# Patient Record
Sex: Female | Born: 1935
Health system: Southern US, Community
[De-identification: ages and names within clinical notes are randomized; demographics above are authoritative.]

## PROBLEM LIST (undated history)

## (undated) DIAGNOSIS — M1990 Unspecified osteoarthritis, unspecified site: Secondary | ICD-10-CM

## (undated) DIAGNOSIS — E875 Hyperkalemia: Secondary | ICD-10-CM

## (undated) DIAGNOSIS — R0602 Shortness of breath: Secondary | ICD-10-CM

## (undated) DIAGNOSIS — N318 Other neuromuscular dysfunction of bladder: Secondary | ICD-10-CM

## (undated) DIAGNOSIS — I10 Essential (primary) hypertension: Secondary | ICD-10-CM

## (undated) DIAGNOSIS — B029 Zoster without complications: Secondary | ICD-10-CM

## (undated) DIAGNOSIS — R42 Dizziness and giddiness: Secondary | ICD-10-CM

## (undated) DIAGNOSIS — R32 Unspecified urinary incontinence: Secondary | ICD-10-CM

## (undated) DIAGNOSIS — K219 Gastro-esophageal reflux disease without esophagitis: Secondary | ICD-10-CM

## (undated) DIAGNOSIS — IMO0002 Reserved for concepts with insufficient information to code with codable children: Secondary | ICD-10-CM

## (undated) DIAGNOSIS — G619 Inflammatory polyneuropathy, unspecified: Secondary | ICD-10-CM

## (undated) DIAGNOSIS — M545 Low back pain, unspecified: Secondary | ICD-10-CM

## (undated) DIAGNOSIS — E079 Disorder of thyroid, unspecified: Secondary | ICD-10-CM

## (undated) DIAGNOSIS — I428 Other cardiomyopathies: Secondary | ICD-10-CM

## (undated) DIAGNOSIS — Z95810 Presence of automatic (implantable) cardiac defibrillator: Secondary | ICD-10-CM

## (undated) DIAGNOSIS — E119 Type 2 diabetes mellitus without complications: Secondary | ICD-10-CM

## (undated) DIAGNOSIS — G9050 Complex regional pain syndrome I, unspecified: Secondary | ICD-10-CM

## (undated) DIAGNOSIS — G8929 Other chronic pain: Secondary | ICD-10-CM

## (undated) DIAGNOSIS — I639 Cerebral infarction, unspecified: Secondary | ICD-10-CM

## (undated) DIAGNOSIS — E785 Hyperlipidemia, unspecified: Secondary | ICD-10-CM

## (undated) DIAGNOSIS — N183 Chronic kidney disease, stage 3 unspecified: Secondary | ICD-10-CM

## (undated) DIAGNOSIS — I447 Left bundle-branch block, unspecified: Secondary | ICD-10-CM

## (undated) DIAGNOSIS — N3281 Overactive bladder: Secondary | ICD-10-CM

## (undated) DIAGNOSIS — G622 Polyneuropathy due to other toxic agents: Secondary | ICD-10-CM

## (undated) DIAGNOSIS — G43909 Migraine, unspecified, not intractable, without status migrainosus: Secondary | ICD-10-CM

## (undated) DIAGNOSIS — R9430 Abnormal result of cardiovascular function study, unspecified: Secondary | ICD-10-CM

## (undated) DIAGNOSIS — E049 Nontoxic goiter, unspecified: Secondary | ICD-10-CM

## (undated) DIAGNOSIS — K635 Polyp of colon: Secondary | ICD-10-CM

## (undated) DIAGNOSIS — C4491 Basal cell carcinoma of skin, unspecified: Secondary | ICD-10-CM

## (undated) DIAGNOSIS — R4701 Aphasia: Secondary | ICD-10-CM

## (undated) HISTORY — DX: Hyperlipidemia, unspecified: E78.5

## (undated) HISTORY — DX: Polyp of colon: K63.5

## (undated) HISTORY — DX: Hyperkalemia: E87.5

## (undated) HISTORY — DX: Inflammatory polyneuropathy, unspecified: G61.9

## (undated) HISTORY — DX: Presence of automatic (implantable) cardiac defibrillator: Z95.810

## (undated) HISTORY — DX: Other cardiomyopathies: I42.8

## (undated) HISTORY — DX: Cerebral infarction, unspecified: I63.9

## (undated) HISTORY — DX: Unspecified osteoarthritis, unspecified site: M19.90

## (undated) HISTORY — DX: Complex regional pain syndrome I, unspecified: G90.50

## (undated) HISTORY — DX: Zoster without complications: B02.9

## (undated) HISTORY — DX: Unspecified urinary incontinence: R32

## (undated) HISTORY — DX: Disorder of thyroid, unspecified: E07.9

## (undated) HISTORY — DX: Reserved for concepts with insufficient information to code with codable children: IMO0002

## (undated) HISTORY — PX: FINGER SURGERY: SHX640

## (undated) HISTORY — DX: Polyneuropathy due to other toxic agents: G62.2

## (undated) HISTORY — DX: Gastro-esophageal reflux disease without esophagitis: K21.9

## (undated) HISTORY — PX: CATARACT EXTRACTION W/ INTRAOCULAR LENS IMPLANT: SHX1309

## (undated) HISTORY — PX: UPPER GASTROINTESTINAL ENDOSCOPY: SHX188

## (undated) HISTORY — DX: Nontoxic goiter, unspecified: E04.9

## (undated) HISTORY — DX: Essential (primary) hypertension: I10

## (undated) HISTORY — PX: CARDIAC CATHETERIZATION: SHX172

## (undated) HISTORY — DX: Other neuromuscular dysfunction of bladder: N31.8

## (undated) HISTORY — PX: BACK SURGERY: SHX140

---

## 1957-06-03 HISTORY — PX: BREAST CYST EXCISION: SHX579

## 1974-06-03 HISTORY — PX: TUBAL LIGATION: SHX77

## 1987-06-04 HISTORY — PX: KIDNEY DONATION: SHX685

## 1997-06-03 HISTORY — PX: POSTERIOR LAMINECTOMY / DECOMPRESSION CERVICAL SPINE: SUR739

## 1997-06-03 HISTORY — PX: LUMBAR LAMINECTOMY/DECOMPRESSION MICRODISCECTOMY: SHX5026

## 1998-05-11 ENCOUNTER — Ambulatory Visit (HOSPITAL_COMMUNITY): Admission: RE | Admit: 1998-05-11 | Discharge: 1998-05-11 | Payer: Self-pay | Admitting: Plastic Surgery

## 1999-05-07 ENCOUNTER — Ambulatory Visit (HOSPITAL_COMMUNITY): Admission: RE | Admit: 1999-05-07 | Discharge: 1999-05-07 | Payer: Self-pay | Admitting: *Deleted

## 1999-05-07 ENCOUNTER — Encounter: Payer: Self-pay | Admitting: *Deleted

## 2000-07-14 ENCOUNTER — Ambulatory Visit (HOSPITAL_COMMUNITY): Admission: RE | Admit: 2000-07-14 | Discharge: 2000-07-14 | Payer: Self-pay | Admitting: *Deleted

## 2000-07-14 ENCOUNTER — Encounter: Payer: Self-pay | Admitting: *Deleted

## 2001-10-01 ENCOUNTER — Ambulatory Visit (HOSPITAL_COMMUNITY): Admission: RE | Admit: 2001-10-01 | Discharge: 2001-10-01 | Payer: Self-pay | Admitting: *Deleted

## 2001-10-01 ENCOUNTER — Encounter: Payer: Self-pay | Admitting: *Deleted

## 2001-10-01 LAB — HM MAMMOGRAPHY: HM Mammogram: NORMAL

## 2009-12-01 ENCOUNTER — Encounter: Payer: Self-pay | Admitting: Endocrinology

## 2009-12-01 LAB — HM PAP SMEAR: HM Pap smear: NORMAL

## 2009-12-01 LAB — CONVERTED CEMR LAB: Pap Smear: NORMAL

## 2009-12-18 ENCOUNTER — Encounter: Payer: Self-pay | Admitting: Endocrinology

## 2010-06-15 ENCOUNTER — Encounter: Payer: Self-pay | Admitting: Endocrinology

## 2010-06-26 ENCOUNTER — Ambulatory Visit: Admit: 2010-06-26 | Payer: Self-pay | Admitting: Internal Medicine

## 2010-07-09 ENCOUNTER — Encounter: Payer: Self-pay | Admitting: Endocrinology

## 2010-07-09 ENCOUNTER — Ambulatory Visit (INDEPENDENT_AMBULATORY_CARE_PROVIDER_SITE_OTHER): Payer: Medicare Other | Admitting: Endocrinology

## 2010-07-09 DIAGNOSIS — E119 Type 2 diabetes mellitus without complications: Secondary | ICD-10-CM

## 2010-07-09 DIAGNOSIS — E1169 Type 2 diabetes mellitus with other specified complication: Secondary | ICD-10-CM | POA: Insufficient documentation

## 2010-07-09 DIAGNOSIS — N318 Other neuromuscular dysfunction of bladder: Secondary | ICD-10-CM | POA: Insufficient documentation

## 2010-07-09 DIAGNOSIS — I10 Essential (primary) hypertension: Secondary | ICD-10-CM | POA: Insufficient documentation

## 2010-07-09 DIAGNOSIS — G9050 Complex regional pain syndrome I, unspecified: Secondary | ICD-10-CM | POA: Insufficient documentation

## 2010-07-09 DIAGNOSIS — I428 Other cardiomyopathies: Secondary | ICD-10-CM | POA: Insufficient documentation

## 2010-07-09 DIAGNOSIS — E1129 Type 2 diabetes mellitus with other diabetic kidney complication: Secondary | ICD-10-CM | POA: Insufficient documentation

## 2010-07-09 DIAGNOSIS — G629 Polyneuropathy, unspecified: Secondary | ICD-10-CM | POA: Insufficient documentation

## 2010-07-09 DIAGNOSIS — E7800 Pure hypercholesterolemia, unspecified: Secondary | ICD-10-CM | POA: Insufficient documentation

## 2010-07-09 DIAGNOSIS — E875 Hyperkalemia: Secondary | ICD-10-CM | POA: Insufficient documentation

## 2010-07-19 NOTE — Assessment & Plan Note (Signed)
Summary: New [place]/Medicare/AARP/DM2  #LB   Vital Signs:  Patient profile:   76 year old female Menstrual status:  postmenopausal Height:      63 inches (160.02 cm) Weight:      164 pounds (74.55 kg) BMI:     29.16 O2 Sat:      97 % on Room air Temp:     97.9 degrees F (36.61 degrees C) oral Pulse rate:   67 / minute Pulse rhythm:   regular BP sitting:   128 / 76  (left arm) Cuff size:   large  Vitals Entered By: Rebeca Alert CMA Deborra Medina) (July 09, 2010 2:45 PM)  O2 Flow:  Room air  CC: New Endo/DMII/pt is in process of estab. with new PCP (Dr. Evalee Mutton) Is Patient Diabetic? Yes  Vision Screening:      Vision Comments: Last DM eye exam was at [facility] in 06/2010  Vision Entered By: Rebeca Alert CMA Deborra Medina) (July 09, 2010 2:54 PM)     Menstrual Status postmenopausal Last PAP Result normal   Primary Provider:  [facility]  CC:  New Endo/DMII/pt is in process of estab. with new PCP (Dr. Evalee Mutton).  History of Present Illness: pt states 15 years h/o dm.   it is complicated by peripheral sensory neuropathy.  she was been on insulin, for a brief period, a few years ago, but not since then.  she takes 2 oral agents.  no cbg record, but states cbg's vary from 110-180. pt says his diet and exercise are "fair."   symptomatically, pt states few years of moderate numbness of the hands and feet, and assoc pain.     Current Medications (verified): 1)  Metformin Hcl 500 Mg Xr24h-Tab (Metformin Hcl) .Marland Kitchen.. 1 Tablet By Mouth Four Times A Day 2)  Glimepiride 2 Mg Tabs (Glimepiride) .Marland Kitchen.. 1 Tablet By Mouth Once Daily 3)  Carvedilol 12.5 Mg Tabs (Carvedilol) .Marland Kitchen.. 1 Tablet By Mouth Two Times A Day 4)  Oxybutynin Chloride 5 Mg Tabs (Oxybutynin Chloride) .Marland Kitchen.. 1 Tablet By Mouth Once Daily 5)  Gabapentin 300 Mg Caps (Gabapentin) .Marland Kitchen.. 1 Capsule By Mouth Three Times A Day 6)  Simvastatin 40 Mg Tabs (Simvastatin) .Marland Kitchen.. 1 Tablet By Mouth Once Daily 7)  Zantac 150 Mg Tabs (Ranitidine  Hcl) .Marland Kitchen.. 1 Tablet By Mouth Once Daily 8)  Aspirin 81 Mg Tabs (Aspirin) .Marland Kitchen.. 1 Tablet By Mouth Once Daily 9)  Vitamin D3 1000 Unit Caps (Cholecalciferol) .Marland Kitchen.. 1 Capsule By Mouth Once Daily 10)  Vitamin B-12 100 Mcg Tabs (Cyanocobalamin) .Marland Kitchen.. 1 By Mouth Once Daily  Allergies (verified): 1)  ! Ace Inhibitors 2)  ! Codeine  Past History:  Past Medical History: CARDIOMYOPATHY (ICD-425.4) HYPERKALEMIA (ICD-276.7) POLYNEUROPATHY (ICD-357.9) REFLEX SYMPATHETIC DYSTROPHY (ICD-337.20) OVERACTIVE BLADDER (ICD-596.51) HYPERCHOLESTEROLEMIA (ICD-272.0) HYPERTENSION (ICD-401.9) DM (ICD-250.00) FAMILY HISTORY OF ALCOHOLISM/ADDICTION (ICD-V61.41)  Past Surgical History: Breast Surgery: Cyst-1959 Finger Surgery: 1959, 1976 Kidney Donor: 1989 Back Surgery: 1999  Family History: Reviewed history and no changes required. Family History of Alcoholism/Addiction (parent) Family History High cholesterol Family History Hypertension (parent) Family History Kidney disease Family History Lung cancer Family History Ovarian cancer (mother) Family History of Breast Cancer Family History of Diabetes (3 sibs, father, dtr)  Social History: Reviewed history and no changes required. Retired married Never Smoked Alcohol use-yes Drug use-no Regular exercise-no Smoking Status:  never Drug Use:  no Does Patient Exercise:  no  Review of Systems       denies weight loss, headache, chest pain,  sob, n/v, excessive diaphoresis, memory loss, and depression.  she has blurry vision,  but nature of opthal condition is uncertain.  she has polyuria, and leg cramps.  she has hypoglycemia approx 2/month--usually in the afternoon.  she has easy bruising and rhinorrhea.   Physical Exam  General:  normal appearance.   Head:  head: no deformity eyes: no periorbital swelling, no proptosis external nose and ears are normal mouth: no lesion seen Neck:  Supple without thyroid enlargement or tenderness.  there is a  healed surgical scar at the posterior neck Lungs:  Clear to auscultation bilaterally. Normal respiratory effort.  Heart:  Regular rate and rhythm without murmurs or gallops noted. Normal S1,S2.   Abdomen:  abdomen is soft, nontender.  no hepatosplenomegaly.   not distended.  no hernia  Msk:  muscle bulk and strength are grossly normal.  no obvious joint swelling.  gait is normal and steady  Pulses:  dorsalis pedis intact bilat.  there is a right carotid bruit (pt states this is old)  Extremities:  no deformity.  no ulcer on the feet.  feet are of normal color and temp.  no edema  Neurologic:  cn 2-12 grossly intact.   readily moves all 4's.   sensation is intact to touch on the feet, but decreased from normal.  Skin:  normal texture and temp.  no rash.  not diaphoretic  Cervical Nodes:  No significant adenopathy.  Psych:  Alert and cooperative; normal mood and affect; normal attention span and concentration.   Additional Exam:  outside test results are reviewed:  a1c=8.1   Impression & Recommendations:  Problem # 1:  DM (ICD-250.00) due to hypoglycemia, she needs some adjustment in her therapy  Problem # 2:  POLYNEUROPATHY (ICD-357.9) prob due to #1  Problem # 3:  leg cramps poss. due to #1  Medications Added to Medication List This Visit: 1)  Metformin Hcl 500 Mg Xr24h-tab (Metformin hcl) .Marland Kitchen.. 1 tablet by mouth four times a day 2)  Glimepiride 2 Mg Tabs (Glimepiride) .Marland Kitchen.. 1 tablet by mouth once daily 3)  Carvedilol 12.5 Mg Tabs (Carvedilol) .Marland Kitchen.. 1 tablet by mouth two times a day 4)  Oxybutynin Chloride 5 Mg Tabs (Oxybutynin chloride) .Marland Kitchen.. 1 tablet by mouth once daily 5)  Gabapentin 300 Mg Caps (Gabapentin) .Marland Kitchen.. 1 capsule by mouth three times a day 6)  Simvastatin 40 Mg Tabs (Simvastatin) .Marland Kitchen.. 1 tablet by mouth once daily 7)  Zantac 150 Mg Tabs (Ranitidine hcl) .Marland Kitchen.. 1 tablet by mouth once daily 8)  Aspirin 81 Mg Tabs (Aspirin) .Marland Kitchen.. 1 tablet by mouth once daily 9)  Vitamin  D3 1000 Unit Caps (Cholecalciferol) .Marland Kitchen.. 1 capsule by mouth once daily 10)  Vitamin B-12 100 Mcg Tabs (Cyanocobalamin) .Marland Kitchen.. 1 by mouth once daily  Other Orders: Est. Patient Level V [id number])  Patient Instructions: 1)  good diet and exercise habits significanly improve the control of your diabetes.  please let me know if you wish to be referred to a dietician.  high blood sugar is very risky to your health.  you should see an eye doctor every year. 2)  controlling your blood pressure and cholesterol drastically reduces the damage diabetes does to your body.  this also applies to quitting smoking.  please discuss these with your doctor.  you should take an aspirin every day, unless you have been advised by a doctor not to. 3)  check your blood sugar 1 time a day.  vary the time of  day when you check, between before the 3 meals, and at bedtime.  also check if you have symptoms of your blood sugar being too high or too low.  please keep a record of the readings and bring it to your next appointment here.  please call us sooner if you are having low blood sugar episodes. 4)  you should consider reducing glimepiride, and adding januvia and actos.  please let me know.   Orders Added: 1)  Est. Patient Level V [id number]     Preventive Care Screening  Colonoscopy:    Date:  06/04/2007    Next Due:  06/2010    Results:  done   Mammogram:    Date:  02/01/2010    Results:  normal   Pap Smear:    Date:  12/01/2009    Results:  normal    Preventive Care Screening  Colonoscopy:    Date:  06/04/2007    Next Due:  06/2010    Results:  done   Mammogram:    Date:  02/01/2010    Results:  normal   Pap Smear:    Date:  12/01/2009    Results:  normal

## 2010-08-02 HISTORY — PX: CT SCAN: SHX5351

## 2010-08-28 ENCOUNTER — Encounter: Payer: Self-pay | Admitting: Family Medicine

## 2010-08-28 ENCOUNTER — Ambulatory Visit (INDEPENDENT_AMBULATORY_CARE_PROVIDER_SITE_OTHER): Payer: Medicare Other | Admitting: Family Medicine

## 2010-08-28 DIAGNOSIS — K760 Fatty (change of) liver, not elsewhere classified: Secondary | ICD-10-CM

## 2010-08-28 DIAGNOSIS — E119 Type 2 diabetes mellitus without complications: Secondary | ICD-10-CM

## 2010-08-28 DIAGNOSIS — I428 Other cardiomyopathies: Secondary | ICD-10-CM

## 2010-08-28 DIAGNOSIS — E7800 Pure hypercholesterolemia, unspecified: Secondary | ICD-10-CM

## 2010-08-28 DIAGNOSIS — E875 Hyperkalemia: Secondary | ICD-10-CM

## 2010-08-28 DIAGNOSIS — I10 Essential (primary) hypertension: Secondary | ICD-10-CM

## 2010-08-28 DIAGNOSIS — E785 Hyperlipidemia, unspecified: Secondary | ICD-10-CM

## 2010-08-28 DIAGNOSIS — K7689 Other specified diseases of liver: Secondary | ICD-10-CM

## 2010-08-28 MED ORDER — PANTOPRAZOLE SODIUM 40 MG PO TBEC: 40.0000 mg | DELAYED_RELEASE_TABLET | Freq: Every day | ORAL | Status: DC

## 2010-08-28 MED ORDER — AMLODIPINE BESYLATE 5 MG PO TABS: 5.0000 mg | ORAL_TABLET | Freq: Every day | ORAL | Status: DC

## 2010-08-28 NOTE — Assessment & Plan Note (Signed)
Pt told this years ago  No symptoms  No elevation  In liver enzymes Disc wt loss and low fat diet

## 2010-08-28 NOTE — Assessment & Plan Note (Signed)
Intermittent in past Disc watching K in diet  Has one kidney  Has DM Re check with next labs

## 2010-08-28 NOTE — Progress Notes (Signed)
Subjective:    Patient ID: Morgan Hubbard, female    DOB: 1934-07-11, 76 y.o.   MRN: [id number]  HPI Here to est as new primary care pt  Has complex med hx  Moved here from [place] - 2 hours away - getting est   Sees Dr Loanne Drilling for DM  [facility] is still over 8  Has not been to DM teaching - but knows how to follow DM diet Not entirely compliant  Needs to loose wt and exercise     HTN very well controlled on current meds  Amlodipine was added to her medicines recently , also on carvedilol  Had to come off ace inhibitor -- true allergy and has to stay off that class of med     Hyperlipidemia - been on simvastatin - and changed to pravastatin due to amlodipine new px (cannot mix them) Has not had labs yet for chol since then  Has only been on pravastatin for about 10 days  Was well controlled on simvastatin -- LDL 86 No side eff on new meds    Cardiomyopathy--low EF (pt does not know reason)  Heart cath planned for thurs in [place]  Was at beach and went to hosp several weeks ago -- with cp and jaw pain  Did stress test and echo -- some new area of ischemia   Also borderline high K in past - will need to re check this in future  Last cath was 6 years ago --was clean   Has one kidney- donated to her brother   Had colonosc 6 years ago - showed polyps - adenomatous , then repeated 3 years later ,- told her to come back in 3 more years  She wants to do that in [place] - Dr Debarah Crape   Her last yearly check up was in July   Has hx of fatty liver -- by bx in 1989 -- when she donated kidney   Has a place to check on her abd  Old CT showed pulmonary nodule in past   Past Medical History  Diagnosis Date  . Arthritis   . Diabetes mellitus   . Headache   . GERD (gastroesophageal reflux disease)   . Ulcer   . Hyperlipidemia   . Migraine   . Colon polyps   . Thyroid disease   . Shingles   . Urine incontinence    Past Surgical History  Procedure Date  . Breast surgery 19599   breast cyst  . Finger surgery [place]  . Tubal ligation 1976  . Kidney donation 55  . Spine surgery 1999   History   Social History  . Marital Status: Married    Spouse Name: N/A    Number of Children: 2  . Years of Education: N/A   Occupational History  . retired - Production manager     Social History Main Topics  . Smoking status: Never Smoker   . Smokeless tobacco: None  . Alcohol Use: No  . Drug Use: None  . Sexually Active: None   Other Topics Concern  . None   Social History Narrative   Married 2nd timeDaughter is Surveyor, minerals     Family History  Problem Relation Age of Onset  . Arthritis Mother   . Cancer Mother     uterine and mouth  . Hyperlipidemia Mother   . Stroke Mother   . Hypertension Mother   . Alcohol abuse Father   . Cancer Sister  breast  . Diabetes Sister   . Cancer Brother     lung cancer  . Kidney disease Brother   . Diabetes Brother   . Hyperlipidemia Sister   . Heart disease Sister   . Hypertension Sister   . Diabetes Sister   . Hyperlipidemia Brother   . Kidney failure Brother       Review of Systems  Constitutional: Negative for activity change, appetite change and unexpected weight change.  HENT: Negative for nosebleeds and congestion.   Eyes: Negative for pain and visual disturbance.  Respiratory: Negative for apnea, cough, shortness of breath and wheezing.   Cardiovascular: Negative for chest pain, palpitations and leg swelling.  Gastrointestinal: Negative for abdominal pain.  Genitourinary: Negative for frequency and hematuria.  Musculoskeletal: Negative for myalgias and joint swelling.  Skin: Negative for color change and pallor.  Neurological: Negative for weakness, light-headedness and headaches.  Hematological: Negative for adenopathy. Does not bruise/bleed easily.  Psychiatric/Behavioral: Negative for dysphoric mood.       Objective:   Physical Exam  Constitutional: She appears well-developed and  well-nourished.       overwt and well appearing   HENT:  Head: Normocephalic and atraumatic.  Right Ear: External ear normal.  Left Ear: External ear normal.  Mouth/Throat: Oropharynx is clear and moist.  Eyes: Conjunctivae and EOM are normal. Pupils are equal, round, and reactive to light.  Neck: Normal range of motion. Neck supple. No JVD present. Carotid bruit is present. No thyromegaly present.  Cardiovascular: Normal rate and regular rhythm.  Exam reveals no gallop and no friction rub.   Murmur heard. Pulmonary/Chest: Effort normal and breath sounds normal. She has no rales.  Abdominal: Soft. Bowel sounds are normal. She exhibits no abdominal bruit.       Some assymetry in L side of abd under her nephrectomy scar nt No hernia appreciated   Musculoskeletal: She exhibits no edema and no tenderness.       Chronic neck pain from deg disc and prior surgery    Lymphadenopathy:    She has no cervical adenopathy.  Neurological: She is alert. She has normal reflexes. Coordination normal.  Skin: Skin is warm and dry. No rash noted.  Psychiatric: She has a normal mood and affect.          Assessment & Plan:

## 2010-08-28 NOTE — Assessment & Plan Note (Signed)
Not well controlled She will be f/u with Dr Loanne Drilling- she is interested in long acting insulin Disc imp of DM diet and wt loss

## 2010-08-28 NOTE — Assessment & Plan Note (Signed)
Newly on pravastatin Labs in 6 weeks  Disc low sat fat diet in detail

## 2010-08-28 NOTE — Patient Instructions (Signed)
Here are new px  Please make appt with Dr Loanne Drilling for diabetes after you have your heart cath  Schedule fasting labs in 6 weeks for cholesterol and potassium  We will schedule 30 minute check up in august  Don't forget to schedule your 3 year colonoscopy in [place] a diabetic diet  Eat low saturated fat diet (Avoid red meat/ fried foods/ egg yolks/ fatty breakfast meats/ butter, cheese and high fat dairy/ and shellfish  ) Start regular exercise after your heart is cleared  Work on weight loss

## 2010-08-28 NOTE — Assessment & Plan Note (Signed)
Unknown cause Heart M heard on exam Upcoming cath in [place] after episode of cp recently (now resolved)

## 2010-08-28 NOTE — Assessment & Plan Note (Signed)
Well controlled with current meds 124/66 Pt is allergic to ace - cannot have that or ARB

## 2010-08-30 ENCOUNTER — Encounter: Payer: Self-pay | Admitting: Family Medicine

## 2010-09-05 ENCOUNTER — Encounter: Payer: Self-pay | Admitting: Family Medicine

## 2010-09-07 ENCOUNTER — Encounter: Payer: Self-pay | Admitting: Family Medicine

## 2010-10-01 ENCOUNTER — Encounter: Payer: Self-pay | Admitting: Family Medicine

## 2010-10-15 ENCOUNTER — Other Ambulatory Visit (INDEPENDENT_AMBULATORY_CARE_PROVIDER_SITE_OTHER): Payer: Medicare Other | Admitting: Family Medicine

## 2010-10-15 DIAGNOSIS — E875 Hyperkalemia: Secondary | ICD-10-CM

## 2010-10-15 DIAGNOSIS — E785 Hyperlipidemia, unspecified: Secondary | ICD-10-CM

## 2010-10-15 LAB — LIPID PANEL
Cholesterol: 179 mg/dL (ref 0–200)
HDL: 44.9 mg/dL (ref >39.00)
VLDL: 29.6 mg/dL (ref 0.0–40.0)

## 2010-10-18 ENCOUNTER — Telehealth: Payer: Self-pay

## 2010-10-18 NOTE — Telephone Encounter (Signed)
Message copied by Ozzie Hoyle on Thu Oct 18, 2010  5:44 PM ------      Message from: [name]      Created: Tue Oct 16, 2010  7:50 AM       Cholesterol is fairly controlled with pravastatin and diet       K is high-- is she taking any K or vitamins with K ?

## 2010-10-18 NOTE — Telephone Encounter (Signed)
Left message for pt to call back  °

## 2010-10-19 NOTE — Telephone Encounter (Signed)
Patient notified as instructed by telephone. Pt said she does not take any add'l supplements and doesn't think her BMD  Vitamin has K in it. Pt has been eating a lot of bananas and greens for awhile. Pt said she had been told before her K was high and when she would stop bananas it was OK. Pt had cardiac cath 4-6 weeks ago in [place] and was told her K was not high then. Pt has a business call and will be going out of town next week, so pt said to leave any message on home v/m. Just for Dr Marliss Coots information pt does have colonoscopy and endoscopy scheduled in [place] on 12/12/10.

## 2010-10-19 NOTE — Telephone Encounter (Signed)
Left message for pt to call back  °

## 2010-10-21 NOTE — Telephone Encounter (Signed)
Hold off on bananas and avoid vitamins Re check K for hyperkalemia when she is back in town please

## 2010-10-22 NOTE — Telephone Encounter (Signed)
Patient notified as instructed by telephone. Pt not sure how long will be out of town and will call for appt on her return. Pt requested copy of labs faxed to Dr Hall Busing cardiologist [phone] fax # - done.

## 2010-11-15 ENCOUNTER — Ambulatory Visit (INDEPENDENT_AMBULATORY_CARE_PROVIDER_SITE_OTHER): Payer: Medicare Other | Admitting: Family Medicine

## 2010-11-15 ENCOUNTER — Ambulatory Visit (INDEPENDENT_AMBULATORY_CARE_PROVIDER_SITE_OTHER)
Admission: RE | Admit: 2010-11-15 | Discharge: 2010-11-15 | Disposition: A | Discharge status: Home / Self Care | Payer: Medicare Other | Source: Ambulatory Visit | Attending: Cardiology | Admitting: Cardiology

## 2010-11-15 ENCOUNTER — Encounter: Payer: Self-pay | Admitting: Family Medicine

## 2010-11-15 VITALS — BP 130/72 | HR 68 | Temp 97.9°F | Ht 61.5 in | Wt 159.8 lb

## 2010-11-15 DIAGNOSIS — R109 Unspecified abdominal pain: Secondary | ICD-10-CM | POA: Insufficient documentation

## 2010-11-15 MED ORDER — METFORMIN HCL ER 500 MG PO TB24: ORAL_TABLET | ORAL | Status: DC

## 2010-11-15 MED ORDER — OXYBUTYNIN CHLORIDE 5 MG PO TABS: 5.0000 mg | ORAL_TABLET | Freq: Every day | ORAL | Status: DC

## 2010-11-15 MED ORDER — GABAPENTIN 300 MG PO CAPS: 300.0000 mg | ORAL_CAPSULE | Freq: Three times a day (TID) | ORAL | Status: DC

## 2010-11-15 NOTE — Patient Instructions (Signed)
Please stop by to see Rosaria Ferries on your way out. If your symptoms worsen, please call us or go to the ER immediately.

## 2010-11-15 NOTE — Progress Notes (Signed)
Subjective:    Patient ID: Morgan Hubbard, female    DOB: 19-Jul-1934, 76 y.o.   MRN: [id number]  HPI 76 yo pt with h/o HTN, DM, ?cardiomyopathy, of Dr. Marliss Coots here for left side pain.   S/p left nephrectomy in 1989- donated to her brother.    Past several months, has had lingering, aching on left lower quadrant of her abdomen that radiates to her back. Just went on a 5000 mile trip out [place], feels symptoms have acutely worsened over past few days. No nausea, vomiting, diarrhea, constipation, diarrhea. No fevers. No dysuria or hematuria. Has colonoscopy scheduled later this summer.      Past Medical History  Diagnosis Date  . Arthritis   . Diabetes mellitus   . Headache   . GERD (gastroesophageal reflux disease)   . Ulcer   . Hyperlipidemia   . Migraine   . Colon polyps   . Thyroid disease   . Shingles   . Urine incontinence    Past Surgical History  Procedure Date  . Breast surgery 19599    breast cyst  . Finger surgery [place]  . Tubal ligation 1976  . Kidney donation 50  . Spine surgery 1999  . Ct scan 3/12    outside hosp- lung nodule and gallstones   History   Social History  . Marital Status: Married    Spouse Name: N/A    Number of Children: 2  . Years of Education: N/A   Occupational History  . retired - Production manager     Social History Main Topics  . Smoking status: Never Smoker   . Smokeless tobacco: Not on file  . Alcohol Use: No  . Drug Use: Not on file  . Sexually Active: Not on file   Other Topics Concern  . Not on file   Social History Narrative   Married 2nd timeDaughter is Surveyor, minerals     Family History  Problem Relation Age of Onset  . Arthritis Mother   . Cancer Mother     uterine and mouth  . Hyperlipidemia Mother   . Stroke Mother   . Hypertension Mother   . Alcohol abuse Father   . Cancer Sister     breast  . Diabetes Sister   . Cancer Brother     lung cancer  . Kidney disease Brother   . Diabetes Brother     . Hyperlipidemia Sister   . Heart disease Sister   . Hypertension Sister   . Diabetes Sister   . Hyperlipidemia Brother   . Kidney failure Brother     The PMH, PSH, Social History, Family History, Medications, and allergies have been reviewed in [facility], and have been updated if relevant.   Review of Systems  See HPI      Objective:   Physical Exam  BP 130/72  Pulse 68  Temp(Src) 97.9 F (36.6 C) (Oral)  Ht 5' 1.5" (1.562 m)  Wt 159 lb 12 oz (72.462 kg)  BMI 29.70 kg/m2  LMP 06/03/1992  Constitutional: She appears well-developed and well-nourished.       overwt and well appearing   HENT:  Head: Normocephalic and atraumatic.  Right Ear: External ear normal.  Left Ear: External ear normal.  Mouth/Throat: Oropharynx is clear and moist.  Eyes: Conjunctivae and EOM are normal. Pupils are equal, round, and reactive to light.  Abdominal: Soft. Bowel sounds are normal. She exhibits no abdominal bruit. No rebound, no guarding.  Some assymetry in L side of abd under her nephrectomy scar nt No hernia appreciated    Lymphadenopathy:    She has no cervical adenopathy.  Neurological: She is alert. She has normal reflexes. Coordination normal.  Skin: Skin is warm and dry. No rash noted.  Psychiatric: She has a normal mood and affect.          Assessment & Plan:   1. Left sided abdominal pain  CT Abdomen Pelvis W Wo Contrast   Deteriorated. Etiology unknown. No red flag symptoms. Given her history of nephrectomy and worsening pain, will get abdominal CT without contrast. The patient indicates understanding of these issues and agrees with the plan.

## 2010-11-16 ENCOUNTER — Other Ambulatory Visit: Payer: Self-pay | Admitting: Family Medicine

## 2010-11-16 MED ORDER — TRAMADOL HCL 50 MG PO TABS: 50.0000 mg | ORAL_TABLET | Freq: Four times a day (QID) | ORAL | Status: AC | PRN

## 2010-11-30 ENCOUNTER — Telehealth: Payer: Self-pay | Admitting: *Deleted

## 2010-11-30 DIAGNOSIS — R220 Localized swelling, mass and lump, head: Secondary | ICD-10-CM

## 2010-11-30 NOTE — Telephone Encounter (Signed)
Patient called and stated that her tongue is swelling, started about an hour ago, but has gotten larger. She isn't having any sob or any other symptoms. I advised her to go to ER or Urgent care. She agreed said this has happened on 3 different occasions and would like to go to an allergist to see what is causing it. She is asking if you would be willing to do referral. I told her that I would send you a note about the referral and they we would call her back, but she needs to go to ER or UC to have this addressed immediately. She said that she would.

## 2010-12-01 DIAGNOSIS — R220 Localized swelling, mass and lump, head: Secondary | ICD-10-CM | POA: Insufficient documentation

## 2010-12-01 NOTE — Telephone Encounter (Signed)
I agree she needs to seek care now- thanks for telling her that  I will go ahead with allergist referral

## 2010-12-02 HISTORY — PX: COLONOSCOPY: SHX174

## 2010-12-03 LAB — HM DIABETES EYE EXAM: HM Diabetic Eye Exam: NORMAL

## 2010-12-03 NOTE — Telephone Encounter (Signed)
Patient notified as instructed by telephone. Pt said she did go to urgent care on Fri and was given a shot and is doing fine now. Pt said she would like to see allergist in [place]. Pt said to make appt after 12/13/10 for [place] beach now. Pt can be reached cell [phone]. Pt will wait to hear from pt care coordinator about appt.

## 2010-12-03 NOTE — Telephone Encounter (Signed)
Appt scheduled

## 2010-12-07 ENCOUNTER — Telehealth: Payer: Self-pay | Admitting: *Deleted

## 2010-12-07 NOTE — Telephone Encounter (Signed)
Pt states that she is having Colonoscopy/Endoscopy in [place] next Wednesday is will be needing copy of the report for Abdominal CT scan either sent to Dr. Kristen Cardinal @ [phone] or she would be glad to come by the office and pick-up.

## 2010-12-07 NOTE — Telephone Encounter (Signed)
I printed it out to fax or send  Is in nurse IN box

## 2010-12-08 NOTE — Telephone Encounter (Signed)
Tried to fax report to Dr Geronimo Boot but did not go thru (no answer). Left message for pt to call back on 12/10/10.

## 2010-12-10 NOTE — Telephone Encounter (Signed)
Report faxed to Dr Geronimo Boot [phone] as instructed. Patient notified as instructed by telephone.

## 2010-12-14 LAB — HM COLONOSCOPY: HM Colonoscopy: NORMAL

## 2010-12-28 ENCOUNTER — Other Ambulatory Visit: Payer: Self-pay | Admitting: *Deleted

## 2010-12-28 MED ORDER — GLIMEPIRIDE 2 MG PO TABS: 2.0000 mg | ORAL_TABLET | Freq: Every day | ORAL | Status: DC

## 2010-12-31 ENCOUNTER — Other Ambulatory Visit: Payer: Self-pay | Admitting: *Deleted

## 2010-12-31 NOTE — Telephone Encounter (Signed)
Patient left message to have this refilled, but this was removed 07-09-10. I sent it in electronically, but told [facility] to hold off on filling it. Is it okay to fill.

## 2010-12-31 NOTE — Telephone Encounter (Signed)
Medication phoned to [address] [facility] as instructed. Spoke with Adonis Brook and she said just needed clarification of how pt taking med. Pt said taking Glimepiride 2 mg  One tablet twice a day. Caryl Pina had already corrected instructions in pt's chart.) Adonis Brook said rx be ready in about 45 mins. Patient notified as instructed by telephone.

## 2010-12-31 NOTE — Telephone Encounter (Signed)
It looks like this was just refilled 7/27? Also I think she sees Dr Loanne Drilling for DM as well

## 2010-12-31 NOTE — Telephone Encounter (Signed)
I called pt and verified what she had told Caryl Pina earlier that pt takes Glimepiride 2 mg taking 1 tablet twice a day. Pt said Dr [name] had been prescribing med so Adonis Brook at [facility] updated instructions for refills that were sent on 12/28/10. Thank you. Patient notified as instructed by telephone.

## 2010-12-31 NOTE — Telephone Encounter (Signed)
That clarifies why it looked to be refilled today- I got confused  I think she sees dr Loanne Drilling ? Now for her Dm so please call her to clarify what she is taking Thanks  Let me know

## 2010-12-31 NOTE — Telephone Encounter (Signed)
Opened in error

## 2011-01-14 ENCOUNTER — Encounter: Payer: Self-pay | Admitting: Family Medicine

## 2011-01-14 ENCOUNTER — Ambulatory Visit (INDEPENDENT_AMBULATORY_CARE_PROVIDER_SITE_OTHER): Payer: Medicare Other | Admitting: Family Medicine

## 2011-01-14 DIAGNOSIS — I10 Essential (primary) hypertension: Secondary | ICD-10-CM

## 2011-01-14 DIAGNOSIS — E875 Hyperkalemia: Secondary | ICD-10-CM

## 2011-01-14 DIAGNOSIS — E049 Nontoxic goiter, unspecified: Secondary | ICD-10-CM

## 2011-01-14 DIAGNOSIS — Z23 Encounter for immunization: Secondary | ICD-10-CM

## 2011-01-14 DIAGNOSIS — K760 Fatty (change of) liver, not elsewhere classified: Secondary | ICD-10-CM

## 2011-01-14 DIAGNOSIS — R221 Localized swelling, mass and lump, neck: Secondary | ICD-10-CM

## 2011-01-14 DIAGNOSIS — E119 Type 2 diabetes mellitus without complications: Secondary | ICD-10-CM

## 2011-01-14 DIAGNOSIS — E7800 Pure hypercholesterolemia, unspecified: Secondary | ICD-10-CM

## 2011-01-14 DIAGNOSIS — K7689 Other specified diseases of liver: Secondary | ICD-10-CM

## 2011-01-14 DIAGNOSIS — R220 Localized swelling, mass and lump, head: Secondary | ICD-10-CM

## 2011-01-14 MED ORDER — METFORMIN HCL ER 500 MG PO TB24: ORAL_TABLET | ORAL | Status: DC

## 2011-01-14 NOTE — Patient Instructions (Signed)
You need a Tdap vaccine -- check with health dept since medicare does not pay for that  Pneumonia vaccine today here If you are interested in shingles vaccine in future - call your insurance company to see how coverage is and call us to schedule  Don't forget to schedule your yearly mammogram in [place]  Keep follow up with Dr Loanne Drilling for diabetes to discuss treatment options  Call Dr Janee Morn office this week to see if you can get in earlier in light of frequent allergic reactions

## 2011-01-14 NOTE — Assessment & Plan Note (Signed)
Labs today Stressed importance of low fat eating and wt loss

## 2011-01-14 NOTE — Progress Notes (Signed)
Subjective:    Patient ID: Morgan Hubbard, female    DOB: Oct 31, 1934, 76 y.o.   MRN: [id number]  HPI Here for annual check up of chronic medical problems and also to review health mt list   Has appt with Dr Annamaria Boots in oct for swollen tongue  Had to take prednisone  ? What she is allergic to - no pattern  Was taken off ace - still had 3 more episodes  Also having some hoarseness   DM - seeing Dr Loanne Drilling He wanted to put her on actos  Sugars are up and down right now -- avg 141 in ams  Up today from prednisone   Is having shots in her eye - from Dr Charlane Ferretti -- for edema (prior trauma to eye) Is taking macogen and kenolog  Has had vitreous removed and several shots in eye - she is used to it  Vision is affected  No retinopathy however - which is good    Lipids on pravachol improved Lab Results  Component Value Date   CHOL 179 10/15/2010   Lab Results  Component Value Date   HDL 44.90 10/15/2010   Lab Results  Component Value Date   LDLCALC 105* 10/15/2010   Lab Results  Component Value Date   TRIG 148.0 10/15/2010   Lab Results  Component Value Date   CHOLHDL 4 10/15/2010   No results found for this basename: LDLDIRECT   diet --could do better with saturated fats  Eating a lot of vegetables - overall doing well with that  Just went on a trip and ate too much     K last was 5.4  Has had high K in past and also has 1 kidney No ace  No muscle cramping   HTN in good control 126/68 No cp or ha or sob on exertion   Last Tdap- not sure - she will have to call last physician   Last colon screen -- last colonoscopy 2 weeks ago (EGD and colonosc )  No ulcers  No polyps -- did not decide on f/u (was prev 3 years due to polyps)  GI Doctor [facility]ie in [place]    Has had shingles in the past  Never had the vaccine   Pneumovax--? When last one was before the age of 68  Thinks she needs a booster   Pap 7/11 normal  Her mother had uterine cancer   Mam 9/11 normal -  sister had breast cancer  No breast lumps (hx of fibrocystic breasts)  She will schedule her own mammogram in [place]   Patient Active Problem List  Diagnoses  . DM  . HYPERCHOLESTEROLEMIA  . HYPERKALEMIA  . REFLEX SYMPATHETIC DYSTROPHY  . POLYNEUROPATHY  . HYPERTENSION  . CARDIOMYOPATHY  . OVERACTIVE BLADDER  . Fatty liver  . Left sided abdominal pain  . Tongue swelling  . Goiter   Past Medical History  Diagnosis Date  . Arthritis   . Diabetes mellitus   . Headache   . GERD (gastroesophageal reflux disease)   . Ulcer   . Hyperlipidemia   . Migraine   . Colon polyps   . Thyroid disease   . Shingles   . Urine incontinence   . Goiter past remote    treated with RI   Past Surgical History  Procedure Date  . Breast surgery 19599    breast cyst  . Finger surgery [place]  . Tubal ligation 1976  . Kidney donation 43  .  Spine surgery 1999  . Ct scan 3/12    outside hosp- lung nodule and gallstones  . Upper gastrointestinal endoscopy   . Colonoscopy 7/12    normal (hx of polyps in past)    History  Substance Use Topics  . Smoking status: Never Smoker   . Smokeless tobacco: Not on file  . Alcohol Use: No   Family History  Problem Relation Age of Onset  . Arthritis Mother   . Cancer Mother     uterine and mouth  . Hyperlipidemia Mother   . Stroke Mother   . Hypertension Mother   . Alcohol abuse Father   . Cancer Sister     breast  . Diabetes Sister   . Cancer Brother     lung cancer  . Kidney disease Brother   . Diabetes Brother   . Hyperlipidemia Sister   . Heart disease Sister   . Hypertension Sister   . Diabetes Sister   . Hyperlipidemia Brother   . Kidney failure Brother    Allergies  Allergen Reactions  . Ace Inhibitors     REACTION: tongue swelling  . Codeine     REACTION: nausea  . Lisinopril Swelling    Swelling of tongue   Current Outpatient Prescriptions on File Prior to Visit  Medication Sig Dispense Refill  . amLODipine  (NORVASC) 5 MG tablet Take 1 tablet (5 mg total) by mouth daily.  90 tablet  3  . aspirin 81 MG tablet Take 81 mg by mouth daily.        . carvedilol (COREG) 12.5 MG tablet Take 12.5 mg by mouth 2 (two) times daily with a meal.        . Cyanocobalamin (VITAMIN B 12 PO) 1068mcg  1 tablet by mouth daily/       . gabapentin (NEURONTIN) 300 MG capsule Take 1 capsule (300 mg total) by mouth 3 (three) times daily.  240 capsule  3  . oxybutynin (DITROPAN) 5 MG tablet Take 1 tablet (5 mg total) by mouth daily.  90 tablet  3  . pantoprazole (PROTONIX) 40 MG tablet Take 1 tablet (40 mg total) by mouth daily.  90 tablet  3  . pravastatin (PRAVACHOL) 80 MG tablet Take 80 mg by mouth daily.        . traMADol (ULTRAM) 50 MG tablet Take 1 tablet (50 mg total) by mouth every 6 (six) hours as needed for pain.  20 tablet  0  . nitroGLYCERIN (NITROSTAT) 0.4 MG SL tablet As needed           Review of Systems Review of Systems  Constitutional: Negative for fever, appetite change,  and unexpected weight change. pos for occas fatigue  Eyes: Negative for pain and pos for vision change in eye that has edema  Respiratory: Negative for cough and shortness of breath.   Cardiovascular: Negative. For cp or sob or palpitatoins  Gastrointestinal: Negative for nausea, diarrhea and constipation.  Genitourinary: Negative for urgency and frequency.  Skin: Negative for pallor. or rash  Neurological: Negative for weakness, light-headedness, numbness and headaches.  Hematological: Negative for adenopathy. Does not bruise/bleed easily.  Psychiatric/Behavioral: Negative for dysphoric mood. The patient is not nervous/anxious.          Objective:   Physical Exam  Constitutional: She appears well-developed and well-nourished. No distress.       overwt and well appearing   HENT:  Head: Normocephalic and atraumatic.  Right Ear: External ear normal.  Left Ear: External ear normal.  Nose: Nose normal.  Mouth/Throat:  Oropharynx is clear and moist.  Eyes: Conjunctivae and EOM are normal. Pupils are equal, round, and reactive to light.  Neck: Normal range of motion. Neck supple. No JVD present. Carotid bruit is not present. No thyromegaly present.  Cardiovascular: Normal rate, regular rhythm, normal heart sounds and intact distal pulses.   No murmur heard. Pulmonary/Chest: Effort normal and breath sounds normal. No respiratory distress. She has no wheezes. She has no rales.  Abdominal: Soft. Bowel sounds are normal. She exhibits no distension, no abdominal bruit and no mass. There is no tenderness.  Genitourinary: Vagina normal. No breast swelling, tenderness, discharge or bleeding. No vaginal discharge found.       Bimanual pelvic exam done - no M or tenderness  Musculoskeletal: Normal range of motion. She exhibits no edema and no tenderness.  Lymphadenopathy:    She has no cervical adenopathy.  Neurological: She is alert. She has normal reflexes. No cranial nerve deficit. Coordination normal.  Skin: Skin is warm and dry. No rash noted. No erythema. No pallor.  Psychiatric: She has a normal mood and affect.          Assessment & Plan:

## 2011-01-14 NOTE — Assessment & Plan Note (Signed)
Improved on pravastatin  Would like to see LDL under 100  Disc low sat fat diet  Rev labs with pt  Will continue to follow F/u 6 mo

## 2011-01-14 NOTE — Assessment & Plan Note (Signed)
Per pt past hx of goiter with iodine tx  Nl exam today tsh today

## 2011-01-14 NOTE — Assessment & Plan Note (Signed)
No change in med Continues to hold ace  bp 126/68 today

## 2011-01-14 NOTE — Assessment & Plan Note (Signed)
Re check this today No longer on ace -- due to poss all rxn  Pt has one kidney

## 2011-01-14 NOTE — Assessment & Plan Note (Signed)
a1c today  Sugars have been labile  Enc to f/u with Dr Loanne Drilling - pt wants to disc poss of long acting basal insulin She wants to avoid actos due to heart

## 2011-01-14 NOTE — Assessment & Plan Note (Signed)
Unsure trigger - is recurrent  For visit with allergist soon

## 2011-01-15 LAB — CBC WITH DIFFERENTIAL/PLATELET
Basophils Relative: 0.1 % (ref 0.0–3.0)
Eosinophils Relative: 1.7 % (ref 0.0–5.0)
HCT: 42 % (ref 36.0–46.0)
Hemoglobin: 13.9 g/dL (ref 12.0–15.0)
Lymphocytes Relative: 18.1 % (ref 12.0–46.0)
Lymphs Abs: 2.1 10*3/uL (ref 0.7–4.0)
MCHC: 33.2 g/dL (ref 30.0–36.0)
Monocytes Relative: 4.2 % (ref 3.0–12.0)
Neutro Abs: 8.7 10*3/uL — ABNORMAL HIGH (ref 1.4–7.7)
RBC: 4.58 Mil/uL (ref 3.87–5.11)
RDW: 13.5 % (ref 11.5–14.6)

## 2011-01-15 LAB — COMPREHENSIVE METABOLIC PANEL
ALT: 21 U/L (ref 0–35)
AST: 19 U/L (ref 0–37)
Alkaline Phosphatase: 91 U/L (ref 39–117)
BUN: 17 mg/dL (ref 6–23)
Calcium: 9.7 mg/dL (ref 8.4–10.5)
Chloride: 104 mEq/L (ref 96–112)
Creatinine, Ser: 0.9 mg/dL (ref 0.4–1.2)
GFR: 64.21 mL/min (ref >60.00)
Sodium: 142 mEq/L (ref 135–145)
Total Bilirubin: 0.4 mg/dL (ref 0.3–1.2)

## 2011-01-15 LAB — TSH: TSH: 0.46 u[IU]/mL (ref 0.35–5.50)

## 2011-01-28 ENCOUNTER — Ambulatory Visit (INDEPENDENT_AMBULATORY_CARE_PROVIDER_SITE_OTHER): Payer: Medicare Other | Admitting: Family Medicine

## 2011-01-28 ENCOUNTER — Encounter: Payer: Self-pay | Admitting: Family Medicine

## 2011-01-28 VITALS — BP 120/64 | HR 76 | Temp 98.4°F | Ht 61.5 in | Wt 162.5 lb

## 2011-01-28 DIAGNOSIS — M549 Dorsalgia, unspecified: Secondary | ICD-10-CM

## 2011-01-28 DIAGNOSIS — N390 Urinary tract infection, site not specified: Secondary | ICD-10-CM | POA: Insufficient documentation

## 2011-01-28 LAB — POCT URINALYSIS DIPSTICK
Bilirubin, UA: NEGATIVE
Nitrite, UA: POSITIVE
Spec Grav, UA: 1.02
pH, UA: 5

## 2011-01-28 MED ORDER — CIPROFLOXACIN HCL 250 MG PO TABS: 250.0000 mg | ORAL_TABLET | Freq: Two times a day (BID) | ORAL | Status: DC

## 2011-01-28 NOTE — Assessment & Plan Note (Signed)
uti- first ever Pt has unilateral kidney after donation No red flags Sent for cx tx with cipro 250 bid  Disc water intake  Given handout on utis  Adv to update if worse or no improvement

## 2011-01-28 NOTE — Progress Notes (Signed)
Subjective:    Patient ID: Morgan Hubbard, female    DOB: 03/01/1935, 76 y.o.   MRN: [id number]  HPI Has never had uti before but thinks she has one now  It started one week- was out of town  Burning when she urinates and frequent urination with urgency  Some incontinence No fever Low grade headache yesterday Drinks lots of water had low back pain last week   Has R kidney only - so very careful Tries to keep up good fluid levels   Patient Active Problem List  Diagnoses  . DM  . HYPERCHOLESTEROLEMIA  . HYPERKALEMIA  . REFLEX SYMPATHETIC DYSTROPHY  . POLYNEUROPATHY  . HYPERTENSION  . CARDIOMYOPATHY  . OVERACTIVE BLADDER  . Fatty liver  . Left sided abdominal pain  . Tongue swelling  . Goiter  . UTI (lower urinary tract infection)   Past Medical History  Diagnosis Date  . Arthritis   . Diabetes mellitus   . Headache   . GERD (gastroesophageal reflux disease)   . Ulcer   . Hyperlipidemia   . Migraine   . Colon polyps   . Thyroid disease   . Shingles   . Urine incontinence   . Goiter past remote    treated with RI  . Other primary cardiomyopathies   . Hyperpotassemia   . Inflammatory and toxic neuropathy, unspecified   . Reflex sympathetic dystrophy, unspecified   . Hypertonicity of bladder   . Hypertension    Past Surgical History  Procedure Date  . Breast surgery 19599    breast cyst  . Finger surgery [place]  . Tubal ligation 1976  . Kidney donation 61  . Spine surgery 1999  . Ct scan 3/12    outside hosp- lung nodule and gallstones  . Upper gastrointestinal endoscopy   . Colonoscopy 7/12    normal (hx of polyps in past)    History  Substance Use Topics  . Smoking status: Never Smoker   . Smokeless tobacco: Not on file  . Alcohol Use: Yes   Family History  Problem Relation Age of Onset  . Arthritis Mother   . Cancer Mother     uterine and mouth  . Hyperlipidemia Mother   . Stroke Mother   . Hypertension Mother   . Alcohol abuse  Father   . Diabetes Father   . Cancer Sister     breast  . Diabetes Sister   . Cancer Brother     lung cancer  . Kidney disease Brother   . Diabetes Brother   . Hyperlipidemia Sister   . Heart disease Sister   . Hypertension Sister   . Diabetes Sister   . Hyperlipidemia Brother   . Kidney failure Brother   . Diabetes Daughter    Allergies  Allergen Reactions  . Ace Inhibitors     REACTION: tongue swelling  . Codeine     REACTION: nausea  . Lisinopril Swelling    Swelling of tongue   Current Outpatient Prescriptions on File Prior to Visit  Medication Sig Dispense Refill  . amLODipine (NORVASC) 5 MG tablet Take 1 tablet (5 mg total) by mouth daily.  90 tablet  3  . aspirin 81 MG tablet Take 81 mg by mouth daily.        . carvedilol (COREG) 12.5 MG tablet Take 12.5 mg by mouth 2 (two) times daily with a meal.        . cholecalciferol (VITAMIN D) 1000 UNITS  tablet Take 1,000 Units by mouth daily.        . Cyanocobalamin (VITAMIN B 12 PO) 1031mcg  1 tablet by mouth daily/       . EPINEPHrine (EPIPEN 2-PAK IJ) Inject as directed as directed.        . gabapentin (NEURONTIN) 300 MG capsule Take 1 capsule (300 mg total) by mouth 3 (three) times daily.  240 capsule  3  . glimepiride (AMARYL) 2 MG tablet Take 2 mg by mouth 2 (two) times daily.        . metFORMIN (GLUCOPHAGE-XR) 500 MG 24 hr tablet 2 tablets by mouth twice a day.  360 tablet  3  . oxybutynin (DITROPAN) 5 MG tablet Take 1 tablet (5 mg total) by mouth daily.  90 tablet  3  . pantoprazole (PROTONIX) 40 MG tablet Take 1 tablet (40 mg total) by mouth daily.  90 tablet  3  . pravastatin (PRAVACHOL) 80 MG tablet Take 80 mg by mouth daily.        . traMADol (ULTRAM) 50 MG tablet Take 1 tablet (50 mg total) by mouth every 6 (six) hours as needed for pain.  20 tablet  0  . nitroGLYCERIN (NITROSTAT) 0.4 MG SL tablet As needed            Review of Systems Review of Systems  Constitutional: Negative for fever, appetite change,  fatigue and unexpected weight change.  Eyes: Negative for pain and visual disturbance.  Respiratory: Negative for cough and shortness of breath.   Cardiovascular: Negative.  for cp or palp Gastrointestinal: Negative for nausea, diarrhea and constipation.  Genitourinary: pos for frequency / urgency, neg for fever , hematuria  Skin: Negative for pallor.  Neurological: Negative for weakness, light-headedness, numbness and headaches.  Hematological: Negative for adenopathy. Does not bruise/bleed easily.  Psychiatric/Behavioral: Negative for dysphoric mood. The patient is not nervous/anxious.          Objective:   Physical Exam  Constitutional: She appears well-developed and well-nourished. No distress.       overwt and well appearing   HENT:  Head: Normocephalic and atraumatic.  Eyes: Conjunctivae and EOM are normal. Pupils are equal, round, and reactive to light.  Neck: Normal range of motion. Neck supple. No thyromegaly present.  Cardiovascular: Normal rate, regular rhythm and normal heart sounds.   Pulmonary/Chest: Effort normal and breath sounds normal. No respiratory distress. She has no wheezes.  Abdominal: Soft. Bowel sounds are normal. She exhibits no distension and no mass. There is tenderness. There is no rebound and no guarding.       Very slight suprapubic tenderness  Musculoskeletal: She exhibits no edema.       No cva tenderness  Lymphadenopathy:    She has no cervical adenopathy.  Neurological: She is alert. She has normal reflexes.  Skin: Skin is warm and dry. No rash noted. No erythema. No pallor.  Psychiatric: She has a normal mood and affect.          Assessment & Plan:

## 2011-01-28 NOTE — Patient Instructions (Addendum)
Take cipro 250 mg twice daily for 7 days- I sent it to the pharmacy  Drink lots of water and update me if symptoms worsen  If you get nausea or fever please call  We will update you when culture returns

## 2011-01-30 LAB — URINE CULTURE: Colony Count: >=100000

## 2011-02-05 ENCOUNTER — Telehealth: Payer: Self-pay | Admitting: *Deleted

## 2011-02-05 MED ORDER — CIPROFLOXACIN HCL 250 MG PO TABS: 250.0000 mg | ORAL_TABLET | Freq: Two times a day (BID) | ORAL | Status: AC

## 2011-02-05 NOTE — Telephone Encounter (Signed)
Pt took cipro for UTI, finished this yesterday.  She still has frequency, increased urination, burning.  She thinks she needs a few more days of antibiotic.  Uses [facility] [address].

## 2011-02-05 NOTE — Telephone Encounter (Signed)
Patient advised as instructed via telephone.  She will call back later to request Rx for Diflucan after she finishes the antibiotic if she feels that she needs it.

## 2011-02-05 NOTE — Telephone Encounter (Signed)
Her cx did show bacteria sensitive to cipro Lets continue 5 more days and then if still not totally better get another cx Keep drinking fluids Will refill electronically

## 2011-02-12 ENCOUNTER — Telehealth: Payer: Self-pay | Admitting: *Deleted

## 2011-02-12 MED ORDER — FLUCONAZOLE 150 MG PO TABS: 150.0000 mg | ORAL_TABLET | Freq: Once | ORAL | Status: AC

## 2011-02-12 NOTE — Telephone Encounter (Signed)
Pt states she has a yeast infection, after 2 rounds of antibiotic, and she is asking if she can have diflucan called to [facility] [address].

## 2011-02-12 NOTE — Telephone Encounter (Signed)
Let her know I sent it electronically F/u if not improved

## 2011-02-12 NOTE — Telephone Encounter (Signed)
Patient notified as instructed by telephone. 

## 2011-03-06 ENCOUNTER — Other Ambulatory Visit: Payer: Medicare Other

## 2011-03-06 ENCOUNTER — Ambulatory Visit (INDEPENDENT_AMBULATORY_CARE_PROVIDER_SITE_OTHER): Payer: Medicare Other | Admitting: Internal Medicine

## 2011-03-06 ENCOUNTER — Encounter: Payer: Self-pay | Admitting: Internal Medicine

## 2011-03-06 ENCOUNTER — Institutional Professional Consult (permissible substitution): Payer: Medicare Other | Admitting: Internal Medicine

## 2011-03-06 ENCOUNTER — Ambulatory Visit (INDEPENDENT_AMBULATORY_CARE_PROVIDER_SITE_OTHER)
Admission: RE | Admit: 2011-03-06 | Discharge: 2011-03-06 | Disposition: A | Discharge status: Home / Self Care | Payer: Medicare Other | Source: Ambulatory Visit | Attending: Internal Medicine | Admitting: Internal Medicine

## 2011-03-06 VITALS — BP 132/72 | HR 67 | Ht 62.0 in | Wt 161.6 lb

## 2011-03-06 DIAGNOSIS — T783XXA Angioneurotic edema, initial encounter: Secondary | ICD-10-CM

## 2011-03-06 DIAGNOSIS — R220 Localized swelling, mass and lump, head: Secondary | ICD-10-CM

## 2011-03-06 DIAGNOSIS — R221 Localized swelling, mass and lump, neck: Secondary | ICD-10-CM

## 2011-03-06 NOTE — Assessment & Plan Note (Addendum)
Sounds like angioedema of tongue. Will check for Food IgE, suggest daily loratadine as a blocker.  Another possibility would be amyloid of the tongue with no clear understanding of why that would be associated with episodic swelling. May need to consider ENT referral for biopsy the initial question is whether or not an antihistamine can prevent these episodes.

## 2011-03-06 NOTE — Patient Instructions (Addendum)
Order- Food Allergy Profile IgE   Dx angioedema 4630  Order CXR- dx angioedema  Consider taking a daily preventative antihistamine prophylactically, like loratadine/ Claritin - you can still take benadryl in addition if needed  Cc to patient- lab  Pepcid is also a type of antihistamine

## 2011-03-06 NOTE — Progress Notes (Signed)
03/06/11- 76 year old female never smoker comes today with her husband, for allergy evaluation because of swollen tongue, at kind request of Dr. [name]. Starting with first episode 03/14/2010, she has now had 6 episodes when her tongue was well. This usually begins on the left side of her tongue and moves across. Breathing has not been compromised and there has been no other complaint including urticaria sinus congestion joint pain or abdominal cramping that might be related to these episodes. On several occasions she went to the emergency room or an urgent care. Twice she was able to stop it on her own by taking Benadryl. Swelling goes down 30-60 minutes after taking Benadryl. She has recognized no pattern of timing, association with food or temperature or any mechanical trigger. Some minor hoarseness comes and goes but it doesn't seem related. She was taken off of lisinopril in January of 2012 with no effect on the swelling episodes. She has been on Ditropan for years. She has a prescription for tramadol but has only taken 2 tablets. History of RSD after neck surgery. 7 years ago she went to an ENT physician in [place] because of burning tongue but that sensation has not returned. Medical problems include diabetes and elevated cholesterol, chronic headaches and fatty liver. She had donated a kidney to her brother. There is no recognized intolerance to latex, contrast dye or aspirin.  ROS- Constitutional:   No-   weight loss, night sweats, fevers, chills, fatigue, lassitude. HEENT:   +  Headaches,  No- difficulty swallowing,+ tooth/dental problems, sore throat,       No-  sneezing, itching, ear ache, nasal congestion, post nasal drip, + episodic hoarseness CV:  No-   chest pain, orthopnea, PND, swelling in lower extremities, anasarca, dizziness, palpitations Resp: No-   shortness of breath with exertion or at rest.              No-   productive cough,  No non-productive cough,  No-  coughing up of blood.                No-   change in color of mucus.  No- wheezing.   Skin: No-   rash or lesions. GI:  No-   Heartburn,+ indigestion, +abdominal pain, nausea, vomiting, diarrhea,                 change in bowel habits, loss of appetite GU: No-   dysuria, change in color of urine, no urgency or frequency.  No- flank pain. MS:  +   joint pain or swelling.  No- decreased range of motion.  No- back pain. Neuro- grossly normal to observation, Or:  Psych:  No- change in mood or affect. No depression or anxiety.  No memory loss.  OBJ- General- Alert, Oriented, Affect-appropriate, Distress- none acute Skin- rash-none, lesions- none, excoriation- none Lymphadenopathy- none Head- atraumatic            Eyes- Gross vision intact, PERRLA, conjunctivae clear secretions            Ears- Hearing, canals-normal            Nose- Clear, no-Septal dev, mucus, polyps, erosion, perforation             Throat- Mallampati II , mucosa clear , drainage- none, tonsils- atrophic. His tongue is rather large but it looks normal otherwise. Neck- stiff apparently reflecting postoperative changes , trachea midline, no stridor , thyroid nl, faint right carotid bruit Chest - symmetrical excursion , unlabored  Heart/CV- RRR , no murmur , no gallop  , no rub, nl s1 s2                           - JVD- none , edema- none, stasis changes- none, varices- none           Lung- clear to P&A, wheeze- none, cough- none , dullness-none, rub- none           Chest wall-  Abd- tender-no, distended-no, bowel sounds-present, HSM- no Br/ Gen/ Rectal- Not done, not indicated Extrem- cyanosis- none, clubbing, none, atrophy- none, strength- nl Neuro- grossly intact to observation

## 2011-03-07 LAB — ALLERGEN FOOD PROFILE SPECIFIC IGE
Apple: <0.1 kU/L (ref <0.35)
Corn: <0.1 kU/L (ref <0.35)
Egg White IgE: <0.1 kU/L (ref <0.35)
Fish Cod: <0.1 kU/L (ref <0.35)
IgE (Immunoglobulin E), Serum: 40.6 IU/mL (ref 0.0–180.0)
Orange: <0.1 kU/L (ref <0.35)
Shrimp IgE: <0.1 kU/L (ref <0.35)
Soybean IgE: <0.1 kU/L (ref <0.35)
Wheat IgE: <0.1 kU/L (ref <0.35)

## 2011-03-08 ENCOUNTER — Encounter: Payer: Self-pay | Admitting: Family Medicine

## 2011-03-11 ENCOUNTER — Ambulatory Visit (INDEPENDENT_AMBULATORY_CARE_PROVIDER_SITE_OTHER): Payer: Medicare Other | Admitting: Family Medicine

## 2011-03-11 ENCOUNTER — Encounter: Payer: Self-pay | Admitting: Family Medicine

## 2011-03-11 VITALS — BP 134/70 | HR 72 | Temp 97.9°F | Ht 62.0 in | Wt 162.8 lb

## 2011-03-11 DIAGNOSIS — N390 Urinary tract infection, site not specified: Secondary | ICD-10-CM

## 2011-03-11 DIAGNOSIS — R350 Frequency of micturition: Secondary | ICD-10-CM

## 2011-03-11 LAB — POCT URINALYSIS DIPSTICK
Nitrite, UA: NEGATIVE
Urobilinogen, UA: 0.2
pH, UA: 5

## 2011-03-11 LAB — POCT UA - MICROSCOPIC ONLY

## 2011-03-11 MED ORDER — CIPROFLOXACIN HCL 250 MG PO TABS: 250.0000 mg | ORAL_TABLET | Freq: Two times a day (BID) | ORAL | Status: AC

## 2011-03-11 NOTE — Assessment & Plan Note (Signed)
Pt never had complete symptom relief after her e coli infx in end of aug-- with sens to cipro  Now a bit worse as well  Has hx of urge incontinence - on ditropan for that does not have a urologist) ua is borderline today  Will cx it  Cover with cipro in meantime  Ref to urol if neg  Pt has one kidney

## 2011-03-11 NOTE — Patient Instructions (Signed)
Drink lots of water  No other beverages but water  Also avoid spicy and artificial sweeteners  Update if worse  Take the cipro until we get your culture back -- and then we will make a plan

## 2011-03-11 NOTE — Progress Notes (Signed)
Subjective:    Patient ID: Morgan Hubbard, female    DOB: 1935-04-28, 76 y.o.   MRN: [id number]  HPI Here for urine symptoms- frequency Started getting worse middle of last week  Burning is on the skin more skin  No vaginal d/c or itching  No bladder pain   Also urgency   No fever or (new back pain) or n/v   She does have hx of incontinence - and takes ditropan   ua dip today shows tr leuk and tr blood  Last uti was end of aug- e coli- sens to cipro and tx  Never got completely better - with mild frequency and burning -- improved   Patient Active Problem List  Diagnoses  . DM  . HYPERCHOLESTEROLEMIA  . HYPERKALEMIA  . REFLEX SYMPATHETIC DYSTROPHY  . POLYNEUROPATHY  . HYPERTENSION  . CARDIOMYOPATHY  . OVERACTIVE BLADDER  . Fatty liver  . Left sided abdominal pain  . Tongue swelling  . Goiter  . UTI (lower urinary tract infection)   Past Medical History  Diagnosis Date  . Arthritis   . Diabetes mellitus   . Headache   . GERD (gastroesophageal reflux disease)   . Ulcer   . Hyperlipidemia   . Migraine   . Colon polyps   . Thyroid disease   . Shingles   . Urine incontinence   . Goiter past remote    treated with RI  . Other primary cardiomyopathies   . Hyperpotassemia   . Inflammatory and toxic neuropathy, unspecified   . Reflex sympathetic dystrophy, unspecified   . Hypertonicity of bladder   . Hypertension    Past Surgical History  Procedure Date  . Breast surgery 19599    breast cyst  . Finger surgery [place]  . Tubal ligation 1976  . Kidney donation 20  . Spine surgery 1999  . Ct scan 3/12    outside hosp- lung nodule and gallstones  . Upper gastrointestinal endoscopy   . Colonoscopy 7/12    normal (hx of polyps in past)    History  Substance Use Topics  . Smoking status: Never Smoker   . Smokeless tobacco: Not on file  . Alcohol Use: Yes   Family History  Problem Relation Age of Onset  . Arthritis Mother   . Cancer Mother    uterine and mouth  . Hyperlipidemia Mother   . Stroke Mother   . Hypertension Mother   . Alcohol abuse Father   . Diabetes Father   . Cancer Sister     breast  . Diabetes Sister   . Cancer Brother     lung cancer  . Kidney disease Brother   . Diabetes Brother   . Hyperlipidemia Sister   . Heart disease Sister   . Hypertension Sister   . Diabetes Sister   . Hyperlipidemia Brother   . Kidney failure Brother   . Diabetes Daughter    Allergies  Allergen Reactions  . Ace Inhibitors     REACTION: tongue swelling  . Codeine     REACTION: nausea  . Lisinopril Swelling    Swelling of tongue   Current Outpatient Prescriptions on File Prior to Visit  Medication Sig Dispense Refill  . amLODipine (NORVASC) 5 MG tablet Take 1 tablet (5 mg total) by mouth daily.  90 tablet  3  . aspirin 81 MG tablet Take 81 mg by mouth daily.        . carvedilol (COREG) 12.5 MG  tablet Take 12.5 mg by mouth 2 (two) times daily with a meal.        . cholecalciferol (VITAMIN D) 1000 UNITS tablet Take 1,000 Units by mouth daily.        . Cyanocobalamin (VITAMIN B 12 PO) 1064mcg  1 tablet by mouth daily/       . gabapentin (NEURONTIN) 300 MG capsule Take 1 capsule (300 mg total) by mouth 3 (three) times daily.  240 capsule  3  . glimepiride (AMARYL) 2 MG tablet Take 2 mg by mouth 2 (two) times daily.        . metFORMIN (GLUCOPHAGE-XR) 500 MG 24 hr tablet 2 tablets by mouth twice a day.  360 tablet  3  . oxybutynin (DITROPAN) 5 MG tablet Take 1 tablet (5 mg total) by mouth daily.  90 tablet  3  . pantoprazole (PROTONIX) 40 MG tablet Take 1 tablet (40 mg total) by mouth daily.  90 tablet  3  . pravastatin (PRAVACHOL) 80 MG tablet Take 80 mg by mouth daily.        . traMADol (ULTRAM) 50 MG tablet Take 1 tablet (50 mg total) by mouth every 6 (six) hours as needed for pain.  20 tablet  0  . EPINEPHrine (EPIPEN 2-PAK IJ) Inject as directed as directed.        . nitroGLYCERIN (NITROSTAT) 0.4 MG SL tablet As needed           Review of Systems Review of Systems  Constitutional: Negative for fever, appetite change, fatigue and unexpected weight change.  Eyes: Negative for pain and visual disturbance.  Respiratory: Negative for cough and shortness of breath.   Cardiovascular: Negative for cp or palpitations    Gastrointestinal: Negative for nausea, diarrhea and constipation.  Genitourinary: pos for urgency/ frequency , some dysuria, no hematuria/back ache or fever  Skin: Negative for pallor or rash   Neurological: Negative for weakness, light-headedness, numbness and headaches.  Hematological: Negative for adenopathy. Does not bruise/bleed easily.  Psychiatric/Behavioral: Negative for dysphoric mood. The patient is not nervous/anxious.          Objective:   Physical Exam  Constitutional: She appears well-developed and well-nourished. No distress.       overwt and well appearing   HENT:  Head: Normocephalic and atraumatic.  Mouth/Throat: Oropharynx is clear and moist.  Eyes: Conjunctivae and EOM are normal. Pupils are equal, round, and reactive to light.  Neck: Normal range of motion. Neck supple.  Cardiovascular: Normal rate, regular rhythm and normal heart sounds.   Pulmonary/Chest: Effort normal and breath sounds normal. No respiratory distress. She has no wheezes.  Abdominal: Soft. She exhibits no distension and no mass. There is tenderness. There is no rebound and no guarding.       Mild suprapubic tenderness  Musculoskeletal:       No cva tenderness  Lymphadenopathy:    She has no cervical adenopathy.  Neurological: She is alert. She has normal reflexes.  Skin: Skin is warm and dry. No rash noted. No erythema.  Psychiatric: She has a normal mood and affect.          Assessment & Plan:

## 2011-03-13 LAB — HM MAMMOGRAPHY: HM Mammogram: NORMAL

## 2011-03-14 LAB — URINE CULTURE

## 2011-04-01 ENCOUNTER — Telehealth: Payer: Self-pay | Admitting: Internal Medicine

## 2011-04-01 NOTE — Telephone Encounter (Signed)
I spoke with her- phone note. She will take 30 mg of her husband's prednisone and 10 mg of claritin, on top of the 3 benadryl she took and hour after the swelling started in the left side of her tongue. Breathing not affected, but she has Epipen if needed.

## 2011-04-03 ENCOUNTER — Ambulatory Visit: Payer: Medicare Other | Admitting: Family Medicine

## 2011-04-08 ENCOUNTER — Ambulatory Visit (INDEPENDENT_AMBULATORY_CARE_PROVIDER_SITE_OTHER): Payer: Medicare Other | Admitting: Family Medicine

## 2011-04-08 ENCOUNTER — Encounter: Payer: Self-pay | Admitting: Family Medicine

## 2011-04-08 VITALS — BP 126/74 | HR 76 | Temp 97.9°F | Ht 62.0 in | Wt 163.0 lb

## 2011-04-08 DIAGNOSIS — M25512 Pain in left shoulder: Secondary | ICD-10-CM | POA: Insufficient documentation

## 2011-04-08 DIAGNOSIS — R319 Hematuria, unspecified: Secondary | ICD-10-CM | POA: Insufficient documentation

## 2011-04-08 DIAGNOSIS — R350 Frequency of micturition: Secondary | ICD-10-CM

## 2011-04-08 DIAGNOSIS — N390 Urinary tract infection, site not specified: Secondary | ICD-10-CM

## 2011-04-08 DIAGNOSIS — M25519 Pain in unspecified shoulder: Secondary | ICD-10-CM

## 2011-04-08 LAB — POCT UA - MICROSCOPIC ONLY

## 2011-04-08 LAB — POCT URINALYSIS DIPSTICK
Bilirubin, UA: NEGATIVE
Ketones, UA: NEGATIVE
Nitrite, UA: NEGATIVE
Spec Grav, UA: 1.02
pH, UA: 7.5

## 2011-04-08 NOTE — Assessment & Plan Note (Signed)
Prominent acromion area that is sore Possibly from new exercise Adv ice and relative rest Update if not improved

## 2011-04-08 NOTE — Assessment & Plan Note (Signed)
This looks to be improved - clinically and on ua  However- in light of rbc on micro and single kidney- I will ref to urol as well for further eval

## 2011-04-08 NOTE — Progress Notes (Signed)
Subjective:    Patient ID: Morgan Hubbard, female    DOB: 1934-07-30, 76 y.o.   MRN: [id number]  HPI Here for f/u of recurrent e coli utis (with one kidney) and also for painful area on L shoulder  Last visit had ucx with 60,000 colonies of ecoli sens to cipro Took course of cipro   Is feeling better today- no longer has burning  On urination or bladder discomfort  Took a while to get it better  Frequency is normal for her - that is not new     ua today- still shows some rbc   Tender area popped up on L shoulder yesterday  Just sore to the touch No knot or skin change No trauma  Patient Active Problem List  Diagnoses  . DM  . HYPERCHOLESTEROLEMIA  . HYPERKALEMIA  . REFLEX SYMPATHETIC DYSTROPHY  . POLYNEUROPATHY  . HYPERTENSION  . CARDIOMYOPATHY  . OVERACTIVE BLADDER  . Fatty liver  . Left sided abdominal pain  . Tongue swelling  . Goiter  . UTI (lower urinary tract infection)  . Hematuria  . Left shoulder pain   Past Medical History  Diagnosis Date  . Arthritis   . Diabetes mellitus   . Headache   . GERD (gastroesophageal reflux disease)   . Ulcer   . Hyperlipidemia   . Migraine   . Colon polyps   . Thyroid disease   . Shingles   . Urine incontinence   . Goiter past remote    treated with RI  . Other primary cardiomyopathies   . Hyperpotassemia   . Inflammatory and toxic neuropathy, unspecified   . Reflex sympathetic dystrophy, unspecified   . Hypertonicity of bladder   . Hypertension    Past Surgical History  Procedure Date  . Breast surgery 19599    breast cyst  . Finger surgery [place]  . Tubal ligation 1976  . Kidney donation 77  . Spine surgery 1999  . Ct scan 3/12    outside hosp- lung nodule and gallstones  . Upper gastrointestinal endoscopy   . Colonoscopy 7/12    normal (hx of polyps in past)    History  Substance Use Topics  . Smoking status: Never Smoker   . Smokeless tobacco: Not on file  . Alcohol Use: Yes   Family  History  Problem Relation Age of Onset  . Arthritis Mother   . Cancer Mother     uterine and mouth  . Hyperlipidemia Mother   . Stroke Mother   . Hypertension Mother   . Alcohol abuse Father   . Diabetes Father   . Cancer Sister     breast  . Diabetes Sister   . Cancer Brother     lung cancer  . Kidney disease Brother   . Diabetes Brother   . Hyperlipidemia Sister   . Heart disease Sister   . Hypertension Sister   . Diabetes Sister   . Hyperlipidemia Brother   . Kidney failure Brother   . Diabetes Daughter    Allergies  Allergen Reactions  . Ace Inhibitors     REACTION: tongue swelling  . Codeine     REACTION: nausea  . Lisinopril Swelling    Swelling of tongue   Current Outpatient Prescriptions on File Prior to Visit  Medication Sig Dispense Refill  . amLODipine (NORVASC) 5 MG tablet Take 1 tablet (5 mg total) by mouth daily.  90 tablet  3  . aspirin 81 MG  tablet Take 81 mg by mouth daily.        . carvedilol (COREG) 12.5 MG tablet Take 12.5 mg by mouth 2 (two) times daily with a meal.        . cholecalciferol (VITAMIN D) 1000 UNITS tablet Take 1,000 Units by mouth daily.        . Cyanocobalamin (VITAMIN B 12 PO) 1010mcg  1 tablet by mouth daily/       . gabapentin (NEURONTIN) 300 MG capsule Take 1 capsule (300 mg total) by mouth 3 (three) times daily.  240 capsule  3  . glimepiride (AMARYL) 2 MG tablet Take 2 mg by mouth 2 (two) times daily.        . metFORMIN (GLUCOPHAGE-XR) 500 MG 24 hr tablet 2 tablets by mouth twice a day.  360 tablet  3  . oxybutynin (DITROPAN) 5 MG tablet Take 1 tablet (5 mg total) by mouth daily.  90 tablet  3  . pantoprazole (PROTONIX) 40 MG tablet Take 1 tablet (40 mg total) by mouth daily.  90 tablet  3  . pravastatin (PRAVACHOL) 80 MG tablet Take 80 mg by mouth daily.        Marland Kitchen EPINEPHrine (EPIPEN 2-PAK IJ) Inject as directed as directed.        . nitroGLYCERIN (NITROSTAT) 0.4 MG SL tablet As needed       . traMADol (ULTRAM) 50 MG tablet  Take 1 tablet (50 mg total) by mouth every 6 (six) hours as needed for pain.  20 tablet  0      Review of Systems Review of Systems  Constitutional: Negative for fever, appetite change, fatigue and unexpected weight change.  Eyes: Negative for pain and visual disturbance.  Respiratory: Negative for cough and shortness of breath.   Cardiovascular: Negative for cp or palpitations    Gastrointestinal: Negative for nausea, diarrhea and constipation.  Genitourinary: pos for urgency and frequency, no gross hematuria MSK pos for shoulder pain , no acute joint swelling  Skin: Negative for pallor or rash   Neurological: Negative for weakness, light-headedness, numbness and headaches.  Hematological: Negative for adenopathy. Does not bruise/bleed easily.  Psychiatric/Behavioral: Negative for dysphoric mood. The patient is not nervous/anxious.          Objective:   Physical Exam  Constitutional: She appears well-developed and well-nourished. No distress.  HENT:  Head: Normocephalic and atraumatic.  Mouth/Throat: Oropharynx is clear and moist.  Eyes: Conjunctivae and EOM are normal. Pupils are equal, round, and reactive to light.  Neck: Normal range of motion. Neck supple. No JVD present. Carotid bruit is not present. Erythema present. No thyromegaly present.  Cardiovascular: Normal rate, regular rhythm, normal heart sounds and intact distal pulses.  Exam reveals no gallop.   Pulmonary/Chest: Effort normal and breath sounds normal. No respiratory distress. She has no wheezes.  Abdominal: Soft. Bowel sounds are normal. She exhibits no distension and no mass. There is no tenderness. There is no guarding.       No suprapubic tenderness    Musculoskeletal: She exhibits edema and tenderness.       No cva tenderness L shoulder area - prominent acromion without redness / some tenderness Nl rom shoulder  Some pain on hawkings maneuver Nl grip/ perfusion  Lymphadenopathy:    She has no cervical  adenopathy.  Neurological: She is alert. She has normal reflexes. No cranial nerve deficit. She exhibits normal muscle tone. Coordination normal.  Skin: Skin is warm and dry. No rash  noted. No erythema. No pallor.  Psychiatric: She has a normal mood and affect.          Assessment & Plan:

## 2011-04-08 NOTE — Patient Instructions (Signed)
We will do a referral to urologist at check out for recurrent urinary infection and small amount of blood cells in urine  We will do another urine culture and update you  Use ice on shoulder - and update me if it does not stop bothering you

## 2011-04-08 NOTE — Assessment & Plan Note (Signed)
Recurrent uti and some microscopic hematuria today  ecoli- with 3 rounds of abx- clinically improved Only has 1 kidney cx urine and ref to urology for further eval

## 2011-04-16 ENCOUNTER — Telehealth: Payer: Self-pay | Admitting: Family Medicine

## 2011-04-16 NOTE — Telephone Encounter (Signed)
Cardiologist in [place], Dr. Marylu Lund, [facility], has requested lipid panel and hepatic function panel.  Patient asked if she has this on file that can be seen or if she needs to come in for those tests and she'd like to ask if it can be done at [facility] if possible and sent to doctor in [place].  Dr. Debbora Lacrosse fax is [phone] and phone number is [phone].  This is for her six month follow up with cardiology.  Please call Taijah back at [phone] or [phone].

## 2011-04-17 NOTE — Telephone Encounter (Signed)
Patient notified as instructed by telephone. Pt said to include on face sheet if the labs were not suitable to phone pt at [phone].Faxed labs: lipid from [id number] and metabolic profile dated 0000000 to [phone].

## 2011-06-21 ENCOUNTER — Other Ambulatory Visit (INDEPENDENT_AMBULATORY_CARE_PROVIDER_SITE_OTHER): Payer: Medicare Other

## 2011-06-21 ENCOUNTER — Ambulatory Visit (INDEPENDENT_AMBULATORY_CARE_PROVIDER_SITE_OTHER): Payer: Medicare Other | Admitting: Endocrinology

## 2011-06-21 ENCOUNTER — Encounter: Payer: Self-pay | Admitting: Endocrinology

## 2011-06-21 VITALS — BP 132/72 | HR 68 | Temp 97.6°F | Ht 63.0 in | Wt 161.6 lb

## 2011-06-21 DIAGNOSIS — E119 Type 2 diabetes mellitus without complications: Secondary | ICD-10-CM

## 2011-06-21 LAB — HEMOGLOBIN A1C: Hgb A1c MFr Bld: 9.2 % — ABNORMAL HIGH (ref 4.6–6.5)

## 2011-06-21 MED ORDER — SITAGLIPTIN PHOSPHATE 100 MG PO TABS: 100.0000 mg | ORAL_TABLET | Freq: Every day | ORAL | Status: DC

## 2011-06-21 MED ORDER — GLIMEPIRIDE 1 MG PO TABS: 1.0000 mg | ORAL_TABLET | Freq: Every day | ORAL | Status: DC

## 2011-06-21 NOTE — Patient Instructions (Addendum)
blood tests are being requested for you today.  please call [phone] to hear your test results.  You will be prompted to enter the 9-digit "MRN" number that appears at the top left of this page, followed by #.  Then you will hear the message. Add januvia 100 mg each morning.  i have sent a prescription to your pharmacy. Reduce the glimepiride to 1 mg each morning.  i have sent a prescription to your pharmacy. good diet and exercise habits significanly improve the control of your diabetes.  please let me know if you wish to be referred to a dietician.  high blood sugar is very risky to your health.  you should see an eye doctor every year. controlling your blood pressure and cholesterol drastically reduces the damage diabetes does to your body.  this also applies to quitting smoking.  please discuss these with your doctor.  you should take an aspirin every day, unless you have been advised by a doctor not to. check your blood sugar 1 time a day.  vary the time of day when you check, between before the 3 meals, and at bedtime.  also check if you have symptoms of your blood sugar being too high or too low.  please keep a record of the readings and bring it to your next appointment here.  please call us sooner if your blood sugar goes below 70, or if it stays over 200. Please come back for a follow-up appointment for 1 month.  Please make an appointment. (update: i left message on phone-tree:  a1c is high.  Add [place], but do not decrease the amaryl)

## 2011-06-21 NOTE — Progress Notes (Signed)
Subjective:    Patient ID: Morgan Hubbard, female    DOB: January 27, 1935, 77 y.o.   MRN: [id number]  HPI Pt return for f/u of type 2 DM (1997).  She says she was found to have low EF, at a cardiologist in [place] recently.  She has few mos of slight swelling of the feet (left > right), and assoc doe Past Medical History  Diagnosis Date  . Arthritis   . Diabetes mellitus   . Headache   . GERD (gastroesophageal reflux disease)   . Ulcer   . Hyperlipidemia   . Migraine   . Colon polyps   . Thyroid disease   . Shingles   . Urine incontinence   . Goiter past remote    treated with RI  . Other primary cardiomyopathies   . Hyperpotassemia   . Inflammatory and toxic neuropathy, unspecified   . Reflex sympathetic dystrophy, unspecified   . Hypertonicity of bladder   . Hypertension     Past Surgical History  Procedure Date  . Breast surgery 19599    breast cyst  . Finger surgery [place]  . Tubal ligation 1976  . Kidney donation 60  . Spine surgery 1999  . Ct scan 3/12    outside hosp- lung nodule and gallstones  . Upper gastrointestinal endoscopy   . Colonoscopy 7/12    normal (hx of polyps in past)     History   Social History  . Marital Status: Married    Spouse Name: N/A    Number of Children: 2  . Years of Education: N/A   Occupational History  . retired - Production manager     Social History Main Topics  . Smoking status: Never Smoker   . Smokeless tobacco: Not on file  . Alcohol Use: Yes  . Drug Use: No  . Sexually Active: Not on file   Other Topics Concern  . Not on file   Social History Narrative   Married 2nd timeDaughter is [name]     Current Outpatient Prescriptions on File Prior to Visit  Medication Sig Dispense Refill  . amLODipine (NORVASC) 5 MG tablet Take 1 tablet (5 mg total) by mouth daily.  90 tablet  3  . aspirin 81 MG tablet Take 81 mg by mouth daily.        . carvedilol (COREG) 12.5 MG tablet Take 12.5 mg by mouth 2 (two)  times daily with a meal.        . cholecalciferol (VITAMIN D) 1000 UNITS tablet Take 1,000 Units by mouth daily.        . Cyanocobalamin (VITAMIN B 12 PO) 1059mcg  1 tablet by mouth daily/       . EPINEPHrine (EPIPEN 2-PAK IJ) Inject as directed as directed.        . gabapentin (NEURONTIN) 300 MG capsule Take 1 capsule (300 mg total) by mouth 3 (three) times daily.  240 capsule  3  . ibuprofen (ADVIL,MOTRIN) 800 MG tablet Take 800 mg by mouth every 8 (eight) hours as needed.        . metFORMIN (GLUCOPHAGE-XR) 500 MG 24 hr tablet 2 tablets by mouth twice a day.  360 tablet  3  . nitroGLYCERIN (NITROSTAT) 0.4 MG SL tablet As needed       . pantoprazole (PROTONIX) 40 MG tablet Take 1 tablet (40 mg total) by mouth daily.  90 tablet  3  . pravastatin (PRAVACHOL) 80 MG tablet Take 80 mg  by mouth daily.        . traMADol (ULTRAM) 50 MG tablet Take 1 tablet (50 mg total) by mouth every 6 (six) hours as needed for pain.  20 tablet  0  . oxybutynin (DITROPAN) 5 MG tablet Take 1 tablet (5 mg total) by mouth daily.  90 tablet  3    Allergies  Allergen Reactions  . Ace Inhibitors     REACTION: tongue swelling  . Codeine     REACTION: nausea  . Lisinopril Swelling    Swelling of tongue    Family History  Problem Relation Age of Onset  . Arthritis Mother   . Cancer Mother     uterine and mouth  . Hyperlipidemia Mother   . Stroke Mother   . Hypertension Mother   . Alcohol abuse Father   . Diabetes Father   . Cancer Sister     breast  . Diabetes Sister   . Cancer Brother     lung cancer  . Kidney disease Brother   . Diabetes Brother   . Hyperlipidemia Sister   . Heart disease Sister   . Hypertension Sister   . Diabetes Sister   . Hyperlipidemia Brother   . Kidney failure Brother   . Diabetes Daughter     BP 132/72  Pulse 68  Temp(Src) 97.6 F (36.4 C) (Oral)  Ht 5\' 3"  (1.6 m)  Wt 161 lb 9.6 oz (73.301 kg)  BMI 28.63 kg/m2  SpO2 95%  LMP 06/03/1992  Review of Systems No  change in chronic pain and numbness of the feet.      Objective:   Physical Exam VITAL SIGNS:  See vs page GENERAL: no distress Pulses: dorsalis pedis intact bilat.   Feet: no deformity.  no ulcer on the feet.  feet are of normal color and temp.  1+ leg edema on the left, and trace on the right. Neuro: sensation is intact to touch on the feet.   Lab Results  Component Value Date   HGBA1C 9.2* 06/21/2011      Assessment & Plan:  DM, needs increased rx Cardiomyopathy. He will need to be tapered off amaryl and glucophage as possible Edema.  This is a relative contraindication to actos.

## 2011-06-28 ENCOUNTER — Telehealth: Payer: Self-pay | Admitting: Family Medicine

## 2011-06-28 NOTE — Telephone Encounter (Signed)
Patient states that her insurance changed and she needs to have her refills for Gadapentin 300 mg 3 times a day and Glimepiride 2mg  two times a day called into new pharmacy #s.  She said she needs as soon as possible because she is low on them.  Please check the medication and dose to confirm and call or fax to Pharmacy fax # is [phone] phone is [phone] and patient can be reached at [phone]

## 2011-06-30 NOTE — Telephone Encounter (Signed)
I need to know 3 mo or 1 mo supply and also name of the pharmacy please

## 2011-07-01 MED ORDER — GLIMEPIRIDE 2 MG PO TABS: 2.0000 mg | ORAL_TABLET | Freq: Two times a day (BID) | ORAL | Status: DC

## 2011-07-01 MED ORDER — GABAPENTIN 300 MG PO CAPS: 300.0000 mg | ORAL_CAPSULE | Freq: Three times a day (TID) | ORAL | Status: DC

## 2011-07-01 NOTE — Telephone Encounter (Signed)
Spoke with pt and she said Dr Loanne Drilling had recently changed Glimepiride 2 mg to twice a day due to Hgb A1c being high. Pt said she needed 3 month supply and [facility] is the new [facility]

## 2011-07-01 NOTE — Telephone Encounter (Signed)
Will refill electronically to [facility]

## 2011-07-08 ENCOUNTER — Other Ambulatory Visit: Payer: Medicare Other

## 2011-07-08 ENCOUNTER — Encounter: Payer: Self-pay | Admitting: Internal Medicine

## 2011-07-08 ENCOUNTER — Ambulatory Visit (INDEPENDENT_AMBULATORY_CARE_PROVIDER_SITE_OTHER): Payer: Medicare Other | Admitting: Internal Medicine

## 2011-07-08 VITALS — BP 122/70 | HR 70 | Ht 62.0 in | Wt 159.6 lb

## 2011-07-08 DIAGNOSIS — T465X5A Adverse effect of other antihypertensive drugs, initial encounter: Secondary | ICD-10-CM

## 2011-07-08 DIAGNOSIS — Z888 Allergy status to other drugs, medicaments and biological substances status: Secondary | ICD-10-CM

## 2011-07-08 DIAGNOSIS — R221 Localized swelling, mass and lump, neck: Secondary | ICD-10-CM

## 2011-07-08 DIAGNOSIS — R220 Localized swelling, mass and lump, head: Secondary | ICD-10-CM

## 2011-07-08 NOTE — Patient Instructions (Addendum)
Order- lab- ACE panel- [phone]  Your cardiologist may want to consider using and ARB drug instead of an ACE inhibitor.  Ok to use an antihistamine like benadryl or allegra if needed for tongue swelling

## 2011-07-08 NOTE — Progress Notes (Addendum)
03/06/11- 75 yoF never smoker followed here for asthma, allergic rhinitis, hx of insomnia, hx of urticaria., at kind request of Dr. [name]. Starting with first episode 03/14/2010, she has now had 6 episodes when her tongue was swollenl. This usually begins on the left side of her tongue and moves across. Breathing has not been compromised and there has been no other complaint including urticaria sinus congestion joint pain or abdominal cramping that might be related to these episodes. On several occasions she went to the emergency room or an urgent care. Twice she was able to stop it on her own by taking Benadryl. Swelling goes down 30-60 minutes after taking Benadryl. She has recognized no pattern of timing, association with food or temperature or any mechanical trigger. Some minor hoarseness comes and goes but it doesn't seem related. She was taken off of lisinopril in January of 2012 with no effect on the swelling episodes. She has been on Ditropan for years. She has a prescription for tramadol but has only taken 2 tablets. History of RSD after neck surgery. 7 years ago she went to an ENT physician in [place] because of burning tongue but that sensation has not returned. Medical problems include diabetes and elevated cholesterol, chronic headaches and fatty liver. She had donated a kidney to her brother. There is no recognized intolerance to latex, contrast dye or aspirin.  07/08/11- 75 yoF never smoker followed here for tongue swelling,  asthma, allergic rhinitis, hx of insomnia, hx of urticaria. Denies any edema in tongue since last visit; has however had heart issues and SOB and was seen by cardiology; Cardiology would like to have pt tested to see if truly allergic to ACE inhibitor. Apparently cardiologist at [facility] wants to try an ACE inhibitor. She has had a total of 7 episodes of tongue swelling of which 4 were unrelated to lisinopril or to food. She can block these episodes with Benadryl. The most recent  one was several months ago in late October. She had PFTs at [facility], being sent to Dr. [name].   ROS-see HPI Constitutional:   No-   weight loss, night sweats, fevers, chills, fatigue, lassitude. HEENT:   +  Headaches,  No- difficulty swallowing, tooth/dental problems, sore throat,       No-  sneezing, itching, ear ache, nasal congestion, post nasal drip, + episodic hoarseness CV:  No-   chest pain, orthopnea, PND, swelling in lower extremities, anasarca, dizziness, palpitations Resp: No-   shortness of breath with exertion or at rest.              No-   productive cough,  No non-productive cough,  No-  coughing up of blood.              No-   change in color of mucus.  No- wheezing.   Skin: No-   rash or lesions. GI:  No-   Heartburn,+ indigestion, +abdominal pain, nausea, vomiting, diarrhea,                 change in bowel habits, loss of appetite GU:  MS:  +   joint pain or swelling.  No- decreased range of motion.  No- back pain. Neuro- grossly normal to observation, Or:  Psych:  No- change in mood or affect. No depression or anxiety.  No memory loss.  OBJ- General- Alert, Oriented, Affect-appropriate, Distress- none acute Skin- rash-none, lesions- none, excoriation- none Lymphadenopathy- none Head- atraumatic  Eyes- Gross vision intact, PERRLA, conjunctivae clear secretions            Ears- Hearing, canals-normal            Nose- Clear, no-Septal dev, mucus, polyps, erosion, perforation             Throat- Mallampati II , mucosa clear , drainage- none, tonsils- atrophic. Her tongue is rather large but it looks normal otherwise. Neck- stiff apparently reflecting postoperative changes , trachea midline, no stridor , thyroid nl, faint right carotid bruit Chest - symmetrical excursion , unlabored           Heart/CV- RRR , no murmur , no gallop  , no rub, nl s1 s2                           - JVD- none , edema- none, stasis changes- none, varices- none           Lung- clear to  P&A, wheeze- none, cough- none , dullness-none, rub- none           Chest wall-  Abd-  Br/ Gen/ Rectal- Not done, not indicated Extrem- cyanosis- none, clubbing, none, atrophy- none, strength- nl Neuro- grossly intact to observation

## 2011-07-10 LAB — ANGIOTENSIN CONVERTING ENZYME: Angiotensin-Converting Enzyme: 50 U/L (ref 8–52)

## 2011-07-11 ENCOUNTER — Encounter: Payer: Self-pay | Admitting: Internal Medicine

## 2011-07-11 NOTE — Assessment & Plan Note (Addendum)
I explained that the ACE inhibitor type of angioedema/ urticaria is not an IgE type allergy for which I have tests. There is an angioedema panel we can send. She can take the risk of restarting lisinopril and watching for symptoms/recurrence of tongue swelling. I suggested instead that perhaps her cardiologist could use a ARB.

## 2011-07-18 ENCOUNTER — Encounter: Payer: Self-pay | Admitting: Family Medicine

## 2011-07-19 ENCOUNTER — Telehealth: Payer: Self-pay | Admitting: Internal Medicine

## 2011-07-19 ENCOUNTER — Other Ambulatory Visit: Payer: Self-pay

## 2011-07-19 MED ORDER — PANTOPRAZOLE SODIUM 40 MG PO TBEC: 40.0000 mg | DELAYED_RELEASE_TABLET | Freq: Every day | ORAL | Status: DC

## 2011-07-19 MED ORDER — AMLODIPINE BESYLATE 5 MG PO TABS: 5.0000 mg | ORAL_TABLET | Freq: Every day | ORAL | Status: DC

## 2011-07-19 MED ORDER — PRAVASTATIN SODIUM 80 MG PO TABS: 80.0000 mg | ORAL_TABLET | Freq: Every day | ORAL | Status: DC

## 2011-07-19 NOTE — Telephone Encounter (Signed)
Thanks for the correction- records updated to correct.

## 2011-07-19 NOTE — Telephone Encounter (Signed)
Pt returned call. Says she didn't need to speak to nurse just needs this changed in her chart since she has "never" smoked.

## 2011-07-19 NOTE — Telephone Encounter (Signed)
[email or web address]  

## 2011-07-19 NOTE — Telephone Encounter (Signed)
I spoke with pt and she stated in Dr. Janee Morn note 07/07/10 she has it states she is a former smoker. Pt states she has never smoked in her life. I looked in pt chart and it show's this is so. Pt states she wants this taken off his note bc she doesn't want ppl thinking she has smoked before. Please advise Dr. Annamaria Boots, thanks

## 2011-07-19 NOTE — Telephone Encounter (Signed)
Pt requested by note when husband came to office refills to [facility] for patoprazole 40 mg #90 x 3, Amlodipine 5 mg #90 x3 and Pravastatin 80 mg #90 x 3.

## 2011-07-23 ENCOUNTER — Ambulatory Visit (INDEPENDENT_AMBULATORY_CARE_PROVIDER_SITE_OTHER): Payer: Medicare Other | Admitting: Endocrinology

## 2011-07-23 ENCOUNTER — Encounter: Payer: Self-pay | Admitting: Endocrinology

## 2011-07-23 DIAGNOSIS — E119 Type 2 diabetes mellitus without complications: Secondary | ICD-10-CM

## 2011-07-23 MED ORDER — BROMOCRIPTINE MESYLATE 2.5 MG PO TABS: 2.5000 mg | ORAL_TABLET | Freq: Every day | ORAL | Status: DC

## 2011-07-23 NOTE — Progress Notes (Signed)
Subjective:    Patient ID: Morgan Hubbard, female    DOB: 13-Mar-1935, 77 y.o.   MRN: [id number]  HPI Pt return for f/u of type 2 DM (1997).  She is transitioning off of the amaryl, if possible.  Use of metformin and actos is limited by very low EF.  she brings a record of her cbg's which i have reviewed today.  It varies from 68-155.  It is in general lower as the day goes on.   Past Medical History  Diagnosis Date  . Arthritis   . Diabetes mellitus   . Headache   . GERD (gastroesophageal reflux disease)   . Ulcer   . Hyperlipidemia   . Migraine   . Colon polyps   . Thyroid disease   . Shingles   . Urine incontinence   . Goiter past remote    treated with RI  . Other primary cardiomyopathies   . Hyperpotassemia   . Inflammatory and toxic neuropathy, unspecified   . Reflex sympathetic dystrophy, unspecified   . Hypertonicity of bladder   . Hypertension     Past Surgical History  Procedure Date  . Breast surgery 19599    breast cyst  . Finger surgery [place]  . Tubal ligation 1976  . Kidney donation 59  . Spine surgery 1999  . Ct scan 3/12    outside hosp- lung nodule and gallstones  . Upper gastrointestinal endoscopy   . Colonoscopy 7/12    normal (hx of polyps in past)     History   Social History  . Marital Status: Married    Spouse Name: N/A    Number of Children: 2  . Years of Education: N/A   Occupational History  . retired - Production manager     Social History Main Topics  . Smoking status: Never Smoker   . Smokeless tobacco: Not on file  . Alcohol Use: Yes  . Drug Use: No  . Sexually Active: Not on file   Other Topics Concern  . Not on file   Social History Narrative   Married 2nd timeDaughter is [name]     Current Outpatient Prescriptions on File Prior to Visit  Medication Sig Dispense Refill  . amLODipine (NORVASC) 5 MG tablet Take 1 tablet (5 mg total) by mouth daily.  90 tablet  3  . aspirin 81 MG tablet Take 81 mg by mouth  daily.        . carvedilol (COREG) 12.5 MG tablet Take 12.5 mg by mouth 2 (two) times daily with a meal.        . cholecalciferol (VITAMIN D) 1000 UNITS tablet Take 1,000 Units by mouth daily.        . Cyanocobalamin (VITAMIN B 12 PO) 1015mcg  1 tablet by mouth daily/       . EPINEPHrine (EPIPEN 2-PAK IJ) Inject as directed as directed.        . furosemide (LASIX) 20 MG tablet Take 20 mg by mouth daily as needed.      . gabapentin (NEURONTIN) 300 MG capsule Take 1 capsule (300 mg total) by mouth 3 (three) times daily.  270 capsule  3  . ibuprofen (ADVIL,MOTRIN) 800 MG tablet Take 800 mg by mouth every 8 (eight) hours as needed.        . isosorbide mononitrate (IMDUR) 30 MG 24 hr tablet Take 30 mg by mouth daily.      . metFORMIN (GLUCOPHAGE-XR) 500 MG 24 hr  tablet 2 tablets by mouth twice a day.  360 tablet  3  . nitroGLYCERIN (NITROSTAT) 0.4 MG SL tablet As needed       . pantoprazole (PROTONIX) 40 MG tablet Take 1 tablet (40 mg total) by mouth daily.  90 tablet  3  . pravastatin (PRAVACHOL) 80 MG tablet Take 1 tablet (80 mg total) by mouth daily.  90 tablet  3  . sitaGLIPtin (JANUVIA) 100 MG tablet Take 1 tablet (100 mg total) by mouth daily.  30 tablet  11  . traMADol (ULTRAM) 50 MG tablet Take 1 tablet (50 mg total) by mouth every 6 (six) hours as needed for pain.  20 tablet  0    Allergies  Allergen Reactions  . Ace Inhibitors     REACTION: tongue swelling  . Codeine     REACTION: nausea  . Lisinopril Swelling    Swelling of tongue    Family History  Problem Relation Age of Onset  . Arthritis Mother   . Cancer Mother     uterine and mouth  . Hyperlipidemia Mother   . Stroke Mother   . Hypertension Mother   . Alcohol abuse Father   . Diabetes Father   . Cancer Sister     breast  . Diabetes Sister   . Cancer Brother     lung cancer  . Kidney disease Brother   . Diabetes Brother   . Hyperlipidemia Sister   . Heart disease Sister   . Hypertension Sister   . Diabetes  Sister   . Hyperlipidemia Brother   . Kidney failure Brother   . Diabetes Daughter     BP 122/78  Pulse 76  Temp(Src) 97.7 F (36.5 C) (Oral)  Ht 5\' 2"  (1.575 m)  Wt 164 lb 3.2 oz (74.481 kg)  BMI 30.03 kg/m2  SpO2 94%  LMP 06/03/1992   Review of Systems Denies loc    Objective:   Physical Exam Pulses: dorsalis pedis intact bilat.   Feet: no deformity.  no ulcer on the feet.  feet are of normal color and temp.  Tace bilat leg edema Neuro: sensation is intact to touch on the feet      Assessment & Plan:  DM.  She is ready to continue the transition off amaryl

## 2011-07-23 NOTE — Patient Instructions (Addendum)
Reduce the glimepiride to 1 mg, 2x a day.   check your blood sugar 1 time a day.  vary the time of day when you check, between before the 3 meals, and at bedtime.  also check if you have symptoms of your blood sugar being too high or too low.  please keep a record of the readings and bring it to your next appointment here.  please call us sooner if your blood sugar goes below 70, or if it stays over 200.  Please come back for a follow-up appointment for 1 month.  Please make an appointment. i have sent a prescription to your pharmacy, for an additional diabetes pill  To avoid side-effects of nausea and dizziness, start with just 1/2 pill at bedtime

## 2011-07-25 ENCOUNTER — Encounter: Payer: Self-pay | Admitting: Family Medicine

## 2011-08-07 ENCOUNTER — Ambulatory Visit (INDEPENDENT_AMBULATORY_CARE_PROVIDER_SITE_OTHER): Payer: Medicare Other | Admitting: Family Medicine

## 2011-08-07 ENCOUNTER — Encounter: Payer: Self-pay | Admitting: Family Medicine

## 2011-08-07 VITALS — BP 110/60 | HR 72 | Temp 98.1°F | Ht 62.0 in | Wt 164.1 lb

## 2011-08-07 DIAGNOSIS — R300 Dysuria: Secondary | ICD-10-CM

## 2011-08-07 DIAGNOSIS — N390 Urinary tract infection, site not specified: Secondary | ICD-10-CM

## 2011-08-07 LAB — POCT URINALYSIS DIPSTICK
Bilirubin, UA: NEGATIVE
Glucose, UA: NEGATIVE
Nitrite, UA: POSITIVE
Urobilinogen, UA: NEGATIVE

## 2011-08-07 MED ORDER — PRAVASTATIN SODIUM 80 MG PO TABS: 80.0000 mg | ORAL_TABLET | Freq: Every day | ORAL | Status: DC

## 2011-08-07 MED ORDER — CIPROFLOXACIN HCL 250 MG PO TABS: 250.0000 mg | ORAL_TABLET | Freq: Two times a day (BID) | ORAL | Status: AC

## 2011-08-07 NOTE — Progress Notes (Signed)
Discomfort, burning and hurting. Has been ongoing for a couple of days. Went to three rounds of medication.   Patient presents with burning, urgency. No vaginal discharge or external irritation.  No STD exposure. No abd pain, no flank pain.  ROS: GEN:  no fevers, chills. GI: No n/v/d, eating normally Otherwise, ROS is as per the HPI.  PHYSICAL EXAM  Blood pressure 110/60, pulse 72, temperature 98.1 F (36.7 C), temperature source Oral, height 5\' 2"  (1.575 m), weight 164 lb 1.9 oz (74.444 kg), last menstrual period 06/03/1992, SpO2 98.00%.  GEN: WDWN, A&Ox4,NAD. Non-toxic HEENT: Atraumatc, normocephalic. CV: RRR, No M/G/R PULM: CTA B, No wheezes, crackles, or rhonchi ABD: S, NT, ND, +BS, no rebound. No CVAT. No suprapubic tenderness. EXT: No c/c/e  A/P: UTI. Rx with ABX as below

## 2011-08-09 LAB — URINE CULTURE: Colony Count: NO GROWTH

## 2011-08-12 ENCOUNTER — Telehealth: Payer: Self-pay

## 2011-08-12 NOTE — Telephone Encounter (Signed)
Pt said she saw Dr Lorelei Pont 08/07/11 for UTI and Cipro has caused severe diarrhea and pt said she wonders if she should see Dr Hartley Barefoot or take the antibiotic he gave her last which she tolerated Triamethopriam. UTI symptoms with burning and urgency are no better. Pt wants Dr Alba Cory opinion. Pt uses [facility] If pharmacy needed. Pt can be reached on her cell.

## 2011-08-12 NOTE — Telephone Encounter (Signed)
Her urine culture showed no growth - which is very confusing since she still has symptoms.Marland KitchenMarland Kitchen I would recommend she follow up with Dr Gaynelle Arabian for this

## 2011-08-12 NOTE — Telephone Encounter (Signed)
Patient notified as instructed by telephone.  Pt said AZO is helping symptoms but pt will call for appt with Dr Hartley Barefoot.

## 2011-08-20 ENCOUNTER — Ambulatory Visit: Payer: Medicare Other | Admitting: Endocrinology

## 2011-10-30 ENCOUNTER — Other Ambulatory Visit: Payer: Self-pay | Admitting: Orthopedic Surgery

## 2011-10-30 DIAGNOSIS — M545 Low back pain, unspecified: Secondary | ICD-10-CM

## 2011-11-01 ENCOUNTER — Telehealth: Payer: Self-pay

## 2011-11-01 NOTE — Telephone Encounter (Signed)
Thanks- we cannot get a stat lab drawn here at this time

## 2011-11-01 NOTE — Telephone Encounter (Signed)
Pt saw Dr Hall Busing in [facility] 10/31/11. Alanza from Dr Hall Busing called pts K was 6 and hgb A1C 9.5. Dr Hall Busing wants K redrawn today. Dr [name] wrote order for [facility] to draw stat for hyperkalemia. Pt will come to office now to get order and will go to [facility] drawing station. Lab order left at front desk.

## 2011-11-03 ENCOUNTER — Telehealth: Payer: Self-pay | Admitting: Family Medicine

## 2011-11-03 NOTE — Telephone Encounter (Signed)
I have repeatedly called [facility] - several phone numbers to get pt's K result and am unable to get through or get a result Have spoken with the pt who feels fine and has cut out high K food and drinks  Given parameters to call back Will let her know as soon as we get a result

## 2011-11-04 ENCOUNTER — Ambulatory Visit (INDEPENDENT_AMBULATORY_CARE_PROVIDER_SITE_OTHER): Payer: Medicare Other | Admitting: Family Medicine

## 2011-11-04 ENCOUNTER — Encounter: Payer: Self-pay | Admitting: Family Medicine

## 2011-11-04 ENCOUNTER — Ambulatory Visit: Payer: Medicare Other | Admitting: Internal Medicine

## 2011-11-04 ENCOUNTER — Telehealth: Payer: Self-pay | Admitting: Family Medicine

## 2011-11-04 VITALS — BP 110/60 | HR 60 | Temp 97.6°F | Wt 161.2 lb

## 2011-11-04 DIAGNOSIS — E875 Hyperkalemia: Secondary | ICD-10-CM

## 2011-11-04 DIAGNOSIS — E119 Type 2 diabetes mellitus without complications: Secondary | ICD-10-CM

## 2011-11-04 DIAGNOSIS — N289 Disorder of kidney and ureter, unspecified: Secondary | ICD-10-CM | POA: Insufficient documentation

## 2011-11-04 LAB — POCT URINALYSIS DIPSTICK
Ketones, UA: NEGATIVE
Spec Grav, UA: 1.025
Urobilinogen, UA: 0.2
pH, UA: 6

## 2011-11-04 MED ORDER — SODIUM POLYSTYRENE SULFONATE 15 GM/60ML PO SUSP: ORAL | Status: DC

## 2011-11-04 NOTE — Assessment & Plan Note (Signed)
With one kidney s/p donation  Will stop nsaid and trimethoprim ua  Also high K  Re check wed/ thurs

## 2011-11-04 NOTE — Assessment & Plan Note (Addendum)
Could be due to poor gluc control and also renal insuff and high K diet  Given kayexelate for qd use - re check wed or early thurs Watching renal fxn closely No acute changes on EKG today

## 2011-11-04 NOTE — Assessment & Plan Note (Signed)
Not optimal control-  ? If could add to high K  Cr is elevated  If no imp after stopping nsaid/ trimethoprim will need to hold her metformin

## 2011-11-04 NOTE — Progress Notes (Signed)
Subjective:    Patient ID: Morgan Hubbard, female    DOB: [birth year], 77 y.o.   MRN: [id number]  HPI Is here for a high K of 6.0- that then went up to 6.3 Was feeling fine until this am  Ate cereal and a peach and got in the hot tub  Sugar was 143 Felt weak for a little white Fine now  Ate some cheese toast- now feels fine  No cp or palpitations   K of 6.3 Tends to eat high K foods   DM is not in good control  Has been on prednisone a1c up to 9.5 from 9.1   Is on prednisone for back pain  Having MRI soon for ongoing back pain   She has seen Dr Gaynelle Arabian for urology in the past  Has had renal ultrasounds in the past  Given vesicare- and that does not help her overactive bladder  Also has had a terrible uti for weeks -- is on trimethoprim to prevent infx Does not think she has infection right now   Is on ibuprofen over the counter   Patient Active Problem List  Diagnoses  . DM  . HYPERCHOLESTEROLEMIA  . HYPERKALEMIA  . REFLEX SYMPATHETIC DYSTROPHY  . POLYNEUROPATHY  . HYPERTENSION  . CARDIOMYOPATHY  . OVERACTIVE BLADDER  . Fatty liver  . Left sided abdominal pain  . Tongue swelling  . Goiter  . UTI (lower urinary tract infection)  . Hematuria  . Left shoulder pain  . Renal insufficiency   Past Medical History  Diagnosis Date  . Arthritis   . Diabetes mellitus   . Headache   . GERD (gastroesophageal reflux disease)   . Ulcer   . Hyperlipidemia   . Migraine   . Colon polyps   . Thyroid disease   . Shingles   . Urine incontinence   . Goiter past remote    treated with RI  . Other primary cardiomyopathies   . Hyperpotassemia   . Inflammatory and toxic neuropathy, unspecified   . Reflex sympathetic dystrophy, unspecified   . Hypertonicity of bladder   . Hypertension    Past Surgical History  Procedure Date  . Breast surgery 19599    breast cyst  . Finger surgery [place]  . Tubal ligation 1976  . Kidney donation 21  . Spine surgery 1999    . Ct scan 3/12    outside hosp- lung nodule and gallstones  . Upper gastrointestinal endoscopy   . Colonoscopy 7/12    normal (hx of polyps in past)    History  Substance Use Topics  . Smoking status: Never Smoker   . Smokeless tobacco: Never Used  . Alcohol Use: No   Family History  Problem Relation Age of Onset  . Arthritis Mother   . Cancer Mother     uterine and mouth  . Hyperlipidemia Mother   . Stroke Mother   . Hypertension Mother   . Alcohol abuse Father   . Diabetes Father   . Cancer Sister     breast  . Diabetes Sister   . Cancer Brother     lung cancer  . Kidney disease Brother   . Diabetes Brother   . Hyperlipidemia Sister   . Heart disease Sister   . Hypertension Sister   . Diabetes Sister   . Hyperlipidemia Brother   . Kidney failure Brother   . Diabetes Daughter    Allergies  Allergen Reactions  . Ace Inhibitors  REACTION: tongue swelling  . Codeine     REACTION: nausea  . Lisinopril Swelling    Swelling of tongue   Current Outpatient Prescriptions on File Prior to Visit  Medication Sig Dispense Refill  . amLODipine (NORVASC) 5 MG tablet Take 1 tablet (5 mg total) by mouth daily.  90 tablet  3  . aspirin 81 MG tablet Take 81 mg by mouth daily.        . bromocriptine (PARLODEL) 2.5 MG tablet Take 1 tablet (2.5 mg total) by mouth at bedtime.  90 tablet  3  . carvedilol (COREG) 12.5 MG tablet Take 12.5 mg by mouth 2 (two) times daily with a meal.        . cholecalciferol (VITAMIN D) 1000 UNITS tablet Take 1,000 Units by mouth daily.        . Cyanocobalamin (VITAMIN B 12 PO) 1021mcg  1 tablet by mouth daily/       . EPINEPHrine (EPIPEN 2-PAK IJ) Inject as directed as directed.        . furosemide (LASIX) 20 MG tablet Take 20 mg by mouth daily as needed.      . gabapentin (NEURONTIN) 300 MG capsule Take 1 capsule (300 mg total) by mouth 3 (three) times daily.  270 capsule  3  . ibuprofen (ADVIL,MOTRIN) 800 MG tablet Take 800 mg by mouth every 8  (eight) hours as needed.        . isosorbide mononitrate (IMDUR) 30 MG 24 hr tablet Take 30 mg by mouth daily.      . metFORMIN (GLUCOPHAGE-XR) 500 MG 24 hr tablet 2 tablets by mouth twice a day.  360 tablet  3  . nitroGLYCERIN (NITROSTAT) 0.4 MG SL tablet As needed       . pantoprazole (PROTONIX) 40 MG tablet Take 1 tablet (40 mg total) by mouth daily.  90 tablet  3  . pravastatin (PRAVACHOL) 80 MG tablet Take 1 tablet (80 mg total) by mouth daily.  90 tablet  3  . traMADol (ULTRAM) 50 MG tablet Take 1 tablet (50 mg total) by mouth every 6 (six) hours as needed for pain.  20 tablet  0        Review of Systems Review of Systems  Constitutional: Negative for fever, appetite change, fatigue and unexpected weight change.  Eyes: Negative for pain and visual disturbance.  Respiratory: Negative for cough and shortness of breath.   Cardiovascular: Negative for cp or palpitations    Gastrointestinal: Negative for nausea, diarrhea and constipation.  Genitourinary: Negative for urgency and frequency. no blood in urine/ bladder or back pain  Skin: Negative for pallor or rash   Neurological: Negative for weakness, light-headedness, numbness and headaches.  Hematological: Negative for adenopathy. Does not bruise/bleed easily.  Psychiatric/Behavioral: Negative for dysphoric mood. The patient is not nervous/anxious.         Objective:   Physical Exam  Constitutional: She appears well-developed and well-nourished. No distress.  HENT:  Head: Normocephalic and atraumatic.  Mouth/Throat: Oropharynx is clear and moist.  Eyes: Conjunctivae and EOM are normal. Pupils are equal, round, and reactive to light. No scleral icterus.  Neck: Normal range of motion. Neck supple. No JVD present. Carotid bruit is not present. No thyromegaly present.  Cardiovascular: Normal rate, regular rhythm, normal heart sounds and intact distal pulses.   Pulmonary/Chest: Effort normal and breath sounds normal. No respiratory  distress. She has no wheezes. She has no rales.  Abdominal: Soft. Bowel sounds are normal. She  exhibits no distension, no abdominal bruit and no mass. There is no tenderness.       No suprapubic tenderness    Musculoskeletal: She exhibits no edema and no tenderness.  Lymphadenopathy:    She has no cervical adenopathy.  Neurological: She is alert. She has normal reflexes. No cranial nerve deficit. She exhibits normal muscle tone. Coordination normal.  Skin: Skin is warm and dry. No rash noted. No erythema. No pallor.  Psychiatric: She has a normal mood and affect.       Cheerful and talkative           Assessment & Plan:

## 2011-11-04 NOTE — Patient Instructions (Signed)
For your kidney- stop ibuprofen or any other anti inflammatories  Also stop the trimethoprim  Give urine specimen on way out  Make sure to drink enough water  Watch diet closely for sugar and also oranges/ bananas or cantelope  Take the kayexelate as directed  Schedule labs for late Wednesday or early Thursday- renal panel

## 2011-11-04 NOTE — Telephone Encounter (Signed)
I am waiting on a K level from [facility] that was done on Friday- no success getting it by phone, could you please call for this, thanks  Copy needs to go to her cardiol ([facility] in [place] phone [phone]

## 2011-11-05 LAB — POCT UA - MICROSCOPIC ONLY
Casts, Ur, LPF, POC: 0
Mucus, UA: 0
Yeast, UA: 0

## 2011-11-06 ENCOUNTER — Encounter: Payer: Self-pay | Admitting: Family Medicine

## 2011-11-06 ENCOUNTER — Other Ambulatory Visit: Payer: Medicare Other

## 2011-11-07 ENCOUNTER — Other Ambulatory Visit (INDEPENDENT_AMBULATORY_CARE_PROVIDER_SITE_OTHER): Payer: Medicare Other

## 2011-11-07 DIAGNOSIS — E875 Hyperkalemia: Secondary | ICD-10-CM

## 2011-11-07 DIAGNOSIS — N289 Disorder of kidney and ureter, unspecified: Secondary | ICD-10-CM

## 2011-11-07 LAB — RENAL FUNCTION PANEL
BUN: 16 mg/dL (ref 6–23)
CO2: 32 mEq/L (ref 19–32)
Chloride: 106 mEq/L (ref 96–112)
Creatinine, Ser: 0.9 mg/dL (ref 0.4–1.2)
GFR: 65.73 mL/min (ref >60.00)
Phosphorus: 3 mg/dL (ref 2.3–4.6)
Potassium: 4.5 mEq/L (ref 3.5–5.1)
Sodium: 145 mEq/L (ref 135–145)

## 2011-11-12 ENCOUNTER — Telehealth: Payer: Self-pay

## 2011-11-12 MED ORDER — TOLTERODINE TARTRATE ER 4 MG PO CP24: 4.0000 mg | ORAL_CAPSULE | Freq: Every day | ORAL | Status: DC

## 2011-11-12 NOTE — Telephone Encounter (Signed)
Pt request renal results copy mailed to pt and faxed to Dr Varney Baas. Pt scheduled f/u with Dr [name] 12/10/11. Pt request med for urinary incontinence; when pt gets first urge to urinate cannot get to bathroom before urinates. Pt was on Detrol and worked well. [place]

## 2011-11-12 NOTE — Telephone Encounter (Signed)
LMOM to inform patient w/contact name & number/SLS

## 2011-11-12 NOTE — Telephone Encounter (Signed)
Can try detrol la  Will refill electronically  Update if side eff or probems (can cause constipation and dry mouth)

## 2011-11-25 ENCOUNTER — Ambulatory Visit
Admission: RE | Admit: 2011-11-25 | Discharge: 2011-11-25 | Disposition: A | Discharge status: Home / Self Care | Payer: Medicare Other | Source: Ambulatory Visit | Attending: Orthopedic Surgery | Admitting: Orthopedic Surgery

## 2011-11-25 DIAGNOSIS — M545 Low back pain, unspecified: Secondary | ICD-10-CM

## 2011-11-27 ENCOUNTER — Other Ambulatory Visit: Payer: Medicare Other

## 2011-12-10 ENCOUNTER — Encounter: Payer: Self-pay | Admitting: Family Medicine

## 2011-12-10 ENCOUNTER — Ambulatory Visit (INDEPENDENT_AMBULATORY_CARE_PROVIDER_SITE_OTHER): Payer: Medicare Other | Admitting: Family Medicine

## 2011-12-10 VITALS — BP 130/70 | HR 68 | Temp 97.6°F | Ht 62.0 in | Wt 160.8 lb

## 2011-12-10 DIAGNOSIS — N289 Disorder of kidney and ureter, unspecified: Secondary | ICD-10-CM

## 2011-12-10 DIAGNOSIS — E875 Hyperkalemia: Secondary | ICD-10-CM

## 2011-12-10 DIAGNOSIS — N3946 Mixed incontinence: Secondary | ICD-10-CM

## 2011-12-10 DIAGNOSIS — M48061 Spinal stenosis, lumbar region without neurogenic claudication: Secondary | ICD-10-CM | POA: Insufficient documentation

## 2011-12-10 DIAGNOSIS — I10 Essential (primary) hypertension: Secondary | ICD-10-CM

## 2011-12-10 LAB — COMPREHENSIVE METABOLIC PANEL
ALT: 22 U/L (ref 0–35)
AST: 18 U/L (ref 0–37)
CO2: 31 mEq/L (ref 19–32)
Creatinine, Ser: 1.1 mg/dL (ref 0.4–1.2)
Glucose, Bld: 415 mg/dL — ABNORMAL HIGH (ref 70–99)
Potassium: 5.4 mEq/L — ABNORMAL HIGH (ref 3.5–5.1)
Sodium: 139 mEq/L (ref 135–145)
Total Bilirubin: 0.6 mg/dL (ref 0.3–1.2)
Total Protein: 6.5 g/dL (ref 6.0–8.3)

## 2011-12-10 NOTE — Assessment & Plan Note (Signed)
Up just a bit with current prednisone- but still in control bp in fair control at this time  No changes needed  Disc lifstyle change with low sodium diet and exercise   Lab today

## 2011-12-10 NOTE — Patient Instructions (Addendum)
Call your insurance to see what incontinence/ bladder control medicines are covered and let me know  Also probably a good idea to see your urologist  Labs today for kidney and potassium Continue follow ups for your diabetes and watch diet  Good luck with your back and neuro surgery appt

## 2011-12-10 NOTE — Assessment & Plan Note (Signed)
S/p 2 surgeries in past -worse now , getting ref to neurosurg from Dr =Voytek I signed handicapped sticker form as pt is limited in ambulation

## 2011-12-10 NOTE — Progress Notes (Signed)
Subjective:    Patient ID: Morgan Hubbard, female    DOB: 1935/05/18, 77 y.o.   MRN: [id number]  HPI Here for f/u of chronic conditions   Having a lot of trouble with her back - spinal stenosis is worse -and she had surgery  Sees Dr Lynann Bologna  Will be seeing neurosurg- Dr Lynann Bologna will do a referral - her prev doctor in [place] retired  Pain is disabling, needs handicapped sticker signed   Last visit investigated inc cr and K - thought to be due to trimethoprim and also nsaid K returned to nl with kayexelate   Chemistry      Component Value Date/Time   NA 145 11/07/2011 0814   K 4.5 11/07/2011 0814   CL 106 11/07/2011 0814   CO2 32 11/07/2011 0814   BUN 16 11/07/2011 0814   CREATININE 0.9 11/07/2011 0814      Component Value Date/Time   CALCIUM 8.9 11/07/2011 0814   ALKPHOS 91 01/14/2011 1643   AST 19 01/14/2011 1643   ALT 21 01/14/2011 1643   BILITOT 0.4 01/14/2011 1643     does have issues with urinary incontinence Has not been back to the urologist-and has to use pad all the time  No symptoms of infection however   DM- sees Dr Loanne Drilling Lab Results  Component Value Date   HGBA1C 9.2* 06/21/2011   went up after prednisone - still on it right now for her back  Sugar was 180 this am - ate cup cake last night after dinner    Also cr was nl  Has just one kidney  bp is up a bit     Today-- due to prednisone  BP Readings from Last 3 Encounters:  12/10/11 139/65  11/04/11 110/60  08/07/11 110/60    No cp or palpitations or headaches or edema  No side effects to medicines    Patient Active Problem List  Diagnosis  . DM  . HYPERCHOLESTEROLEMIA  . HYPERKALEMIA  . REFLEX SYMPATHETIC DYSTROPHY  . POLYNEUROPATHY  . HYPERTENSION  . CARDIOMYOPATHY  . OVERACTIVE BLADDER  . Fatty liver  . Left sided abdominal pain  . Tongue swelling  . Goiter  . Hematuria  . Left shoulder pain  . Renal insufficiency  . Spinal stenosis of lumbar region  . Mixed incontinence urge and stress   Past  Medical History  Diagnosis Date  . Arthritis   . Diabetes mellitus   . Headache   . GERD (gastroesophageal reflux disease)   . Ulcer   . Hyperlipidemia   . Migraine   . Colon polyps   . Thyroid disease   . Shingles   . Urine incontinence   . Goiter past remote    treated with RI  . Other primary cardiomyopathies   . Hyperpotassemia   . Inflammatory and toxic neuropathy, unspecified   . Reflex sympathetic dystrophy, unspecified   . Hypertonicity of bladder   . Hypertension    Past Surgical History  Procedure Date  . Breast surgery 19599    breast cyst  . Finger surgery [place]  . Tubal ligation 1976  . Kidney donation 38  . Spine surgery 1999  . Ct scan 3/12    outside hosp- lung nodule and gallstones  . Upper gastrointestinal endoscopy   . Colonoscopy 7/12    normal (hx of polyps in past)    History  Substance Use Topics  . Smoking status: Never Smoker   . Smokeless tobacco:  Never Used  . Alcohol Use: No   Family History  Problem Relation Age of Onset  . Arthritis Mother   . Cancer Mother     uterine and mouth  . Hyperlipidemia Mother   . Stroke Mother   . Hypertension Mother   . Alcohol abuse Father   . Diabetes Father   . Cancer Sister     breast  . Diabetes Sister   . Cancer Brother     lung cancer  . Kidney disease Brother   . Diabetes Brother   . Hyperlipidemia Sister   . Heart disease Sister   . Hypertension Sister   . Diabetes Sister   . Hyperlipidemia Brother   . Kidney failure Brother   . Diabetes Daughter    Allergies  Allergen Reactions  . Ace Inhibitors     REACTION: tongue swelling  . Codeine     REACTION: nausea  . Lisinopril Swelling    Swelling of tongue   Current Outpatient Prescriptions on File Prior to Visit  Medication Sig Dispense Refill  . amLODipine (NORVASC) 5 MG tablet Take 1 tablet (5 mg total) by mouth daily.  90 tablet  3  . aspirin 81 MG tablet Take 81 mg by mouth daily.        . carvedilol (COREG) 12.5  MG tablet Take 12.5 mg by mouth 2 (two) times daily with a meal.        . cholecalciferol (VITAMIN D) 1000 UNITS tablet Take 1,000 Units by mouth daily.        . Cyanocobalamin (VITAMIN B 12 PO) 1044mcg  1 tablet by mouth daily/       . EPINEPHrine (EPIPEN 2-PAK IJ) Inject as directed as directed.        . furosemide (LASIX) 20 MG tablet Take 20 mg by mouth daily as needed.      . gabapentin (NEURONTIN) 300 MG capsule Take 1 capsule (300 mg total) by mouth 3 (three) times daily.  270 capsule  3  . glimepiride (AMARYL) 2 MG tablet Take 2 mg by mouth 2 (two) times daily.      . hydrocodone-acetaminophen (HYCET) 7.5-325 MG/15ML solution Take by mouth 2 (two) times daily as needed.      . isosorbide mononitrate (IMDUR) 30 MG 24 hr tablet Take 30 mg by mouth daily.      . metFORMIN (GLUCOPHAGE-XR) 500 MG 24 hr tablet 2 tablets by mouth twice a day.  360 tablet  3  . nitroGLYCERIN (NITROSTAT) 0.4 MG SL tablet As needed       . pantoprazole (PROTONIX) 40 MG tablet Take 1 tablet (40 mg total) by mouth daily.  90 tablet  3  . pravastatin (PRAVACHOL) 80 MG tablet Take 1 tablet (80 mg total) by mouth daily.  90 tablet  3  . predniSONE (DELTASONE) 10 MG tablet Take 10 mg by mouth daily.      . bromocriptine (PARLODEL) 2.5 MG tablet Take 1 tablet (2.5 mg total) by mouth at bedtime.  90 tablet  3  . ibuprofen (ADVIL,MOTRIN) 800 MG tablet Take 800 mg by mouth every 8 (eight) hours as needed.        . sodium polystyrene (KAYEXALATE) 15 GM/60ML suspension Take 15 grams by mouth once daily  500 mL  0  . tolterodine (DETROL LA) 4 MG 24 hr capsule Take 1 capsule (4 mg total) by mouth daily.  30 capsule  3      Review of Systems Review  of Systems  Constitutional: Negative for fever, appetite change, and unexpected weight change.  Eyes: Negative for pain and visual disturbance.  Respiratory: Negative for cough and shortness of breath.   Cardiovascular: Negative for cp or palpitations    Gastrointestinal:  Negative for nausea, diarrhea and constipation.  Genitourinary: Negative for urgency and frequency.  Skin: Negative for pallor or rash   MSK pos for chronic back pain  Neurological: Negative for weakness, light-headedness, numbness and headaches.  Hematological: Negative for adenopathy. Does not bruise/bleed easily.  Psychiatric/Behavioral: Negative for dysphoric mood. The patient is not nervous/anxious.         Objective:   Physical Exam  Constitutional: She appears well-developed and well-nourished. No distress.  HENT:  Head: Normocephalic and atraumatic.  Mouth/Throat: Oropharynx is clear and moist.  Eyes: Conjunctivae and EOM are normal. Pupils are equal, round, and reactive to light. No scleral icterus.  Neck: Normal range of motion. Neck supple. No JVD present. Carotid bruit is not present. No thyromegaly present.  Cardiovascular: Normal rate, regular rhythm, normal heart sounds and intact distal pulses.  Exam reveals no gallop.   Pulmonary/Chest: Effort normal and breath sounds normal. No respiratory distress. She has no wheezes.  Abdominal: Soft. Bowel sounds are normal. She exhibits no distension, no abdominal bruit and no mass. There is no tenderness.  Musculoskeletal: She exhibits tenderness. She exhibits no edema.       Poor rom of spine in its entirety  Gait affected    Lymphadenopathy:    She has no cervical adenopathy.  Neurological: She is alert. She has normal reflexes. Coordination normal.  Skin: Skin is warm and dry. No rash noted. No erythema. No pallor.  Psychiatric: She has a normal mood and affect.          Assessment & Plan:

## 2011-12-10 NOTE — Assessment & Plan Note (Signed)
Level today - with better cr and off nsaid and with imp sugar  Did have several doses of kayexelate in early june

## 2011-12-10 NOTE — Assessment & Plan Note (Signed)
This is worse - detrol LA is no longer helpful Pt will check into other covered opt from insurance Also will f/u with urologist ? If spinal stenosis is worsening this at all ?

## 2011-12-10 NOTE — Assessment & Plan Note (Signed)
Re check profile today - off nsaid but still on metformin with good bp  Enc water intake

## 2012-01-22 ENCOUNTER — Encounter (HOSPITAL_COMMUNITY): Payer: Self-pay | Admitting: Respiratory Therapy

## 2012-01-22 ENCOUNTER — Other Ambulatory Visit: Payer: Self-pay | Admitting: Neurological Surgery

## 2012-01-27 ENCOUNTER — Encounter (HOSPITAL_COMMUNITY)
Admission: RE | Admit: 2012-01-27 | Discharge: 2012-01-27 | Disposition: A | Discharge status: Home / Self Care | Payer: Medicare Other | Source: Ambulatory Visit | Attending: Neurological Surgery | Admitting: Neurological Surgery

## 2012-01-27 ENCOUNTER — Encounter (HOSPITAL_COMMUNITY): Payer: Self-pay

## 2012-01-27 HISTORY — DX: Dizziness and giddiness: R42

## 2012-01-27 HISTORY — DX: Overactive bladder: N32.81

## 2012-01-27 HISTORY — DX: Migraine, unspecified, not intractable, without status migrainosus: G43.909

## 2012-01-27 HISTORY — DX: Shortness of breath: R06.02

## 2012-01-27 LAB — CBC WITH DIFFERENTIAL/PLATELET
Basophils Relative: 0 % (ref 0–1)
Eosinophils Absolute: 0.2 10*3/uL (ref 0.0–0.7)
Eosinophils Relative: 2 % (ref 0–5)
HCT: 41.5 % (ref 36.0–46.0)
Hemoglobin: 14 g/dL (ref 12.0–15.0)
Lymphocytes Relative: 29 % (ref 12–46)
Lymphs Abs: 3.2 10*3/uL (ref 0.7–4.0)
MCH: 30.2 pg (ref 26.0–34.0)
MCV: 89.6 fL (ref 78.0–100.0)
Monocytes Relative: 5 % (ref 3–12)
Platelets: 248 10*3/uL (ref 150–400)
RBC: 4.63 MIL/uL (ref 3.87–5.11)
RDW: 12.7 % (ref 11.5–15.5)

## 2012-01-27 LAB — BASIC METABOLIC PANEL
CO2: 28 mEq/L (ref 19–32)
Chloride: 101 mEq/L (ref 96–112)
Creatinine, Ser: 1.23 mg/dL — ABNORMAL HIGH (ref 0.50–1.10)
GFR calc Af Amer: 48 mL/min — ABNORMAL LOW (ref >90)
Glucose, Bld: 242 mg/dL — ABNORMAL HIGH (ref 70–99)
Potassium: 5.2 mEq/L — ABNORMAL HIGH (ref 3.5–5.1)
Sodium: 140 mEq/L (ref 135–145)

## 2012-01-27 LAB — SURGICAL PCR SCREEN: MRSA, PCR: NEGATIVE

## 2012-01-27 NOTE — Pre-Procedure Instructions (Signed)
[place]  01/27/2012   Your procedure is scheduled on:  Friday January 31, 2012.  Report to [place] at 0900 AM.  Call this number if you have problems the morning of surgery: [phone]   Remember:   Do not eat food or drink:After Midnight.    Take these medicines the morning of surgery with A SIP OF WATER: Amlodipine (Norvasc), Carvedilol (Coreg), Gabapentin (Neurontin), Hydrocodone (Vicodin) if needed for pain, Isosorbide (Imdur), and Pantoprazole (Protonix)   Do not take any diabetic medications the morning of your surgery.   Do not wear jewelry, make-up or nail polish.  Do not wear lotions, powders, or perfumes.   Do not shave 48 hours prior to surgery.   Do not bring valuables to the hospital.  Contacts, dentures or bridgework may not be worn into surgery.  Leave suitcase in the car. After surgery it may be brought to your room.  For patients admitted to the hospital, checkout time is 11:00 AM the day of discharge.   Patients discharged the day of surgery will not be allowed to drive home.  Name and phone number of your driver:   Special Instructions: CHG Shower Use Special Wash: 1/2 bottle night before surgery and 1/2 bottle morning of surgery.   Please read over the following fact sheets that you were given: Pain Booklet, Coughing and Deep Breathing, MRSA Information and Surgical Site Infection Prevention

## 2012-01-27 NOTE — Progress Notes (Signed)
Patient informed Nurse that she had two Cardiologist; Dr. Hall Busing at [facility] in [place], [place] # [phone], fax # [phone] and Dr. Leonides Schanz at [facility] # [phone]; direct # [phone] Fax # [phone]. All cardiac notes requested from both Physicians. Patient informed Nurse that she had a left BBB for years. Patient denied having a sleep study but informed Nurse that she had a cardiac cath without any stent placement.

## 2012-01-28 NOTE — Consult Note (Addendum)
Anesthesia chart. He: Patient is a 77 year old female scheduled for T12-L1, L1-2 laminectomy on 01/31/2012 by Dr. Ronnald Ramp.  History includes non-smoker, non-ischemic CM, known left BBB, DM2, migraines, HTN, goiter, HLD, GERD, vertigo, angioedema with ACEI, single kidney s/p donation to her brother.  She is followed by Cardiologist at [facility] (Dr. Joyice Faster) and at [facility] (Dr. Hall Busing).   PCP is Dr. [name].    Records from [facility]) include an office note from 11/03/11, stress and echo from December 2012 and cardiac cath from March 2012.  Records from [facility] include an office note from 11/04/11 with a summary of 07/05/11 echo, PFTs, and CXR.  EKG on 11/04/11 showed SR, left BBB, LAE.  It appears stable when compared to the 05/03/11 from [facility].  According to the 11/04/11 [facility] office note, her echo on 07/05/11 showed LVEF 45%, mild LVH, mild MR, trivial TR.  Stress test on 05/13/11 [facility]) showed a mildly dilated LV cavity with evidence of a larger perfusion abnormality involving the inferoseptal, septal, and apical wall consistent with a combination of left BBB and dense breast attenuation. No evidence for ischemia was noted.  Preserved EF of 55% with septal and apical wall hypokinesis.  Cardiac cath from 08/30/10 [facility]) showed normal coronary anatomy, no significant CAD (20% ostial RCA, minor luminal irregularities of the LAD and CX), mild dilated cardiomyopathy with EF 50%, improved compared to previous studies.   CXR from 07/05/11 ([facility]) showed unchanged cardiomegaly with the mediastinum within normal limits, linear opacity in the lateral left lower lung likely representing atelectasis, continued nodular opacity overlying the right second anterior rib, no focal consolidative opacities identified, pleural spaces within normal limits, degenerative changes of the thoracic spine and bilateral acromioclavicular joints.  PFTs ([facility]) on 07/05/11 showed FEV1 1.90 (72%  predicted).  Interpretation read, "The flow volume loop and the isolated reduction in the expiratory flows at low lung volumes are consistent with small airway disease. The reduction in maximum voluntary ventilation reflects a reduced FEV1. Lung volumes are normal. The diffusing capacity for carbon monoxide is borderline reduced. This interpretation of diffusing capacity does not take into account the patient's hemoglobin (unavailable at the time of testing and)."  Labs noted.  K 5.2, Cr 1.23, glucose 242, WBC 11.3, H/H WNL.  If not significant change in her status, then anticipate she can proceed as planned.  Myra Gianotti, PA-C

## 2012-01-30 MED ORDER — CEFAZOLIN SODIUM-DEXTROSE 2-3 GM-% IV SOLR
2.0000 g | INTRAVENOUS | Status: AC
  Administered 2012-01-31: 2 g via INTRAVENOUS
  Filled 2012-01-30: qty 50

## 2012-01-31 ENCOUNTER — Ambulatory Visit (HOSPITAL_COMMUNITY): Payer: Medicare Other

## 2012-01-31 ENCOUNTER — Ambulatory Visit (HOSPITAL_COMMUNITY)
Admission: RE | Admit: 2012-01-31 | Discharge: 2012-02-01 | Disposition: A | Discharge status: Home / Self Care | Payer: Medicare Other | Source: Ambulatory Visit | Attending: Neurological Surgery | Admitting: Neurological Surgery

## 2012-01-31 ENCOUNTER — Encounter (HOSPITAL_COMMUNITY): Payer: Self-pay | Admitting: Vascular Surgery

## 2012-01-31 ENCOUNTER — Encounter (HOSPITAL_COMMUNITY): Payer: Self-pay | Admitting: *Deleted

## 2012-01-31 ENCOUNTER — Encounter (HOSPITAL_COMMUNITY)
Admission: RE | Disposition: A | Discharge status: Home / Self Care | Payer: Self-pay | Source: Ambulatory Visit | Attending: Neurological Surgery

## 2012-01-31 ENCOUNTER — Encounter (HOSPITAL_COMMUNITY): Payer: Self-pay | Admitting: Neurological Surgery

## 2012-01-31 ENCOUNTER — Ambulatory Visit (HOSPITAL_COMMUNITY): Payer: Medicare Other | Admitting: Vascular Surgery

## 2012-01-31 DIAGNOSIS — I10 Essential (primary) hypertension: Secondary | ICD-10-CM | POA: Insufficient documentation

## 2012-01-31 DIAGNOSIS — E119 Type 2 diabetes mellitus without complications: Secondary | ICD-10-CM | POA: Insufficient documentation

## 2012-01-31 DIAGNOSIS — M4804 Spinal stenosis, thoracic region: Secondary | ICD-10-CM | POA: Insufficient documentation

## 2012-01-31 DIAGNOSIS — Z905 Acquired absence of kidney: Secondary | ICD-10-CM | POA: Insufficient documentation

## 2012-01-31 DIAGNOSIS — K219 Gastro-esophageal reflux disease without esophagitis: Secondary | ICD-10-CM | POA: Insufficient documentation

## 2012-01-31 DIAGNOSIS — Z01812 Encounter for preprocedural laboratory examination: Secondary | ICD-10-CM | POA: Insufficient documentation

## 2012-01-31 HISTORY — PX: LUMBAR LAMINECTOMY/DECOMPRESSION MICRODISCECTOMY: SHX5026

## 2012-01-31 LAB — GLUCOSE, CAPILLARY: Glucose-Capillary: 195 mg/dL — ABNORMAL HIGH (ref 70–99)

## 2012-01-31 SURGERY — LUMBAR LAMINECTOMY/DECOMPRESSION MICRODISCECTOMY 2 LEVELS
Anesthesia: General | Site: Back | Wound class: Clean

## 2012-01-31 MED ORDER — METHOCARBAMOL 100 MG/ML IJ SOLN
500.0000 mg | Freq: Four times a day (QID) | INTRAVENOUS | Status: DC | PRN
  Filled 2012-01-31: qty 5

## 2012-01-31 MED ORDER — PROPOFOL 10 MG/ML IV EMUL
INTRAVENOUS | Status: DC | PRN
  Administered 2012-01-31: 150 mg via INTRAVENOUS

## 2012-01-31 MED ORDER — MENTHOL 3 MG MT LOZG: 1.0000 | LOZENGE | OROMUCOSAL | Status: DC | PRN

## 2012-01-31 MED ORDER — SODIUM CHLORIDE 0.9 % IJ SOLN
3.0000 mL | Freq: Two times a day (BID) | INTRAMUSCULAR | Status: DC
  Administered 2012-02-01: 3 mL via INTRAVENOUS

## 2012-01-31 MED ORDER — PHENOL 1.4 % MT LIQD: 1.0000 | OROMUCOSAL | Status: DC | PRN

## 2012-01-31 MED ORDER — LIDOCAINE HCL (CARDIAC) 20 MG/ML IV SOLN
INTRAVENOUS | Status: DC | PRN
  Administered 2012-01-31: 100 mg via INTRAVENOUS

## 2012-01-31 MED ORDER — METFORMIN HCL ER 500 MG PO TB24
500.0000 mg | ORAL_TABLET | Freq: Every day | ORAL | Status: DC
  Administered 2012-02-01: 500 mg via ORAL
  Filled 2012-01-31 (×2): qty 1

## 2012-01-31 MED ORDER — SENNA 8.6 MG PO TABS
1.0000 | ORAL_TABLET | Freq: Two times a day (BID) | ORAL | Status: DC
  Administered 2012-02-01: 8.6 mg via ORAL
  Filled 2012-01-31 (×2): qty 1

## 2012-01-31 MED ORDER — SODIUM CHLORIDE 0.9 % IJ SOLN: 3.0000 mL | INTRAMUSCULAR | Status: DC | PRN

## 2012-01-31 MED ORDER — ISOSORBIDE MONONITRATE ER 30 MG PO TB24
30.0000 mg | ORAL_TABLET | Freq: Every day | ORAL | Status: DC
  Administered 2012-02-01: 30 mg via ORAL
  Filled 2012-01-31: qty 1

## 2012-01-31 MED ORDER — CEFAZOLIN SODIUM 1-5 GM-% IV SOLN
1.0000 g | Freq: Three times a day (TID) | INTRAVENOUS | Status: AC
  Administered 2012-01-31 – 2012-02-01 (×2): 1 g via INTRAVENOUS
  Filled 2012-01-31 (×2): qty 50

## 2012-01-31 MED ORDER — HEMOSTATIC AGENTS (NO CHARGE) OPTIME
TOPICAL | Status: DC | PRN
  Administered 2012-01-31: 1 via TOPICAL

## 2012-01-31 MED ORDER — FESOTERODINE FUMARATE ER 8 MG PO TB24
8.0000 mg | ORAL_TABLET | Freq: Every day | ORAL | Status: DC
Start: 2012-01-31 — End: 2012-02-01
  Administered 2012-01-31: 8 mg via ORAL
  Filled 2012-01-31 (×2): qty 1

## 2012-01-31 MED ORDER — PHENYLEPHRINE HCL 10 MG/ML IJ SOLN
INTRAMUSCULAR | Status: DC | PRN
  Administered 2012-01-31: 80 ug via INTRAVENOUS
  Administered 2012-01-31 (×3): 40 ug via INTRAVENOUS
  Administered 2012-01-31 (×3): 80 ug via INTRAVENOUS

## 2012-01-31 MED ORDER — SODIUM CHLORIDE 0.9 % IR SOLN
Status: DC | PRN
  Administered 2012-01-31: 13:00:00

## 2012-01-31 MED ORDER — HYDROMORPHONE HCL PF 1 MG/ML IJ SOLN
INTRAMUSCULAR | Status: AC
  Filled 2012-01-31: qty 1

## 2012-01-31 MED ORDER — ASPIRIN 81 MG PO TABS: 81.0000 mg | ORAL_TABLET | Freq: Every day | ORAL | Status: DC

## 2012-01-31 MED ORDER — ONDANSETRON HCL 4 MG/2ML IJ SOLN
4.0000 mg | INTRAMUSCULAR | Status: DC | PRN
  Administered 2012-01-31 (×2): 4 mg via INTRAVENOUS
  Filled 2012-01-31 (×2): qty 2

## 2012-01-31 MED ORDER — DEXAMETHASONE SODIUM PHOSPHATE 10 MG/ML IJ SOLN
10.0000 mg | INTRAMUSCULAR | Status: AC
  Administered 2012-01-31: 10 mg via INTRAVENOUS

## 2012-01-31 MED ORDER — MUPIROCIN 2 % EX OINT
TOPICAL_OINTMENT | CUTANEOUS | Status: AC
  Filled 2012-01-31: qty 22

## 2012-01-31 MED ORDER — DEXAMETHASONE SODIUM PHOSPHATE 10 MG/ML IJ SOLN
INTRAMUSCULAR | Status: AC
  Filled 2012-01-31: qty 1

## 2012-01-31 MED ORDER — SODIUM CHLORIDE 0.9 % IV SOLN
INTRAVENOUS | Status: AC
  Filled 2012-01-31: qty 500

## 2012-01-31 MED ORDER — ACETAMINOPHEN 325 MG PO TABS: 650.0000 mg | ORAL_TABLET | ORAL | Status: DC | PRN

## 2012-01-31 MED ORDER — METHOCARBAMOL 500 MG PO TABS
500.0000 mg | ORAL_TABLET | Freq: Four times a day (QID) | ORAL | Status: DC | PRN
  Filled 2012-01-31: qty 1

## 2012-01-31 MED ORDER — MUPIROCIN 2 % EX OINT
TOPICAL_OINTMENT | Freq: Two times a day (BID) | CUTANEOUS | Status: DC
  Administered 2012-01-31 – 2012-02-01 (×2): via NASAL
  Filled 2012-01-31: qty 22

## 2012-01-31 MED ORDER — NITROGLYCERIN 0.4 MG SL SUBL: 0.4000 mg | SUBLINGUAL_TABLET | SUBLINGUAL | Status: DC | PRN

## 2012-01-31 MED ORDER — GLIMEPIRIDE 2 MG PO TABS
2.0000 mg | ORAL_TABLET | Freq: Two times a day (BID) | ORAL | Status: DC
  Administered 2012-02-01: 2 mg via ORAL
  Filled 2012-01-31 (×3): qty 1

## 2012-01-31 MED ORDER — CARVEDILOL 12.5 MG PO TABS
12.5000 mg | ORAL_TABLET | Freq: Two times a day (BID) | ORAL | Status: DC
  Administered 2012-01-31 – 2012-02-01 (×2): 12.5 mg via ORAL
  Filled 2012-01-31 (×4): qty 1

## 2012-01-31 MED ORDER — ACETAMINOPHEN 10 MG/ML IV SOLN
INTRAVENOUS | Status: AC
  Filled 2012-01-31: qty 100

## 2012-01-31 MED ORDER — ZOLPIDEM TARTRATE 5 MG PO TABS: 5.0000 mg | ORAL_TABLET | Freq: Every evening | ORAL | Status: DC | PRN

## 2012-01-31 MED ORDER — ONDANSETRON HCL 4 MG/2ML IJ SOLN: 4.0000 mg | Freq: Once | INTRAMUSCULAR | Status: DC | PRN

## 2012-01-31 MED ORDER — AMLODIPINE BESYLATE 5 MG PO TABS
5.0000 mg | ORAL_TABLET | Freq: Every day | ORAL | Status: DC
  Administered 2012-02-01: 5 mg via ORAL
  Filled 2012-01-31: qty 1

## 2012-01-31 MED ORDER — BUPIVACAINE HCL (PF) 0.25 % IJ SOLN
INTRAMUSCULAR | Status: DC | PRN
  Administered 2012-01-31: 3 mL

## 2012-01-31 MED ORDER — ACETAMINOPHEN 10 MG/ML IV SOLN
INTRAVENOUS | Status: DC | PRN
  Administered 2012-01-31: 1000 mg via INTRAVENOUS

## 2012-01-31 MED ORDER — ASPIRIN EC 81 MG PO TBEC
81.0000 mg | DELAYED_RELEASE_TABLET | Freq: Every day | ORAL | Status: DC
  Administered 2012-02-01: 81 mg via ORAL
  Filled 2012-01-31: qty 1

## 2012-01-31 MED ORDER — ACETAMINOPHEN 650 MG RE SUPP: 650.0000 mg | RECTAL | Status: DC | PRN

## 2012-01-31 MED ORDER — 0.9 % SODIUM CHLORIDE (POUR BTL) OPTIME
TOPICAL | Status: DC | PRN
  Administered 2012-01-31: 1000 mL

## 2012-01-31 MED ORDER — THROMBIN 5000 UNITS EX SOLR
CUTANEOUS | Status: DC | PRN
  Administered 2012-01-31 (×2): 5000 [IU] via TOPICAL

## 2012-01-31 MED ORDER — MORPHINE SULFATE 2 MG/ML IJ SOLN: 1.0000 mg | INTRAMUSCULAR | Status: DC | PRN

## 2012-01-31 MED ORDER — OXYCODONE-ACETAMINOPHEN 5-325 MG PO TABS
1.0000 | ORAL_TABLET | ORAL | Status: DC | PRN
  Administered 2012-02-01: 1 via ORAL
  Filled 2012-01-31: qty 1

## 2012-01-31 MED ORDER — GABAPENTIN 300 MG PO CAPS
300.0000 mg | ORAL_CAPSULE | Freq: Three times a day (TID) | ORAL | Status: DC
  Administered 2012-02-01: 300 mg via ORAL
  Filled 2012-01-31 (×4): qty 1

## 2012-01-31 MED ORDER — FENTANYL CITRATE 0.05 MG/ML IJ SOLN
INTRAMUSCULAR | Status: DC | PRN
  Administered 2012-01-31: 50 ug via INTRAVENOUS
  Administered 2012-01-31: 100 ug via INTRAVENOUS
  Administered 2012-01-31: 50 ug via INTRAVENOUS

## 2012-01-31 MED ORDER — LACTATED RINGERS IV SOLN
INTRAVENOUS | Status: DC | PRN
  Administered 2012-01-31 (×2): via INTRAVENOUS

## 2012-01-31 MED ORDER — ROCURONIUM BROMIDE 100 MG/10ML IV SOLN
INTRAVENOUS | Status: DC | PRN
  Administered 2012-01-31: 50 mg via INTRAVENOUS

## 2012-01-31 MED ORDER — HYDROMORPHONE HCL PF 1 MG/ML IJ SOLN
0.2500 mg | INTRAMUSCULAR | Status: DC | PRN
  Administered 2012-01-31 (×4): 0.5 mg via INTRAVENOUS

## 2012-01-31 MED ORDER — BACITRACIN 50000 UNITS IM SOLR
INTRAMUSCULAR | Status: AC
  Filled 2012-01-31: qty 1

## 2012-01-31 MED ORDER — POTASSIUM CHLORIDE IN NACL 20-0.9 MEQ/L-% IV SOLN
INTRAVENOUS | Status: DC
  Administered 2012-01-31: 18:00:00 via INTRAVENOUS
  Filled 2012-01-31 (×3): qty 1000

## 2012-01-31 MED ORDER — EPHEDRINE SULFATE 50 MG/ML IJ SOLN
INTRAMUSCULAR | Status: DC | PRN
  Administered 2012-01-31: 10 mg via INTRAVENOUS
  Administered 2012-01-31: 5 mg via INTRAVENOUS

## 2012-01-31 SURGICAL SUPPLY — 50 items
APL SKNCLS STERI-STRIP NONHPOA (GAUZE/BANDAGES/DRESSINGS) ×1
BAG DECANTER FOR FLEXI CONT (MISCELLANEOUS) ×2 IMPLANT
BENZOIN TINCTURE PRP APPL 2/3 (GAUZE/BANDAGES/DRESSINGS) ×2 IMPLANT
BUR MATCHSTICK NEURO 3.0 LAGG (BURR) ×2 IMPLANT
CANISTER SUCTION 2500CC (MISCELLANEOUS) ×2 IMPLANT
CLOTH BEACON ORANGE TIMEOUT ST (SAFETY) ×2 IMPLANT
CONT SPEC 4OZ CLIKSEAL STRL BL (MISCELLANEOUS) ×2 IMPLANT
DRAPE LAPAROTOMY 100X72X124 (DRAPES) ×2 IMPLANT
DRAPE MICROSCOPE LEICA (MISCELLANEOUS) ×1 IMPLANT
DRAPE MICROSCOPE ZEISS OPMI (DRAPES) ×1 IMPLANT
DRAPE POUCH INSTRU U-SHP 10X18 (DRAPES) ×2 IMPLANT
DRAPE SURG 17X23 STRL (DRAPES) ×2 IMPLANT
DRESSING TELFA 8X3 (GAUZE/BANDAGES/DRESSINGS) ×2 IMPLANT
DRSG OPSITE 4X5.5 SM (GAUZE/BANDAGES/DRESSINGS) ×2 IMPLANT
DURAPREP 26ML APPLICATOR (WOUND CARE) ×2 IMPLANT
ELECT REM PT RETURN 9FT ADLT (ELECTROSURGICAL) ×2
ELECTRODE REM PT RTRN 9FT ADLT (ELECTROSURGICAL) ×1 IMPLANT
EVACUATOR 1/8 PVC DRAIN (DRAIN) ×1 IMPLANT
GAUZE SPONGE 4X4 16PLY XRAY LF (GAUZE/BANDAGES/DRESSINGS) IMPLANT
GLOVE BIO SURGEON STRL SZ8 (GLOVE) ×2 IMPLANT
GLOVE BIOGEL PI IND STRL 8 (GLOVE) IMPLANT
GLOVE BIOGEL PI INDICATOR 8 (GLOVE) ×1
GLOVE ECLIPSE 7.5 STRL STRAW (GLOVE) ×1 IMPLANT
GLOVE SS BIOGEL STRL SZ 6.5 (GLOVE) IMPLANT
GLOVE SUPERSENSE BIOGEL SZ 6.5 (GLOVE) ×3
GLOVE SURG SS PI 7.0 STRL IVOR (GLOVE) ×2 IMPLANT
GOWN BRE IMP SLV AUR LG STRL (GOWN DISPOSABLE) ×1 IMPLANT
GOWN BRE IMP SLV AUR XL STRL (GOWN DISPOSABLE) ×3 IMPLANT
GOWN STRL REIN 2XL LVL4 (GOWN DISPOSABLE) IMPLANT
HEMOSTAT POWDER KIT SURGIFOAM (HEMOSTASIS) IMPLANT
KIT BASIN OR (CUSTOM PROCEDURE TRAY) ×2 IMPLANT
KIT ROOM TURNOVER OR (KITS) ×2 IMPLANT
NDL HYPO 25X1 1.5 SAFETY (NEEDLE) ×1 IMPLANT
NDL SPNL 20GX3.5 QUINCKE YW (NEEDLE) IMPLANT
NEEDLE HYPO 25X1 1.5 SAFETY (NEEDLE) ×2 IMPLANT
NEEDLE SPNL 20GX3.5 QUINCKE YW (NEEDLE) IMPLANT
NS IRRIG 1000ML POUR BTL (IV SOLUTION) ×2 IMPLANT
PACK LAMINECTOMY NEURO (CUSTOM PROCEDURE TRAY) ×2 IMPLANT
PAD ARMBOARD 7.5X6 YLW CONV (MISCELLANEOUS) ×6 IMPLANT
RUBBERBAND STERILE (MISCELLANEOUS) ×4 IMPLANT
SPONGE SURGIFOAM ABS GEL SZ50 (HEMOSTASIS) ×2 IMPLANT
STRIP CLOSURE SKIN 1/2X4 (GAUZE/BANDAGES/DRESSINGS) ×2 IMPLANT
SUT VIC AB 0 CT1 18XCR BRD8 (SUTURE) ×1 IMPLANT
SUT VIC AB 0 CT1 8-18 (SUTURE) ×2
SUT VIC AB 2-0 CP2 18 (SUTURE) ×2 IMPLANT
SUT VIC AB 3-0 SH 8-18 (SUTURE) ×2 IMPLANT
SYR 20ML ECCENTRIC (SYRINGE) ×2 IMPLANT
TOWEL OR 17X24 6PK STRL BLUE (TOWEL DISPOSABLE) ×2 IMPLANT
TOWEL OR 17X26 10 PK STRL BLUE (TOWEL DISPOSABLE) ×2 IMPLANT
WATER STERILE IRR 1000ML POUR (IV SOLUTION) ×2 IMPLANT

## 2012-01-31 NOTE — H&P (Signed)
Subjective: Patient is a 77 y.o. female admitted for decompressive lam T12-L1 and L1-2. Onset of symptoms was several months ago, gradually worsening since that time.  The pain is rated moderate, and is located at the across the lower back and radiates to legs with N/T. The pain is described as aching and occurs intermittently. The symptoms have been progressive. Symptoms are exacerbated by exercise. MRI or CT showed stenosis T12-L1, L1-2.   Past Medical History  Diagnosis Date  . Arthritis   . Diabetes mellitus   . Headache   . GERD (gastroesophageal reflux disease)   . Ulcer   . Hyperlipidemia   . Migraine   . Colon polyps   . Thyroid disease   . Shingles   . Urine incontinence   . Goiter past remote    treated with RI  . Other primary cardiomyopathies   . Hyperpotassemia   . Inflammatory and toxic neuropathy, unspecified   . Reflex sympathetic dystrophy, unspecified   . Hypertonicity of bladder   . Hypertension   . Shortness of breath     ambulation  . Migraine     hx of  . Vertigo     hx of  . Overactive bladder     Past Surgical History  Procedure Date  . Breast surgery 19599    breast cyst  . Finger surgery [place]  . Tubal ligation 1976  . Kidney donation 1989    left kidney  . Spine surgery 1999  . Ct scan 3/12    outside hosp- lung nodule and gallstones  . Upper gastrointestinal endoscopy   . Colonoscopy 7/12    normal (hx of polyps in past)   . Back surgery   . Eye surgery     cataract removal in left eye; several eye injections  . Cardiac catheterization     Prior to Admission medications   Medication Sig Start Date End Date Taking? Authorizing Provider  amLODipine (NORVASC) 5 MG tablet Take 1 tablet (5 mg total) by mouth daily. 07/19/11  Yes Abner Greenspan, MD  aspirin 81 MG tablet Take 81 mg by mouth daily.     Yes Historical Provider, MD  carvedilol (COREG) 12.5 MG tablet Take 12.5 mg by mouth 2 (two) times daily with a meal.     Yes Historical  Provider, MD  cholecalciferol (VITAMIN D) 1000 UNITS tablet Take 1,000 Units by mouth daily.     Yes Historical Provider, MD  Cyanocobalamin (VITAMIN B 12 PO) 1040mcg  1 tablet by mouth daily/    Yes Historical Provider, MD  fesoterodine (TOVIAZ) 8 MG TB24 Take 8 mg by mouth daily.   Yes Historical Provider, MD  gabapentin (NEURONTIN) 300 MG capsule Take 1 capsule (300 mg total) by mouth 3 (three) times daily. 07/01/11  Yes [place], MD  glimepiride (AMARYL) 2 MG tablet Take 2 mg by mouth 2 (two) times daily.   Yes Historical Provider, MD  HYDROcodone-acetaminophen (NORCO/VICODIN) 5-325 MG per tablet Take 1 tablet by mouth every 6 (six) hours as needed.   Yes Historical Provider, MD  isosorbide mononitrate (IMDUR) 30 MG 24 hr tablet Take 30 mg by mouth daily.   Yes Historical Provider, MD  metFORMIN (GLUCOPHAGE-XR) 500 MG 24 hr tablet 2 tablets by mouth twice a day. 01/14/11  Yes Abner Greenspan, MD  pantoprazole (PROTONIX) 40 MG tablet Take 1 tablet (40 mg total) by mouth daily. 07/19/11  Yes Abner Greenspan, MD  pravastatin (PRAVACHOL)  80 MG tablet Take 1 tablet (80 mg total) by mouth daily. 08/07/11  Yes Spencer Copland, MD  EPINEPHrine (EPIPEN 2-PAK IJ) Inject as directed as directed.     Historical Provider, MD  furosemide (LASIX) 20 MG tablet Take 20 mg by mouth daily as needed.    Historical Provider, MD  nitroGLYCERIN (NITROSTAT) 0.4 MG SL tablet Place 0.4 mg under the tongue every 5 (five) minutes as needed. For chest pain    Historical Provider, MD   Allergies  Allergen Reactions  . Ace Inhibitors     REACTION: tongue swelling  . Codeine     REACTION: nausea  . Lisinopril Swelling    Swelling of tongue    History  Substance Use Topics  . Smoking status: Never Smoker   . Smokeless tobacco: Never Used  . Alcohol Use: No    Family History  Problem Relation Age of Onset  . Arthritis Mother   . Cancer Mother     uterine and mouth  . Hyperlipidemia Mother   . Stroke Mother   .  Hypertension Mother   . Alcohol abuse Father   . Diabetes Father   . Cancer Sister     breast  . Diabetes Sister   . Cancer Brother     lung cancer  . Kidney disease Brother   . Diabetes Brother   . Hyperlipidemia Sister   . Heart disease Sister   . Hypertension Sister   . Diabetes Sister   . Hyperlipidemia Brother   . Kidney failure Brother   . Diabetes Daughter      Review of Systems  Positive ROS: neg  All other systems have been reviewed and were otherwise negative with the exception of those mentioned in the HPI and as above.  Objective: Vital signs in last 24 hours: Temp:  [97.4 F (36.3 C)] 97.4 F (36.3 C) (08/30 1105) Pulse Rate:  [72] 72  (08/30 1105) Resp:  [18] 18  (08/30 1105) BP: (116)/(70) 116/70 mmHg (08/30 1105) SpO2:  [94 %] 94 % (08/30 1105)  General Appearance: Alert, cooperative, no distress, appears stated age Head: Normocephalic, without obvious abnormality, atraumatic Eyes: PERRL, conjunctiva/corneas clear, EOM's intact, fundi benign, both eyes      Ears: Normal TM's and external ear canals, both ears Throat: Lips, mucosa, and tongue normal; teeth and gums normal Neck: Supple, symmetrical, trachea midline, no adenopathy; thyroid: No enlargement/tenderness/nodules; no carotid bruit or JVD Back: Symmetric, no curvature, ROM normal, no CVA tenderness Lungs: Clear to auscultation bilaterally, respirations unlabored Heart: Regular rate and rhythm, S1 and S2 normal, no murmur, rub or gallop Abdomen: Soft, non-tender, bowel sounds active all four quadrants, no masses, no organomegaly Extremities: Extremities normal, atraumatic, no cyanosis or edema Pulses: 2+ and symmetric all extremities Skin: Skin color, texture, turgor normal, no rashes or lesions  NEUROLOGIC:   Mental status: Alert and oriented x4,  no aphasia, good attention span, fund of knowledge, and memory Motor Exam - grossly normal Sensory Exam - grossly normal Reflexes:  1+ Coordination - grossly normal Gait - grossly normal Balance - grossly normal Cranial Nerves: I: smell Not tested  II: visual acuity  OS: nl    OD: nl  II: visual fields Full to confrontation  II: pupils Equal, round, reactive to light  III,VII: ptosis None  III,IV,VI: extraocular muscles  Full ROM  V: mastication Normal  V: facial light touch sensation  Normal  V,VII: corneal reflex  Present  VII: facial muscle function -  upper  Normal  VII: facial muscle function - lower Normal  VIII: hearing Not tested  IX: soft palate elevation  Normal  IX,X: gag reflex Present  XI: trapezius strength  5/5  XI: sternocleidomastoid strength 5/5  XI: neck flexion strength  5/5  XII: tongue strength  Normal    Data Review Lab Results  Component Value Date   WBC 11.3* 01/27/2012   HGB 14.0 01/27/2012   HCT 41.5 01/27/2012   MCV 89.6 01/27/2012   PLT 248 01/27/2012   Lab Results  Component Value Date   NA 140 01/27/2012   K 5.2* 01/27/2012   CL 101 01/27/2012   CO2 28 01/27/2012   BUN 17 01/27/2012   CREATININE 1.23* 01/27/2012   GLUCOSE 242* 01/27/2012   Lab Results  Component Value Date   INR 0.97 01/27/2012    Assessment/Plan: Patient admitted for decompressive laminectomy T12-L1 and L1-L2. Patient has failed conservative therapy.  I explained the condition and procedure to the patient and answered any questions.  Patient wishes to proceed with procedure as planned. Understands risks/ benefits and typical outcomes of procedure.   Amberia Bayless S 01/31/2012 12:12 PM

## 2012-01-31 NOTE — Plan of Care (Signed)
Problem: Consults Goal: Diagnosis - Spinal Surgery Outcome: Completed/Met Date Met:  01/31/12 Lumbar Laminectomy (Complex)

## 2012-01-31 NOTE — Anesthesia Postprocedure Evaluation (Signed)
  Anesthesia Post-op Note  Patient: Morgan Hubbard  Procedure(s) Performed: Procedure(s) (LRB): LUMBAR LAMINECTOMY/DECOMPRESSION MICRODISCECTOMY 2 LEVELS (N/A)  Patient Location: PACU  Anesthesia Type: General  Level of Consciousness: awake, alert  and oriented  Airway and Oxygen Therapy: Patient Spontanous Breathing and Patient connected to nasal cannula oxygen  Post-op Pain: mild  Post-op Assessment: Post-op Vital signs reviewed  Post-op Vital Signs: Reviewed  Complications: No apparent anesthesia complications

## 2012-01-31 NOTE — Transfer of Care (Signed)
Immediate Anesthesia Transfer of Care Note  Patient: Morgan Hubbard  Procedure(s) Performed: Procedure(s) (LRB): LUMBAR LAMINECTOMY/DECOMPRESSION MICRODISCECTOMY 2 LEVELS (N/A)  Patient Location: PACU  Anesthesia Type: General  Level of Consciousness: awake, alert  and oriented  Airway & Oxygen Therapy: Patient Spontanous Breathing and Patient connected to nasal cannula oxygen  Post-op Assessment: Report given to PACU RN and Post -op Vital signs reviewed and stable  Post vital signs: Reviewed  Complications: No apparent anesthesia complications

## 2012-01-31 NOTE — Anesthesia Postprocedure Evaluation (Signed)
  Anesthesia Post-op Note  Patient: Morgan Hubbard  Procedure(s) Performed: Procedure(s) (LRB): LUMBAR LAMINECTOMY/DECOMPRESSION MICRODISCECTOMY 2 LEVELS (N/A)  Patient Location: PACU  Anesthesia Type: General  Level of Consciousness: awake, alert  and oriented  Airway and Oxygen Therapy: Patient Spontanous Breathing and Patient connected to nasal cannula oxygen  Post-op Pain: none  Post-op Assessment: Post-op Vital signs reviewed  Post-op Vital Signs: Reviewed  Complications: No apparent anesthesia complications

## 2012-01-31 NOTE — Preoperative (Signed)
Beta Blockers   Reason not to administer Beta Blockers:Not Applicable 

## 2012-01-31 NOTE — Op Note (Signed)
01/31/2012  2:56 PM  PATIENT:  Morgan Hubbard  77 y.o. female  PRE-OPERATIVE DIAGNOSIS:  Thoracolumbar stenosis  POST-OPERATIVE DIAGNOSIS:  Same  PROCEDURE:  T12-L1 and L1-L2 decompressive laminectomy, medial facetectomy and foraminotomies  SURGEON:  Sherley Bounds, MD  ASSISTANTS: Dr. Sherwood Gambler  ANESTHESIA:   General  EBL: 50 ml  Total I/O In: 1600 [I.V.:1600] Out: -   BLOOD ADMINISTERED:none  DRAINS: Medium Hemovac   SPECIMEN:  No Specimen  INDICATION FOR PROCEDURE: This patient had undergone a previous laminectomy from L2-L5 in the remote past. She developed numbness and tingling in her legs and pain in her back and legs. MRI showed severe spinal stenosis at T12-L1 and L1-L2. There was some recurrent stenosis at the distal levels but certainly these 2 levels seemed to be the most severely stenosed. Recommended at T12-L1 and L1-2 laminectomy.  Patient understood the risks, benefits, and alternatives and potential outcomes and wished to proceed.  PROCEDURE DETAILS: The patient was taken to the operating room and after induction of adequate generalized endotracheal anesthesia, the patient was rolled into the prone position on the Wilson frame and all pressure points were padded. The lumbar region was cleaned and then prepped with DuraPrep and draped in the usual sterile fashion. 5 cc of local anesthesia was injected and then a dorsal midline incision was made and carried down to the thoracolumbar fascia. The fascia was opened and the paraspinous musculature was taken down in a subperiosteal fashion to expose T12-L1 and L1-L2 bilaterally. Intraoperative x-ray confirmed my levels, and then I used a combination of the high-speed drill and the Kerrison punches to perform a hemilaminectomy, medial facetectomy, and foraminotomy at T12-L1 and L1-L2 bilaterally. The underlying yellow ligament was opened and removed in a piecemeal fashion to expose the underlying dura. I undercut the lateral  recesses. There was very severe stenosis at T12-L1 disc space level was a significant deformity of the dura. I was very careful to peel away the yellow ligament and remove it piecemeal fashion and to not compress the dura with my instruments. When I was done with the decompression I palpated with I nerve hook along the lateral recesses. I felt no more compression of the nerve roots and the central canal appeared well decompressed. I irrigated with saline solution containing bacitracin. I placed a medium Hemovac drain through a separate stab incision. Achieved hemostasis with bipolar cautery, lined the dura with Gelfoam, and then closed the fascia with 0 Vicryl. I closed the subcutaneous tissues with 2-0 Vicryl and the subcuticular tissues with 3-0 Vicryl. The skin was then closed with benzoin and Steri-Strips. The drapes were removed, a sterile dressing was applied. The patient was awakened from general anesthesia and transferred to the recovery room in stable condition. At the end of the procedure all sponge, needle and instrument counts were correct.  PLAN OF CARE: Admit for overnight observation  PATIENT DISPOSITION:  PACU - hemodynamically stable.   Delay start of Pharmacological VTE agent (>24hrs) due to surgical blood loss or risk of bleeding:  yes

## 2012-01-31 NOTE — Transfer of Care (Signed)
Immediate Anesthesia Transfer of Care Note  Patient: Morgan Hubbard  Procedure(s) Performed: Procedure(s) (LRB): LUMBAR LAMINECTOMY/DECOMPRESSION MICRODISCECTOMY 2 LEVELS (N/A)  Patient Location: PACU  Anesthesia Type: General  Level of Consciousness: awake and alert   Airway & Oxygen Therapy: Patient Spontanous Breathing and Patient connected to nasal cannula oxygen  Post-op Assessment: Report given to PACU RN and Post -op Vital signs reviewed and stable  Post vital signs: Reviewed  Complications: No apparent anesthesia complications

## 2012-01-31 NOTE — Anesthesia Preprocedure Evaluation (Addendum)
Anesthesia Evaluation  Patient identified by MRN, date of birth, ID band Patient awake    Reviewed: Allergy & Precautions, H&P , NPO status , Patient's Chart, lab work & pertinent test results, reviewed documented beta blocker date and time   History of Anesthesia Complications Negative for: history of anesthetic complications  Airway Mallampati: I  Neck ROM: Full  Mouth opening: Limited Mouth Opening  Dental  (+) Teeth Intact and Dental Advisory Given   Pulmonary  breath sounds clear to auscultation        Cardiovascular hypertension, Pt. on medications and Pt. on home beta blockers Rhythm:Regular Rate:Normal     Neuro/Psych    GI/Hepatic GERD-  Medicated and Controlled,Fatty liver.   Endo/Other  Well Controlled, Type 2, Oral Hypoglycemic Agents  Renal/GU Renal diseasePt has only one kidney as she donated one.     Musculoskeletal   Abdominal   Peds  Hematology   Anesthesia Other Findings   Reproductive/Obstetrics                          Anesthesia Physical Anesthesia Plan  ASA: III  Anesthesia Plan: General   Post-op Pain Management:    Induction: Intravenous  Airway Management Planned:   Additional Equipment:   Intra-op Plan:   Post-operative Plan: Extubation in OR  Informed Consent: I have reviewed the patients History and Physical, chart, labs and discussed the procedure including the risks, benefits and alternatives for the proposed anesthesia with the patient or authorized representative who has indicated his/her understanding and acceptance.   Dental advisory given  Plan Discussed with: CRNA, Anesthesiologist and Surgeon  Anesthesia Plan Comments:         Anesthesia Quick Evaluation

## 2012-02-01 LAB — GLUCOSE, CAPILLARY: Glucose-Capillary: 296 mg/dL — ABNORMAL HIGH (ref 70–99)

## 2012-02-01 NOTE — Progress Notes (Signed)
Subjective: Patient reports doing well.  Leg pain much improved.  Objective: Vital signs in last 24 hours: Temp:  [97 F (36.1 C)-98.5 F (36.9 C)] 98.4 F (36.9 C) (08/31 0821) Pulse Rate:  [50-82] 72  (08/31 0821) Resp:  [13-27] 18  (08/31 0821) BP: (92-117)/(41-70) 105/54 mmHg (08/31 0821) SpO2:  [84 %-99 %] 96 % (08/31 0821)  Intake/Output from previous day: 08/30 0701 - 08/31 0700 In: 1800 [I.V.:1800] Out: 140 [Drains:140] Intake/Output this shift:    Physical Exam: Dressing CDI.  Good lower extremity strength.  Lab Results: No results found for this basename: WBC:2,HGB:2,HCT:2,PLT:2 in the last 72 hours BMET No results found for this basename: NA:2,K:2,CL:2,CO2:2,GLUCOSE:2,BUN:2,CREATININE:2,CALCIUM:2 in the last 72 hours  Studies/Results: Dg Lumbar Spine 2-3 Views  01/31/2012  *RADIOLOGY REPORT*  Clinical Data: Intraoperative localization.  T12-L1, L1-L2 laminectomy.  LUMBAR SPINE - 2-3 VIEW  Comparison: MRI 11/25/2011.  Findings: Lateral intraoperative localization views are submitted for interpretation.  The second image demonstrates soft tissue retractors dorsal to L1.  The tip of the superior probe projects over the T12 pedicles.  The inferior probe tip projects over the L2 pedicles.  The numbering is correlated with MRI 11/25/2011.  IMPRESSION: Intraoperative localization with probes over the T12 and L2 pedicles.   Original Report Authenticated By: Dereck Ligas, M.D.     Assessment/Plan: Doing well. D/C home.    LOS: 1 day    Peggyann Shoals, MD 02/01/2012, 9:05 AM

## 2012-02-01 NOTE — Discharge Summary (Signed)
Physician Discharge Summary  Patient ID: Morgan Hubbard MRN: [id number] DOB/AGE: 08/06/1934 77 y.o.  Admit date: 01/31/2012 Discharge date: 02/01/2012  Admission Diagnoses:  Discharge Diagnoses:  Active Problems:  * No active hospital problems. *    Discharged Condition: good  Hospital Course: Uncomplicated recovery following 2 level lumbar laminectomy for spinal stenosis  Consults: None  Significant Diagnostic Studies: None  Treatments: surgery: 2 level lumbar laminectomy for spinal stenosis  Discharge Exam: Blood pressure 105/54, pulse 72, temperature 98.4 F (36.9 C), temperature source Oral, resp. rate 18, last menstrual period 06/03/1992, SpO2 96.00%. Neurologic: Alert and oriented X 3, normal strength and tone. Normal symmetric reflexes. Normal coordination and gait Wound:CDI  Disposition: Doing well.  D/C home.   Medication List  As of 02/01/2012  9:10 AM   TAKE these medications         AMARYL 2 MG tablet   Generic drug: glimepiride   Take 2 mg by mouth 2 (two) times daily.      amLODipine 5 MG tablet   Commonly known as: NORVASC   Take 1 tablet (5 mg total) by mouth daily.      aspirin 81 MG tablet   Take 81 mg by mouth daily.      carvedilol 12.5 MG tablet   Commonly known as: COREG   Take 12.5 mg by mouth 2 (two) times daily with a meal.      cholecalciferol 1000 UNITS tablet   Commonly known as: VITAMIN D   Take 1,000 Units by mouth daily.      EPIPEN 2-PAK IJ   Inject as directed as directed.      furosemide 20 MG tablet   Commonly known as: LASIX   Take 20 mg by mouth daily as needed.      gabapentin 300 MG capsule   Commonly known as: NEURONTIN   Take 1 capsule (300 mg total) by mouth 3 (three) times daily.      HYDROcodone-acetaminophen 5-325 MG per tablet   Commonly known as: NORCO/VICODIN   Take 1 tablet by mouth every 6 (six) hours as needed.      isosorbide mononitrate 30 MG 24 hr tablet   Commonly known as: IMDUR   Take 30 mg by  mouth daily.      metFORMIN 500 MG 24 hr tablet   Commonly known as: GLUCOPHAGE-XR   2 tablets by mouth twice a day.      nitroGLYCERIN 0.4 MG SL tablet   Commonly known as: NITROSTAT   Place 0.4 mg under the tongue every 5 (five) minutes as needed. For chest pain      pantoprazole 40 MG tablet   Commonly known as: PROTONIX   Take 1 tablet (40 mg total) by mouth daily.      pravastatin 80 MG tablet   Commonly known as: PRAVACHOL   Take 1 tablet (80 mg total) by mouth daily.      TOVIAZ 8 MG Tb24   Generic drug: fesoterodine   Take 8 mg by mouth daily.      VITAMIN B 12 PO   1065mcg  1 tablet by mouth daily/             Signed: Peggyann Shoals, MD 02/01/2012, 9:10 AM

## 2012-02-04 ENCOUNTER — Encounter (HOSPITAL_COMMUNITY): Payer: Self-pay | Admitting: Neurological Surgery

## 2012-02-07 ENCOUNTER — Other Ambulatory Visit: Payer: Self-pay | Admitting: Family Medicine

## 2012-02-07 NOTE — Telephone Encounter (Signed)
Metformin 500 mg 24 hr #320 2R. Called in to [place], [place]

## 2012-02-18 ENCOUNTER — Telehealth: Payer: Self-pay | Admitting: Family Medicine

## 2012-02-18 NOTE — Telephone Encounter (Signed)
Caller: [name]/Patient; Patient Name: Morgan Hubbard; PCP: [name] [facility]); Best Callback Phone Number: [phone].  Onset 02/17/12 with "whelps" on stomach, under arms, under breast and on leg.  Pt reports it is itchy. Pt has tried Human resources officer but it isnt helping.  Triaged patient per Hives Protocol.  See Provider within 24 hours Disposition for 'Episode of hives not improving within 8 hours of home care and not previously evaluated'.  Home care advice given and appt scheduled for 02/19/12 at 900 with Dr [name]

## 2012-02-18 NOTE — Telephone Encounter (Signed)
I will see her then  

## 2012-02-19 ENCOUNTER — Encounter: Payer: Self-pay | Admitting: Family Medicine

## 2012-02-19 ENCOUNTER — Ambulatory Visit (INDEPENDENT_AMBULATORY_CARE_PROVIDER_SITE_OTHER): Payer: Medicare Other | Admitting: Family Medicine

## 2012-02-19 VITALS — BP 144/76 | HR 80 | Temp 97.7°F | Ht 62.5 in | Wt 155.0 lb

## 2012-02-19 DIAGNOSIS — L509 Urticaria, unspecified: Secondary | ICD-10-CM

## 2012-02-19 NOTE — Patient Instructions (Addendum)
You have hives  Likely an allergic reaction - unsure what to  Anesthesia or percocet are possible  Try zyrtec 10 mg daily over the counter  benadryl is also an option - is very sedating Update if not starting to improve in a week or if worsening  (go to ER if mouth or throat swelling) Hives Hives (urticaria) are itchy, red, swollen patches on the skin. They may change size, shape, and location quickly and repeatedly. Hives that occur deeper in the skin can cause swelling of the hands, feet, and face. Hives may be an allergic reaction to something you or your child ate, touched, or put on the skin. Hives can also be a reaction to cold, heat, viral infections, medication, insect bites, or emotional stress. Often the cause is hard to find. Hives can come and go for several days to several weeks. Hives are not contagious. HOME CARE INSTRUCTIONS    If the cause of the hives is known, avoid exposure to that source.   To relieve itching and rash:   Apply cold compresses to the skin or take cool water baths. Do not take or give your child hot baths or showers because the warmth will make the itching worse.   The best medicine for hives is an antihistamine. An antihistamine will not cure hives, but it will reduce their severity. You can use an antihistamine available over the counter. This medicine may make your child sleepy. Teenagers should not drive while using this medicine.   Take or give an antihistamine every 6 hours until the hives are completely gone for 24 hours or as directed.   Your child may have other medications prescribed for itching. Give these as directed by your child's caregiver.   You or your child should wear loose fitting clothing, including undergarments. Skin irritations may make hives worse.   Follow-up as directed by your caregiver.  SEEK MEDICAL CARE IF:    You or your child still have considerable itching after taking the medication (prescribed or purchased over the  counter).   Joint swelling or pain occurs.  SEEK IMMEDIATE MEDICAL CARE IF:    You have a fever.   Swollen lips or tongue are noticed.   There is difficulty with breathing, swallowing, or tightness in the throat or chest.   Abdominal pain develops.   Your child starts acting very sick.  These may be the first signs of a life-threatening allergic reaction. THIS IS AN EMERGENCY. Call 911 for medical help. MAKE SURE YOU:    Understand these instructions.   Will watch your condition.   Will get help right away if you are not doing well or get worse.  Document Released: 05/20/2005 Document Revised: 05/09/2011 Document Reviewed: 01/08/2008 [facility] Patient Information 2012 [place].

## 2012-02-19 NOTE — Progress Notes (Signed)
Subjective:    Patient ID: Morgan Hubbard, female    DOB: [birth year], 77 y.o.   MRN: [id number]  HPI Is here for hives  They started 3 days ago  Has been inside for 3 weeks- had back surgery   Took percocet the first week - lips swelled and hands itched so stopped it  Took tramadol before the hives (one dose)   Before back surgery - tramadol did not bother her   Hives are all over/ whelps / is itchy    No mouth or lip swelling /tongue swelling  No cough or wheeze No runny nose  Took an allegra yesterday Used some calamine yesterday  Patient Active Problem List  Diagnosis  . DM  . HYPERCHOLESTEROLEMIA  . HYPERKALEMIA  . REFLEX SYMPATHETIC DYSTROPHY  . POLYNEUROPATHY  . HYPERTENSION  . CARDIOMYOPATHY  . OVERACTIVE BLADDER  . Fatty liver  . Left sided abdominal pain  . Tongue swelling  . Goiter  . Hematuria  . Left shoulder pain  . Renal insufficiency  . Spinal stenosis of lumbar region  . Mixed incontinence urge and stress   Past Medical History  Diagnosis Date  . Arthritis   . Diabetes mellitus   . Headache   . GERD (gastroesophageal reflux disease)   . Ulcer   . Hyperlipidemia   . Migraine   . Colon polyps   . Thyroid disease   . Shingles   . Urine incontinence   . Goiter past remote    treated with RI  . Other primary cardiomyopathies   . Hyperpotassemia   . Inflammatory and toxic neuropathy, unspecified   . Reflex sympathetic dystrophy, unspecified   . Hypertonicity of bladder   . Hypertension   . Shortness of breath     ambulation  . Migraine     hx of  . Vertigo     hx of  . Overactive bladder    Past Surgical History  Procedure Date  . Breast surgery 19599    breast cyst  . Finger surgery [place]  . Tubal ligation 1976  . Kidney donation 1989    left kidney  . Spine surgery 1999  . Ct scan 3/12    outside hosp- lung nodule and gallstones  . Upper gastrointestinal endoscopy   . Colonoscopy 7/12    normal (hx of polyps in past)    . Back surgery   . Eye surgery     cataract removal in left eye; several eye injections  . Cardiac catheterization   . Lumbar laminectomy/decompression microdiscectomy 01/31/2012    Procedure: LUMBAR LAMINECTOMY/DECOMPRESSION MICRODISCECTOMY 2 LEVELS;  Surgeon: Eustace Moore, MD;  Location: [place] [facility];  Service: Neurosurgery;  Laterality: N/A;  Thoracic twelve-lumbar one, lumbar one-two laminectomy    History  Substance Use Topics  . Smoking status: Never Smoker   . Smokeless tobacco: Never Used  . Alcohol Use: Not on file   Family History  Problem Relation Age of Onset  . Arthritis Mother   . Cancer Mother     uterine and mouth  . Hyperlipidemia Mother   . Stroke Mother   . Hypertension Mother   . Alcohol abuse Father   . Diabetes Father   . Cancer Sister     breast  . Diabetes Sister   . Cancer Brother     lung cancer  . Kidney disease Brother   . Diabetes Brother   . Hyperlipidemia Sister   . Heart disease Sister   .  Hypertension Sister   . Diabetes Sister   . Hyperlipidemia Brother   . Kidney failure Brother   . Diabetes Daughter    Allergies  Allergen Reactions  . Ace Inhibitors     REACTION: tongue swelling  . Codeine     REACTION: nausea  . Lisinopril Swelling    Swelling of tongue  . Percocet (Oxycodone-Acetaminophen)     Lips swell   Current Outpatient Prescriptions on File Prior to Visit  Medication Sig Dispense Refill  . amLODipine (NORVASC) 5 MG tablet Take 1 tablet (5 mg total) by mouth daily.  90 tablet  3  . aspirin 81 MG tablet Take 81 mg by mouth daily.        . carvedilol (COREG) 12.5 MG tablet Take 12.5 mg by mouth 2 (two) times daily with a meal.        . cholecalciferol (VITAMIN D) 1000 UNITS tablet Take 1,000 Units by mouth daily.        . Cyanocobalamin (VITAMIN B 12 PO) 1071mcg  1 tablet by mouth daily/       . EPINEPHrine (EPIPEN 2-PAK IJ) Inject as directed as directed.       . fesoterodine (TOVIAZ) 8 MG TB24 Take 8 mg by mouth  daily.      . furosemide (LASIX) 20 MG tablet Take 20 mg by mouth daily as needed.      . gabapentin (NEURONTIN) 300 MG capsule Take 1 capsule (300 mg total) by mouth 3 (three) times daily.  270 capsule  3  . glimepiride (AMARYL) 2 MG tablet Take 2 mg by mouth 2 (two) times daily.      Marland Kitchen HYDROcodone-acetaminophen (NORCO/VICODIN) 5-325 MG per tablet Take 1 tablet by mouth every 6 (six) hours as needed.      . isosorbide mononitrate (IMDUR) 30 MG 24 hr tablet Take 30 mg by mouth daily.      . metFORMIN (GLUCOPHAGE-XR) 500 MG 24 hr tablet TAKE TWO TABLETS BY MOUTH TWICE DAILY  320 tablet  2  . nitroGLYCERIN (NITROSTAT) 0.4 MG SL tablet Place 0.4 mg under the tongue every 5 (five) minutes as needed. For chest pain      . pantoprazole (PROTONIX) 40 MG tablet Take 1 tablet (40 mg total) by mouth daily.  90 tablet  3  . pravastatin (PRAVACHOL) 80 MG tablet Take 1 tablet (80 mg total) by mouth daily.  90 tablet  3        Review of Systems Review of Systems  Constitutional: Negative for fever, appetite change, fatigue and unexpected weight change.  Eyes: Negative for pain and visual disturbance.  ENT neg for mouth or tongue swelling  Respiratory: Negative for cough and shortness of breath.   Cardiovascular: Negative for cp or palpitations    Gastrointestinal: Negative for nausea, diarrhea and constipation.  Genitourinary: Negative for urgency and frequency.  Skin: Negative for pallor and pos for rash/ hives with itching , neg for insect bites  Neurological: Negative for weakness, light-headedness, numbness and headaches.  Hematological: Negative for adenopathy. Does not bruise/bleed easily.  Psychiatric/Behavioral: Negative for dysphoric mood. The patient is not nervous/anxious.         Objective:   Physical Exam  Constitutional: She appears well-developed and well-nourished. No distress.  HENT:  Head: Normocephalic and atraumatic.  Mouth/Throat: Oropharynx is clear and moist. No  oropharyngeal exudate.  Eyes: Conjunctivae normal and EOM are normal. Pupils are equal, round, and reactive to light. Right eye exhibits no discharge.  Left eye exhibits no discharge.  Neck: Normal range of motion. Neck supple. No tracheal deviation present.  Cardiovascular: Normal rate and regular rhythm.   Pulmonary/Chest: Effort normal and breath sounds normal.  Abdominal: Soft. Bowel sounds are normal.  Musculoskeletal: She exhibits no edema.  Lymphadenopathy:    She has no cervical adenopathy.  Neurological: She is alert.  Skin: Skin is warm and dry. Rash noted. There is erythema. No pallor.       Diffuse hives , some confluent (red whelps) over trunk and proximal extremeties and neck No facial swelling   Psychiatric: She has a normal mood and affect.          Assessment & Plan:

## 2012-02-19 NOTE — Assessment & Plan Note (Signed)
Without angioedema Suspect poss delayed rxn to anesthesia or percocet -noted that  Will try antihistamines-see AVS If not imp - prednisone is an option- but since she is healing from surg- will try to avoid

## 2012-03-03 ENCOUNTER — Telehealth: Payer: Self-pay | Admitting: Family Medicine

## 2012-03-03 DIAGNOSIS — L509 Urticaria, unspecified: Secondary | ICD-10-CM

## 2012-03-03 MED ORDER — PREDNISONE 10 MG PO TABS: ORAL_TABLET | ORAL | Status: DC

## 2012-03-03 NOTE — Telephone Encounter (Signed)
I already did the allergy ref- Rosaria Ferries will be calling so she can make appt if she wants to before she leaves -since there may be a wait anyway

## 2012-03-03 NOTE — Telephone Encounter (Signed)
Pt stated that she is healed from surgery so advise her of trying the prednisone also advise it might make her hyper and hungry, pt stated that she still had some 10mg  prednisone at home so she didn't need me call in the Rx, pt said when she gets back from trip she does want a referral to an allergist and will call us once she is back

## 2012-03-03 NOTE — Telephone Encounter (Signed)
If she is totally healed from her back surgery- we can try a tapering course of prednisone - then get her to an allergist once she returns from her trip to help figure this out  Px written for call in   Warn her the med may make her hyper and hungry  Will also ref to allergist - for when she gets back  Continue the antihistamine also

## 2012-03-03 NOTE — Telephone Encounter (Signed)
Caller: [name]/Patient; Patient Name: Morgan Hubbard; PCP: [name] [facility]); Best Callback Phone Number: [phone]  onset around 12-18-11 of hives and was seen for same and put on antihistaminehe said she no longer has the big areas on her scalp but she gets them popping up different areas on her body.  They do itch.  All emergent sxs per Hives protocols r/o     She is leaving Friday to go to [place] and is wondering if there could be anything else done via phone instead of having to make another appt.  Please advise.

## 2012-03-05 ENCOUNTER — Telehealth: Payer: Self-pay | Admitting: Family Medicine

## 2012-03-05 NOTE — Telephone Encounter (Signed)
Called patient to set up Allergy consult and she said that the hives are gone from taking the prednisone and she has seen Dr Annamaria Boots before for tongue swelling. She doesn't want referral now and if she changes her mind then she will call us back or will call Dr Bertrum Sol office to set up her own appt.

## 2012-03-27 ENCOUNTER — Ambulatory Visit (INDEPENDENT_AMBULATORY_CARE_PROVIDER_SITE_OTHER): Payer: Medicare Other | Admitting: Family Medicine

## 2012-03-27 ENCOUNTER — Encounter: Payer: Self-pay | Admitting: Family Medicine

## 2012-03-27 VITALS — BP 146/68 | HR 82 | Temp 97.9°F | Ht 62.5 in | Wt 157.5 lb

## 2012-03-27 DIAGNOSIS — M25551 Pain in right hip: Secondary | ICD-10-CM

## 2012-03-27 DIAGNOSIS — M25559 Pain in unspecified hip: Secondary | ICD-10-CM

## 2012-03-27 MED ORDER — PREDNISONE 10 MG PO TABS: ORAL_TABLET | ORAL | Status: DC

## 2012-03-27 NOTE — Patient Instructions (Addendum)
Put ice on your painful area 10 min on and 10 min off through the weekend  Also take the prednisone taper as directed  If not starting to improve early next week - please call and we will set you up to see Dr Lorelei Pont (our sports medicine doctor)

## 2012-03-27 NOTE — Progress Notes (Signed)
Subjective:    Patient ID: Morgan Hubbard, female    DOB: 05/03/1935, 77 y.o.   MRN: [id number]  HPI  Had her back surgery the end of aug Did great  Traveled and did great too   Then developed bad right hip pain on Wednesday  Has never had right hip problems on that side before -has had L hip bursitis before   Hurts on the outside  Happened out of blue  Feels like it is deep  Feels like it could give way - due to pain  Sometimes pain shoots down her leg  No trauma/ falls or other problems  No new exercise program- but did a lot of walking in [place]  Is taking nucynta - (became allergic to percocet and tramadol did not do any good)   Her back is no longer bothering her for the most part   Lab Results  Component Value Date   CREATININE 1.23* 01/27/2012    She has prednisone and takes it off and on  Took one today 10 mg  Not taking any vicodin Needs to stay away from nsaids due to her renal insuf    Patient Active Problem List  Diagnosis  . DM  . HYPERCHOLESTEROLEMIA  . HYPERKALEMIA  . REFLEX SYMPATHETIC DYSTROPHY  . POLYNEUROPATHY  . HYPERTENSION  . CARDIOMYOPATHY  . OVERACTIVE BLADDER  . Fatty liver  . Left sided abdominal pain  . Tongue swelling  . Goiter  . Hematuria  . Left shoulder pain  . Renal insufficiency  . Spinal stenosis of lumbar region  . Mixed incontinence urge and stress  . Urticaria   Past Medical History  Diagnosis Date  . Arthritis   . Diabetes mellitus   . Headache   . GERD (gastroesophageal reflux disease)   . Ulcer   . Hyperlipidemia   . Migraine   . Colon polyps   . Thyroid disease   . Shingles   . Urine incontinence   . Goiter past remote    treated with RI  . Other primary cardiomyopathies   . Hyperpotassemia   . Inflammatory and toxic neuropathy, unspecified   . Reflex sympathetic dystrophy, unspecified   . Hypertonicity of bladder   . Hypertension   . Shortness of breath     ambulation  . Migraine     hx of  .  Vertigo     hx of  . Overactive bladder    Past Surgical History  Procedure Date  . Breast surgery 19599    breast cyst  . Finger surgery [place]  . Tubal ligation 1976  . Kidney donation 1989    left kidney  . Spine surgery 1999  . Ct scan 3/12    outside hosp- lung nodule and gallstones  . Upper gastrointestinal endoscopy   . Colonoscopy 7/12    normal (hx of polyps in past)   . Back surgery   . Eye surgery     cataract removal in left eye; several eye injections  . Cardiac catheterization   . Lumbar laminectomy/decompression microdiscectomy 01/31/2012    Procedure: LUMBAR LAMINECTOMY/DECOMPRESSION MICRODISCECTOMY 2 LEVELS;  Surgeon: Eustace Moore, MD;  Location: [facility];  Service: Neurosurgery;  Laterality: N/A;  Thoracic twelve-lumbar one, lumbar one-two laminectomy    History  Substance Use Topics  . Smoking status: Never Smoker   . Smokeless tobacco: Never Used  . Alcohol Use: No   Family History  Problem Relation Age of Onset  .  Arthritis Mother   . Cancer Mother     uterine and mouth  . Hyperlipidemia Mother   . Stroke Mother   . Hypertension Mother   . Alcohol abuse Father   . Diabetes Father   . Cancer Sister     breast  . Diabetes Sister   . Cancer Brother     lung cancer  . Kidney disease Brother   . Diabetes Brother   . Hyperlipidemia Sister   . Heart disease Sister   . Hypertension Sister   . Diabetes Sister   . Hyperlipidemia Brother   . Kidney failure Brother   . Diabetes Daughter    Allergies  Allergen Reactions  . Ace Inhibitors     REACTION: tongue swelling  . Codeine     REACTION: nausea  . Lisinopril Swelling    Swelling of tongue  . Percocet (Oxycodone-Acetaminophen)     Lips swell   Current Outpatient Prescriptions on File Prior to Visit  Medication Sig Dispense Refill  . amLODipine (NORVASC) 5 MG tablet Take 1 tablet (5 mg total) by mouth daily.  90 tablet  3  . aspirin 81 MG tablet Take 81 mg by mouth daily.         . carvedilol (COREG) 12.5 MG tablet Take 12.5 mg by mouth 2 (two) times daily with a meal.        . Cyanocobalamin (VITAMIN B 12 PO) 1063mcg  1 tablet by mouth daily/       . EPINEPHrine (EPIPEN 2-PAK IJ) Inject as directed as directed.       . fesoterodine (TOVIAZ) 8 MG TB24 Take 8 mg by mouth daily.      . furosemide (LASIX) 20 MG tablet Take 20 mg by mouth daily as needed.      . gabapentin (NEURONTIN) 300 MG capsule Take 1 capsule (300 mg total) by mouth 3 (three) times daily.  270 capsule  3  . glimepiride (AMARYL) 2 MG tablet Take 2 mg by mouth 2 (two) times daily.      Marland Kitchen HYDROcodone-acetaminophen (NORCO/VICODIN) 5-325 MG per tablet Take 1 tablet by mouth every 6 (six) hours as needed.      . isosorbide mononitrate (IMDUR) 30 MG 24 hr tablet Take 30 mg by mouth daily.      . metFORMIN (GLUCOPHAGE-XR) 500 MG 24 hr tablet TAKE TWO TABLETS BY MOUTH TWICE DAILY  320 tablet  2  . nitroGLYCERIN (NITROSTAT) 0.4 MG SL tablet Place 0.4 mg under the tongue every 5 (five) minutes as needed. For chest pain      . pantoprazole (PROTONIX) 40 MG tablet Take 1 tablet (40 mg total) by mouth daily.  90 tablet  3  . pravastatin (PRAVACHOL) 80 MG tablet Take 1 tablet (80 mg total) by mouth daily.  90 tablet  3     Review of Systems Review of Systems  Constitutional: Negative for fever, appetite change, fatigue and unexpected weight change.  Eyes: Negative for pain and visual disturbance.  Respiratory: Negative for cough and shortness of breath.   Cardiovascular: Negative for cp or palpitations    Gastrointestinal: Negative for nausea, diarrhea and constipation.  Genitourinary: Negative for urgency and frequency.  Skin: Negative for pallor or rash   MSK pos for R hip pain, neg for joint swelling or redness Neurological: Negative for weakness, light-headedness, numbness and headaches.  Hematological: Negative for adenopathy. Does not bruise/bleed easily.  Psychiatric/Behavioral: Negative for dysphoric  mood. The patient is not nervous/anxious.  Objective:   Physical Exam  Constitutional: She appears well-developed and well-nourished. No distress.  HENT:  Head: Normocephalic and atraumatic.  Mouth/Throat: Oropharynx is clear and moist.  Eyes: Conjunctivae normal and EOM are normal. Pupils are equal, round, and reactive to light. Right eye exhibits no discharge. Left eye exhibits no discharge. No scleral icterus.  Neck: Normal range of motion. Neck supple. No JVD present. No thyromegaly present.  Cardiovascular: Normal rate and regular rhythm.   Pulmonary/Chest: Effort normal and breath sounds normal.  Abdominal: Soft. Bowel sounds are normal. She exhibits no distension and no mass. There is no tenderness.       No suprapubic tenderness or fullness    Musculoskeletal: She exhibits tenderness. She exhibits no edema.       Right hip: She exhibits decreased range of motion. She exhibits no swelling and no crepitus.       Tender over R greater trochanter with pain on internal rotation of hip  Gait is labored due to pain  Nl slr  No LS tenderness   Lymphadenopathy:    She has no cervical adenopathy.  Neurological: She is alert.  Skin: Skin is warm and dry. No rash noted. No erythema.  Psychiatric: She has a normal mood and affect.          Assessment & Plan:

## 2012-03-29 NOTE — Assessment & Plan Note (Signed)
Tenderness over R greater trochanter consistent with trochanteric bursitis  Pt is unable to take nsaid due to renal insuff- will tx with pred taper and ice Will call for sport med appt mon if no imp - may need injection / imaging

## 2012-03-30 ENCOUNTER — Ambulatory Visit (INDEPENDENT_AMBULATORY_CARE_PROVIDER_SITE_OTHER): Payer: Medicare Other | Admitting: Family Medicine

## 2012-03-30 ENCOUNTER — Encounter: Payer: Self-pay | Admitting: Family Medicine

## 2012-03-30 VITALS — BP 128/68 | HR 84 | Temp 98.2°F | Wt 155.0 lb

## 2012-03-30 DIAGNOSIS — M7061 Trochanteric bursitis, right hip: Secondary | ICD-10-CM

## 2012-03-30 DIAGNOSIS — M76899 Other specified enthesopathies of unspecified lower limb, excluding foot: Secondary | ICD-10-CM

## 2012-03-30 NOTE — Progress Notes (Signed)
Therapist, music at [facility] [place] [address] Phone: [phone] Fax: [phone]  Date:  03/30/2012   Name:  Morgan Hubbard   DOB:  Nov 12, 1934   MRN:  [id number] Gender: female Age: 77 y.o.  PCP:  [name], MD  Evaluating MD: [name], MD   Chief Complaint: Hip Pain   History of Present Illness:  Morgan Hubbard is a 77 y.o. pleasant patient who presents with the following:  Very pleasant patient actually had a 2 level decompression laminectomy almost 2 months ago, and then subsequently went to [place] about 5 weeks after her surgery. She did fine in [place], and used her walker. After coming on, over the last week she is developed right-sided lateral hip pain. No pain in the buttocks. The back pain. No radiculopathy, numbness, or tingling.  Flew in [place] - 5 weeks after back surgery, 5 weeks after back surgery from Dr. Ronnald Ramp.   Patient Active Problem List  Diagnosis  . DM  . HYPERCHOLESTEROLEMIA  . HYPERKALEMIA  . REFLEX SYMPATHETIC DYSTROPHY  . POLYNEUROPATHY  . HYPERTENSION  . CARDIOMYOPATHY  . OVERACTIVE BLADDER  . Fatty liver  . Left sided abdominal pain  . Tongue swelling  . Goiter  . Hematuria  . Left shoulder pain  . Renal insufficiency  . Spinal stenosis of lumbar region  . Mixed incontinence urge and stress  . Urticaria  . Right hip pain    Past Medical History  Diagnosis Date  . Arthritis   . Diabetes mellitus   . Headache   . GERD (gastroesophageal reflux disease)   . Ulcer   . Hyperlipidemia   . Migraine   . Colon polyps   . Thyroid disease   . Shingles   . Urine incontinence   . Goiter past remote    treated with RI  . Other primary cardiomyopathies   . Hyperpotassemia   . Inflammatory and toxic neuropathy, unspecified   . Reflex sympathetic dystrophy, unspecified   . Hypertonicity of bladder   . Hypertension   . Shortness of breath     ambulation  . Migraine     hx of  . Vertigo     hx of    . Overactive bladder     Past Surgical History  Procedure Date  . Breast surgery 19599    breast cyst  . Finger surgery [place]  . Tubal ligation 1976  . Kidney donation 1989    left kidney  . Spine surgery 1999  . Ct scan 3/12    outside hosp- lung nodule and gallstones  . Upper gastrointestinal endoscopy   . Colonoscopy 7/12    normal (hx of polyps in past)   . Back surgery   . Eye surgery     cataract removal in left eye; several eye injections  . Cardiac catheterization   . Lumbar laminectomy/decompression microdiscectomy 01/31/2012    Procedure: LUMBAR LAMINECTOMY/DECOMPRESSION MICRODISCECTOMY 2 LEVELS;  Surgeon: Eustace Moore, MD;  Location: [facility];  Service: Neurosurgery;  Laterality: N/A;  Thoracic twelve-lumbar one, lumbar one-two laminectomy     History  Substance Use Topics  . Smoking status: Never Smoker   . Smokeless tobacco: Never Used  . Alcohol Use: No    Family History  Problem Relation Age of Onset  . Arthritis Mother   . Cancer Mother     uterine and mouth  . Hyperlipidemia Mother   . Stroke Mother   .  Hypertension Mother   . Alcohol abuse Father   . Diabetes Father   . Cancer Sister     breast  . Diabetes Sister   . Cancer Brother     lung cancer  . Kidney disease Brother   . Diabetes Brother   . Hyperlipidemia Sister   . Heart disease Sister   . Hypertension Sister   . Diabetes Sister   . Hyperlipidemia Brother   . Kidney failure Brother   . Diabetes Daughter     Allergies  Allergen Reactions  . Ace Inhibitors     REACTION: tongue swelling  . Codeine     REACTION: nausea  . Lisinopril Swelling    Swelling of tongue  . Percocet (Oxycodone-Acetaminophen)     Lips swell    Medication list has been reviewed and updated.  Outpatient Prescriptions Prior to Visit  Medication Sig Dispense Refill  . amLODipine (NORVASC) 5 MG tablet Take 1 tablet (5 mg total) by mouth daily.  90 tablet  3  . aspirin 81 MG tablet Take 81  mg by mouth daily.        . carvedilol (COREG) 12.5 MG tablet Take 12.5 mg by mouth 2 (two) times daily with a meal.        . Cholecalciferol (VITAMIN D-3 PO) Take 1 tablet by mouth daily.      . Cyanocobalamin (VITAMIN B 12 PO) 1057mcg  1 tablet by mouth daily/       . EPINEPHrine (EPIPEN 2-PAK IJ) Inject as directed as directed.       . fesoterodine (TOVIAZ) 8 MG TB24 Take 8 mg by mouth daily.      . furosemide (LASIX) 20 MG tablet Take 20 mg by mouth daily as needed.      . gabapentin (NEURONTIN) 300 MG capsule Take 1 capsule (300 mg total) by mouth 3 (three) times daily.  270 capsule  3  . glimepiride (AMARYL) 2 MG tablet Take 2 mg by mouth 2 (two) times daily.      Marland Kitchen HYDROcodone-acetaminophen (NORCO/VICODIN) 5-325 MG per tablet Take 1 tablet by mouth every 6 (six) hours as needed.      . isosorbide mononitrate (IMDUR) 30 MG 24 hr tablet Take 30 mg by mouth daily.      . metFORMIN (GLUCOPHAGE-XR) 500 MG 24 hr tablet TAKE TWO TABLETS BY MOUTH TWICE DAILY  320 tablet  2  . nitroGLYCERIN (NITROSTAT) 0.4 MG SL tablet Place 0.4 mg under the tongue every 5 (five) minutes as needed. For chest pain      . pantoprazole (PROTONIX) 40 MG tablet Take 1 tablet (40 mg total) by mouth daily.  90 tablet  3  . pravastatin (PRAVACHOL) 80 MG tablet Take 1 tablet (80 mg total) by mouth daily.  90 tablet  3  . predniSONE (DELTASONE) 10 MG tablet Take 3 pills once daily by mouth for 3 days, then 2 pills once daily for 3 days, then 1 pill once daily for 3 days and then stop  18 tablet  0  . tapentadol (NUCYNTA) 50 MG TABS Take by mouth as needed.        Review of Systems:   GEN: No fevers, chills. Nontoxic. Primarily MSK c/o today. MSK: Detailed in the HPI GI: tolerating PO intake without difficulty Neuro: No numbness, parasthesias, or tingling associated. Otherwise the pertinent positives of the ROS are noted above.    Physical Examination: Filed Vitals:   03/30/12 1606  BP: 128/68  Pulse: 84  Temp:  98.2 F (36.8 C)  TempSrc: Oral  Weight: 155 lb (70.308 kg)    There is no height on file to calculate BMI. Ideal Body Weight:     GEN: WDWN, NAD, Non-toxic, Alert & Oriented x 3 HEENT: Atraumatic, Normocephalic.  Ears and Nose: No external deformity. EXTR: No clubbing/cyanosis/edema NEURO: antalgic gait, slight forward flexion, slow PSYCH: Normally interactive. Conversant. Not depressed or anxious appearing.  Calm demeanor.   HIP EXAM: SIDE: R ROM: Abduction, Flexion, Internal and External range of motion: full Pain with terminal IROM and EROM: none GTB: notably tender on the R SLR: NEG Knees: No effusion FABER: NT REVERSE FABER: NT, neg Piriformis: NT at direct palpation Str: flexion: 5/5 abduction: 4+/5 adduction: 5/5 Strength testing non-tender     Assessment and Plan:  1. Trochanteric bursitis of right hip    Probably secondary from altered gait, status post spine surgery and with altered gait and using a walker. Also reviewed the basic rehabilitation with her.  Trochanteric Bursitis Injection, RIGHT Verbal consent obtained. Risks (including infection, potential atrophy), benefits, and alternatives reviewed. Greater trochanter sterilely prepped with Chloraprep. Ethyl Chloride used for anesthesia. 8 cc of Lidocaine 1% injected with 2 cc of 40 mg Depo-Medrol into trochanteric bursa at area of maximal tenderness at greater trochanter. Needle taken to bone to troch bursa, flows easily. Bursa massaged. No bleeding and no complications. Decreased pain after injection. Needle: 22 gauge spinal needle   [name], MD

## 2012-04-03 ENCOUNTER — Telehealth: Payer: Self-pay

## 2012-04-03 NOTE — Telephone Encounter (Signed)
I believe she has vicodin which she can use in a limited fashion. Will foraward to PCP for further recommendations.

## 2012-04-03 NOTE — Telephone Encounter (Signed)
Pt received shot in rt hip on 03/30/12. Pt said rt hip pain and pain down rt leg is no better. Pain level now is 8. Pt has one kidney and cannot take ibuprofen or tylenol. Pt is periodically applying ice to hip and pt walking with walker.Please advise. [facility] [address]

## 2012-04-03 NOTE — Telephone Encounter (Signed)
Pt notified to take the vicodin until her PCP can give her further recommendations.

## 2012-04-05 NOTE — Telephone Encounter (Signed)
Will forward to Dr Lorelei Pont who did her injection to see if he has any input

## 2012-04-06 ENCOUNTER — Telehealth: Payer: Self-pay | Admitting: Family Medicine

## 2012-04-06 DIAGNOSIS — Z9889 Other specified postprocedural states: Secondary | ICD-10-CM

## 2012-04-06 DIAGNOSIS — M7060 Trochanteric bursitis, unspecified hip: Secondary | ICD-10-CM

## 2012-04-06 DIAGNOSIS — R269 Unspecified abnormalities of gait and mobility: Secondary | ICD-10-CM

## 2012-04-06 NOTE — Telephone Encounter (Signed)
[place], MD 04/06/2012, 5:26 PM

## 2012-04-06 NOTE — Telephone Encounter (Signed)
  I will quote my note from earlier today:  I think all stemming from abnormal gait that caused the bursitis -- going to PT for strengthening and gait training would be a good idea. I am happy to make that referral and have our office set that up.

## 2012-04-06 NOTE — Telephone Encounter (Signed)
Patient calling, she had an injection on 10/30 for bursitis in her right hip.  Also had po medications to take over the weekend.  Continues to have pain with only slight improvement.  Wanting to know what to do?  She has taken the Vicodin that she had on hand. (uses [facility])  Triaged per Hip Non-Injury.  She doesn't have a f/u appt. with Dr. Lorelei Pont.

## 2012-04-06 NOTE — Telephone Encounter (Signed)
Call  I think all stemming from abnormal gait that caused the bursitis -- going to PT for strengthening and gait training would be a good idea. I am happy to make that referral and have our office set that up.

## 2012-04-06 NOTE — Telephone Encounter (Signed)
From previous phone note it looked like he wanted to refer her to PT - did she get that info?  I will send this to him as well

## 2012-04-06 NOTE — Telephone Encounter (Signed)
Advised patient.  She is agreeable to PT, prefers to go to [facility] in [place].

## 2012-04-27 ENCOUNTER — Telehealth: Payer: Self-pay

## 2012-04-27 NOTE — Telephone Encounter (Signed)
Orders faxed to [facility].

## 2012-04-27 NOTE — Telephone Encounter (Signed)
Order written on px in IN box

## 2012-04-27 NOTE — Telephone Encounter (Signed)
Di Kindle [facility] left v/m requesting order for new meter for one touch ultra glucose meter, with glucose test strips and frequency of testing and lancets.Please advise.

## 2012-05-11 ENCOUNTER — Emergency Department (HOSPITAL_COMMUNITY)
Admission: EM | Admit: 2012-05-11 | Discharge: 2012-05-11 | Disposition: A | Discharge status: Home / Self Care | Payer: Medicare Other | Attending: Emergency Medicine | Admitting: Emergency Medicine

## 2012-05-11 ENCOUNTER — Encounter (HOSPITAL_COMMUNITY): Payer: Self-pay

## 2012-05-11 ENCOUNTER — Emergency Department (HOSPITAL_COMMUNITY): Payer: Medicare Other

## 2012-05-11 DIAGNOSIS — R32 Unspecified urinary incontinence: Secondary | ICD-10-CM | POA: Insufficient documentation

## 2012-05-11 DIAGNOSIS — Z8739 Personal history of other diseases of the musculoskeletal system and connective tissue: Secondary | ICD-10-CM | POA: Insufficient documentation

## 2012-05-11 DIAGNOSIS — E040 Nontoxic diffuse goiter: Secondary | ICD-10-CM | POA: Insufficient documentation

## 2012-05-11 DIAGNOSIS — Z860100 Personal history of colon polyps, unspecified: Secondary | ICD-10-CM | POA: Insufficient documentation

## 2012-05-11 DIAGNOSIS — Z79899 Other long term (current) drug therapy: Secondary | ICD-10-CM | POA: Insufficient documentation

## 2012-05-11 DIAGNOSIS — Z7982 Long term (current) use of aspirin: Secondary | ICD-10-CM | POA: Insufficient documentation

## 2012-05-11 DIAGNOSIS — F430 Acute stress reaction: Secondary | ICD-10-CM | POA: Insufficient documentation

## 2012-05-11 DIAGNOSIS — R0789 Other chest pain: Secondary | ICD-10-CM

## 2012-05-11 DIAGNOSIS — E785 Hyperlipidemia, unspecified: Secondary | ICD-10-CM | POA: Insufficient documentation

## 2012-05-11 DIAGNOSIS — I10 Essential (primary) hypertension: Secondary | ICD-10-CM | POA: Insufficient documentation

## 2012-05-11 DIAGNOSIS — E119 Type 2 diabetes mellitus without complications: Secondary | ICD-10-CM | POA: Insufficient documentation

## 2012-05-11 DIAGNOSIS — Z8719 Personal history of other diseases of the digestive system: Secondary | ICD-10-CM | POA: Insufficient documentation

## 2012-05-11 DIAGNOSIS — K219 Gastro-esophageal reflux disease without esophagitis: Secondary | ICD-10-CM | POA: Insufficient documentation

## 2012-05-11 DIAGNOSIS — Z8669 Personal history of other diseases of the nervous system and sense organs: Secondary | ICD-10-CM | POA: Insufficient documentation

## 2012-05-11 LAB — COMPREHENSIVE METABOLIC PANEL
ALT: 72 U/L — ABNORMAL HIGH (ref 0–35)
AST: 240 U/L — ABNORMAL HIGH (ref 0–37)
Albumin: 3.1 g/dL — ABNORMAL LOW (ref 3.5–5.2)
Alkaline Phosphatase: 114 U/L (ref 39–117)
CO2: 28 mEq/L (ref 19–32)
Chloride: 102 mEq/L (ref 96–112)
Creatinine, Ser: 0.93 mg/dL (ref 0.50–1.10)
GFR calc non Af Amer: 59 mL/min — ABNORMAL LOW (ref >90)
Glucose, Bld: 401 mg/dL — ABNORMAL HIGH (ref 70–99)
Potassium: 4.3 mEq/L (ref 3.5–5.1)
Sodium: 141 mEq/L (ref 135–145)
Total Bilirubin: 0.6 mg/dL (ref 0.3–1.2)

## 2012-05-11 LAB — CBC
MCH: 29.8 pg (ref 26.0–34.0)
MCV: 88.9 fL (ref 78.0–100.0)
Platelets: 188 10*3/uL (ref 150–400)
RDW: 13.4 % (ref 11.5–15.5)
WBC: 9.5 10*3/uL (ref 4.0–10.5)

## 2012-05-11 MED ORDER — GI COCKTAIL ~~LOC~~
30.0000 mL | Freq: Once | ORAL | Status: AC
  Administered 2012-05-11: 30 mL via ORAL
  Filled 2012-05-11: qty 30

## 2012-05-11 MED ORDER — MORPHINE SULFATE 2 MG/ML IJ SOLN
2.0000 mg | Freq: Once | INTRAMUSCULAR | Status: AC
  Administered 2012-05-11: 2 mg via INTRAVENOUS
  Filled 2012-05-11: qty 1

## 2012-05-11 MED ORDER — FAMOTIDINE 20 MG PO TABS
20.0000 mg | ORAL_TABLET | Freq: Once | ORAL | Status: AC
  Administered 2012-05-11: 20 mg via ORAL
  Filled 2012-05-11: qty 1

## 2012-05-11 MED ORDER — SODIUM CHLORIDE 0.9 % IV SOLN
INTRAVENOUS | Status: DC
  Administered 2012-05-11: 10 mL/h via INTRAVENOUS

## 2012-05-11 MED ORDER — SODIUM CHLORIDE 0.9 % IV BOLUS (SEPSIS)
1000.0000 mL | Freq: Once | INTRAVENOUS | Status: AC
  Administered 2012-05-11: 1000 mL via INTRAVENOUS

## 2012-05-11 NOTE — ED Notes (Signed)
I advised N. Pisciotta about Morgan Hubbard Blood sugar being 401 at 1256 prior to coming to CDU.  It was rechecked in CDU and it was 325.  No orders given.

## 2012-05-11 NOTE — ED Notes (Signed)
Increasing epigastric pain that radiates across chest since 4am. 324 mg ASA, and 2 Nitro.

## 2012-05-11 NOTE — ED Notes (Signed)
Patient is alert and orientedx4.  Patient was explained discharge instructions and she understood with no questions.  Daughter, [name] is transporting the patient home.

## 2012-05-11 NOTE — ED Provider Notes (Signed)
Morgan Hubbard is a 77 y.o. female transferred to [facility]U from [place]. Signout from Dr. Ashok Cordia as follows: Patient with atypical chest pain at rest she is transferred to the CDU pending Dr. troponin. Patient has had extensive cardiac workup in the past at [facility] From several years ago is reassuring her primary care is Dr. Alba Cory at [facility].  Plan is to plan is to DC home a second troponin returns on elevated. She will also need a referral to a local cardiologist. Her LFTs are elevated today we will also recommends DC of protocol and recheck of liver function tests.  Elevated blood glucose of 400 at intake. After hydration and prior todischarge patient's CBG was 285.  Pt seen and examined at the bedside she is resting comfortably with her daughter at the bedside. She is chest pain-free at this time. Heart is a regular rate and rhythm with no murmurs rubs or gallops, lung sounds are clear auscultation bilaterally abdominal exam is benign with no tenderness to palpation or rebound. Results are discussed with patient and her daughter. Return precautions are given. And discussion of elevated liver function tests and rechecks also discussed.  Results for orders placed during the hospital encounter of 05/11/12  COMPREHENSIVE METABOLIC PANEL      Component Value Range   Sodium 141  135 - 145 mEq/L   Potassium 4.3  3.5 - 5.1 mEq/L   Chloride 102  96 - 112 mEq/L   CO2 28  19 - 32 mEq/L   Glucose, Bld 401 (*) 70 - 99 mg/dL   BUN 15  6 - 23 mg/dL   Creatinine, Ser 0.93  0.50 - 1.10 mg/dL   Calcium 9.0  8.4 - 10.5 mg/dL   Total Protein 6.0  6.0 - 8.3 g/dL   Albumin 3.1 (*) 3.5 - 5.2 g/dL   AST 240 (*) 0 - 37 U/L   ALT 72 (*) 0 - 35 U/L   Alkaline Phosphatase 114  39 - 117 U/L   Total Bilirubin 0.6  0.3 - 1.2 mg/dL   GFR calc non Af Amer 59 (*) >90 mL/min   GFR calc Af Amer 68 (*) >90 mL/min  CBC      Component Value Range   WBC 9.5  4.0 - 10.5 K/uL   RBC 4.59  3.87 - 5.11 MIL/uL   Hemoglobin 13.7  12.0 -  15.0 g/dL   HCT 40.8  36.0 - 46.0 %   MCV 88.9  78.0 - 100.0 fL   MCH 29.8  26.0 - 34.0 pg   MCHC 33.6  30.0 - 36.0 g/dL   RDW 13.4  11.5 - 15.5 %   Platelets 188  150 - 400 K/uL  TROPONIN I      Component Value Range   Troponin I <0.30  <0.30 ng/mL  LIPASE, BLOOD      Component Value Range   Lipase 16  11 - 59 U/L  GLUCOSE, CAPILLARY      Component Value Range   Glucose-Capillary 325 (*) 70 - 99 mg/dL  TROPONIN I      Component Value Range   Troponin I <0.30  <0.30 ng/mL   Dg Chest 2 View  05/11/2012  *RADIOLOGY REPORT*  Clinical Data: Left-sided chest pain.  CHEST - 2 VIEW  Comparison: 03/06/2011  Findings: Mild cardiomegaly is stable.  No evidence of congestive heart failure.  Both lungs are clear.  No evidence of pleural effusion.  No mass or lymphadenopathy identified.  IMPRESSION: Stable cardiomegaly.  No acute findings.   Original Report Authenticated By: Earle Gell, M.D.      Monico Blitz, PA-C 05/11/12 1756

## 2012-05-11 NOTE — ED Provider Notes (Signed)
History     CSN: [id number]  Arrival date & time 05/11/12  1118   First MD Initiated Contact with Patient 05/11/12 1134      Chief Complaint  Patient presents with  . Chest Pain    (Consider location/radiation/quality/duration/timing/severity/associated sxs/prior treatment) Patient is a 77 y.o. female presenting with chest pain. The history is provided by the patient.  Chest Pain Pertinent negatives for primary symptoms include no fever, no shortness of breath, no cough, no palpitations and no abdominal pain.   pt c/o onset mid cp at approximately 4 am today at rest. Midchest. Dull, moderate. Non radiating. Constant. No relief w ntg x 2. Tried gi meds w partial relief.  Denies associated nv, sob or diaphoresis. States hx several cardiac caths in past, denies prior stent, ?cad. +hx gerd. No other recent cp or discomfort, even w activity/exertion. No unusual sob or doe, no fatigue. No cough or uri c/o. No fever or chills. No pleuritic cp. No leg pain or swelling.      Past Medical History  Diagnosis Date  . Arthritis   . Diabetes mellitus   . Headache   . GERD (gastroesophageal reflux disease)   . Ulcer   . Hyperlipidemia   . Migraine   . Colon polyps   . Thyroid disease   . Shingles   . Urine incontinence   . Goiter past remote    treated with RI  . Other primary cardiomyopathies   . Hyperpotassemia   . Inflammatory and toxic neuropathy, unspecified   . Reflex sympathetic dystrophy, unspecified   . Hypertonicity of bladder   . Hypertension   . Shortness of breath     ambulation  . Migraine     hx of  . Vertigo     hx of  . Overactive bladder     Past Surgical History  Procedure Date  . Breast surgery 19599    breast cyst  . Finger surgery [place]  . Tubal ligation 1976  . Kidney donation 1989    left kidney  . Spine surgery 1999  . Ct scan 3/12    outside hosp- lung nodule and gallstones  . Upper gastrointestinal endoscopy   . Colonoscopy 7/12     normal (hx of polyps in past)   . Back surgery   . Eye surgery     cataract removal in left eye; several eye injections  . Cardiac catheterization   . Lumbar laminectomy/decompression microdiscectomy 01/31/2012    Procedure: LUMBAR LAMINECTOMY/DECOMPRESSION MICRODISCECTOMY 2 LEVELS;  Surgeon: Eustace Moore, MD;  Location: [facility];  Service: Neurosurgery;  Laterality: N/A;  Thoracic twelve-lumbar one, lumbar one-two laminectomy     Family History  Problem Relation Age of Onset  . Arthritis Mother   . Cancer Mother     uterine and mouth  . Hyperlipidemia Mother   . Stroke Mother   . Hypertension Mother   . Alcohol abuse Father   . Diabetes Father   . Cancer Sister     breast  . Diabetes Sister   . Cancer Brother     lung cancer  . Kidney disease Brother   . Diabetes Brother   . Hyperlipidemia Sister   . Heart disease Sister   . Hypertension Sister   . Diabetes Sister   . Hyperlipidemia Brother   . Kidney failure Brother   . Diabetes Daughter     History  Substance Use Topics  . Smoking status: Never Smoker   .  Smokeless tobacco: Never Used  . Alcohol Use: No    OB History    Grav Para Term Preterm Abortions TAB SAB Ect Mult Living                  Review of Systems  Constitutional: Negative for fever and chills.  HENT: Negative for neck pain.   Eyes: Negative for redness.  Respiratory: Negative for cough and shortness of breath.   Cardiovascular: Positive for chest pain. Negative for palpitations and leg swelling.  Gastrointestinal: Negative for abdominal pain.  Genitourinary: Negative for flank pain.  Musculoskeletal: Negative for back pain.  Skin: Negative for rash.  Neurological: Negative for headaches.  Hematological: Does not bruise/bleed easily.  Psychiatric/Behavioral: Negative for confusion.    Allergies  Ace inhibitors; Codeine; Lisinopril; and Percocet  Home Medications   Current Outpatient Rx  Name  Route  Sig  Dispense  Refill  .  AMLODIPINE BESYLATE 5 MG PO TABS   Oral   Take 1 tablet (5 mg total) by mouth daily.   90 tablet   3   . ASPIRIN 81 MG PO TABS   Oral   Take 81 mg by mouth daily.           Marland Kitchen CARVEDILOL 12.5 MG PO TABS   Oral   Take 12.5 mg by mouth 2 (two) times daily with a meal.           . VITAMIN D 1000 UNITS PO TABS   Oral   Take 1,000 Units by mouth daily.         Marland Kitchen EPIPEN 2-PAK IJ   Injection   Inject as directed as directed.          . FESOTERODINE FUMARATE ER 8 MG PO TB24   Oral   Take 8 mg by mouth daily.         Marland Kitchen GABAPENTIN 300 MG PO CAPS   Oral   Take 1 capsule (300 mg total) by mouth 3 (three) times daily.   270 capsule   3   . GLIMEPIRIDE 2 MG PO TABS   Oral   Take 2 mg by mouth 2 (two) times daily.         Marland Kitchen HYDROCODONE-ACETAMINOPHEN 5-325 MG PO TABS   Oral   Take 1 tablet by mouth every 6 (six) hours as needed. For pain         . ISOSORBIDE MONONITRATE ER 30 MG PO TB24   Oral   Take 30 mg by mouth daily.         Marland Kitchen METFORMIN HCL ER 500 MG PO TB24   Oral   Take 1,000 mg by mouth 2 (two) times daily.         Marland Kitchen MILK THISTLE PO   Oral   Take 1 capsule by mouth daily.         Marland Kitchen NITROGLYCERIN 0.4 MG SL SUBL   Sublingual   Place 0.4 mg under the tongue every 5 (five) minutes as needed. For chest pain         . PANTOPRAZOLE SODIUM 40 MG PO TBEC   Oral   Take 1 tablet (40 mg total) by mouth daily.   90 tablet   3   . PRAVASTATIN SODIUM 80 MG PO TABS   Oral   Take 80 mg by mouth at bedtime.         Marland Kitchen TAPENTADOL HCL 50 MG PO TABS   Oral   Take  50 mg by mouth 3 (three) times daily as needed. For severe pain         . VITAMIN B-12 1000 MCG PO TABS   Oral   Take 1,000 mcg by mouth daily.           BP 107/51  Pulse 77  Temp 98.7 F (37.1 C) (Oral)  Resp 9  SpO2 97%  LMP 06/03/1992  Physical Exam  Nursing note and vitals reviewed. Constitutional: She appears well-developed and well-nourished. No distress.  HENT:   Head: Atraumatic.  Nose: Nose normal.  Eyes: Conjunctivae normal are normal. No scleral icterus.  Neck: Neck supple. No tracheal deviation present.  Cardiovascular: Normal rate, regular rhythm, normal heart sounds and intact distal pulses.  Exam reveals no gallop and no friction rub.   No murmur heard. Pulmonary/Chest: Effort normal. No respiratory distress. She exhibits no tenderness.  Abdominal: Soft. Normal appearance. She exhibits no distension. There is no tenderness.  Musculoskeletal: She exhibits no edema and no tenderness.  Neurological: She is alert.  Skin: Skin is warm and dry. No rash noted.  Psychiatric: She has a normal mood and affect.    ED Course  Procedures (including critical care time)   Labs Reviewed  COMPREHENSIVE METABOLIC PANEL  CBC  TROPONIN I    Results for orders placed during the hospital encounter of 05/11/12  COMPREHENSIVE METABOLIC PANEL      Component Value Range   Sodium 141  135 - 145 mEq/L   Potassium 4.3  3.5 - 5.1 mEq/L   Chloride 102  96 - 112 mEq/L   CO2 28  19 - 32 mEq/L   Glucose, Bld 401 (*) 70 - 99 mg/dL   BUN 15  6 - 23 mg/dL   Creatinine, Ser 0.93  0.50 - 1.10 mg/dL   Calcium 9.0  8.4 - 10.5 mg/dL   Total Protein 6.0  6.0 - 8.3 g/dL   Albumin 3.1 (*) 3.5 - 5.2 g/dL   AST 240 (*) 0 - 37 U/L   ALT 72 (*) 0 - 35 U/L   Alkaline Phosphatase 114  39 - 117 U/L   Total Bilirubin 0.6  0.3 - 1.2 mg/dL   GFR calc non Af Amer 59 (*) >90 mL/min   GFR calc Af Amer 68 (*) >90 mL/min  CBC      Component Value Range   WBC 9.5  4.0 - 10.5 K/uL   RBC 4.59  3.87 - 5.11 MIL/uL   Hemoglobin 13.7  12.0 - 15.0 g/dL   HCT 40.8  36.0 - 46.0 %   MCV 88.9  78.0 - 100.0 fL   MCH 29.8  26.0 - 34.0 pg   MCHC 33.6  30.0 - 36.0 g/dL   RDW 13.4  11.5 - 15.5 %   Platelets 188  150 - 400 K/uL  TROPONIN I      Component Value Range   Troponin I <0.30  <0.30 ng/mL  LIPASE, BLOOD      Component Value Range   Lipase 16  11 - 59 U/L  GLUCOSE, CAPILLARY       Component Value Range   Glucose-Capillary 325 (*) 70 - 99 mg/dL   Dg Chest 2 View  05/11/2012  *RADIOLOGY REPORT*  Clinical Data: Left-sided chest pain.  CHEST - 2 VIEW  Comparison: 03/06/2011  Findings: Mild cardiomegaly is stable.  No evidence of congestive heart failure.  Both lungs are clear.  No evidence of pleural effusion.  No  mass or lymphadenopathy identified.  IMPRESSION: Stable cardiomegaly.  No acute findings.   Original Report Authenticated By: Earle Gell, M.D.       MDM  Iv ns. Labs. Cxr. Ecg.    Date: 05/11/2012  Rate: 78  Rhythm: normal sinus rhythm  QRS Axis: left  Intervals: normal  ST/T Wave abnormalities: nonspecific ST/T changes  Conduction Disutrbances:left bundle branch block  Narrative Interpretation:   Old EKG Reviewed: unchanged  Reviewed nursing notes and prior charts for additional history.   From review of notes in EPIC:  Cardiac cath from 08/30/10 [facility]) showed normal coronary anatomy, no significant CAD    Recheck no pain. Will plan delta trop at 3 hrs.    Discussed pt w and signed out to [place] in CDU.  Repeat troponin pending.  If normal and not increasing, anticipate d/c w close pcp/card f/u. As ast/alt elevated, will hold pravachol and have f/u pcp.  Will also need close pcp f/u re hyperglycemia.     Mirna Mires, MD 05/11/12 [phone]

## 2012-05-11 NOTE — ED Notes (Signed)
Family at bedside. 

## 2012-05-12 NOTE — ED Provider Notes (Signed)
Medical screening examination/treatment/procedure(s) were conducted as a shared visit with non-physician practitioner(s) and myself.  I personally evaluated the patient during the encounter See h and p  Mirna Mires, MD 05/12/12 1105

## 2012-05-18 ENCOUNTER — Ambulatory Visit (INDEPENDENT_AMBULATORY_CARE_PROVIDER_SITE_OTHER): Payer: Medicare Other | Admitting: Family Medicine

## 2012-05-18 ENCOUNTER — Telehealth: Payer: Self-pay

## 2012-05-18 ENCOUNTER — Encounter: Payer: Self-pay | Admitting: Family Medicine

## 2012-05-18 VITALS — BP 146/80 | HR 82 | Temp 97.6°F | Ht 62.5 in | Wt 154.2 lb

## 2012-05-18 DIAGNOSIS — E119 Type 2 diabetes mellitus without complications: Secondary | ICD-10-CM

## 2012-05-18 DIAGNOSIS — R7401 Elevation of levels of liver transaminase levels: Secondary | ICD-10-CM | POA: Insufficient documentation

## 2012-05-18 DIAGNOSIS — K7689 Other specified diseases of liver: Secondary | ICD-10-CM

## 2012-05-18 DIAGNOSIS — K760 Fatty (change of) liver, not elsewhere classified: Secondary | ICD-10-CM

## 2012-05-18 DIAGNOSIS — I10 Essential (primary) hypertension: Secondary | ICD-10-CM

## 2012-05-18 DIAGNOSIS — R0789 Other chest pain: Secondary | ICD-10-CM

## 2012-05-18 LAB — HEMOGLOBIN A1C: Hgb A1c MFr Bld: 11.6 % — ABNORMAL HIGH (ref 4.6–6.5)

## 2012-05-18 NOTE — Assessment & Plan Note (Signed)
bp in fair control at this time  No changes needed  Disc lifstyle change with low sodium diet and exercise   Is ace allergic

## 2012-05-18 NOTE — Assessment & Plan Note (Signed)
Rev ER notes and studies- reassuring  Needs to get est with local cardiologist  Ref done

## 2012-05-18 NOTE — Telephone Encounter (Signed)
Pt was seen today and pt has enough med to last until after end of year; pt will be in donut hole if called in today. Please do not refill any meds today.

## 2012-05-18 NOTE — Assessment & Plan Note (Signed)
abd [place] planned Hx of gallstones and fatty liver  Asymptomatic currently

## 2012-05-18 NOTE — Progress Notes (Signed)
Subjective:    Patient ID: Morgan Hubbard, female    DOB: [birth year], 77 y.o.   MRN: [id number]  HPI Here for f/u of ER visit 05/11/12  Was in for atypical cp Cxr/ekg and enzymes ok - but they did recommend that she get est with cardiol  Has had workup at [facility] and also at [facility] - needs someone here now  Wants to go to [facility]   Chest pain is better now  She thinks it was exhaustion and stress-had travel/ sister died and had a lot of family events She had just overdone it    Sugar was quite high at 400- then after fluids 285 She has not seen Dr Loanne Drilling in quite a while -she would rather see someone else She has been very careless- eating a lot of sugar and fruit-esp on vacation  In general checks her sugar  Am -- usually runs 145-170s  Not checking in afternoons  Is still taking her medicines  Needs px sent in  Is thirsty and frequently urinating  opthy is next Monday   Lab Results  Component Value Date   HGBA1C 9.2* 06/21/2011    LFT up also significantly Lab Results  Component Value Date   ALT 72* 05/11/2012   AST 240* 05/11/2012   ALKPHOS 114 05/11/2012   BILITOT 0.6 05/11/2012  med list incl vicodin - takes at least one a day  Also pravachol - will need to hold  Has hx of fatty liver  Has also had gallstones   Hx of gallstones in the past     Chemistry      Component Value Date/Time   NA 141 05/11/2012 1202   K 4.3 05/11/2012 1202   CL 102 05/11/2012 1202   CO2 28 05/11/2012 1202   BUN 15 05/11/2012 1202   CREATININE 0.93 05/11/2012 1202      Component Value Date/Time   CALCIUM 9.0 05/11/2012 1202   ALKPHOS 114 05/11/2012 1202   AST 240* 05/11/2012 1202   ALT 72* 05/11/2012 1202   BILITOT 0.6 05/11/2012 1202        Patient Active Problem List  Diagnosis  . DM  . HYPERCHOLESTEROLEMIA  . HYPERKALEMIA  . REFLEX SYMPATHETIC DYSTROPHY  . POLYNEUROPATHY  . HYPERTENSION  . CARDIOMYOPATHY  . OVERACTIVE BLADDER  . Fatty liver  . Left sided abdominal pain  .  Tongue swelling  . Goiter  . Hematuria  . Left shoulder pain  . Renal insufficiency  . Spinal stenosis of lumbar region  . Mixed incontinence urge and stress  . Urticaria  . Right hip pain   Past Medical History  Diagnosis Date  . Arthritis   . Diabetes mellitus   . Headache   . GERD (gastroesophageal reflux disease)   . Ulcer   . Hyperlipidemia   . Migraine   . Colon polyps   . Thyroid disease   . Shingles   . Urine incontinence   . Goiter past remote    treated with RI  . Other primary cardiomyopathies   . Hyperpotassemia   . Inflammatory and toxic neuropathy, unspecified   . Reflex sympathetic dystrophy, unspecified   . Hypertonicity of bladder   . Hypertension   . Shortness of breath     ambulation  . Migraine     hx of  . Vertigo     hx of  . Overactive bladder    Past Surgical History  Procedure Date  . Breast surgery [phone]  breast cyst  . Finger surgery [place]  . Tubal ligation 1976  . Kidney donation 1989    left kidney  . Spine surgery 1999  . Ct scan 3/12    outside hosp- lung nodule and gallstones  . Upper gastrointestinal endoscopy   . Colonoscopy 7/12    normal (hx of polyps in past)   . Back surgery   . Eye surgery     cataract removal in left eye; several eye injections  . Cardiac catheterization   . Lumbar laminectomy/decompression microdiscectomy 01/31/2012    Procedure: LUMBAR LAMINECTOMY/DECOMPRESSION MICRODISCECTOMY 2 LEVELS;  Surgeon: Eustace Moore, MD;  Location: [facility];  Service: Neurosurgery;  Laterality: N/A;  Thoracic twelve-lumbar one, lumbar one-two laminectomy    History  Substance Use Topics  . Smoking status: Never Smoker   . Smokeless tobacco: Never Used  . Alcohol Use: No   Family History  Problem Relation Age of Onset  . Arthritis Mother   . Cancer Mother     uterine and mouth  . Hyperlipidemia Mother   . Stroke Mother   . Hypertension Mother   . Alcohol abuse Father   . Diabetes Father   .  Cancer Sister     breast  . Diabetes Sister   . Cancer Brother     lung cancer  . Kidney disease Brother   . Diabetes Brother   . Hyperlipidemia Sister   . Heart disease Sister   . Hypertension Sister   . Diabetes Sister   . Hyperlipidemia Brother   . Kidney failure Brother   . Diabetes Daughter    Allergies  Allergen Reactions  . Ace Inhibitors     REACTION: tongue swelling  . Codeine     REACTION: nausea  . Lisinopril Swelling    Swelling of tongue  . Percocet (Oxycodone-Acetaminophen)     Lips swell   Current Outpatient Prescriptions on File Prior to Visit  Medication Sig Dispense Refill  . amLODipine (NORVASC) 5 MG tablet Take 1 tablet (5 mg total) by mouth daily.  90 tablet  3  . aspirin 81 MG tablet Take 81 mg by mouth daily.        . carvedilol (COREG) 12.5 MG tablet Take 12.5 mg by mouth 2 (two) times daily with a meal.        . cholecalciferol (VITAMIN D) 1000 UNITS tablet Take 1,000 Units by mouth daily.      Marland Kitchen EPINEPHrine (EPIPEN 2-PAK IJ) Inject as directed as directed.       . gabapentin (NEURONTIN) 300 MG capsule Take 1 capsule (300 mg total) by mouth 3 (three) times daily.  270 capsule  3  . glimepiride (AMARYL) 2 MG tablet Take 2 mg by mouth 2 (two) times daily.      Marland Kitchen HYDROcodone-acetaminophen (NORCO/VICODIN) 5-325 MG per tablet Take 1 tablet by mouth every 6 (six) hours as needed. For pain      . isosorbide mononitrate (IMDUR) 30 MG 24 hr tablet Take 30 mg by mouth daily.      . metFORMIN (GLUCOPHAGE-XR) 500 MG 24 hr tablet Take 1,000 mg by mouth 2 (two) times daily.      Marland Kitchen MILK THISTLE PO Take 1 capsule by mouth daily.      . nitroGLYCERIN (NITROSTAT) 0.4 MG SL tablet Place 0.4 mg under the tongue every 5 (five) minutes as needed. For chest pain      . pantoprazole (PROTONIX) 40 MG tablet Take 1  tablet (40 mg total) by mouth daily.  90 tablet  3  . pravastatin (PRAVACHOL) 80 MG tablet Take 80 mg by mouth at bedtime.      . tapentadol (NUCYNTA) 50 MG TABS  Take 50 mg by mouth 3 (three) times daily as needed. For severe pain      . vitamin B-12 (CYANOCOBALAMIN) 1000 MCG tablet Take 1,000 mcg by mouth daily.        Review of Systems Review of Systems  Constitutional: Negative for fever, appetite change,  and unexpected weight change. pos for fatigue Eyes: Negative for pain and visual disturbance.  Respiratory: Negative for cough and shortness of breath.   Cardiovascular: Negative for cp or palpitations   neg for PND or orthopnea or pedal edema  Gastrointestinal: Negative for nausea, diarrhea and constipation.  Genitourinary: Negative for urgency and frequency. pos for thirst  Skin: Negative for pallor or rash   Neurological: Negative for weakness, light-headedness, numbness and headaches.  Hematological: Negative for adenopathy. Does not bruise/bleed easily.  Psychiatric/Behavioral: Negative for dysphoric mood. The patient is not nervous/anxious.         Objective:   Physical Exam  Constitutional: She appears well-developed and well-nourished. No distress.       overwt and well appearing   HENT:  Head: Normocephalic and atraumatic.  Mouth/Throat: Oropharynx is clear and moist.  Eyes: Conjunctivae normal and EOM are normal. Pupils are equal, round, and reactive to light. No scleral icterus.  Neck: Normal range of motion. Neck supple. No JVD present. Carotid bruit is not present. No thyromegaly present.  Cardiovascular: Normal rate, regular rhythm, normal heart sounds and intact distal pulses.  Exam reveals no gallop.   Pulmonary/Chest: Effort normal and breath sounds normal. No respiratory distress. She has no wheezes. She has no rales.  Abdominal: Soft. Bowel sounds are normal. She exhibits no distension, no abdominal bruit and no mass. There is no tenderness.  Musculoskeletal: Normal range of motion. She exhibits no edema and no tenderness.  Lymphadenopathy:    She has no cervical adenopathy.  Neurological: She is alert. She has normal  reflexes. No cranial nerve deficit. She exhibits normal muscle tone. Coordination normal.  Skin: Skin is warm and dry. No rash noted. No erythema. No pallor.  Psychiatric: She has a normal mood and affect.          Assessment & Plan:

## 2012-05-18 NOTE — Assessment & Plan Note (Signed)
Worse and symptomatic  Pt has been lost to follow up and would like to est with a new endocrinologist  Rev DM diet Ref done A1c today Unfortunately is ace allergic

## 2012-05-18 NOTE — Telephone Encounter (Signed)
Ok, will hold off

## 2012-05-18 NOTE — Patient Instructions (Signed)
We will do cardiology and endocrine referrals at check out  Hold your pravachol for now in light of liver tests We will refer you for abdominal ultrasound at check out  Follow up with me 2-3 weeks after your ultrasound Get to get back on track with diabetic diet  Labs today

## 2012-05-18 NOTE — Assessment & Plan Note (Signed)
Hx of in past  LFTs are up further however abd [place] planned

## 2012-05-20 ENCOUNTER — Ambulatory Visit
Admission: RE | Admit: 2012-05-20 | Discharge: 2012-05-20 | Disposition: A | Discharge status: Home / Self Care | Payer: Medicare Other | Source: Ambulatory Visit | Attending: Family Medicine | Admitting: Family Medicine

## 2012-05-20 DIAGNOSIS — R7401 Elevation of levels of liver transaminase levels: Secondary | ICD-10-CM

## 2012-05-20 DIAGNOSIS — K760 Fatty (change of) liver, not elsewhere classified: Secondary | ICD-10-CM

## 2012-05-25 LAB — HM DIABETES EYE EXAM

## 2012-05-26 ENCOUNTER — Telehealth: Payer: Self-pay | Admitting: Family Medicine

## 2012-05-26 DIAGNOSIS — K8020 Calculus of gallbladder without cholecystitis without obstruction: Secondary | ICD-10-CM

## 2012-05-26 DIAGNOSIS — R7401 Elevation of levels of liver transaminase levels: Secondary | ICD-10-CM

## 2012-05-26 NOTE — Telephone Encounter (Signed)
Message copied by Abner Greenspan on Tue May 26, 2012 12:22 PM ------      Message from: Tammi Sou      Created: Mon May 25, 2012  4:24 PM       Pt notified of [place] results an agrees with referral to general surgeon

## 2012-05-26 NOTE — Telephone Encounter (Signed)
Ref to gen surg for gallstones

## 2012-05-28 ENCOUNTER — Ambulatory Visit: Payer: Medicare Other | Admitting: Cardiovascular Disease

## 2012-05-28 ENCOUNTER — Ambulatory Visit (INDEPENDENT_AMBULATORY_CARE_PROVIDER_SITE_OTHER): Payer: Medicare Other | Admitting: Cardiovascular Disease

## 2012-05-28 ENCOUNTER — Encounter: Payer: Self-pay | Admitting: Cardiovascular Disease

## 2012-05-28 VITALS — BP 110/62 | HR 76 | Ht 62.0 in | Wt 152.5 lb

## 2012-05-28 DIAGNOSIS — E7800 Pure hypercholesterolemia, unspecified: Secondary | ICD-10-CM

## 2012-05-28 DIAGNOSIS — I428 Other cardiomyopathies: Secondary | ICD-10-CM

## 2012-05-28 DIAGNOSIS — R0789 Other chest pain: Secondary | ICD-10-CM

## 2012-05-28 DIAGNOSIS — E119 Type 2 diabetes mellitus without complications: Secondary | ICD-10-CM

## 2012-05-28 DIAGNOSIS — I10 Essential (primary) hypertension: Secondary | ICD-10-CM

## 2012-05-28 NOTE — Assessment & Plan Note (Signed)
Pravastatin on hold given climbing AST and ALT. Gallbladder disease as well.

## 2012-05-28 NOTE — Assessment & Plan Note (Signed)
Atypical type chest pain, particularly in the setting of cardiac catheterization in 2012 showing no significant coronary artery disease. No further testing at this time.

## 2012-05-28 NOTE — Assessment & Plan Note (Signed)
Poorly controlled, she has a referral to endocrine.

## 2012-05-28 NOTE — Assessment & Plan Note (Signed)
History of nonischemic cardiomyopathy, last ejection fraction 45%. Continue current medications. She reports possible angioedema in the past on ACE inhibitor. Blood pressure low, will not add ARB.

## 2012-05-28 NOTE — Assessment & Plan Note (Signed)
Blood pressure is well controlled on today's visit. No changes made to the medications. 

## 2012-05-28 NOTE — Patient Instructions (Addendum)
You are doing well. No medication changes were made.  Please call us if you have new issues that need to be addressed before your next appt.  Your physician wants you to follow-up in: 6 months.  You will receive a reminder letter in the mail two months in advance. If you don't receive a letter, please call our office to schedule the follow-up appointment.   

## 2012-05-28 NOTE — Progress Notes (Signed)
Patient ID: Morgan Hubbard, female    DOB: [birth year], 77 y.o.   MRN: [id number]  HPI Comments: Morgan Hubbard is a very pleasant 77 year old woman with past medical history of nonischemic cardiopathy, prior ejection fraction 30% and has improved on recent outside echocardiogram to 45%, old left bundle branch block, nephrectomy/donated kidney in 1989, possible angioedema on lisinopril, back surgery x3, poorly controlled diabetes who presents after recent episode of chest pain.  She reports that several weeks ago, she woke at 4 in the morning with chest pain. No significant radiation of her pain apart from the fact that it felt like a band around her chest. She was given nitroglycerin by EMTs. Symptoms resolved in the emergency room. No further episodes of chest pain since that time. She is relatively active at baseline with some limitations secondary to chronic back pain. She does not workout, she eats what she likes and has had recent trips to [place] with her cruise as well as additional trips with poor eating habits. She does report no chest pain with exertion, she does have mild shortness of breath with certain activities which is chronic. No remote smoking history.  She reports cardiac catheterization 08/30/2010 in [place] that showed no significant coronary artery disease Last echocardiogram at outside facility showed ejection fraction 45% Hemoglobin A1c 11.6 Recent climb in her LFTs, pravastatin recently held  She does report her sister has history of pancreatic cancer Recent ultrasound showing gallstones and fatty liver  EKG shows normal sinus rhythm with rate 76 beats per minute with left bundle branch block     Outpatient Encounter Prescriptions as of 05/28/2012  Medication Sig Dispense Refill  . amLODipine (NORVASC) 5 MG tablet Take 1 tablet (5 mg total) by mouth daily.  90 tablet  3  . aspirin 81 MG tablet Take 81 mg by mouth daily.        . carvedilol (COREG) 12.5 MG tablet Take 12.5  mg by mouth 2 (two) times daily with a meal.        . cholecalciferol (VITAMIN D) 1000 UNITS tablet Take 1,000 Units by mouth daily.      Marland Kitchen EPINEPHrine (EPIPEN 2-PAK IJ) Inject as directed as directed.       . fesoterodine (TOVIAZ) 8 MG TB24 Take 8 mg by mouth daily.      Marland Kitchen gabapentin (NEURONTIN) 300 MG capsule Take 1 capsule (300 mg total) by mouth 3 (three) times daily.  270 capsule  3  . glimepiride (AMARYL) 2 MG tablet Take 2 mg by mouth 2 (two) times daily.      Marland Kitchen HYDROcodone-acetaminophen (NORCO/VICODIN) 5-325 MG per tablet Take 1 tablet by mouth every 6 (six) hours as needed. For pain      . isosorbide mononitrate (IMDUR) 30 MG 24 hr tablet Take 30 mg by mouth daily.      . metFORMIN (GLUCOPHAGE-XR) 500 MG 24 hr tablet Take 1,000 mg by mouth 2 (two) times daily.      Marland Kitchen MILK THISTLE PO Take 1 capsule by mouth daily.      . nitroGLYCERIN (NITROSTAT) 0.4 MG SL tablet Place 0.4 mg under the tongue every 5 (five) minutes as needed. For chest pain      . pantoprazole (PROTONIX) 40 MG tablet Take 1 tablet (40 mg total) by mouth daily.  90 tablet  3  . tapentadol (NUCYNTA) 50 MG TABS Take 50 mg by mouth 3 (three) times daily as needed. For severe pain      .  vitamin B-12 (CYANOCOBALAMIN) 1000 MCG tablet Take 1,000 mcg by mouth daily.      . [DISCONTINUED] pravastatin (PRAVACHOL) 80 MG tablet Take 80 mg by mouth at bedtime.         Review of Systems  Constitutional: Negative.   HENT: Negative.   Eyes: Negative.   Respiratory: Negative.   Cardiovascular: Positive for chest pain.  Gastrointestinal: Negative.   Musculoskeletal: Positive for back pain.  Skin: Negative.   Neurological: Negative.   Hematological: Negative.   Psychiatric/Behavioral: Negative.   All other systems reviewed and are negative.    BP 110/62  Pulse 76  Ht 5\' 2"  (1.575 m)  Wt 152 lb 8 oz (69.174 kg)  BMI 27.89 kg/m2  LMP 06/03/1992  Physical Exam  Nursing note and vitals reviewed. Constitutional: She is  oriented to person, place, and time. She appears well-developed and well-nourished.  HENT:  Head: Normocephalic.  Nose: Nose normal.  Mouth/Throat: Oropharynx is clear and moist.  Eyes: Conjunctivae normal are normal. Pupils are equal, round, and reactive to light.  Neck: Normal range of motion. Neck supple. No JVD present.  Cardiovascular: Normal rate, regular rhythm, S1 normal, S2 normal, normal heart sounds and intact distal pulses.  Exam reveals no gallop and no friction rub.   No murmur heard. Pulmonary/Chest: Effort normal and breath sounds normal. No respiratory distress. She has no wheezes. She has no rales. She exhibits no tenderness.  Abdominal: Soft. Bowel sounds are normal. She exhibits no distension. There is no tenderness.  Musculoskeletal: Normal range of motion. She exhibits no edema and no tenderness.  Lymphadenopathy:    She has no cervical adenopathy.  Neurological: She is alert and oriented to person, place, and time. Coordination normal.  Skin: Skin is warm and dry. No rash noted. No erythema.  Psychiatric: She has a normal mood and affect. Her behavior is normal. Judgment and thought content normal.         Assessment and Plan

## 2012-05-29 NOTE — Telephone Encounter (Signed)
Surgery Consult made and patient aware.

## 2012-06-08 ENCOUNTER — Encounter: Payer: Self-pay | Admitting: Family Medicine

## 2012-06-08 ENCOUNTER — Ambulatory Visit (INDEPENDENT_AMBULATORY_CARE_PROVIDER_SITE_OTHER): Payer: Medicare Other | Admitting: Surgery

## 2012-06-08 ENCOUNTER — Encounter (INDEPENDENT_AMBULATORY_CARE_PROVIDER_SITE_OTHER): Payer: Self-pay | Admitting: Surgery

## 2012-06-08 VITALS — BP 124/76 | HR 71 | Temp 98.6°F | Resp 18 | Ht 62.0 in | Wt 154.0 lb

## 2012-06-08 DIAGNOSIS — K8020 Calculus of gallbladder without cholecystitis without obstruction: Secondary | ICD-10-CM

## 2012-06-08 NOTE — Progress Notes (Signed)
Patient ID: [name], female   DOB: Jan 29, 1935, 78 y.o.   MRN: [id number]  Chief Complaint  Patient presents with  . New Evaluation    G.B    HPI Morgan Hubbard is a 78 y.o. female.  Patient said at request of Dr. Narda Amber for elevated liver function studies and gallstones by ultrasound. The patient has had gallstones for at least 30 years she thinks. She had one episode of chest pain about a month ago which resolved with nitroglycerin. She has had mild elevations in her liver function these for years and actually in her liver biopsy which showed fatty liver. Denies any right upper quadrant pain ,  Nausea or vomiting. HPI  Past Medical History  Diagnosis Date  . Arthritis   . Diabetes mellitus   . Headache   . GERD (gastroesophageal reflux disease)   . Ulcer   . Hyperlipidemia   . Migraine   . Colon polyps   . Thyroid disease   . Shingles   . Urine incontinence   . Goiter past remote    treated with RI  . Other primary cardiomyopathies   . Hyperpotassemia   . Inflammatory and toxic neuropathy, unspecified   . Reflex sympathetic dystrophy, unspecified   . Hypertonicity of bladder   . Hypertension   . Shortness of breath     ambulation  . Migraine     hx of  . Vertigo     hx of  . Overactive bladder     Past Surgical History  Procedure Date  . Breast surgery 19599    breast cyst  . Finger surgery [place]  . Tubal ligation 1976  . Kidney donation 1989    left kidney  . Spine surgery 1999  . Ct scan 3/12    outside hosp- lung nodule and gallstones  . Upper gastrointestinal endoscopy   . Colonoscopy 7/12    normal (hx of polyps in past)   . Back surgery   . Eye surgery     cataract removal in left eye; several eye injections  . Lumbar laminectomy/decompression microdiscectomy 01/31/2012    Procedure: LUMBAR LAMINECTOMY/DECOMPRESSION MICRODISCECTOMY 2 LEVELS;  Surgeon: Eustace Moore, MD;  Location: [facility];  Service: Neurosurgery;  Laterality: N/A;   Thoracic twelve-lumbar one, lumbar one-two laminectomy   . Cardiac catheterization     [place]    Family History  Problem Relation Age of Onset  . Arthritis Mother   . Cancer Mother     uterine and mouth  . Hyperlipidemia Mother   . Stroke Mother   . Hypertension Mother   . Alcohol abuse Father   . Diabetes Father   . Cancer Sister     breast  . Diabetes Sister   . Cancer Brother     lung cancer  . Kidney disease Brother   . Diabetes Brother   . Hyperlipidemia Sister   . Heart disease Sister   . Hypertension Sister   . Diabetes Sister   . Hyperlipidemia Brother   . Kidney failure Brother   . Diabetes Daughter     Social History History  Substance Use Topics  . Smoking status: Never Smoker   . Smokeless tobacco: Never Used  . Alcohol Use: No    Allergies  Allergen Reactions  . Ace Inhibitors Swelling    REACTION: tongue swelling  . Codeine Other (See Comments)    REACTION: nausea  . Lisinopril Swelling    Swelling of  tongue  . Percocet (Oxycodone-Acetaminophen)     Lips swell    Current Outpatient Prescriptions  Medication Sig Dispense Refill  . amLODipine (NORVASC) 5 MG tablet Take 1 tablet (5 mg total) by mouth daily.  90 tablet  3  . aspirin 81 MG chewable tablet 81 mg. Take 81 mg by mouth daily.      Marland Kitchen aspirin 81 MG tablet Take 81 mg by mouth daily.        . carvedilol (COREG) 12.5 MG tablet Take 12.5 mg by mouth 2 (two) times daily with a meal.        . cholecalciferol (VITAMIN D) 1000 UNITS tablet Take 1,000 Units by mouth daily.      Marland Kitchen EPINEPHrine (EPIPEN 2-PAK IJ) Inject as directed as directed.       . fesoterodine (TOVIAZ) 8 MG TB24 Take 8 mg by mouth daily.      Marland Kitchen gabapentin (NEURONTIN) 300 MG capsule Take 1 capsule (300 mg total) by mouth 3 (three) times daily.  270 capsule  3  . glimepiride (AMARYL) 2 MG tablet Take 2 mg by mouth 2 (two) times daily.      Marland Kitchen HYDROcodone-acetaminophen (NORCO/VICODIN) 5-325 MG per tablet Take 1 tablet by mouth  every 6 (six) hours as needed. For pain      . HYDROcodone-acetaminophen (NORCO/VICODIN) 5-325 MG per tablet Take by mouth. Pt unsure of dose      . isosorbide mononitrate (IMDUR) 30 MG 24 hr tablet Take 30 mg by mouth daily.      . isosorbide mononitrate (IMDUR) 30 MG 24 hr tablet 30 mg. Take 30 mg by mouth daily.      . metFORMIN (GLUCOPHAGE) 500 MG tablet 2 tablets. 2 tab by mouth 2 times a day      . metFORMIN (GLUCOPHAGE-XR) 500 MG 24 hr tablet Take 1,000 mg by mouth 2 (two) times daily.      Marland Kitchen MILK THISTLE PO Take 1 capsule by mouth daily.      . nitroGLYCERIN (NITROSTAT) 0.4 MG SL tablet Place 0.4 mg under the tongue every 5 (five) minutes as needed. For chest pain      . nitroGLYCERIN (NITROSTAT) 0.4 MG SL tablet 0.4 mg. Place 0.4 mg under the tongue every 5 (five) minutes as needed. May take up to 3 doses.      . pantoprazole (PROTONIX) 40 MG tablet Take 1 tablet (40 mg total) by mouth daily.  90 tablet  3  . tapentadol (NUCYNTA) 50 MG TABS Take 50 mg by mouth 3 (three) times daily as needed. For severe pain      . vitamin B-12 (CYANOCOBALAMIN) 1000 MCG tablet Take 1,000 mcg by mouth daily.      Marland Kitchen trimethoprim (TRIMPEX) 100 MG tablet 100 mg. Take 100 mg by mouth daily.        Review of Systems Review of Systems  Constitutional: Negative for fever, chills and unexpected weight change.  HENT: Negative for hearing loss, congestion, sore throat, trouble swallowing and voice change.   Eyes: Negative for visual disturbance.  Respiratory: Negative for cough and wheezing.   Cardiovascular: Negative for chest pain, palpitations and leg swelling.  Gastrointestinal: Negative for nausea, vomiting, abdominal pain, diarrhea, constipation, blood in stool, abdominal distention and anal bleeding.  Genitourinary: Negative for hematuria, vaginal bleeding and difficulty urinating.  Musculoskeletal: Negative for arthralgias.  Skin: Negative for rash and wound.  Neurological: Negative for seizures,  syncope and headaches.  Hematological: Negative for adenopathy. Does  not bruise/bleed easily.  Psychiatric/Behavioral: Negative for confusion.    Blood pressure 124/76, pulse 71, temperature 98.6 F (37 C), temperature source Oral, resp. rate 18, height 5\' 2"  (1.575 m), weight 154 lb (69.854 kg), last menstrual period 06/03/1992.  Physical Exam Physical Exam  Constitutional: She is oriented to person, place, and time. She appears well-developed and well-nourished.  HENT:  Head: Normocephalic and atraumatic.  Eyes: EOM are normal. Pupils are equal, round, and reactive to light.  Neck: Normal range of motion. Neck supple.  Cardiovascular: Normal rate and regular rhythm.   Pulmonary/Chest: Effort normal and breath sounds normal.  Abdominal: Soft. Bowel sounds are normal. She exhibits no distension. There is no tenderness.  Musculoskeletal: Normal range of motion.  Neurological: She is alert and oriented to person, place, and time.  Skin: Skin is warm and dry.  Psychiatric: She has a normal mood and affect. Her behavior is normal. Judgment and thought content normal.    Data Reviewed Gallstones by U\S no ductal dilation.   liver function tests slight elevation of ast/alt  Assessment    Gallstones  Mild elevation of LFT Documented history of fatty liver    Plan    Doubt stones are symptomatic but worth following for now.  LFT elevation secondary to fatty liver   Told to call if symptoms worsen.       Jazzie Trampe A. 06/08/2012, 4:14 PM

## 2012-06-08 NOTE — Patient Instructions (Signed)
Cholelithiasis Cholelithiasis (also called gallstones) is a form of gallbladder disease where gallstones form in your gallbladder. The gallbladder is a non-essential organ that stores bile made in the liver, which helps digest fats. Gallstones begin as small crystals and slowly grow into stones. Gallstone pain occurs when the gallbladder spasms, and a gallstone is blocking the duct. Pain can also occur when a stone passes out of the duct.  Women are more likely to develop gallstones than men. Other factors that increase the risk of gallbladder disease are:  Having multiple pregnancies. Physicians sometimes advise removing diseased gallbladders before future pregnancies.  Obesity.  Diets heavy in fried foods and fat.  Increasing age (older than 63).  Prolonged use of medications containing female hormones.  Diabetes mellitus.  Rapid weight loss.  Family history of gallstones (heredity). SYMPTOMS  Feeling sick to your stomach (nauseous).  Abdominal pain.  Yellowing of the skin (jaundice).  Sudden pain. It may persist from several minutes to several hours.  Worsening pain with deep breathing or when jarred.  Fever.  Tenderness to the touch. In some cases, when gallstones do not move into the bile duct, people have no pain or symptoms. These are called "silent" gallstones. TREATMENT In severe cases, emergency surgery may be required. HOME CARE INSTRUCTIONS   Only take over-the-counter or prescription medicines for pain, discomfort, or fever as directed by your caregiver.  Follow a low-fat diet until seen again. Fat causes the gallbladder to contract, which can result in pain.  Follow up as instructed. Attacks are almost always recurrent and surgery is usually required for permanent treatment. SEEK IMMEDIATE MEDICAL CARE IF:   Your pain increases and is not controlled by medications.  You have an oral temperature above 102 F (38.9 C), not controlled by medication.  You  develop nausea and vomiting. MAKE SURE YOU:   Understand these instructions.  Will watch your condition.  Will get help right away if you are not doing well or get worse. Document Released: 05/16/2005 Document Revised: 08/12/2011 Document Reviewed: 07/19/2010 [facility] Patient Information 2013 [place].

## 2012-06-15 ENCOUNTER — Encounter: Payer: Self-pay | Admitting: Family Medicine

## 2012-06-16 ENCOUNTER — Ambulatory Visit (INDEPENDENT_AMBULATORY_CARE_PROVIDER_SITE_OTHER): Payer: Medicare Other | Admitting: Family Medicine

## 2012-06-16 ENCOUNTER — Encounter: Payer: Self-pay | Admitting: Family Medicine

## 2012-06-16 VITALS — BP 130/58 | HR 89 | Temp 97.3°F | Ht 62.0 in | Wt 154.0 lb

## 2012-06-16 DIAGNOSIS — H6991 Unspecified Eustachian tube disorder, right ear: Secondary | ICD-10-CM | POA: Insufficient documentation

## 2012-06-16 DIAGNOSIS — H6120 Impacted cerumen, unspecified ear: Secondary | ICD-10-CM | POA: Insufficient documentation

## 2012-06-16 DIAGNOSIS — H6980 Other specified disorders of Eustachian tube, unspecified ear: Secondary | ICD-10-CM | POA: Insufficient documentation

## 2012-06-16 MED ORDER — FLUTICASONE PROPIONATE 50 MCG/ACT NA SUSP: 2.0000 | Freq: Every day | NASAL | Status: DC

## 2012-06-16 NOTE — Assessment & Plan Note (Signed)
bilat but worse on L  Adv to use debrox otc twice weekly Then f/u 2-3 wk for irrigation unless she has success with this at home

## 2012-06-16 NOTE — Assessment & Plan Note (Addendum)
Symptomatically worse on the R Trial of flonase 2-3 weeks Will update if worse or no imp

## 2012-06-16 NOTE — Progress Notes (Signed)
Subjective:    Patient ID: Morgan Hubbard, female    DOB: Jul 05, 1934, 78 y.o.   MRN: [id number]  HPI Here with an ear problem  This is recurrent  She hears a crackling and popping - in her R ear and feels full (like it is full of water)   No pain  Thinks the sound if muffled on that side -- her husband noticed that  No pain to open or close jaw  No drainage   No nasal cong or ST She did have a sore in her L nostril that is now much better- almost gone   She does have issues with ear wax   Patient Active Problem List  Diagnosis  . DM  . HYPERCHOLESTEROLEMIA  . HYPERKALEMIA  . REFLEX SYMPATHETIC DYSTROPHY  . POLYNEUROPATHY  . HYPERTENSION  . CARDIOMYOPATHY  . OVERACTIVE BLADDER  . Fatty liver  . Tongue swelling  . Goiter  . Hematuria  . Left shoulder pain  . Renal insufficiency  . Spinal stenosis of lumbar region  . Mixed incontinence urge and stress  . Urticaria  . Right hip pain  . Atypical chest pain  . Elevated transaminase level  . Gallstones   Past Medical History  Diagnosis Date  . Arthritis   . Diabetes mellitus   . Headache   . GERD (gastroesophageal reflux disease)   . Ulcer   . Hyperlipidemia   . Migraine   . Colon polyps   . Thyroid disease   . Shingles   . Urine incontinence   . Goiter past remote    treated with RI  . Other primary cardiomyopathies   . Hyperpotassemia   . Inflammatory and toxic neuropathy, unspecified   . Reflex sympathetic dystrophy, unspecified   . Hypertonicity of bladder   . Hypertension   . Shortness of breath     ambulation  . Migraine     hx of  . Vertigo     hx of  . Overactive bladder    Past Surgical History  Procedure Date  . Breast surgery 19599    breast cyst  . Finger surgery [place]  . Tubal ligation 1976  . Kidney donation 1989    left kidney  . Spine surgery 1999  . Ct scan 3/12    outside hosp- lung nodule and gallstones  . Upper gastrointestinal endoscopy   . Colonoscopy 7/12   normal (hx of polyps in past)   . Back surgery   . Eye surgery     cataract removal in left eye; several eye injections  . Lumbar laminectomy/decompression microdiscectomy 01/31/2012    Procedure: LUMBAR LAMINECTOMY/DECOMPRESSION MICRODISCECTOMY 2 LEVELS;  Surgeon: Eustace Moore, MD;  Location: [facility];  Service: Neurosurgery;  Laterality: N/A;  Thoracic twelve-lumbar one, lumbar one-two laminectomy   . Cardiac catheterization     [place]   History  Substance Use Topics  . Smoking status: Never Smoker   . Smokeless tobacco: Never Used  . Alcohol Use: No   Family History  Problem Relation Age of Onset  . Arthritis Mother   . Cancer Mother     uterine and mouth  . Hyperlipidemia Mother   . Stroke Mother   . Hypertension Mother   . Alcohol abuse Father   . Diabetes Father   . Cancer Sister     breast  . Diabetes Sister   . Cancer Brother     lung cancer  . Kidney disease Brother   .  Diabetes Brother   . Hyperlipidemia Sister   . Heart disease Sister   . Hypertension Sister   . Diabetes Sister   . Hyperlipidemia Brother   . Kidney failure Brother   . Diabetes Daughter    Allergies  Allergen Reactions  . Ace Inhibitors Swelling    REACTION: tongue swelling  . Codeine Other (See Comments)    REACTION: nausea  . Lisinopril Swelling    Swelling of tongue  . Percocet (Oxycodone-Acetaminophen)     Lips swell   Current Outpatient Prescriptions on File Prior to Visit  Medication Sig Dispense Refill  . amLODipine (NORVASC) 5 MG tablet Take 1 tablet (5 mg total) by mouth daily.  90 tablet  3  . aspirin 81 MG tablet Take 81 mg by mouth daily.        . carvedilol (COREG) 12.5 MG tablet Take 12.5 mg by mouth 2 (two) times daily with a meal.        . cholecalciferol (VITAMIN D) 1000 UNITS tablet Take 1,000 Units by mouth daily.      Marland Kitchen EPINEPHrine (EPIPEN 2-PAK IJ) Inject as directed as directed.       . fesoterodine (TOVIAZ) 8 MG TB24 Take 8 mg by mouth daily.      Marland Kitchen  gabapentin (NEURONTIN) 300 MG capsule Take 1 capsule (300 mg total) by mouth 3 (three) times daily.  270 capsule  3  . glimepiride (AMARYL) 2 MG tablet Take 2 mg by mouth 2 (two) times daily.      Marland Kitchen HYDROcodone-acetaminophen (NORCO/VICODIN) 5-325 MG per tablet Take 1 tablet by mouth every 6 (six) hours as needed. For pain      . isosorbide mononitrate (IMDUR) 30 MG 24 hr tablet Take 30 mg by mouth daily.      . metFORMIN (GLUCOPHAGE-XR) 500 MG 24 hr tablet Take 1,000 mg by mouth 2 (two) times daily.      Marland Kitchen MILK THISTLE PO Take 1 capsule by mouth daily.      . nitroGLYCERIN (NITROSTAT) 0.4 MG SL tablet Place 0.4 mg under the tongue every 5 (five) minutes as needed. For chest pain      . pantoprazole (PROTONIX) 40 MG tablet Take 1 tablet (40 mg total) by mouth daily.  90 tablet  3  . tapentadol (NUCYNTA) 50 MG TABS Take 50 mg by mouth 3 (three) times daily as needed. For severe pain      . trimethoprim (TRIMPEX) 100 MG tablet 100 mg. Take 100 mg by mouth daily.      . vitamin B-12 (CYANOCOBALAMIN) 1000 MCG tablet Take 1,000 mcg by mouth daily.          Review of Systems Review of Systems  Constitutional: Negative for fever, appetite change, fatigue and unexpected weight change.  Eyes: Negative for pain and visual disturbance.  ENT pos for muffled hearing and ear fullness, neg for ear dc or pain, neg for nasal cong Respiratory: Negative for cough and shortness of breath.   Cardiovascular: Negative for cp or palpitations    Gastrointestinal: Negative for nausea, diarrhea and constipation.  Genitourinary: Negative for urgency and frequency.  Skin: Negative for pallor or rash   Neurological: Negative for weakness, light-headedness, numbness and headaches.  Hematological: Negative for adenopathy. Does not bruise/bleed easily.  Psychiatric/Behavioral: Negative for dysphoric mood. The patient is not nervous/anxious.         Objective:   Physical Exam  Constitutional: She appears well-developed  and well-nourished. No distress.  HENT:  Head: Normocephalic and atraumatic.  Nose: Nose normal.  Mouth/Throat: Oropharynx is clear and moist. No oropharyngeal exudate.       R ear- partial cerumen impaction/ TM is visible and dull  L ear - total cerumen impaction No external ear tenderness or drainage noted Nares clear No sinus tenderness   Eyes: Conjunctivae normal and EOM are normal. Pupils are equal, round, and reactive to light. Right eye exhibits no discharge. Left eye exhibits no discharge.  Neck: Normal range of motion. Neck supple.  Cardiovascular: Normal rate and regular rhythm.   Pulmonary/Chest: Effort normal and breath sounds normal. No respiratory distress. She has no wheezes. She has no rales.  Lymphadenopathy:    She has no cervical adenopathy.  Neurological: She is alert. She has normal reflexes. No cranial nerve deficit. Coordination normal.  Skin: Skin is warm and dry. No rash noted. No erythema.  Psychiatric: She has a normal mood and affect.          Assessment & Plan:

## 2012-06-16 NOTE — Patient Instructions (Addendum)
For eustacian tube dysfunction- start flonase nasal spray as directed once daily  For ear wax buy debrox solution over the counter and use it as directed in both ears twice weekly for 2-3 weeks Follow up with [place] in 2-3 weeks to flush her ears (unless you have success doing it at home)  Update me if worse or no improvement

## 2012-06-25 ENCOUNTER — Other Ambulatory Visit: Payer: Self-pay | Admitting: *Deleted

## 2012-06-25 MED ORDER — AMLODIPINE BESYLATE 5 MG PO TABS: 5.0000 mg | ORAL_TABLET | Freq: Every day | ORAL | Status: DC

## 2012-06-25 MED ORDER — GABAPENTIN 300 MG PO CAPS: 300.0000 mg | ORAL_CAPSULE | Freq: Three times a day (TID) | ORAL | Status: DC

## 2012-06-25 MED ORDER — PANTOPRAZOLE SODIUM 40 MG PO TBEC: 40.0000 mg | DELAYED_RELEASE_TABLET | Freq: Every day | ORAL | Status: DC

## 2012-06-25 MED ORDER — GLIMEPIRIDE 2 MG PO TABS: 2.0000 mg | ORAL_TABLET | Freq: Two times a day (BID) | ORAL | Status: DC

## 2012-07-07 ENCOUNTER — Ambulatory Visit: Payer: Medicare Other | Admitting: Family Medicine

## 2012-07-21 ENCOUNTER — Ambulatory Visit: Payer: Self-pay

## 2012-07-31 ENCOUNTER — Ambulatory Visit: Payer: Medicare Other | Admitting: Cardiovascular Disease

## 2012-08-01 ENCOUNTER — Ambulatory Visit: Payer: Self-pay

## 2012-08-24 ENCOUNTER — Ambulatory Visit: Payer: Medicare Other | Admitting: Cardiovascular Disease

## 2012-09-01 ENCOUNTER — Ambulatory Visit: Payer: Self-pay

## 2012-09-03 ENCOUNTER — Ambulatory Visit (INDEPENDENT_AMBULATORY_CARE_PROVIDER_SITE_OTHER): Payer: Medicare Other | Admitting: Cardiovascular Disease

## 2012-09-03 ENCOUNTER — Encounter: Payer: Self-pay | Admitting: Cardiovascular Disease

## 2012-09-03 VITALS — BP 122/60 | HR 70 | Ht 62.0 in | Wt 153.0 lb

## 2012-09-03 DIAGNOSIS — I10 Essential (primary) hypertension: Secondary | ICD-10-CM

## 2012-09-03 DIAGNOSIS — I428 Other cardiomyopathies: Secondary | ICD-10-CM

## 2012-09-03 DIAGNOSIS — M48061 Spinal stenosis, lumbar region without neurogenic claudication: Secondary | ICD-10-CM

## 2012-09-03 DIAGNOSIS — E7800 Pure hypercholesterolemia, unspecified: Secondary | ICD-10-CM

## 2012-09-03 DIAGNOSIS — R0602 Shortness of breath: Secondary | ICD-10-CM | POA: Insufficient documentation

## 2012-09-03 DIAGNOSIS — I420 Dilated cardiomyopathy: Secondary | ICD-10-CM

## 2012-09-03 NOTE — Assessment & Plan Note (Signed)
No clinical signs of heart failure. She is concerned that her ejection fraction may have decreased. She is scheduled to see outside cardiologist at [facility] ([place] in several weeks. They might do an echocardiogram. I suspect her symptoms are secondary to deconditioning and inactivity, possible contribution from her severe back pain. No changes made to her medications. She does not want to take Lasix. We'll check a BNP.

## 2012-09-03 NOTE — Progress Notes (Signed)
Patient ID: Morgan Hubbard, female    DOB: 05/07/1935, 78 y.o.   MRN: [id number]  HPI Comments: Morgan Hubbard is a very pleasant 78 year old woman with past medical history of nonischemic cardiomyopathy, prior ejection fraction 30% and has improved on recent outside echocardiogram to 45%, old left bundle branch block, nephrectomy/donated kidney in 1989, possible angioedema on lisinopril, back surgery x3, poorly controlled diabetes who presents for worsening shortness of breath.  She states that over the past several months, she has had worsening fatigue, shortness of breath with exertion. Has rare edema in her lower extremities. None on today's visit. She has difficulty walking from one end of the house to the other. She does not think it his conditioning. She reports that she is not very active at baseline. She does have severe back pain, mild kyphosis and wonders if her symptoms could be secondary to her back. She has not been taking Lasix as she has overactive bladder.  She reports cardiac catheterization 08/30/2010 in [place] that showed no significant coronary artery disease Last echocardiogram at outside facility showed ejection fraction 45% Hemoglobin A1c 11.6 Recent climb in her LFTs, pravastatin recently held  She does report her sister has history of pancreatic cancer previous ultrasound showing gallstones and fatty liver  EKG shows normal sinus rhythm with rate 70 beats per minute with left bundle branch block     Outpatient Encounter Prescriptions as of 09/03/2012  Medication Sig Dispense Refill  . amLODipine (NORVASC) 5 MG tablet Take 1 tablet (5 mg total) by mouth daily.  90 tablet  1  . aspirin 81 MG tablet Take 81 mg by mouth daily.        . carvedilol (COREG) 12.5 MG tablet Take 12.5 mg by mouth 2 (two) times daily with a meal.        . cholecalciferol (VITAMIN D) 1000 UNITS tablet Take 1,000 Units by mouth daily.      Marland Kitchen EPINEPHrine (EPIPEN 2-PAK IJ) Inject as directed as  directed.       . fluticasone (FLONASE) 50 MCG/ACT nasal spray Place 2 sprays into the nose daily.  16 g  6  . furosemide (LASIX) 20 MG tablet Take 20 mg by mouth as needed.      . gabapentin (NEURONTIN) 300 MG capsule Take 1 capsule (300 mg total) by mouth 3 (three) times daily.  270 capsule  1  . glimepiride (AMARYL) 2 MG tablet Take 1 tablet (2 mg total) by mouth 2 (two) times daily.  180 tablet  1  . HYDROcodone-acetaminophen (NORCO/VICODIN) 5-325 MG per tablet Take 1 tablet by mouth every 6 (six) hours as needed. For pain      . isosorbide mononitrate (IMDUR) 30 MG 24 hr tablet Take 30 mg by mouth daily.      . metFORMIN (GLUCOPHAGE-XR) 500 MG 24 hr tablet Take 1,000 mg by mouth 2 (two) times daily.      Marland Kitchen MILK THISTLE PO Take 1 capsule by mouth daily.      . mirabegron ER ([name]) 50 MG TB24 Take by mouth daily.      . nitroGLYCERIN (NITROSTAT) 0.4 MG SL tablet Place 0.4 mg under the tongue every 5 (five) minutes as needed. For chest pain      . pantoprazole (PROTONIX) 40 MG tablet Take 1 tablet (40 mg total) by mouth daily.  90 tablet  1  . tapentadol (NUCYNTA) 50 MG TABS Take 50 mg by mouth 3 (three) times daily as needed. For  severe pain      . vitamin B-12 (CYANOCOBALAMIN) 1000 MCG tablet Take 1,000 mcg by mouth daily.       Review of Systems  Constitutional: Negative.   HENT: Negative.   Eyes: Negative.   Respiratory: Negative.   Cardiovascular: Positive for chest pain.  Gastrointestinal: Negative.   Musculoskeletal: Positive for back pain.  Skin: Negative.   Neurological: Negative.   Psychiatric/Behavioral: Negative.   All other systems reviewed and are negative.    BP 122/60  Pulse 70  Ht 5\' 2"  (1.575 m)  Wt 153 lb (69.4 kg)  BMI 27.98 kg/m2  LMP 06/03/1992  Physical Exam  Nursing note and vitals reviewed. Constitutional: She is oriented to person, place, and time. She appears well-developed and well-nourished.  HENT:  Head: Normocephalic.  Nose: Nose normal.   Mouth/Throat: Oropharynx is clear and moist.  Eyes: Conjunctivae are normal. Pupils are equal, round, and reactive to light.  Neck: Normal range of motion. Neck supple. No JVD present.  Cardiovascular: Normal rate, regular rhythm, S1 normal, S2 normal, normal heart sounds and intact distal pulses.  Exam reveals no gallop and no friction rub.   No murmur heard. Pulmonary/Chest: Effort normal and breath sounds normal. No respiratory distress. She has no wheezes. She has no rales. She exhibits no tenderness.  Abdominal: Soft. Bowel sounds are normal. She exhibits no distension. There is no tenderness.  Musculoskeletal: Normal range of motion. She exhibits no edema and no tenderness.  Lymphadenopathy:    She has no cervical adenopathy.  Neurological: She is alert and oriented to person, place, and time. Coordination normal.  Skin: Skin is warm and dry. No rash noted. No erythema.  Psychiatric: She has a normal mood and affect. Her behavior is normal. Judgment and thought content normal.    Assessment and Plan

## 2012-09-03 NOTE — Assessment & Plan Note (Signed)
Blood pressure is well controlled on today's visit. No changes made to the medications. 

## 2012-09-03 NOTE — Patient Instructions (Addendum)
You are doing well. No medication changes were made.  We will draw a BNP today  Please call us if you have new issues that need to be addressed before your next appt.  Your physician wants you to follow-up in: 6 months.  You will receive a reminder letter in the mail two months in advance. If you don't receive a letter, please call our office to schedule the follow-up appointment.

## 2012-09-03 NOTE — Assessment & Plan Note (Addendum)
She reports that her pain medication is not working well. She is curious about the Lidoderm patch. We have asked her to discuss this with Dr. [name] to see if this might be of benefit in symptom relief.

## 2012-09-03 NOTE — Assessment & Plan Note (Signed)
Cholesterol medication does not appear to be on her list today.

## 2012-09-03 NOTE — Assessment & Plan Note (Signed)
Prior echocardiogram showing improved ejection fraction. No clinical signs of worsening heart failure. She's not take Lasix

## 2012-10-14 ENCOUNTER — Other Ambulatory Visit: Payer: Self-pay | Admitting: Family Medicine

## 2012-12-16 ENCOUNTER — Other Ambulatory Visit: Payer: Self-pay | Admitting: *Deleted

## 2012-12-16 NOTE — Telephone Encounter (Signed)
Please schedule f/u with me in 6 mo and refill until then, thanks

## 2012-12-17 MED ORDER — GABAPENTIN 300 MG PO CAPS: 300.0000 mg | ORAL_CAPSULE | Freq: Three times a day (TID) | ORAL | Status: DC

## 2012-12-17 NOTE — Telephone Encounter (Signed)
appt scheduled and meds refilled until then 

## 2012-12-22 ENCOUNTER — Other Ambulatory Visit: Payer: Self-pay | Admitting: *Deleted

## 2012-12-22 MED ORDER — PANTOPRAZOLE SODIUM 40 MG PO TBEC: 40.0000 mg | DELAYED_RELEASE_TABLET | Freq: Every day | ORAL | Status: DC

## 2012-12-22 MED ORDER — AMLODIPINE BESYLATE 5 MG PO TABS: 5.0000 mg | ORAL_TABLET | Freq: Every day | ORAL | Status: DC

## 2012-12-22 MED ORDER — GLIMEPIRIDE 2 MG PO TABS: 2.0000 mg | ORAL_TABLET | Freq: Two times a day (BID) | ORAL | Status: DC

## 2012-12-31 ENCOUNTER — Other Ambulatory Visit: Payer: Self-pay | Admitting: *Deleted

## 2012-12-31 NOTE — Telephone Encounter (Signed)
Please forward to her endocrinologist, thanks

## 2012-12-31 NOTE — Telephone Encounter (Signed)
Received faxed request to refill metformin. Do you want to refill or should it be forwarded to her endocrinologist? Per last a1c pt was to f/u up with that Dr in Feb 14.

## 2013-01-01 NOTE — Telephone Encounter (Signed)
No documentation in chart to show who Endocrinologist is.  Left vm for pt to call back to find out who is following her.

## 2013-01-05 NOTE — Telephone Encounter (Signed)
Pt returned our call and she is going to Dr. Gabriel Carina at [facility], I advise pt I would call their office to see if they could start refilling her metformin. I called and left a message with Dr. Joycie Peek assistant, and requested her to start refilling her metformin, assistant said she would relay the message and have Dr. Gabriel Carina refill med and if that's a problem she will let pt know

## 2013-02-15 ENCOUNTER — Telehealth (INDEPENDENT_AMBULATORY_CARE_PROVIDER_SITE_OTHER): Payer: Medicare Other

## 2013-02-15 DIAGNOSIS — E875 Hyperkalemia: Secondary | ICD-10-CM

## 2013-02-15 NOTE — Telephone Encounter (Signed)
Pt left v/m; pt was seen by Dr Leonides Schanz at [facility] 1 week ago and K was slightly elevated; Dr Leonides Schanz was to send lab results to Dr [name] and Dr Leonides Schanz was going to request Dr [name] to repeat K lab test. Pt wants status of scheduling lab test. Pt will be in town until [facility] 02/17/13 and returning home next week. Pt said K was slightly elevated but since pt has one kidney and heart issues thought important to recheck labs. Pt request cb.

## 2013-02-15 NOTE — Telephone Encounter (Signed)
Please schedule lab draw when able for K for dx of hyperkalemia (non fasting) thanks

## 2013-02-16 ENCOUNTER — Other Ambulatory Visit (INDEPENDENT_AMBULATORY_CARE_PROVIDER_SITE_OTHER): Payer: Medicare Other

## 2013-02-16 DIAGNOSIS — E875 Hyperkalemia: Secondary | ICD-10-CM

## 2013-02-16 LAB — COMPREHENSIVE METABOLIC PANEL
ALT: 20 U/L (ref 0–35)
AST: 25 U/L (ref 0–37)
Albumin: 3.7 g/dL (ref 3.5–5.2)
Alkaline Phosphatase: 74 U/L (ref 39–117)
Chloride: 103 mEq/L (ref 96–112)
Glucose, Bld: 215 mg/dL — ABNORMAL HIGH (ref 70–99)
Potassium: 5.2 mEq/L — ABNORMAL HIGH (ref 3.5–5.1)
Sodium: 141 mEq/L (ref 135–145)
Total Bilirubin: 0.6 mg/dL (ref 0.3–1.2)
Total Protein: 6.5 g/dL (ref 6.0–8.3)

## 2013-02-16 NOTE — Telephone Encounter (Signed)
Pt scheduled for lab appt today

## 2013-03-02 ENCOUNTER — Encounter: Payer: Self-pay | Admitting: Family Medicine

## 2013-03-02 ENCOUNTER — Ambulatory Visit (INDEPENDENT_AMBULATORY_CARE_PROVIDER_SITE_OTHER): Payer: Medicare Other | Admitting: Family Medicine

## 2013-03-02 VITALS — BP 130/80 | HR 78 | Temp 97.8°F | Ht 62.0 in | Wt 156.0 lb

## 2013-03-02 DIAGNOSIS — E875 Hyperkalemia: Secondary | ICD-10-CM

## 2013-03-02 DIAGNOSIS — N289 Disorder of kidney and ureter, unspecified: Secondary | ICD-10-CM

## 2013-03-02 DIAGNOSIS — I10 Essential (primary) hypertension: Secondary | ICD-10-CM

## 2013-03-02 LAB — COMPREHENSIVE METABOLIC PANEL
Albumin: 3.3 g/dL — ABNORMAL LOW (ref 3.5–5.2)
Alkaline Phosphatase: 140 U/L — ABNORMAL HIGH (ref 39–117)
BUN: 15 mg/dL (ref 6–23)
CO2: 31 mEq/L (ref 19–32)
Calcium: 9.2 mg/dL (ref 8.4–10.5)
Creatinine, Ser: 0.9 mg/dL (ref 0.4–1.2)
GFR: 65.5 mL/min (ref >60.00)
Glucose, Bld: 156 mg/dL — ABNORMAL HIGH (ref 70–99)
Potassium: 4.1 mEq/L (ref 3.5–5.1)
Sodium: 138 mEq/L (ref 135–145)
Total Bilirubin: 0.9 mg/dL (ref 0.3–1.2)
Total Protein: 6.7 g/dL (ref 6.0–8.3)

## 2013-03-02 LAB — LIPID PANEL
Cholesterol: 239 mg/dL — ABNORMAL HIGH (ref 0–200)
Total CHOL/HDL Ratio: 7
Triglycerides: 297 mg/dL — ABNORMAL HIGH (ref 0.0–149.0)

## 2013-03-02 NOTE — Assessment & Plan Note (Signed)
bp in fair control at this time  No changes needed  Disc lifstyle change with low sodium diet and exercise  Lab today  Still has mildly high K- watching

## 2013-03-02 NOTE — Progress Notes (Signed)
Subjective:    Patient ID: Morgan Hubbard, female    DOB: 1935/04/08, 78 y.o.   MRN: [id number]  HPI Here for f/u of chronic health problems   Wt is up 3 lb with bmi of 28  Having hip pain appt with Dr Lorelei Pont for inj tomorrow- really giving her a fit - has been in bed with it  Took left over med from prev back surgery - nucynta  Has vicodin at home     Chemistry      Component Value Date/Time   NA 141 02/16/2013 1334   K 5.2* 02/16/2013 1334   CL 103 02/16/2013 1334   CO2 34* 02/16/2013 1334   BUN 14 02/16/2013 1334   CREATININE 1.0 02/16/2013 1334      Component Value Date/Time   CALCIUM 9.3 02/16/2013 1334   ALKPHOS 74 02/16/2013 1334   AST 25 02/16/2013 1334   ALT 20 02/16/2013 1334   BILITOT 0.6 02/16/2013 1334      kayexelate- has not taken lately  Hx of high K - tends to be around 5.3   Her LFTs are better - she has been taking milk thistle   Needs her chol checked Lab Results  Component Value Date   CHOL 179 10/15/2010   HDL 44.90 10/15/2010   LDLCALC 105* 10/15/2010   TRIG 148.0 10/15/2010   CHOLHDL 4 10/15/2010   she avoids high fat foods overall   Patient Active Problem List   Diagnosis Date Noted  . Shortness of breath 09/03/2012  . ETD (eustachian tube dysfunction) 06/16/2012  . Cerumen impaction 06/16/2012  . Gallstones 05/26/2012  . Elevated transaminase level 05/18/2012  . Spinal stenosis of lumbar region 12/10/2011  . Mixed incontinence urge and stress 12/10/2011  . Renal insufficiency 11/04/2011  . Goiter 01/14/2011  . Tongue swelling 12/01/2010  . Fatty liver 08/28/2010  . DM 07/09/2010  . HYPERCHOLESTEROLEMIA 07/09/2010  . HYPERKALEMIA 07/09/2010  . REFLEX SYMPATHETIC DYSTROPHY 07/09/2010  . POLYNEUROPATHY 07/09/2010  . HYPERTENSION 07/09/2010  . CARDIOMYOPATHY 07/09/2010  . OVERACTIVE BLADDER 07/09/2010   Past Medical History  Diagnosis Date  . Arthritis   . Diabetes mellitus   . Headache(784.0)   . GERD (gastroesophageal reflux disease)    . Ulcer   . Hyperlipidemia   . Migraine   . Colon polyps   . Thyroid disease   . Shingles   . Urine incontinence   . Goiter past remote    treated with RI  . Other primary cardiomyopathies   . Hyperpotassemia   . Inflammatory and toxic neuropathy, unspecified   . Reflex sympathetic dystrophy, unspecified   . Hypertonicity of bladder   . Hypertension   . Shortness of breath     ambulation  . Migraine     hx of  . Vertigo     hx of  . Overactive bladder    Past Surgical History  Procedure Laterality Date  . Breast surgery  19599    breast cyst  . Finger surgery  [place]  . Tubal ligation  1976  . Kidney donation  1989    left kidney  . Spine surgery  1999  . Ct scan  3/12    outside hosp- lung nodule and gallstones  . Upper gastrointestinal endoscopy    . Colonoscopy  7/12    normal (hx of polyps in past)   . Back surgery    . Eye surgery      cataract removal in  left eye; several eye injections  . Lumbar laminectomy/decompression microdiscectomy  01/31/2012    Procedure: LUMBAR LAMINECTOMY/DECOMPRESSION MICRODISCECTOMY 2 LEVELS;  Surgeon: Eustace Moore, MD;  Location: [place] [facility];  Service: Neurosurgery;  Laterality: N/A;  Thoracic twelve-lumbar one, lumbar one-two laminectomy   . Cardiac catheterization      [place]   History  Substance Use Topics  . Smoking status: Never Smoker   . Smokeless tobacco: Never Used  . Alcohol Use: No   Family History  Problem Relation Age of Onset  . Arthritis Mother   . Cancer Mother     uterine and mouth  . Hyperlipidemia Mother   . Stroke Mother   . Hypertension Mother   . Alcohol abuse Father   . Diabetes Father   . Cancer Sister     breast  . Diabetes Sister   . Cancer Brother     lung cancer  . Kidney disease Brother   . Diabetes Brother   . Hyperlipidemia Sister   . Heart disease Sister   . Hypertension Sister   . Diabetes Sister   . Hyperlipidemia Brother   . Kidney failure Brother   . Diabetes  Daughter    Allergies  Allergen Reactions  . Ace Inhibitors Swelling    REACTION: tongue swelling  . Codeine Other (See Comments)    REACTION: nausea  . Lisinopril Swelling    Swelling of tongue  . Nsaids   . Percocet [Oxycodone-Acetaminophen]     Lips swell  . Tylenol [Acetaminophen]    Current Outpatient Prescriptions on File Prior to Visit  Medication Sig Dispense Refill  . aspirin 81 MG tablet Take 81 mg by mouth daily.        . carvedilol (COREG) 12.5 MG tablet Take 12.5 mg by mouth 2 (two) times daily with a meal.        . cholecalciferol (VITAMIN D) 1000 UNITS tablet Take 1,000 Units by mouth daily.      Marland Kitchen EPINEPHrine (EPIPEN 2-PAK IJ) Inject as directed as directed.       . furosemide (LASIX) 20 MG tablet Take 20 mg by mouth as needed.      . gabapentin (NEURONTIN) 300 MG capsule Take 1 capsule (300 mg total) by mouth 3 (three) times daily.  270 capsule  1  . glimepiride (AMARYL) 2 MG tablet Take 1 tablet (2 mg total) by mouth 2 (two) times daily.  180 tablet  1  . HYDROcodone-acetaminophen (NORCO/VICODIN) 5-325 MG per tablet Take 1 tablet by mouth every 6 (six) hours as needed. For pain      . isosorbide mononitrate (IMDUR) 30 MG 24 hr tablet Take 30 mg by mouth daily.      . metFORMIN (GLUCOPHAGE-XR) 500 MG 24 hr tablet TAKE TWO TABLETS BY MOUTH TWICE DAILY  360 tablet  0  . MILK THISTLE PO Take 1 capsule by mouth daily.      . mirabegron ER ([name]) 50 MG TB24 Take by mouth daily.      . nitroGLYCERIN (NITROSTAT) 0.4 MG SL tablet Place 0.4 mg under the tongue every 5 (five) minutes as needed. For chest pain      . pantoprazole (PROTONIX) 40 MG tablet Take 1 tablet (40 mg total) by mouth daily.  90 tablet  1  . tapentadol (NUCYNTA) 50 MG TABS Take 50 mg by mouth 3 (three) times daily as needed. For severe pain      . vitamin B-12 (CYANOCOBALAMIN) 1000 MCG tablet Take  1,000 mcg by mouth daily.       No current facility-administered medications on file prior to visit.     Review of Systems Review of Systems  Constitutional: Negative for fever, appetite change, fatigue and unexpected weight change.  Eyes: Negative for pain and visual disturbance.  Respiratory: Negative for cough and shortness of breath.   Cardiovascular: Negative for cp or palpitations    Gastrointestinal: Negative for nausea, diarrhea and constipation.  Genitourinary: Negative for urgency and frequency.  Skin: Negative for pallor or rash   MSK pos for back pain and hip pain  Neurological: Negative for weakness, light-headedness, numbness and headaches.  Hematological: Negative for adenopathy. Does not bruise/bleed easily.  Psychiatric/Behavioral: Negative for dysphoric mood. The patient is not nervous/anxious.         Objective:   Physical Exam  Constitutional: She appears well-developed and well-nourished. No distress.  overwt and well appearing   HENT:  Head: Normocephalic and atraumatic.  Mouth/Throat: Oropharynx is clear and moist.  Eyes: Conjunctivae and EOM are normal. Pupils are equal, round, and reactive to light.  Neck: Normal range of motion. Neck supple. No JVD present. Carotid bruit is not present.  Cardiovascular: Normal rate, regular rhythm, normal heart sounds and intact distal pulses.  Exam reveals no gallop.   Pulmonary/Chest: Effort normal and breath sounds normal. No respiratory distress. She has no wheezes. She has no rales.  Abdominal: Soft. Bowel sounds are normal. She exhibits no distension and no mass. There is no tenderness.  Musculoskeletal: She exhibits no edema and no tenderness.  Lymphadenopathy:    She has no cervical adenopathy.  Neurological: She is alert. She has normal reflexes. No cranial nerve deficit. She exhibits normal muscle tone. Coordination normal.  Skin: Skin is warm and dry. No rash noted. No erythema. No pallor.  Psychiatric: She has a normal mood and affect.          Assessment & Plan:

## 2013-03-02 NOTE — Patient Instructions (Addendum)
Labs today  Do not take any vitamins with potassium  Make sure to drink enough water  Stay as active as you can  Try hard to stick with a diabetic diet   Make sure to get a flu shot this season

## 2013-03-02 NOTE — Assessment & Plan Note (Signed)
Continue to watch Tends to run mildly high K

## 2013-03-02 NOTE — Assessment & Plan Note (Signed)
Lab today  Last K 5.2  Has had to take kayexelate in the past Hx of mild renal insuff

## 2013-03-03 ENCOUNTER — Ambulatory Visit (INDEPENDENT_AMBULATORY_CARE_PROVIDER_SITE_OTHER): Payer: Medicare Other | Admitting: Family Medicine

## 2013-03-03 ENCOUNTER — Encounter: Payer: Self-pay | Admitting: Family Medicine

## 2013-03-03 VITALS — BP 130/78 | HR 74 | Temp 97.5°F | Wt 156.0 lb

## 2013-03-03 DIAGNOSIS — M76899 Other specified enthesopathies of unspecified lower limb, excluding foot: Secondary | ICD-10-CM

## 2013-03-03 DIAGNOSIS — M7061 Trochanteric bursitis, right hip: Secondary | ICD-10-CM

## 2013-03-03 NOTE — Progress Notes (Signed)
Therapist, music at [facility] [place] [address] Phone: [phone] Fax: [phone]  Date:  03/03/2013   Name:  Morgan Hubbard   DOB:  08-Aug-1934   MRN:  [id number] Gender: female Age: 78 y.o.  Primary Physician:  [name], MD  Evaluating MD: [name], MD   Chief Complaint: Hip Injection   History of Present Illness:  Morgan Hubbard is a 78 y.o. pleasant patient who presents with the following:  Outer lateral R hip is hurting a lot.  She is also having some right posterior buttocks pain a little bit of distal lower extremity pain. She has not had any trauma that she knows of. She denies any groin pain. Primarily, she is having some lateral hip pain, and has required some Vicodin as well as a Nucynta.  She has a trochanteric bursitis on the right one year ago, and it responded well to an injection.  CT of the abdomen reviewed, bone windows reviewed, and scalp windows, AP pelvis reviewed. There is a mild degree of osteoarthritic change on the left and a mild, slightly more osteoarthritic degree of changes on the right.  05/2012 OV: 2 level decompression laminectomy almost 2 months ago, and then subsequently went to [place] about 5 weeks after her surgery. She did fine in [place], and used her walker. After coming on, over the last week she is developed right-sided lateral hip pain. No pain in the buttocks. The back pain. No radiculopathy, numbness, or tingling.  Flew in [place] - 5 weeks after back surgery, 5 weeks after back surgery from Dr. Ronnald Ramp.   Patient Active Problem List   Diagnosis Date Noted  . Shortness of breath 09/03/2012  . ETD (eustachian tube dysfunction) 06/16/2012  . Cerumen impaction 06/16/2012  . Gallstones 05/26/2012  . Elevated transaminase level 05/18/2012  . Spinal stenosis of lumbar region 12/10/2011  . Mixed incontinence urge and stress 12/10/2011  . Renal insufficiency 11/04/2011  . Goiter 01/14/2011  . Tongue  swelling 12/01/2010  . Fatty liver 08/28/2010  . DM 07/09/2010  . HYPERCHOLESTEROLEMIA 07/09/2010  . HYPERKALEMIA 07/09/2010  . REFLEX SYMPATHETIC DYSTROPHY 07/09/2010  . POLYNEUROPATHY 07/09/2010  . HYPERTENSION 07/09/2010  . CARDIOMYOPATHY 07/09/2010  . OVERACTIVE BLADDER 07/09/2010    Past Medical History  Diagnosis Date  . Arthritis   . Diabetes mellitus   . Headache(784.0)   . GERD (gastroesophageal reflux disease)   . Ulcer   . Hyperlipidemia   . Migraine   . Colon polyps   . Thyroid disease   . Shingles   . Urine incontinence   . Goiter past remote    treated with RI  . Other primary cardiomyopathies   . Hyperpotassemia   . Inflammatory and toxic neuropathy, unspecified   . Reflex sympathetic dystrophy, unspecified   . Hypertonicity of bladder   . Hypertension   . Shortness of breath     ambulation  . Migraine     hx of  . Vertigo     hx of  . Overactive bladder     Past Surgical History  Procedure Laterality Date  . Breast surgery  19599    breast cyst  . Finger surgery  [place]  . Tubal ligation  1976  . Kidney donation  1989    left kidney  . Spine surgery  1999  . Ct scan  3/12    outside hosp- lung nodule and gallstones  . Upper gastrointestinal endoscopy    .  Colonoscopy  7/12    normal (hx of polyps in past)   . Back surgery    . Eye surgery      cataract removal in left eye; several eye injections  . Lumbar laminectomy/decompression microdiscectomy  01/31/2012    Procedure: LUMBAR LAMINECTOMY/DECOMPRESSION MICRODISCECTOMY 2 LEVELS;  Surgeon: Eustace Moore, MD;  Location: [facility];  Service: Neurosurgery;  Laterality: N/A;  Thoracic twelve-lumbar one, lumbar one-two laminectomy   . Cardiac catheterization      Baldo Ash    History   Social History  . Marital Status: Married    Spouse Name: N/A    Number of Children: 2  . Years of Education: N/A   Occupational History  . retired - Production manager     Social History Main  Topics  . Smoking status: Never Smoker   . Smokeless tobacco: Never Used  . Alcohol Use: No  . Drug Use: No  . Sexual Activity: Not on file   Other Topics Concern  . Not on file   Social History Narrative   Married 2nd time   Daughter is [name]     Family History  Problem Relation Age of Onset  . Arthritis Mother   . Cancer Mother     uterine and mouth  . Hyperlipidemia Mother   . Stroke Mother   . Hypertension Mother   . Alcohol abuse Father   . Diabetes Father   . Cancer Sister     breast  . Diabetes Sister   . Cancer Brother     lung cancer  . Kidney disease Brother   . Diabetes Brother   . Hyperlipidemia Sister   . Heart disease Sister   . Hypertension Sister   . Diabetes Sister   . Hyperlipidemia Brother   . Kidney failure Brother   . Diabetes Daughter     Allergies  Allergen Reactions  . Ace Inhibitors Swelling    REACTION: tongue swelling  . Codeine Other (See Comments)    REACTION: nausea  . Lisinopril Swelling    Swelling of tongue  . Nsaids   . Percocet [Oxycodone-Acetaminophen]     Lips swell  . Tylenol [Acetaminophen]     Medication list has been reviewed and updated.  Outpatient Prescriptions Prior to Visit  Medication Sig Dispense Refill  . amLODipine (NORVASC) 5 MG tablet Take 2.5 mg by mouth daily.      Marland Kitchen aspirin 81 MG tablet Take 81 mg by mouth daily.        . carvedilol (COREG) 12.5 MG tablet Take 12.5 mg by mouth 2 (two) times daily with a meal.        . cholecalciferol (VITAMIN D) 1000 UNITS tablet Take 1,000 Units by mouth daily.      Marland Kitchen EPINEPHrine (EPIPEN 2-PAK IJ) Inject as directed as directed.       . fluticasone (FLONASE) 50 MCG/ACT nasal spray Place 2 sprays into the nose daily as needed.      . furosemide (LASIX) 20 MG tablet Take 20 mg by mouth as needed.      . gabapentin (NEURONTIN) 300 MG capsule Take 1 capsule (300 mg total) by mouth 3 (three) times daily.  270 capsule  1  . glimepiride (AMARYL) 2 MG tablet  Take 1 tablet (2 mg total) by mouth 2 (two) times daily.  180 tablet  1  . HYDROcodone-acetaminophen (NORCO/VICODIN) 5-325 MG per tablet Take 1 tablet by mouth every 6 (six) hours as needed.  For pain      . isosorbide mononitrate (IMDUR) 30 MG 24 hr tablet Take 30 mg by mouth daily.      . metFORMIN (GLUCOPHAGE-XR) 500 MG 24 hr tablet TAKE TWO TABLETS BY MOUTH TWICE DAILY  360 tablet  0  . MILK THISTLE PO Take 1 capsule by mouth daily.      . mirabegron ER ([name]) 50 MG TB24 Take by mouth daily.      . nitroGLYCERIN (NITROSTAT) 0.4 MG SL tablet Place 0.4 mg under the tongue every 5 (five) minutes as needed. For chest pain      . pantoprazole (PROTONIX) 40 MG tablet Take 1 tablet (40 mg total) by mouth daily.  90 tablet  1  . tapentadol (NUCYNTA) 50 MG TABS Take 50 mg by mouth 3 (three) times daily as needed. For severe pain      . vitamin B-12 (CYANOCOBALAMIN) 1000 MCG tablet Take 1,000 mcg by mouth daily.       No facility-administered medications prior to visit.    Review of Systems:   GEN: No fevers, chills. Nontoxic. Primarily MSK c/o today. MSK: Detailed in the HPI GI: tolerating PO intake without difficulty Neuro: detailed above Otherwise the pertinent positives of the ROS are noted above.    Physical Examination: BP 130/78  Pulse 74  Temp(Src) 97.5 F (36.4 C) (Oral)  Wt 156 lb (70.761 kg)  BMI 28.53 kg/m2  SpO2 98%  LMP 06/03/1992  Ideal Body Weight:     GEN: WDWN, NAD, Non-toxic, Alert & Oriented x 3 HEENT: Atraumatic, Normocephalic.  Ears and Nose: No external deformity. EXTR: No clubbing/cyanosis/edema NEURO: Normal gait, mildly antalgic.  PSYCH: Normally interactive. Conversant. Not depressed or anxious appearing.  Calm demeanor.   HIP EXAM: SIDE: R ROM: Abduction, Flexion, Internal and External range of motion: mild rest abduction, IROM and EROM wnl Pain with terminal IROM and EROM: no GTB: TTP on R SLR: NEG Knees: No effusion FABER: NT REVERSE  FABER: NT, neg Piriformis: NT at direct palpation Str: flexion: 5/5 abduction: 4+/5 adduction: 5/5 Strength testing tender with abd    Assessment and Plan: Trochanteric bursitis of right hip   Classic gtb, some symptoms from back. Abd str encouraged.  Trochanteric Bursitis Injection, R Verbal consent obtained. Risks (including infection, potential atrophy), benefits, and alternatives reviewed. Greater trochanter sterilely prepped with Chloraprep. Ethyl Chloride used for anesthesia. 8 cc of Lidocaine 1% injected with 2 cc of 40 mg Depo-Medrol into trochanteric bursa at area of maximal tenderness at greater trochanter. Needle taken to bone to troch bursa, flows easily. Bursa massaged. No bleeding and no complications. Decreased pain after injection. Needle: 22 gauge spinal needle   Signed, Kaspar Albornoz T. Shellene Sweigert, MD 03/03/2013 9:51 AM

## 2013-03-11 ENCOUNTER — Other Ambulatory Visit: Payer: Self-pay | Admitting: Family Medicine

## 2013-03-11 DIAGNOSIS — R748 Abnormal levels of other serum enzymes: Secondary | ICD-10-CM

## 2013-03-18 ENCOUNTER — Other Ambulatory Visit: Payer: Self-pay | Admitting: Family Medicine

## 2013-03-18 ENCOUNTER — Encounter: Payer: Self-pay | Admitting: Cardiovascular Disease

## 2013-03-18 ENCOUNTER — Other Ambulatory Visit (INDEPENDENT_AMBULATORY_CARE_PROVIDER_SITE_OTHER): Payer: Medicare Other

## 2013-03-18 ENCOUNTER — Ambulatory Visit (INDEPENDENT_AMBULATORY_CARE_PROVIDER_SITE_OTHER): Payer: Medicare Other | Admitting: Cardiovascular Disease

## 2013-03-18 VITALS — BP 132/60 | HR 70 | Ht 62.0 in | Wt 155.5 lb

## 2013-03-18 DIAGNOSIS — I10 Essential (primary) hypertension: Secondary | ICD-10-CM

## 2013-03-18 DIAGNOSIS — E875 Hyperkalemia: Secondary | ICD-10-CM

## 2013-03-18 DIAGNOSIS — I428 Other cardiomyopathies: Secondary | ICD-10-CM

## 2013-03-18 DIAGNOSIS — E7800 Pure hypercholesterolemia, unspecified: Secondary | ICD-10-CM

## 2013-03-18 DIAGNOSIS — R0602 Shortness of breath: Secondary | ICD-10-CM

## 2013-03-18 DIAGNOSIS — R748 Abnormal levels of other serum enzymes: Secondary | ICD-10-CM

## 2013-03-18 DIAGNOSIS — E119 Type 2 diabetes mellitus without complications: Secondary | ICD-10-CM

## 2013-03-18 NOTE — Progress Notes (Signed)
Patient ID: Morgan Hubbard, female    DOB: 16-Jun-1934, 78 y.o.   MRN: [id number]  HPI Comments: Morgan Hubbard is a very pleasant 78 year old woman with past medical history of nonischemic cardiomyopathy, prior ejection fraction of 30% ,  improved on followup outside echocardiogram to 45%, old left bundle branch block, nephrectomy/donated kidney in 1989, possible angioedema on lisinopril, back surgery x3, poorly controlled diabetes, previously seen for shortness of breath. Morgan Hubbard presents for routine followup  On Morgan Hubbard last clinic visit, Morgan Hubbard  had worsening fatigue, shortness of breath with exertion. Conditioning was felt to be an issue. Also with severe back disease Morgan Hubbard has significant neuropathy in Morgan Hubbard legs. Morgan Hubbard has not been taking Lasix as Morgan Hubbard has overactive bladder. Morgan Hubbard is working with endocrine for Morgan Hubbard diabetes. Morgan Hubbard reports hemoglobin A1c is down from 11, down to 8.   statin was held for elevated LFTs (I believe Morgan Hubbard was on pravastatin 80 mg). Cholesterol has climbed up to 240 Morgan Hubbard is working with physical therapy. Currently using a walker for most of Morgan Hubbard walking. Recent lab work showed elevated AST and alkaline phosphatase, elevated potassium 5.5. Morgan Hubbard reports that Morgan Hubbard eats a significant amount of fruit, recently eating peaches.  Morgan Hubbard reports cardiac catheterization 08/30/2010 in [place] that showed no significant coronary artery disease Last echocardiogram at outside facility showed ejection fraction 45%  Morgan Hubbard does report Morgan Hubbard sister has history of pancreatic cancer previous ultrasound showing gallstones and fatty liver  EKG shows normal sinus rhythm with rate 70 beats per minute with left bundle branch block     Outpatient Encounter Prescriptions as of 03/18/2013  Medication Sig Dispense Refill  . amLODipine (NORVASC) 5 MG tablet Take 2.5 mg by mouth daily.      Marland Kitchen aspirin 81 MG tablet Take 81 mg by mouth daily.        . carvedilol (COREG) 12.5 MG tablet Take 12.5 mg by mouth 2 (two) times daily with  a meal.        . cholecalciferol (VITAMIN D) 1000 UNITS tablet Take 1,000 Units by mouth daily.      Marland Kitchen EPINEPHrine (EPIPEN 2-PAK IJ) Inject as directed as directed.       . gabapentin (NEURONTIN) 300 MG capsule Take 1 capsule (300 mg total) by mouth 3 (three) times daily.  270 capsule  1  . glimepiride (AMARYL) 2 MG tablet Take 1 tablet (2 mg total) by mouth 2 (two) times daily.  180 tablet  1  . HYDROcodone-acetaminophen (NORCO/VICODIN) 5-325 MG per tablet Take 1 tablet by mouth every 6 (six) hours as needed. For pain      . isosorbide mononitrate (IMDUR) 30 MG 24 hr tablet Take 30 mg by mouth daily.      . metFORMIN (GLUCOPHAGE-XR) 500 MG 24 hr tablet TAKE TWO TABLETS BY MOUTH TWICE DAILY  360 tablet  0  . MILK THISTLE PO Take 1 capsule by mouth daily.      . mirabegron ER ([name]) 50 MG TB24 Take by mouth daily.      . nitroGLYCERIN (NITROSTAT) 0.4 MG SL tablet Place 0.4 mg under the tongue every 5 (five) minutes as needed. For chest pain      . pantoprazole (PROTONIX) 40 MG tablet Take 1 tablet (40 mg total) by mouth daily.  90 tablet  1  . sitaGLIPtin (JANUVIA) 100 MG tablet Take 100 mg by mouth daily.       . traMADol (ULTRAM) 50 MG tablet Take 50 mg by mouth daily  as needed for pain.      . vitamin B-12 (CYANOCOBALAMIN) 1000 MCG tablet Take 1,000 mcg by mouth daily.       Review of Systems  Constitutional: Negative.   HENT: Negative.   Eyes: Negative.   Respiratory: Positive for shortness of breath.   Cardiovascular: Positive for leg swelling.  Gastrointestinal: Negative.   Musculoskeletal: Positive for back pain.  Skin: Negative.   Neurological: Negative.   Psychiatric/Behavioral: Negative.   All other systems reviewed and are negative.    BP 132/60  Pulse 70  Ht 5\' 2"  (1.575 m)  Wt 155 lb 8 oz (70.534 kg)  BMI 28.43 kg/m2  LMP 06/03/1992  Physical Exam  Nursing note and vitals reviewed. Constitutional: Morgan Hubbard is oriented to person, place, and time. Morgan Hubbard appears  well-developed and well-nourished.  HENT:  Head: Normocephalic.  Nose: Nose normal.  Mouth/Throat: Oropharynx is clear and moist.  Eyes: Conjunctivae are normal. Pupils are equal, round, and reactive to light.  Neck: Normal range of motion. Neck supple. No JVD present.  Cardiovascular: Normal rate, regular rhythm, S1 normal, S2 normal, normal heart sounds and intact distal pulses.  Exam reveals no gallop and no friction rub.   No murmur heard. Pulmonary/Chest: Effort normal and breath sounds normal. No respiratory distress. Morgan Hubbard has no wheezes. Morgan Hubbard has no rales. Morgan Hubbard exhibits no tenderness.  Abdominal: Soft. Bowel sounds are normal. Morgan Hubbard exhibits no distension. There is no tenderness.  Musculoskeletal: Normal range of motion. Morgan Hubbard exhibits no edema and no tenderness.  Lymphadenopathy:    Morgan Hubbard has no cervical adenopathy.  Neurological: Morgan Hubbard is alert and oriented to person, place, and time. Coordination normal.  Skin: Skin is warm and dry. No rash noted. No erythema.  Psychiatric: Morgan Hubbard has a normal mood and affect. Morgan Hubbard behavior is normal. Judgment and thought content normal.    Assessment and Plan

## 2013-03-18 NOTE — Assessment & Plan Note (Signed)
No longer on pravastatin secondary to elevated LFTs. He did improve without the statin, now mildly elevated alkaline phosphatase and AST. She does take Vicodin but at very low doses. Given her diabetes, would favor restarting a low dose statin. We'll wait until recent lab work is back. Numbers may improve with better diabetes control. Encouraged her to increase her fiber intake.

## 2013-03-18 NOTE — Assessment & Plan Note (Signed)
Encouraged her to moderate her fruit intake

## 2013-03-18 NOTE — Assessment & Plan Note (Signed)
Blood pressure is well controlled on today's visit. No changes made to the medications. 

## 2013-03-18 NOTE — Assessment & Plan Note (Signed)
She is concerned about shortness of breath. She is requesting repeat echocardiogram to evaluate her ejection fraction. This will be ordered at her convenience.

## 2013-03-18 NOTE — Patient Instructions (Signed)
You are doing well. No medication changes were made.  We will schedule you for an echocardiogram for shortness of breath  Please call us if you have new issues that need to be addressed before your next appt.  Your physician wants you to follow-up in: 6 months.  You will receive a reminder letter in the mail two months in advance. If you don't receive a letter, please call our office to schedule the follow-up appointment.

## 2013-03-18 NOTE — Assessment & Plan Note (Signed)
Encouraged careful diet, water therapy if she can tolerate this. She has followup with endocrine.

## 2013-03-23 ENCOUNTER — Other Ambulatory Visit (INDEPENDENT_AMBULATORY_CARE_PROVIDER_SITE_OTHER): Payer: Medicare Other

## 2013-03-23 ENCOUNTER — Other Ambulatory Visit: Payer: Self-pay

## 2013-03-23 DIAGNOSIS — R0602 Shortness of breath: Secondary | ICD-10-CM

## 2013-03-23 DIAGNOSIS — I059 Rheumatic mitral valve disease, unspecified: Secondary | ICD-10-CM

## 2013-03-23 DIAGNOSIS — I428 Other cardiomyopathies: Secondary | ICD-10-CM

## 2013-03-25 LAB — ALKALINE PHOSPHATASE ISOENZYMES
Bone Isoenzymes: 40 % (ref 28–66)
Intestinal Isoenzymes: 8 % (ref 1–24)
Liver Isoenzymes: 52 % (ref 25–69)
Macrohepatic isoenzymes: 0 %

## 2013-03-30 ENCOUNTER — Telehealth: Payer: Self-pay

## 2013-03-30 NOTE — Telephone Encounter (Signed)
Message copied by Stana Bunting on Tue Mar 30, 2013 11:36 AM ------      Message from: Minna Merritts      Created: Mon Mar 29, 2013  5:58 PM       Ejection fraction was previously 30%      Someone outside our office did repeat echocardiogram suggesting 40% EF            On recent echocardiogram ejection fraction estimated at 25-30 %      No dramatic change from before but this may explain why she does not feel well      No  sign of significant fluid overload      Would continue current medications for now ------

## 2013-03-31 NOTE — Telephone Encounter (Signed)
Message copied by Stana Bunting on Wed Mar 31, 2013  1:57 PM ------      Message from: Minna Merritts      Created: Mon Mar 29, 2013  5:58 PM       Ejection fraction was previously 30%      Someone outside our office did repeat echocardiogram suggesting 40% EF            On recent echocardiogram ejection fraction estimated at 25-30 %      No dramatic change from before but this may explain why she does not feel well      No  sign of significant fluid overload      Would continue current medications for now ------

## 2013-03-31 NOTE — Telephone Encounter (Signed)
lmom 

## 2013-04-02 ENCOUNTER — Telehealth: Payer: Self-pay

## 2013-04-02 NOTE — Telephone Encounter (Signed)
Spoke w/ pt.  She is requesting a copy of the disc with her echo that she can pick up on Monday at her visit with Dr. Rockey Situ, as she would like to bring it to [facility] for review.

## 2013-04-02 NOTE — Telephone Encounter (Signed)
Spoke w/ pt.  She is very concerned that ef is so low and questions if this is correct.  Pt sched to see Dr. Rockey Situ 113/14 @ 7:45 to go over this and past echos.

## 2013-04-02 NOTE — Telephone Encounter (Signed)
Message copied by Stana Bunting on Fri Apr 02, 2013 10:11 AM ------      Message from: Minna Merritts      Created: Mon Mar 29, 2013  5:58 PM       Ejection fraction was previously 30%      Someone outside our office did repeat echocardiogram suggesting 40% EF            On recent echocardiogram ejection fraction estimated at 25-30 %      No dramatic change from before but this may explain why she does not feel well      No  sign of significant fluid overload      Would continue current medications for now ------

## 2013-04-02 NOTE — Telephone Encounter (Signed)
Missy to make copy of recent echo and bring to office before pt's appt on Monday.

## 2013-04-05 ENCOUNTER — Ambulatory Visit (INDEPENDENT_AMBULATORY_CARE_PROVIDER_SITE_OTHER): Payer: Medicare Other | Admitting: Cardiovascular Disease

## 2013-04-05 ENCOUNTER — Encounter: Payer: Self-pay | Admitting: Cardiovascular Disease

## 2013-04-05 VITALS — BP 130/64 | HR 76 | Ht 62.0 in | Wt 155.2 lb

## 2013-04-05 DIAGNOSIS — R0602 Shortness of breath: Secondary | ICD-10-CM

## 2013-04-05 DIAGNOSIS — I10 Essential (primary) hypertension: Secondary | ICD-10-CM

## 2013-04-05 DIAGNOSIS — I428 Other cardiomyopathies: Secondary | ICD-10-CM

## 2013-04-05 MED ORDER — HYDRALAZINE HCL 25 MG PO TABS: 25.0000 mg | ORAL_TABLET | Freq: Three times a day (TID) | ORAL | Status: DC

## 2013-04-05 NOTE — Progress Notes (Signed)
Patient ID: Morgan Hubbard, female    DOB: 04-20-1935, 78 y.o.   MRN: [id number]  HPI Comments: Morgan Hubbard is a very pleasant 78 year old woman with past medical history of nonischemic cardiomyopathy, prior ejection fraction of [id number] in [id number] of uncertain etiology , initially improved with medication, been slight drop, after changes to her medications, had significant improvement back to normal ejection fraction greater than 45%, history of left bundle branch block, nephrectomy/donated kidney in 1989, possible angioedema on lisinopril, back surgery x3, poorly controlled diabetes, previously seen for shortness of breath. She presents for routine followup  On her last clinic visit, she had worsening fatigue, shortness of breath with exertion. Conditioning was felt to be an issue. Also with severe back disease she has significant neuropathy in her legs. She has not been taking Lasix as she has overactive bladder. She is working with endocrine for her diabetes. She reports hemoglobin A1c is down from 11, down to 8.   statin was held for elevated LFTs (I believe she was on pravastatin 80 mg). Cholesterol has climbed up to 240  Echocardiogram was done for her fatigue and shortness of breath. This has shown ejection fraction of 25-30% which is a significant drop from prior ejection fraction February 2013 45% done at [facility]. She denies any lower stone edema, no PND or orthopnea. Right ventricular systolic pressures on the echo showed normal RVSP. She denies any recent stressors, no recent viral infection.  Prior cardiac catheterization 08/30/2010 in [place]  showed no significant coronary artery disease  EKG shows normal sinus rhythm with rate 70 beats per minute with left bundle branch block    Outpatient Encounter Prescriptions as of 04/05/2013  Medication Sig  . amLODipine (NORVASC) 5 MG tablet Take 2.5 mg by mouth daily.  Marland Kitchen aspirin 81 MG tablet Take 81 mg by mouth daily.    . carvedilol (COREG)  12.5 MG tablet Take 12.5 mg by mouth 2 (two) times daily with a meal.    . cholecalciferol (VITAMIN D) 1000 UNITS tablet Take 1,000 Units by mouth daily.  Marland Kitchen EPINEPHrine (EPIPEN 2-PAK IJ) Inject as directed as directed.   . gabapentin (NEURONTIN) 300 MG capsule Take 1 capsule (300 mg total) by mouth 3 (three) times daily.  Marland Kitchen glimepiride (AMARYL) 2 MG tablet Take 1 tablet (2 mg total) by mouth 2 (two) times daily.  Marland Kitchen HYDROcodone-acetaminophen (NORCO/VICODIN) 5-325 MG per tablet Take 1 tablet by mouth every 6 (six) hours as needed. For pain  . isosorbide mononitrate (IMDUR) 30 MG 24 hr tablet Take 30 mg by mouth daily.  . metFORMIN (GLUCOPHAGE-XR) 500 MG 24 hr tablet TAKE TWO TABLETS BY MOUTH TWICE DAILY  . MILK THISTLE PO Take 1 capsule by mouth daily.  . mirabegron ER ([name]) 50 MG TB24 Take by mouth daily.  . nitroGLYCERIN (NITROSTAT) 0.4 MG SL tablet Place 0.4 mg under the tongue every 5 (five) minutes as needed. For chest pain  . pantoprazole (PROTONIX) 40 MG tablet Take 1 tablet (40 mg total) by mouth daily.  . sitaGLIPtin (JANUVIA) 100 MG tablet Take 100 mg by mouth daily.   . traMADol (ULTRAM) 50 MG tablet Take 50 mg by mouth daily as needed for pain.  . vitamin B-12 (CYANOCOBALAMIN) 1000 MCG tablet Take 1,000 mcg by mouth daily.    Review of Systems  Constitutional: Positive for fatigue.  HENT: Negative.   Eyes: Negative.   Respiratory: Positive for shortness of breath.   Gastrointestinal: Negative.   Musculoskeletal: Positive  for back pain.  Skin: Negative.   Neurological: Negative.   Psychiatric/Behavioral: Negative.   All other systems reviewed and are negative.    BP 130/64  Pulse 76  Ht 5\' 2"  (1.575 m)  Wt 155 lb 4 oz (70.421 kg)  BMI 28.39 kg/m2  LMP 06/03/1992  Physical Exam  Nursing note and vitals reviewed. Constitutional: She is oriented to person, place, and time. She appears well-developed and well-nourished.  HENT:  Head: Normocephalic.  Nose: Nose  normal.  Mouth/Throat: Oropharynx is clear and moist.  Eyes: Conjunctivae are normal. Pupils are equal, round, and reactive to light.  Neck: Normal range of motion. Neck supple. No JVD present.  Cardiovascular: Normal rate, regular rhythm, S1 normal, S2 normal, normal heart sounds and intact distal pulses.  Exam reveals no gallop and no friction rub.   No murmur heard. Pulmonary/Chest: Effort normal and breath sounds normal. No respiratory distress. She has no wheezes. She has no rales. She exhibits no tenderness.  Abdominal: Soft. Bowel sounds are normal. She exhibits no distension. There is no tenderness.  Musculoskeletal: Normal range of motion. She exhibits no edema and no tenderness.  Lymphadenopathy:    She has no cervical adenopathy.  Neurological: She is alert and oriented to person, place, and time. Coordination normal.  Skin: Skin is warm and dry. No rash noted. No erythema.  Psychiatric: She has a normal mood and affect. Her behavior is normal. Judgment and thought content normal.    Assessment and Plan

## 2013-04-05 NOTE — Assessment & Plan Note (Signed)
We will hold the amlodipine in an effort to add hydralazine and other cardiac medications for her depressed ejection fraction

## 2013-04-05 NOTE — Assessment & Plan Note (Signed)
Suspect symptoms of shortness of breath could be secondary to a drop in her ejection fraction. Unable to exclude deconditioning is a component of her symptoms. No signs of acute systolic CHF

## 2013-04-05 NOTE — Assessment & Plan Note (Signed)
Etiology of her cardiomyopathy is unclear. Prior drop in her ejection fraction 5 years ago of uncertain etiology at that time. It did improve with medical management. No signs of acute systolic CHF on today's visit. She is tolerating her beta blocker and isosorbide with no significant symptoms. We have discussed various other medication treatment options available to her. These include adding hydralazine, losartan given possible ACE inhibitor allergy, spironolactone, increasing her isosorbide and carvedilol. After discussion, she will add low-dose hydralazine 25 mg 3 times a day if tolerated. I'm encouraged that she is asymptomatic though concerned given the drop in her ejection fraction from last year. She reports having periodic followup at [facility] cardiology. She has been seen in [place] as well for her cardiology care. This will make it somewhat challenging to gather various bits of data and keep up to date on her medication changes.

## 2013-04-05 NOTE — Patient Instructions (Addendum)
Pleas hold the amlodipine Please start hydralazine 25 mg three times a day (four times a day if blood pressure tolerates) Please monitor your blood pressure at home  Please call us if you have new issues that need to be addressed before your next appt.  Your physician wants you to follow-up in: 1 month.

## 2013-04-07 ENCOUNTER — Encounter: Payer: Self-pay | Admitting: Family Medicine

## 2013-04-20 ENCOUNTER — Telehealth: Payer: Self-pay

## 2013-04-20 NOTE — Telephone Encounter (Signed)
Pt states that Dr Rockey Situ has put her on  Apresoline 25 mg to lower ejection fraction, she is requesting that Dr Rockey Situ order another echo before her appt in December. She is requesting you consult with Dr. Rockey Situ and call her back.

## 2013-04-20 NOTE — Telephone Encounter (Signed)
Pt called back and stated that she doesn't think ins will cover another echo, so she will speak w/ Dr. Rockey Situ at her next ov.

## 2013-04-28 ENCOUNTER — Ambulatory Visit (INDEPENDENT_AMBULATORY_CARE_PROVIDER_SITE_OTHER): Payer: Medicare Other | Admitting: Family Medicine

## 2013-04-28 ENCOUNTER — Encounter: Payer: Self-pay | Admitting: Family Medicine

## 2013-04-28 ENCOUNTER — Ambulatory Visit (INDEPENDENT_AMBULATORY_CARE_PROVIDER_SITE_OTHER)
Admission: RE | Admit: 2013-04-28 | Discharge: 2013-04-28 | Disposition: A | Discharge status: Home / Self Care | Payer: Medicare Other | Source: Ambulatory Visit | Attending: Family Medicine | Admitting: Family Medicine

## 2013-04-28 VITALS — BP 138/80 | HR 74 | Temp 97.7°F | Ht 62.0 in | Wt 157.0 lb

## 2013-04-28 DIAGNOSIS — M25519 Pain in unspecified shoulder: Secondary | ICD-10-CM

## 2013-04-28 DIAGNOSIS — S43101A Unspecified dislocation of right acromioclavicular joint, initial encounter: Secondary | ICD-10-CM

## 2013-04-28 DIAGNOSIS — M25511 Pain in right shoulder: Secondary | ICD-10-CM

## 2013-04-28 DIAGNOSIS — M19019 Primary osteoarthritis, unspecified shoulder: Secondary | ICD-10-CM

## 2013-04-28 DIAGNOSIS — S43109A Unspecified dislocation of unspecified acromioclavicular joint, initial encounter: Secondary | ICD-10-CM

## 2013-04-28 DIAGNOSIS — M259 Joint disorder, unspecified: Secondary | ICD-10-CM

## 2013-04-28 MED ORDER — TRAMADOL HCL 50 MG PO TABS
50.0000 mg | ORAL_TABLET | Freq: Every day | ORAL | Status: DC | PRN
Start: 2013-04-28 — End: 2014-06-10

## 2013-04-28 NOTE — Progress Notes (Signed)
Date:  04/28/2013   Name:  Morgan Hubbard   DOB:  05-09-1935   MRN:  [id number] Gender: female Age: 78 y.o.  Primary Physician:  [name], MD   Chief Complaint: Shoulder Pain   Subjective:   History of Present Illness:  Morgan Hubbard is a 78 y.o. pleasant patient who presents with the following:  R shoulder pain: very pleasant patient with multiple medical problems he fell on the point of her shoulder she was at church on Sunday. She also banged her knee and hit her face as well. She has no changes in mentation. Now she has a bump on the top of her shoulder. She has no significant history of shoulder problems on that side and she has no history of prior operations. No history of significant fractures. Now she is having some significant pain and pain with abduction and pain with flexion. There is no bruising.  [name] going into church on Sunday and right shoulder. Has a knot on the right shoulder. 4 days ago. No prior injuries or shoulder.   [facility] joint  Patient Active Problem List   Diagnosis Date Noted  . Nonischemic cardiomyopathy 04/05/2013  . Shortness of breath 09/03/2012  . ETD (eustachian tube dysfunction) 06/16/2012  . Cerumen impaction 06/16/2012  . Gallstones 05/26/2012  . Elevated transaminase level 05/18/2012  . Spinal stenosis of lumbar region 12/10/2011  . Mixed incontinence urge and stress 12/10/2011  . Renal insufficiency 11/04/2011  . Goiter 01/14/2011  . Tongue swelling 12/01/2010  . Fatty liver 08/28/2010  . DM 07/09/2010  . HYPERCHOLESTEROLEMIA 07/09/2010  . HYPERKALEMIA 07/09/2010  . REFLEX SYMPATHETIC DYSTROPHY 07/09/2010  . POLYNEUROPATHY 07/09/2010  . HYPERTENSION 07/09/2010  . CARDIOMYOPATHY 07/09/2010  . OVERACTIVE BLADDER 07/09/2010    Past Medical History  Diagnosis Date  . Arthritis   . Diabetes mellitus   . Headache(784.0)   . GERD (gastroesophageal reflux disease)   . Ulcer   . Hyperlipidemia   . Migraine   . Colon polyps   .  Thyroid disease   . Shingles   . Urine incontinence   . Goiter past remote    treated with RI  . Other primary cardiomyopathies   . Hyperpotassemia   . Inflammatory and toxic neuropathy, unspecified   . Reflex sympathetic dystrophy, unspecified   . Hypertonicity of bladder   . Hypertension   . Shortness of breath     ambulation  . Migraine     hx of  . Vertigo     hx of  . Overactive bladder     Past Surgical History  Procedure Laterality Date  . Breast surgery  19599    breast cyst  . Finger surgery  [place]  . Tubal ligation  1976  . Kidney donation  1989    left kidney  . Spine surgery  1999  . Ct scan  3/12    outside hosp- lung nodule and gallstones  . Upper gastrointestinal endoscopy    . Colonoscopy  7/12    normal (hx of polyps in past)   . Back surgery    . Eye surgery      cataract removal in left eye; several eye injections  . Lumbar laminectomy/decompression microdiscectomy  01/31/2012    Procedure: LUMBAR LAMINECTOMY/DECOMPRESSION MICRODISCECTOMY 2 LEVELS;  Surgeon: Eustace Moore, MD;  Location: [place] NEURO ORS;  Service: Neurosurgery;  Laterality: N/A;  Thoracic twelve-lumbar one, lumbar one-two laminectomy   . Cardiac catheterization  [place]    History   Social History  . Marital Status: Married    Spouse Name: N/A    Number of Children: 2  . Years of Education: N/A   Occupational History  . retired - Production manager     Social History Main Topics  . Smoking status: Never Smoker   . Smokeless tobacco: Never Used  . Alcohol Use: No  . Drug Use: No  . Sexual Activity: Not on file   Other Topics Concern  . Not on file   Social History Narrative   Married 2nd time   Daughter is Morgan Hubbard     Family History  Problem Relation Age of Onset  . Arthritis Mother   . Cancer Mother     uterine and mouth  . Hyperlipidemia Mother   . Stroke Mother   . Hypertension Mother   . Alcohol abuse Father   . Diabetes Father   . Cancer  Sister     breast  . Diabetes Sister   . Cancer Brother     lung cancer  . Kidney disease Brother   . Diabetes Brother   . Hyperlipidemia Sister   . Heart disease Sister   . Hypertension Sister   . Diabetes Sister   . Hyperlipidemia Brother   . Kidney failure Brother   . Diabetes Daughter     Allergies  Allergen Reactions  . Ace Inhibitors Swelling    REACTION: tongue swelling  . Codeine Other (See Comments)    REACTION: nausea  . Lisinopril Swelling    Swelling of tongue  . Nsaids   . Percocet [Oxycodone-Acetaminophen]     Lips swell  . Tylenol [Acetaminophen]     Medication list has been reviewed and updated.  Review of Systems:  GEN: No fevers, chills. Nontoxic. Primarily MSK c/o today. MSK: Detailed in the HPI GI: tolerating PO intake without difficulty Neuro: No numbness, parasthesias, or tingling associated. Otherwise the pertinent positives of the ROS are noted above.   Objective:   Physical Examination: BP 138/80  Pulse 74  Temp(Src) 97.7 F (36.5 C) (Oral)  Ht 5\' 2"  (1.575 m)  Wt 157 lb (71.215 kg)  BMI 28.71 kg/m2  SpO2 95%  LMP 06/03/1992  Ideal Body Weight: Weight in (lb) to have BMI = 25: 136.4   GEN: WDWN, NAD, Non-toxic, Alert & Oriented x 3 HEENT: Atraumatic, Normocephalic.  Ears and Nose: No external deformity. EXTR: No clubbing/cyanosis/edema NEURO: Normal gait.  PSYCH: Normally interactive. Conversant. Not depressed or anxious appearing.  Calm demeanor.    The clavicle is nontender. All of the RIGHT side. At the a.c. Joint there is a more prominent acromion and the a.c. Ligament is tender to palpation at the a.c. Joint. Nontender in the bicipital groove. Nontender with compression of the humeral head and the length of the humerus. Nontender at the scapula.  Abduction is full passively and full flexion. There is some pain with active movement. Abduction strength is 4+/5 Flexion is 4+/5. Internal and external range of motion is  5/5.  All other special testing of the shoulder is equivocal given acute trauma.  Dg Shoulder Right  04/28/2013   CLINICAL DATA:  Right shoulder pain after falling 4 days ago.  EXAM: RIGHT SHOULDER - 2+ VIEW  COMPARISON:  Chest radiographs 05/11/2012  FINDINGS: The mineralization and alignment are normal. There is no evidence of acute fracture or dislocation. The subacromial space is preserved. There are acromioclavicular and glenohumeral degenerative changes. There  are faint calcifications superolateral to the humeral head, likely hydroxyapatite deposition within the distal rotator cuff. No discrete loose bodies identified. Degenerative changes are noted throughout the thoracic spine.  IMPRESSION: No acute osseous findings. Degenerative changes as described. Suspected calcific rotator cuff tendinosis.   Electronically Signed   By: Camie Patience M.D.   On: 04/28/2013 10:49   The radiological images were independently reviewed by myself in the office and results were reviewed with the patient. My independent interpretation of images:  There is a mild degree of glenohumeral joint osteoarthritic change. Joint space is relatively well preserved. Acromioclavicular joint shows extensive degenerative changes. There is no occult fracture or dislocation. There does appear to be calcific changes in the supraspinatus.  [name], MD 04/28/2013, 8:24 PM   Assessment & Plan:    AC separation, type 2, right, initial encounter  Right shoulder pain - Plan: DG Shoulder Right  AC joint arthropathy  Review anatomy. Given his limitations having a solitary kidney, avoid NSAIDs. She also has fatty liver disease, scar recommended avoiding Tylenol. We have given her some tramadol for pain.  I reviewed basic range of motion again now to avoid frozen shoulder.  Likely all coming from acute a.c. Injury. Partial-thickness cuff tear cannot fully be excluded if she has persistent symptoms, reevaluation would be  indicated.  There are no Patient Instructions on file for this visit.  Orders Today:  Orders Placed This Encounter  Procedures  . DG Shoulder Right    New medications, updates to list, dose adjustments: Meds ordered this encounter  Medications  . traMADol (ULTRAM) 50 MG tablet    Sig: Take 1 tablet (50 mg total) by mouth daily as needed.    Dispense:  40 tablet    Refill:  2    Signed,  Randye Treichler T. Darika Ildefonso, MD, [place] at [facility] [place] [address] Phone: [phone] Fax: [phone]  Updated Complete Medication List:   Medication List       This list is accurate as of: 04/28/13  8:27 PM.  Always use your most recent med list.               aspirin 81 MG tablet  Take 81 mg by mouth daily.     carvedilol 12.5 MG tablet  Commonly known as:  COREG  Take 12.5 mg by mouth 2 (two) times daily with a meal.     cholecalciferol 1000 UNITS tablet  Commonly known as:  VITAMIN D  Take 1,000 Units by mouth daily.     EPIPEN 2-PAK IJ  Inject as directed as directed.     gabapentin 300 MG capsule  Commonly known as:  NEURONTIN  Take 1 capsule (300 mg total) by mouth 3 (three) times daily.     glimepiride 2 MG tablet  Commonly known as:  AMARYL  Take 1 tablet (2 mg total) by mouth 2 (two) times daily.     hydrALAZINE 25 MG tablet  Commonly known as:  APRESOLINE  Take 1 tablet (25 mg total) by mouth 3 (three) times daily.     HYDROcodone-acetaminophen 5-325 MG per tablet  Commonly known as:  NORCO/VICODIN  Take 1 tablet by mouth every 6 (six) hours as needed. For pain     isosorbide mononitrate 30 MG 24 hr tablet  Commonly known as:  IMDUR  Take 30 mg by mouth daily.     metFORMIN 500 MG 24 hr tablet  Commonly known as:  GLUCOPHAGE-XR  TAKE TWO TABLETS BY MOUTH TWICE DAILY     MILK THISTLE PO  Take 1 capsule by mouth daily.     [name] 50 MG Tb24 tablet  Generic drug:  mirabegron ER  Take by  mouth daily.     nitroGLYCERIN 0.4 MG SL tablet  Commonly known as:  NITROSTAT  Place 0.4 mg under the tongue every 5 (five) minutes as needed. For chest pain     pantoprazole 40 MG tablet  Commonly known as:  PROTONIX  Take 1 tablet (40 mg total) by mouth daily.     sitaGLIPtin 100 MG tablet  Commonly known as:  JANUVIA  Take 100 mg by mouth daily.     traMADol 50 MG tablet  Commonly known as:  ULTRAM  Take 1 tablet (50 mg total) by mouth daily as needed.     vitamin B-12 1000 MCG tablet  Commonly known as:  CYANOCOBALAMIN  Take 1,000 mcg by mouth daily.

## 2013-04-28 NOTE — Progress Notes (Signed)
Pre-visit discussion using our clinic review tool. No additional management support is needed unless otherwise documented below in the visit note.  

## 2013-05-04 ENCOUNTER — Ambulatory Visit (INDEPENDENT_AMBULATORY_CARE_PROVIDER_SITE_OTHER): Payer: Medicare Other | Admitting: Cardiovascular Disease

## 2013-05-04 ENCOUNTER — Encounter: Payer: Self-pay | Admitting: Cardiovascular Disease

## 2013-05-04 VITALS — BP 162/84 | HR 80 | Ht 62.0 in | Wt 157.2 lb

## 2013-05-04 DIAGNOSIS — I428 Other cardiomyopathies: Secondary | ICD-10-CM

## 2013-05-04 DIAGNOSIS — R0602 Shortness of breath: Secondary | ICD-10-CM

## 2013-05-04 DIAGNOSIS — E119 Type 2 diabetes mellitus without complications: Secondary | ICD-10-CM

## 2013-05-04 DIAGNOSIS — I10 Essential (primary) hypertension: Secondary | ICD-10-CM

## 2013-05-04 DIAGNOSIS — N318 Other neuromuscular dysfunction of bladder: Secondary | ICD-10-CM

## 2013-05-04 MED ORDER — HYDRALAZINE HCL 25 MG PO TABS: 50.0000 mg | ORAL_TABLET | Freq: Three times a day (TID) | ORAL | Status: DC

## 2013-05-04 NOTE — Assessment & Plan Note (Signed)
We have encouraged continued exercise, careful diet management in an effort to lose weight. 

## 2013-05-04 NOTE — Assessment & Plan Note (Signed)
Blood pressure at home by her report is in the high 130s. We'll continue to advance her medical management with goal systolic pressures less than 130.

## 2013-05-04 NOTE — Assessment & Plan Note (Signed)
Waxing waning cardiomyopathy, recent echocardiogram showing decrease in her ejection fraction. She reports she has MRI scheduled at [facility] January 2014. We'll continue with aggressive medical management. Suggested she increase hydralazine to 50 mg 3 times a day. Blood pressure is elevated today, likely from right shoulder pain. She does not want losartan or for [place] to add additional medications to her regimen at this time. We have suggested we will try to work with what she is on currently. She's asymptomatic apart from mild shortness of breath with exertion.

## 2013-05-04 NOTE — Progress Notes (Signed)
Patient ID: TALEEN DICKARD, female    DOB: [birth year], 78 y.o.   MRN: [id number]  HPI Comments: Ms. Moothart is a very pleasant 78 year old woman with past medical history of nonischemic cardiomyopathy, prior ejection fraction of [id number] in [id number] of uncertain etiology , initially improved with medication,  slight drop after changes to her medications, had significant improvement back to ejection fraction greater than 45%, history of left bundle branch block, nephrectomy/donated kidney in 1989, possible angioedema on lisinopril, back surgery x3, poorly controlled diabetes, previously seen for shortness of breath. She presents for routine followup  Starting summer 2014, she had worsening fatigue, shortness of breath with exertion. Conditioning was felt to be an issue. Also with severe back disease she has significant neuropathy in her legs. She has not been taking Lasix as she has overactive bladder. She is working with endocrine for her diabetes. hemoglobin A1c is down from 11, down to 8.   statin was held for elevated LFTs (I believe she was on pravastatin 80 mg). Cholesterol climbed up to 240  Echocardiogram was done for her fatigue and shortness of breath.  ejection fraction of 25-30% which was significant drop from prior ejection fraction February 2013 (45%) done at [facility].  normal RVSP.  In followup, she was started on hydralazine 25 mg 3 times a day. She has tolerated this well. Systolic pressure continues to be in the high 130s. Recent fall with injury to her right shoulder, now in a sling. She reports that she does not want Lasix as she has a bladder issue. She does not want that medication such as losartan as she feels she is taking enough medications. She would be okay with increasing the hydralazine.   Prior cardiac catheterization 08/30/2010 in [place]  showed no significant coronary artery disease  EKG shows normal sinus rhythm with rate 80 and beats per minute with left bundle branch  block    Outpatient Encounter Prescriptions as of 05/04/2013  Medication Sig  . aspirin 81 MG tablet Take 81 mg by mouth daily.    . carvedilol (COREG) 12.5 MG tablet Take 12.5 mg by mouth 2 (two) times daily with a meal.    . cholecalciferol (VITAMIN D) 1000 UNITS tablet Take 1,000 Units by mouth daily.  Marland Kitchen EPINEPHrine (EPIPEN 2-PAK IJ) Inject as directed as directed.   . gabapentin (NEURONTIN) 300 MG capsule Take 1 capsule (300 mg total) by mouth 3 (three) times daily.  Marland Kitchen glimepiride (AMARYL) 2 MG tablet Take 1 tablet (2 mg total) by mouth 2 (two) times daily.  . hydrALAZINE (APRESOLINE) 25 MG tablet Take 1 tablet (25 mg total) by mouth 3 (three) times daily.  Marland Kitchen HYDROcodone-acetaminophen (NORCO/VICODIN) 5-325 MG per tablet Take 1 tablet by mouth every 6 (six) hours as needed. For pain  . isosorbide mononitrate (IMDUR) 30 MG 24 hr tablet Take 30 mg by mouth daily.  . metFORMIN (GLUCOPHAGE-XR) 500 MG 24 hr tablet TAKE TWO TABLETS BY MOUTH TWICE DAILY  . MILK THISTLE PO Take 1 capsule by mouth daily.  . mirabegron ER ([name]) 50 MG TB24 Take by mouth daily.  . nitroGLYCERIN (NITROSTAT) 0.4 MG SL tablet Place 0.4 mg under the tongue every 5 (five) minutes as needed. For chest pain  . pantoprazole (PROTONIX) 40 MG tablet Take 1 tablet (40 mg total) by mouth daily.  . sitaGLIPtin (JANUVIA) 100 MG tablet Take 100 mg by mouth daily.   . traMADol (ULTRAM) 50 MG tablet Take 1 tablet (50 mg total)  by mouth daily as needed.  . vitamin B-12 (CYANOCOBALAMIN) 1000 MCG tablet Take 1,000 mcg by mouth daily.    Review of Systems  HENT: Negative.   Eyes: Negative.   Respiratory: Positive for shortness of breath.   Gastrointestinal: Negative.   Musculoskeletal: Positive for arthralgias.  Skin: Negative.   Neurological: Negative.   Psychiatric/Behavioral: Negative.   All other systems reviewed and are negative.    BP 162/84  Pulse 80  Ht 5\' 2"  (1.575 m)  Wt 157 lb 4 oz (71.328 kg)  BMI 28.75  kg/m2  LMP 06/03/1992  Physical Exam  Nursing note and vitals reviewed. Constitutional: She is oriented to person, place, and time. She appears well-developed and well-nourished.  HENT:  Head: Normocephalic.  Nose: Nose normal.  Mouth/Throat: Oropharynx is clear and moist.  Eyes: Conjunctivae are normal. Pupils are equal, round, and reactive to light.  Neck: Normal range of motion. Neck supple. No JVD present.  Cardiovascular: Normal rate, regular rhythm, S1 normal, S2 normal, normal heart sounds and intact distal pulses.  Exam reveals no gallop and no friction rub.   No murmur heard. Pulmonary/Chest: Effort normal and breath sounds normal. No respiratory distress. She has no wheezes. She has no rales. She exhibits no tenderness.  Abdominal: Soft. Bowel sounds are normal. She exhibits no distension. There is no tenderness.  Musculoskeletal: Normal range of motion. She exhibits no edema and no tenderness.  Lymphadenopathy:    She has no cervical adenopathy.  Neurological: She is alert and oriented to person, place, and time. Coordination normal.  Skin: Skin is warm and dry. No rash noted. No erythema.  Psychiatric: She has a normal mood and affect. Her behavior is normal. Judgment and thought content normal.    Assessment and Plan

## 2013-05-04 NOTE — Assessment & Plan Note (Signed)
She does not want any diuretics as this makes her symptoms of overactive bladder even worse. Currently with no clinical signs of her failure.

## 2013-05-04 NOTE — Patient Instructions (Signed)
Please increase the hydralazine to 50 mg three times a day  Please call us if you have new issues that need to be addressed before your next appt.  Your physician wants you to follow-up in: 3 months.  You will receive a reminder letter in the mail two months in advance. If you don't receive a letter, please call our office to schedule the follow-up appointment.

## 2013-05-10 ENCOUNTER — Encounter: Payer: Self-pay | Admitting: Family Medicine

## 2013-05-10 ENCOUNTER — Ambulatory Visit (INDEPENDENT_AMBULATORY_CARE_PROVIDER_SITE_OTHER)
Admission: RE | Admit: 2013-05-10 | Discharge: 2013-05-10 | Disposition: A | Discharge status: Home / Self Care | Payer: Medicare Other | Source: Ambulatory Visit | Attending: Family Medicine | Admitting: Family Medicine

## 2013-05-10 ENCOUNTER — Ambulatory Visit (INDEPENDENT_AMBULATORY_CARE_PROVIDER_SITE_OTHER): Payer: Medicare Other | Admitting: Family Medicine

## 2013-05-10 VITALS — BP 140/80 | HR 87 | Temp 97.8°F | Ht 62.0 in | Wt 156.2 lb

## 2013-05-10 DIAGNOSIS — M899 Disorder of bone, unspecified: Secondary | ICD-10-CM

## 2013-05-10 DIAGNOSIS — M25511 Pain in right shoulder: Secondary | ICD-10-CM

## 2013-05-10 DIAGNOSIS — R9389 Abnormal findings on diagnostic imaging of other specified body structures: Secondary | ICD-10-CM

## 2013-05-10 DIAGNOSIS — M25519 Pain in unspecified shoulder: Secondary | ICD-10-CM

## 2013-05-10 NOTE — Progress Notes (Signed)
Date:  05/10/2013   Name:  Morgan Hubbard   DOB:  1934/07/27   MRN:  [id number] Gender: female Age: 78 y.o.  Primary Physician:  [name], MD   Chief Complaint: Collar Bone Injury   Subjective:   History of Present Illness:  Morgan Hubbard is a 78 y.o. pleasant patient who presents with the following:  The patient is here today in the office in followup regarding her RIGHT sided shoulder injury that she sustained as described below. She rarely still is complaining of pain directly at the a.c. Joint, and last time about that she primarily had an a.c. Joint separation, with new onset prominence of her acromion compared to prior to her fall. Also suspect she had some significant amount of bone contusion, but could not fully exclude a rotator cuff tear.  She is here today and having more pain than expected, she is still in the sling gave her the last time she was in the office.  History is significant for 1 kidney. S/p donation of 1 kidney.  04/28/2013 OV: R shoulder pain: very pleasant patient with multiple medical problems he fell on the point of her shoulder she was at church on Sunday. She also banged her knee and hit her face as well. She has no changes in mentation. Now she has a bump on the top of her shoulder. She has no significant history of shoulder problems on that side and she has no history of prior operations. No history of significant fractures. Now she is having some significant pain and pain with abduction and pain with flexion. There is no bruising.  [name] going into church on Sunday and right shoulder. Has a knot on the right shoulder. 4 days ago. No prior injuries or shoulder.   [facility] joint  Patient Active Problem List   Diagnosis Date Noted  . Nonischemic cardiomyopathy 04/05/2013  . Shortness of breath 09/03/2012  . ETD (eustachian tube dysfunction) 06/16/2012  . Cerumen impaction 06/16/2012  . Gallstones 05/26/2012  . Elevated transaminase level 05/18/2012  .  Spinal stenosis of lumbar region 12/10/2011  . Mixed incontinence urge and stress 12/10/2011  . Renal insufficiency 11/04/2011  . Goiter 01/14/2011  . Tongue swelling 12/01/2010  . Fatty liver 08/28/2010  . DM 07/09/2010  . HYPERCHOLESTEROLEMIA 07/09/2010  . HYPERKALEMIA 07/09/2010  . REFLEX SYMPATHETIC DYSTROPHY 07/09/2010  . POLYNEUROPATHY 07/09/2010  . HYPERTENSION 07/09/2010  . CARDIOMYOPATHY 07/09/2010  . OVERACTIVE BLADDER 07/09/2010    Past Medical History  Diagnosis Date  . Arthritis   . Diabetes mellitus   . Headache(784.0)   . GERD (gastroesophageal reflux disease)   . Ulcer   . Hyperlipidemia   . Migraine   . Colon polyps   . Thyroid disease   . Shingles   . Urine incontinence   . Goiter past remote    treated with RI  . Other primary cardiomyopathies   . Hyperpotassemia   . Inflammatory and toxic neuropathy, unspecified   . Reflex sympathetic dystrophy, unspecified   . Hypertonicity of bladder   . Hypertension   . Shortness of breath     ambulation  . Migraine     hx of  . Vertigo     hx of  . Overactive bladder     Past Surgical History  Procedure Laterality Date  . Breast surgery  19599    breast cyst  . Finger surgery  [place]  . Tubal ligation  1976  .  Kidney donation  1989    left kidney  . Spine surgery  1999  . Ct scan  3/12    outside hosp- lung nodule and gallstones  . Upper gastrointestinal endoscopy    . Colonoscopy  7/12    normal (hx of polyps in past)   . Back surgery    . Eye surgery      cataract removal in left eye; several eye injections  . Lumbar laminectomy/decompression microdiscectomy  01/31/2012    Procedure: LUMBAR LAMINECTOMY/DECOMPRESSION MICRODISCECTOMY 2 LEVELS;  Surgeon: Eustace Moore, MD;  Location: [facility];  Service: Neurosurgery;  Laterality: N/A;  Thoracic twelve-lumbar one, lumbar one-two laminectomy   . Cardiac catheterization      Baldo Ash    History   Social History  . Marital Status:  Married    Spouse Name: N/A    Number of Children: 2  . Years of Education: N/A   Occupational History  . retired - Production manager     Social History Main Topics  . Smoking status: Never Smoker   . Smokeless tobacco: Never Used  . Alcohol Use: No  . Drug Use: No  . Sexual Activity: Not on file   Other Topics Concern  . Not on file   Social History Narrative   Married 2nd time   Daughter is [name]     Family History  Problem Relation Age of Onset  . Arthritis Mother   . Cancer Mother     uterine and mouth  . Hyperlipidemia Mother   . Stroke Mother   . Hypertension Mother   . Alcohol abuse Father   . Diabetes Father   . Cancer Sister     breast  . Diabetes Sister   . Cancer Brother     lung cancer  . Kidney disease Brother   . Diabetes Brother   . Hyperlipidemia Sister   . Heart disease Sister   . Hypertension Sister   . Diabetes Sister   . Hyperlipidemia Brother   . Kidney failure Brother   . Diabetes Daughter     Allergies  Allergen Reactions  . Ace Inhibitors Swelling    REACTION: tongue swelling  . Codeine Other (See Comments)    REACTION: nausea  . Lisinopril Swelling    Swelling of tongue  . Nsaids   . Percocet [Oxycodone-Acetaminophen]     Lips swell  . Tylenol [Acetaminophen]     Medication list has been reviewed and updated.  Review of Systems:  GEN: No fevers, chills. Nontoxic. Primarily MSK c/o today. MSK: Detailed in the HPI GI: tolerating PO intake without difficulty Neuro: No numbness, parasthesias, or tingling associated. Otherwise the pertinent positives of the ROS are noted above.   Objective:   Physical Examination: BP 140/80  Pulse 87  Temp(Src) 97.8 F (36.6 C) (Oral)  Ht 5\' 2"  (1.575 m)  Wt 156 lb 4 oz (70.875 kg)  BMI 28.57 kg/m2  LMP 06/03/1992  Ideal Body Weight: Weight in (lb) to have BMI = 25: 136.4   GEN: WDWN, NAD, Non-toxic, Alert & Oriented x 3 HEENT: Atraumatic, Normocephalic.  Ears and Nose:  No external deformity. EXTR: No clubbing/cyanosis/edema NEURO: Normal gait.  PSYCH: Normally interactive. Conversant. Not depressed or anxious appearing.  Calm demeanor.    The clavicle is nontender. All of the RIGHT side. At the a.c. Joint there is a more prominent acromion and the a.c. Ligament is tender to palpation at the a.c. Joint. She remains tender  directly in this area. Nontender in the bicipital groove. Nontender with compression of the humeral head and the length of the humerus. Nontender at the scapula.  Abduction is full passively and full flexion. There is some pain with active movement. Abduction strength is 4+/5 Flexion is 4+/5. Internal and external range of motion is 5/5.  All other special testing of the shoulder is equivocal given acute trauma, continued pain.  Dg Shoulder Right  04/28/2013   CLINICAL DATA:  Right shoulder pain after falling 4 days ago.  EXAM: RIGHT SHOULDER - 2+ VIEW  COMPARISON:  Chest radiographs 05/11/2012  FINDINGS: The mineralization and alignment are normal. There is no evidence of acute fracture or dislocation. The subacromial space is preserved. There are acromioclavicular and glenohumeral degenerative changes. There are faint calcifications superolateral to the humeral head, likely hydroxyapatite deposition within the distal rotator cuff. No discrete loose bodies identified. Degenerative changes are noted throughout the thoracic spine.  IMPRESSION: No acute osseous findings. Degenerative changes as described. Suspected calcific rotator cuff tendinosis.   Electronically Signed   By: Camie Patience M.D.   On: 04/28/2013 10:49   The radiological images were independently reviewed by myself in the office and results were reviewed with the patient. My independent interpretation of images:  There is a mild degree of glenohumeral joint osteoarthritic change. Joint space is relatively well preserved. Acromioclavicular joint shows extensive degenerative changes.  There is no occult fracture or dislocation. There does appear to be calcific changes in the supraspinatus.  Dg Clavicle Right  05/10/2013   CLINICAL DATA:  Right shoulder pain  EXAM: RIGHT CLAVICLE - 2+ VIEWS  COMPARISON:  04/28/2013  FINDINGS: No fracture or dislocation. As noted on the prior study, there are calcifications projecting along the lateral margin of the humeral head near the greater tuberosity suggesting calcific tendinitis of the rotator cuff. Mild AC joint osteoarthritis is noted.  There is irregularity of the visualized proximal humerus with areas of ill-defined lucency. This is similar to the prior exam. The possibility of a primitive bone lesion should be considered.  Bones are demineralized. The soft tissues are otherwise unremarkable. The underlying ribs are intact.  IMPRESSION: No fracture or acute finding. Findings again suggest rotator cuff calcific tendinitis. Mild AC joint osteoarthritis.  Possible permeative bone lesion of the proximal humerus. Recommend followup MRI with and without contrast or bone scan if this patient cannot tolerate MRI.   Electronically Signed   By: Lajean Manes M.D.   On: 05/10/2013 15:24    Assessment & Plan:    AC joint pain, right - Plan: DG Clavicle Right  Clavicle pain, right - Plan: DG Clavicle Right  Abnormal x-ray  Review anatomy. Given his limitations having a solitary kidney, avoid NSAIDs. She also has fatty liver disease, so recommended avoiding Tylenol. We have given her some tramadol for pain.  I reviewed basic range of motion again now to avoid frozen shoulder.  Likely all coming from acute a.c. Injury. Partial-thickness cuff tear cannot fully be excluded.  All images were reviewed, and the areas of ill-defined lucency were also seen by myself. I called one of the bone radiologist, and discussed potential renal impact from MRI with contrast versus bone scan. He suggested an MRI with and without contrast would be far preferable to  evaluate for potential neoplasm, and the risk of renal impairment is much lower compared to CT with contrast. Obtain and MRI with and without IV contrast to evaluate this area of question in  the upper humerus.  Patient Instructions  F/u 3 weeks with Dr. Lorelei Pont    Orders Today:  Orders Placed This Encounter  Procedures  . DG Clavicle Right    New medications, updates to list, dose adjustments: No orders of the defined types were placed in this encounter.    Signed,  Maud Deed. Arleny Kruger, MD, [place] at [facility] [place] [address] Phone: [phone] Fax: [phone]  Updated Complete Medication List:   Medication List       This list is accurate as of: 05/10/13 11:59 PM.  Always use your most recent med list.               aspirin 81 MG tablet  Take 81 mg by mouth daily.     carvedilol 12.5 MG tablet  Commonly known as:  COREG  Take 12.5 mg by mouth 2 (two) times daily with a meal.     cholecalciferol 1000 UNITS tablet  Commonly known as:  VITAMIN D  Take 1,000 Units by mouth daily.     EPIPEN 2-PAK IJ  Inject as directed as directed.     gabapentin 300 MG capsule  Commonly known as:  NEURONTIN  Take 1 capsule (300 mg total) by mouth 3 (three) times daily.     glimepiride 2 MG tablet  Commonly known as:  AMARYL  Take 1 tablet (2 mg total) by mouth 2 (two) times daily.     hydrALAZINE 25 MG tablet  Commonly known as:  APRESOLINE  Take 2 tablets (50 mg total) by mouth 3 (three) times daily.     HYDROcodone-acetaminophen 5-325 MG per tablet  Commonly known as:  NORCO/VICODIN  Take 1 tablet by mouth every 6 (six) hours as needed. For pain     isosorbide mononitrate 30 MG 24 hr tablet  Commonly known as:  IMDUR  Take 30 mg by mouth daily.     metFORMIN 500 MG 24 hr tablet  Commonly known as:  GLUCOPHAGE-XR  TAKE TWO TABLETS BY MOUTH TWICE DAILY     MILK THISTLE PO  Take 1 capsule by mouth daily.      [name] 50 MG Tb24 tablet  Generic drug:  mirabegron ER  Take by mouth daily.     nitroGLYCERIN 0.4 MG SL tablet  Commonly known as:  NITROSTAT  Place 0.4 mg under the tongue every 5 (five) minutes as needed. For chest pain     pantoprazole 40 MG tablet  Commonly known as:  PROTONIX  Take 1 tablet (40 mg total) by mouth daily.     sitaGLIPtin 100 MG tablet  Commonly known as:  JANUVIA  Take 100 mg by mouth daily.     traMADol 50 MG tablet  Commonly known as:  ULTRAM  Take 1 tablet (50 mg total) by mouth daily as needed.     vitamin B-12 1000 MCG tablet  Commonly known as:  CYANOCOBALAMIN  Take 1,000 mcg by mouth daily.

## 2013-05-10 NOTE — Patient Instructions (Signed)
F/u 3 weeks with Dr. Lorelei Pont

## 2013-05-10 NOTE — Progress Notes (Signed)
Pre-visit discussion using our clinic review tool. No additional management support is needed unless otherwise documented below in the visit note.  

## 2013-05-14 ENCOUNTER — Telehealth: Payer: Self-pay | Admitting: Family Medicine

## 2013-05-14 MED ORDER — LORAZEPAM 1 MG PO TABS: ORAL_TABLET | ORAL | Status: DC

## 2013-05-14 NOTE — Telephone Encounter (Signed)
Called to [facility] [address] 

## 2013-05-14 NOTE — Addendum Note (Signed)
Addended by: Carter Kitten on: 05/14/2013 10:40 AM   Modules accepted: Orders

## 2013-05-14 NOTE — Telephone Encounter (Signed)
Morgan Hubbard, please send in ativan 1 mg, 1 po 30 mins before MRI, may repeat if needed, #2, 0 refills  Dr C, I am having a difficult time getting Morgan Hubbard scheduled for the MRI. She can only go next Mon,Tues or Thurs and she is extremely claustrophobic and needs an open unit so she will need you to call her in some medication to keep her calm while doing the MRI. Are you OK with her going to [facility]? The first available one at [place] is 12/22 at 8:15pm in the open unit so I booked this one for her. I called [facility] and they can do the MRI next Thursday and also draw the lab, please advise if you want the [facility] and I will call them back to schedule her there. Thanks, Rosaria Ferries  --- Rosaria Ferries, [facility] is fine.   [name], MD 05/14/2013, 10:22 AM

## 2013-05-19 ENCOUNTER — Ambulatory Visit (INDEPENDENT_AMBULATORY_CARE_PROVIDER_SITE_OTHER): Payer: Medicare Other | Admitting: Family Medicine

## 2013-05-19 ENCOUNTER — Encounter: Payer: Self-pay | Admitting: Family Medicine

## 2013-05-19 VITALS — BP 122/76 | HR 79 | Temp 97.4°F | Ht 62.0 in | Wt 152.8 lb

## 2013-05-19 DIAGNOSIS — R7401 Elevation of levels of liver transaminase levels: Secondary | ICD-10-CM

## 2013-05-19 DIAGNOSIS — N289 Disorder of kidney and ureter, unspecified: Secondary | ICD-10-CM

## 2013-05-19 DIAGNOSIS — I10 Essential (primary) hypertension: Secondary | ICD-10-CM

## 2013-05-19 DIAGNOSIS — E875 Hyperkalemia: Secondary | ICD-10-CM

## 2013-05-19 LAB — HEPATIC FUNCTION PANEL
Albumin: 3.8 g/dL (ref 3.5–5.2)
Alkaline Phosphatase: 67 U/L (ref 39–117)
Total Bilirubin: 0.5 mg/dL (ref 0.3–1.2)

## 2013-05-19 LAB — RENAL FUNCTION PANEL
BUN: 11 mg/dL (ref 6–23)
CO2: 31 mEq/L (ref 19–32)
Calcium: 8.9 mg/dL (ref 8.4–10.5)
Chloride: 101 mEq/L (ref 96–112)
GFR: 67.2 mL/min (ref >60.00)
Sodium: 140 mEq/L (ref 135–145)

## 2013-05-19 NOTE — Progress Notes (Signed)
Pre-visit discussion using our clinic review tool. No additional management support is needed unless otherwise documented below in the visit note.  

## 2013-05-19 NOTE — Patient Instructions (Signed)
Take care of yourself  Blood pressure is improved today  Try to eat low fat and low sodium Avoid red meat/ fried foods/ egg yolks/ fatty breakfast meats/ butter, cheese and high fat dairy/ and shellfish     DASH Diet The DASH diet stands for "Dietary Approaches to Stop Hypertension." It is a healthy eating plan that has been shown to reduce high blood pressure (hypertension) in as little as 14 days, while also possibly providing other significant health benefits. These other health benefits include reducing the risk of breast cancer after menopause and reducing the risk of type 2 diabetes, heart disease, colon cancer, and stroke. Health benefits also include weight loss and slowing kidney failure in patients with chronic kidney disease.  DIET GUIDELINES  Limit salt (sodium). Your diet should contain less than 1500 mg of sodium daily.  Limit refined or processed carbohydrates. Your diet should include mostly whole grains. Desserts and added sugars should be used sparingly.  Include small amounts of heart-healthy fats. These types of fats include nuts, oils, and tub margarine. Limit saturated and trans fats. These fats have been shown to be harmful in the body. CHOOSING FOODS  The following food groups are based on a 2000 calorie diet. See your Registered Dietitian for individual calorie needs. Grains and Grain Products (6 to 8 servings daily)  Eat More Often: Whole-wheat bread, brown rice, whole-grain or wheat pasta, quinoa, popcorn without added fat or salt (air popped).  Eat Less Often: White bread, white pasta, white rice, cornbread. Vegetables (4 to 5 servings daily)  Eat More Often: Fresh, frozen, and canned vegetables. Vegetables may be raw, steamed, roasted, or grilled with a minimal amount of fat.  Eat Less Often/Avoid: Creamed or fried vegetables. Vegetables in a cheese sauce. Fruit (4 to 5 servings daily)  Eat More Often: All fresh, canned (in natural juice), or frozen fruits.  Dried fruits without added sugar. One hundred percent fruit juice ( cup [237 mL] daily).  Eat Less Often: Dried fruits with added sugar. Canned fruit in light or heavy syrup. [employer], Fish, and Poultry (2 servings or less daily. One serving is 3 to 4 oz [85-114 g]).  Eat More Often: Ninety percent or leaner ground beef, tenderloin, sirloin. Round cuts of beef, chicken breast, [place] breast. All fish. Grill, bake, or broil your meat. Nothing should be fried.  Eat Less Often/Avoid: Fatty cuts of meat, [place], or chicken leg, thigh, or wing. Fried cuts of meat or fish. Dairy (2 to 3 servings)  Eat More Often: Low-fat or fat-free milk, low-fat plain or light yogurt, reduced-fat or part-skim cheese.  Eat Less Often/Avoid: Milk (whole, 2%).Whole milk yogurt. Full-fat cheeses. Nuts, Seeds, and Legumes (4 to 5 servings per week)  Eat More Often: All without added salt.  Eat Less Often/Avoid: Salted nuts and seeds, canned beans with added salt. Fats and Sweets (limited)  Eat More Often: Vegetable oils, tub margarines without trans fats, sugar-free gelatin. Mayonnaise and salad dressings.  Eat Less Often/Avoid: Coconut oils, palm oils, butter, stick margarine, cream, half and half, cookies, candy, pie. FOR MORE INFORMATION The Dash Diet Eating Plan: www.dashdiet.org Document Released: 05/09/2011 Document Revised: 08/12/2011 Document Reviewed: 05/09/2011 [facility] Patient Information 2014 [place], [place].

## 2013-05-19 NOTE — Progress Notes (Signed)
Subjective:    Patient ID: Morgan Hubbard, female    DOB: [birth year], 78 y.o.   MRN: [id number]  HPI Here for f/u of chronic health problems  Has had a lot of pain issues  Is going through a lot of problems after a fall on R shoulder - very painful  Also found a "spot" on her shoulder  Had her MRI Monday - will be following up with Dr Lorelei Pont to discuss pain management as well   [name] due to baseline  weakness in L leg after back surgery She would consider using cane or walker now -long discussion about that   Seeing Dr Chalmers Cater for her DM-now on januvia (she cannot afford it for now)  Will see her in Jan and overall doing much better - sugars are down below 150 now   bp is stable today  No cp or palpitations or headaches or edema  No side effects to medicines  Changed from amoldipine to hydralazine  BP Readings from Last 3 Encounters:  05/19/13 122/76  05/10/13 140/80  05/04/13 162/84    Cardiology has been working hard to get this controlled in light of cardiomyopathy   Will have a heart MRI in Jan - due to low EF found by Dr Rockey Situ (she will see Dr Leonides Schanz her cardiologist there as well)  She is short winded with even very little exertion - so she cannot get much exercise   Hx of hyperkalemia and renal insuff   Chemistry      Component Value Date/Time   NA 138 03/02/2013 0912   K 4.1 03/02/2013 0912   CL 102 03/02/2013 0912   CO2 31 03/02/2013 0912   BUN 15 03/02/2013 0912   CREATININE 0.9 03/02/2013 0912      Component Value Date/Time   CALCIUM 9.2 03/02/2013 0912   ALKPHOS 140* 03/02/2013 0912   AST 32 03/02/2013 0912   ALT 94* 03/02/2013 0912   BILITOT 0.9 03/02/2013 0912      Also has hx of elevated liver enzymes - taking milk thistle lately - and this has seemed to help Lab Results  Component Value Date   ALT 94* 03/02/2013   AST 32 03/02/2013   ALKPHOS 140* 03/02/2013   BILITOT 0.9 03/02/2013    Hx of hyperlipiedmia  Lab Results  Component Value Date   CHOL 239*  03/02/2013   HDL 33.80* 03/02/2013   LDLCALC 105* 10/15/2010   LDLDIRECT 151.3 03/02/2013   TRIG 297.0* 03/02/2013   CHOLHDL 7 03/02/2013    Trying to eat a low sat fat diet-not always perfect   Patient Active Problem List   Diagnosis Date Noted  . Nonischemic cardiomyopathy 04/05/2013  . Shortness of breath 09/03/2012  . ETD (eustachian tube dysfunction) 06/16/2012  . Cerumen impaction 06/16/2012  . Gallstones 05/26/2012  . Elevated transaminase level 05/18/2012  . Spinal stenosis of lumbar region 12/10/2011  . Mixed incontinence urge and stress 12/10/2011  . Renal insufficiency 11/04/2011  . Goiter 01/14/2011  . Tongue swelling 12/01/2010  . Fatty liver 08/28/2010  . DM 07/09/2010  . HYPERCHOLESTEROLEMIA 07/09/2010  . HYPERKALEMIA 07/09/2010  . REFLEX SYMPATHETIC DYSTROPHY 07/09/2010  . POLYNEUROPATHY 07/09/2010  . HYPERTENSION 07/09/2010  . CARDIOMYOPATHY 07/09/2010  . OVERACTIVE BLADDER 07/09/2010   Past Medical History  Diagnosis Date  . Arthritis   . Diabetes mellitus   . Headache(784.0)   . GERD (gastroesophageal reflux disease)   . Ulcer   . Hyperlipidemia   . Migraine   .  Colon polyps   . Thyroid disease   . Shingles   . Urine incontinence   . Goiter past remote    treated with RI  . Other primary cardiomyopathies   . Hyperpotassemia   . Inflammatory and toxic neuropathy, unspecified   . Reflex sympathetic dystrophy, unspecified   . Hypertonicity of bladder   . Hypertension   . Shortness of breath     ambulation  . Migraine     hx of  . Vertigo     hx of  . Overactive bladder    Past Surgical History  Procedure Laterality Date  . Breast surgery  19599    breast cyst  . Finger surgery  [place]  . Tubal ligation  1976  . Kidney donation  1989    left kidney  . Spine surgery  1999  . Ct scan  3/12    outside hosp- lung nodule and gallstones  . Upper gastrointestinal endoscopy    . Colonoscopy  7/12    normal (hx of polyps in past)   .  Back surgery    . Eye surgery      cataract removal in left eye; several eye injections  . Lumbar laminectomy/decompression microdiscectomy  01/31/2012    Procedure: LUMBAR LAMINECTOMY/DECOMPRESSION MICRODISCECTOMY 2 LEVELS;  Surgeon: Eustace Moore, MD;  Location: [facility];  Service: Neurosurgery;  Laterality: N/A;  Thoracic twelve-lumbar one, lumbar one-two laminectomy   . Cardiac catheterization      [place]   History  Substance Use Topics  . Smoking status: Never Smoker   . Smokeless tobacco: Never Used  . Alcohol Use: No   Family History  Problem Relation Age of Onset  . Arthritis Mother   . Cancer Mother     uterine and mouth  . Hyperlipidemia Mother   . Stroke Mother   . Hypertension Mother   . Alcohol abuse Father   . Diabetes Father   . Cancer Sister     breast  . Diabetes Sister   . Cancer Brother     lung cancer  . Kidney disease Brother   . Diabetes Brother   . Hyperlipidemia Sister   . Heart disease Sister   . Hypertension Sister   . Diabetes Sister   . Hyperlipidemia Brother   . Kidney failure Brother   . Diabetes Daughter    Allergies  Allergen Reactions  . Ace Inhibitors Swelling    REACTION: tongue swelling  . Codeine Other (See Comments)    REACTION: nausea  . Lisinopril Swelling    Swelling of tongue  . Nsaids   . Tylenol [Acetaminophen]    Current Outpatient Prescriptions on File Prior to Visit  Medication Sig Dispense Refill  . aspirin 81 MG tablet Take 81 mg by mouth daily.        . carvedilol (COREG) 12.5 MG tablet Take 12.5 mg by mouth 2 (two) times daily with a meal.        . cholecalciferol (VITAMIN D) 1000 UNITS tablet Take 1,000 Units by mouth daily.      Marland Kitchen EPINEPHrine (EPIPEN 2-PAK IJ) Inject as directed as directed.       . gabapentin (NEURONTIN) 300 MG capsule Take 1 capsule (300 mg total) by mouth 3 (three) times daily.  270 capsule  1  . glimepiride (AMARYL) 2 MG tablet Take 1 tablet (2 mg total) by mouth 2 (two) times  daily.  180 tablet  1  . hydrALAZINE (APRESOLINE)  25 MG tablet Take 2 tablets (50 mg total) by mouth 3 (three) times daily.  180 tablet  6  . HYDROcodone-acetaminophen (NORCO/VICODIN) 5-325 MG per tablet Take 1 tablet by mouth every 6 (six) hours as needed. For pain      . isosorbide mononitrate (IMDUR) 30 MG 24 hr tablet Take 30 mg by mouth daily.      Marland Kitchen LORazepam (ATIVAN) 1 MG tablet Take one tablet by mouth 30 minutes prior to MRI.  May repeat if needed.  2 tablet  0  . metFORMIN (GLUCOPHAGE-XR) 500 MG 24 hr tablet TAKE TWO TABLETS BY MOUTH TWICE DAILY  360 tablet  0  . MILK THISTLE PO Take 1 capsule by mouth daily.      . mirabegron ER ([name]) 50 MG TB24 Take by mouth daily.      . nitroGLYCERIN (NITROSTAT) 0.4 MG SL tablet Place 0.4 mg under the tongue every 5 (five) minutes as needed. For chest pain      . pantoprazole (PROTONIX) 40 MG tablet Take 1 tablet (40 mg total) by mouth daily.  90 tablet  1  . sitaGLIPtin (JANUVIA) 100 MG tablet Take 100 mg by mouth daily.       . traMADol (ULTRAM) 50 MG tablet Take 1 tablet (50 mg total) by mouth daily as needed.  40 tablet  2  . vitamin B-12 (CYANOCOBALAMIN) 1000 MCG tablet Take 1,000 mcg by mouth daily.       No current facility-administered medications on file prior to visit.     Review of Systems Review of Systems  Constitutional: Negative for fever, appetite change, fatigue and unexpected weight change.  Eyes: Negative for pain and visual disturbance.  Respiratory: Negative for cough and pos for sob with exertion  Cardiovascular: Negative for cp or palpitations    Gastrointestinal: Negative for nausea, diarrhea and constipation.  Genitourinary: Negative for urgency and frequency.  Skin: Negative for pallor or rash   MSK pos for moderate to severe shoulder pain Neurological: Negative for weakness, light-headedness, numbness and headaches.  Hematological: Negative for adenopathy. Does not bruise/bleed easily.    Psychiatric/Behavioral: Negative for dysphoric mood. The patient is not nervous/anxious.         Objective:   Physical Exam  Constitutional: She appears well-developed and well-nourished. No distress.  overwt and well appearing   HENT:  Head: Normocephalic.  Mouth/Throat: Oropharynx is clear and moist.  Eyes: Conjunctivae and EOM are normal. Pupils are equal, round, and reactive to light. Right eye exhibits no discharge. Left eye exhibits no discharge. No scleral icterus.  Neck: Normal range of motion. Neck supple. No JVD present. No thyromegaly present.  Cardiovascular: Normal rate, regular rhythm and intact distal pulses.   Pulmonary/Chest: Effort normal and breath sounds normal. No respiratory distress. She has no wheezes. She has no rales.  No crackles   Abdominal: Soft. Bowel sounds are normal. She exhibits no distension, no abdominal bruit and no mass. There is no tenderness.  Musculoskeletal: She exhibits no edema.  Poor rom of R shoulder  Lymphadenopathy:    She has no cervical adenopathy.  Neurological: She is alert. She has normal reflexes. No cranial nerve deficit. She exhibits normal muscle tone. Coordination normal.  Skin: Skin is warm and dry. No rash noted. No erythema. No pallor.  Psychiatric: She has a normal mood and affect.          Assessment & Plan:

## 2013-05-20 NOTE — Assessment & Plan Note (Signed)
BP: 122/76 mmHg  Better with hydralazine  In setting of cardiomyopathy- pend further w/u at [facility] bp in fair control at this time  No changes needed Disc lifstyle change with low sodium diet and exercise

## 2013-05-20 NOTE — Assessment & Plan Note (Signed)
Lab today  In setting of renal insuff She has not needed kayexelate lately

## 2013-05-20 NOTE — Assessment & Plan Note (Signed)
Lab today  Pt avoids renal toxic meds

## 2013-05-20 NOTE — Assessment & Plan Note (Signed)
In past  Re check today Improved per pt since she started taking milk thistle  Will have to watch carefully if she starts back on vicodin (due to acetaminophen)- this will not be long term

## 2013-05-24 ENCOUNTER — Other Ambulatory Visit: Payer: Medicare Other

## 2013-05-28 ENCOUNTER — Other Ambulatory Visit: Payer: Self-pay | Admitting: *Deleted

## 2013-05-28 MED ORDER — PANTOPRAZOLE SODIUM 40 MG PO TBEC: 40.0000 mg | DELAYED_RELEASE_TABLET | Freq: Every day | ORAL | Status: DC

## 2013-05-28 MED ORDER — GLIMEPIRIDE 2 MG PO TABS: 2.0000 mg | ORAL_TABLET | Freq: Two times a day (BID) | ORAL | Status: DC

## 2013-06-07 ENCOUNTER — Encounter: Payer: Self-pay | Admitting: Family Medicine

## 2013-06-07 ENCOUNTER — Ambulatory Visit (INDEPENDENT_AMBULATORY_CARE_PROVIDER_SITE_OTHER): Payer: Medicare Other | Admitting: Family Medicine

## 2013-06-07 VITALS — BP 124/70 | HR 75 | Temp 97.8°F | Ht 62.0 in | Wt 155.8 lb

## 2013-06-07 DIAGNOSIS — M75100 Unspecified rotator cuff tear or rupture of unspecified shoulder, not specified as traumatic: Secondary | ICD-10-CM

## 2013-06-07 DIAGNOSIS — M719 Bursopathy, unspecified: Secondary | ICD-10-CM

## 2013-06-07 DIAGNOSIS — M7581 Other shoulder lesions, right shoulder: Secondary | ICD-10-CM

## 2013-06-07 DIAGNOSIS — M7551 Bursitis of right shoulder: Secondary | ICD-10-CM

## 2013-06-07 DIAGNOSIS — M67919 Unspecified disorder of synovium and tendon, unspecified shoulder: Secondary | ICD-10-CM

## 2013-06-07 DIAGNOSIS — IMO0002 Reserved for concepts with insufficient information to code with codable children: Secondary | ICD-10-CM

## 2013-06-07 NOTE — Progress Notes (Signed)
Pre-visit discussion using our clinic review tool. No additional management support is needed unless otherwise documented below in the visit note.  

## 2013-06-08 NOTE — Telephone Encounter (Signed)
I have updated Mammogram date, just sent to you as an FYI about her MRI date

## 2013-06-12 NOTE — Progress Notes (Signed)
Date:  06/07/2013   Name:  Morgan Hubbard   DOB:  15-Nov-1934   MRN:  [id number] Gender: female Age: 79 y.o.  Primary Physician:  Morgan Pardon, MD   Chief Complaint: Follow-up   Subjective:   History of Present Illness:  Morgan Hubbard is a 79 y.o. pleasant patient who presents with the following:  F/u post trauma and MRI, no RTC tear with extensive tendinopathy and subac bursitis. Decreased pain at [facility].  Prior OV: The patient is here today in the office in followup regarding her RIGHT sided shoulder injury that she sustained as described below. She rarely still is complaining of pain directly at the a.c. Joint, and last time about that she primarily had an a.c. Joint separation, with new onset prominence of her acromion compared to prior to her fall. Also suspect she had some significant amount of bone contusion, but could not fully exclude a rotator cuff tear.  She is here today and having more pain than expected, she is still in the sling gave her the last time she was in the office.  History is significant for 1 kidney. S/p donation of 1 kidney.  04/28/2013 OV: R shoulder pain: very pleasant patient with multiple medical problems he fell on the point of her shoulder she was at church on Sunday. She also banged her knee and hit her face as well. She has no changes in mentation. Now she has a bump on the top of her shoulder. She has no significant history of shoulder problems on that side and she has no history of prior operations. No history of significant fractures. Now she is having some significant pain and pain with abduction and pain with flexion. There is no bruising.  [name] going into church on Sunday and right shoulder. Has a knot on the right shoulder. 4 days ago. No prior injuries or shoulder.   [place] joint  Patient Active Problem List   Diagnosis Date Noted  . Nonischemic cardiomyopathy 04/05/2013  . Shortness of breath 09/03/2012  . ETD (eustachian tube dysfunction)  06/16/2012  . Cerumen impaction 06/16/2012  . Gallstones 05/26/2012  . Elevated transaminase level 05/18/2012  . Spinal stenosis of lumbar region 12/10/2011  . Mixed incontinence urge and stress 12/10/2011  . Renal insufficiency 11/04/2011  . Goiter 01/14/2011  . Tongue swelling 12/01/2010  . Fatty liver 08/28/2010  . DM 07/09/2010  . HYPERCHOLESTEROLEMIA 07/09/2010  . HYPERKALEMIA 07/09/2010  . REFLEX SYMPATHETIC DYSTROPHY 07/09/2010  . POLYNEUROPATHY 07/09/2010  . HYPERTENSION 07/09/2010  . CARDIOMYOPATHY 07/09/2010  . OVERACTIVE BLADDER 07/09/2010    Past Medical History  Diagnosis Date  . Arthritis   . Diabetes mellitus   . Headache(784.0)   . GERD (gastroesophageal reflux disease)   . Ulcer   . Hyperlipidemia   . Migraine   . Colon polyps   . Thyroid disease   . Shingles   . Urine incontinence   . Goiter past remote    treated with RI  . Other primary cardiomyopathies   . Hyperpotassemia   . Inflammatory and toxic neuropathy, unspecified   . Reflex sympathetic dystrophy, unspecified   . Hypertonicity of bladder   . Hypertension   . Shortness of breath     ambulation  . Migraine     hx of  . Vertigo     hx of  . Overactive bladder     Past Surgical History  Procedure Laterality Date  . Breast surgery  [phone]  breast cyst  . Finger surgery  [place]  . Tubal ligation  1976  . Kidney donation  1989    left kidney  . Spine surgery  1999  . Ct scan  3/12    outside hosp- lung nodule and gallstones  . Upper gastrointestinal endoscopy    . Colonoscopy  7/12    normal (hx of polyps in past)   . Back surgery    . Eye surgery      cataract removal in left eye; several eye injections  . Lumbar laminectomy/decompression microdiscectomy  01/31/2012    Procedure: LUMBAR LAMINECTOMY/DECOMPRESSION MICRODISCECTOMY 2 LEVELS;  Surgeon: Morgan Moore, MD;  Location: [facility];  Service: Neurosurgery;  Laterality: N/A;  Thoracic twelve-lumbar one, lumbar  one-two laminectomy   . Cardiac catheterization      Morgan Hubbard    History   Social History  . Marital Status: Married    Spouse Name: N/A    Number of Children: 2  . Years of Education: N/A   Occupational History  . retired - Production manager     Social History Main Topics  . Smoking status: Never Smoker   . Smokeless tobacco: Never Used  . Alcohol Use: No  . Drug Use: No  . Sexual Activity: Not on file   Other Topics Concern  . Not on file   Social History Narrative   Married 2nd time   Daughter is Morgan Hubbard     Family History  Problem Relation Age of Onset  . Arthritis Mother   . Cancer Mother     uterine and mouth  . Hyperlipidemia Mother   . Stroke Mother   . Hypertension Mother   . Alcohol abuse Father   . Diabetes Father   . Cancer Sister     breast  . Diabetes Sister   . Cancer Brother     lung cancer  . Kidney disease Brother   . Diabetes Brother   . Hyperlipidemia Sister   . Heart disease Sister   . Hypertension Sister   . Diabetes Sister   . Hyperlipidemia Brother   . Kidney failure Brother   . Diabetes Daughter     Allergies  Allergen Reactions  . Ace Inhibitors Swelling    REACTION: tongue swelling  . Codeine Other (See Comments)    REACTION: nausea  . Lisinopril Swelling    Swelling of tongue  . Nsaids   . Tylenol [Acetaminophen]     Medication list has been reviewed and updated.  Review of Systems:  GEN: No fevers, chills. Nontoxic. Primarily MSK c/o today. MSK: Detailed in the HPI GI: tolerating PO intake without difficulty Neuro: No numbness, parasthesias, or tingling associated. Otherwise the pertinent positives of the ROS are noted above.   Objective:   Physical Examination: BP 124/70  Pulse 75  Temp(Src) 97.8 F (36.6 C) (Oral)  Ht 5\' 2"  (1.575 m)  Wt 155 lb 12 oz (70.648 kg)  BMI 28.48 kg/m2  LMP 06/03/1992  Ideal Body Weight: Weight in (lb) to have BMI = 25: 136.4   GEN: WDWN, NAD, Non-toxic, Alert &  Oriented x 3 HEENT: Atraumatic, Normocephalic.  Ears and Nose: No external deformity. EXTR: No clubbing/cyanosis/edema NEURO: Normal gait.  PSYCH: Normally interactive. Conversant. Not depressed or anxious appearing.  Calm demeanor.    The clavicle is nontender. All of the RIGHT side. At the a.c. Joint there is a more prominent acromion and the a.c. Ligament is tender to  palpation at the a.c. Joint. She remains tender directly in this area. Nontender in the bicipital groove. Nontender with compression of the humeral head and the length of the humerus. Nontender at the scapula.  Abduction is full passively and full flexion. There is some pain with active movement. Abduction strength is 4+/5 Flexion is 4+/5. Internal and external range of motion is 5/5.  Hawkins and neer pos Speeds and yerg neg  Dg Shoulder Right  04/28/2013   CLINICAL DATA:  Right shoulder pain after falling 4 days ago.  EXAM: RIGHT SHOULDER - 2+ VIEW  COMPARISON:  Chest radiographs 05/11/2012  FINDINGS: The mineralization and alignment are normal. There is no evidence of acute fracture or dislocation. The subacromial space is preserved. There are acromioclavicular and glenohumeral degenerative changes. There are faint calcifications superolateral to the humeral head, likely hydroxyapatite deposition within the distal rotator cuff. No discrete loose bodies identified. Degenerative changes are noted throughout the thoracic spine.  IMPRESSION: No acute osseous findings. Degenerative changes as described. Suspected calcific rotator cuff tendinosis.   Electronically Signed   By: Camie Patience M.D.   On: 04/28/2013 10:49   The radiological images were independently reviewed by myself in the office and results were reviewed with the patient. My independent interpretation of images:  There is a mild degree of glenohumeral joint osteoarthritic change. Joint space is relatively well preserved. Acromioclavicular joint shows extensive  degenerative changes. There is no occult fracture or dislocation. There does appear to be calcific changes in the supraspinatus.  No results found.  Assessment & Plan:   Rotator cuff tendonitis, right  Subacromial bursitis, right   Proceed with beginning rehab, inject shoulder. She is improving, but she is a little frustrated. Initiate rehabilitation and follow up in 6-8 weeks.  SubAC Injection, R Verbal consent was obtained from the patient. Risks (including rare infection), benefits, and alternatives were explained. Patient prepped with Chloraprep and Ethyl Chloride used for anesthesia. The subacromial space was injected using the posterior approach with 1/2 inj and 1/2 volume instilled into the intraarticular space. The patient tolerated the procedure well and had decreased pain post injection. No complications. Injection: 8 cc of Lidocaine 1% and 2 cc of Depo-Medrol 40 mg. Needle: 22 gauge  Signed,  Aziah Kaiser T. Sorcha Rotunno, MD, [place] at [facility] [place] [address] Phone: [phone] Fax: [phone]  Updated Complete Medication List:   Medication List       This list is accurate as of: 06/07/13 11:59 PM.  Always use your most recent med list.               aspirin 81 MG tablet  Take 81 mg by mouth daily.     carvedilol 12.5 MG tablet  Commonly known as:  COREG  Take 12.5 mg by mouth 2 (two) times daily with a meal.     cholecalciferol 1000 UNITS tablet  Commonly known as:  VITAMIN D  Take 1,000 Units by mouth daily.     EPIPEN 2-PAK IJ  Inject as directed as directed.     gabapentin 300 MG capsule  Commonly known as:  NEURONTIN  Take 1 capsule (300 mg total) by mouth 3 (three) times daily.     glimepiride 2 MG tablet  Commonly known as:  AMARYL  Take 1 tablet (2 mg total) by mouth 2 (two) times daily.     hydrALAZINE 25 MG tablet  Commonly known as:  APRESOLINE  Take 2 tablets (50 mg total) by mouth 3  (three) times daily.     HYDROcodone-acetaminophen 5-325 MG per tablet  Commonly known as:  NORCO/VICODIN  Take 1 tablet by mouth every 6 (six) hours as needed. For pain     isosorbide mononitrate 30 MG 24 hr tablet  Commonly known as:  IMDUR  Take 30 mg by mouth daily.     LORazepam 1 MG tablet  Commonly known as:  ATIVAN  Take one tablet by mouth 30 minutes prior to MRI.  May repeat if needed.     metFORMIN 500 MG 24 hr tablet  Commonly known as:  GLUCOPHAGE-XR  TAKE TWO TABLETS BY MOUTH TWICE DAILY     MILK THISTLE PO  Take 1 capsule by mouth daily.     [name] 50 MG Tb24 tablet  Generic drug:  mirabegron ER  Take by mouth daily.     nitroGLYCERIN 0.4 MG SL tablet  Commonly known as:  NITROSTAT  Place 0.4 mg under the tongue every 5 (five) minutes as needed. For chest pain     pantoprazole 40 MG tablet  Commonly known as:  PROTONIX  Take 1 tablet (40 mg total) by mouth daily.     sitaGLIPtin 100 MG tablet  Commonly known as:  JANUVIA  Take 100 mg by mouth daily.     traMADol 50 MG tablet  Commonly known as:  ULTRAM  Take 1 tablet (50 mg total) by mouth daily as needed.     vitamin B-12 1000 MCG tablet  Commonly known as:  CYANOCOBALAMIN  Take 1,000 mcg by mouth daily.

## 2013-06-21 ENCOUNTER — Encounter: Payer: Self-pay | Admitting: Family Medicine

## 2013-08-03 ENCOUNTER — Encounter: Payer: Self-pay | Admitting: Cardiovascular Disease

## 2013-08-03 ENCOUNTER — Ambulatory Visit: Payer: Medicare Other | Admitting: Cardiovascular Disease

## 2013-08-03 ENCOUNTER — Ambulatory Visit (INDEPENDENT_AMBULATORY_CARE_PROVIDER_SITE_OTHER): Payer: Medicare Other | Admitting: Cardiovascular Disease

## 2013-08-03 VITALS — BP 140/64 | HR 69 | Ht 62.0 in | Wt 159.2 lb

## 2013-08-03 DIAGNOSIS — I10 Essential (primary) hypertension: Secondary | ICD-10-CM

## 2013-08-03 DIAGNOSIS — R0602 Shortness of breath: Secondary | ICD-10-CM

## 2013-08-03 DIAGNOSIS — E119 Type 2 diabetes mellitus without complications: Secondary | ICD-10-CM

## 2013-08-03 DIAGNOSIS — I428 Other cardiomyopathies: Secondary | ICD-10-CM

## 2013-08-03 MED ORDER — CARVEDILOL 12.5 MG PO TABS
12.5000 mg | ORAL_TABLET | Freq: Two times a day (BID) | ORAL | Status: DC
Start: 2013-08-03 — End: 2021-03-26

## 2013-08-03 MED ORDER — HYDRALAZINE HCL 25 MG PO TABS: 50.0000 mg | ORAL_TABLET | Freq: Three times a day (TID) | ORAL | Status: DC

## 2013-08-03 MED ORDER — ISOSORBIDE MONONITRATE ER 30 MG PO TB24: 30.0000 mg | ORAL_TABLET | Freq: Every day | ORAL | Status: DC

## 2013-08-03 NOTE — Assessment & Plan Note (Signed)
We have encouraged continued exercise, careful diet management in an effort to lose weight. 

## 2013-08-03 NOTE — Progress Notes (Signed)
Patient ID: Morgan Hubbard, female    DOB: 22-Jan-1935, 79 y.o.   MRN: [id number]  HPI Comments: Morgan Hubbard is a very pleasant 79 year old woman with past medical history of nonischemic cardiomyopathy, prior ejection fraction of [id number] in [id number] of uncertain etiology , initially improved with medication,  slight drop after changes to her medications, had significant improvement back to ejection fraction greater than 45%, history of left bundle branch block, nephrectomy/donated kidney in 1989, possible angioedema on lisinopril, back surgery x3, poorly controlled diabetes, previously seen for shortness of breath. She presents for routine followup  summer 2014, she had worsening fatigue, shortness of breath with exertion. Conditioning was felt to be an issue. Also with severe back disease she has significant neuropathy in her legs. She has not been taking Lasix as she has overactive bladder. She is working with endocrine for her diabetes.   statin was held for elevated LFTs (I believe she was on pravastatin 80 mg). Cholesterol climbed up to 240  Echocardiogram was done for her fatigue and shortness of breath.  ejection fraction of 25-30% which was significant drop from prior ejection fraction February 2013 (45%) done at [facility].  normal RVSP.  In followup, she was started on hydralazine 25 mg 3 times a day, and titrated up to 50 mg 3 times a day. She did not want an ACE or ARB as she did not want to add medications.  In followup today, she had MRI at [facility] with report detailing ejection fraction 48% She feels better but still has significant shortness of breath with exertion. She is interested in cardiac rehabilitation. She does not do any regular exercise. She does not feel the need for Lasix though occasionally has minimal leg edema  Prior cardiac catheterization 08/30/2010 in [place]  showed no significant coronary artery disease  EKG shows normal sinus rhythm with rate 69 and beats per minute with  left bundle branch block    Outpatient Encounter Prescriptions as of 08/03/2013  Medication Sig  . aspirin 81 MG tablet Take 81 mg by mouth daily.    . carvedilol (COREG) 12.5 MG tablet Take 12.5 mg by mouth 2 (two) times daily with a meal.    . cholecalciferol (VITAMIN D) 1000 UNITS tablet Take 1,000 Units by mouth daily.  Marland Kitchen gabapentin (NEURONTIN) 300 MG capsule Take 1 capsule (300 mg total) by mouth 3 (three) times daily.  Marland Kitchen glimepiride (AMARYL) 2 MG tablet Take 1 tablet (2 mg total) by mouth 2 (two) times daily.  . hydrALAZINE (APRESOLINE) 25 MG tablet Take 2 tablets (50 mg total) by mouth 3 (three) times daily.  Marland Kitchen HYDROcodone-acetaminophen (NORCO/VICODIN) 5-325 MG per tablet Take 1 tablet by mouth every 6 (six) hours as needed. For pain  . isosorbide mononitrate (IMDUR) 30 MG 24 hr tablet Take 30 mg by mouth daily.  . metFORMIN (GLUCOPHAGE-XR) 500 MG 24 hr tablet TAKE TWO TABLETS BY MOUTH TWICE DAILY  . MILK THISTLE PO Take 1 capsule by mouth daily.  . mirabegron ER ([name]) 50 MG TB24 Take by mouth daily.  . pantoprazole (PROTONIX) 40 MG tablet Take 1 tablet (40 mg total) by mouth daily.  . traMADol (ULTRAM) 50 MG tablet Take 1 tablet (50 mg total) by mouth daily as needed.  . vitamin B-12 (CYANOCOBALAMIN) 1000 MCG tablet Take 1,000 mcg by mouth daily.   Review of Systems  Constitutional: Negative.   HENT: Negative.   Eyes: Negative.   Respiratory: Positive for shortness of breath.  Cardiovascular: Negative.   Gastrointestinal: Negative.   Endocrine: Negative.   Musculoskeletal: Positive for arthralgias.  Skin: Negative.   Allergic/Immunologic: Negative.   Neurological: Negative.   Hematological: Negative.   Psychiatric/Behavioral: Negative.   All other systems reviewed and are negative.    BP 140/64  Pulse 69  Ht 5\' 2"  (1.575 m)  Wt 159 lb 4 oz (72.235 kg)  BMI 29.12 kg/m2  LMP 06/03/1992  Physical Exam  Nursing note and vitals reviewed. Constitutional: She is  oriented to person, place, and time. She appears well-developed and well-nourished.  HENT:  Head: Normocephalic.  Nose: Nose normal.  Mouth/Throat: Oropharynx is clear and moist.  Eyes: Conjunctivae are normal. Pupils are equal, round, and reactive to light.  Neck: Normal range of motion. Neck supple. No JVD present.  Cardiovascular: Normal rate, regular rhythm, S1 normal, S2 normal, normal heart sounds and intact distal pulses.  Exam reveals no gallop and no friction rub.   No murmur heard. Pulmonary/Chest: Effort normal and breath sounds normal. No respiratory distress. She has no wheezes. She has no rales. She exhibits no tenderness.  Abdominal: Soft. Bowel sounds are normal. She exhibits no distension. There is no tenderness.  Musculoskeletal: Normal range of motion. She exhibits no edema and no tenderness.  Lymphadenopathy:    She has no cervical adenopathy.  Neurological: She is alert and oriented to person, place, and time. Coordination normal.  Skin: Skin is warm and dry. No rash noted. No erythema.  Psychiatric: She has a normal mood and affect. Her behavior is normal. Judgment and thought content normal.    Assessment and Plan

## 2013-08-03 NOTE — Assessment & Plan Note (Signed)
Blood pressure is well controlled on today's visit. No changes made to the medications. 

## 2013-08-03 NOTE — Assessment & Plan Note (Signed)
This has been a chronic issue which I attributed to deconditioning, obesity, cardiomyopathy. We will send an order for cardiac rehabilitation

## 2013-08-03 NOTE — Patient Instructions (Signed)
You are doing well. No medication changes were made.  We will order cardiac rehab at [facility]  Please call us if you have new issues that need to be addressed before your next appt.  Your physician wants you to follow-up in: 6 months.  You will receive a reminder letter in the mail two months in advance. If you don't receive a letter, please call our office to schedule the follow-up appointment.

## 2013-08-03 NOTE — Assessment & Plan Note (Signed)
Nonischemic cardiomyopathy. Recent MRI at [facility] showing ejection fraction 48%, prior echocardiogram with ejection fraction 25-30% prior to restarting hydralazine

## 2013-09-08 ENCOUNTER — Telehealth: Payer: Self-pay | Admitting: *Deleted

## 2013-09-08 NOTE — Telephone Encounter (Signed)
Patient called and would like for her cardiac rehab form signed so she can exercise. Please do this ASAP

## 2013-09-08 NOTE — Telephone Encounter (Signed)
Paperwork faxed to [facility] for pt to participate in pulmonary rehab today at 12:20pm.

## 2013-09-14 ENCOUNTER — Encounter: Payer: Self-pay | Admitting: Cardiovascular Disease

## 2013-10-01 ENCOUNTER — Encounter: Payer: Self-pay | Admitting: Cardiovascular Disease

## 2013-10-13 ENCOUNTER — Other Ambulatory Visit: Payer: Self-pay | Admitting: Family Medicine

## 2013-10-13 MED ORDER — HYDROCODONE-ACETAMINOPHEN 5-325 MG PO TABS: 1.0000 | ORAL_TABLET | Freq: Four times a day (QID) | ORAL | Status: DC | PRN

## 2013-10-13 NOTE — Telephone Encounter (Signed)
I can refill it times one  If her pain is still bad enough to need it please schedule f/u with Dr Lorelei Pont also -thanks Px printed for pick up in IN box

## 2013-10-13 NOTE — Telephone Encounter (Signed)
Lm on pts vm informing her Rx is available for pickup;informed a gov't issued photo id required for pickup 

## 2013-10-13 NOTE — Telephone Encounter (Signed)
Pt left vm on triage requesting Hydrocodone refill.  She says Dr. Lorelei Pont prescribed this for her in the past for shoulder/back pain.  She cannot take tylenol or advil r/t liver and kidney issues.

## 2013-11-01 ENCOUNTER — Encounter: Payer: Self-pay | Admitting: Cardiovascular Disease

## 2013-11-08 ENCOUNTER — Other Ambulatory Visit: Payer: Self-pay | Admitting: *Deleted

## 2013-11-08 MED ORDER — GLIMEPIRIDE 2 MG PO TABS: 2.0000 mg | ORAL_TABLET | Freq: Two times a day (BID) | ORAL | Status: DC

## 2013-11-08 MED ORDER — PANTOPRAZOLE SODIUM 40 MG PO TBEC: 40.0000 mg | DELAYED_RELEASE_TABLET | Freq: Every day | ORAL | Status: DC

## 2013-12-01 ENCOUNTER — Encounter: Payer: Self-pay | Admitting: Cardiovascular Disease

## 2013-12-23 ENCOUNTER — Encounter: Payer: Self-pay | Admitting: Family Medicine

## 2013-12-23 ENCOUNTER — Ambulatory Visit (INDEPENDENT_AMBULATORY_CARE_PROVIDER_SITE_OTHER): Payer: Medicare Other | Admitting: Family Medicine

## 2013-12-23 VITALS — BP 130/60 | HR 76 | Temp 98.1°F | Ht 62.0 in | Wt 154.0 lb

## 2013-12-23 DIAGNOSIS — M719 Bursopathy, unspecified: Secondary | ICD-10-CM

## 2013-12-23 DIAGNOSIS — M6790 Unspecified disorder of synovium and tendon, unspecified site: Secondary | ICD-10-CM

## 2013-12-23 DIAGNOSIS — M67911 Unspecified disorder of synovium and tendon, right shoulder: Secondary | ICD-10-CM

## 2013-12-23 DIAGNOSIS — M19019 Primary osteoarthritis, unspecified shoulder: Secondary | ICD-10-CM

## 2013-12-23 DIAGNOSIS — M259 Joint disorder, unspecified: Secondary | ICD-10-CM

## 2013-12-23 DIAGNOSIS — M19011 Primary osteoarthritis, right shoulder: Secondary | ICD-10-CM

## 2013-12-23 NOTE — Progress Notes (Signed)
Date:  12/23/2013   Name:  Morgan Hubbard   DOB:  [birth year]   MRN:  [id number] Gender: female Age: 79 y.o.  Primary Physician:  [name], MD   Chief Complaint: Follow-up   Subjective:   History of Present Illness:  Morgan Hubbard is a 79 y.o. pleasant patient who presents with the following:  The patient has had some persistent pain and problems after a trauma to her RIGHT shoulder approximately 9 months ago. At that point, she traumatized the anterior aspect of her shoulder, but she has had persistent pain, initially she had some difficulty with range of motion, abduction, flexion, and she has had persistent pain. Right now she is having some pain it is waking her up at night.  I previously did an MRI of her RIGHT shoulder that showed that her cuff was intact, and there was some supraspinatus tendinopathy, subacromial bursitis, extensive arthritic changes in the acromioclavicular joint, and glenohumeral arthritis.  She is here today to talk about other potential options.  05/10/2013 Last OV with Morgan Loffler, MD  The patient is here today in the office in followup regarding her RIGHT sided shoulder injury that she sustained as described below. She rarely still is complaining of pain directly at the a.c. Joint, and last time about that she primarily had an a.c. Joint separation, with new onset prominence of her acromion compared to prior to her fall. Also suspect she had some significant amount of bone contusion, but could not fully exclude a rotator cuff tear.  She is here today and having more pain than expected, she is still in the sling gave her the last time she was in the office.  History is significant for 1 kidney. S/p donation of 1 kidney.  04/28/2013 OV: R shoulder pain: very pleasant patient with multiple medical problems he fell on the point of her shoulder she was at church on Sunday. She also banged her knee and hit her face as well. She has no changes in mentation.  Now she has a bump on the top of her shoulder. She has no significant history of shoulder problems on that side and she has no history of prior operations. No history of significant fractures. Now she is having some significant pain and pain with abduction and pain with flexion. There is no bruising.  [name] going into church on Sunday and right shoulder. Has a knot on the right shoulder. 4 days ago. No prior injuries or shoulder.   [facility] joint  Patient Active Problem List   Diagnosis Date Noted  . Nonischemic cardiomyopathy 04/05/2013  . Shortness of breath 09/03/2012  . ETD (eustachian tube dysfunction) 06/16/2012  . Cerumen impaction 06/16/2012  . Gallstones 05/26/2012  . Elevated transaminase level 05/18/2012  . Spinal stenosis of lumbar region 12/10/2011  . Mixed incontinence urge and stress 12/10/2011  . Renal insufficiency 11/04/2011  . Goiter 01/14/2011  . Tongue swelling 12/01/2010  . Fatty liver 08/28/2010  . DM 07/09/2010  . HYPERCHOLESTEROLEMIA 07/09/2010  . HYPERKALEMIA 07/09/2010  . REFLEX SYMPATHETIC DYSTROPHY 07/09/2010  . POLYNEUROPATHY 07/09/2010  . HYPERTENSION 07/09/2010  . CARDIOMYOPATHY 07/09/2010  . OVERACTIVE BLADDER 07/09/2010    Past Medical History  Diagnosis Date  . Arthritis   . Diabetes mellitus   . Headache(784.0)   . GERD (gastroesophageal reflux disease)   . Ulcer   . Hyperlipidemia   . Migraine   . Colon polyps   . Thyroid disease   . Shingles   .  Urine incontinence   . Goiter past remote    treated with RI  . Other primary cardiomyopathies   . Hyperpotassemia   . Inflammatory and toxic neuropathy, unspecified   . Reflex sympathetic dystrophy, unspecified   . Hypertonicity of bladder   . Hypertension   . Shortness of breath     ambulation  . Migraine     hx of  . Vertigo     hx of  . Overactive bladder     Past Surgical History  Procedure Laterality Date  . Breast surgery  19599    breast cyst  . Finger surgery  [place]    . Tubal ligation  1976  . Kidney donation  1989    left kidney  . Spine surgery  1999  . Ct scan  3/12    outside hosp- lung nodule and gallstones  . Upper gastrointestinal endoscopy    . Colonoscopy  7/12    normal (hx of polyps in past)   . Back surgery    . Eye surgery      cataract removal in left eye; several eye injections  . Lumbar laminectomy/decompression microdiscectomy  01/31/2012    Procedure: LUMBAR LAMINECTOMY/DECOMPRESSION MICRODISCECTOMY 2 LEVELS;  Surgeon: Eustace Moore, MD;  Location: [facility];  Service: Neurosurgery;  Laterality: N/A;  Thoracic twelve-lumbar one, lumbar one-two laminectomy   . Cardiac catheterization      Baldo Ash    History   Social History  . Marital Status: Married    Spouse Name: N/A    Number of Children: 2  . Years of Education: N/A   Occupational History  . retired - Production manager     Social History Main Topics  . Smoking status: Never Smoker   . Smokeless tobacco: Never Used  . Alcohol Use: No  . Drug Use: No  . Sexual Activity: Not on file   Other Topics Concern  . Not on file   Social History Narrative   Married 2nd time   Daughter is [name]     Family History  Problem Relation Age of Onset  . Arthritis Mother   . Cancer Mother     uterine and mouth  . Hyperlipidemia Mother   . Stroke Mother   . Hypertension Mother   . Alcohol abuse Father   . Diabetes Father   . Cancer Sister     breast  . Diabetes Sister   . Cancer Brother     lung cancer  . Kidney disease Brother   . Diabetes Brother   . Hyperlipidemia Sister   . Heart disease Sister   . Hypertension Sister   . Diabetes Sister   . Hyperlipidemia Brother   . Kidney failure Brother   . Diabetes Daughter     Allergies  Allergen Reactions  . Ace Inhibitors Swelling    REACTION: tongue swelling  . Codeine Other (See Comments)    REACTION: nausea  . Lisinopril Swelling    Swelling of tongue  . Nsaids   . Tylenol [Acetaminophen]      Medication list has been reviewed and updated.  Review of Systems:  GEN: No fevers, chills. Nontoxic. Primarily MSK c/o today. MSK: Detailed in the HPI GI: tolerating PO intake without difficulty Neuro: No numbness, parasthesias, or tingling associated. Otherwise the pertinent positives of the ROS are noted above.   Objective:   Physical Examination: BP 130/60  Pulse 76  Temp(Src) 98.1 F (36.7 C) (Oral)  Ht 5\' 2"  (1.575 m)  Wt 154 lb (69.854 kg)  BMI 28.16 kg/m2  LMP 06/03/1992  Ideal Body Weight: Weight in (lb) to have BMI = 25: 136.4   GEN: WDWN, NAD, Non-toxic, Alert & Oriented x 3 HEENT: Atraumatic, Normocephalic.  Ears and Nose: No external deformity. EXTR: No clubbing/cyanosis/edema NEURO: Normal gait.  PSYCH: Normally interactive. Conversant. Not depressed or anxious appearing.  Calm demeanor.    The clavicle is nontender. All of the RIGHT side. At the a.c. Joint there is a more prominent acromion and the a.c. Ligament is tender to palpation at the a.c. Joint. She remains tender directly in this area. Nontender in the bicipital groove. Nontender with compression of the humeral head and the length of the humerus. Nontender at the scapula.  Abduction is full passively and full flexion.  Abduction strength is 5/5 Flexion is 5/5. Internal and external range of motion is 5/5.  All other special testing of the shoulder is equivocal given acute trauma, continued pain.  Dg Shoulder Right  04/28/2013   CLINICAL DATA:  Right shoulder pain after falling 4 days ago.  EXAM: RIGHT SHOULDER - 2+ VIEW  COMPARISON:  Chest radiographs 05/11/2012  FINDINGS: The mineralization and alignment are normal. There is no evidence of acute fracture or dislocation. The subacromial space is preserved. There are acromioclavicular and glenohumeral degenerative changes. There are faint calcifications superolateral to the humeral head, likely hydroxyapatite deposition within the distal rotator  cuff. No discrete loose bodies identified. Degenerative changes are noted throughout the thoracic spine.  IMPRESSION: No acute osseous findings. Degenerative changes as described. Suspected calcific rotator cuff tendinosis.   Electronically Signed   By: Camie Patience M.D.   On: 04/28/2013 10:49   The radiological images were independently reviewed by myself in the office and results were reviewed with the patient. My independent interpretation of images:  There is a mild degree of glenohumeral joint osteoarthritic change. Joint space is relatively well preserved. Acromioclavicular joint shows extensive degenerative changes. There is no occult fracture or dislocation. There does appear to be calcific changes in the supraspinatus.  No results found.  Assessment & Plan:    AC joint arthropathy  Osteoarthritis of glenohumeral joint, right  Tendinopathy of rotator cuff, right   We discussed continued conservative management versus potential operative intervention. The patient has essentially no interest in pursuing surgery unless she was almost completely debilitated. I reviewed with her that arthroscopy may be an option for her.  For now, given her one kidney, and focal arthritic changes at the a.c. Joint as well as glenohumeral arthritis, I'm going to do a diagnostic and therapeutic injection at the a.c. Joint as well as in the glenohumeral joint to see if this relieves her symptoms. Hopefully her initial trauma causing very significant flareup of these joints, and if we could calm him down she will do better and have less pain.  AC Joint Injection, RIGHT Verbal consent was obtained from the patient. Risks, benefits, and alternatives have been reviewed including possible infection. The patient was prepped with Chloraprep. Ethyl Chloride used for anesthesia. Under sterile conditions, 1/2 cc of Lidocaine 1% and Depo-Medrol 20 mg directly injected into at the superior-lateral AC joint. The patient  tolerated the procedure well and had decreased symptoms after injection. No complications.    Intrarticular Shoulder Injection, RIGHT Verbal consent was obtained from the patient. Risks including infection explained and contrasted with benefits and alternatives. Patient prepped with Chloraprep and Ethyl Chloride used for anesthesia.  An intraarticular shoulder injection was performed using the posterior approach. The patient tolerated the procedure well and had decreased pain post injection. No complications. Injection: 8 cc of Lidocaine 1% and Depo-Medrol 80 mg. Needle: 22 gauge   Signed,  Blanca Thornton T. Jazel Nimmons, MD, [place] [facility] [place] [address] Phone: [phone] Fax: [phone]  Updated Complete Medication List:   Medication List       This list is accurate as of: 12/23/13 11:59 PM.  Always use your most recent med list.               aspirin 81 MG tablet  Take 81 mg by mouth daily.     carvedilol 12.5 MG tablet  Commonly known as:  COREG  Take 1 tablet (12.5 mg total) by mouth 2 (two) times daily with a meal.     cholecalciferol 1000 UNITS tablet  Commonly known as:  VITAMIN D  Take 1,000 Units by mouth daily.     gabapentin 300 MG capsule  Commonly known as:  NEURONTIN  Take 1 capsule (300 mg total) by mouth 3 (three) times daily.     glimepiride 2 MG tablet  Commonly known as:  AMARYL  Take 1 tablet (2 mg total) by mouth 2 (two) times daily.     hydrALAZINE 25 MG tablet  Commonly known as:  APRESOLINE  Take 2 tablets (50 mg total) by mouth 3 (three) times daily.     HYDROcodone-acetaminophen 5-325 MG per tablet  Commonly known as:  NORCO/VICODIN  Take 1 tablet by mouth every 6 (six) hours as needed. For pain     isosorbide mononitrate 30 MG 24 hr tablet  Commonly known as:  IMDUR  Take 1 tablet (30 mg total) by mouth daily.     metFORMIN 500 MG 24 hr tablet  Commonly known as:  GLUCOPHAGE-XR    TAKE TWO TABLETS BY MOUTH TWICE DAILY     MILK THISTLE PO  Take 1 capsule by mouth daily.     [name] 50 MG Tb24 tablet  Generic drug:  mirabegron ER  Take by mouth daily.     pantoprazole 40 MG tablet  Commonly known as:  PROTONIX  Take 1 tablet (40 mg total) by mouth daily.     traMADol 50 MG tablet  Commonly known as:  ULTRAM  Take 1 tablet (50 mg total) by mouth daily as needed.     vitamin B-12 1000 MCG tablet  Commonly known as:  CYANOCOBALAMIN  Take 1,000 mcg by mouth daily.

## 2013-12-23 NOTE — Progress Notes (Signed)
Pre visit review using our clinic review tool, if applicable. No additional management support is needed unless otherwise documented below in the visit note. 

## 2014-01-01 DIAGNOSIS — I639 Cerebral infarction, unspecified: Secondary | ICD-10-CM

## 2014-01-01 DIAGNOSIS — G459 Transient cerebral ischemic attack, unspecified: Secondary | ICD-10-CM

## 2014-01-01 DIAGNOSIS — C4491 Basal cell carcinoma of skin, unspecified: Secondary | ICD-10-CM

## 2014-01-01 HISTORY — DX: Transient cerebral ischemic attack, unspecified: G45.9

## 2014-01-01 HISTORY — DX: Cerebral infarction, unspecified: I63.9

## 2014-01-01 HISTORY — DX: Basal cell carcinoma of skin, unspecified: C44.91

## 2014-01-13 ENCOUNTER — Ambulatory Visit: Payer: Medicare Other | Admitting: Nurse Practitioner

## 2014-01-13 ENCOUNTER — Emergency Department (HOSPITAL_COMMUNITY): Payer: Medicare Other

## 2014-01-13 ENCOUNTER — Inpatient Hospital Stay (HOSPITAL_COMMUNITY)
Admission: EM | Admit: 2014-01-13 | Discharge: 2014-01-15 | DRG: 065 | Disposition: A | Discharge status: Home / Self Care | Payer: Medicare Other | Attending: Internal Medicine | Admitting: Internal Medicine

## 2014-01-13 ENCOUNTER — Telehealth: Payer: Self-pay

## 2014-01-13 ENCOUNTER — Encounter (HOSPITAL_COMMUNITY): Payer: Self-pay | Admitting: Emergency Medicine

## 2014-01-13 ENCOUNTER — Inpatient Hospital Stay (HOSPITAL_COMMUNITY): Payer: Medicare Other

## 2014-01-13 DIAGNOSIS — E119 Type 2 diabetes mellitus without complications: Secondary | ICD-10-CM

## 2014-01-13 DIAGNOSIS — E1129 Type 2 diabetes mellitus with other diabetic kidney complication: Secondary | ICD-10-CM | POA: Diagnosis present

## 2014-01-13 DIAGNOSIS — Z79899 Other long term (current) drug therapy: Secondary | ICD-10-CM | POA: Diagnosis not present

## 2014-01-13 DIAGNOSIS — R4789 Other speech disturbances: Secondary | ICD-10-CM | POA: Diagnosis present

## 2014-01-13 DIAGNOSIS — R7401 Elevation of levels of liver transaminase levels: Secondary | ICD-10-CM

## 2014-01-13 DIAGNOSIS — K8020 Calculus of gallbladder without cholecystitis without obstruction: Secondary | ICD-10-CM

## 2014-01-13 DIAGNOSIS — Z801 Family history of malignant neoplasm of trachea, bronchus and lung: Secondary | ICD-10-CM | POA: Diagnosis not present

## 2014-01-13 DIAGNOSIS — Z9849 Cataract extraction status, unspecified eye: Secondary | ICD-10-CM

## 2014-01-13 DIAGNOSIS — Z888 Allergy status to other drugs, medicaments and biological substances status: Secondary | ICD-10-CM

## 2014-01-13 DIAGNOSIS — I639 Cerebral infarction, unspecified: Secondary | ICD-10-CM

## 2014-01-13 DIAGNOSIS — R4701 Aphasia: Secondary | ICD-10-CM | POA: Diagnosis present

## 2014-01-13 DIAGNOSIS — Z7982 Long term (current) use of aspirin: Secondary | ICD-10-CM | POA: Diagnosis not present

## 2014-01-13 DIAGNOSIS — R740 Nonspecific elevation of levels of transaminase and lactic acid dehydrogenase [LDH]: Secondary | ICD-10-CM

## 2014-01-13 DIAGNOSIS — I10 Essential (primary) hypertension: Secondary | ICD-10-CM | POA: Diagnosis present

## 2014-01-13 DIAGNOSIS — N390 Urinary tract infection, site not specified: Secondary | ICD-10-CM

## 2014-01-13 DIAGNOSIS — I5022 Chronic systolic (congestive) heart failure: Secondary | ICD-10-CM | POA: Diagnosis present

## 2014-01-13 DIAGNOSIS — Z860100 Personal history of colon polyps, unspecified: Secondary | ICD-10-CM

## 2014-01-13 DIAGNOSIS — G619 Inflammatory polyneuropathy, unspecified: Secondary | ICD-10-CM

## 2014-01-13 DIAGNOSIS — E1169 Type 2 diabetes mellitus with other specified complication: Secondary | ICD-10-CM | POA: Diagnosis present

## 2014-01-13 DIAGNOSIS — E875 Hyperkalemia: Secondary | ICD-10-CM

## 2014-01-13 DIAGNOSIS — M48061 Spinal stenosis, lumbar region without neurogenic claudication: Secondary | ICD-10-CM

## 2014-01-13 DIAGNOSIS — I509 Heart failure, unspecified: Secondary | ICD-10-CM | POA: Diagnosis present

## 2014-01-13 DIAGNOSIS — E1149 Type 2 diabetes mellitus with other diabetic neurological complication: Secondary | ICD-10-CM | POA: Diagnosis present

## 2014-01-13 DIAGNOSIS — K219 Gastro-esophageal reflux disease without esophagitis: Secondary | ICD-10-CM | POA: Diagnosis present

## 2014-01-13 DIAGNOSIS — I6330 Cerebral infarction due to thrombosis of unspecified cerebral artery: Principal | ICD-10-CM | POA: Insufficient documentation

## 2014-01-13 DIAGNOSIS — Z8261 Family history of arthritis: Secondary | ICD-10-CM | POA: Diagnosis not present

## 2014-01-13 DIAGNOSIS — Z9851 Tubal ligation status: Secondary | ICD-10-CM

## 2014-01-13 DIAGNOSIS — E7800 Pure hypercholesterolemia, unspecified: Secondary | ICD-10-CM

## 2014-01-13 DIAGNOSIS — R0602 Shortness of breath: Secondary | ICD-10-CM

## 2014-01-13 DIAGNOSIS — Z823 Family history of stroke: Secondary | ICD-10-CM | POA: Diagnosis not present

## 2014-01-13 DIAGNOSIS — N318 Other neuromuscular dysfunction of bladder: Secondary | ICD-10-CM | POA: Diagnosis present

## 2014-01-13 DIAGNOSIS — G9050 Complex regional pain syndrome I, unspecified: Secondary | ICD-10-CM | POA: Diagnosis present

## 2014-01-13 DIAGNOSIS — E785 Hyperlipidemia, unspecified: Secondary | ICD-10-CM | POA: Diagnosis present

## 2014-01-13 DIAGNOSIS — E1142 Type 2 diabetes mellitus with diabetic polyneuropathy: Secondary | ICD-10-CM | POA: Diagnosis present

## 2014-01-13 DIAGNOSIS — Z841 Family history of disorders of kidney and ureter: Secondary | ICD-10-CM | POA: Diagnosis not present

## 2014-01-13 DIAGNOSIS — N3946 Mixed incontinence: Secondary | ICD-10-CM

## 2014-01-13 DIAGNOSIS — N289 Disorder of kidney and ureter, unspecified: Secondary | ICD-10-CM

## 2014-01-13 DIAGNOSIS — Z833 Family history of diabetes mellitus: Secondary | ICD-10-CM | POA: Diagnosis not present

## 2014-01-13 DIAGNOSIS — E049 Nontoxic goiter, unspecified: Secondary | ICD-10-CM

## 2014-01-13 DIAGNOSIS — R220 Localized swelling, mass and lump, head: Secondary | ICD-10-CM

## 2014-01-13 DIAGNOSIS — I428 Other cardiomyopathies: Secondary | ICD-10-CM | POA: Diagnosis present

## 2014-01-13 DIAGNOSIS — Z886 Allergy status to analgesic agent status: Secondary | ICD-10-CM | POA: Diagnosis not present

## 2014-01-13 DIAGNOSIS — G622 Polyneuropathy due to other toxic agents: Secondary | ICD-10-CM

## 2014-01-13 DIAGNOSIS — K760 Fatty (change of) liver, not elsewhere classified: Secondary | ICD-10-CM

## 2014-01-13 HISTORY — DX: Low back pain: M54.5

## 2014-01-13 HISTORY — DX: Abnormal result of cardiovascular function study, unspecified: R94.30

## 2014-01-13 HISTORY — DX: Low back pain, unspecified: M54.50

## 2014-01-13 HISTORY — DX: Type 2 diabetes mellitus without complications: E11.9

## 2014-01-13 HISTORY — DX: Basal cell carcinoma of skin, unspecified: C44.91

## 2014-01-13 HISTORY — DX: Reserved for concepts with insufficient information to code with codable children: IMO0002

## 2014-01-13 HISTORY — DX: Left bundle-branch block, unspecified: I44.7

## 2014-01-13 HISTORY — DX: Aphasia: R47.01

## 2014-01-13 HISTORY — DX: Other chronic pain: G89.29

## 2014-01-13 LAB — COMPREHENSIVE METABOLIC PANEL
ALK PHOS: 87 U/L (ref 39–117)
ALT: 13 U/L (ref 0–35)
ANION GAP: 11 (ref 5–15)
AST: 15 U/L (ref 0–37)
Albumin: 3.2 g/dL — ABNORMAL LOW (ref 3.5–5.2)
BUN: 18 mg/dL (ref 6–23)
CALCIUM: 9.1 mg/dL (ref 8.4–10.5)
CO2: 29 mEq/L (ref 19–32)
Chloride: 102 mEq/L (ref 96–112)
Creatinine, Ser: 0.86 mg/dL (ref 0.50–1.10)
GFR calc Af Amer: 74 mL/min — ABNORMAL LOW (ref >90)
GFR calc non Af Amer: 64 mL/min — ABNORMAL LOW (ref >90)
GLUCOSE: 146 mg/dL — AB (ref 70–99)
Potassium: 5 mEq/L (ref 3.7–5.3)
Sodium: 142 mEq/L (ref 137–147)
TOTAL PROTEIN: 6.7 g/dL (ref 6.0–8.3)
Total Bilirubin: 0.4 mg/dL (ref 0.3–1.2)

## 2014-01-13 LAB — RAPID URINE DRUG SCREEN, HOSP PERFORMED
Amphetamines: NOT DETECTED
Barbiturates: NOT DETECTED
Benzodiazepines: NOT DETECTED
Cocaine: NOT DETECTED
OPIATES: NOT DETECTED
TETRAHYDROCANNABINOL: NOT DETECTED

## 2014-01-13 LAB — URINALYSIS, ROUTINE W REFLEX MICROSCOPIC
Bilirubin Urine: NEGATIVE
Glucose, UA: NEGATIVE mg/dL
HGB URINE DIPSTICK: NEGATIVE
Ketones, ur: NEGATIVE mg/dL
NITRITE: POSITIVE — AB
Protein, ur: 100 mg/dL — AB
Specific Gravity, Urine: 1.009 (ref 1.005–1.030)
Urobilinogen, UA: 0.2 mg/dL (ref 0.0–1.0)
pH: 6.5 (ref 5.0–8.0)

## 2014-01-13 LAB — PROTIME-INR
INR: 0.98 (ref 0.00–1.49)
Prothrombin Time: 13 seconds (ref 11.6–15.2)

## 2014-01-13 LAB — CBC
HCT: 43.9 % (ref 36.0–46.0)
HEMOGLOBIN: 14.5 g/dL (ref 12.0–15.0)
MCH: 28.9 pg (ref 26.0–34.0)
MCHC: 33 g/dL (ref 30.0–36.0)
MCV: 87.5 fL (ref 78.0–100.0)
Platelets: 207 10*3/uL (ref 150–400)
RBC: 5.02 MIL/uL (ref 3.87–5.11)
RDW: 12.7 % (ref 11.5–15.5)
WBC: 6.9 10*3/uL (ref 4.0–10.5)

## 2014-01-13 LAB — DIFFERENTIAL
Basophils Absolute: 0 10*3/uL (ref 0.0–0.1)
Basophils Relative: 0 % (ref 0–1)
EOS ABS: 0.4 10*3/uL (ref 0.0–0.7)
EOS PCT: 6 % — AB (ref 0–5)
LYMPHS ABS: 2.1 10*3/uL (ref 0.7–4.0)
Lymphocytes Relative: 30 % (ref 12–46)
MONO ABS: 0.5 10*3/uL (ref 0.1–1.0)
MONOS PCT: 7 % (ref 3–12)
Neutro Abs: 4 10*3/uL (ref 1.7–7.7)
Neutrophils Relative %: 57 % (ref 43–77)

## 2014-01-13 LAB — I-STAT TROPONIN, ED: Troponin i, poc: 0 ng/mL (ref 0.00–0.08)

## 2014-01-13 LAB — URINE MICROSCOPIC-ADD ON

## 2014-01-13 LAB — APTT: aPTT: 29 seconds (ref 24–37)

## 2014-01-13 LAB — ETHANOL: Alcohol, Ethyl (B): <11 mg/dL (ref 0–11)

## 2014-01-13 MED ORDER — VITAMIN B-12 1000 MCG PO TABS
1000.0000 ug | ORAL_TABLET | Freq: Every day | ORAL | Status: DC
Start: 2014-01-13 — End: 2014-01-15
  Administered 2014-01-13 – 2014-01-15 (×3): 1000 ug via ORAL
  Filled 2014-01-13 (×3): qty 1

## 2014-01-13 MED ORDER — PANTOPRAZOLE SODIUM 40 MG PO TBEC
40.0000 mg | DELAYED_RELEASE_TABLET | Freq: Every day | ORAL | Status: DC
  Administered 2014-01-13 – 2014-01-15 (×3): 40 mg via ORAL
  Filled 2014-01-13 (×3): qty 1

## 2014-01-13 MED ORDER — ISOSORBIDE MONONITRATE ER 30 MG PO TB24
30.0000 mg | ORAL_TABLET | Freq: Every day | ORAL | Status: DC
  Administered 2014-01-13 – 2014-01-14 (×2): 30 mg via ORAL
  Filled 2014-01-13 (×3): qty 1

## 2014-01-13 MED ORDER — TRAMADOL HCL 50 MG PO TABS
50.0000 mg | ORAL_TABLET | Freq: Four times a day (QID) | ORAL | Status: DC | PRN
  Administered 2014-01-14: 50 mg via ORAL
  Filled 2014-01-13: qty 1

## 2014-01-13 MED ORDER — ONDANSETRON HCL 4 MG PO TABS: 4.0000 mg | ORAL_TABLET | Freq: Four times a day (QID) | ORAL | Status: DC | PRN

## 2014-01-13 MED ORDER — HYDRALAZINE HCL 50 MG PO TABS
50.0000 mg | ORAL_TABLET | Freq: Three times a day (TID) | ORAL | Status: DC
  Administered 2014-01-13 – 2014-01-15 (×4): 50 mg via ORAL
  Filled 2014-01-13 (×6): qty 1

## 2014-01-13 MED ORDER — HYDROCODONE-ACETAMINOPHEN 5-325 MG PO TABS
1.0000 | ORAL_TABLET | Freq: Four times a day (QID) | ORAL | Status: DC | PRN
  Administered 2014-01-14: 1 via ORAL
  Filled 2014-01-13: qty 1

## 2014-01-13 MED ORDER — ASPIRIN 325 MG PO TABS
325.0000 mg | ORAL_TABLET | Freq: Every day | ORAL | Status: DC
  Administered 2014-01-13 – 2014-01-14 (×2): 325 mg via ORAL
  Filled 2014-01-13 (×3): qty 1

## 2014-01-13 MED ORDER — CARVEDILOL 12.5 MG PO TABS
12.5000 mg | ORAL_TABLET | Freq: Two times a day (BID) | ORAL | Status: DC
  Administered 2014-01-14 – 2014-01-15 (×3): 12.5 mg via ORAL
  Filled 2014-01-13 (×3): qty 1

## 2014-01-13 MED ORDER — GABAPENTIN 300 MG PO CAPS
300.0000 mg | ORAL_CAPSULE | Freq: Three times a day (TID) | ORAL | Status: DC
  Administered 2014-01-13 – 2014-01-15 (×6): 300 mg via ORAL
  Filled 2014-01-13 (×3): qty 1
  Filled 2014-01-13: qty 3
  Filled 2014-01-13 (×2): qty 1

## 2014-01-13 MED ORDER — ONDANSETRON HCL 4 MG/2ML IJ SOLN: 4.0000 mg | Freq: Four times a day (QID) | INTRAMUSCULAR | Status: DC | PRN

## 2014-01-13 MED ORDER — ENOXAPARIN SODIUM 40 MG/0.4ML ~~LOC~~ SOLN
40.0000 mg | SUBCUTANEOUS | Status: DC
  Administered 2014-01-13 – 2014-01-14 (×2): 40 mg via SUBCUTANEOUS
  Filled 2014-01-13 (×4): qty 0.4

## 2014-01-13 MED ORDER — MIRABEGRON ER 50 MG PO TB24
50.0000 mg | ORAL_TABLET | Freq: Every day | ORAL | Status: DC
  Administered 2014-01-13 – 2014-01-15 (×3): 50 mg via ORAL
  Filled 2014-01-13 (×3): qty 1

## 2014-01-13 MED ORDER — LORAZEPAM 0.5 MG PO TABS
0.5000 mg | ORAL_TABLET | ORAL | Status: AC
  Administered 2014-01-14: 0.5 mg via ORAL
  Filled 2014-01-13: qty 1

## 2014-01-13 NOTE — Consult Note (Signed)
NEURO HOSPITALIST CONSULT NOTE    Reason for Consult: possible  seizure  HPI:                                                                                                                                          Morgan Hubbard is an 79 y.o. female who has noted over the past week to have three separate incidences which she will try to speak but finds herself unable to get her words out. She states this only lasts for a few seconds and then after she feels as though her head feels funny. Currently she is back to her baseline.  Her last episode was yesterday.  She has come to the ED today due to fear this may be TIA or something of concern. She states she takes a asprin daily.   Past Medical History  Diagnosis Date  . Arthritis   . Diabetes mellitus   . Headache(784.0)   . GERD (gastroesophageal reflux disease)   . Ulcer   . Hyperlipidemia   . Migraine   . Colon polyps   . Thyroid disease   . Shingles   . Urine incontinence   . Goiter past remote    treated with RI  . Other primary cardiomyopathies   . Hyperpotassemia   . Inflammatory and toxic neuropathy, unspecified   . Reflex sympathetic dystrophy, unspecified   . Hypertonicity of bladder   . Hypertension   . Shortness of breath     ambulation  . Migraine     hx of  . Vertigo     hx of  . Overactive bladder     Past Surgical History  Procedure Laterality Date  . Breast surgery  19599    breast cyst  . Finger surgery  [place]  . Tubal ligation  1976  . Kidney donation  1989    left kidney  . Spine surgery  1999  . Ct scan  3/12    outside hosp- lung nodule and gallstones  . Upper gastrointestinal endoscopy    . Colonoscopy  7/12    normal (hx of polyps in past)   . Back surgery    . Eye surgery      cataract removal in left eye; several eye injections  . Lumbar laminectomy/decompression microdiscectomy  01/31/2012    Procedure: LUMBAR LAMINECTOMY/DECOMPRESSION MICRODISCECTOMY 2  LEVELS;  Surgeon: Eustace Moore, MD;  Location: [facility];  Service: Neurosurgery;  Laterality: N/A;  Thoracic twelve-lumbar one, lumbar one-two laminectomy   . Cardiac catheterization      [place]    Family History  Problem Relation Age of Onset  . Arthritis Mother   . Cancer Mother     uterine and mouth  . Hyperlipidemia Mother   .  Stroke Mother   . Hypertension Mother   . Alcohol abuse Father   . Diabetes Father   . Cancer Sister     breast  . Diabetes Sister   . Cancer Brother     lung cancer  . Kidney disease Brother   . Diabetes Brother   . Hyperlipidemia Sister   . Heart disease Sister   . Hypertension Sister   . Diabetes Sister   . Hyperlipidemia Brother   . Kidney failure Brother   . Diabetes Daughter     Social History:  reports that she has never smoked. She has never used smokeless tobacco. She reports that she does not drink alcohol or use illicit drugs.  Allergies  Allergen Reactions  . Ace Inhibitors Swelling    REACTION: tongue swelling  . Codeine Other (See Comments)    REACTION: nausea  . Lisinopril Swelling    Swelling of tongue  . Nsaids   . Tylenol [Acetaminophen]     MEDICATIONS:                                                                                                                     No current facility-administered medications for this encounter.   Current Outpatient Prescriptions  Medication Sig Dispense Refill  . aspirin 81 MG tablet Take 81 mg by mouth daily.        . carvedilol (COREG) 12.5 MG tablet Take 1 tablet (12.5 mg total) by mouth 2 (two) times daily with a meal.  180 tablet  4  . cholecalciferol (VITAMIN D) 1000 UNITS tablet Take 1,000 Units by mouth daily.      Marland Kitchen gabapentin (NEURONTIN) 300 MG capsule Take 1 capsule (300 mg total) by mouth 3 (three) times daily.  270 capsule  1  . glimepiride (AMARYL) 2 MG tablet Take 1 tablet (2 mg total) by mouth 2 (two) times daily.  180 tablet  0  . hydrALAZINE (APRESOLINE)  25 MG tablet Take 2 tablets (50 mg total) by mouth 3 (three) times daily.  180 tablet  6  . HYDROcodone-acetaminophen (NORCO/VICODIN) 5-325 MG per tablet Take 1 tablet by mouth every 6 (six) hours as needed. For pain  30 tablet  0  . isosorbide mononitrate (IMDUR) 30 MG 24 hr tablet Take 1 tablet (30 mg total) by mouth daily.  90 tablet  4  . metFORMIN (GLUCOPHAGE-XR) 500 MG 24 hr tablet Take 1,000 mg by mouth 2 (two) times daily.      Marland Kitchen MILK THISTLE PO Take 1 capsule by mouth daily.      . mirabegron ER ([name]) 50 MG TB24 Take by mouth daily.      . pantoprazole (PROTONIX) 40 MG tablet Take 1 tablet (40 mg total) by mouth daily.  90 tablet  0  . traMADol (ULTRAM) 50 MG tablet Take 1 tablet (50 mg total) by mouth daily as needed.  40 tablet  2  . vitamin B-12 (CYANOCOBALAMIN) 1000 MCG tablet Take 1,000 mcg by mouth  daily.          ROS:                                                                                                                                       History obtained from the patient  General ROS: negative for - chills, fatigue, fever, night sweats, weight gain or weight loss Psychological ROS: negative for - behavioral disorder, hallucinations, memory difficulties, mood swings or suicidal ideation Ophthalmic ROS: negative for - blurry vision, double vision, eye pain or loss of vision ENT ROS: negative for - epistaxis, nasal discharge, oral lesions, sore throat, tinnitus or vertigo Allergy and Immunology ROS: negative for - hives or itchy/watery eyes Hematological and Lymphatic ROS: negative for - bleeding problems, bruising or swollen lymph nodes Endocrine ROS: negative for - galactorrhea, hair pattern changes, polydipsia/polyuria or temperature intolerance Respiratory ROS: negative for - cough, hemoptysis, shortness of breath or wheezing Cardiovascular ROS: negative for - chest pain, dyspnea on exertion, edema or irregular heartbeat Gastrointestinal ROS: negative for -  abdominal pain, diarrhea, hematemesis, nausea/vomiting or stool incontinence Genito-Urinary ROS: negative for - dysuria, hematuria, incontinence or urinary frequency/urgency Musculoskeletal ROS: negative for - joint swelling or muscular weakness Neurological ROS: as noted in HPI Dermatological ROS: negative for rash and skin lesion changes   Blood pressure 152/53, pulse 78, temperature 97.9 F (36.6 C), temperature source Oral, resp. rate 17, height 5\' 2"  (1.575 m), weight 69.854 kg (154 lb), last menstrual period 06/03/1992, SpO2 96.00%.   Neurologic Examination:                                                                                                      General: NAD Mental Status: Alert, oriented, thought content appropriate.  Speech fluent without evidence of aphasia.  Able to follow 3 step commands without difficulty. Cranial Nerves: II: Discs flat bilaterally; Visual fields grossly normal, pupils equal, round, reactive to light and accommodation III,IV, VI: ptosis not present, extra-ocular motions intact bilaterally V,VII: smile symmetric, facial light touch sensation normal bilaterally VIII: hearing normal bilaterally IX,X: gag reflex present XI: bilateral shoulder shrug XII: midline tongue extension without atrophy or fasciculations  Motor: Right : Upper extremity   5/5    Left:     Upper extremity   5/5  Lower extremity   5/5     Lower extremity   5/5 Tone and bulk:normal tone throughout; no atrophy noted Sensory: Pinprick and light touch intact throughout, bilaterally Deep Tendon  Reflexes:  Right: Upper Extremity   Left: Upper extremity   biceps (C-5 to C-6) 2/4   biceps (C-5 to C-6) 2/4 tricep (C7) 2/4    triceps (C7) 2/4 Brachioradialis (C6) 2/4  Brachioradialis (C6) 2/4  Lower Extremity Lower Extremity  quadriceps (L-2 to L-4) 2/4   quadriceps (L-2 to L-4) 2/4 Achilles (S1) 2/4   Achilles (S1) 2/4  Plantars: Right: downgoing   Left:  downgoing Cerebellar: normal finger-to-nose,  normal heel-to-shin test Gait: not tested CV: pulses palpable throughout    Lab Results: Basic Metabolic Panel:  Recent Labs Lab 01/13/14 1052  NA 142  K 5.0  CL 102  CO2 29  GLUCOSE 146*  BUN 18  CREATININE 0.86  CALCIUM 9.1    Liver Function Tests:  Recent Labs Lab 01/13/14 1052  AST 15  ALT 13  ALKPHOS 87  BILITOT 0.4  PROT 6.7  ALBUMIN 3.2*   No results found for this basename: LIPASE, AMYLASE,  in the last 168 hours No results found for this basename: AMMONIA,  in the last 168 hours  CBC:  Recent Labs Lab 01/13/14 1052  WBC 6.9  NEUTROABS 4.0  HGB 14.5  HCT 43.9  MCV 87.5  PLT 207    Cardiac Enzymes: No results found for this basename: CKTOTAL, CKMB, CKMBINDEX, TROPONINI,  in the last 168 hours  Lipid Panel: No results found for this basename: CHOL, TRIG, HDL, CHOLHDL, VLDL, LDLCALC,  in the last 168 hours  CBG: No results found for this basename: GLUCAP,  in the last 168 hours  Microbiology: Results for orders placed during the hospital encounter of 01/27/12  SURGICAL PCR SCREEN     Status: Abnormal   Collection Time    01/27/12  3:50 PM      Result Value Ref Range Status   MRSA, PCR NEGATIVE  NEGATIVE Final   Staphylococcus aureus POSITIVE (*) NEGATIVE Final   Comment:            The Xpert SA Assay (FDA     approved for NASAL specimens     in patients over 45 years of age),     is one component of     a comprehensive surveillance     program.  Test performance has     been validated by [employer] for patients greater     than or equal to 77 year old.     It is not intended     to diagnose infection nor to     guide or monitor treatment.    Coagulation Studies:  Recent Labs  01/13/14 1052  LABPROT 13.0  INR 0.98    Imaging: Ct Head Wo Contrast  01/13/2014   CLINICAL DATA:  Facial droop about the left eye.  EXAM: CT HEAD WITHOUT CONTRAST  TECHNIQUE: Contiguous axial  images were obtained from the base of the skull through the vertex without intravenous contrast.  COMPARISON:  None.  FINDINGS: There is no evidence of acute intracranial abnormality including infarct, hemorrhage, mass lesion, mass effect, midline shift or abnormal extra-axial fluid collection. No hydrocephalus or pneumocephalus. Imaged paranasal sinuses and mastoid air cells are clear. Carotid atherosclerosis noted.  IMPRESSION: No acute finding.   Electronically Signed   By: Inge Rise M.D.   On: 01/13/2014 11:20    Assessment and plan per attending neurologist  Etta Quill PA-C [facility] [phone]  01/13/2014, 2:42 PM   Assessment/Plan: 79 YO female with three episodes  of speech arrest followed by sensation of abnormal sensorium. Initial CT head negative for CVA.  Given each episode was stereotypical of each other, further evaluation for possible ictal dysphasia in the context of partial complex seizures would be helpful. TIA less likely due to very short lived and stereotypic episodes.   Recommend: 1) EEG 2) MRI brain without contrast   Patient seen and examined together with physician assistant and I concur with the assessment and plan.  Dorian Pod, MD

## 2014-01-13 NOTE — Telephone Encounter (Signed)
Spoke w/ pt.  She reports that she has had sx of what she describes as TIAs for the past several days.  Reports 3 episodes in the past week, the last episode this am.  Advised pt to go to ED for eval, but she states that she would prefer to go to urgent care.  Advised pt to seek attention immediately, and if someone can take her to urgent care, they will send her to ED if they cannot treat her sx or do proper testing.  She is agreeable and call w/ further questions or concerns.

## 2014-01-13 NOTE — ED Notes (Signed)
Neuro PA at bedside.  

## 2014-01-13 NOTE — Progress Notes (Signed)
EEG completed; results pending.    

## 2014-01-13 NOTE — ED Notes (Addendum)
Contacted by MRI staff & informed that the pt was anxious & needed meds, MRI informed that the pt was to go to EEG & then to floor, EEG staff aware prior to transport to EEG, report given to Manus Gunning, RN, Manus Gunning, RN notified & reports speaking to MRI staff, aware of need for meds for anxiety for MRI, Morgan Hubbard., Charge RN notified

## 2014-01-13 NOTE — ED Notes (Signed)
Pt reports since last Monday having spells of aphasia and AMS; last one was yesterday--lasting about 3 minutes; pt denies symptoms currently; pt has small droop to L eye but pt states that is d/t injury she had to eye and is normal; pts speech clear, grips equal, no drift, a&ox4

## 2014-01-13 NOTE — ED Notes (Signed)
Attempted report 

## 2014-01-13 NOTE — H&P (Signed)
[facility] History and Physical  Morgan Hubbard [id number] DOB: 08-18-1934 DOA: 01/13/2014  Referring physician: EDP PCP: [name], MD   Chief Complaint: speech problem  HPI: Morgan Hubbard is a 79 y.o. female with PMH of DM, HTN, Chronic systolic CHF EF [id number] presents to the ER with the above complaint. She reports 3 episodes in the last 10days, last one being yesterday, during which she had acute onset of difficulty with expressing words and word finding difficulty. These episodes lasted for about couple of minutes and this is followed by a period during which her speech was forced and different and then returned to normal. She talked to one of her RN friends about this today who was concerned abt TIA and sent her to the ER for evaluation. Asymptomatic at this time, seen by Neurology and recommended further workup with EEG/MRI    Review of Systems: 12 system review negative except per HPI Constitutional:  No weight loss, night sweats, Fevers, chills, fatigue.  HEENT:  No headaches, Difficulty swallowing,Tooth/dental problems,Sore throat,  No sneezing, itching, ear ache, nasal congestion, post nasal drip,  Cardio-vascular:  No chest pain, Orthopnea, PND, swelling in lower extremities, anasarca, dizziness, palpitations  GI:  No heartburn, indigestion, abdominal pain, nausea, vomiting, diarrhea, change in bowel habits, loss of appetite  Resp:  No shortness of breath with exertion or at rest. No excess mucus, no productive cough, No non-productive cough, No coughing up of blood.No change in color of mucus.No wheezing.No chest wall deformity  Skin:  no rash or lesions.  GU:  no dysuria, change in color of urine, no urgency or frequency. No flank pain.  Musculoskeletal:  No joint pain or swelling. No decreased range of motion. No back pain.  Psych:  No change in mood or affect. No depression or anxiety. No memory loss.   Past Medical History  Diagnosis Date  . Arthritis    . Diabetes mellitus   . Headache(784.0)   . GERD (gastroesophageal reflux disease)   . Ulcer   . Hyperlipidemia   . Migraine   . Colon polyps   . Thyroid disease   . Shingles   . Urine incontinence   . Goiter past remote    treated with RI  . Other primary cardiomyopathies   . Hyperpotassemia   . Inflammatory and toxic neuropathy, unspecified   . Reflex sympathetic dystrophy, unspecified   . Hypertonicity of bladder   . Hypertension   . Shortness of breath     ambulation  . Migraine     hx of  . Vertigo     hx of  . Overactive bladder    Past Surgical History  Procedure Laterality Date  . Breast surgery  19599    breast cyst  . Finger surgery  [place]  . Tubal ligation  1976  . Kidney donation  1989    left kidney  . Spine surgery  1999  . Ct scan  3/12    outside hosp- lung nodule and gallstones  . Upper gastrointestinal endoscopy    . Colonoscopy  7/12    normal (hx of polyps in past)   . Back surgery    . Eye surgery      cataract removal in left eye; several eye injections  . Lumbar laminectomy/decompression microdiscectomy  01/31/2012    Procedure: LUMBAR LAMINECTOMY/DECOMPRESSION MICRODISCECTOMY 2 LEVELS;  Surgeon: Eustace Moore, MD;  Location: [facility];  Service: Neurosurgery;  Laterality: N/A;  Thoracic  twelve-lumbar one, lumbar one-two laminectomy   . Cardiac catheterization      [place]   Social History:  reports that she has never smoked. She has never used smokeless tobacco. She reports that she does not drink alcohol or use illicit drugs.  Allergies  Allergen Reactions  . Ace Inhibitors Swelling    REACTION: tongue swelling  . Codeine Other (See Comments)    REACTION: nausea  . Lisinopril Swelling    Swelling of tongue  . Nsaids   . Tylenol [Acetaminophen]     Family History  Problem Relation Age of Onset  . Arthritis Mother   . Cancer Mother     uterine and mouth  . Hyperlipidemia Mother   . Stroke Mother   . Hypertension  Mother   . Alcohol abuse Father   . Diabetes Father   . Cancer Sister     breast  . Diabetes Sister   . Cancer Brother     lung cancer  . Kidney disease Brother   . Diabetes Brother   . Hyperlipidemia Sister   . Heart disease Sister   . Hypertension Sister   . Diabetes Sister   . Hyperlipidemia Brother   . Kidney failure Brother   . Diabetes Daughter      Prior to Admission medications   Medication Sig Start Date End Date Taking? Authorizing Provider  aspirin 81 MG tablet Take 81 mg by mouth daily.     Yes Historical Provider, MD  carvedilol (COREG) 12.5 MG tablet Take 1 tablet (12.5 mg total) by mouth 2 (two) times daily with a meal. 08/03/13  Yes Minna Merritts, MD  cholecalciferol (VITAMIN D) 1000 UNITS tablet Take 1,000 Units by mouth daily.   Yes Historical Provider, MD  gabapentin (NEURONTIN) 300 MG capsule Take 1 capsule (300 mg total) by mouth 3 (three) times daily. 12/16/12  Yes [place], MD  glimepiride (AMARYL) 2 MG tablet Take 1 tablet (2 mg total) by mouth 2 (two) times daily. 11/08/13  Yes Abner Greenspan, MD  hydrALAZINE (APRESOLINE) 25 MG tablet Take 2 tablets (50 mg total) by mouth 3 (three) times daily. 08/03/13  Yes Minna Merritts, MD  HYDROcodone-acetaminophen (NORCO/VICODIN) 5-325 MG per tablet Take 1 tablet by mouth every 6 (six) hours as needed. For pain 10/13/13  Yes Abner Greenspan, MD  isosorbide mononitrate (IMDUR) 30 MG 24 hr tablet Take 1 tablet (30 mg total) by mouth daily. 08/03/13  Yes Minna Merritts, MD  metFORMIN (GLUCOPHAGE-XR) 500 MG 24 hr tablet Take 1,000 mg by mouth 2 (two) times daily.   Yes Historical Provider, MD  MILK THISTLE PO Take 1 capsule by mouth daily.   Yes Historical Provider, MD  mirabegron ER ([name]) 50 MG TB24 Take by mouth daily.   Yes Historical Provider, MD  pantoprazole (PROTONIX) 40 MG tablet Take 1 tablet (40 mg total) by mouth daily. 11/08/13  Yes Abner Greenspan, MD  traMADol (ULTRAM) 50 MG tablet Take 1 tablet (50 mg total)  by mouth daily as needed. 04/28/13  Yes Spencer Copland, MD  vitamin B-12 (CYANOCOBALAMIN) 1000 MCG tablet Take 1,000 mcg by mouth daily.   Yes Historical Provider, MD   Physical Exam: Filed Vitals:   01/13/14 1230 01/13/14 1358 01/13/14 1400 01/13/14 1438  BP: 147/59 148/59 152/53 151/76  Pulse: 74 69 78 79  Temp:      TempSrc:      Resp:  20 17 24   Height:  Weight:      SpO2: 95% 93% 96% 97%    Wt Readings from Last 3 Encounters:  01/13/14 69.854 kg (154 lb)  12/23/13 69.854 kg (154 lb)  08/03/13 72.235 kg (159 lb 4 oz)    General:  Appears calm and comfortable, no distress Eyes: PERRL, normal lids, irises & conjunctiva ENT: grossly normal lips & tongue Neck: no LAD, masses or thyromegaly Cardiovascular: RRR, no m/r/g. No LE edema. Respiratory: CTA bilaterally, no w/r/r. Normal respiratory effort. Abdomen: soft, NT, BS present Skin: no rash or induration seen on limited exam Musculoskeletal: grossly normal tone BUE/BLE Psychiatric: grossly normal mood and affect, speech fluent and appropriate Neurologic: grossly non-focal.          Labs on Admission:  Basic Metabolic Panel:  Recent Labs Lab 01/13/14 1052  NA 142  K 5.0  CL 102  CO2 29  GLUCOSE 146*  BUN 18  CREATININE 0.86  CALCIUM 9.1   Liver Function Tests:  Recent Labs Lab 01/13/14 1052  AST 15  ALT 13  ALKPHOS 87  BILITOT 0.4  PROT 6.7  ALBUMIN 3.2*   No results found for this basename: LIPASE, AMYLASE,  in the last 168 hours No results found for this basename: AMMONIA,  in the last 168 hours CBC:  Recent Labs Lab 01/13/14 1052  WBC 6.9  NEUTROABS 4.0  HGB 14.5  HCT 43.9  MCV 87.5  PLT 207   Cardiac Enzymes: No results found for this basename: CKTOTAL, CKMB, CKMBINDEX, TROPONINI,  in the last 168 hours  BNP (last 3 results) No results found for this basename: PROBNP,  in the last 8760 hours CBG: No results found for this basename: GLUCAP,  in the last 168  hours  Radiological Exams on Admission: Ct Head Wo Contrast  01/13/2014   CLINICAL DATA:  Facial droop about the left eye.  EXAM: CT HEAD WITHOUT CONTRAST  TECHNIQUE: Contiguous axial images were obtained from the base of the skull through the vertex without intravenous contrast.  COMPARISON:  None.  FINDINGS: There is no evidence of acute intracranial abnormality including infarct, hemorrhage, mass lesion, mass effect, midline shift or abnormal extra-axial fluid collection. No hydrocephalus or pneumocephalus. Imaged paranasal sinuses and mastoid air cells are clear. Carotid atherosclerosis noted.  IMPRESSION: No acute finding.   Electronically Signed   By: Inge Rise M.D.   On: 01/13/2014 11:20    EKG: Independently reviewed. LBBB unchanged  Assessment/Plan Principal Problem:   Expressive aphasia -3 episodes in the last week -Neuro following -check MRI, EEG -ASA 325mg  daily -further workup depending on workup    DM -SSI    HYPERTENSION -continue hydralazine, imdur, coreg    H/o CHF -EF 25-30% on ECHO 10/14, compensated -continue coreg  DVT proph: lovenox  Code Status: Full Code DVT Prophylaxis: lovenox Family Communication: d/w spouse at bedside Disposition Plan: Home pending workup  Time spent: 67min  Sophiamarie Nease [facility] Pager [phone]  **Disclaimer: This note may have been dictated with voice recognition software. Similar sounding words can inadvertently be transcribed and this note may contain transcription errors which may not have been corrected upon publication of note.**

## 2014-01-13 NOTE — ED Notes (Signed)
Pt transported to EEG, pt to be transported to 4N upon completion of EEG

## 2014-01-13 NOTE — ED Provider Notes (Signed)
CSN: [id number]     Arrival date & time 01/13/14  0946 History   First MD Initiated Contact with Patient 01/13/14 1001     Chief Complaint  Patient presents with  . Transient Ischemic Attack     (Consider location/radiation/quality/duration/timing/severity/associated sxs/prior Treatment) HPI 79 year old female presents with intermittent episodes where she has trouble speaking and appears confused per her husband. The first episode happened approximately 10 days ago. His episodes last a total about 15 minutes. For one to 3 minutes the patient has an inability to get any words out. She states she knows were she was to get out but is like her mouth all speak for him. She then speaks very slow and appears off according to her husband. This then completely resolved. She had another episode one week ago and another episode yesterday. Denies headaches, dizziness, blurry vision, chest pain, or weakness. His episodes all last about the same amount of time. She's about to go on a trip tomorrow to want to get this checked out first. She's concerned she is having TIAs.  Past Medical History  Diagnosis Date  . Arthritis   . Diabetes mellitus   . Headache(784.0)   . GERD (gastroesophageal reflux disease)   . Ulcer   . Hyperlipidemia   . Migraine   . Colon polyps   . Thyroid disease   . Shingles   . Urine incontinence   . Goiter past remote    treated with RI  . Other primary cardiomyopathies   . Hyperpotassemia   . Inflammatory and toxic neuropathy, unspecified   . Reflex sympathetic dystrophy, unspecified   . Hypertonicity of bladder   . Hypertension   . Shortness of breath     ambulation  . Migraine     hx of  . Vertigo     hx of  . Overactive bladder    Past Surgical History  Procedure Laterality Date  . Breast surgery  19599    breast cyst  . Finger surgery  [place]  . Tubal ligation  1976  . Kidney donation  1989    left kidney  . Spine surgery  1999  . Ct scan  3/12     outside hosp- lung nodule and gallstones  . Upper gastrointestinal endoscopy    . Colonoscopy  7/12    normal (hx of polyps in past)   . Back surgery    . Eye surgery      cataract removal in left eye; several eye injections  . Lumbar laminectomy/decompression microdiscectomy  01/31/2012    Procedure: LUMBAR LAMINECTOMY/DECOMPRESSION MICRODISCECTOMY 2 LEVELS;  Surgeon: Eustace Moore, MD;  Location: [facility];  Service: Neurosurgery;  Laterality: N/A;  Thoracic twelve-lumbar one, lumbar one-two laminectomy   . Cardiac catheterization      [place]   Family History  Problem Relation Age of Onset  . Arthritis Mother   . Cancer Mother     uterine and mouth  . Hyperlipidemia Mother   . Stroke Mother   . Hypertension Mother   . Alcohol abuse Father   . Diabetes Father   . Cancer Sister     breast  . Diabetes Sister   . Cancer Brother     lung cancer  . Kidney disease Brother   . Diabetes Brother   . Hyperlipidemia Sister   . Heart disease Sister   . Hypertension Sister   . Diabetes Sister   . Hyperlipidemia Brother   . Kidney failure  Brother   . Diabetes Daughter    History  Substance Use Topics  . Smoking status: Never Smoker   . Smokeless tobacco: Never Used  . Alcohol Use: No   OB History   Grav Para Term Preterm Abortions TAB SAB Ect Mult Living                 Review of Systems  Constitutional: Negative for fever.  Eyes: Negative for visual disturbance.  Cardiovascular: Negative for chest pain.  Gastrointestinal: Negative for vomiting.  Neurological: Positive for speech difficulty. Negative for dizziness, weakness, numbness and headaches.  All other systems reviewed and are negative.     Allergies  Ace inhibitors; Codeine; Lisinopril; Nsaids; and Tylenol  Home Medications   Prior to Admission medications   Medication Sig Start Date End Date Taking? Authorizing Provider  aspirin 81 MG tablet Take 81 mg by mouth daily.      Historical Provider, MD   carvedilol (COREG) 12.5 MG tablet Take 1 tablet (12.5 mg total) by mouth 2 (two) times daily with a meal. 08/03/13   Minna Merritts, MD  cholecalciferol (VITAMIN D) 1000 UNITS tablet Take 1,000 Units by mouth daily.    Historical Provider, MD  gabapentin (NEURONTIN) 300 MG capsule Take 1 capsule (300 mg total) by mouth 3 (three) times daily. 12/16/12   Abner Greenspan, MD  glimepiride (AMARYL) 2 MG tablet Take 1 tablet (2 mg total) by mouth 2 (two) times daily. 11/08/13   Abner Greenspan, MD  hydrALAZINE (APRESOLINE) 25 MG tablet Take 2 tablets (50 mg total) by mouth 3 (three) times daily. 08/03/13   Minna Merritts, MD  HYDROcodone-acetaminophen (NORCO/VICODIN) 5-325 MG per tablet Take 1 tablet by mouth every 6 (six) hours as needed. For pain 10/13/13   Abner Greenspan, MD  isosorbide mononitrate (IMDUR) 30 MG 24 hr tablet Take 1 tablet (30 mg total) by mouth daily. 08/03/13   Minna Merritts, MD  metFORMIN (GLUCOPHAGE-XR) 500 MG 24 hr tablet TAKE TWO TABLETS BY MOUTH TWICE DAILY 10/14/12   Abner Greenspan, MD  MILK THISTLE PO Take 1 capsule by mouth daily.    Historical Provider, MD  mirabegron ER ([name]) 50 MG TB24 Take by mouth daily.    Historical Provider, MD  pantoprazole (PROTONIX) 40 MG tablet Take 1 tablet (40 mg total) by mouth daily. 11/08/13   Abner Greenspan, MD  traMADol (ULTRAM) 50 MG tablet Take 1 tablet (50 mg total) by mouth daily as needed. 04/28/13   [name], MD  vitamin B-12 (CYANOCOBALAMIN) 1000 MCG tablet Take 1,000 mcg by mouth daily.    Historical Provider, MD   BP 142/69  Pulse 73  Temp(Src) 97.6 F (36.4 C) (Oral)  Resp 15  Ht 5\' 2"  (1.575 m)  Wt 154 lb (69.854 kg)  BMI 28.16 kg/m2  SpO2 98%  LMP 06/03/1992 Physical Exam  Nursing note and vitals reviewed. Constitutional: She is oriented to person, place, and time. She appears well-developed and well-nourished.  HENT:  Head: Normocephalic and atraumatic.  Right Ear: External ear normal.  Left Ear: External ear  normal.  Nose: Nose normal.  Eyes: EOM are normal. Pupils are equal, round, and reactive to light. Right eye exhibits no discharge. Left eye exhibits no discharge.  Cardiovascular: Normal rate, regular rhythm and normal heart sounds.   Pulmonary/Chest: Effort normal and breath sounds normal.  Abdominal: Soft. She exhibits no distension. There is no tenderness.  Neurological: She is alert and  oriented to person, place, and time.  CN 2-12 grossly intact. 5/5 strength in all 4 extremities. Clear and normal speech  Skin: Skin is warm and dry.    ED Course  Procedures (including critical care time) Labs Review Labs Reviewed  DIFFERENTIAL - Abnormal; Notable for the following:    Eosinophils Relative 6 (*)    All other components within normal limits  COMPREHENSIVE METABOLIC PANEL - Abnormal; Notable for the following:    Glucose, Bld 146 (*)    Albumin 3.2 (*)    GFR calc non Af Amer 64 (*)    GFR calc Af Amer 74 (*)    All other components within normal limits  URINALYSIS, ROUTINE W REFLEX MICROSCOPIC - Abnormal; Notable for the following:    Protein, ur 100 (*)    Nitrite POSITIVE (*)    Leukocytes, UA MODERATE (*)    All other components within normal limits  URINE MICROSCOPIC-ADD ON - Abnormal; Notable for the following:    Bacteria, UA FEW (*)    All other components within normal limits  URINE CULTURE  ETHANOL  PROTIME-INR  APTT  CBC  URINE RAPID DRUG SCREEN (HOSP PERFORMED)  I-STAT TROPOININ, ED    Imaging Review Ct Head Wo Contrast  01/13/2014   CLINICAL DATA:  Facial droop about the left eye.  EXAM: CT HEAD WITHOUT CONTRAST  TECHNIQUE: Contiguous axial images were obtained from the base of the skull through the vertex without intravenous contrast.  COMPARISON:  None.  FINDINGS: There is no evidence of acute intracranial abnormality including infarct, hemorrhage, mass lesion, mass effect, midline shift or abnormal extra-axial fluid collection. No hydrocephalus or  pneumocephalus. Imaged paranasal sinuses and mastoid air cells are clear. Carotid atherosclerosis noted.  IMPRESSION: No acute finding.   Electronically Signed   By: Inge Rise M.D.   On: 01/13/2014 11:20     EKG Interpretation   Date/Time:  Thursday January 13 2014 09:55:32 EDT Ventricular Rate:  78 PR Interval:  159 QRS Duration: 149 QT Interval:  421 QTC Calculation: 480 R Axis:   -72 Text Interpretation:  Sinus rhythm Biatrial enlargement Nonspecific IVCD  with LAD LVH with secondary repolarization abnormality Anterior infarct,  old no significant change from 2013 Confirmed by Katherina Wimer  MD, [place]  ([id number]) on 01/13/2014 10:02:13 AM      MDM   Final diagnoses:  Expressive aphasia    Patient with atypical expressive aphasia multiple times over last couple weeks. Neurologic exam is normal now. Concern for TIA versus seizures. Neurology consult in, they are requesting MRI and EEG and admission to the hospitalist. Patient does have chronic urinary frequency this not worse now but no dysuria. We'll send urine for culture given her minimal white blood cells on microscopic exam.    Ephraim Hamburger, MD 01/13/14 [phone]

## 2014-01-13 NOTE — Telephone Encounter (Signed)
Pt called wants to see dr Rockey Situ, states she is having confusion when she tries speak, not slurred speech, would like to talk with a nurse.

## 2014-01-14 ENCOUNTER — Inpatient Hospital Stay (HOSPITAL_COMMUNITY): Payer: Medicare Other

## 2014-01-14 DIAGNOSIS — I6350 Cerebral infarction due to unspecified occlusion or stenosis of unspecified cerebral artery: Secondary | ICD-10-CM

## 2014-01-14 DIAGNOSIS — I6330 Cerebral infarction due to thrombosis of unspecified cerebral artery: Principal | ICD-10-CM | POA: Insufficient documentation

## 2014-01-14 DIAGNOSIS — I517 Cardiomegaly: Secondary | ICD-10-CM

## 2014-01-14 DIAGNOSIS — N3946 Mixed incontinence: Secondary | ICD-10-CM

## 2014-01-14 DIAGNOSIS — N390 Urinary tract infection, site not specified: Secondary | ICD-10-CM

## 2014-01-14 LAB — HEMOGLOBIN A1C
HEMOGLOBIN A1C: 8.9 % — AB (ref <5.7)
Mean Plasma Glucose: 209 mg/dL — ABNORMAL HIGH (ref <117)

## 2014-01-14 LAB — BASIC METABOLIC PANEL
Anion gap: 13 (ref 5–15)
BUN: 15 mg/dL (ref 6–23)
CHLORIDE: 101 meq/L (ref 96–112)
CO2: 29 meq/L (ref 19–32)
CREATININE: 0.91 mg/dL (ref 0.50–1.10)
Calcium: 8.9 mg/dL (ref 8.4–10.5)
GFR calc non Af Amer: 59 mL/min — ABNORMAL LOW (ref >90)
GFR, EST AFRICAN AMERICAN: 69 mL/min — AB (ref >90)
Glucose, Bld: 295 mg/dL — ABNORMAL HIGH (ref 70–99)
POTASSIUM: 4.8 meq/L (ref 3.7–5.3)
Sodium: 143 mEq/L (ref 137–147)

## 2014-01-14 LAB — CBC
HEMATOCRIT: 43.5 % (ref 36.0–46.0)
HEMOGLOBIN: 14.4 g/dL (ref 12.0–15.0)
MCH: 29.8 pg (ref 26.0–34.0)
MCHC: 33.1 g/dL (ref 30.0–36.0)
MCV: 90.1 fL (ref 78.0–100.0)
Platelets: 207 10*3/uL (ref 150–400)
RBC: 4.83 MIL/uL (ref 3.87–5.11)
RDW: 12.7 % (ref 11.5–15.5)
WBC: 9 10*3/uL (ref 4.0–10.5)

## 2014-01-14 LAB — LIPID PANEL
CHOL/HDL RATIO: 5.7 ratio
Cholesterol: 250 mg/dL — ABNORMAL HIGH (ref 0–200)
HDL: 44 mg/dL (ref >39)
LDL CALC: 150 mg/dL — AB (ref 0–99)
TRIGLYCERIDES: 278 mg/dL — AB (ref <150)
VLDL: 56 mg/dL — ABNORMAL HIGH (ref 0–40)

## 2014-01-14 LAB — GLUCOSE, CAPILLARY
GLUCOSE-CAPILLARY: 229 mg/dL — AB (ref 70–99)
GLUCOSE-CAPILLARY: 252 mg/dL — AB (ref 70–99)

## 2014-01-14 MED ORDER — ATORVASTATIN CALCIUM 10 MG PO TABS
20.0000 mg | ORAL_TABLET | Freq: Every day | ORAL | Status: DC
  Administered 2014-01-14: 20 mg via ORAL
  Filled 2014-01-14: qty 2

## 2014-01-14 MED ORDER — INSULIN ASPART 100 UNIT/ML ~~LOC~~ SOLN
3.0000 [IU] | Freq: Once | SUBCUTANEOUS | Status: AC
  Administered 2014-01-14: 3 [IU] via SUBCUTANEOUS

## 2014-01-14 MED ORDER — INSULIN ASPART 100 UNIT/ML ~~LOC~~ SOLN
0.0000 [IU] | Freq: Three times a day (TID) | SUBCUTANEOUS | Status: DC
  Administered 2014-01-14: 3 [IU] via SUBCUTANEOUS
  Administered 2014-01-15: 2 [IU] via SUBCUTANEOUS
  Administered 2014-01-15: 7 [IU] via SUBCUTANEOUS

## 2014-01-14 NOTE — Progress Notes (Signed)
*  PRELIMINARY RESULTS* Vascular Ultrasound Carotid Duplex (Doppler) has been completed.  Preliminary findings: Bilateral:  1-39% ICA stenosis.  Vertebral artery flow is antegrade.      Landry Mellow, RDMS, RVT  01/14/2014, 2:26 PM

## 2014-01-14 NOTE — Progress Notes (Signed)
[facility]                                                                              Patient Demographics  Morgan Hubbard, is a 79 y.o. female, DOB - 1934-12-02, [id number]  Admit date - 01/13/2014   Admitting Physician Domenic Polite, MD  Outpatient Primary MD for the patient is [name], MD  LOS - 1   Chief Complaint  Patient presents with  . Transient Ischemic Attack     HPI on 01/13/2014 Morgan Hubbard is a 79 y.o. female with PMH of DM, HTN, Chronic systolic CHF EF [id number] presented with speech problems.  She reported 3 episodes in the last 10days, last one being 01/12/14, during which she had acute onset of difficulty with expressing words and word finding difficulty. These episodes lasted for about couple of minutes and were followed by a period during which her speech was forced and different and then returned to normal. She spoke to one of her RN friends, who was concerned about a TIA and sent her to the ER for evaluation. At the time of admission, patient was asymptomatic. Patient was seen by neurology in the emergency department and ordered an EEG and MRI.  Assessment & Plan   Acute CVA/Expressive Aphasia -CT of the head showed no acute finding -MRI brain: Two acute deep white matter infarctions in the left parietal lobe consistent with small vessel infarctions. -EEG: normal awake and asleep EEG. The presence of intermittent left temporal theta slowing can indicate focal neuronal dysfunction, but in this particular age population can also be a normal phenomenon.  -Pending echocardiogram, carotid Doppler, lipid panel, hemoglobin A1c -Neurology consulted and appreciated -Pending PT, OT, speech evaluations -Continue aspirin 25 mg daily for secondary stroke prevention  Diabetes mellitus, type 2 with neuropathy -Medication, metformin and Amaryl currently held -Continue sliding insulin scale with CBG monitoring -Hemoglobin A1c pending -Continue Neurontin for  neuropathy  Essential Hypertension -Currently controlled -Continue Coreg, hydralazine, Imdur lower  Chronic systolic heart failure -Echocardiogram October 2014 shows an EF of [id number], grade 1 diastolic dysfunction -Currently pending echocardiogram -Continue daily weights, monitor intake and output -Currently compensated]  UTI with overactive bladder -UA: WBC 3-6, positive nitrites, moderate leukocytes -Urine culture: >100K Ecoli -Patient has overactive bladder and is enrolled in PTNS and sees a urologist -Will place on ceftriaxone  Code Status: Full  Family Communication: Husband at bedside  Disposition Plan: Admitted, pending further workup  Time Spent in minutes   30 minutes  Procedures  None  Consults   Neurology  DVT Prophylaxis  Lovenox  Lab Results  Component Value Date   PLT 207 01/14/2014    Medications  Scheduled Meds: . aspirin  325 mg Oral Daily  . carvedilol  12.5 mg Oral BID WC  . enoxaparin (LOVENOX) injection  40 mg Subcutaneous Q24H  . gabapentin  300 mg Oral TID  . hydrALAZINE  50 mg Oral TID  . isosorbide mononitrate  30 mg Oral Daily  . mirabegron ER  50 mg Oral Daily  . pantoprazole  40 mg Oral QAC breakfast  . vitamin B-12  1,000 mcg Oral Daily   Continuous Infusions:  PRN Meds:.HYDROcodone-acetaminophen,  ondansetron (ZOFRAN) IV, ondansetron, traMADol  Antibiotics    Anti-infectives   None      Subjective:   Morgan Hubbard seen and examined today.  Patient currently has no complaints. She would like to eat breakfast. Denies any further speech problems, visual disturbances, dizziness, headache, chest pain, shortness of breath. Does complain of some dysuria.   Objective:   Filed Vitals:   01/14/14 0300 01/14/14 0500 01/14/14 0900 01/14/14 0940  BP: 139/64 121/57 137/67 138/62  Pulse: 74 63 66 78  Temp:    98.2 F (36.8 C)  TempSrc:    Oral  Resp: 16 20  20   Height:      Weight:      SpO2: 99% 97%  97%    Wt Readings from  Last 3 Encounters:  01/13/14 69.854 kg (154 lb)  12/23/13 69.854 kg (154 lb)  08/03/13 72.235 kg (159 lb 4 oz)    No intake or output data in the 24 hours ending 01/14/14 1207  Exam  General: Well developed, well nourished, NAD, appears stated age  HEENT: NCAT, PERRLA, EOMI, Anicteic Sclera, mucous membranes moist.   Neck: Supple, no JVD, no masses  Cardiovascular: S1 S2 auscultated, no rubs, murmurs or gallops. Regular rate and rhythm.  Respiratory: Clear to auscultation bilaterally with equal chest rise  Abdomen: Soft, nontender, nondistended, + bowel sounds  Extremities: warm dry without cyanosis clubbing or edema  Neuro: AAOx3, cranial nerves grossly intact. Strength 5/5 in patient's upper and lower extremities bilaterally  Skin: Without rashes exudates or nodules  Psych: Normal affect and demeanor with intact judgement and insight    Data Review   Micro Results Recent Results (from the past 240 hour(s))  URINE CULTURE     Status: None   Collection Time    01/13/14 11:00 AM      Result Value Ref Range Status   Specimen Description URINE, CLEAN CATCH   Final   Special Requests ADDED AT 1335 ON [id number]   Final   Culture  Setup Time     Final   Value: 01/13/2014 17:38     Performed at [place]     Final   Value: >=100,000 COLONIES/ML     Performed at [facility]   Culture     Final   Value: ESCHERICHIA COLI     Performed at [facility]   Report Status PENDING   Incomplete    Radiology Reports Ct Head Wo Contrast  01/13/2014   CLINICAL DATA:  Facial droop about the left eye.  EXAM: CT HEAD WITHOUT CONTRAST  TECHNIQUE: Contiguous axial images were obtained from the base of the skull through the vertex without intravenous contrast.  COMPARISON:  None.  FINDINGS: There is no evidence of acute intracranial abnormality including infarct, hemorrhage, mass lesion, mass effect, midline shift or abnormal extra-axial fluid  collection. No hydrocephalus or pneumocephalus. Imaged paranasal sinuses and mastoid air cells are clear. Carotid atherosclerosis noted.  IMPRESSION: No acute finding.   Electronically Signed   By: Inge Rise M.D.   On: 01/13/2014 11:20   Mr Brain Wo Contrast  01/14/2014   CLINICAL DATA:  Acute onset of speech disturbance.  EXAM: MRI HEAD WITHOUT CONTRAST  TECHNIQUE: Multiplanar, multiecho pulse sequences of the brain and surrounding structures were obtained without intravenous contrast.  COMPARISON:  Head CT 01/13/2014  FINDINGS: Diffusion imaging shows 2 tiny foci of restricted diffusion in the left parietal deep white matter.  No evidence of cortical or large vessel infarction.  The brainstem and cerebellum are normal. The cerebral hemispheres otherwise show minimal small vessel change of the cerebral hemispheric white matter. No mass lesion, hemorrhage, hydrocephalus or extra-axial collection. No pituitary mass. No inflammatory sinus disease. No skull or skullbase lesion. Major vessels at the base of the brain show flow.  Incidental note is made of arthropathy of the C1-2 articulation with narrowing of the spinal canal.  IMPRESSION: Two acute deep white matter infarctions in the left parietal lobe consistent with small vessel infarctions.   Electronically Signed   By: Nelson Chimes M.D.   On: 01/14/2014 09:03    CBC  Recent Labs Lab 01/13/14 1052 01/14/14 0330  WBC 6.9 9.0  HGB 14.5 14.4  HCT 43.9 43.5  PLT 207 207  MCV 87.5 90.1  MCH 28.9 29.8  MCHC 33.0 33.1  RDW 12.7 12.7  LYMPHSABS 2.1  --   MONOABS 0.5  --   EOSABS 0.4  --   BASOSABS 0.0  --     Chemistries   Recent Labs Lab 01/13/14 1052 01/14/14 0330  NA 142 143  K 5.0 4.8  CL 102 101  CO2 29 29  GLUCOSE 146* 295*  BUN 18 15  CREATININE 0.86 0.91  CALCIUM 9.1 8.9  AST 15  --   ALT 13  --   ALKPHOS 87  --   BILITOT 0.4  --     ------------------------------------------------------------------------------------------------------------------ estimated creatinine clearance is 47.4 ml/min (by C-G formula based on Cr of 0.91). ------------------------------------------------------------------------------------------------------------------ No results found for this basename: HGBA1C,  in the last 72 hours ------------------------------------------------------------------------------------------------------------------ No results found for this basename: CHOL, HDL, LDLCALC, TRIG, CHOLHDL, LDLDIRECT,  in the last 72 hours ------------------------------------------------------------------------------------------------------------------ No results found for this basename: TSH, T4TOTAL, FREET3, T3FREE, THYROIDAB,  in the last 72 hours ------------------------------------------------------------------------------------------------------------------ No results found for this basename: VITAMINB12, FOLATE, FERRITIN, TIBC, IRON, RETICCTPCT,  in the last 72 hours  Coagulation profile  Recent Labs Lab 01/13/14 1052  INR 0.98    No results found for this basename: DDIMER,  in the last 72 hours  Cardiac Enzymes No results found for this basename: CK, CKMB, TROPONINI, MYOGLOBIN,  in the last 168 hours ------------------------------------------------------------------------------------------------------------------ No components found with this basename: POCBNP,     Caillou Minus D.O. on 01/14/2014 at 12:07 PM  Between 7am to 7pm - Pager - [phone]  After 7pm go to [email or web address] - password [email or web address]  And look for the night coverage person covering for me after hours  [facility] Office  [phone]

## 2014-01-14 NOTE — Procedures (Signed)
EEG report.  Brief clinical history: 79 YO female with three episodes of speech arrest followed by sensation of abnormal sensorium. No prior history of frank epileptic seizures.  Technique: this is a 17 channel routine scalp EEG performed at the bedside with bipolar and monopolar montages arranged in accordance to the international 10/20 system of electrode placement. One channel was dedicated to EKG recording.  The study was performed during wakefulness, drowsiness, and stage 2 sleep. No activating procedures performed.  Description:In the wakeful state, the best background consisted of a medium amplitude, posterior dominant, well sustained, symmetric and reactive 11 Hz rhythm. Drowsiness demonstrated dropout of the alpha rhythm. Stage 2 sleep showed symmetric and synchronous sleep spindles without intermixed epileptiform discharges. No focal or generalized epileptiform discharges noted.  Intermittent left temporal theta slowing was noted. EKG showed sinus rhythm.  Impression: this is a normal awake and asleep EEG. The presence of intermittent left temporal theta slowing can indicate focal neuronal dysfunction, but in this particular age population can also be a normal phenomenon.  Please, correlate clinically.  Dorian Pod, MD

## 2014-01-14 NOTE — Progress Notes (Signed)
Admitted with confusion with speech, possible TIA. Has history of DM, CHF.  Noted that lab blood sugar this am was 295 mg/dl.  Recommend checking CBGs before meals and HS, and starting Novolog SENSITIVE correction scale AC & HS while in the hospital.  Patient does take Amaryl and Metformin at home.  Could check HgbA1C while in hospital.  Will continue to follow.  Harvel Ricks RN BSN CDE

## 2014-01-14 NOTE — Evaluation (Signed)
Speech Language Pathology Evaluation Patient Details Name: Morgan Hubbard MRN: [id number] DOB: 1935-02-01 Today's Date: 01/14/2014 Time: [id number] SLP Time Calculation (min): 22 min  Problem List:  Patient Active Problem List   Diagnosis Date Noted  . Cerebral thrombosis with cerebral infarction 01/14/2014  . Expressive aphasia 01/13/2014  . Nonischemic cardiomyopathy 04/05/2013  . Shortness of breath 09/03/2012  . ETD (eustachian tube dysfunction) 06/16/2012  . Cerumen impaction 06/16/2012  . Gallstones 05/26/2012  . Elevated transaminase level 05/18/2012  . Spinal stenosis of lumbar region 12/10/2011  . Mixed incontinence urge and stress 12/10/2011  . Renal insufficiency 11/04/2011  . Goiter 01/14/2011  . Tongue swelling 12/01/2010  . Fatty liver 08/28/2010  . DM 07/09/2010  . HYPERCHOLESTEROLEMIA 07/09/2010  . HYPERKALEMIA 07/09/2010  . REFLEX SYMPATHETIC DYSTROPHY 07/09/2010  . POLYNEUROPATHY 07/09/2010  . HYPERTENSION 07/09/2010  . CARDIOMYOPATHY 07/09/2010  . OVERACTIVE BLADDER 07/09/2010   Past Medical History:  Past Medical History  Diagnosis Date  . GERD (gastroesophageal reflux disease)   . Ulcer   . Hyperlipidemia   . Colon polyps   . Thyroid disease   . Shingles   . Urine incontinence   . Goiter past remote    treated with RI  . Other primary cardiomyopathies   . Hyperpotassemia   . Inflammatory and toxic neuropathy, unspecified   . Reflex sympathetic dystrophy, unspecified   . Hypertonicity of bladder   . Hypertension   . Shortness of breath     ambulation  . Vertigo     hx of  . Overactive bladder   . LBBB (left bundle branch block)   . Cardiac LV ejection fraction >40%     "it was 43 last year" (01/13/2014)  . Type II diabetes mellitus   . Migraine     "stopped many years ago" (01/13/2014)  . Arthritis     "hands" (01/13/2014)  . Chronic lower back pain   . Basal cell carcinoma 01/2014    "bridge of nose"  . Expressive aphasia     "3 times  in the last week" (01/13/2014)   Past Surgical History:  Past Surgical History  Procedure Laterality Date  . Finger surgery Left [place]    "knot on index"  . Kidney donation Left 1989  . Lumbar laminectomy/decompression microdiscectomy  1999  . Ct scan  3/12    outside hosp- lung nodule and gallstones  . Upper gastrointestinal endoscopy    . Colonoscopy  7/12    normal (hx of polyps in past)   . Back surgery    . Cataract extraction w/ intraocular lens implant Left ~ 2005    several eye injections  . Lumbar laminectomy/decompression microdiscectomy  01/31/2012    Procedure: LUMBAR LAMINECTOMY/DECOMPRESSION MICRODISCECTOMY 2 LEVELS;  Surgeon: Eustace Moore, MD;  Location: [facility];  Service: Neurosurgery;  Laterality: N/A;  Thoracic twelve-lumbar one, lumbar one-two laminectomy   . Tubal ligation  1976  . Breast cyst excision Left 1959  . Posterior laminectomy / decompression cervical spine  1999  . Cardiac catheterization  "several"    Baldo Ash   HPI:  Morgan Hubbard is a 79 y.o. female with PMH of DM, HTN, and Chronic systolic CHF, who presented with 3 episodes (in the last 10 days) of difficulty expressing herself. MRI revealed 2 left parietal lobe infarcts.    Assessment / Plan / Recommendation Clinical Impression  Pt appears to be at her cognitive-linguistic baseline. Pt and her granddaughter present are in  agreement and believe that all symptoms of difficulty communicating have resolved. No further SLP f/u is recommended at this time.    SLP Assessment  Patient does not need any further Speech Lanaguage Pathology Services    Follow Up Recommendations  None    Frequency and Duration        Pertinent Vitals/Pain Pain Assessment: No/denies pain   SLP Goals     SLP Evaluation Prior Functioning  Cognitive/Linguistic Baseline: Within functional limits  Lives With: Spouse Available Help at Discharge: Family Vocation: Retired   Associate Professor  Overall Cognitive  Status: Within Functional Limits for tasks assessed Orientation Level: Oriented X4    Comprehension  Auditory Comprehension Overall Auditory Comprehension: Appears within functional limits for tasks assessed Visual Recognition/Discrimination Discrimination: Within Function Limits Reading Comprehension Reading Status: Not tested    Expression Expression Primary Mode of Expression: Verbal Verbal Expression Overall Verbal Expression: Appears within functional limits for tasks assessed Written Expression Written Expression: Not tested   Oral / Motor Oral Motor/Sensory Function Overall Oral Motor/Sensory Function: Appears within functional limits for tasks assessed Motor Speech Overall Motor Speech: Appears within functional limits for tasks assessed   GO      Germain Osgood, M.A. CCC-SLP [phone]  Germain Osgood 01/14/2014, 3:42 PM

## 2014-01-14 NOTE — Evaluation (Signed)
Physical Therapy Evaluation/Discharge Patient Details Name: Morgan Hubbard MRN: [id number] DOB: 1935-05-31 Today's Date: 01/14/2014   History of Present Illness  79 y.o. female admitted to [facility] on 01/13/14 with difficulty talking.  Stroke workup initiated and MRI revealed, "Two acute deep white matter infarctions in the left parietal lobe consistent with small vessel infarctions."  Pt with significant PMhx of HTN, vertigo, bladder incontinence, LBBB, DM, and multiple lumbar surgeries.   Clinical Impression  Pt is mobilizing at her baseline with no new focal deficits found by PT.  PT completed FAST education.  PT to sign off as there are no current acute or f/u PT needs at this time.      Follow Up Recommendations No PT follow up    Equipment Recommendations  None recommended by PT    Recommendations for Other Services   NA    Precautions / Restrictions Precautions Precautions: Fall Precaution Comments: only mildly unsteady on her feet.  This is her baseline.       Mobility     Transfers Overall transfer level: Modified independent               General transfer comment: uses her hands for transitions  Ambulation/Gait Ambulation/Gait assistance: Supervision Ambulation Distance (Feet): 200 Feet Assistive device: None Gait Pattern/deviations: Step-through pattern;Shuffle;Staggering left;Antalgic Gait velocity: decreased   General Gait Details: Pt with abnormal gait pattern, but pt reports this is her baseline due to her multiple back surgeries.  She also became DOE 2/4 with gait, which she reports is also her normal and why she uses a RW in the community.     Modified Rankin (Stroke Patients Only) Modified Rankin (Stroke Patients Only) Pre-Morbid Rankin Score: No symptoms Modified Rankin: No symptoms     Balance Overall balance assessment: Needs assistance Sitting-balance support: Feet supported;No upper extremity supported Sitting balance-Leahy Scale: Normal      Standing balance support: No upper extremity supported Standing balance-Leahy Scale: Good                               Pertinent Vitals/Pain Pain Assessment: No/denies pain (chronic back pain present after gait. )    Home Living Family/patient expects to be discharged to:: Private residence Living Arrangements: Spouse/significant other Available Help at Discharge: Family                  Prior Function Level of Independence: Independent;Independent with assistive device(s)         Comments: uses Rollator when out in the community more due to SOB at baseline than due to back, but she reports it is a combo of both.         Extremity/Trunk Assessment   Upper Extremity Assessment: Overall WFL for tasks assessed           Lower Extremity Assessment: Overall WFL for tasks assessed (strength, sensation, and coordination testing. )      Cervical / Trunk Assessment: Other exceptions  Communication   Communication: No difficulties  Cognition Arousal/Alertness: Awake/alert Behavior During Therapy: WFL for tasks assessed/performed Overall Cognitive Status: Within Functional Limits for tasks assessed                      General Comments General comments (skin integrity, edema, etc.): Educated pt on stroke educatio FAST          Assessment/Plan    PT Assessment Patent does not need any further  PT services           PT Goals (Current goals can be found in the Care Plan section) Acute Rehab PT Goals PT Goal Formulation: No goals set, d/c therapy     End of Session Equipment Utilized During Treatment: Gait belt Activity Tolerance: Other (comment) (limited by DOE) Patient left: in chair;with call bell/phone within reach;with family/visitor present           Time: [id number] PT Time Calculation (min): 15 min   Charges:   PT Evaluation $Initial PT Evaluation Tier I: 1 Procedure PT Treatments $Gait Training: 8-22 mins         Yulanda Diggs B. [place], [place], DPT [phone]   01/14/2014, 4:18 PM

## 2014-01-14 NOTE — Progress Notes (Signed)
Stroke Team Progress Note   HISTORY Morgan Hubbard is an 79 y.o. female who has noted over the past week to have three separate incidences which she will try to speak but finds herself unable to get her words out. She states this only lasts for a few seconds and then after she feels as though her head feels funny. Currently she is back to her baseline. Her last episode was yesterday. She has come to the ED today due to fear this may be TIA or something of concern. She states she takes a asprin daily. Patient was not administered TPA secondary to delay in arrival. She was admitted for further evaluation and treatment.   INTERVAL HISTORY (SUBJECTIVE)  Her husband is at the bedside.  Overall she feels her condition is stable. Patient confirms history that she is had 3  transient episodes of expressive language and speech difficulties over the last 10 days with the last one being 2 days ago. She denies any prior history of strokes or TIAs but does have vascular risk factors   OBJECTIVE Temp:  [97.9 F (36.6 C)-98.4 F (36.9 C)] 98.2 F (36.8 C) (08/14 0940) Pulse Rate:  [63-81] 78 (08/14 0940) Resp:  [16-24] 20 (08/14 0940) BP: (121-166)/(53-112) 138/62 mmHg (08/14 0940) SpO2:  [89 %-99 %] 97 % (08/14 0940)  Ct Head Wo Contrast  01/13/2014   CLINICAL DATA:  Facial droop about the left eye.  EXAM: CT HEAD WITHOUT CONTRAST  TECHNIQUE: Contiguous axial images were obtained from the base of the skull through the vertex without intravenous contrast.  COMPARISON:  None.  FINDINGS: There is no evidence of acute intracranial abnormality including infarct, hemorrhage, mass lesion, mass effect, midline shift or abnormal extra-axial fluid collection. No hydrocephalus or pneumocephalus. Imaged paranasal sinuses and mastoid air cells are clear. Carotid atherosclerosis noted.  IMPRESSION: No acute finding.   Electronically Signed   By: Inge Rise M.D.   On: 01/13/2014 11:20   Mr Brain Wo  Contrast  01/14/2014   CLINICAL DATA:  Acute onset of speech disturbance.  EXAM: MRI HEAD WITHOUT CONTRAST  TECHNIQUE: Multiplanar, multiecho pulse sequences of the brain and surrounding structures were obtained without intravenous contrast.  COMPARISON:  Head CT 01/13/2014  FINDINGS: Diffusion imaging shows 2 tiny foci of restricted diffusion in the left parietal deep white matter. No evidence of cortical or large vessel infarction.  The brainstem and cerebellum are normal. The cerebral hemispheres otherwise show minimal small vessel change of the cerebral hemispheric white matter. No mass lesion, hemorrhage, hydrocephalus or extra-axial collection. No pituitary mass. No inflammatory sinus disease. No skull or skullbase lesion. Major vessels at the base of the brain show flow.  Incidental note is made of arthropathy of the C1-2 articulation with narrowing of the spinal canal.  IMPRESSION: Two acute deep white matter infarctions in the left parietal lobe consistent with small vessel infarctions.   Electronically Signed   By: Nelson Chimes M.D.   On: 01/14/2014 09:03   EEG this is a normal awake and asleep EEG. The presence of intermittent left temporal theta slowing can indicate focal neuronal dysfunction, but in this particular age population can also be a normal phenomenon.    PHYSICAL EXAM Pleasant   elderly African American lady currently not in distress.Awake alert. Afebrile. Head is nontraumatic. Neck is supple without bruit. Hearing is normal. Cardiac exam no murmur or gallop. Lungs are clear to auscultation. Distal pulses are well felt. Neurological Exam :  Awake  Alert oriented x 3. Normal speech and language.eye movements full without nystagmus.fundi were not visualized. Vision acuity and fields appear normal. Hearing is normal. Palatal movements are normal. Face symmetric. Tongue midline. Normal strength, tone, reflexes and coordination. Normal sensation. Gait deferred. ASSESSMENT/PLAN  Ms.  Morgan Hubbard is a 79 y.o.  female presenting with  Recurrent transient episodes of expressive aphasia,( 3 in 10 days) who did not receive IV t-PA due to delay in arrival. Seizure suspected. MRI revealed 2 deep L parietal white matter infarcts, not related to acute symptoms yesterday, but consistent with recurrent TIAs she has experienced over the past 2-3 weeks. Strokes appear to be approx 38 week old.   Stroke:  2 deep left white matter thrombotic infarcts secondary to small vessel disease   On aspirin 81 mg orally every day prior to admission  Change to clopidogrel 75 mg orally every day  CT    No acute finding.   MRI  Two deep white matter infarctions left parietal lobe   MRA not ordered  Echo  ordered   Carotid bruit on the right. Check carotid doppler  Lovenox 40 mg sq daily for VTE prophylaxis  Carb Control thin liquids  Up with assistance  no PT therapy needs  Risk factor management/education  Hypertension   Home medications resumed:  Coreg, apresoline, imdur  stable  Hyperlipidemia  LDL 150  Not on statin PTA  Add lipitor 20  LDL goal < 70 for diabetics  Diabetes, uncontrolled  HgbA1c 11.6  Goal < 7.0  educate patient about lifestyle changes for diabetes prevention  Other Stroke Risk Factors   Family hx stroke (mother)   UDS neg   ETOH neg  Other History  Hx Migraine  Hx vertigo  Hospital day # 1  SHARON BIBY, MSN, RN, ANVP-BC, ANP-BC, [facility] Pager: [phone] 01/14/2014 5:20 PM  I have personally examined this patient, reviewed notes, independently viewed imaging studies, participated in medical decision making and plan of care. I have made any additions or clarifications directly to the above note. Agree with note above. I think she has had a subacute tiny left periventricular white matter infarct  about one week ago with recurrent transient symptoms of speech disturbance. It is likely small vessel disease and  will date stroke risk stratification evaluation and aggressive risk factor control.  Antony Contras, MD Medical Director [place] Pager: [phone] 01/14/2014 5:24 PM   To contact Stroke Continuity provider, please refer to [email or web address] After hours, contact General Neurology

## 2014-01-14 NOTE — Progress Notes (Signed)
0100 pt sleeping lying on right side without obvious distress, sp02 89%. Administered 2lpm o2 Morgan Hubbard, Spo2 increased to 96%.

## 2014-01-14 NOTE — Progress Notes (Signed)
Utilization review completed. Keili Hasten, RN, BSN. 

## 2014-01-15 DIAGNOSIS — E7800 Pure hypercholesterolemia, unspecified: Secondary | ICD-10-CM

## 2014-01-15 DIAGNOSIS — I428 Other cardiomyopathies: Secondary | ICD-10-CM

## 2014-01-15 LAB — GLUCOSE, CAPILLARY
GLUCOSE-CAPILLARY: 178 mg/dL — AB (ref 70–99)
Glucose-Capillary: 350 mg/dL — ABNORMAL HIGH (ref 70–99)

## 2014-01-15 MED ORDER — CLOPIDOGREL BISULFATE 75 MG PO TABS: 75.0000 mg | ORAL_TABLET | Freq: Every day | ORAL | Status: DC

## 2014-01-15 MED ORDER — ATORVASTATIN CALCIUM 20 MG PO TABS: 20.0000 mg | ORAL_TABLET | Freq: Every day | ORAL | Status: DC

## 2014-01-15 MED ORDER — CIPROFLOXACIN HCL 500 MG PO TABS: 500.0000 mg | ORAL_TABLET | Freq: Two times a day (BID) | ORAL | Status: DC

## 2014-01-15 MED ORDER — DEXTROSE 5 % IV SOLN
1.0000 g | INTRAVENOUS | Status: DC
  Administered 2014-01-15: 1 g via INTRAVENOUS
  Filled 2014-01-15 (×2): qty 10

## 2014-01-15 NOTE — Consult Note (Signed)
Admit date: 01/13/2014 Referring Physician  Dr. Ree Kida Primary Physician [name], MD Primary Cardiologist  Dr. [name] Reason for Consultation:  decreased ejection fraction  HPI: 79 year old with cardiomyopathy, ejection fraction estimated 25-30% followed by Dr. Rockey Situ with echocardiogram today demonstrating an ejection fraction estimated at 15-20%.  She came in with a reported speech problem, 3 separate episodes over 10 days. Difficulty finding her words. She had an MRI of the brain showing small vessel infarctions.    PMH:   Past Medical History  Diagnosis Date  . GERD (gastroesophageal reflux disease)   . Ulcer   . Hyperlipidemia   . Colon polyps   . Thyroid disease   . Shingles   . Urine incontinence   . Goiter past remote    treated with RI  . Other primary cardiomyopathies   . Hyperpotassemia   . Inflammatory and toxic neuropathy, unspecified   . Reflex sympathetic dystrophy, unspecified   . Hypertonicity of bladder   . Hypertension   . Shortness of breath     ambulation  . Vertigo     hx of  . Overactive bladder   . LBBB (left bundle branch block)   . Cardiac LV ejection fraction >40%     "it was 43 last year" (01/13/2014)  . Type II diabetes mellitus   . Migraine     "stopped many years ago" (01/13/2014)  . Arthritis     "hands" (01/13/2014)  . Chronic lower back pain   . Basal cell carcinoma 01/2014    "bridge of nose"  . Expressive aphasia     "3 times in the last week" (01/13/2014)    PSH:   Past Surgical History  Procedure Laterality Date  . Finger surgery Left [place]    "knot on index"  . Kidney donation Left 1989  . Lumbar laminectomy/decompression microdiscectomy  1999  . Ct scan  3/12    outside hosp- lung nodule and gallstones  . Upper gastrointestinal endoscopy    . Colonoscopy  7/12    normal (hx of polyps in past)   . Back surgery    . Cataract extraction w/ intraocular lens implant Left ~ 2005    several eye  injections  . Lumbar laminectomy/decompression microdiscectomy  01/31/2012    Procedure: LUMBAR LAMINECTOMY/DECOMPRESSION MICRODISCECTOMY 2 LEVELS;  Surgeon: Eustace Moore, MD;  Location: [facility];  Service: Neurosurgery;  Laterality: N/A;  Thoracic twelve-lumbar one, lumbar one-two laminectomy   . Tubal ligation  1976  . Breast cyst excision Left 1959  . Posterior laminectomy / decompression cervical spine  1999  . Cardiac catheterization  "several"    [place]   Allergies:  Ace inhibitors; Codeine; Lisinopril; Nsaids; and Tylenol Prior to Admit Meds:   Prior to Admission medications   Medication Sig Start Date End Date Taking? Authorizing Provider  aspirin 81 MG tablet Take 81 mg by mouth daily.     Yes Historical Provider, MD  carvedilol (COREG) 12.5 MG tablet Take 1 tablet (12.5 mg total) by mouth 2 (two) times daily with a meal. 08/03/13  Yes Minna Merritts, MD  cholecalciferol (VITAMIN D) 1000 UNITS tablet Take 1,000 Units by mouth daily.   Yes Historical Provider, MD  gabapentin (NEURONTIN) 300 MG capsule Take 1 capsule (300 mg total) by mouth 3 (three) times daily. 12/16/12  Yes [place], MD  glimepiride (AMARYL) 2 MG tablet Take 1 tablet (2 mg total) by mouth 2 (two) times daily.  11/08/13  Yes Abner Greenspan, MD  hydrALAZINE (APRESOLINE) 25 MG tablet Take 2 tablets (50 mg total) by mouth 3 (three) times daily. 08/03/13  Yes Minna Merritts, MD  HYDROcodone-acetaminophen (NORCO/VICODIN) 5-325 MG per tablet Take 1 tablet by mouth every 6 (six) hours as needed. For pain 10/13/13  Yes Abner Greenspan, MD  isosorbide mononitrate (IMDUR) 30 MG 24 hr tablet Take 1 tablet (30 mg total) by mouth daily. 08/03/13  Yes Minna Merritts, MD  metFORMIN (GLUCOPHAGE-XR) 500 MG 24 hr tablet Take 1,000 mg by mouth 2 (two) times daily.   Yes Historical Provider, MD  MILK THISTLE PO Take 1 capsule by mouth daily.   Yes Historical Provider, MD  mirabegron ER ([name]) 50 MG TB24 Take by mouth daily.    Yes Historical Provider, MD  pantoprazole (PROTONIX) 40 MG tablet Take 1 tablet (40 mg total) by mouth daily. 11/08/13  Yes Abner Greenspan, MD  traMADol (ULTRAM) 50 MG tablet Take 1 tablet (50 mg total) by mouth daily as needed. 04/28/13  Yes Spencer Copland, MD  vitamin B-12 (CYANOCOBALAMIN) 1000 MCG tablet Take 1,000 mcg by mouth daily.   Yes Historical Provider, MD  atorvastatin (LIPITOR) 20 MG tablet Take 1 tablet (20 mg total) by mouth daily at 6 PM. 01/15/14   Maryann Mikhail, DO  atorvastatin (LIPITOR) 20 MG tablet Take 1 tablet (20 mg total) by mouth daily. 02/14/14   Maryann Mikhail, DO  ciprofloxacin (CIPRO) 500 MG tablet Take 1 tablet (500 mg total) by mouth 2 (two) times daily. 01/15/14   Maryann Mikhail, DO  clopidogrel (PLAVIX) 75 MG tablet Take 1 tablet (75 mg total) by mouth daily. 01/15/14   Maryann Mikhail, DO  clopidogrel (PLAVIX) 75 MG tablet Take 1 tablet (75 mg total) by mouth daily. 02/14/14   Maryann Mikhail, DO   Fam HX:    Family History  Problem Relation Age of Onset  . Arthritis Mother   . Cancer Mother     uterine and mouth  . Hyperlipidemia Mother   . Stroke Mother   . Hypertension Mother   . Alcohol abuse Father   . Diabetes Father   . Cancer Sister     breast  . Diabetes Sister   . Cancer Brother     lung cancer  . Kidney disease Brother   . Diabetes Brother   . Hyperlipidemia Sister   . Heart disease Sister   . Hypertension Sister   . Diabetes Sister   . Hyperlipidemia Brother   . Kidney failure Brother   . Diabetes Daughter    Social HX:    History   Social History  . Marital Status: Married    Spouse Name: N/A    Number of Children: 2  . Years of Education: N/A   Occupational History  . retired - Production manager     Social History Main Topics  . Smoking status: Never Smoker   . Smokeless tobacco: Never Used  . Alcohol Use: No  . Drug Use: No  . Sexual Activity: Yes   Other Topics Concern  . Not on file   Social History Narrative    Married 2nd time   Daughter is Surveyor, minerals      ROS:  All 11 ROS were addressed and are negative except what is stated in the HPI   Physical Exam: Blood pressure 137/53, pulse 65, temperature 97.9 F (36.6 C), temperature source Oral, resp. rate 20, height 5\' 2"  (1.575  m), weight 154 lb (69.854 kg), last menstrual period 06/03/1992, SpO2 97.00%.   General: Well developed, well nourished, in no acute distress Head: Eyes PERRLA, No xanthomas.   Normal cephalic and atramatic  Lungs:   Clear bilaterally to auscultation and percussion. Normal respiratory effort. No wheezes, no rales. Heart:   HRRR S1 S2 Pulses are 2+ & equal. No murmur, rubs, gallops.  No carotid bruit. No JVD.  No abdominal bruits.  Abdomen: Bowel sounds are positive, abdomen soft and non-tender without masses. No hepatosplenomegaly. Msk:  Back normal. Normal strength and tone for age. Extremities:  No clubbing, cyanosis or edema.  DP +1 Neuro: Alert and oriented X 3, non-focal, MAE x 4 GU: Deferred Rectal: Deferred Psych:  Good affect, responds appropriately      Labs: Lab Results  Component Value Date   WBC 9.0 01/14/2014   HGB 14.4 01/14/2014   HCT 43.5 01/14/2014   MCV 90.1 01/14/2014   PLT 207 01/14/2014     Recent Labs Lab 01/13/14 1052 01/14/14 0330  NA 142 143  K 5.0 4.8  CL 102 101  CO2 29 29  BUN 18 15  CREATININE 0.86 0.91  CALCIUM 9.1 8.9  PROT 6.7  --   BILITOT 0.4  --   ALKPHOS 87  --   ALT 13  --   AST 15  --   GLUCOSE 146* 295*   No results found for this basename: CKTOTAL, CKMB, TROPONINI,  in the last 72 hours Lab Results  Component Value Date   CHOL 250* 01/14/2014   HDL 44 01/14/2014   LDLCALC 150* 01/14/2014   TRIG 278* 01/14/2014   No results found for this basename: DDIMER     Radiology:  Mr Brain Wo Contrast  01/14/2014   CLINICAL DATA:  Acute onset of speech disturbance.  EXAM: MRI HEAD WITHOUT CONTRAST  TECHNIQUE: Multiplanar, multiecho pulse sequences of the brain and  surrounding structures were obtained without intravenous contrast.  COMPARISON:  Head CT 01/13/2014  FINDINGS: Diffusion imaging shows 2 tiny foci of restricted diffusion in the left parietal deep white matter. No evidence of cortical or large vessel infarction.  The brainstem and cerebellum are normal. The cerebral hemispheres otherwise show minimal small vessel change of the cerebral hemispheric white matter. No mass lesion, hemorrhage, hydrocephalus or extra-axial collection. No pituitary mass. No inflammatory sinus disease. No skull or skullbase lesion. Major vessels at the base of the brain show flow.  Incidental note is made of arthropathy of the C1-2 articulation with narrowing of the spinal canal.  IMPRESSION: Two acute deep white matter infarctions in the left parietal lobe consistent with small vessel infarctions.   Electronically Signed   By: Nelson Chimes M.D.   On: 01/14/2014 09:03   Personally viewed.  EKG:   01/13/14-sinus rhythm, left bundle branch block, no other abnormalities, no change from prior Personally viewed.   Marland Kitchen aspirin  325 mg Oral Daily  . atorvastatin  20 mg Oral q1800  . carvedilol  12.5 mg Oral BID WC  . cefTRIAXone (ROCEPHIN)  IV  1 g Intravenous Q24H  . enoxaparin (LOVENOX) injection  40 mg Subcutaneous Q24H  . gabapentin  300 mg Oral TID  . hydrALAZINE  50 mg Oral TID  . insulin aspart  0-9 Units Subcutaneous TID WC  . isosorbide mononitrate  30 mg Oral Daily  . mirabegron ER  50 mg Oral Daily  . pantoprazole  40 mg Oral QAC breakfast  . vitamin B-12  1,000 mcg Oral Daily    ASSESSMENT/PLAN:    79 year old with dilated cardiomyopathy with severely reduced ejection fraction of 15-20% down from 25-30% here with expressive aphasia consistent with stroke.  1. Dilated cardiomyopathy/nonischemic cardiomyopathy-she currently appears well compensated. Continue with carvedilol 12.5 mg twice a day as well as hydralazine and isosorbide. Her ejection fraction has worsened  however clinically, relatively unchanged. Conditioning has always been an issue. She will not take her Lasix at times secondary to overactive bladder. Cardiac catheterization 08/30/10 in [place] showed no significant CAD. She previously had an MRI at [facility] with reported ejection fraction of 46%.   She continues to exercise on treadmill. Excellent. Keep this up.  I personally viewed echocardiogram and ejection fraction does appear reduced possibly in the 25% range. It does not look significantly changed from prior echocardiogram but small fluctuations in ejection fraction can certainly be subjective and I explained this to her. Regardless, continue with current medication strategy. Daily weights, watch fluids, reduce salt.  I am comfortable with her being discharged. Continue followup with Dr. Rockey Situ.   Candee Furbish, MD  01/15/2014  3:36 PM

## 2014-01-15 NOTE — Discharge Summary (Signed)
Physician Discharge Summary  Morgan Hubbard [id number] DOB: 20-Mar-1935 DOA: 01/13/2014  PCP: [name], MD  Admit date: 01/13/2014 Discharge date: 01/15/2014  Time spent: 45 minutes  Recommendations for Outpatient Follow-up:  Patient will be discharged home. She will need to follow up with her primary care physician within one week of discharge. Patient should also follow up with her cardiologist within one week of discharge. She should follow up with neurologist, Dr. Erlinda Hong, within 4 weeks of discharge.  Patient to continue her medications as prescribed. Patient to continue normal physical activity as tolerated. Patient should follow a heart healthy/carb modified diet with 1500 mL fluid restriction per day.  Discharge Diagnoses:  Acute CVA/expressive aphasia Diabetes mellitus type 2 with neuropathy Essential hypertension Chronic systolic heart failure Urinary tract infection with oral overactive bladder  Discharge Condition: Stable  Diet recommendation: Heart healthy/carb modified, 1500 mL fluid restriction per day  [facility] Weights   01/13/14 0952  Weight: 69.854 kg (154 lb)    History of present illness:  on 01/13/2014  Morgan Hubbard is a 79 y.o. female with PMH of DM, HTN, Chronic systolic CHF EF [id number] presented with speech problems. She reported 3 episodes in the last 10days, last one being 01/12/14, during which she had acute onset of difficulty with expressing words and word finding difficulty. These episodes lasted for about couple of minutes and were followed by a period during which her speech was forced and different and then returned to normal. She spoke to one of her RN friends, who was concerned about a TIA and sent her to the ER for evaluation. At the time of admission, patient was asymptomatic. Patient was seen by neurology in the emergency department and ordered an EEG and MRI.  Hospital Course:  Acute CVA/Expressive Aphasia  -CT of the head showed no acute finding  -MRI  brain: Two acute deep white matter infarctions in the left parietal lobe consistent with small vessel infarctions.  -EEG: normal awake and asleep EEG. The presence of intermittent left temporal theta slowing can indicate focal neuronal dysfunction, but in this particular age population can also be a normal phenomenon.  -Echocardiogram shows EF of 15-20% -Carotid Doppler: Bilateral: 1-39% ICA stenosis. Vertebral artery flow is antegrade.  -Hemoglobin A1c 8.9, LDL 150 -Neurology consulted and appreciated  -Pending PT, OT, speech evaluations  -Continue aspirin 25 mg daily for secondary stroke prevention  -Patient started on atorvastatin  Diabetes mellitus, type 2 with neuropathy  -Medication, metformin and Amaryl currently held  -Was placed on sliding insulin scale with CBG monitoring  -Hemoglobin A1c 8.9 -Continue Neurontin for neuropathy  -May resume home regimen once discharged, patient strongly urged to follow up with her primary care physician regarding better control of her diabetes.  Essential Hypertension  -Currently controlled  -Continue Coreg, hydralazine, Imdur lower   Chronic systolic heart failure  -Appears compensated, no signs of overload -Echocardiogram October 2014 shows an EF of [id number], grade 1 diastolic dysfunction  -Repeat echocardiogram: EF of 15-20% -Spoke with patient regarding the findings, she stated that in October 2014 she had a cardiac MRI which showed an EF of 43% at [facility]. -Consulted cardiology, recommended patient continue Coreg and lasix, however, patient does not take lasix at times due to her overactive bladder.  -Continue daily weights, monitor intake and output   UTI with overactive bladder  -UA: WBC 3-6, positive nitrites, moderate leukocytes  -Urine culture: >100K Ecoli  -Patient has overactive bladder and is enrolled in PTNS and sees  a urologist  -Placed on ceftriaxone, will discharge patient with ciprofloxacin.  Procedures: Echocardiogram Study  Conclusions - Left ventricle: The cavity size was mildly dilated. Systolic function was severely reduced. The estimated ejection fraction was in the range of 15% to 20%. Wall motion was normal; there were no regional wall motion abnormalities. Cannot exclude thrombus. - Ventricular septum: Septal motion showed abnormal function, dyssynergy, and paradox. These changes are consistent with a left bundle branch block. - Aortic valve: There was trivial regurgitation.  Carotid Doppler: Bilateral: 1-39% ICA stenosis. Vertebral artery flow is antegrade.   Consultations: Neurology Cardiology  Discharge Exam: Filed Vitals:   01/15/14 1411  BP: 137/53  Pulse: 65  Temp: 97.9 F (36.6 C)  Resp: 20   Exam  General: Well developed, well nourished, NAD, appears stated age  HEENT: NCAT, mucous membranes moist.  Cardiovascular: S1 S2 auscultated, Regular rate and rhythm.  Respiratory: Clear to auscultation bilaterally with equal chest rise  Abdomen: Soft, nontender, nondistended, + bowel sounds  Extremities: warm dry without cyanosis clubbing or edema  Neuro: AAOx3, no focal deficits  Skin: Without rashes exudates or nodules  Psych: Normal affect and demeanor with intact judgement and insight   Discharge Instructions      Discharge Instructions   Discharge instructions    Complete by:  As directed   Patient will be discharged home. She will need to follow up with her primary care physician within one week of discharge. Patient should also follow up with her cardiologist within one week of discharge. She should follow up with neurologist, Dr. Erlinda Hong, within 4 weeks of discharge.  Patient to continue her medications as prescribed. Patient to continue normal physical activity as tolerated. Patient should follow a heart healthy/carb modified diet with 1500 mL fluid restriction per day.            Medication List    STOP taking these medications       aspirin 81 MG tablet      TAKE these  medications       atorvastatin 20 MG tablet  Commonly known as:  LIPITOR  Take 1 tablet (20 mg total) by mouth daily at 6 PM.     atorvastatin 20 MG tablet  Commonly known as:  LIPITOR  Take 1 tablet (20 mg total) by mouth daily.  Start taking on:  02/14/2014     carvedilol 12.5 MG tablet  Commonly known as:  COREG  Take 1 tablet (12.5 mg total) by mouth 2 (two) times daily with a meal.     cholecalciferol 1000 UNITS tablet  Commonly known as:  VITAMIN D  Take 1,000 Units by mouth daily.     ciprofloxacin 500 MG tablet  Commonly known as:  CIPRO  Take 1 tablet (500 mg total) by mouth 2 (two) times daily.     clopidogrel 75 MG tablet  Commonly known as:  PLAVIX  Take 1 tablet (75 mg total) by mouth daily.     clopidogrel 75 MG tablet  Commonly known as:  PLAVIX  Take 1 tablet (75 mg total) by mouth daily.  Start taking on:  02/14/2014     gabapentin 300 MG capsule  Commonly known as:  NEURONTIN  Take 1 capsule (300 mg total) by mouth 3 (three) times daily.     glimepiride 2 MG tablet  Commonly known as:  AMARYL  Take 1 tablet (2 mg total) by mouth 2 (two) times daily.     hydrALAZINE 25 MG  tablet  Commonly known as:  APRESOLINE  Take 2 tablets (50 mg total) by mouth 3 (three) times daily.     HYDROcodone-acetaminophen 5-325 MG per tablet  Commonly known as:  NORCO/VICODIN  Take 1 tablet by mouth every 6 (six) hours as needed. For pain     isosorbide mononitrate 30 MG 24 hr tablet  Commonly known as:  IMDUR  Take 1 tablet (30 mg total) by mouth daily.     metFORMIN 500 MG 24 hr tablet  Commonly known as:  GLUCOPHAGE-XR  Take 1,000 mg by mouth 2 (two) times daily.     MILK THISTLE PO  Take 1 capsule by mouth daily.     [name] 50 MG Tb24 tablet  Generic drug:  mirabegron ER  Take by mouth daily.     pantoprazole 40 MG tablet  Commonly known as:  PROTONIX  Take 1 tablet (40 mg total) by mouth daily.     traMADol 50 MG tablet  Commonly known as:  ULTRAM    Take 1 tablet (50 mg total) by mouth daily as needed.     vitamin B-12 1000 MCG tablet  Commonly known as:  CYANOCOBALAMIN  Take 1,000 mcg by mouth daily.       Allergies  Allergen Reactions  . Ace Inhibitors Swelling    REACTION: tongue swelling  . Codeine Other (See Comments)    REACTION: nausea  . Lisinopril Swelling    Swelling of tongue  . Nsaids   . Tylenol [Acetaminophen]       The results of significant diagnostics from this hospitalization (including imaging, microbiology, ancillary and laboratory) are listed below for reference.    Significant Diagnostic Studies: Ct Head Wo Contrast  01/13/2014   CLINICAL DATA:  Facial droop about the left eye.  EXAM: CT HEAD WITHOUT CONTRAST  TECHNIQUE: Contiguous axial images were obtained from the base of the skull through the vertex without intravenous contrast.  COMPARISON:  None.  FINDINGS: There is no evidence of acute intracranial abnormality including infarct, hemorrhage, mass lesion, mass effect, midline shift or abnormal extra-axial fluid collection. No hydrocephalus or pneumocephalus. Imaged paranasal sinuses and mastoid air cells are clear. Carotid atherosclerosis noted.  IMPRESSION: No acute finding.   Electronically Signed   By: Inge Rise M.D.   On: 01/13/2014 11:20   Mr Brain Wo Contrast  01/14/2014   CLINICAL DATA:  Acute onset of speech disturbance.  EXAM: MRI HEAD WITHOUT CONTRAST  TECHNIQUE: Multiplanar, multiecho pulse sequences of the brain and surrounding structures were obtained without intravenous contrast.  COMPARISON:  Head CT 01/13/2014  FINDINGS: Diffusion imaging shows 2 tiny foci of restricted diffusion in the left parietal deep white matter. No evidence of cortical or large vessel infarction.  The brainstem and cerebellum are normal. The cerebral hemispheres otherwise show minimal small vessel change of the cerebral hemispheric white matter. No mass lesion, hemorrhage, hydrocephalus or extra-axial  collection. No pituitary mass. No inflammatory sinus disease. No skull or skullbase lesion. Major vessels at the base of the brain show flow.  Incidental note is made of arthropathy of the C1-2 articulation with narrowing of the spinal canal.  IMPRESSION: Two acute deep white matter infarctions in the left parietal lobe consistent with small vessel infarctions.   Electronically Signed   By: Nelson Chimes M.D.   On: 01/14/2014 09:03    Microbiology: Recent Results (from the past 240 hour(s))  URINE CULTURE     Status: None   Collection Time  01/13/14 11:00 AM      Result Value Ref Range Status   Specimen Description URINE, CLEAN CATCH   Final   Special Requests ADDED AT [id number] ON [id number]   Final   Culture  Setup Time     Final   Value: 01/13/2014 17:38     Performed at [place]     Final   Value: >=100,000 COLONIES/ML     Performed at [facility]   Culture     Final   Value: ESCHERICHIA COLI     Performed at [facility]   Report Status PENDING   Incomplete     Labs: Basic Metabolic Panel:  Recent Labs Lab 01/13/14 1052 01/14/14 0330  NA 142 143  K 5.0 4.8  CL 102 101  CO2 29 29  GLUCOSE 146* 295*  BUN 18 15  CREATININE 0.86 0.91  CALCIUM 9.1 8.9   Liver Function Tests:  Recent Labs Lab 01/13/14 1052  AST 15  ALT 13  ALKPHOS 87  BILITOT 0.4  PROT 6.7  ALBUMIN 3.2*   No results found for this basename: LIPASE, AMYLASE,  in the last 168 hours No results found for this basename: AMMONIA,  in the last 168 hours CBC:  Recent Labs Lab 01/13/14 1052 01/14/14 0330  WBC 6.9 9.0  NEUTROABS 4.0  --   HGB 14.5 14.4  HCT 43.9 43.5  MCV 87.5 90.1  PLT 207 207   Cardiac Enzymes: No results found for this basename: CKTOTAL, CKMB, CKMBINDEX, TROPONINI,  in the last 168 hours BNP: BNP (last 3 results) No results found for this basename: PROBNP,  in the last 8760 hours CBG:  Recent Labs Lab 01/14/14 1711 01/14/14 2233  01/15/14 0631 01/15/14 1132  GLUCAP 229* 252* 178* 350*       Signed:  Corian Handley  [facility] 01/15/2014, 4:20 PM

## 2014-01-15 NOTE — Progress Notes (Signed)
Pt d/c to home by car with family. Assessment stable. Pt verbalizes understanding of d/c instructions. 

## 2014-01-15 NOTE — Progress Notes (Signed)
Occupational Therapy Evaluation Patient Details Name: Morgan Hubbard MRN: [id number] DOB: [birth year] Today's Date: 01/15/2014    History of Present Illness 79 y.o. female admitted to [facility] on 01/13/14 with difficulty talking.  Stroke workup initiated and MRI revealed, "Two acute deep white matter infarctions in the left parietal lobe consistent with small vessel infarctions."  Pt with significant PMhx of HTN, vertigo, bladder incontinence, LBBB, DM, and multiple lumbar surgeries.    Clinical Impression   PTA pt lived at home and was independent with ADLs with use of rollator for long distance ambulation. Pt with history of back pain and mild unsteadiness, however is back to baseline. Pt overall mod independent for ADLs. Vision screened and found to be WNL. No further acute OT needs.     Follow Up Recommendations  No OT follow up    Equipment Recommendations  None recommended by OT    Recommendations for Other Services Other (comment) (follow up with eye doctor as planned for full assessment)     Precautions / Restrictions Precautions Precautions: Fall Precaution Comments: only mildly unsteady on her feet.  This is her baseline.       Mobility Bed Mobility Overal bed mobility: Modified Independent                Transfers Overall transfer level: Modified independent               General transfer comment: uses her hands for transitions         ADL Overall ADL's : Modified independent;At baseline                                       General ADL Comments: Educated pt on increased safety during ADLs through fall prevention and energy conservation. Recommended that pt sit for LB dressing and bathing.      Vision  Pt reports no change from baseline.               Additional Comments: Vision screen performed and all WNL. Pt with slight nystagmus in L eye and reports hx of trauma to Lt eye. Pt plans to follow up with new eye doctor this month  and will have full assessment.    Perception Perception Perception Tested?: No   Praxis Praxis Praxis tested?: Within functional limits    Pertinent Vitals/Pain Pain Assessment: No/denies pain        Extremity/Trunk Assessment Upper Extremity Assessment Upper Extremity Assessment: Overall WFL for tasks assessed   Lower Extremity Assessment Lower Extremity Assessment: Overall WFL for tasks assessed   Cervical / Trunk Assessment Cervical / Trunk Assessment: Other exceptions Cervical / Trunk Exceptions: h/o low back surgeries, per pt report her left is her "bad" leg related to her back pain.  Mildly antialgic gait pattern favoring her left leg.    Communication Communication Communication: No difficulties   Cognition Arousal/Alertness: Awake/alert Behavior During Therapy: WFL for tasks assessed/performed Overall Cognitive Status: Within Functional Limits for tasks assessed                                Home Living Family/patient expects to be discharged to:: Private residence Living Arrangements: Spouse/significant other Available Help at Discharge: Family  Lives With: Spouse    Prior Functioning/Environment Level of Independence: Independent;Independent with assistive device(s)        Comments: uses Rollator when out in the community more due to SOB at baseline than due to back, but she reports it is a combo of both.                               End of Session  Activity Tolerance: Patient tolerated treatment well Patient left: in bed;with call bell/phone within reach;with family/visitor present   Time: [id number] OT Time Calculation (min): 10 min Charges:  OT General Charges $OT Visit: 1 Procedure OT Evaluation $Initial OT Evaluation Tier I: 1 Procedure  Juluis Rainier [id number] 01/15/2014, 11:01 AM

## 2014-01-16 LAB — URINE CULTURE

## 2014-01-17 ENCOUNTER — Other Ambulatory Visit: Payer: Self-pay | Admitting: *Deleted

## 2014-01-17 MED ORDER — GLIMEPIRIDE 2 MG PO TABS
2.0000 mg | ORAL_TABLET | Freq: Two times a day (BID) | ORAL | Status: DC
Start: 2014-01-17 — End: 2014-10-27

## 2014-01-17 MED ORDER — GABAPENTIN 300 MG PO CAPS: 300.0000 mg | ORAL_CAPSULE | Freq: Three times a day (TID) | ORAL | Status: DC

## 2014-01-17 MED ORDER — PANTOPRAZOLE SODIUM 40 MG PO TBEC: 40.0000 mg | DELAYED_RELEASE_TABLET | Freq: Every day | ORAL | Status: DC

## 2014-01-18 ENCOUNTER — Ambulatory Visit (INDEPENDENT_AMBULATORY_CARE_PROVIDER_SITE_OTHER): Payer: Medicare Other | Admitting: Family Medicine

## 2014-01-18 ENCOUNTER — Encounter: Payer: Self-pay | Admitting: Family Medicine

## 2014-01-18 VITALS — BP 136/60 | HR 75 | Temp 98.0°F | Ht 62.0 in | Wt 151.2 lb

## 2014-01-18 DIAGNOSIS — I639 Cerebral infarction, unspecified: Secondary | ICD-10-CM

## 2014-01-18 DIAGNOSIS — I428 Other cardiomyopathies: Secondary | ICD-10-CM

## 2014-01-18 DIAGNOSIS — I6350 Cerebral infarction due to unspecified occlusion or stenosis of unspecified cerebral artery: Secondary | ICD-10-CM

## 2014-01-18 DIAGNOSIS — Z8673 Personal history of transient ischemic attack (TIA), and cerebral infarction without residual deficits: Secondary | ICD-10-CM | POA: Insufficient documentation

## 2014-01-18 DIAGNOSIS — N390 Urinary tract infection, site not specified: Secondary | ICD-10-CM

## 2014-01-18 DIAGNOSIS — I10 Essential (primary) hypertension: Secondary | ICD-10-CM

## 2014-01-18 DIAGNOSIS — Z8349 Family history of other endocrine, nutritional and metabolic diseases: Secondary | ICD-10-CM

## 2014-01-18 DIAGNOSIS — E7800 Pure hypercholesterolemia, unspecified: Secondary | ICD-10-CM

## 2014-01-18 DIAGNOSIS — E119 Type 2 diabetes mellitus without complications: Secondary | ICD-10-CM

## 2014-01-18 NOTE — Patient Instructions (Addendum)
See cardiology as planned  If any stroke symptoms call 911 Stay active  Continue plavix  Continue the atorvastatin and eat a healthy low cholesterol diet (Avoid red meat/ fried foods/ egg yolks/ fatty breakfast meats/ butter, cheese and high fat dairy/ and shellfish ) Schedule fasting labs in 6 weeks - for cholesterol and liver function and also for iron (given your family history) Follow up with your endocrinologist please since A1C was up to 8.9 in the hospital  Finish your cipro for the uti  Call us back about the neurology referral when you decide where you want to go  I'm glad you are feeling better

## 2014-01-18 NOTE — Progress Notes (Signed)
Subjective:    Patient ID: Morgan Hubbard, female    DOB: 1934-11-09, 79 y.o.   MRN: [id number]  HPI Here s/p hosp 8/13 to 8/15 Went in for trouble speaking and stroke symptoms -had had 3 events before -not symptomatic when she went to the hosp  cardiol f/u at [facility] Sept 4th   CT neg  MRI did show 2 acute small vessel ischemic strokes in L parietal lobe  EEG ok  Echo- EF was 15-20 % (she does not feel like it) Carotid doppler 1-39% stenosis bilaterally  A1C was 8.9 It was 7.9 the last time it was checked by endocrinologist-and then ate lots of peaches that she grew  Her next visit with endocrinology is in Dec  She is eating better now - and glucose was 130 this am   (was a lot higher in the hospital) She is back on regular medicine now   LDL chol was 150- she was put on atorvastatin  No side effects - doing well after a couple of days   Per pt was told to stay away from asa   BP Readings from Last 3 Encounters:  01/18/14 136/60  01/15/14 137/53  12/23/13 130/60   overall stable   Also had e coli uti -given ceftriaxone and then sent home with cipro    Chemistry      Component Value Date/Time   NA 143 01/14/2014 0330   K 4.8 01/14/2014 0330   CL 101 01/14/2014 0330   CO2 29 01/14/2014 0330   BUN 15 01/14/2014 0330   CREATININE 0.91 01/14/2014 0330      Component Value Date/Time   CALCIUM 8.9 01/14/2014 0330   ALKPHOS 87 01/13/2014 1052   AST 15 01/13/2014 1052   ALT 13 01/13/2014 1052   BILITOT 0.4 01/13/2014 1052       In PT and OT   On plavix   Is still having a little slowing of speech -nothing else Feeling back to normal  Did PT /OT / speech therapy  Is now close to baseline   Patient Active Problem List   Diagnosis Date Noted  . Stroke, small vessel 01/18/2014  . Family history of hemochromatosis 01/18/2014  . Cerebral thrombosis with cerebral infarction 01/14/2014  . Expressive aphasia 01/13/2014  . Nonischemic cardiomyopathy 04/05/2013  . Shortness of  breath 09/03/2012  . Gallstones 05/26/2012  . Elevated transaminase level 05/18/2012  . Spinal stenosis of lumbar region 12/10/2011  . Mixed incontinence urge and stress 12/10/2011  . Renal insufficiency 11/04/2011  . Goiter 01/14/2011  . Tongue swelling 12/01/2010  . Fatty liver 08/28/2010  . DM 07/09/2010  . HYPERCHOLESTEROLEMIA 07/09/2010  . HYPERKALEMIA 07/09/2010  . REFLEX SYMPATHETIC DYSTROPHY 07/09/2010  . POLYNEUROPATHY 07/09/2010  . HYPERTENSION 07/09/2010  . CARDIOMYOPATHY 07/09/2010  . OVERACTIVE BLADDER 07/09/2010   Past Medical History  Diagnosis Date  . GERD (gastroesophageal reflux disease)   . Ulcer   . Hyperlipidemia   . Colon polyps   . Thyroid disease   . Shingles   . Urine incontinence   . Goiter past remote    treated with RI  . Other primary cardiomyopathies   . Hyperpotassemia   . Inflammatory and toxic neuropathy, unspecified   . Reflex sympathetic dystrophy, unspecified   . Hypertonicity of bladder   . Hypertension   . Shortness of breath     ambulation  . Vertigo     hx of  . Overactive bladder   . LBBB (  left bundle branch block)   . Cardiac LV ejection fraction >40%     "it was 43 last year" (01/13/2014)  . Type II diabetes mellitus   . Migraine     "stopped many years ago" (01/13/2014)  . Arthritis     "hands" (01/13/2014)  . Chronic lower back pain   . Basal cell carcinoma 01/2014    "bridge of nose"  . Expressive aphasia     "3 times in the last week" (01/13/2014)   Past Surgical History  Procedure Laterality Date  . Finger surgery Left [place]    "knot on index"  . Kidney donation Left 1989  . Lumbar laminectomy/decompression microdiscectomy  1999  . Ct scan  3/12    outside hosp- lung nodule and gallstones  . Upper gastrointestinal endoscopy    . Colonoscopy  7/12    normal (hx of polyps in past)   . Back surgery    . Cataract extraction w/ intraocular lens implant Left ~ 2005    several eye injections  . Lumbar  laminectomy/decompression microdiscectomy  01/31/2012    Procedure: LUMBAR LAMINECTOMY/DECOMPRESSION MICRODISCECTOMY 2 LEVELS;  Surgeon: Eustace Moore, MD;  Location: [place] [facility];  Service: Neurosurgery;  Laterality: N/A;  Thoracic twelve-lumbar one, lumbar one-two laminectomy   . Tubal ligation  1976  . Breast cyst excision Left 1959  . Posterior laminectomy / decompression cervical spine  1999  . Cardiac catheterization  "several"    [place]   History  Substance Use Topics  . Smoking status: Never Smoker   . Smokeless tobacco: Never Used  . Alcohol Use: No   Family History  Problem Relation Age of Onset  . Arthritis Mother   . Cancer Mother     uterine and mouth  . Hyperlipidemia Mother   . Stroke Mother   . Hypertension Mother   . Alcohol abuse Father   . Diabetes Father   . Cancer Sister     breast  . Diabetes Sister   . Cancer Brother     lung cancer  . Kidney disease Brother   . Diabetes Brother   . Hyperlipidemia Sister   . Heart disease Sister   . Hypertension Sister   . Diabetes Sister   . Hyperlipidemia Brother   . Kidney failure Brother   . Diabetes Daughter    Allergies  Allergen Reactions  . Ace Inhibitors Swelling    REACTION: tongue swelling  . Codeine Other (See Comments)    REACTION: nausea  . Lisinopril Swelling    Swelling of tongue  . Nsaids   . Tylenol [Acetaminophen]    Current Outpatient Prescriptions on File Prior to Visit  Medication Sig Dispense Refill  . [START ON 02/14/2014] atorvastatin (LIPITOR) 20 MG tablet Take 1 tablet (20 mg total) by mouth daily.  90 tablet  0  . carvedilol (COREG) 12.5 MG tablet Take 1 tablet (12.5 mg total) by mouth 2 (two) times daily with a meal.  180 tablet  4  . cholecalciferol (VITAMIN D) 1000 UNITS tablet Take 1,000 Units by mouth daily.      . ciprofloxacin (CIPRO) 500 MG tablet Take 1 tablet (500 mg total) by mouth 2 (two) times daily.  8 tablet  0  . [START ON 02/14/2014] clopidogrel (PLAVIX) 75 MG  tablet Take 1 tablet (75 mg total) by mouth daily.  90 tablet  0  . gabapentin (NEURONTIN) 300 MG capsule Take 1 capsule (300 mg total) by mouth 3 (  three) times daily.  270 capsule  1  . glimepiride (AMARYL) 2 MG tablet Take 1 tablet (2 mg total) by mouth 2 (two) times daily.  180 tablet  1  . hydrALAZINE (APRESOLINE) 25 MG tablet Take 2 tablets (50 mg total) by mouth 3 (three) times daily.  180 tablet  6  . HYDROcodone-acetaminophen (NORCO/VICODIN) 5-325 MG per tablet Take 1 tablet by mouth every 6 (six) hours as needed. For pain  30 tablet  0  . isosorbide mononitrate (IMDUR) 30 MG 24 hr tablet Take 1 tablet (30 mg total) by mouth daily.  90 tablet  4  . metFORMIN (GLUCOPHAGE-XR) 500 MG 24 hr tablet Take 1,000 mg by mouth 2 (two) times daily.      Marland Kitchen MILK THISTLE PO Take 1 capsule by mouth daily.      . mirabegron ER ([name]) 50 MG TB24 Take by mouth daily.      . pantoprazole (PROTONIX) 40 MG tablet Take 1 tablet (40 mg total) by mouth daily.  90 tablet  1  . traMADol (ULTRAM) 50 MG tablet Take 1 tablet (50 mg total) by mouth daily as needed.  40 tablet  2  . vitamin B-12 (CYANOCOBALAMIN) 1000 MCG tablet Take 1,000 mcg by mouth daily.       No current facility-administered medications on file prior to visit.    Review of Systems Review of Systems  Constitutional: Negative for fever, appetite change, fatigue and unexpected weight change. pos for good energy level Eyes: Negative for pain and visual disturbance.  Respiratory: Negative for cough and shortness of breath.   Cardiovascular: Negative for cp or palpitations   neg for pedal edema or sob on exertion currently Gastrointestinal: Negative for nausea, diarrhea and constipation.  Genitourinary: Negative for urgency and frequency.  Skin: Negative for pallor or rash   Neurological: Negative for weakness, light-headedness, numbness and headaches.  Hematological: Negative for adenopathy. Does not bruise/bleed easily.    Psychiatric/Behavioral: Negative for dysphoric mood. The patient is not nervous/anxious.         Objective:   Physical Exam  Constitutional: She appears well-developed and well-nourished. No distress.  HENT:  Head: Normocephalic and atraumatic.  Right Ear: External ear normal.  Left Ear: External ear normal.  Nose: Nose normal.  Mouth/Throat: Oropharynx is clear and moist.  Eyes: Conjunctivae and EOM are normal. Pupils are equal, round, and reactive to light. Right eye exhibits no discharge. Left eye exhibits no discharge. No scleral icterus.  Neck: Normal range of motion. Neck supple. No JVD present. No thyromegaly present.  Cardiovascular: Normal rate, regular rhythm and intact distal pulses.  Exam reveals no gallop.   Murmur heard. Pulmonary/Chest: Effort normal and breath sounds normal. No respiratory distress. She has no wheezes. She has no rales.  Abdominal: Soft. Bowel sounds are normal. She exhibits no distension and no mass. There is no tenderness.  Musculoskeletal: She exhibits no edema and no tenderness.  Lymphadenopathy:    She has no cervical adenopathy.  Neurological: She is alert. She has normal strength and normal reflexes. She displays no atrophy and no tremor. No cranial nerve deficit or sensory deficit. She exhibits normal muscle tone. Coordination and gait normal.  No cerebellar signs   Skin: Skin is warm and dry. No rash noted. No erythema. No pallor.  Psychiatric: She has a normal mood and affect.          Assessment & Plan:   Problem List Items Addressed This Visit  Cardiovascular and Mediastinum   HYPERTENSION - Primary      bp in fair control at this time  BP Readings from Last 1 Encounters:  01/18/14 136/60   No changes needed Disc lifstyle change with low sodium diet and exercise   No change in medicines     CARDIOMYOPATHY     For f/u with cardiology soon  Low EF but pt feels good and doubts these numbers  No fluid retention     Stroke, small vessel     Pt states she is back to baseline after PT and OT  She is deciding which neurology practice to f/u with and will call with ref  On plavix Will call 911 if any stroke symptoms return  Disc controlling risk factors- chol and bp      Endocrine   DM      Sees endocrinology Lab Results  Component Value Date   HGBA1C 8.9* 01/14/2014   this is up - she will f/u       Genitourinary   RESOLVED: UTI (lower urinary tract infection)     Other   HYPERCHOLESTEROLEMIA     On lipitor to control this  Will watch liver fxn tests  Disc goals for lipids and reasons to control them Rev labs with pt Rev low sat fat diet in detail  As well  Lab in 6 wk Rev poss side eff-will update if any problems     Relevant Orders      Lipid panel      Comprehensive metabolic panel   Family history of hemochromatosis     Pt has had nl cbc  Multiple fam members with high iron  She desires ferritin to be checked with next labs     Relevant Orders      Ferritin

## 2014-01-18 NOTE — Progress Notes (Signed)
Pre visit review using our clinic review tool, if applicable. No additional management support is needed unless otherwise documented below in the visit note. 

## 2014-01-19 ENCOUNTER — Telehealth: Payer: Self-pay | Admitting: Family Medicine

## 2014-01-19 NOTE — Assessment & Plan Note (Signed)
bp in fair control at this time  BP Readings from Last 1 Encounters:  01/18/14 136/60   No changes needed Disc lifstyle change with low sodium diet and exercise   No change in medicines

## 2014-01-19 NOTE — Assessment & Plan Note (Signed)
Sees endocrinology Lab Results  Component Value Date   HGBA1C 8.9* 01/14/2014   this is up - she will f/u

## 2014-01-19 NOTE — Assessment & Plan Note (Signed)
Pt states she is back to baseline after PT and OT  She is deciding which neurology practice to f/u with and will call with ref  On plavix Will call 911 if any stroke symptoms return  Disc controlling risk factors- chol and bp

## 2014-01-19 NOTE — Assessment & Plan Note (Signed)
On lipitor to control this  Will watch liver fxn tests  Disc goals for lipids and reasons to control them Rev labs with pt Rev low sat fat diet in detail  As well  Lab in 6 wk Rev poss side eff-will update if any problems

## 2014-01-19 NOTE — Assessment & Plan Note (Signed)
For f/u with cardiology soon  Low EF but pt feels good and doubts these numbers  No fluid retention

## 2014-01-19 NOTE — Assessment & Plan Note (Signed)
Pt has had nl cbc  Multiple fam members with high iron  She desires ferritin to be checked with next labs

## 2014-01-19 NOTE — Telephone Encounter (Signed)
Relevant patient education assigned to patient using Emmi. ° °

## 2014-01-28 ENCOUNTER — Telehealth: Payer: Self-pay | Admitting: Family Medicine

## 2014-01-28 DIAGNOSIS — I6330 Cerebral infarction due to thrombosis of unspecified cerebral artery: Secondary | ICD-10-CM

## 2014-01-28 DIAGNOSIS — I639 Cerebral infarction, unspecified: Secondary | ICD-10-CM

## 2014-01-28 NOTE — Telephone Encounter (Signed)
I will refer

## 2014-01-28 NOTE — Telephone Encounter (Signed)
Pt called, needs referral to [facility].  Best number to call is [phone] or [phone] / lt

## 2014-01-29 IMAGING — CR DG SHOULDER 2+V*R*
4 series · 4 of 4 positions shown · non-contrast
Comparison: Chest radiographs 05/11/2012

CLINICAL DATA: Right shoulder pain after falling 4 days ago.

EXAM:
RIGHT SHOULDER - 2+ VIEW

[view not recorded (1 of 4)]
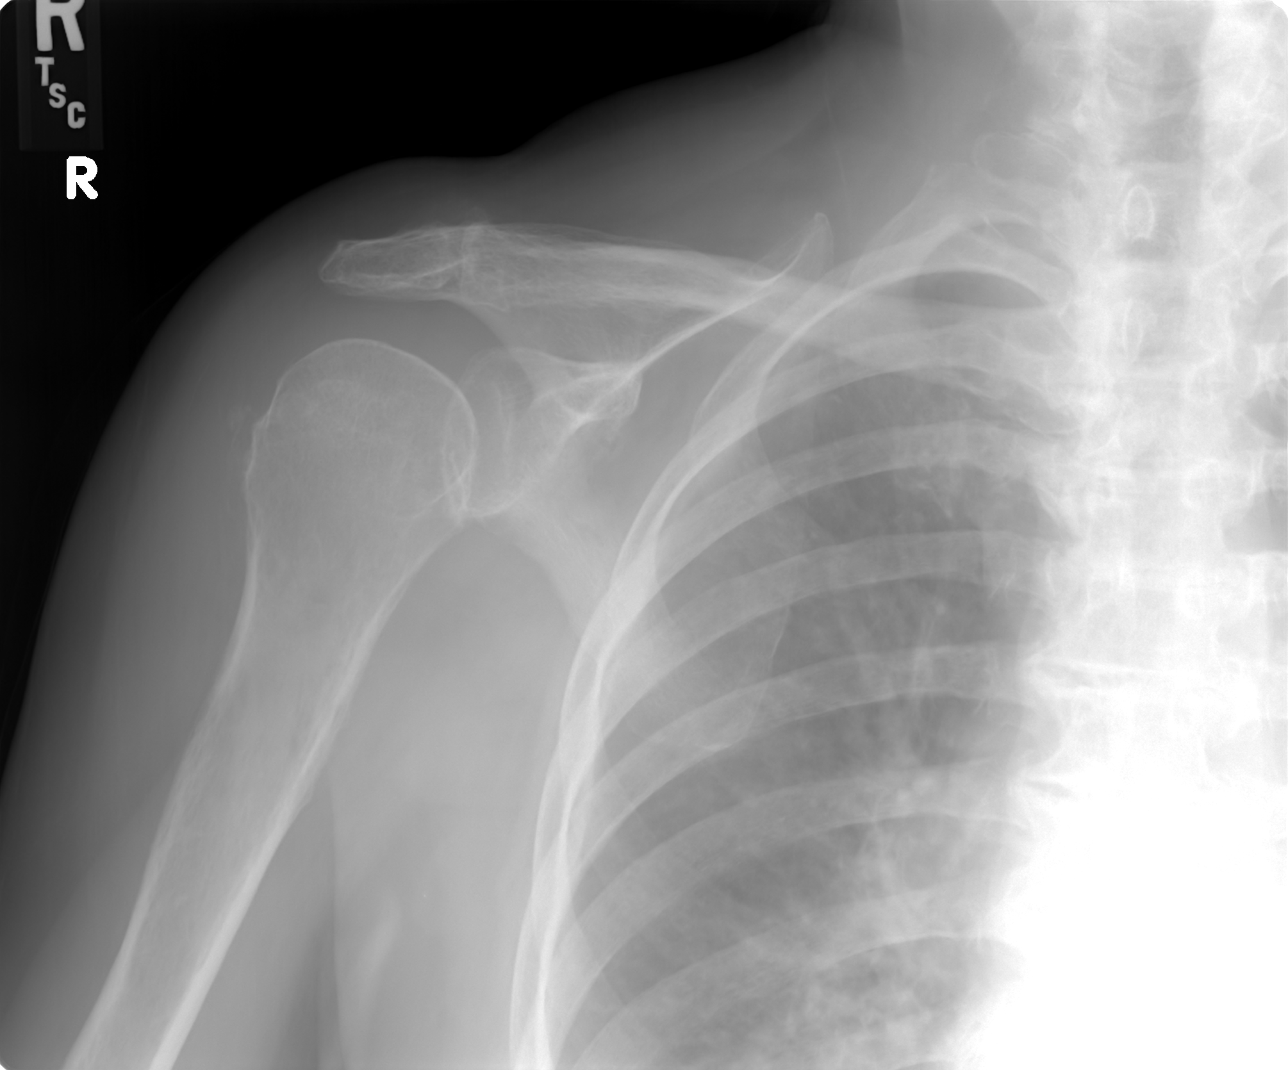

[view not recorded (2 of 4)]
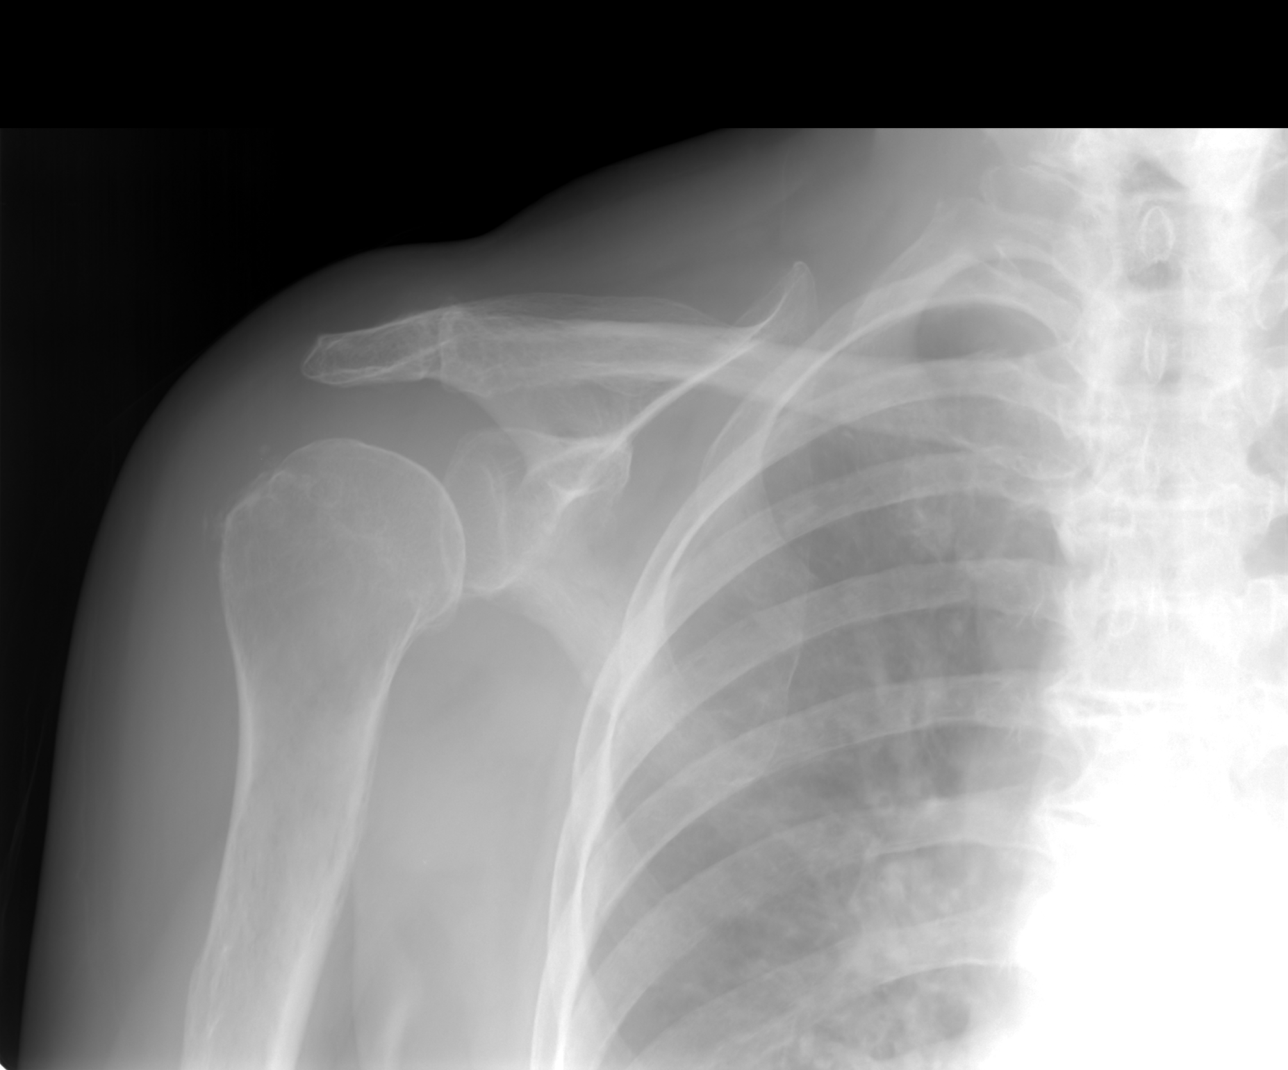

[view not recorded (3 of 4)]
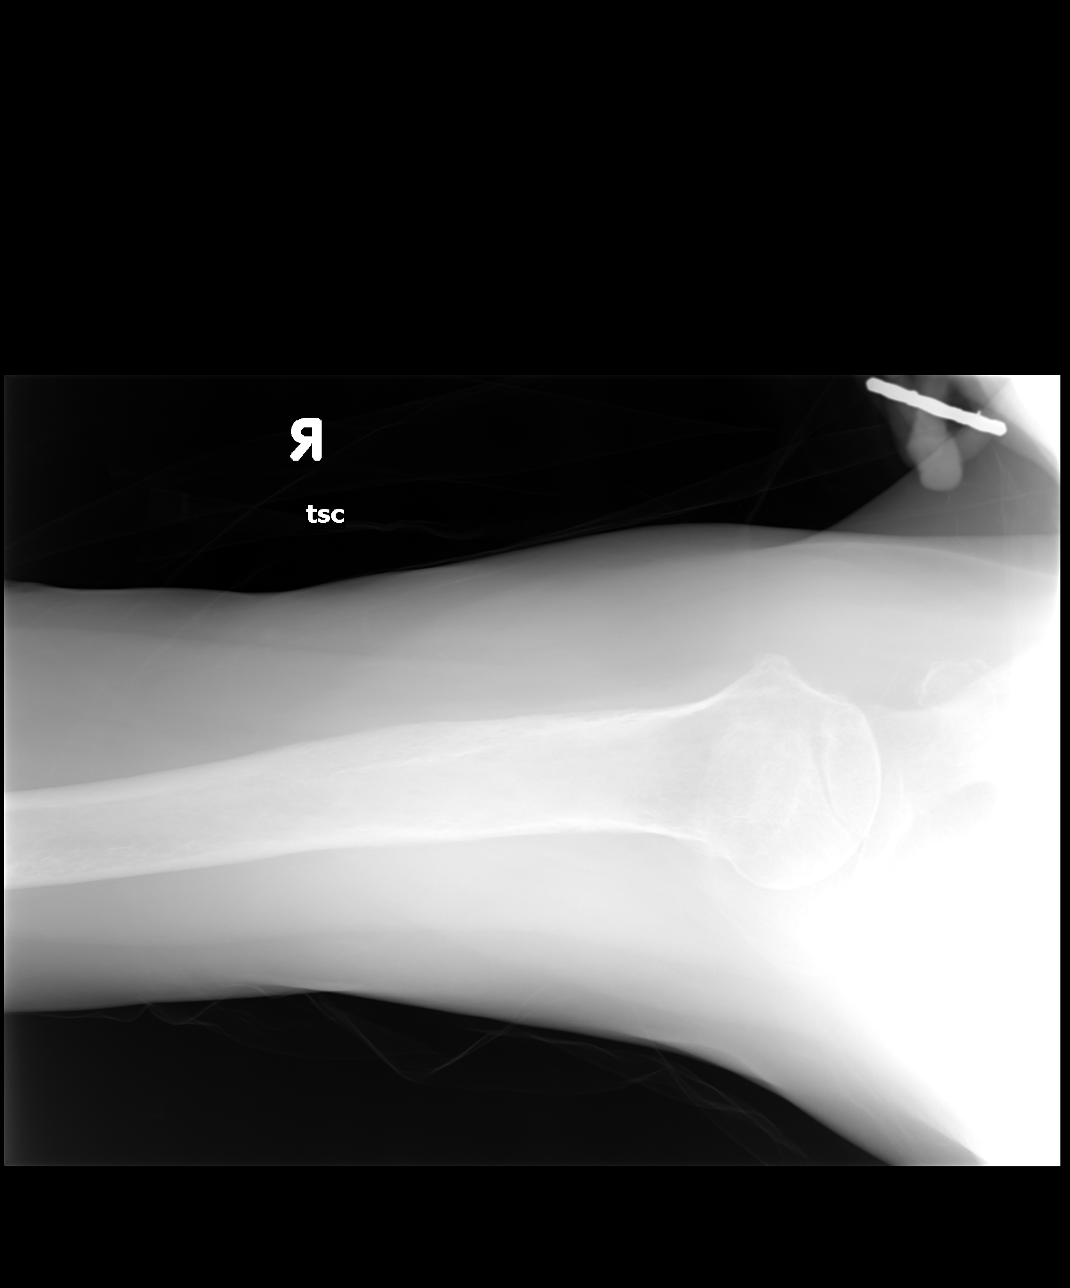

[view not recorded (4 of 4)]
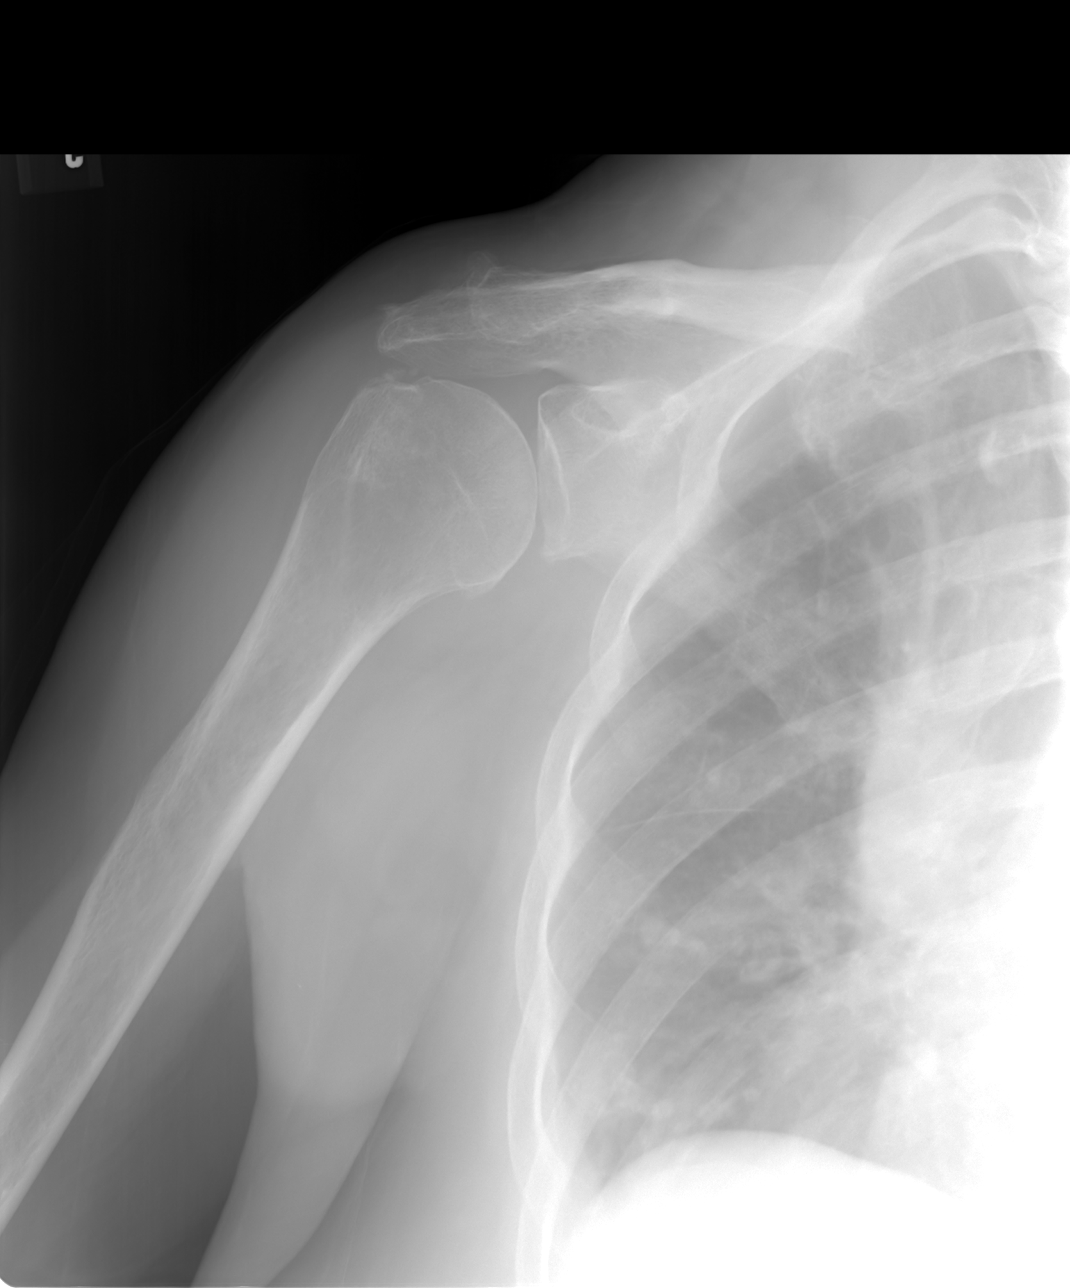

[4 of 4 positions shown; findings below may reference images not displayed]

FINDINGS: The mineralization and alignment are normal. There is no evidence of
acute fracture or dislocation. The subacromial space is preserved.
There are acromioclavicular and glenohumeral degenerative changes.
There are faint calcifications superolateral to the humeral head,
likely hydroxyapatite deposition within the distal rotator cuff. No
discrete loose bodies identified. Degenerative changes are noted
throughout the thoracic spine.
IMPRESSION: No acute osseous findings. Degenerative changes as described.
Suspected calcific rotator cuff tendinosis.

## 2014-02-23 ENCOUNTER — Other Ambulatory Visit: Payer: Self-pay | Admitting: Cardiovascular Disease

## 2014-02-25 ENCOUNTER — Telehealth: Payer: Self-pay | Admitting: *Deleted

## 2014-02-25 NOTE — Telephone Encounter (Signed)
Patient wants a 90 day supply.

## 2014-02-25 NOTE — Telephone Encounter (Signed)
No number to contact patient and there is no medication listed to refill. Please clarify what pt needs refilled.

## 2014-02-28 ENCOUNTER — Other Ambulatory Visit: Payer: Self-pay | Admitting: *Deleted

## 2014-02-28 MED ORDER — HYDRALAZINE HCL 50 MG PO TABS: 50.0000 mg | ORAL_TABLET | Freq: Three times a day (TID) | ORAL | Status: DC

## 2014-02-28 NOTE — Telephone Encounter (Signed)
Requested Prescriptions  ° °Signed Prescriptions Disp Refills  ° hydrALAZINE (APRESOLINE) 50 MG tablet 270 tablet 3  °  Sig: Take 1 tablet (50 mg total) by mouth 3 (three) times daily.  °  Authorizing Provider: GOLLAN, TIMOTHY J  °  Ordering User: LOPEZ, MARINA C  ° ° °

## 2014-02-28 NOTE — Telephone Encounter (Signed)
Refill Sent to [facility] for Hydralazine 50 mg TID #270 R#3.

## 2014-03-01 ENCOUNTER — Encounter: Payer: Self-pay | Admitting: Family Medicine

## 2014-03-01 ENCOUNTER — Ambulatory Visit (INDEPENDENT_AMBULATORY_CARE_PROVIDER_SITE_OTHER): Payer: Medicare Other | Admitting: Family Medicine

## 2014-03-01 ENCOUNTER — Other Ambulatory Visit (INDEPENDENT_AMBULATORY_CARE_PROVIDER_SITE_OTHER): Payer: Medicare Other

## 2014-03-01 VITALS — BP 128/82 | HR 90 | Temp 98.2°F | Ht 61.5 in | Wt 152.8 lb

## 2014-03-01 DIAGNOSIS — N3000 Acute cystitis without hematuria: Secondary | ICD-10-CM

## 2014-03-01 DIAGNOSIS — R300 Dysuria: Secondary | ICD-10-CM

## 2014-03-01 DIAGNOSIS — E7800 Pure hypercholesterolemia, unspecified: Secondary | ICD-10-CM

## 2014-03-01 DIAGNOSIS — N390 Urinary tract infection, site not specified: Secondary | ICD-10-CM | POA: Insufficient documentation

## 2014-03-01 DIAGNOSIS — Z8349 Family history of other endocrine, nutritional and metabolic diseases: Secondary | ICD-10-CM

## 2014-03-01 LAB — POCT UA - MICROSCOPIC ONLY

## 2014-03-01 LAB — POCT URINALYSIS DIPSTICK
Bilirubin, UA: NEGATIVE
Blood, UA: NEGATIVE
Glucose, UA: NEGATIVE
KETONES UA: NEGATIVE
Nitrite, UA: NEGATIVE
PH UA: 6
SPEC GRAV UA: 1.015
Urobilinogen, UA: 0.2

## 2014-03-01 MED ORDER — CIPROFLOXACIN HCL 250 MG PO TABS: 250.0000 mg | ORAL_TABLET | Freq: Two times a day (BID) | ORAL | Status: DC

## 2014-03-01 NOTE — Progress Notes (Signed)
Subjective:    Patient ID: Morgan Hubbard, female    DOB: 01/12/1935, 79 y.o.   MRN: [id number]  HPI Here with pain to urinate  Several days  No blood in urine  Her usual amount of frequency but some urgency No flank pain   ua - tr leuk and some protein   Has hx of renal insuff   Lab Results  Component Value Date   CREATININE 0.91 01/14/2014     Results for orders placed in visit on 03/01/14  POCT URINALYSIS DIPSTICK      Result Value Ref Range   Color, UA yellow     Clarity, UA hazy     Glucose, UA neg.     Bilirubin, UA neg.     Ketones, UA neg.     Spec Grav, UA 1.015     Blood, UA neg.     pH, UA 6.0     Protein, UA 100++     Urobilinogen, UA 0.2     Nitrite, UA neg.     Leukocytes, UA Trace      Patient Active Problem List   Diagnosis Date Noted  . UTI (urinary tract infection) 03/01/2014  . Stroke, small vessel 01/18/2014  . Family history of hemochromatosis 01/18/2014  . Cerebral thrombosis with cerebral infarction 01/14/2014  . Expressive aphasia 01/13/2014  . Nonischemic cardiomyopathy 04/05/2013  . Shortness of breath 09/03/2012  . Gallstones 05/26/2012  . Elevated transaminase level 05/18/2012  . Spinal stenosis of lumbar region 12/10/2011  . Mixed incontinence urge and stress 12/10/2011  . Renal insufficiency 11/04/2011  . Goiter 01/14/2011  . Tongue swelling 12/01/2010  . Fatty liver 08/28/2010  . DM 07/09/2010  . HYPERCHOLESTEROLEMIA 07/09/2010  . HYPERKALEMIA 07/09/2010  . REFLEX SYMPATHETIC DYSTROPHY 07/09/2010  . POLYNEUROPATHY 07/09/2010  . HYPERTENSION 07/09/2010  . CARDIOMYOPATHY 07/09/2010  . OVERACTIVE BLADDER 07/09/2010   Past Medical History  Diagnosis Date  . GERD (gastroesophageal reflux disease)   . Ulcer   . Hyperlipidemia   . Colon polyps   . Thyroid disease   . Shingles   . Urine incontinence   . Goiter past remote    treated with RI  . Other primary cardiomyopathies   . Hyperpotassemia   . Inflammatory and toxic  neuropathy, unspecified   . Reflex sympathetic dystrophy, unspecified   . Hypertonicity of bladder   . Hypertension   . Shortness of breath     ambulation  . Vertigo     hx of  . Overactive bladder   . LBBB (left bundle branch block)   . Cardiac LV ejection fraction >40%     "it was 43 last year" (01/13/2014)  . Type II diabetes mellitus   . Migraine     "stopped many years ago" (01/13/2014)  . Arthritis     "hands" (01/13/2014)  . Chronic lower back pain   . Basal cell carcinoma 01/2014    "bridge of nose"  . Expressive aphasia     "3 times in the last week" (01/13/2014)   Past Surgical History  Procedure Laterality Date  . Finger surgery Left [place]    "knot on index"  . Kidney donation Left 1989  . Lumbar laminectomy/decompression microdiscectomy  1999  . Ct scan  3/12    outside hosp- lung nodule and gallstones  . Upper gastrointestinal endoscopy    . Colonoscopy  7/12    normal (hx of polyps in past)   . Back  surgery    . Cataract extraction w/ intraocular lens implant Left ~ 2005    several eye injections  . Lumbar laminectomy/decompression microdiscectomy  01/31/2012    Procedure: LUMBAR LAMINECTOMY/DECOMPRESSION MICRODISCECTOMY 2 LEVELS;  Surgeon: Eustace Moore, MD;  Location: [place] [facility];  Service: Neurosurgery;  Laterality: N/A;  Thoracic twelve-lumbar one, lumbar one-two laminectomy   . Tubal ligation  1976  . Breast cyst excision Left 1959  . Posterior laminectomy / decompression cervical spine  1999  . Cardiac catheterization  "several"    [place]   History  Substance Use Topics  . Smoking status: Never Smoker   . Smokeless tobacco: Never Used  . Alcohol Use: No   Family History  Problem Relation Age of Onset  . Arthritis Mother   . Cancer Mother     uterine and mouth  . Hyperlipidemia Mother   . Stroke Mother   . Hypertension Mother   . Alcohol abuse Father   . Diabetes Father   . Cancer Sister     breast  . Diabetes Sister   . Cancer  Brother     lung cancer  . Kidney disease Brother   . Diabetes Brother   . Hyperlipidemia Sister   . Heart disease Sister   . Hypertension Sister   . Diabetes Sister   . Hyperlipidemia Brother   . Kidney failure Brother   . Diabetes Daughter    Allergies  Allergen Reactions  . Ace Inhibitors Swelling    REACTION: tongue swelling  . Codeine Other (See Comments)    REACTION: nausea  . Lisinopril Swelling    Swelling of tongue  . Nsaids   . Tylenol [Acetaminophen]    Current Outpatient Prescriptions on File Prior to Visit  Medication Sig Dispense Refill  . carvedilol (COREG) 12.5 MG tablet Take 1 tablet (12.5 mg total) by mouth 2 (two) times daily with a meal.  180 tablet  4  . cholecalciferol (VITAMIN D) 1000 UNITS tablet Take 1,000 Units by mouth daily.      . clopidogrel (PLAVIX) 75 MG tablet Take 1 tablet (75 mg total) by mouth daily.  90 tablet  0  . gabapentin (NEURONTIN) 300 MG capsule Take 1 capsule (300 mg total) by mouth 3 (three) times daily.  270 capsule  1  . glimepiride (AMARYL) 2 MG tablet Take 1 tablet (2 mg total) by mouth 2 (two) times daily.  180 tablet  1  . hydrALAZINE (APRESOLINE) 50 MG tablet Take 1 tablet (50 mg total) by mouth 3 (three) times daily.  270 tablet  3  . HYDROcodone-acetaminophen (NORCO/VICODIN) 5-325 MG per tablet Take 1 tablet by mouth every 6 (six) hours as needed. For pain  30 tablet  0  . isosorbide mononitrate (IMDUR) 30 MG 24 hr tablet Take 1 tablet (30 mg total) by mouth daily.  90 tablet  4  . metFORMIN (GLUCOPHAGE-XR) 500 MG 24 hr tablet Take 1,000 mg by mouth 2 (two) times daily.      Marland Kitchen MILK THISTLE PO Take 1 capsule by mouth daily.      . mirabegron ER ([name]) 50 MG TB24 Take by mouth daily.      . pantoprazole (PROTONIX) 40 MG tablet Take 1 tablet (40 mg total) by mouth daily.  90 tablet  1  . traMADol (ULTRAM) 50 MG tablet Take 1 tablet (50 mg total) by mouth daily as needed.  40 tablet  2  . vitamin B-12 (CYANOCOBALAMIN) 1000  MCG tablet  Take 1,000 mcg by mouth daily.       No current facility-administered medications on file prior to visit.    Review of Systems Review of Systems  Constitutional: Negative for fever, appetite change, fatigue and unexpected weight change.  Eyes: Negative for pain and visual disturbance.  Respiratory: Negative for cough and shortness of breath.   Cardiovascular: Negative for cp or palpitations    Gastrointestinal: Negative for nausea, diarrhea and constipation.  Genitourinary:pos  for urgency and frequency. neg for flank pain or n/v  Skin: Negative for pallor or rash   Neurological: Negative for weakness, light-headedness, numbness and headaches.  Hematological: Negative for adenopathy. Does not bruise/bleed easily.  Psychiatric/Behavioral: Negative for dysphoric mood. The patient is not nervous/anxious.         Objective:   Physical Exam  Constitutional: She appears well-developed and well-nourished.  overwt and well app  HENT:  Head: Normocephalic and atraumatic.  Eyes: Conjunctivae and EOM are normal. Pupils are equal, round, and reactive to light. No scleral icterus.  Neck: Normal range of motion. Neck supple.  Cardiovascular: Normal rate, regular rhythm and normal heart sounds.   Pulmonary/Chest: Effort normal and breath sounds normal.  Abdominal: Soft. Bowel sounds are normal. She exhibits no distension and no mass. There is no tenderness. There is no rebound and no guarding.  No suprapubic tenderness or fullness  No cva tenderness   Musculoskeletal: She exhibits no edema.  Lymphadenopathy:    She has no cervical adenopathy.  Neurological: She is alert.  Skin: Skin is warm and dry. No rash noted.  Psychiatric: She has a normal mood and affect.          Assessment & Plan:   Problem List Items Addressed This Visit     Genitourinary   UTI (urinary tract infection)     In pt with incontinence and overactive bladder tx with low dose cipro (has taken before  with success)  ua borderline-some clumped wbc on micro cx urine pending     Relevant Orders      POCT UA - Microscopic Only (Completed)    Other Visit Diagnoses   Dysuria    -  Primary    Relevant Orders       POCT urinalysis dipstick (Completed)       Urine culture

## 2014-03-01 NOTE — Patient Instructions (Signed)
Drink lots of water  Take cipro as directed We will culture your urine and call with a result

## 2014-03-01 NOTE — Assessment & Plan Note (Signed)
In pt with incontinence and overactive bladder tx with low dose cipro (has taken before with success)  ua borderline-some clumped wbc on micro cx urine pending

## 2014-03-01 NOTE — Progress Notes (Signed)
Pre visit review using our clinic review tool, if applicable. No additional management support is needed unless otherwise documented below in the visit note. 

## 2014-03-02 ENCOUNTER — Telehealth: Payer: Self-pay | Admitting: Family Medicine

## 2014-03-02 ENCOUNTER — Other Ambulatory Visit (INDEPENDENT_AMBULATORY_CARE_PROVIDER_SITE_OTHER): Payer: Medicare Other

## 2014-03-02 LAB — LIPID PANEL
CHOLESTEROL: 130 mg/dL (ref 0–200)
HDL: 32.9 mg/dL — ABNORMAL LOW (ref >39.00)
LDL CALC: 57 mg/dL (ref 0–99)
NonHDL: 97.1
TRIGLYCERIDES: 199 mg/dL — AB (ref 0.0–149.0)
Total CHOL/HDL Ratio: 4
VLDL: 39.8 mg/dL (ref 0.0–40.0)

## 2014-03-02 LAB — COMPREHENSIVE METABOLIC PANEL
ALK PHOS: 61 U/L (ref 39–117)
ALT: 21 U/L (ref 0–35)
AST: 26 U/L (ref 0–37)
Albumin: 3.6 g/dL (ref 3.5–5.2)
BUN: 11 mg/dL (ref 6–23)
CO2: 32 mEq/L (ref 19–32)
CREATININE: 0.9 mg/dL (ref 0.4–1.2)
Calcium: 9 mg/dL (ref 8.4–10.5)
Chloride: 104 mEq/L (ref 96–112)
GFR: 62.88 mL/min (ref >60.00)
GLUCOSE: 133 mg/dL — AB (ref 70–99)
Potassium: 5 mEq/L (ref 3.5–5.1)
Sodium: 143 mEq/L (ref 135–145)
Total Bilirubin: 0.8 mg/dL (ref 0.2–1.2)
Total Protein: 6.8 g/dL (ref 6.0–8.3)

## 2014-03-02 LAB — FERRITIN: FERRITIN: 38.9 ng/mL (ref 10.0–291.0)

## 2014-03-02 NOTE — Telephone Encounter (Signed)
Pt stopped by after Lab appointment and would like to have her lab results mailed to her with notes.  Thank you.  Best number to call [phone]

## 2014-03-03 LAB — URINE CULTURE
Colony Count: NO GROWTH
Organism ID, Bacteria: NO GROWTH

## 2014-03-15 ENCOUNTER — Ambulatory Visit (INDEPENDENT_AMBULATORY_CARE_PROVIDER_SITE_OTHER): Payer: Medicare Other | Admitting: Neurology

## 2014-03-15 ENCOUNTER — Encounter: Payer: Self-pay | Admitting: Neurology

## 2014-03-15 VITALS — BP 130/74 | HR 73 | Ht 62.0 in | Wt 154.5 lb

## 2014-03-15 DIAGNOSIS — I639 Cerebral infarction, unspecified: Secondary | ICD-10-CM

## 2014-03-15 DIAGNOSIS — R479 Unspecified speech disturbances: Secondary | ICD-10-CM

## 2014-03-15 NOTE — Patient Instructions (Signed)
1.  Continue aspirin and Plavix. 2.  Continue Lipitor 3.  Diabetes control 4.  Monitor for any more episodes of the speech problems.  Call us and we can start a seizure medication.  If you have any new symptoms or the speech disturbance persists past a few minutes, then go to the ED 5.  Follow up in 3 months.

## 2014-03-15 NOTE — Progress Notes (Addendum)
NEUROLOGY CONSULTATION NOTE  RAINELLE LEONELLI MRN: [id number] DOB: 1935-03-18  Referring provider: Dr. [name] Primary care provider: Dr. [name]  Reason for consult:  Stroke  HISTORY OF PRESENT ILLNESS: Morgan Hubbard is a 79 year old right-handed woman with history of diabetes mellitus type II, diabetic neuropathy, multiple back surgeries with residual left leg weakness, neck surgery, LBBB, low EF (above 40%),  hypertension, and complex regional pain syndrome who presents following hospitalization for CVA.  Records and images personally reviewed.  She had 3 episodes of speech disturbance in August.  She described it as difficulty getting words out.  She denied word-finding difficulty.  They lasted a few minutes and resolved.  She presented to the ED on 01/13/14 and was admitted for further evaluation.  MRI of the brain did reveal two acute small infarcts in the left parietal lobe.  2D echo showed EF of 15-20%.  Carotid doppler showed 1-39% bilateral ICA stenosis.  Hgb A1c was 8.9 and LDL was 150.  EEG revealed intermittent left temporal slowing, which is of uncertain clinical significance in a person of her age.  It was recommended to switch ASA 81mg  to Plavix and she was started on atorvastatin 20mg .  She followed up with her cardiologist, Dr. Rockey Situ, who added the ASA back and increased the atorvastatin to 40mg .  Two weeks after discharge from the hospital, she had two more brief episodes of speech disturbance (on 01/28/14 and 02/02/14).  She has not had any further spells.  Repeat fasting lipid panel performed 03/01/14 showed an LDL of 57.  She has since been started on Plavix.  It should be noted that multiple echocardiograms performed at [facility] have indicated a systolic EF in the [id number].  However, multiple tests performed with her cardiologist at [facility] have revealed EF in the 42s.  [facility] have reviewed the tests performed at [facility] and have found that the EF has been in the 40s.  A cardiac MRI  performed at [facility] showed a systolic EF of [id number].  It has been believed that the tests have been misread in [facility].  She takes gabapentin for diabetic neuropathy and CRPS.  PAST MEDICAL HISTORY: Past Medical History  Diagnosis Date  . GERD (gastroesophageal reflux disease)   . Ulcer   . Hyperlipidemia   . Colon polyps   . Thyroid disease   . Shingles   . Urine incontinence   . Goiter past remote    treated with RI  . Other primary cardiomyopathies   . Hyperpotassemia   . Inflammatory and toxic neuropathy, unspecified   . Reflex sympathetic dystrophy, unspecified   . Hypertonicity of bladder   . Hypertension   . Shortness of breath     ambulation  . Vertigo     hx of  . Overactive bladder   . LBBB (left bundle branch block)   . Cardiac LV ejection fraction >40%     "it was 43 last year" (01/13/2014)  . Type II diabetes mellitus   . Migraine     "stopped many years ago" (01/13/2014)  . Arthritis     "hands" (01/13/2014)  . Chronic lower back pain   . Basal cell carcinoma 01/2014    "bridge of nose"  . Expressive aphasia     "3 times in the last week" (01/13/2014)    PAST SURGICAL HISTORY: Past Surgical History  Procedure Laterality Date  . Finger surgery Left [place]    "knot on index"  . Kidney donation  Left 1989  . Lumbar laminectomy/decompression microdiscectomy  1999  . Ct scan  3/12    outside hosp- lung nodule and gallstones  . Upper gastrointestinal endoscopy    . Colonoscopy  7/12    normal (hx of polyps in past)   . Back surgery    . Cataract extraction w/ intraocular lens implant Left ~ 2005    several eye injections  . Lumbar laminectomy/decompression microdiscectomy  01/31/2012    Procedure: LUMBAR LAMINECTOMY/DECOMPRESSION MICRODISCECTOMY 2 LEVELS;  Surgeon: Eustace Moore, MD;  Location: [place] [facility];  Service: Neurosurgery;  Laterality: N/A;  Thoracic twelve-lumbar one, lumbar one-two laminectomy   . Tubal ligation  1976  . Breast cyst excision Left  1959  . Posterior laminectomy / decompression cervical spine  1999  . Cardiac catheterization  "several"    [place]    MEDICATIONS: Current Outpatient Prescriptions on File Prior to Visit  Medication Sig Dispense Refill  . aspirin 81 MG tablet Take 81 mg by mouth daily.      Marland Kitchen atorvastatin (LIPITOR) 20 MG tablet Take 80 mg by mouth daily.      . carvedilol (COREG) 12.5 MG tablet Take 1 tablet (12.5 mg total) by mouth 2 (two) times daily with a meal.  180 tablet  4  . cholecalciferol (VITAMIN D) 1000 UNITS tablet Take 1,000 Units by mouth daily.      . ciprofloxacin (CIPRO) 250 MG tablet Take 1 tablet (250 mg total) by mouth 2 (two) times daily.  10 tablet  0  . clopidogrel (PLAVIX) 75 MG tablet Take 1 tablet (75 mg total) by mouth daily.  90 tablet  0  . gabapentin (NEURONTIN) 300 MG capsule Take 1 capsule (300 mg total) by mouth 3 (three) times daily.  270 capsule  1  . glimepiride (AMARYL) 2 MG tablet Take 1 tablet (2 mg total) by mouth 2 (two) times daily.  180 tablet  1  . hydrALAZINE (APRESOLINE) 50 MG tablet Take 1 tablet (50 mg total) by mouth 3 (three) times daily.  270 tablet  3  . HYDROcodone-acetaminophen (NORCO/VICODIN) 5-325 MG per tablet Take 1 tablet by mouth every 6 (six) hours as needed. For pain  30 tablet  0  . isosorbide mononitrate (IMDUR) 30 MG 24 hr tablet Take 1 tablet (30 mg total) by mouth daily.  90 tablet  4  . metFORMIN (GLUCOPHAGE-XR) 500 MG 24 hr tablet Take 1,000 mg by mouth 2 (two) times daily.      Marland Kitchen MILK THISTLE PO Take 1 capsule by mouth daily.      . mirabegron ER ([name]) 50 MG TB24 Take by mouth daily.      . pantoprazole (PROTONIX) 40 MG tablet Take 1 tablet (40 mg total) by mouth daily.  90 tablet  1  . traMADol (ULTRAM) 50 MG tablet Take 1 tablet (50 mg total) by mouth daily as needed.  40 tablet  2  . vitamin B-12 (CYANOCOBALAMIN) 1000 MCG tablet Take 1,000 mcg by mouth daily.       No current facility-administered medications on file prior  to visit.    ALLERGIES: Allergies  Allergen Reactions  . Ace Inhibitors Swelling    REACTION: tongue swelling  . Codeine Other (See Comments)    REACTION: nausea  . Lisinopril Swelling    Swelling of tongue  . Nsaids   . Tylenol [Acetaminophen]     FAMILY HISTORY: Family History  Problem Relation Age of Onset  . Arthritis Mother   .  Cancer Mother     uterine and mouth  . Hyperlipidemia Mother   . Stroke Mother   . Hypertension Mother   . Alcohol abuse Father   . Diabetes Father   . Cancer Sister     breast  . Diabetes Sister   . Cancer Brother     lung cancer  . Kidney disease Brother   . Diabetes Brother   . Hyperlipidemia Sister   . Heart disease Sister   . Hypertension Sister   . Diabetes Sister   . Hyperlipidemia Brother   . Kidney failure Brother   . Diabetes Daughter     SOCIAL HISTORY: History   Social History  . Marital Status: Married    Spouse Name: N/A    Number of Children: 2  . Years of Education: N/A   Occupational History  . retired - Production manager     Social History Main Topics  . Smoking status: Never Smoker   . Smokeless tobacco: Never Used  . Alcohol Use: No  . Drug Use: No  . Sexual Activity: Yes   Other Topics Concern  . Not on file   Social History Narrative   Married 2nd time   Daughter is Surveyor, minerals     REVIEW OF SYSTEMS: Constitutional: No fevers, chills, or sweats, no generalized fatigue, change in appetite Eyes: No visual changes, double vision, eye pain Ear, nose and throat: No hearing loss, ear pain, nasal congestion, sore throat Cardiovascular: No chest pain, palpitations Respiratory:  No shortness of breath at rest or with exertion, wheezes GastrointestinaI: No nausea, vomiting, diarrhea, abdominal pain, fecal incontinence Genitourinary:  No dysuria, urinary retention or frequency Musculoskeletal:  No neck pain, back pain Integumentary: No rash, pruritus, skin lesions Neurological: as  above Psychiatric: No depression, insomnia, anxiety Endocrine: No palpitations, fatigue, diaphoresis, mood swings, change in appetite, change in weight, increased thirst Hematologic/Lymphatic:  No anemia, purpura, petechiae. Allergic/Immunologic: no itchy/runny eyes, nasal congestion, recent allergic reactions, rashes  PHYSICAL EXAM: Filed Vitals:   03/15/14 1013  BP: 130/74  Pulse: 73   General: No acute distress Head:  Normocephalic/atraumatic Neck: supple, no paraspinal tenderness, full range of motion Back: No paraspinal tenderness Heart: regular rate and rhythm Lungs: Clear to auscultation bilaterally. Vascular: Right carotid bruit Neurological Exam: Mental status: alert and oriented to person, place, and time, recent and remote memory intact, fund of knowledge intact, attention and concentration intact, speech fluent and not dysarthric, language intact. Cranial nerves: CN I: not tested CN II: pupils equal, round and reactive to light, visual fields intact, fundi unremarkable, without vessel changes, exudates, hemorrhages or papilledema. CN III, IV, VI:  full range of motion, no nystagmus, no ptosis CN V: facial sensation intact CN VII: upper and lower face symmetric CN VIII: hearing intact CN IX, X: gag intact, uvula midline CN XI: sternocleidomastoid and trapezius muscles intact CN XII: tongue midline Bulk & Tone: normal, no fasciculations. Motor: 5/5 throughout Sensation: reduced vibration in the feet.  Mildly reduced pinprick in the feet. Deep Tendon Reflexes: 1+ in upper extremities, absent in lower extremities.  Toes downgoing. Finger to nose testing: no dysmetria Heel to shin: no dysmetria Gait: Walks with a limp.  Unable to walk in tandem. Romberg with sway.  IMPRESSION: 1.  Cerebrovascular accident, likely thrombosis secondary to small vessel disease. 2.  Recurrent episodic speech disturbance.  Consider simple partial seizures  PLAN: 1.  Continue aspirin and  Plavix. 2.  Continue Lipitor (LDL at goal of less  than 70) 3.  Diabetes control 4.  Monitor for any more episodes of the speech problems.  If they recur, would try low-dose Keppra 5.  Follow up in 3 months.  Thank you for allowing me to take part in the care of this patient.  Metta Clines, DO  CC: [name], MD

## 2014-04-06 ENCOUNTER — Telehealth: Payer: Self-pay | Admitting: Family Medicine

## 2014-04-06 ENCOUNTER — Encounter: Payer: Self-pay | Admitting: Family Medicine

## 2014-04-06 MED ORDER — CLOPIDOGREL BISULFATE 75 MG PO TABS: 75.0000 mg | ORAL_TABLET | Freq: Every day | ORAL | Status: DC

## 2014-04-06 NOTE — Telephone Encounter (Signed)
Needs plavix sent to [facility]

## 2014-04-21 ENCOUNTER — Encounter: Payer: Self-pay | Admitting: Family Medicine

## 2014-04-22 ENCOUNTER — Encounter: Payer: Self-pay | Admitting: Family Medicine

## 2014-04-22 ENCOUNTER — Telehealth: Payer: Self-pay | Admitting: Family Medicine

## 2014-04-22 MED ORDER — HYDROCODONE-ACETAMINOPHEN 5-325 MG PO TABS: 1.0000 | ORAL_TABLET | Freq: Four times a day (QID) | ORAL | Status: DC | PRN

## 2014-04-22 NOTE — Telephone Encounter (Signed)
Pt notified Rx ready for pickup 

## 2014-04-22 NOTE — Telephone Encounter (Signed)
Refill of pain med for pick up  Please let pt know it is ready

## 2014-05-02 ENCOUNTER — Other Ambulatory Visit: Payer: Self-pay

## 2014-05-02 MED ORDER — LORAZEPAM 1 MG PO TABS: ORAL_TABLET | ORAL | Status: DC

## 2014-05-02 MED ORDER — LORAZEPAM 0.5 MG PO TABS: ORAL_TABLET | ORAL | Status: DC

## 2014-05-02 NOTE — Telephone Encounter (Signed)
Pt walked in requesting rx for lorazepam; pt has MRA scheduled at [facility] today at 1 pm and pt cannot get into MRI machine without med for anxiety. Pt would like to pick up rx while waiting; pt is in lobby.Please advise.

## 2014-05-02 NOTE — Telephone Encounter (Signed)
Pt given Rx

## 2014-05-12 ENCOUNTER — Encounter: Payer: Self-pay | Admitting: Family Medicine

## 2014-05-31 ENCOUNTER — Ambulatory Visit (INDEPENDENT_AMBULATORY_CARE_PROVIDER_SITE_OTHER): Payer: Medicare Other | Admitting: Family Medicine

## 2014-05-31 ENCOUNTER — Ambulatory Visit (INDEPENDENT_AMBULATORY_CARE_PROVIDER_SITE_OTHER)
Admission: RE | Admit: 2014-05-31 | Discharge: 2014-05-31 | Disposition: A | Discharge status: Home / Self Care | Payer: Medicare Other | Source: Ambulatory Visit | Attending: Family Medicine | Admitting: Family Medicine

## 2014-05-31 ENCOUNTER — Encounter: Payer: Self-pay | Admitting: Family Medicine

## 2014-05-31 VITALS — BP 146/74 | HR 90 | Temp 99.0°F | Ht 62.0 in | Wt 153.2 lb

## 2014-05-31 DIAGNOSIS — R05 Cough: Secondary | ICD-10-CM

## 2014-05-31 DIAGNOSIS — A084 Viral intestinal infection, unspecified: Secondary | ICD-10-CM | POA: Insufficient documentation

## 2014-05-31 DIAGNOSIS — R059 Cough, unspecified: Secondary | ICD-10-CM

## 2014-05-31 MED ORDER — PROMETHAZINE HCL 25 MG PO TABS: 25.0000 mg | ORAL_TABLET | Freq: Three times a day (TID) | ORAL | Status: DC | PRN

## 2014-05-31 NOTE — Progress Notes (Signed)
Pre visit review using our clinic review tool, if applicable. No additional management support is needed unless otherwise documented below in the visit note. 

## 2014-05-31 NOTE — Assessment & Plan Note (Signed)
Suspect viral uri but in light of recent vomiting - aspiration is in the differential Re assuring exam cxr today   Also has GI virus-will watch carefully -will not px abx unless abs necessary  Disc imp of fluid rehydration

## 2014-05-31 NOTE — Patient Instructions (Signed)
Drink fluids-in sips - all day - we need to get you re hydrated  Also try phenergan for nausea and cough -- it may sedate so be careful  Chest xray today - we will get back to you with a report    Update if not starting to improve in a week or if worsening

## 2014-05-31 NOTE — Assessment & Plan Note (Signed)
Improving - but still some nausea and diarrhea Re assuring exam  Disc sips of fluids and then adv to [employer] as tol  Phenergan px for nausea with warning of sedation  Rev s/s of dehydration to watch for  Update if not starting to improve in a week or if worsening

## 2014-05-31 NOTE — Progress Notes (Signed)
Subjective:    Patient ID: Morgan Hubbard, female    DOB: 1935-04-16, 79 y.o.   MRN: [id number]  HPI Here for uri and GI symptoms   Was exposed to a sick grandchild on 12/25 Then she developed diarrhea- by that night vomiting (just one time) - then dry heaves  Still has diarrhea now - urgency of stools / loose stool -no blood in stool  No abd pain , no cramping  Low grade temp   Coughing started Saturday- followed by hoarseness    (chest sore from coughing and deep cough)-improved now Dry cough -non prod   Was seen at [facility] yesterday - told her she had allergies , suggested allegra  Suggested BRAT diet   Not eating much  Drinking some fluids -not enough   No ear pain  Throat is scratchy  Not a lot of nasal symptoms    She is generally weak Taking allegra  Also robitussin for diabetics   Patient Active Problem List   Diagnosis Date Noted  . UTI (urinary tract infection) 03/01/2014  . Stroke, small vessel 01/18/2014  . Family history of hemochromatosis 01/18/2014  . Cerebral thrombosis with cerebral infarction 01/14/2014  . Expressive aphasia 01/13/2014  . Nonischemic cardiomyopathy 04/05/2013  . Shortness of breath 09/03/2012  . Gallstones 05/26/2012  . Elevated transaminase level 05/18/2012  . Spinal stenosis of lumbar region 12/10/2011  . Mixed incontinence urge and stress 12/10/2011  . Renal insufficiency 11/04/2011  . Goiter 01/14/2011  . Tongue swelling 12/01/2010  . Fatty liver 08/28/2010  . DM 07/09/2010  . HYPERCHOLESTEROLEMIA 07/09/2010  . HYPERKALEMIA 07/09/2010  . REFLEX SYMPATHETIC DYSTROPHY 07/09/2010  . POLYNEUROPATHY 07/09/2010  . HYPERTENSION 07/09/2010  . CARDIOMYOPATHY 07/09/2010  . OVERACTIVE BLADDER 07/09/2010   Past Medical History  Diagnosis Date  . GERD (gastroesophageal reflux disease)   . Ulcer   . Hyperlipidemia   . Colon polyps   . Thyroid disease   . Shingles   . Urine incontinence   . Goiter past remote    treated with RI  .  Other primary cardiomyopathies   . Hyperpotassemia   . Inflammatory and toxic neuropathy, unspecified   . Reflex sympathetic dystrophy, unspecified   . Hypertonicity of bladder   . Hypertension   . Shortness of breath     ambulation  . Vertigo     hx of  . Overactive bladder   . LBBB (left bundle branch block)   . Cardiac LV ejection fraction >40%     "it was 43 last year" (01/13/2014)  . Type II diabetes mellitus   . Migraine     "stopped many years ago" (01/13/2014)  . Arthritis     "hands" (01/13/2014)  . Chronic lower back pain   . Basal cell carcinoma 01/2014    "bridge of nose"  . Expressive aphasia     "3 times in the last week" (01/13/2014)   Past Surgical History  Procedure Laterality Date  . Finger surgery Left [place]    "knot on index"  . Kidney donation Left 1989  . Lumbar laminectomy/decompression microdiscectomy  1999  . Ct scan  3/12    outside hosp- lung nodule and gallstones  . Upper gastrointestinal endoscopy    . Colonoscopy  7/12    normal (hx of polyps in past)   . Back surgery    . Cataract extraction w/ intraocular lens implant Left ~ 2005    several eye injections  . Lumbar  laminectomy/decompression microdiscectomy  01/31/2012    Procedure: LUMBAR LAMINECTOMY/DECOMPRESSION MICRODISCECTOMY 2 LEVELS;  Surgeon: Eustace Moore, MD;  Location: [facility];  Service: Neurosurgery;  Laterality: N/A;  Thoracic twelve-lumbar one, lumbar one-two laminectomy   . Tubal ligation  1976  . Breast cyst excision Left 1959  . Posterior laminectomy / decompression cervical spine  1999  . Cardiac catheterization  "several"    [place]   History  Substance Use Topics  . Smoking status: Never Smoker   . Smokeless tobacco: Never Used  . Alcohol Use: No   Family History  Problem Relation Age of Onset  . Arthritis Mother   . Cancer Mother     uterine and mouth  . Hyperlipidemia Mother   . Stroke Mother   . Hypertension Mother   . Alcohol abuse Father   .  Diabetes Father   . Cancer Sister     breast  . Diabetes Sister   . Cancer Brother     lung cancer  . Kidney disease Brother   . Diabetes Brother   . Hyperlipidemia Sister   . Heart disease Sister   . Hypertension Sister   . Diabetes Sister   . Hyperlipidemia Brother   . Kidney failure Brother   . Diabetes Daughter    Allergies  Allergen Reactions  . Ace Inhibitors Swelling    REACTION: tongue swelling  . Codeine Other (See Comments)    REACTION: nausea  . Lisinopril Swelling    Swelling of tongue  . Nsaids   . Tylenol [Acetaminophen]    Current Outpatient Prescriptions on File Prior to Visit  Medication Sig Dispense Refill  . aspirin 81 MG tablet Take 81 mg by mouth daily.    Marland Kitchen atorvastatin (LIPITOR) 20 MG tablet Take 80 mg by mouth daily.    . carvedilol (COREG) 12.5 MG tablet Take 1 tablet (12.5 mg total) by mouth 2 (two) times daily with a meal. 180 tablet 4  . cholecalciferol (VITAMIN D) 1000 UNITS tablet Take 1,000 Units by mouth daily.    . clopidogrel (PLAVIX) 75 MG tablet Take 1 tablet (75 mg total) by mouth daily. 90 tablet 3  . gabapentin (NEURONTIN) 300 MG capsule Take 1 capsule (300 mg total) by mouth 3 (three) times daily. 270 capsule 1  . glimepiride (AMARYL) 2 MG tablet Take 1 tablet (2 mg total) by mouth 2 (two) times daily. 180 tablet 1  . hydrALAZINE (APRESOLINE) 50 MG tablet Take 1 tablet (50 mg total) by mouth 3 (three) times daily. 270 tablet 3  . HYDROcodone-acetaminophen (NORCO/VICODIN) 5-325 MG per tablet Take 1 tablet by mouth every 6 (six) hours as needed. For pain 30 tablet 0  . isosorbide mononitrate (IMDUR) 30 MG 24 hr tablet Take 1 tablet (30 mg total) by mouth daily. 90 tablet 4  . LORazepam (ATIVAN) 0.5 MG tablet Take 1 by mouth 30-60 minutes before proceedure 2 tablet 0  . LORazepam (ATIVAN) 1 MG tablet Take one tablet by mouth 30 minutes prior to MRI.  May repeat if needed. 2 tablet 0  . metFORMIN (GLUCOPHAGE-XR) 500 MG 24 hr tablet Take  1,000 mg by mouth 2 (two) times daily.    Marland Kitchen MILK THISTLE PO Take 1 capsule by mouth daily.    . mirabegron ER ([name]) 50 MG TB24 Take by mouth daily.    . pantoprazole (PROTONIX) 40 MG tablet Take 1 tablet (40 mg total) by mouth daily. 90 tablet 1  . traMADol (ULTRAM) 50 MG  tablet Take 1 tablet (50 mg total) by mouth daily as needed. 40 tablet 2  . vitamin B-12 (CYANOCOBALAMIN) 1000 MCG tablet Take 1,000 mcg by mouth daily.     No current facility-administered medications on file prior to visit.       Review of Systems Review of Systems  Constitutional: pos for malaise  Eyes: Negative for pain and visual disturbance.  ENT pos for some congestion and post nasal drip /neg for sinus pain  Respiratory: Negative for cough and shortness of breath.   Cardiovascular: Negative for cp or palpitations    Gastrointestinal: Negative for abd pain/ blood in stool or dark stool  Genitourinary: Negative for urgency and frequency.  Skin: Negative for pallor or rash   Neurological: Negative for weakness, light-headedness, numbness and headaches.  Hematological: Negative for adenopathy. Does not bruise/bleed easily.  Psychiatric/Behavioral: Negative for dysphoric mood. The patient is not nervous/anxious.         Objective:   Physical Exam  Constitutional: She appears well-developed and well-nourished. No distress.  HENT:  Head: Normocephalic and atraumatic.  Right Ear: External ear normal.  Left Ear: External ear normal.  Mouth/Throat: Oropharynx is clear and moist. No oropharyngeal exudate.  Nares are boggy No sinus tenderness    Eyes: Conjunctivae and EOM are normal. Pupils are equal, round, and reactive to light. Right eye exhibits no discharge. Left eye exhibits no discharge.  Neck: Normal range of motion. Neck supple.  Cardiovascular: Normal rate and regular rhythm.   Pulmonary/Chest: Effort normal and breath sounds normal. No respiratory distress. She has no wheezes. She has no  rales. She exhibits no tenderness.  No crackles No rales or rhonchi  Wet sounding cough No wheeze   Abdominal: Soft. Bowel sounds are normal. She exhibits no distension and no mass. There is no tenderness. There is no rebound and no guarding.  Musculoskeletal: She exhibits no edema.  Lymphadenopathy:    She has no cervical adenopathy.  Neurological: She is alert.  Skin: Skin is warm and dry. No rash noted. No erythema. No pallor.  Psychiatric: She has a normal mood and affect.          Assessment & Plan:   Problem List Items Addressed This Visit      Digestive   Viral gastroenteritis    Improving - but still some nausea and diarrhea Re assuring exam  Disc sips of fluids and then adv to BRAT diet as tol  Phenergan px for nausea with warning of sedation  Rev s/s of dehydration to watch for  Update if not starting to improve in a week or if worsening        Other   Cough - Primary    Suspect viral uri but in light of recent vomiting - aspiration is in the differential Re assuring exam cxr today   Also has GI virus-will watch carefully -will not px abx unless abs necessary  Disc imp of fluid rehydration     Relevant Orders      DG Chest 2 View (Completed)

## 2014-06-01 ENCOUNTER — Telehealth: Payer: Self-pay | Admitting: Family Medicine

## 2014-06-01 MED ORDER — HYDROCODONE-HOMATROPINE 5-1.5 MG/5ML PO SYRP: 5.0000 mL | ORAL_SOLUTION | Freq: Three times a day (TID) | ORAL | Status: DC | PRN

## 2014-06-01 MED ORDER — DOXYCYCLINE HYCLATE 100 MG PO TABS: 100.0000 mg | ORAL_TABLET | Freq: Two times a day (BID) | ORAL | Status: DC

## 2014-06-01 NOTE — Telephone Encounter (Signed)
Productive cough? What color is sputum? Any fever?  Also how is nausea? d

## 2014-06-01 NOTE — Telephone Encounter (Signed)
Spoke with patient. She said cough was non-productive and she denied fever. Nausea went away with phenergan. She did sound horrible on the phone.

## 2014-06-01 NOTE — Telephone Encounter (Signed)
Patient notified and Rx placed up front for pick up. 

## 2014-06-01 NOTE — Telephone Encounter (Signed)
Now that nausea/ GI symptoms are better I want to cover with an antibiotic We can try hycodan for cough - it may sedate so be careful  Update me if not improving  I sent doxycycline to [facility] and am printing out the hycodan

## 2014-06-01 NOTE — Telephone Encounter (Signed)
Pt left vm on triage phone stating that she saw Dr. [name] on 12/29 for nausea.  She is now requesting RX for cough medication to be sent in for her.  She states that Cheratussin is not effective and she is "coughing with every breath".  [place]

## 2014-06-06 ENCOUNTER — Other Ambulatory Visit (INDEPENDENT_AMBULATORY_CARE_PROVIDER_SITE_OTHER): Payer: Medicare HMO

## 2014-06-06 ENCOUNTER — Ambulatory Visit (INDEPENDENT_AMBULATORY_CARE_PROVIDER_SITE_OTHER): Payer: Medicare HMO | Admitting: Family Medicine

## 2014-06-06 ENCOUNTER — Encounter: Payer: Self-pay | Admitting: Family Medicine

## 2014-06-06 ENCOUNTER — Other Ambulatory Visit: Payer: Self-pay | Admitting: Family Medicine

## 2014-06-06 ENCOUNTER — Telehealth (INDEPENDENT_AMBULATORY_CARE_PROVIDER_SITE_OTHER): Payer: Medicare HMO | Admitting: Family Medicine

## 2014-06-06 VITALS — BP 140/70 | HR 74 | Temp 98.3°F | Ht 62.0 in | Wt 151.5 lb

## 2014-06-06 DIAGNOSIS — J209 Acute bronchitis, unspecified: Secondary | ICD-10-CM

## 2014-06-06 DIAGNOSIS — E7800 Pure hypercholesterolemia, unspecified: Secondary | ICD-10-CM

## 2014-06-06 DIAGNOSIS — I10 Essential (primary) hypertension: Secondary | ICD-10-CM

## 2014-06-06 DIAGNOSIS — J189 Pneumonia, unspecified organism: Secondary | ICD-10-CM | POA: Insufficient documentation

## 2014-06-06 LAB — CBC WITH DIFFERENTIAL/PLATELET
BASOS PCT: 0.3 % (ref 0.0–3.0)
Basophils Absolute: 0 10*3/uL (ref 0.0–0.1)
EOS PCT: 4.3 % (ref 0.0–5.0)
Eosinophils Absolute: 0.4 10*3/uL (ref 0.0–0.7)
HCT: 39.3 % (ref 36.0–46.0)
HEMOGLOBIN: 12.8 g/dL (ref 12.0–15.0)
LYMPHS ABS: 1.9 10*3/uL (ref 0.7–4.0)
Lymphocytes Relative: 19.1 % (ref 12.0–46.0)
MCHC: 32.5 g/dL (ref 30.0–36.0)
MCV: 92.2 fl (ref 78.0–100.0)
MONO ABS: 0.7 10*3/uL (ref 0.1–1.0)
Monocytes Relative: 7.5 % (ref 3.0–12.0)
NEUTROS ABS: 6.8 10*3/uL (ref 1.4–7.7)
Neutrophils Relative %: 68.8 % (ref 43.0–77.0)
Platelets: 304 10*3/uL (ref 150.0–400.0)
RBC: 4.26 Mil/uL (ref 3.87–5.11)
RDW: 12.6 % (ref 11.5–15.5)
WBC: 9.9 10*3/uL (ref 4.0–10.5)

## 2014-06-06 LAB — LIPID PANEL
CHOL/HDL RATIO: 4
Cholesterol: 86 mg/dL (ref 0–200)
HDL: 24 mg/dL — ABNORMAL LOW (ref >39.00)
LDL Cholesterol: 39 mg/dL (ref 0–99)
NonHDL: 62
TRIGLYCERIDES: 114 mg/dL (ref 0.0–149.0)
VLDL: 22.8 mg/dL (ref 0.0–40.0)

## 2014-06-06 LAB — COMPREHENSIVE METABOLIC PANEL
ALK PHOS: 89 U/L (ref 39–117)
ALT: 20 U/L (ref 0–35)
AST: 28 U/L (ref 0–37)
Albumin: 3.1 g/dL — ABNORMAL LOW (ref 3.5–5.2)
BUN: 11 mg/dL (ref 6–23)
CO2: 32 mEq/L (ref 19–32)
Calcium: 9.5 mg/dL (ref 8.4–10.5)
Chloride: 102 mEq/L (ref 96–112)
Creatinine, Ser: 0.8 mg/dL (ref 0.4–1.2)
GFR: 76.02 mL/min (ref >60.00)
GLUCOSE: 113 mg/dL — AB (ref 70–99)
Potassium: 5.5 mEq/L — ABNORMAL HIGH (ref 3.5–5.1)
SODIUM: 143 meq/L (ref 135–145)
Total Bilirubin: 0.6 mg/dL (ref 0.2–1.2)
Total Protein: 6.1 g/dL (ref 6.0–8.3)

## 2014-06-06 LAB — TSH: TSH: 0.79 u[IU]/mL (ref 0.35–4.50)

## 2014-06-06 MED ORDER — BENZONATATE 200 MG PO CAPS: 200.0000 mg | ORAL_CAPSULE | Freq: Three times a day (TID) | ORAL | Status: DC | PRN

## 2014-06-06 NOTE — Patient Instructions (Signed)
Stop hycodan cough syrup-I think it is causing your throat to swell  Take the doxycycline we sent to [facility] Try tessalon for cough - swallow the pills whole  Drink lots of fluids  Get lots of rest   Update if not starting to improve in a week or if worsening

## 2014-06-06 NOTE — Assessment & Plan Note (Signed)
Reassuring exam  With laryngitis  Rev nl cxr last visit  Cover with doxycycline in light of length of illness- is at pharmacy Sent px for tessalon for cough  inst to stop hycodan (it may be causing tongue swelling) Pt aware to go to ER if any trouble breathing   Disc symptomatic care - see instructions on AVS  Update if not starting to improve in a week or if worsening

## 2014-06-06 NOTE — Progress Notes (Signed)
Subjective:    Patient ID: Morgan Hubbard, female    DOB: [birth year], 80 y.o.   MRN: [id number]  HPI Here for continued cough/ uri since last visit   She started the cough med - hycodan - ? If caused tongue to swell or the nausea med  Last time she had viral gastroenteritis  She took phenergan for nausea -it caused her tongue to swell (although it helped her nausea a lot)  The GI condition is better   Has not started the doxycycline yet  Is at the pharmacy   Chest xray was neg at last visit  Non prod cough  Is hoarse  No nasal symptoms or ear or throat pain   Patient Active Problem List   Diagnosis Date Noted  . Cough 05/31/2014  . Viral gastroenteritis 05/31/2014  . UTI (urinary tract infection) 03/01/2014  . Stroke, small vessel 01/18/2014  . Family history of hemochromatosis 01/18/2014  . Cerebral thrombosis with cerebral infarction 01/14/2014  . Expressive aphasia 01/13/2014  . Nonischemic cardiomyopathy 04/05/2013  . Shortness of breath 09/03/2012  . Gallstones 05/26/2012  . Elevated transaminase level 05/18/2012  . Spinal stenosis of lumbar region 12/10/2011  . Mixed incontinence urge and stress 12/10/2011  . Renal insufficiency 11/04/2011  . Goiter 01/14/2011  . Tongue swelling 12/01/2010  . Fatty liver 08/28/2010  . DM 07/09/2010  . HYPERCHOLESTEROLEMIA 07/09/2010  . HYPERKALEMIA 07/09/2010  . REFLEX SYMPATHETIC DYSTROPHY 07/09/2010  . POLYNEUROPATHY 07/09/2010  . Essential hypertension 07/09/2010  . CARDIOMYOPATHY 07/09/2010  . OVERACTIVE BLADDER 07/09/2010   Past Medical History  Diagnosis Date  . GERD (gastroesophageal reflux disease)   . Ulcer   . Hyperlipidemia   . Colon polyps   . Thyroid disease   . Shingles   . Urine incontinence   . Goiter past remote    treated with RI  . Other primary cardiomyopathies   . Hyperpotassemia   . Inflammatory and toxic neuropathy, unspecified   . Reflex sympathetic dystrophy, unspecified   . Hypertonicity  of bladder   . Hypertension   . Shortness of breath     ambulation  . Vertigo     hx of  . Overactive bladder   . LBBB (left bundle branch block)   . Cardiac LV ejection fraction >40%     "it was 43 last year" (01/13/2014)  . Type II diabetes mellitus   . Migraine     "stopped many years ago" (01/13/2014)  . Arthritis     "hands" (01/13/2014)  . Chronic lower back pain   . Basal cell carcinoma 01/2014    "bridge of nose"  . Expressive aphasia     "3 times in the last week" (01/13/2014)   Past Surgical History  Procedure Laterality Date  . Finger surgery Left [place]    "knot on index"  . Kidney donation Left 1989  . Lumbar laminectomy/decompression microdiscectomy  1999  . Ct scan  3/12    outside hosp- lung nodule and gallstones  . Upper gastrointestinal endoscopy    . Colonoscopy  7/12    normal (hx of polyps in past)   . Back surgery    . Cataract extraction w/ intraocular lens implant Left ~ 2005    several eye injections  . Lumbar laminectomy/decompression microdiscectomy  01/31/2012    Procedure: LUMBAR LAMINECTOMY/DECOMPRESSION MICRODISCECTOMY 2 LEVELS;  Surgeon: Eustace Moore, MD;  Location: [facility];  Service: Neurosurgery;  Laterality: N/A;  Thoracic twelve-lumbar one,  lumbar one-two laminectomy   . Tubal ligation  1976  . Breast cyst excision Left 1959  . Posterior laminectomy / decompression cervical spine  1999  . Cardiac catheterization  "several"    [place]   History  Substance Use Topics  . Smoking status: Never Smoker   . Smokeless tobacco: Never Used  . Alcohol Use: No   Family History  Problem Relation Age of Onset  . Arthritis Mother   . Cancer Mother     uterine and mouth  . Hyperlipidemia Mother   . Stroke Mother   . Hypertension Mother   . Alcohol abuse Father   . Diabetes Father   . Cancer Sister     breast  . Diabetes Sister   . Cancer Brother     lung cancer  . Kidney disease Brother   . Diabetes Brother   .  Hyperlipidemia Sister   . Heart disease Sister   . Hypertension Sister   . Diabetes Sister   . Hyperlipidemia Brother   . Kidney failure Brother   . Diabetes Daughter    Allergies  Allergen Reactions  . Ace Inhibitors Swelling    REACTION: tongue swelling  . Codeine Other (See Comments)    REACTION: nausea  . Lisinopril Swelling    Swelling of tongue  . Nsaids   . Tylenol [Acetaminophen]    Current Outpatient Prescriptions on File Prior to Visit  Medication Sig Dispense Refill  . aspirin 81 MG tablet Take 81 mg by mouth daily.    Marland Kitchen atorvastatin (LIPITOR) 20 MG tablet Take 80 mg by mouth daily.    . carvedilol (COREG) 12.5 MG tablet Take 1 tablet (12.5 mg total) by mouth 2 (two) times daily with a meal. 180 tablet 4  . cholecalciferol (VITAMIN D) 1000 UNITS tablet Take 1,000 Units by mouth daily.    . clopidogrel (PLAVIX) 75 MG tablet Take 1 tablet (75 mg total) by mouth daily. 90 tablet 3  . gabapentin (NEURONTIN) 300 MG capsule Take 1 capsule (300 mg total) by mouth 3 (three) times daily. 270 capsule 1  . glimepiride (AMARYL) 2 MG tablet Take 1 tablet (2 mg total) by mouth 2 (two) times daily. 180 tablet 1  . hydrALAZINE (APRESOLINE) 50 MG tablet Take 1 tablet (50 mg total) by mouth 3 (three) times daily. 270 tablet 3  . HYDROcodone-acetaminophen (NORCO/VICODIN) 5-325 MG per tablet Take 1 tablet by mouth every 6 (six) hours as needed. For pain 30 tablet 0  . HYDROcodone-homatropine (HYCODAN) 5-1.5 MG/5ML syrup Take 5 mLs by mouth every 8 (eight) hours as needed for cough. 120 mL 0  . isosorbide mononitrate (IMDUR) 30 MG 24 hr tablet Take 1 tablet (30 mg total) by mouth daily. 90 tablet 4  . LORazepam (ATIVAN) 0.5 MG tablet Take 1 by mouth 30-60 minutes before proceedure 2 tablet 0  . LORazepam (ATIVAN) 1 MG tablet Take one tablet by mouth 30 minutes prior to MRI.  May repeat if needed. 2 tablet 0  . metFORMIN (GLUCOPHAGE-XR) 500 MG 24 hr tablet Take 1,000 mg by mouth 2 (two) times  daily.    Marland Kitchen MILK THISTLE PO Take 1 capsule by mouth daily.    . mirabegron ER ([name]) 50 MG TB24 Take by mouth daily.    . pantoprazole (PROTONIX) 40 MG tablet Take 1 tablet (40 mg total) by mouth daily. 90 tablet 1  . promethazine (PHENERGAN) 25 MG tablet Take 1 tablet (25 mg total) by mouth every 8 (  eight) hours as needed for nausea or vomiting. 20 tablet 0  . traMADol (ULTRAM) 50 MG tablet Take 1 tablet (50 mg total) by mouth daily as needed. 40 tablet 2  . vitamin B-12 (CYANOCOBALAMIN) 1000 MCG tablet Take 1,000 mcg by mouth daily.    Marland Kitchen doxycycline (VIBRA-TABS) 100 MG tablet Take 1 tablet (100 mg total) by mouth 2 (two) times daily. (Patient not taking: Reported on 06/06/2014) 20 tablet 0   No current facility-administered medications on file prior to visit.       Review of Systems Review of Systems  Constitutional: Negative for fever, appetite change,  and unexpected weight change. pos for fatigue ENT neg for sinus pain or ST Eyes: Negative for pain and visual disturbance.  Respiratory: Negative for wheeze  and shortness of breath.   Cardiovascular: Negative for cp or palpitations    Gastrointestinal: Negative for nausea, diarrhea and constipation.  Genitourinary: Negative for urgency and frequency.  Skin: Negative for pallor or rash   Neurological: Negative for weakness, light-headedness, numbness and headaches.  Hematological: Negative for adenopathy. Does not bruise/bleed easily.  Psychiatric/Behavioral: Negative for dysphoric mood. The patient is not nervous/anxious.         Objective:   Physical Exam  Constitutional: She appears well-developed and well-nourished. No distress.  HENT:  Head: Normocephalic and atraumatic.  Right Ear: External ear normal.  Left Ear: External ear normal.  Mouth/Throat: Oropharynx is clear and moist. No oropharyngeal exudate.  Nares are boggy but clear No sinus tenderness   Eyes: Conjunctivae and EOM are normal. Pupils are equal, round,  and reactive to light. Right eye exhibits no discharge. Left eye exhibits no discharge.  Neck: Normal range of motion. Neck supple.  Cardiovascular: Normal rate, regular rhythm and normal heart sounds.   Pulmonary/Chest: Effort normal and breath sounds normal. No respiratory distress. She has no wheezes. She has no rales. She exhibits no tenderness.  Harsh bs  No rales or rhonchi Good air exch   Musculoskeletal: She exhibits no edema.  Lymphadenopathy:    She has no cervical adenopathy.  Neurological: She is alert.  Skin: Skin is warm and dry. No rash noted. No erythema.  Psychiatric: She has a normal mood and affect.          Assessment & Plan:   Problem List Items Addressed This Visit      Respiratory   Acute bronchitis - Primary    Reassuring exam  With laryngitis  Rev nl cxr last visit  Cover with doxycycline in light of length of illness- is at pharmacy Sent px for tessalon for cough  inst to stop hycodan (it may be causing tongue swelling) Pt aware to go to ER if any trouble breathing   Disc symptomatic care - see instructions on AVS  Update if not starting to improve in a week or if worsening

## 2014-06-06 NOTE — Progress Notes (Signed)
Pre visit review using our clinic review tool, if applicable. No additional management support is needed unless otherwise documented below in the visit note. 

## 2014-06-06 NOTE — Telephone Encounter (Signed)
Orders for cpx

## 2014-06-07 ENCOUNTER — Telehealth: Payer: Self-pay | Admitting: Family Medicine

## 2014-06-07 MED ORDER — DOXYCYCLINE HYCLATE 100 MG PO TABS: 100.0000 mg | ORAL_TABLET | Freq: Two times a day (BID) | ORAL | Status: DC

## 2014-06-07 NOTE — Telephone Encounter (Signed)
Electronic refill request, please advise  

## 2014-06-07 NOTE — Telephone Encounter (Signed)
Please refill both for a year  

## 2014-06-07 NOTE — Telephone Encounter (Signed)
[facility] had confusion about doxycycline dose-re sent px

## 2014-06-08 ENCOUNTER — Encounter: Payer: Self-pay | Admitting: Family Medicine

## 2014-06-08 NOTE — Telephone Encounter (Signed)
done

## 2014-06-10 ENCOUNTER — Encounter: Payer: Self-pay | Admitting: Pulmonary Disease

## 2014-06-10 ENCOUNTER — Ambulatory Visit (INDEPENDENT_AMBULATORY_CARE_PROVIDER_SITE_OTHER): Payer: Medicare HMO | Admitting: Pulmonary Disease

## 2014-06-10 VITALS — BP 132/64 | HR 79 | Temp 98.5°F | Ht 62.0 in | Wt 148.8 lb

## 2014-06-10 DIAGNOSIS — R0602 Shortness of breath: Secondary | ICD-10-CM

## 2014-06-10 DIAGNOSIS — R0600 Dyspnea, unspecified: Secondary | ICD-10-CM

## 2014-06-10 NOTE — Assessment & Plan Note (Addendum)
She has severe shortness of breath and not she can only walk about 50-100 feet without stopping. I have explained to Major and her husband today that the differential diagnosis of shortness of breath is quite broad. It includes heart disease, lung disease, hematologic disease, and neurologic disease among others. Objectively, she has normal lungs, and normal oximetry on ambulation, though she was unable to ambulate more than about 100 feet. Her O2 saturation remained above 93% for the entire walk. I have reviewed the images from her chest x-ray and it did not show evidence of significant pulmonary parenchymal disease but clearly it showed cardiomegaly. At this time I am going to look for evidence of lung disease but I do not suspect that based on my findings thus far. I suspect that her shortness of breath is related to her severe congestive heart failure. She does not appear volume overloaded on exam today. She does not have anemia and her neurologic exam is normal.  Plan: -Obtain full pulmonary function testing -High-resolution CT chest to look for evidence of interstitial lung disease -Follow-up in one week to go over these results -consider stress test locally if lung studies normal (recommended by [facility] cardiology)

## 2014-06-10 NOTE — Progress Notes (Signed)
Subjective:    Patient ID: Morgan Hubbard, female    DOB: 1934-08-04, 80 y.o.   MRN: [id number]  HPI  Chief Complaint  Patient presents with  . Advice Only    Referred Self (husband is pt of Shayda Kalka); SOB; some cough   [name] is here to see me for shortness of breath.  She says that it can happen at anytime but she notes PND.  She follows at [facility] cardiology for and had a non-ischemic cardiomyopathy which she has known about for 10 years.  She has never had a heart attack.  She went through three months of pulmonary rehab at [facility] in 2015.  The shortness of breaht has been ongoing since 2012 at least.  She says it is associated with wheezing and chest tightness sometimes.  She also thinks that chronic back pain contributes to the dyspnea.  She never smoked cigarettes.  Her childhood was normal without respiratory problems.  She had a brother with lung cancer.  She says that since her mini stroke her dyspnea has worsened.  She says that she can't get in enough air.  She says that she can only walk about 50 feet or so before she starts to get short of breath. She has limited her activities somewhat severely in that her husband does all the activities around the house now. They do continue to remain active with traveling with their RV. She does not recall any changes in the environment recently. There are no clear environmental triggers for her shortness of breath.  No leg pain or swelling.   No environmental allergies or triggers for dyspnea.     Past Medical History  Diagnosis Date  . GERD (gastroesophageal reflux disease)   . Ulcer   . Hyperlipidemia   . Colon polyps   . Thyroid disease   . Shingles   . Urine incontinence   . Goiter past remote    treated with RI  . Other primary cardiomyopathies   . Hyperpotassemia   . Inflammatory and toxic neuropathy, unspecified   . Reflex sympathetic dystrophy, unspecified   . Hypertonicity of bladder   . Hypertension   . Shortness of breath       ambulation  . Vertigo     hx of  . Overactive bladder   . LBBB (left bundle branch block)   . Cardiac LV ejection fraction >40%     "it was 43 last year" (01/13/2014)  . Type II diabetes mellitus   . Migraine     "stopped many years ago" (01/13/2014)  . Arthritis     "hands" (01/13/2014)  . Chronic lower back pain   . Basal cell carcinoma 01/2014    "bridge of nose"  . Expressive aphasia     "3 times in the last week" (01/13/2014)     Family History  Problem Relation Age of Onset  . Arthritis Mother   . Cancer Mother     uterine and mouth  . Hyperlipidemia Mother   . Stroke Mother   . Hypertension Mother   . Alcohol abuse Father   . Diabetes Father   . Cancer Sister     breast  . Diabetes Sister   . Cancer Brother     lung cancer  . Kidney disease Brother   . Diabetes Brother   . Hyperlipidemia Sister   . Heart disease Sister   . Hypertension Sister   . Diabetes Sister   . Hyperlipidemia Brother   .  Kidney failure Brother   . Diabetes Daughter      History   Social History  . Marital Status: Married    Spouse Name: N/A    Number of Children: 2  . Years of Education: N/A   Occupational History  . retired - Production manager     Social History Main Topics  . Smoking status: Never Smoker   . Smokeless tobacco: Never Used  . Alcohol Use: No  . Drug Use: No  . Sexual Activity: Yes   Other Topics Concern  . Not on file   Social History Narrative   Married 2nd time   Daughter is Surveyor, minerals      Allergies  Allergen Reactions  . Ace Inhibitors Swelling    REACTION: tongue swelling  . Codeine Other (See Comments)    REACTION: nausea  . Lisinopril Swelling    Swelling of tongue  . Nsaids   . Tylenol [Acetaminophen]      Outpatient Prescriptions Prior to Visit  Medication Sig Dispense Refill  . aspirin 81 MG tablet Take 81 mg by mouth daily.    Marland Kitchen atorvastatin (LIPITOR) 20 MG tablet Take 80 mg by mouth daily.    . benzonatate (TESSALON) 200 MG  capsule Take 1 capsule (200 mg total) by mouth 3 (three) times daily as needed for cough (swallow whole). 30 capsule 1  . carvedilol (COREG) 12.5 MG tablet Take 1 tablet (12.5 mg total) by mouth 2 (two) times daily with a meal. 180 tablet 4  . cholecalciferol (VITAMIN D) 1000 UNITS tablet Take 1,000 Units by mouth daily.    . clopidogrel (PLAVIX) 75 MG tablet Take 1 tablet (75 mg total) by mouth daily. 90 tablet 3  . doxycycline (VIBRA-TABS) 100 MG tablet Take 1 tablet (100 mg total) by mouth 2 (two) times daily. 20 tablet 0  . gabapentin (NEURONTIN) 300 MG capsule TAKE 1 BY MOUTH 3 TIMES DAILY 270 capsule 3  . glimepiride (AMARYL) 2 MG tablet Take 1 tablet (2 mg total) by mouth 2 (two) times daily. 180 tablet 1  . hydrALAZINE (APRESOLINE) 50 MG tablet Take 1 tablet (50 mg total) by mouth 3 (three) times daily. (Patient taking differently: Take 50 mg by mouth 2 (two) times daily. ) 270 tablet 3  . HYDROcodone-acetaminophen (NORCO/VICODIN) 5-325 MG per tablet Take 1 tablet by mouth every 6 (six) hours as needed. For pain 30 tablet 0  . isosorbide mononitrate (IMDUR) 30 MG 24 hr tablet Take 1 tablet (30 mg total) by mouth daily. 90 tablet 4  . metFORMIN (GLUCOPHAGE-XR) 500 MG 24 hr tablet Take 1,000 mg by mouth 2 (two) times daily.    Marland Kitchen MILK THISTLE PO Take 1 capsule by mouth daily.    . mirabegron ER ([name]) 50 MG TB24 Take by mouth daily.    . pantoprazole (PROTONIX) 40 MG tablet TAKE 1 BY MOUTH DAILY 90 tablet 3  . promethazine (PHENERGAN) 25 MG tablet Take 1 tablet (25 mg total) by mouth every 8 (eight) hours as needed for nausea or vomiting. 20 tablet 0  . vitamin B-12 (CYANOCOBALAMIN) 1000 MCG tablet Take 1,000 mcg by mouth daily.    Marland Kitchen LORazepam (ATIVAN) 0.5 MG tablet Take 1 by mouth 30-60 minutes before proceedure (Patient not taking: Reported on 06/10/2014) 2 tablet 0  . LORazepam (ATIVAN) 1 MG tablet Take one tablet by mouth 30 minutes prior to MRI.  May repeat if needed. (Patient not  taking: Reported on 06/10/2014) 2 tablet 0  .  traMADol (ULTRAM) 50 MG tablet Take 1 tablet (50 mg total) by mouth daily as needed. (Patient not taking: Reported on 06/10/2014) 40 tablet 2   No facility-administered medications prior to visit.      Review of Systems  Constitutional: Negative for fever and unexpected weight change.  HENT: Positive for congestion. Negative for dental problem, ear pain, nosebleeds, postnasal drip, rhinorrhea, sinus pressure, sneezing, sore throat and trouble swallowing.   Eyes: Negative for redness and itching.  Respiratory: Positive for cough, shortness of breath and wheezing. Negative for chest tightness.   Cardiovascular: Negative for palpitations and leg swelling.  Gastrointestinal: Negative for nausea and vomiting.  Genitourinary: Negative for dysuria.  Musculoskeletal: Negative for joint swelling.  Skin: Negative for rash.  Neurological: Negative for headaches.  Hematological: Does not bruise/bleed easily.  Psychiatric/Behavioral: Negative for dysphoric mood. The patient is not nervous/anxious.        Objective:   Physical Exam Filed Vitals:   06/10/14 0944  BP: 132/64  Pulse: 79  Temp: 98.5 F (36.9 C)  TempSrc: Oral  Height: 5\' 2"  (1.575 m)  Weight: 148 lb 12.8 oz (67.495 kg)  SpO2: 93%    Ambulated 100 feet on room air and O2 saturation remained above 93%  Gen: well appearing, no acute distress HEENT: NCAT, PERRL, EOMi, OP clear, neck supple without masses PULM: CTA B CV: RRR, systolic murmur RUSB, no JVD AB: BS+, soft, nontender, no hsm Ext: warm, no edema, no clubbing, no cyanosis Derm: no rash or skin breakdown Neuro: A&Ox4, CN II-XII intact, strength 5/5 in all 4 extremities  2015 chest x-ray> cardiomegaly noted no pulmonary parenchymal abnormality January 2016 echocardiogram [facility]> LVEF 35%, global hypokinesis, severe LV dysfunction, normal RV size and function RA slightly enlarged Records from [facility] cardiology office  visit reviewed      Assessment & Plan:   Shortness of breath She has severe shortness of breath and not she can only walk about 50-100 feet without stopping. I have explained to [place] and her husband today that the differential diagnosis of shortness of breath is quite broad. It includes heart disease, lung disease, hematologic disease, and neurologic disease among others. Objectively, she has normal lungs, and normal oximetry on ambulation, though she was unable to ambulate more than about 100 feet. Her O2 saturation remained above 93% for the entire walk. I have reviewed the images from her chest x-ray and it did not show evidence of significant pulmonary parenchymal disease but clearly it showed cardiomegaly. At this time I am going to look for evidence of lung disease but I do not suspect that based on my findings thus far. I suspect that her shortness of breath is related to her severe congestive heart failure. She does not appear volume overloaded on exam today. She does not have anemia and her neurologic exam is normal.  Plan: -Obtain full pulmonary function testing -High-resolution CT chest to look for evidence of interstitial lung disease -Follow-up in one week to go over these results -consider stress test locally if lung studies normal (recommended by [facility] cardiology)    Updated Medication List Outpatient Encounter Prescriptions as of 06/10/2014  Medication Sig  . aspirin 81 MG tablet Take 81 mg by mouth daily.  Marland Kitchen atorvastatin (LIPITOR) 20 MG tablet Take 80 mg by mouth daily.  . benzonatate (TESSALON) 200 MG capsule Take 1 capsule (200 mg total) by mouth 3 (three) times daily as needed for cough (swallow whole).  . carvedilol (COREG) 12.5 MG tablet  Take 1 tablet (12.5 mg total) by mouth 2 (two) times daily with a meal.  . cholecalciferol (VITAMIN D) 1000 UNITS tablet Take 1,000 Units by mouth daily.  . clopidogrel (PLAVIX) 75 MG tablet Take 1 tablet (75 mg total) by mouth daily.  Marland Kitchen  doxycycline (VIBRA-TABS) 100 MG tablet Take 1 tablet (100 mg total) by mouth 2 (two) times daily.  Marland Kitchen gabapentin (NEURONTIN) 300 MG capsule TAKE 1 BY MOUTH 3 TIMES DAILY  . glimepiride (AMARYL) 2 MG tablet Take 1 tablet (2 mg total) by mouth 2 (two) times daily.  . hydrALAZINE (APRESOLINE) 50 MG tablet Take 1 tablet (50 mg total) by mouth 3 (three) times daily. (Patient taking differently: Take 50 mg by mouth 2 (two) times daily. )  . HYDROcodone-acetaminophen (NORCO/VICODIN) 5-325 MG per tablet Take 1 tablet by mouth every 6 (six) hours as needed. For pain  . isosorbide mononitrate (IMDUR) 30 MG 24 hr tablet Take 1 tablet (30 mg total) by mouth daily.  . metFORMIN (GLUCOPHAGE-XR) 500 MG 24 hr tablet Take 1,000 mg by mouth 2 (two) times daily.  Marland Kitchen MILK THISTLE PO Take 1 capsule by mouth daily.  . mirabegron ER ([name]) 50 MG TB24 Take by mouth daily.  . pantoprazole (PROTONIX) 40 MG tablet TAKE 1 BY MOUTH DAILY  . promethazine (PHENERGAN) 25 MG tablet Take 1 tablet (25 mg total) by mouth every 8 (eight) hours as needed for nausea or vomiting.  . vitamin B-12 (CYANOCOBALAMIN) 1000 MCG tablet Take 1,000 mcg by mouth daily.  . [DISCONTINUED] LORazepam (ATIVAN) 0.5 MG tablet Take 1 by mouth 30-60 minutes before proceedure (Patient not taking: Reported on 06/10/2014)  . [DISCONTINUED] LORazepam (ATIVAN) 1 MG tablet Take one tablet by mouth 30 minutes prior to MRI.  May repeat if needed. (Patient not taking: Reported on 06/10/2014)  . [DISCONTINUED] traMADol (ULTRAM) 50 MG tablet Take 1 tablet (50 mg total) by mouth daily as needed. (Patient not taking: Reported on 06/10/2014)

## 2014-06-10 NOTE — Patient Instructions (Addendum)
We will arrange a lung function test at [facility] as well as a CT scan of your chest to evaluate your lungs We will see you back here next week to discuss this further

## 2014-06-13 ENCOUNTER — Ambulatory Visit (INDEPENDENT_AMBULATORY_CARE_PROVIDER_SITE_OTHER): Payer: Medicare HMO | Admitting: Family Medicine

## 2014-06-13 ENCOUNTER — Encounter: Payer: Self-pay | Admitting: Family Medicine

## 2014-06-13 VITALS — BP 140/70 | HR 76 | Temp 98.1°F | Ht 60.75 in | Wt 146.5 lb

## 2014-06-13 DIAGNOSIS — Z23 Encounter for immunization: Secondary | ICD-10-CM

## 2014-06-13 DIAGNOSIS — E7800 Pure hypercholesterolemia, unspecified: Secondary | ICD-10-CM

## 2014-06-13 DIAGNOSIS — I10 Essential (primary) hypertension: Secondary | ICD-10-CM

## 2014-06-13 DIAGNOSIS — E875 Hyperkalemia: Secondary | ICD-10-CM

## 2014-06-13 DIAGNOSIS — N289 Disorder of kidney and ureter, unspecified: Secondary | ICD-10-CM

## 2014-06-13 DIAGNOSIS — Z0000 Encounter for general adult medical examination without abnormal findings: Secondary | ICD-10-CM

## 2014-06-13 NOTE — Assessment & Plan Note (Signed)
This is improved Lab Results  Component Value Date   CREATININE 0.8 06/06/2014   BUN 11 06/06/2014   NA 143 06/06/2014   K 5.5* 06/06/2014   CL 102 06/06/2014   CO2 32 06/06/2014   K is high again - unsure of cause  Enc water intake

## 2014-06-13 NOTE — Progress Notes (Signed)
Pre visit review using our clinic review tool, if applicable. No additional management support is needed unless otherwise documented below in the visit note. 

## 2014-06-13 NOTE — Patient Instructions (Signed)
I recommend getting a Tdap (tetanus) vaccine at the health dept. Since medicare does not cover it  prevnar vaccine today  If you are interested in a shingles/zoster vaccine - call your insurance to check on coverage,( you should not get it within 1 month of other vaccines) , then call us for a prescription  for it to take to a pharmacy that gives the shot , or make a nurse visit to get it here depending on your coverage   Call us when you are ready to schedule your screening bone density test   Stop bananas  Schedule non fasting lab in 2 weeks to re check potassium level

## 2014-06-13 NOTE — Progress Notes (Signed)
Subjective:    Patient ID: Morgan Hubbard, female    DOB: Mar 02, 1935, 80 y.o.   MRN: [id number]  HPI Here for annual medicare wellness visit and also acute/chronic medical problems   Wt is down 2 lb with bmi of 27  I have personally reviewed the Medicare Annual Wellness questionnaire and have noted 1. The patient's medical and social history 2. Their use of alcohol, tobacco or illicit drugs 3. Their current medications and supplements 4. The patient's functional ability including ADL's, fall risks, home safety risks and hearing or visual             impairment. 5. Diet and physical activities 6. Evidence for depression or mood disorders  The patients weight, height, BMI have been recorded in the chart and visual acuity is per eye clinic.  I have made referrals, counseling and provided education to the patient based review of the above and I have provided the pt with a written personalized care plan for preventive services.  She took doxycycline for her bronchitis and is doing a lot better  Her EF is lower 35% -going back for re check soon  Deciding whether she needs asa and plavix at the same time   See scanned forms.  Routine anticipatory guidance given to patient.  See health maintenance. Colon cancer screening 7/12 - up to date  Breast cancer screening mammogram 12/15 - was normal  Self breast exam-no lumps  Flu vaccine 11/15  Tetanus vaccine - needs to get at the health dept Pneumovax 8/12 , wants to get prevnar today Zoster vaccine - she had shingles last year - will check on coverage of vaccine  dexa - she had her last one before 2010  - is interested in one  Advance directive -has a living will and POA written up  Cognitive function addressed- see scanned forms- and if abnormal then additional documentation follows.  No concerns     PMH and SH reviewed  Meds, vitals, and allergies reviewed.   ROS: See HPI.  Otherwise negative.      DM-sees endocrinology Sees Dr  Gabriel Carina next month  Blood sugar has been a lot lower / much better  Cannot remember last A1C    Hyperkalemia Lab Results  Component Value Date   K 5.5* 06/06/2014   renal insuff hx  Cr is 0.8 GFR is normal Eats a lot of bananas   Cholesterol On lipitor 80  Lab Results  Component Value Date   CHOL 86 06/06/2014   CHOL 130 03/01/2014   CHOL 250* 01/14/2014   Lab Results  Component Value Date   HDL 24.00* 06/06/2014   HDL 32.90* 03/01/2014   HDL 44 01/14/2014   Lab Results  Component Value Date   LDLCALC 39 06/06/2014   LDLCALC 57 03/01/2014   LDLCALC 150* 01/14/2014   Lab Results  Component Value Date   TRIG 114.0 06/06/2014   TRIG 199.0* 03/01/2014   TRIG 278* 01/14/2014   Lab Results  Component Value Date   CHOLHDL 4 06/06/2014   CHOLHDL 4 03/01/2014   CHOLHDL 5.7 01/14/2014   Lab Results  Component Value Date   LDLDIRECT 151.3 03/02/2013  low HDL - has not been as active  Very low  Cardiology did talk to her about it   bp is stable today  No cp or palpitations or headaches or edema  No side effects to medicines  BP Readings from Last 3 Encounters:  06/13/14 140/70  06/10/14 132/64  06/06/14 140/70    Patient Active Problem List   Diagnosis Date Noted  . Acute bronchitis 06/06/2014  . Cough 05/31/2014  . Viral gastroenteritis 05/31/2014  . UTI (urinary tract infection) 03/01/2014  . Stroke, small vessel 01/18/2014  . Family history of hemochromatosis 01/18/2014  . Cerebral thrombosis with cerebral infarction 01/14/2014  . Expressive aphasia 01/13/2014  . Nonischemic cardiomyopathy 04/05/2013  . Shortness of breath 09/03/2012  . Gallstones 05/26/2012  . Elevated transaminase level 05/18/2012  . Spinal stenosis of lumbar region 12/10/2011  . Mixed incontinence urge and stress 12/10/2011  . Renal insufficiency 11/04/2011  . Goiter 01/14/2011  . Tongue swelling 12/01/2010  . Fatty liver 08/28/2010  . DM 07/09/2010  . HYPERCHOLESTEROLEMIA  07/09/2010  . HYPERKALEMIA 07/09/2010  . REFLEX SYMPATHETIC DYSTROPHY 07/09/2010  . POLYNEUROPATHY 07/09/2010  . Essential hypertension 07/09/2010  . CARDIOMYOPATHY 07/09/2010  . OVERACTIVE BLADDER 07/09/2010   Past Medical History  Diagnosis Date  . GERD (gastroesophageal reflux disease)   . Ulcer   . Hyperlipidemia   . Colon polyps   . Thyroid disease   . Shingles   . Urine incontinence   . Goiter past remote    treated with RI  . Other primary cardiomyopathies   . Hyperpotassemia   . Inflammatory and toxic neuropathy, unspecified   . Reflex sympathetic dystrophy, unspecified   . Hypertonicity of bladder   . Hypertension   . Shortness of breath     ambulation  . Vertigo     hx of  . Overactive bladder   . LBBB (left bundle branch block)   . Cardiac LV ejection fraction >40%     "it was 43 last year" (01/13/2014)  . Type II diabetes mellitus   . Migraine     "stopped many years ago" (01/13/2014)  . Arthritis     "hands" (01/13/2014)  . Chronic lower back pain   . Basal cell carcinoma 01/2014    "bridge of nose"  . Expressive aphasia     "3 times in the last week" (01/13/2014)   Past Surgical History  Procedure Laterality Date  . Finger surgery Left [place]    "knot on index"  . Kidney donation Left 1989  . Lumbar laminectomy/decompression microdiscectomy  1999  . Ct scan  3/12    outside hosp- lung nodule and gallstones  . Upper gastrointestinal endoscopy    . Colonoscopy  7/12    normal (hx of polyps in past)   . Back surgery    . Cataract extraction w/ intraocular lens implant Left ~ 2005    several eye injections  . Lumbar laminectomy/decompression microdiscectomy  01/31/2012    Procedure: LUMBAR LAMINECTOMY/DECOMPRESSION MICRODISCECTOMY 2 LEVELS;  Surgeon: Eustace Moore, MD;  Location: [facility] [facility];  Service: Neurosurgery;  Laterality: N/A;  Thoracic twelve-lumbar one, lumbar one-two laminectomy   . Tubal ligation  1976  . Breast cyst excision Left  1959  . Posterior laminectomy / decompression cervical spine  1999  . Cardiac catheterization  "several"    [place]   History  Substance Use Topics  . Smoking status: Never Smoker   . Smokeless tobacco: Never Used  . Alcohol Use: No   Family History  Problem Relation Age of Onset  . Arthritis Mother   . Cancer Mother     uterine and mouth  . Hyperlipidemia Mother   . Stroke Mother   . Hypertension Mother   . Alcohol abuse Father   . Diabetes Father   .  Cancer Sister     breast  . Diabetes Sister   . Cancer Brother     lung cancer  . Kidney disease Brother   . Diabetes Brother   . Hyperlipidemia Sister   . Heart disease Sister   . Hypertension Sister   . Diabetes Sister   . Hyperlipidemia Brother   . Kidney failure Brother   . Diabetes Daughter    Allergies  Allergen Reactions  . Ace Inhibitors Swelling    REACTION: tongue swelling  . Codeine Other (See Comments)    REACTION: nausea  . Lisinopril Swelling    Swelling of tongue  . Nsaids   . Tylenol [Acetaminophen]    Current Outpatient Prescriptions on File Prior to Visit  Medication Sig Dispense Refill  . aspirin 81 MG tablet Take 81 mg by mouth daily.    . benzonatate (TESSALON) 200 MG capsule Take 1 capsule (200 mg total) by mouth 3 (three) times daily as needed for cough (swallow whole). 30 capsule 1  . carvedilol (COREG) 12.5 MG tablet Take 1 tablet (12.5 mg total) by mouth 2 (two) times daily with a meal. 180 tablet 4  . cholecalciferol (VITAMIN D) 1000 UNITS tablet Take 1,000 Units by mouth daily.    . clopidogrel (PLAVIX) 75 MG tablet Take 1 tablet (75 mg total) by mouth daily. 90 tablet 3  . doxycycline (VIBRA-TABS) 100 MG tablet Take 1 tablet (100 mg total) by mouth 2 (two) times daily. 20 tablet 0  . gabapentin (NEURONTIN) 300 MG capsule TAKE 1 BY MOUTH 3 TIMES DAILY 270 capsule 3  . glimepiride (AMARYL) 2 MG tablet Take 1 tablet (2 mg total) by mouth 2 (two) times daily. 180 tablet 1  .  hydrALAZINE (APRESOLINE) 50 MG tablet Take 1 tablet (50 mg total) by mouth 3 (three) times daily. (Patient taking differently: Take 50 mg by mouth 2 (two) times daily. ) 270 tablet 3  . HYDROcodone-acetaminophen (NORCO/VICODIN) 5-325 MG per tablet Take 1 tablet by mouth every 6 (six) hours as needed. For pain 30 tablet 0  . isosorbide mononitrate (IMDUR) 30 MG 24 hr tablet Take 1 tablet (30 mg total) by mouth daily. 90 tablet 4  . metFORMIN (GLUCOPHAGE-XR) 500 MG 24 hr tablet Take 1,000 mg by mouth 2 (two) times daily.    Marland Kitchen MILK THISTLE PO Take 1 capsule by mouth daily.    . mirabegron ER ([name]) 50 MG TB24 Take by mouth daily.    . pantoprazole (PROTONIX) 40 MG tablet TAKE 1 BY MOUTH DAILY 90 tablet 3  . promethazine (PHENERGAN) 25 MG tablet Take 1 tablet (25 mg total) by mouth every 8 (eight) hours as needed for nausea or vomiting. 20 tablet 0  . vitamin B-12 (CYANOCOBALAMIN) 1000 MCG tablet Take 1,000 mcg by mouth daily.     No current facility-administered medications on file prior to visit.    Review of Systems Review of Systems  Constitutional: Negative for fever, appetite change,  and unexpected weight change.  Eyes: Negative for pain and visual disturbance.  Respiratory: Negative for cough and pos for baseline  shortness of breath.   Cardiovascular: Negative for cp or palpitations    Gastrointestinal: Negative for nausea, diarrhea and constipation.  Genitourinary: Negative for urgency and frequency.  Skin: Negative for pallor or rash   MSK neg for muscle cramping  Neurological: Negative for weakness, light-headedness, numbness and headaches.  Hematological: Negative for adenopathy. Does not bruise/bleed easily.  Psychiatric/Behavioral: Negative for dysphoric mood. The  patient is not nervous/anxious.         Objective:   Physical Exam  Constitutional: She appears well-developed and well-nourished. No distress.  overwt and well app  HENT:  Head: Normocephalic and  atraumatic.  Right Ear: External ear normal.  Left Ear: External ear normal.  Nose: Nose normal.  Mouth/Throat: Oropharynx is clear and moist.  Eyes: Conjunctivae and EOM are normal. Pupils are equal, round, and reactive to light. Right eye exhibits no discharge. Left eye exhibits no discharge. No scleral icterus.  Neck: Normal range of motion. Neck supple. No JVD present. No thyromegaly present.  Cardiovascular: Normal rate, regular rhythm and intact distal pulses.   Pulmonary/Chest: Effort normal and breath sounds normal. No respiratory distress. She has no wheezes. She has no rales.  No crackles   Abdominal: Soft. Bowel sounds are normal. She exhibits no distension and no mass. There is no tenderness.  Musculoskeletal: Normal range of motion. She exhibits no edema or tenderness.  Lymphadenopathy:    She has no cervical adenopathy.  Neurological: She is alert. She has normal reflexes. No cranial nerve deficit. She exhibits normal muscle tone. Coordination normal.  Skin: Skin is warm and dry. No rash noted. No erythema. No pallor.  Solar lentigos diffusely   Psychiatric: She has a normal mood and affect.          Assessment & Plan:   Problem List Items Addressed This Visit      Cardiovascular and Mediastinum   Essential hypertension    bp in fair control at this time  BP Readings from Last 1 Encounters:  06/13/14 140/70   No changes needed Disc lifstyle change with low sodium diet and exercise   Labs reviewed     Relevant Medications      atorvastatin (LIPITOR) 80 MG tablet     Genitourinary   Renal insufficiency    This is improved Lab Results  Component Value Date   CREATININE 0.8 06/06/2014   BUN 11 06/06/2014   NA 143 06/06/2014   K 5.5* 06/06/2014   CL 102 06/06/2014   CO2 32 06/06/2014   K is high again - unsure of cause  Enc water intake       Other   Encounter for Medicare annual wellness exam - Primary    Reviewed health habits including diet and  exercise and skin cancer prevention Reviewed appropriate screening tests for age  Also reviewed health mt list, fam hx and immunization status , as well as social and family history   See HPI Labs reviewed prevnar vaccine today  She will look into getting Tdap at the health dept  Also check on coverage of shingles vaccine and let us know She will call to schedule dexa when ready     HYPERCHOLESTEROLEMIA    On lipitor 80 mg  Low HDL - but unable to exercise at this time Disc goals for lipids and reasons to control them Rev labs with pt Rev low sat fat diet in detail     Relevant Medications      atorvastatin (LIPITOR) 80 MG tablet   HYPERKALEMIA    Recurrent Lab Results  Component Value Date   K 5.5* 06/06/2014   Will stop eating bananas and high K fruits  Re check 2 wk  In past - occ needs kayexelate     Relevant Orders      Potassium    Other Visit Diagnoses    Need for vaccination with 13-polyvalent pneumococcal  conjugate vaccine        Relevant Orders       Pneumococcal conjugate vaccine 13-valent (Completed)

## 2014-06-13 NOTE — Assessment & Plan Note (Signed)
Reviewed health habits including diet and exercise and skin cancer prevention Reviewed appropriate screening tests for age  Also reviewed health mt list, fam hx and immunization status , as well as social and family history   See HPI Labs reviewed prevnar vaccine today  She will look into getting Tdap at the health dept  Also check on coverage of shingles vaccine and let us know She will call to schedule dexa when ready

## 2014-06-13 NOTE — Assessment & Plan Note (Signed)
bp in fair control at this time  BP Readings from Last 1 Encounters:  06/13/14 140/70   No changes needed Disc lifstyle change with low sodium diet and exercise   Labs reviewed

## 2014-06-13 NOTE — Assessment & Plan Note (Signed)
Recurrent Lab Results  Component Value Date   K 5.5* 06/06/2014   Will stop eating bananas and high K fruits  Re check 2 wk  In past - occ needs kayexelate

## 2014-06-13 NOTE — Assessment & Plan Note (Signed)
On lipitor 80 mg  Low HDL - but unable to exercise at this time Disc goals for lipids and reasons to control them Rev labs with pt Rev low sat fat diet in detail

## 2014-06-14 ENCOUNTER — Ambulatory Visit (HOSPITAL_COMMUNITY)
Admission: RE | Admit: 2014-06-14 | Discharge: 2014-06-14 | Disposition: A | Discharge status: Home / Self Care | Payer: Medicare HMO | Source: Ambulatory Visit | Attending: Pulmonary Disease | Admitting: Pulmonary Disease

## 2014-06-14 ENCOUNTER — Ambulatory Visit (INDEPENDENT_AMBULATORY_CARE_PROVIDER_SITE_OTHER)
Admission: RE | Admit: 2014-06-14 | Discharge: 2014-06-14 | Disposition: A | Discharge status: Home / Self Care | Payer: Medicare HMO | Source: Ambulatory Visit | Attending: Pulmonary Disease | Admitting: Pulmonary Disease

## 2014-06-14 DIAGNOSIS — R0600 Dyspnea, unspecified: Secondary | ICD-10-CM

## 2014-06-14 LAB — PULMONARY FUNCTION TEST
DL/VA % PRED: 83 %
DL/VA: 3.67 ml/min/mmHg/L
DLCO COR: 10.13 ml/min/mmHg
DLCO UNC % PRED: 49 %
DLCO cor % pred: 50 %
DLCO unc: 9.94 ml/min/mmHg
FEF 25-75 POST: 1.61 L/s
FEF 25-75 PRE: 1.19 L/s
FEF2575-%CHANGE-POST: 35 %
FEF2575-%PRED-POST: 117 %
FEF2575-%Pred-Pre: 86 %
FEV1-%Change-Post: 7 %
FEV1-%PRED-POST: 72 %
FEV1-%PRED-PRE: 67 %
FEV1-Post: 1.27 L
FEV1-Pre: 1.18 L
FEV1FVC-%Change-Post: -5 %
FEV1FVC-%Pred-Pre: 108 %
FEV6-%Change-Post: 13 %
FEV6-%Pred-Post: 74 %
FEV6-%Pred-Pre: 65 %
FEV6-POST: 1.66 L
FEV6-Pre: 1.46 L
FEV6FVC-%CHANGE-POST: 0 %
FEV6FVC-%Pred-Post: 106 %
FEV6FVC-%Pred-Pre: 106 %
FVC-%CHANGE-POST: 13 %
FVC-%Pred-Post: 70 %
FVC-%Pred-Pre: 62 %
FVC-Post: 1.66 L
FVC-Pre: 1.46 L
POST FEV1/FVC RATIO: 76 %
PRE FEV1/FVC RATIO: 80 %
Post FEV6/FVC ratio: 100 %
Pre FEV6/FVC Ratio: 100 %
RV % pred: 73 %
RV: 1.6 L
TLC % pred: 72 %
TLC: 3.32 L

## 2014-06-14 MED ORDER — IOHEXOL 350 MG/ML SOLN
80.0000 mL | Freq: Once | INTRAVENOUS | Status: AC | PRN
  Administered 2014-06-14: 80 mL via INTRAVENOUS

## 2014-06-14 MED ORDER — ALBUTEROL SULFATE (2.5 MG/3ML) 0.083% IN NEBU
2.5000 mg | INHALATION_SOLUTION | Freq: Once | RESPIRATORY_TRACT | Status: AC
  Administered 2014-06-14: 2.5 mg via RESPIRATORY_TRACT

## 2014-06-15 ENCOUNTER — Telehealth: Payer: Self-pay

## 2014-06-15 NOTE — Telephone Encounter (Signed)
Spoke with pt, states she is not currently running a fever but is having a nonprod cough.  Pt is finishing up her last dose of doxy 100 bid X7 days tonight.    Dr. Lake Bells do you want her to have a second dose of doxy?  Thanks!

## 2014-06-15 NOTE — Telephone Encounter (Signed)
-----   Message from Juanito Doom, MD sent at 06/15/2014  6:12 AM EST ----- A, Please let her know that her CXR showed possible pneumonia.  Please ask her if she has had a fever, chills, or cough in the last few days.  If the answer to any of those is "yes", then prescribe doxycycline 100mg  po bid x7 days.  Thanks B

## 2014-06-16 NOTE — Telephone Encounter (Signed)
No, I didn't realize her PCP had already written for it for laryngitis.  One round is enough.  thanks

## 2014-06-16 NOTE — Telephone Encounter (Signed)
Called and spoke to pt. Informed pt of the recs per BQ. Pt seemed a little uneasy about not taking additional abx. Informed pt that the medication she was taking was exactly what BQ was going to treat her with and even though she has stopped taking the medication doesn't mean she will not improve. Informed pt to call if her s/s do not improve or worsen. Pt verbalized understanding and denied any further questions or concerns at this time.   Will forward to BQ as FYI.

## 2014-06-20 ENCOUNTER — Encounter: Payer: Self-pay | Admitting: Pulmonary Disease

## 2014-06-20 ENCOUNTER — Ambulatory Visit (INDEPENDENT_AMBULATORY_CARE_PROVIDER_SITE_OTHER): Payer: Medicare HMO | Admitting: Pulmonary Disease

## 2014-06-20 VITALS — BP 130/66 | HR 75 | Ht 62.0 in | Wt 149.0 lb

## 2014-06-20 DIAGNOSIS — J189 Pneumonia, unspecified organism: Secondary | ICD-10-CM

## 2014-06-20 DIAGNOSIS — R0602 Shortness of breath: Secondary | ICD-10-CM

## 2014-06-20 MED ORDER — ALPRAZOLAM 0.5 MG PO TABS: 0.5000 mg | ORAL_TABLET | Freq: Once | ORAL | Status: DC

## 2014-06-20 NOTE — Patient Instructions (Signed)
We will arrange another CT chest 3 months from the time that the last one was performed, use the Xanax I prescribed on the day of the test Take the cough medicine that was prescribed by your PCP We will see you back after the CT scan

## 2014-06-20 NOTE — Progress Notes (Signed)
Subjective:    Patient ID: Morgan Hubbard, female    DOB: Oct 16, 1934, 80 y.o.   MRN: [id number]  Synopsis: Morgan Hubbard was referred to the [facility] pulmonary clinic in 2016 for evaluation of shortness of breath. She has a history of nonischemic cardiomyopathy with a 2016 echocardiogram showing an LVEF of 35%. Gen. long smoking history. A CT chest performed in January 2016 showed patchy infiltrates consistent with community acquired pneumonia.  Other testing is detailed below:  2015 chest x-ray> cardiomegaly noted no pulmonary parenchymal abnormality January 2016 echocardiogram [facility]> LVEF 35%, global hypokinesis, severe LV dysfunction, normal RV size and function RA slightly enlarged 06/2014 CT chest> Cardiomegally, no PE, patchy, nodular airspace disease throughout R lung of uncertain etiology, pneumonia? 06/2014 PFT> Ratio 76%, FEV1 1.27L (72%, 7% change), TLC 3.32L (72%  Pred), DLCO 9.94 (49% pred)  HPI Chief Complaint  Patient presents with  . Follow-up    2 week rov.  pt c/o sob with exertion, nonprod cough, raspy voice.  Pt states she wishes to have another abx.   [name] comes to clinic today stating that she is feeling somewhat better since the last visit. However, she continues to have a cough as well as a raspy throat. She does not have worsening shortness of breath. She has not been coughing up mucus. She has not had fevers or chills. She denies chest pain. She is frustrated that we did not call in a prescription for more antibiotics after she received 7 days of doxycycline. She continues to take the medications as prescribed by the [facility] cardiology clinic for her heart failure.  Past Medical History  Diagnosis Date  . GERD (gastroesophageal reflux disease)   . Ulcer   . Hyperlipidemia   . Colon polyps   . Thyroid disease   . Shingles   . Urine incontinence   . Goiter past remote    treated with RI  . Other primary cardiomyopathies   . Hyperpotassemia   .  Inflammatory and toxic neuropathy, unspecified   . Reflex sympathetic dystrophy, unspecified   . Hypertonicity of bladder   . Hypertension   . Shortness of breath     ambulation  . Vertigo     hx of  . Overactive bladder   . LBBB (left bundle branch block)   . Cardiac LV ejection fraction >40%     "it was 43 last year" (01/13/2014)  . Type II diabetes mellitus   . Migraine     "stopped many years ago" (01/13/2014)  . Arthritis     "hands" (01/13/2014)  . Chronic lower back pain   . Basal cell carcinoma 01/2014    "bridge of nose"  . Expressive aphasia     "3 times in the last week" (01/13/2014)      Review of Systems  Constitutional: Positive for fatigue. Negative for fever and chills.  HENT: Negative for nosebleeds, postnasal drip, rhinorrhea and sinus pressure.   Respiratory: Positive for cough and shortness of breath. Negative for wheezing.   Cardiovascular: Negative for chest pain, palpitations and leg swelling.       Objective:   Physical Exam Filed Vitals:   06/20/14 1709  BP: 130/66  Pulse: 75  Height: 5\' 2"  (1.575 m)  Weight: 149 lb (67.586 kg)  SpO2: 95%    Gen: well appearing, no acute distress HEENT: NCAT,  EOMi, OP clear, PULM: CTA B CV: RRR, systolic murmur noted, no JVD AB: BS+, soft, nontender Ext: warm,  no edema, no clubbing, no cyanosis Derm: no rash or skin breakdown Neuro: A&Ox4, MAEW  Images from the January 2016 CT chest were reviewed in clinic today Recent notes from her primary care physician were reviewed in clinic today      Assessment & Plan:   Shortness of breath After I saw Morgan Hubbard on the last visit she developed a cough with some chest congestion. Around this time she had a CT chest which showed community-acquired pneumonia. There is also marked enlargement of her heart seen on that study.  I explained to her that in the short run her recent episode of community acquired pneumonia may explain her dyspnea to some degree, but in the  long run I do not see clear evidence of other underlying lung disease. See discussion below. Obviously with her significant congestive heart failure it's difficult to exclude that as a cause of her shortness of breath.  I am encouraged by the fact that her pulmonary function testing did not show airways disease. There was mild restriction which can be seen with advanced heart failure. Her DLCO was slightly decreased which is likely attributed to the pneumonia. So in summary there is no clear evidence of lung disease at this time aside from the recent episode of pneumonia.  Plan: -Continue management of heart failure per [facility] cardiology -Plan repeat CT chest in 3 months as detailed below -If her symptoms do not improve this year then consider repeat pulmonary function test to reevaluate the decreased DLCO   CAP (community acquired pneumonia) Elyanna was noted to have patchy airspace disease on her CT chest which looked most consistent with community-acquired pneumonia. This went along with her cough, chest congestion and dyspnea. She was completed with 1 week of doxycycline. Fortunately, her vital signs are normal today and her symptoms have improved. She continues to have a cough which I have explained to her is in keeping with the natural history of community acquired pneumonia.  She wants another round of antibiotics but in the setting of no fevers, chills, or purulent sputum there is not indication for this right now.  I explained to her today that we need to make sure that the patchy airspace disease clears up, so I have recommended a repeat CT chest in 3 months.  We discussed doing it sooner (6 weeks) by think 3 months is probably better as it often takes that long for community-acquired pneumonia to actually go away radiographically.  Plan: -Repeat CT chest in 3 months  -continue cough medicine per primary care physician  > 25 minutes spent in direct consultation with she and her  husband reviewing my assessment and plan  Updated Medication List Outpatient Encounter Prescriptions as of 06/20/2014  Medication Sig  . aspirin 81 MG tablet Take 81 mg by mouth daily.  Marland Kitchen atorvastatin (LIPITOR) 80 MG tablet Take 80 mg by mouth daily.  . benzonatate (TESSALON) 200 MG capsule Take 1 capsule (200 mg total) by mouth 3 (three) times daily as needed for cough (swallow whole).  . carvedilol (COREG) 12.5 MG tablet Take 1 tablet (12.5 mg total) by mouth 2 (two) times daily with a meal.  . cholecalciferol (VITAMIN D) 1000 UNITS tablet Take 1,000 Units by mouth daily.  . clopidogrel (PLAVIX) 75 MG tablet Take 1 tablet (75 mg total) by mouth daily.  Marland Kitchen gabapentin (NEURONTIN) 300 MG capsule TAKE 1 BY MOUTH 3 TIMES DAILY  . glimepiride (AMARYL) 2 MG tablet Take 1 tablet (2 mg total) by  mouth 2 (two) times daily.  . hydrALAZINE (APRESOLINE) 50 MG tablet Take 1 tablet (50 mg total) by mouth 3 (three) times daily. (Patient taking differently: Take 50 mg by mouth 2 (two) times daily. )  . HYDROcodone-acetaminophen (NORCO/VICODIN) 5-325 MG per tablet Take 1 tablet by mouth every 6 (six) hours as needed. For pain  . isosorbide mononitrate (IMDUR) 30 MG 24 hr tablet Take 1 tablet (30 mg total) by mouth daily.  . metFORMIN (GLUCOPHAGE-XR) 500 MG 24 hr tablet Take 1,000 mg by mouth 2 (two) times daily.  Marland Kitchen MILK THISTLE PO Take 1 capsule by mouth daily.  . mirabegron ER ([name]) 50 MG TB24 Take by mouth daily.  . pantoprazole (PROTONIX) 40 MG tablet TAKE 1 BY MOUTH DAILY  . promethazine (PHENERGAN) 25 MG tablet Take 1 tablet (25 mg total) by mouth every 8 (eight) hours as needed for nausea or vomiting.  . vitamin B-12 (CYANOCOBALAMIN) 1000 MCG tablet Take 1,000 mcg by mouth daily.  Marland Kitchen ALPRAZolam (XANAX) 0.5 MG tablet Take 1 tablet (0.5 mg total) by mouth once.  . [DISCONTINUED] doxycycline (VIBRA-TABS) 100 MG tablet Take 1 tablet (100 mg total) by mouth 2 (two) times daily. (Patient not taking:  Reported on 06/20/2014)

## 2014-06-20 NOTE — Assessment & Plan Note (Addendum)
After I saw Morgan Hubbard on the last visit she developed a cough with some chest congestion. Around this time she had a CT chest which showed community-acquired pneumonia. There is also marked enlargement of her heart seen on that study.  I explained to her that in the short run her recent episode of community acquired pneumonia may explain her dyspnea to some degree, but in the long run I do not see clear evidence of other underlying lung disease. See discussion below. Obviously with her significant congestive heart failure it's difficult to exclude that as a cause of her shortness of breath.  I am encouraged by the fact that her pulmonary function testing did not show airways disease. There was mild restriction which can be seen with advanced heart failure. Her DLCO was slightly decreased which is likely attributed to the pneumonia. So in summary there is no clear evidence of lung disease at this time aside from the recent episode of pneumonia.  Plan: -Continue management of heart failure per [facility] cardiology -Plan repeat CT chest in 3 months as detailed below -If her symptoms do not improve this year then consider repeat pulmonary function test to reevaluate the decreased DLCO

## 2014-06-20 NOTE — Assessment & Plan Note (Addendum)
Morgan Hubbard was noted to have patchy airspace disease on her CT chest which looked most consistent with community-acquired pneumonia. This went along with her cough, chest congestion and dyspnea. She was completed with 1 week of doxycycline. Fortunately, her vital signs are normal today and her symptoms have improved. She continues to have a cough which I have explained to her is in keeping with the natural history of community acquired pneumonia.  She wants another round of antibiotics but in the setting of no fevers, chills, or purulent sputum there is not indication for this right now.  I explained to her today that we need to make sure that the patchy airspace disease clears up, so I have recommended a repeat CT chest in 3 months.  We discussed doing it sooner (6 weeks) by think 3 months is probably better as it often takes that long for community-acquired pneumonia to actually go away radiographically.  Plan: -Repeat CT chest in 3 months  -continue cough medicine per primary care physician

## 2014-06-23 ENCOUNTER — Ambulatory Visit: Payer: Medicare HMO | Admitting: Pulmonary Disease

## 2014-06-27 ENCOUNTER — Other Ambulatory Visit: Payer: Medicare HMO

## 2014-07-05 ENCOUNTER — Other Ambulatory Visit (INDEPENDENT_AMBULATORY_CARE_PROVIDER_SITE_OTHER): Payer: Medicare HMO

## 2014-07-05 DIAGNOSIS — E875 Hyperkalemia: Secondary | ICD-10-CM

## 2014-07-05 LAB — POTASSIUM: Potassium: 4.7 mEq/L (ref 3.5–5.1)

## 2014-07-29 ENCOUNTER — Encounter: Payer: Self-pay | Admitting: Family Medicine

## 2014-08-01 ENCOUNTER — Telehealth: Payer: Self-pay | Admitting: Family Medicine

## 2014-08-01 MED ORDER — HYDROCODONE-ACETAMINOPHEN 5-325 MG PO TABS: 1.0000 | ORAL_TABLET | Freq: Four times a day (QID) | ORAL | Status: DC | PRN

## 2014-08-01 NOTE — Telephone Encounter (Signed)
Please call pt and let her know her vicodin is avail for pick up Thanks

## 2014-08-01 NOTE — Telephone Encounter (Signed)
Patient left a voicemail stating that she got a message from Dr. [name] wanting to know which pharmacy to call the Vicodin to and it is [facility]. This prescription has to be picked up.

## 2014-08-02 ENCOUNTER — Telehealth: Payer: Self-pay | Admitting: *Deleted

## 2014-08-02 NOTE — Telephone Encounter (Signed)
Called patient back. She had it done already at another office asscoiated with [facility].

## 2014-08-02 NOTE — Telephone Encounter (Signed)
Received a fax request to do lab for Dr Adelfa Koh Ward from [facility] for a BMP.  We cannot do lab for other (outside) physicians.  Called and left voicemail for patient to call back.

## 2014-08-02 NOTE — Telephone Encounter (Signed)
Pt returned call. Please call on her cell @ [phone]

## 2014-08-02 NOTE — Telephone Encounter (Signed)
Called patient and notified rx is ready for pick up. Left in front office.

## 2014-09-26 ENCOUNTER — Ambulatory Visit (INDEPENDENT_AMBULATORY_CARE_PROVIDER_SITE_OTHER)
Admission: RE | Admit: 2014-09-26 | Discharge: 2014-09-26 | Disposition: A | Discharge status: Home / Self Care | Payer: Medicare HMO | Source: Ambulatory Visit | Attending: Pulmonary Disease | Admitting: Pulmonary Disease

## 2014-09-26 DIAGNOSIS — R0602 Shortness of breath: Secondary | ICD-10-CM | POA: Diagnosis not present

## 2014-09-27 ENCOUNTER — Telehealth: Payer: Self-pay | Admitting: Pulmonary Disease

## 2014-09-27 NOTE — Progress Notes (Signed)
Quick Note:  Spoke with pt and notified of results per Dr. McQuaid. Pt verbalized understanding and denied any questions.  ______ 

## 2014-09-27 NOTE — Telephone Encounter (Signed)
Notes Recorded by Juanito Doom, MD on 09/26/2014 at 1:14 PM A, Please let her know that her CT scan showed the pneumonia has cleared up and the nodule is stable and she doesn't need any more CT scans. Thanks B  Spoke with pt and notified of results per Dr. Lake Bells. Pt verbalized understanding and denied any questions.

## 2014-10-02 HISTORY — PX: BI-VENTRICULAR PACEMAKER INSERTION (CRT-P): SHX5750

## 2014-10-03 ENCOUNTER — Ambulatory Visit (INDEPENDENT_AMBULATORY_CARE_PROVIDER_SITE_OTHER): Payer: Medicare HMO | Admitting: Family Medicine

## 2014-10-03 ENCOUNTER — Encounter: Payer: Self-pay | Admitting: Family Medicine

## 2014-10-03 VITALS — BP 130/70 | HR 68 | Temp 98.3°F | Ht 62.0 in | Wt 151.5 lb

## 2014-10-03 DIAGNOSIS — M19011 Primary osteoarthritis, right shoulder: Secondary | ICD-10-CM

## 2014-10-03 DIAGNOSIS — M259 Joint disorder, unspecified: Secondary | ICD-10-CM

## 2014-10-03 DIAGNOSIS — M19019 Primary osteoarthritis, unspecified shoulder: Secondary | ICD-10-CM

## 2014-10-03 MED ORDER — METHYLPREDNISOLONE ACETATE 40 MG/ML IJ SUSP
20.0000 mg | Freq: Once | INTRAMUSCULAR | Status: AC
  Administered 2014-10-03: 20 mg via INTRA_ARTICULAR

## 2014-10-03 MED ORDER — METHYLPREDNISOLONE ACETATE 40 MG/ML IJ SUSP
80.0000 mg | Freq: Once | INTRAMUSCULAR | Status: AC
  Administered 2014-10-03: 80 mg via INTRA_ARTICULAR

## 2014-10-03 NOTE — Progress Notes (Signed)
Dr. Frederico Hamman T. Dolphus Linch, MD, [facility] [place], [address] Phone: [phone] Fax: [phone]  10/03/2014  Patient: Morgan Hubbard, MRN: [id number], DOB: 1935/05/26, 80 y.o.  Primary Physician:  [name], MD  Chief Complaint: Shoulder Pain  Subjective:   Morgan Hubbard is a 80 y.o. very pleasant female patient who presents with the following:  R shoulder follow-up:  The patient is very well-known to me.  She is had some difficulties from a medical standpoint over the last year, and she has had a stroke, and she also has worsening congestive heart failure with an EF of 25%.  She is going to have a pacemaker within the next week or 2.  She presents today with ongoing shoulder pain.  I last saw her about a year ago, and at that point she was having some significant arthritis in her shoulder, and she has known fairly advanced acromioclavicular arthritic changes as well as at least some mild changes in the glenohumeral joint.  Last year I did an intra-articular injection in the glenohumeral joint and I didn't acromioclavicular joint injection at the same time.  She said that this helped quite a bit and she felt basically pain-free for about 9 months.  25% EF on MRI/MRA CVA in the last year.   R AC  And GH joint  Past Medical History, Surgical History, Social History, Family History, Problem List, Medications, and Allergies have been reviewed and updated if relevant.  GEN: No fevers, chills. Nontoxic. Primarily MSK c/o today. MSK: Detailed in the HPI GI: tolerating PO intake without difficulty Neuro: No numbness, parasthesias, or tingling associated. Otherwise the pertinent positives of the ROS are noted above.   Objective:   BP 130/70 mmHg  Pulse 68  Temp(Src) 98.3 F (36.8 C) (Oral)  Ht 5\' 2"  (1.575 m)  Wt 151 lb 8 oz (68.72 kg)  BMI 27.70 kg/m2  LMP 06/03/1992   GEN: WDWN, NAD, Non-toxic, Alert & Oriented x  3 HEENT: Atraumatic, Normocephalic.  Ears and Nose: No external deformity. EXTR: No clubbing/cyanosis/edema NEURO: Normal gait.  PSYCH: Normally interactive. Conversant. Not depressed or anxious appearing.  Calm demeanor.   Shoulder: R Inspection: No muscle wasting or winging Ecchymosis/edema: neg  AC joint, scapula, clavicle: TTP at the [facility] joint Cervical spine: NT, full ROM Spurling's: neg Abduction: full, 5/5 Flexion: full, 5/5 IR, full, lift-off: 5/5 ER at neutral: full, 5/5 AC crossover and compression: pos Neer: neg Hawkins: pos Drop Test: neg Empty Can: neg Supraspinatus insertion: NT Bicipital groove: NT Speed's: neg Yergason's: neg Sulcus sign: neg Scapular dyskinesis: none C5-T1 intact Sensation intact Grip 5/5   Radiology:   Assessment and Plan:   Glenohumeral arthritis, right - Plan: methylPREDNISolone acetate (DEPO-MEDROL) injection 80 mg  AC joint arthropathy - Plan: methylPREDNISolone acetate (DEPO-MEDROL) injection 20 mg  For pain management, reasonable to do intermittent GH and AC injection. Good response before.   Intrarticular Shoulder Injection, R Verbal consent was obtained from the patient. Risks including infection explained and contrasted with benefits and alternatives. Patient prepped with Chloraprep and Ethyl Chloride used for anesthesia. An intraarticular shoulder injection was performed using the posterior approach. The patient tolerated the procedure well and had decreased pain post injection. No complications. Injection: 8 cc of Lidocaine 1% and Depo-Medrol 80 mg. Needle: 22 gauge   AC Joint Injection, R Verbal consent was obtained from the patient. Risks, benefits, and alternatives have been reviewed including possible  infection. The patient was prepped with Chloraprep. Ethyl Chloride used for anesthesia. Under sterile conditions, 1/2 cc of Lidocaine 1% and Depo-Medrol 20 mg directly injected into at the superior-lateral AC joint. The  patient tolerated the procedure well and had decreased symptoms after injection. No complications.    Signed,  Morgan Hubbard. Morgan Lein, MD   Patient's Medications  New Prescriptions   No medications on file  Previous Medications   ASPIRIN 81 MG TABLET    Take 81 mg by mouth daily.   ATORVASTATIN (LIPITOR) 80 MG TABLET    Take 80 mg by mouth daily.   CARVEDILOL (COREG) 12.5 MG TABLET    Take 1 tablet (12.5 mg total) by mouth 2 (two) times daily with a meal.   CHOLECALCIFEROL (VITAMIN D) 1000 UNITS TABLET    Take 1,000 Units by mouth daily.   CLOPIDOGREL (PLAVIX) 75 MG TABLET    Take 1 tablet (75 mg total) by mouth daily.   GABAPENTIN (NEURONTIN) 300 MG CAPSULE    TAKE 1 BY MOUTH 3 TIMES DAILY   GLIMEPIRIDE (AMARYL) 2 MG TABLET    Take 1 tablet (2 mg total) by mouth 2 (two) times daily.   HYDRALAZINE (APRESOLINE) 50 MG TABLET    Take 1 tablet (50 mg total) by mouth 3 (three) times daily.   HYDROCODONE-ACETAMINOPHEN (NORCO/VICODIN) 5-325 MG PER TABLET    Take 1 tablet by mouth every 6 (six) hours as needed. For pain   ISOSORBIDE MONONITRATE (IMDUR) 30 MG 24 HR TABLET    Take 1 tablet (30 mg total) by mouth daily.   METFORMIN (GLUCOPHAGE-XR) 500 MG 24 HR TABLET    Take 1,000 mg by mouth 2 (two) times daily.   MILK THISTLE PO    Take 1 capsule by mouth daily.   MIRABEGRON ER ([name]) 50 MG TB24    Take by mouth daily.   PANTOPRAZOLE (PROTONIX) 40 MG TABLET    TAKE 1 BY MOUTH DAILY   VITAMIN B-12 (CYANOCOBALAMIN) 1000 MCG TABLET    Take 1,000 mcg by mouth daily.  Modified Medications   No medications on file  Discontinued Medications   ALPRAZOLAM (XANAX) 0.5 MG TABLET    Take 1 tablet (0.5 mg total) by mouth once.   BENZONATATE (TESSALON) 200 MG CAPSULE    Take 1 capsule (200 mg total) by mouth 3 (three) times daily as needed for cough (swallow whole).   PROMETHAZINE (PHENERGAN) 25 MG TABLET    Take 1 tablet (25 mg total) by mouth every 8 (eight) hours as needed for nausea or vomiting.

## 2014-10-03 NOTE — Progress Notes (Signed)
Pre visit review using our clinic review tool, if applicable. No additional management support is needed unless otherwise documented below in the visit note. 

## 2014-10-27 ENCOUNTER — Other Ambulatory Visit: Payer: Self-pay | Admitting: Family Medicine

## 2014-10-27 MED ORDER — PANTOPRAZOLE SODIUM 40 MG PO TBEC: DELAYED_RELEASE_TABLET | ORAL | Status: DC

## 2014-10-27 MED ORDER — GLIMEPIRIDE 2 MG PO TABS: 2.0000 mg | ORAL_TABLET | Freq: Two times a day (BID) | ORAL | Status: DC

## 2014-10-27 NOTE — Telephone Encounter (Signed)
Received fax from [facility] [facility] saying pt is getting Rxs from them now and needs new Rx sent. done

## 2014-11-04 ENCOUNTER — Ambulatory Visit (INDEPENDENT_AMBULATORY_CARE_PROVIDER_SITE_OTHER): Payer: Medicare HMO | Admitting: Family Medicine

## 2014-11-04 ENCOUNTER — Encounter: Payer: Self-pay | Admitting: Family Medicine

## 2014-11-04 VITALS — BP 138/70 | HR 70 | Temp 98.2°F | Ht 62.0 in | Wt 149.0 lb

## 2014-11-04 DIAGNOSIS — R790 Abnormal level of blood mineral: Secondary | ICD-10-CM

## 2014-11-04 DIAGNOSIS — I429 Cardiomyopathy, unspecified: Secondary | ICD-10-CM | POA: Diagnosis not present

## 2014-11-04 DIAGNOSIS — I10 Essential (primary) hypertension: Secondary | ICD-10-CM | POA: Diagnosis not present

## 2014-11-04 DIAGNOSIS — I428 Other cardiomyopathies: Secondary | ICD-10-CM

## 2014-11-04 NOTE — Progress Notes (Signed)
Subjective:    Patient ID: Morgan Hubbard, female    DOB: [birth year], 80 y.o.   MRN: [id number]  HPI Here for f/u of low magnesium   hosp [facility] - had defibrillator/pacer put in (ICD medtronic biventricular)   Had some discomfort  She then had some hypotension and flushing   Her magnesium 1.4 - then given 4 gm of magnesium  She had it adjusted once A bit sore but doing ok overall   Is feeling much better  "can walk and talk at the same time"  Thinks it is really helping - more stamina / can do more and walk further   Patient Active Problem List   Diagnosis Date Noted  . Encounter for Medicare annual wellness exam 06/13/2014  . CAP (community acquired pneumonia) 06/06/2014  . Cough 05/31/2014  . Viral gastroenteritis 05/31/2014  . UTI (urinary tract infection) 03/01/2014  . Stroke, small vessel 01/18/2014  . Family history of hemochromatosis 01/18/2014  . Cerebral thrombosis with cerebral infarction 01/14/2014  . Expressive aphasia 01/13/2014  . Nonischemic cardiomyopathy 04/05/2013  . Shortness of breath 09/03/2012  . Gallstones 05/26/2012  . Elevated transaminase level 05/18/2012  . Spinal stenosis of lumbar region 12/10/2011  . Mixed incontinence urge and stress 12/10/2011  . Renal insufficiency 11/04/2011  . Goiter 01/14/2011  . Tongue swelling 12/01/2010  . Fatty liver 08/28/2010  . DM 07/09/2010  . HYPERCHOLESTEROLEMIA 07/09/2010  . HYPERKALEMIA 07/09/2010  . REFLEX SYMPATHETIC DYSTROPHY 07/09/2010  . POLYNEUROPATHY 07/09/2010  . Essential hypertension 07/09/2010  . CARDIOMYOPATHY 07/09/2010  . OVERACTIVE BLADDER 07/09/2010   Past Medical History  Diagnosis Date  . GERD (gastroesophageal reflux disease)   . Ulcer   . Hyperlipidemia   . Colon polyps   . Thyroid disease   . Shingles   . Urine incontinence   . Goiter past remote    treated with RI  . Other primary cardiomyopathies   . Hyperpotassemia   . Inflammatory and toxic neuropathy,  unspecified   . Reflex sympathetic dystrophy, unspecified   . Hypertonicity of bladder   . Hypertension   . Shortness of breath     ambulation  . Vertigo     hx of  . Overactive bladder   . LBBB (left bundle branch block)   . Cardiac LV ejection fraction >40%     "it was 43 last year" (01/13/2014)  . Type II diabetes mellitus   . Migraine     "stopped many years ago" (01/13/2014)  . Arthritis     "hands" (01/13/2014)  . Chronic lower back pain   . Basal cell carcinoma 01/2014    "bridge of nose"  . Expressive aphasia     "3 times in the last week" (01/13/2014)   Past Surgical History  Procedure Laterality Date  . Finger surgery Left [place]    "knot on index"  . Kidney donation Left 1989  . Lumbar laminectomy/decompression microdiscectomy  1999  . Ct scan  3/12    outside hosp- lung nodule and gallstones  . Upper gastrointestinal endoscopy    . Colonoscopy  7/12    normal (hx of polyps in past)   . Back surgery    . Cataract extraction w/ intraocular lens implant Left ~ 2005    several eye injections  . Lumbar laminectomy/decompression microdiscectomy  01/31/2012    Procedure: LUMBAR LAMINECTOMY/DECOMPRESSION MICRODISCECTOMY 2 LEVELS;  Surgeon: Eustace Moore, MD;  Location: [place] [facility];  Service: Neurosurgery;  Laterality: N/A;  Thoracic twelve-lumbar one, lumbar one-two laminectomy   . Tubal ligation  1976  . Breast cyst excision Left 1959  . Posterior laminectomy / decompression cervical spine  1999  . Cardiac catheterization  "several"    [place]   History  Substance Use Topics  . Smoking status: Never Smoker   . Smokeless tobacco: Never Used  . Alcohol Use: No   Family History  Problem Relation Age of Onset  . Arthritis Mother   . Cancer Mother     uterine and mouth  . Hyperlipidemia Mother   . Stroke Mother   . Hypertension Mother   . Alcohol abuse Father   . Diabetes Father   . Cancer Sister     breast  . Diabetes Sister   . Cancer Brother      lung cancer  . Kidney disease Brother   . Diabetes Brother   . Hyperlipidemia Sister   . Heart disease Sister   . Hypertension Sister   . Diabetes Sister   . Hyperlipidemia Brother   . Kidney failure Brother   . Diabetes Daughter    Allergies  Allergen Reactions  . Ace Inhibitors Swelling    REACTION: tongue swelling  . Codeine Other (See Comments)    REACTION: nausea  . Lisinopril Swelling    Swelling of tongue  . Nsaids   . Tylenol [Acetaminophen]    Current Outpatient Prescriptions on File Prior to Visit  Medication Sig Dispense Refill  . atorvastatin (LIPITOR) 80 MG tablet Take 80 mg by mouth daily.    . carvedilol (COREG) 12.5 MG tablet Take 1 tablet (12.5 mg total) by mouth 2 (two) times daily with a meal. 180 tablet 4  . clopidogrel (PLAVIX) 75 MG tablet Take 1 tablet (75 mg total) by mouth daily. 90 tablet 3  . gabapentin (NEURONTIN) 300 MG capsule TAKE 1 BY MOUTH 3 TIMES DAILY 270 capsule 3  . glimepiride (AMARYL) 2 MG tablet Take 1 tablet (2 mg total) by mouth 2 (two) times daily. 180 tablet 0  . hydrALAZINE (APRESOLINE) 50 MG tablet Take 1 tablet (50 mg total) by mouth 3 (three) times daily. (Patient taking differently: Take 50 mg by mouth 6 (six) times daily. ) 270 tablet 3  . HYDROcodone-acetaminophen (NORCO/VICODIN) 5-325 MG per tablet Take 1 tablet by mouth every 6 (six) hours as needed. For pain 30 tablet 0  . isosorbide mononitrate (IMDUR) 30 MG 24 hr tablet Take 1 tablet (30 mg total) by mouth daily. 90 tablet 4  . metFORMIN (GLUCOPHAGE-XR) 500 MG 24 hr tablet Take 1,000 mg by mouth 2 (two) times daily.    Marland Kitchen MILK THISTLE PO Take 1 capsule by mouth daily.    . mirabegron ER ([name]) 50 MG TB24 Take by mouth daily.    . pantoprazole (PROTONIX) 40 MG tablet TAKE 1 BY MOUTH DAILY 90 tablet 0  . vitamin B-12 (CYANOCOBALAMIN) 1000 MCG tablet Take 2,500 mcg by mouth daily.      No current facility-administered medications on file prior to visit.      Review of  Systems Review of Systems  Constitutional: Negative for fever, appetite change, fatigue and unexpected weight change.  Eyes: Negative for pain and visual disturbance.  Respiratory: Negative for cough and shortness of breath.   Cardiovascular: Negative for cp or palpitations  Neg for fatigue/dizziness or pedal edema   Gastrointestinal: Negative for nausea, diarrhea and constipation.  Genitourinary: Negative for urgency and frequency.  Skin: Negative for  pallor or rash  pos for mild soreness of chest s/p ICD placement  MSK neg for cramping  Neurological: Negative for weakness, light-headedness, numbness and headaches.  Hematological: Negative for adenopathy. Does not bruise/bleed easily.  Psychiatric/Behavioral: Negative for dysphoric mood. The patient is not nervous/anxious.         Objective:   Physical Exam  Constitutional: She appears well-developed and well-nourished. No distress.  overwt and well app  Eyes: Conjunctivae and EOM are normal. Pupils are equal, round, and reactive to light.  Neck: Normal range of motion. Neck supple.  Cardiovascular: Normal rate and regular rhythm.   Pulmonary/Chest: Effort normal and breath sounds normal. No respiratory distress. She has no wheezes. She has no rales.  Abdominal: Soft. Bowel sounds are normal.  Musculoskeletal: She exhibits no edema or tenderness.  Lymphadenopathy:    She has no cervical adenopathy.  Neurological: She is alert. She has normal reflexes.  Skin: Skin is warm and dry. No rash noted. No erythema.  Healing site of ICD in L chest wall noted  Psychiatric: She has a normal mood and affect.          Assessment & Plan:   Problem List Items Addressed This Visit    Essential hypertension - Primary    Stable/well controlled BP with new ICD  Doing well overall  Rev hospital records       Relevant Medications   aspirin 325 MG tablet   nitroGLYCERIN (NITROSTAT) 0.4 MG SL tablet   Low blood magnesium    Found in hosp  at [facility] and labs reviewed Was replaced  No symptoms  On PPI-which may be the cause   Lab today      Relevant Orders   Magnesium (Completed)   Nonischemic cardiomyopathy    Rev hospital records from [facility]- recent ICD placement and pt feels much better For f/u at [facility] soon        Relevant Medications   aspirin 325 MG tablet   nitroGLYCERIN (NITROSTAT) 0.4 MG SL tablet

## 2014-11-04 NOTE — Patient Instructions (Signed)
Take care you are feeling better  Magnesium level today  Follow up at [facility] as planned  Follow up in Feb for annual exam with labs prior

## 2014-11-04 NOTE — Progress Notes (Signed)
Pre visit review using our clinic review tool, if applicable. No additional management support is needed unless otherwise documented below in the visit note. 

## 2014-11-05 ENCOUNTER — Telehealth: Payer: Self-pay | Admitting: Family Medicine

## 2014-11-05 LAB — MAGNESIUM: MAGNESIUM: 1.3 mg/dL — AB (ref 1.5–2.5)

## 2014-11-05 MED ORDER — MAGNESIUM CHLORIDE 64 MG PO TBEC: 2.0000 | DELAYED_RELEASE_TABLET | Freq: Every day | ORAL | Status: DC

## 2014-11-05 NOTE — Telephone Encounter (Signed)
Please let pt know that her magnesium level is low - please call in slow mag to take 2 pills daily- can take with food If GI upset or diarrhea let me know  Re check magnesium level in 1-2 months please

## 2014-11-06 NOTE — Assessment & Plan Note (Signed)
Found in [place] at [facility] and labs reviewed Was replaced  No symptoms  On PPI-which may be the cause   Lab today

## 2014-11-06 NOTE — Assessment & Plan Note (Signed)
Rev hospital records from [place]- recent ICD placement and pt feels much better For f/u at [facility] soon

## 2014-11-06 NOTE — Assessment & Plan Note (Signed)
Stable/well controlled BP with new ICD  Doing well overall  Rev hospital records

## 2014-11-07 MED ORDER — MAGNESIUM CHLORIDE 64 MG PO TBEC: 2.0000 | DELAYED_RELEASE_TABLET | Freq: Every day | ORAL | Status: DC

## 2014-11-07 NOTE — Telephone Encounter (Signed)
Rx sent to pharmacy and pt notified and advised of Dr. Marliss Coots instructions and verbalized understanding. Lab appt scheduled

## 2014-11-15 ENCOUNTER — Encounter: Payer: Self-pay | Admitting: Family Medicine

## 2014-11-15 ENCOUNTER — Ambulatory Visit: Payer: Medicare HMO | Admitting: Internal Medicine

## 2014-11-15 ENCOUNTER — Ambulatory Visit (INDEPENDENT_AMBULATORY_CARE_PROVIDER_SITE_OTHER): Payer: Medicare HMO | Admitting: Family Medicine

## 2014-11-15 VITALS — BP 110/60 | HR 76 | Temp 97.9°F | Ht 62.0 in | Wt 147.2 lb

## 2014-11-15 DIAGNOSIS — R1013 Epigastric pain: Secondary | ICD-10-CM

## 2014-11-15 NOTE — Progress Notes (Signed)
   Subjective:    Patient ID: Morgan Hubbard, female    DOB: [birth year], 80 y.o.   MRN: [id number]  HPI   80 year old female with history of  DM, CVA, nonischemic cardiomyopathy, HTN  pt of Dr. Marliss Coots presents with new onset heartburn last night and new dizziness this AM.  Had defib placed 3 weeks ago.  She reports that  30 min after eating a blooming onion last night she had severe pain in epigastrium. No acid in throat. Was nauseous.  no exertional symptoms.  Took alka seltzer tablets, drank antacid.  Improved this morning additional alka seltzer. Symptoms are almost gone.  She had mild dizziness, no headache, no fall.  No slurred speech, no neuro chagnes.   She has has history of PUD, remote.  Has had one other similar epsiode 4-5 weeks ago.  She is on protonix 40 mg daily. Previously controlling GERD. Not on NSAIDS. Does take aspirin 325 mg daily.    BP Readings from Last 3 Encounters:  11/15/14 110/60  11/04/14 138/70  10/03/14 130/70        Review of Systems  Constitutional: Negative for fever and fatigue.  HENT: Negative for ear pain.   Eyes: Negative for pain.  Respiratory: Negative for cough, chest tightness and shortness of breath.   Cardiovascular: Negative for chest pain, palpitations and leg swelling.  Gastrointestinal: Positive for nausea and abdominal pain. Negative for diarrhea, constipation and blood in stool.  Genitourinary: Negative for dysuria.  Neurological: Positive for dizziness. Negative for syncope, facial asymmetry, weakness and headaches.       Objective:   Physical Exam  Constitutional: Vital signs are normal. She appears well-developed and well-nourished. She is cooperative.  Non-toxic appearance. She does not appear ill. No distress.  HENT:  Head: Normocephalic.  Right Ear: Hearing, tympanic membrane, external ear and ear canal normal. Tympanic membrane is not erythematous, not retracted and not bulging.  Left Ear: Hearing, tympanic  membrane, external ear and ear canal normal. Tympanic membrane is not erythematous, not retracted and not bulging.  Nose: No mucosal edema or rhinorrhea. Right sinus exhibits no maxillary sinus tenderness and no frontal sinus tenderness. Left sinus exhibits no maxillary sinus tenderness and no frontal sinus tenderness.  Mouth/Throat: Uvula is midline, oropharynx is clear and moist and mucous membranes are normal.  Eyes: Conjunctivae, EOM and lids are normal. Pupils are equal, round, and reactive to light. Lids are everted and swept, no foreign bodies found.  Neck: Trachea normal and normal range of motion. Neck supple. Carotid bruit is not present. No thyroid mass and no thyromegaly present.  Cardiovascular: Normal rate, regular rhythm, S1 normal, S2 normal, normal heart sounds, intact distal pulses and normal pulses.  Exam reveals no gallop and no friction rub.   No murmur heard. Pulmonary/Chest: Effort normal and breath sounds normal. No tachypnea. No respiratory distress. She has no decreased breath sounds. She has no wheezes. She has no rhonchi. She has no rales.  Abdominal: Soft. Normal appearance and bowel sounds are normal. There is no hepatosplenomegaly. There is tenderness in the epigastric area. There is no CVA tenderness.  Neurological: She is alert.  Skin: Skin is warm, dry and intact. No rash noted.  Psychiatric: Her speech is normal and behavior is normal. Judgment and thought content normal. Her mood appears not anxious. Cognition and memory are normal. She does not exhibit a depressed mood.          Assessment & Plan:

## 2014-11-15 NOTE — Progress Notes (Signed)
Pre visit review using our clinic review tool, if applicable. No additional management support is needed unless otherwise documented below in the visit note. 

## 2014-11-15 NOTE — Assessment & Plan Note (Addendum)
Likely secondary to gastritis and GERD. Obvious trigger presents and no red flags for cardiac issue.  No pain in RUQ or back to suggest gallstone vs pancreatitis respectively. Pain is almost gone now with use of alka seltzer.  Continue PPI, can add H2 blocker to use prn.  If continuing to have issue...  Recommend labs for Hpylori, pancreas , liver etc. and to consider ENDO to re-eval for ulcer

## 2014-11-15 NOTE — Patient Instructions (Addendum)
Avoid greasy foods, spicy, tomato, alcohol, caffeine, chocolate, onions, peppermint. Use zantac or pepcid AC (Generic rantidine) to treat symptoms. Continue Protonix daily. If continued symptoms.. Call for referral to GI for endoscopy.

## 2014-11-28 ENCOUNTER — Other Ambulatory Visit: Payer: Self-pay

## 2014-12-08 ENCOUNTER — Other Ambulatory Visit: Payer: Self-pay | Admitting: Family Medicine

## 2014-12-08 DIAGNOSIS — R790 Abnormal level of blood mineral: Secondary | ICD-10-CM

## 2014-12-14 ENCOUNTER — Other Ambulatory Visit: Payer: Medicare HMO

## 2014-12-16 ENCOUNTER — Other Ambulatory Visit (INDEPENDENT_AMBULATORY_CARE_PROVIDER_SITE_OTHER): Payer: Medicare HMO

## 2014-12-16 DIAGNOSIS — R790 Abnormal level of blood mineral: Secondary | ICD-10-CM

## 2014-12-16 LAB — MAGNESIUM: MAGNESIUM: 1.3 mg/dL — AB (ref 1.5–2.5)

## 2014-12-21 ENCOUNTER — Encounter: Payer: Self-pay | Admitting: Family Medicine

## 2014-12-23 ENCOUNTER — Telehealth: Payer: Self-pay | Admitting: Family Medicine

## 2014-12-23 MED ORDER — HYDROCODONE-ACETAMINOPHEN 5-325 MG PO TABS: 1.0000 | ORAL_TABLET | Freq: Four times a day (QID) | ORAL | Status: DC | PRN

## 2014-12-23 NOTE — Telephone Encounter (Signed)
Pt notified Rx ready for pickup 

## 2014-12-23 NOTE — Telephone Encounter (Signed)
Needs pain med refilled  Printed and in IN box  Please call pt for pick up

## 2014-12-27 ENCOUNTER — Ambulatory Visit: Payer: Medicare HMO | Admitting: Internal Medicine

## 2015-01-24 ENCOUNTER — Other Ambulatory Visit: Payer: Self-pay | Admitting: Cardiovascular Disease

## 2015-02-06 ENCOUNTER — Encounter: Payer: Self-pay | Admitting: Family Medicine

## 2015-02-07 ENCOUNTER — Telehealth: Payer: Self-pay | Admitting: Family Medicine

## 2015-02-07 MED ORDER — GABAPENTIN 300 MG PO CAPS: ORAL_CAPSULE | ORAL | Status: DC

## 2015-02-07 NOTE — Telephone Encounter (Signed)
Refill gabapentin 

## 2015-02-21 ENCOUNTER — Other Ambulatory Visit: Payer: Self-pay | Admitting: Family Medicine

## 2015-03-01 ENCOUNTER — Telehealth: Payer: Self-pay | Admitting: Family Medicine

## 2015-03-01 ENCOUNTER — Encounter: Payer: Self-pay | Admitting: Family Medicine

## 2015-03-01 MED ORDER — HYDROCODONE-ACETAMINOPHEN 5-325 MG PO TABS: 1.0000 | ORAL_TABLET | Freq: Four times a day (QID) | ORAL | Status: DC | PRN

## 2015-03-01 NOTE — Telephone Encounter (Signed)
Pain med printed for pick up -please let pt know

## 2015-03-01 NOTE — Telephone Encounter (Signed)
Pt notified Rx ready for pickup 

## 2015-03-10 ENCOUNTER — Encounter: Payer: Self-pay | Admitting: Family Medicine

## 2015-03-10 ENCOUNTER — Ambulatory Visit (INDEPENDENT_AMBULATORY_CARE_PROVIDER_SITE_OTHER): Payer: Medicare HMO | Admitting: Family Medicine

## 2015-03-10 VITALS — BP 150/70 | HR 85 | Temp 97.8°F | Ht 62.0 in | Wt 151.5 lb

## 2015-03-10 DIAGNOSIS — H6123 Impacted cerumen, bilateral: Secondary | ICD-10-CM | POA: Diagnosis not present

## 2015-03-10 DIAGNOSIS — H6120 Impacted cerumen, unspecified ear: Secondary | ICD-10-CM | POA: Insufficient documentation

## 2015-03-10 NOTE — Progress Notes (Signed)
Subjective:    Patient ID: Morgan Hubbard, female    DOB: 1935/06/02, 80 y.o.   MRN: [id number]  HPI Here with ear symptoms   Stopped up feeling  Used otc kit for wax-nothing came out   R ear is a little painful today   Hard to hear   No cold or allergy symptoms   Patient Active Problem List   Diagnosis Date Noted  . Cerumen impaction 03/10/2015  . Abdominal pain, epigastric 11/15/2014  . Low blood magnesium 11/04/2014  . Encounter for Medicare annual wellness exam 06/13/2014  . CAP (community acquired pneumonia) 06/06/2014  . Cough 05/31/2014  . Viral gastroenteritis 05/31/2014  . UTI (urinary tract infection) 03/01/2014  . Stroke, small vessel ([place]) 01/18/2014  . Family history of hemochromatosis 01/18/2014  . Cerebral thrombosis with cerebral infarction ([place]) 01/14/2014  . Expressive aphasia 01/13/2014  . Nonischemic cardiomyopathy ([place]) 04/05/2013  . Shortness of breath 09/03/2012  . Gallstones 05/26/2012  . Elevated transaminase level 05/18/2012  . Spinal stenosis of lumbar region 12/10/2011  . Mixed incontinence urge and stress 12/10/2011  . Renal insufficiency 11/04/2011  . Goiter 01/14/2011  . Tongue swelling 12/01/2010  . Fatty liver 08/28/2010  . DM 07/09/2010  . HYPERCHOLESTEROLEMIA 07/09/2010  . HYPERKALEMIA 07/09/2010  . REFLEX SYMPATHETIC DYSTROPHY 07/09/2010  . POLYNEUROPATHY 07/09/2010  . Essential hypertension 07/09/2010  . CARDIOMYOPATHY 07/09/2010  . OVERACTIVE BLADDER 07/09/2010   Past Medical History  Diagnosis Date  . GERD (gastroesophageal reflux disease)   . Ulcer   . Hyperlipidemia   . Colon polyps   . Thyroid disease   . Shingles   . Urine incontinence   . Goiter past remote    treated with RI  . Other primary cardiomyopathies   . Hyperpotassemia   . Inflammatory and toxic neuropathy, unspecified   . Reflex sympathetic dystrophy, unspecified   . Hypertonicity of bladder   . Hypertension   . Shortness of breath     ambulation   . Vertigo     hx of  . Overactive bladder   . LBBB (left bundle branch block)   . Cardiac LV ejection fraction >40%     "it was 43 last year" (01/13/2014)  . Type II diabetes mellitus ([place])   . Migraine     "stopped many years ago" (01/13/2014)  . Arthritis     "hands" (01/13/2014)  . Chronic lower back pain   . Basal cell carcinoma 01/2014    "bridge of nose"  . Expressive aphasia     "3 times in the last week" (01/13/2014)   Past Surgical History  Procedure Laterality Date  . Finger surgery Left [place]    "knot on index"  . Kidney donation Left 1989  . Lumbar laminectomy/decompression microdiscectomy  1999  . Ct scan  3/12    outside hosp- lung nodule and gallstones  . Upper gastrointestinal endoscopy    . Colonoscopy  7/12    normal (hx of polyps in past)   . Back surgery    . Cataract extraction w/ intraocular lens implant Left ~ 2005    several eye injections  . Lumbar laminectomy/decompression microdiscectomy  01/31/2012    Procedure: LUMBAR LAMINECTOMY/DECOMPRESSION MICRODISCECTOMY 2 LEVELS;  Surgeon: Eustace Moore, MD;  Location: [facility];  Service: Neurosurgery;  Laterality: N/A;  Thoracic twelve-lumbar one, lumbar one-two laminectomy   . Tubal ligation  1976  . Breast cyst excision Left 1959  . Posterior laminectomy / decompression  cervical spine  1999  . Cardiac catheterization  "several"    [place] History  Substance Use Topics  . Smoking status: Never Smoker   . Smokeless tobacco: Never Used  . Alcohol Use: No   Family History  Problem Relation Age of Onset  . Arthritis Mother   . Cancer Mother     uterine and mouth  . Hyperlipidemia Mother   . Stroke Mother   . Hypertension Mother   . Alcohol abuse Father   . Diabetes Father   . Cancer Sister     breast  . Diabetes Sister   . Cancer Brother     lung cancer  . Kidney disease Brother   . Diabetes Brother   . Hyperlipidemia Sister   . Heart disease Sister   . Hypertension  Sister   . Diabetes Sister   . Hyperlipidemia Brother   . Kidney failure Brother   . Diabetes Daughter    Allergies  Allergen Reactions  . Ace Inhibitors Swelling    REACTION: tongue swelling  . Codeine Other (See Comments)    REACTION: nausea  . Lisinopril Swelling    Swelling of tongue  . Nsaids   . Tylenol [Acetaminophen]    Current Outpatient Prescriptions on File Prior to Visit  Medication Sig Dispense Refill  . aspirin 325 MG tablet Take 325 mg by mouth daily.    Marland Kitchen atorvastatin (LIPITOR) 80 MG tablet Take 80 mg by mouth daily.    . carvedilol (COREG) 12.5 MG tablet Take 1 tablet (12.5 mg total) by mouth 2 (two) times daily with a meal. 180 tablet 4  . Cholecalciferol 1000 UNITS tablet Take 1,000 Units by mouth daily.    Marland Kitchen gabapentin (NEURONTIN) 300 MG capsule TAKE 1 BY MOUTH 3 TIMES DAILY 270 capsule 3  . glimepiride (AMARYL) 2 MG tablet Take 1 tablet (2 mg total) by mouth 2 (two) times daily. 180 tablet 0  . hydrALAZINE (APRESOLINE) 50 MG tablet Take 1 tablet (50 mg total) by mouth 3 (three) times daily. (Patient taking differently: Take 50 mg by mouth 6 (six) times daily. ) 270 tablet 3  . HYDROcodone-acetaminophen (NORCO/VICODIN) 5-325 MG tablet Take 1 tablet by mouth every 6 (six) hours as needed. For pain 30 tablet 0  . isosorbide mononitrate (IMDUR) 30 MG 24 hr tablet Take 1 tablet (30 mg total) by mouth daily. 90 tablet 4  . losartan-hydrochlorothiazide (HYZAAR) 50-12.5 MG per tablet Take one-half tab once daily in the mornings.    . magnesium chloride (SLOW-MAG) 64 MG TBEC SR tablet Take 2 tablets (128 mg total) by mouth daily. 60 tablet 5  . metFORMIN (GLUCOPHAGE-XR) 500 MG 24 hr tablet Take 1,000 mg by mouth 2 (two) times daily.    Marland Kitchen MILK THISTLE PO Take 1 capsule by mouth daily.    . mirabegron ER ([name]) 50 MG TB24 Take by mouth daily.    . nitroGLYCERIN (NITROSTAT) 0.4 MG SL tablet Place 0.4 mg under the tongue every 5 (five) minutes as needed for chest pain.     . pantoprazole (PROTONIX) 40 MG tablet TAKE ONE TABLET BY MOUTH ONCE DAILY 90 tablet 3  . vitamin B-12 (CYANOCOBALAMIN) 1000 MCG tablet Take 2,500 mcg by mouth daily.      No current facility-administered medications on file prior to visit.    Review of Systems  Review of Systems  Constitutional: Negative for fever, appetite change, fatigue and unexpected weight change.  ENT pos for dec hearing/  neg for facial pain or congestion or vertigo  Eyes: Negative for pain and visual disturbance.  Respiratory: Negative for cough and shortness of breath.   Cardiovascular: Negative for cp or palpitations    Gastrointestinal: Negative for nausea, diarrhea and constipation.  Genitourinary: Negative for urgency and frequency.  Skin: Negative for pallor or rash   Neurological: Negative for weakness, light-headedness, numbness and headaches.  Hematological: Negative for adenopathy. Does not bruise/bleed easily.  Psychiatric/Behavioral: Negative for dysphoric mood. The patient is not nervous/anxious.         Objective:   Physical Exam  Constitutional: She appears well-developed and well-nourished. No distress.  HENT:  Head: Normocephalic and atraumatic.  Mouth/Throat: Oropharynx is clear and moist.  Bilateral cerumen impaction - worse on the L   S/p simple irritation-both sides clear with nl app TM and canals are slt irritated   Hearing much improved   Eyes: Conjunctivae and EOM are normal. Pupils are equal, round, and reactive to light. Right eye exhibits no discharge. Left eye exhibits no discharge. No scleral icterus.  Neck: Normal range of motion. Neck supple.  Cardiovascular: Normal rate and regular rhythm.   Lymphadenopathy:    She has no cervical adenopathy.  Neurological: She is alert. She has normal reflexes. No cranial nerve deficit.  Skin: Skin is warm and dry. No rash noted. No erythema. No pallor.  Psychiatric: She has a normal mood and affect.          Assessment & Plan:     Problem List Items Addressed This Visit      Nervous and Auditory   Cerumen impaction - Primary    Bilateral - Left worse than R with loss of hearing  Cleared with simple ear irrigation today  Pt improved and could hear better after that  Will watch for ear pain/dizziness/ etc and update  Enc her to try using wax softening drops perhaps monthly to prevent problems

## 2015-03-10 NOTE — Progress Notes (Signed)
Pre visit review using our clinic review tool, if applicable. No additional management support is needed unless otherwise documented below in the visit note. 

## 2015-03-10 NOTE — Patient Instructions (Signed)
Ears were flushed out today  Your hearing should continue to improve as the water dries up  If any pain or other symptoms please let us know

## 2015-03-10 NOTE — Assessment & Plan Note (Signed)
Bilateral - Left worse than R with loss of hearing  Cleared with simple ear irrigation today  Pt improved and could hear better after that  Will watch for ear pain/dizziness/ etc and update  Enc her to try using wax softening drops perhaps monthly to prevent problems

## 2015-03-31 DIAGNOSIS — Z85828 Personal history of other malignant neoplasm of skin: Secondary | ICD-10-CM | POA: Diagnosis not present

## 2015-03-31 DIAGNOSIS — L538 Other specified erythematous conditions: Secondary | ICD-10-CM | POA: Diagnosis not present

## 2015-03-31 DIAGNOSIS — L820 Inflamed seborrheic keratosis: Secondary | ICD-10-CM | POA: Diagnosis not present

## 2015-03-31 DIAGNOSIS — L570 Actinic keratosis: Secondary | ICD-10-CM | POA: Diagnosis not present

## 2015-03-31 DIAGNOSIS — X32XXXA Exposure to sunlight, initial encounter: Secondary | ICD-10-CM | POA: Diagnosis not present

## 2015-03-31 DIAGNOSIS — L728 Other follicular cysts of the skin and subcutaneous tissue: Secondary | ICD-10-CM | POA: Diagnosis not present

## 2015-03-31 DIAGNOSIS — L821 Other seborrheic keratosis: Secondary | ICD-10-CM | POA: Diagnosis not present

## 2015-04-07 DIAGNOSIS — R69 Illness, unspecified: Secondary | ICD-10-CM | POA: Diagnosis not present

## 2015-04-07 DIAGNOSIS — Z95810 Presence of automatic (implantable) cardiac defibrillator: Secondary | ICD-10-CM | POA: Diagnosis not present

## 2015-04-23 ENCOUNTER — Other Ambulatory Visit: Payer: Self-pay | Admitting: Family Medicine

## 2015-04-23 ENCOUNTER — Encounter: Payer: Self-pay | Admitting: Family Medicine

## 2015-04-24 ENCOUNTER — Telehealth: Payer: Self-pay | Admitting: Family Medicine

## 2015-04-24 ENCOUNTER — Encounter: Payer: Self-pay | Admitting: Family Medicine

## 2015-04-24 MED ORDER — HYDROCODONE-ACETAMINOPHEN 5-325 MG PO TABS: 1.0000 | ORAL_TABLET | Freq: Four times a day (QID) | ORAL | Status: DC | PRN

## 2015-04-24 NOTE — Telephone Encounter (Signed)
vicodin refill  Printed for pick up  Please let pt know it is done

## 2015-04-24 NOTE — Telephone Encounter (Signed)
Pt notified Rx ready for pickup 

## 2015-04-25 ENCOUNTER — Other Ambulatory Visit: Payer: Medicare HMO

## 2015-05-16 ENCOUNTER — Encounter: Payer: Self-pay | Admitting: Family Medicine

## 2015-05-23 DIAGNOSIS — X32XXXA Exposure to sunlight, initial encounter: Secondary | ICD-10-CM | POA: Diagnosis not present

## 2015-05-23 DIAGNOSIS — D485 Neoplasm of uncertain behavior of skin: Secondary | ICD-10-CM | POA: Diagnosis not present

## 2015-05-24 ENCOUNTER — Ambulatory Visit (INDEPENDENT_AMBULATORY_CARE_PROVIDER_SITE_OTHER): Payer: Medicare HMO

## 2015-05-24 DIAGNOSIS — Z23 Encounter for immunization: Secondary | ICD-10-CM | POA: Diagnosis not present

## 2015-06-21 ENCOUNTER — Encounter: Payer: Self-pay | Admitting: Cardiovascular Disease

## 2015-06-21 ENCOUNTER — Ambulatory Visit (INDEPENDENT_AMBULATORY_CARE_PROVIDER_SITE_OTHER): Payer: Medicare HMO | Admitting: Cardiovascular Disease

## 2015-06-21 VITALS — BP 158/70 | HR 67 | Ht 62.0 in | Wt 153.5 lb

## 2015-06-21 DIAGNOSIS — E7800 Pure hypercholesterolemia, unspecified: Secondary | ICD-10-CM | POA: Diagnosis not present

## 2015-06-21 DIAGNOSIS — R0602 Shortness of breath: Secondary | ICD-10-CM

## 2015-06-21 DIAGNOSIS — E1165 Type 2 diabetes mellitus with hyperglycemia: Secondary | ICD-10-CM | POA: Insufficient documentation

## 2015-06-21 DIAGNOSIS — I10 Essential (primary) hypertension: Secondary | ICD-10-CM | POA: Diagnosis not present

## 2015-06-21 DIAGNOSIS — I429 Cardiomyopathy, unspecified: Secondary | ICD-10-CM

## 2015-06-21 DIAGNOSIS — I428 Other cardiomyopathies: Secondary | ICD-10-CM

## 2015-06-21 DIAGNOSIS — I6350 Cerebral infarction due to unspecified occlusion or stenosis of unspecified cerebral artery: Secondary | ICD-10-CM

## 2015-06-21 NOTE — Assessment & Plan Note (Signed)
Ejection fraction 35% August 2016  Currently on a good medication regimen, we did discuss possibly changing losartan to entresto.  Recommended she discuss this with her other physicians such as nephrology , endocrinology,  Etc. At [facility]

## 2015-06-21 NOTE — Progress Notes (Signed)
Patient ID: Morgan Hubbard, female    DOB: [birth year], 81 y.o.   MRN: [id number]  HPI Comments: [name] is a very pleasant 81 year old woman with past medical history of nonischemic cardiomyopathy, prior ejection fraction of [id number] in 81 of uncertain etiology ,  history of left bundle branch block, nephrectomy/donated kidney in 1989, possible angioedema on lisinopril, back surgery x3, poorly controlled diabetes  Hemoglobin A1c 8, previously seen for shortness of breath. She presents for routine followup  Of her cardiomyopathy   last seen March 2015  Since that time, she reports having TIA/stroke August 2015.  haddifficulty speaking,  Walking  Initially placed on aspirin, Plavix, been changed by neurology to aspirin 325 mg daily , Plavix held  she has been seen at [facility]  By cardiology and EP  Biventricular ICD placed May 2016 for shortness of breath symptoms.   Dr. Beather Arbour  She reports weight has been stable,  She does not take Lasix  Secondary to overactive bladder   echocardiogram from August 2016 reviewed with her [facility] showing ejection fraction 35% , no significant valve disease   She does report having some stomach irritation,  Reports prior history of ulcer.  Review of lab work with her shows hemoglobin A1c 8.1, she is managed by endocrinology  Previously had low magnesium 1.3, was taking supplement, no longer taking supplement as this ran out.  Total cholesterol general 2016 was 86 , takes Lipitor 80 mg daily   No regular exercise program , travels, goes to the beach , owns a house there   other past medical history   History ofsevere back disease she has significant neuropathy in her legs.   Echocardiogram was done for her fatigue and shortness of breath.  ejection fraction of 25-30% which was significant drop from prior ejection fraction February 2013 (45%) done at [facility].  normal RVSP.  Prior cardiac catheterization 08/30/2010 in [place]  showed no  significant coronary artery disease   Allergies  Allergen Reactions  . Ace Inhibitors Swelling    REACTION: tongue swelling  . Codeine Other (See Comments)    REACTION: nausea  . Lisinopril Swelling    Swelling of tongue  . Nsaids   . Tylenol [Acetaminophen]     Current Outpatient Prescriptions on File Prior to Visit  Medication Sig Dispense Refill  . aspirin 325 MG tablet Take 325 mg by mouth daily.    Marland Kitchen atorvastatin (LIPITOR) 80 MG tablet Take 80 mg by mouth daily.    . carvedilol (COREG) 12.5 MG tablet Take 1 tablet (12.5 mg total) by mouth 2 (two) times daily with a meal. 180 tablet 4  . Cholecalciferol 1000 UNITS tablet Take 1,000 Units by mouth daily.    Marland Kitchen gabapentin (NEURONTIN) 300 MG capsule TAKE 1 BY MOUTH 3 TIMES DAILY 270 capsule 3  . hydrALAZINE (APRESOLINE) 50 MG tablet Take 1 tablet (50 mg total) by mouth 3 (three) times daily. (Patient taking differently: Take 50 mg by mouth 6 (six) times daily. ) 270 tablet 3  . HYDROcodone-acetaminophen (NORCO/VICODIN) 5-325 MG tablet Take 1 tablet by mouth every 6 (six) hours as needed for severe pain. For pain 30 tablet 0  . isosorbide mononitrate (IMDUR) 30 MG 24 hr tablet Take 1 tablet (30 mg total) by mouth daily. 90 tablet 4  . losartan-hydrochlorothiazide (HYZAAR) 50-12.5 MG per tablet Take one-half tab once daily in the mornings.    . magnesium chloride (SLOW-MAG) 64 MG TBEC SR tablet Take  2 tablets (128 mg total) by mouth daily. 60 tablet 5  . metFORMIN (GLUCOPHAGE-XR) 500 MG 24 hr tablet Take 1,000 mg by mouth 2 (two) times daily.    Marland Kitchen MILK THISTLE PO Take 1 capsule by mouth daily.    . mirabegron ER ([name]) 50 MG TB24 Take by mouth daily.    . nitroGLYCERIN (NITROSTAT) 0.4 MG SL tablet Place 0.4 mg under the tongue every 5 (five) minutes as needed for chest pain.    . pantoprazole (PROTONIX) 40 MG tablet TAKE ONE TABLET BY MOUTH ONCE DAILY 90 tablet 3  . vitamin B-12 (CYANOCOBALAMIN) 1000 MCG tablet Take 2,500 mcg by  mouth daily.      No current facility-administered medications on file prior to visit.    Past Medical History  Diagnosis Date  . GERD (gastroesophageal reflux disease)   . Ulcer   . Hyperlipidemia   . Colon polyps   . Thyroid disease   . Shingles   . Urine incontinence   . Goiter past remote    treated with RI  . Other primary cardiomyopathies   . Hyperpotassemia   . Inflammatory and toxic neuropathy, unspecified   . Reflex sympathetic dystrophy, unspecified   . Hypertonicity of bladder   . Hypertension   . Shortness of breath     ambulation  . Vertigo     hx of  . Overactive bladder   . LBBB (left bundle branch block)   . Cardiac LV ejection fraction >40%     "it was 43 last year" (01/13/2014)  . Type II diabetes mellitus ([place])   . Migraine     "stopped many years ago" (01/13/2014)  . Arthritis     "hands" (01/13/2014)  . Chronic lower back pain   . Basal cell carcinoma 01/2014    "bridge of nose"  . Expressive aphasia     "3 times in the last week" (01/13/2014)  . Mini stroke ([place]) 01/2014    Past Surgical History  Procedure Laterality Date  . Finger surgery Left [place]    "knot on index"  . Kidney donation Left 1989  . Lumbar laminectomy/decompression microdiscectomy  1999  . Ct scan  3/12    outside hosp- lung nodule and gallstones  . Upper gastrointestinal endoscopy    . Colonoscopy  7/12    normal (hx of polyps in past)   . Back surgery    . Cataract extraction w/ intraocular lens implant Left ~ 2005    several eye injections  . Lumbar laminectomy/decompression microdiscectomy  01/31/2012    Procedure: LUMBAR LAMINECTOMY/DECOMPRESSION MICRODISCECTOMY 2 LEVELS;  Surgeon: Eustace Moore, MD;  Location: [place] [facility];  Service: Neurosurgery;  Laterality: N/A;  Thoracic twelve-lumbar one, lumbar one-two laminectomy   . Tubal ligation  1976  . Breast cyst excision Left 1959  . Posterior laminectomy / decompression cervical spine  1999  . Cardiac  catheterization  "several"    [place]  . Bi-ventricular pacemaker insertion (crt-p)  10/2014    [facility]    Social History  reports that she has never smoked. She has never used smokeless tobacco. She reports that she does not drink alcohol or use illicit drugs.  Family History family history includes Alcohol abuse in her father; Arthritis in her mother; Cancer in her brother, mother, and sister; Diabetes in her brother, daughter, father, sister, and sister; Heart disease in her sister; Hyperlipidemia in her brother, mother, and sister; Hypertension in her mother and sister; Kidney  disease in her brother; Kidney failure in her brother; Stroke in her mother.      Review of Systems  Constitutional: Negative.   Cardiovascular: Negative.   Gastrointestinal: Negative.   Musculoskeletal: Positive for arthralgias.  Neurological: Negative.   Hematological: Negative.   Psychiatric/Behavioral: Negative.   All other systems reviewed and are negative.   BP 158/70 mmHg  Pulse 67  Ht 5\' 2"  (1.575 m)  Wt 153 lb 8 oz (69.627 kg)  BMI 28.07 kg/m2  LMP 06/03/1992  Physical Exam  Constitutional: She is oriented to person, place, and time. She appears well-developed and well-nourished.  HENT:  Head: Normocephalic.  Nose: Nose normal.  Mouth/Throat: Oropharynx is clear and moist.  Eyes: Conjunctivae are normal. Pupils are equal, round, and reactive to light.  Neck: Normal range of motion. Neck supple. No JVD present. Carotid bruit is present.  Cardiovascular: Normal rate, regular rhythm, S1 normal, S2 normal, normal heart sounds and intact distal pulses.  Exam reveals no gallop and no friction rub.   No murmur heard. Pulmonary/Chest: Effort normal and breath sounds normal. No respiratory distress. She has no wheezes. She has no rales. She exhibits no tenderness.  Abdominal: Soft. Bowel sounds are normal. She exhibits no distension. There is no tenderness.  Musculoskeletal: Normal range of  motion. She exhibits no edema or tenderness.  Lymphadenopathy:    She has no cervical adenopathy.  Neurological: She is alert and oriented to person, place, and time. Coordination normal.  Skin: Skin is warm and dry. No rash noted. No erythema.  Psychiatric: She has a normal mood and affect. Her behavior is normal. Judgment and thought content normal.    Assessment and Plan  Nursing note and vitals reviewed.

## 2015-06-21 NOTE — Assessment & Plan Note (Signed)
Periodically seen by neurology,  Told to take aspirin 325 mg daily.  She reports having some stomach irritation, previous history of stomach ulcer?  Suggested she mention this to neurology. Was previously on aspirin and Plavix.  If a nonresponder to Plavix, could potentially  Take aspirin 81 mg daily with brilinta  Twice a day

## 2015-06-21 NOTE — Patient Instructions (Signed)
You are doing well. No medication changes were made.  Ask Dr. Leonides Schanz and Beather Arbour about stopping losartan and starting ENTRESTO  Please call us if you have new issues that need to be addressed before your next appt.  Your physician wants you to follow-up in: 6 months.  You will receive a reminder letter in the mail two months in advance. If you don't receive a letter, please call our office to schedule the follow-up appointment.

## 2015-06-21 NOTE — Assessment & Plan Note (Signed)
Hemoglobin A1c of 8, managed by endocrinology We have encouraged continued exercise, careful diet management in an effort to lose weight.

## 2015-06-21 NOTE — Assessment & Plan Note (Signed)
Recommended repeat lipid panel  Prior total cholesterol in January 2016 was 86   may be able to decrease Lipitor down to 40 mg

## 2015-06-21 NOTE — Assessment & Plan Note (Signed)
Blood pressure mildly elevated. She reports it is better at home on a regular basis.  She is indicated that neurology prefers higher blood pressure  We have discussed with her the challenge with blood pressure management, typically  Cardiology will prefer lower blood pressures in the setting of cardiomyopathy

## 2015-06-21 NOTE — Assessment & Plan Note (Signed)
Currently appears relatively euvolemic, does not take diuretics  Symptoms improved with biventricular ICD

## 2015-06-26 ENCOUNTER — Other Ambulatory Visit: Payer: Self-pay

## 2015-06-26 ENCOUNTER — Telehealth: Payer: Self-pay | Admitting: *Deleted

## 2015-06-26 MED ORDER — HYDROCODONE-ACETAMINOPHEN 5-325 MG PO TABS: 1.0000 | ORAL_TABLET | Freq: Four times a day (QID) | ORAL | Status: DC | PRN

## 2015-06-26 NOTE — Telephone Encounter (Signed)
Px printed for pick up in IN box  

## 2015-06-26 NOTE — Telephone Encounter (Signed)
Pt left v/m requesting rx hydrocodone apap. Call when ready for pick up. rx last printed # 30 on 04/24/15. Last f/u appt 11/04/14. Pt would like to pick up 06/29/15; pt is out of town now.

## 2015-06-26 NOTE — Telephone Encounter (Signed)
Pt calling stating that Dr Rockey Situ is to tell Dr [name] office what kinds of labs to draw on patient. Please advise.

## 2015-06-27 NOTE — Telephone Encounter (Signed)
Left voicemail letting pt know Rx ready for pick up 

## 2015-06-29 ENCOUNTER — Telehealth: Payer: Self-pay

## 2015-06-29 NOTE — Telephone Encounter (Signed)
Pt called inquiring about Lab orders that were to be sent to her PCP, potassium, magnesium and cholesterol. Please call.

## 2015-06-29 NOTE — Telephone Encounter (Signed)
Spoke w/ pt.  She states that she has called her PCPs office and attempted to get labs drawn, but there seems to be some confusion.   Advised her that Dr. Rockey Situ sent a note in pt's office note, perhaps it was not read yet:  Routing History     From: Morgan Merritts, MD On: 06/21/2015 1:07 PM    To: Morgan Greenspan, MD    Priority: Routine    Routing Comments:     I had the opportunity to see Morgan Hubbard. I offered to do blood work in the office here in [place], she preferred to do blood work through [employer]. She has stopped taking her magnesium supplement, previously level was 1.3. She ran out. Also may be able to reduce Lipitor if cholesterol continues to run low as it did January 2016. She is hoping you might be able to check fasting lipid panel. She is also concerned about potassium level, previously was elevated. thx Esmond Plants          She will call PCP's office to see if this has been clarified and call back if we can be of further assistance.

## 2015-06-30 DIAGNOSIS — Z7984 Long term (current) use of oral hypoglycemic drugs: Secondary | ICD-10-CM | POA: Diagnosis not present

## 2015-06-30 DIAGNOSIS — Z95810 Presence of automatic (implantable) cardiac defibrillator: Secondary | ICD-10-CM | POA: Diagnosis not present

## 2015-06-30 DIAGNOSIS — I4581 Long QT syndrome: Secondary | ICD-10-CM | POA: Diagnosis not present

## 2015-06-30 DIAGNOSIS — Z7982 Long term (current) use of aspirin: Secondary | ICD-10-CM | POA: Diagnosis not present

## 2015-06-30 DIAGNOSIS — Z4502 Encounter for adjustment and management of automatic implantable cardiac defibrillator: Secondary | ICD-10-CM | POA: Diagnosis not present

## 2015-06-30 DIAGNOSIS — Z79899 Other long term (current) drug therapy: Secondary | ICD-10-CM | POA: Diagnosis not present

## 2015-06-30 DIAGNOSIS — I447 Left bundle-branch block, unspecified: Secondary | ICD-10-CM | POA: Diagnosis not present

## 2015-06-30 DIAGNOSIS — I498 Other specified cardiac arrhythmias: Secondary | ICD-10-CM | POA: Diagnosis not present

## 2015-07-19 DIAGNOSIS — I10 Essential (primary) hypertension: Secondary | ICD-10-CM | POA: Diagnosis not present

## 2015-07-19 DIAGNOSIS — Z95810 Presence of automatic (implantable) cardiac defibrillator: Secondary | ICD-10-CM | POA: Diagnosis not present

## 2015-07-19 DIAGNOSIS — I428 Other cardiomyopathies: Secondary | ICD-10-CM | POA: Diagnosis not present

## 2015-07-19 DIAGNOSIS — R0789 Other chest pain: Secondary | ICD-10-CM | POA: Diagnosis not present

## 2015-08-17 DIAGNOSIS — Z1231 Encounter for screening mammogram for malignant neoplasm of breast: Secondary | ICD-10-CM | POA: Diagnosis not present

## 2015-08-28 ENCOUNTER — Ambulatory Visit (INDEPENDENT_AMBULATORY_CARE_PROVIDER_SITE_OTHER): Payer: Medicare HMO | Admitting: Family Medicine

## 2015-08-28 ENCOUNTER — Encounter: Payer: Self-pay | Admitting: Family Medicine

## 2015-08-28 VITALS — BP 162/92 | HR 83 | Temp 98.1°F | Ht 62.0 in | Wt 154.5 lb

## 2015-08-28 DIAGNOSIS — J029 Acute pharyngitis, unspecified: Secondary | ICD-10-CM | POA: Diagnosis not present

## 2015-08-28 DIAGNOSIS — J020 Streptococcal pharyngitis: Secondary | ICD-10-CM | POA: Diagnosis not present

## 2015-08-28 LAB — POCT RAPID STREP A (OFFICE): Rapid Strep A Screen: NEGATIVE

## 2015-08-28 NOTE — Assessment & Plan Note (Addendum)
No acute problem. Low likelihood of strep pharyngitis. Rapid strep negative today. Sending culture. Advised supportive care. Advised tylenol for pain, salt water gargles, chloraseptic spray, etc.

## 2015-08-28 NOTE — Patient Instructions (Signed)

## 2015-08-28 NOTE — Progress Notes (Signed)
   Subjective:  Patient ID: Morgan Hubbard, female    DOB: 11/05/1934  Age: 81 y.o. MRN: [id number]  CC: Sore throat  HPI:  81 year old female presents to clinic today with the above complaint.  Sore throat  Started yesterday.  Moderate in severity.  No interventions tried.  Makes swallowing difficult.  No exacerbating or relieving factors.  Also reports some ear pain.  No fever, chills.  Social Hx   Social History   Social History  . Marital Status: Married    Spouse Name: N/A  . Number of Children: 2  . Years of Education: N/A   Occupational History  . retired - Production manager     Social History Main Topics  . Smoking status: Never Smoker   . Smokeless tobacco: Never Used  . Alcohol Use: No  . Drug Use: No  . Sexual Activity: Yes   Other Topics Concern  . None   Social History Narrative   Married 2nd time   Daughter is Surveyor, minerals    Review of Systems  Constitutional: Negative for fever and chills.  HENT: Positive for ear pain and sore throat.    Objective:  BP 162/92 mmHg  Pulse 83  Temp(Src) 98.1 F (36.7 C) (Oral)  Ht 5\' 2"  (1.575 m)  Wt 154 lb 8 oz (70.081 kg)  BMI 28.25 kg/m2  SpO2 93%  LMP 06/03/1992  BP/Weight 08/28/2015 06/21/2015 [id number]  Systolic BP 0000000 0000000 [id number]  Diastolic BP 92 70 70  Wt. (Lbs) 154.5 153.5 151.5  BMI 28.25 28.07 27.7   Physical Exam  Constitutional: She is oriented to person, place, and time. She appears well-developed. No distress.  HENT:  Oropharynx with mild erythema.   Eyes: Conjunctivae are normal.  Lymphadenopathy:    She has no cervical adenopathy.  Neurological: She is alert and oriented to person, place, and time.  Psychiatric: She has a normal mood and affect.  Vitals reviewed.  Lab Results  Component Value Date   WBC 9.9 06/06/2014   HGB 12.8 06/06/2014   HCT 39.3 06/06/2014   PLT 304.0 06/06/2014   GLUCOSE 113* 06/06/2014   CHOL 86 06/06/2014   TRIG 114.0 06/06/2014   HDL 24.00*  06/06/2014   LDLDIRECT 151.3 03/02/2013   LDLCALC 39 06/06/2014   ALT 20 06/06/2014   AST 28 06/06/2014   NA 143 06/06/2014   K 4.7 07/05/2014   CL 102 06/06/2014   CREATININE 0.8 06/06/2014   BUN 11 06/06/2014   CO2 32 06/06/2014   TSH 0.79 06/06/2014   INR 0.98 01/13/2014   HGBA1C 8.9* 01/14/2014    Assessment & Plan:   Problem List Items Addressed This Visit    Acute pharyngitis - Primary    No acute problem. Low likelihood of strep pharyngitis. Rapid strep negative today. Sending culture. Advised supportive care. Advised tylenol for pain, salt water gargles, chloraseptic spray, etc.      Relevant Orders   POCT rapid strep A (Completed)   Culture, Group A Strep     Follow-up: PRN  [place]

## 2015-08-28 NOTE — Progress Notes (Signed)
Pre visit review using our clinic review tool, if applicable. No additional management support is needed unless otherwise documented below in the visit note. 

## 2015-08-29 DIAGNOSIS — N63 Unspecified lump in breast: Secondary | ICD-10-CM | POA: Diagnosis not present

## 2015-08-29 DIAGNOSIS — C50911 Malignant neoplasm of unspecified site of right female breast: Secondary | ICD-10-CM | POA: Diagnosis not present

## 2015-08-29 DIAGNOSIS — N6002 Solitary cyst of left breast: Secondary | ICD-10-CM | POA: Diagnosis not present

## 2015-08-30 LAB — CULTURE, GROUP A STREP: ORGANISM ID, BACTERIA: NORMAL

## 2015-09-01 ENCOUNTER — Telehealth: Payer: Self-pay | Admitting: *Deleted

## 2015-09-01 NOTE — Telephone Encounter (Signed)
Morgan Hubbard with [facility] called and let me know that the pathology results for Morgan Hubbard's breast biopsy came back positive for invasive ductal carcinoma. Since Dr. [name] is out of the office Morgan Hubbard said the doctor there will call Morgan [facility] with the results and get her set up with a surgical consultation with a doctor down there. They just wanted Dr. [name] to be aware of what's going on

## 2015-09-01 NOTE — Telephone Encounter (Signed)
Aware, thanks!

## 2015-09-05 ENCOUNTER — Encounter: Payer: Self-pay | Admitting: Cardiovascular Disease

## 2015-09-05 ENCOUNTER — Encounter: Payer: Self-pay | Admitting: Family Medicine

## 2015-09-11 ENCOUNTER — Ambulatory Visit (INDEPENDENT_AMBULATORY_CARE_PROVIDER_SITE_OTHER): Payer: Medicare HMO | Admitting: Family Medicine

## 2015-09-11 ENCOUNTER — Encounter: Payer: Self-pay | Admitting: Family Medicine

## 2015-09-11 VITALS — BP 160/70 | HR 85 | Temp 98.1°F | Ht 62.0 in | Wt 155.5 lb

## 2015-09-11 DIAGNOSIS — M19011 Primary osteoarthritis, right shoulder: Secondary | ICD-10-CM | POA: Diagnosis not present

## 2015-09-11 DIAGNOSIS — M25511 Pain in right shoulder: Secondary | ICD-10-CM

## 2015-09-11 MED ORDER — METHYLPREDNISOLONE ACETATE 40 MG/ML IJ SUSP
80.0000 mg | Freq: Once | INTRAMUSCULAR | Status: AC
  Administered 2015-09-11: 80 mg via INTRA_ARTICULAR

## 2015-09-11 NOTE — Progress Notes (Signed)
Pre visit review using our clinic review tool, if applicable. No additional management support is needed unless otherwise documented below in the visit note. 

## 2015-09-11 NOTE — Progress Notes (Signed)
Dr. Frederico Hamman T. Ival Basquez, MD, [facility] [place], [address] Phone: [phone] Fax: [phone]  09/11/2015  Patient: Morgan Hubbard, MRN: [id number], DOB: 1935-04-21, 81 y.o.  Primary Physician:  [name], MD   Chief Complaint  Patient presents with  . Shoulder Pain    Right-Wants Injection   Subjective:   Morgan Hubbard is a 81 y.o. very pleasant female patient who presents with the following:  R GH OA: The patient is very well-known to me. She is had some difficulties from a medical standpoint over the last year, and she has had a stroke, and she also has worsening congestive heart failure with an EF of 25%. Pacemaker and just dx with breast cancer.   Known quite significant R GH joint and AC joint OA. Had relief of symptoms for 1 year the last time I did a comined GH and AC joint injection.   inj GH joint.  Past Medical History, Surgical History, Social History, Family History, Problem List, Medications, and Allergies have been reviewed and updated if relevant.  Patient Active Problem List   Diagnosis Date Noted  . Acute pharyngitis 08/28/2015  . Poorly controlled type 2 diabetes mellitus ([place]) 06/21/2015  . Cerumen impaction 03/10/2015  . Abdominal pain, epigastric 11/15/2014  . Low blood magnesium 11/04/2014  . Encounter for Medicare annual wellness exam 06/13/2014  . CAP (community acquired pneumonia) 06/06/2014  . Cough 05/31/2014  . Viral gastroenteritis 05/31/2014  . UTI (urinary tract infection) 03/01/2014  . Stroke, small vessel ([place]) 01/18/2014  . Family history of hemochromatosis 01/18/2014  . Cerebral thrombosis with cerebral infarction ([place]) 01/14/2014  . Expressive aphasia 01/13/2014  . Nonischemic cardiomyopathy ([place]) 04/05/2013  . Shortness of breath 09/03/2012  . Gallstones 05/26/2012  . Elevated transaminase level 05/18/2012  . Spinal stenosis of lumbar region 12/10/2011  . Mixed  incontinence urge and stress 12/10/2011  . Renal insufficiency 11/04/2011  . Goiter 01/14/2011  . Tongue swelling 12/01/2010  . Fatty liver 08/28/2010  . DM 07/09/2010  . HYPERCHOLESTEROLEMIA 07/09/2010  . HYPERKALEMIA 07/09/2010  . REFLEX SYMPATHETIC DYSTROPHY 07/09/2010  . POLYNEUROPATHY 07/09/2010  . Essential hypertension 07/09/2010  . CARDIOMYOPATHY 07/09/2010  . OVERACTIVE BLADDER 07/09/2010    Past Medical History  Diagnosis Date  . GERD (gastroesophageal reflux disease)   . Ulcer   . Hyperlipidemia   . Colon polyps   . Thyroid disease   . Shingles   . Urine incontinence   . Goiter past remote    treated with RI  . Other primary cardiomyopathies   . Hyperpotassemia   . Inflammatory and toxic neuropathy, unspecified   . Reflex sympathetic dystrophy, unspecified   . Hypertonicity of bladder   . Hypertension   . Shortness of breath     ambulation  . Vertigo     hx of  . Overactive bladder   . LBBB (left bundle branch block)   . Cardiac LV ejection fraction >40%     "it was 43 last year" (01/13/2014)  . Type II diabetes mellitus ([place])   . Migraine     "stopped many years ago" (01/13/2014)  . Arthritis     "hands" (01/13/2014)  . Chronic lower back pain   . Basal cell carcinoma 01/2014    "bridge of nose"  . Expressive aphasia     "3 times in the last week" (01/13/2014)  . Mini stroke ([place]) 01/2014    Past Surgical  History  Procedure Laterality Date  . Finger surgery Left [place]    "knot on index"  . Kidney donation Left 1989  . Lumbar laminectomy/decompression microdiscectomy  1999  . Ct scan  3/12    outside hosp- lung nodule and gallstones  . Upper gastrointestinal endoscopy    . Colonoscopy  7/12    normal (hx of polyps in past)   . Back surgery    . Cataract extraction w/ intraocular lens implant Left ~ 2005    several eye injections  . Lumbar laminectomy/decompression microdiscectomy  01/31/2012    Procedure: LUMBAR LAMINECTOMY/DECOMPRESSION  MICRODISCECTOMY 2 LEVELS;  Surgeon: Eustace Moore, MD;  Location: [facility];  Service: Neurosurgery;  Laterality: N/A;  Thoracic twelve-lumbar one, lumbar one-two laminectomy   . Tubal ligation  1976  . Breast cyst excision Left 1959  . Posterior laminectomy / decompression cervical spine  1999  . Cardiac catheterization  "several"    [place]  . Bi-ventricular pacemaker insertion (crt-p)  10/2014    [facility]    Social History   Social History  . Marital Status: Married    Spouse Name: N/A  . Number of Children: 2  . Years of Education: N/A   Occupational History  . retired - Production manager     Social History Main Topics  . Smoking status: Never Smoker   . Smokeless tobacco: Never Used  . Alcohol Use: No  . Drug Use: No  . Sexual Activity: Yes   Other Topics Concern  . Not on file   Social History Narrative   Married 2nd time   Daughter is [name]     Family History  Problem Relation Age of Onset  . Arthritis Mother   . Cancer Mother     uterine and mouth  . Hyperlipidemia Mother   . Stroke Mother   . Hypertension Mother   . Alcohol abuse Father   . Diabetes Father   . Cancer Sister     breast  . Diabetes Sister   . Cancer Brother     lung cancer  . Kidney disease Brother   . Diabetes Brother   . Hyperlipidemia Sister   . Heart disease Sister   . Hypertension Sister   . Diabetes Sister   . Hyperlipidemia Brother   . Kidney failure Brother   . Diabetes Daughter     Allergies  Allergen Reactions  . Ace Inhibitors Swelling    REACTION: tongue swelling  . Codeine Other (See Comments)    REACTION: nausea  . Lisinopril Swelling    Swelling of tongue  . Nsaids   . Tylenol [Acetaminophen]     Medication list reviewed and updated in full in [place].  GEN: No fevers, chills. Nontoxic. Primarily MSK c/o today. MSK: Detailed in the HPI GI: tolerating PO intake without difficulty Neuro: No numbness, parasthesias, or tingling  associated. Otherwise the pertinent positives of the ROS are noted above.   Objective:   BP 160/70 mmHg  Pulse 85  Temp(Src) 98.1 F (36.7 C) (Oral)  Ht 5\' 2"  (1.575 m)  Wt 155 lb 8 oz (70.534 kg)  BMI 28.43 kg/m2  LMP 06/03/1992   GEN: WDWN, NAD, Non-toxic, Alert & Oriented x 3 HEENT: Atraumatic, Normocephalic.  Ears and Nose: No external deformity. EXTR: No clubbing/cyanosis/edema NEURO: Normal gait.  PSYCH: Normally interactive. Conversant. Not depressed or anxious appearing.  Calm demeanor.   Shoulder: R Inspection: No muscle wasting or winging Ecchymosis/edema:  neg  AC joint, scapula, clavicle: NT Cervical spine: NT, full ROM Spurling's: neg Abduction: full, 5/5 - QUITE LOUD CREPITUS Flexion: full, 5/5 IR, full, lift-off: 5/5 ER at neutral: full, 5/5 AC crossover and compression: neg Neer: neg Hawkins: neg Drop Test: neg Empty Can: neg Supraspinatus insertion: NT Bicipital groove: NT Speed's: neg Yergason's: neg Sulcus sign: neg Scapular dyskinesis: none C5-T1 intact Sensation intact Grip 5/5   Radiology: MR R shoulder, 2014 reviewed.   Assessment and Plan:   Glenohumeral arthritis, right  Right shoulder pain - Plan: methylPREDNISolone acetate (DEPO-MEDROL) injection 80 mg  No significant glenohumeral arthritis.  At this point it doesn't appear the patient has significant impingement symptoms currently, and her a.c. Joint appears to be relatively calm right now.  Intrarticular Shoulder Injection, RIGHT Verbal consent was obtained from the patient. Risks including infection explained and contrasted with benefits and alternatives. Patient prepped with Chloraprep and Ethyl Chloride used for anesthesia. An intraarticular shoulder injection was performed using the posterior approach. The patient tolerated the procedure well and had decreased pain post injection. No complications. Injection: 8 cc of Lidocaine 1% and 2 mL Depo-Medrol 40 mg. Needle: 22 gauge    Follow-up: No Follow-up on file.  Signed,  Maud Deed. Libby Goehring, MD   Patient's Medications  New Prescriptions   No medications on file  Previous Medications   ASPIRIN 325 MG TABLET    Take 325 mg by mouth daily.   ATORVASTATIN (LIPITOR) 80 MG TABLET    Take 80 mg by mouth daily.   CARVEDILOL (COREG) 12.5 MG TABLET    Take 1 tablet (12.5 mg total) by mouth 2 (two) times daily with a meal.   CHOLECALCIFEROL 1000 UNITS TABLET    Take 2,500 Units by mouth daily.    GABAPENTIN (NEURONTIN) 300 MG CAPSULE    TAKE 1 BY MOUTH 3 TIMES DAILY   GLIMEPIRIDE (AMARYL PO)    Take 3 mg by mouth 2 (two) times daily.    HYDRALAZINE (APRESOLINE) 50 MG TABLET    Take 1 tablet (50 mg total) by mouth 3 (three) times daily.   HYDROCODONE-ACETAMINOPHEN (NORCO/VICODIN) 5-325 MG TABLET    Take 1 tablet by mouth every 6 (six) hours as needed for severe pain. For pain   ISOSORBIDE MONONITRATE (IMDUR) 30 MG 24 HR TABLET    Take 1 tablet (30 mg total) by mouth daily.   LOSARTAN-HYDROCHLOROTHIAZIDE (HYZAAR) 50-12.5 MG PER TABLET    Take one-half tab once daily in the mornings.   MAGNESIUM CHLORIDE (SLOW-MAG) 64 MG TBEC SR TABLET    Take 2 tablets (128 mg total) by mouth daily.   METFORMIN (GLUCOPHAGE-XR) 500 MG 24 HR TABLET    Take 1,000 mg by mouth 2 (two) times daily.   MILK THISTLE PO    Take 1 capsule by mouth daily.   MIRABEGRON ER ([name]) 50 MG TB24    Take by mouth daily.   NITROGLYCERIN (NITROSTAT) 0.4 MG SL TABLET    Place 0.4 mg under the tongue every 5 (five) minutes as needed for chest pain.   PANTOPRAZOLE (PROTONIX) 40 MG TABLET    TAKE ONE TABLET BY MOUTH ONCE DAILY   VITAMIN B-12 (CYANOCOBALAMIN) 1000 MCG TABLET    Take 2,500 mcg by mouth daily.   Modified Medications   No medications on file  Discontinued Medications   No medications on file

## 2015-09-14 DIAGNOSIS — C50911 Malignant neoplasm of unspecified site of right female breast: Secondary | ICD-10-CM | POA: Diagnosis not present

## 2015-09-20 DIAGNOSIS — C50911 Malignant neoplasm of unspecified site of right female breast: Secondary | ICD-10-CM | POA: Diagnosis not present

## 2015-09-22 DIAGNOSIS — C50311 Malignant neoplasm of lower-inner quadrant of right female breast: Secondary | ICD-10-CM | POA: Diagnosis not present

## 2015-09-22 DIAGNOSIS — C50811 Malignant neoplasm of overlapping sites of right female breast: Secondary | ICD-10-CM | POA: Diagnosis not present

## 2015-09-22 DIAGNOSIS — C50911 Malignant neoplasm of unspecified site of right female breast: Secondary | ICD-10-CM | POA: Diagnosis not present

## 2015-09-28 DIAGNOSIS — C50311 Malignant neoplasm of lower-inner quadrant of right female breast: Secondary | ICD-10-CM | POA: Diagnosis not present

## 2015-09-29 ENCOUNTER — Telehealth: Payer: Self-pay | Admitting: Family Medicine

## 2015-09-29 NOTE — Telephone Encounter (Signed)
Patient called and wanted to let Dr.Tower know that she has breast cancer.  She's going to have several tests and surgery done at [facility].  Patient cancelled her appointments in May for lab and physical and will call back to reschedule when she's done at [facility].

## 2015-09-29 NOTE — Telephone Encounter (Signed)
That is fine I wish her well and keep an eye out for records as they come

## 2015-10-07 ENCOUNTER — Telehealth: Payer: Self-pay | Admitting: Family Medicine

## 2015-10-07 DIAGNOSIS — I10 Essential (primary) hypertension: Secondary | ICD-10-CM

## 2015-10-07 DIAGNOSIS — E7800 Pure hypercholesterolemia, unspecified: Secondary | ICD-10-CM

## 2015-10-07 NOTE — Telephone Encounter (Signed)
-----   Message from Ellamae Sia sent at 10/05/2015  2:57 PM EDT ----- Regarding: Lab orders for Wednesday, 5.10.17 Patient is scheduled for CPX labs, please order future labs, Thanks , Karna Christmas

## 2015-10-09 DIAGNOSIS — I6930 Unspecified sequelae of cerebral infarction: Secondary | ICD-10-CM | POA: Diagnosis not present

## 2015-10-10 ENCOUNTER — Other Ambulatory Visit: Payer: Self-pay

## 2015-10-10 MED ORDER — HYDROCODONE-ACETAMINOPHEN 5-325 MG PO TABS: 1.0000 | ORAL_TABLET | Freq: Four times a day (QID) | ORAL | Status: DC | PRN

## 2015-10-10 NOTE — Telephone Encounter (Signed)
Pt left v/m requesting rx hydrocodone apap. Call when ready for pick up. rx last printed # 30 on 06/26/15; last seen Dr [name] for f/u 11/04/2014. FYI for Dr [name]; pt having lumpectomy on 10/12/15 at [facility]

## 2015-10-10 NOTE — Telephone Encounter (Signed)
Thanks for the update Px printed for pick up in IN box

## 2015-10-11 ENCOUNTER — Other Ambulatory Visit: Payer: Medicare HMO

## 2015-10-11 NOTE — Telephone Encounter (Signed)
Pt notified Rx ready for pickup 

## 2015-10-12 DIAGNOSIS — C50811 Malignant neoplasm of overlapping sites of right female breast: Secondary | ICD-10-CM | POA: Diagnosis not present

## 2015-10-12 DIAGNOSIS — Z9889 Other specified postprocedural states: Secondary | ICD-10-CM | POA: Diagnosis not present

## 2015-10-12 DIAGNOSIS — Z808 Family history of malignant neoplasm of other organs or systems: Secondary | ICD-10-CM | POA: Diagnosis not present

## 2015-10-12 DIAGNOSIS — Z8673 Personal history of transient ischemic attack (TIA), and cerebral infarction without residual deficits: Secondary | ICD-10-CM | POA: Diagnosis not present

## 2015-10-12 DIAGNOSIS — E785 Hyperlipidemia, unspecified: Secondary | ICD-10-CM | POA: Diagnosis not present

## 2015-10-12 DIAGNOSIS — I509 Heart failure, unspecified: Secondary | ICD-10-CM | POA: Diagnosis not present

## 2015-10-12 DIAGNOSIS — G629 Polyneuropathy, unspecified: Secondary | ICD-10-CM | POA: Diagnosis not present

## 2015-10-12 DIAGNOSIS — E119 Type 2 diabetes mellitus without complications: Secondary | ICD-10-CM | POA: Diagnosis not present

## 2015-10-12 DIAGNOSIS — C50911 Malignant neoplasm of unspecified site of right female breast: Secondary | ICD-10-CM | POA: Diagnosis not present

## 2015-10-12 DIAGNOSIS — Z170 Estrogen receptor positive status [ER+]: Secondary | ICD-10-CM | POA: Diagnosis not present

## 2015-10-12 DIAGNOSIS — I110 Hypertensive heart disease with heart failure: Secondary | ICD-10-CM | POA: Diagnosis not present

## 2015-10-12 DIAGNOSIS — Z950 Presence of cardiac pacemaker: Secondary | ICD-10-CM | POA: Diagnosis not present

## 2015-10-16 ENCOUNTER — Encounter: Payer: Medicare HMO | Admitting: Family Medicine

## 2015-10-18 DIAGNOSIS — C50911 Malignant neoplasm of unspecified site of right female breast: Secondary | ICD-10-CM | POA: Diagnosis not present

## 2015-10-20 DIAGNOSIS — Z4502 Encounter for adjustment and management of automatic implantable cardiac defibrillator: Secondary | ICD-10-CM | POA: Diagnosis not present

## 2015-10-20 DIAGNOSIS — Z7984 Long term (current) use of oral hypoglycemic drugs: Secondary | ICD-10-CM | POA: Diagnosis not present

## 2015-10-20 DIAGNOSIS — Z79899 Other long term (current) drug therapy: Secondary | ICD-10-CM | POA: Diagnosis not present

## 2015-10-20 DIAGNOSIS — Z7982 Long term (current) use of aspirin: Secondary | ICD-10-CM | POA: Diagnosis not present

## 2015-10-20 DIAGNOSIS — Z95810 Presence of automatic (implantable) cardiac defibrillator: Secondary | ICD-10-CM | POA: Diagnosis not present

## 2015-11-01 DIAGNOSIS — C50811 Malignant neoplasm of overlapping sites of right female breast: Secondary | ICD-10-CM | POA: Diagnosis not present

## 2015-11-01 DIAGNOSIS — C50311 Malignant neoplasm of lower-inner quadrant of right female breast: Secondary | ICD-10-CM | POA: Diagnosis not present

## 2015-11-01 DIAGNOSIS — C50911 Malignant neoplasm of unspecified site of right female breast: Secondary | ICD-10-CM | POA: Diagnosis not present

## 2015-11-03 ENCOUNTER — Ambulatory Visit: Payer: Medicare HMO | Admitting: Family Medicine

## 2015-11-03 ENCOUNTER — Ambulatory Visit (INDEPENDENT_AMBULATORY_CARE_PROVIDER_SITE_OTHER): Payer: Medicare HMO | Admitting: Family Medicine

## 2015-11-03 ENCOUNTER — Encounter: Payer: Self-pay | Admitting: Family Medicine

## 2015-11-03 VITALS — BP 122/58 | HR 78 | Temp 98.1°F | Ht 62.0 in | Wt 156.5 lb

## 2015-11-03 DIAGNOSIS — L509 Urticaria, unspecified: Secondary | ICD-10-CM

## 2015-11-03 DIAGNOSIS — L0292 Furuncle, unspecified: Secondary | ICD-10-CM | POA: Diagnosis not present

## 2015-11-03 DIAGNOSIS — H34832 Tributary (branch) retinal vein occlusion, left eye, with macular edema: Secondary | ICD-10-CM | POA: Diagnosis not present

## 2015-11-03 MED ORDER — MAGNESIUM CHLORIDE 64 MG PO TBEC: 2.0000 | DELAYED_RELEASE_TABLET | Freq: Every day | ORAL | Status: DC

## 2015-11-03 MED ORDER — CEPHALEXIN 500 MG PO CAPS: 500.0000 mg | ORAL_CAPSULE | Freq: Three times a day (TID) | ORAL | Status: DC

## 2015-11-03 NOTE — Progress Notes (Signed)
Subjective:    Patient ID: Morgan Hubbard, female    DOB: 1934/08/13, 81 y.o.   MRN: [id number]  HPI Here with hives   Whelps occur around/behind ears - she takes zyrtec for this as well as benadryl  Also occur on her sides  None on chest or ext  No tongue swelling or wheezing   ? What she could react to  Just started new medicine for breast cancer-but hives started first   No change in diet - has eaten strawberries (nothing new) No new fish or shellfish     Also a cyst on back of her neck (? Unsure if related)- on her incision area   Patient Active Problem List   Diagnosis Date Noted  . Boil 11/03/2015  . Urticaria 11/03/2015  . Acute pharyngitis 08/28/2015  . Poorly controlled type 2 diabetes mellitus ([place]) 06/21/2015  . Cerumen impaction 03/10/2015  . Abdominal pain, epigastric 11/15/2014  . Low blood magnesium 11/04/2014  . Encounter for Medicare annual wellness exam 06/13/2014  . CAP (community acquired pneumonia) 06/06/2014  . Cough 05/31/2014  . Viral gastroenteritis 05/31/2014  . UTI (urinary tract infection) 03/01/2014  . Stroke, small vessel ([place]) 01/18/2014  . Family history of hemochromatosis 01/18/2014  . Cerebral thrombosis with cerebral infarction ([place]) 01/14/2014  . Expressive aphasia 01/13/2014  . Nonischemic cardiomyopathy ([place]) 04/05/2013  . Shortness of breath 09/03/2012  . Gallstones 05/26/2012  . Elevated transaminase level 05/18/2012  . Spinal stenosis of lumbar region 12/10/2011  . Mixed incontinence urge and stress 12/10/2011  . Renal insufficiency 11/04/2011  . Goiter 01/14/2011  . Tongue swelling 12/01/2010  . Fatty liver 08/28/2010  . DM 07/09/2010  . HYPERCHOLESTEROLEMIA 07/09/2010  . HYPERKALEMIA 07/09/2010  . REFLEX SYMPATHETIC DYSTROPHY 07/09/2010  . POLYNEUROPATHY 07/09/2010  . Essential hypertension 07/09/2010  . CARDIOMYOPATHY 07/09/2010  . OVERACTIVE BLADDER 07/09/2010   Past Medical History  Diagnosis Date  . GERD  (gastroesophageal reflux disease)   . Ulcer   . Hyperlipidemia   . Colon polyps   . Thyroid disease   . Shingles   . Urine incontinence   . Goiter past remote    treated with RI  . Other primary cardiomyopathies   . Hyperpotassemia   . Inflammatory and toxic neuropathy, unspecified   . Reflex sympathetic dystrophy, unspecified   . Hypertonicity of bladder   . Hypertension   . Shortness of breath     ambulation  . Vertigo     hx of  . Overactive bladder   . LBBB (left bundle branch block)   . Cardiac LV ejection fraction >40%     "it was 43 last year" (01/13/2014)  . Type II diabetes mellitus ([place])   . Migraine     "stopped many years ago" (01/13/2014)  . Arthritis     "hands" (01/13/2014)  . Chronic lower back pain   . Basal cell carcinoma 01/2014    "bridge of nose"  . Expressive aphasia     "3 times in the last week" (01/13/2014)  . Mini stroke ([place]) 01/2014   Past Surgical History  Procedure Laterality Date  . Finger surgery Left [place]    "knot on index"  . Kidney donation Left 1989  . Lumbar laminectomy/decompression microdiscectomy  1999  . Ct scan  3/12    outside hosp- lung nodule and gallstones  . Upper gastrointestinal endoscopy    . Colonoscopy  7/12    normal (hx of polyps in past)   .  Back surgery    . Cataract extraction w/ intraocular lens implant Left ~ 2005    several eye injections  . Lumbar laminectomy/decompression microdiscectomy  01/31/2012    Procedure: LUMBAR LAMINECTOMY/DECOMPRESSION MICRODISCECTOMY 2 LEVELS;  Surgeon: Eustace Moore, MD;  Location: [facility];  Service: Neurosurgery;  Laterality: N/A;  Thoracic twelve-lumbar one, lumbar one-two laminectomy   . Tubal ligation  1976  . Breast cyst excision Left 1959  . Posterior laminectomy / decompression cervical spine  1999  . Cardiac catheterization  "several"    [place]  . Bi-ventricular pacemaker insertion (crt-p)  10/2014    [facility]   Social History  Substance Use Topics  .  Smoking status: Never Smoker   . Smokeless tobacco: Never Used  . Alcohol Use: No   Family History  Problem Relation Age of Onset  . Arthritis Mother   . Cancer Mother     uterine and mouth  . Hyperlipidemia Mother   . Stroke Mother   . Hypertension Mother   . Alcohol abuse Father   . Diabetes Father   . Cancer Sister     breast  . Diabetes Sister   . Cancer Brother     lung cancer  . Kidney disease Brother   . Diabetes Brother   . Hyperlipidemia Sister   . Heart disease Sister   . Hypertension Sister   . Diabetes Sister   . Hyperlipidemia Brother   . Kidney failure Brother   . Diabetes Daughter    Allergies  Allergen Reactions  . Ace Inhibitors Swelling    REACTION: tongue swelling  . Codeine Other (See Comments)    REACTION: nausea  . Lisinopril Swelling    Swelling of tongue  . Nsaids   . Tylenol [Acetaminophen]    Current Outpatient Prescriptions on File Prior to Visit  Medication Sig Dispense Refill  . aspirin 325 MG tablet Take 325 mg by mouth daily.    Marland Kitchen atorvastatin (LIPITOR) 80 MG tablet Take 80 mg by mouth daily.    . carvedilol (COREG) 12.5 MG tablet Take 1 tablet (12.5 mg total) by mouth 2 (two) times daily with a meal. 180 tablet 4  . Cholecalciferol 1000 UNITS tablet Take 2,500 Units by mouth daily.     Marland Kitchen gabapentin (NEURONTIN) 300 MG capsule TAKE 1 BY MOUTH 3 TIMES DAILY 270 capsule 3  . Glimepiride (AMARYL PO) Take 3 mg by mouth 2 (two) times daily.     . hydrALAZINE (APRESOLINE) 50 MG tablet Take 1 tablet (50 mg total) by mouth 3 (three) times daily. (Patient taking differently: Take 50 mg by mouth 6 (six) times daily. ) 270 tablet 3  . HYDROcodone-acetaminophen (NORCO/VICODIN) 5-325 MG tablet Take 1 tablet by mouth every 6 (six) hours as needed for severe pain. For pain 30 tablet 0  . isosorbide mononitrate (IMDUR) 30 MG 24 hr tablet Take 1 tablet (30 mg total) by mouth daily. 90 tablet 4  . losartan-hydrochlorothiazide (HYZAAR) 50-12.5 MG per  tablet Take one-half tab once daily in the mornings.    . metFORMIN (GLUCOPHAGE-XR) 500 MG 24 hr tablet Take 1,000 mg by mouth 2 (two) times daily.    Marland Kitchen MILK THISTLE PO Take 1 capsule by mouth daily.    . mirabegron ER ([name]) 50 MG TB24 Take by mouth daily.    . nitroGLYCERIN (NITROSTAT) 0.4 MG SL tablet Place 0.4 mg under the tongue every 5 (five) minutes as needed for chest pain.    . pantoprazole (  PROTONIX) 40 MG tablet TAKE ONE TABLET BY MOUTH ONCE DAILY 90 tablet 3  . vitamin B-12 (CYANOCOBALAMIN) 1000 MCG tablet Take 2,500 mcg by mouth daily.      No current facility-administered medications on file prior to visit.    Review of Systems    Review of Systems  Constitutional: Negative for fever, appetite change, fatigue and unexpected weight change.  Eyes: Negative for pain and visual disturbance.  Respiratory: Negative for cough and shortness of breath.   Cardiovascular: Negative for cp or palpitations    Gastrointestinal: Negative for nausea, diarrhea and constipation.  Genitourinary: Negative for urgency and frequency.  Skin: Negative for pallor , pos for itchy hives on neck and sides of torso  Neurological: Negative for weakness, light-headedness, numbness and headaches.  Hematological: Negative for adenopathy. Does not bruise/bleed easily.  Psychiatric/Behavioral: Negative for dysphoric mood. The patient is not nervous/anxious.      Objective:   Physical Exam  Constitutional: She appears well-developed and well-nourished. No distress.  overwt and well appearing   HENT:  Head: Normocephalic and atraumatic.  Mouth/Throat: Oropharynx is clear and moist.  No mouth or throat swelling   Eyes: Conjunctivae and EOM are normal. Pupils are equal, round, and reactive to light. Right eye exhibits no discharge. Left eye exhibits no discharge. No scleral icterus.  Neck: Normal range of motion. Neck supple.  Cardiovascular: Normal rate and regular rhythm.   Pulmonary/Chest: Effort  normal and breath sounds normal. No respiratory distress. She has no wheezes.  Musculoskeletal: She exhibits no edema.  Lymphadenopathy:    She has no cervical adenopathy.  Neurological: She is alert. No cranial nerve deficit.  Skin: Skin is warm and dry. No pallor.  No hives seen today (they come and go)  1 cm infected seb cyst with abscess on back of neck at end of surgical scar  Prepped and lanced near it's natural opening/pore  Pus expressed with relief  Cleaned and dressed with abx ointment     Psychiatric: She has a normal mood and affect.      Assessment & Plan:   Problem List Items Addressed This Visit      Musculoskeletal and Integument   Urticaria    Discussed poss env allergic triggers as well as others such as stress/heat or foods Enc zyrtec daily  Benadryl prn  Staying cool  Avoiding products with color and fragrances Keep a food journal and update  For hives continue the zyrtec 10 mg daily  Use benadryl as needed  Try to keep cool- heat makes hives worse - don't get hot  Avoid strawberries  Keep track of what you are exposed to  Update me if this does not resolved       Boil - Primary    Pt has what looks to be a chronic seb cyst that is infected Lanced and pus easily expressed with relief  Dressed with abx oint and bandage and wound ed given  Will cover with doxycycline Update if not starting to improve in a week or if worsening        Relevant Medications   cephALEXin (KEFLEX) 500 MG capsule

## 2015-11-03 NOTE — Patient Instructions (Signed)
We expressed pus out of the cyst  Take the antibiotic as directed Keep it clean with soap and water and cover with a band aid  Update me if symptoms worsen   For hives continue the zyrtec 10 mg daily  Use benadryl as needed  Try to keep cool- heat makes hives worse - don't get hot  Avoid strawberries  Keep track of what you are exposed to  Update me if this does not resolved

## 2015-11-03 NOTE — Progress Notes (Signed)
Pre visit review using our clinic review tool, if applicable. No additional management support is needed unless otherwise documented below in the visit note. 

## 2015-11-05 NOTE — Assessment & Plan Note (Signed)
Discussed poss env allergic triggers as well as others such as stress/heat or foods Enc zyrtec daily  Benadryl prn  Staying cool  Avoiding products with color and fragrances Keep a food journal and update  For hives continue the zyrtec 10 mg daily  Use benadryl as needed  Try to keep cool- heat makes hives worse - don't get hot  Avoid strawberries  Keep track of what you are exposed to  Update me if this does not resolved

## 2015-11-05 NOTE — Assessment & Plan Note (Signed)
Pt has what looks to be a chronic seb cyst that is infected Lanced and pus easily expressed with relief  Dressed with abx oint and bandage and wound ed given  Will cover with doxycycline Update if not starting to improve in a week or if worsening

## 2015-11-06 ENCOUNTER — Ambulatory Visit: Payer: Medicare HMO | Admitting: Family Medicine

## 2015-11-17 DIAGNOSIS — L508 Other urticaria: Secondary | ICD-10-CM | POA: Diagnosis not present

## 2015-11-17 DIAGNOSIS — L570 Actinic keratosis: Secondary | ICD-10-CM | POA: Diagnosis not present

## 2015-11-17 DIAGNOSIS — X32XXXA Exposure to sunlight, initial encounter: Secondary | ICD-10-CM | POA: Diagnosis not present

## 2015-11-21 DIAGNOSIS — E1129 Type 2 diabetes mellitus with other diabetic kidney complication: Secondary | ICD-10-CM | POA: Diagnosis not present

## 2015-11-21 DIAGNOSIS — E1165 Type 2 diabetes mellitus with hyperglycemia: Secondary | ICD-10-CM | POA: Diagnosis not present

## 2015-11-21 DIAGNOSIS — R809 Proteinuria, unspecified: Secondary | ICD-10-CM | POA: Diagnosis not present

## 2015-11-28 DIAGNOSIS — E1129 Type 2 diabetes mellitus with other diabetic kidney complication: Secondary | ICD-10-CM | POA: Diagnosis not present

## 2015-11-28 DIAGNOSIS — I10 Essential (primary) hypertension: Secondary | ICD-10-CM | POA: Diagnosis not present

## 2015-11-28 DIAGNOSIS — R809 Proteinuria, unspecified: Secondary | ICD-10-CM | POA: Diagnosis not present

## 2015-11-28 DIAGNOSIS — E1165 Type 2 diabetes mellitus with hyperglycemia: Secondary | ICD-10-CM | POA: Diagnosis not present

## 2015-12-05 DIAGNOSIS — R69 Illness, unspecified: Secondary | ICD-10-CM | POA: Diagnosis not present

## 2015-12-12 DIAGNOSIS — Z170 Estrogen receptor positive status [ER+]: Secondary | ICD-10-CM | POA: Diagnosis not present

## 2015-12-12 DIAGNOSIS — C50311 Malignant neoplasm of lower-inner quadrant of right female breast: Secondary | ICD-10-CM | POA: Diagnosis not present

## 2015-12-20 DIAGNOSIS — I517 Cardiomegaly: Secondary | ICD-10-CM | POA: Diagnosis not present

## 2015-12-20 DIAGNOSIS — Z7982 Long term (current) use of aspirin: Secondary | ICD-10-CM | POA: Diagnosis not present

## 2015-12-20 DIAGNOSIS — Z7984 Long term (current) use of oral hypoglycemic drugs: Secondary | ICD-10-CM | POA: Diagnosis not present

## 2015-12-20 DIAGNOSIS — I10 Essential (primary) hypertension: Secondary | ICD-10-CM | POA: Diagnosis not present

## 2015-12-20 DIAGNOSIS — Z79899 Other long term (current) drug therapy: Secondary | ICD-10-CM | POA: Diagnosis not present

## 2015-12-20 DIAGNOSIS — I313 Pericardial effusion (noninflammatory): Secondary | ICD-10-CM | POA: Diagnosis not present

## 2015-12-20 DIAGNOSIS — I428 Other cardiomyopathies: Secondary | ICD-10-CM | POA: Diagnosis not present

## 2015-12-20 DIAGNOSIS — R931 Abnormal findings on diagnostic imaging of heart and coronary circulation: Secondary | ICD-10-CM | POA: Diagnosis not present

## 2015-12-20 DIAGNOSIS — Z95810 Presence of automatic (implantable) cardiac defibrillator: Secondary | ICD-10-CM | POA: Diagnosis not present

## 2015-12-20 DIAGNOSIS — Z79811 Long term (current) use of aromatase inhibitors: Secondary | ICD-10-CM | POA: Diagnosis not present

## 2015-12-22 DIAGNOSIS — R809 Proteinuria, unspecified: Secondary | ICD-10-CM | POA: Diagnosis not present

## 2015-12-22 DIAGNOSIS — E1165 Type 2 diabetes mellitus with hyperglycemia: Secondary | ICD-10-CM | POA: Diagnosis not present

## 2015-12-22 DIAGNOSIS — E1129 Type 2 diabetes mellitus with other diabetic kidney complication: Secondary | ICD-10-CM | POA: Diagnosis not present

## 2015-12-22 DIAGNOSIS — I10 Essential (primary) hypertension: Secondary | ICD-10-CM | POA: Diagnosis not present

## 2015-12-25 ENCOUNTER — Telehealth: Payer: Self-pay | Admitting: Family Medicine

## 2015-12-25 MED ORDER — HYDROCODONE-ACETAMINOPHEN 5-325 MG PO TABS: 1.0000 | ORAL_TABLET | Freq: Four times a day (QID) | ORAL | 0 refills | Status: DC | PRN

## 2015-12-25 NOTE — Telephone Encounter (Signed)
Pt notified Rx ready for pickup 

## 2015-12-25 NOTE — Telephone Encounter (Signed)
Px printed for pick up in IN box  

## 2015-12-25 NOTE — Telephone Encounter (Signed)
Pt left v/m requesting rx hydrocodone apap. Call when ready for pick up. rx last printed # 30 on  10/10/15; last seen Dr [name] 11/03/15.  Please call when ready for pickup [phone]

## 2015-12-27 DIAGNOSIS — I6930 Unspecified sequelae of cerebral infarction: Secondary | ICD-10-CM | POA: Diagnosis not present

## 2016-01-02 ENCOUNTER — Encounter: Payer: Medicare HMO | Attending: Internal Medicine | Admitting: Dietician

## 2016-01-02 ENCOUNTER — Encounter: Payer: Self-pay | Admitting: Dietician

## 2016-01-02 VITALS — BP 146/72 | Ht 62.0 in | Wt 152.7 lb

## 2016-01-02 DIAGNOSIS — Z6827 Body mass index (BMI) 27.0-27.9, adult: Secondary | ICD-10-CM | POA: Insufficient documentation

## 2016-01-02 DIAGNOSIS — E119 Type 2 diabetes mellitus without complications: Secondary | ICD-10-CM

## 2016-01-02 DIAGNOSIS — Z713 Dietary counseling and surveillance: Secondary | ICD-10-CM | POA: Insufficient documentation

## 2016-01-02 NOTE — Patient Instructions (Signed)
  Check blood sugars 2 x day before breakfast and 2 hrs after supper every day Exercise:  Try exercises in Workout To GO handout if able and contact [facility] in [place] for water aerobic classes Avoid sugar sweetened drinks (soda, tea, coffee, sports drinks, juices) Eat 3 meals day,   1-3  snacks a day Avoid sweetened beverages and fruit juices Limit intake of fried foods, snack foods and sweets Space meals 4-5 hours apart Complete 3 Day Food Record and bring to next appt Make  Dentist appointment Bring blood sugar records to the next appointment/class Get a [place] container Check feet daily Carry fast acting glucose and a snack at all times Rotate injection sites Return for appointment/classes on: 01-16-16

## 2016-01-02 NOTE — Progress Notes (Signed)
Diabetes Self-Management Education  Visit Type: First/Initial  Appt. Start Time: 1330 Appt. End Time: 1430  01/02/2016  Ms. [employer], identified by name and date of birth, is a 81 y.o. female with a diagnosis of Diabetes: Type 2.   ASSESSMENT  Blood pressure (!) 146/72, height 5\' 2"  (1.575 m), weight 152 lb 11.2 oz (69.3 kg), last menstrual period 06/03/1992. Body mass index is 27.93 kg/m.      Diabetes Self-Management Education - 01/02/16 1540      Visit Information   Visit Type First/Initial     Initial Visit   Diabetes Type Type 2     Health Coping   How would you rate your overall health? Fair     Psychosocial Assessment   Patient Belief/Attitude about Diabetes Motivated to manage diabetes   Self-care barriers Unsteady gait/risk for falls   Patient Concerns Medication;Monitoring;Healthy Lifestyle;Problem Solving;Glycemic Control;Weight Control;Nutrition/Meal planning   Special Needs None   Preferred Learning Style Auditory;Visual;Hands on   Learning Readiness Ready   What is the last grade level you completed in school? 12     Pre-Education Assessment   Patient understands the diabetes disease and treatment process. Needs Review   Patient understands incorporating nutritional management into lifestyle. Needs Review   Patient undertands incorporating physical activity into lifestyle. Needs Review   Patient understands using medications safely. Needs Review   Patient understands monitoring blood glucose, interpreting and using results Needs Review   Patient understands prevention, detection, and treatment of acute complications. Needs Review   Patient understands prevention, detection, and treatment of chronic complications. Needs Review   Patient understands how to develop strategies to address psychosocial issues. Needs Review     Complications   Last HgB A1C per patient/outside source 9.4 %  11-21-15   How often do you check your blood sugar? --  1x/day   Fasting Blood glucose range (mg/dL) 70-129   Have you had a dilated eye exam in the past 12 months? Yes  11-2015   Have you had a dental exam in the past 12 months? No  2 years ago   Are you checking your feet? Yes   How many days per week are you checking your feet? 7     Dietary Intake   Breakfast --  eats 3 meals/day and 3 snacks   Snack (morning) --  eats snack foods daily   Lunch --  eats sweets 2-3x/day   Beverage(s) --  drinks sugar free drinks 1-3x/day and water 2-3/day     Exercise   Exercise Type ADL's  exercise limited due to back problems and pain/numbness in feet     Patient Education   Previous Diabetes Education Yes (please comment)  at [facility]   Disease state  Definition of diabetes, type 1 and 2, and the diagnosis of diabetes;Explored patient's options for treatment of their diabetes;Factors that contribute to the development of diabetes   Nutrition management  Food label reading, portion sizes and measuring food.;Carbohydrate counting;Role of diet in the treatment of diabetes and the relationship between the three main macronutrients and blood glucose level   Physical activity and exercise  Role of exercise on diabetes management, blood pressure control and cardiac health.;Helped patient identify appropriate exercises in relation to his/her diabetes, diabetes complications and other health issue.  gave pt Workout to Go handout-encouraged to try exercises in it if able and to call [facility] in [place] for water aerobic classes   Medications Taught/reviewed insulin injection, site  rotation, insulin storage and needle disposal.;Reviewed patients medication for diabetes, action, purpose, timing of dose and side effects.   Monitoring Purpose and frequency of SMBG.;Taught/discussed recording of test results and interpretation of SMBG.;Identified appropriate SMBG and/or A1C goals.;Daily foot exams;Yearly dilated eye exam   Acute complications Taught  treatment of hypoglycemia - the 15 rule.;Covered sick day management with medication and food.-gave medical alert ID card to carry at all times   Chronic complications Relationship between chronic complications and blood glucose control;Assessed and discussed foot care and prevention of foot problems;Identified and discussed with patient  current chronic complications;Dental care;Retinopathy and reason for yearly dilated eye exams  pt has only 1 kidney-donated one kidney 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 Expect positive outcomes      Individualized Plan for Diabetes Self-Management Training:   Learning Objective:  Patient will have a greater understanding of diabetes self-management. Patient education plan is to attend individual and/or group sessions per assessed needs and concerns.   Plan:   Patient Instructions   Check blood sugars 2 x day before breakfast and 2 hrs after supper every day Exercise:  Try exercises in Workout To GO handout if able and contact [facility] in [place] for water aerobic classes Avoid sugar sweetened drinks (soda, tea, coffee, sports drinks, juices) Eat 3 meals day,   1-3  snacks a day Avoid sweetened beverages and fruit juices Limit intake of fried foods, snack foods and sweets Space meals 4-5 hours apart Complete 3 Day Food Record and bring to next appt Make  Dentist appointment Bring blood sugar records to the next appointment/class Get a Sharps container Check feet daily Carry fast acting glucose and a snack at all times Rotate injection sites Return for appointment/classes on: 01-16-16   Expected Outcomes:  Demonstrated interest in learning. Expect positive  outcomes  Education material provided: General Meal Planning Guidelines, Low BG handout, Workout to Go handout, medical alert ID card  If problems or questions, patient to contact team via:  [phone]  Future DSME appointment:

## 2016-01-04 ENCOUNTER — Other Ambulatory Visit: Payer: Self-pay | Admitting: Family Medicine

## 2016-01-12 DIAGNOSIS — R69 Illness, unspecified: Secondary | ICD-10-CM | POA: Diagnosis not present

## 2016-01-16 ENCOUNTER — Ambulatory Visit: Payer: Medicare HMO | Admitting: Dietician

## 2016-01-16 DIAGNOSIS — Z4502 Encounter for adjustment and management of automatic implantable cardiac defibrillator: Secondary | ICD-10-CM | POA: Diagnosis not present

## 2016-01-16 DIAGNOSIS — Z95810 Presence of automatic (implantable) cardiac defibrillator: Secondary | ICD-10-CM | POA: Diagnosis not present

## 2016-01-16 DIAGNOSIS — I493 Ventricular premature depolarization: Secondary | ICD-10-CM | POA: Diagnosis not present

## 2016-01-25 ENCOUNTER — Encounter: Payer: Self-pay | Admitting: Dietician

## 2016-01-25 ENCOUNTER — Encounter: Payer: Medicare HMO | Admitting: Dietician

## 2016-01-25 VITALS — BP 110/54 | Ht 62.0 in | Wt 153.2 lb

## 2016-01-25 DIAGNOSIS — Z6827 Body mass index (BMI) 27.0-27.9, adult: Secondary | ICD-10-CM | POA: Diagnosis not present

## 2016-01-25 DIAGNOSIS — E119 Type 2 diabetes mellitus without complications: Secondary | ICD-10-CM | POA: Diagnosis not present

## 2016-01-25 DIAGNOSIS — Z713 Dietary counseling and surveillance: Secondary | ICD-10-CM | POA: Diagnosis not present

## 2016-01-25 NOTE — Progress Notes (Signed)
Diabetes Self-Management Education  Visit Type:  Follow-up  Appt. Start Time: 1330 Appt. End Time: 1430  01/25/2016  Ms. [employer], identified by name and date of birth, is a 81 y.o. female with a diagnosis of Diabetes:  .   ASSESSMENT  Blood pressure (!) 110/54, height 5\' 2"  (1.575 m), weight 153 lb 3.2 oz (69.5 kg), last menstrual period 06/03/1992. Body mass index is 28.02 kg/m.       Diabetes Self-Management Education - [id number]      Complications   How often do you check your blood sugar? 1-2 times/day   Fasting Blood glucose range (mg/dL) 70-129;130-179   Postprandial Blood glucose range (mg/dL) >200;130-179   Have you had a dilated eye exam in the past 12 months? Yes   Have you had a dental exam in the past 12 months? No   Are you checking your feet? Yes   How many days per week are you checking your feet? 7     Dietary Intake   Breakfast 2-3 meals and 1-2 snacks daily; somewhat erratic schedule per patient     Exercise   Exercise Type ADL's  unable due to back issues     Patient Education   Nutrition management  Role of diet in the treatment of diabetes and the relationship between the three main macronutrients and blood glucose level;Food label reading, portion sizes and measuring food.;Meal timing in regards to the patients' current diabetes medication.;Meal options for control of blood glucose level and chronic complications.;Other (comment)  basic meal planning for 1300kcal; balanced menu ideas   Physical activity and exercise  Role of exercise on diabetes management, blood pressure control and cardiac health.   Medications Other (comment)  length of action of long-acting vs. short-acting insulin   Acute complications Taught treatment of hypoglycemia - the 15 rule.     Post-Education Assessment   Patient understands the diabetes disease and treatment process. Demonstrates understanding / competency   Patient understands incorporating nutritional  management into lifestyle. Demonstrates understanding / competency   Patient undertands incorporating physical activity into lifestyle. Demonstrates understanding / competency   Patient understands using medications safely. Demonstrates understanding / competency   Patient understands monitoring blood glucose, interpreting and using results Demonstrates understanding / competency   Patient understands prevention, detection, and treatment of acute complications. Demonstrates understanding / competency   Patient understands prevention, detection, and treatment of chronic complications. Demonstrates understanding / competency   Patient understands how to develop strategies to address psychosocial issues. Demonstrates understanding / competency   Patient understands how to develop strategies to promote health/change behavior. Demonstrates understanding / competency     Outcomes   Program Status Completed      Learning Objective:  Patient will have a greater understanding of diabetes self-management. Patient education plan is to attend individual and/or group sessions per assessed needs and concerns.   Plan:   Patient Instructions   Make sure to include a protein food with each meal. If not meat, then egg or lowfat cheese or nuts or peanut butter or Greek yogurt. Beans like pintos, navy, black, etc. Count as carb and protein (1/2 - 1cup).  Use menus for more breakfast ideas.   Plan to eat a vegetable with both lunch and supper. Baby carrots, mini cucumbers, etc. Or salad are easy eaten raw. Or try frozen vegetable blends.   You can look up nutrition information on foods at [email or web address].     Expected Outcomes:  Demonstrated interest in learning.  Expect positive outcomes  Education material provided: Planning A Balanced Meal; Quick and Healthy Meal Ideas, Smart Snacking  If problems or questions, patient to contact team via:  Phone

## 2016-01-25 NOTE — Patient Instructions (Signed)
   Make sure to include a protein food with each meal. If not meat, then egg or lowfat cheese or nuts or peanut butter or Greek yogurt. Beans like pintos, navy, black, etc. Count as carb and protein (1/2 - 1cup).  Use menus for more breakfast ideas.   Plan to eat a vegetable with both lunch and supper. Baby carrots, mini cucumbers, etc. Or salad are easy eaten raw. Or try frozen vegetable blends.   You can look up nutrition information on foods at [email or web address].

## 2016-02-08 DIAGNOSIS — R69 Illness, unspecified: Secondary | ICD-10-CM | POA: Diagnosis not present

## 2016-02-16 ENCOUNTER — Encounter: Payer: Self-pay | Admitting: Family Medicine

## 2016-02-16 ENCOUNTER — Ambulatory Visit (INDEPENDENT_AMBULATORY_CARE_PROVIDER_SITE_OTHER): Payer: Medicare HMO | Admitting: Family Medicine

## 2016-02-16 ENCOUNTER — Ambulatory Visit (INDEPENDENT_AMBULATORY_CARE_PROVIDER_SITE_OTHER)
Admission: RE | Admit: 2016-02-16 | Discharge: 2016-02-16 | Disposition: A | Discharge status: Home / Self Care | Payer: Medicare HMO | Source: Ambulatory Visit | Attending: Family Medicine | Admitting: Family Medicine

## 2016-02-16 VITALS — BP 122/58 | HR 73 | Temp 98.3°F | Ht 62.0 in | Wt 148.2 lb

## 2016-02-16 DIAGNOSIS — E1165 Type 2 diabetes mellitus with hyperglycemia: Secondary | ICD-10-CM | POA: Diagnosis not present

## 2016-02-16 DIAGNOSIS — J069 Acute upper respiratory infection, unspecified: Secondary | ICD-10-CM

## 2016-02-16 DIAGNOSIS — R05 Cough: Secondary | ICD-10-CM | POA: Diagnosis not present

## 2016-02-16 DIAGNOSIS — R809 Proteinuria, unspecified: Secondary | ICD-10-CM | POA: Diagnosis not present

## 2016-02-16 DIAGNOSIS — E1129 Type 2 diabetes mellitus with other diabetic kidney complication: Secondary | ICD-10-CM | POA: Diagnosis not present

## 2016-02-16 DIAGNOSIS — H6121 Impacted cerumen, right ear: Secondary | ICD-10-CM | POA: Diagnosis not present

## 2016-02-16 MED ORDER — BENZONATATE 200 MG PO CAPS: 200.0000 mg | ORAL_CAPSULE | Freq: Three times a day (TID) | ORAL | 1 refills | Status: DC | PRN

## 2016-02-16 MED ORDER — AZITHROMYCIN 250 MG PO TABS: ORAL_TABLET | ORAL | 0 refills | Status: DC

## 2016-02-16 NOTE — Progress Notes (Signed)
Pre visit review using our clinic review tool, if applicable. No additional management support is needed unless otherwise documented below in the visit note. 

## 2016-02-16 NOTE — Patient Instructions (Addendum)
Chest xray today  An expectorant like robitussin may help - if you want to try the DM form- it may quiet the cough  Try the tessalon for cough also  Take zithromax  Drink lots of fluids  Ear was irrigated today- update [place] if any problems

## 2016-02-16 NOTE — Progress Notes (Signed)
Subjective:    Patient ID: Morgan Hubbard, female    DOB: 1934-06-07, 81 y.o.   MRN: [id number]  HPI Here with uri symptoms   Started getting sick on Sunday Dry cough- getting worse/ deep and can feel phlegm and cannot get is up  No fever  No nasal symptoms  Ears feel full-esp the R one (thinks it is wax and she has been using an otc product) Throat is ok   No wheezing or sob   Tried lemon juice/vinigar and honey  A little apple shine liquor  Tussin for diabetics   Feel generally weak and wiped out  Recently had all rxn to levemir -whelps Has one behind her L ear   Takes zyrtec   Patient Active Problem List   Diagnosis Date Noted  . Acute upper respiratory infection 02/16/2016  . Urticaria 11/03/2015  . Acute pharyngitis 08/28/2015  . Poorly controlled type 2 diabetes mellitus ([place]) 06/21/2015  . Cerumen impaction 03/10/2015  . Abdominal pain, epigastric 11/15/2014  . Low blood magnesium 11/04/2014  . Encounter for Medicare annual wellness exam 06/13/2014  . CAP (community acquired pneumonia) 06/06/2014  . Cough 05/31/2014  . Stroke, small vessel ([place]) 01/18/2014  . Family history of hemochromatosis 01/18/2014  . Cerebral thrombosis with cerebral infarction ([place]) 01/14/2014  . Expressive aphasia 01/13/2014  . Nonischemic cardiomyopathy ([place]) 04/05/2013  . Shortness of breath 09/03/2012  . Gallstones 05/26/2012  . Elevated transaminase level 05/18/2012  . Spinal stenosis of lumbar region 12/10/2011  . Mixed incontinence urge and stress 12/10/2011  . Renal insufficiency 11/04/2011  . Goiter 01/14/2011  . Tongue swelling 12/01/2010  . Fatty liver 08/28/2010  . DM 07/09/2010  . HYPERCHOLESTEROLEMIA 07/09/2010  . HYPERKALEMIA 07/09/2010  . REFLEX SYMPATHETIC DYSTROPHY 07/09/2010  . POLYNEUROPATHY 07/09/2010  . Essential hypertension 07/09/2010  . CARDIOMYOPATHY 07/09/2010  . OVERACTIVE BLADDER 07/09/2010   Past Medical History:  Diagnosis Date  . Arthritis      "hands" (01/13/2014)  . Basal cell carcinoma 01/2014   "bridge of nose"  . Cardiac LV ejection fraction >40%    "it was 43 last year" (01/13/2014)  . Chronic lower back pain   . Colon polyps   . Expressive aphasia    "3 times in the last week" (01/13/2014)  . GERD (gastroesophageal reflux disease)   . Goiter past remote   treated with RI  . Hyperlipidemia   . Hyperpotassemia   . Hypertension   . Hypertonicity of bladder   . Inflammatory and toxic neuropathy, unspecified   . LBBB (left bundle branch block)   . Migraine    "stopped many years ago" (01/13/2014)  . Mini stroke ([place]) 01/2014  . Other primary cardiomyopathies   . Overactive bladder   . Presence of combination internal cardiac defibrillator (ICD) and pacemaker   . Reflex sympathetic dystrophy, unspecified   . Shingles   . Shortness of breath    ambulation  . Stroke ([place])   . Thyroid disease   . Type II diabetes mellitus ([place])   . Ulcer   . Urine incontinence   . Vertigo    hx of   Past Surgical History:  Procedure Laterality Date  . BACK SURGERY    . BI-VENTRICULAR PACEMAKER INSERTION (CRT-P)  10/2014   [facility]  . BREAST CYST EXCISION Left 1959  . CARDIAC CATHETERIZATION  "several"   [facility]  . CATARACT EXTRACTION W/ INTRAOCULAR LENS IMPLANT Left ~ 2005   several eye injections  . COLONOSCOPY  7/12   normal (hx of polyps in past)   . CT SCAN  3/12   outside hosp- lung nodule and gallstones  . [place]   "knot on index"  . KIDNEY DONATION Left 1989  . LUMBAR LAMINECTOMY/DECOMPRESSION MICRODISCECTOMY  1999  . LUMBAR LAMINECTOMY/DECOMPRESSION MICRODISCECTOMY  01/31/2012   Procedure: LUMBAR LAMINECTOMY/DECOMPRESSION MICRODISCECTOMY 2 LEVELS;  Surgeon: Eustace Moore, MD;  Location: [facility];  Service: Neurosurgery;  Laterality: N/A;  Thoracic twelve-lumbar one, lumbar one-two laminectomy   . POSTERIOR LAMINECTOMY / DECOMPRESSION CERVICAL SPINE  1999  . TUBAL LIGATION  1976  . UPPER  GASTROINTESTINAL ENDOSCOPY     Social History  Substance Use Topics  . Smoking status: Never Smoker  . Smokeless tobacco: Never Used  . Alcohol use No   Family History  Problem Relation Age of Onset  . Arthritis Mother   . Cancer Mother     uterine and mouth  . Hyperlipidemia Mother   . Stroke Mother   . Hypertension Mother   . Alcohol abuse Father   . Diabetes Father   . Cancer Sister     breast  . Diabetes Sister   . Cancer Brother     lung cancer  . Kidney disease Brother   . Diabetes Brother   . Hyperlipidemia Sister   . Heart disease Sister   . Hypertension Sister   . Diabetes Sister   . Hyperlipidemia Brother   . Kidney failure Brother   . Diabetes Daughter    Allergies  Allergen Reactions  . Ace Inhibitors Swelling    REACTION: tongue swelling  . Codeine Other (See Comments)    REACTION: nausea  . Lisinopril Swelling    Swelling of tongue  . Nsaids   . Tylenol [Acetaminophen]    Current Outpatient Prescriptions on File Prior to Visit  Medication Sig Dispense Refill  . aspirin 325 MG tablet Take 325 mg by mouth daily.    Marland Kitchen atorvastatin (LIPITOR) 80 MG tablet Take 80 mg by mouth daily.    . carvedilol (COREG) 12.5 MG tablet Take 1 tablet (12.5 mg total) by mouth 2 (two) times daily with a meal. 180 tablet 4  . Cholecalciferol (VITAMIN D-3) 1000 units CAPS Take 1 capsule by mouth daily.    . clotrimazole-betamethasone (LOTRISONE) cream Apply 1 application topically as needed.    . gabapentin (NEURONTIN) 300 MG capsule TAKE 1 BY MOUTH 3 TIMES DAILY 270 capsule 3  . Glimepiride (AMARYL PO) Take 3 mg by mouth 2 (two) times daily.     . hydrALAZINE (APRESOLINE) 50 MG tablet Take 1 tablet by mouth 2 (two) times daily.    Marland Kitchen HYDROcodone-acetaminophen (NORCO/VICODIN) 5-325 MG tablet Take 1 tablet by mouth every 6 (six) hours as needed for severe pain. For pain 30 tablet 0  . isosorbide mononitrate (IMDUR) 30 MG 24 hr tablet Take 1 tablet (30 mg total) by mouth  daily. 90 tablet 4  . letrozole (FEMARA) 2.5 MG tablet Take 1 tablet by mouth daily.    Marland Kitchen losartan-hydrochlorothiazide (HYZAAR) 50-12.5 MG per tablet Take one-half tab once daily in the mornings.    . magnesium chloride (SLOW-MAG) 64 MG TBEC SR tablet Take 2 tablets (128 mg total) by mouth daily. 60 tablet 5  . metFORMIN (GLUCOPHAGE-XR) 500 MG 24 hr tablet Take 1,000 mg by mouth 2 (two) times daily.    Marland Kitchen MILK THISTLE PO Take 1 capsule by mouth 2 (two) times daily.     Marland Kitchen  mirabegron ER ([name]) 50 MG TB24 Take by mouth daily.    . nitroGLYCERIN (NITROSTAT) 0.4 MG SL tablet Place 0.4 mg under the tongue every 5 (five) minutes as needed for chest pain.    . pantoprazole (PROTONIX) 40 MG tablet TAKE ONE TABLET BY MOUTH ONCE DAILY 90 tablet 0  . vitamin B-12 (CYANOCOBALAMIN) 1000 MCG tablet Take 2,500 mcg by mouth daily.      No current facility-administered medications on file prior to visit.      Review of Systems  Constitutional: Positive for appetite change and fatigue. Negative for fever.  HENT: Positive for congestion, ear pain, hearing loss, postnasal drip, rhinorrhea, sinus pressure, sneezing and sore throat. Negative for ear discharge.   Eyes: Negative for pain and discharge.  Respiratory: Positive for cough. Negative for shortness of breath, wheezing and stridor.   Cardiovascular: Negative for chest pain.  Gastrointestinal: Negative for diarrhea, nausea and vomiting.  Genitourinary: Negative for frequency, hematuria and urgency.  Musculoskeletal: Negative for arthralgias and myalgias.  Skin: Negative for rash.  Neurological: Positive for headaches. Negative for dizziness, weakness and light-headedness.  Psychiatric/Behavioral: Negative for confusion and dysphoric mood.       Objective:   Physical Exam  Constitutional: She appears well-developed and well-nourished. No distress.  HENT:  Head: Normocephalic and atraumatic.  Left Ear: External ear normal.  Mouth/Throat:  Oropharynx is clear and moist.  Nares are injected and congested  No sinus tenderness Clear rhinorrhea and post nasal drip   Small amt of flaky cerumen in R ear   Eyes: Conjunctivae and EOM are normal. Pupils are equal, round, and reactive to light. Right eye exhibits no discharge. Left eye exhibits no discharge.  Neck: Normal range of motion. Neck supple.  Cardiovascular: Normal rate and normal heart sounds.   Pulmonary/Chest: Effort normal and breath sounds normal. No respiratory distress. She has no wheezes. She has no rales. She exhibits no tenderness.  Harsh bs  Some congestion worse on L that is cleared by cough  Upper airway noise diffusely No wheezing  Lymphadenopathy:    She has no cervical adenopathy.  Neurological: She is alert. No cranial nerve deficit. She exhibits normal muscle tone. Coordination normal.  Skin: Skin is warm and dry. No rash noted.  Small whelp behind L ear  No other skin change   Psychiatric: She has a normal mood and affect.          Assessment & Plan:   Problem List Items Addressed This Visit      Respiratory   Acute upper respiratory infection    In light of hx and malaise- cxr done today-pending rad rev Cover with zpak Tessalon prn Disc symptomatic care - see instructions on AVS  Update if not starting to improve in a week or if worsening        Relevant Medications   azithromycin (ZITHROMAX Z-PAK) 250 MG tablet   Other Relevant Orders   DG Chest 2 View (Completed)     Nervous and Auditory   Cerumen impaction - Primary    Small amt of flaky cerumen- removed with simple irrigation and curette Pt tolerated well with imp in hearing        Other Visit Diagnoses   None.

## 2016-02-18 NOTE — Assessment & Plan Note (Signed)
Small amt of flaky cerumen- removed with simple irrigation and curette Pt tolerated well with imp in hearing

## 2016-02-18 NOTE — Assessment & Plan Note (Signed)
In light of hx and malaise- cxr done today-pending rad rev Cover with zpak Tessalon prn Disc symptomatic care - see instructions on AVS  Update if not starting to improve in a week or if worsening

## 2016-02-23 ENCOUNTER — Telehealth: Payer: Self-pay

## 2016-02-23 DIAGNOSIS — E1165 Type 2 diabetes mellitus with hyperglycemia: Secondary | ICD-10-CM | POA: Diagnosis not present

## 2016-02-23 DIAGNOSIS — E1129 Type 2 diabetes mellitus with other diabetic kidney complication: Secondary | ICD-10-CM | POA: Diagnosis not present

## 2016-02-23 DIAGNOSIS — R809 Proteinuria, unspecified: Secondary | ICD-10-CM | POA: Diagnosis not present

## 2016-02-23 MED ORDER — AZITHROMYCIN 250 MG PO TABS: ORAL_TABLET | ORAL | 0 refills | Status: DC

## 2016-02-23 NOTE — Telephone Encounter (Signed)
Pt left v/m; pt was seen 02/16/16; pt request another rx called in to [facility] [address] Pt still has a lot of congestion and still coughing. I left v/m requesting pt to cb to get more info on cough and to ask what med pt needs. Left v/m requesting cb.

## 2016-02-23 NOTE — Telephone Encounter (Signed)
I sent it in- glad she is starting to improve but if she does not improve further please f/u

## 2016-02-23 NOTE — Telephone Encounter (Signed)
Pt notified Rx sent and to f/u if no improvement after this round of abx, pt verbalized understanding

## 2016-02-23 NOTE — Telephone Encounter (Signed)
Pt called back and pt thinks she needs more abx. To clear up the congestion and cough. Pt has benzonatate. Pt wants refill of zpak since it has helped but not clear. Pt still has non prod cough.pt request cb. [facility] [address]

## 2016-03-29 DIAGNOSIS — D2262 Melanocytic nevi of left upper limb, including shoulder: Secondary | ICD-10-CM | POA: Diagnosis not present

## 2016-03-29 DIAGNOSIS — D225 Melanocytic nevi of trunk: Secondary | ICD-10-CM | POA: Diagnosis not present

## 2016-03-29 DIAGNOSIS — L821 Other seborrheic keratosis: Secondary | ICD-10-CM | POA: Diagnosis not present

## 2016-03-29 DIAGNOSIS — Z85828 Personal history of other malignant neoplasm of skin: Secondary | ICD-10-CM | POA: Diagnosis not present

## 2016-04-03 ENCOUNTER — Ambulatory Visit (INDEPENDENT_AMBULATORY_CARE_PROVIDER_SITE_OTHER)
Admission: RE | Admit: 2016-04-03 | Discharge: 2016-04-03 | Disposition: A | Discharge status: Home / Self Care | Payer: Medicare HMO | Source: Ambulatory Visit | Attending: Family Medicine | Admitting: Family Medicine

## 2016-04-03 ENCOUNTER — Encounter: Payer: Self-pay | Admitting: Family Medicine

## 2016-04-03 ENCOUNTER — Ambulatory Visit (INDEPENDENT_AMBULATORY_CARE_PROVIDER_SITE_OTHER): Payer: Medicare HMO | Admitting: Family Medicine

## 2016-04-03 VITALS — BP 140/60 | HR 77 | Temp 98.0°F | Ht 62.0 in | Wt 153.8 lb

## 2016-04-03 DIAGNOSIS — M25552 Pain in left hip: Secondary | ICD-10-CM | POA: Diagnosis not present

## 2016-04-03 DIAGNOSIS — M67952 Unspecified disorder of synovium and tendon, left thigh: Secondary | ICD-10-CM | POA: Diagnosis not present

## 2016-04-03 DIAGNOSIS — Z23 Encounter for immunization: Secondary | ICD-10-CM | POA: Diagnosis not present

## 2016-04-03 DIAGNOSIS — M1612 Unilateral primary osteoarthritis, left hip: Secondary | ICD-10-CM | POA: Diagnosis not present

## 2016-04-03 MED ORDER — METHYLPREDNISOLONE ACETATE 40 MG/ML IJ SUSP
80.0000 mg | Freq: Once | INTRAMUSCULAR | Status: AC
  Administered 2016-04-03: 80 mg via INTRA_ARTICULAR

## 2016-04-03 NOTE — Progress Notes (Signed)
Dr. Frederico Hamman T. Soni Kegel, MD, [facility] [place], [address] Phone: [phone] Fax: [phone]  04/03/2016  Patient: [facility], MRN: [id number], DOB: Jan 02, 1935, 81 y.o.  Primary Physician:  [name], MD   Chief Complaint  Patient presents with  . Hip Pain    Left   Subjective:   [facility] is a 81 y.o. very pleasant female patient who presents with the following:  Left hip pain: thigh and ball and socket.   Patient has pain posteriorly in the posterior pelvis region, she also has some chronic back pain. She has had 2 lumbar spine surgeries previously. She also has some pain radiates in the posterior aspect of her thigh and into the back of her knee. She does have some long-standing numbness laterally on the left lower extremity. No recent trauma or injury.  There is some lateral pain, but more posterior on the pelvic rim.  Past Medical History, Surgical History, Social History, Family History, Problem List, Medications, and Allergies have been reviewed and updated if relevant.  Patient Active Problem List   Diagnosis Date Noted  . Acute upper respiratory infection 02/16/2016  . Urticaria 11/03/2015  . Acute pharyngitis 08/28/2015  . Poorly controlled type 2 diabetes mellitus ([place]) 06/21/2015  . Cerumen impaction 03/10/2015  . Abdominal pain, epigastric 11/15/2014  . Low blood magnesium 11/04/2014  . Encounter for Medicare annual wellness exam 06/13/2014  . CAP (community acquired pneumonia) 06/06/2014  . Cough 05/31/2014  . Stroke, small vessel ([place]) 01/18/2014  . Family history of hemochromatosis 01/18/2014  . Cerebral thrombosis with cerebral infarction ([place]) 01/14/2014  . Expressive aphasia 01/13/2014  . Nonischemic cardiomyopathy ([place]) 04/05/2013  . Shortness of breath 09/03/2012  . Gallstones 05/26/2012  . Elevated transaminase level 05/18/2012  . Spinal stenosis of lumbar region 12/10/2011    . Mixed incontinence urge and stress 12/10/2011  . Renal insufficiency 11/04/2011  . Goiter 01/14/2011  . Tongue swelling 12/01/2010  . Fatty liver 08/28/2010  . DM 07/09/2010  . HYPERCHOLESTEROLEMIA 07/09/2010  . HYPERKALEMIA 07/09/2010  . REFLEX SYMPATHETIC DYSTROPHY 07/09/2010  . POLYNEUROPATHY 07/09/2010  . Essential hypertension 07/09/2010  . CARDIOMYOPATHY 07/09/2010  . OVERACTIVE BLADDER 07/09/2010    Past Medical History:  Diagnosis Date  . Arthritis    "hands" (01/13/2014)  . Basal cell carcinoma 01/2014   "bridge of nose"  . Cardiac LV ejection fraction >40%    "it was 43 last year" (01/13/2014)  . Chronic lower back pain   . Colon polyps   . Expressive aphasia    "3 times in the last week" (01/13/2014)  . GERD (gastroesophageal reflux disease)   . Goiter past remote   treated with RI  . Hyperlipidemia   . Hyperpotassemia   . Hypertension   . Hypertonicity of bladder   . Inflammatory and toxic neuropathy, unspecified   . LBBB (left bundle branch block)   . Migraine    "stopped many years ago" (01/13/2014)  . Mini stroke ([place]) 01/2014  . Other primary cardiomyopathies   . Overactive bladder   . Presence of combination internal cardiac defibrillator (ICD) and pacemaker   . Reflex sympathetic dystrophy, unspecified   . Shingles   . Shortness of breath    ambulation  . Stroke ([place])   . Thyroid disease   . Type II diabetes mellitus ([place])   . Ulcer ([place])   . Urine incontinence   . Vertigo  hx of    Past Surgical History:  Procedure Laterality Date  . BACK SURGERY    . BI-VENTRICULAR PACEMAKER INSERTION (CRT-P)  10/2014   [facility]  . BREAST CYST EXCISION Left 1959  . CARDIAC CATHETERIZATION  "several"   [facility]  . CATARACT EXTRACTION W/ INTRAOCULAR LENS IMPLANT Left ~ 2005   several eye injections  . COLONOSCOPY  7/12   normal (hx of polyps in past)   . CT SCAN  3/12   outside hosp- lung nodule and gallstones  . [place]    "knot on index"  . KIDNEY DONATION Left 1989  . LUMBAR LAMINECTOMY/DECOMPRESSION MICRODISCECTOMY  1999  . LUMBAR LAMINECTOMY/DECOMPRESSION MICRODISCECTOMY  01/31/2012   Procedure: LUMBAR LAMINECTOMY/DECOMPRESSION MICRODISCECTOMY 2 LEVELS;  Surgeon: Eustace Moore, MD;  Location: [facility];  Service: Neurosurgery;  Laterality: N/A;  Thoracic twelve-lumbar one, lumbar one-two laminectomy   . POSTERIOR LAMINECTOMY / DECOMPRESSION CERVICAL SPINE  1999  . TUBAL LIGATION  1976  . UPPER GASTROINTESTINAL ENDOSCOPY      Social History   Social History  . Marital status: Married    Spouse name: N/A  . Number of children: 2  . Years of education: N/A   Occupational History  . retired - Production manager     Social History Main Topics  . Smoking status: Never Smoker  . Smokeless tobacco: Never Used  . Alcohol use No  . Drug use: No  . Sexual activity: Yes   Other Topics Concern  . Not on file   Social History Narrative   Married 2nd time   Daughter is [name]     Family History  Problem Relation Age of Onset  . Arthritis Mother   . Cancer Mother     uterine and mouth  . Hyperlipidemia Mother   . Stroke Mother   . Hypertension Mother   . Alcohol abuse Father   . Diabetes Father   . Cancer Sister     breast  . Diabetes Sister   . Cancer Brother     lung cancer  . Kidney disease Brother   . Diabetes Brother   . Hyperlipidemia Sister   . Heart disease Sister   . Hypertension Sister   . Diabetes Sister   . Hyperlipidemia Brother   . Kidney failure Brother   . Diabetes Daughter     Allergies  Allergen Reactions  . Ace Inhibitors Swelling    REACTION: tongue swelling  . Codeine Other (See Comments)    REACTION: nausea  . Insulin Detemir Itching  . Lisinopril Swelling    Swelling of tongue  . Nsaids   . Tylenol [Acetaminophen]     Medication list reviewed and updated in full in [place].  GEN: No fevers, chills. Nontoxic. Primarily MSK c/o  today. MSK: Detailed in the HPI GI: tolerating PO intake without difficulty Neuro: No numbness, parasthesias, or tingling associated. Otherwise the pertinent positives of the ROS are noted above.   Objective:   BP 140/60   Pulse 77   Temp 98 F (36.7 C) (Oral)   Ht 5\' 2"  (1.575 m)   Wt 153 lb 12 oz (69.7 kg)   LMP 06/03/1992   BMI 28.12 kg/m    GEN: WDWN, NAD, Non-toxic, Alert & Oriented x 3 HEENT: Atraumatic, Normocephalic.  Ears and Nose: No external deformity. EXTR: No clubbing/cyanosis/edema NEURO: Normal gait.  PSYCH: Normally interactive. Conversant. Not depressed or anxious appearing.  Calm demeanor.  HIP EXAM: SIDE: L ROM: Abduction, Flexion, Internal and External range of motion: Mild loss of motion, approximately 15 internal and external range of motion Pain with terminal IROM and EROM: Minimal GTB: NT Patient does have pain along the left-sided pelvic rim at the gluteus minimus insertion as well as the top of the femur SLR: NEG Knees: No effusion FABER: NT REVERSE FABER: NT, neg Piriformis: NT at direct palpation Str: flexion: 4+/5 abduction: 4+/5 adduction: 5/5 Strength testing non-tender   Radiology: Dg Hip Unilat With Pelvis 2-3 Views Left  Result Date: 04/03/2016 CLINICAL DATA:  Pain EXAM: DG HIP (WITH OR WITHOUT PELVIS) 2-3V LEFT COMPARISON:  None. FINDINGS: Weightbearing frontal pelvis as well as weightbearing frontal and lateral left hip images were obtained. There is no fracture or dislocation. There is moderate symmetric narrowing of both hip joints. No erosive change. There is lumbar scoliosis with extensive arthropathy in the visualized portions of the lumbar spine. IMPRESSION: Osteoarthritic change in both hip joints, moderate in severity and symmetric. Scoliosis and degenerative type change throughout the visualized lumbar spine. No acute fracture or dislocation. Electronically Signed   By: Lowella Grip III M.D.   On: 04/03/2016 11:00     The radiological images were independently reviewed by myself in the office and results were reviewed with the patient. My independent interpretation of images:  I would term mild osteoarthritic changes without advanced changes at all present. It does appear to be symmetric in character. No fracture or dislocation is visualized. Electronically Signed  By: [name], MD On: 04/03/2016 1:32 PM   Assessment and Plan:   Tendinopathy of left gluteus medius  Left hip pain - Plan: DG HIP UNILAT WITH PELVIS 2-3 VIEWS LEFT, methylPREDNISolone acetate (DEPO-MEDROL) injection 80 mg  Need for prophylactic vaccination and inoculation against influenza - Plan: Flu Vaccine QUAD 36+ mos IM  Chronic back pain, failed spine syndrome in a patient who has mild to moderate osteoarthritis of the left hip. I think that the primary driver is coming from her back, and she has some secondary tendinopathy in the gluteus minimus. An gluteus medius.  A rehabilitation program from the [employer] was reviewed with the patient face to face for their condition.   L posterior hip Injection Verbal consent obtained. Risks (including infection, potential atrophy), benefits, and alternatives reviewed. Area sterilely prepped with Chloraprep. Ethyl Chloride used for anesthesia. 8 cc of Lidocaine 1% injected with 2 mL of Depo-Medrol 40 mg into posterior hip and the region of the pelvic rim near gluteus minimus and medius insertion as well as at the top of the true hip joint near insertion in this region.  Needle taken to bone, flows easily. No bleeding and no complications. Decreased pain after injection. Needle: 22 gauge spinal needle   Follow-up: No Follow-up on file.  Orders Placed This Encounter  Procedures  . DG HIP UNILAT WITH PELVIS 2-3 VIEWS LEFT  . Flu Vaccine QUAD 36+ mos IM    Signed,  Kaleo Condrey T. Laren Orama, MD   Patient's Medications  New Prescriptions   No medications on file   Previous Medications   ASPIRIN 325 MG TABLET    Take 325 mg by mouth daily.   ATORVASTATIN (LIPITOR) 80 MG TABLET    Take 80 mg by mouth daily.   CARVEDILOL (COREG) 12.5 MG TABLET    Take 1 tablet (12.5 mg total) by mouth 2 (two) times daily with a meal.   CHOLECALCIFEROL (VITAMIN D-3) 1000 UNITS CAPS  Take 1 capsule by mouth daily.   CLOTRIMAZOLE-BETAMETHASONE (LOTRISONE) CREAM    Apply 1 application topically as needed.   GABAPENTIN (NEURONTIN) 300 MG CAPSULE    TAKE 1 BY MOUTH 3 TIMES DAILY   GLIMEPIRIDE (AMARYL PO)    Take 3 mg by mouth 2 (two) times daily.    HYDRALAZINE (APRESOLINE) 50 MG TABLET    Take 1 tablet by mouth 2 (two) times daily.   HYDROCODONE-ACETAMINOPHEN (NORCO/VICODIN) 5-325 MG TABLET    Take 1 tablet by mouth every 6 (six) hours as needed for severe pain. For pain   INSULIN DEGLUDEC (TRESIBA) 100 UNIT/ML SOPN FLEXTOUCH PEN    Inject 10 Units into the skin daily after breakfast.    ISOSORBIDE MONONITRATE (IMDUR) 30 MG 24 HR TABLET    Take 1 tablet (30 mg total) by mouth daily.   LETROZOLE (FEMARA) 2.5 MG TABLET    Take 1 tablet by mouth daily.   LOSARTAN-HYDROCHLOROTHIAZIDE (HYZAAR) 50-12.5 MG PER TABLET    Take one-half tab once daily in the mornings.   MAGNESIUM CHLORIDE (SLOW-MAG) 64 MG TBEC SR TABLET    Take 2 tablets (128 mg total) by mouth daily.   METFORMIN (GLUCOPHAGE-XR) 500 MG 24 HR TABLET    Take 1,000 mg by mouth 2 (two) times daily.   MILK THISTLE PO    Take 1 capsule by mouth 2 (two) times daily.    MIRABEGRON ER ([name]) 50 MG TB24    Take by mouth daily.   NITROGLYCERIN (NITROSTAT) 0.4 MG SL TABLET    Place 0.4 mg under the tongue every 5 (five) minutes as needed for chest pain.   PANTOPRAZOLE (PROTONIX) 40 MG TABLET    TAKE ONE TABLET BY MOUTH ONCE DAILY   VITAMIN B-12 (CYANOCOBALAMIN) 1000 MCG TABLET    Take 2,500 mcg by mouth daily.   Modified Medications   No medications on file  Discontinued Medications   AZITHROMYCIN (ZITHROMAX Z-PAK) 250 MG  TABLET    Take 2 pills by mouth today and then 1 pill daily for 4 days   BENZONATATE (TESSALON) 200 MG CAPSULE    Take 1 capsule (200 mg total) by mouth 3 (three) times daily as needed. Do not bite or chew pill

## 2016-04-03 NOTE — Progress Notes (Signed)
Pre visit review using our clinic review tool, if applicable. No additional management support is needed unless otherwise documented below in the visit note. 

## 2016-04-05 ENCOUNTER — Encounter: Payer: Self-pay | Admitting: Family Medicine

## 2016-04-08 ENCOUNTER — Telehealth: Payer: Self-pay | Admitting: Family Medicine

## 2016-04-08 ENCOUNTER — Encounter: Payer: Self-pay | Admitting: *Deleted

## 2016-04-08 ENCOUNTER — Encounter: Payer: Self-pay | Admitting: Family Medicine

## 2016-04-08 MED ORDER — HYDROCODONE-ACETAMINOPHEN 5-325 MG PO TABS: 1.0000 | ORAL_TABLET | Freq: Four times a day (QID) | ORAL | 0 refills | Status: DC | PRN

## 2016-04-08 NOTE — Telephone Encounter (Signed)
Left voicemail letting pt know Rx ready for pick up and it's time to do the yearly assured Tox screen

## 2016-04-08 NOTE — Telephone Encounter (Signed)
Please let pt know I refilled her vicodin and it is ready to be picked up/in IN box thanks

## 2016-04-15 ENCOUNTER — Other Ambulatory Visit: Payer: Self-pay | Admitting: Family Medicine

## 2016-04-15 ENCOUNTER — Encounter: Payer: Self-pay | Admitting: Cardiovascular Disease

## 2016-04-15 ENCOUNTER — Ambulatory Visit (INDEPENDENT_AMBULATORY_CARE_PROVIDER_SITE_OTHER): Payer: Medicare HMO | Admitting: Cardiovascular Disease

## 2016-04-15 VITALS — BP 140/58 | HR 73 | Ht 62.0 in | Wt 153.2 lb

## 2016-04-15 DIAGNOSIS — E1165 Type 2 diabetes mellitus with hyperglycemia: Secondary | ICD-10-CM

## 2016-04-15 DIAGNOSIS — R0602 Shortness of breath: Secondary | ICD-10-CM

## 2016-04-15 DIAGNOSIS — I428 Other cardiomyopathies: Secondary | ICD-10-CM | POA: Diagnosis not present

## 2016-04-15 DIAGNOSIS — I10 Essential (primary) hypertension: Secondary | ICD-10-CM

## 2016-04-15 DIAGNOSIS — E7800 Pure hypercholesterolemia, unspecified: Secondary | ICD-10-CM | POA: Diagnosis not present

## 2016-04-15 NOTE — Telephone Encounter (Signed)
Please schedule annual exam in spring with [facility] prior (if she does not want to do that, just a 30 min f/u) and refill until then Thanks

## 2016-04-15 NOTE — Telephone Encounter (Signed)
Pt has had a few recent acute appts with you, last filled on 02/07/15 #[id number] with 3 additional refills, please advise

## 2016-04-15 NOTE — Patient Instructions (Signed)

## 2016-04-15 NOTE — Progress Notes (Signed)
Cardiology Office Note  Date:  04/15/2016   ID:  Morgan Hubbard, DOB March 16, 1935, MRN [id number]  PCP:  Morgan Pardon, MD   Chief Complaint  Patient presents with  . OTHER    6 month f/u c/o sob with exertion. Meds reviewed verbally with pt.    HPI:  Morgan Hubbard is a very pleasant 81 year old woman with past medical history of nonischemic cardiomyopathy, prior ejection fraction of 63% in 1497 of uncertain etiology ,  history of left bundle branch block, nephrectomy/donated kidney in 1989, possible angioedema on lisinopril, back surgery x3, poorly controlled diabetes  Hemoglobin A1c 8, previously seen for shortness of breath. She presents for routine followup  Of her cardiomyopathy Ejection fraction of 15-20% in 2015  In follow-up today she reports that she is stable, but does have chronic SOB with exertion  No regular exercise program , travels, goes to the beach , owns a house there Deconditioned at baseline Denies any leg swelling, abdominal bloating, cough, PND or orthopnea Does not take Lasix, inconvenience of going to the bathroom Weight is up 5 pounds in the past several months  TIA/stroke August 2015.  had difficulty speaking,  Walking  Initially placed on aspirin, Plavix, been changed by neurology to aspirin 325 mg daily , Plavix held She continues to follow-up at [facility]   with cardiology and EP  Biventricular ICD placed May 2016 for shortness of breath symptoms placed by  Dr. Beather Hubbard   echocardiogram from August 2016 reviewed with her from [facility] showing ejection fraction 35% , no significant valve disease   recent echocardiogram performed at [facility] from 2017 showing ejection fraction 40%  Previous hemoglobin A1c 8.1,  managed by endocrinology  Previously had low magnesium 1.3, was taking supplement, no longer taking supplement as this ran out.  Total cholesterol general 2016 was 86 , takes Lipitor 80 mg daily   other past medical history   History ofsevere back  disease she has significant neuropathy in her legs.   Echocardiogram was done for her fatigue and shortness of breath.  ejection fraction of 25-30% which was significant drop from prior ejection fraction February 2013 (45%) done at [facility].  normal RVSP.  Prior cardiac catheterization 08/30/2010 in [facility]  showed no significant coronary artery disease   PMH:   has a past medical history of Arthritis; Basal cell carcinoma (01/2014); Cardiac LV ejection fraction >40%; Chronic lower back pain; Colon polyps; Expressive aphasia; GERD (gastroesophageal reflux disease); Goiter (past remote); Hyperlipidemia; Hyperpotassemia; Hypertension; Hypertonicity of bladder; Inflammatory and toxic neuropathy, unspecified; LBBB (left bundle branch block); Migraine; Mini stroke ([facility]) (01/2014); Other primary cardiomyopathies; Overactive bladder; Presence of combination internal cardiac defibrillator (ICD) and pacemaker; Reflex sympathetic dystrophy, unspecified; Shingles; Shortness of breath; Stroke (M[facility]); Thyroid disease; Type II diabetes mellitus (Morgan Hubbard); Ulcer (M[facility]); Urine incontinence; and Vertigo.  PSH:    Past Surgical History:  Procedure Laterality Date  . BACK SURGERY    . BI-VENTRICULAR PACEMAKER INSERTION (CRT-P)  10/2014   [facility]  . BREAST CYST EXCISION Left 1959  . CARDIAC CATHETERIZATION  "several"   [facility]  . CATARACT EXTRACTION W/ INTRAOCULAR LENS IMPLANT Left ~ 2005   several eye injections  . COLONOSCOPY  7/12   normal (hx of polyps in past)   . CT SCAN  3/12   outside hosp- lung nodule and gallstones  . [place]   "knot on index"  . KIDNEY DONATION Left 1989  . LUMBAR LAMINECTOMY/DECOMPRESSION MICRODISCECTOMY  1999  . LUMBAR LAMINECTOMY/DECOMPRESSION MICRODISCECTOMY  01/31/2012   Procedure: LUMBAR LAMINECTOMY/DECOMPRESSION MICRODISCECTOMY 2 LEVELS;  Surgeon: Morgan Moore, MD;  Location: [facility];  Service: Neurosurgery;  Laterality: N/A;  Thoracic  twelve-lumbar one, lumbar one-two laminectomy   . POSTERIOR LAMINECTOMY / DECOMPRESSION CERVICAL SPINE  1999  . TUBAL LIGATION  1976  . UPPER GASTROINTESTINAL ENDOSCOPY      Current Outpatient Prescriptions  Medication Sig Dispense Refill  . aspirin 325 MG tablet Take 325 mg by mouth daily.    Marland Kitchen atorvastatin (LIPITOR) 80 MG tablet Take 80 mg by mouth daily.    . carvedilol (COREG) 12.5 MG tablet Take 1 tablet (12.5 mg total) by mouth 2 (two) times daily with a meal. 180 tablet 4  . Cholecalciferol (VITAMIN D-3) 1000 units CAPS Take 1 capsule by mouth daily.    . clotrimazole-betamethasone (LOTRISONE) cream Apply 1 application topically as needed.    . gabapentin (NEURONTIN) 300 MG capsule TAKE 1 BY MOUTH 3 TIMES DAILY 270 capsule 3  . Glimepiride (AMARYL PO) Take 3 mg by mouth 2 (two) times daily.     . hydrALAZINE (APRESOLINE) 50 MG tablet Take 1 tablet by mouth 2 (two) times daily.    Marland Kitchen HYDROcodone-acetaminophen (NORCO/VICODIN) 5-325 MG tablet Take 1 tablet by mouth every 6 (six) hours as needed for severe pain. For pain 30 tablet 0  . insulin degludec (TRESIBA) 100 UNIT/ML SOPN FlexTouch Pen Inject 10 Units into the skin daily after breakfast.     . isosorbide mononitrate (IMDUR) 30 MG 24 hr tablet Take 1 tablet (30 mg total) by mouth daily. 90 tablet 4  . letrozole (FEMARA) 2.5 MG tablet Take 1 tablet by mouth daily.    Marland Kitchen losartan-hydrochlorothiazide (HYZAAR) 50-12.5 MG per tablet Take one-half tab once daily in the mornings.    . metFORMIN (GLUCOPHAGE-XR) 500 MG 24 hr tablet Take 1,000 mg by mouth 2 (two) times daily.    Marland Kitchen MILK THISTLE PO Take 1 capsule by mouth 2 (two) times daily.     . mirabegron ER ([name]) 50 MG TB24 Take by mouth daily.    . nitroGLYCERIN (NITROSTAT) 0.4 MG SL tablet Place 0.4 mg under the tongue every 5 (five) minutes as needed for chest pain.    . pantoprazole (PROTONIX) 40 MG tablet TAKE ONE TABLET BY MOUTH ONCE DAILY 90 tablet 0  . vitamin B-12  (CYANOCOBALAMIN) 1000 MCG tablet Take 2,500 mcg by mouth daily.      No current facility-administered medications for this visit.      Allergies:   Ace inhibitors; Codeine; Insulin detemir; Lisinopril; Nsaids; and Tylenol [acetaminophen]   Social History:  The patient  reports that she has never smoked. She has never used smokeless tobacco. She reports that she does not drink alcohol or use drugs.   Family History:   family history includes Alcohol abuse in her father; Arthritis in her mother; Cancer in her brother, mother, and sister; Diabetes in her brother, daughter, father, sister, and sister; Heart disease in her sister; Hyperlipidemia in her brother, mother, and sister; Hypertension in her mother and sister; Kidney disease in her brother; Kidney failure in her brother; Stroke in her mother.    Review of Systems: Review of Systems  Constitutional: Negative.   Respiratory: Positive for shortness of breath.   Cardiovascular: Negative.   Gastrointestinal: Negative.   Musculoskeletal: Negative.   Neurological: Negative.   Psychiatric/Behavioral: Negative.   All other systems reviewed and are negative.  PHYSICAL EXAM: VS:  BP (!) 140/58 (BP Location: Left Arm, Patient Position: Sitting, Cuff Size: Normal)   Pulse 73   Ht 5\' 2"  (1.575 m)   Wt 153 lb 4 oz (69.5 kg)   LMP 06/03/1992   BMI 28.03 kg/m  , BMI Body mass index is 28.03 kg/m. GEN: Well nourished, well developed, in no acute distress , obese HEENT: normal  Neck: no JVD, carotid bruits, or masses Cardiac: RRR; no murmurs, rubs, or gallops,no edema  Respiratory:  clear to auscultation bilaterally, normal work of breathing GI: soft, nontender, nondistended, + BS MS: no deformity or atrophy  Skin: warm and dry, no rash Neuro:  Strength and sensation are intact Psych: euthymic mood, full affect    Recent Labs: No results found for requested labs within last 8760 hours.    Lipid Panel Lab Results  Component  Value Date   CHOL 86 06/06/2014   HDL 24.00 (L) 06/06/2014   LDLCALC 39 06/06/2014   TRIG 114.0 06/06/2014      Wt Readings from Last 3 Encounters:  04/15/16 153 lb 4 oz (69.5 kg)  04/03/16 153 lb 12 oz (69.7 kg)  02/16/16 148 lb 4 oz (67.2 kg)       ASSESSMENT AND PLAN:  Nonischemic cardiomyopathy ([place]) - Plan: EKG 12-Lead Ejection fraction up to 40% No medication changes. We did discuss entresto with her. She prefers to continue current medications. Unclear if she would qualify given EF estimated slightly above 40  Essential hypertension - Plan: EKG 12-Lead Blood pressure is well controlled on today's visit. No changes made to the medications.  HYPERCHOLESTEROLEMIA Cholesterol is at goal on the current lipid regimen. No changes to the medications were made.  Poorly controlled type 2 diabetes mellitus ([place]) Long history of poor control diabetes  Shortness of breath Long discussion with her concerning her shortness of breath symptoms  appears euvolemic on today's visit despite weight gain 5 pounds Blood pressure well controlled Major issue is deconditioning, obesity Phone calls made to the hospital to inquire about the "forever fit" Pricing information provided to her Recommended she join with regular exercise regimen, perhaps going with her husband   Total encounter time more than 25 minutes  Greater than 50% was spent in counseling and coordination of care with the patient    Disposition:   F/U  12 months   Orders Placed This Encounter  Procedures  . EKG 12-Lead     Signed, Esmond Plants, M.D., Ph.D. 04/15/2016  [place], [place]

## 2016-04-16 DIAGNOSIS — Z4502 Encounter for adjustment and management of automatic implantable cardiac defibrillator: Secondary | ICD-10-CM | POA: Diagnosis not present

## 2016-04-16 DIAGNOSIS — Z95810 Presence of automatic (implantable) cardiac defibrillator: Secondary | ICD-10-CM | POA: Diagnosis not present

## 2016-04-16 NOTE — Telephone Encounter (Signed)
Med refilled and info given to Lattie Haw to schedule AWV with Dr. [name]

## 2016-04-22 ENCOUNTER — Ambulatory Visit (INDEPENDENT_AMBULATORY_CARE_PROVIDER_SITE_OTHER): Payer: Medicare HMO

## 2016-04-22 VITALS — BP 126/64 | HR 66 | Temp 98.8°F | Ht 60.25 in | Wt 152.0 lb

## 2016-04-22 DIAGNOSIS — Z0000 Encounter for general adult medical examination without abnormal findings: Secondary | ICD-10-CM

## 2016-04-22 DIAGNOSIS — Z905 Acquired absence of kidney: Secondary | ICD-10-CM | POA: Diagnosis not present

## 2016-04-22 DIAGNOSIS — R7989 Other specified abnormal findings of blood chemistry: Secondary | ICD-10-CM | POA: Diagnosis not present

## 2016-04-22 DIAGNOSIS — I10 Essential (primary) hypertension: Secondary | ICD-10-CM | POA: Diagnosis not present

## 2016-04-22 DIAGNOSIS — E1165 Type 2 diabetes mellitus with hyperglycemia: Secondary | ICD-10-CM

## 2016-04-22 DIAGNOSIS — E7800 Pure hypercholesterolemia, unspecified: Secondary | ICD-10-CM | POA: Diagnosis not present

## 2016-04-22 LAB — CBC WITH DIFFERENTIAL/PLATELET
BASOS ABS: 0 10*3/uL (ref 0.0–0.1)
Basophils Relative: 0.2 % (ref 0.0–3.0)
Eosinophils Absolute: 0.3 10*3/uL (ref 0.0–0.7)
Eosinophils Relative: 4.5 % (ref 0.0–5.0)
HEMATOCRIT: 39.6 % (ref 36.0–46.0)
Hemoglobin: 13.2 g/dL (ref 12.0–15.0)
LYMPHS PCT: 28.6 % (ref 12.0–46.0)
Lymphs Abs: 2 10*3/uL (ref 0.7–4.0)
MCHC: 33.3 g/dL (ref 30.0–36.0)
MCV: 89.3 fl (ref 78.0–100.0)
Monocytes Absolute: 0.5 10*3/uL (ref 0.1–1.0)
Monocytes Relative: 6.7 % (ref 3.0–12.0)
NEUTROS PCT: 60 % (ref 43.0–77.0)
Neutro Abs: 4.2 10*3/uL (ref 1.4–7.7)
Platelets: 181 10*3/uL (ref 150.0–400.0)
RBC: 4.43 Mil/uL (ref 3.87–5.11)
RDW: 13.5 % (ref 11.5–15.5)
WBC: 7.1 10*3/uL (ref 4.0–10.5)

## 2016-04-22 LAB — LIPID PANEL
Cholesterol: 146 mg/dL (ref 0–200)
HDL: 42.7 mg/dL (ref >39.00)
NonHDL: 103.36
Total CHOL/HDL Ratio: 3
Triglycerides: 219 mg/dL — ABNORMAL HIGH (ref 0.0–149.0)
VLDL: 43.8 mg/dL — ABNORMAL HIGH (ref 0.0–40.0)

## 2016-04-22 LAB — MICROALBUMIN / CREATININE URINE RATIO
CREATININE, U: 123.4 mg/dL
MICROALB UR: 78 mg/dL — AB (ref 0.0–1.9)
Microalb Creat Ratio: 63.2 mg/g — ABNORMAL HIGH (ref 0.0–30.0)

## 2016-04-22 LAB — COMPREHENSIVE METABOLIC PANEL
ALK PHOS: 75 U/L (ref 39–117)
ALT: 22 U/L (ref 0–35)
AST: 19 U/L (ref 0–37)
Albumin: 3.4 g/dL — ABNORMAL LOW (ref 3.5–5.2)
BILIRUBIN TOTAL: 0.5 mg/dL (ref 0.2–1.2)
BUN: 21 mg/dL (ref 6–23)
CALCIUM: 9.2 mg/dL (ref 8.4–10.5)
CO2: 31 mEq/L (ref 19–32)
CREATININE: 1.07 mg/dL (ref 0.40–1.20)
Chloride: 104 mEq/L (ref 96–112)
GFR: 52.53 mL/min — ABNORMAL LOW (ref >60.00)
GLUCOSE: 337 mg/dL — AB (ref 70–99)
Potassium: 5.3 mEq/L — ABNORMAL HIGH (ref 3.5–5.1)
Sodium: 141 mEq/L (ref 135–145)
TOTAL PROTEIN: 5.8 g/dL — AB (ref 6.0–8.3)

## 2016-04-22 LAB — MAGNESIUM: MAGNESIUM: 1.5 mg/dL (ref 1.5–2.5)

## 2016-04-22 LAB — HEMOGLOBIN A1C: Hgb A1c MFr Bld: 9 % — ABNORMAL HIGH (ref 4.6–6.5)

## 2016-04-22 LAB — TSH: TSH: 0.82 u[IU]/mL (ref 0.35–4.50)

## 2016-04-22 LAB — LDL CHOLESTEROL, DIRECT: Direct LDL: 77 mg/dL

## 2016-04-22 NOTE — Patient Instructions (Signed)
Morgan Hubbard , Thank you for taking time to come for your Medicare Wellness Visit. I appreciate your ongoing commitment to your health goals. Please review the following plan we discussed and let me know if I can assist you in the future.   These are the goals we discussed: Goals    . Increase water intake          Starting 04/22/2016, I will attempt to drink at least 8 oz of water with each meal daily.        This is a list of the screening recommended for you and due dates:  Health Maintenance  Topic Date Due  . Complete foot exam   06/12/2016*  . Eye exam for diabetics  04/22/2017*  . DEXA scan (bone density measurement)  04/22/2017*  . Tetanus Vaccine  04/22/2026*  . Shingles Vaccine  04/22/2029*  . Mammogram  08/16/2016  . Hemoglobin A1C  10/20/2016  . Flu Shot  Completed  . Pneumonia vaccines  Completed  *Topic was postponed. The date shown is not the original due date.   Preventive Care for Adults  A healthy lifestyle and preventive care can promote health and wellness. Preventive health guidelines for adults include the following key practices.  . A routine yearly physical is a good way to check with your health care provider about your health and preventive screening. It is a chance to share any concerns and updates on your health and to receive a thorough exam.  . Visit your dentist for a routine exam and preventive care every 6 months. Brush your teeth twice a day and floss once a day. Good oral hygiene prevents tooth decay and gum disease.  . The frequency of eye exams is based on your age, health, family medical history, use  of contact lenses, and other factors. Follow your health care provider's ecommendations for frequency of eye exams.  . Eat a healthy diet. Foods like vegetables, fruits, whole grains, low-fat dairy products, and lean protein foods contain the nutrients you need without too many calories. Decrease your intake of foods high in solid fats, added  sugars, and salt. Eat the right amount of calories for you. Get information about a proper diet from your health care provider, if necessary.  . Regular physical exercise is one of the most important things you can do for your health. Most adults should get at least 150 minutes of moderate-intensity exercise (any activity that increases your heart rate and causes you to sweat) each week. In addition, most adults need muscle-strengthening exercises on 2 or more days a week.  Silver Sneakers may be a benefit available to you. To determine eligibility, you may visit the website: www.silversneakers.com or contact program at 9251147023 Mon-Fri between 8AM-8PM.   . Maintain a healthy weight. The body mass index (BMI) is a screening tool to identify possible weight problems. It provides an estimate of body fat based on height and weight. Your health care provider can find your BMI and can help you achieve or maintain a healthy weight.   For adults 20 years and older: ? A BMI below 18.5 is considered underweight. ? A BMI of 18.5 to 24.9 is normal. ? A BMI of 25 to 29.9 is considered overweight. ? A BMI of 30 and above is considered obese.   . Maintain normal blood lipids and cholesterol levels by exercising and minimizing your intake of saturated fat. Eat a balanced diet with plenty of fruit and vegetables. Blood tests for  lipids and cholesterol should begin at age 58 and be repeated every 5 years. If your lipid or cholesterol levels are high, you are over 50, or you are at high risk for heart disease, you may need your cholesterol levels checked more frequently. Ongoing high lipid and cholesterol levels should be treated with medicines if diet and exercise are not working.  . If you smoke, find out from your health care provider how to quit. If you do not use tobacco, please do not start.  . If you choose to drink alcohol, please do not consume more than 2 drinks per day. One drink is considered to  be 12 ounces (355 mL) of beer, 5 ounces (148 mL) of wine, or 1.5 ounces (44 mL) of liquor.  . If you are 42-47 years old, ask your health care provider if you should take aspirin to prevent strokes.  . Use sunscreen. Apply sunscreen liberally and repeatedly throughout the day. You should seek shade when your shadow is shorter than you. Protect yourself by wearing long sleeves, pants, a wide-brimmed hat, and sunglasses year round, whenever you are outdoors.  . Once a month, do a whole body skin exam, using a mirror to look at the skin on your back. Tell your health care provider of new moles, moles that have irregular borders, moles that are larger than a pencil eraser, or moles that have changed in shape or color.

## 2016-04-22 NOTE — Progress Notes (Signed)
Pre visit review using our clinic review tool, if applicable. No additional management support is needed unless otherwise documented below in the visit note. 

## 2016-04-22 NOTE — Progress Notes (Signed)
PCP notes:   Health maintenance:  Foot exam - per pt, this is done by endocrinologist Eye exam - per pt, appt scheduled December 2017 Bone density - per pt, she will discuss with PCP Tetanus - per pt, she needs to check records for date of last vaccine Shingles - pt declined A1C - completed  Abnormal screenings:   Hearing - failed  Patient concerns:   None  Nurse concerns:  None  Next PCP appt:   04/24/16 @ 1215  I reviewed health advisor's note, was available for consultation, and agree with documentation and plan. [name] MD

## 2016-04-22 NOTE — Progress Notes (Signed)
Subjective:   Morgan Hubbard is a 81 y.o. female who presents for Medicare Annual (Subsequent) preventive examination.  Review of Systems:  N/A Cardiac Risk Factors include: advanced age (>68men, >79 women);diabetes mellitus;dyslipidemia;hypertension     Objective:     Vitals: BP 126/64 (BP Location: Left Arm, Patient Position: Sitting, Cuff Size: Normal)   Pulse 66   Temp 98.8 F (37.1 C) (Oral)   Ht 5' 0.25" (1.53 m) Comment: no shoes  Wt 152 lb (68.9 kg)   LMP 06/03/1992   SpO2 96%   BMI 29.44 kg/m   Body mass index is 29.44 kg/m.   Tobacco History  Smoking Status  . Never Smoker  Smokeless Tobacco  . Never Used     Counseling given: Not Answered   Past Medical History:  Diagnosis Date  . Arthritis    "hands" (01/13/2014)  . Basal cell carcinoma 01/2014   "bridge of nose"  . Cardiac LV ejection fraction >40%    "it was 43 last year" (01/13/2014)  . Chronic lower back pain   . Colon polyps   . Expressive aphasia    "3 times in the last week" (01/13/2014)  . GERD (gastroesophageal reflux disease)   . Goiter past remote   treated with RI  . Hyperlipidemia   . Hyperpotassemia   . Hypertension   . Hypertonicity of bladder   . Inflammatory and toxic neuropathy, unspecified   . LBBB (left bundle branch block)   . Migraine    "stopped many years ago" (01/13/2014)  . Mini stroke ([place]) 01/2014  . Other primary cardiomyopathies   . Overactive bladder   . Presence of combination internal cardiac defibrillator (ICD) and pacemaker   . Reflex sympathetic dystrophy, unspecified   . Shingles   . Shortness of breath    ambulation  . Stroke ([place])   . Thyroid disease   . Type II diabetes mellitus ([place])   . Ulcer ([place])   . Urine incontinence   . Vertigo    hx of   Past Surgical History:  Procedure Laterality Date  . BACK SURGERY    . BI-VENTRICULAR PACEMAKER INSERTION (CRT-P)  10/2014   [facility]  . BREAST CYST EXCISION Left 1959  . CARDIAC CATHETERIZATION   "several"   [facility]  . CATARACT EXTRACTION W/ INTRAOCULAR LENS IMPLANT Left ~ 2005   several eye injections  . COLONOSCOPY  7/12   normal (hx of polyps in past)   . CT SCAN  3/12   outside hosp- lung nodule and gallstones  . [place]   "knot on index"  . KIDNEY DONATION Left 1989  . LUMBAR LAMINECTOMY/DECOMPRESSION MICRODISCECTOMY  1999  . LUMBAR LAMINECTOMY/DECOMPRESSION MICRODISCECTOMY  01/31/2012   Procedure: LUMBAR LAMINECTOMY/DECOMPRESSION MICRODISCECTOMY 2 LEVELS;  Surgeon: Eustace Moore, MD;  Location: [facility];  Service: Neurosurgery;  Laterality: N/A;  Thoracic twelve-lumbar one, lumbar one-two laminectomy   . POSTERIOR LAMINECTOMY / DECOMPRESSION CERVICAL SPINE  1999  . TUBAL LIGATION  1976  . UPPER GASTROINTESTINAL ENDOSCOPY     Family History  Problem Relation Age of Onset  . Arthritis Mother   . Cancer Mother     uterine and mouth  . Hyperlipidemia Mother   . Stroke Mother   . Hypertension Mother   . Alcohol abuse Father   . Diabetes Father   . Cancer Sister     breast  . Diabetes Sister   . Cancer Brother     lung  cancer  . Kidney disease Brother   . Diabetes Brother   . Hyperlipidemia Sister   . Heart disease Sister   . Hypertension Sister   . Diabetes Sister   . Hyperlipidemia Brother   . Kidney failure Brother   . Diabetes Daughter    History  Sexual Activity  . Sexual activity: Yes    Outpatient Encounter Prescriptions as of 04/22/2016  Medication Sig  . aspirin 325 MG tablet Take 325 mg by mouth daily.  Marland Kitchen atorvastatin (LIPITOR) 80 MG tablet Take 80 mg by mouth daily.  . carvedilol (COREG) 12.5 MG tablet Take 1 tablet (12.5 mg total) by mouth 2 (two) times daily with a meal.  . Cholecalciferol (VITAMIN D-3) 1000 units CAPS Take 1 capsule by mouth daily.  . clotrimazole-betamethasone (LOTRISONE) cream Apply 1 application topically as needed.  . gabapentin (NEURONTIN) 300 MG capsule TAKE ONE CAPSULE BY MOUTH THREE  TIMES DAILY  . Glimepiride (AMARYL PO) Take 3 mg by mouth 2 (two) times daily.   . hydrALAZINE (APRESOLINE) 50 MG tablet Take 1 tablet by mouth 2 (two) times daily.  Marland Kitchen HYDROcodone-acetaminophen (NORCO/VICODIN) 5-325 MG tablet Take 1 tablet by mouth every 6 (six) hours as needed for severe pain. For pain  . insulin degludec (TRESIBA) 100 UNIT/ML SOPN FlexTouch Pen Inject 10 Units into the skin daily after breakfast.   . isosorbide mononitrate (IMDUR) 30 MG 24 hr tablet Take 1 tablet (30 mg total) by mouth daily.  Marland Kitchen letrozole (FEMARA) 2.5 MG tablet Take 1 tablet by mouth daily.  Marland Kitchen losartan-hydrochlorothiazide (HYZAAR) 50-12.5 MG per tablet Take one-half tab once daily in the mornings.  . metFORMIN (GLUCOPHAGE-XR) 500 MG 24 hr tablet Take 1,000 mg by mouth 2 (two) times daily.  Marland Kitchen MILK THISTLE PO Take 1 capsule by mouth 2 (two) times daily.   . mirabegron ER ([name]) 50 MG TB24 Take by mouth daily.  . nitroGLYCERIN (NITROSTAT) 0.4 MG SL tablet Place 0.4 mg under the tongue every 5 (five) minutes as needed for chest pain.  . pantoprazole (PROTONIX) 40 MG tablet TAKE ONE TABLET BY MOUTH ONCE DAILY  . vitamin B-12 (CYANOCOBALAMIN) 1000 MCG tablet Take 2,500 mcg by mouth daily.    No facility-administered encounter medications on file as of 04/22/2016.     Activities of Daily Living In your present state of health, do you have any difficulty performing the following activities: 04/22/2016  Hearing? Y  Vision? N  Difficulty concentrating or making decisions? N  Walking or climbing stairs? Y  Dressing or bathing? N  Doing errands, shopping? N  Preparing Food and eating ? N  Using the Toilet? N  In the past six months, have you accidently leaked urine? Y  Do you have problems with loss of bowel control? Y  Managing your Medications? N  Managing your Finances? N  Housekeeping or managing your Housekeeping? N  Some recent data might be hidden    Patient Care Team: Abner Greenspan, MD as PCP  - General (Family Medicine) Carolan Clines, MD as Attending Physician (Urology) Minna Merritts, MD as Consulting Physician (Cardiology) Ilean China Benna Dunks, MD as Referring Physician (Cardiology) Oneta Rack, MD as Consulting Physician (Dermatology) Joyice Faster, MD as Referring Physician (Internal Medicine) Wynona Canes, MD as Referring Physician (Neurology) Bonnye Fava, MD as Referring Physician (Oncology) Judi Cong, MD as Physician Assistant (Internal Medicine) [name], MD as Consulting Physician (Family Medicine) Delfino Lovett I. Norman Herrlich, DDS as Consulting Physician (Dentistry) Gwyndolyn Saxon  Porfilio, MD as Referring Physician (Ophthalmology)    Assessment:     Hearing Screening   125Hz  250Hz  500Hz  1000Hz  2000Hz  3000Hz  4000Hz  6000Hz  8000Hz   Right ear:   40 40 40  0    Left ear:   40 40 40  40    Vision Screening Comments: Pt has future appt scheduled for Dec 2017 @ [facility] with Dr. Merla Riches   Exercise Activities and Dietary recommendations Current Exercise Habits: The patient does not participate in regular exercise at present, Exercise limited by: None identified  Goals    . Increase water intake          Starting 04/22/2016, I will attempt to drink at least 8 oz of water with each meal daily.       Fall Risk Fall Risk  04/22/2016 01/25/2016 01/02/2016 06/13/2014 05/19/2013  Falls in the past year? No (No Data) No No Yes  Number falls in past yr: - - - - 1  Risk for fall due to : - - (No Data) - Impaired mobility  Risk for fall due to (comments): - - uses cane &/or walker at times - -   Depression Screen PHQ 2/9 Scores 04/22/2016 01/02/2016 06/13/2014  PHQ - 2 Score 0 0 0     Cognitive Function MMSE - Mini Mental State Exam 04/22/2016  Orientation to time 5  Orientation to Place 5  Registration 3  Attention/ Calculation 0  Recall 3  Language- name 2 objects 0  Language- repeat 1  Language- follow 3 step command 3  Language- read &  follow direction 0  Write a sentence 0  Copy design 0  Total score 20     PLEASE NOTE: A Mini-Cog screen was completed. Maximum score is 20. A value of 0 denotes this part of [place] MMSE was not completed or the patient failed this part of the Mini-Cog screening.   Mini-Cog Screening Orientation to Time - Max 5 pts Orientation to Place - Max 5 pts Registration - Max 3 pts Recall - Max 3 pts Language Repeat - Max 1 pts Language Follow 3 Step Command - Max 3 pts     Immunization History  Administered Date(s) Administered  . Influenza,inj,Quad PF,36+ Mos 05/24/2015, 04/03/2016  . Influenza-Unspecified 03/29/2013, 04/11/2014, 04/03/2016  . Pneumococcal Conjugate-13 06/13/2014  . Pneumococcal Polysaccharide-23 01/14/2011   Screening Tests Health Maintenance  Topic Date Due  . FOOT EXAM  06/12/2016 (Originally 06/14/2015)  . OPHTHALMOLOGY EXAM  04/22/2017 (Originally 12/02/2014)  . DEXA SCAN  04/22/2017 (Originally 12/08/2001)  . TETANUS/TDAP  04/22/2026 (Originally 12/09/1953)  . ZOSTAVAX  04/22/2029 (Originally 12/08/1996)  . MAMMOGRAM  08/16/2016  . HEMOGLOBIN A1C  10/20/2016  . INFLUENZA VACCINE  Completed  . PNA vac Low Risk Adult  Completed      Plan:     I have personally reviewed and addressed the Medicare Annual Wellness questionnaire and have noted the following in the patient's chart:  A. Medical and social history B. Use of alcohol, tobacco or illicit drugs  C. Current medications and supplements D. Functional ability and status E.  Nutritional status F.  Physical activity G. Advance directives H. List of other physicians I.  Hospitalizations, surgeries, and ER visits in previous 12 months J.  [place] to include hearing, vision, cognitive, depression L. Referrals and appointments - none  In addition, I have reviewed and discussed with patient certain preventive protocols, quality metrics, and best practice recommendations. A written personalized care  plan for preventive services as well as general preventive health recommendations were provided to patient.  See attached scanned questionnaire for additional information.   Signed,   Lindell Noe, MHA, BS, LPN Health Coach

## 2016-04-24 ENCOUNTER — Encounter: Payer: Self-pay | Admitting: Family Medicine

## 2016-04-24 ENCOUNTER — Ambulatory Visit (INDEPENDENT_AMBULATORY_CARE_PROVIDER_SITE_OTHER): Payer: Medicare HMO | Admitting: Family Medicine

## 2016-04-24 VITALS — BP 134/64 | HR 74 | Temp 97.7°F | Ht 60.25 in | Wt 153.2 lb

## 2016-04-24 DIAGNOSIS — N289 Disorder of kidney and ureter, unspecified: Secondary | ICD-10-CM

## 2016-04-24 DIAGNOSIS — I10 Essential (primary) hypertension: Secondary | ICD-10-CM

## 2016-04-24 DIAGNOSIS — Z0000 Encounter for general adult medical examination without abnormal findings: Secondary | ICD-10-CM | POA: Diagnosis not present

## 2016-04-24 DIAGNOSIS — E2839 Other primary ovarian failure: Secondary | ICD-10-CM | POA: Insufficient documentation

## 2016-04-24 DIAGNOSIS — I6350 Cerebral infarction due to unspecified occlusion or stenosis of unspecified cerebral artery: Secondary | ICD-10-CM

## 2016-04-24 DIAGNOSIS — N950 Postmenopausal bleeding: Secondary | ICD-10-CM | POA: Insufficient documentation

## 2016-04-24 DIAGNOSIS — E875 Hyperkalemia: Secondary | ICD-10-CM

## 2016-04-24 DIAGNOSIS — E7800 Pure hypercholesterolemia, unspecified: Secondary | ICD-10-CM

## 2016-04-24 MED ORDER — PANTOPRAZOLE SODIUM 40 MG PO TBEC: 40.0000 mg | DELAYED_RELEASE_TABLET | Freq: Every day | ORAL | 3 refills | Status: DC

## 2016-04-24 NOTE — Progress Notes (Signed)
Subjective:    Patient ID: Morgan Hubbard, female    DOB: 1934-08-24, 81 y.o.   MRN: [id number]  HPI Here for health maintenance exam and to review chronic medical problems    Has been feeling pretty well overall    Wt Readings from Last 3 Encounters:  04/24/16 153 lb 4 oz (69.5 kg)  04/22/16 152 lb (68.9 kg)  04/15/16 153 lb 4 oz (69.5 kg)  this is stable  bmi is 29.6  Had AMW with Katha Cabal 11/20 Eye exam is scheduled next month   Tetanus shot -unsure when it was   Declines zoster vaccine   dexa -interested No falls or fractures On vit D On femera  Does not exercise -plans to start   She has started a meditation program   Hearing test-missed 4000 Hz for R ear only Does not bother pt at all  She is prone to wax build up (she uses otc prep to help)   Mammogram 3/17 Personal hx of breast cancer est rec positive R  [facility] at [facility] On femara  Self breast exam  She has not had a hysterectomy  Has oncol appt upcoming next mo and she will have genetic testing    Pt states she has had some bleeding- unsure if vaginal  Also wondered if hemorrhoidal  No menses otherwise since 58    Colonoscopy 7/12 normal (hx of polyps prior)  She has hemorrhoids occ bleeding (she thinks) - from that    She sees Dr Gabriel Carina at [place] for her DM-f/u next mo  Lab Results  Component Value Date   HGBA1C 9.0 (H) 04/22/2016  has not been doing great with it lately  Has been eating too much  Will be starting a program for exercise  microalb is up as well    bp is stable today  No cp or palpitations or headaches or edema  No side effects to medicines  BP Readings from Last 3 Encounters:  04/24/16 134/64  04/22/16 126/64  04/15/16 (!) 140/58     Hx of cardiomyopathy Hx of cva in the past - small vessel stroke - had disc anticoag opt with enuro and cardiology in the past  She is currently on full dose asa  She will start some cardio/pulm rehab   Hx of fatty liver Lab Results   Component Value Date   ALT 22 04/22/2016   AST 19 04/22/2016   ALKPHOS 75 04/22/2016   BILITOT 0.5 04/22/2016    Hx of goiter  Lab Results  Component Value Date   TSH 0.82 04/22/2016    Sees Dr Gabriel Carina Iodine tx in there past   Hx of past renal insuff  Is on losartan hct  Hx of labile K   Chemistry      Component Value Date/Time   NA 141 04/22/2016 1455   K 5.3 (H) 04/22/2016 1455   CL 104 04/22/2016 1455   CO2 31 04/22/2016 1455   BUN 21 04/22/2016 1455   CREATININE 1.07 04/22/2016 1455      Component Value Date/Time   CALCIUM 9.2 04/22/2016 1455   ALKPHOS 75 04/22/2016 1455   AST 19 04/22/2016 1455   ALT 22 04/22/2016 1455   BILITOT 0.5 04/22/2016 1455     has eaten more bananas/fruit lately  Plans to cut them out   Hx of hyperlipidemia Lab Results  Component Value Date   CHOL 146 04/22/2016   CHOL 86 06/06/2014   CHOL 130 03/01/2014  Lab Results  Component Value Date   HDL 42.70 04/22/2016   HDL 24.00 (L) 06/06/2014   HDL 32.90 (L) 03/01/2014   Lab Results  Component Value Date   LDLCALC 39 06/06/2014   LDLCALC 57 03/01/2014   LDLCALC 150 (H) 01/14/2014   Lab Results  Component Value Date   TRIG 219.0 (H) 04/22/2016   TRIG 114.0 06/06/2014   TRIG 199.0 (H) 03/01/2014   Lab Results  Component Value Date   CHOLHDL 3 04/22/2016   CHOLHDL 4 06/06/2014   CHOLHDL 4 03/01/2014   Lab Results  Component Value Date   LDLDIRECT 77.0 04/22/2016   LDLDIRECT 151.3 03/02/2013   Atorvastatin 80 Trig up due to high sugar   Patient Active Problem List   Diagnosis Date Noted  . Routine general medical examination at a health care facility 04/24/2016  . Post-menopausal bleeding 04/24/2016  . Estrogen deficiency 04/24/2016  . Urticaria 11/03/2015  . Poorly controlled type 2 diabetes mellitus ([place]) 06/21/2015  . Abdominal pain, epigastric 11/15/2014  . Low blood magnesium 11/04/2014  . Encounter for Medicare annual wellness exam 06/13/2014  . Cough  05/31/2014  . Stroke, small vessel ([place]) 01/18/2014  . Family history of hemochromatosis 01/18/2014  . Cerebral thrombosis with cerebral infarction ([place]) 01/14/2014  . Expressive aphasia 01/13/2014  . Nonischemic cardiomyopathy ([address]) 04/05/2013  . Shortness of breath 09/03/2012  . Gallstones 05/26/2012  . Elevated transaminase level 05/18/2012  . Spinal stenosis of lumbar region 12/10/2011  . Mixed incontinence urge and stress 12/10/2011  . Renal insufficiency 11/04/2011  . Goiter 01/14/2011  . Fatty liver 08/28/2010  . DM 07/09/2010  . HYPERCHOLESTEROLEMIA 07/09/2010  . HYPERKALEMIA 07/09/2010  . REFLEX SYMPATHETIC DYSTROPHY 07/09/2010  . POLYNEUROPATHY 07/09/2010  . Essential hypertension 07/09/2010  . CARDIOMYOPATHY 07/09/2010  . OVERACTIVE BLADDER 07/09/2010   Past Medical History:  Diagnosis Date  . Arthritis    "hands" (01/13/2014)  . Basal cell carcinoma 01/2014   "bridge of nose"  . Cardiac LV ejection fraction >40%    "it was 43 last year" (01/13/2014)  . Chronic lower back pain   . Colon polyps   . Expressive aphasia    "3 times in the last week" (01/13/2014)  . GERD (gastroesophageal reflux disease)   . Goiter past remote   treated with RI  . Hyperlipidemia   . Hyperpotassemia   . Hypertension   . Hypertonicity of bladder   . Inflammatory and toxic neuropathy, unspecified   . LBBB (left bundle branch block)   . Migraine    "stopped many years ago" (01/13/2014)  . Mini stroke ([place]) 01/2014  . Other primary cardiomyopathies   . Overactive bladder   . Presence of combination internal cardiac defibrillator (ICD) and pacemaker   . Reflex sympathetic dystrophy, unspecified   . Shingles   . Shortness of breath    ambulation  . Stroke ([place])   . Thyroid disease   . Type II diabetes mellitus ([place])   . Ulcer ([place])   . Urine incontinence   . Vertigo    hx of   Past Surgical History:  Procedure Laterality Date  . BACK SURGERY    . BI-VENTRICULAR PACEMAKER  INSERTION (CRT-P)  10/2014   [facility]  . BREAST CYST EXCISION Left 1959  . CARDIAC CATHETERIZATION  "several"   [place]  . CATARACT EXTRACTION W/ INTRAOCULAR LENS IMPLANT Left ~ 2005   several eye injections  . COLONOSCOPY  7/12   normal (hx of polyps in past)   .  CT SCAN  3/12   outside hosp- lung nodule and gallstones  . [place]   "knot on index"  . KIDNEY DONATION Left 1989  . LUMBAR LAMINECTOMY/DECOMPRESSION MICRODISCECTOMY  1999  . LUMBAR LAMINECTOMY/DECOMPRESSION MICRODISCECTOMY  01/31/2012   Procedure: LUMBAR LAMINECTOMY/DECOMPRESSION MICRODISCECTOMY 2 LEVELS;  Surgeon: Eustace Moore, MD;  Location: [facility];  Service: Neurosurgery;  Laterality: N/A;  Thoracic twelve-lumbar one, lumbar one-two laminectomy   . POSTERIOR LAMINECTOMY / DECOMPRESSION CERVICAL SPINE  1999  . TUBAL LIGATION  1976  . UPPER GASTROINTESTINAL ENDOSCOPY     Social History  Substance Use Topics  . Smoking status: Never Smoker  . Smokeless tobacco: Never Used  . Alcohol use No   Family History  Problem Relation Age of Onset  . Arthritis Mother   . Cancer Mother     uterine and mouth  . Hyperlipidemia Mother   . Stroke Mother   . Hypertension Mother   . Alcohol abuse Father   . Diabetes Father   . Cancer Sister     breast  . Diabetes Sister   . Cancer Brother     lung cancer  . Kidney disease Brother   . Diabetes Brother   . Hyperlipidemia Sister   . Heart disease Sister   . Hypertension Sister   . Diabetes Sister   . Hyperlipidemia Brother   . Kidney failure Brother   . Diabetes Daughter    Allergies  Allergen Reactions  . Ace Inhibitors Swelling    REACTION: tongue swelling  . Codeine Other (See Comments)    REACTION: nausea  . Insulin Detemir Itching  . Lisinopril Swelling    Swelling of tongue  . Nsaids   . Tylenol [Acetaminophen]    Current Outpatient Prescriptions on File Prior to Visit  Medication Sig Dispense Refill  . aspirin 325 MG tablet  Take 325 mg by mouth daily.    Marland Kitchen atorvastatin (LIPITOR) 80 MG tablet Take 80 mg by mouth daily.    . carvedilol (COREG) 12.5 MG tablet Take 1 tablet (12.5 mg total) by mouth 2 (two) times daily with a meal. 180 tablet 4  . Cholecalciferol (VITAMIN D-3) 1000 units CAPS Take 1 capsule by mouth daily.    . clotrimazole-betamethasone (LOTRISONE) cream Apply 1 application topically as needed.    . gabapentin (NEURONTIN) 300 MG capsule TAKE ONE CAPSULE BY MOUTH THREE TIMES DAILY 270 capsule 1  . Glimepiride (AMARYL PO) Take 3 mg by mouth 2 (two) times daily.     . hydrALAZINE (APRESOLINE) 50 MG tablet Take 1 tablet by mouth 2 (two) times daily.    Marland Kitchen HYDROcodone-acetaminophen (NORCO/VICODIN) 5-325 MG tablet Take 1 tablet by mouth every 6 (six) hours as needed for severe pain. For pain 30 tablet 0  . insulin degludec (TRESIBA) 100 UNIT/ML SOPN FlexTouch Pen Inject 10 Units into the skin daily after breakfast.     . isosorbide mononitrate (IMDUR) 30 MG 24 hr tablet Take 1 tablet (30 mg total) by mouth daily. 90 tablet 4  . letrozole (FEMARA) 2.5 MG tablet Take 1 tablet by mouth daily.    Marland Kitchen losartan-hydrochlorothiazide (HYZAAR) 50-12.5 MG per tablet Take one-half tab once daily in the mornings.    . metFORMIN (GLUCOPHAGE-XR) 500 MG 24 hr tablet Take 1,000 mg by mouth 2 (two) times daily.    Marland Kitchen MILK THISTLE PO Take 1 capsule by mouth 2 (two) times daily.     . mirabegron ER ([name]) 50  MG TB24 Take by mouth daily.    . nitroGLYCERIN (NITROSTAT) 0.4 MG SL tablet Place 0.4 mg under the tongue every 5 (five) minutes as needed for chest pain.    . vitamin B-12 (CYANOCOBALAMIN) 1000 MCG tablet Take 2,500 mcg by mouth daily.      No current facility-administered medications on file prior to visit.     Review of Systems    Review of Systems  Constitutional: Negative for fever, appetite change, fatigue and unexpected weight change.  Eyes: Negative for pain and visual disturbance.  Respiratory: Negative for  cough and shortness of breath.   Cardiovascular: Negative for cp or palpitations    Gastrointestinal: Negative for nausea, diarrhea and constipation.  Genitourinary: Negative for urgency and frequency.  Skin: Negative for pallor or rash   Neurological: Negative for weakness, light-headedness, numbness and headaches.  Hematological: Negative for adenopathy. Does not bruise/bleed easily.  Psychiatric/Behavioral: Negative for dysphoric mood. The patient is not nervous/anxious.      Objective:   Physical Exam  Constitutional: She appears well-developed and well-nourished. No distress.  HENT:  Head: Normocephalic and atraumatic.  Right Ear: External ear normal.  Left Ear: External ear normal.  Mouth/Throat: Oropharynx is clear and moist.  Eyes: Conjunctivae and EOM are normal. Pupils are equal, round, and reactive to light. No scleral icterus.  Neck: Normal range of motion. Neck supple. No JVD present. Carotid bruit is not present. No thyromegaly present.  Cardiovascular: Normal rate, regular rhythm, normal heart sounds and intact distal pulses.  Exam reveals no gallop.   Varicose veins noted   Pulmonary/Chest: Effort normal and breath sounds normal. No respiratory distress. She has no wheezes. She exhibits no tenderness.  Abdominal: Soft. Bowel sounds are normal. She exhibits no distension, no abdominal bruit and no mass. There is no tenderness.  Genitourinary: No breast swelling, tenderness, discharge or bleeding.  Genitourinary Comments: Breast exam: No mass, nodules, thickening, tenderness, bulging, retraction, inflamation, nipple discharge or skin changes noted.  No axillary or clavicular LA.    (surgical changes noted on the R)  Musculoskeletal: Normal range of motion. She exhibits no edema or tenderness.  Lymphadenopathy:    She has no cervical adenopathy.  Neurological: She is alert. She has normal reflexes. No cranial nerve deficit. She exhibits normal muscle tone. Coordination  normal.  Skin: Skin is warm and dry. No rash noted. No erythema. No pallor.  Psychiatric: She has a normal mood and affect.          Assessment & Plan:   Problem List Items Addressed This Visit      Cardiovascular and Mediastinum   Stroke, small vessel ([place])    Pt remains on asa full dose with lipid and bp control  No further episodes Currently tolerating the asa       Essential hypertension    bp in fair control at this time  BP Readings from Last 1 Encounters:  04/24/16 134/64   No changes needed Disc lifstyle change with low sodium diet and exercise   Labs reviewed         Genitourinary   Renal insufficiency    Labs are fairly stable today  She does have elevated microalb-to disc w/endocriniologist  Likely some early DM nephrology K is up again-will cut out bananas and re check        Other   Routine general medical examination at a health care facility - Primary    Reviewed health habits including diet and exercise and  skin cancer prevention Reviewed appropriate screening tests for age  Also reviewed health mt list, fam hx and immunization status , as well as social and family history    Rev AMW See HPI Labs rev  Will f/u with endo for her DM Enc exercise as able  Get eye exam as planned  Ref for dexa Ref for gyn eval of post men bleeding  Signed up for cologuard for colon screening  Given handout regarding getting a tetanus shot       Post-menopausal bleeding    Pt suspects she has had some vaginal bleeding  Ref to gyn for eval and poss endo bx She is on femara for breast cancer currently      Relevant Orders   Ambulatory referral to Gynecology   HYPERKALEMIA    This is recurrent and usually correlates with high K foods She is back to eating bananas daily - inst her to stop them entirely  Rev K in diet Re check 2 wk       Relevant Orders   Basic metabolic panel   HYPERCHOLESTEROLEMIA    Disc goals for lipids and reasons to control  them Rev labs with pt Rev low sat fat diet in detail  Trig up due to worse glucose control Will continue high dose atorvastatin       Estrogen deficiency   Relevant Orders   DG Bone Density

## 2016-04-24 NOTE — Assessment & Plan Note (Signed)
Reviewed health habits including diet and exercise and skin cancer prevention Reviewed appropriate screening tests for age  Also reviewed health mt list, fam hx and immunization status , as well as social and family history    Rev AMW See HPI Labs rev  Will f/u with endo for her DM Enc exercise as able  Get eye exam as planned  Ref for dexa Ref for gyn eval of post men bleeding  Signed up for cologuard for colon screening  Given handout regarding getting a tetanus shot

## 2016-04-24 NOTE — Assessment & Plan Note (Signed)
Pt remains on asa full dose with lipid and bp control  No further episodes Currently tolerating the asa

## 2016-04-24 NOTE — Progress Notes (Signed)
Pre visit review using our clinic review tool, if applicable. No additional management support is needed unless otherwise documented below in the visit note. 

## 2016-04-24 NOTE — Patient Instructions (Addendum)
Look at the handout on tetanus vaccine  We will sign you up for the cologuard program for colon screening  Stop at check out for referral for bone density and gyn  Cut out bananas due to high potassium  Schedule non fasting lab in 2 weeks to re check potassium  Work on healthy diet and execise-diabetes is worse See Dr Gabriel Carina as planned   Take care of yourself

## 2016-04-24 NOTE — Assessment & Plan Note (Signed)
This is recurrent and usually correlates with high K foods She is back to eating bananas daily - inst her to stop them entirely  Rev K in diet Re check 2 wk

## 2016-04-24 NOTE — Assessment & Plan Note (Signed)
Labs are fairly stable today  She does have elevated microalb-to disc w/endocriniologist  Likely some early DM nephrology K is up again-will cut out bananas and re check

## 2016-04-24 NOTE — Assessment & Plan Note (Signed)
Disc goals for lipids and reasons to control them Rev labs with pt Rev low sat fat diet in detail  Trig up due to worse glucose control Will continue high dose atorvastatin

## 2016-04-24 NOTE — Assessment & Plan Note (Signed)
Pt suspects she has had some vaginal bleeding  Ref to gyn for eval and poss endo bx She is on femara for breast cancer currently

## 2016-04-24 NOTE — Assessment & Plan Note (Signed)
bp in fair control at this time  BP Readings from Last 1 Encounters:  04/24/16 134/64   No changes needed Disc lifstyle change with low sodium diet and exercise   Labs reviewed

## 2016-04-30 ENCOUNTER — Telehealth: Payer: Self-pay | Admitting: *Deleted

## 2016-04-30 NOTE — Telephone Encounter (Signed)
Patient left a voicemail requesting that a copy of her recent labs be sent to Dr. Gabriel Carina. Patient also wanted to let you know that she has an appointment scheduled with her GYN Dr. Leonides Schanz on 05/23/16.

## 2016-04-30 NOTE — Telephone Encounter (Signed)
Thanks -please send copy of labs to Dr Gabriel Carina (unsure if she can see them with care everywhere or not)

## 2016-05-01 NOTE — Telephone Encounter (Signed)
Copy of labs sent to Dr. Gabriel Carina

## 2016-05-08 ENCOUNTER — Other Ambulatory Visit (INDEPENDENT_AMBULATORY_CARE_PROVIDER_SITE_OTHER): Payer: Medicare HMO

## 2016-05-08 DIAGNOSIS — E875 Hyperkalemia: Secondary | ICD-10-CM | POA: Diagnosis not present

## 2016-05-08 LAB — BASIC METABOLIC PANEL
BUN: 16 mg/dL (ref 6–23)
CHLORIDE: 104 meq/L (ref 96–112)
CO2: 33 mEq/L — ABNORMAL HIGH (ref 19–32)
Calcium: 9.5 mg/dL (ref 8.4–10.5)
Creatinine, Ser: 0.98 mg/dL (ref 0.40–1.20)
GFR: 58.12 mL/min — ABNORMAL LOW (ref >60.00)
Glucose, Bld: 119 mg/dL — ABNORMAL HIGH (ref 70–99)
POTASSIUM: 5.2 meq/L — AB (ref 3.5–5.1)
Sodium: 145 mEq/L (ref 135–145)

## 2016-05-13 DIAGNOSIS — R809 Proteinuria, unspecified: Secondary | ICD-10-CM | POA: Diagnosis not present

## 2016-05-13 DIAGNOSIS — I10 Essential (primary) hypertension: Secondary | ICD-10-CM | POA: Diagnosis not present

## 2016-05-13 DIAGNOSIS — E1165 Type 2 diabetes mellitus with hyperglycemia: Secondary | ICD-10-CM | POA: Diagnosis not present

## 2016-05-13 DIAGNOSIS — Z794 Long term (current) use of insulin: Secondary | ICD-10-CM | POA: Diagnosis not present

## 2016-05-13 DIAGNOSIS — E1129 Type 2 diabetes mellitus with other diabetic kidney complication: Secondary | ICD-10-CM | POA: Diagnosis not present

## 2016-05-13 DIAGNOSIS — H34832 Tributary (branch) retinal vein occlusion, left eye, with macular edema: Secondary | ICD-10-CM | POA: Diagnosis not present

## 2016-05-13 LAB — HM DIABETES EYE EXAM

## 2016-05-15 DIAGNOSIS — Z853 Personal history of malignant neoplasm of breast: Secondary | ICD-10-CM | POA: Diagnosis not present

## 2016-05-15 DIAGNOSIS — N6312 Unspecified lump in the right breast, upper inner quadrant: Secondary | ICD-10-CM | POA: Diagnosis not present

## 2016-05-15 DIAGNOSIS — Z8042 Family history of malignant neoplasm of prostate: Secondary | ICD-10-CM | POA: Diagnosis not present

## 2016-05-15 DIAGNOSIS — N6321 Unspecified lump in the left breast, upper outer quadrant: Secondary | ICD-10-CM | POA: Diagnosis not present

## 2016-05-15 DIAGNOSIS — N63 Unspecified lump in unspecified breast: Secondary | ICD-10-CM | POA: Diagnosis not present

## 2016-05-15 DIAGNOSIS — Z800 Family history of malignant neoplasm of digestive organs: Secondary | ICD-10-CM | POA: Diagnosis not present

## 2016-05-15 DIAGNOSIS — Z170 Estrogen receptor positive status [ER+]: Secondary | ICD-10-CM | POA: Diagnosis not present

## 2016-05-15 DIAGNOSIS — C50311 Malignant neoplasm of lower-inner quadrant of right female breast: Secondary | ICD-10-CM | POA: Diagnosis not present

## 2016-05-17 DIAGNOSIS — I259 Chronic ischemic heart disease, unspecified: Secondary | ICD-10-CM | POA: Diagnosis not present

## 2016-05-17 DIAGNOSIS — Z6829 Body mass index (BMI) 29.0-29.9, adult: Secondary | ICD-10-CM | POA: Diagnosis not present

## 2016-05-17 DIAGNOSIS — C50419 Malignant neoplasm of upper-outer quadrant of unspecified female breast: Secondary | ICD-10-CM | POA: Diagnosis not present

## 2016-05-17 DIAGNOSIS — I10 Essential (primary) hypertension: Secondary | ICD-10-CM | POA: Diagnosis not present

## 2016-05-17 DIAGNOSIS — Z0000 Encounter for general adult medical examination without abnormal findings: Secondary | ICD-10-CM | POA: Diagnosis not present

## 2016-05-17 DIAGNOSIS — E785 Hyperlipidemia, unspecified: Secondary | ICD-10-CM | POA: Diagnosis not present

## 2016-05-17 DIAGNOSIS — I209 Angina pectoris, unspecified: Secondary | ICD-10-CM | POA: Diagnosis not present

## 2016-05-17 DIAGNOSIS — Z794 Long term (current) use of insulin: Secondary | ICD-10-CM | POA: Diagnosis not present

## 2016-05-17 DIAGNOSIS — E119 Type 2 diabetes mellitus without complications: Secondary | ICD-10-CM | POA: Diagnosis not present

## 2016-05-17 DIAGNOSIS — K219 Gastro-esophageal reflux disease without esophagitis: Secondary | ICD-10-CM | POA: Diagnosis not present

## 2016-05-20 ENCOUNTER — Encounter: Payer: Self-pay | Admitting: Family Medicine

## 2016-05-20 DIAGNOSIS — E119 Type 2 diabetes mellitus without complications: Secondary | ICD-10-CM

## 2016-05-21 MED ORDER — HYDROCODONE-ACETAMINOPHEN 5-325 MG PO TABS: 1.0000 | ORAL_TABLET | Freq: Four times a day (QID) | ORAL | 0 refills | Status: DC | PRN

## 2016-05-23 DIAGNOSIS — K644 Residual hemorrhoidal skin tags: Secondary | ICD-10-CM | POA: Diagnosis not present

## 2016-05-23 DIAGNOSIS — N950 Postmenopausal bleeding: Secondary | ICD-10-CM | POA: Diagnosis not present

## 2016-06-10 ENCOUNTER — Ambulatory Visit
Admission: RE | Admit: 2016-06-10 | Discharge: 2016-06-10 | Disposition: A | Discharge status: Home / Self Care | Payer: Medicare HMO | Source: Ambulatory Visit | Attending: Family Medicine | Admitting: Family Medicine

## 2016-06-10 DIAGNOSIS — Z780 Asymptomatic menopausal state: Secondary | ICD-10-CM | POA: Diagnosis not present

## 2016-06-10 DIAGNOSIS — M8589 Other specified disorders of bone density and structure, multiple sites: Secondary | ICD-10-CM | POA: Diagnosis not present

## 2016-06-10 DIAGNOSIS — E2839 Other primary ovarian failure: Secondary | ICD-10-CM | POA: Insufficient documentation

## 2016-06-10 DIAGNOSIS — M85832 Other specified disorders of bone density and structure, left forearm: Secondary | ICD-10-CM | POA: Diagnosis not present

## 2016-06-10 DIAGNOSIS — Z13820 Encounter for screening for osteoporosis: Secondary | ICD-10-CM | POA: Diagnosis not present

## 2016-06-10 DIAGNOSIS — Z853 Personal history of malignant neoplasm of breast: Secondary | ICD-10-CM | POA: Diagnosis not present

## 2016-06-10 LAB — HM DEXA SCAN: HM Dexa Scan: BORDERLINE

## 2016-06-16 ENCOUNTER — Encounter: Payer: Self-pay | Admitting: Family Medicine

## 2016-06-16 DIAGNOSIS — M8580 Other specified disorders of bone density and structure, unspecified site: Secondary | ICD-10-CM | POA: Insufficient documentation

## 2016-06-16 DIAGNOSIS — M810 Age-related osteoporosis without current pathological fracture: Secondary | ICD-10-CM | POA: Insufficient documentation

## 2016-06-21 DIAGNOSIS — A492 Hemophilus influenzae infection, unspecified site: Secondary | ICD-10-CM | POA: Diagnosis not present

## 2016-06-21 DIAGNOSIS — Z170 Estrogen receptor positive status [ER+]: Secondary | ICD-10-CM | POA: Diagnosis not present

## 2016-06-21 DIAGNOSIS — I517 Cardiomegaly: Secondary | ICD-10-CM | POA: Diagnosis not present

## 2016-06-21 DIAGNOSIS — J069 Acute upper respiratory infection, unspecified: Secondary | ICD-10-CM | POA: Diagnosis not present

## 2016-06-21 DIAGNOSIS — C50311 Malignant neoplasm of lower-inner quadrant of right female breast: Secondary | ICD-10-CM | POA: Diagnosis not present

## 2016-06-24 ENCOUNTER — Encounter: Payer: Self-pay | Admitting: *Deleted

## 2016-07-02 DIAGNOSIS — I471 Supraventricular tachycardia: Secondary | ICD-10-CM | POA: Diagnosis not present

## 2016-07-02 DIAGNOSIS — Z4502 Encounter for adjustment and management of automatic implantable cardiac defibrillator: Secondary | ICD-10-CM | POA: Diagnosis not present

## 2016-07-15 DIAGNOSIS — R69 Illness, unspecified: Secondary | ICD-10-CM | POA: Diagnosis not present

## 2016-08-12 DIAGNOSIS — R809 Proteinuria, unspecified: Secondary | ICD-10-CM | POA: Diagnosis not present

## 2016-08-12 DIAGNOSIS — E1165 Type 2 diabetes mellitus with hyperglycemia: Secondary | ICD-10-CM | POA: Diagnosis not present

## 2016-08-12 DIAGNOSIS — E1129 Type 2 diabetes mellitus with other diabetic kidney complication: Secondary | ICD-10-CM | POA: Diagnosis not present

## 2016-08-21 DIAGNOSIS — E1129 Type 2 diabetes mellitus with other diabetic kidney complication: Secondary | ICD-10-CM | POA: Diagnosis not present

## 2016-08-21 DIAGNOSIS — E1165 Type 2 diabetes mellitus with hyperglycemia: Secondary | ICD-10-CM | POA: Diagnosis not present

## 2016-08-21 DIAGNOSIS — R809 Proteinuria, unspecified: Secondary | ICD-10-CM | POA: Diagnosis not present

## 2016-08-21 DIAGNOSIS — Z794 Long term (current) use of insulin: Secondary | ICD-10-CM | POA: Diagnosis not present

## 2016-08-21 DIAGNOSIS — I10 Essential (primary) hypertension: Secondary | ICD-10-CM | POA: Diagnosis not present

## 2016-08-31 NOTE — Progress Notes (Signed)
Cardiology Office Note  Date:  09/03/2016   ID:  ETHA Hubbard, DOB 09-Dec-1934, MRN [id number]  PCP:  [name], MD   Chief Complaint  Patient presents with  . other    Early 6 month f/u c/o sob and pt not feeling like herself. Meds reviewed verbally with pt.    HPI:  [name] is a very pleasant 82 year old woman with past medical history of nonischemic cardiomyopathy, Biventricular ICD placed May 2016  Cath 2012 with no CAD ejection fraction of 67% in 6720 of uncertain etiology ,  left bundle branch block,  TIA/stroke August 2015. had difficulty speaking, Walking nephrectomy/donated kidney in 1989,  possible angioedema on lisinopril,  back surgery x3,  \minimal carotid b/l in 2015 poorly controlled diabetes Hemoglobin A1c 9.4 to 7.9,  previously seen for shortness of breath.  She presents for routine followup Of her cardiomyopathy Ejection fraction of 15-20% in 2015, Up to 35% in August 2016, Up to 40% in 2017  chronic SOB with exertion In follow-up today Not taking lasix She has been waking up in the middle of the night with shortness of breath, has to sleep in her recliner Wakes up, calms down, deep breaths Flu in 06/2016 No regular exercise program , travels, goes to the beach , owns a house there Deconditioned at baseline Severe gait instability Denies any leg swelling, abdominal bloating, cough, PND or orthopnea  Does not take Lasix, inconvenience of going to the bathroom Weight continues to trend higher, was previously up 5 pounds on last clinic visit, now up another 5 pounds on today's visit  EKG Personally reviewed by myself on today's visit shows paced rhythm rate 70 bpm  TIA/stroke August 2015. had difficulty speaking, Walking Initially placed on aspirin, Plavix, been changed by neurology to aspirin 325 mg daily , Plavix held She continues to follow-up at [facility]  with cardiology and EP Biventricular ICD placed May 2016 for shortness of  breath symptoms placed by Dr. Beather Arbour  echocardiogram from August 2016 reviewed with her from [facility] showing ejection fraction 35% , no significant valve disease   recent echocardiogram performed at [facility] from 2017 showing ejection fraction 40%  Previous hemoglobin A1c 8.1,  managed by endocrinology Previously had low magnesium 1.3, was taking supplement, no longer taking supplement as this ran out. Total cholesterol general 2016 was 86 , takes Lipitor 80 mg daily  other past medical history History ofsevere back disease she has significant neuropathy in her legs.   Echocardiogram was done for her fatigue and shortness of breath. ejection fraction of 25-30% which was significant drop from prior ejection fraction February 2013 (45%) done at [facility]. normal RVSP.  Prior cardiac catheterization 08/30/2010 in [place] showed no significant coronary artery disease  PMH:   has a past medical history of Arthritis; Basal cell carcinoma (01/2014); Cardiac LV ejection fraction >40%; Chronic lower back pain; Colon polyps; Expressive aphasia; GERD (gastroesophageal reflux disease); Goiter (past remote); Hyperlipidemia; Hyperpotassemia; Hypertension; Hypertonicity of bladder; Inflammatory and toxic neuropathy, unspecified; LBBB (left bundle branch block); Migraine; Mini stroke ([place]) (01/2014); Other primary cardiomyopathies; Overactive bladder; Presence of combination internal cardiac defibrillator (ICD) and pacemaker; Reflex sympathetic dystrophy, unspecified; Shingles; Shortness of breath; Stroke ([place]); Thyroid disease; Type II diabetes mellitus ([place]); Ulcer ([place]); Urine incontinence; and Vertigo.  PSH:    Past Surgical History:  Procedure Laterality Date  . BACK SURGERY    . BI-VENTRICULAR PACEMAKER INSERTION (CRT-P)  10/2014   [facility]  . BREAST CYST EXCISION  Left 1959  . CARDIAC CATHETERIZATION  "several"   [place]  . CATARACT EXTRACTION W/ INTRAOCULAR LENS IMPLANT Left ~  2005   several eye injections  . COLONOSCOPY  7/12   normal (hx of polyps in past)   . CT SCAN  3/12   outside hosp- lung nodule and gallstones  . [place]   "knot on index"  . KIDNEY DONATION Left 1989  . LUMBAR LAMINECTOMY/DECOMPRESSION MICRODISCECTOMY  1999  . LUMBAR LAMINECTOMY/DECOMPRESSION MICRODISCECTOMY  01/31/2012   Procedure: LUMBAR LAMINECTOMY/DECOMPRESSION MICRODISCECTOMY 2 LEVELS;  Surgeon: Eustace Moore, MD;  Location: [place] [facility];  Service: Neurosurgery;  Laterality: N/A;  Thoracic twelve-lumbar one, lumbar one-two laminectomy   . POSTERIOR LAMINECTOMY / DECOMPRESSION CERVICAL SPINE  1999  . TUBAL LIGATION  1976  . UPPER GASTROINTESTINAL ENDOSCOPY      Current Outpatient Prescriptions  Medication Sig Dispense Refill  . aspirin 325 MG tablet Take 325 mg by mouth daily.    Marland Kitchen atorvastatin (LIPITOR) 80 MG tablet Take 80 mg by mouth daily.    . carvedilol (COREG) 12.5 MG tablet Take 1 tablet (12.5 mg total) by mouth 2 (two) times daily with a meal. 180 tablet 4  . Cholecalciferol (VITAMIN D-3) 1000 units CAPS Take 1 capsule by mouth daily.    . clotrimazole-betamethasone (LOTRISONE) cream Apply 1 application topically as needed.    . gabapentin (NEURONTIN) 300 MG capsule TAKE ONE CAPSULE BY MOUTH THREE TIMES DAILY 270 capsule 1  . Glimepiride (AMARYL PO) Take 3 mg by mouth 2 (two) times daily.     . hydrALAZINE (APRESOLINE) 50 MG tablet Take 1 tablet by mouth 2 (two) times daily.    Marland Kitchen HYDROcodone-acetaminophen (NORCO/VICODIN) 5-325 MG tablet Take 1 tablet by mouth every 6 (six) hours as needed for severe pain. For pain 30 tablet 0  . insulin degludec (TRESIBA) 100 UNIT/ML SOPN FlexTouch Pen Inject 20 Units into the skin daily after breakfast.     . isosorbide mononitrate (IMDUR) 30 MG 24 hr tablet Take 1 tablet (30 mg total) by mouth daily. 90 tablet 4  . letrozole (FEMARA) 2.5 MG tablet Take 1 tablet by mouth daily.    Marland Kitchen losartan-hydrochlorothiazide  (HYZAAR) 50-12.5 MG per tablet Take one-half tab once daily in the mornings.    . metFORMIN (GLUCOPHAGE-XR) 500 MG 24 hr tablet Take 1,000 mg by mouth 2 (two) times daily.    Marland Kitchen MILK THISTLE PO Take 1 capsule by mouth 2 (two) times daily.     . mirabegron ER ([name]) 50 MG TB24 Take by mouth daily.    . nitroGLYCERIN (NITROSTAT) 0.4 MG SL tablet Place 0.4 mg under the tongue every 5 (five) minutes as needed for chest pain.    . pantoprazole (PROTONIX) 40 MG tablet Take 1 tablet (40 mg total) by mouth daily. 90 tablet 3  . vitamin B-12 (CYANOCOBALAMIN) 1000 MCG tablet Take 2,500 mcg by mouth daily.      No current facility-administered medications for this visit.      Allergies:   Ace inhibitors; Codeine; Insulin detemir; Lisinopril; Nsaids; and Tylenol [acetaminophen]   Social History:  The patient  reports that she has never smoked. She has never used smokeless tobacco. She reports that she does not drink alcohol or use drugs.   Family History:   family history includes Alcohol abuse in her father; Arthritis in her mother; Cancer in her brother, mother, and sister; Diabetes in her brother, daughter, father, sister, and sister;  Heart disease in her sister; Hyperlipidemia in her brother, mother, and sister; Hypertension in her mother and sister; Kidney disease in her brother; Kidney failure in her brother; Stroke in her mother.    Review of Systems: Review of Systems  Constitutional: Negative.   Respiratory: Positive for shortness of breath.   Cardiovascular: Negative.   Gastrointestinal: Negative.   Musculoskeletal: Negative.        Gait instability  Neurological: Negative.   Psychiatric/Behavioral: Negative.   All other systems reviewed and are negative.    PHYSICAL EXAM: VS:  Ht 5' (1.524 m)   Wt 158 lb 8 oz (71.9 kg)   LMP 06/03/1992   BMI 30.95 kg/m  , BMI Body mass index is 30.95 kg/m. GEN: Well nourished, well developed, in no acute distress , Obese HEENT: normal   Neck: no JVD, carotid bruits, or masses Cardiac: RRR; no murmurs, rubs, or gallops,no edema  Respiratory:  clear to auscultation bilaterally, normal work of breathing GI: soft, nontender, nondistended, + BS MS: no deformity or atrophy  Skin: warm and dry, no rash Neuro:  Strength and sensation are intact Psych: euthymic mood, full affect    Recent Labs: 04/22/2016: ALT 22; Hemoglobin 13.2; Magnesium 1.5; Platelets 181.0; TSH 0.82 05/08/2016: BUN 16; Creatinine, Ser 0.98; Potassium 5.2; Sodium 145    Lipid Panel Lab Results  Component Value Date   CHOL 146 04/22/2016   HDL 42.70 04/22/2016   LDLCALC 39 06/06/2014   TRIG 219.0 (H) 04/22/2016      Wt Readings from Last 3 Encounters:  09/03/16 158 lb 8 oz (71.9 kg)  04/24/16 153 lb 4 oz (69.5 kg)  04/22/16 152 lb (68.9 kg)       ASSESSMENT AND PLAN:  HYPERCHOLESTEROLEMIA Cholesterol is at goal on the current lipid regimen. No changes to the medications were made.  Essential hypertension As below, may need to increase the losartan HCTZ up to a full pill  Nonischemic cardiomyopathy ([place]) Prior ejection fraction 40% Blood pressure elevated on today's visit but did not take her morning medication Recommended she call us with some blood pressure numbers, may need to increase the losartan HCTZ up to a full pill on a regular basis  Cerebral thrombosis with cerebral infarction ([place])  Type 2 diabetes mellitus without complication, unspecified long term insulin use status (HCC) Slow improvement in her numbers Followed by endocrinology Still eats at restaurants on a frequent basis  Shortness of breath concerned about acute on chronic systolic CHF causing her shortness of breath symptoms Now with PND/orthopnea, additional 5 pound weight gain on top of previous 5 pound weight gain Recommended she take Lasix 3 days in a row then as needed for shortness of breath symptoms She does eat out a lot, warned him against high sodium  salt intake, fluid intake  Disposition:   F/U  6 months  No orders of the defined types were placed in this encounter.   Total encounter time more than 25 minutes  Greater than 50% was spent in counseling and coordination of care with the patient   Signed, Esmond Plants, M.D., Ph.D. 09/03/2016  [place], [place]

## 2016-09-03 ENCOUNTER — Ambulatory Visit (INDEPENDENT_AMBULATORY_CARE_PROVIDER_SITE_OTHER): Payer: Medicare HMO | Admitting: Cardiovascular Disease

## 2016-09-03 ENCOUNTER — Encounter: Payer: Self-pay | Admitting: Cardiovascular Disease

## 2016-09-03 VITALS — BP 158/68 | HR 70 | Ht 60.0 in | Wt 158.5 lb

## 2016-09-03 DIAGNOSIS — R0602 Shortness of breath: Secondary | ICD-10-CM | POA: Diagnosis not present

## 2016-09-03 DIAGNOSIS — E7800 Pure hypercholesterolemia, unspecified: Secondary | ICD-10-CM

## 2016-09-03 DIAGNOSIS — I10 Essential (primary) hypertension: Secondary | ICD-10-CM | POA: Diagnosis not present

## 2016-09-03 DIAGNOSIS — E119 Type 2 diabetes mellitus without complications: Secondary | ICD-10-CM

## 2016-09-03 DIAGNOSIS — I428 Other cardiomyopathies: Secondary | ICD-10-CM | POA: Diagnosis not present

## 2016-09-03 DIAGNOSIS — I6330 Cerebral infarction due to thrombosis of unspecified cerebral artery: Secondary | ICD-10-CM | POA: Diagnosis not present

## 2016-09-03 MED ORDER — FUROSEMIDE 20 MG PO TABS: 20.0000 mg | ORAL_TABLET | Freq: Every day | ORAL | 3 refills | Status: DC | PRN

## 2016-09-03 NOTE — Patient Instructions (Addendum)
Medication Instructions:   Try lasix three days in a row then as needed for shortness of breath  If needed for high blood pressure, Take the other 1/2 of the losartan HCTZ  Labwork:  No new labs needed  Testing/Procedures:  No further testing at this time   I recommend watching educational videos on topics of interest to you at:       [email or web address]  Enter code: [email or web address]    Follow-Up: It was a pleasure seeing you in the office today. Please call us if you have new issues that need to be addressed before your next appt.  [phone]  Your physician wants you to follow-up in: 6 months.  You will receive a reminder letter in the mail two months in advance. If you don't receive a letter, please call our office to schedule the follow-up appointment.  If you need a refill on your cardiac medications before your next appointment, please call your pharmacy.

## 2016-10-01 DIAGNOSIS — Z95810 Presence of automatic (implantable) cardiac defibrillator: Secondary | ICD-10-CM | POA: Diagnosis not present

## 2016-10-01 DIAGNOSIS — Z4502 Encounter for adjustment and management of automatic implantable cardiac defibrillator: Secondary | ICD-10-CM | POA: Diagnosis not present

## 2016-10-02 DIAGNOSIS — Z170 Estrogen receptor positive status [ER+]: Secondary | ICD-10-CM | POA: Diagnosis not present

## 2016-10-02 DIAGNOSIS — R69 Illness, unspecified: Secondary | ICD-10-CM | POA: Diagnosis not present

## 2016-10-02 DIAGNOSIS — C50311 Malignant neoplasm of lower-inner quadrant of right female breast: Secondary | ICD-10-CM | POA: Diagnosis not present

## 2016-10-02 DIAGNOSIS — N63 Unspecified lump in unspecified breast: Secondary | ICD-10-CM | POA: Diagnosis not present

## 2016-10-15 DIAGNOSIS — Z853 Personal history of malignant neoplasm of breast: Secondary | ICD-10-CM | POA: Diagnosis not present

## 2016-10-15 DIAGNOSIS — N63 Unspecified lump in unspecified breast: Secondary | ICD-10-CM | POA: Diagnosis not present

## 2016-11-21 DIAGNOSIS — E1165 Type 2 diabetes mellitus with hyperglycemia: Secondary | ICD-10-CM | POA: Diagnosis not present

## 2016-11-21 DIAGNOSIS — E1129 Type 2 diabetes mellitus with other diabetic kidney complication: Secondary | ICD-10-CM | POA: Diagnosis not present

## 2016-11-21 DIAGNOSIS — R809 Proteinuria, unspecified: Secondary | ICD-10-CM | POA: Diagnosis not present

## 2016-11-21 DIAGNOSIS — Z794 Long term (current) use of insulin: Secondary | ICD-10-CM | POA: Diagnosis not present

## 2016-11-26 DIAGNOSIS — Z794 Long term (current) use of insulin: Secondary | ICD-10-CM | POA: Diagnosis not present

## 2016-11-26 DIAGNOSIS — E1165 Type 2 diabetes mellitus with hyperglycemia: Secondary | ICD-10-CM | POA: Diagnosis not present

## 2016-11-26 DIAGNOSIS — M25542 Pain in joints of left hand: Secondary | ICD-10-CM | POA: Diagnosis not present

## 2016-11-26 DIAGNOSIS — E1129 Type 2 diabetes mellitus with other diabetic kidney complication: Secondary | ICD-10-CM | POA: Diagnosis not present

## 2016-11-26 DIAGNOSIS — R809 Proteinuria, unspecified: Secondary | ICD-10-CM | POA: Diagnosis not present

## 2016-11-26 DIAGNOSIS — I10 Essential (primary) hypertension: Secondary | ICD-10-CM | POA: Diagnosis not present

## 2016-12-17 DIAGNOSIS — M79642 Pain in left hand: Secondary | ICD-10-CM | POA: Diagnosis not present

## 2016-12-17 DIAGNOSIS — M150 Primary generalized (osteo)arthritis: Secondary | ICD-10-CM | POA: Diagnosis not present

## 2016-12-17 DIAGNOSIS — M79641 Pain in right hand: Secondary | ICD-10-CM | POA: Diagnosis not present

## 2016-12-17 DIAGNOSIS — M65332 Trigger finger, left middle finger: Secondary | ICD-10-CM | POA: Diagnosis not present

## 2016-12-20 DIAGNOSIS — Z95810 Presence of automatic (implantable) cardiac defibrillator: Secondary | ICD-10-CM | POA: Diagnosis not present

## 2016-12-20 DIAGNOSIS — I10 Essential (primary) hypertension: Secondary | ICD-10-CM | POA: Diagnosis not present

## 2016-12-20 DIAGNOSIS — I428 Other cardiomyopathies: Secondary | ICD-10-CM | POA: Diagnosis not present

## 2016-12-30 ENCOUNTER — Encounter: Payer: Self-pay | Admitting: Occupational Therapy

## 2016-12-30 ENCOUNTER — Ambulatory Visit: Payer: Medicare HMO | Attending: Rheumatology | Admitting: Occupational Therapy

## 2016-12-30 DIAGNOSIS — M79642 Pain in left hand: Secondary | ICD-10-CM | POA: Diagnosis not present

## 2016-12-30 DIAGNOSIS — M6281 Muscle weakness (generalized): Secondary | ICD-10-CM | POA: Diagnosis present

## 2016-12-30 DIAGNOSIS — R278 Other lack of coordination: Secondary | ICD-10-CM

## 2016-12-31 NOTE — Therapy (Signed)
[facility] [address], [place], [address] Phone: [phone]   Fax:  [phone]  Occupational Therapy Evaluation  Patient Details  Name: Morgan Hubbard MRN: [id number] Date of Birth: 07/24/1934 Referring Provider: [name]  Encounter Date: 12/30/2016      OT End of Session - 12/31/16 2204    Visit Number 1   Number of Visits 12   Date for OT Re-Evaluation 02/10/17   Authorization Type Medicare G code 1   OT Start Time 0954   OT Stop Time 1055   OT Time Calculation (min) 61 min   Activity Tolerance Patient tolerated treatment well   Behavior During Therapy [facility] for tasks assessed/performed      Past Medical History:  Diagnosis Date  . Arthritis    "hands" (01/13/2014)  . Basal cell carcinoma 01/2014   "bridge of nose"  . Cardiac LV ejection fraction >40%    "it was 43 last year" (01/13/2014)  . Chronic lower back pain   . Colon polyps   . Expressive aphasia    "3 times in the last week" (01/13/2014)  . GERD (gastroesophageal reflux disease)   . Goiter past remote   treated with RI  . Hyperlipidemia   . Hyperpotassemia   . Hypertension   . Hypertonicity of bladder   . Inflammatory and toxic neuropathy, unspecified   . LBBB (left bundle branch block)   . Migraine    "stopped many years ago" (01/13/2014)  . Mini stroke ([place]) 01/2014  . Other primary cardiomyopathies   . Overactive bladder   . Presence of combination internal cardiac defibrillator (ICD) and pacemaker   . Reflex sympathetic dystrophy, unspecified   . Shingles   . Shortness of breath    ambulation  . Stroke ([place])   . Thyroid disease   . Type II diabetes mellitus ([place])   . Ulcer   . Urine incontinence   . Vertigo    hx of    Past Surgical History:  Procedure Laterality Date  . BACK SURGERY    . BI-VENTRICULAR PACEMAKER INSERTION (CRT-P)  10/2014   [facility]  . BREAST CYST EXCISION Left 1959  . CARDIAC CATHETERIZATION   "several"   [place]   . CATARACT EXTRACTION W/ INTRAOCULAR LENS IMPLANT Left ~ 2005   several eye injections  . COLONOSCOPY  7/12   normal (hx of polyps in past)   . CT SCAN  3/12   outside hosp- lung nodule and gallstones  . [place]   "knot on index"  . KIDNEY DONATION Left 1989  . LUMBAR LAMINECTOMY/DECOMPRESSION MICRODISCECTOMY  1999  . LUMBAR LAMINECTOMY/DECOMPRESSION MICRODISCECTOMY  01/31/2012   Procedure: LUMBAR LAMINECTOMY/DECOMPRESSION MICRODISCECTOMY 2 LEVELS;  Surgeon: Eustace Moore, MD;  Location: [place] [facility];  Service: Neurosurgery;  Laterality: N/A;  Thoracic twelve-lumbar one, lumbar one-two laminectomy   . POSTERIOR LAMINECTOMY / DECOMPRESSION CERVICAL SPINE  1999  . TUBAL LIGATION  1976  . UPPER GASTROINTESTINAL ENDOSCOPY      There were no vitals filed for this visit.      Subjective Assessment - 12/31/16 2126    Subjective  Patient reports she came to therapy after having pain in her left third finger which bothers her more at night.  She feels like it gets "stuck" and at feels like she cannot completely extend it.  She also has issues with right hand thumb and index finger MCP.  She reports difficulty with  kitchen tasks such as cutting vegetables and peeling peaches.     Patient Stated Goals Patient reports she would like to be as independent as possible and get rid of pain in her hands.    Currently in Pain? Yes   Pain Score 3    Pain Location Finger (Comment which one)   Pain Orientation Left   Pain Descriptors / Indicators Aching   Pain Type Acute pain   Pain Radiating Towards pain at the base of the left 3 finger at the A 1 pulley   Pain Onset More than a month ago   Pain Frequency Intermittent   Effect of Pain on Daily Activities difficulty with kitchen tasks and using hand functionally   Multiple Pain Sites Yes   Pain Score 4   Pain Location Finger (Comment which one)   Pain Orientation Right   Pain Descriptors / Indicators  Aching   Pain Type Chronic pain   Pain Radiating Towards pain at thumb and at the MCP of the index finger.   Pain Onset More than a month ago           [facility] OT Assessment - 12/31/16 2132      Assessment   Diagnosis left trigger finger 3rd digit, pain right thumb and index at [facility]   Referring Provider [facility]   Onset Date 11/06/16     Precautions   Precautions Fall     Balance Screen   Has the patient fallen in the past 6 months Yes   How many times? 1   Has the patient had a decrease in activity level because of a fear of falling?  No   Is the patient reluctant to leave their home because of a fear of falling?  No     Home  Environment   Family/patient expects to be discharged to: Private residence   Living Arrangements Spouse/significant other   Available Help at Discharge Family   Type of [place] One level   [place] - Number of Steps 3   Bathroom Shower/Tub [place] - 2 wheels;Walker - 4 wheels;Cane - single point;Tub bench;Toilet riser   Lives With Spouse     Prior Function   Level of Independence Independent   Vocation Retired     ADL   ADL comments Patient reports she travels [place] with her husband, uses a walking stick at times and a rollator when touring and going longer distances.  Her husband tends to do most of the cooking at home.  She occasionally requires assistance with cutting her food, opening jars, containers and bottles.  She writes alot and has pain on the right hand.  She tends to have night time pain which affects her ability to sleep.  She has bilateral neuropathy in her feet.  She has difficulty with cutting vegetables and peeling tomatoes, peaches.       IADL   Prior Level of Function Shopping independent   Shopping Shops independently for small purchases   Prior Level of Function Light Housekeeping independent   Light Housekeeping Performs light daily tasks such  as dishwashing, bed making   Meal Prep Needs to have meals prepared and served   [employer] own vehicle   Medication Management Is responsible for taking medication in correct dosages at correct time     Mobility   Mobility Status History of falls;Needs assist  Written Expression   Dominant Hand Right     Sensation   Light Touch Appears Intact     ROM / Strength   AROM / PROM / Strength AROM;PROM;Strength     AROM   Overall AROM Comments patient reports left small finger was previously affected after neck surgery, decreased PIP flexion.     AROM Assessment Site Wrist   Right/Left Wrist Right   Right Wrist Extension 55 Degrees   Right Wrist Flexion 50 Degrees   Right Wrist Radial Deviation 20 Degrees   Right Wrist Ulnar Deviation 30 Degrees   Left Wrist Extension 50 Degrees   Left Wrist Flexion 60 Degrees   Left Wrist Radial Deviation 10 Degrees   Left Wrist Ulnar Deviation 25 Degrees     Right Hand AROM   R Thumb MCP 0-60 42 Degrees   R Thumb IP 0-80 65 Degrees   R Thumb Radial ABduction/ADduction 0-55 50   R Thumb Palmar ABduction/ADduction 0-45 45   R Index  MCP 0-90 80 Degrees   R Index PIP 0-100 90 Degrees   R Long  MCP 0-90 80 Degrees   R Long PIP 0-100 95 Degrees   R Ring  MCP 0-90 80 Degrees   R Ring PIP 0-100 95 Degrees   R Little  MCP 0-90 90 Degrees   R Little PIP 0-100 85 Degrees     Left Hand AROM   L Thumb MCP 0-60 35 Degrees   L Thumb IP 0-80 65 Degrees   L Thumb Radial ADduction/ABduction 0-55 48   L Thumb Palmar ADduction/ABduction 0-45 45   L Index  MCP 0-90 90 Degrees   L Index PIP 0-100 90 Degrees   L Long  MCP 0-90 90 Degrees   L Long PIP 0-100 85 Degrees   L Ring  MCP 0-90 90 Degrees   L Ring PIP 0-100 85 Degrees   L Little  MCP 0-90 90 Degrees   L Little PIP 0-100 95 Degrees     Hand Function   Right Hand Grip (lbs) 22   Right Hand Lateral Pinch 7 lbs   Right Hand 3 Point Pinch 4 lbs   Left Hand Grip (lbs) 19    Left Hand Lateral Pinch 5 lbs   Left 3 point pinch 4 lbs      Performed fluidotherapy to left hand for 10 minutes with cues for finger extension exercises while paraffin bath to right hand to decrease pain and increase motion.   Patient seen for ROM exercises after fluidotherapy for finger extension, tendon glides, no pulling or tight fisting.  Issued HEP for tendon gliding exercises.                    OT Education - 12/31/16 2203    Education provided Yes   Education Details built up surfaces for tool use, no squeezing, be aware of sleeping position   Person(s) Educated Patient   Methods Explanation;Demonstration;Verbal cues   Comprehension Verbal cues required;Returned demonstration;Verbalized understanding             OT Long Term Goals - 12/31/16 2217      OT LONG TERM GOAL #1   Title Patient will demonstrate home exercise program with modified independence.     Baseline no current program   Time 6   Period Weeks   Status New   Target Date 01/20/17     OT LONG TERM GOAL #2   Title Patient will  report pain in bilateral hands 2/10 or less after therapeutic intervention.     Time 4   Period Weeks   Status New   Target Date 01/20/17     OT LONG TERM GOAL #3   Title Patient will demonstrate ability to open jars/bottles and containers with occasional min assist and/or use of adaptive equipment as needed.    Baseline mod to max assist.   Time 6   Period Weeks   Status New   Target Date 02/10/17     OT LONG TERM GOAL #4   Title Patient will demonstrate peeling peaches/tomatoes with modified independence.   Baseline increased difficulty and pain at evaluation.   Time 6   Period Weeks   Status New   Target Date 02/10/17               Plan - 12/31/16 2203/09/14    Clinical Impression Statement Patient is a 82 yo female with extensive medical history, osteoarthritis with increased pain over the last few months in the following areas:  left hand 3rd  digit, right hand thumb and index pain at MCP.  Patient also presents with decreased active range of motion at the digits, wrist, decreased strength impacting her functional use of both hands for daily tasks.  She has some triggering in left hand with tenderness at the A1 pulley and also had Dupuytrens nodules present.  Patient would benefit from skilled OT to decrease pain, increase motion, provide joint protection principles to improve functional hand use for bilateral UEs.   Occupational Profile and client history currently impacting functional performance previous neck injury affecting small finger on left, arthritis and pain in bilateral hands, pain, Dupuytrens nodules in left palm with triggering, tender at the A1 pulley   Occupational performance deficits (Please refer to evaluation for details): ADL's;IADL's;Rest and Sleep;Leisure   Rehab Potential Fair   Current Impairments/barriers affecting progress: age, comorbidities,    OT Frequency 2x / week   OT Duration 6 weeks   OT Treatment/Interventions Self-care/ADL training;Moist Heat;Fluidtherapy;DME and/or AE instruction;Splinting;Patient/family education;Contrast Bath;Therapeutic exercises;Therapeutic exercise;Therapeutic activities;Cryotherapy;Neuromuscular education;Passive range of motion;Parrafin;Energy conservation;Manual Therapy   Clinical Decision Making Several treatment options, min-mod task modification necessary   Consulted and Agree with Plan of Care Patient      Patient will benefit from skilled therapeutic intervention in order to improve the following deficits and impairments:  Decreased coordination, Decreased range of motion, Impaired flexibility, Decreased activity tolerance, Decreased knowledge of use of DME, Impaired UE functional use, Pain, Decreased strength  Visit Diagnosis: Pain in left hand  Muscle weakness (generalized)  Other lack of coordination      G-Codes - 01-19-2017 Sep 14, 2219    Functional Assessment Tool  Used (Outpatient only) clinical judgment, ROM assessment, ADL assessment   Functional Limitation Self care   Self Care Current Status (G2952) At least 20 percent but less than 40 percent impaired, limited or restricted   Self Care Goal Status (W4132) At least 1 percent but less than 20 percent impaired, limited or restricted      Problem List Patient Active Problem List   Diagnosis Date Noted  . Osteopenia 06/16/2016  . Routine general medical examination at a health care facility 04/24/2016  . Post-menopausal bleeding 04/24/2016  . Estrogen deficiency 04/24/2016  . Urticaria 11/03/2015  . Type II diabetes mellitus ([place]) 06/21/2015  . Low blood magnesium 11/04/2014  . Encounter for Medicare annual wellness exam 06/13/2014  . Cough 05/31/2014  . Stroke, small vessel ([place]) 01/18/2014  .  Family history of hemochromatosis 01/18/2014  . Cerebral thrombosis with cerebral infarction ([place]) 01/14/2014  . Expressive aphasia 01/13/2014  . Nonischemic cardiomyopathy ([place]) 04/05/2013  . Shortness of breath 09/03/2012  . Gallstones 05/26/2012  . Elevated transaminase level 05/18/2012  . Spinal stenosis of lumbar region 12/10/2011  . Mixed incontinence urge and stress 12/10/2011  . Renal insufficiency 11/04/2011  . Goiter 01/14/2011  . Fatty liver 08/28/2010  . DM 07/09/2010  . HYPERCHOLESTEROLEMIA 07/09/2010  . HYPERKALEMIA 07/09/2010  . REFLEX SYMPATHETIC DYSTROPHY 07/09/2010  . POLYNEUROPATHY 07/09/2010  . Essential hypertension 07/09/2010  . CARDIOMYOPATHY 07/09/2010  . OVERACTIVE BLADDER 07/09/2010   Eara Burruel T Adoni Greenough, OTR/L, CLT  Ranetta Armacost 12/31/2016, 10:26 PM  [facility] [address], [place], [address] Phone: [phone]   Fax:  [phone]  Name: AADYA KINDLER MRN: [id number] Date of Birth: 11-22-1934

## 2016-12-31 NOTE — Addendum Note (Signed)
Addended by: Garlon Hatchet T on: 12/31/2016 10:31 PM   Modules accepted: Orders

## 2017-01-01 ENCOUNTER — Encounter: Payer: Self-pay | Admitting: Occupational Therapy

## 2017-01-01 ENCOUNTER — Ambulatory Visit: Payer: Medicare HMO | Attending: Rheumatology | Admitting: Occupational Therapy

## 2017-01-01 DIAGNOSIS — M6281 Muscle weakness (generalized): Secondary | ICD-10-CM | POA: Diagnosis not present

## 2017-01-01 DIAGNOSIS — M79642 Pain in left hand: Secondary | ICD-10-CM | POA: Diagnosis not present

## 2017-01-01 DIAGNOSIS — R278 Other lack of coordination: Secondary | ICD-10-CM

## 2017-01-02 ENCOUNTER — Other Ambulatory Visit: Payer: Self-pay | Admitting: Family Medicine

## 2017-01-02 NOTE — Telephone Encounter (Signed)
Please schedule f/u for early dec and refill until then  Please clarify the tessalon with her

## 2017-01-02 NOTE — Telephone Encounter (Signed)
Appt scheduled and med refilled, tessalon was requested in error, med denied

## 2017-01-02 NOTE — Telephone Encounter (Signed)
CPE was done on 04/24/16 but no appt since, and no future appts, gabapentin was last filled on 04/16/16 #270 caps with 1 additional refills, tessalon not on med list, please advise

## 2017-01-04 NOTE — Therapy (Signed)
[facility] [address] [place], [address] Phone: [phone]   Fax:  [phone]  Occupational Therapy Treatment  Patient Details  Name: Morgan Hubbard MRN: [id number] Date of Birth: [birth year] Referring Provider: [name]  Encounter Date: 01/01/2017    Past Medical History:  Diagnosis Date  . Arthritis    "hands" (01/13/2014)  . Basal cell carcinoma 01/2014   "bridge of nose"  . Cardiac LV ejection fraction >40%    "it was 43 last year" (01/13/2014)  . Chronic lower back pain   . Colon polyps   . Expressive aphasia    "3 times in the last week" (01/13/2014)  . GERD (gastroesophageal reflux disease)   . Goiter past remote   treated with RI  . Hyperlipidemia   . Hyperpotassemia   . Hypertension   . Hypertonicity of bladder   . Inflammatory and toxic neuropathy, unspecified   . LBBB (left bundle branch block)   . Migraine    "stopped many years ago" (01/13/2014)  . Mini stroke ([facility]) 01/2014  . Other primary cardiomyopathies   . Overactive bladder   . Presence of combination internal cardiac defibrillator (ICD) and pacemaker   . Reflex sympathetic dystrophy, unspecified   . Shingles   . Shortness of breath    ambulation  . Stroke ([place])   . Thyroid disease   . Type II diabetes mellitus ([place])   . Ulcer   . Urine incontinence   . Vertigo    hx of    Past Surgical History:  Procedure Laterality Date  . BACK SURGERY    . BI-VENTRICULAR PACEMAKER INSERTION (CRT-P)  10/2014   [facility]  . BREAST CYST EXCISION Left 1959  . CARDIAC CATHETERIZATION  "several"   [place]  . CATARACT EXTRACTION W/ INTRAOCULAR LENS IMPLANT Left ~ 2005   several eye injections  . COLONOSCOPY  7/12   normal (hx of polyps in past)   . CT SCAN  3/12   outside hosp- lung nodule and gallstones  . [place]   "knot on index"  . KIDNEY DONATION Left 1989  . LUMBAR LAMINECTOMY/DECOMPRESSION  MICRODISCECTOMY  1999  . LUMBAR LAMINECTOMY/DECOMPRESSION MICRODISCECTOMY  01/31/2012   Procedure: LUMBAR LAMINECTOMY/DECOMPRESSION MICRODISCECTOMY 2 LEVELS;  Surgeon: Eustace Moore, MD;  Location: [facility];  Service: Neurosurgery;  Laterality: N/A;  Thoracic twelve-lumbar one, lumbar one-two laminectomy   . POSTERIOR LAMINECTOMY / DECOMPRESSION CERVICAL SPINE  1999  . TUBAL LIGATION  1976  . UPPER GASTROINTESTINAL ENDOSCOPY      There were no vitals filed for this visit.    Performed fluidotherapy to left hand for 10 minutes with cues for finger extension exercises while paraffin bath to right hand  for 10 minutes to decrease pain and increase motion.   Patient seen for ROM exercises after fluidotherapy for finger extension, tendon glides, no pulling or tight fisting.   Reinstructed on HEP for tendon gliding exercises.   Patient seen for instruction and demonstration of general joint protection principles as well as task modification in the kitchen.  Issued handouts for both with patient demonstrating understanding.  Issued info on bag holder with large grip to decrease pressure on joints when holding plastic grocery bags.   US performed to left base of 3rd finger over A1 pulley,  20% , 3.3MHZ, .8 intensity to decrease edema and pain for 4 mins.  OT Education - 01/04/17 1558    Education provided Yes   Education Details home exercises, joint protection principles general and for the kitchen.     Person(s) Educated Patient   Methods Explanation;Demonstration;Verbal cues;Handout   Comprehension Verbalized understanding;Returned demonstration;Verbal cues required             OT Long Term Goals - 12/31/16 2217      OT LONG TERM GOAL #1   Title Patient will demonstrate home exercise program with modified independence.     Baseline no current program   Time 6   Period Weeks   Status New   Target Date 01/20/17     OT LONG TERM GOAL  #2   Title Patient will report pain in bilateral hands 2/10 or less after therapeutic intervention.     Time 4   Period Weeks   Status New   Target Date 01/20/17     OT LONG TERM GOAL #3   Title Patient will demonstrate ability to open jars/bottles and containers with occasional min assist and/or use of adaptive equipment as needed.    Baseline mod to max assist.   Time 6   Period Weeks   Status New   Target Date 02/10/17     OT LONG TERM GOAL #4   Title Patient will demonstrate peeling peaches/tomatoes with modified independence.   Baseline increased difficulty and pain at evaluation.   Time 6   Period Weeks   Status New   Target Date 02/10/17               Plan - 01/04/17 1559    Clinical Impression Statement Patient continues to have some tenderness at the base of the left MF at MCP joint, she reports the use of paraffin and fluidotherapy help with preparing the hand for ROM and movement.  Able to demonstrate understanding of joint protection principles, issued handouts for general joint protection principles, modifications for kitchen and focused on how to pick up items, hold grocery bags and use of adaptive equipment to lessen the pressure and torque on her joints.  She continues to benefit from OT to increase independence in daily tasks.     Rehab Potential Fair   Current Impairments/barriers affecting progress: age, comorbidities,    OT Frequency 2x / week   OT Duration 6 weeks   OT Treatment/Interventions Self-care/ADL training;Moist Heat;Fluidtherapy;DME and/or AE instruction;Splinting;Patient/family education;Contrast Bath;Therapeutic exercises;Therapeutic exercise;Therapeutic activities;Cryotherapy;Neuromuscular education;Passive range of motion;Parrafin;Energy conservation;Manual Therapy   Consulted and Agree with Plan of Care Patient      Patient will benefit from skilled therapeutic intervention in order to improve the following deficits and impairments:   Decreased coordination, Decreased range of motion, Impaired flexibility, Decreased activity tolerance, Decreased knowledge of use of DME, Impaired UE functional use, Pain, Decreased strength  Visit Diagnosis: Muscle weakness (generalized)  Other lack of coordination  Pain in left hand    Problem List Patient Active Problem List   Diagnosis Date Noted  . Osteopenia 06/16/2016  . Routine general medical examination at a health care facility 04/24/2016  . Post-menopausal bleeding 04/24/2016  . Estrogen deficiency 04/24/2016  . Urticaria 11/03/2015  . Type II diabetes mellitus ([place]) 06/21/2015  . Low blood magnesium 11/04/2014  . Encounter for Medicare annual wellness exam 06/13/2014  . Cough 05/31/2014  . Stroke, small vessel ([place]) 01/18/2014  . Family history of hemochromatosis 01/18/2014  . Cerebral thrombosis with cerebral infarction ([place]) 01/14/2014  . Expressive aphasia 01/13/2014  . Nonischemic cardiomyopathy ([place])  04/05/2013  . Shortness of breath 09/03/2012  . Gallstones 05/26/2012  . Elevated transaminase level 05/18/2012  . Spinal stenosis of lumbar region 12/10/2011  . Mixed incontinence urge and stress 12/10/2011  . Renal insufficiency 11/04/2011  . Goiter 01/14/2011  . Fatty liver 08/28/2010  . DM 07/09/2010  . HYPERCHOLESTEROLEMIA 07/09/2010  . HYPERKALEMIA 07/09/2010  . REFLEX SYMPATHETIC DYSTROPHY 07/09/2010  . POLYNEUROPATHY 07/09/2010  . Essential hypertension 07/09/2010  . CARDIOMYOPATHY 07/09/2010  . OVERACTIVE BLADDER 07/09/2010   Amy T Lovett, OTR/L, CLT  Lovett,Amy 01/04/2017, 4:07 PM  [facility] [address], [place], [address] Phone: [phone]   Fax:  [phone]  Name: BENTLIE WITHEM MRN: [id number] Date of Birth: [birth year]

## 2017-01-09 ENCOUNTER — Ambulatory Visit: Payer: Medicare HMO | Admitting: Occupational Therapy

## 2017-01-09 DIAGNOSIS — M79642 Pain in left hand: Secondary | ICD-10-CM

## 2017-01-09 DIAGNOSIS — R278 Other lack of coordination: Secondary | ICD-10-CM | POA: Diagnosis not present

## 2017-01-09 DIAGNOSIS — M6281 Muscle weakness (generalized): Secondary | ICD-10-CM | POA: Diagnosis not present

## 2017-01-09 NOTE — Patient Instructions (Addendum)
L hand 3rd digit DIP flexion PROM and blocked AROM  PROM for PIP extention  Tendon glides with focus on extention  Ice massage over A1pulley for 3rd  At to HEP   R hand joint protection and avoid lat grip and rep 3 point grip  Review with pt and reinforce  Contrast to be done at home  Tendon glides - fisting 2 fingers out of palm - then one gradually at home  do not force and no pain

## 2017-01-09 NOTE — Therapy (Signed)
[place] [facility] [address], [place], [address] Phone: [phone]   Fax:  [phone]  Occupational Therapy Treatment  Patient Details  Name: Morgan Hubbard MRN: [id number] Date of Birth: 1934-07-12 Referring Provider: [name]  Encounter Date: 01/09/2017      OT End of Session - 01/09/17 1847    Visit Number 3   Number of Visits 12   Date for OT Re-Evaluation 02/10/17   OT Start Time 1612   OT Stop Time 1703   OT Time Calculation (min) 51 min   Activity Tolerance Patient tolerated treatment well   Behavior During Therapy [facility] for tasks assessed/performed      Past Medical History:  Diagnosis Date  . Arthritis    "hands" (01/13/2014)  . Basal cell carcinoma 01/2014   "bridge of nose"  . Cardiac LV ejection fraction >40%    "it was 43 last year" (01/13/2014)  . Chronic lower back pain   . Colon polyps   . Expressive aphasia    "3 times in the last week" (01/13/2014)  . GERD (gastroesophageal reflux disease)   . Goiter past remote   treated with RI  . Hyperlipidemia   . Hyperpotassemia   . Hypertension   . Hypertonicity of bladder   . Inflammatory and toxic neuropathy, unspecified   . LBBB (left bundle branch block)   . Migraine    "stopped many years ago" (01/13/2014)  . Mini stroke ([place]) 01/2014  . Other primary cardiomyopathies   . Overactive bladder   . Presence of combination internal cardiac defibrillator (ICD) and pacemaker   . Reflex sympathetic dystrophy, unspecified   . Shingles   . Shortness of breath    ambulation  . Stroke ([place])   . Thyroid disease   . Type II diabetes mellitus ([place])   . Ulcer   . Urine incontinence   . Vertigo    hx of    Past Surgical History:  Procedure Laterality Date  . BACK SURGERY    . BI-VENTRICULAR PACEMAKER INSERTION (CRT-P)  10/2014   [facility]  . BREAST CYST EXCISION Left 1959  . CARDIAC CATHETERIZATION  "several"   [place]  . CATARACT EXTRACTION  W/ INTRAOCULAR LENS IMPLANT Left ~ 2005   several eye injections  . COLONOSCOPY  7/12   normal (hx of polyps in past)   . CT SCAN  3/12   outside hosp- lung nodule and gallstones  . [place]   "knot on index"  . KIDNEY DONATION Left 1989  . LUMBAR LAMINECTOMY/DECOMPRESSION MICRODISCECTOMY  1999  . LUMBAR LAMINECTOMY/DECOMPRESSION MICRODISCECTOMY  01/31/2012   Procedure: LUMBAR LAMINECTOMY/DECOMPRESSION MICRODISCECTOMY 2 LEVELS;  Surgeon: Eustace Moore, MD;  Location: [facility];  Service: Neurosurgery;  Laterality: N/A;  Thoracic twelve-lumbar one, lumbar one-two laminectomy   . POSTERIOR LAMINECTOMY / DECOMPRESSION CERVICAL SPINE  1999  . TUBAL LIGATION  1976  . UPPER GASTROINTESTINAL ENDOSCOPY      There were no vitals filed for this visit.      Subjective Assessment - 01/09/17 1836    Subjective  These hands hurt - but I think this middle finger on L is maybe little more straigth - the index and middle finger on R the worse    Patient Stated Goals Patient reports she would like to be as independent as possible and get rid of pain in her hands.    Currently in Pain? Yes   Pain  Score 3    Pain Location Hand   Pain Orientation Right   Pain Descriptors / Indicators Aching   Pain Type Chronic pain                      OT Treatments/Exercises (OP) - 01/09/17 0001      RUE Paraffin   Number Minutes Paraffin 10 Minutes   RUE Paraffin Location Hand   Comments decrease pain      LUE Fluidotherapy   Number Minutes Fluidotherapy 10 Minutes   LUE Fluidotherapy Location Hand   Comments increase ROM       After parafin and fluido Done L hand 3rd digit DIP flexion PROM and blocked AROM  PROM for PIP extention  Tendon glides with focus on extention  Ice massage over A1pulley for 3rd  At to HEP   R hand joint protection and avoid lat grip and rep 3 point grip  Review with pt and reinforce  Contrast to be done at home  Tendon glides -  fisting 2 fingers out of palm - then one gradually at home  do not force and no pain               OT Education - 01/09/17 1847    Education provided Yes   Education Details HEP changes   Person(s) Educated Patient   Methods Explanation;Demonstration;Tactile cues;Verbal cues;Handout   Comprehension Verbal cues required;Returned demonstration;Verbalized understanding             OT Long Term Goals - 12/31/16 2217      OT LONG TERM GOAL #1   Title Patient will demonstrate home exercise program with modified independence.     Baseline no current program   Time 6   Period Weeks   Status New   Target Date 01/20/17     OT LONG TERM GOAL #2   Title Patient will report pain in bilateral hands 2/10 or less after therapeutic intervention.     Time 4   Period Weeks   Status New   Target Date 01/20/17     OT LONG TERM GOAL #3   Title Patient will demonstrate ability to open jars/bottles and containers with occasional min assist and/or use of adaptive equipment as needed.    Baseline mod to max assist.   Time 6   Period Weeks   Status New   Target Date 02/10/17     OT LONG TERM GOAL #4   Title Patient will demonstrate peeling peaches/tomatoes with modified independence.   Baseline increased difficulty and pain at evaluation.   Time 6   Period Weeks   Status New   Target Date 02/10/17               Plan - 01/09/17 1848    Clinical Impression Statement Pt cont to have pain in R hand 2nd and 3rd digit - and decrease extention at L 4th PIP and tenderness over A1pulley - review and modify HEP - reinforce importance to decrease pain - joint protection and  contrast at R -    Occupational performance deficits (Please refer to evaluation for details): ADL's;IADL's;Rest and Sleep;Play;Leisure   Rehab Potential Fair   Current Impairments/barriers affecting progress: age, comorbidities,    OT Frequency 2x / week   OT Duration 4 weeks   OT Treatment/Interventions  Self-care/ADL training;Moist Heat;Fluidtherapy;DME and/or AE instruction;Splinting;Patient/family education;Contrast Bath;Therapeutic exercises;Therapeutic exercise;Therapeutic activities;Cryotherapy;Neuromuscular education;Passive range of motion;Parrafin;Energy conservation;Manual Therapy   Plan assess progress with  HEP    Clinical Decision Making Several treatment options, min-mod task modification necessary   OT Home Exercise Plan see pt instruction    Consulted and Agree with Plan of Care Patient      Patient will benefit from skilled therapeutic intervention in order to improve the following deficits and impairments:  Decreased coordination, Decreased range of motion, Impaired flexibility, Decreased activity tolerance, Decreased knowledge of use of DME, Impaired UE functional use, Pain, Decreased strength  Visit Diagnosis: Muscle weakness (generalized)  Other lack of coordination  Pain in left hand    Problem List Patient Active Problem List   Diagnosis Date Noted  . Osteopenia 06/16/2016  . Routine general medical examination at a health care facility 04/24/2016  . Post-menopausal bleeding 04/24/2016  . Estrogen deficiency 04/24/2016  . Urticaria 11/03/2015  . Type II diabetes mellitus ([place]) 06/21/2015  . Low blood magnesium 11/04/2014  . Encounter for Medicare annual wellness exam 06/13/2014  . Cough 05/31/2014  . Stroke, small vessel ([place]) 01/18/2014  . Family history of hemochromatosis 01/18/2014  . Cerebral thrombosis with cerebral infarction ([place]) 01/14/2014  . Expressive aphasia 01/13/2014  . Nonischemic cardiomyopathy ([place]) 04/05/2013  . Shortness of breath 09/03/2012  . Gallstones 05/26/2012  . Elevated transaminase level 05/18/2012  . Spinal stenosis of lumbar region 12/10/2011  . Mixed incontinence urge and stress 12/10/2011  . Renal insufficiency 11/04/2011  . Goiter 01/14/2011  . Fatty liver 08/28/2010  . DM 07/09/2010  . HYPERCHOLESTEROLEMIA 07/09/2010   . HYPERKALEMIA 07/09/2010  . REFLEX SYMPATHETIC DYSTROPHY 07/09/2010  . POLYNEUROPATHY 07/09/2010  . Essential hypertension 07/09/2010  . CARDIOMYOPATHY 07/09/2010  . OVERACTIVE BLADDER 07/09/2010    Rosalyn Gess OTR/L,CLT  01/09/2017, 6:50 PM  [place] [facility] [address], [place], [address] Phone: [phone]   Fax:  [phone]  Name: Morgan Hubbard MRN: [id number] Date of Birth: Apr 04, 1935

## 2017-01-16 ENCOUNTER — Ambulatory Visit: Payer: Medicare HMO | Admitting: Occupational Therapy

## 2017-01-17 DIAGNOSIS — Z95810 Presence of automatic (implantable) cardiac defibrillator: Secondary | ICD-10-CM | POA: Diagnosis not present

## 2017-01-17 DIAGNOSIS — Z4502 Encounter for adjustment and management of automatic implantable cardiac defibrillator: Secondary | ICD-10-CM | POA: Diagnosis not present

## 2017-01-17 DIAGNOSIS — I428 Other cardiomyopathies: Secondary | ICD-10-CM | POA: Diagnosis not present

## 2017-01-17 DIAGNOSIS — R0602 Shortness of breath: Secondary | ICD-10-CM | POA: Diagnosis not present

## 2017-01-17 DIAGNOSIS — I498 Other specified cardiac arrhythmias: Secondary | ICD-10-CM | POA: Diagnosis not present

## 2017-01-17 DIAGNOSIS — Z8249 Family history of ischemic heart disease and other diseases of the circulatory system: Secondary | ICD-10-CM | POA: Diagnosis not present

## 2017-02-05 DIAGNOSIS — R69 Illness, unspecified: Secondary | ICD-10-CM | POA: Diagnosis not present

## 2017-02-20 DIAGNOSIS — E1129 Type 2 diabetes mellitus with other diabetic kidney complication: Secondary | ICD-10-CM | POA: Diagnosis not present

## 2017-02-20 DIAGNOSIS — E1165 Type 2 diabetes mellitus with hyperglycemia: Secondary | ICD-10-CM | POA: Diagnosis not present

## 2017-02-20 DIAGNOSIS — R809 Proteinuria, unspecified: Secondary | ICD-10-CM | POA: Diagnosis not present

## 2017-02-20 DIAGNOSIS — Z794 Long term (current) use of insulin: Secondary | ICD-10-CM | POA: Diagnosis not present

## 2017-02-24 DIAGNOSIS — N3020 Other chronic cystitis without hematuria: Secondary | ICD-10-CM | POA: Diagnosis not present

## 2017-02-24 DIAGNOSIS — R351 Nocturia: Secondary | ICD-10-CM | POA: Diagnosis not present

## 2017-02-24 DIAGNOSIS — N3281 Overactive bladder: Secondary | ICD-10-CM | POA: Diagnosis not present

## 2017-02-25 DIAGNOSIS — I5022 Chronic systolic (congestive) heart failure: Secondary | ICD-10-CM | POA: Insufficient documentation

## 2017-02-25 NOTE — Progress Notes (Signed)
Cardiology Office Note  Date:  02/27/2017   ID:  Morgan Hubbard, DOB 01-30-1935, MRN [id number]  PCP:  Abner Greenspan, MD   Chief Complaint  Patient presents with  . other    6 month f/u c/o sob. Meds reviewed verbally with pt.    HPI:  Morgan Hubbard is a very pleasant 82 year old woman with past medical history of  nonischemic cardiomyopathy, Biventricular ICD placed May 2016  Cath 2012 with no CAD ejection fraction of 42% in 5956 of uncertain etiology ,  left bundle branch block,  TIA/stroke August 2015. had difficulty speaking, Walking nephrectomy/donated kidney in 1989,  possible angioedema on lisinopril,  back surgery x3,  \minimal carotid b/l in 2015, carotid bruit on the right poorly controlled diabetes Hemoglobin A1c 9.4 to 7.9, She presents for routine followup Of her cardiomyopathy Ejection fraction of 15-20% in 2015, Up to 35% in August 2016, Up to 40% in 2017  She is followed by EP and her cardiology at [facility] to follow-up with our office approximately once a year She has endocrinology, primary care  No regular exercise, very deconditioned, eating more, weight up Does not like taking Lasix secondary to inconvenience of polyuria Gait instability, getting worse On her last office visit was waking up in the night short of breath me would relax it would slowly go away  Occasionally has ankle swelling  denies abdominal bloating, PND orthopnea  Recent device changes through EP at [facility], records reviewed with her No recent medication changes, tolerating her current medications  Lab work reviewed with her HBA1C 8.6, trending upwards  EKG Personally reviewed by myself on today's visit shows paced rhythm rate 71 bpm  Other past medical history reviewed TIA/stroke August 2015. had difficulty speaking, Walking Initially placed on aspirin, Plavix, been changed by neurology to aspirin 325 mg daily , Plavix held She continues to follow-up at [facility]   with cardiology and EP Biventricular ICD placed May 2016 for shortness of breath symptoms placed by Dr. Beather Arbour  echocardiogram from August 2016 reviewed with her from [facility] showing ejection fraction 35% , no significant valve disease   recent echocardiogram performed at [facility] from 2017 showing ejection fraction 40%  Previous hemoglobin A1c 8.1,  managed by endocrinology Previously had low magnesium 1.3, was taking supplement, no longer taking supplement as this ran out. Total cholesterol general 2016 was 86 , takes Lipitor 80 mg daily  other past medical history History ofsevere back disease she has significant neuropathy in her legs.   Echocardiogram was done for her fatigue and shortness of breath. ejection fraction of 25-30% which was significant drop from prior ejection fraction February 2013 (45%) done at [facility]. normal RVSP.  Prior cardiac catheterization 08/30/2010 in [place] showed no significant coronary artery disease  PMH:   has a past medical history of Arthritis; Basal cell carcinoma (01/2014); Cardiac LV ejection fraction >40%; Chronic lower back pain; Colon polyps; Expressive aphasia; GERD (gastroesophageal reflux disease); Goiter (past remote); Hyperlipidemia; Hyperpotassemia; Hypertension; Hypertonicity of bladder; Inflammatory and toxic neuropathy, unspecified; LBBB (left bundle branch block); Migraine; Mini stroke ([place]) (01/2014); Other primary cardiomyopathies; Overactive bladder; Presence of combination internal cardiac defibrillator (ICD) and pacemaker; Reflex sympathetic dystrophy, unspecified; Shingles; Shortness of breath; Stroke ([place]); Thyroid disease; Type II diabetes mellitus ([place]); Ulcer; Urine incontinence; and Vertigo.  PSH:    Past Surgical History:  Procedure Laterality Date  . BACK SURGERY    . BI-VENTRICULAR PACEMAKER INSERTION (CRT-P)  10/2014   [facility]  .  BREAST CYST EXCISION Left 1959  . CARDIAC CATHETERIZATION  "several"    Morgan Hubbard  . CATARACT EXTRACTION W/ INTRAOCULAR LENS IMPLANT Left ~ 2005   several eye injections  . COLONOSCOPY  7/12   normal (hx of polyps in past)   . CT SCAN  3/12   outside hosp- lung nodule and gallstones  . [place]   "knot on index"  . KIDNEY DONATION Left 1989  . LUMBAR LAMINECTOMY/DECOMPRESSION MICRODISCECTOMY  1999  . LUMBAR LAMINECTOMY/DECOMPRESSION MICRODISCECTOMY  01/31/2012   Procedure: LUMBAR LAMINECTOMY/DECOMPRESSION MICRODISCECTOMY 2 LEVELS;  Surgeon: Eustace Moore, MD;  Location: [facility];  Service: Neurosurgery;  Laterality: N/A;  Thoracic twelve-lumbar one, lumbar one-two laminectomy   . POSTERIOR LAMINECTOMY / DECOMPRESSION CERVICAL SPINE  1999  . TUBAL LIGATION  1976  . UPPER GASTROINTESTINAL ENDOSCOPY      Current Outpatient Prescriptions  Medication Sig Dispense Refill  . aspirin 325 MG tablet Take 325 mg by mouth daily.    Marland Kitchen atorvastatin (LIPITOR) 80 MG tablet Take 80 mg by mouth daily.    . carvedilol (COREG) 12.5 MG tablet Take 1 tablet (12.5 mg total) by mouth 2 (two) times daily with a meal. 180 tablet 4  . Cholecalciferol (VITAMIN D-3) 1000 units CAPS Take 1 capsule by mouth daily.    . clotrimazole-betamethasone (LOTRISONE) cream Apply 1 application topically as needed.    . furosemide (LASIX) 20 MG tablet Take 1 tablet (20 mg total) by mouth daily as needed. 90 tablet 3  . gabapentin (NEURONTIN) 300 MG capsule TAKE ONE CAPSULE BY MOUTH THREE TIMES DAILY 270 capsule 1  . Glimepiride (AMARYL PO) Take 2 mg by mouth 2 (two) times daily.     . hydrALAZINE (APRESOLINE) 50 MG tablet Take 1 tablet by mouth 2 (two) times daily.    Marland Kitchen HYDROcodone-acetaminophen (NORCO/VICODIN) 5-325 MG tablet Take 1 tablet by mouth every 6 (six) hours as needed for severe pain. For pain 30 tablet 0  . insulin degludec (TRESIBA) 100 UNIT/ML SOPN FlexTouch Pen Inject 20 Units into the skin daily after breakfast.     . isosorbide mononitrate (IMDUR) 30 MG  24 hr tablet Take 1 tablet (30 mg total) by mouth daily. 90 tablet 4  . losartan-hydrochlorothiazide (HYZAAR) 50-12.5 MG per tablet Take one-half tab once daily in the mornings.    . metFORMIN (GLUCOPHAGE-XR) 500 MG 24 hr tablet Take 1,000 mg by mouth 2 (two) times daily.    Marland Kitchen MILK THISTLE PO Take 1 capsule by mouth 2 (two) times daily.     . mirabegron ER ([name]) 50 MG TB24 Take by mouth daily.    . nitroGLYCERIN (NITROSTAT) 0.4 MG SL tablet Place 0.4 mg under the tongue every 5 (five) minutes as needed for chest pain.    . pantoprazole (PROTONIX) 40 MG tablet Take 1 tablet (40 mg total) by mouth daily. 90 tablet 3  . vitamin B-12 (CYANOCOBALAMIN) 1000 MCG tablet Take 1,000 mcg by mouth daily.      No current facility-administered medications for this visit.      Allergies:   Ace inhibitors; Codeine; Insulin detemir; Lisinopril; Nsaids; and Tylenol [acetaminophen]   Social History:  The patient  reports that she has never smoked. She has never used smokeless tobacco. She reports that she does not drink alcohol or use drugs.   Family History:   family history includes Alcohol abuse in her father; Arthritis in her mother; Cancer in her brother, mother, and sister;  Diabetes in her brother, daughter, father, sister, and sister; Heart disease in her sister; Hyperlipidemia in her brother, mother, and sister; Hypertension in her mother and sister; Kidney disease in her brother; Kidney failure in her brother; Stroke in her mother.    Review of Systems: Review of Systems  Respiratory: Positive for shortness of breath.   Cardiovascular: Negative.   Gastrointestinal: Negative.   Musculoskeletal: Negative.        Gait instability  Neurological: Positive for weakness.  Psychiatric/Behavioral: Negative.   All other systems reviewed and are negative. no change in review of systems,getting weaker   PHYSICAL EXAM: VS:  BP 120/60 (BP Location: Left Arm, Patient Position: Sitting, Cuff Size:  Normal)   Pulse 71   Ht 5\' 1"  (1.549 m)   Wt 159 lb 8 oz (72.3 kg)   LMP 06/03/1992   BMI 30.14 kg/m  , BMI Body mass index is 30.14 kg/m.  GEN: Well nourished, well developed, in no acute distress , Obese HEENT: normal  Neck: no JVD, carotid bruits, or masses Cardiac: RRR; no murmurs, rubs, or gallops,no edema  Respiratory:  clear to auscultation bilaterally, normal work of breathing GI: soft, nontender, nondistended, + BS MS: no deformity or atrophy  Skin: warm and dry, no rash Neuro:  Strength and sensation are intact Psych: euthymic mood, full affect    Recent Labs: 04/22/2016: ALT 22; Hemoglobin 13.2; Magnesium 1.5; Platelets 181.0; TSH 0.82 05/08/2016: BUN 16; Creatinine, Ser 0.98; Potassium 5.2; Sodium 145    Lipid Panel Lab Results  Component Value Date   CHOL 146 04/22/2016   HDL 42.70 04/22/2016   LDLCALC 39 06/06/2014   TRIG 219.0 (H) 04/22/2016      Wt Readings from Last 3 Encounters:  02/27/17 159 lb 8 oz (72.3 kg)  09/03/16 158 lb 8 oz (71.9 kg)  04/24/16 153 lb 4 oz (69.5 kg)       ASSESSMENT AND PLAN:   HYPERCHOLESTEROLEMIA Cholesterol is at goal on the current lipid regimen. No changes to the medications were made.  Essential hypertension Blood pressure is well controlled on today's visit. No changes made to the medications.  Nonischemic cardiomyopathy ([place]) Prior ejection fraction 40% in July 2017 No changes made Notes from [place] reviewed Recommended she try to take Lasix for any ankle swelling, abdominal bloating She does not monitor her diet or her weight. Discussed with her  Cerebral thrombosis with cerebral infarction ([place]) No further episodes, on aspirin  Type 2 diabetes mellitus without complication, unspecified long term insulin use status ([place]) Still eats at restaurants on a frequent basis Numbers getting worse, weight trending upwards  Shortness of breath Secondary to deconditioning, weight gain Strongly recommended  regular exercise program, weight loss Discussed even re-doing pulmonary rehabilitation. Less inclined to do it given 50$ co-pay per visit  Disposition:   F/U  12 months  No orders of the defined types were placed in this encounter.   Total encounter time more than 25 minutes  Greater than 50% was spent in counseling and coordination of care with the patient   Signed, Esmond Plants, M.D., Ph.D. 02/27/2017  [place], [place]

## 2017-02-27 ENCOUNTER — Encounter: Payer: Self-pay | Admitting: Cardiovascular Disease

## 2017-02-27 ENCOUNTER — Ambulatory Visit (INDEPENDENT_AMBULATORY_CARE_PROVIDER_SITE_OTHER): Payer: Medicare HMO | Admitting: Cardiovascular Disease

## 2017-02-27 VITALS — BP 120/60 | HR 71 | Ht 61.0 in | Wt 159.5 lb

## 2017-02-27 DIAGNOSIS — I10 Essential (primary) hypertension: Secondary | ICD-10-CM | POA: Diagnosis not present

## 2017-02-27 DIAGNOSIS — E119 Type 2 diabetes mellitus without complications: Secondary | ICD-10-CM

## 2017-02-27 DIAGNOSIS — E7800 Pure hypercholesterolemia, unspecified: Secondary | ICD-10-CM | POA: Diagnosis not present

## 2017-02-27 DIAGNOSIS — Z794 Long term (current) use of insulin: Secondary | ICD-10-CM | POA: Diagnosis not present

## 2017-02-27 DIAGNOSIS — E1129 Type 2 diabetes mellitus with other diabetic kidney complication: Secondary | ICD-10-CM | POA: Diagnosis not present

## 2017-02-27 DIAGNOSIS — I5022 Chronic systolic (congestive) heart failure: Secondary | ICD-10-CM

## 2017-02-27 DIAGNOSIS — I428 Other cardiomyopathies: Secondary | ICD-10-CM

## 2017-02-27 DIAGNOSIS — R809 Proteinuria, unspecified: Secondary | ICD-10-CM | POA: Diagnosis not present

## 2017-02-27 DIAGNOSIS — E1165 Type 2 diabetes mellitus with hyperglycemia: Secondary | ICD-10-CM | POA: Diagnosis not present

## 2017-02-27 NOTE — Patient Instructions (Signed)

## 2017-03-07 DIAGNOSIS — R69 Illness, unspecified: Secondary | ICD-10-CM | POA: Diagnosis not present

## 2017-04-22 ENCOUNTER — Telehealth: Payer: Self-pay | Admitting: Family Medicine

## 2017-04-22 DIAGNOSIS — R790 Abnormal level of blood mineral: Secondary | ICD-10-CM

## 2017-04-22 DIAGNOSIS — E7800 Pure hypercholesterolemia, unspecified: Secondary | ICD-10-CM

## 2017-04-22 DIAGNOSIS — N289 Disorder of kidney and ureter, unspecified: Secondary | ICD-10-CM

## 2017-04-22 DIAGNOSIS — E049 Nontoxic goiter, unspecified: Secondary | ICD-10-CM

## 2017-04-22 DIAGNOSIS — I10 Essential (primary) hypertension: Secondary | ICD-10-CM

## 2017-04-22 NOTE — Telephone Encounter (Signed)
-----   Message from Inocencio Homes, [place] sent at 04/21/2017  1:45 PM EST ----- Regarding: CPE LABS  Patient is scheduled for labs on 05/02/17, can you please order labs. Thank you.

## 2017-05-01 DIAGNOSIS — Z95810 Presence of automatic (implantable) cardiac defibrillator: Secondary | ICD-10-CM | POA: Diagnosis not present

## 2017-05-01 DIAGNOSIS — I5022 Chronic systolic (congestive) heart failure: Secondary | ICD-10-CM | POA: Diagnosis not present

## 2017-05-01 DIAGNOSIS — Z4502 Encounter for adjustment and management of automatic implantable cardiac defibrillator: Secondary | ICD-10-CM | POA: Diagnosis not present

## 2017-05-02 ENCOUNTER — Other Ambulatory Visit (INDEPENDENT_AMBULATORY_CARE_PROVIDER_SITE_OTHER): Payer: Medicare HMO

## 2017-05-02 DIAGNOSIS — E7800 Pure hypercholesterolemia, unspecified: Secondary | ICD-10-CM | POA: Diagnosis not present

## 2017-05-02 DIAGNOSIS — I10 Essential (primary) hypertension: Secondary | ICD-10-CM | POA: Diagnosis not present

## 2017-05-02 DIAGNOSIS — E049 Nontoxic goiter, unspecified: Secondary | ICD-10-CM

## 2017-05-02 DIAGNOSIS — E119 Type 2 diabetes mellitus without complications: Secondary | ICD-10-CM | POA: Diagnosis not present

## 2017-05-02 DIAGNOSIS — R790 Abnormal level of blood mineral: Secondary | ICD-10-CM | POA: Diagnosis not present

## 2017-05-02 LAB — CBC WITH DIFFERENTIAL/PLATELET
Basophils Absolute: 0 10*3/uL (ref 0.0–0.1)
Basophils Relative: 0.4 % (ref 0.0–3.0)
EOS PCT: 5.6 % — AB (ref 0.0–5.0)
Eosinophils Absolute: 0.4 10*3/uL (ref 0.0–0.7)
HCT: 40.2 % (ref 36.0–46.0)
Hemoglobin: 13 g/dL (ref 12.0–15.0)
LYMPHS ABS: 1.7 10*3/uL (ref 0.7–4.0)
Lymphocytes Relative: 23.3 % (ref 12.0–46.0)
MCHC: 32.3 g/dL (ref 30.0–36.0)
MCV: 90.4 fl (ref 78.0–100.0)
Monocytes Absolute: 0.5 10*3/uL (ref 0.1–1.0)
Monocytes Relative: 7.1 % (ref 3.0–12.0)
NEUTROS ABS: 4.7 10*3/uL (ref 1.4–7.7)
Neutrophils Relative %: 63.6 % (ref 43.0–77.0)
PLATELETS: 238 10*3/uL (ref 150.0–400.0)
RBC: 4.44 Mil/uL (ref 3.87–5.11)
RDW: 12.9 % (ref 11.5–15.5)
WBC: 7.4 10*3/uL (ref 4.0–10.5)

## 2017-05-02 LAB — LIPID PANEL
CHOL/HDL RATIO: 4
Cholesterol: 122 mg/dL (ref 0–200)
HDL: 32.9 mg/dL — ABNORMAL LOW (ref >39.00)
LDL CALC: 57 mg/dL (ref 0–99)
NonHDL: 89.38
Triglycerides: 161 mg/dL — ABNORMAL HIGH (ref 0.0–149.0)
VLDL: 32.2 mg/dL (ref 0.0–40.0)

## 2017-05-02 LAB — COMPREHENSIVE METABOLIC PANEL
ALT: 21 U/L (ref 0–35)
AST: 23 U/L (ref 0–37)
Albumin: 3.6 g/dL (ref 3.5–5.2)
Alkaline Phosphatase: 73 U/L (ref 39–117)
BUN: 16 mg/dL (ref 6–23)
CHLORIDE: 108 meq/L (ref 96–112)
CO2: 31 meq/L (ref 19–32)
CREATININE: 0.97 mg/dL (ref 0.40–1.20)
Calcium: 9.5 mg/dL (ref 8.4–10.5)
GFR: 58.67 mL/min — AB (ref >60.00)
Glucose, Bld: 106 mg/dL — ABNORMAL HIGH (ref 70–99)
Potassium: 5 mEq/L (ref 3.5–5.1)
Sodium: 144 mEq/L (ref 135–145)
Total Bilirubin: 0.5 mg/dL (ref 0.2–1.2)
Total Protein: 6.3 g/dL (ref 6.0–8.3)

## 2017-05-02 LAB — TSH: TSH: 2.5 u[IU]/mL (ref 0.35–4.50)

## 2017-05-02 LAB — MAGNESIUM: Magnesium: 1.6 mg/dL (ref 1.5–2.5)

## 2017-05-02 LAB — HEMOGLOBIN A1C: Hgb A1c MFr Bld: 7.7 % — ABNORMAL HIGH (ref 4.6–6.5)

## 2017-05-06 ENCOUNTER — Encounter: Payer: Medicare HMO | Admitting: Family Medicine

## 2017-05-23 ENCOUNTER — Encounter: Payer: Self-pay | Admitting: Family Medicine

## 2017-05-23 ENCOUNTER — Ambulatory Visit (INDEPENDENT_AMBULATORY_CARE_PROVIDER_SITE_OTHER): Payer: Medicare HMO | Admitting: Family Medicine

## 2017-05-23 VITALS — BP 124/68 | HR 59 | Temp 98.0°F | Ht 60.75 in | Wt 159.8 lb

## 2017-05-23 DIAGNOSIS — N289 Disorder of kidney and ureter, unspecified: Secondary | ICD-10-CM | POA: Diagnosis not present

## 2017-05-23 DIAGNOSIS — I428 Other cardiomyopathies: Secondary | ICD-10-CM

## 2017-05-23 DIAGNOSIS — E119 Type 2 diabetes mellitus without complications: Secondary | ICD-10-CM | POA: Diagnosis not present

## 2017-05-23 DIAGNOSIS — E7800 Pure hypercholesterolemia, unspecified: Secondary | ICD-10-CM

## 2017-05-23 DIAGNOSIS — Z0000 Encounter for general adult medical examination without abnormal findings: Secondary | ICD-10-CM

## 2017-05-23 DIAGNOSIS — M549 Dorsalgia, unspecified: Secondary | ICD-10-CM

## 2017-05-23 DIAGNOSIS — I10 Essential (primary) hypertension: Secondary | ICD-10-CM | POA: Diagnosis not present

## 2017-05-23 DIAGNOSIS — G8929 Other chronic pain: Secondary | ICD-10-CM | POA: Diagnosis not present

## 2017-05-23 DIAGNOSIS — M8589 Other specified disorders of bone density and structure, multiple sites: Secondary | ICD-10-CM

## 2017-05-23 DIAGNOSIS — I639 Cerebral infarction, unspecified: Secondary | ICD-10-CM

## 2017-05-23 DIAGNOSIS — N950 Postmenopausal bleeding: Secondary | ICD-10-CM | POA: Diagnosis not present

## 2017-05-23 MED ORDER — GABAPENTIN 300 MG PO CAPS: 300.0000 mg | ORAL_CAPSULE | Freq: Three times a day (TID) | ORAL | 3 refills | Status: DC

## 2017-05-23 MED ORDER — TRAMADOL HCL 50 MG PO TABS: 50.0000 mg | ORAL_TABLET | Freq: Three times a day (TID) | ORAL | 0 refills | Status: DC | PRN

## 2017-05-23 MED ORDER — PANTOPRAZOLE SODIUM 40 MG PO TBEC: 40.0000 mg | DELAYED_RELEASE_TABLET | Freq: Every day | ORAL | 3 refills | Status: DC

## 2017-05-23 NOTE — Assessment & Plan Note (Signed)
Disc goals for lipids and reasons to control them Rev labs with pt Rev low sat fat diet in detail  LDL under 70 - goal  Continue atorvastatin 80 mg

## 2017-05-23 NOTE — Assessment & Plan Note (Signed)
dexa 1/18 On femara for breast cancer  No falls or fx On ca and D Will disc pros/cons tx with oncologist  Overall doing well  Re check at 2y

## 2017-05-23 NOTE — Progress Notes (Signed)
Subjective:    Patient ID: Morgan Hubbard, female    DOB: 04-Sep-1934, 82 y.o.   MRN: [id number]  HPI Here for health maintenance exam and to review chronic medical problems    Doing pretty well  C/o back pain - that is the worst issue/ she is  Needs refill of pain med  Takes about 30 vicodin per year  Willing to try tramadol instead   Wt Readings from Last 3 Encounters:  05/23/17 159 lb 12 oz (72.5 kg)  02/27/17 159 lb 8 oz (72.3 kg)  09/03/16 158 lb 8 oz (71.9 kg)  wt is stable  30.43 kg/m  Did not have amw visit   Tetanus shot overdue  Will check with pharmacy    Mammogram - goes to [facility] / last march had MRI instead of mammogram  Had mri in may -came out fine  Self breast exam- no lumps  Personal hx of breast cancer - still takes femara  Sister had breast cancer   Did have genetic w/u -did not come up with anything   Mother had uterine cancer Now has a grand daughter with melanoma    She saw gyn last year for vaginal bleeding - watching her   Flu shot 10/18  Eye exam 12/17- she had to cancel and will re schedule   dexa 1/18 osteopenia She is on femara  No falls (one trip - did not fall)  No injuries  No fractures    Colon screening - last colonoscopy 2012  Has the cologuard test at home   H/o cardiomyopathy- thinks her EF is low again - due for echo in January  Has a defib/pacer - that is doing well  Also small vessel cva in the past - no new symptoms   bp is stable today  No cp or palpitations or headaches or edema  No side effects to medicines  BP Readings from Last 3 Encounters:  05/23/17 124/68  02/27/17 120/60  09/03/16 (!) 158/68      Pulse Readings from Last 3 Encounters:  05/23/17 (!) 59  02/27/17 71  09/03/16 70    H/o renal insuff Lab Results  Component Value Date   CREATININE 0.97 05/02/2017   BUN 16 05/02/2017   NA 144 05/02/2017   K 5.0 05/02/2017   CL 108 05/02/2017   CO2 31 05/02/2017   Lab Results  Component Value  Date   ALT 21 05/02/2017   AST 23 05/02/2017   ALKPHOS 73 05/02/2017   BILITOT 0.5 05/02/2017   Lab Results  Component Value Date   WBC 7.4 05/02/2017   HGB 13.0 05/02/2017   HCT 40.2 05/02/2017   MCV 90.4 05/02/2017   PLT 238.0 05/02/2017   Lab Results  Component Value Date   TSH 2.50 05/02/2017    Sees Dr Gabriel Carina for DM2 Lab Results  Component Value Date   HGBA1C 7.7 (H) 05/02/2017  still doing fairly well (it dropped from the 8 range)  She is doing some better with diet (her husband does the cooking)   Cholesterol Lab Results  Component Value Date   CHOL 122 05/02/2017   CHOL 146 04/22/2016   CHOL 86 06/06/2014   Lab Results  Component Value Date   HDL 32.90 (L) 05/02/2017   HDL 42.70 04/22/2016   HDL 24.00 (L) 06/06/2014   Lab Results  Component Value Date   LDLCALC 57 05/02/2017   LDLCALC 39 06/06/2014   [place] 57 03/01/2014   Lab  Results  Component Value Date   TRIG 161.0 (H) 05/02/2017   TRIG 219.0 (H) 04/22/2016   TRIG 114.0 06/06/2014   Lab Results  Component Value Date   CHOLHDL 4 05/02/2017   CHOLHDL 3 04/22/2016   CHOLHDL 4 06/06/2014   Lab Results  Component Value Date   LDLDIRECT 77.0 04/22/2016   LDLDIRECT 151.3 03/02/2013   Atorvastatin 80 mg and diet   Patient Active Problem List   Diagnosis Date Noted  . Chronic back pain 05/23/2017  . Chronic systolic CHF (congestive heart failure) ([place]) 02/25/2017  . Osteopenia 06/16/2016  . Routine general medical examination at a health care facility 04/24/2016  . Estrogen deficiency 04/24/2016  . Urticaria 11/03/2015  . Type II diabetes mellitus ([place]) 06/21/2015  . Encounter for Medicare annual wellness exam 06/13/2014  . Cough 05/31/2014  . Stroke, small vessel ([place]) 01/18/2014  . Family history of hemochromatosis 01/18/2014  . Cerebral thrombosis with cerebral infarction ([place]) 01/14/2014  . Expressive aphasia 01/13/2014  . Nonischemic cardiomyopathy ([place]) 04/05/2013  . Shortness of  breath 09/03/2012  . Gallstones 05/26/2012  . Elevated transaminase level 05/18/2012  . Spinal stenosis of lumbar region 12/10/2011  . Mixed incontinence urge and stress 12/10/2011  . Renal insufficiency 11/04/2011  . Goiter 01/14/2011  . Fatty liver 08/28/2010  . DM (diabetes mellitus), type 2 ([place]) 07/09/2010  . HYPERCHOLESTEROLEMIA 07/09/2010  . HYPERKALEMIA 07/09/2010  . REFLEX SYMPATHETIC DYSTROPHY 07/09/2010  . POLYNEUROPATHY 07/09/2010  . Essential hypertension 07/09/2010  . CARDIOMYOPATHY 07/09/2010  . OVERACTIVE BLADDER 07/09/2010   Past Medical History:  Diagnosis Date  . Arthritis    "hands" (01/13/2014)  . Basal cell carcinoma 01/2014   "bridge of nose"  . Cardiac LV ejection fraction >40%    "it was 43 last year" (01/13/2014)  . Chronic lower back pain   . Colon polyps   . Expressive aphasia    "3 times in the last week" (01/13/2014)  . GERD (gastroesophageal reflux disease)   . Goiter past remote   treated with RI  . Hyperlipidemia   . Hyperpotassemia   . Hypertension   . Hypertonicity of bladder   . Inflammatory and toxic neuropathy, unspecified   . LBBB (left bundle branch block)   . Migraine    "stopped many years ago" (01/13/2014)  . Mini stroke ([place]) 01/2014  . Other primary cardiomyopathies   . Overactive bladder   . Presence of combination internal cardiac defibrillator (ICD) and pacemaker   . Reflex sympathetic dystrophy, unspecified   . Shingles   . Shortness of breath    ambulation  . Stroke ([place])   . Thyroid disease   . Type II diabetes mellitus ([place])   . Ulcer   . Urine incontinence   . Vertigo    hx of   Past Surgical History:  Procedure Laterality Date  . BACK SURGERY    . BI-VENTRICULAR PACEMAKER INSERTION (CRT-P)  10/2014   [facility]  . BREAST CYST EXCISION Left 1959  . CARDIAC CATHETERIZATION  "several"   [place]  . CATARACT EXTRACTION W/ INTRAOCULAR LENS IMPLANT Left ~ 2005   several eye injections  . COLONOSCOPY  7/12   normal  (hx of polyps in past)   . CT SCAN  3/12   outside hosp- lung nodule and gallstones  . [place]   "knot on index"  . KIDNEY DONATION Left 1989  . LUMBAR LAMINECTOMY/DECOMPRESSION MICRODISCECTOMY  1999  . LUMBAR LAMINECTOMY/DECOMPRESSION MICRODISCECTOMY  01/31/2012  Procedure: LUMBAR LAMINECTOMY/DECOMPRESSION MICRODISCECTOMY 2 LEVELS;  Surgeon: Eustace Moore, MD;  Location: [facility];  Service: Neurosurgery;  Laterality: N/A;  Thoracic twelve-lumbar one, lumbar one-two laminectomy   . POSTERIOR LAMINECTOMY / DECOMPRESSION CERVICAL SPINE  1999  . TUBAL LIGATION  1976  . UPPER GASTROINTESTINAL ENDOSCOPY     Social History   Tobacco Use  . Smoking status: Never Smoker  . Smokeless tobacco: Never Used  Substance Use Topics  . Alcohol use: No    Alcohol/week: 0.0 oz  . Drug use: No   Family History  Problem Relation Age of Onset  . Arthritis Mother   . Cancer Mother        uterine and mouth  . Hyperlipidemia Mother   . Stroke Mother   . Hypertension Mother   . Alcohol abuse Father   . Diabetes Father   . Cancer Sister        breast  . Diabetes Sister   . Cancer Brother        lung cancer  . Kidney disease Brother   . Diabetes Brother   . Hyperlipidemia Sister   . Heart disease Sister   . Hypertension Sister   . Diabetes Sister   . Hyperlipidemia Brother   . Kidney failure Brother   . Diabetes Daughter    Allergies  Allergen Reactions  . Ace Inhibitors Swelling    REACTION: tongue swelling  . Codeine Other (See Comments)    REACTION: nausea  . Insulin Detemir Itching  . Lisinopril Swelling    Swelling of tongue  . Nsaids   . Tylenol [Acetaminophen]    Current Outpatient Medications on File Prior to Visit  Medication Sig Dispense Refill  . aspirin 325 MG tablet Take 325 mg by mouth daily.    Marland Kitchen atorvastatin (LIPITOR) 80 MG tablet Take 80 mg by mouth daily.    . carvedilol (COREG) 12.5 MG tablet Take 1 tablet (12.5 mg total) by mouth 2  (two) times daily with a meal. 180 tablet 4  . Cholecalciferol (VITAMIN D-3) 1000 units CAPS Take 1 capsule by mouth daily.    . clotrimazole-betamethasone (LOTRISONE) cream Apply 1 application topically as needed.    . Glimepiride (AMARYL PO) Take 2 mg by mouth 2 (two) times daily.     . hydrALAZINE (APRESOLINE) 50 MG tablet Take 1 tablet by mouth 2 (two) times daily.    . insulin degludec (TRESIBA) 100 UNIT/ML SOPN FlexTouch Pen Inject 20 Units into the skin daily after breakfast.     . isosorbide mononitrate (IMDUR) 30 MG 24 hr tablet Take 1 tablet (30 mg total) by mouth daily. 90 tablet 4  . losartan-hydrochlorothiazide (HYZAAR) 50-12.5 MG per tablet Take one-half tab once daily in the mornings.    . metFORMIN (GLUCOPHAGE-XR) 500 MG 24 hr tablet Take 1,000 mg by mouth 2 (two) times daily.    Marland Kitchen MILK THISTLE PO Take 1 capsule by mouth 2 (two) times daily.     . mirabegron ER ([name]) 50 MG TB24 Take by mouth daily.    . nitroGLYCERIN (NITROSTAT) 0.4 MG SL tablet Place 0.4 mg under the tongue every 5 (five) minutes as needed for chest pain.    . vitamin B-12 (CYANOCOBALAMIN) 1000 MCG tablet Take 1,000 mcg by mouth daily.      No current facility-administered medications on file prior to visit.     Review of Systems  Constitutional: Positive for fatigue. Negative for activity change, appetite change, fever and unexpected  weight change.  HENT: Negative for congestion, ear pain, rhinorrhea, sinus pressure and sore throat.   Eyes: Negative for pain, redness and visual disturbance.  Respiratory: Positive for shortness of breath. Negative for cough and wheezing.   Cardiovascular: Negative for chest pain and palpitations.  Gastrointestinal: Negative for abdominal pain, blood in stool, constipation and diarrhea.  Endocrine: Negative for polydipsia and polyuria.  Genitourinary: Negative for dysuria, frequency and urgency.  Musculoskeletal: Positive for back pain. Negative for arthralgias and  myalgias.  Skin: Negative for pallor and rash.  Allergic/Immunologic: Negative for environmental allergies.  Neurological: Negative for dizziness, syncope and headaches.  Hematological: Negative for adenopathy. Does not bruise/bleed easily.  Psychiatric/Behavioral: Negative for decreased concentration and dysphoric mood. The patient is not nervous/anxious.        Objective:   Physical Exam  Constitutional: She appears well-developed and well-nourished. No distress.  obese and well appearing   HENT:  Head: Normocephalic and atraumatic.  Right Ear: External ear normal.  Left Ear: External ear normal.  Mouth/Throat: Oropharynx is clear and moist.  Eyes: Conjunctivae and EOM are normal. Pupils are equal, round, and reactive to light. No scleral icterus.  Neck: Normal range of motion. Neck supple. No JVD present. Carotid bruit is not present. No thyromegaly present.  Cardiovascular: Normal rate, regular rhythm and intact distal pulses. Exam reveals no gallop.  Murmur heard. Pulmonary/Chest: Effort normal and breath sounds normal. No respiratory distress. She has no wheezes. She exhibits no tenderness.  Few crackles at both bases  Good air exch   Abdominal: Soft. Bowel sounds are normal. She exhibits no distension, no abdominal bruit and no mass. There is no tenderness.  Genitourinary: No breast swelling, tenderness, discharge or bleeding.  Musculoskeletal: She exhibits no edema or tenderness.  Poor rom of neck and LS  Lymphadenopathy:    She has no cervical adenopathy.  Neurological: She is alert. She has normal reflexes. No cranial nerve deficit. She exhibits normal muscle tone. Coordination normal.  Skin: Skin is warm and dry. No rash noted. No erythema. No pallor.  Solar lentigines diffusely   Psychiatric: She has a normal mood and affect. Her mood appears not anxious. Cognition and memory are normal. She does not exhibit a depressed mood.  Pleasant and mentally sharp             Assessment & Plan:   Problem List Items Addressed This Visit      Cardiovascular and Mediastinum   Essential hypertension    bp in fair control at this time  BP Readings from Last 1 Encounters:  05/23/17 124/68   No changes needed Disc lifstyle change with low sodium diet and exercise  Labs rev  Wt loss enc      Nonischemic cardiomyopathy [facility])    Continue cardiology f/u  More sob lately      Stroke, small vessel ([place])    No further stroke symptoms        Endocrine   DM (diabetes mellitus), type 2 ([place])    Continues to see endocrinology Lab Results  Component Value Date   HGBA1C 7.7 (H) 05/02/2017           Musculoskeletal and Integument   Osteopenia    dexa 1/18 On femara for breast cancer  No falls or fx On ca and D Will disc pros/cons tx with oncologist  Overall doing well  Re check at 2y         Genitourinary   Renal insufficiency  Nl renal panel today  Enc good hydration         Other   Chronic back pain    From spinal stenosis  Uses occ vicodin  Will try tramadol instead  Update re: effectiveness Warned of sedation/falls and habit       Relevant Medications   traMADol (ULTRAM) 50 MG tablet   HYPERCHOLESTEROLEMIA    Disc goals for lipids and reasons to control them Rev labs with pt Rev low sat fat diet in detail  LDL under 70 - goal  Continue atorvastatin 80 mg       RESOLVED: Post-menopausal bleeding    Pt seeing gyn  obs  She is on femara-understands risks       Routine general medical examination at a health care facility - Primary    Reviewed health habits including diet and exercise and skin cancer prevention Reviewed appropriate screening tests for age  Also reviewed health mt list, fam hx and immunization status , as well as social and family history   See HPI Labs reviewed  Avs:   You are due for a tetanus shot - please check with a pharmacy about getting that   Don't forget to schedule your eye exam   Do your  cologuard test as planned

## 2017-05-23 NOTE — Assessment & Plan Note (Signed)
bp in fair control at this time  BP Readings from Last 1 Encounters:  05/23/17 124/68   No changes needed Disc lifstyle change with low sodium diet and exercise  Labs rev  Wt loss enc

## 2017-05-23 NOTE — Assessment & Plan Note (Signed)
Continues to see endocrinology Lab Results  Component Value Date   HGBA1C 7.7 (H) 05/02/2017

## 2017-05-23 NOTE — Assessment & Plan Note (Signed)
Nl renal panel today  Enc good hydration

## 2017-05-23 NOTE — Assessment & Plan Note (Signed)
From spinal stenosis  Uses occ vicodin  Will try tramadol instead  Update re: effectiveness Warned of sedation/falls and habit

## 2017-05-23 NOTE — Assessment & Plan Note (Signed)
No further stroke symptoms 

## 2017-05-23 NOTE — Assessment & Plan Note (Signed)
Reviewed health habits including diet and exercise and skin cancer prevention Reviewed appropriate screening tests for age  Also reviewed health mt list, fam hx and immunization status , as well as social and family history   See HPI Labs reviewed  Avs:   You are due for a tetanus shot - please check with a pharmacy about getting that   Don't forget to schedule your eye exam   Do your cologuard test as planned

## 2017-05-23 NOTE — Assessment & Plan Note (Signed)
Continue cardiology f/u  More sob lately

## 2017-05-23 NOTE — Patient Instructions (Addendum)
You are due for a tetanus shot - please check with a pharmacy about getting that   Don't forget to schedule your eye exam   Do your cologuard test as planned   Labs look fairly stable  Stay as active (safely) as you can be   Discuss your bone density test with your oncology in the future

## 2017-05-23 NOTE — Assessment & Plan Note (Signed)
Pt seeing gyn  obs  She is on femara-understands risks

## 2017-06-16 ENCOUNTER — Ambulatory Visit (INDEPENDENT_AMBULATORY_CARE_PROVIDER_SITE_OTHER)
Admission: RE | Admit: 2017-06-16 | Discharge: 2017-06-16 | Disposition: A | Discharge status: Home / Self Care | Payer: Medicare HMO | Source: Ambulatory Visit | Attending: Family Medicine | Admitting: Family Medicine

## 2017-06-16 ENCOUNTER — Other Ambulatory Visit: Payer: Self-pay

## 2017-06-16 ENCOUNTER — Ambulatory Visit (INDEPENDENT_AMBULATORY_CARE_PROVIDER_SITE_OTHER): Payer: Medicare HMO | Admitting: Family Medicine

## 2017-06-16 ENCOUNTER — Encounter: Payer: Self-pay | Admitting: Family Medicine

## 2017-06-16 VITALS — BP 140/72 | HR 78 | Temp 98.3°F | Ht 60.75 in | Wt 160.0 lb

## 2017-06-16 DIAGNOSIS — M25562 Pain in left knee: Secondary | ICD-10-CM

## 2017-06-16 DIAGNOSIS — M179 Osteoarthritis of knee, unspecified: Secondary | ICD-10-CM | POA: Diagnosis not present

## 2017-06-16 DIAGNOSIS — M2392 Unspecified internal derangement of left knee: Secondary | ICD-10-CM | POA: Diagnosis not present

## 2017-06-16 MED ORDER — METHYLPREDNISOLONE ACETATE 40 MG/ML IJ SUSP
80.0000 mg | Freq: Once | INTRAMUSCULAR | Status: AC
  Administered 2017-06-16: 80 mg via INTRA_ARTICULAR

## 2017-06-16 NOTE — Progress Notes (Signed)
Dr. Frederico Hamman T. Karey Stucki, MD, [facility] [place], [address] Phone: [phone] Fax: [phone]  06/16/2017  Patient: Morgan Hubbard, MRN: [id number], DOB: 1934/10/23, 83 y.o.  Primary Physician:  Tower, [name], MD   Chief Complaint  Patient presents with  . Knee Pain    Left   Subjective:   Morgan Hubbard is a 83 y.o. very pleasant female patient who presents with the following:  L knee - last week. Putting on linament all the time, walking stick. 18 years ago, knee was bothering him a lot and last June tripped in the parking lot.  She has not had any type of inciting injury recently.  But she does recognize that this is decidedly different over the last 1-2 weeks compared to prior to this.  She has not had any significant swelling or bruising.  She has not had any prior intervention or operative treatment of her knee lifetime.  No significant fracture.  L knee injection  Past Medical History, Surgical History, Social History, Family History, Problem List, Medications, and Allergies have been reviewed and updated if relevant.  Patient Active Problem List   Diagnosis Date Noted  . Chronic back pain 05/23/2017  . Chronic systolic CHF (congestive heart failure) ([place]) 02/25/2017  . Osteopenia 06/16/2016  . Routine general medical examination at a health care facility 04/24/2016  . Estrogen deficiency 04/24/2016  . Urticaria 11/03/2015  . Type II diabetes mellitus ([place]) 06/21/2015  . Encounter for Medicare annual wellness exam 06/13/2014  . Cough 05/31/2014  . Stroke, small vessel ([place]) 01/18/2014  . Family history of hemochromatosis 01/18/2014  . Cerebral thrombosis with cerebral infarction ([place]) 01/14/2014  . Expressive aphasia 01/13/2014  . Nonischemic cardiomyopathy ([place]) 04/05/2013  . Shortness of breath 09/03/2012  . Gallstones 05/26/2012  . Elevated transaminase level 05/18/2012  . Spinal stenosis of lumbar  region 12/10/2011  . Mixed incontinence urge and stress 12/10/2011  . Renal insufficiency 11/04/2011  . Goiter 01/14/2011  . Fatty liver 08/28/2010  . DM (diabetes mellitus), type 2 ([place]) 07/09/2010  . HYPERCHOLESTEROLEMIA 07/09/2010  . HYPERKALEMIA 07/09/2010  . REFLEX SYMPATHETIC DYSTROPHY 07/09/2010  . POLYNEUROPATHY 07/09/2010  . Essential hypertension 07/09/2010  . CARDIOMYOPATHY 07/09/2010  . OVERACTIVE BLADDER 07/09/2010    Past Medical History:  Diagnosis Date  . Arthritis    "hands" (01/13/2014)  . Basal cell carcinoma 01/2014   "bridge of nose"  . Cardiac LV ejection fraction >40%    "it was 43 last year" (01/13/2014)  . Chronic lower back pain   . Colon polyps   . Expressive aphasia    "3 times in the last week" (01/13/2014)  . GERD (gastroesophageal reflux disease)   . Goiter past remote   treated with RI  . Hyperlipidemia   . Hyperpotassemia   . Hypertension   . Hypertonicity of bladder   . Inflammatory and toxic neuropathy, unspecified   . LBBB (left bundle branch block)   . Migraine    "stopped many years ago" (01/13/2014)  . Mini stroke ([place]) 01/2014  . Other primary cardiomyopathies   . Overactive bladder   . Presence of combination internal cardiac defibrillator (ICD) and pacemaker   . Reflex sympathetic dystrophy, unspecified   . Shingles   . Shortness of breath    ambulation  . Stroke ([place])   . Thyroid disease   . Type II diabetes mellitus ([place])   . Ulcer   .  Urine incontinence   . Vertigo    hx of    Past Surgical History:  Procedure Laterality Date  . BACK SURGERY    . BI-VENTRICULAR PACEMAKER INSERTION (CRT-P)  10/2014   [facility]  . BREAST CYST EXCISION Left 1959  . CARDIAC CATHETERIZATION  "several"   [facility]  . CATARACT EXTRACTION W/ INTRAOCULAR LENS IMPLANT Left ~ 2005   several eye injections  . COLONOSCOPY  7/12   normal (hx of polyps in past)   . CT SCAN  3/12   outside hosp- lung nodule and gallstones  . [facility]   "knot on index"  . KIDNEY DONATION Left 1989  . LUMBAR LAMINECTOMY/DECOMPRESSION MICRODISCECTOMY  1999  . LUMBAR LAMINECTOMY/DECOMPRESSION MICRODISCECTOMY  01/31/2012   Procedure: LUMBAR LAMINECTOMY/DECOMPRESSION MICRODISCECTOMY 2 LEVELS;  Surgeon: Eustace Moore, MD;  Location: [facility];  Service: Neurosurgery;  Laterality: N/A;  Thoracic twelve-lumbar one, lumbar one-two laminectomy   . POSTERIOR LAMINECTOMY / DECOMPRESSION CERVICAL SPINE  1999  . TUBAL LIGATION  1976  . UPPER GASTROINTESTINAL ENDOSCOPY      Social History   Socioeconomic History  . Marital status: Married    Spouse name: Not on file  . Number of children: 2  . Years of education: Not on file  . Highest education level: Not on file  Social Needs  . Financial resource strain: Not on file  . Food insecurity - worry: Not on file  . Food insecurity - inability: Not on file  . Transportation needs - medical: Not on file  . Transportation needs - non-medical: Not on file  Occupational History  . Occupation: retired - Production manager   Tobacco Use  . Smoking status: Never Smoker  . Smokeless tobacco: Never Used  Substance and Sexual Activity  . Alcohol use: No    Alcohol/week: 0.0 oz  . Drug use: No  . Sexual activity: Yes  Other Topics Concern  . Not on file  Social History Narrative   Married 2nd time   Daughter is [name]     Family History  Problem Relation Age of Onset  . Arthritis Mother   . Cancer Mother        uterine and mouth  . Hyperlipidemia Mother   . Stroke Mother   . Hypertension Mother   . Alcohol abuse Father   . Diabetes Father   . Cancer Sister        breast  . Diabetes Sister   . Cancer Brother        lung cancer  . Kidney disease Brother   . Diabetes Brother   . Hyperlipidemia Sister   . Heart disease Sister   . Hypertension Sister   . Diabetes Sister   . Hyperlipidemia Brother   . Kidney failure Brother   . Diabetes Daughter     Allergies    Allergen Reactions  . Ace Inhibitors Swelling    REACTION: tongue swelling  . Codeine Other (See Comments)    REACTION: nausea  . Insulin Detemir Itching  . Lisinopril Swelling    Swelling of tongue  . Nsaids   . Tylenol [Acetaminophen]     Medication list reviewed and updated in full in [place].  GEN: No fevers, chills. Nontoxic. Primarily MSK c/o today. MSK: Detailed in the HPI GI: tolerating PO intake without difficulty Neuro: No numbness, parasthesias, or tingling associated. Otherwise the pertinent positives of the ROS are noted above.   Objective:  BP 140/72   Pulse 78   Temp 98.3 F (36.8 C) (Oral)   Ht 5' 0.75" (1.543 m)   Wt 160 lb (72.6 kg)   LMP 06/03/1992   BMI 30.48 kg/m    GEN: WDWN, NAD, Non-toxic, Alert & Oriented x 3 HEENT: Atraumatic, Normocephalic.  Ears and Nose: No external deformity. EXTR: No clubbing/cyanosis/edema NEURO: Normal gait.  PSYCH: Normally interactive. Conversant. Not depressed or anxious appearing.  Calm demeanor.   Knee:  L Gait: Normal heel toe pattern ROM: 0-115 Effusion: neg Echymosis or edema: none Patellar tendon NT Painful PLICA: neg Patellar grind: negative Medial and lateral patellar facet loading: negative medial and lateral joint lines: medial > lateral Mcmurray's pain Flexion-pinch pos Varus and valgus stress: stable Lachman: neg Ant and Post drawer: neg Hip abduction, IR, ER: WNL Hip flexion str: 5/5 Hip abd: 5/5 Quad: 5/5 VMO atrophy:No Hamstring concentric and eccentric: 5/5   Radiology: Dg Knee 4 Views W/patella Left  Result Date: 06/16/2017 CLINICAL DATA:  Left knee pain for 2 weeks EXAM: LEFT KNEE - COMPLETE 4+ VIEW COMPARISON:  None. FINDINGS: Early joint space narrowing and spurring. No acute bony abnormality. Specifically, no fracture, subluxation, or dislocation. No joint effusion IMPRESSION: Early osteoarthritic changes.  No acute bony abnormality. Electronically Signed   By: Rolm Baptise M.D.   On: 06/16/2017 11:26     Assessment and Plan:   Internal derangement of left knee  Left knee pain, unspecified chronicity - Plan: DG Knee 4 Views W/Patella Left, methylPREDNISolone acetate (DEPO-MEDROL) injection 80 mg  In the setting of mild arthritis, most likely degenerative meniscal tear, most likely medially.  I would continue with topical remedies, range of motion, knee bracing as well as using cane with walking as needed.  Additionally will do an intra-articular injection for symptom relief and pain control.  Knee Injection, L Patient verbally consented to procedure. Risks (including potential rare risk of infection), benefits, and alternatives explained. Sterilely prepped with Chloraprep. Ethyl cholride used for anesthesia. 8 cc Lidocaine 1% mixed with 2 mL Depo-Medrol 40 mg injected using the anteromedial approach without difficulty. No complications with procedure and tolerated well. Patient had decreased pain post-injection.   Follow-up: if sx persist  Meds ordered this encounter  Medications  . methylPREDNISolone acetate (DEPO-MEDROL) injection 80 mg   Medications Discontinued During This Encounter  Medication Reason  . HYDROcodone-acetaminophen (NORCO/VICODIN) 5-325 MG tablet Discontinued by provider   Orders Placed This Encounter  Procedures  . DG Knee 4 Views W/Patella Left    Signed,  Samyra Limb T. Daris Aristizabal, MD   Allergies as of 06/16/2017      Reactions   Ace Inhibitors Swelling   REACTION: tongue swelling   Codeine Other (See Comments)   REACTION: nausea   Insulin Detemir Itching   Lisinopril Swelling   Swelling of tongue   Nsaids    Tylenol [acetaminophen]       Medication List        Accurate as of 06/16/17  1:51 PM. Always use your most recent med list.          AMARYL PO Take 2 mg by mouth daily.   aspirin 325 MG tablet Take 325 mg by mouth daily.   atorvastatin 80 MG tablet Commonly known as:  LIPITOR Take 80 mg by mouth  daily.   carvedilol 12.5 MG tablet Commonly known as:  COREG Take 1 tablet (12.5 mg total) by mouth 2 (two) times daily with a meal.   clotrimazole-betamethasone  cream Commonly known as:  LOTRISONE Apply 1 application topically as needed.   FIFTY50 PEN NEEDLES 32G X 4 MM Misc Generic drug:  Insulin Pen Needle Use one daily with insulin pen.   gabapentin 300 MG capsule Commonly known as:  NEURONTIN Take 1 capsule (300 mg total) by mouth 3 (three) times daily.   hydrALAZINE 50 MG tablet Commonly known as:  APRESOLINE Take 1 tablet by mouth 2 (two) times daily.   insulin degludec 100 UNIT/ML Sopn FlexTouch Pen Commonly known as:  TRESIBA Inject 20 Units into the skin daily after breakfast.   isosorbide mononitrate 30 MG 24 hr tablet Commonly known as:  IMDUR Take 1 tablet (30 mg total) by mouth daily.   losartan-hydrochlorothiazide 50-12.5 MG tablet Commonly known as:  HYZAAR Take one-half tab once daily in the mornings.   metFORMIN 500 MG 24 hr tablet Commonly known as:  GLUCOPHAGE-XR Take 1,000 mg by mouth 2 (two) times daily.   MILK THISTLE PO Take 1 capsule by mouth 2 (two) times daily.   [name] 50 MG Tb24 tablet Generic drug:  mirabegron ER Take by mouth daily.   nitroGLYCERIN 0.4 MG SL tablet Commonly known as:  NITROSTAT Place 0.4 mg under the tongue every 5 (five) minutes as needed for chest pain.   ONE TOUCH ULTRA TEST test strip Generic drug:  glucose blood Use 3 (three) times daily.   pantoprazole 40 MG tablet Commonly known as:  PROTONIX Take 1 tablet (40 mg total) by mouth daily.   traMADol 50 MG tablet Commonly known as:  ULTRAM Take 1-2 tablets (50-100 mg total) by mouth every 8 (eight) hours as needed for moderate pain or severe pain.   vitamin B-12 1000 MCG tablet Commonly known as:  CYANOCOBALAMIN Take 1,000 mcg by mouth daily.   Vitamin D-3 1000 units Caps Take 1 capsule by mouth daily.

## 2017-06-25 DIAGNOSIS — E782 Mixed hyperlipidemia: Secondary | ICD-10-CM | POA: Diagnosis not present

## 2017-06-25 DIAGNOSIS — Z95810 Presence of automatic (implantable) cardiac defibrillator: Secondary | ICD-10-CM | POA: Diagnosis not present

## 2017-06-25 DIAGNOSIS — I428 Other cardiomyopathies: Secondary | ICD-10-CM | POA: Diagnosis not present

## 2017-06-25 DIAGNOSIS — I10 Essential (primary) hypertension: Secondary | ICD-10-CM | POA: Diagnosis not present

## 2017-06-27 DIAGNOSIS — R69 Illness, unspecified: Secondary | ICD-10-CM | POA: Diagnosis not present

## 2017-07-28 DIAGNOSIS — E1129 Type 2 diabetes mellitus with other diabetic kidney complication: Secondary | ICD-10-CM | POA: Diagnosis not present

## 2017-07-28 DIAGNOSIS — Z794 Long term (current) use of insulin: Secondary | ICD-10-CM | POA: Diagnosis not present

## 2017-07-28 DIAGNOSIS — E1165 Type 2 diabetes mellitus with hyperglycemia: Secondary | ICD-10-CM | POA: Diagnosis not present

## 2017-07-28 DIAGNOSIS — R809 Proteinuria, unspecified: Secondary | ICD-10-CM | POA: Diagnosis not present

## 2017-07-31 DIAGNOSIS — N3281 Overactive bladder: Secondary | ICD-10-CM | POA: Diagnosis not present

## 2017-07-31 DIAGNOSIS — N3020 Other chronic cystitis without hematuria: Secondary | ICD-10-CM | POA: Diagnosis not present

## 2017-08-04 DIAGNOSIS — E1129 Type 2 diabetes mellitus with other diabetic kidney complication: Secondary | ICD-10-CM | POA: Diagnosis not present

## 2017-08-04 DIAGNOSIS — I10 Essential (primary) hypertension: Secondary | ICD-10-CM | POA: Diagnosis not present

## 2017-08-04 DIAGNOSIS — E1165 Type 2 diabetes mellitus with hyperglycemia: Secondary | ICD-10-CM | POA: Diagnosis not present

## 2017-08-04 DIAGNOSIS — R809 Proteinuria, unspecified: Secondary | ICD-10-CM | POA: Diagnosis not present

## 2017-08-04 DIAGNOSIS — Z794 Long term (current) use of insulin: Secondary | ICD-10-CM | POA: Diagnosis not present

## 2017-08-06 DIAGNOSIS — R69 Illness, unspecified: Secondary | ICD-10-CM | POA: Diagnosis not present

## 2017-08-07 DIAGNOSIS — I5022 Chronic systolic (congestive) heart failure: Secondary | ICD-10-CM | POA: Diagnosis not present

## 2017-08-07 DIAGNOSIS — Z95810 Presence of automatic (implantable) cardiac defibrillator: Secondary | ICD-10-CM | POA: Diagnosis not present

## 2017-08-07 DIAGNOSIS — Z4502 Encounter for adjustment and management of automatic implantable cardiac defibrillator: Secondary | ICD-10-CM | POA: Diagnosis not present

## 2017-09-09 DIAGNOSIS — C50311 Malignant neoplasm of lower-inner quadrant of right female breast: Secondary | ICD-10-CM | POA: Diagnosis not present

## 2017-09-09 DIAGNOSIS — Z170 Estrogen receptor positive status [ER+]: Secondary | ICD-10-CM | POA: Diagnosis not present

## 2017-10-02 ENCOUNTER — Telehealth: Payer: Self-pay | Admitting: Family Medicine

## 2017-10-02 MED ORDER — FLUCONAZOLE 150 MG PO TABS: 150.0000 mg | ORAL_TABLET | Freq: Once | ORAL | 0 refills | Status: AC

## 2017-10-02 NOTE — Telephone Encounter (Signed)
Spoke to pt and advised OV is required. Offered 5/3 appt but pt states she "will be at [facility] all day." Pt scheduled for 5/6 but states she will mention it at her appt at [facility] and will cancel [facility] appt if she receives Tx there.

## 2017-10-02 NOTE — Telephone Encounter (Signed)
Copied from [place] [id number]. Topic: Quick Communication - See Telephone Encounter >> Oct 02, 2017 12:22 PM Rutherford Nail, NT wrote: CRM for notification. See Telephone encounter for: 10/02/17.  Patient calling to see if Dr [name] could call in an antibiotic for a yeast infection. States she knows that this is what is is and it is very uncomfortable. Please advise. CB#: [phone].

## 2017-10-02 NOTE — Telephone Encounter (Signed)
I sent diflucan to her [facility] F/u if not improved

## 2017-10-02 NOTE — Telephone Encounter (Signed)
Pt said she did try the monistat OTC and it didn't help. Pt said it burns to urinate but it's because she is very "raw" and sore in her vaginal area, also she has discharge that has the "cottage cheese look"

## 2017-10-02 NOTE — Telephone Encounter (Signed)
There are otc tx for yeast- like monistat - has she tried them ?   What symptoms is she having?

## 2017-10-03 DIAGNOSIS — I10 Essential (primary) hypertension: Secondary | ICD-10-CM | POA: Diagnosis not present

## 2017-10-03 DIAGNOSIS — Z853 Personal history of malignant neoplasm of breast: Secondary | ICD-10-CM | POA: Diagnosis not present

## 2017-10-03 DIAGNOSIS — I428 Other cardiomyopathies: Secondary | ICD-10-CM | POA: Diagnosis not present

## 2017-10-03 DIAGNOSIS — Z170 Estrogen receptor positive status [ER+]: Secondary | ICD-10-CM | POA: Diagnosis not present

## 2017-10-03 DIAGNOSIS — C50311 Malignant neoplasm of lower-inner quadrant of right female breast: Secondary | ICD-10-CM | POA: Diagnosis not present

## 2017-10-03 DIAGNOSIS — E782 Mixed hyperlipidemia: Secondary | ICD-10-CM | POA: Diagnosis not present

## 2017-10-03 NOTE — Telephone Encounter (Signed)
Pt's spouse notified Rx sent to pharmacy

## 2017-10-06 ENCOUNTER — Ambulatory Visit (INDEPENDENT_AMBULATORY_CARE_PROVIDER_SITE_OTHER): Payer: Medicare HMO | Admitting: Family Medicine

## 2017-10-06 ENCOUNTER — Encounter: Payer: Self-pay | Admitting: Family Medicine

## 2017-10-06 VITALS — BP 112/66 | HR 66 | Temp 98.1°F | Ht 60.75 in | Wt 153.8 lb

## 2017-10-06 DIAGNOSIS — I428 Other cardiomyopathies: Secondary | ICD-10-CM

## 2017-10-06 DIAGNOSIS — R202 Paresthesia of skin: Secondary | ICD-10-CM | POA: Insufficient documentation

## 2017-10-06 DIAGNOSIS — G629 Polyneuropathy, unspecified: Secondary | ICD-10-CM | POA: Diagnosis not present

## 2017-10-06 DIAGNOSIS — E1165 Type 2 diabetes mellitus with hyperglycemia: Secondary | ICD-10-CM | POA: Diagnosis not present

## 2017-10-06 DIAGNOSIS — L509 Urticaria, unspecified: Secondary | ICD-10-CM

## 2017-10-06 NOTE — Assessment & Plan Note (Signed)
Managed by endocrinology.

## 2017-10-06 NOTE — Patient Instructions (Signed)
Switch to zyrtec 10 mg once daily for your antihistamine (it may work better for hives) -instead of allegra   Use a cool compress on the itchy areas   Use scent free products  Take a break from perfume  Dove soap for sensitive skin  Use hair products with as little scent as possible  Lotion- lubriderm or cetaphil or anything without color or fragrance  For clothing- pick anything with the word "free"  No fabric softener at all (sheet or liquid)   See if this helps - then you can gradually re introduce one thing at a time  If no improvement- we will get you back to an allergies   Pollen may be acting as well   Keep an eye out for any stroke symptoms  Ask your neurologist when you go about the tingling  Scalp looks good

## 2017-10-06 NOTE — Progress Notes (Signed)
Subjective:    Patient ID: Morgan Hubbard, female    DOB: [birth year], 83 y.o.   MRN: [id number]  HPI Here for rash on abdomen  Very itchy (took a benadryl)  Whelp over R upper abdomen- comes up every so often   Has occ lip swelling-  ? What you are allergic to  Can happen any time No product or food changes  No medicine changes excep starting jardiance in the winter  Eating yogurt to prevent yeast infection   Saw allergist in [place] - did food allergy testing  Not environmental testing however -likely needs this    Also tingling in L side of scalp - top  She sent a note to her neurologist about it  No speech issues or facial droop    Wt Readings from Last 3 Encounters:  10/06/17 153 lb 12 oz (69.7 kg)  06/16/17 160 lb (72.6 kg)  05/23/17 159 lb 12 oz (72.5 kg)  29.29 kg/m   Had a yeast infection-better with diflucan /this resolved before appt arrived    BP Readings from Last 3 Encounters:  10/06/17 112/66  06/16/17 140/72  05/23/17 124/68   Hx of past CVA Also lumbar spinal stenosis  Takes asa 325 mgdaily  Also gabapentin  Also B12 oral    Patient Active Problem List   Diagnosis Date Noted  . Tingling 10/06/2017  . Chronic back pain 05/23/2017  . Chronic systolic CHF (congestive heart failure) ([place]) 02/25/2017  . Osteopenia 06/16/2016  . Routine general medical examination at a health care facility 04/24/2016  . Estrogen deficiency 04/24/2016  . Urticaria 11/03/2015  . Type II diabetes mellitus ([place]) 06/21/2015  . Encounter for Medicare annual wellness exam 06/13/2014  . Cough 05/31/2014  . Stroke, small vessel ([place]) 01/18/2014  . Family history of hemochromatosis 01/18/2014  . Cerebral thrombosis with cerebral infarction ([place]) 01/14/2014  . Expressive aphasia 01/13/2014  . Nonischemic cardiomyopathy ([place]) 04/05/2013  . Shortness of breath 09/03/2012  . Gallstones 05/26/2012  . Elevated transaminase level 05/18/2012  . Spinal stenosis of lumbar  region 12/10/2011  . Mixed incontinence urge and stress 12/10/2011  . Renal insufficiency 11/04/2011  . Goiter 01/14/2011  . Fatty liver 08/28/2010  . DM (diabetes mellitus), type 2 ([place]) 07/09/2010  . HYPERCHOLESTEROLEMIA 07/09/2010  . HYPERKALEMIA 07/09/2010  . REFLEX SYMPATHETIC DYSTROPHY 07/09/2010  . POLYNEUROPATHY 07/09/2010  . Essential hypertension 07/09/2010  . CARDIOMYOPATHY 07/09/2010  . OVERACTIVE BLADDER 07/09/2010   Past Medical History:  Diagnosis Date  . Arthritis    "hands" (01/13/2014)  . Basal cell carcinoma 01/2014   "bridge of nose"  . Cardiac LV ejection fraction >40%    "it was 43 last year" (01/13/2014)  . Chronic lower back pain   . Colon polyps   . Expressive aphasia    "3 times in the last week" (01/13/2014)  . GERD (gastroesophageal reflux disease)   . Goiter past remote   treated with RI  . Hyperlipidemia   . Hyperpotassemia   . Hypertension   . Hypertonicity of bladder   . Inflammatory and toxic neuropathy, unspecified   . LBBB (left bundle branch block)   . Migraine    "stopped many years ago" (01/13/2014)  . Mini stroke ([place]) 01/2014  . Other primary cardiomyopathies   . Overactive bladder   . Presence of combination internal cardiac defibrillator (ICD) and pacemaker   . Reflex sympathetic dystrophy, unspecified   . Shingles   . Shortness of breath  ambulation  . Stroke ([place])   . Thyroid disease   . Type II diabetes mellitus ([place])   . Ulcer   . Urine incontinence   . Vertigo    hx of   Past Surgical History:  Procedure Laterality Date  . BACK SURGERY    . BI-VENTRICULAR PACEMAKER INSERTION (CRT-P)  10/2014   [facility]  . BREAST CYST EXCISION Left 1959  . CARDIAC CATHETERIZATION  "several"   [place]  . CATARACT EXTRACTION W/ INTRAOCULAR LENS IMPLANT Left ~ 2005   several eye injections  . COLONOSCOPY  7/12   normal (hx of polyps in past)   . CT SCAN  3/12   outside hosp- lung nodule and gallstones  . [place]   "knot on index"  . KIDNEY DONATION Left 1989  . LUMBAR LAMINECTOMY/DECOMPRESSION MICRODISCECTOMY  1999  . LUMBAR LAMINECTOMY/DECOMPRESSION MICRODISCECTOMY  01/31/2012   Procedure: LUMBAR LAMINECTOMY/DECOMPRESSION MICRODISCECTOMY 2 LEVELS;  Surgeon: Eustace Moore, MD;  Location: [facility];  Service: Neurosurgery;  Laterality: N/A;  Thoracic twelve-lumbar one, lumbar one-two laminectomy   . POSTERIOR LAMINECTOMY / DECOMPRESSION CERVICAL SPINE  1999  . TUBAL LIGATION  1976  . UPPER GASTROINTESTINAL ENDOSCOPY     Social History   Tobacco Use  . Smoking status: Never Smoker  . Smokeless tobacco: Never Used  Substance Use Topics  . Alcohol use: No    Alcohol/week: 0.0 oz  . Drug use: No   Family History  Problem Relation Age of Onset  . Arthritis Mother   . Cancer Mother        uterine and mouth  . Hyperlipidemia Mother   . Stroke Mother   . Hypertension Mother   . Alcohol abuse Father   . Diabetes Father   . Cancer Sister        breast  . Diabetes Sister   . Cancer Brother        lung cancer  . Kidney disease Brother   . Diabetes Brother   . Hyperlipidemia Sister   . Heart disease Sister   . Hypertension Sister   . Diabetes Sister   . Hyperlipidemia Brother   . Kidney failure Brother   . Diabetes Daughter    Allergies  Allergen Reactions  . Ace Inhibitors Swelling    REACTION: tongue swelling  . Codeine Other (See Comments)    REACTION: nausea  . Hydrochlorothiazide   . Insulin Detemir Itching  . Lisinopril Swelling    Swelling of tongue  . Nsaids Other (See Comments)    Unable to take D/T kidney issues  . Tylenol [Acetaminophen]    Current Outpatient Medications on File Prior to Visit  Medication Sig Dispense Refill  . aspirin 325 MG tablet Take 325 mg by mouth daily.    Marland Kitchen atorvastatin (LIPITOR) 80 MG tablet Take 80 mg by mouth daily.    . carvedilol (COREG) 12.5 MG tablet Take 1 tablet (12.5 mg total) by mouth 2 (two) times daily with a meal. 180  tablet 4  . Cholecalciferol (VITAMIN D-3) 1000 units CAPS Take 1 capsule by mouth daily.    . clotrimazole-betamethasone (LOTRISONE) cream Apply 1 application topically as needed.    . empagliflozin (JARDIANCE) 25 MG TABS tablet Take 1 tablet by mouth daily.    Marland Kitchen gabapentin (NEURONTIN) 300 MG capsule Take 1 capsule (300 mg total) by mouth 3 (three) times daily. 270 capsule 3  . Glimepiride (AMARYL PO) Take 2 mg by mouth daily.     Marland Kitchen  glucose blood (ONE TOUCH ULTRA TEST) test strip Use 3 (three) times daily.    . hydrALAZINE (APRESOLINE) 50 MG tablet Take 1 tablet by mouth 2 (two) times daily.    . Insulin Pen Needle (FIFTY50 PEN NEEDLES) 32G X 4 MM MISC Use one daily with insulin pen.    . isosorbide mononitrate (IMDUR) 30 MG 24 hr tablet Take 1 tablet (30 mg total) by mouth daily. 90 tablet 4  . losartan-hydrochlorothiazide (HYZAAR) 50-12.5 MG per tablet Take one-half tab once daily in the mornings.    . metFORMIN (GLUCOPHAGE-XR) 500 MG 24 hr tablet Take 1,000 mg by mouth 2 (two) times daily.    Marland Kitchen MILK THISTLE PO Take 1 capsule by mouth 2 (two) times daily.     . mirabegron ER ([name]) 50 MG TB24 Take by mouth daily.    . nitroGLYCERIN (NITROSTAT) 0.4 MG SL tablet Place 0.4 mg under the tongue every 5 (five) minutes as needed for chest pain.    . pantoprazole (PROTONIX) 40 MG tablet Take 1 tablet (40 mg total) by mouth daily. 90 tablet 3  . traMADol (ULTRAM) 50 MG tablet Take 1-2 tablets (50-100 mg total) by mouth every 8 (eight) hours as needed for moderate pain or severe pain. 60 tablet 0  . vitamin B-12 (CYANOCOBALAMIN) 1000 MCG tablet Take 1,000 mcg by mouth daily.      No current facility-administered medications on file prior to visit.     Review of Systems  Constitutional: Negative for activity change, appetite change, fatigue, fever and unexpected weight change.  HENT: Negative for congestion, ear pain, rhinorrhea, sinus pressure and sore throat.   Eyes: Negative for pain, redness  and visual disturbance.  Respiratory: Negative for cough, shortness of breath and wheezing.   Cardiovascular: Negative for chest pain and palpitations.  Gastrointestinal: Negative for abdominal pain, blood in stool, constipation and diarrhea.  Endocrine: Negative for polydipsia and polyuria.  Genitourinary: Negative for dysuria, frequency and urgency.  Musculoskeletal: Negative for arthralgias, back pain and myalgias.  Skin: Positive for rash. Negative for pallor.  Allergic/Immunologic: Negative for environmental allergies.  Neurological: Negative for dizziness, syncope and headaches.  Hematological: Negative for adenopathy. Does not bruise/bleed easily.  Psychiatric/Behavioral: Negative for decreased concentration and dysphoric mood. The patient is not nervous/anxious.        Objective:   Physical Exam  Constitutional: She is oriented to person, place, and time. She appears well-developed and well-nourished. No distress.  obese and well appearing   HENT:  Head: Normocephalic and atraumatic.  Right Ear: External ear normal.  Left Ear: External ear normal.  Mouth/Throat: Oropharynx is clear and moist.  No skin changes on scalp  No tenderness No facial droop   Eyes: Pupils are equal, round, and reactive to light. Conjunctivae and EOM are normal. Right eye exhibits no discharge. Left eye exhibits no discharge. No scleral icterus.  Neck: Normal range of motion. Neck supple. No JVD present.  Cardiovascular: Normal rate, regular rhythm and normal heart sounds.  Pulmonary/Chest: Effort normal and breath sounds normal. No respiratory distress. She has no wheezes.  No crackles   Abdominal: Soft. Bowel sounds are normal. She exhibits no distension. There is no tenderness.  Musculoskeletal: She exhibits no edema.  Lymphadenopathy:    She has no cervical adenopathy.  Neurological: She is alert and oriented to person, place, and time. She displays normal reflexes. No cranial nerve deficit or  sensory deficit. She exhibits normal muscle tone. Coordination normal.  Skin: Skin is  warm and dry. Rash noted. She is not diaphoretic. There is erythema.  Areas of confluent urticaria/whelps around lower abd/waist and smaller spots on arms Pink in color  Few excoriations with dermographia   Psychiatric: She has a normal mood and affect.          Assessment & Plan:   Problem List Items Addressed This Visit      Cardiovascular and Mediastinum   Nonischemic cardiomyopathy ([place])    Clinically doing well         Endocrine   Poorly controlled type 2 diabetes mellitus ([place])    Managed by endocrinology      Relevant Medications   empagliflozin (JARDIANCE) 25 MG TABS tablet     Nervous and Auditory   Neuropathy    No clinical changes  Taking gabapentin  Unsure if related to her scalp tingling         Musculoskeletal and Integument   Urticaria - Primary    Intermittent over abd/waist and other warm body areas Also hx of intermittent lip swelling /angioedema  Disc avoidance of fragrances  Zyrtec 10 mg daily  Update if not starting to improve in a week or if worsening   Small threshold to return to allergist if needed   Unsure if related to scalp tingling-no skin change noted         Other   Tingling    No cause found ? If rel to urticaria or neuropathy  Watch

## 2017-10-06 NOTE — Assessment & Plan Note (Signed)
No cause found ? If rel to urticaria or neuropathy  Watch

## 2017-10-06 NOTE — Assessment & Plan Note (Signed)
Intermittent over abd/waist and other warm body areas Also hx of intermittent lip swelling /angioedema  Disc avoidance of fragrances  Zyrtec 10 mg daily  Update if not starting to improve in a week or if worsening   Small threshold to return to allergist if needed   Unsure if related to scalp tingling-no skin change noted

## 2017-10-06 NOTE — Assessment & Plan Note (Signed)
Clinically doing well.

## 2017-10-06 NOTE — Assessment & Plan Note (Signed)
No clinical changes  Taking gabapentin  Unsure if related to her scalp tingling

## 2017-10-07 ENCOUNTER — Ambulatory Visit: Payer: Medicare HMO | Admitting: Family Medicine

## 2017-10-10 DIAGNOSIS — L501 Idiopathic urticaria: Secondary | ICD-10-CM | POA: Diagnosis not present

## 2017-10-10 DIAGNOSIS — L821 Other seborrheic keratosis: Secondary | ICD-10-CM | POA: Diagnosis not present

## 2017-10-10 DIAGNOSIS — L989 Disorder of the skin and subcutaneous tissue, unspecified: Secondary | ICD-10-CM | POA: Diagnosis not present

## 2017-10-10 DIAGNOSIS — L304 Erythema intertrigo: Secondary | ICD-10-CM | POA: Diagnosis not present

## 2017-10-22 DIAGNOSIS — R69 Illness, unspecified: Secondary | ICD-10-CM | POA: Diagnosis not present

## 2017-10-23 DIAGNOSIS — L508 Other urticaria: Secondary | ICD-10-CM | POA: Diagnosis not present

## 2017-11-05 DIAGNOSIS — Z794 Long term (current) use of insulin: Secondary | ICD-10-CM | POA: Diagnosis not present

## 2017-11-05 DIAGNOSIS — E1129 Type 2 diabetes mellitus with other diabetic kidney complication: Secondary | ICD-10-CM | POA: Diagnosis not present

## 2017-11-05 DIAGNOSIS — E1165 Type 2 diabetes mellitus with hyperglycemia: Secondary | ICD-10-CM | POA: Diagnosis not present

## 2017-11-05 DIAGNOSIS — R809 Proteinuria, unspecified: Secondary | ICD-10-CM | POA: Diagnosis not present

## 2017-11-12 DIAGNOSIS — E1122 Type 2 diabetes mellitus with diabetic chronic kidney disease: Secondary | ICD-10-CM | POA: Diagnosis not present

## 2017-11-12 DIAGNOSIS — I10 Essential (primary) hypertension: Secondary | ICD-10-CM | POA: Diagnosis not present

## 2017-11-12 DIAGNOSIS — E1129 Type 2 diabetes mellitus with other diabetic kidney complication: Secondary | ICD-10-CM | POA: Diagnosis not present

## 2017-11-12 DIAGNOSIS — N183 Chronic kidney disease, stage 3 (moderate): Secondary | ICD-10-CM | POA: Diagnosis not present

## 2017-11-12 DIAGNOSIS — R809 Proteinuria, unspecified: Secondary | ICD-10-CM | POA: Diagnosis not present

## 2017-11-13 DIAGNOSIS — I5022 Chronic systolic (congestive) heart failure: Secondary | ICD-10-CM | POA: Diagnosis not present

## 2017-11-13 DIAGNOSIS — I472 Ventricular tachycardia: Secondary | ICD-10-CM | POA: Diagnosis not present

## 2017-11-13 DIAGNOSIS — Z95810 Presence of automatic (implantable) cardiac defibrillator: Secondary | ICD-10-CM | POA: Diagnosis not present

## 2017-11-13 DIAGNOSIS — Z4502 Encounter for adjustment and management of automatic implantable cardiac defibrillator: Secondary | ICD-10-CM | POA: Diagnosis not present

## 2017-12-16 DIAGNOSIS — R69 Illness, unspecified: Secondary | ICD-10-CM | POA: Diagnosis not present

## 2018-01-02 DIAGNOSIS — H348321 Tributary (branch) retinal vein occlusion, left eye, with retinal neovascularization: Secondary | ICD-10-CM | POA: Diagnosis not present

## 2018-01-02 DIAGNOSIS — E119 Type 2 diabetes mellitus without complications: Secondary | ICD-10-CM | POA: Diagnosis not present

## 2018-01-02 LAB — HM DIABETES EYE EXAM

## 2018-01-08 DIAGNOSIS — Z79899 Other long term (current) drug therapy: Secondary | ICD-10-CM | POA: Diagnosis not present

## 2018-01-08 DIAGNOSIS — L508 Other urticaria: Secondary | ICD-10-CM | POA: Diagnosis not present

## 2018-01-08 DIAGNOSIS — L509 Urticaria, unspecified: Secondary | ICD-10-CM | POA: Diagnosis not present

## 2018-01-15 ENCOUNTER — Encounter: Payer: Self-pay | Admitting: Family Medicine

## 2018-01-20 DIAGNOSIS — H34832 Tributary (branch) retinal vein occlusion, left eye, with macular edema: Secondary | ICD-10-CM | POA: Diagnosis not present

## 2018-01-23 DIAGNOSIS — R69 Illness, unspecified: Secondary | ICD-10-CM | POA: Diagnosis not present

## 2018-01-23 DIAGNOSIS — Z79899 Other long term (current) drug therapy: Secondary | ICD-10-CM | POA: Diagnosis not present

## 2018-01-23 DIAGNOSIS — I10 Essential (primary) hypertension: Secondary | ICD-10-CM | POA: Diagnosis not present

## 2018-01-23 DIAGNOSIS — I110 Hypertensive heart disease with heart failure: Secondary | ICD-10-CM | POA: Diagnosis not present

## 2018-01-23 DIAGNOSIS — R0602 Shortness of breath: Secondary | ICD-10-CM | POA: Diagnosis not present

## 2018-01-23 DIAGNOSIS — I472 Ventricular tachycardia: Secondary | ICD-10-CM | POA: Diagnosis not present

## 2018-01-23 DIAGNOSIS — I428 Other cardiomyopathies: Secondary | ICD-10-CM | POA: Diagnosis not present

## 2018-01-23 DIAGNOSIS — I5089 Other heart failure: Secondary | ICD-10-CM | POA: Diagnosis not present

## 2018-01-23 DIAGNOSIS — Z95810 Presence of automatic (implantable) cardiac defibrillator: Secondary | ICD-10-CM | POA: Diagnosis not present

## 2018-01-23 DIAGNOSIS — I498 Other specified cardiac arrhythmias: Secondary | ICD-10-CM | POA: Diagnosis not present

## 2018-01-23 DIAGNOSIS — Z4502 Encounter for adjustment and management of automatic implantable cardiac defibrillator: Secondary | ICD-10-CM | POA: Diagnosis not present

## 2018-02-05 DIAGNOSIS — E1129 Type 2 diabetes mellitus with other diabetic kidney complication: Secondary | ICD-10-CM | POA: Diagnosis not present

## 2018-02-05 DIAGNOSIS — R809 Proteinuria, unspecified: Secondary | ICD-10-CM | POA: Diagnosis not present

## 2018-02-05 DIAGNOSIS — N3020 Other chronic cystitis without hematuria: Secondary | ICD-10-CM | POA: Diagnosis not present

## 2018-02-05 DIAGNOSIS — N3281 Overactive bladder: Secondary | ICD-10-CM | POA: Diagnosis not present

## 2018-02-10 DIAGNOSIS — L508 Other urticaria: Secondary | ICD-10-CM | POA: Diagnosis not present

## 2018-02-10 DIAGNOSIS — Z79899 Other long term (current) drug therapy: Secondary | ICD-10-CM | POA: Diagnosis not present

## 2018-02-11 DIAGNOSIS — E875 Hyperkalemia: Secondary | ICD-10-CM | POA: Diagnosis not present

## 2018-02-11 DIAGNOSIS — R809 Proteinuria, unspecified: Secondary | ICD-10-CM | POA: Diagnosis not present

## 2018-02-11 DIAGNOSIS — E1122 Type 2 diabetes mellitus with diabetic chronic kidney disease: Secondary | ICD-10-CM | POA: Diagnosis not present

## 2018-02-11 DIAGNOSIS — N183 Chronic kidney disease, stage 3 (moderate): Secondary | ICD-10-CM | POA: Diagnosis not present

## 2018-02-11 DIAGNOSIS — E1129 Type 2 diabetes mellitus with other diabetic kidney complication: Secondary | ICD-10-CM | POA: Diagnosis not present

## 2018-02-15 NOTE — Progress Notes (Deleted)
Cardiology Office Note  Date:  02/15/2018   ID:  CLARABEL MARION, DOB 02-27-1935, MRN [id number]  PCP:  Abner Greenspan, MD   No chief complaint on file.   HPI:  Ms. Landgrebe is a very pleasant 83 year old woman with past medical history of  nonischemic cardiomyopathy, Biventricular ICD placed May 2016  Cath 2012 with no CAD ejection fraction of 82% in 9562 of uncertain etiology ,  left bundle branch block,  TIA/stroke August 2015. had difficulty speaking, Walking nephrectomy/donated kidney in 1989,  possible angioedema on lisinopril,  back surgery x3,  \minimal carotid b/l in 2015, carotid bruit on the right poorly controlled diabetes Hemoglobin A1c 9.4 to 7.9, She presents for routine followup Of her cardiomyopathy Ejection fraction of 15-20% in 2015, Up to 35% in August 2016, Up to 40% in 2017  She is followed by EP and her cardiology at [facility] to follow-up with our office approximately once a year She has endocrinology, primary care  No regular exercise, very deconditioned, eating more, weight up Does not like taking Lasix secondary to inconvenience of polyuria Gait instability, getting worse On her last office visit was waking up in the night short of breath me would relax it would slowly go away  Occasionally has ankle swelling  denies abdominal bloating, PND orthopnea  Recent device changes through EP at [facility], records reviewed with her No recent medication changes, tolerating her current medications  Lab work reviewed with her HBA1C 8.6, trending upwards  EKG Personally reviewed by myself on today's visit shows paced rhythm rate 71 bpm  Other past medical history reviewed TIA/stroke August 2015. had difficulty speaking, Walking Initially placed on aspirin, Plavix, been changed by neurology to aspirin 325 mg daily , Plavix held She continues to [facility] at [facility]  with cardiology and EP Biventricular ICD placed May 2016 for shortness of  breath symptoms placed by Dr. Beather Arbour  echocardiogram from August 2016 reviewed with her from [facility] showing ejection fraction 35% , no significant valve disease   recent echocardiogram performed at [facility] from 2017 showing ejection fraction 40%  Previous hemoglobin A1c 8.1,  managed by endocrinology Previously had low magnesium 1.3, was taking supplement, no longer taking supplement as this ran out. Total cholesterol general 2016 was 86 , takes Lipitor 80 mg daily  other past medical history History ofsevere back disease she has significant neuropathy in her legs.   Echocardiogram was done for her fatigue and shortness of breath. ejection fraction of 25-30% which was significant drop from prior ejection fraction February 2013 (45%) done at [facility] normal RVSP.  Prior cardiac catheterization 08/30/2010 in [place] showed no significant coronary artery disease  PMH:   has a past medical history of Arthritis, Basal cell carcinoma (01/2014), Cardiac LV ejection fraction >40%, Chronic lower back pain, Colon polyps, Expressive aphasia, GERD (gastroesophageal reflux disease), Goiter (past remote), Hyperlipidemia, Hyperpotassemia, Hypertension, Hypertonicity of bladder, Inflammatory and toxic neuropathy, unspecified, LBBB (left bundle branch block), Migraine, Mini stroke ([place]) (01/2014), Other primary cardiomyopathies, Overactive bladder, Presence of combination internal cardiac defibrillator (ICD) and pacemaker, Reflex sympathetic dystrophy, unspecified, Shingles, Shortness of breath, Stroke ([place]), Thyroid disease, Type II diabetes mellitus ([place]), Ulcer, Urine incontinence, and Vertigo.  PSH:    Past Surgical History:  Procedure Laterality Date  . BACK SURGERY    . BI-VENTRICULAR PACEMAKER INSERTION (CRT-P)  10/2014   [facility]  . BREAST CYST EXCISION Left 1959  . CARDIAC CATHETERIZATION  "several"   [place]  . CATARACT  EXTRACTION W/ INTRAOCULAR LENS IMPLANT Left ~ 2005    several eye injections  . COLONOSCOPY  7/12   normal (hx of polyps in past)   . CT SCAN  3/12   outside hosp- lung nodule and gallstones  . [place]   "knot on index"  . KIDNEY DONATION Left 1989  . LUMBAR LAMINECTOMY/DECOMPRESSION MICRODISCECTOMY  1999  . LUMBAR LAMINECTOMY/DECOMPRESSION MICRODISCECTOMY  01/31/2012   Procedure: LUMBAR LAMINECTOMY/DECOMPRESSION MICRODISCECTOMY 2 LEVELS;  Surgeon: Eustace Moore, MD;  Location: [place] [facility];  Service: Neurosurgery;  Laterality: N/A;  Thoracic twelve-lumbar one, lumbar one-two laminectomy   . POSTERIOR LAMINECTOMY / DECOMPRESSION CERVICAL SPINE  1999  . TUBAL LIGATION  1976  . UPPER GASTROINTESTINAL ENDOSCOPY      Current Outpatient Medications  Medication Sig Dispense Refill  . aspirin 325 MG tablet Take 325 mg by mouth daily.    Marland Kitchen atorvastatin (LIPITOR) 80 MG tablet Take 80 mg by mouth daily.    . carvedilol (COREG) 12.5 MG tablet Take 1 tablet (12.5 mg total) by mouth 2 (two) times daily with a meal. 180 tablet 4  . Cholecalciferol (VITAMIN D-3) 1000 units CAPS Take 1 capsule by mouth daily.    . clotrimazole-betamethasone (LOTRISONE) cream Apply 1 application topically as needed.    . empagliflozin (JARDIANCE) 25 MG TABS tablet Take 1 tablet by mouth daily.    Marland Kitchen gabapentin (NEURONTIN) 300 MG capsule Take 1 capsule (300 mg total) by mouth 3 (three) times daily. 270 capsule 3  . Glimepiride (AMARYL PO) Take 2 mg by mouth daily.     Marland Kitchen glucose blood (ONE TOUCH ULTRA TEST) test strip Use 3 (three) times daily.    . hydrALAZINE (APRESOLINE) 50 MG tablet Take 1 tablet by mouth 2 (two) times daily.    . Insulin Pen Needle (FIFTY50 PEN NEEDLES) 32G X 4 MM MISC Use one daily with insulin pen.    . isosorbide mononitrate (IMDUR) 30 MG 24 hr tablet Take 1 tablet (30 mg total) by mouth daily. 90 tablet 4  . losartan-hydrochlorothiazide (HYZAAR) 50-12.5 MG per tablet Take one-half tab once daily in the mornings.    .  metFORMIN (GLUCOPHAGE-XR) 500 MG 24 hr tablet Take 1,000 mg by mouth 2 (two) times daily.    Marland Kitchen MILK THISTLE PO Take 1 capsule by mouth 2 (two) times daily.     . mirabegron ER ([name]) 50 MG TB24 Take by mouth daily.    . nitroGLYCERIN (NITROSTAT) 0.4 MG SL tablet Place 0.4 mg under the tongue every 5 (five) minutes as needed for chest pain.    . pantoprazole (PROTONIX) 40 MG tablet Take 1 tablet (40 mg total) by mouth daily. 90 tablet 3  . traMADol (ULTRAM) 50 MG tablet Take 1-2 tablets (50-100 mg total) by mouth every 8 (eight) hours as needed for moderate pain or severe pain. 60 tablet 0  . vitamin B-12 (CYANOCOBALAMIN) 1000 MCG tablet Take 1,000 mcg by mouth daily.      No current facility-administered medications for this visit.      Allergies:   Ace inhibitors; Codeine; Hydrochlorothiazide; Insulin detemir; Lisinopril; Nsaids; and Tylenol [acetaminophen]   Social History:  The patient  reports that she has never smoked. She has never used smokeless tobacco. She reports that she does not drink alcohol or use drugs.   Family History:   family history includes Alcohol abuse in her father; Arthritis in her mother; Cancer in her brother, mother, and sister;  Diabetes in her brother, daughter, father, sister, and sister; Heart disease in her sister; Hyperlipidemia in her brother, mother, and sister; Hypertension in her mother and sister; Kidney disease in her brother; Kidney failure in her brother; Stroke in her mother.    Review of Systems: Review of Systems  Respiratory: Positive for shortness of breath.   Cardiovascular: Negative.   Gastrointestinal: Negative.   Musculoskeletal: Negative.        Gait instability  Neurological: Positive for weakness.  Psychiatric/Behavioral: Negative.   All other systems reviewed and are negative. no change in review of systems,getting weaker   PHYSICAL EXAM: VS:  LMP 06/03/1992  , BMI There is no height or weight on file to calculate  BMI.  GEN: Well nourished, well developed, in no acute distress , Obese HEENT: normal  Neck: no JVD, carotid bruits, or masses Cardiac: RRR; no murmurs, rubs, or gallops,no edema  Respiratory:  clear to auscultation bilaterally, normal work of breathing GI: soft, nontender, nondistended, + BS MS: no deformity or atrophy  Skin: warm and dry, no rash Neuro:  Strength and sensation are intact Psych: euthymic mood, full affect    Recent Labs: 05/02/2017: ALT 21; BUN 16; Creatinine, Ser 0.97; Hemoglobin 13.0; Magnesium 1.6; Platelets 238.0; Potassium 5.0; Sodium 144; TSH 2.50    Lipid Panel Lab Results  Component Value Date   CHOL 122 05/02/2017   HDL 32.90 (L) 05/02/2017   LDLCALC 57 05/02/2017   TRIG 161.0 (H) 05/02/2017      Wt Readings from Last 3 Encounters:  10/06/17 153 lb 12 oz (69.7 kg)  06/16/17 160 lb (72.6 kg)  05/23/17 159 lb 12 oz (72.5 kg)       ASSESSMENT AND PLAN:   HYPERCHOLESTEROLEMIA Cholesterol is at goal on the current lipid regimen. No changes to the medications were made.  Essential hypertension Blood pressure is well controlled on today's visit. No changes made to the medications.  Nonischemic cardiomyopathy ([place]) Prior ejection fraction 40% in July 2017 No changes made Notes from [place] reviewed Recommended she try to take Lasix for any ankle swelling, abdominal bloating She does not monitor her diet or her weight. Discussed with her  Cerebral thrombosis with cerebral infarction ([place]) No further episodes, on aspirin  Type 2 diabetes mellitus without complication, unspecified long term insulin use status ([place]) Still eats at restaurants on a frequent basis Numbers getting worse, weight trending upwards  Shortness of breath Secondary to deconditioning, weight gain Strongly recommended regular exercise program, weight loss Discussed even re-doing pulmonary rehabilitation. Less inclined to do it given 50$ co-pay per visit  Disposition:    F/U  12 months  No orders of the defined types were placed in this encounter.   Total encounter time more than 25 minutes  Greater than 50% was spent in counseling and coordination of care with the patient   Signed, Esmond Plants, M.D., Ph.D. 02/15/2018  [place], [place]

## 2018-02-16 ENCOUNTER — Ambulatory Visit: Payer: Medicare HMO | Admitting: Cardiovascular Disease

## 2018-02-16 NOTE — Progress Notes (Signed)
Cardiology Office Note  Date:  02/17/2018   ID:  Morgan Hubbard, DOB 04/29/1935, MRN [id number]  PCP:  Abner Greenspan, MD   Chief Complaint  Patient presents with  . other    12 mo follow up. SOB with exertion. Medications reviewed verbally.    HPI:  Morgan Hubbard is a very pleasant 83 year old woman with past medical history of  nonischemic cardiomyopathy,  Biventricular ICD placed May 2016  Cath 2012 with no CAD ejection fraction of 30% in 2009  left bundle branch block,  TIA/stroke August 2015. had difficulty speaking, Walking nephrectomy/donated kidney in 1989,  possible angioedema on lisinopril,  back surgery x3,  minimal carotid b/l in 2015, carotid bruit on the right poorly controlled diabetes Hemoglobin A1c 9.4 to 7.9, She presents for routine followup Of her cardiomyopathy  Ejection fraction of 15-20% in 2015,  Up to 35% in August 2016,  Up to 40% in 2017 Up to 55% in 06/2017  Echo 06/2017 reviewed with her in detail  She is eating better hemoglobin A1c down to 7.2 Slowly losing weight down 7 pounds Some gait instability Chronic shortness of breath which she attributed to deconditioning and chronic back pain Denies any leg swelling  Lab work reviewed with her stable renal function and hemoglobin A1c 7.2  EKG personally reviewed by myself on todays visit Shows paced rhythm 99 bpm  rare PVC  Other past medical history reviewed TIA/stroke August 2015. had difficulty speaking, Walking Initially placed on aspirin, Plavix, been changed by neurology to aspirin 325 mg daily , Plavix held She continues to follow-up at [facility]  with cardiology and EP Biventricular ICD placed May 2016 for shortness of breath symptoms placed by Dr. Beather Arbour  Total cholesterol 2016 was 86 , takes Lipitor 80 mg daily  other past medical history History ofsevere back disease she has significant neuropathy in her legs.   Echocardiogram was done for her fatigue and  shortness of breath. ejection fraction of 25-30% which was significant drop from prior ejection fraction February 2013 (45%) done at [facility]. normal RVSP.  Prior cardiac catheterization 08/30/2010 in [place] showed no significant coronary artery disease  PMH:   has a past medical history of Arthritis, Basal cell carcinoma (01/2014), Cardiac LV ejection fraction >40%, Chronic lower back pain, Colon polyps, Expressive aphasia, GERD (gastroesophageal reflux disease), Goiter (past remote), Hyperlipidemia, Hyperpotassemia, Hypertension, Hypertonicity of bladder, Inflammatory and toxic neuropathy, unspecified, LBBB (left bundle branch block), Migraine, Mini stroke ([place]) (01/2014), Other primary cardiomyopathies, Overactive bladder, Presence of combination internal cardiac defibrillator (ICD) and pacemaker, Reflex sympathetic dystrophy, unspecified, Shingles, Shortness of breath, Stroke ([place]), Thyroid disease, Type II diabetes mellitus ([place]), Ulcer, Urine incontinence, and Vertigo.  PSH:    Past Surgical History:  Procedure Laterality Date  . BACK SURGERY    . BI-VENTRICULAR PACEMAKER INSERTION (CRT-P)  10/2014   [facility]  . BREAST CYST EXCISION Left 1959  . CARDIAC CATHETERIZATION  "several"   [place]  . CATARACT EXTRACTION W/ INTRAOCULAR LENS IMPLANT Left ~ 2005   several eye injections  . COLONOSCOPY  7/12   normal (hx of polyps in past)   . CT SCAN  3/12   outside hosp- lung nodule and gallstones  . [place]   "knot on index"  . KIDNEY DONATION Left 1989  . LUMBAR LAMINECTOMY/DECOMPRESSION MICRODISCECTOMY  1999  . LUMBAR LAMINECTOMY/DECOMPRESSION MICRODISCECTOMY  01/31/2012   Procedure: LUMBAR LAMINECTOMY/DECOMPRESSION MICRODISCECTOMY 2 LEVELS;  Surgeon: Eustace Moore, MD;  Location: [place] [facility];  Service: Neurosurgery;  Laterality: N/A;  Thoracic twelve-lumbar one, lumbar one-two laminectomy   . POSTERIOR LAMINECTOMY / DECOMPRESSION CERVICAL SPINE  1999  .  TUBAL LIGATION  1976  . UPPER GASTROINTESTINAL ENDOSCOPY      Current Outpatient Medications  Medication Sig Dispense Refill  . aspirin 325 MG tablet Take 325 mg by mouth daily.    Marland Kitchen atorvastatin (LIPITOR) 80 MG tablet Take 80 mg by mouth daily.    . carvedilol (COREG) 12.5 MG tablet Take 1 tablet (12.5 mg total) by mouth 2 (two) times daily with a meal. 180 tablet 4  . Cholecalciferol (VITAMIN D-3) 1000 units CAPS Take 1 capsule by mouth daily.    . clotrimazole-betamethasone (LOTRISONE) cream Apply 1 application topically as needed.    . empagliflozin (JARDIANCE) 25 MG TABS tablet Take 1 tablet by mouth daily.    Marland Kitchen gabapentin (NEURONTIN) 300 MG capsule Take 1 capsule (300 mg total) by mouth 3 (three) times daily. 270 capsule 3  . Glimepiride (AMARYL PO) Take 2 mg by mouth daily.     Marland Kitchen glucose blood (ONE TOUCH ULTRA TEST) test strip Use 3 (three) times daily.    . hydrALAZINE (APRESOLINE) 50 MG tablet Take 1 tablet by mouth 2 (two) times daily.    . Insulin Pen Needle (FIFTY50 PEN NEEDLES) 32G X 4 MM MISC Use one daily with insulin pen.    . isosorbide mononitrate (IMDUR) 30 MG 24 hr tablet Take 1 tablet (30 mg total) by mouth daily. 90 tablet 4  . losartan-hydrochlorothiazide (HYZAAR) 50-12.5 MG per tablet Take one-half tab once daily in the mornings.    . metFORMIN (GLUCOPHAGE-XR) 500 MG 24 hr tablet Take 1,000 mg by mouth 2 (two) times daily.    Marland Kitchen MILK THISTLE PO Take 1 capsule by mouth 2 (two) times daily.     . nitroGLYCERIN (NITROSTAT) 0.4 MG SL tablet Place 0.4 mg under the tongue every 5 (five) minutes as needed for chest pain.    . pantoprazole (PROTONIX) 40 MG tablet Take 1 tablet (40 mg total) by mouth daily. 90 tablet 3  . traMADol (ULTRAM) 50 MG tablet Take 1-2 tablets (50-100 mg total) by mouth every 8 (eight) hours as needed for moderate pain or severe pain. 60 tablet 0  . vitamin B-12 (CYANOCOBALAMIN) 1000 MCG tablet Take 1,000 mcg by mouth daily.      No current  facility-administered medications for this visit.      Allergies:   Ace inhibitors; Codeine; Hydrochlorothiazide; Insulin detemir; Lisinopril; Nsaids; and Tylenol [acetaminophen]   Social History:  The patient  reports that she has never smoked. She has never used smokeless tobacco. She reports that she does not drink alcohol or use drugs.   Family History:   family history includes Alcohol abuse in her father; Arthritis in her mother; Cancer in her brother, mother, and sister; Diabetes in her brother, daughter, father, sister, and sister; Heart disease in her sister; Hyperlipidemia in her brother, mother, and sister; Hypertension in her mother and sister; Kidney disease in her brother; Kidney failure in her brother; Stroke in her mother.    Review of Systems: Review of Systems  Respiratory: Positive for shortness of breath.   Cardiovascular: Negative.   Gastrointestinal: Negative.   Musculoskeletal: Negative.        Gait instability  Neurological: Positive for weakness.  Psychiatric/Behavioral: Negative.   All other systems reviewed and are negative. no change in review of systems,getting weaker  PHYSICAL EXAM: VS:  BP 132/62 (BP Location: Left Arm, Patient Position: Sitting, Cuff Size: Normal)   Pulse 99   Ht 5\' 1"  (1.549 m)   Wt 152 lb 4 oz (69.1 kg)   LMP 06/03/1992   SpO2 93%   BMI 28.77 kg/m  , BMI Body mass index is 28.77 kg/m. Constitutional:  oriented to person, place, and time. No distress.  HENT:  Head: Normocephalic and atraumatic.  Eyes:  no discharge. No scleral icterus.  Neck: Normal range of motion. Neck supple. No JVD present.  Cardiovascular: Normal rate, regular rhythm, normal heart sounds and intact distal pulses. Exam reveals no gallop and no friction rub. No edema No murmur heard. Pulmonary/Chest: Effort normal and breath sounds normal. No stridor. No respiratory distress.  no wheezes.  no rales.  no tenderness.  Abdominal: Soft.  no distension.  no  tenderness.  Musculoskeletal: Normal range of motion.  no  tenderness or deformity.  Neurological:  normal muscle tone. Coordination normal. No atrophy Skin: Skin is warm and dry. No rash noted. not diaphoretic.  Psychiatric:  normal mood and affect. behavior is normal. Thought content normal.      Recent Labs: 05/02/2017: ALT 21; BUN 16; Creatinine, Ser 0.97; Hemoglobin 13.0; Magnesium 1.6; Platelets 238.0; Potassium 5.0; Sodium 144; TSH 2.50    Lipid Panel Lab Results  Component Value Date   CHOL 122 05/02/2017   HDL 32.90 (L) 05/02/2017   LDLCALC 57 05/02/2017   TRIG 161.0 (H) 05/02/2017      Wt Readings from Last 3 Encounters:  02/17/18 152 lb 4 oz (69.1 kg)  10/06/17 153 lb 12 oz (69.7 kg)  06/16/17 160 lb (72.6 kg)       ASSESSMENT AND PLAN:   HYPERCHOLESTEROLEMIA Cholesterol is at goal on the current lipid regimen. No changes to the medications were made.stable  Essential hypertension Blood pressure is well controlled on today's visit. No changes made to the medications. stable  Nonischemic cardiomyopathy ([place]) Recent echocardiogram from 2019 discussed with her showing normal ejection fraction She was unaware of the details EF estimated 55% or higher No medication changes made  Cerebral thrombosis with cerebral infarction ([place]) No further episodes, on aspirin  Type 2 diabetes mellitus without complication, unspecified long term insulin use status (HCC) Eating better, hemoglobin A1c trending downward 7.2 Weight loss  Shortness of breath Secondary to deconditioning,  Recommended regular walking program  Disposition:   F/U  12 months   Total encounter time more than 25 minutes  Greater than 50% was spent in counseling and coordination of care with the patient    Orders Placed This Encounter  Procedures  . EKG 12-Lead    Total encounter time more than 25 minutes  Greater than 50% was spent in counseling and coordination of care with the  patient   Signed, Esmond Plants, M.D., Ph.D. 02/17/2018  [place], [place]

## 2018-02-17 ENCOUNTER — Encounter: Payer: Self-pay | Admitting: Cardiovascular Disease

## 2018-02-17 ENCOUNTER — Ambulatory Visit: Payer: Medicare HMO | Admitting: Cardiovascular Disease

## 2018-02-17 VITALS — BP 132/62 | HR 99 | Ht 61.0 in | Wt 152.2 lb

## 2018-02-17 DIAGNOSIS — I428 Other cardiomyopathies: Secondary | ICD-10-CM | POA: Diagnosis not present

## 2018-02-17 DIAGNOSIS — R0602 Shortness of breath: Secondary | ICD-10-CM

## 2018-02-17 DIAGNOSIS — I6330 Cerebral infarction due to thrombosis of unspecified cerebral artery: Secondary | ICD-10-CM

## 2018-02-17 DIAGNOSIS — E7800 Pure hypercholesterolemia, unspecified: Secondary | ICD-10-CM | POA: Diagnosis not present

## 2018-02-17 DIAGNOSIS — E119 Type 2 diabetes mellitus without complications: Secondary | ICD-10-CM | POA: Diagnosis not present

## 2018-02-17 DIAGNOSIS — I5022 Chronic systolic (congestive) heart failure: Secondary | ICD-10-CM | POA: Diagnosis not present

## 2018-02-17 DIAGNOSIS — I10 Essential (primary) hypertension: Secondary | ICD-10-CM | POA: Diagnosis not present

## 2018-02-17 NOTE — Patient Instructions (Signed)

## 2018-03-09 ENCOUNTER — Ambulatory Visit (INDEPENDENT_AMBULATORY_CARE_PROVIDER_SITE_OTHER): Payer: Medicare HMO | Admitting: Family Medicine

## 2018-03-09 ENCOUNTER — Encounter: Payer: Self-pay | Admitting: Family Medicine

## 2018-03-09 VITALS — BP 136/62 | HR 82 | Temp 98.2°F | Ht 61.0 in | Wt 151.2 lb

## 2018-03-09 DIAGNOSIS — Z23 Encounter for immunization: Secondary | ICD-10-CM | POA: Diagnosis not present

## 2018-03-09 DIAGNOSIS — H6121 Impacted cerumen, right ear: Secondary | ICD-10-CM | POA: Diagnosis not present

## 2018-03-09 MED ORDER — TRAMADOL HCL 50 MG PO TABS: 50.0000 mg | ORAL_TABLET | Freq: Three times a day (TID) | ORAL | 0 refills | Status: DC | PRN

## 2018-03-09 NOTE — Progress Notes (Signed)
Subjective:    Patient ID: Morgan Hubbard, female    DOB: [birth year], 83 y.o.   MRN: [id number]  HPI  Here with c/o of ear fullness  ? If she needs irrigation  She is using some wax dissolving drops  No pain - just fullness and difficulty hearing  R ear worse than the L No drainage   Also some nasal symptoms -occ sneezing fits  Not a lot of congestion   Needs refill for tramadol  Chronic pain- uses occasionally  Saw dermatology Diagnosed with neutrophilic urticaria (hives)- husband also interestingly  Sees Dr Kellie Moor  Dapsone Zyrtec   Some stress Chronically ill niece with addison's dz had cardiac arrest and it hospitalized   Patient Active Problem List   Diagnosis Date Noted  . Tingling 10/06/2017  . Chronic back pain 05/23/2017  . Chronic systolic CHF (congestive heart failure) ([place]) 02/25/2017  . Osteopenia 06/16/2016  . Routine general medical examination at a health care facility 04/24/2016  . Estrogen deficiency 04/24/2016  . Urticaria 11/03/2015  . Poorly controlled type 2 diabetes mellitus ([place]) 06/21/2015  . Cerumen impaction 03/10/2015  . Encounter for Medicare annual wellness exam 06/13/2014  . Stroke, small vessel ([place]) 01/18/2014  . Family history of hemochromatosis 01/18/2014  . Cerebral thrombosis with cerebral infarction ([place]) 01/14/2014  . Expressive aphasia 01/13/2014  . Nonischemic cardiomyopathy ([place]) 04/05/2013  . Shortness of breath 09/03/2012  . Gallstones 05/26/2012  . Elevated transaminase level 05/18/2012  . Spinal stenosis of lumbar region 12/10/2011  . Mixed incontinence urge and stress 12/10/2011  . Renal insufficiency 11/04/2011  . Goiter 01/14/2011  . Fatty liver 08/28/2010  . DM (diabetes mellitus), type 2 ([place]) 07/09/2010  . HYPERCHOLESTEROLEMIA 07/09/2010  . HYPERKALEMIA 07/09/2010  . REFLEX SYMPATHETIC DYSTROPHY 07/09/2010  . Neuropathy 07/09/2010  . Essential hypertension 07/09/2010  . CARDIOMYOPATHY 07/09/2010  .  OVERACTIVE BLADDER 07/09/2010   Past Medical History:  Diagnosis Date  . Arthritis    "hands" (01/13/2014)  . Basal cell carcinoma 01/2014   "bridge of nose"  . Cardiac LV ejection fraction >40%    "it was 43 last year" (01/13/2014)  . Chronic lower back pain   . Colon polyps   . Expressive aphasia    "3 times in the last week" (01/13/2014)  . GERD (gastroesophageal reflux disease)   . Goiter past remote   treated with RI  . Hyperlipidemia   . Hyperpotassemia   . Hypertension   . Hypertonicity of bladder   . Inflammatory and toxic neuropathy, unspecified   . LBBB (left bundle branch block)   . Migraine    "stopped many years ago" (01/13/2014)  . Mini stroke ([place]) 01/2014  . Other primary cardiomyopathies   . Overactive bladder   . Presence of combination internal cardiac defibrillator (ICD) and pacemaker   . Reflex sympathetic dystrophy, unspecified   . Shingles   . Shortness of breath    ambulation  . Stroke ([place])   . Thyroid disease   . Type II diabetes mellitus ([place])   . Ulcer   . Urine incontinence   . Vertigo    hx of   Past Surgical History:  Procedure Laterality Date  . BACK SURGERY    . BI-VENTRICULAR PACEMAKER INSERTION (CRT-P)  10/2014   [facility]  . BREAST CYST EXCISION Left 1959  . CARDIAC CATHETERIZATION  "several"   [place]  . CATARACT EXTRACTION W/ INTRAOCULAR LENS IMPLANT Left ~ 2005   several eye injections  .  COLONOSCOPY  7/12   normal (hx of polyps in past)   . CT SCAN  3/12   outside hosp- lung nodule and gallstones  . [place]   "knot on index"  . KIDNEY DONATION Left 1989  . LUMBAR LAMINECTOMY/DECOMPRESSION MICRODISCECTOMY  1999  . LUMBAR LAMINECTOMY/DECOMPRESSION MICRODISCECTOMY  01/31/2012   Procedure: LUMBAR LAMINECTOMY/DECOMPRESSION MICRODISCECTOMY 2 LEVELS;  Surgeon: Eustace Moore, MD;  Location: [place] [facility];  Service: Neurosurgery;  Laterality: N/A;  Thoracic twelve-lumbar one, lumbar one-two laminectomy   .  POSTERIOR LAMINECTOMY / DECOMPRESSION CERVICAL SPINE  1999  . TUBAL LIGATION  1976  . UPPER GASTROINTESTINAL ENDOSCOPY     Social History   Tobacco Use  . Smoking status: Never Smoker  . Smokeless tobacco: Never Used  Substance Use Topics  . Alcohol use: No    Alcohol/week: 0.0 standard drinks  . Drug use: No   Family History  Problem Relation Age of Onset  . Arthritis Mother   . Cancer Mother        uterine and mouth  . Hyperlipidemia Mother   . Stroke Mother   . Hypertension Mother   . Alcohol abuse Father   . Diabetes Father   . Cancer Sister        breast  . Diabetes Sister   . Cancer Brother        lung cancer  . Kidney disease Brother   . Diabetes Brother   . Hyperlipidemia Sister   . Heart disease Sister   . Hypertension Sister   . Diabetes Sister   . Hyperlipidemia Brother   . Kidney failure Brother   . Diabetes Daughter   . Addison's disease Other    Allergies  Allergen Reactions  . Ace Inhibitors Swelling    REACTION: tongue swelling  . Codeine Other (See Comments)    REACTION: nausea  . Hydrochlorothiazide   . Insulin Detemir Itching  . Lisinopril Swelling    Swelling of tongue  . Nsaids Other (See Comments)    Unable to take D/T kidney issues  . Tylenol [Acetaminophen]    Current Outpatient Medications on File Prior to Visit  Medication Sig Dispense Refill  . aspirin 325 MG tablet Take 325 mg by mouth daily.    Marland Kitchen atorvastatin (LIPITOR) 80 MG tablet Take 80 mg by mouth daily.    . carvedilol (COREG) 12.5 MG tablet Take 1 tablet (12.5 mg total) by mouth 2 (two) times daily with a meal. 180 tablet 4  . cetirizine (ZYRTEC) 10 MG tablet Take 10 mg by mouth 2 (two) times daily.    . Cholecalciferol (VITAMIN D-3) 1000 units CAPS Take 1 capsule by mouth daily.    . clotrimazole-betamethasone (LOTRISONE) cream Apply 1 application topically as needed.    . dapsone 25 MG tablet Take 50 mg by mouth daily.    Marland Kitchen gabapentin (NEURONTIN) 300 MG capsule Take 1  capsule (300 mg total) by mouth 3 (three) times daily. 270 capsule 3  . Glimepiride (AMARYL PO) Take 2 mg by mouth daily.     Marland Kitchen glucose blood (ONE TOUCH ULTRA TEST) test strip Use 3 (three) times daily.    . hydrALAZINE (APRESOLINE) 50 MG tablet Take 1 tablet by mouth 2 (two) times daily.    . hydrochlorothiazide (MICROZIDE) 12.5 MG capsule Take 12.5 mg by mouth daily.    . isosorbide mononitrate (IMDUR) 30 MG 24 hr tablet Take 1 tablet (30 mg total) by mouth daily. [place]  tablet 4  . letrozole (FEMARA) 2.5 MG tablet Take 2.5 mg by mouth daily.    Marland Kitchen losartan (COZAAR) 50 MG tablet Take 50 mg by mouth daily.    . metFORMIN (GLUCOPHAGE-XR) 500 MG 24 hr tablet Take 1,000 mg by mouth 2 (two) times daily.    Marland Kitchen MILK THISTLE PO Take 1 capsule by mouth 2 (two) times daily.     . nitroGLYCERIN (NITROSTAT) 0.4 MG SL tablet Place 0.4 mg under the tongue every 5 (five) minutes as needed for chest pain.    . pantoprazole (PROTONIX) 40 MG tablet Take 1 tablet (40 mg total) by mouth daily. 90 tablet 3  . vitamin B-12 (CYANOCOBALAMIN) 1000 MCG tablet Take 1,000 mcg by mouth daily.      No current facility-administered medications on file prior to visit.     Review of Systems  Constitutional: Negative for fatigue and fever.  HENT: Positive for hearing loss, rhinorrhea and sneezing. Negative for congestion, ear discharge, ear pain and voice change.   Eyes: Negative for itching.  Respiratory: Negative for cough and shortness of breath.   Cardiovascular: Negative for chest pain.  Gastrointestinal: Negative for nausea.  Musculoskeletal: Positive for arthralgias.  Skin: Positive for rash.       Intermittent urticaria   Neurological: Negative for dizziness.  Psychiatric/Behavioral: Negative for dysphoric mood. The patient is not nervous/anxious.        Some worry about family        Objective:   Physical Exam  Constitutional: She appears well-developed and well-nourished. No distress.  obese and well  appearing   HENT:  Head: Normocephalic and atraumatic.  Left Ear: External ear normal.  Nose: Nose normal.  Mouth/Throat: Oropharynx is clear and moist.  R ear canal- total cerumen impaction  Cleared by simple irrigation- clear canal w/o redness or swelling and nl TM  L ear- clear canal and nl TM  Boggy nares   Eyes: Pupils are equal, round, and reactive to light. Conjunctivae and EOM are normal. Right eye exhibits no discharge. Left eye exhibits no discharge. No scleral icterus.  Neck: Normal range of motion. Neck supple. Carotid bruit is present.  Carotid bruit R radiating to chest   Cardiovascular: Normal rate, regular rhythm and normal heart sounds.  Lymphadenopathy:    She has no cervical adenopathy.  Neurological: She is alert. No cranial nerve deficit. Coordination normal.  Skin: Skin is warm and dry. No erythema.  No urticaria today  Psychiatric: She has a normal mood and affect.          Assessment & Plan:   Problem List Items Addressed This Visit      Nervous and Auditory   Cerumen impaction - Primary    Total cerumen impaction in R ear cleared with simple irrigation today  Pt tolerated it well  Hearing improved  inst to keep using wax dissolving drops otc on a schedule  Update if no further improvement

## 2018-03-09 NOTE — Patient Instructions (Signed)
Keep using wax drops as needed  It flushed well - and is clear now If no improvement in hearing - let us know   Take care of yourself

## 2018-03-09 NOTE — Assessment & Plan Note (Signed)
Total cerumen impaction in R ear cleared with simple irrigation today  Pt tolerated it well  Hearing improved  inst to keep using wax dissolving drops otc on a schedule  Update if no further improvement

## 2018-03-18 DIAGNOSIS — Z79899 Other long term (current) drug therapy: Secondary | ICD-10-CM | POA: Diagnosis not present

## 2018-03-18 DIAGNOSIS — L508 Other urticaria: Secondary | ICD-10-CM | POA: Diagnosis not present

## 2018-04-01 DIAGNOSIS — R69 Illness, unspecified: Secondary | ICD-10-CM | POA: Diagnosis not present

## 2018-04-24 DIAGNOSIS — Z95810 Presence of automatic (implantable) cardiac defibrillator: Secondary | ICD-10-CM | POA: Diagnosis not present

## 2018-04-24 DIAGNOSIS — Z4502 Encounter for adjustment and management of automatic implantable cardiac defibrillator: Secondary | ICD-10-CM | POA: Diagnosis not present

## 2018-04-29 DIAGNOSIS — L509 Urticaria, unspecified: Secondary | ICD-10-CM | POA: Diagnosis not present

## 2018-04-29 DIAGNOSIS — Z5181 Encounter for therapeutic drug level monitoring: Secondary | ICD-10-CM | POA: Diagnosis not present

## 2018-05-13 ENCOUNTER — Ambulatory Visit: Payer: Medicare HMO | Admitting: Family Medicine

## 2018-05-13 ENCOUNTER — Encounter: Payer: Self-pay | Admitting: Family Medicine

## 2018-05-13 ENCOUNTER — Ambulatory Visit (INDEPENDENT_AMBULATORY_CARE_PROVIDER_SITE_OTHER): Payer: Medicare HMO | Admitting: Family Medicine

## 2018-05-13 ENCOUNTER — Ambulatory Visit (INDEPENDENT_AMBULATORY_CARE_PROVIDER_SITE_OTHER)
Admission: RE | Admit: 2018-05-13 | Discharge: 2018-05-13 | Disposition: A | Discharge status: Home / Self Care | Payer: Medicare HMO | Source: Ambulatory Visit | Attending: Family Medicine | Admitting: Family Medicine

## 2018-05-13 VITALS — BP 130/70 | HR 79 | Temp 99.0°F | Ht 61.0 in | Wt 146.8 lb

## 2018-05-13 DIAGNOSIS — J101 Influenza due to other identified influenza virus with other respiratory manifestations: Secondary | ICD-10-CM | POA: Diagnosis not present

## 2018-05-13 DIAGNOSIS — R062 Wheezing: Secondary | ICD-10-CM | POA: Diagnosis not present

## 2018-05-13 DIAGNOSIS — R05 Cough: Secondary | ICD-10-CM | POA: Diagnosis not present

## 2018-05-13 DIAGNOSIS — R058 Other specified cough: Secondary | ICD-10-CM

## 2018-05-13 DIAGNOSIS — R059 Cough, unspecified: Secondary | ICD-10-CM

## 2018-05-13 LAB — POC INFLUENZA A&B (BINAX/QUICKVUE)
Influenza A, POC: POSITIVE — AB
Influenza B, POC: NEGATIVE

## 2018-05-13 MED ORDER — PREDNISONE 20 MG PO TABS: ORAL_TABLET | ORAL | 0 refills | Status: DC

## 2018-05-13 MED ORDER — PROMETHAZINE-DM 6.25-15 MG/5ML PO SYRP: 5.0000 mL | ORAL_SOLUTION | Freq: Four times a day (QID) | ORAL | 0 refills | Status: DC | PRN

## 2018-05-13 MED ORDER — ALBUTEROL SULFATE (2.5 MG/3ML) 0.083% IN NEBU
2.5000 mg | INHALATION_SOLUTION | Freq: Once | RESPIRATORY_TRACT | Status: AC
  Administered 2018-05-13: 2.5 mg via RESPIRATORY_TRACT

## 2018-05-13 MED ORDER — AZITHROMYCIN 250 MG PO TABS: ORAL_TABLET | ORAL | 0 refills | Status: DC

## 2018-05-13 MED ORDER — ALBUTEROL SULFATE HFA 108 (90 BASE) MCG/ACT IN AERS: 2.0000 | INHALATION_SPRAY | RESPIRATORY_TRACT | 0 refills | Status: DC | PRN

## 2018-05-13 MED ORDER — BENZONATATE 200 MG PO CAPS: 200.0000 mg | ORAL_CAPSULE | Freq: Three times a day (TID) | ORAL | 1 refills | Status: DC | PRN

## 2018-05-13 MED ORDER — METHYLPREDNISOLONE ACETATE 80 MG/ML IJ SUSP
80.0000 mg | Freq: Once | INTRAMUSCULAR | Status: AC
  Administered 2018-05-13: 80 mg via INTRAMUSCULAR

## 2018-05-13 NOTE — Patient Instructions (Addendum)
I think you have the flu  Drink fluids and rest  If you get more short of breath-go to the emergency room  Have Shanon Brow watch you very closely  Follow up with me tomorrow   Take tessalon and prometh-DM for cough (with caution of sedation)  zpak (antibiotic in case of bacterial infection)  Albuterol - for wheezing  Prednisone - for reactive airways-start tomorrow am as directed  Shot of depo medrol today (to get that started-also a steroid)   Go straight home and have your husband go pick up medicines instead of you

## 2018-05-13 NOTE — Assessment & Plan Note (Signed)
Post rapid flu test Cough/sob and wheezing (reactive airways)  Reassuring CXR Also good resp to albuterol NMT (with improvement of pulse ox to 90% and better air exch)  Depo medrol 80 mg-px 60 mg pred taper Too late for tamiflu (6 d) Tessalon and prometh dm for cough Albuterol mdi with inst in use  zpak to cover for atypical infx husb will watch closely  F/u tomorrow for re check inst to go to ED if symptoms worsen-she agrees  Husband to watch closely

## 2018-05-13 NOTE — Progress Notes (Signed)
Subjective:    Patient ID: Morgan Hubbard, female    DOB: [birth year], 83 y.o.   MRN: [id number]  HPI Here for wheezing and cough , fatigue and nausea    Wt Readings from Last 3 Encounters:  05/13/18 146 lb 12 oz (66.6 kg)  03/09/18 151 lb 4 oz (68.6 kg)  02/17/18 152 lb 4 oz (69.1 kg)   27.73 kg/m   Symptoms started last Thursday with a bad cough  Prod of thick sputum (tinted yellow)  Not a lot of production  Chest is really sore  Started wheezing Sunday   Nasal d/c is clear  No fever   Did not end up taking tessalon  Took tussin for diabetics    Pulse ox 80% on RA at arrival  Albuterol NMT given - pulse ox 90% at end of visit   Rapid flu screen - very faintly pos Had a flu shot in oct  Results for orders placed or performed in visit on 05/13/18  POC Influenza A&B(BINAX/QUICKVUE)  Result Value Ref Range   Influenza A, POC Positive (A) Negative   Influenza B, POC Negative Negative     CXR re assuring  Dg Chest 2 View  Result Date: 05/13/2018 CLINICAL DATA:  Productive cough, wheezing, hypoxia EXAM: CHEST - 2 VIEW COMPARISON:  02/16/2016 FINDINGS: Stable hyperinflation. Left AICD is unchanged. Cardiomegaly. No confluent airspace opacities or effusions. No acute bony abnormality. IMPRESSION: Cardiomegaly, hyperinflation.  No active disease. Electronically Signed   By: Rolm Baptise M.D.   On: 05/13/2018 10:37     Patient Active Problem List   Diagnosis Date Noted  . Productive cough 05/13/2018  . Tingling 10/06/2017  . Chronic back pain 05/23/2017  . Chronic systolic CHF (congestive heart failure) ([place]) 02/25/2017  . Osteopenia 06/16/2016  . Routine general medical examination at a health care facility 04/24/2016  . Estrogen deficiency 04/24/2016  . Urticaria 11/03/2015  . Poorly controlled type 2 diabetes mellitus ([place]) 06/21/2015  . Cerumen impaction 03/10/2015  . Encounter for Medicare annual wellness exam 06/13/2014  . Stroke, small vessel ([place])  01/18/2014  . Family history of hemochromatosis 01/18/2014  . Cerebral thrombosis with cerebral infarction ([place]) 01/14/2014  . Expressive aphasia 01/13/2014  . Nonischemic cardiomyopathy ([place]) 04/05/2013  . Shortness of breath 09/03/2012  . Gallstones 05/26/2012  . Elevated transaminase level 05/18/2012  . Spinal stenosis of lumbar region 12/10/2011  . Mixed incontinence urge and stress 12/10/2011  . Renal insufficiency 11/04/2011  . Goiter 01/14/2011  . Fatty liver 08/28/2010  . DM (diabetes mellitus), type 2 ([place]) 07/09/2010  . HYPERCHOLESTEROLEMIA 07/09/2010  . HYPERKALEMIA 07/09/2010  . REFLEX SYMPATHETIC DYSTROPHY 07/09/2010  . Neuropathy 07/09/2010  . Essential hypertension 07/09/2010  . CARDIOMYOPATHY 07/09/2010  . OVERACTIVE BLADDER 07/09/2010   Past Medical History:  Diagnosis Date  . Arthritis    "hands" (01/13/2014)  . Basal cell carcinoma 01/2014   "bridge of nose"  . Cardiac LV ejection fraction >40%    "it was 43 last year" (01/13/2014)  . Chronic lower back pain   . Colon polyps   . Expressive aphasia    "3 times in the last week" (01/13/2014)  . GERD (gastroesophageal reflux disease)   . Goiter past remote   treated with RI  . Hyperlipidemia   . Hyperpotassemia   . Hypertension   . Hypertonicity of bladder   . Inflammatory and toxic neuropathy, unspecified   . LBBB (left bundle branch block)   . Migraine    "  stopped many years ago" (01/13/2014)  . Mini stroke ([place]) 01/2014  . Other primary cardiomyopathies   . Overactive bladder   . Presence of combination internal cardiac defibrillator (ICD) and pacemaker   . Reflex sympathetic dystrophy, unspecified   . Shingles   . Shortness of breath    ambulation  . Stroke ([place])   . Thyroid disease   . Type II diabetes mellitus ([place])   . Ulcer   . Urine incontinence   . Vertigo    hx of   Past Surgical History:  Procedure Laterality Date  . BACK SURGERY    . BI-VENTRICULAR PACEMAKER INSERTION (CRT-P)   10/2014   [facility]  . BREAST CYST EXCISION Left 1959  . CARDIAC CATHETERIZATION  "several"   [place]  . CATARACT EXTRACTION W/ INTRAOCULAR LENS IMPLANT Left ~ 2005   several eye injections  . COLONOSCOPY  7/12   normal (hx of polyps in past)   . CT SCAN  3/12   outside hosp- lung nodule and gallstones  . [place]   "knot on index"  . KIDNEY DONATION Left 1989  . LUMBAR LAMINECTOMY/DECOMPRESSION MICRODISCECTOMY  1999  . LUMBAR LAMINECTOMY/DECOMPRESSION MICRODISCECTOMY  01/31/2012   Procedure: LUMBAR LAMINECTOMY/DECOMPRESSION MICRODISCECTOMY 2 LEVELS;  Surgeon: Eustace Moore, MD;  Location: [facility];  Service: Neurosurgery;  Laterality: N/A;  Thoracic twelve-lumbar one, lumbar one-two laminectomy   . POSTERIOR LAMINECTOMY / DECOMPRESSION CERVICAL SPINE  1999  . TUBAL LIGATION  1976  . UPPER GASTROINTESTINAL ENDOSCOPY     Social History   Tobacco Use  . Smoking status: Never Smoker  . Smokeless tobacco: Never Used  Substance Use Topics  . Alcohol use: No    Alcohol/week: 0.0 standard drinks  . Drug use: No   Family History  Problem Relation Age of Onset  . Arthritis Mother   . Cancer Mother        uterine and mouth  . Hyperlipidemia Mother   . Stroke Mother   . Hypertension Mother   . Alcohol abuse Father   . Diabetes Father   . Cancer Sister        breast  . Diabetes Sister   . Cancer Brother        lung cancer  . Kidney disease Brother   . Diabetes Brother   . Hyperlipidemia Sister   . Heart disease Sister   . Hypertension Sister   . Diabetes Sister   . Hyperlipidemia Brother   . Kidney failure Brother   . Diabetes Daughter   . Addison's disease Other    Allergies  Allergen Reactions  . Ace Inhibitors Swelling    REACTION: tongue swelling  . Codeine Other (See Comments)    REACTION: nausea  . Hydrochlorothiazide   . Insulin Detemir Itching  . Lisinopril Swelling    Swelling of tongue  . Nsaids Other (See Comments)    Unable to  take D/T kidney issues  . Tylenol [Acetaminophen]    Current Outpatient Medications on File Prior to Visit  Medication Sig Dispense Refill  . aspirin 325 MG tablet Take 325 mg by mouth daily.    Marland Kitchen atorvastatin (LIPITOR) 80 MG tablet Take 80 mg by mouth daily.    . carvedilol (COREG) 12.5 MG tablet Take 1 tablet (12.5 mg total) by mouth 2 (two) times daily with a meal. 180 tablet 4  . cetirizine (ZYRTEC) 10 MG tablet Take 10 mg by mouth 2 (two) times daily.    Marland Kitchen  Cholecalciferol (VITAMIN D-3) 1000 units CAPS Take 1 capsule by mouth daily.    . clotrimazole-betamethasone (LOTRISONE) cream Apply 1 application topically as needed.    . dapsone 25 MG tablet Take 50 mg by mouth daily.    Marland Kitchen gabapentin (NEURONTIN) 300 MG capsule Take 1 capsule (300 mg total) by mouth 3 (three) times daily. 270 capsule 3  . Glimepiride (AMARYL PO) Take 2 mg by mouth daily.     Marland Kitchen glucose blood (ONE TOUCH ULTRA TEST) test strip Use 3 (three) times daily.    . hydrALAZINE (APRESOLINE) 50 MG tablet Take 1 tablet by mouth 2 (two) times daily.    . hydrochlorothiazide (MICROZIDE) 12.5 MG capsule Take 12.5 mg by mouth daily.    . isosorbide mononitrate (IMDUR) 30 MG 24 hr tablet Take 1 tablet (30 mg total) by mouth daily. 90 tablet 4  . letrozole (FEMARA) 2.5 MG tablet Take 2.5 mg by mouth daily.    Marland Kitchen losartan (COZAAR) 50 MG tablet Take 50 mg by mouth daily.    . metFORMIN (GLUCOPHAGE-XR) 500 MG 24 hr tablet Take 1,000 mg by mouth 2 (two) times daily.    Marland Kitchen MILK THISTLE PO Take 1 capsule by mouth 2 (two) times daily.     . nitroGLYCERIN (NITROSTAT) 0.4 MG SL tablet Place 0.4 mg under the tongue every 5 (five) minutes as needed for chest pain.    . pantoprazole (PROTONIX) 40 MG tablet Take 1 tablet (40 mg total) by mouth daily. 90 tablet 3  . traMADol (ULTRAM) 50 MG tablet Take 1-2 tablets (50-100 mg total) by mouth every 8 (eight) hours as needed for moderate pain or severe pain. 60 tablet 0  . vitamin B-12 (CYANOCOBALAMIN)  1000 MCG tablet Take 1,000 mcg by mouth daily.      No current facility-administered medications on file prior to visit.     Review of Systems  Constitutional: Positive for appetite change and fatigue. Negative for chills, diaphoresis and fever.       Malaise  HENT: Positive for congestion, postnasal drip, rhinorrhea, sinus pressure, sneezing and sore throat. Negative for ear pain, mouth sores, nosebleeds and voice change.   Eyes: Negative for pain and discharge.  Respiratory: Positive for cough, chest tightness and wheezing. Negative for shortness of breath and stridor.        Pt states she is only sob during cough fit   Cardiovascular: Negative for chest pain.  Gastrointestinal: Negative for diarrhea, nausea and vomiting.  Genitourinary: Negative for frequency, hematuria and urgency.  Musculoskeletal: Negative for arthralgias, myalgias and neck stiffness.  Skin: Negative for rash.  Neurological: Positive for light-headedness and headaches. Negative for dizziness and weakness.  Psychiatric/Behavioral: Negative for confusion and dysphoric mood.       Objective:   Physical Exam  Constitutional: She appears well-developed and well-nourished. No distress.  Persistent cough  Seems fatigued (but pleasant)  HENT:  Head: Normocephalic and atraumatic.  Right Ear: External ear normal.  Left Ear: External ear normal.  Mouth/Throat: No oropharyngeal exudate.  Nares are injected and congested  No sinus tenderness Throat is clear with heavy clear pnd  Some green nasal d/c     Eyes: Pupils are equal, round, and reactive to light. Conjunctivae and EOM are normal. Right eye exhibits no discharge. Left eye exhibits no discharge. No scleral icterus.  Neck: Normal range of motion. Neck supple.  Cardiovascular: Normal rate, regular rhythm and normal heart sounds.  Pulmonary/Chest: Effort normal. No stridor. No respiratory distress.  She has wheezes. She has no rales. She exhibits tenderness.    bilat upper airway sounds  Wheeze improved after NMT with fair air exch (much clinical improvement after albuterol nmt)   Persistent dry sounding cough   Not sob at rest (suprisingly in light of low pulse ox at presentation)    Abdominal: Soft. Bowel sounds are normal. She exhibits no distension.  Lymphadenopathy:    She has no cervical adenopathy.  Neurological: She is alert. No cranial nerve deficit. Coordination normal.  Skin: Skin is warm and dry. No rash noted. No erythema. No pallor.  Psychiatric: She has a normal mood and affect.  Pleasant           Assessment & Plan:   Problem List Items Addressed This Visit      Respiratory   Influenza A    Post rapid flu test Cough/sob and wheezing (reactive airways)  Reassuring CXR Also good resp to albuterol NMT (with improvement of pulse ox to 90% and better air exch)  Depo medrol 80 mg-px 60 mg pred taper Too late for tamiflu (6 d) Tessalon and prometh dm for cough Albuterol mdi with inst in use  zpak to cover for atypical infx husb will watch closely  F/u tomorrow for re check inst to go to ED if symptoms worsen-she agrees  Husband to watch closely      Relevant Medications   azithromycin (ZITHROMAX Z-PAK) 250 MG tablet     Other   Productive cough - Primary   Relevant Medications   methylPREDNISolone acetate (DEPO-MEDROL) injection 80 mg (Completed)   Other Relevant Orders   DG Chest 2 View (Completed)    Other Visit Diagnoses    Cough       Relevant Medications   albuterol (PROVENTIL) (2.5 MG/3ML) 0.083% nebulizer solution 2.5 mg (Completed)   methylPREDNISolone acetate (DEPO-MEDROL) injection 80 mg (Completed)   Other Relevant Orders   POC Influenza A&B(BINAX/QUICKVUE) (Completed)   Wheezing       Relevant Medications   albuterol (PROVENTIL) (2.5 MG/3ML) 0.083% nebulizer solution 2.5 mg (Completed)   methylPREDNISolone acetate (DEPO-MEDROL) injection 80 mg (Completed)

## 2018-05-14 ENCOUNTER — Ambulatory Visit (INDEPENDENT_AMBULATORY_CARE_PROVIDER_SITE_OTHER): Payer: Medicare HMO | Admitting: Family Medicine

## 2018-05-14 ENCOUNTER — Encounter: Payer: Self-pay | Admitting: Family Medicine

## 2018-05-14 VITALS — BP 150/84 | HR 87 | Temp 97.8°F | Ht 61.0 in | Wt 146.8 lb

## 2018-05-14 DIAGNOSIS — J101 Influenza due to other identified influenza virus with other respiratory manifestations: Secondary | ICD-10-CM | POA: Diagnosis not present

## 2018-05-14 MED ORDER — ALBUTEROL SULFATE (2.5 MG/3ML) 0.083% IN NEBU
2.5000 mg | INHALATION_SOLUTION | Freq: Once | RESPIRATORY_TRACT | Status: AC
  Administered 2018-05-14: 2.5 mg via RESPIRATORY_TRACT

## 2018-05-14 NOTE — Assessment & Plan Note (Signed)
Clinically improving  Pulse ox 93% on RA after albuterol nmt Enc her to use her albuterol mdi Q4 h Take abx and prednisone as directed (aware blood glucose will go up)  Did go ahead and px prophylactic tamiflu to husband as well  Rest/fluids Cough medicine is helping F/u Monday for another re check

## 2018-05-14 NOTE — Patient Instructions (Addendum)
Your blood glucose levels will be quite high on the steroid  Try to eat well - avoid sweets and sugar   You can increase the generic amaryl dose to 4 mg daily   Continue current medicines   If cough or shortness of breath worsen please go to ER and let us know (or if your pulse ox goes low)  You should gradually improve but we want to watch you closely   Follow up on Monday for a re check

## 2018-05-14 NOTE — Progress Notes (Signed)
Subjective:    Patient ID: Morgan Hubbard, female    DOB: Aug 31, 1934, 83 y.o.   MRN: [id number]  HPI Here for f/u of influenza  Seen yesterday- for influenza A with bronchospasm  Neg cxr  NMT/albuterol / depo medrol 80 mg  Px prednisone 60 mg taper, zpack/albuterol and tessalon/prometh DM for cough   Steroid made her blood sugar go up   Cough medicine really helped   She feels a little better Slept very well last night  Pulse ox with ambulation 89%  S/p NMT 92% -improved  93% 10 minutes later  She has a pulse ox machine at home for her husband   (got at low as 82 at home)  Breathing is improved after the steroid shot  Could walk farther w/o sob  Cough is not as deep or hard /slowed down in general   Missed her pm DM meds  Glucose was over 300 this am   She has an adjustable bed - sleeps propped up   No fever    S/p NMT (albuterol)   Patient Active Problem List   Diagnosis Date Noted  . Productive cough 05/13/2018  . Influenza A 05/13/2018  . Tingling 10/06/2017  . Chronic back pain 05/23/2017  . Chronic systolic CHF (congestive heart failure) ([place]) 02/25/2017  . Osteopenia 06/16/2016  . Routine general medical examination at a health care facility 04/24/2016  . Estrogen deficiency 04/24/2016  . Urticaria 11/03/2015  . Poorly controlled type 2 diabetes mellitus ([place]) 06/21/2015  . Cerumen impaction 03/10/2015  . Encounter for Medicare annual wellness exam 06/13/2014  . Stroke, small vessel ([place]) 01/18/2014  . Family history of hemochromatosis 01/18/2014  . Cerebral thrombosis with cerebral infarction ([place]) 01/14/2014  . Expressive aphasia 01/13/2014  . Nonischemic cardiomyopathy ([place]) 04/05/2013  . Shortness of breath 09/03/2012  . Gallstones 05/26/2012  . Elevated transaminase level 05/18/2012  . Spinal stenosis of lumbar region 12/10/2011  . Mixed incontinence urge and stress 12/10/2011  . Renal insufficiency 11/04/2011  . Goiter 01/14/2011  . Fatty  liver 08/28/2010  . DM (diabetes mellitus), type 2 ([place]) 07/09/2010  . HYPERCHOLESTEROLEMIA 07/09/2010  . HYPERKALEMIA 07/09/2010  . REFLEX SYMPATHETIC DYSTROPHY 07/09/2010  . Neuropathy 07/09/2010  . Essential hypertension 07/09/2010  . CARDIOMYOPATHY 07/09/2010  . OVERACTIVE BLADDER 07/09/2010   Past Medical History:  Diagnosis Date  . Arthritis    "hands" (01/13/2014)  . Basal cell carcinoma 01/2014   "bridge of nose"  . Cardiac LV ejection fraction >40%    "it was 43 last year" (01/13/2014)  . Chronic lower back pain   . Colon polyps   . Expressive aphasia    "3 times in the last week" (01/13/2014)  . GERD (gastroesophageal reflux disease)   . Goiter past remote   treated with RI  . Hyperlipidemia   . Hyperpotassemia   . Hypertension   . Hypertonicity of bladder   . Inflammatory and toxic neuropathy, unspecified   . LBBB (left bundle branch block)   . Migraine    "stopped many years ago" (01/13/2014)  . Mini stroke ([place]) 01/2014  . Other primary cardiomyopathies   . Overactive bladder   . Presence of combination internal cardiac defibrillator (ICD) and pacemaker   . Reflex sympathetic dystrophy, unspecified   . Shingles   . Shortness of breath    ambulation  . Stroke ([place])   . Thyroid disease   . Type II diabetes mellitus ([place])   . Ulcer   .  Urine incontinence   . Vertigo    hx of   Past Surgical History:  Procedure Laterality Date  . BACK SURGERY    . BI-VENTRICULAR PACEMAKER INSERTION (CRT-P)  10/2014   [facility]  . BREAST CYST EXCISION Left 1959  . CARDIAC CATHETERIZATION  "several"   [place]  . CATARACT EXTRACTION W/ INTRAOCULAR LENS IMPLANT Left ~ 2005   several eye injections  . COLONOSCOPY  7/12   normal (hx of polyps in past)   . CT SCAN  3/12   outside hosp- lung nodule and gallstones  . [place]   "knot on index"  . KIDNEY DONATION Left 1989  . LUMBAR LAMINECTOMY/DECOMPRESSION MICRODISCECTOMY  1999  . LUMBAR  LAMINECTOMY/DECOMPRESSION MICRODISCECTOMY  01/31/2012   Procedure: LUMBAR LAMINECTOMY/DECOMPRESSION MICRODISCECTOMY 2 LEVELS;  Surgeon: Eustace Moore, MD;  Location: [facility];  Service: Neurosurgery;  Laterality: N/A;  Thoracic twelve-lumbar one, lumbar one-two laminectomy   . POSTERIOR LAMINECTOMY / DECOMPRESSION CERVICAL SPINE  1999  . TUBAL LIGATION  1976  . UPPER GASTROINTESTINAL ENDOSCOPY     Social History   Tobacco Use  . Smoking status: Never Smoker  . Smokeless tobacco: Never Used  Substance Use Topics  . Alcohol use: No    Alcohol/week: 0.0 standard drinks  . Drug use: No   Family History  Problem Relation Age of Onset  . Arthritis Mother   . Cancer Mother        uterine and mouth  . Hyperlipidemia Mother   . Stroke Mother   . Hypertension Mother   . Alcohol abuse Father   . Diabetes Father   . Cancer Sister        breast  . Diabetes Sister   . Cancer Brother        lung cancer  . Kidney disease Brother   . Diabetes Brother   . Hyperlipidemia Sister   . Heart disease Sister   . Hypertension Sister   . Diabetes Sister   . Hyperlipidemia Brother   . Kidney failure Brother   . Diabetes Daughter   . Addison's disease Other    Allergies  Allergen Reactions  . Ace Inhibitors Swelling    REACTION: tongue swelling  . Codeine Other (See Comments)    REACTION: nausea  . Hydrochlorothiazide   . Insulin Detemir Itching  . Lisinopril Swelling    Swelling of tongue  . Nsaids Other (See Comments)    Unable to take D/T kidney issues  . Tylenol [Acetaminophen]    Current Outpatient Medications on File Prior to Visit  Medication Sig Dispense Refill  . albuterol (PROVENTIL HFA;VENTOLIN HFA) 108 (90 Base) MCG/ACT inhaler Inhale 2 puffs into the lungs every 4 (four) hours as needed for wheezing. 1 Inhaler 0  . aspirin 325 MG tablet Take 325 mg by mouth daily.    Marland Kitchen atorvastatin (LIPITOR) 80 MG tablet Take 80 mg by mouth daily.    Marland Kitchen azithromycin (ZITHROMAX Z-PAK)  250 MG tablet Take 2 pills by mouth today and then 1 pill daily for 4 days 6 tablet 0  . benzonatate (TESSALON) 200 MG capsule Take 1 capsule (200 mg total) by mouth 3 (three) times daily as needed. Do not bite pill 30 capsule 1  . carvedilol (COREG) 12.5 MG tablet Take 1 tablet (12.5 mg total) by mouth 2 (two) times daily with a meal. 180 tablet 4  . cetirizine (ZYRTEC) 10 MG tablet Take 10 mg by mouth 2 (two) times  daily.    . Cholecalciferol (VITAMIN D-3) 1000 units CAPS Take 1 capsule by mouth daily.    . clotrimazole-betamethasone (LOTRISONE) cream Apply 1 application topically as needed.    . dapsone 25 MG tablet Take 50 mg by mouth daily.    Marland Kitchen gabapentin (NEURONTIN) 300 MG capsule Take 1 capsule (300 mg total) by mouth 3 (three) times daily. 270 capsule 3  . Glimepiride (AMARYL PO) Take 2 mg by mouth daily.     Marland Kitchen glucose blood (ONE TOUCH ULTRA TEST) test strip Use 3 (three) times daily.    . hydrALAZINE (APRESOLINE) 50 MG tablet Take 1 tablet by mouth 2 (two) times daily.    . hydrochlorothiazide (MICROZIDE) 12.5 MG capsule Take 12.5 mg by mouth daily.    . isosorbide mononitrate (IMDUR) 30 MG 24 hr tablet Take 1 tablet (30 mg total) by mouth daily. 90 tablet 4  . letrozole (FEMARA) 2.5 MG tablet Take 2.5 mg by mouth daily.    Marland Kitchen losartan (COZAAR) 50 MG tablet Take 50 mg by mouth daily.    . metFORMIN (GLUCOPHAGE-XR) 500 MG 24 hr tablet Take 1,000 mg by mouth 2 (two) times daily.    Marland Kitchen MILK THISTLE PO Take 1 capsule by mouth 2 (two) times daily.     . nitroGLYCERIN (NITROSTAT) 0.4 MG SL tablet Place 0.4 mg under the tongue every 5 (five) minutes as needed for chest pain.    . pantoprazole (PROTONIX) 40 MG tablet Take 1 tablet (40 mg total) by mouth daily. 90 tablet 3  . predniSONE (DELTASONE) 20 MG tablet Take 3 pills once daily by mouth for 3 days, then 2 pills once daily for 3 days, then 1 pill once daily for 3 days and then stop 18 tablet 0  . promethazine-dextromethorphan  (PROMETHAZINE-DM) 6.25-15 MG/5ML syrup Take 5 mLs by mouth 4 (four) times daily as needed for cough. Caution of sedation 118 mL 0  . traMADol (ULTRAM) 50 MG tablet Take 1-2 tablets (50-100 mg total) by mouth every 8 (eight) hours as needed for moderate pain or severe pain. 60 tablet 0  . vitamin B-12 (CYANOCOBALAMIN) 1000 MCG tablet Take 1,000 mcg by mouth daily.      No current facility-administered medications on file prior to visit.     Review of Systems  Constitutional: Positive for appetite change and fatigue. Negative for fever.  HENT: Positive for congestion, postnasal drip, rhinorrhea, sinus pressure, sneezing and sore throat. Negative for ear pain.   Eyes: Negative for pain and discharge.  Respiratory: Positive for cough, chest tightness and wheezing. Negative for shortness of breath and stridor.   Cardiovascular: Negative for chest pain.  Gastrointestinal: Negative for diarrhea, nausea and vomiting.  Genitourinary: Negative for frequency, hematuria and urgency.  Musculoskeletal: Negative for arthralgias and myalgias.  Skin: Negative for rash.  Neurological: Positive for headaches. Negative for dizziness, weakness and light-headedness.  Psychiatric/Behavioral: Negative for confusion and dysphoric mood.       Objective:   Physical Exam Constitutional:      General: She is not in acute distress.    Appearance: She is well-developed.     Comments: Improved appearance- more energy and breathing better   HENT:     Head: Normocephalic and atraumatic.     Comments: Nares are injected and congested  No sinus tenderness Throat is clear      Right Ear: External ear normal.     Left Ear: External ear normal.     Nose: Congestion and  rhinorrhea present.     Mouth/Throat:     Mouth: Mucous membranes are moist.     Pharynx: No oropharyngeal exudate or posterior oropharyngeal erythema.  Eyes:     General:        Right eye: No discharge.        Left eye: No discharge.      Conjunctiva/sclera: Conjunctivae normal.     Pupils: Pupils are equal, round, and reactive to light.  Neck:     Musculoskeletal: Normal range of motion and neck supple.  Cardiovascular:     Rate and Rhythm: Normal rate.     Heart sounds: Normal heart sounds.  Pulmonary:     Effort: Pulmonary effort is normal. No respiratory distress.     Breath sounds: Normal breath sounds. No stridor. No wheezing, rhonchi or rales.     Comments: Harsh bs  Upper airway sounds noted  Exp wheezing greatly improves after albuterol nmt  No rales/rhonchi  No stridor  Scant crackles at the bases  Chest:     Chest wall: No tenderness.  Lymphadenopathy:     Cervical: No cervical adenopathy.  Skin:    General: Skin is warm and dry.     Findings: No rash.  Neurological:     Mental Status: She is alert.     Cranial Nerves: No cranial nerve deficit.  Psychiatric:        Mood and Affect: Mood normal.           Assessment & Plan:   Problem List Items Addressed This Visit      Respiratory   Influenza A - Primary    Clinically improving  Pulse ox 93% on RA after albuterol nmt Enc her to use her albuterol mdi Q4 h Take abx and prednisone as directed (aware blood glucose will go up)  Did go ahead and px prophylactic tamiflu to husband as well  Rest/fluids Cough medicine is helping F/u Monday for another re check       Relevant Medications   albuterol (PROVENTIL) (2.5 MG/3ML) 0.083% nebulizer solution 2.5 mg (Completed)

## 2018-05-18 ENCOUNTER — Ambulatory Visit (INDEPENDENT_AMBULATORY_CARE_PROVIDER_SITE_OTHER): Payer: Medicare HMO | Admitting: Family Medicine

## 2018-05-18 ENCOUNTER — Encounter: Payer: Self-pay | Admitting: Family Medicine

## 2018-05-18 VITALS — BP 146/78 | HR 77 | Temp 98.0°F | Ht 61.0 in | Wt 142.5 lb

## 2018-05-18 DIAGNOSIS — J101 Influenza due to other identified influenza virus with other respiratory manifestations: Secondary | ICD-10-CM | POA: Diagnosis not present

## 2018-05-18 NOTE — Patient Instructions (Signed)
Your exam is much improved Continue the prednisone   Try to eat and drink a bit  Gradually increase your activity-short periods of time  Mainly rest  Let us know if you need more cough medicine Use inhaler as needed   Sleep propped up   Watch for fever/ productive cough/worse cough or return of wheezing   Keep me posted- update [place] (call) mid week and let me know how you are doing

## 2018-05-18 NOTE — Assessment & Plan Note (Signed)
Much improved exam with no wheezing  Pulse ox improved to 95-96% sitting   Still very fatigued -disc expectations for very slow return to activity as tolerated Stressed rest and fluids Cough control -tessalon and prometh DM (will call for refill if needed) Prop up for pm cough  Finish prednisone  Husband will keep following her pulse ox at home  inst pt to call mid week with an update re: how she is  Update if not starting to improve in a week or if worsening  -- incl fever/worse cough/prod cough/ wheezing or any worse symptoms

## 2018-05-18 NOTE — Progress Notes (Signed)
Subjective:    Patient ID: Morgan Hubbard, female    DOB: 1934/08/17, 83 y.o.   MRN: [id number]  HPI Here for f/u of influenza   Wt Readings from Last 3 Encounters:  05/18/18 142 lb 8 oz (64.6 kg)  05/14/18 146 lb 12 oz (66.6 kg)  05/13/18 146 lb 12 oz (66.6 kg)   Temp: 98 F (36.7 C)   Pulse ox 90% at presentation (still holding there) -at home (after walking down hall) After sitting today 95-96% on RA Prednisone  zpak for length/severity of illness  Albuterol mdi   Still feels lousy  Tired and weak  Less wheezing today  Cough-non prod  No symptoms are worse  Cough syrup really hurts    Patient Active Problem List   Diagnosis Date Noted  . Productive cough 05/13/2018  . Influenza A 05/13/2018  . Tingling 10/06/2017  . Chronic back pain 05/23/2017  . Chronic systolic CHF (congestive heart failure) ([place]) 02/25/2017  . Osteopenia 06/16/2016  . Routine general medical examination at a health care facility 04/24/2016  . Estrogen deficiency 04/24/2016  . Urticaria 11/03/2015  . Poorly controlled type 2 diabetes mellitus ([place]) 06/21/2015  . Cerumen impaction 03/10/2015  . Encounter for Medicare annual wellness exam 06/13/2014  . Stroke, small vessel ([place]) 01/18/2014  . Family history of hemochromatosis 01/18/2014  . Cerebral thrombosis with cerebral infarction ([place]) 01/14/2014  . Expressive aphasia 01/13/2014  . Nonischemic cardiomyopathy ([place]) 04/05/2013  . Shortness of breath 09/03/2012  . Gallstones 05/26/2012  . Elevated transaminase level 05/18/2012  . Spinal stenosis of lumbar region 12/10/2011  . Mixed incontinence urge and stress 12/10/2011  . Renal insufficiency 11/04/2011  . Goiter 01/14/2011  . Fatty liver 08/28/2010  . DM (diabetes mellitus), type 2 ([place]) 07/09/2010  . HYPERCHOLESTEROLEMIA 07/09/2010  . HYPERKALEMIA 07/09/2010  . REFLEX SYMPATHETIC DYSTROPHY 07/09/2010  . Neuropathy 07/09/2010  . Essential hypertension 07/09/2010  . CARDIOMYOPATHY  07/09/2010  . OVERACTIVE BLADDER 07/09/2010   Past Medical History:  Diagnosis Date  . Arthritis    "hands" (01/13/2014)  . Basal cell carcinoma 01/2014   "bridge of nose"  . Cardiac LV ejection fraction >40%    "it was 43 last year" (01/13/2014)  . Chronic lower back pain   . Colon polyps   . Expressive aphasia    "3 times in the last week" (01/13/2014)  . GERD (gastroesophageal reflux disease)   . Goiter past remote   treated with RI  . Hyperlipidemia   . Hyperpotassemia   . Hypertension   . Hypertonicity of bladder   . Inflammatory and toxic neuropathy, unspecified   . LBBB (left bundle branch block)   . Migraine    "stopped many years ago" (01/13/2014)  . Mini stroke ([place]) 01/2014  . Other primary cardiomyopathies   . Overactive bladder   . Presence of combination internal cardiac defibrillator (ICD) and pacemaker   . Reflex sympathetic dystrophy, unspecified   . Shingles   . Shortness of breath    ambulation  . Stroke ([place])   . Thyroid disease   . Type II diabetes mellitus ([place])   . Ulcer   . Urine incontinence   . Vertigo    hx of   Past Surgical History:  Procedure Laterality Date  . BACK SURGERY    . BI-VENTRICULAR PACEMAKER INSERTION (CRT-P)  10/2014   [facility]  . BREAST CYST EXCISION Left 1959  . CARDIAC CATHETERIZATION  "several"   [place]  . CATARACT EXTRACTION  W/ INTRAOCULAR LENS IMPLANT Left ~ 2005   several eye injections  . COLONOSCOPY  7/12   normal (hx of polyps in past)   . CT SCAN  3/12   outside hosp- lung nodule and gallstones  . [place]   "knot on index"  . KIDNEY DONATION Left 1989  . LUMBAR LAMINECTOMY/DECOMPRESSION MICRODISCECTOMY  1999  . LUMBAR LAMINECTOMY/DECOMPRESSION MICRODISCECTOMY  01/31/2012   Procedure: LUMBAR LAMINECTOMY/DECOMPRESSION MICRODISCECTOMY 2 LEVELS;  Surgeon: Eustace Moore, MD;  Location: [facility];  Service: Neurosurgery;  Laterality: N/A;  Thoracic twelve-lumbar one, lumbar one-two  laminectomy   . POSTERIOR LAMINECTOMY / DECOMPRESSION CERVICAL SPINE  1999  . TUBAL LIGATION  1976  . UPPER GASTROINTESTINAL ENDOSCOPY     Social History   Tobacco Use  . Smoking status: Never Smoker  . Smokeless tobacco: Never Used  Substance Use Topics  . Alcohol use: No    Alcohol/week: 0.0 standard drinks  . Drug use: No   Family History  Problem Relation Age of Onset  . Arthritis Mother   . Cancer Mother        uterine and mouth  . Hyperlipidemia Mother   . Stroke Mother   . Hypertension Mother   . Alcohol abuse Father   . Diabetes Father   . Cancer Sister        breast  . Diabetes Sister   . Cancer Brother        lung cancer  . Kidney disease Brother   . Diabetes Brother   . Hyperlipidemia Sister   . Heart disease Sister   . Hypertension Sister   . Diabetes Sister   . Hyperlipidemia Brother   . Kidney failure Brother   . Diabetes Daughter   . Addison's disease Other    Allergies  Allergen Reactions  . Ace Inhibitors Swelling    REACTION: tongue swelling  . Codeine Other (See Comments)    REACTION: nausea  . Hydrochlorothiazide   . Insulin Detemir Itching  . Lisinopril Swelling    Swelling of tongue  . Nsaids Other (See Comments)    Unable to take D/T kidney issues  . Tylenol [Acetaminophen]    Current Outpatient Medications on File Prior to Visit  Medication Sig Dispense Refill  . albuterol (PROVENTIL HFA;VENTOLIN HFA) 108 (90 Base) MCG/ACT inhaler Inhale 2 puffs into the lungs every 4 (four) hours as needed for wheezing. 1 Inhaler 0  . aspirin 325 MG tablet Take 325 mg by mouth daily.    Marland Kitchen atorvastatin (LIPITOR) 80 MG tablet Take 80 mg by mouth daily.    . benzonatate (TESSALON) 200 MG capsule Take 1 capsule (200 mg total) by mouth 3 (three) times daily as needed. Do not bite pill 30 capsule 1  . carvedilol (COREG) 12.5 MG tablet Take 1 tablet (12.5 mg total) by mouth 2 (two) times daily with a meal. 180 tablet 4  . cetirizine (ZYRTEC) 10 MG  tablet Take 10 mg by mouth 2 (two) times daily.    . Cholecalciferol (VITAMIN D-3) 1000 units CAPS Take 1 capsule by mouth daily.    . clotrimazole-betamethasone (LOTRISONE) cream Apply 1 application topically as needed.    . dapsone 25 MG tablet Take 50 mg by mouth daily.    Marland Kitchen gabapentin (NEURONTIN) 300 MG capsule Take 1 capsule (300 mg total) by mouth 3 (three) times daily. 270 capsule 3  . Glimepiride (AMARYL PO) Take 2 mg by mouth daily.     Marland Kitchen  glucose blood (ONE TOUCH ULTRA TEST) test strip Use 3 (three) times daily.    . hydrALAZINE (APRESOLINE) 50 MG tablet Take 1 tablet by mouth 2 (two) times daily.    . hydrochlorothiazide (MICROZIDE) 12.5 MG capsule Take 12.5 mg by mouth daily.    . isosorbide mononitrate (IMDUR) 30 MG 24 hr tablet Take 1 tablet (30 mg total) by mouth daily. 90 tablet 4  . letrozole (FEMARA) 2.5 MG tablet Take 2.5 mg by mouth daily.    Marland Kitchen losartan (COZAAR) 50 MG tablet Take 50 mg by mouth daily.    . metFORMIN (GLUCOPHAGE-XR) 500 MG 24 hr tablet Take 1,000 mg by mouth 2 (two) times daily.    Marland Kitchen MILK THISTLE PO Take 1 capsule by mouth 2 (two) times daily.     . nitroGLYCERIN (NITROSTAT) 0.4 MG SL tablet Place 0.4 mg under the tongue every 5 (five) minutes as needed for chest pain.    . pantoprazole (PROTONIX) 40 MG tablet Take 1 tablet (40 mg total) by mouth daily. 90 tablet 3  . predniSONE (DELTASONE) 20 MG tablet Take 3 pills once daily by mouth for 3 days, then 2 pills once daily for 3 days, then 1 pill once daily for 3 days and then stop 18 tablet 0  . promethazine-dextromethorphan (PROMETHAZINE-DM) 6.25-15 MG/5ML syrup Take 5 mLs by mouth 4 (four) times daily as needed for cough. Caution of sedation 118 mL 0  . traMADol (ULTRAM) 50 MG tablet Take 1-2 tablets (50-100 mg total) by mouth every 8 (eight) hours as needed for moderate pain or severe pain. 60 tablet 0  . vitamin B-12 (CYANOCOBALAMIN) 1000 MCG tablet Take 1,000 mcg by mouth daily.      No current  facility-administered medications on file prior to visit.      Review of Systems  Constitutional: Positive for activity change, appetite change and fatigue. Negative for chills, diaphoresis, fever and unexpected weight change.       Very tired   HENT: Positive for congestion and postnasal drip. Negative for ear discharge, ear pain, facial swelling, rhinorrhea, sinus pressure, sore throat and voice change.   Eyes: Negative for pain, redness and visual disturbance.  Respiratory: Positive for cough and wheezing. Negative for choking, chest tightness and shortness of breath.        Wheezing is improved today  Cardiovascular: Negative for chest pain and palpitations.  Gastrointestinal: Negative for abdominal pain, blood in stool, constipation and diarrhea.  Endocrine: Negative for polydipsia and polyuria.  Genitourinary: Negative for dysuria, frequency and urgency.  Musculoskeletal: Negative for arthralgias, back pain and myalgias.  Skin: Negative for pallor and rash.  Allergic/Immunologic: Negative for environmental allergies.  Neurological: Negative for dizziness, syncope and headaches.  Hematological: Negative for adenopathy. Does not bruise/bleed easily.  Psychiatric/Behavioral: Negative for decreased concentration and dysphoric mood. The patient is not nervous/anxious.        Objective:   Physical Exam Constitutional:      General: She is not in acute distress.    Appearance: Normal appearance. She is not ill-appearing or diaphoretic.     Comments: Fatigued   HENT:     Head: Normocephalic and atraumatic.     Right Ear: Tympanic membrane and ear canal normal.     Left Ear: Tympanic membrane and ear canal normal.     Nose: Congestion present. No rhinorrhea.     Mouth/Throat:     Mouth: Mucous membranes are moist.     Pharynx: No oropharyngeal exudate or  posterior oropharyngeal erythema.  Eyes:     General: No scleral icterus.       Right eye: No discharge.        Left eye: No  discharge.     Extraocular Movements: Extraocular movements intact.     Conjunctiva/sclera: Conjunctivae normal.     Pupils: Pupils are equal, round, and reactive to light.  Neck:     Musculoskeletal: Normal range of motion. No neck rigidity.  Cardiovascular:     Rate and Rhythm: Normal rate and regular rhythm.     Heart sounds: No murmur.  Pulmonary:     Effort: Pulmonary effort is normal. No respiratory distress.     Breath sounds: No stridor. Rhonchi present. No wheezing or rales.     Comments: No wheeze or prolonged exp phase   Few scattered rhonchi No rales Scant crackles at bases Upper airway sounds-improved   Chest:     Chest wall: No tenderness.  Lymphadenopathy:     Cervical: No cervical adenopathy.  Skin:    General: Skin is warm.     Findings: No rash.  Neurological:     General: No focal deficit present.     Mental Status: She is alert.     Cranial Nerves: No cranial nerve deficit.  Psychiatric:        Mood and Affect: Mood normal.           Assessment & Plan:   Problem List Items Addressed This Visit      Respiratory   Influenza A - Primary    Much improved exam with no wheezing  Pulse ox improved to 95-96% sitting   Still very fatigued -disc expectations for very slow return to activity as tolerated Stressed rest and fluids Cough control -tessalon and prometh DM (will call for refill if needed) Prop up for pm cough  Finish prednisone  Husband will keep following her pulse ox at home  inst pt to call mid week with an update re: how she is  Update if not starting to improve in a week or if worsening  -- incl fever/worse cough/prod cough/ wheezing or any worse symptoms

## 2018-05-22 ENCOUNTER — Other Ambulatory Visit: Payer: Self-pay | Admitting: Family Medicine

## 2018-05-22 NOTE — Telephone Encounter (Signed)
I sent the refill  Some pharmacies have not been able to get this medication but sounds like [facility] has it since they requested it   [facility] she is starting to feel better  Please call and check in with her on Monday Thanks

## 2018-05-25 NOTE — Telephone Encounter (Signed)
appt scheduled for tomorrow

## 2018-05-25 NOTE — Telephone Encounter (Signed)
Pt returning your call. Please call pt.

## 2018-05-25 NOTE — Telephone Encounter (Signed)
Pt said she is still weak and a lot of cough and congestion. Cough is really productive but she isn't coughing anything up she can just feel the fluid in her chest, she has been taking the the cough syrup Dr. [name] sent in, and using inhaler as needed but she ? If needs another round of meds since she is feeling so weak. Pt still can't gets pulse Ox over 90% but she said she hasn't had a fever in the last few days

## 2018-05-25 NOTE — Telephone Encounter (Signed)
Left VM requesting pt to call back with and update on how she's feeling

## 2018-05-25 NOTE — Telephone Encounter (Signed)
Thanks - I'm glad temp is down  She should probably come in for a re check tomorrow or Thursday so I can listen to her

## 2018-05-26 ENCOUNTER — Ambulatory Visit (INDEPENDENT_AMBULATORY_CARE_PROVIDER_SITE_OTHER): Payer: Medicare HMO | Admitting: Family Medicine

## 2018-05-26 ENCOUNTER — Encounter: Payer: Self-pay | Admitting: Family Medicine

## 2018-05-26 VITALS — BP 108/68 | HR 75 | Temp 98.0°F | Ht 61.0 in | Wt 142.0 lb

## 2018-05-26 DIAGNOSIS — R69 Illness, unspecified: Secondary | ICD-10-CM | POA: Diagnosis not present

## 2018-05-26 DIAGNOSIS — J101 Influenza due to other identified influenza virus with other respiratory manifestations: Secondary | ICD-10-CM

## 2018-05-26 MED ORDER — AZITHROMYCIN 250 MG PO TABS: ORAL_TABLET | ORAL | 0 refills | Status: DC

## 2018-05-26 NOTE — Progress Notes (Signed)
Subjective:    Patient ID: Morgan Hubbard, female    DOB: 1935/04/14, 83 y.o.   MRN: [id number]  HPI Here for re check of flu symptoms   Wt Readings from Last 3 Encounters:  05/26/18 142 lb (64.4 kg)  05/18/18 142 lb 8 oz (64.6 kg)  05/14/18 146 lb 12 oz (66.6 kg)   26.83 kg/m   Pulse ox 92% Temp 98  tx with prednisone and tamiflu  Dg Chest 2 View  Result Date: 05/13/2018 CLINICAL DATA:  Productive cough, wheezing, hypoxia EXAM: CHEST - 2 VIEW COMPARISON:  02/16/2016 FINDINGS: Stable hyperinflation. Left AICD is unchanged. Cardiomegaly. No confluent airspace opacities or effusions. No acute bony abnormality. IMPRESSION: Cardiomegaly, hyperinflation.  No active disease. Electronically Signed   By: Rolm Baptise M.D.   On: 05/13/2018 10:37   Still feels weak Exhausted  A lot of congestion - deep in lungs - cannot get it all out  Just got her DM cough med (was on back order)  No longer hears wheezing  Pulse ox at home- 90%-- re check 96% (sitting 5 min)  occ wakes up coughing   No fever    Not eating much-poor appetite  She is drinking fluids Inside of mouth is sore   Patient Active Problem List   Diagnosis Date Noted  . Productive cough 05/13/2018  . Influenza A 05/13/2018  . Tingling 10/06/2017  . Chronic back pain 05/23/2017  . Chronic systolic CHF (congestive heart failure) ([place]) 02/25/2017  . Osteopenia 06/16/2016  . Routine general medical examination at a health care facility 04/24/2016  . Estrogen deficiency 04/24/2016  . Urticaria 11/03/2015  . Poorly controlled type 2 diabetes mellitus ([place]) 06/21/2015  . Cerumen impaction 03/10/2015  . Encounter for Medicare annual wellness exam 06/13/2014  . Stroke, small vessel ([place]) 01/18/2014  . Family history of hemochromatosis 01/18/2014  . Cerebral thrombosis with cerebral infarction ([place]) 01/14/2014  . Expressive aphasia 01/13/2014  . Nonischemic cardiomyopathy ([place]) 04/05/2013  . Shortness of breath  09/03/2012  . Gallstones 05/26/2012  . Elevated transaminase level 05/18/2012  . Spinal stenosis of lumbar region 12/10/2011  . Mixed incontinence urge and stress 12/10/2011  . Renal insufficiency 11/04/2011  . Goiter 01/14/2011  . Fatty liver 08/28/2010  . DM (diabetes mellitus), type 2 ([place]) 07/09/2010  . HYPERCHOLESTEROLEMIA 07/09/2010  . HYPERKALEMIA 07/09/2010  . REFLEX SYMPATHETIC DYSTROPHY 07/09/2010  . Neuropathy 07/09/2010  . Essential hypertension 07/09/2010  . CARDIOMYOPATHY 07/09/2010  . OVERACTIVE BLADDER 07/09/2010   Past Medical History:  Diagnosis Date  . Arthritis    "hands" (01/13/2014)  . Basal cell carcinoma 01/2014   "bridge of nose"  . Cardiac LV ejection fraction >40%    "it was 43 last year" (01/13/2014)  . Chronic lower back pain   . Colon polyps   . Expressive aphasia    "3 times in the last week" (01/13/2014)  . GERD (gastroesophageal reflux disease)   . Goiter past remote   treated with RI  . Hyperlipidemia   . Hyperpotassemia   . Hypertension   . Hypertonicity of bladder   . Inflammatory and toxic neuropathy, unspecified   . LBBB (left bundle branch block)   . Migraine    "stopped many years ago" (01/13/2014)  . Mini stroke ([place]) 01/2014  . Other primary cardiomyopathies   . Overactive bladder   . Presence of combination internal cardiac defibrillator (ICD) and pacemaker   . Reflex sympathetic dystrophy, unspecified   . Shingles   .  Shortness of breath    ambulation  . Stroke ([place])   . Thyroid disease   . Type II diabetes mellitus ([place])   . Ulcer   . Urine incontinence   . Vertigo    hx of   Past Surgical History:  Procedure Laterality Date  . BACK SURGERY    . BI-VENTRICULAR PACEMAKER INSERTION (CRT-P)  10/2014   [facility]  . BREAST CYST EXCISION Left 1959  . CARDIAC CATHETERIZATION  "several"   [place]  . CATARACT EXTRACTION W/ INTRAOCULAR LENS IMPLANT Left ~ 2005   several eye injections  . COLONOSCOPY  7/12   normal (hx of  polyps in past)   . CT SCAN  3/12   outside hosp- lung nodule and gallstones  . [place]   "knot on index"  . KIDNEY DONATION Left 1989  . LUMBAR LAMINECTOMY/DECOMPRESSION MICRODISCECTOMY  1999  . LUMBAR LAMINECTOMY/DECOMPRESSION MICRODISCECTOMY  01/31/2012   Procedure: LUMBAR LAMINECTOMY/DECOMPRESSION MICRODISCECTOMY 2 LEVELS;  Surgeon: Eustace Moore, MD;  Location: [facility];  Service: Neurosurgery;  Laterality: N/A;  Thoracic twelve-lumbar one, lumbar one-two laminectomy   . POSTERIOR LAMINECTOMY / DECOMPRESSION CERVICAL SPINE  1999  . TUBAL LIGATION  1976  . UPPER GASTROINTESTINAL ENDOSCOPY     Social History   Tobacco Use  . Smoking status: Never Smoker  . Smokeless tobacco: Never Used  Substance Use Topics  . Alcohol use: No    Alcohol/week: 0.0 standard drinks  . Drug use: No   Family History  Problem Relation Age of Onset  . Arthritis Mother   . Cancer Mother        uterine and mouth  . Hyperlipidemia Mother   . Stroke Mother   . Hypertension Mother   . Alcohol abuse Father   . Diabetes Father   . Cancer Sister        breast  . Diabetes Sister   . Cancer Brother        lung cancer  . Kidney disease Brother   . Diabetes Brother   . Hyperlipidemia Sister   . Heart disease Sister   . Hypertension Sister   . Diabetes Sister   . Hyperlipidemia Brother   . Kidney failure Brother   . Diabetes Daughter   . Addison's disease Other    Allergies  Allergen Reactions  . Ace Inhibitors Swelling    REACTION: tongue swelling  . Codeine Other (See Comments)    REACTION: nausea  . Hydrochlorothiazide   . Insulin Detemir Itching  . Lisinopril Swelling    Swelling of tongue  . Nsaids Other (See Comments)    Unable to take D/T kidney issues  . Tylenol [Acetaminophen]    Current Outpatient Medications on File Prior to Visit  Medication Sig Dispense Refill  . albuterol (PROVENTIL HFA;VENTOLIN HFA) 108 (90 Base) MCG/ACT inhaler Inhale 2 puffs  into the lungs every 4 (four) hours as needed for wheezing. 1 Inhaler 0  . aspirin 325 MG tablet Take 325 mg by mouth daily.    Marland Kitchen atorvastatin (LIPITOR) 80 MG tablet Take 80 mg by mouth daily.    . benzonatate (TESSALON) 200 MG capsule Take 1 capsule (200 mg total) by mouth 3 (three) times daily as needed. Do not bite pill 30 capsule 1  . carvedilol (COREG) 12.5 MG tablet Take 1 tablet (12.5 mg total) by mouth 2 (two) times daily with a meal. 180 tablet 4  . cetirizine (ZYRTEC) 10 MG tablet Take 10  mg by mouth 2 (two) times daily.    . Cholecalciferol (VITAMIN D-3) 1000 units CAPS Take 1 capsule by mouth daily.    . clotrimazole-betamethasone (LOTRISONE) cream Apply 1 application topically as needed.    . dapsone 25 MG tablet Take 50 mg by mouth daily.    Marland Kitchen gabapentin (NEURONTIN) 300 MG capsule Take 1 capsule (300 mg total) by mouth 3 (three) times daily. 270 capsule 3  . Glimepiride (AMARYL PO) Take 2 mg by mouth daily.     Marland Kitchen glucose blood (ONE TOUCH ULTRA TEST) test strip Use 3 (three) times daily.    . hydrALAZINE (APRESOLINE) 50 MG tablet Take 1 tablet by mouth 2 (two) times daily.    . hydrochlorothiazide (MICROZIDE) 12.5 MG capsule Take 12.5 mg by mouth daily.    . isosorbide mononitrate (IMDUR) 30 MG 24 hr tablet Take 1 tablet (30 mg total) by mouth daily. 90 tablet 4  . letrozole (FEMARA) 2.5 MG tablet Take 2.5 mg by mouth daily.    Marland Kitchen losartan (COZAAR) 50 MG tablet Take 50 mg by mouth daily.    . metFORMIN (GLUCOPHAGE-XR) 500 MG 24 hr tablet Take 1,000 mg by mouth 2 (two) times daily.    Marland Kitchen MILK THISTLE PO Take 1 capsule by mouth 2 (two) times daily.     . nitroGLYCERIN (NITROSTAT) 0.4 MG SL tablet Place 0.4 mg under the tongue every 5 (five) minutes as needed for chest pain.    . pantoprazole (PROTONIX) 40 MG tablet Take 1 tablet (40 mg total) by mouth daily. 90 tablet 3  . promethazine-dextromethorphan (PROMETHAZINE-DM) 6.25-15 MG/5ML syrup TAKE 5 ML BY MOUTH 4 TIMES DAILY AS NEEDED FOR  COUGH (CAUTION  SEDATION) 118 mL 0  . traMADol (ULTRAM) 50 MG tablet Take 1-2 tablets (50-100 mg total) by mouth every 8 (eight) hours as needed for moderate pain or severe pain. 60 tablet 0  . vitamin B-12 (CYANOCOBALAMIN) 1000 MCG tablet Take 1,000 mcg by mouth daily.      No current facility-administered medications on file prior to visit.     Review of Systems  Constitutional: Positive for fatigue. Negative for activity change, appetite change, chills, diaphoresis, fever and unexpected weight change.       Very tired  HENT: Negative for congestion, ear pain, rhinorrhea, sinus pressure and sore throat.   Eyes: Negative for pain, redness and visual disturbance.  Respiratory: Positive for cough. Negative for chest tightness, shortness of breath, wheezing and stridor.        Has coughing fits/spells  Cardiovascular: Negative for chest pain and palpitations.  Gastrointestinal: Negative for abdominal pain, blood in stool, constipation and diarrhea.  Endocrine: Negative for polydipsia and polyuria.  Genitourinary: Negative for dysuria, frequency and urgency.  Musculoskeletal: Negative for arthralgias, back pain and myalgias.  Skin: Negative for pallor and rash.  Allergic/Immunologic: Negative for environmental allergies.  Neurological: Negative for dizziness, syncope and headaches.  Hematological: Negative for adenopathy. Does not bruise/bleed easily.  Psychiatric/Behavioral: Negative for decreased concentration and dysphoric mood. The patient is not nervous/anxious.        Objective:   Physical Exam Constitutional:      General: She is not in acute distress.    Appearance: Normal appearance. She is not ill-appearing, toxic-appearing or diaphoretic.     Comments: Fatigued appearing but not sick  HENT:     Head: Normocephalic and atraumatic.     Nose: Nose normal.     Comments: Boggy nares    Mouth/Throat:  Mouth: Mucous membranes are moist.     Pharynx: No posterior  oropharyngeal erythema.  Eyes:     General: No scleral icterus.       Right eye: No discharge.        Left eye: No discharge.     Extraocular Movements: Extraocular movements intact.     Conjunctiva/sclera: Conjunctivae normal.     Pupils: Pupils are equal, round, and reactive to light.  Neck:     Musculoskeletal: Normal range of motion. No neck rigidity.  Cardiovascular:     Rate and Rhythm: Normal rate and regular rhythm.     Pulses: Normal pulses.  Pulmonary:     Effort: Pulmonary effort is normal. No respiratory distress.     Breath sounds: Normal breath sounds. No stridor. No wheezing, rhonchi or rales.     Comments: Few dry crackles at each base Good air exch No wheeze even on forced exp Croupy cough  Lymphadenopathy:     Cervical: No cervical adenopathy.  Skin:    General: Skin is warm and dry.     Capillary Refill: Capillary refill takes less than 2 seconds.     Coloration: Skin is not pale.     Findings: No rash.  Neurological:     General: No focal deficit present.     Mental Status: She is alert and oriented to person, place, and time.     Coordination: Coordination normal.  Psychiatric:        Mood and Affect: Mood normal.           Assessment & Plan:   Problem List Items Addressed This Visit      Respiratory   Influenza A - Primary    Continued clinical improvement  Reassuring exam and improved pulse ox  Px azithromycin in light of length of illness (consider cxr if worse or no further imp)  prometh DM Plain expectorant otc prn  Update if not starting to improve in a week or if worsening   Disc time line for getting energy back       Relevant Medications   azithromycin (ZITHROMAX Z-PAK) 250 MG tablet

## 2018-05-26 NOTE — Patient Instructions (Signed)
Drink lots of fluids  Eat when you feel like it  Rest as much as you can   Take the zpak as directed (antibiotic) Get plain mucinex for congestion (loosens mucous)  Get the cough syrup we px (caution of sedation)   Update if not starting to improve in a week or if worsening

## 2018-05-27 NOTE — Assessment & Plan Note (Signed)
Continued clinical improvement  Reassuring exam and improved pulse ox  Px azithromycin in light of length of illness (consider cxr if worse or no further imp)  prometh DM Plain expectorant otc prn  Update if not starting to improve in a week or if worsening   Disc time line for getting energy back

## 2018-06-07 ENCOUNTER — Telehealth: Payer: Self-pay | Admitting: Family Medicine

## 2018-06-07 DIAGNOSIS — N289 Disorder of kidney and ureter, unspecified: Secondary | ICD-10-CM

## 2018-06-07 DIAGNOSIS — E7800 Pure hypercholesterolemia, unspecified: Secondary | ICD-10-CM

## 2018-06-07 DIAGNOSIS — I10 Essential (primary) hypertension: Secondary | ICD-10-CM

## 2018-06-07 NOTE — Telephone Encounter (Signed)
-----   Message from Eustace Pen, LPN sent at [phone]  2:01 PM EST ----- Regarding: Labs 1/7 Lab orders needed. Thank you.  Insurance:  [employer]

## 2018-06-09 ENCOUNTER — Ambulatory Visit (INDEPENDENT_AMBULATORY_CARE_PROVIDER_SITE_OTHER): Payer: Medicare HMO | Admitting: Family Medicine

## 2018-06-09 ENCOUNTER — Encounter: Payer: Self-pay | Admitting: Family Medicine

## 2018-06-09 ENCOUNTER — Ambulatory Visit: Payer: Medicare HMO

## 2018-06-09 VITALS — BP 134/60 | HR 68 | Temp 98.2°F | Ht 60.5 in | Wt 141.5 lb

## 2018-06-09 DIAGNOSIS — E1165 Type 2 diabetes mellitus with hyperglycemia: Secondary | ICD-10-CM | POA: Diagnosis not present

## 2018-06-09 DIAGNOSIS — E1129 Type 2 diabetes mellitus with other diabetic kidney complication: Secondary | ICD-10-CM | POA: Diagnosis not present

## 2018-06-09 DIAGNOSIS — M8589 Other specified disorders of bone density and structure, multiple sites: Secondary | ICD-10-CM

## 2018-06-09 DIAGNOSIS — I10 Essential (primary) hypertension: Secondary | ICD-10-CM | POA: Diagnosis not present

## 2018-06-09 DIAGNOSIS — I428 Other cardiomyopathies: Secondary | ICD-10-CM | POA: Diagnosis not present

## 2018-06-09 DIAGNOSIS — K8050 Calculus of bile duct without cholangitis or cholecystitis without obstruction: Secondary | ICD-10-CM | POA: Diagnosis not present

## 2018-06-09 DIAGNOSIS — E7800 Pure hypercholesterolemia, unspecified: Secondary | ICD-10-CM

## 2018-06-09 DIAGNOSIS — E119 Type 2 diabetes mellitus without complications: Secondary | ICD-10-CM | POA: Diagnosis not present

## 2018-06-09 DIAGNOSIS — R809 Proteinuria, unspecified: Secondary | ICD-10-CM | POA: Diagnosis not present

## 2018-06-09 DIAGNOSIS — R932 Abnormal findings on diagnostic imaging of liver and biliary tract: Secondary | ICD-10-CM | POA: Diagnosis not present

## 2018-06-09 DIAGNOSIS — E2839 Other primary ovarian failure: Secondary | ICD-10-CM

## 2018-06-09 DIAGNOSIS — N289 Disorder of kidney and ureter, unspecified: Secondary | ICD-10-CM | POA: Diagnosis not present

## 2018-06-09 DIAGNOSIS — Z0000 Encounter for general adult medical examination without abnormal findings: Secondary | ICD-10-CM | POA: Diagnosis not present

## 2018-06-09 LAB — TSH: TSH: 2.6 u[IU]/mL (ref 0.35–4.50)

## 2018-06-09 LAB — CBC WITH DIFFERENTIAL/PLATELET
Basophils Absolute: 0 10*3/uL (ref 0.0–0.1)
Basophils Relative: 0.8 % (ref 0.0–3.0)
Eosinophils Absolute: 0.5 10*3/uL (ref 0.0–0.7)
Eosinophils Relative: 9.9 % — ABNORMAL HIGH (ref 0.0–5.0)
HCT: 34.9 % — ABNORMAL LOW (ref 36.0–46.0)
Hemoglobin: 11.4 g/dL — ABNORMAL LOW (ref 12.0–15.0)
Lymphocytes Relative: 33 % (ref 12.0–46.0)
Lymphs Abs: 1.6 10*3/uL (ref 0.7–4.0)
MCHC: 32.6 g/dL (ref 30.0–36.0)
MCV: 96.9 fl (ref 78.0–100.0)
Monocytes Absolute: 0.4 10*3/uL (ref 0.1–1.0)
Monocytes Relative: 7.4 % (ref 3.0–12.0)
Neutro Abs: 2.4 10*3/uL (ref 1.4–7.7)
Neutrophils Relative %: 48.9 % (ref 43.0–77.0)
Platelets: 281 10*3/uL (ref 150.0–400.0)
RBC: 3.61 Mil/uL — ABNORMAL LOW (ref 3.87–5.11)
RDW: 13.9 % (ref 11.5–15.5)
WBC: 4.9 10*3/uL (ref 4.0–10.5)

## 2018-06-09 LAB — LIPID PANEL
Cholesterol: 121 mg/dL (ref 0–200)
HDL: 33.9 mg/dL — ABNORMAL LOW (ref >39.00)
LDL Cholesterol: 54 mg/dL (ref 0–99)
NONHDL: 86.88
Total CHOL/HDL Ratio: 4
Triglycerides: 164 mg/dL — ABNORMAL HIGH (ref 0.0–149.0)
VLDL: 32.8 mg/dL (ref 0.0–40.0)

## 2018-06-09 LAB — HEPATIC FUNCTION PANEL
ALT: 41 U/L — ABNORMAL HIGH (ref 0–35)
AST: 29 U/L (ref 0–37)
Albumin: 3.4 g/dL — ABNORMAL LOW (ref 3.5–5.2)
Alkaline Phosphatase: 255 U/L — ABNORMAL HIGH (ref 39–117)
Bilirubin, Direct: 0.5 mg/dL — ABNORMAL HIGH (ref 0.0–0.3)
Total Bilirubin: 1 mg/dL (ref 0.2–1.2)
Total Protein: 6 g/dL (ref 6.0–8.3)

## 2018-06-09 MED ORDER — GABAPENTIN 300 MG PO CAPS: 300.0000 mg | ORAL_CAPSULE | Freq: Three times a day (TID) | ORAL | 3 refills | Status: DC

## 2018-06-09 MED ORDER — PANTOPRAZOLE SODIUM 40 MG PO TBEC: 40.0000 mg | DELAYED_RELEASE_TABLET | Freq: Every day | ORAL | 3 refills | Status: DC

## 2018-06-09 NOTE — Assessment & Plan Note (Signed)
This am at [facility] Cr stable at 1  K 5.2- also stable  Enc good fluid intake

## 2018-06-09 NOTE — Assessment & Plan Note (Signed)
Reviewed health habits including diet and exercise and skin cancer prevention Reviewed appropriate screening tests for age  Also reviewed health mt list, fam hx and immunization status , as well as social and family history   See HPI Labs rev  Adv directive done Hearing is fairly good Eye exam utd  Enc low impact exercise  Ref done for f/u dexa  Pt will complete cologuard screening test

## 2018-06-09 NOTE — Progress Notes (Signed)
Subjective:    Patient ID: Morgan Hubbard, female    DOB: 03/12/1935, 84 y.o.   MRN: [id number]  HPI  Here for AMW and health mt exam  Feeling much better from the flu   Planning a [place] trip in April  Wt Readings from Last 3 Encounters:  06/09/18 141 lb 8 oz (64.2 kg)  05/26/18 142 lb (64.4 kg)  05/18/18 142 lb 8 oz (64.6 kg)   27.18 kg/m   I have personally reviewed the Medicare Annual Wellness questionnaire and have noted 1. The patient's medical and social history 2. Their use of alcohol, tobacco or illicit drugs 3. Their current medications and supplements 4. The patient's functional ability including ADL's, fall risks, home safety risks and hearing or visual             impairment. 5. Diet and physical activities 6. Evidence for depression or mood disorders  The patients weight, height, BMI have been recorded in the chart and visual acuity is per eye clinic.  I have made referrals, counseling and provided education to the patient based review of the above and I have provided the pt with a written personalized care plan for preventive services. Reviewed and updated provider list, see scanned forms.  See scanned forms.  Routine anticipatory guidance given to patient.  See health maintenance. Colon cancer screening last colonoscopy 2012 cologuard- still has the box and will do it  Breast cancer screening  Mammogram 3/17  Had MRI instead of mammogram this past year due to personal hx of breast cancer (on femara)  F/u is fine- sees oncology  Sister also had breast cancer  Has one planned 5/20 at [facility] exam 8/19  Self breast exam-no lumps  Flu vaccine 10/19    Has also had a bad case of the flu  Tetanus vaccine -medicare does not cover  Pneumovax-up to date  Zoster vaccine-not interested (she had shingles in the past) dexa 1/18 -osteopenia forearm and hip  Falls-none Fractures-none  Supplements - takes D but no calcium  Exercise - none / she has a bicycle to  use (not motivated)  She did cardio-pulm rehab in the past  Advance directive- she has a living will and POA (2 kids are POA) Cognitive function addressed- see scanned forms- and if abnormal then additional documentation follows.  No changes in memory that concern her  occ looses things   PMH and SH reviewed  Meds, vitals, and allergies reviewed.   ROS: See HPI.  Otherwise negative.     Hearing Screening   125Hz  250Hz  500Hz  1000Hz  2000Hz  3000Hz  4000Hz  6000Hz  8000Hz   Right ear:   40 40 40  0    Left ear:   40 40 40  40    Vision Screening Comments: Pt had eye exam in Spring of 2019 at [facility]    bp is stable today  No cp or palpitations or headaches or edema  No side effects to medicines  BP Readings from Last 3 Encounters:  06/09/18 134/60  05/26/18 108/68  05/18/18 (!) 146/78     H/o non ischemic cardiomyopathy   Had chem panel today at [facility] K 5.2- baseline  GFR 53 Cr 1.0   fatty liver Lab Results  Component Value Date   ALT 21 05/02/2017   AST 23 05/02/2017   ALKPHOS 73 05/02/2017   BILITOT 0.5 05/02/2017    DM2  Sees endocrinology  A1C was 6.7 today at [facility]  done 6/19 -ratio was elevated   H/o goiter in the past Sees endocrinology   Hyperlipidemia Lab Results  Component Value Date   CHOL 122 05/02/2017   HDL 32.90 (L) 05/02/2017   LDLCALC 57 05/02/2017   LDLDIRECT 77.0 04/22/2016   TRIG 161.0 (H) 05/02/2017   CHOLHDL 4 05/02/2017   Statin and diet  Diet is good /she really tries to eat low fat and low sugar   Patient Active Problem List   Diagnosis Date Noted  . Productive cough 05/13/2018  . Influenza A 05/13/2018  . Tingling 10/06/2017  . Chronic back pain 05/23/2017  . Chronic systolic CHF (congestive heart failure) ([place]) 02/25/2017  . Osteopenia 06/16/2016  . Routine general medical examination at a health care facility 04/24/2016  . Estrogen deficiency 04/24/2016  . Urticaria 11/03/2015  . Poorly controlled type 2  diabetes mellitus ([place]) 06/21/2015  . Cerumen impaction 03/10/2015  . Encounter for Medicare annual wellness exam 06/13/2014  . Stroke, small vessel ([place]) 01/18/2014  . Family history of hemochromatosis 01/18/2014  . Cerebral thrombosis with cerebral infarction ([place]) 01/14/2014  . Expressive aphasia 01/13/2014  . Nonischemic cardiomyopathy ([place]) 04/05/2013  . Shortness of breath 09/03/2012  . Gallstones 05/26/2012  . Elevated transaminase level 05/18/2012  . Spinal stenosis of lumbar region 12/10/2011  . Mixed incontinence urge and stress 12/10/2011  . Renal insufficiency 11/04/2011  . Goiter 01/14/2011  . Fatty liver 08/28/2010  . DM (diabetes mellitus), type 2 ([place]) 07/09/2010  . HYPERCHOLESTEROLEMIA 07/09/2010  . HYPERKALEMIA 07/09/2010  . REFLEX SYMPATHETIC DYSTROPHY 07/09/2010  . Neuropathy 07/09/2010  . Essential hypertension 07/09/2010  . CARDIOMYOPATHY 07/09/2010  . OVERACTIVE BLADDER 07/09/2010   Past Medical History:  Diagnosis Date  . Arthritis    "hands" (01/13/2014)  . Basal cell carcinoma 01/2014   "bridge of nose"  . Cardiac LV ejection fraction >40%    "it was 43 last year" (01/13/2014)  . Chronic lower back pain   . Colon polyps   . Expressive aphasia    "3 times in the last week" (01/13/2014)  . GERD (gastroesophageal reflux disease)   . Goiter past remote   treated with RI  . Hyperlipidemia   . Hyperpotassemia   . Hypertension   . Hypertonicity of bladder   . Inflammatory and toxic neuropathy, unspecified   . LBBB (left bundle branch block)   . Migraine    "stopped many years ago" (01/13/2014)  . Mini stroke ([place]) 01/2014  . Other primary cardiomyopathies   . Overactive bladder   . Presence of combination internal cardiac defibrillator (ICD) and pacemaker   . Reflex sympathetic dystrophy, unspecified   . Shingles   . Shortness of breath    ambulation  . Stroke ([place])   . Thyroid disease   . Type II diabetes mellitus ([place])   . Ulcer   . Urine  incontinence   . Vertigo    hx of   Past Surgical History:  Procedure Laterality Date  . BACK SURGERY    . BI-VENTRICULAR PACEMAKER INSERTION (CRT-P)  10/2014   [facility]  . BREAST CYST EXCISION Left 1959  . CARDIAC CATHETERIZATION  "several"   [facility]  . CATARACT EXTRACTION W/ INTRAOCULAR LENS IMPLANT Left ~ 2005   several eye injections  . COLONOSCOPY  7/12   normal (hx of polyps in past)   . CT SCAN  3/12   outside hosp- lung nodule and gallstones  . [place]   "knot on  index"  . KIDNEY DONATION Left 1989  . LUMBAR LAMINECTOMY/DECOMPRESSION MICRODISCECTOMY  1999  . LUMBAR LAMINECTOMY/DECOMPRESSION MICRODISCECTOMY  01/31/2012   Procedure: LUMBAR LAMINECTOMY/DECOMPRESSION MICRODISCECTOMY 2 LEVELS;  Surgeon: Eustace Moore, MD;  Location: [facility];  Service: Neurosurgery;  Laterality: N/A;  Thoracic twelve-lumbar one, lumbar one-two laminectomy   . POSTERIOR LAMINECTOMY / DECOMPRESSION CERVICAL SPINE  1999  . TUBAL LIGATION  1976  . UPPER GASTROINTESTINAL ENDOSCOPY     Social History   Tobacco Use  . Smoking status: Never Smoker  . Smokeless tobacco: Never Used  Substance Use Topics  . Alcohol use: No    Alcohol/week: 0.0 standard drinks  . Drug use: No   Family History  Problem Relation Age of Onset  . Arthritis Mother   . Cancer Mother        uterine and mouth  . Hyperlipidemia Mother   . Stroke Mother   . Hypertension Mother   . Alcohol abuse Father   . Diabetes Father   . Cancer Sister        breast  . Diabetes Sister   . Cancer Brother        lung cancer  . Kidney disease Brother   . Diabetes Brother   . Hyperlipidemia Sister   . Heart disease Sister   . Hypertension Sister   . Diabetes Sister   . Hyperlipidemia Brother   . Kidney failure Brother   . Diabetes Daughter   . Addison's disease Other    Allergies  Allergen Reactions  . Ace Inhibitors Swelling    REACTION: tongue swelling  . Codeine Other (See Comments)     REACTION: nausea  . Hydrochlorothiazide   . Insulin Detemir Itching  . Lisinopril Swelling    Swelling of tongue  . Nsaids Other (See Comments)    Unable to take D/T kidney issues  . Tylenol [Acetaminophen]    Current Outpatient Medications on File Prior to Visit  Medication Sig Dispense Refill  . albuterol (PROVENTIL HFA;VENTOLIN HFA) 108 (90 Base) MCG/ACT inhaler Inhale 2 puffs into the lungs every 4 (four) hours as needed for wheezing. 1 Inhaler 0  . aspirin 325 MG tablet Take 325 mg by mouth daily.    Marland Kitchen atorvastatin (LIPITOR) 80 MG tablet Take 80 mg by mouth daily.    . benzonatate (TESSALON) 200 MG capsule Take 1 capsule (200 mg total) by mouth 3 (three) times daily as needed. Do not bite pill 30 capsule 1  . carvedilol (COREG) 12.5 MG tablet Take 1 tablet (12.5 mg total) by mouth 2 (two) times daily with a meal. 180 tablet 4  . cetirizine (ZYRTEC) 10 MG tablet Take 10 mg by mouth 2 (two) times daily.    . Cholecalciferol (VITAMIN D-3) 1000 units CAPS Take 1 capsule by mouth daily.    . clotrimazole-betamethasone (LOTRISONE) cream Apply 1 application topically as needed.    . dapsone 25 MG tablet Take 50 mg by mouth daily.    . Glimepiride (AMARYL PO) Take 2 mg by mouth daily.     Marland Kitchen glucose blood (ONE TOUCH ULTRA TEST) test strip Use 3 (three) times daily.    . hydrALAZINE (APRESOLINE) 50 MG tablet Take 1 tablet by mouth 2 (two) times daily.    . hydrochlorothiazide (MICROZIDE) 12.5 MG capsule Take 12.5 mg by mouth daily.    . isosorbide mononitrate (IMDUR) 30 MG 24 hr tablet Take 1 tablet (30 mg total) by mouth daily. 90 tablet 4  . letrozole (  FEMARA) 2.5 MG tablet Take 2.5 mg by mouth daily.    Marland Kitchen losartan (COZAAR) 50 MG tablet Take 50 mg by mouth daily.    . metFORMIN (GLUCOPHAGE-XR) 500 MG 24 hr tablet Take 1,000 mg by mouth 2 (two) times daily.    Marland Kitchen MILK THISTLE PO Take 1 capsule by mouth 2 (two) times daily.     . nitroGLYCERIN (NITROSTAT) 0.4 MG SL tablet Place 0.4 mg under  the tongue every 5 (five) minutes as needed for chest pain.    . promethazine-dextromethorphan (PROMETHAZINE-DM) 6.25-15 MG/5ML syrup TAKE 5 ML BY MOUTH 4 TIMES DAILY AS NEEDED FOR COUGH (CAUTION  SEDATION) 118 mL 0  . traMADol (ULTRAM) 50 MG tablet Take 1-2 tablets (50-100 mg total) by mouth every 8 (eight) hours as needed for moderate pain or severe pain. 60 tablet 0  . vitamin B-12 (CYANOCOBALAMIN) 1000 MCG tablet Take 1,000 mcg by mouth daily.      No current facility-administered medications on file prior to visit.      Review of Systems  Constitutional: Negative for activity change, appetite change, fatigue, fever and unexpected weight change.  HENT: Negative for congestion, ear pain, rhinorrhea, sinus pressure and sore throat.   Eyes: Negative for pain, redness and visual disturbance.  Respiratory: Positive for cough. Negative for shortness of breath and wheezing.        Cough is much improved   Cardiovascular: Negative for chest pain and palpitations.  Gastrointestinal: Negative for abdominal pain, blood in stool, constipation and diarrhea.  Endocrine: Negative for polydipsia and polyuria.  Genitourinary: Negative for dysuria, frequency and urgency.  Musculoskeletal: Positive for back pain. Negative for arthralgias and myalgias.  Skin: Negative for pallor and rash.  Allergic/Immunologic: Negative for environmental allergies.  Neurological: Negative for dizziness, tremors, seizures, syncope, facial asymmetry, speech difficulty, weakness, light-headedness, numbness and headaches.  Hematological: Negative for adenopathy. Does not bruise/bleed easily.  Psychiatric/Behavioral: Negative for decreased concentration and dysphoric mood. The patient is not nervous/anxious.        Objective:   Physical Exam Constitutional:      General: She is not in acute distress.    Appearance: She is well-developed. She is not ill-appearing.     Comments: overwt   HENT:     Head: Normocephalic and  atraumatic.     Right Ear: Tympanic membrane, ear canal and external ear normal.     Left Ear: Tympanic membrane, ear canal and external ear normal.     Nose: Nose normal.     Mouth/Throat:     Mouth: Mucous membranes are moist.     Pharynx: Oropharynx is clear.  Eyes:     General: No scleral icterus.    Conjunctiva/sclera: Conjunctivae normal.     Pupils: Pupils are equal, round, and reactive to light.  Neck:     Musculoskeletal: Normal range of motion and neck supple.     Thyroid: No thyromegaly.     Vascular: No carotid bruit or JVD.  Cardiovascular:     Rate and Rhythm: Normal rate and regular rhythm.     Heart sounds: Normal heart sounds. No gallop.   Pulmonary:     Effort: Pulmonary effort is normal. No respiratory distress.     Breath sounds: Normal breath sounds. No stridor. No wheezing.     Comments: Good air exch Chest:     Chest wall: No tenderness.  Abdominal:     General: Bowel sounds are normal. There is no distension or abdominal bruit.  Palpations: Abdomen is soft. There is no mass.     Tenderness: There is no abdominal tenderness.  Genitourinary:    Comments: Breast exam: No mass, nodules, thickening, tenderness, bulging, retraction, inflamation, nipple discharge or skin changes noted.  No axillary or clavicular LA.     Surgical changes noted Musculoskeletal: Normal range of motion.        General: No swelling or tenderness.     Right lower leg: No edema.     Left lower leg: No edema.     Comments: Mild kyphosis   Lymphadenopathy:     Cervical: No cervical adenopathy.  Skin:    General: Skin is warm and dry.     Coloration: Skin is not pale.     Findings: No erythema or rash.  Neurological:     General: No focal deficit present.     Mental Status: She is alert. Mental status is at baseline.     Cranial Nerves: No cranial nerve deficit.     Motor: No abnormal muscle tone.     Coordination: Coordination normal.     Deep Tendon Reflexes: Reflexes are  normal and symmetric. Reflexes normal.  Psychiatric:        Mood and Affect: Mood normal.           Assessment & Plan:   Problem List Items Addressed This Visit      Cardiovascular and Mediastinum   Nonischemic cardiomyopathy ([place])    Doing well  No clinical changes      Essential hypertension    bp in fair control at this time  BP Readings from Last 1 Encounters:  06/09/18 134/60   No changes needed Most recent labs reviewed  Disc lifstyle change with low sodium diet and exercise        Relevant Orders   CBC with Differential/Platelet (Completed)   Lipid panel (Completed)   TSH (Completed)   Hepatic function panel (Completed)     Endocrine   RESOLVED: Poorly controlled type 2 diabetes mellitus ([place])   DM (diabetes mellitus), type 2 ([place])    F/u planned with Dr Gabriel Carina Managed by endocrinology A1C was 6.7 this am at [facility] and eye care discussed  microalb utd from june        Musculoskeletal and Integument   Osteopenia    On femara  Due for 2 y dexa -ref done  Disc need for calcium/ vitamin D/ wt bearing exercise and bone density test every 2 y to monitor Disc safety/ fracture risk in detail          Genitourinary   Renal insufficiency    This am at [facility] Cr stable at 1  K 5.2- also stable  Enc good fluid intake         Other   Routine general medical examination at a health care facility    Reviewed health habits including diet and exercise and skin cancer prevention Reviewed appropriate screening tests for age  Also reviewed health mt list, fam hx and immunization status , as well as social and family history   See HPI Labs reviewed from [facility] this am  Other labs drawn today  dexa ordered  Adv directive done  Hearing is fairly good and utd eye exam  Strongly enc low impact exercise as tolerated       HYPERCHOLESTEROLEMIA    Disc goals for lipids and reasons to control them Rev last labs with pt Rev low sat fat diet in  detail  Labs  today  Continue atorvastatin       Relevant Orders   Lipid panel (Completed)   Hepatic function panel (Completed)   Estrogen deficiency   Relevant Orders   DG Bone Density   Encounter for Medicare annual wellness exam - Primary    Reviewed health habits including diet and exercise and skin cancer prevention Reviewed appropriate screening tests for age  Also reviewed health mt list, fam hx and immunization status , as well as social and family history   See HPI Labs rev  Adv directive done Hearing is fairly good Eye exam utd  Enc low impact exercise  Ref done for f/u dexa  Pt will complete cologuard screening test

## 2018-06-09 NOTE — Patient Instructions (Addendum)
Once you feel totally better please get back on the bike for exercise  This is important for mental and physical health  You can also look for a water program or go to gym with silver sneakers   Labs today   Stop up front to schedule your bone density test  Take care of yourself

## 2018-06-09 NOTE — Assessment & Plan Note (Signed)
On femara  Due for 2 y dexa -ref done  Disc need for calcium/ vitamin D/ wt bearing exercise and bone density test every 2 y to monitor Disc safety/ fracture risk in detail

## 2018-06-09 NOTE — Assessment & Plan Note (Signed)
Doing well  No clinical changes  

## 2018-06-09 NOTE — Assessment & Plan Note (Signed)
F/u planned with Dr Gabriel Carina Managed by endocrinology A1C was 6.7 this am at [facility] and eye care discussed  microalb utd from june

## 2018-06-09 NOTE — Assessment & Plan Note (Signed)
Reviewed health habits including diet and exercise and skin cancer prevention Reviewed appropriate screening tests for age  Also reviewed health mt list, fam hx and immunization status , as well as social and family history   See HPI Labs reviewed from [facility] this am  Other labs drawn today  dexa ordered  Adv directive done  Hearing is fairly good and utd eye exam  Strongly enc low impact exercise as tolerated

## 2018-06-09 NOTE — Assessment & Plan Note (Signed)
Disc goals for lipids and reasons to control them Rev last labs with pt Rev low sat fat diet in detail  Labs today  Continue atorvastatin

## 2018-06-09 NOTE — Assessment & Plan Note (Signed)
bp in fair control at this time  BP Readings from Last 1 Encounters:  06/09/18 134/60   No changes needed Most recent labs reviewed  Disc lifstyle change with low sodium diet and exercise

## 2018-06-10 ENCOUNTER — Emergency Department (HOSPITAL_COMMUNITY): Payer: Medicare HMO

## 2018-06-10 ENCOUNTER — Inpatient Hospital Stay (HOSPITAL_COMMUNITY)
Admission: EM | Admit: 2018-06-10 | Discharge: 2018-06-15 | DRG: 853 | Disposition: A | Discharge status: Home / Self Care | Payer: Medicare HMO | Attending: Internal Medicine | Admitting: Internal Medicine

## 2018-06-10 ENCOUNTER — Ambulatory Visit: Payer: Medicare HMO | Admitting: Family Medicine

## 2018-06-10 ENCOUNTER — Encounter (HOSPITAL_COMMUNITY): Payer: Self-pay | Admitting: Emergency Medicine

## 2018-06-10 ENCOUNTER — Telehealth: Payer: Self-pay

## 2018-06-10 ENCOUNTER — Other Ambulatory Visit: Payer: Self-pay | Admitting: *Deleted

## 2018-06-10 ENCOUNTER — Other Ambulatory Visit: Payer: Self-pay

## 2018-06-10 DIAGNOSIS — Z808 Family history of malignant neoplasm of other organs or systems: Secondary | ICD-10-CM

## 2018-06-10 DIAGNOSIS — R945 Abnormal results of liver function studies: Secondary | ICD-10-CM

## 2018-06-10 DIAGNOSIS — R109 Unspecified abdominal pain: Secondary | ICD-10-CM | POA: Diagnosis present

## 2018-06-10 DIAGNOSIS — Z885 Allergy status to narcotic agent status: Secondary | ICD-10-CM | POA: Diagnosis not present

## 2018-06-10 DIAGNOSIS — I5022 Chronic systolic (congestive) heart failure: Secondary | ICD-10-CM | POA: Diagnosis not present

## 2018-06-10 DIAGNOSIS — Z905 Acquired absence of kidney: Secondary | ICD-10-CM

## 2018-06-10 DIAGNOSIS — Z961 Presence of intraocular lens: Secondary | ICD-10-CM | POA: Diagnosis present

## 2018-06-10 DIAGNOSIS — Z801 Family history of malignant neoplasm of trachea, bronchus and lung: Secondary | ICD-10-CM

## 2018-06-10 DIAGNOSIS — K8020 Calculus of gallbladder without cholecystitis without obstruction: Secondary | ICD-10-CM | POA: Diagnosis present

## 2018-06-10 DIAGNOSIS — G8929 Other chronic pain: Secondary | ICD-10-CM | POA: Diagnosis present

## 2018-06-10 DIAGNOSIS — I361 Nonrheumatic tricuspid (valve) insufficiency: Secondary | ICD-10-CM | POA: Diagnosis not present

## 2018-06-10 DIAGNOSIS — M19041 Primary osteoarthritis, right hand: Secondary | ICD-10-CM | POA: Diagnosis present

## 2018-06-10 DIAGNOSIS — Z823 Family history of stroke: Secondary | ICD-10-CM

## 2018-06-10 DIAGNOSIS — B952 Enterococcus as the cause of diseases classified elsewhere: Secondary | ICD-10-CM | POA: Diagnosis present

## 2018-06-10 DIAGNOSIS — Z8673 Personal history of transient ischemic attack (TIA), and cerebral infarction without residual deficits: Secondary | ICD-10-CM | POA: Diagnosis present

## 2018-06-10 DIAGNOSIS — Z95810 Presence of automatic (implantable) cardiac defibrillator: Secondary | ICD-10-CM | POA: Diagnosis present

## 2018-06-10 DIAGNOSIS — Z8261 Family history of arthritis: Secondary | ICD-10-CM

## 2018-06-10 DIAGNOSIS — Z9851 Tubal ligation status: Secondary | ICD-10-CM | POA: Diagnosis not present

## 2018-06-10 DIAGNOSIS — Z8701 Personal history of pneumonia (recurrent): Secondary | ICD-10-CM

## 2018-06-10 DIAGNOSIS — Z79899 Other long term (current) drug therapy: Secondary | ICD-10-CM | POA: Diagnosis not present

## 2018-06-10 DIAGNOSIS — R51 Headache: Secondary | ICD-10-CM | POA: Diagnosis not present

## 2018-06-10 DIAGNOSIS — E7800 Pure hypercholesterolemia, unspecified: Secondary | ICD-10-CM | POA: Diagnosis present

## 2018-06-10 DIAGNOSIS — M19042 Primary osteoarthritis, left hand: Secondary | ICD-10-CM | POA: Diagnosis present

## 2018-06-10 DIAGNOSIS — K819 Cholecystitis, unspecified: Secondary | ICD-10-CM

## 2018-06-10 DIAGNOSIS — Z853 Personal history of malignant neoplasm of breast: Secondary | ICD-10-CM

## 2018-06-10 DIAGNOSIS — K219 Gastro-esophageal reflux disease without esophagitis: Secondary | ICD-10-CM | POA: Diagnosis present

## 2018-06-10 DIAGNOSIS — M545 Low back pain: Secondary | ICD-10-CM | POA: Diagnosis not present

## 2018-06-10 DIAGNOSIS — I130 Hypertensive heart and chronic kidney disease with heart failure and stage 1 through stage 4 chronic kidney disease, or unspecified chronic kidney disease: Secondary | ICD-10-CM | POA: Diagnosis not present

## 2018-06-10 DIAGNOSIS — N183 Chronic kidney disease, stage 3 unspecified: Secondary | ICD-10-CM | POA: Diagnosis present

## 2018-06-10 DIAGNOSIS — K8067 Calculus of gallbladder and bile duct with acute and chronic cholecystitis with obstruction: Secondary | ICD-10-CM | POA: Diagnosis present

## 2018-06-10 DIAGNOSIS — E1129 Type 2 diabetes mellitus with other diabetic kidney complication: Secondary | ICD-10-CM | POA: Diagnosis present

## 2018-06-10 DIAGNOSIS — I428 Other cardiomyopathies: Secondary | ICD-10-CM | POA: Diagnosis present

## 2018-06-10 DIAGNOSIS — D631 Anemia in chronic kidney disease: Secondary | ICD-10-CM | POA: Diagnosis present

## 2018-06-10 DIAGNOSIS — Z800 Family history of malignant neoplasm of digestive organs: Secondary | ICD-10-CM

## 2018-06-10 DIAGNOSIS — R7989 Other specified abnormal findings of blood chemistry: Secondary | ICD-10-CM | POA: Diagnosis present

## 2018-06-10 DIAGNOSIS — I5042 Chronic combined systolic (congestive) and diastolic (congestive) heart failure: Secondary | ICD-10-CM | POA: Diagnosis not present

## 2018-06-10 DIAGNOSIS — Z85828 Personal history of other malignant neoplasm of skin: Secondary | ICD-10-CM

## 2018-06-10 DIAGNOSIS — K8051 Calculus of bile duct without cholangitis or cholecystitis with obstruction: Secondary | ICD-10-CM | POA: Diagnosis not present

## 2018-06-10 DIAGNOSIS — R748 Abnormal levels of other serum enzymes: Secondary | ICD-10-CM | POA: Diagnosis not present

## 2018-06-10 DIAGNOSIS — K8080 Other cholelithiasis without obstruction: Secondary | ICD-10-CM | POA: Diagnosis not present

## 2018-06-10 DIAGNOSIS — A4181 Sepsis due to Enterococcus: Secondary | ICD-10-CM | POA: Diagnosis not present

## 2018-06-10 DIAGNOSIS — K8050 Calculus of bile duct without cholangitis or cholecystitis without obstruction: Secondary | ICD-10-CM | POA: Diagnosis not present

## 2018-06-10 DIAGNOSIS — E785 Hyperlipidemia, unspecified: Secondary | ICD-10-CM | POA: Diagnosis present

## 2018-06-10 DIAGNOSIS — Z86010 Personal history of colonic polyps: Secondary | ICD-10-CM | POA: Diagnosis not present

## 2018-06-10 DIAGNOSIS — R1011 Right upper quadrant pain: Secondary | ICD-10-CM | POA: Diagnosis not present

## 2018-06-10 DIAGNOSIS — R1084 Generalized abdominal pain: Secondary | ICD-10-CM | POA: Diagnosis not present

## 2018-06-10 DIAGNOSIS — Z9842 Cataract extraction status, left eye: Secondary | ICD-10-CM

## 2018-06-10 DIAGNOSIS — R7881 Bacteremia: Secondary | ICD-10-CM | POA: Diagnosis present

## 2018-06-10 DIAGNOSIS — Z8349 Family history of other endocrine, nutritional and metabolic diseases: Secondary | ICD-10-CM

## 2018-06-10 DIAGNOSIS — N179 Acute kidney failure, unspecified: Secondary | ICD-10-CM | POA: Diagnosis not present

## 2018-06-10 DIAGNOSIS — I10 Essential (primary) hypertension: Secondary | ICD-10-CM | POA: Diagnosis not present

## 2018-06-10 DIAGNOSIS — Z7982 Long term (current) use of aspirin: Secondary | ICD-10-CM

## 2018-06-10 DIAGNOSIS — Z8249 Family history of ischemic heart disease and other diseases of the circulatory system: Secondary | ICD-10-CM

## 2018-06-10 DIAGNOSIS — Z7984 Long term (current) use of oral hypoglycemic drugs: Secondary | ICD-10-CM | POA: Diagnosis not present

## 2018-06-10 DIAGNOSIS — A419 Sepsis, unspecified organism: Secondary | ICD-10-CM

## 2018-06-10 DIAGNOSIS — R1010 Upper abdominal pain, unspecified: Secondary | ICD-10-CM | POA: Diagnosis not present

## 2018-06-10 DIAGNOSIS — Z841 Family history of disorders of kidney and ureter: Secondary | ICD-10-CM

## 2018-06-10 DIAGNOSIS — Z886 Allergy status to analgesic agent status: Secondary | ICD-10-CM | POA: Diagnosis not present

## 2018-06-10 DIAGNOSIS — R1013 Epigastric pain: Secondary | ICD-10-CM | POA: Diagnosis not present

## 2018-06-10 DIAGNOSIS — I639 Cerebral infarction, unspecified: Secondary | ICD-10-CM | POA: Diagnosis not present

## 2018-06-10 DIAGNOSIS — R112 Nausea with vomiting, unspecified: Secondary | ICD-10-CM | POA: Diagnosis not present

## 2018-06-10 DIAGNOSIS — Z0389 Encounter for observation for other suspected diseases and conditions ruled out: Secondary | ICD-10-CM | POA: Diagnosis not present

## 2018-06-10 DIAGNOSIS — Z888 Allergy status to other drugs, medicaments and biological substances status: Secondary | ICD-10-CM

## 2018-06-10 DIAGNOSIS — K8010 Calculus of gallbladder with chronic cholecystitis without obstruction: Secondary | ICD-10-CM | POA: Diagnosis not present

## 2018-06-10 DIAGNOSIS — R932 Abnormal findings on diagnostic imaging of liver and biliary tract: Secondary | ICD-10-CM | POA: Diagnosis not present

## 2018-06-10 DIAGNOSIS — R6883 Chills (without fever): Secondary | ICD-10-CM | POA: Diagnosis not present

## 2018-06-10 DIAGNOSIS — Z9049 Acquired absence of other specified parts of digestive tract: Secondary | ICD-10-CM

## 2018-06-10 DIAGNOSIS — E1169 Type 2 diabetes mellitus with other specified complication: Secondary | ICD-10-CM | POA: Diagnosis present

## 2018-06-10 DIAGNOSIS — R740 Nonspecific elevation of levels of transaminase and lactic acid dehydrogenase [LDH]: Secondary | ICD-10-CM | POA: Diagnosis not present

## 2018-06-10 HISTORY — DX: Chronic kidney disease, stage 3 unspecified: N18.30

## 2018-06-10 HISTORY — DX: Chronic kidney disease, stage 3 (moderate): N18.3

## 2018-06-10 LAB — CBC WITH DIFFERENTIAL/PLATELET
Abs Immature Granulocytes: 0.02 10*3/uL (ref 0.00–0.07)
Basophils Absolute: 0 10*3/uL (ref 0.0–0.1)
Basophils Relative: 0 %
Eosinophils Absolute: 0.2 10*3/uL (ref 0.0–0.5)
Eosinophils Relative: 4 %
HCT: 35.8 % — ABNORMAL LOW (ref 36.0–46.0)
Hemoglobin: 11 g/dL — ABNORMAL LOW (ref 12.0–15.0)
Immature Granulocytes: 0 %
Lymphocytes Relative: 14 %
Lymphs Abs: 0.9 10*3/uL (ref 0.7–4.0)
MCH: 30.3 pg (ref 26.0–34.0)
MCHC: 30.7 g/dL (ref 30.0–36.0)
MCV: 98.6 fL (ref 80.0–100.0)
Monocytes Absolute: 0.3 10*3/uL (ref 0.1–1.0)
Monocytes Relative: 5 %
Neutro Abs: 4.6 10*3/uL (ref 1.7–7.7)
Neutrophils Relative %: 77 %
Platelets: 259 10*3/uL (ref 150–400)
RBC: 3.63 MIL/uL — ABNORMAL LOW (ref 3.87–5.11)
RDW: 13.1 % (ref 11.5–15.5)
WBC: 6.1 10*3/uL (ref 4.0–10.5)
nRBC: 0 % (ref 0.0–0.2)

## 2018-06-10 LAB — URINALYSIS, ROUTINE W REFLEX MICROSCOPIC
Bacteria, UA: NONE SEEN
Bilirubin Urine: NEGATIVE
Glucose, UA: NEGATIVE mg/dL
Hgb urine dipstick: NEGATIVE
Ketones, ur: NEGATIVE mg/dL
Leukocytes, UA: NEGATIVE
Nitrite: NEGATIVE
Protein, ur: 100 mg/dL — AB
Specific Gravity, Urine: 1.017 (ref 1.005–1.030)
pH: 7 (ref 5.0–8.0)

## 2018-06-10 LAB — BRAIN NATRIURETIC PEPTIDE: B Natriuretic Peptide: 220.2 pg/mL — ABNORMAL HIGH (ref 0.0–100.0)

## 2018-06-10 LAB — COMPREHENSIVE METABOLIC PANEL
ALT: 105 U/L — ABNORMAL HIGH (ref 0–44)
AST: 270 U/L — ABNORMAL HIGH (ref 15–41)
Albumin: 3 g/dL — ABNORMAL LOW (ref 3.5–5.0)
Alkaline Phosphatase: 316 U/L — ABNORMAL HIGH (ref 38–126)
Anion gap: 10 (ref 5–15)
BUN: 11 mg/dL (ref 8–23)
CO2: 29 mmol/L (ref 22–32)
Calcium: 9.1 mg/dL (ref 8.9–10.3)
Chloride: 104 mmol/L (ref 98–111)
Creatinine, Ser: 1.24 mg/dL — ABNORMAL HIGH (ref 0.44–1.00)
GFR calc Af Amer: 47 mL/min — ABNORMAL LOW (ref >60)
GFR calc non Af Amer: 41 mL/min — ABNORMAL LOW (ref >60)
Glucose, Bld: 159 mg/dL — ABNORMAL HIGH (ref 70–99)
Potassium: 3.8 mmol/L (ref 3.5–5.1)
Sodium: 143 mmol/L (ref 135–145)
Total Bilirubin: 3.2 mg/dL — ABNORMAL HIGH (ref 0.3–1.2)
Total Protein: 6.2 g/dL — ABNORMAL LOW (ref 6.5–8.1)

## 2018-06-10 LAB — I-STAT CG4 LACTIC ACID, ED
Lactic Acid, Venous: 2.92 mmol/L (ref 0.5–1.9)
Lactic Acid, Venous: 1.55 mmol/L (ref 0.5–1.9)

## 2018-06-10 LAB — PROCALCITONIN: Procalcitonin: 7.41 ng/mL

## 2018-06-10 LAB — PROTIME-INR
INR: 0.96
Prothrombin Time: 12.6 seconds (ref 11.4–15.2)

## 2018-06-10 LAB — INFLUENZA PANEL BY PCR (TYPE A & B)
Influenza A By PCR: NEGATIVE
Influenza B By PCR: NEGATIVE

## 2018-06-10 LAB — CBG MONITORING, ED: Glucose-Capillary: 100 mg/dL — ABNORMAL HIGH (ref 70–99)

## 2018-06-10 LAB — APTT: aPTT: 27 seconds (ref 24–36)

## 2018-06-10 LAB — LACTIC ACID, PLASMA: Lactic Acid, Venous: 1.7 mmol/L (ref 0.5–1.9)

## 2018-06-10 LAB — TYPE AND SCREEN
ABO/RH(D): O POS
Antibody Screen: NEGATIVE

## 2018-06-10 MED ORDER — ONDANSETRON HCL 4 MG/2ML IJ SOLN: 4.0000 mg | Freq: Four times a day (QID) | INTRAMUSCULAR | Status: DC | PRN

## 2018-06-10 MED ORDER — PANTOPRAZOLE SODIUM 40 MG PO TBEC
40.0000 mg | DELAYED_RELEASE_TABLET | Freq: Every day | ORAL | Status: DC
  Administered 2018-06-11 – 2018-06-15 (×4): 40 mg via ORAL
  Filled 2018-06-10 (×4): qty 1

## 2018-06-10 MED ORDER — HYDRALAZINE HCL 20 MG/ML IJ SOLN
5.0000 mg | INTRAMUSCULAR | Status: DC | PRN
  Administered 2018-06-12 – 2018-06-15 (×3): 5 mg via INTRAVENOUS
  Filled 2018-06-10 (×3): qty 1

## 2018-06-10 MED ORDER — ONDANSETRON HCL 4 MG PO TABS: 4.0000 mg | ORAL_TABLET | Freq: Four times a day (QID) | ORAL | Status: DC | PRN

## 2018-06-10 MED ORDER — CARVEDILOL 12.5 MG PO TABS
12.5000 mg | ORAL_TABLET | Freq: Two times a day (BID) | ORAL | Status: DC
  Administered 2018-06-11 – 2018-06-15 (×9): 12.5 mg via ORAL
  Filled 2018-06-10 (×9): qty 1

## 2018-06-10 MED ORDER — NITROGLYCERIN 0.4 MG SL SUBL: 0.4000 mg | SUBLINGUAL_TABLET | SUBLINGUAL | Status: DC | PRN

## 2018-06-10 MED ORDER — ISOSORBIDE MONONITRATE ER 30 MG PO TB24
30.0000 mg | ORAL_TABLET | Freq: Every day | ORAL | Status: DC
  Administered 2018-06-11 – 2018-06-15 (×5): 30 mg via ORAL
  Filled 2018-06-10 (×6): qty 1

## 2018-06-10 MED ORDER — DAPSONE 25 MG PO TABS
25.0000 mg | ORAL_TABLET | Freq: Every day | ORAL | Status: DC
  Administered 2018-06-11 – 2018-06-15 (×5): 25 mg via ORAL
  Filled 2018-06-10 (×6): qty 1

## 2018-06-10 MED ORDER — ASPIRIN EC 81 MG PO TBEC
162.0000 mg | DELAYED_RELEASE_TABLET | Freq: Four times a day (QID) | ORAL | Status: DC | PRN
  Administered 2018-06-11: 162 mg via ORAL
  Filled 2018-06-10: qty 2

## 2018-06-10 MED ORDER — ONDANSETRON HCL 4 MG/2ML IJ SOLN
4.0000 mg | Freq: Once | INTRAMUSCULAR | Status: DC
  Filled 2018-06-10: qty 2

## 2018-06-10 MED ORDER — SODIUM CHLORIDE 0.9 % IV BOLUS
250.0000 mL | Freq: Once | INTRAVENOUS | Status: AC
  Administered 2018-06-10: 250 mL via INTRAVENOUS

## 2018-06-10 MED ORDER — LORATADINE 10 MG PO TABS
10.0000 mg | ORAL_TABLET | Freq: Every day | ORAL | Status: DC
  Administered 2018-06-11 – 2018-06-15 (×5): 10 mg via ORAL
  Filled 2018-06-10 (×6): qty 1

## 2018-06-10 MED ORDER — METRONIDAZOLE IN NACL 5-0.79 MG/ML-% IV SOLN
500.0000 mg | Freq: Once | INTRAVENOUS | Status: AC
  Administered 2018-06-10: 500 mg via INTRAVENOUS
  Filled 2018-06-10: qty 100

## 2018-06-10 MED ORDER — VITAMIN D 25 MCG (1000 UNIT) PO TABS
1000.0000 [IU] | ORAL_TABLET | Freq: Every day | ORAL | Status: DC
  Administered 2018-06-11 – 2018-06-15 (×4): 1000 [IU] via ORAL
  Filled 2018-06-10 (×4): qty 1

## 2018-06-10 MED ORDER — PIPERACILLIN-TAZOBACTAM 3.375 G IVPB
3.3750 g | Freq: Two times a day (BID) | INTRAVENOUS | Status: DC
  Administered 2018-06-11: 3.375 g via INTRAVENOUS
  Filled 2018-06-10: qty 50

## 2018-06-10 MED ORDER — SENNOSIDES-DOCUSATE SODIUM 8.6-50 MG PO TABS
1.0000 | ORAL_TABLET | Freq: Every evening | ORAL | Status: DC | PRN
  Filled 2018-06-10: qty 1

## 2018-06-10 MED ORDER — VITAMIN B-12 1000 MCG PO TABS
1000.0000 ug | ORAL_TABLET | Freq: Every day | ORAL | Status: DC
  Administered 2018-06-11 – 2018-06-15 (×4): 1000 ug via ORAL
  Filled 2018-06-10 (×4): qty 1

## 2018-06-10 MED ORDER — SODIUM CHLORIDE 0.9 % IV SOLN
2.0000 g | Freq: Once | INTRAVENOUS | Status: AC
  Administered 2018-06-10: 2 g via INTRAVENOUS
  Filled 2018-06-10: qty 2

## 2018-06-10 MED ORDER — DAPSONE 25 MG PO TABS: 25.0000 mg | ORAL_TABLET | Freq: Every day | ORAL | Status: DC

## 2018-06-10 MED ORDER — MORPHINE SULFATE (PF) 4 MG/ML IV SOLN
4.0000 mg | Freq: Once | INTRAVENOUS | Status: DC
  Filled 2018-06-10: qty 1

## 2018-06-10 MED ORDER — TRAMADOL HCL 50 MG PO TABS
50.0000 mg | ORAL_TABLET | Freq: Three times a day (TID) | ORAL | Status: DC | PRN
  Administered 2018-06-10: 50 mg via ORAL
  Administered 2018-06-11: 100 mg via ORAL
  Administered 2018-06-11: 50 mg via ORAL
  Administered 2018-06-12 – 2018-06-13 (×3): 100 mg via ORAL
  Filled 2018-06-10: qty 2
  Filled 2018-06-10 (×2): qty 1
  Filled 2018-06-10 (×3): qty 2

## 2018-06-10 MED ORDER — PROMETHAZINE HCL 6.25 MG/5ML PO SYRP
6.2500 mg | ORAL_SOLUTION | Freq: Four times a day (QID) | ORAL | Status: DC | PRN
  Filled 2018-06-10: qty 5

## 2018-06-10 MED ORDER — INSULIN ASPART 100 UNIT/ML ~~LOC~~ SOLN
0.0000 [IU] | Freq: Every day | SUBCUTANEOUS | Status: DC
  Administered 2018-06-11: 2 [IU] via SUBCUTANEOUS

## 2018-06-10 MED ORDER — ZOLPIDEM TARTRATE 5 MG PO TABS
5.0000 mg | ORAL_TABLET | Freq: Every evening | ORAL | Status: DC | PRN
  Filled 2018-06-10: qty 1

## 2018-06-10 MED ORDER — ALBUTEROL SULFATE (2.5 MG/3ML) 0.083% IN NEBU: 3.0000 mL | INHALATION_SOLUTION | RESPIRATORY_TRACT | Status: DC | PRN

## 2018-06-10 MED ORDER — GABAPENTIN 300 MG PO CAPS
300.0000 mg | ORAL_CAPSULE | Freq: Three times a day (TID) | ORAL | Status: DC
  Administered 2018-06-11 – 2018-06-15 (×12): 300 mg via ORAL
  Filled 2018-06-10 (×12): qty 1

## 2018-06-10 MED ORDER — LETROZOLE 2.5 MG PO TABS
2.5000 mg | ORAL_TABLET | Freq: Every day | ORAL | Status: DC
  Administered 2018-06-11 – 2018-06-15 (×5): 2.5 mg via ORAL
  Filled 2018-06-10 (×5): qty 1

## 2018-06-10 MED ORDER — SODIUM CHLORIDE 0.9 % IV SOLN
INTRAVENOUS | Status: DC
  Administered 2018-06-10: 20:00:00 via INTRAVENOUS

## 2018-06-10 MED ORDER — INSULIN ASPART 100 UNIT/ML ~~LOC~~ SOLN
0.0000 [IU] | Freq: Three times a day (TID) | SUBCUTANEOUS | Status: DC
  Administered 2018-06-11: 3 [IU] via SUBCUTANEOUS

## 2018-06-10 MED ORDER — ACETAMINOPHEN 500 MG PO TABS
1000.0000 mg | ORAL_TABLET | Freq: Once | ORAL | Status: AC
  Administered 2018-06-10: 1000 mg via ORAL
  Filled 2018-06-10: qty 2

## 2018-06-10 MED ORDER — SODIUM CHLORIDE 0.9 % IV SOLN
INTRAVENOUS | Status: DC
  Administered 2018-06-10 – 2018-06-11 (×2): via INTRAVENOUS

## 2018-06-10 NOTE — ED Notes (Signed)
Pt ambulated self efficiently to restroom with no difficulty. Pt returned safely to bedside. 

## 2018-06-10 NOTE — Telephone Encounter (Signed)
error 

## 2018-06-10 NOTE — ED Notes (Signed)
Admitting MD Blaine Hamper at bedside.

## 2018-06-10 NOTE — ED Triage Notes (Signed)
Pt. Took ASA  2, 325mg 

## 2018-06-10 NOTE — ED Notes (Signed)
Patient transported to X-ray 

## 2018-06-10 NOTE — ED Notes (Signed)
Patient transported to Ultrasound 

## 2018-06-10 NOTE — ED Notes (Signed)
Pt's SpO2 noted to drop into the high 80s while resting. Pt placed on 2L [place]. Will continue to monitor.

## 2018-06-10 NOTE — Telephone Encounter (Signed)
Pt walked in to be seen for shaking and BS 126. Pt drank some apple juice on the way to the office. Pt took ASA 325 mg two tabs about 30' ago; pt thought had fever but did not take temp. Now T 99.5 P 113  BP sitting lt arm reg cuff 190/90.Pulse ox 93 %. When opened mouth to take temp mouth extremely dry; pt has not been drinking a lot today. Pt said both TM joints were hurting and then pt began to vomit. No CP. Dr [name] came in room and advised pt should go to ED for possible dehydration and elevated temp.  pts husband said he felt he could drive pt to [facility] ED now. Pt was assisted into truck; and Mr Shingledecker was taking pt to [facility] ED now. I called [facility] and spoke with [facility] in triage with above info. Jenny Reichmann said would see pt when she arrives. FYI to Dr [name].

## 2018-06-10 NOTE — H&P (Addendum)
History and Physical    Morgan Hubbard ZHY:[id number] DOB: 05-20-1935 DOA: 06/10/2018  Referring MD/NP/PA:   PCP: Abner Greenspan, MD   Patient coming from:  The patient is coming from home.  At baseline, pt is independent for most of ADL.        Chief Complaint: Fever, chills, abdominal pain, nausea vomiting  HPI: Morgan Hubbard is a 84 y.o. female with medical history significant of hypertension, hyperlipidemia, diabetes mellitus, stroke, GERD, left bundle blockage, migraine headache, AICD placement, vertigo, solitary kidney, CHF with EF of 15%, breast cancer (lumpectomy), who presents with fever, chills, abdominal pain, nausea and vomiting.  Patient states that her symptoms started yesterday, including fever, chills, abdominal pain, nausea vomiting.  Her abdominal pain is located in the upper abdomen, constant, mild, 3 out of 10 severity, sharp, nonradiating.  It is associated with nausea and vomiting.  She has had nonbilious, nonbloody vomitus at least 5 times.  No diarrhea. She had chronic mild shortness of breath due to CHF, which has not changed.  No chest pain, cough.  She has body aches, but no runny nose or sore throat.  Denies symptoms of UTI or unilateral weakness.  Of note, patient states that she cannot take Tylenol or NSAIDs.  She takes aspirin for fever in the past.  ED Course: pt was found to have WBC 6.1, lactic acid 2.93, negative urinalysis, negative flu PCR, worsening renal function, abnormal liver function (ALP 316, AST 270, ALT 105, total bilirubin 3.2), temperature 103.2, heart rate 80s, tachypnea, oxygen saturation 89 to 96% on room air, negative chest x-ray. US showed gallstone, gallbladder wall thickening and common bile duct dilation to 10 mm.  Patient is admitted to telemetry bed as inpatient.  General surgeon, Dr. Patterson Hammersmith was consulted by EDP.  US-RUQ: 1. Slightly thickened appearance of the gallbladder without significant distention nor pericholecystic fluid. Intraluminal  biliary sludge and gallstones are identified. Findings may be secondary to a chronic cholecystitis. HIDA scan may help for further assessment. 2. Dilated common bowel duct to 10 mm without choledocholithiasis. There is a slightly above expected for age. Recently passed stone may contribute to this appearance.  Review of Systems:   General: no fevers, chills, no body weight gain, has poor appetite, has fatigue HEENT: no blurry vision, hearing changes or sore throat Respiratory: has dyspnea, coughing, wheezing CV: no chest pain, no palpitations GI: has nausea, vomiting, abdominal pain, no diarrhea, constipation GU: no dysuria, burning on urination, increased urinary frequency, hematuria  Ext: no leg edema Neuro: no unilateral weakness, numbness, or tingling, no vision change or hearing loss Skin: no rash, no skin tear. MSK: No muscle spasm, no deformity, no limitation of range of movement in spin Heme: No easy bruising.  Travel history: No recent long distant travel.  Allergy:  Allergies  Allergen Reactions  . Ace Inhibitors Swelling    REACTION: tongue swelling  . Codeine Other (See Comments)    REACTION: nausea  . Hydrochlorothiazide Other (See Comments)    unknown  . Insulin Detemir Itching  . Lisinopril Swelling    Swelling of tongue  . Nsaids Other (See Comments)    Unable to take D/T kidney issues  . Tylenol [Acetaminophen] Other (See Comments)    Pt has one kidney and says she can't take it    Past Medical History:  Diagnosis Date  . Arthritis    "hands" (01/13/2014)  . Basal cell carcinoma 01/2014   "bridge of nose"  . Cardiac  LV ejection fraction >40%    "it was 43 last year" (01/13/2014)  . Chronic lower back pain   . Colon polyps   . Expressive aphasia    "3 times in the last week" (01/13/2014)  . GERD (gastroesophageal reflux disease)   . Goiter past remote   treated with RI  . Hyperlipidemia   . Hyperpotassemia   . Hypertension   . Hypertonicity of bladder    . Inflammatory and toxic neuropathy, unspecified   . LBBB (left bundle branch block)   . Migraine    "stopped many years ago" (01/13/2014)  . Mini stroke ([place]) 01/2014  . Other primary cardiomyopathies   . Overactive bladder   . Presence of combination internal cardiac defibrillator (ICD) and pacemaker   . Reflex sympathetic dystrophy, unspecified   . Shingles   . Shortness of breath    ambulation  . Stroke ([place])   . Thyroid disease   . Type II diabetes mellitus ([place])   . Ulcer   . Urine incontinence   . Vertigo    hx of    Past Surgical History:  Procedure Laterality Date  . BACK SURGERY    . BI-VENTRICULAR PACEMAKER INSERTION (CRT-P)  10/2014   [facility]  . BREAST CYST EXCISION Left 1959  . CARDIAC CATHETERIZATION  "several"   [place]  . CATARACT EXTRACTION W/ INTRAOCULAR LENS IMPLANT Left ~ 2005   several eye injections  . COLONOSCOPY  7/12   normal (hx of polyps in past)   . CT SCAN  3/12   outside hosp- lung nodule and gallstones  . [place]   "knot on index"  . KIDNEY DONATION Left 1989  . LUMBAR LAMINECTOMY/DECOMPRESSION MICRODISCECTOMY  1999  . LUMBAR LAMINECTOMY/DECOMPRESSION MICRODISCECTOMY  01/31/2012   Procedure: LUMBAR LAMINECTOMY/DECOMPRESSION MICRODISCECTOMY 2 LEVELS;  Surgeon: Eustace Moore, MD;  Location: [facility];  Service: Neurosurgery;  Laterality: N/A;  Thoracic twelve-lumbar one, lumbar one-two laminectomy   . POSTERIOR LAMINECTOMY / DECOMPRESSION CERVICAL SPINE  1999  . TUBAL LIGATION  1976  . UPPER GASTROINTESTINAL ENDOSCOPY      Social History:  reports that she has never smoked. She has never used smokeless tobacco. She reports that she does not drink alcohol or use drugs.  Family History:  Family History  Problem Relation Age of Onset  . Arthritis Mother   . Cancer Mother        uterine and mouth  . Hyperlipidemia Mother   . Stroke Mother   . Hypertension Mother   . Alcohol abuse Father   . Diabetes Father     . Cancer Sister        breast  . Diabetes Sister   . Cancer Brother        lung cancer  . Kidney disease Brother   . Diabetes Brother   . Hyperlipidemia Sister   . Heart disease Sister   . Hypertension Sister   . Diabetes Sister   . Hyperlipidemia Brother   . Kidney failure Brother   . Diabetes Daughter   . Addison's disease Other      Prior to Admission medications   Medication Sig Start Date End Date Taking? Authorizing Provider  albuterol (PROVENTIL HFA;VENTOLIN HFA) 108 (90 Base) MCG/ACT inhaler Inhale 2 puffs into the lungs every 4 (four) hours as needed for wheezing. 05/13/18  Yes Tower, [name], MD  aspirin 325 MG tablet Take 325 mg by mouth daily.   Yes [provider]  atorvastatin (LIPITOR) 80 MG tablet Take 80 mg by mouth daily.   Yes [provider]  benzonatate (TESSALON) 200 MG capsule Take 1 capsule (200 mg total) by mouth 3 (three) times daily as needed. Do not bite pill 05/13/18  Yes Tower, [name], MD  carvedilol (COREG) 12.5 MG tablet Take 1 tablet (12.5 mg total) by mouth 2 (two) times daily with a meal. 08/03/13  Yes Gollan, Kathlene November, MD  cetirizine (ZYRTEC) 10 MG tablet Take 10 mg by mouth 2 (two) times daily.   Yes [provider]  Cholecalciferol (VITAMIN D-3) 1000 units CAPS Take 1 capsule by mouth daily. 05/10/09  Yes [provider]  clotrimazole-betamethasone (LOTRISONE) cream Apply 1 application topically as needed (groin).  06/19/15  Yes [provider]  dapsone 25 MG tablet Take 25 mg by mouth daily.    Yes [provider]  gabapentin (NEURONTIN) 300 MG capsule Take 1 capsule (300 mg total) by mouth 3 (three) times daily. 06/09/18  Yes Tower, Marne A, MD  glimepiride (AMARYL) 1 MG tablet Take 2 mg by mouth daily.    Yes [provider]  hydrALAZINE (APRESOLINE) 50 MG tablet Take 1 tablet by mouth 2 (two) times daily. 02/28/14  Yes [provider]  isosorbide mononitrate (IMDUR) 30 MG 24  hr tablet Take 1 tablet (30 mg total) by mouth daily. 08/03/13  Yes Minna Merritts, MD  letrozole [facility]) 2.5 MG tablet Take 2.5 mg by mouth daily.   Yes [provider]  losartan (COZAAR) 50 MG tablet Take 50 mg by mouth daily.   Yes [provider]  metFORMIN (GLUCOPHAGE-XR) 500 MG 24 hr tablet Take 1,000 mg by mouth 2 (two) times daily.   Yes [provider]  MILK THISTLE PO Take 1 capsule by mouth 2 (two) times daily.    Yes [provider]  nitroGLYCERIN (NITROSTAT) 0.4 MG SL tablet Place 0.4 mg under the tongue every 5 (five) minutes as needed for chest pain.   Yes [provider]  pantoprazole (PROTONIX) 40 MG tablet Take 1 tablet (40 mg total) by mouth daily. 06/09/18  Yes Tower, [name], MD  promethazine-dextromethorphan (PROMETHAZINE-DM) 6.25-15 MG/5ML syrup TAKE 5 ML BY MOUTH 4 TIMES DAILY AS NEEDED FOR COUGH (CAUTION  SEDATION) Patient taking differently: Take 5 mLs by mouth 4 (four) times daily as needed for cough.  05/22/18  Yes Tower, [name], MD  traMADol (ULTRAM) 50 MG tablet Take 1-2 tablets (50-100 mg total) by mouth every 8 (eight) hours as needed for moderate pain or severe pain. 03/09/18  Yes Tower, [name], MD  vitamin B-12 (CYANOCOBALAMIN) 1000 MCG tablet Take 1,000 mcg by mouth daily.    Yes [provider]  glucose blood (ONE TOUCH ULTRA TEST) test strip Use 3 (three) times daily. 02/08/16   [provider]    Physical Exam: Vitals:   06/10/18 1933 06/10/18 1945 06/10/18 2030 06/10/18 2115  BP:  (!) 129/51 (!) 128/47 (!) 113/40  Pulse:  86 83 79  Resp:  (!) 23 20 17   Temp: (S) 100 F (37.8 C)     TempSrc: Oral     SpO2:  (!) 89% 96% 97%   General: Not in acute distress HEENT:       Eyes: PERRL, EOMI, no scleral icterus.       ENT: No discharge from the ears and nose, no pharynx injection, no tonsillar enlargement.  Neck: No JVD, no bruit, no mass felt. Heme: No neck lymph node  enlargement. Cardiac: S1/S2, RRR, No murmurs, No gallops or rubs. Respiratory: No rales, wheezing, rhonchi or rubs. GI: Soft, nondistended, has tenderness in upper abdomen, no rebound pain, no organomegaly, BS present. GU: No hematuria Ext: No pitting leg edema bilaterally. 2+DP/PT pulse bilaterally. Musculoskeletal: No joint deformities, No joint redness or warmth, no limitation of ROM in spin. Skin: No rashes.  Neuro: Alert, oriented X3, cranial nerves II-XII grossly intact, moves all extremities normally. Psych: Patient is not psychotic, no suicidal or hemocidal ideation.  Labs on Admission: I have personally reviewed following labs and imaging studies  CBC: Recent Labs  Lab 06/09/18 1218 06/10/18 1713  WBC 4.9 6.1  NEUTROABS 2.4 4.6  HGB 11.4* 11.0*  HCT 34.9* 35.8*  MCV 96.9 98.6  PLT 281.0 831   Basic Metabolic Panel: Recent Labs  Lab 06/10/18 1713  NA 143  K 3.8  CL 104  CO2 29  GLUCOSE 159*  BUN 11  CREATININE 1.24*  CALCIUM 9.1   GFR: Estimated Creatinine Clearance: 30.2 mL/min (A) (by C-G formula based on SCr of 1.24 mg/dL (H)). Liver Function Tests: Recent Labs  Lab 06/09/18 1218 06/10/18 1713  AST 29 270*  ALT 41* 105*  ALKPHOS 255* 316*  BILITOT 1.0 3.2*  PROT 6.0 6.2*  ALBUMIN 3.4* 3.0*   No results for input(s): LIPASE, AMYLASE in the last 168 hours. No results for input(s): AMMONIA in the last 168 hours. Coagulation Profile: Recent Labs  Lab 06/10/18 1713  INR 0.96   Cardiac Enzymes: No results for input(s): CKTOTAL, CKMB, CKMBINDEX, TROPONINI in the last 168 hours. BNP (last 3 results) No results for input(s): PROBNP in the last 8760 hours. HbA1C: No results for input(s): HGBA1C in the last 72 hours. CBG: No results for input(s): GLUCAP in the last 168 hours. Lipid Profile: Recent Labs    06/09/18 1218  CHOL 121  HDL 33.90*  LDLCALC 54  TRIG 164.0*  CHOLHDL 4   Thyroid Function Tests: Recent Labs    06/09/18 1218  TSH  2.60   Anemia Panel: No results for input(s): VITAMINB12, FOLATE, FERRITIN, TIBC, IRON, RETICCTPCT in the last 72 hours. Urine analysis:    Component Value Date/Time   COLORURINE AMBER (A) 06/10/2018 1840   APPEARANCEUR CLEAR 06/10/2018 1840   LABSPEC 1.017 06/10/2018 1840   PHURINE 7.0 06/10/2018 1840   GLUCOSEU NEGATIVE 06/10/2018 1840   HGBUR NEGATIVE 06/10/2018 1840   BILIRUBINUR NEGATIVE 06/10/2018 1840   BILIRUBINUR neg. 03/01/2014 0829   KETONESUR NEGATIVE 06/10/2018 1840   PROTEINUR 100 (A) 06/10/2018 1840   UROBILINOGEN 0.2 03/01/2014 0829   UROBILINOGEN 0.2 01/13/2014 1102   NITRITE NEGATIVE 06/10/2018 1840   LEUKOCYTESUR NEGATIVE 06/10/2018 1840   Sepsis Labs: @LABRCNTIP (procalcitonin:4,lacticidven:4) )No results found for this or any previous visit (from the past 240 hour(s)).   Radiological Exams on Admission: Dg Chest 2 View  Result Date: 06/10/2018 CLINICAL DATA:  Chills, headache, and body aches all day. Diagnosed with the flu. EXAM: CHEST - 2 VIEW COMPARISON:  05/13/2018. FINDINGS: Stable hyperinflation. LEFT AICD unchanged. Cardiomegaly. Thoracic atherosclerosis. Clear lung fields. Straightening of the normal thoracic kyphosis. No worrisome osseous lesion. Stable appearance from priors. IMPRESSION: No active cardiopulmonary disease. Electronically Signed   By: Staci Righter M.D.   On: 06/10/2018 18:24   US Abdomen Limited  Result Date: 06/10/2018 CLINICAL DATA:  Fever, abnormal liver function tests and heartburn. Known gallstones. EXAM: ULTRASOUND ABDOMEN  LIMITED RIGHT UPPER QUADRANT COMPARISON:  None. FINDINGS: Gallbladder: Mild single wall thickness of the gallbladder is suggested to 5 mm, normal at 3 mm or less. The gallbladder is physiologically distended without pathologic distention however. There also appear to be echogenic shadowing gallstones up to 1.3 cm near the neck of the gallbladder admixed with sludge. Sonographer indicates no sonographic Murphy's sign.  Common bile duct: Diameter: 10 mm without choledocholithiasis. Liver: No focal lesion identified. Within normal limits in parenchymal echogenicity. Portal vein is patent on color Doppler imaging with normal direction of blood flow towards the liver. IMPRESSION: 1. Slightly thickened appearance of the gallbladder without significant distention nor pericholecystic fluid. Intraluminal biliary sludge and gallstones are identified. Findings may be secondary to a chronic cholecystitis. HIDA scan may help for further assessment. 2. Dilated common bowel duct to 10 mm without choledocholithiasis. There is a slightly above expected for age. Recently passed stone may contribute to this appearance. Electronically Signed   By: Ashley Royalty M.D.   On: 06/10/2018 19:40     EKG: Independently reviewed.  Sinus rhythm, QTC 444, low voltage, LAD, poor R wave progression, nonspecific T wave change.    Assessment/Plan Principal Problem:   Abdominal pain Active Problems:   Type II diabetes mellitus with renal manifestations (HCC)   HYPERCHOLESTEROLEMIA   Essential hypertension   Gallstones   Stroke, small vessel (HCC)   Chronic systolic CHF (congestive heart failure) (HCC)   Sepsis (HCC)   Abnormal LFTs   GERD (gastroesophageal reflux disease)   Acute renal failure superimposed on stage 3 chronic kidney disease (HCC)   Cholecystitis   Abdominal pain and sepsis due to possible cholecystitis and gallstone: Patient meets critical for sepsis with fever, tachypnea.  Her lactic acid is elevated 2.93.  Currently hemodynamically stable.  General surgeon, Dr. Brantley Stage is consulted by EDP. -Admit to telemetry bed as inpatient - Antibiotics: Zosyn (patient received 1 dose of cefepime and Flagyl in ED) -Blood culture -will get Procalcitonin and trend lactic acid levels per sepsis protocol. -IVF: 250 mL of NS bolus in ED, followed by 50 cc/h ((patient has a congestive heart failure with EF 15%, limiting aggressive IV  fluids treatment). Will give small bolus as needed depending on lactic acid levels -PRN morphine for pain and Zofran for nausea -INR/PTT/type screen -Follow-up surgeon's recommendation -Keep patient n.p.o. after midnight  Type II diabetes mellitus with renal manifestations ([place]): Last A1c 7.7 on 05/02/18, not well controled. Patient is taking metformin and Amaryl at home -SSI  HYPERCHOLESTEROLEMIA: -hold Lipitor due to abnormal liver function  Essential hypertension: Blood pressure 129/51 -hold Cozaar due to worsening renal function - Hold oral hydralazine since patient at the risk of developing hypotension due to sepsis -Continue Coreg -IV hydralazine as needed  Hx of Stroke, small vessel ([place]): -hold lipitor as above -on prn ASA (hold home ASA)  Chronic systolic CHF (congestive heart failure) ([place]):  Abnormal LFTs: likely due to gallstone -hold lipitor -avoid using tylenol -hepatitis panel  GERD (gastroesophageal reflux disease): -protonix  AoCKD-III: Baseline Cre is ~1.9, pt's Cre is 1.2 and BUN 11 on admission. Likely due todehydration and continuation of ARB - IVF as above - Follow up renal function by [facility]    Inpatient status:  # Patient requires inpatient status due to high intensity of service, high risk for further deterioration and high frequency of surveillance required.  I certify that at the point of admission it is my clinical judgment that the patient will require inpatient hospital  care spanning beyond 2 midnights from the point of admission.  . This patient has multiple chronic comorbidities including hypertension, hyperlipidemia, diabetes mellitus, stroke, GERD, left bundle blockage, migraine headache, AICD placement, vertigo, solitary kidney, CHF with EF of 15%, breast cancer (lumpectomy). . Now patient has presenting symptoms include abdominal pain due to gallstones and possible cholecystitis.  Also have fever, chills. . The worrisome physical exam  findings include upper abdomen tenderness . The initial radiographic and laboratory data are worrisome because of sepsis, worsening renal function, gallstone, possible cholecystitis but abdominal ultrasound. . Current medical needs: please see my assessment and plan . Predictability of an adverse outcome (risk): Patient has multiple comorbidities, including CHF with EF of 15%, now presents with sepsis secondary to possible cholecystitis.  Patient likely needs surgery.  Patient is at high risk of deteriorating.  Will need to stay in hospital for at least 2 days.     DVT ppx: SCD Code Status: Full code Family Communication:  Yes, patient's husband at bed side Disposition Plan:  Anticipate discharge back to previous home environment Consults called:  Dr. Patterson Hammersmith of general surgeon Admission status:  Inpatient/tele      Date of Service 06/10/2018    Ivor Costa [facility] Pager [phone]  If 7PM-7AM, please contact night-coverage [email or web address] Password [facility] 06/10/2018, 9:54 PM

## 2018-06-10 NOTE — ED Triage Notes (Signed)
Pt. Stated, Ive had chills all day and went to Dr. And sent me here. I also have a headache and body aches.

## 2018-06-10 NOTE — ED Notes (Signed)
Urine culture tube sent down with urine sample.   

## 2018-06-11 ENCOUNTER — Encounter (HOSPITAL_COMMUNITY): Payer: Self-pay | Admitting: General Practice

## 2018-06-11 ENCOUNTER — Other Ambulatory Visit: Payer: Self-pay

## 2018-06-11 DIAGNOSIS — Z95810 Presence of automatic (implantable) cardiac defibrillator: Secondary | ICD-10-CM

## 2018-06-11 DIAGNOSIS — Z885 Allergy status to narcotic agent status: Secondary | ICD-10-CM

## 2018-06-11 DIAGNOSIS — R112 Nausea with vomiting, unspecified: Secondary | ICD-10-CM

## 2018-06-11 DIAGNOSIS — R1010 Upper abdominal pain, unspecified: Secondary | ICD-10-CM

## 2018-06-11 DIAGNOSIS — Z888 Allergy status to other drugs, medicaments and biological substances status: Secondary | ICD-10-CM

## 2018-06-11 DIAGNOSIS — K8080 Other cholelithiasis without obstruction: Secondary | ICD-10-CM

## 2018-06-11 DIAGNOSIS — N183 Chronic kidney disease, stage 3 (moderate): Secondary | ICD-10-CM

## 2018-06-11 DIAGNOSIS — R748 Abnormal levels of other serum enzymes: Secondary | ICD-10-CM

## 2018-06-11 DIAGNOSIS — Z886 Allergy status to analgesic agent status: Secondary | ICD-10-CM

## 2018-06-11 DIAGNOSIS — R7881 Bacteremia: Secondary | ICD-10-CM

## 2018-06-11 DIAGNOSIS — R51 Headache: Secondary | ICD-10-CM

## 2018-06-11 DIAGNOSIS — I428 Other cardiomyopathies: Secondary | ICD-10-CM

## 2018-06-11 DIAGNOSIS — N179 Acute kidney failure, unspecified: Secondary | ICD-10-CM

## 2018-06-11 DIAGNOSIS — B952 Enterococcus as the cause of diseases classified elsewhere: Secondary | ICD-10-CM | POA: Diagnosis present

## 2018-06-11 LAB — GLUCOSE, CAPILLARY
Glucose-Capillary: 55 mg/dL — ABNORMAL LOW (ref 70–99)
Glucose-Capillary: 79 mg/dL (ref 70–99)
Glucose-Capillary: 225 mg/dL — ABNORMAL HIGH (ref 70–99)

## 2018-06-11 LAB — BLOOD CULTURE ID PANEL (REFLEXED)

## 2018-06-11 LAB — LIPASE, BLOOD: Lipase: 21 U/L (ref 11–51)

## 2018-06-11 LAB — BASIC METABOLIC PANEL
Anion gap: 10 (ref 5–15)
BUN: 15 mg/dL (ref 8–23)
CO2: 29 mmol/L (ref 22–32)
Calcium: 8.3 mg/dL — ABNORMAL LOW (ref 8.9–10.3)
Chloride: 102 mmol/L (ref 98–111)
Creatinine, Ser: 1.48 mg/dL — ABNORMAL HIGH (ref 0.44–1.00)
GFR calc non Af Amer: 33 mL/min — ABNORMAL LOW (ref >60)
GFR, EST AFRICAN AMERICAN: 38 mL/min — AB (ref >60)
Glucose, Bld: 198 mg/dL — ABNORMAL HIGH (ref 70–99)
Potassium: 4.2 mmol/L (ref 3.5–5.1)
Sodium: 141 mmol/L (ref 135–145)

## 2018-06-11 LAB — CBG MONITORING, ED
GLUCOSE-CAPILLARY: 208 mg/dL — AB (ref 70–99)
Glucose-Capillary: 110 mg/dL — ABNORMAL HIGH (ref 70–99)

## 2018-06-11 LAB — CBC
HCT: 31.7 % — ABNORMAL LOW (ref 36.0–46.0)
Hemoglobin: 9.7 g/dL — ABNORMAL LOW (ref 12.0–15.0)
MCH: 30.6 pg (ref 26.0–34.0)
MCHC: 30.6 g/dL (ref 30.0–36.0)
MCV: 100 fL (ref 80.0–100.0)
Platelets: 231 10*3/uL (ref 150–400)
RBC: 3.17 MIL/uL — ABNORMAL LOW (ref 3.87–5.11)
RDW: 13.5 % (ref 11.5–15.5)
WBC: 12.3 10*3/uL — ABNORMAL HIGH (ref 4.0–10.5)
nRBC: 0 % (ref 0.0–0.2)

## 2018-06-11 LAB — HEPATIC FUNCTION PANEL
ALT: 128 U/L — ABNORMAL HIGH (ref 0–44)
AST: 264 U/L — ABNORMAL HIGH (ref 15–41)
Albumin: 2.7 g/dL — ABNORMAL LOW (ref 3.5–5.0)
Alkaline Phosphatase: 256 U/L — ABNORMAL HIGH (ref 38–126)
Bilirubin, Direct: 2.7 mg/dL — ABNORMAL HIGH (ref 0.0–0.2)
Indirect Bilirubin: 1.6 mg/dL — ABNORMAL HIGH (ref 0.3–0.9)
Total Bilirubin: 4.3 mg/dL — ABNORMAL HIGH (ref 0.3–1.2)
Total Protein: 5.5 g/dL — ABNORMAL LOW (ref 6.5–8.1)

## 2018-06-11 LAB — ABO/RH: ABO/RH(D): O POS

## 2018-06-11 LAB — LACTIC ACID, PLASMA: Lactic Acid, Venous: 2.2 mmol/L (ref 0.5–1.9)

## 2018-06-11 MED ORDER — SODIUM CHLORIDE 0.9 % IV BOLUS
250.0000 mL | Freq: Once | INTRAVENOUS | Status: AC
  Administered 2018-06-11: 250 mL via INTRAVENOUS

## 2018-06-11 MED ORDER — PIPERACILLIN-TAZOBACTAM 3.375 G IVPB: 3.3750 g | Freq: Three times a day (TID) | INTRAVENOUS | Status: DC

## 2018-06-11 MED ORDER — VANCOMYCIN HCL 10 G IV SOLR
1500.0000 mg | INTRAVENOUS | Status: DC
  Filled 2018-06-11: qty 1500

## 2018-06-11 MED ORDER — HEPARIN SODIUM (PORCINE) 5000 UNIT/ML IJ SOLN
5000.0000 [IU] | Freq: Three times a day (TID) | INTRAMUSCULAR | Status: DC
  Administered 2018-06-12 – 2018-06-15 (×7): 5000 [IU] via SUBCUTANEOUS
  Filled 2018-06-11 (×7): qty 1

## 2018-06-11 MED ORDER — SODIUM CHLORIDE 0.9 % IV SOLN
INTRAVENOUS | Status: DC
  Administered 2018-06-11 – 2018-06-12 (×2): via INTRAVENOUS

## 2018-06-11 MED ORDER — PIPERACILLIN-TAZOBACTAM 3.375 G IVPB
3.3750 g | Freq: Three times a day (TID) | INTRAVENOUS | Status: DC
  Administered 2018-06-11 – 2018-06-14 (×8): 3.375 g via INTRAVENOUS
  Filled 2018-06-11 (×7): qty 50

## 2018-06-11 NOTE — Consult Note (Signed)
Reason for Consult: ? Choledocholithiasis Referring Physician: CCS  Deneise Lever HPI: This is an 84 year old female with a PMH of HTN, DM, CVA, CKD, nonischemic cardiomyopathy with an improved EF or 55%, and s/p AICD placement admitted for acute nausea, vomiting, and fever.  During the month of December she was struggling with a viral infection, but she was able to recover.  She was feeling well until 06/09/2018 where she started to have her symptoms of nausea, vomiting, and shaking chills.  A temperature was obtained and it was at 103.  In the ER her liver enzymes were noted to be elevated, first her AP was at 255 on 1/7 and then there was a concomitant rise in the AST and ALT, 270 and 105, respectively.  Her AP also peaked to 316.  Her liver enzymes have started to decline but her TB has increased to 4.3.  An abdominal ultrasound shows a mildly thickened gallbladder filled with stones and sludge.  The largest stone measures 1.3 cm.  Her CBD is dilated at 10 mm.  An enteroccocal bacteremia was detected with the blood culture and she was started on Zosyn at the outset.  Past Medical History:  Diagnosis Date  . Arthritis    "hands" (01/13/2014)  . Basal cell carcinoma 01/2014   "bridge of nose"  . Cardiac LV ejection fraction >40%    "it was 43 last year" (01/13/2014)  . Chronic lower back pain   . CKD (chronic kidney disease), stage III (HCC)    acute on chronic stage III/notes 06/10/2018  . Colon polyps   . Expressive aphasia    "3 times in the last week" (01/13/2014)  . GERD (gastroesophageal reflux disease)   . Goiter past remote   treated with RI  . Hyperlipidemia   . Hyperpotassemia   . Hypertension   . Hypertonicity of bladder   . Inflammatory and toxic neuropathy, unspecified   . LBBB (left bundle branch block)   . Migraine    "stopped many years ago" (01/13/2014)  . Mini stroke ([place]) 01/2014  . Other primary cardiomyopathies   . Overactive bladder   . Presence of combination internal  cardiac defibrillator (ICD) and pacemaker   . Reflex sympathetic dystrophy, unspecified   . Shingles   . Shortness of breath    ambulation  . Stroke ([place])   . Thyroid disease   . Type II diabetes mellitus ([place])   . Ulcer   . Urine incontinence   . Vertigo    hx of    Past Surgical History:  Procedure Laterality Date  . BACK SURGERY    . BI-VENTRICULAR PACEMAKER INSERTION (CRT-P)  10/2014   [facility]  . BREAST CYST EXCISION Left 1959  . CARDIAC CATHETERIZATION  "several"   [place]  . CATARACT EXTRACTION W/ INTRAOCULAR LENS IMPLANT Left ~ 2005   several eye injections  . COLONOSCOPY  7/12   normal (hx of polyps in past)   . CT SCAN  3/12   outside hosp- lung nodule and gallstones  . [place]   "knot on index"  . KIDNEY DONATION Left 1989  . LUMBAR LAMINECTOMY/DECOMPRESSION MICRODISCECTOMY  1999  . LUMBAR LAMINECTOMY/DECOMPRESSION MICRODISCECTOMY  01/31/2012   Procedure: LUMBAR LAMINECTOMY/DECOMPRESSION MICRODISCECTOMY 2 LEVELS;  Surgeon: Eustace Moore, MD;  Location: [facility];  Service: Neurosurgery;  Laterality: N/A;  Thoracic twelve-lumbar one, lumbar one-two laminectomy   . POSTERIOR LAMINECTOMY / DECOMPRESSION CERVICAL SPINE  1999  .  TUBAL LIGATION  1976  . UPPER GASTROINTESTINAL ENDOSCOPY      Family History  Problem Relation Age of Onset  . Arthritis Mother   . Cancer Mother        uterine and mouth  . Hyperlipidemia Mother   . Stroke Mother   . Hypertension Mother   . Alcohol abuse Father   . Diabetes Father   . Cancer Sister        breast  . Diabetes Sister   . Cancer Brother        lung cancer  . Kidney disease Brother   . Diabetes Brother   . Hyperlipidemia Sister   . Heart disease Sister   . Hypertension Sister   . Diabetes Sister   . Hyperlipidemia Brother   . Kidney failure Brother   . Diabetes Daughter   . Addison's disease Other     Social History:  reports that she has never smoked. She has never used smokeless  tobacco. She reports that she does not drink alcohol or use drugs.  Allergies:  Allergies  Allergen Reactions  . Ace Inhibitors Swelling    REACTION: tongue swelling  . Codeine Other (See Comments)    REACTION: nausea  . Hydrochlorothiazide Other (See Comments)    unknown  . Insulin Detemir Itching  . Lisinopril Swelling    Swelling of tongue  . Nsaids Other (See Comments)    Unable to take D/T kidney issues  . Tylenol [Acetaminophen] Other (See Comments)    Pt has one kidney and says she can't take it    Medications:  Scheduled: . carvedilol  12.5 mg Oral BID WC  . cholecalciferol  1,000 Units Oral Daily  . dapsone  25 mg Oral Daily  . gabapentin  300 mg Oral TID  . heparin injection (subcutaneous)  5,000 Units Subcutaneous Q8H  . insulin aspart  0-5 Units Subcutaneous QHS  . insulin aspart  0-9 Units Subcutaneous TID WC  . isosorbide mononitrate  30 mg Oral Daily  . letrozole  2.5 mg Oral Daily  . loratadine  10 mg Oral Daily  .  morphine injection  4 mg Intravenous Once  . ondansetron (ZOFRAN) IV  4 mg Intravenous Once  . pantoprazole  40 mg Oral Daily  . vitamin B-12  1,000 mcg Oral Daily   Continuous: . sodium chloride 125 mL/hr at 06/11/18 1741  . piperacillin-tazobactam (ZOSYN)  IV 3.375 g (06/11/18 1726)    Results for orders placed or performed during the hospital encounter of 06/10/18 (from the past 24 hour(s))  I-Stat CG4 Lactic Acid, ED     Status: None   Collection Time: 06/10/18  8:48 PM  Result Value Ref Range   Lactic Acid, Venous 1.55 0.5 - 1.9 mmol/L  CBG monitoring, ED     Status: Abnormal   Collection Time: 06/10/18  9:57 PM  Result Value Ref Range   Glucose-Capillary 100 (H) 70 - 99 mg/dL  Brain natriuretic peptide     Status: Abnormal   Collection Time: 06/10/18 10:15 PM  Result Value Ref Range   B Natriuretic Peptide 220.2 (H) 0.0 - 100.0 pg/mL  APTT     Status: None   Collection Time: 06/10/18 10:15 PM  Result Value Ref Range   aPTT 27  24 - 36 seconds  Type and screen [place]     Status: None   Collection Time: 06/10/18 10:15 PM  Result Value Ref Range   ABO/RH(D) O POS  Antibody Screen NEG    Sample Expiration      06/13/2018 Performed at [facility], [place] [address], [place], [place] [address]   Lactic acid, plasma     Status: None   Collection Time: 06/10/18 10:15 PM  Result Value Ref Range   Lactic Acid, Venous 1.7 0.5 - 1.9 mmol/L  Procalcitonin     Status: None   Collection Time: 06/10/18 10:15 PM  Result Value Ref Range   Procalcitonin 7.41 ng/mL  ABO/Rh     Status: None   Collection Time: 06/10/18 10:15 PM  Result Value Ref Range   ABO/RH(D)      O POS Performed at [facility] [address], [place], [place] [address]   Basic metabolic panel     Status: Abnormal   Collection Time: 06/11/18  2:21 AM  Result Value Ref Range   Sodium 141 135 - 145 mmol/L   Potassium 4.2 3.5 - 5.1 mmol/L   Chloride 102 98 - 111 mmol/L   CO2 29 22 - 32 mmol/L   Glucose, Bld 198 (H) 70 - 99 mg/dL   BUN 15 8 - 23 mg/dL   Creatinine, Ser 1.48 (H) 0.44 - 1.00 mg/dL   Calcium 8.3 (L) 8.9 - 10.3 mg/dL   GFR calc non Af Amer 33 (L) >60 mL/min   GFR calc Af Amer 38 (L) >60 mL/min   Anion gap 10 5 - 15  CBC     Status: Abnormal   Collection Time: 06/11/18  2:21 AM  Result Value Ref Range   WBC 12.3 (H) 4.0 - 10.5 K/uL   RBC 3.17 (L) 3.87 - 5.11 MIL/uL   Hemoglobin 9.7 (L) 12.0 - 15.0 g/dL   HCT 31.7 (L) 36.0 - 46.0 %   MCV 100.0 80.0 - 100.0 fL   MCH 30.6 26.0 - 34.0 pg   MCHC 30.6 30.0 - 36.0 g/dL   RDW 13.5 11.5 - 15.5 %   Platelets 231 150 - 400 K/uL   nRBC 0.0 0.0 - 0.2 %  Lactic acid, plasma     Status: Abnormal   Collection Time: 06/11/18  2:21 AM  Result Value Ref Range   Lactic Acid, Venous 2.2 (HH) 0.5 - 1.9 mmol/L  Hepatic function panel     Status: Abnormal   Collection Time: 06/11/18  2:21 AM  Result Value Ref Range   Total Protein 5.5 (L) 6.5 - 8.1 g/dL    Albumin 2.7 (L) 3.5 - 5.0 g/dL   AST 264 (H) 15 - 41 U/L   ALT 128 (H) 0 - 44 U/L   Alkaline Phosphatase 256 (H) 38 - 126 U/L   Total Bilirubin 4.3 (H) 0.3 - 1.2 mg/dL   Bilirubin, Direct 2.7 (H) 0.0 - 0.2 mg/dL   Indirect Bilirubin 1.6 (H) 0.3 - 0.9 mg/dL  Lipase, blood     Status: None   Collection Time: 06/11/18  2:21 AM  Result Value Ref Range   Lipase 21 11 - 51 U/L  CBG monitoring, ED     Status: Abnormal   Collection Time: 06/11/18  7:05 AM  Result Value Ref Range   Glucose-Capillary 208 (H) 70 - 99 mg/dL   Comment 1 Notify RN    Comment 2 Repeat Test    Comment 3 Document in Chart   CBG monitoring, ED     Status: Abnormal   Collection Time: 06/11/18 11:47 AM  Result Value Ref Range   Glucose-Capillary 110 (  H) 70 - 99 mg/dL   Comment 1 Notify RN    Comment 2 Document in Chart   Glucose, capillary     Status: Abnormal   Collection Time: 06/11/18  5:14 PM  Result Value Ref Range   Glucose-Capillary 55 (L) 70 - 99 mg/dL  Glucose, capillary     Status: None   Collection Time: 06/11/18  5:46 PM  Result Value Ref Range   Glucose-Capillary 79 70 - 99 mg/dL     Dg Chest 2 View  Result Date: 06/10/2018 CLINICAL DATA:  Chills, headache, and body aches all day. Diagnosed with the flu. EXAM: CHEST - 2 VIEW COMPARISON:  05/13/2018. FINDINGS: Stable hyperinflation. LEFT AICD unchanged. Cardiomegaly. Thoracic atherosclerosis. Clear lung fields. Straightening of the normal thoracic kyphosis. No worrisome osseous lesion. Stable appearance from priors. IMPRESSION: No active cardiopulmonary disease. Electronically Signed   By: Staci Righter M.D.   On: 06/10/2018 18:24   US Abdomen Limited  Result Date: 06/10/2018 CLINICAL DATA:  Fever, abnormal liver function tests and heartburn. Known gallstones. EXAM: ULTRASOUND ABDOMEN LIMITED RIGHT UPPER QUADRANT COMPARISON:  None. FINDINGS: Gallbladder: Mild single wall thickness of the gallbladder is suggested to 5 mm, normal at 3 mm or less. The  gallbladder is physiologically distended without pathologic distention however. There also appear to be echogenic shadowing gallstones up to 1.3 cm near the neck of the gallbladder admixed with sludge. Sonographer indicates no sonographic Murphy's sign. Common bile duct: Diameter: 10 mm without choledocholithiasis. Liver: No focal lesion identified. Within normal limits in parenchymal echogenicity. Portal vein is patent on color Doppler imaging with normal direction of blood flow towards the liver. IMPRESSION: 1. Slightly thickened appearance of the gallbladder without significant distention nor pericholecystic fluid. Intraluminal biliary sludge and gallstones are identified. Findings may be secondary to a chronic cholecystitis. HIDA scan may help for further assessment. 2. Dilated common bowel duct to 10 mm without choledocholithiasis. There is a slightly above expected for age. Recently passed stone may contribute to this appearance. Electronically Signed   By: Ashley Royalty M.D.   On: 06/10/2018 19:40    ROS:  As stated above in the HPI otherwise negative.  Blood pressure 102/61, pulse 69, temperature 98.4 F (36.9 C), temperature source Oral, resp. rate 20, last menstrual period 06/03/1992, SpO2 94 %.    PE: Gen: NAD, Alert and Oriented HEENT:  [place]/AT, EOMI Neck: Supple, no LAD Lungs: CTA Bilaterally CV: RRR without M/G/R ABM: Soft, NTND, +BS Ext: No C/C/E  Assessment/Plan: 1) ? Choledocholithiasis. 2) Dilated CBD. 3) Enterococcal bacteremia.   The patient only has one kidney and she has an AICD.  This precludes both a CT scan with contrast and a MRCP.  The liver enzymes are consistent with an obstructive etiology, but it is unclear if she has passed a stone.    Plan: 1) ERCP with stone extraction tomorrow. 2) Hold heparin.  Ronnae Kaser D 06/11/2018, 8:33 PM

## 2018-06-11 NOTE — Progress Notes (Addendum)
Pharmacy Antibiotic Note  Morgan Hubbard is a 84 y.o. female admitted on 06/10/2018 with abdominal pain and sepsis due topossible cholecystitisandgallstone.   Pharmacy has been consulted for Vancomycin dosing for sepsis.  Plan: Vancomycin load 1500mg  IV x1 then Vancomycin 1000 mg IV Q 48  hrs.  Goal AUC 400-550. Expected AUC: 478 SCr used: 1.48 Monitor clinical status, renal function and culture results daily.  Vancomycin peak and trough if needed per protocol.      Temp (24hrs), Avg:99.9 F (37.7 C), Min:98.4 F (36.9 C), Max:103.2 F (39.6 C)  Recent Labs  Lab 06/09/18 1218 06/10/18 1713 06/10/18 1723 06/10/18 2048 06/10/18 2215 06/11/18 0221  WBC 4.9 6.1  --   --   --  12.3*  CREATININE  --  1.24*  --   --   --  1.48*  LATICACIDVEN  --   --  2.92* 1.55 1.7 2.2*    Estimated Creatinine Clearance: 25.3 mL/min (A) (by C-G formula based on SCr of 1.48 mg/dL (H)).    Allergies  Allergen Reactions  . Ace Inhibitors Swelling    REACTION: tongue swelling  . Codeine Other (See Comments)    REACTION: nausea  . Hydrochlorothiazide Other (See Comments)    unknown  . Insulin Detemir Itching  . Lisinopril Swelling    Swelling of tongue  . Nsaids Other (See Comments)    Unable to take D/T kidney issues  . Tylenol [Acetaminophen] Other (See Comments)    Pt has one kidney and says she can't take it    Thank you for allowing pharmacy to be a part of this patient's care. Nicole Cella, RPh Clinical Pharmacist Please check AMION for all [place] phone numbers After 10:00 PM, call [facility] [phone] 06/11/2018 4:06 PM   Addendum: vancomycin canceled per MD.

## 2018-06-11 NOTE — Plan of Care (Signed)
  Problem: Education: Goal: Knowledge of General Education information will improve Description Including pain rating scale, medication(s)/side effects and non-pharmacologic comfort measures Outcome: Progressing   Problem: Education: Goal: Knowledge of General Education information will improve Description Including pain rating scale, medication(s)/side effects and non-pharmacologic comfort measures Outcome: Progressing   Problem: Health Behavior/Discharge Planning: Goal: Ability to manage health-related needs will improve Outcome: Progressing   Problem: Clinical Measurements: Goal: Ability to maintain clinical measurements within normal limits will improve Outcome: Progressing Goal: Will remain free from infection Outcome: Progressing Goal: Diagnostic test results will improve Outcome: Progressing   Problem: Activity: Goal: Risk for activity intolerance will decrease Outcome: Progressing   Problem: Nutrition: Goal: Adequate nutrition will be maintained Outcome: Progressing   Problem: Coping: Goal: Level of anxiety will decrease Outcome: Progressing   Problem: Elimination: Goal: Will not experience complications related to bowel motility Outcome: Progressing   Problem: Pain Managment: Goal: General experience of comfort will improve Outcome: Progressing   Problem: Safety: Goal: Ability to remain free from injury will improve Outcome: Progressing   Problem: Skin Integrity: Goal: Risk for impaired skin integrity will decrease Outcome: Progressing

## 2018-06-11 NOTE — Progress Notes (Signed)
PHARMACY - PHYSICIAN COMMUNICATION CRITICAL VALUE ALERT - BLOOD CULTURE IDENTIFICATION (BCID)  Morgan Hubbard is an 84 y.o. female who presented to [facility] on 06/10/2018 with a chief complaint of concern for cholecystitis  Assessment:  1/2 enterococcus in blood  Name of physician (or Provider) Contacted: Dr Allyson Sabal Dr [name]  Current antibiotics: Vanc Zosyn  Changes to prescribed antibiotics recommended:  DC Vanc Adjust zosyn to 3.375 gm iv q8 Auto ID consult  Results for orders placed or performed during the hospital encounter of 06/10/18  Blood Culture ID Panel (Reflexed) (Collected: 06/10/2018  5:38 PM)  Result Value Ref Range   Enterococcus species DETECTED (A) NOT DETECTED   Vancomycin resistance NOT DETECTED NOT DETECTED   Listeria monocytogenes NOT DETECTED NOT DETECTED   Staphylococcus species NOT DETECTED NOT DETECTED   Staphylococcus aureus (BCID) NOT DETECTED NOT DETECTED   Streptococcus species NOT DETECTED NOT DETECTED   Streptococcus agalactiae NOT DETECTED NOT DETECTED   Streptococcus pneumoniae NOT DETECTED NOT DETECTED   Streptococcus pyogenes NOT DETECTED NOT DETECTED   Acinetobacter baumannii NOT DETECTED NOT DETECTED   Enterobacteriaceae species NOT DETECTED NOT DETECTED   Enterobacter cloacae complex NOT DETECTED NOT DETECTED   Escherichia coli NOT DETECTED NOT DETECTED   Klebsiella oxytoca NOT DETECTED NOT DETECTED   Klebsiella pneumoniae NOT DETECTED NOT DETECTED   Proteus species NOT DETECTED NOT DETECTED   Serratia marcescens NOT DETECTED NOT DETECTED   Haemophilus influenzae NOT DETECTED NOT DETECTED   Neisseria meningitidis NOT DETECTED NOT DETECTED   Pseudomonas aeruginosa NOT DETECTED NOT DETECTED   Candida albicans NOT DETECTED NOT DETECTED   Candida glabrata NOT DETECTED NOT DETECTED   Candida krusei NOT DETECTED NOT DETECTED   Candida parapsilosis NOT DETECTED NOT DETECTED   Candida tropicalis NOT DETECTED NOT DETECTED   Levester Fresh,  PharmD, BCPS, BCCCP Clinical Pharmacist [phone]  Please check AMION for all [place] numbers  06/11/2018 4:08 PM

## 2018-06-11 NOTE — Consult Note (Signed)
[facility]    Date of Admission:  06/10/2018           Day 1 piperacillin tazobactam       Reason for Consult: Automatic consultation for Enterococcal bacteremia     Assessment: Morgan Hubbard has enterococcal bacteremia.  She does not have any abdominal pain still concerned about the possibility of acute cholecystitis/cholangitis.  She has been aware of her gallstones for years but she has new liver enzyme elevation.  There is no other obvious source for infection.  I agree with continuing piperacillin and tazobactam pending further observation and HIDA scan.  Given the presence of her AICD there is risk for developing endocarditis and/or infection on her leads.  I will repeat blood cultures tomorrow and order an echocardiogram.  Plan: 1. Continue piperacillin tazobactam 2. Repeat blood cultures in a.m. 3. Transthoracic echocardiogram  Principal Problem:   Enterococcal bacteremia Active Problems:   Gallstones   Sepsis (HCC)   Abnormal LFTs   Cholecystitis   AICD (automatic cardioverter/defibrillator) present   Type II diabetes mellitus with renal manifestations (HCC)   HYPERCHOLESTEROLEMIA   Essential hypertension   Nonischemic cardiomyopathy (HCC)   Stroke, small vessel (HCC)   Chronic systolic CHF (congestive heart failure) (HCC)   Abdominal pain   GERD (gastroesophageal reflux disease)   Acute renal failure superimposed on stage 3 chronic kidney disease (HCC)   Scheduled Meds: . carvedilol  12.5 mg Oral BID WC  . cholecalciferol  1,000 Units Oral Daily  . dapsone  25 mg Oral Daily  . gabapentin  300 mg Oral TID  . heparin injection (subcutaneous)  5,000 Units Subcutaneous Q8H  . insulin aspart  0-5 Units Subcutaneous QHS  . insulin aspart  0-9 Units Subcutaneous TID WC  . isosorbide mononitrate  30 mg Oral Daily  . letrozole  2.5 mg Oral Daily  . loratadine  10 mg Oral Daily  .  morphine injection  4 mg Intravenous Once  .  ondansetron (ZOFRAN) IV  4 mg Intravenous Once  . pantoprazole  40 mg Oral Daily  . vitamin B-12  1,000 mcg Oral Daily   Continuous Infusions: . sodium chloride 125 mL/hr at 06/11/18 1722  . piperacillin-tazobactam (ZOSYN)  IV 3.375 g (06/11/18 1726)   PRN Meds:.albuterol, aspirin EC, hydrALAZINE, nitroGLYCERIN, ondansetron **OR** ondansetron (ZOFRAN) IV, promethazine, senna-docusate, traMADol, zolpidem  HPI: Morgan Hubbard is a 84 y.o. female with multiple chronic medical problems who was in her usual state of health until about 3:30 yesterday afternoon when she had sudden onset of nausea, vomiting, fever and rigors.  Her PCP who referred her here for admission.  She had a temperature of 103.2 degrees and elevated liver enzymes.  Her admission note indicates that she had abdominal pain although she denies that to me.  She underwent an abdominal ultrasound which showed a large gallstone with some thickening of the gallbladder wall and dilation of the common bile duct.  She had some acute on chronic kidney injury.  She was started on broad empiric antibiotic therapy.  1 of 2 admission blood cultures is growing Enterococcus.  She has not had any dysuria and her urinalysis is unremarkable.  She has a history of nonischemic cardiomyopathy and has had an AICD for about 3 years.  She has not noted any swelling, redness or pain around the pocket.  She is feeling a little bit better today.   Review of Systems: Review  of Systems  Constitutional: Positive for chills, diaphoresis, fever and malaise/fatigue.  Respiratory: Negative for cough.   Cardiovascular: Negative for chest pain.  Gastrointestinal: Positive for nausea and vomiting. Negative for abdominal pain, constipation and diarrhea.  Genitourinary: Negative for dysuria.  Skin: Negative for rash.  Neurological: Positive for headaches.    Past Medical History:  Diagnosis Date  . Arthritis    "hands" (01/13/2014)  . Basal cell carcinoma 01/2014    "bridge of nose"  . Cardiac LV ejection fraction >40%    "it was 43 last year" (01/13/2014)  . Chronic lower back pain   . CKD (chronic kidney disease), stage III (HCC)    acute on chronic stage III/notes 06/10/2018  . Colon polyps   . Expressive aphasia    "3 times in the last week" (01/13/2014)  . GERD (gastroesophageal reflux disease)   . Goiter past remote   treated with RI  . Hyperlipidemia   . Hyperpotassemia   . Hypertension   . Hypertonicity of bladder   . Inflammatory and toxic neuropathy, unspecified   . LBBB (left bundle branch block)   . Migraine    "stopped many years ago" (01/13/2014)  . Mini stroke ([place]) 01/2014  . Other primary cardiomyopathies   . Overactive bladder   . Presence of combination internal cardiac defibrillator (ICD) and pacemaker   . Reflex sympathetic dystrophy, unspecified   . Shingles   . Shortness of breath    ambulation  . Stroke ([place])   . Thyroid disease   . Type II diabetes mellitus ([place])   . Ulcer   . Urine incontinence   . Vertigo    hx of    Social History   Tobacco Use  . Smoking status: Never Smoker  . Smokeless tobacco: Never Used  Substance Use Topics  . Alcohol use: No    Alcohol/week: 0.0 standard drinks  . Drug use: No    Family History  Problem Relation Age of Onset  . Arthritis Mother   . Cancer Mother        uterine and mouth  . Hyperlipidemia Mother   . Stroke Mother   . Hypertension Mother   . Alcohol abuse Father   . Diabetes Father   . Cancer Sister        breast  . Diabetes Sister   . Cancer Brother        lung cancer  . Kidney disease Brother   . Diabetes Brother   . Hyperlipidemia Sister   . Heart disease Sister   . Hypertension Sister   . Diabetes Sister   . Hyperlipidemia Brother   . Kidney failure Brother   . Diabetes Daughter   . Addison's disease Other    Allergies  Allergen Reactions  . Ace Inhibitors Swelling    REACTION: tongue swelling  . Codeine Other (See Comments)     REACTION: nausea  . Hydrochlorothiazide Other (See Comments)    unknown  . Insulin Detemir Itching  . Lisinopril Swelling    Swelling of tongue  . Nsaids Other (See Comments)    Unable to take D/T kidney issues  . Tylenol [Acetaminophen] Other (See Comments)    Pt has one kidney and says she can't take it    OBJECTIVE: Blood pressure 102/61, pulse 69, temperature 98.4 F (36.9 C), temperature source Oral, resp. rate 20, last menstrual period 06/03/1992, SpO2 94 %.  Physical Exam Constitutional:      Comments: She is resting quietly in  bed.  She is smiling and in good spirits.  Her husband is at the bedside.  Neck:     Musculoskeletal: Neck supple.  Cardiovascular:     Rate and Rhythm: Normal rate and regular rhythm.     Heart sounds: No murmur.  Pulmonary:     Effort: Pulmonary effort is normal.     Breath sounds: Normal breath sounds.  Chest:    Abdominal:     General: There is no distension.     Palpations: Abdomen is soft. There is no mass.     Tenderness: There is no abdominal tenderness.  Skin:    Findings: No rash.  Psychiatric:        Mood and Affect: Mood normal.     Lab Results Lab Results  Component Value Date   WBC 12.3 (H) 06/11/2018   HGB 9.7 (L) 06/11/2018   HCT 31.7 (L) 06/11/2018   MCV 100.0 06/11/2018   PLT 231 06/11/2018    Lab Results  Component Value Date   CREATININE 1.48 (H) 06/11/2018   BUN 15 06/11/2018   NA 141 06/11/2018   K 4.2 06/11/2018   CL 102 06/11/2018   CO2 29 06/11/2018    Lab Results  Component Value Date   ALT 128 (H) 06/11/2018   AST 264 (H) 06/11/2018   ALKPHOS 256 (H) 06/11/2018   BILITOT 4.3 (H) 06/11/2018     Microbiology: Recent Results (from the past 240 hour(s))  Culture, blood (Routine x 2)     Status: None (Preliminary result)   Collection Time: 06/10/18  5:12 PM  Result Value Ref Range Status   Specimen Description SITE NOT SPECIFIED  Final   Special Requests   Final    BOTTLES DRAWN AEROBIC AND  ANAEROBIC Blood Culture adequate volume   Culture   Final    NO GROWTH < 24 HOURS Performed at [facility], [address] [place], [place] [address]    Report Status PENDING  Incomplete  Culture, blood (Routine x 2)     Status: None (Preliminary result)   Collection Time: 06/10/18  5:38 PM  Result Value Ref Range Status   Specimen Description BLOOD LEFT ANTECUBITAL  Final   Special Requests   Final    BOTTLES DRAWN AEROBIC AND ANAEROBIC Blood Culture results may not be optimal due to an excessive volume of blood received in culture bottles   Culture  Setup Time   Final    GRAM POSITIVE COCCI AEROBIC BOTTLE ONLY Organism ID to follow CRITICAL RESULT CALLED TO, READ BACK BY AND VERIFIED WITH: Hughie Closs PharmD 15:40 06/11/18 ([email or web address])    Culture   Final    NO GROWTH < 24 HOURS Performed at [facility], [place] [address], [place], [place] [address]    Report Status PENDING  Incomplete  Blood Culture ID Panel (Reflexed)     Status: Abnormal   Collection Time: 06/10/18  5:38 PM  Result Value Ref Range Status   Enterococcus species DETECTED (A) NOT DETECTED Final    Comment: CRITICAL RESULT CALLED TO, READ BACK BY AND VERIFIED WITH: Hughie Closs PharmD 15:40 06/11/18 ([email or web address])    Vancomycin resistance NOT DETECTED NOT DETECTED Final   Listeria monocytogenes NOT DETECTED NOT DETECTED Final   Staphylococcus species NOT DETECTED NOT DETECTED Final   Staphylococcus aureus (BCID) NOT DETECTED NOT DETECTED Final   Streptococcus species NOT DETECTED NOT DETECTED Final   Streptococcus agalactiae NOT DETECTED NOT DETECTED Final  Streptococcus pneumoniae NOT DETECTED NOT DETECTED Final   Streptococcus pyogenes NOT DETECTED NOT DETECTED Final   Acinetobacter baumannii NOT DETECTED NOT DETECTED Final   Enterobacteriaceae species NOT DETECTED NOT DETECTED Final   Enterobacter cloacae complex NOT DETECTED NOT DETECTED Final   Escherichia coli NOT DETECTED NOT DETECTED Final   Klebsiella  oxytoca NOT DETECTED NOT DETECTED Final   Klebsiella pneumoniae NOT DETECTED NOT DETECTED Final   Proteus species NOT DETECTED NOT DETECTED Final   Serratia marcescens NOT DETECTED NOT DETECTED Final   Haemophilus influenzae NOT DETECTED NOT DETECTED Final   Neisseria meningitidis NOT DETECTED NOT DETECTED Final   Pseudomonas aeruginosa NOT DETECTED NOT DETECTED Final   Candida albicans NOT DETECTED NOT DETECTED Final   Candida glabrata NOT DETECTED NOT DETECTED Final   Candida krusei NOT DETECTED NOT DETECTED Final   Candida parapsilosis NOT DETECTED NOT DETECTED Final   Candida tropicalis NOT DETECTED NOT DETECTED Final    Comment: Performed at [facility], [place] [address], [place], [place] [address]    Michel Bickers, [facility] [phone] pager   [phone] cell 06/11/2018, 5:30 PM

## 2018-06-11 NOTE — Consult Note (Signed)
[facility] Surgery Consult Note  Morgan Hubbard [facility] [birth year]  [id number].    Requesting MD: Reyne Dumas Chief Complaint/Reason for Consult: acute cholecystitis  HPI:  Morgan Hubbard is an 84yo female PMH HTN, HLD, DM-II, CKD-III, h/o stroke, GERD, h/o nonischemic cardiomyopathy s/p AICD placement 2016 with improved EF 55% (06/25/17), solitary kidney, h/o breast cancer, who was admitted to [facility] yesterday with acute onset nausea and vomiting. States that she was feeling well until midday 1/7. She started having shaking chills, temp up to 103, and multiple episodes of n/v. Denies any abdominal pain, diarrhea, constipation, or recent sick contacts, but she did suffer with an upper respiratory flu-like virus during the month of December. States that her urine looks dark, but denies dysuria or urinary frequency. She reports chronic low back pain, but her pain is at baseline. Denies chest pain or worsening shortness of breath.  ED workup included u/s which shows slightly thickened appearance of the gallbladder without significant distention nor pericholecystic fluid, intraluminal biliary sludge and gallstones are identified, dilated common bile duct to 37m without choledocholithiasis. WBC 6.1, lactic acid 2.92, creatinine 1.24, BNP 220.2, AST 270, ALT 105, alk phos 316, bilirubin 3.2. Hepatitis panel pending. This morning her lactic acidosis had cleared, WBC up to 12.3, creatinine up 1.48, and no LFTs or lipase was checked. Blood cultures currently showing gram positive cocci. Of note the patient had some outpatient labs drawn which shows that her LFTs have risen since 1/7.   -Abdominal surgical history: nephrectomy, tubal ligation -Blood thinning medications: ASA 3228m-Nonsmoker -Lives at home with her husband, ambulates without assistive device majority of the time unless she knows she is going to be walking a long distance then she will use a walker  ROS: Review of Systems  Constitutional:  Positive for chills and fever.  HENT: Negative.   Eyes: Negative.   Respiratory: Positive for shortness of breath. Negative for cough and wheezing.   Cardiovascular: Negative.  Negative for chest pain and leg swelling.  Gastrointestinal: Positive for nausea and vomiting. Negative for abdominal pain, constipation and diarrhea.  Genitourinary: Negative for dysuria, frequency and urgency.       Dark urine  Musculoskeletal: Positive for back pain.  Skin: Negative.   Neurological: Negative.    All systems reviewed and otherwise negative except for as above  Family History  Problem Relation Age of Onset  . Arthritis Mother   . Cancer Mother        uterine and mouth  . Hyperlipidemia Mother   . Stroke Mother   . Hypertension Mother   . Alcohol abuse Father   . Diabetes Father   . Cancer Sister        breast  . Diabetes Sister   . Cancer Brother        lung cancer  . Kidney disease Brother   . Diabetes Brother   . Hyperlipidemia Sister   . Heart disease Sister   . Hypertension Sister   . Diabetes Sister   . Hyperlipidemia Brother   . Kidney failure Brother   . Diabetes Daughter   . Addison's disease Other     Past Medical History:  Diagnosis Date  . Arthritis    "hands" (01/13/2014)  . Basal cell carcinoma 01/2014   "bridge of nose"  . Cardiac LV ejection fraction >40%    "it was 43 last year" (01/13/2014)  . Chronic lower back pain   . CKD (chronic kidney disease), stage III ([place]  acute on chronic stage III/notes 06/10/2018  . Colon polyps   . Expressive aphasia    "3 times in the last week" (01/13/2014)  . GERD (gastroesophageal reflux disease)   . Goiter past remote   treated with RI  . Hyperlipidemia   . Hyperpotassemia   . Hypertension   . Hypertonicity of bladder   . Inflammatory and toxic neuropathy, unspecified   . LBBB (left bundle branch block)   . Migraine    "stopped many years ago" (01/13/2014)  . Mini stroke ([place]) 01/2014  . Other primary  cardiomyopathies   . Overactive bladder   . Presence of combination internal cardiac defibrillator (ICD) and pacemaker   . Reflex sympathetic dystrophy, unspecified   . Shingles   . Shortness of breath    ambulation  . Stroke ([place])   . Thyroid disease   . Type II diabetes mellitus ([place])   . Ulcer   . Urine incontinence   . Vertigo    hx of    Past Surgical History:  Procedure Laterality Date  . BACK SURGERY    . BI-VENTRICULAR PACEMAKER INSERTION (CRT-P)  10/2014   [facility]  . BREAST CYST EXCISION Left 1959  . CARDIAC CATHETERIZATION  "several"   [place]  . CATARACT EXTRACTION W/ INTRAOCULAR LENS IMPLANT Left ~ 2005   several eye injections  . COLONOSCOPY  7/12   normal (hx of polyps in past)   . CT SCAN  3/12   outside hosp- lung nodule and gallstones  . [place]   "knot on index"  . KIDNEY DONATION Left 1989  . LUMBAR LAMINECTOMY/DECOMPRESSION MICRODISCECTOMY  1999  . LUMBAR LAMINECTOMY/DECOMPRESSION MICRODISCECTOMY  01/31/2012   Procedure: LUMBAR LAMINECTOMY/DECOMPRESSION MICRODISCECTOMY 2 LEVELS;  Surgeon: Eustace Moore, MD;  Location: [facility];  Service: Neurosurgery;  Laterality: N/A;  Thoracic twelve-lumbar one, lumbar one-two laminectomy   . POSTERIOR LAMINECTOMY / DECOMPRESSION CERVICAL SPINE  1999  . TUBAL LIGATION  1976  . UPPER GASTROINTESTINAL ENDOSCOPY      Social History:  reports that she has never smoked. She has never used smokeless tobacco. She reports that she does not drink alcohol or use drugs.  Allergies:  Allergies  Allergen Reactions  . Ace Inhibitors Swelling    REACTION: tongue swelling  . Codeine Other (See Comments)    REACTION: nausea  . Hydrochlorothiazide Other (See Comments)    unknown  . Insulin Detemir Itching  . Lisinopril Swelling    Swelling of tongue  . Nsaids Other (See Comments)    Unable to take D/T kidney issues  . Tylenol [Acetaminophen] Other (See Comments)    Pt has one kidney and says she  can't take it    Medications Prior to Admission  Medication Sig Dispense Refill  . albuterol (PROVENTIL HFA;VENTOLIN HFA) 108 (90 Base) MCG/ACT inhaler Inhale 2 puffs into the lungs every 4 (four) hours as needed for wheezing. 1 Inhaler 0  . aspirin 325 MG tablet Take 325 mg by mouth daily.    Marland Kitchen atorvastatin (LIPITOR) 80 MG tablet Take 80 mg by mouth daily.    . benzonatate (TESSALON) 200 MG capsule Take 1 capsule (200 mg total) by mouth 3 (three) times daily as needed. Do not bite pill 30 capsule 1  . carvedilol (COREG) 12.5 MG tablet Take 1 tablet (12.5 mg total) by mouth 2 (two) times daily with a meal. 180 tablet 4  . cetirizine (ZYRTEC) 10 MG tablet Take 10 mg by  mouth 2 (two) times daily.    . Cholecalciferol (VITAMIN D-3) 1000 units CAPS Take 1 capsule by mouth daily.    . clotrimazole-betamethasone (LOTRISONE) cream Apply 1 application topically as needed (groin).     . dapsone 25 MG tablet Take 25 mg by mouth daily.     Marland Kitchen gabapentin (NEURONTIN) 300 MG capsule Take 1 capsule (300 mg total) by mouth 3 (three) times daily. 270 capsule 3  . glimepiride (AMARYL) 1 MG tablet Take 2 mg by mouth daily.     . hydrALAZINE (APRESOLINE) 50 MG tablet Take 1 tablet by mouth 2 (two) times daily.    . hydrochlorothiazide (HYDRODIURIL) 12.5 MG tablet Take 12.5 mg by mouth daily.    . isosorbide mononitrate (IMDUR) 30 MG 24 hr tablet Take 1 tablet (30 mg total) by mouth daily. 90 tablet 4  . letrozole (FEMARA) 2.5 MG tablet Take 2.5 mg by mouth daily.    Marland Kitchen losartan (COZAAR) 50 MG tablet Take 50 mg by mouth daily.    . metFORMIN (GLUCOPHAGE-XR) 500 MG 24 hr tablet Take 1,000 mg by mouth 2 (two) times daily.    Marland Kitchen MILK THISTLE PO Take 1 capsule by mouth 2 (two) times daily.     . mirabegron ER ([name]) 50 MG TB24 tablet Take 50 mg by mouth daily.    . nitroGLYCERIN (NITROSTAT) 0.4 MG SL tablet Place 0.4 mg under the tongue every 5 (five) minutes as needed for chest pain.    . pantoprazole (PROTONIX)  40 MG tablet Take 1 tablet (40 mg total) by mouth daily. 90 tablet 3  . promethazine-dextromethorphan (PROMETHAZINE-DM) 6.25-15 MG/5ML syrup TAKE 5 ML BY MOUTH 4 TIMES DAILY AS NEEDED FOR COUGH (CAUTION  SEDATION) (Patient taking differently: Take 5 mLs by mouth 4 (four) times daily as needed for cough. ) 118 mL 0  . traMADol (ULTRAM) 50 MG tablet Take 1-2 tablets (50-100 mg total) by mouth every 8 (eight) hours as needed for moderate pain or severe pain. 60 tablet 0  . vitamin B-12 (CYANOCOBALAMIN) 1000 MCG tablet Take 1,000 mcg by mouth daily.     Marland Kitchen glucose blood (ONE TOUCH ULTRA TEST) test strip Use 3 (three) times daily.      Prior to Admission medications   Medication Sig Start Date End Date Taking? Authorizing Provider  albuterol (PROVENTIL HFA;VENTOLIN HFA) 108 (90 Base) MCG/ACT inhaler Inhale 2 puffs into the lungs every 4 (four) hours as needed for wheezing. 05/13/18  Yes Tower, [name], MD  aspirin 325 MG tablet Take 325 mg by mouth daily.   Yes [provider]  atorvastatin (LIPITOR) 80 MG tablet Take 80 mg by mouth daily.   Yes [provider]  benzonatate (TESSALON) 200 MG capsule Take 1 capsule (200 mg total) by mouth 3 (three) times daily as needed. Do not bite pill 05/13/18  Yes Tower, [name], MD  carvedilol (COREG) 12.5 MG tablet Take 1 tablet (12.5 mg total) by mouth 2 (two) times daily with a meal. 08/03/13  Yes Gollan, Kathlene November, MD  cetirizine (ZYRTEC) 10 MG tablet Take 10 mg by mouth 2 (two) times daily.   Yes [provider]  Cholecalciferol (VITAMIN D-3) 1000 units CAPS Take 1 capsule by mouth daily. 05/10/09  Yes [provider]  clotrimazole-betamethasone (LOTRISONE) cream Apply 1 application topically as needed (groin).  06/19/15  Yes [provider]  dapsone 25 MG tablet Take 25 mg by mouth daily.    Yes [provider]  gabapentin (NEURONTIN) 300 MG capsule Take 1 capsule (300 mg total) by mouth 3 (three) times daily.  06/09/18  Yes Tower, Marne A, MD  glimepiride (AMARYL) 1 MG tablet Take 2 mg by mouth daily.    Yes [provider]  hydrALAZINE (APRESOLINE) 50 MG tablet Take 1 tablet by mouth 2 (two) times daily. 02/28/14  Yes [provider]  hydrochlorothiazide (HYDRODIURIL) 12.5 MG tablet Take 12.5 mg by mouth daily.   Yes [provider]  isosorbide mononitrate (IMDUR) 30 MG 24 hr tablet Take 1 tablet (30 mg total) by mouth daily. 08/03/13  Yes Minna Merritts, MD  letrozole [facility]) 2.5 MG tablet Take 2.5 mg by mouth daily.   Yes [provider]  losartan (COZAAR) 50 MG tablet Take 50 mg by mouth daily.   Yes [provider]  metFORMIN (GLUCOPHAGE-XR) 500 MG 24 hr tablet Take 1,000 mg by mouth 2 (two) times daily.   Yes [provider]  MILK THISTLE PO Take 1 capsule by mouth 2 (two) times daily.    Yes [provider]  mirabegron ER ([name]) 50 MG TB24 tablet Take 50 mg by mouth daily.   Yes [provider]  nitroGLYCERIN (NITROSTAT) 0.4 MG SL tablet Place 0.4 mg under the tongue every 5 (five) minutes as needed for chest pain.   Yes [provider]  pantoprazole (PROTONIX) 40 MG tablet Take 1 tablet (40 mg total) by mouth daily. 06/09/18  Yes Tower, [name], MD  promethazine-dextromethorphan (PROMETHAZINE-DM) 6.25-15 MG/5ML syrup TAKE 5 ML BY MOUTH 4 TIMES DAILY AS NEEDED FOR COUGH (CAUTION  SEDATION) Patient taking differently: Take 5 mLs by mouth 4 (four) times daily as needed for cough.  05/22/18  Yes Tower, [name], MD  traMADol (ULTRAM) 50 MG tablet Take 1-2 tablets (50-100 mg total) by mouth every 8 (eight) hours as needed for moderate pain or severe pain. 03/09/18  Yes Tower, [name], MD  vitamin B-12 (CYANOCOBALAMIN) 1000 MCG tablet Take 1,000 mcg by mouth daily.    Yes [provider]  glucose blood (ONE TOUCH ULTRA TEST) test strip Use 3 (three) times daily. 02/08/16   [provider]    Blood pressure  102/61, pulse 69, temperature 98.4 F (36.9 C), temperature source Oral, resp. rate 20, last menstrual period 06/03/1992, SpO2 94 %. Physical Exam: General: pleasant, WD/WN white female who is laying in bed in NAD HEENT: head is normocephalic, atraumatic.  Sclera are noninjected.  Pupils equal and round.  Ears and nose without any masses or lesions.  Mouth is pink and moist. Dentition fair Heart: regular, rate, and rhythm.  No obvious murmurs, gallops, or rubs noted.  Palpable pedal pulses bilaterally Lungs: CTAB, no wheezes, rhonchi, or rales noted.  Respiratory effort nonlabored Abd: well healed left lateral/flank incision, soft, ND, +BS, no masses, hernias, or organomegaly. nontender to light and deep palpation, no rebound or guarding MS: all 4 extremities are symmetrical with no cyanosis, clubbing, or edema. Skin: warm and dry with no masses, lesions, or rashes Psych: A&Ox3 with an appropriate affect. Neuro: cranial nerves grossly intact, extremity CSM intact bilaterally, normal speech  Results for orders placed or performed during the hospital encounter of 06/10/18 (from the past 48 hour(s))  Culture, blood (Routine x 2)     Status: None (Preliminary result)   Collection Time: 06/10/18  5:12 PM  Result Value Ref Range   Specimen Description SITE NOT SPECIFIED    Special Requests  BOTTLES DRAWN AEROBIC AND ANAEROBIC Blood Culture adequate volume   Culture      NO GROWTH < 24 HOURS Performed at [facility], [place] [address] [place], [place] [address]    Report Status PENDING   Comprehensive metabolic panel     Status: Abnormal   Collection Time: 06/10/18  5:13 PM  Result Value Ref Range   Sodium 143 135 - 145 mmol/L   Potassium 3.8 3.5 - 5.1 mmol/L   Chloride 104 98 - 111 mmol/L   CO2 29 22 - 32 mmol/L   Glucose, Bld 159 (H) 70 - 99 mg/dL   BUN 11 8 - 23 mg/dL   Creatinine, Ser 1.24 (H) 0.44 - 1.00 mg/dL   Calcium 9.1 8.9 - 10.3 mg/dL   Total Protein 6.2 (L) 6.5 - 8.1  g/dL   Albumin 3.0 (L) 3.5 - 5.0 g/dL   AST 270 (H) 15 - 41 U/L   ALT 105 (H) 0 - 44 U/L   Alkaline Phosphatase 316 (H) 38 - 126 U/L   Total Bilirubin 3.2 (H) 0.3 - 1.2 mg/dL   GFR calc non Af Amer 41 (L) >60 mL/min   GFR calc Af Amer 47 (L) >60 mL/min   Anion gap 10 5 - 15    Comment: Performed at [facility], [place] [address], [place], [place] [address]  CBC with Differential     Status: Abnormal   Collection Time: 06/10/18  5:13 PM  Result Value Ref Range   WBC 6.1 4.0 - 10.5 K/uL   RBC 3.63 (L) 3.87 - 5.11 MIL/uL   Hemoglobin 11.0 (L) 12.0 - 15.0 g/dL   HCT 35.8 (L) 36.0 - 46.0 %   MCV 98.6 80.0 - 100.0 fL   MCH 30.3 26.0 - 34.0 pg   MCHC 30.7 30.0 - 36.0 g/dL   RDW 13.1 11.5 - 15.5 %   Platelets 259 150 - 400 K/uL   nRBC 0.0 0.0 - 0.2 %   Neutrophils Relative % 77 %   Neutro Abs 4.6 1.7 - 7.7 K/uL   Lymphocytes Relative 14 %   Lymphs Abs 0.9 0.7 - 4.0 K/uL   Monocytes Relative 5 %   Monocytes Absolute 0.3 0.1 - 1.0 K/uL   Eosinophils Relative 4 %   Eosinophils Absolute 0.2 0.0 - 0.5 K/uL   Basophils Relative 0 %   Basophils Absolute 0.0 0.0 - 0.1 K/uL   Immature Granulocytes 0 %   Abs Immature Granulocytes 0.02 0.00 - 0.07 K/uL    Comment: Performed at [facility] [address] [place], [place] [address]  Protime-INR     Status: None   Collection Time: 06/10/18  5:13 PM  Result Value Ref Range   Prothrombin Time 12.6 11.4 - 15.2 seconds   INR 0.96     Comment: Performed at [facility] [address] [place], [place] [address]  I-Stat CG4 Lactic Acid, ED     Status: Abnormal   Collection Time: 06/10/18  5:23 PM  Result Value Ref Range   Lactic Acid, Venous 2.92 (HH) 0.5 - 1.9 mmol/L   Comment NOTIFIED PHYSICIAN   Culture, blood (Routine x 2)     Status: None (Preliminary result)   Collection Time: 06/10/18  5:38 PM  Result Value Ref Range   Specimen Description BLOOD LEFT ANTECUBITAL    Special Requests      BOTTLES DRAWN AEROBIC AND  ANAEROBIC Blood Culture results may  not be optimal due to an excessive volume of blood received in culture bottles   Culture  Setup Time      GRAM POSITIVE COCCI AEROBIC BOTTLE ONLY Organism ID to follow Performed at [facility], [place] [address], [place], [place] [address]    Culture PENDING    Report Status PENDING   Urinalysis, Routine w reflex microscopic     Status: Abnormal   Collection Time: 06/10/18  6:40 PM  Result Value Ref Range   Color, Urine AMBER (A) YELLOW    Comment: BIOCHEMICALS MAY BE AFFECTED BY COLOR   APPearance CLEAR CLEAR   Specific Gravity, Urine 1.017 1.005 - 1.030   pH 7.0 5.0 - 8.0   Glucose, UA NEGATIVE NEGATIVE mg/dL   Hgb urine dipstick NEGATIVE NEGATIVE   Bilirubin Urine NEGATIVE NEGATIVE   Ketones, ur NEGATIVE NEGATIVE mg/dL   Protein, ur 100 (A) NEGATIVE mg/dL   Nitrite NEGATIVE NEGATIVE   Leukocytes, UA NEGATIVE NEGATIVE   RBC / HPF 0-5 0 - 5 RBC/hpf   WBC, UA 0-5 0 - 5 WBC/hpf   Bacteria, UA NONE SEEN NONE SEEN   Squamous Epithelial / LPF 6-10 0 - 5   Mucus PRESENT     Comment: Performed at [facility], [place] [address], [place], [place] [address]  Influenza panel by PCR (type A & B)     Status: None   Collection Time: 06/10/18  6:40 PM  Result Value Ref Range   Influenza A By PCR NEGATIVE NEGATIVE   Influenza B By PCR NEGATIVE NEGATIVE    Comment: (NOTE) The Xpert Xpress Flu assay is intended as an aid in the diagnosis of  influenza and should not be used as a sole basis for treatment.  This  assay is FDA approved for nasopharyngeal swab specimens only. Nasal  washings and aspirates are unacceptable for Xpert Xpress Flu testing. Performed at [facility], [place] [address], [place], [place] [address]   I-Stat CG4 Lactic Acid, ED     Status: None   Collection Time: 06/10/18  8:48 PM  Result Value Ref Range   Lactic Acid, Venous 1.55 0.5 - 1.9 mmol/L  CBG monitoring, ED     Status: Abnormal   Collection Time: 06/10/18   9:57 PM  Result Value Ref Range   Glucose-Capillary 100 (H) 70 - 99 mg/dL  Brain natriuretic peptide     Status: Abnormal   Collection Time: 06/10/18 10:15 PM  Result Value Ref Range   B Natriuretic Peptide 220.2 (H) 0.0 - 100.0 pg/mL    Comment: Performed at [facility] [address] [place], [place] [address]  APTT     Status: None   Collection Time: 06/10/18 10:15 PM  Result Value Ref Range   aPTT 27 24 - 36 seconds    Comment: Performed at [facility] [address] [place], [place] [address]  Type and screen [place]     Status: None   Collection Time: 06/10/18 10:15 PM  Result Value Ref Range   ABO/RH(D) O POS    Antibody Screen NEG    Sample Expiration      06/13/2018 Performed at [facility], [place] [address] [place], [place] [address]   Lactic acid, plasma     Status: None   Collection Time: 06/10/18 10:15 PM  Result Value Ref Range   Lactic Acid, Venous 1.7 0.5 - 1.9 mmol/L  Comment: Performed at [facility], [place] [address], [place], [place] [address]  Procalcitonin     Status: None   Collection Time: 06/10/18 10:15 PM  Result Value Ref Range   Procalcitonin 7.41 ng/mL    Comment:        Interpretation: PCT > 2 ng/mL: Systemic infection (sepsis) is likely, unless other causes are known. (NOTE)       Sepsis PCT Algorithm           Lower Respiratory Tract                                      Infection PCT Algorithm    ----------------------------     ----------------------------         PCT < 0.25 ng/mL                PCT < 0.10 ng/mL         Strongly encourage             Strongly discourage   discontinuation of antibiotics    initiation of antibiotics    ----------------------------     -----------------------------       PCT 0.25 - 0.50 ng/mL            PCT 0.10 - 0.25 ng/mL               OR       >80% decrease in PCT            Discourage initiation of                                             antibiotics      Encourage discontinuation           of antibiotics    ----------------------------     -----------------------------         PCT >= 0.50 ng/mL              PCT 0.26 - 0.50 ng/mL               AND       <80% decrease in PCT              Encourage initiation of                                             antibiotics       Encourage continuation           of antibiotics    ----------------------------     -----------------------------        PCT >= 0.50 ng/mL                  PCT > 0.50 ng/mL               AND         increase in PCT                  Strongly encourage  initiation of antibiotics    Strongly encourage escalation           of antibiotics                                     -----------------------------                                           PCT <= 0.25 ng/mL                                                 OR                                        > 80% decrease in PCT                                     Discontinue / Do not initiate                                             antibiotics Performed at [facility], [address], [place], [place] [address]   ABO/Rh     Status: None   Collection Time: 06/10/18 10:15 PM  Result Value Ref Range   ABO/RH(D)      O POS Performed at [facility] [address], [place], [place] [address]   Basic metabolic panel     Status: Abnormal   Collection Time: 06/11/18  2:21 AM  Result Value Ref Range   Sodium 141 135 - 145 mmol/L   Potassium 4.2 3.5 - 5.1 mmol/L   Chloride 102 98 - 111 mmol/L   CO2 29 22 - 32 mmol/L   Glucose, Bld 198 (H) 70 - 99 mg/dL   BUN 15 8 - 23 mg/dL   Creatinine, Ser 1.48 (H) 0.44 - 1.00 mg/dL   Calcium 8.3 (L) 8.9 - 10.3 mg/dL   GFR calc non Af Amer 33 (L) >60 mL/min   GFR calc Af Amer 38 (L) >60 mL/min   Anion gap 10 5 - 15    Comment: Performed at [facility], [place] [address], [place], [place] [address]  CBC     Status:  Abnormal   Collection Time: 06/11/18  2:21 AM  Result Value Ref Range   WBC 12.3 (H) 4.0 - 10.5 K/uL   RBC 3.17 (L) 3.87 - 5.11 MIL/uL   Hemoglobin 9.7 (L) 12.0 - 15.0 g/dL   HCT 31.7 (L) 36.0 - 46.0 %   MCV 100.0 80.0 - 100.0 fL   MCH 30.6 26.0 - 34.0 pg   MCHC 30.6 30.0 - 36.0 g/dL   RDW 13.5 11.5 - 15.5 %   Platelets 231 150 - 400 K/uL   nRBC 0.0 0.0 - 0.2 %    Comment: Performed at [facility], [place],  [place] [address]  Lactic acid, plasma     Status: Abnormal   Collection Time: 06/11/18  2:21 AM  Result Value Ref Range   Lactic Acid, Venous 2.2 (HH) 0.5 - 1.9 mmol/L    Comment: CRITICAL RESULT CALLED TO, READ BACK BY AND VERIFIED WITH: [facility] RN 06/11/2018 0348 JORDANS Performed at [facility], [place] [address], [place],  [address]   CBG monitoring, ED     Status: Abnormal   Collection Time: 06/11/18  7:05 AM  Result Value Ref Range   Glucose-Capillary 208 (H) 70 - 99 mg/dL   Comment 1 Notify RN    Comment 2 Repeat Test    Comment 3 Document in Chart   CBG monitoring, ED     Status: Abnormal   Collection Time: 06/11/18 11:47 AM  Result Value Ref Range   Glucose-Capillary 110 (H) 70 - 99 mg/dL   Comment 1 Notify RN    Comment 2 Document in Chart    Dg Chest 2 View  Result Date: 06/10/2018 CLINICAL DATA:  Chills, headache, and body aches all day. Diagnosed with the flu. EXAM: CHEST - 2 VIEW COMPARISON:  05/13/2018. FINDINGS: Stable hyperinflation. LEFT AICD unchanged. Cardiomegaly. Thoracic atherosclerosis. Clear lung fields. Straightening of the normal thoracic kyphosis. No worrisome osseous lesion. Stable appearance from priors. IMPRESSION: No active cardiopulmonary disease. Electronically Signed   By: Staci Righter M.D.   On: 06/10/2018 18:24   US Abdomen Limited  Result Date: 06/10/2018 CLINICAL DATA:  Fever, abnormal liver function tests and heartburn. Known gallstones. EXAM: ULTRASOUND ABDOMEN LIMITED RIGHT UPPER QUADRANT  COMPARISON:  None. FINDINGS: Gallbladder: Mild single wall thickness of the gallbladder is suggested to 5 mm, normal at 3 mm or less. The gallbladder is physiologically distended without pathologic distention however. There also appear to be echogenic shadowing gallstones up to 1.3 cm near the neck of the gallbladder admixed with sludge. Sonographer indicates no sonographic Murphy's sign. Common bile duct: Diameter: 10 mm without choledocholithiasis. Liver: No focal lesion identified. Within normal limits in parenchymal echogenicity. Portal vein is patent on color Doppler imaging with normal direction of blood flow towards the liver. IMPRESSION: 1. Slightly thickened appearance of the gallbladder without significant distention nor pericholecystic fluid. Intraluminal biliary sludge and gallstones are identified. Findings may be secondary to a chronic cholecystitis. HIDA scan may help for further assessment. 2. Dilated common bowel duct to 10 mm without choledocholithiasis. There is a slightly above expected for age. Recently passed stone may contribute to this appearance. Electronically Signed   By: Ashley Royalty M.D.   On: 06/10/2018 19:40   Anti-infectives (From admission, onward)   Start     Dose/Rate Route Frequency Ordered Stop   06/11/18 0600  piperacillin-tazobactam (ZOSYN) IVPB 3.375 g     3.375 g 12.5 mL/hr over 240 Minutes Intravenous Every 12 hours 06/10/18 2124     06/10/18 2300  dapsone tablet 25 mg     25 mg Oral Daily 06/10/18 2257     06/10/18 2115  dapsone tablet 25 mg  Status:  Discontinued     25 mg Oral Daily 06/10/18 2112 06/10/18 2257   06/10/18 1930  metroNIDAZOLE (FLAGYL) IVPB 500 mg     500 mg 100 mL/hr over 60 Minutes Intravenous  Once 06/10/18 1924 06/10/18 2153   06/10/18 1800  ceFEPIme (MAXIPIME) 2 g in sodium chloride 0.9 % 100 mL IVPB     2 g 200 mL/hr over 30 Minutes Intravenous  Once 06/10/18 1746  06/10/18 2000        Assessment/Plan HTN HLD DM-II CKD-III H/o  stroke GERD H/o nonischemic cardiomyopathy s/p AICD placement 2016 with improved EF 55% (06/25/17) - followed by Dr. Nancy Fetter [facility] Solitary kidney - donated kidney to her brother in 1989 H/o breast cancer s/p lumpectomy at [facility] Chronic back pain - takes tramadol PRN (not daily)  Nausea and vomiting Fever Elevated LFTs Cholelithiasis, ?acute cholecystitis Gram positive bacteremia - Patient does have elevated LFTs with bilirubin up to 3.2, but she has no abdominal pain. Will order HIDA scan to evaluate for acute cholecystitis. Recheck LFTs and check a lipase. Continue IV zosyn.   ID - maxipime/flagyl 1/8, zosyn 1/9>> VTE - SCDs, sq heparin FEN - IVF, CLD, NPO after MN Foley - none   [place]ellington [place], [facility] 06/11/2018, 2:51 PM Pager: [phone] Mon 7:00 am -11:30 AM Tues-Fri 7:00 am-4:30 pm Sat-Sun 7:00 am-11:30 am

## 2018-06-11 NOTE — Progress Notes (Addendum)
[facility] PROGRESS NOTE  Morgan Hubbard ION:[id number] DOB: [birth year] DOA: 06/10/2018   PCP: Abner Greenspan, MD     Assessment/Plan: Principal Problem:   Abdominal pain Active Problems:   Type II diabetes mellitus with renal manifestations ([place])   HYPERCHOLESTEROLEMIA   Essential hypertension   Gallstones   Stroke, small vessel (HCC)   Chronic systolic CHF (congestive heart failure) (HCC)   Sepsis (HCC)   Abnormal LFTs   GERD (gastroesophageal reflux disease)   Acute renal failure superimposed on stage 3 chronic kidney disease (HCC)   Cholecystitis   84 y.o. female with medical history significant of hypertension, hyperlipidemia, diabetes mellitus, stroke, GERD, left bundle blockage, migraine headache, AICD placement, vertigo, solitary kidney, CHF with EF of >55% in 1/19, breast cancer (lumpectomy), who presents with fever, chills, abdominal pain, nausea and vomiting.  Admitted with suspicion for acute cholecystitis.  Assessment and plan  Abdominal pain and sepsis due to possible cholecystitis and gallstone: Patient meets critical for sepsis with fever, tachypnea.  Her lactic acid is elevated 2.93.  Currently hemodynamically stable.  General surgeon, Dr. Brantley Stage is consulted by EDP. - continue telemetry -continue: Zosyn (patient received 1 dose of cefepime and Flagyl in ED) -Blood culture positive for gram positive cocci , will add iv vanc , source unclear , confirm that it is a true positive before ID consult  - Procalcitonin  7.4   -IVF:  Continue aggressive IV fluids treatment). Will give small bolus as needed depending on lactic acid levels -PRN morphine for pain and Zofran for nausea -INR/PTT/type screen -HIDA scan in am , IR consult if deemed not a surgical candidate  Clear liquid as she is minimally sx , NPO after midnight   Gram positive cocci bacteremia  She had a pacemake , we will add vanc Wait for final report and consider ID consult /TEE if truly  positive  Could be a contaminant , so pending final report   She had a viral PNA and was on abx until 12/25  She has had no pulmonary sx, CXR negative No recent  Joint or back injections    Type II diabetes mellitus with renal manifestations ([place]): Last A1c 7.7 on 05/02/18, not well controled. Patient is taking metformin and Amaryl at home -SSI  HYPERCHOLESTEROLEMIA: -hold Lipitor due to abnormal liver function  Essential hypertension: Blood pressure 129/51 -hold Cozaar due to worsening renal function - Hold oral hydralazine since patient at the risk of developing hypotension due to sepsis -Continue Coreg -IV hydralazine as needed  Hx of Stroke, small vessel ([place]): -hold lipitor as above -on prn ASA (hold home ASA)  Chronic systolic CHF (congestive heart failure) ([place]):  Abnormal LFTs: likely due to gallstone -hold lipitor -avoid using tylenol -hepatitis panel  GERD (gastroesophageal reflux disease): -protonix  AoCKD-III: Baseline Cre is ~1.9, pt's Cre is 1.2 and BUN 11 on admission. Likely due todehydration and continuation of ARB - IVF as above - Follow up renal function by BMP    DVT prophylaxsis  SCDs  Code Status:  Full code        Family Communication: Discussed in detail with the patient, all imaging results, lab results explained to the patient   Disposition Plan:   Pending general surgery consultation      Consultants:  General surgery  Procedures:  none  Antibiotics: Anti-infectives (From admission, onward)   Start     Dose/Rate Route Frequency Ordered Stop   06/11/18 0600  piperacillin-tazobactam (ZOSYN) IVPB 3.375 g     3.375 g 12.5 mL/hr over 240 Minutes Intravenous Every 12 hours 06/10/18 2124     06/10/18 2300  dapsone tablet 25 mg     25 mg Oral Daily 06/10/18 2257     06/10/18 2115  dapsone tablet 25 mg  Status:  Discontinued     25 mg Oral Daily 06/10/18 2112 06/10/18 2257   06/10/18 1930  metroNIDAZOLE (FLAGYL) IVPB  500 mg     500 mg 100 mL/hr over 60 Minutes Intravenous  Once 06/10/18 1924 06/10/18 2153   06/10/18 1800  ceFEPIme (MAXIPIME) 2 g in sodium chloride 0.9 % 100 mL IVPB     2 g 200 mL/hr over 30 Minutes Intravenous  Once 06/10/18 1746 06/10/18 2000         HPI/Subjective: She has some left flank discomfort , but no epigastric pain, nausea and heart burn have improved   Objective: Vitals:   06/11/18 0915 06/11/18 1115 06/11/18 1215 06/11/18 1321  BP: 133/63 (!) 117/50 (!) 120/40 102/61  Pulse: 74 69 71 69  Resp: (!) 22 19 20    Temp:  99.4 F (37.4 C)  98.4 F (36.9 C)  TempSrc:  Oral  Oral  SpO2: 98% 97% 99% 94%    Intake/Output Summary (Last 24 hours) at 06/11/2018 1424 Last data filed at 06/11/2018 7322 Gross per 24 hour  Intake 557.03 ml  Output -  Net 557.03 ml    Exam:  Examination:  General exam: Appears calm and comfortable  Respiratory system: Clear to auscultation. Respiratory effort normal. Cardiovascular system: S1 & S2 heard, RRR. No JVD, murmurs, rubs, gallops or clicks. No pedal edema. Gastrointestinal system: Abdomen is nondistended, soft and nontender. No organomegaly or masses felt. Normal bowel sounds heard. Left flank scar  Central nervous system: Alert and oriented. No focal neurological deficits. Extremities: Symmetric 5 x 5 power. Skin: No rashes, lesions or ulcers Psychiatry: Judgement and insight appear normal. Mood & affect appropriate.     Data Reviewed: I have personally reviewed following labs and imaging studies  Micro Results Recent Results (from the past 240 hour(s))  Culture, blood (Routine x 2)     Status: None (Preliminary result)   Collection Time: 06/10/18  5:12 PM  Result Value Ref Range Status   Specimen Description SITE NOT SPECIFIED  Final   Special Requests   Final    BOTTLES DRAWN AEROBIC AND ANAEROBIC Blood Culture adequate volume   Culture   Final    NO GROWTH < 24 HOURS Performed at [facility], [address], [place], [place] [address]    Report Status PENDING  Incomplete  Culture, blood (Routine x 2)     Status: None (Preliminary result)   Collection Time: 06/10/18  5:38 PM  Result Value Ref Range Status   Specimen Description BLOOD LEFT ANTECUBITAL  Final   Special Requests   Final    BOTTLES DRAWN AEROBIC AND ANAEROBIC Blood Culture results may not be optimal due to an excessive volume of blood received in culture bottles   Culture   Final    NO GROWTH < 24 HOURS Performed at [facility], [place] [address], [place], [place] [address]    Report Status PENDING  Incomplete    Radiology Reports Dg Chest 2 View  Result Date: 06/10/2018 CLINICAL DATA:  Chills, headache, and body aches all day. Diagnosed with the flu. EXAM: CHEST - 2 VIEW COMPARISON:  05/13/2018.  FINDINGS: Stable hyperinflation. LEFT AICD unchanged. Cardiomegaly. Thoracic atherosclerosis. Clear lung fields. Straightening of the normal thoracic kyphosis. No worrisome osseous lesion. Stable appearance from priors. IMPRESSION: No active cardiopulmonary disease. Electronically Signed   By: Staci Righter M.D.   On: 06/10/2018 18:24   Dg Chest 2 View  Result Date: 05/13/2018 CLINICAL DATA:  Productive cough, wheezing, hypoxia EXAM: CHEST - 2 VIEW COMPARISON:  02/16/2016 FINDINGS: Stable hyperinflation. Left AICD is unchanged. Cardiomegaly. No confluent airspace opacities or effusions. No acute bony abnormality. IMPRESSION: Cardiomegaly, hyperinflation.  No active disease. Electronically Signed   By: Rolm Baptise M.D.   On: 05/13/2018 10:37   US Abdomen Limited  Result Date: 06/10/2018 CLINICAL DATA:  Fever, abnormal liver function tests and heartburn. Known gallstones. EXAM: ULTRASOUND ABDOMEN LIMITED RIGHT UPPER QUADRANT COMPARISON:  None. FINDINGS: Gallbladder: Mild single wall thickness of the gallbladder is suggested to 5 mm, normal at 3 mm or less. The gallbladder is physiologically distended without pathologic distention  however. There also appear to be echogenic shadowing gallstones up to 1.3 cm near the neck of the gallbladder admixed with sludge. Sonographer indicates no sonographic Murphy's sign. Common bile duct: Diameter: 10 mm without choledocholithiasis. Liver: No focal lesion identified. Within normal limits in parenchymal echogenicity. Portal vein is patent on color Doppler imaging with normal direction of blood flow towards the liver. IMPRESSION: 1. Slightly thickened appearance of the gallbladder without significant distention nor pericholecystic fluid. Intraluminal biliary sludge and gallstones are identified. Findings may be secondary to a chronic cholecystitis. HIDA scan may help for further assessment. 2. Dilated common bowel duct to 10 mm without choledocholithiasis. There is a slightly above expected for age. Recently passed stone may contribute to this appearance. Electronically Signed   By: Ashley Royalty M.D.   On: 06/10/2018 19:40     CBC Recent Labs  Lab 06/09/18 1218 06/10/18 1713 06/11/18 0221  WBC 4.9 6.1 12.3*  HGB 11.4* 11.0* 9.7*  HCT 34.9* 35.8* 31.7*  PLT 281.0 259 231  MCV 96.9 98.6 100.0  MCH  --  30.3 30.6  MCHC 32.6 30.7 30.6  RDW 13.9 13.1 13.5  LYMPHSABS 1.6 0.9  --   MONOABS 0.4 0.3  --   EOSABS 0.5 0.2  --   BASOSABS 0.0 0.0  --     Chemistries  Recent Labs  Lab 06/09/18 1218 06/10/18 1713 06/11/18 0221  NA  --  143 141  K  --  3.8 4.2  CL  --  104 102  CO2  --  29 29  GLUCOSE  --  159* 198*  BUN  --  11 15  CREATININE  --  1.24* 1.48*  CALCIUM  --  9.1 8.3*  AST 29 270*  --   ALT 41* 105*  --   ALKPHOS 255* 316*  --   BILITOT 1.0 3.2*  --    ------------------------------------------------------------------------------------------------------------------ estimated creatinine clearance is 25.3 mL/min (A) (by C-G formula based on SCr of 1.48 mg/dL  (H)). ------------------------------------------------------------------------------------------------------------------ No results for input(s): HGBA1C in the last 72 hours. ------------------------------------------------------------------------------------------------------------------ Recent Labs    06/09/18 1218  CHOL 121  HDL 33.90*  LDLCALC 54  TRIG 164.0*  CHOLHDL 4   ------------------------------------------------------------------------------------------------------------------ Recent Labs    06/09/18 1218  TSH 2.60   ------------------------------------------------------------------------------------------------------------------ No results for input(s): VITAMINB12, FOLATE, FERRITIN, TIBC, IRON, RETICCTPCT in the last 72 hours.  Coagulation profile Recent Labs  Lab 06/10/18 1713  INR 0.96    No results for input(s): DDIMER in  the last 72 hours.  Cardiac Enzymes No results for input(s): CKMB, TROPONINI, MYOGLOBIN in the last 168 hours.  Invalid input(s): CK ------------------------------------------------------------------------------------------------------------------ Invalid input(s): POCBNP   CBG: Recent Labs  Lab 06/10/18 2157 06/11/18 0705 06/11/18 1147  GLUCAP 100* 208* 110*       Studies: Dg Chest 2 View  Result Date: 06/10/2018 CLINICAL DATA:  Chills, headache, and body aches all day. Diagnosed with the flu. EXAM: CHEST - 2 VIEW COMPARISON:  05/13/2018. FINDINGS: Stable hyperinflation. LEFT AICD unchanged. Cardiomegaly. Thoracic atherosclerosis. Clear lung fields. Straightening of the normal thoracic kyphosis. No worrisome osseous lesion. Stable appearance from priors. IMPRESSION: No active cardiopulmonary disease. Electronically Signed   By: Staci Righter M.D.   On: 06/10/2018 18:24   US Abdomen Limited  Result Date: 06/10/2018 CLINICAL DATA:  Fever, abnormal liver function tests and heartburn. Known gallstones. EXAM: ULTRASOUND ABDOMEN  LIMITED RIGHT UPPER QUADRANT COMPARISON:  None. FINDINGS: Gallbladder: Mild single wall thickness of the gallbladder is suggested to 5 mm, normal at 3 mm or less. The gallbladder is physiologically distended without pathologic distention however. There also appear to be echogenic shadowing gallstones up to 1.3 cm near the neck of the gallbladder admixed with sludge. Sonographer indicates no sonographic Murphy's sign. Common bile duct: Diameter: 10 mm without choledocholithiasis. Liver: No focal lesion identified. Within normal limits in parenchymal echogenicity. Portal vein is patent on color Doppler imaging with normal direction of blood flow towards the liver. IMPRESSION: 1. Slightly thickened appearance of the gallbladder without significant distention nor pericholecystic fluid. Intraluminal biliary sludge and gallstones are identified. Findings may be secondary to a chronic cholecystitis. HIDA scan may help for further assessment. 2. Dilated common bowel duct to 10 mm without choledocholithiasis. There is a slightly above expected for age. Recently passed stone may contribute to this appearance. Electronically Signed   By: Ashley Royalty M.D.   On: 06/10/2018 19:40      Lab Results  Component Value Date   HGBA1C 7.7 (H) 05/02/2017   HGBA1C 9.0 (H) 04/22/2016   HGBA1C 8.9 (H) 01/14/2014   Lab Results  Component Value Date   MICROALBUR 78.0 (H) 04/22/2016   LDLCALC 54 06/09/2018   CREATININE 1.48 (H) 06/11/2018       Scheduled Meds: . carvedilol  12.5 mg Oral BID WC  . cholecalciferol  1,000 Units Oral Daily  . dapsone  25 mg Oral Daily  . gabapentin  300 mg Oral TID  . insulin aspart  0-5 Units Subcutaneous QHS  . insulin aspart  0-9 Units Subcutaneous TID WC  . isosorbide mononitrate  30 mg Oral Daily  . letrozole  2.5 mg Oral Daily  . loratadine  10 mg Oral Daily  .  morphine injection  4 mg Intravenous Once  . ondansetron (ZOFRAN) IV  4 mg Intravenous Once  . pantoprazole  40 mg  Oral Daily  . vitamin B-12  1,000 mcg Oral Daily   Continuous Infusions: . sodium chloride 50 mL/hr at 06/11/18 0943  . piperacillin-tazobactam (ZOSYN)  IV Stopped (06/11/18 0923)     LOS: 1 day    Time spent: >30 MINS    Reyne Dumas  [facility] Pager [phone]. If 7PM-7AM, please contact night-coverage at [email or web address], password [facility] 06/11/2018, 2:24 PM  LOS: 1 day

## 2018-06-11 NOTE — H&P (View-Only) (Signed)
Reason for Consult: ? Choledocholithiasis Referring Physician: CCS  Deneise Lever HPI: This is an 84 year old female with a PMH of HTN, DM, CVA, CKD, nonischemic cardiomyopathy with an improved EF or 55%, and s/p AICD placement admitted for acute nausea, vomiting, and fever.  During the month of December she was struggling with a viral infection, but she was able to recover.  She was feeling well until 06/09/2018 where she started to have her symptoms of nausea, vomiting, and shaking chills.  A temperature was obtained and it was at 103.  In the ER her liver enzymes were noted to be elevated, first her AP was at 255 on 1/7 and then there was a concomitant rise in the AST and ALT, 270 and 105, respectively.  Her AP also peaked to 316.  Her liver enzymes have started to decline but her TB has increased to 4.3.  An abdominal ultrasound shows a mildly thickened gallbladder filled with stones and sludge.  The largest stone measures 1.3 cm.  Her CBD is dilated at 10 mm.  An enteroccocal bacteremia was detected with the blood culture and she was started on Zosyn at the outset.  Past Medical History:  Diagnosis Date  . Arthritis    "hands" (01/13/2014)  . Basal cell carcinoma 01/2014   "bridge of nose"  . Cardiac LV ejection fraction >40%    "it was 43 last year" (01/13/2014)  . Chronic lower back pain   . CKD (chronic kidney disease), stage III (HCC)    acute on chronic stage III/notes 06/10/2018  . Colon polyps   . Expressive aphasia    "3 times in the last week" (01/13/2014)  . GERD (gastroesophageal reflux disease)   . Goiter past remote   treated with RI  . Hyperlipidemia   . Hyperpotassemia   . Hypertension   . Hypertonicity of bladder   . Inflammatory and toxic neuropathy, unspecified   . LBBB (left bundle branch block)   . Migraine    "stopped many years ago" (01/13/2014)  . Mini stroke ([place]) 01/2014  . Other primary cardiomyopathies   . Overactive bladder   . Presence of combination internal  cardiac defibrillator (ICD) and pacemaker   . Reflex sympathetic dystrophy, unspecified   . Shingles   . Shortness of breath    ambulation  . Stroke ([place])   . Thyroid disease   . Type II diabetes mellitus ([place])   . Ulcer   . Urine incontinence   . Vertigo    hx of    Past Surgical History:  Procedure Laterality Date  . BACK SURGERY    . BI-VENTRICULAR PACEMAKER INSERTION (CRT-P)  10/2014   [facility]  . BREAST CYST EXCISION Left 1959  . CARDIAC CATHETERIZATION  "several"   [place]  . CATARACT EXTRACTION W/ INTRAOCULAR LENS IMPLANT Left ~ 2005   several eye injections  . COLONOSCOPY  7/12   normal (hx of polyps in past)   . CT SCAN  3/12   outside hosp- lung nodule and gallstones  . [place]   "knot on index"  . KIDNEY DONATION Left 1989  . LUMBAR LAMINECTOMY/DECOMPRESSION MICRODISCECTOMY  1999  . LUMBAR LAMINECTOMY/DECOMPRESSION MICRODISCECTOMY  01/31/2012   Procedure: LUMBAR LAMINECTOMY/DECOMPRESSION MICRODISCECTOMY 2 LEVELS;  Surgeon: Eustace Moore, MD;  Location: [place] [facility];  Service: Neurosurgery;  Laterality: N/A;  Thoracic twelve-lumbar one, lumbar one-two laminectomy   . POSTERIOR LAMINECTOMY / DECOMPRESSION CERVICAL SPINE  1999  .  TUBAL LIGATION  1976  . UPPER GASTROINTESTINAL ENDOSCOPY      Family History  Problem Relation Age of Onset  . Arthritis Mother   . Cancer Mother        uterine and mouth  . Hyperlipidemia Mother   . Stroke Mother   . Hypertension Mother   . Alcohol abuse Father   . Diabetes Father   . Cancer Sister        breast  . Diabetes Sister   . Cancer Brother        lung cancer  . Kidney disease Brother   . Diabetes Brother   . Hyperlipidemia Sister   . Heart disease Sister   . Hypertension Sister   . Diabetes Sister   . Hyperlipidemia Brother   . Kidney failure Brother   . Diabetes Daughter   . Addison's disease Other     Social History:  reports that she has never smoked. She has never used smokeless  tobacco. She reports that she does not drink alcohol or use drugs.  Allergies:  Allergies  Allergen Reactions  . Ace Inhibitors Swelling    REACTION: tongue swelling  . Codeine Other (See Comments)    REACTION: nausea  . Hydrochlorothiazide Other (See Comments)    unknown  . Insulin Detemir Itching  . Lisinopril Swelling    Swelling of tongue  . Nsaids Other (See Comments)    Unable to take D/T kidney issues  . Tylenol [Acetaminophen] Other (See Comments)    Pt has one kidney and says she can't take it    Medications:  Scheduled: . carvedilol  12.5 mg Oral BID WC  . cholecalciferol  1,000 Units Oral Daily  . dapsone  25 mg Oral Daily  . gabapentin  300 mg Oral TID  . heparin injection (subcutaneous)  5,000 Units Subcutaneous Q8H  . insulin aspart  0-5 Units Subcutaneous QHS  . insulin aspart  0-9 Units Subcutaneous TID WC  . isosorbide mononitrate  30 mg Oral Daily  . letrozole  2.5 mg Oral Daily  . loratadine  10 mg Oral Daily  .  morphine injection  4 mg Intravenous Once  . ondansetron (ZOFRAN) IV  4 mg Intravenous Once  . pantoprazole  40 mg Oral Daily  . vitamin B-12  1,000 mcg Oral Daily   Continuous: . sodium chloride 125 mL/hr at 06/11/18 1741  . piperacillin-tazobactam (ZOSYN)  IV 3.375 g (06/11/18 1726)    Results for orders placed or performed during the hospital encounter of 06/10/18 (from the past 24 hour(s))  I-Stat CG4 Lactic Acid, ED     Status: None   Collection Time: 06/10/18  8:48 PM  Result Value Ref Range   Lactic Acid, Venous 1.55 0.5 - 1.9 mmol/L  CBG monitoring, ED     Status: Abnormal   Collection Time: 06/10/18  9:57 PM  Result Value Ref Range   Glucose-Capillary 100 (H) 70 - 99 mg/dL  Brain natriuretic peptide     Status: Abnormal   Collection Time: 06/10/18 10:15 PM  Result Value Ref Range   B Natriuretic Peptide 220.2 (H) 0.0 - 100.0 pg/mL  APTT     Status: None   Collection Time: 06/10/18 10:15 PM  Result Value Ref Range   aPTT 27  24 - 36 seconds  Type and screen [place]     Status: None   Collection Time: 06/10/18 10:15 PM  Result Value Ref Range   ABO/RH(D) O POS  Antibody Screen NEG    Sample Expiration      06/13/2018 Performed at [facility], [place] [address], [place], [place] [address]   Lactic acid, plasma     Status: None   Collection Time: 06/10/18 10:15 PM  Result Value Ref Range   Lactic Acid, Venous 1.7 0.5 - 1.9 mmol/L  Procalcitonin     Status: None   Collection Time: 06/10/18 10:15 PM  Result Value Ref Range   Procalcitonin 7.41 ng/mL  ABO/Rh     Status: None   Collection Time: 06/10/18 10:15 PM  Result Value Ref Range   ABO/RH(D)      O POS Performed at [facility] [address] [place], [place] [address]   Basic metabolic panel     Status: Abnormal   Collection Time: 06/11/18  2:21 AM  Result Value Ref Range   Sodium 141 135 - 145 mmol/L   Potassium 4.2 3.5 - 5.1 mmol/L   Chloride 102 98 - 111 mmol/L   CO2 29 22 - 32 mmol/L   Glucose, Bld 198 (H) 70 - 99 mg/dL   BUN 15 8 - 23 mg/dL   Creatinine, Ser 1.48 (H) 0.44 - 1.00 mg/dL   Calcium 8.3 (L) 8.9 - 10.3 mg/dL   GFR calc non Af Amer 33 (L) >60 mL/min   GFR calc Af Amer 38 (L) >60 mL/min   Anion gap 10 5 - 15  CBC     Status: Abnormal   Collection Time: 06/11/18  2:21 AM  Result Value Ref Range   WBC 12.3 (H) 4.0 - 10.5 K/uL   RBC 3.17 (L) 3.87 - 5.11 MIL/uL   Hemoglobin 9.7 (L) 12.0 - 15.0 g/dL   HCT 31.7 (L) 36.0 - 46.0 %   MCV 100.0 80.0 - 100.0 fL   MCH 30.6 26.0 - 34.0 pg   MCHC 30.6 30.0 - 36.0 g/dL   RDW 13.5 11.5 - 15.5 %   Platelets 231 150 - 400 K/uL   nRBC 0.0 0.0 - 0.2 %  Lactic acid, plasma     Status: Abnormal   Collection Time: 06/11/18  2:21 AM  Result Value Ref Range   Lactic Acid, Venous 2.2 (HH) 0.5 - 1.9 mmol/L  Hepatic function panel     Status: Abnormal   Collection Time: 06/11/18  2:21 AM  Result Value Ref Range   Total Protein 5.5 (L) 6.5 - 8.1 g/dL    Albumin 2.7 (L) 3.5 - 5.0 g/dL   AST 264 (H) 15 - 41 U/L   ALT 128 (H) 0 - 44 U/L   Alkaline Phosphatase 256 (H) 38 - 126 U/L   Total Bilirubin 4.3 (H) 0.3 - 1.2 mg/dL   Bilirubin, Direct 2.7 (H) 0.0 - 0.2 mg/dL   Indirect Bilirubin 1.6 (H) 0.3 - 0.9 mg/dL  Lipase, blood     Status: None   Collection Time: 06/11/18  2:21 AM  Result Value Ref Range   Lipase 21 11 - 51 U/L  CBG monitoring, ED     Status: Abnormal   Collection Time: 06/11/18  7:05 AM  Result Value Ref Range   Glucose-Capillary 208 (H) 70 - 99 mg/dL   Comment 1 Notify RN    Comment 2 Repeat Test    Comment 3 Document in Chart   CBG monitoring, ED     Status: Abnormal   Collection Time: 06/11/18 11:47 AM  Result Value Ref Range   Glucose-Capillary 110 (  H) 70 - 99 mg/dL   Comment 1 Notify RN    Comment 2 Document in Chart   Glucose, capillary     Status: Abnormal   Collection Time: 06/11/18  5:14 PM  Result Value Ref Range   Glucose-Capillary 55 (L) 70 - 99 mg/dL  Glucose, capillary     Status: None   Collection Time: 06/11/18  5:46 PM  Result Value Ref Range   Glucose-Capillary 79 70 - 99 mg/dL     Dg Chest 2 View  Result Date: 06/10/2018 CLINICAL DATA:  Chills, headache, and body aches all day. Diagnosed with the flu. EXAM: CHEST - 2 VIEW COMPARISON:  05/13/2018. FINDINGS: Stable hyperinflation. LEFT AICD unchanged. Cardiomegaly. Thoracic atherosclerosis. Clear lung fields. Straightening of the normal thoracic kyphosis. No worrisome osseous lesion. Stable appearance from priors. IMPRESSION: No active cardiopulmonary disease. Electronically Signed   By: Staci Righter M.D.   On: 06/10/2018 18:24   US Abdomen Limited  Result Date: 06/10/2018 CLINICAL DATA:  Fever, abnormal liver function tests and heartburn. Known gallstones. EXAM: ULTRASOUND ABDOMEN LIMITED RIGHT UPPER QUADRANT COMPARISON:  None. FINDINGS: Gallbladder: Mild single wall thickness of the gallbladder is suggested to 5 mm, normal at 3 mm or less. The  gallbladder is physiologically distended without pathologic distention however. There also appear to be echogenic shadowing gallstones up to 1.3 cm near the neck of the gallbladder admixed with sludge. Sonographer indicates no sonographic Murphy's sign. Common bile duct: Diameter: 10 mm without choledocholithiasis. Liver: No focal lesion identified. Within normal limits in parenchymal echogenicity. Portal vein is patent on color Doppler imaging with normal direction of blood flow towards the liver. IMPRESSION: 1. Slightly thickened appearance of the gallbladder without significant distention nor pericholecystic fluid. Intraluminal biliary sludge and gallstones are identified. Findings may be secondary to a chronic cholecystitis. HIDA scan may help for further assessment. 2. Dilated common bowel duct to 10 mm without choledocholithiasis. There is a slightly above expected for age. Recently passed stone may contribute to this appearance. Electronically Signed   By: Ashley Royalty M.D.   On: 06/10/2018 19:40    ROS:  As stated above in the HPI otherwise negative.  Blood pressure 102/61, pulse 69, temperature 98.4 F (36.9 C), temperature source Oral, resp. rate 20, last menstrual period 06/03/1992, SpO2 94 %.    PE: Gen: NAD, Alert and Oriented HEENT:  [place]/AT, EOMI Neck: Supple, no LAD Lungs: CTA Bilaterally CV: RRR without M/G/R ABM: Soft, NTND, +BS Ext: No C/C/E  Assessment/Plan: 1) ? Choledocholithiasis. 2) Dilated CBD. 3) Enterococcal bacteremia.   The patient only has one kidney and she has an AICD.  This precludes both a CT scan with contrast and a MRCP.  The liver enzymes are consistent with an obstructive etiology, but it is unclear if she has passed a stone.    Plan: 1) ERCP with stone extraction tomorrow. 2) Hold heparin.  Johnmark Geiger D 06/11/2018, 8:33 PM

## 2018-06-12 ENCOUNTER — Inpatient Hospital Stay (HOSPITAL_COMMUNITY): Payer: Medicare HMO | Admitting: Certified Registered Nurse Anesthetist

## 2018-06-12 ENCOUNTER — Encounter (HOSPITAL_COMMUNITY): Payer: Self-pay | Admitting: *Deleted

## 2018-06-12 ENCOUNTER — Inpatient Hospital Stay (HOSPITAL_COMMUNITY): Payer: Medicare HMO

## 2018-06-12 ENCOUNTER — Encounter (HOSPITAL_COMMUNITY)
Admission: EM | Disposition: A | Discharge status: Home / Self Care | Payer: Self-pay | Source: Home / Self Care | Attending: Internal Medicine

## 2018-06-12 DIAGNOSIS — I361 Nonrheumatic tricuspid (valve) insufficiency: Secondary | ICD-10-CM

## 2018-06-12 HISTORY — PX: SPHINCTEROTOMY: SHX5544

## 2018-06-12 HISTORY — PX: REMOVAL OF STONES: SHX5545

## 2018-06-12 HISTORY — PX: ERCP: SHX5425

## 2018-06-12 LAB — CBC
HCT: 28.7 % — ABNORMAL LOW (ref 36.0–46.0)
Hemoglobin: 9 g/dL — ABNORMAL LOW (ref 12.0–15.0)
MCH: 31.1 pg (ref 26.0–34.0)
MCHC: 31.4 g/dL (ref 30.0–36.0)
MCV: 99.3 fL (ref 80.0–100.0)
Platelets: 210 10*3/uL (ref 150–400)
RBC: 2.89 MIL/uL — ABNORMAL LOW (ref 3.87–5.11)
RDW: 13.7 % (ref 11.5–15.5)
WBC: 6.2 10*3/uL (ref 4.0–10.5)
nRBC: 0 % (ref 0.0–0.2)

## 2018-06-12 LAB — COMPREHENSIVE METABOLIC PANEL
ALT: 81 U/L — ABNORMAL HIGH (ref 0–44)
AST: 107 U/L — ABNORMAL HIGH (ref 15–41)
Albumin: 2.3 g/dL — ABNORMAL LOW (ref 3.5–5.0)
Alkaline Phosphatase: 211 U/L — ABNORMAL HIGH (ref 38–126)
Anion gap: 8 (ref 5–15)
BUN: 11 mg/dL (ref 8–23)
CO2: 26 mmol/L (ref 22–32)
CREATININE: 1.16 mg/dL — AB (ref 0.44–1.00)
Calcium: 7.6 mg/dL — ABNORMAL LOW (ref 8.9–10.3)
Chloride: 103 mmol/L (ref 98–111)
GFR calc non Af Amer: 44 mL/min — ABNORMAL LOW (ref >60)
GFR, EST AFRICAN AMERICAN: 51 mL/min — AB (ref >60)
Glucose, Bld: 65 mg/dL — ABNORMAL LOW (ref 70–99)
Potassium: 3.5 mmol/L (ref 3.5–5.1)
Sodium: 137 mmol/L (ref 135–145)
Total Bilirubin: 3.7 mg/dL — ABNORMAL HIGH (ref 0.3–1.2)
Total Protein: 5.2 g/dL — ABNORMAL LOW (ref 6.5–8.1)

## 2018-06-12 LAB — HEPATITIS PANEL, ACUTE
HCV Ab: 0.1 s/co ratio (ref 0.0–0.9)
Hep A IgM: NEGATIVE
Hep B C IgM: NEGATIVE
Hepatitis B Surface Ag: NEGATIVE

## 2018-06-12 LAB — GLUCOSE, CAPILLARY
Glucose-Capillary: 51 mg/dL — ABNORMAL LOW (ref 70–99)
Glucose-Capillary: 148 mg/dL — ABNORMAL HIGH (ref 70–99)
Glucose-Capillary: 111 mg/dL — ABNORMAL HIGH (ref 70–99)
Glucose-Capillary: 161 mg/dL — ABNORMAL HIGH (ref 70–99)
Glucose-Capillary: 174 mg/dL — ABNORMAL HIGH (ref 70–99)

## 2018-06-12 LAB — SURGICAL PCR SCREEN
MRSA, PCR: NEGATIVE
Staphylococcus aureus: NEGATIVE

## 2018-06-12 LAB — ECHOCARDIOGRAM COMPLETE
Height: 61 in
Weight: 2264.57 oz

## 2018-06-12 SURGERY — ERCP, WITH INTERVENTION IF INDICATED
Anesthesia: General

## 2018-06-12 MED ORDER — INSULIN ASPART 100 UNIT/ML ~~LOC~~ SOLN: 0.0000 [IU] | SUBCUTANEOUS | Status: DC

## 2018-06-12 MED ORDER — PROPOFOL 10 MG/ML IV BOLUS
INTRAVENOUS | Status: DC | PRN
  Administered 2018-06-12: 100 mg via INTRAVENOUS

## 2018-06-12 MED ORDER — CHLORHEXIDINE GLUCONATE CLOTH 2 % EX PADS
6.0000 | MEDICATED_PAD | Freq: Once | CUTANEOUS | Status: AC
  Administered 2018-06-12: 6 via TOPICAL

## 2018-06-12 MED ORDER — DEXTROSE-NACL 5-0.9 % IV SOLN
INTRAVENOUS | Status: DC
  Administered 2018-06-12: 16:00:00 via INTRAVENOUS

## 2018-06-12 MED ORDER — PHENYLEPHRINE HCL 10 MG/ML IJ SOLN
INTRAMUSCULAR | Status: DC | PRN
  Administered 2018-06-12: 160 ug via INTRAVENOUS

## 2018-06-12 MED ORDER — SODIUM CHLORIDE 0.9 % IV SOLN: INTRAVENOUS | Status: DC

## 2018-06-12 MED ORDER — CHLORHEXIDINE GLUCONATE CLOTH 2 % EX PADS
6.0000 | MEDICATED_PAD | Freq: Once | CUTANEOUS | Status: AC
  Administered 2018-06-13: 6 via TOPICAL

## 2018-06-12 MED ORDER — DEXTROSE 50 % IV SOLN
INTRAVENOUS | Status: AC
  Administered 2018-06-12: 50 mL
  Filled 2018-06-12: qty 50

## 2018-06-12 MED ORDER — IOPAMIDOL (ISOVUE-300) INJECTION 61%
INTRAVENOUS | Status: AC
  Filled 2018-06-12: qty 50

## 2018-06-12 MED ORDER — SUGAMMADEX SODIUM 200 MG/2ML IV SOLN
INTRAVENOUS | Status: DC | PRN
Start: 2018-06-12 — End: 2018-06-12
  Administered 2018-06-12: 125 mg via INTRAVENOUS

## 2018-06-12 MED ORDER — INDOMETHACIN 50 MG RE SUPP
RECTAL | Status: AC
  Filled 2018-06-12: qty 2

## 2018-06-12 MED ORDER — SODIUM CHLORIDE 0.9 % IV SOLN
INTRAVENOUS | Status: DC | PRN
  Administered 2018-06-12: 13:00:00 via INTRAVENOUS

## 2018-06-12 MED ORDER — MORPHINE SULFATE (PF) 4 MG/ML IV SOLN
2.6000 mg | Freq: Once | INTRAVENOUS | Status: AC
  Administered 2018-06-12: 2.6 mg via INTRAVENOUS

## 2018-06-12 MED ORDER — WHITE PETROLATUM EX OINT
TOPICAL_OINTMENT | CUTANEOUS | Status: AC
  Filled 2018-06-12: qty 28.35

## 2018-06-12 MED ORDER — TECHNETIUM TC 99M MEBROFENIN IV KIT
5.0000 | PACK | Freq: Once | INTRAVENOUS | Status: AC | PRN
  Administered 2018-06-12: 5 via INTRAVENOUS

## 2018-06-12 MED ORDER — SODIUM CHLORIDE 0.9 % IV SOLN
INTRAVENOUS | Status: DC | PRN
  Administered 2018-06-12: 20 mL

## 2018-06-12 MED ORDER — GLUCAGON HCL RDNA (DIAGNOSTIC) 1 MG IJ SOLR
INTRAMUSCULAR | Status: AC
  Filled 2018-06-12: qty 1

## 2018-06-12 MED ORDER — INDOMETHACIN 50 MG RE SUPP
RECTAL | Status: DC | PRN
  Administered 2018-06-12: 100 mg via RECTAL

## 2018-06-12 MED ORDER — LIDOCAINE HCL (CARDIAC) PF 100 MG/5ML IV SOSY
PREFILLED_SYRINGE | INTRAVENOUS | Status: DC | PRN
  Administered 2018-06-12: 60 mg via INTRAVENOUS

## 2018-06-12 MED ORDER — FENTANYL CITRATE (PF) 100 MCG/2ML IJ SOLN
INTRAMUSCULAR | Status: DC | PRN
  Administered 2018-06-12: 100 ug via INTRAVENOUS

## 2018-06-12 MED ORDER — ROCURONIUM BROMIDE 100 MG/10ML IV SOLN
INTRAVENOUS | Status: DC | PRN
  Administered 2018-06-12: 50 mg via INTRAVENOUS

## 2018-06-12 MED ORDER — MORPHINE SULFATE (PF) 4 MG/ML IV SOLN
INTRAVENOUS | Status: AC
  Filled 2018-06-12: qty 1

## 2018-06-12 MED ORDER — ONDANSETRON HCL 4 MG/2ML IJ SOLN
INTRAMUSCULAR | Status: DC | PRN
  Administered 2018-06-12: 4 mg via INTRAVENOUS

## 2018-06-12 NOTE — Transfer of Care (Signed)
Immediate Anesthesia Transfer of Care Note  Patient: Morgan Hubbard  Procedure(s) Performed: ENDOSCOPIC RETROGRADE CHOLANGIOPANCREATOGRAPHY (ERCP) (N/A )  Patient Location: Endoscopy Unit  Anesthesia Type:General  Level of Consciousness: awake, alert , oriented and patient cooperative  Airway & Oxygen Therapy: Patient Spontanous Breathing and Patient connected to face mask oxygen  Post-op Assessment: Report given to RN and Post -op Vital signs reviewed and stable  Post vital signs: Reviewed and stable  Last Vitals:  Vitals Value Taken Time  BP 121/48 06/12/2018  2:20 PM  Temp    Pulse 64 06/12/2018  2:21 PM  Resp 12 06/12/2018  2:21 PM  SpO2 97 % 06/12/2018  2:21 PM  Vitals shown include unvalidated device data.  Last Pain:  Vitals:   06/12/18 1201  TempSrc: Oral  PainSc: 0-No pain         Complications: No apparent anesthesia complications

## 2018-06-12 NOTE — Progress Notes (Signed)
[facility] Infusion Coordinator will follow pt with ID team to support home infusion pharmacy services at DC if ordered.  If patient discharges after hours, please call [phone].   Larry Sierras 06/12/2018, 7:46 AM

## 2018-06-12 NOTE — Progress Notes (Signed)
Patient ID: Morgan Hubbard, female   DOB: [birth year], 84 y.o.   MRN: [id number]          [facility] for Infectious Disease    Date of Admission:  06/10/2018   Day 2 piperacillin tazobactam  Morgan Hubbard has acute cholecystitis complicated by enterococcal bacteremia.  Her HIDA scan revealed:  IMPRESSION: No gallbladder uptake even after the administration of morphine. Findings are suggestive for cystic duct obstruction and concerning for cholecystitis.  She is scheduled to undergo ERCP this afternoon.  Repeat blood cultures are pending.  She has defervesced.  Continuing piperacillin tazobactam pending results of repeat blood cultures and TTE.  Please call me for any infectious disease questions this weekend.         Michel Bickers, MD [facility] [phone] pager   [phone] cell 06/12/2018, 11:51 AM

## 2018-06-12 NOTE — Anesthesia Postprocedure Evaluation (Signed)
Anesthesia Post Note  Patient: MARELLA VANDERPOL  Procedure(s) Performed: ENDOSCOPIC RETROGRADE CHOLANGIOPANCREATOGRAPHY (ERCP) (N/A ) SPHINCTEROTOMY REMOVAL OF STONES     Patient location during evaluation: PACU Anesthesia Type: General Level of consciousness: awake and alert Pain management: pain level controlled Vital Signs Assessment: post-procedure vital signs reviewed and stable Respiratory status: spontaneous breathing, nonlabored ventilation and respiratory function stable Cardiovascular status: blood pressure returned to baseline and stable Postop Assessment: no apparent nausea or vomiting Anesthetic complications: no    Last Vitals:  Vitals:   06/12/18 1500 06/12/18 1535  BP: (!) 149/76 (!) 177/84  Pulse: 60 (!) 59  Resp: 16 16  Temp:  (!) 36.4 C  SpO2: 94% 100%    Last Pain:  Vitals:   06/12/18 1535  TempSrc: Oral  PainSc:                  Lidia Collum

## 2018-06-12 NOTE — Anesthesia Procedure Notes (Signed)
Procedure Name: Intubation Date/Time: 06/12/2018 1:27 PM Performed by: Krysia Zahradnik T, CRNA Pre-anesthesia Checklist: Patient identified, Emergency Drugs available, Suction available and Patient being monitored Patient Re-evaluated:Patient Re-evaluated prior to induction Oxygen Delivery Method: Circle system utilized Preoxygenation: Pre-oxygenation with 100% oxygen Induction Type: IV induction Laryngoscope Size: Miller and 2 Grade View: Grade I Tube type: Oral Tube size: 7.5 mm Number of attempts: 1 Airway Equipment and Method: Patient positioned with wedge pillow and Stylet Placement Confirmation: ETT inserted through vocal cords under direct vision Secured at: 22 cm Tube secured with: Tape Dental Injury: Teeth and Oropharynx as per pre-operative assessment

## 2018-06-12 NOTE — Progress Notes (Signed)
Inpatient Diabetes Program Recommendations  AACE/ADA: New Consensus Statement on Inpatient Glycemic Control (2015)  Target Ranges:  Prepandial:   less than 140 mg/dL      Peak postprandial:   less than 180 mg/dL (1-2 hours)      Critically ill patients:  140 - 180 mg/dL   Lab Results  Component Value Date   GLUCAP 148 (H) 06/12/2018   HGBA1C 7.7 (H) 05/02/2017    Review of Glycemic Control Results for Morgan Hubbard, Morgan Hubbard (MRN [id number]) as of 06/12/2018 12:13  Ref. Range 06/11/2018 17:14 06/11/2018 17:46 06/11/2018 21:44 06/12/2018 11:07 06/12/2018 12:08  Glucose-Capillary Latest Ref Range: 70 - 99 mg/dL 55 (L) 79 225 (H) 51 (L) 148 (H)   Diabetes history: Type 2 DM Outpatient Diabetes medications: Amaryl 2 mg QD, Metformin 1000 mg BID Current orders for Inpatient glycemic control: Novolog 0-9 units TID, Novolog 0-5 units QHS  Inpatient Diabetes Program Recommendations:    Noted that patient has experienced multiple hypoglycemic episodes.   Consider decreasing correction to Novolog custom scale correction Q4H while patient is NPO and then transition to TID (once able to tolerate oral intake) and adding D5 to IV fluids.  Novolog Custom Correction Scale CBG <70 - implement hypoglycemia protocol   70-120- 0 121-150 - 0 151-200 - 0 201-250 - 1 251-300 - 2 301-350 - 3 351-400 - 4 >400- call MD  Thanks, Bronson Curb, MSN, RNC-OB Diabetes Coordinator [phone] (8a-5p)

## 2018-06-12 NOTE — Op Note (Signed)
[facility] Patient Name: Morgan Hubbard Procedure Date : 06/12/2018 MRN: [id number] Attending MD: Carol Ada , MD Date of Birth: [birth year] CSN: [id number] Age: 84 Admit Type: Inpatient Procedure:                ERCP Indications:              Common bile duct stone(s) Providers:                Carol Ada, MD, Zenon Mayo, RN, Cherylynn Ridges,                            Technician, Raphael Gibney, CRNA Referring MD:              Medicines:                General Anesthesia Complications:            No immediate complications. Estimated Blood Loss:     Estimated blood loss: none. Procedure:                Pre-Anesthesia Assessment:                           - Prior to the procedure, a History and Physical                            was performed, and patient medications and                            allergies were reviewed. The patient's tolerance of                            previous anesthesia was also reviewed. The risks                            and benefits of the procedure and the sedation                            options and risks were discussed with the patient.                            All questions were answered, and informed consent                            was obtained. Prior Anticoagulants: The patient has                            taken no previous anticoagulant or antiplatelet                            agents. ASA Grade Assessment: III - A patient with                            severe systemic disease. After reviewing the risks  and benefits, the patient was deemed in                            satisfactory condition to undergo the procedure.                           - Sedation was administered by an anesthesia                            professional. Deep sedation was attained.                           After obtaining informed consent, the scope was                            passed under direct vision. Throughout  the                            procedure, the patient's blood pressure, pulse, and                            oxygen saturations were monitored continuously. The                            [id number]-Q180V ([id number] Olympus duodenoscope was                            introduced through the mouth, and used to inject                            contrast into and used to inject contrast into the                            bile duct. The ERCP was accomplished without                            difficulty. The patient tolerated the procedure                            well. Scope In: Scope Out: Findings:      The major papilla was normal. The bile duct was deeply cannulated with       the short-nosed traction sphincterotome. Contrast was injected. I       personally interpreted the bile duct images. There was brisk flow of       contrast through the ducts. Image quality was excellent. Contrast       extended to the main bile duct. Contrast extended to the bifurcation.       The middle third of the main bile duct contained one stone, which was 8       mm in diameter. The main bile duct was diffusely dilated, with a stone       causing an obstruction. The largest diameter was 10 mm. A short 0.035       inch Soft Jagwire was passed into the biliary tree. An 8 mm biliary  sphincterotomy was made with a monofilament traction (standard)       sphincterotome using ERBE electrocautery. There was no       post-sphincterotomy bleeding. The biliary tree was swept with a 12 mm       balloon starting at the bifurcation. One stone was removed. No stones       remained.      The CBD was easily cannulated. Adjacent to the ampulla was a large       diverticulum. The guidewire was secured in the left intrahepatic ducts.       Contrast injection revealed a dialted CBD and the possibility of a       filling defect in the mid CBD. The CBD was swept a total of 5 times.       During the second sweep one black stone  was removed. The final occlusion       cholangiogram was negative for any retained stones. There was no       opacification of the cystic duct or gallbladder. Impression:               - The major papilla appeared normal.                           - The entire main bile duct was dilated, with a                            stone causing an obstruction.                           - Choledocholithiasis was found. Complete removal                            was accomplished by biliary sphincterotomy and                            balloon extraction.                           - A biliary sphincterotomy was performed.                           - The biliary tree was swept. Recommendation:           - Return patient to hospital ward for ongoing care.                           - Clear liquid diet.                           - Lap chole per surgery. Procedure Code(s):        --- Professional ---                           [id number], Endoscopic retrograde                            cholangiopancreatography (ERCP); with removal of  calculi/debris from biliary/pancreatic duct(s)                           [phone], Endoscopic retrograde                            cholangiopancreatography (ERCP); with                            sphincterotomy/papillotomy                           [phone], Endoscopic catheterization of the biliary                            ductal system, radiological supervision and                            interpretation Diagnosis Code(s):        --- Professional ---                           K80.51, Calculus of bile duct without cholangitis                            or cholecystitis with obstruction CPT copyright 2018 [employer]. All rights reserved. The codes documented in this report are preliminary and upon coder review may  be revised to meet current compliance requirements. Carol Ada, MD Carol Ada, MD 06/12/2018 2:11:35 PM This report  has been signed electronically. Number of Addenda: 0

## 2018-06-12 NOTE — Progress Notes (Signed)
[facility] PROGRESS NOTE  Morgan Hubbard IWL:[id number] DOB: August 18, 1934 DOA: 06/10/2018   PCP: Abner Greenspan, MD     Assessment/Plan: Principal Problem:   Enterococcal bacteremia Active Problems:   Type II diabetes mellitus with renal manifestations ([place])   HYPERCHOLESTEROLEMIA   Essential hypertension   Gallstones   Nonischemic cardiomyopathy (HCC)   Stroke, small vessel (HCC)   Chronic systolic CHF (congestive heart failure) (HCC)   Sepsis (HCC)   Abdominal pain   Abnormal LFTs   GERD (gastroesophageal reflux disease)   Acute renal failure superimposed on stage 3 chronic kidney disease (HCC)   Cholecystitis   AICD (automatic cardioverter/defibrillator) present   84 y.o. female with medical history significant of hypertension, hyperlipidemia, diabetes mellitus, stroke, GERD, left bundle blockage, migraine headache, AICD placement, vertigo, solitary kidney, CHF with EF of >55% in 1/19, breast cancer (lumpectomy), who presents with fever, chills, abdominal pain, nausea and vomiting.  Admitted with suspicion for acute cholecystitis.   Assessment and plan  Abdominal pain and sepsis due to possible cholecystitis and gallstone: Patient meets critical for sepsis with fever, tachypnea.  Her lactic acid is elevated 2.93.  Currently hemodynamically stable.  General surgeon, Dr. Brantley Stage is consulted by EDP. - continue telemetry -continue: Zosyn (patient received 1 dose of cefepime and Flagyl in ED) -Blood culture positive for  Enterococcus faecium ,currently on Zosyn , patient seen by infectious disease Dr. [name]. He recommends repeat blood cultures on 1/10 , TTE, likely source is the gallbladder - Procalcitonin  7.4   -IVF:  Continue aggressive IV fluids treatment). Will give small bolus as needed depending on lactic acid levels -PRN morphine for pain and Zofran for nausea -HIDA scan positive ,surgery requested Dr. Carol Ada to perform ERCP 1/10  Clear liquid   , NPO  after midnight  Possible laparoscopic cholecystectomy tomorrow   enterococcal bacteremia Follow repeat blood cultures, TTE as patient has an AICD Continue Zosyn, ID following  Type II diabetes mellitus with renal manifestations ([place]): Last A1c 7.7 on 05/02/18, not well controled. Patient is taking metformin and Amaryl at home -SSI  HYPERCHOLESTEROLEMIA: -hold Lipitor due to abnormal liver function  Essential hypertension: Blood pressure 129/51 -hold Cozaar due to worsening renal function - Hold oral hydralazine since patient at the risk of developing hypotension due to sepsis -Continue Coreg -IV hydralazine as needed  Hx of Stroke, small vessel ([place]): -hold lipitor as above -on prn ASA (hold home ASA)  Chronic systolic CHF (congestive heart failure) ([place]):  Abnormal LFTs: likely due to gallstone -hold lipitor -avoid using tylenol    GERD (gastroesophageal reflux disease): -protonix  AoCKD-III: Baseline creatinine at baseline is around 0.97. She presented with a creatinine of 1.24 that increased to 1.48 Now 1.16 - IVF as above - Follow up renal function by BMP    DVT prophylaxsis  SCDs  Code Status:  Full code        Family Communication: Discussed in detail with the patient, all imaging results, lab results explained to the patient   Disposition Plan:   Pending laparoscopic cholecystectomy       Consultants:  General surgery  Procedures:  none  Antibiotics: Anti-infectives (From admission, onward)   Start     Dose/Rate Route Frequency Ordered Stop   06/11/18 2200  piperacillin-tazobactam (ZOSYN) IVPB 3.375 g  Status:  Discontinued     3.375 g 12.5 mL/hr over 240 Minutes Intravenous Every 8 hours 06/11/18 1603 06/11/18 1621   06/11/18  1800  [MAR Hold]  piperacillin-tazobactam (ZOSYN) IVPB 3.375 g     (MAR Hold since Fri 06/12/2018 at 1159. Reason: Transfer to a Procedural area.)   3.375 g 12.5 mL/hr over 240 Minutes Intravenous Every 8  hours 06/11/18 1621     06/11/18 1645  vancomycin (VANCOCIN) 1,500 mg in sodium chloride 0.9 % 500 mL IVPB  Status:  Discontinued     1,500 mg 250 mL/hr over 120 Minutes Intravenous NOW 06/11/18 1559 06/11/18 1602   06/11/18 0600  piperacillin-tazobactam (ZOSYN) IVPB 3.375 g  Status:  Discontinued     3.375 g 12.5 mL/hr over 240 Minutes Intravenous Every 12 hours 06/10/18 2124 06/11/18 1603   06/10/18 2300  [MAR Hold]  dapsone tablet 25 mg     (MAR Hold since Fri 06/12/2018 at 1159. Reason: Transfer to a Procedural area.)   25 mg Oral Daily 06/10/18 2257     06/10/18 2115  dapsone tablet 25 mg  Status:  Discontinued     25 mg Oral Daily 06/10/18 2112 06/10/18 2257   06/10/18 1930  metroNIDAZOLE (FLAGYL) IVPB 500 mg     500 mg 100 mL/hr over 60 Minutes Intravenous  Once 06/10/18 1924 06/10/18 2153   06/10/18 1800  ceFEPIme (MAXIPIME) 2 g in sodium chloride 0.9 % 100 mL IVPB     2 g 200 mL/hr over 30 Minutes Intravenous  Once 06/10/18 1746 06/10/18 2000         HPI/Subjective:  low-grade fever, blood pressure stable, tolerating clear liquids, scheduled for ERCP today  Objective: Vitals:   06/11/18 2148 06/12/18 0508 06/12/18 1102 06/12/18 1201  BP: (!) 150/60 (!) 152/62 (!) 163/69 (!) 196/69  Pulse: 71 72 74 70  Resp: 18 16 17 10   Temp: 99.1 F (37.3 C) 98.4 F (36.9 C) 98.4 F (36.9 C)   TempSrc: Oral Oral Oral Oral  SpO2: 92% 91% 92% 94%  Weight: 64.2 kg     Height: 5\' 1"  (1.549 m)       Intake/Output Summary (Last 24 hours) at 06/12/2018 1357 Last data filed at 06/12/2018 0600 Gross per 24 hour  Intake 281.01 ml  Output 300 ml  Net -18.99 ml    Exam:  Examination:  General exam: Appears calm and comfortable  Respiratory system: Clear to auscultation. Respiratory effort normal. Cardiovascular system: S1 & S2 heard, RRR. No JVD, murmurs, rubs, gallops or clicks. No pedal edema. Gastrointestinal system: Abdomen is nondistended, soft and nontender. No organomegaly  or masses felt. Normal bowel sounds heard. Left flank scar  Central nervous system: Alert and oriented. No focal neurological deficits. Extremities: Symmetric 5 x 5 power. Skin: No rashes, lesions or ulcers Psychiatry: Judgement and insight appear normal. Mood & affect appropriate.     Data Reviewed: I have personally reviewed following labs and imaging studies  Micro Results Recent Results (from the past 240 hour(s))  Culture, blood (Routine x 2)     Status: None (Preliminary result)   Collection Time: 06/10/18  5:12 PM  Result Value Ref Range Status   Specimen Description SITE NOT SPECIFIED  Final   Special Requests   Final    BOTTLES DRAWN AEROBIC AND ANAEROBIC Blood Culture adequate volume   Culture   Final    NO GROWTH 2 DAYS Performed at [facility], [address] [place], [place] [address]    Report Status PENDING  Incomplete  Culture, blood (Routine x 2)     Status: Abnormal (Preliminary result)  Collection Time: 06/10/18  5:38 PM  Result Value Ref Range Status   Specimen Description BLOOD LEFT ANTECUBITAL  Final   Special Requests   Final    BOTTLES DRAWN AEROBIC AND ANAEROBIC Blood Culture results may not be optimal due to an excessive volume of blood received in culture bottles   Culture  Setup Time   Final    GRAM POSITIVE COCCI AEROBIC BOTTLE ONLY Organism ID to follow CRITICAL RESULT CALLED TO, READ BACK BY AND VERIFIED WITH: Hughie Closs PharmD 15:40 06/11/18 ([email or web address]) Performed at [facility], [address], [place], [place] [address]    Culture ENTEROCOCCUS FAECIUM (A)  Final   Report Status PENDING  Incomplete  Blood Culture ID Panel (Reflexed)     Status: Abnormal   Collection Time: 06/10/18  5:38 PM  Result Value Ref Range Status   Enterococcus species DETECTED (A) NOT DETECTED Final    Comment: CRITICAL RESULT CALLED TO, READ BACK BY AND VERIFIED WITH: Hughie Closs PharmD 15:40 06/11/18 ([email or web address])    Vancomycin resistance NOT DETECTED NOT  DETECTED Final   Listeria monocytogenes NOT DETECTED NOT DETECTED Final   Staphylococcus species NOT DETECTED NOT DETECTED Final   Staphylococcus aureus (BCID) NOT DETECTED NOT DETECTED Final   Streptococcus species NOT DETECTED NOT DETECTED Final   Streptococcus agalactiae NOT DETECTED NOT DETECTED Final   Streptococcus pneumoniae NOT DETECTED NOT DETECTED Final   Streptococcus pyogenes NOT DETECTED NOT DETECTED Final   Acinetobacter baumannii NOT DETECTED NOT DETECTED Final   Enterobacteriaceae species NOT DETECTED NOT DETECTED Final   Enterobacter cloacae complex NOT DETECTED NOT DETECTED Final   Escherichia coli NOT DETECTED NOT DETECTED Final   Klebsiella oxytoca NOT DETECTED NOT DETECTED Final   Klebsiella pneumoniae NOT DETECTED NOT DETECTED Final   Proteus species NOT DETECTED NOT DETECTED Final   Serratia marcescens NOT DETECTED NOT DETECTED Final   Haemophilus influenzae NOT DETECTED NOT DETECTED Final   Neisseria meningitidis NOT DETECTED NOT DETECTED Final   Pseudomonas aeruginosa NOT DETECTED NOT DETECTED Final   Candida albicans NOT DETECTED NOT DETECTED Final   Candida glabrata NOT DETECTED NOT DETECTED Final   Candida krusei NOT DETECTED NOT DETECTED Final   Candida parapsilosis NOT DETECTED NOT DETECTED Final   Candida tropicalis NOT DETECTED NOT DETECTED Final    Comment: Performed at [facility], [address], [place], [place] [address]    Radiology Reports Dg Chest 2 View  Result Date: 06/10/2018 CLINICAL DATA:  Chills, headache, and body aches all day. Diagnosed with the flu. EXAM: CHEST - 2 VIEW COMPARISON:  05/13/2018. FINDINGS: Stable hyperinflation. LEFT AICD unchanged. Cardiomegaly. Thoracic atherosclerosis. Clear lung fields. Straightening of the normal thoracic kyphosis. No worrisome osseous lesion. Stable appearance from priors. IMPRESSION: No active cardiopulmonary disease. Electronically Signed   By: Staci Righter M.D.   On: 06/10/2018 18:24   Nm  Hepatobiliary Liver Func  Result Date: 06/12/2018 CLINICAL DATA:  84 year old with cholelithiasis. EXAM: NUCLEAR MEDICINE HEPATOBILIARY IMAGING TECHNIQUE: Sequential images of the abdomen were obtained following intravenous administration of radiopharmaceutical. 2.6 mg of morphine was administered approximately 90 minutes after the radiopharmaceutical injection. Additional 30 minutes of imaging was obtained after the morphine injection. RADIOPHARMACEUTICALS:  5.3 mCi Tc-12m  Choletec IV COMPARISON:  Ultrasound 06/10/2018 FINDINGS: Prompt uptake and biliary excretion of activity in the liver. Activity identified in the small bowel compatible with a patent common bile duct. However, there is no activity identified in the gallbladder throughout this examination.  IMPRESSION: No gallbladder uptake even after the administration of morphine. Findings are suggestive for cystic duct obstruction and concerning for cholecystitis. Electronically Signed   By: Markus Daft M.D.   On: 06/12/2018 11:20   US Abdomen Limited  Result Date: 06/10/2018 CLINICAL DATA:  Fever, abnormal liver function tests and heartburn. Known gallstones. EXAM: ULTRASOUND ABDOMEN LIMITED RIGHT UPPER QUADRANT COMPARISON:  None. FINDINGS: Gallbladder: Mild single wall thickness of the gallbladder is suggested to 5 mm, normal at 3 mm or less. The gallbladder is physiologically distended without pathologic distention however. There also appear to be echogenic shadowing gallstones up to 1.3 cm near the neck of the gallbladder admixed with sludge. Sonographer indicates no sonographic Murphy's sign. Common bile duct: Diameter: 10 mm without choledocholithiasis. Liver: No focal lesion identified. Within normal limits in parenchymal echogenicity. Portal vein is patent on color Doppler imaging with normal direction of blood flow towards the liver. IMPRESSION: 1. Slightly thickened appearance of the gallbladder without significant distention nor pericholecystic  fluid. Intraluminal biliary sludge and gallstones are identified. Findings may be secondary to a chronic cholecystitis. HIDA scan may help for further assessment. 2. Dilated common bowel duct to 10 mm without choledocholithiasis. There is a slightly above expected for age. Recently passed stone may contribute to this appearance. Electronically Signed   By: Ashley Royalty M.D.   On: 06/10/2018 19:40     CBC Recent Labs  Lab 06/09/18 1218 06/10/18 1713 06/11/18 0221 06/12/18 0610  WBC 4.9 6.1 12.3* 6.2  HGB 11.4* 11.0* 9.7* 9.0*  HCT 34.9* 35.8* 31.7* 28.7*  PLT 281.0 259 231 210  MCV 96.9 98.6 100.0 99.3  MCH  --  30.3 30.6 31.1  MCHC 32.6 30.7 30.6 31.4  RDW 13.9 13.1 13.5 13.7  LYMPHSABS 1.6 0.9  --   --   MONOABS 0.4 0.3  --   --   EOSABS 0.5 0.2  --   --   BASOSABS 0.0 0.0  --   --     Chemistries  Recent Labs  Lab 06/09/18 1218 06/10/18 1713 06/11/18 0221 06/12/18 0610  NA  --  143 141 137  K  --  3.8 4.2 3.5  CL  --  104 102 103  CO2  --  29 29 26   GLUCOSE  --  159* 198* 65*  BUN  --  11 15 11   CREATININE  --  1.24* 1.48* 1.16*  CALCIUM  --  9.1 8.3* 7.6*  AST 29 270* 264* 107*  ALT 41* 105* 128* 81*  ALKPHOS 255* 316* 256* 211*  BILITOT 1.0 3.2* 4.3* 3.7*   ------------------------------------------------------------------------------------------------------------------ estimated creatinine clearance is 32.7 mL/min (A) (by C-G formula based on SCr of 1.16 mg/dL (H)). ------------------------------------------------------------------------------------------------------------------ No results for input(s): HGBA1C in the last 72 hours. ------------------------------------------------------------------------------------------------------------------ No results for input(s): CHOL, HDL, LDLCALC, TRIG, CHOLHDL, LDLDIRECT in the last 72 hours. ------------------------------------------------------------------------------------------------------------------ No results for  input(s): TSH, T4TOTAL, T3FREE, THYROIDAB in the last 72 hours.  Invalid input(s): FREET3 ------------------------------------------------------------------------------------------------------------------ No results for input(s): VITAMINB12, FOLATE, FERRITIN, TIBC, IRON, RETICCTPCT in the last 72 hours.  Coagulation profile Recent Labs  Lab 06/10/18 1713  INR 0.96    No results for input(s): DDIMER in the last 72 hours.  Cardiac Enzymes No results for input(s): CKMB, TROPONINI, MYOGLOBIN in the last 168 hours.  Invalid input(s): CK ------------------------------------------------------------------------------------------------------------------ Invalid input(s): POCBNP   CBG: Recent Labs  Lab 06/11/18 1714 06/11/18 1746 06/11/18 2144 06/12/18 1107 06/12/18 1208  GLUCAP 55* 79 225* 51*  148*       Studies: Dg Chest 2 View  Result Date: 06/10/2018 CLINICAL DATA:  Chills, headache, and body aches all day. Diagnosed with the flu. EXAM: CHEST - 2 VIEW COMPARISON:  05/13/2018. FINDINGS: Stable hyperinflation. LEFT AICD unchanged. Cardiomegaly. Thoracic atherosclerosis. Clear lung fields. Straightening of the normal thoracic kyphosis. No worrisome osseous lesion. Stable appearance from priors. IMPRESSION: No active cardiopulmonary disease. Electronically Signed   By: Staci Righter M.D.   On: 06/10/2018 18:24   Nm Hepatobiliary Liver Func  Result Date: 06/12/2018 CLINICAL DATA:  84 year old with cholelithiasis. EXAM: NUCLEAR MEDICINE HEPATOBILIARY IMAGING TECHNIQUE: Sequential images of the abdomen were obtained following intravenous administration of radiopharmaceutical. 2.6 mg of morphine was administered approximately 90 minutes after the radiopharmaceutical injection. Additional 30 minutes of imaging was obtained after the morphine injection. RADIOPHARMACEUTICALS:  5.3 mCi Tc-48m  Choletec IV COMPARISON:  Ultrasound 06/10/2018 FINDINGS: Prompt uptake and biliary excretion of  activity in the liver. Activity identified in the small bowel compatible with a patent common bile duct. However, there is no activity identified in the gallbladder throughout this examination. IMPRESSION: No gallbladder uptake even after the administration of morphine. Findings are suggestive for cystic duct obstruction and concerning for cholecystitis. Electronically Signed   By: Markus Daft M.D.   On: 06/12/2018 11:20   US Abdomen Limited  Result Date: 06/10/2018 CLINICAL DATA:  Fever, abnormal liver function tests and heartburn. Known gallstones. EXAM: ULTRASOUND ABDOMEN LIMITED RIGHT UPPER QUADRANT COMPARISON:  None. FINDINGS: Gallbladder: Mild single wall thickness of the gallbladder is suggested to 5 mm, normal at 3 mm or less. The gallbladder is physiologically distended without pathologic distention however. There also appear to be echogenic shadowing gallstones up to 1.3 cm near the neck of the gallbladder admixed with sludge. Sonographer indicates no sonographic Murphy's sign. Common bile duct: Diameter: 10 mm without choledocholithiasis. Liver: No focal lesion identified. Within normal limits in parenchymal echogenicity. Portal vein is patent on color Doppler imaging with normal direction of blood flow towards the liver. IMPRESSION: 1. Slightly thickened appearance of the gallbladder without significant distention nor pericholecystic fluid. Intraluminal biliary sludge and gallstones are identified. Findings may be secondary to a chronic cholecystitis. HIDA scan may help for further assessment. 2. Dilated common bowel duct to 10 mm without choledocholithiasis. There is a slightly above expected for age. Recently passed stone may contribute to this appearance. Electronically Signed   By: Ashley Royalty M.D.   On: 06/10/2018 19:40      Lab Results  Component Value Date   HGBA1C 7.7 (H) 05/02/2017   HGBA1C 9.0 (H) 04/22/2016   HGBA1C 8.9 (H) 01/14/2014   Lab Results  Component Value Date    MICROALBUR 78.0 (H) 04/22/2016   LDLCALC 54 06/09/2018   CREATININE 1.16 (H) 06/12/2018       Scheduled Meds: . [MAR Hold] carvedilol  12.5 mg Oral BID WC  . [MAR Hold] cholecalciferol  1,000 Units Oral Daily  . [MAR Hold] dapsone  25 mg Oral Daily  . [MAR Hold] gabapentin  300 mg Oral TID  . [MAR Hold] heparin injection (subcutaneous)  5,000 Units Subcutaneous Q8H  . insulin aspart  0-5 Units Subcutaneous Q4H  . [MAR Hold] isosorbide mononitrate  30 mg Oral Daily  . [MAR Hold] letrozole  2.5 mg Oral Daily  . [MAR Hold] loratadine  10 mg Oral Daily  . [MAR Hold]  morphine injection  4 mg Intravenous Once  . morphine      . [  MAR Hold] ondansetron (ZOFRAN) IV  4 mg Intravenous Once  . [MAR Hold] pantoprazole  40 mg Oral Daily  . [MAR Hold] vitamin B-12  1,000 mcg Oral Daily  . white petrolatum       Continuous Infusions: . dextrose 5 % and 0.9% NaCl    . [MAR Hold] piperacillin-tazobactam (ZOSYN)  IV 3.375 g (06/12/18 1120)     LOS: 2 days    Time spent: >30 MINS    Reyne Dumas  [facility] Pager [phone]. If 7PM-7AM, please contact night-coverage at [email or web address], password [facility] 06/12/2018, 1:57 PM  LOS: 2 days

## 2018-06-12 NOTE — Interval H&P Note (Signed)
History and Physical Interval Note:  06/12/2018 12:57 PM  [place]  has presented today for surgery, with the diagnosis of Choledocholithiasis  The various methods of treatment have been discussed with the patient and family. After consideration of risks, benefits and other options for treatment, the patient has consented to  Procedure(s): ENDOSCOPIC RETROGRADE CHOLANGIOPANCREATOGRAPHY (ERCP) (N/A) as a surgical intervention .  The patient's history has been reviewed, patient examined, no change in status, stable for surgery.  I have reviewed the patient's chart and labs.  Questions were answered to the patient's satisfaction.     Norine Reddington D

## 2018-06-12 NOTE — Care Management Important Message (Signed)
Important Message  Patient Details  Name: Morgan Hubbard MRN: [id number] Date of Birth: August 17, 1934   Medicare Important Message Given:  Yes    Ellah Otte 06/12/2018, 3:52 PM

## 2018-06-12 NOTE — Progress Notes (Signed)
[facility]/Trauma Progress Note  Day of Surgery   Assessment/Plan HTN HLD DM-II CKD-III H/o stroke GERD H/o nonischemic cardiomyopathy s/p AICD placement 2016 with improved EF 55% (06/25/17) - followed by Dr. Nancy Fetter at [facility] Solitary kidney - donated kidney to her brother in 1989 H/o breast cancer s/p lumpectomy at [facility] Chronic back pain - takes tramadol PRN (not daily)  Nausea and vomiting Fever Elevated LFTs Cholelithiasis, ?acute cholecystitis Gram positive bacteremia - HIDA postive, no uptake in gallbladder - ERCP today  - likely OR tomorrow for lap chole, NPO at midnight    ID - maxipime/flagyl 1/8, zosyn 1/9>> VTE - SCDs, sq heparin FEN - IVF, NPO after MN Foley - none    LOS: 2 days    Subjective: CC: nausea and vomiting  Pt states no vomiting since Wednesday. She has no abdominal pain. She has no complaints right now. She is in the ENDO room about to have an ERCP.   Objective: Vital signs in last 24 hours: Temp:  [98.4 F (36.9 C)-99.1 F (37.3 C)] 98.4 F (36.9 C) (01/10 1102) Pulse Rate:  [70-74] 70 (01/10 1201) Resp:  [10-18] 10 (01/10 1201) BP: (150-196)/(60-69) 196/69 (01/10 1201) SpO2:  [91 %-94 %] 94 % (01/10 1201) Weight:  [64.2 kg] 64.2 kg (01/09 2148) Last BM Date: 06/10/18  Intake/Output from previous day: 01/09 0701 - 01/10 0700 In: 331 [P.O.:240; I.V.:41; IV Piggyback:50] Out: 300 [Urine:300] Intake/Output this shift: No intake/output data recorded.  PE: Gen:  Alert, NAD, pleasant, cooperative Pulm:  Rate and effort normal Abd: Soft, NT/ND, +BS Skin: no rashes noted, warm and dry   Anti-infectives: Anti-infectives (From admission, onward)   Start     Dose/Rate Route Frequency Ordered Stop   06/11/18 2200  piperacillin-tazobactam (ZOSYN) IVPB 3.375 g  Status:  Discontinued     3.375 g 12.5 mL/hr over 240 Minutes Intravenous Every 8 hours 06/11/18 1603 06/11/18 1621   06/11/18 1800  [MAR Hold]   piperacillin-tazobactam (ZOSYN) IVPB 3.375 g     (MAR Hold since Fri 06/12/2018 at 1159. Reason: Transfer to a Procedural area.)   3.375 g 12.5 mL/hr over 240 Minutes Intravenous Every 8 hours 06/11/18 1621     06/11/18 1645  vancomycin (VANCOCIN) 1,500 mg in sodium chloride 0.9 % 500 mL IVPB  Status:  Discontinued     1,500 mg 250 mL/hr over 120 Minutes Intravenous NOW 06/11/18 1559 06/11/18 1602   06/11/18 0600  piperacillin-tazobactam (ZOSYN) IVPB 3.375 g  Status:  Discontinued     3.375 g 12.5 mL/hr over 240 Minutes Intravenous Every 12 hours 06/10/18 2124 06/11/18 1603   06/10/18 2300  [MAR Hold]  dapsone tablet 25 mg     (MAR Hold since Fri 06/12/2018 at 1159. Reason: Transfer to a Procedural area.)   25 mg Oral Daily 06/10/18 2257     06/10/18 2115  dapsone tablet 25 mg  Status:  Discontinued     25 mg Oral Daily 06/10/18 2112 06/10/18 2257   06/10/18 1930  metroNIDAZOLE (FLAGYL) IVPB 500 mg     500 mg 100 mL/hr over 60 Minutes Intravenous  Once 06/10/18 1924 06/10/18 2153   06/10/18 1800  ceFEPIme (MAXIPIME) 2 g in sodium chloride 0.9 % 100 mL IVPB     2 g 200 mL/hr over 30 Minutes Intravenous  Once 06/10/18 1746 06/10/18 2000      Lab Results:  Recent Labs    06/11/18 0221 06/12/18 0610  WBC 12.3* 6.2  HGB 9.7*  9.0*  HCT 31.7* 28.7*  PLT 231 210   BMET Recent Labs    06/11/18 0221 06/12/18 0610  NA 141 137  K 4.2 3.5  CL 102 103  CO2 29 26  GLUCOSE 198* 65*  BUN 15 11  CREATININE 1.48* 1.16*  CALCIUM 8.3* 7.6*   PT/INR Recent Labs    06/10/18 1713  LABPROT 12.6  INR 0.96   CMP     Component Value Date/Time   NA 137 06/12/2018 0610   K 3.5 06/12/2018 0610   CL 103 06/12/2018 0610   CO2 26 06/12/2018 0610   GLUCOSE 65 (L) 06/12/2018 0610   BUN 11 06/12/2018 0610   CREATININE 1.16 (H) 06/12/2018 0610   CALCIUM 7.6 (L) 06/12/2018 0610   PROT 5.2 (L) 06/12/2018 0610   ALBUMIN 2.3 (L) 06/12/2018 0610   AST 107 (H) 06/12/2018 0610   ALT 81 (H)  06/12/2018 0610   ALKPHOS 211 (H) 06/12/2018 0610   BILITOT 3.7 (H) 06/12/2018 0610   GFRNONAA 44 (L) 06/12/2018 0610   GFRAA 51 (L) 06/12/2018 0610   Lipase     Component Value Date/Time   LIPASE 21 06/11/2018 0221    Studies/Results: Dg Chest 2 View  Result Date: 06/10/2018 CLINICAL DATA:  Chills, headache, and body aches all day. Diagnosed with the flu. EXAM: CHEST - 2 VIEW COMPARISON:  05/13/2018. FINDINGS: Stable hyperinflation. LEFT AICD unchanged. Cardiomegaly. Thoracic atherosclerosis. Clear lung fields. Straightening of the normal thoracic kyphosis. No worrisome osseous lesion. Stable appearance from priors. IMPRESSION: No active cardiopulmonary disease. Electronically Signed   By: Staci Righter M.D.   On: 06/10/2018 18:24   Nm Hepatobiliary Liver Func  Result Date: 06/12/2018 CLINICAL DATA:  84 year old with cholelithiasis. EXAM: NUCLEAR MEDICINE HEPATOBILIARY IMAGING TECHNIQUE: Sequential images of the abdomen were obtained following intravenous administration of radiopharmaceutical. 2.6 mg of morphine was administered approximately 90 minutes after the radiopharmaceutical injection. Additional 30 minutes of imaging was obtained after the morphine injection. RADIOPHARMACEUTICALS:  5.3 mCi Tc-1m  Choletec IV COMPARISON:  Ultrasound 06/10/2018 FINDINGS: Prompt uptake and biliary excretion of activity in the liver. Activity identified in the small bowel compatible with a patent common bile duct. However, there is no activity identified in the gallbladder throughout this examination. IMPRESSION: No gallbladder uptake even after the administration of morphine. Findings are suggestive for cystic duct obstruction and concerning for cholecystitis. Electronically Signed   By: Markus Daft M.D.   On: 06/12/2018 11:20   US Abdomen Limited  Result Date: 06/10/2018 CLINICAL DATA:  Fever, abnormal liver function tests and heartburn. Known gallstones. EXAM: ULTRASOUND ABDOMEN LIMITED RIGHT UPPER  QUADRANT COMPARISON:  None. FINDINGS: Gallbladder: Mild single wall thickness of the gallbladder is suggested to 5 mm, normal at 3 mm or less. The gallbladder is physiologically distended without pathologic distention however. There also appear to be echogenic shadowing gallstones up to 1.3 cm near the neck of the gallbladder admixed with sludge. Sonographer indicates no sonographic Murphy's sign. Common bile duct: Diameter: 10 mm without choledocholithiasis. Liver: No focal lesion identified. Within normal limits in parenchymal echogenicity. Portal vein is patent on color Doppler imaging with normal direction of blood flow towards the liver. IMPRESSION: 1. Slightly thickened appearance of the gallbladder without significant distention nor pericholecystic fluid. Intraluminal biliary sludge and gallstones are identified. Findings may be secondary to a chronic cholecystitis. HIDA scan may help for further assessment. 2. Dilated common bowel duct to 10 mm without choledocholithiasis. There is a slightly above expected for  age. Recently passed stone may contribute to this appearance. Electronically Signed   By: Ashley Royalty M.D.   On: 06/10/2018 19:40      Kalman Drape , [facility] 06/12/2018, 1:25 PM  Pager: [phone] Mon-Wed, Friday 7:00am-4:30pm Thurs 7am-11:30am  Consults: [phone]

## 2018-06-12 NOTE — Progress Notes (Signed)
  Echocardiogram 2D Echocardiogram has been performed.  Morgan Hubbard 06/12/2018, 4:48 PM

## 2018-06-12 NOTE — Anesthesia Preprocedure Evaluation (Signed)
Anesthesia Evaluation  Patient identified by MRN, date of birth, ID band Patient awake    Reviewed: Allergy & Precautions, H&P , NPO status , Patient's Chart, lab work & pertinent test results, reviewed documented beta blocker date and time   History of Anesthesia Complications Negative for: history of anesthetic complications  Airway Mallampati: I   Neck ROM: Full  Mouth opening: Limited Mouth Opening  Dental  (+) Teeth Intact, Dental Advisory Given   Pulmonary    breath sounds clear to auscultation       Cardiovascular hypertension, Pt. on medications and Pt. on home beta blockers  Rhythm:Regular Rate:Normal     Neuro/Psych    GI/Hepatic GERD  Medicated and Controlled,Fatty liver.   Endo/Other  diabetes, Well Controlled, Type 2, Oral Hypoglycemic Agents  Renal/GU Renal InsufficiencyRenal diseasePt has only one kidney as she donated one.     Musculoskeletal   Abdominal Normal abdominal exam  (+)   Peds  Hematology  (+) Blood dyscrasia, anemia ,   Anesthesia Other Findings   Reproductive/Obstetrics                             Anesthesia Physical  Anesthesia Plan  ASA: III  Anesthesia Plan: General   Post-op Pain Management:    Induction: Intravenous  PONV Risk Score and Plan: 2 and Ondansetron  Airway Management Planned: Oral ETT  Additional Equipment:   Intra-op Plan:   Post-operative Plan: Extubation in OR  Informed Consent: I have reviewed the patients History and Physical, chart, labs and discussed the procedure including the risks, benefits and alternatives for the proposed anesthesia with the patient or authorized representative who has indicated his/her understanding and acceptance.   Dental advisory given  Plan Discussed with: CRNA  Anesthesia Plan Comments:         Anesthesia Quick Evaluation

## 2018-06-13 ENCOUNTER — Encounter (HOSPITAL_COMMUNITY): Payer: Self-pay | Admitting: Anesthesiology

## 2018-06-13 ENCOUNTER — Inpatient Hospital Stay (HOSPITAL_COMMUNITY): Payer: Medicare HMO | Admitting: Anesthesiology

## 2018-06-13 ENCOUNTER — Encounter (HOSPITAL_COMMUNITY)
Admission: EM | Disposition: A | Discharge status: Home / Self Care | Payer: Self-pay | Source: Home / Self Care | Attending: Internal Medicine

## 2018-06-13 DIAGNOSIS — K8020 Calculus of gallbladder without cholecystitis without obstruction: Secondary | ICD-10-CM

## 2018-06-13 DIAGNOSIS — E7800 Pure hypercholesterolemia, unspecified: Secondary | ICD-10-CM

## 2018-06-13 HISTORY — PX: CHOLECYSTECTOMY: SHX55

## 2018-06-13 LAB — COMPREHENSIVE METABOLIC PANEL
ALT: 60 U/L — ABNORMAL HIGH (ref 0–44)
AST: 58 U/L — ABNORMAL HIGH (ref 15–41)
Albumin: 2.2 g/dL — ABNORMAL LOW (ref 3.5–5.0)
Alkaline Phosphatase: 221 U/L — ABNORMAL HIGH (ref 38–126)
Anion gap: 7 (ref 5–15)
BUN: 12 mg/dL (ref 8–23)
CHLORIDE: 105 mmol/L (ref 98–111)
CO2: 29 mmol/L (ref 22–32)
Calcium: 8.3 mg/dL — ABNORMAL LOW (ref 8.9–10.3)
Creatinine, Ser: 1.48 mg/dL — ABNORMAL HIGH (ref 0.44–1.00)
GFR calc Af Amer: 38 mL/min — ABNORMAL LOW (ref >60)
GFR calc non Af Amer: 33 mL/min — ABNORMAL LOW (ref >60)
Glucose, Bld: 180 mg/dL — ABNORMAL HIGH (ref 70–99)
Potassium: 5.3 mmol/L — ABNORMAL HIGH (ref 3.5–5.1)
Sodium: 141 mmol/L (ref 135–145)
Total Bilirubin: 2.2 mg/dL — ABNORMAL HIGH (ref 0.3–1.2)
Total Protein: 5 g/dL — ABNORMAL LOW (ref 6.5–8.1)

## 2018-06-13 LAB — GLUCOSE, CAPILLARY
Glucose-Capillary: 116 mg/dL — ABNORMAL HIGH (ref 70–99)
Glucose-Capillary: 120 mg/dL — ABNORMAL HIGH (ref 70–99)
Glucose-Capillary: 148 mg/dL — ABNORMAL HIGH (ref 70–99)
Glucose-Capillary: 170 mg/dL — ABNORMAL HIGH (ref 70–99)
Glucose-Capillary: 171 mg/dL — ABNORMAL HIGH (ref 70–99)
Glucose-Capillary: 291 mg/dL — ABNORMAL HIGH (ref 70–99)
Glucose-Capillary: 304 mg/dL — ABNORMAL HIGH (ref 70–99)

## 2018-06-13 LAB — CULTURE, BLOOD (ROUTINE X 2)

## 2018-06-13 LAB — CBC
HEMATOCRIT: 31.1 % — AB (ref 36.0–46.0)
Hemoglobin: 9.4 g/dL — ABNORMAL LOW (ref 12.0–15.0)
MCH: 30.9 pg (ref 26.0–34.0)
MCHC: 30.2 g/dL (ref 30.0–36.0)
MCV: 102.3 fL — ABNORMAL HIGH (ref 80.0–100.0)
Platelets: 187 10*3/uL (ref 150–400)
RBC: 3.04 MIL/uL — ABNORMAL LOW (ref 3.87–5.11)
RDW: 13.4 % (ref 11.5–15.5)
WBC: 5.3 10*3/uL (ref 4.0–10.5)
nRBC: 0 % (ref 0.0–0.2)

## 2018-06-13 SURGERY — LAPAROSCOPIC CHOLECYSTECTOMY
Anesthesia: General | Site: Abdomen

## 2018-06-13 MED ORDER — LIDOCAINE 2% (20 MG/ML) 5 ML SYRINGE
INTRAMUSCULAR | Status: AC
  Filled 2018-06-13: qty 5

## 2018-06-13 MED ORDER — DEXAMETHASONE SODIUM PHOSPHATE 10 MG/ML IJ SOLN
INTRAMUSCULAR | Status: AC
  Filled 2018-06-13: qty 1

## 2018-06-13 MED ORDER — ROCURONIUM BROMIDE 50 MG/5ML IV SOSY
PREFILLED_SYRINGE | INTRAVENOUS | Status: DC | PRN
  Administered 2018-06-13: 10 mg via INTRAVENOUS
  Administered 2018-06-13: 50 mg via INTRAVENOUS

## 2018-06-13 MED ORDER — SUGAMMADEX SODIUM 200 MG/2ML IV SOLN
INTRAVENOUS | Status: DC | PRN
  Administered 2018-06-13: 200 mg via INTRAVENOUS

## 2018-06-13 MED ORDER — ROCURONIUM BROMIDE 50 MG/5ML IV SOSY
PREFILLED_SYRINGE | INTRAVENOUS | Status: AC
  Filled 2018-06-13: qty 5

## 2018-06-13 MED ORDER — DEXAMETHASONE SODIUM PHOSPHATE 10 MG/ML IJ SOLN
INTRAMUSCULAR | Status: DC | PRN
  Administered 2018-06-13: 10 mg via INTRAVENOUS

## 2018-06-13 MED ORDER — ACETAMINOPHEN 500 MG PO TABS: 1000.0000 mg | ORAL_TABLET | Freq: Once | ORAL | Status: DC | PRN

## 2018-06-13 MED ORDER — ONDANSETRON HCL 4 MG/2ML IJ SOLN
INTRAMUSCULAR | Status: DC | PRN
  Administered 2018-06-13: 4 mg via INTRAVENOUS

## 2018-06-13 MED ORDER — BUPIVACAINE-EPINEPHRINE 0.25% -1:200000 IJ SOLN
INTRAMUSCULAR | Status: DC | PRN
  Administered 2018-06-13: 10 mL

## 2018-06-13 MED ORDER — INSULIN ASPART 100 UNIT/ML ~~LOC~~ SOLN
0.0000 [IU] | Freq: Three times a day (TID) | SUBCUTANEOUS | Status: DC
  Administered 2018-06-14 (×3): 2 [IU] via SUBCUTANEOUS

## 2018-06-13 MED ORDER — 0.9 % SODIUM CHLORIDE (POUR BTL) OPTIME
TOPICAL | Status: DC | PRN
  Administered 2018-06-13: 1000 mL

## 2018-06-13 MED ORDER — OXYCODONE HCL 5 MG PO TABS: 5.0000 mg | ORAL_TABLET | Freq: Once | ORAL | Status: DC | PRN

## 2018-06-13 MED ORDER — FENTANYL CITRATE (PF) 250 MCG/5ML IJ SOLN
INTRAMUSCULAR | Status: AC
  Filled 2018-06-13: qty 5

## 2018-06-13 MED ORDER — SODIUM CHLORIDE 0.9 % IR SOLN
Status: DC | PRN
  Administered 2018-06-13 (×2): 1000 mL

## 2018-06-13 MED ORDER — LIDOCAINE 2% (20 MG/ML) 5 ML SYRINGE
INTRAMUSCULAR | Status: DC | PRN
  Administered 2018-06-13: 60 mg via INTRAVENOUS

## 2018-06-13 MED ORDER — PROPOFOL 10 MG/ML IV BOLUS
INTRAVENOUS | Status: DC | PRN
  Administered 2018-06-13: 20 mg via INTRAVENOUS
  Administered 2018-06-13: 70 mg via INTRAVENOUS

## 2018-06-13 MED ORDER — FENTANYL CITRATE (PF) 100 MCG/2ML IJ SOLN
INTRAMUSCULAR | Status: AC
  Filled 2018-06-13: qty 2

## 2018-06-13 MED ORDER — INSULIN ASPART 100 UNIT/ML ~~LOC~~ SOLN
5.0000 [IU] | Freq: Once | SUBCUTANEOUS | Status: AC
  Administered 2018-06-13: 5 [IU] via SUBCUTANEOUS

## 2018-06-13 MED ORDER — LACTATED RINGERS IV SOLN
INTRAVENOUS | Status: DC
  Administered 2018-06-13: 08:00:00 via INTRAVENOUS

## 2018-06-13 MED ORDER — HEMOSTATIC AGENTS (NO CHARGE) OPTIME
TOPICAL | Status: DC | PRN
  Administered 2018-06-13: 1 via TOPICAL

## 2018-06-13 MED ORDER — ACETAMINOPHEN 160 MG/5ML PO SOLN: 1000.0000 mg | Freq: Once | ORAL | Status: DC | PRN

## 2018-06-13 MED ORDER — OXYCODONE HCL 5 MG/5ML PO SOLN: 5.0000 mg | Freq: Once | ORAL | Status: DC | PRN

## 2018-06-13 MED ORDER — FENTANYL CITRATE (PF) 100 MCG/2ML IJ SOLN
25.0000 ug | INTRAMUSCULAR | Status: DC | PRN
  Administered 2018-06-13 (×2): 25 ug via INTRAVENOUS

## 2018-06-13 MED ORDER — ONDANSETRON HCL 4 MG/2ML IJ SOLN
INTRAMUSCULAR | Status: AC
  Filled 2018-06-13: qty 2

## 2018-06-13 MED ORDER — BUPIVACAINE-EPINEPHRINE (PF) 0.25% -1:200000 IJ SOLN
INTRAMUSCULAR | Status: AC
  Filled 2018-06-13: qty 30

## 2018-06-13 MED ORDER — OXYCODONE HCL 5 MG PO TABS
5.0000 mg | ORAL_TABLET | Freq: Four times a day (QID) | ORAL | Status: DC | PRN
  Administered 2018-06-13 – 2018-06-15 (×4): 5 mg via ORAL
  Filled 2018-06-13 (×4): qty 1

## 2018-06-13 MED ORDER — ACETAMINOPHEN 10 MG/ML IV SOLN: 1000.0000 mg | Freq: Once | INTRAVENOUS | Status: DC | PRN

## 2018-06-13 MED ORDER — FENTANYL CITRATE (PF) 100 MCG/2ML IJ SOLN
INTRAMUSCULAR | Status: DC | PRN
  Administered 2018-06-13: 25 ug via INTRAVENOUS
  Administered 2018-06-13: 100 ug via INTRAVENOUS

## 2018-06-13 SURGICAL SUPPLY — 40 items
APL SKNCLS STERI-STRIP NONHPOA (GAUZE/BANDAGES/DRESSINGS) ×1
APPLIER CLIP ROT 10 11.4 M/L (STAPLE) ×2
APR CLP MED LRG 11.4X10 (STAPLE) ×1
BAG SPEC RTRVL LRG 6X4 10 (ENDOMECHANICALS) ×1
BENZOIN TINCTURE PRP APPL 2/3 (GAUZE/BANDAGES/DRESSINGS) ×2 IMPLANT
CANISTER SUCT 3000ML PPV (MISCELLANEOUS) ×2 IMPLANT
CHLORAPREP W/TINT 26ML (MISCELLANEOUS) ×2 IMPLANT
CLIP APPLIE ROT 10 11.4 M/L (STAPLE) ×1 IMPLANT
COVER SURGICAL LIGHT HANDLE (MISCELLANEOUS) ×2 IMPLANT
COVER WAND RF STERILE (DRAPES) ×2 IMPLANT
DRSG TEGADERM 2-3/8X2-3/4 SM (GAUZE/BANDAGES/DRESSINGS) ×3 IMPLANT
DRSG TEGADERM 4X4.75 (GAUZE/BANDAGES/DRESSINGS) ×1 IMPLANT
ELECT REM PT RETURN 9FT ADLT (ELECTROSURGICAL) ×2
ELECTRODE REM PT RTRN 9FT ADLT (ELECTROSURGICAL) ×1 IMPLANT
GAUZE SPONGE 2X2 8PLY STRL LF (GAUZE/BANDAGES/DRESSINGS) IMPLANT
GLOVE BIO SURGEON STRL SZ7 (GLOVE) ×2 IMPLANT
GLOVE BIOGEL PI IND STRL 7.5 (GLOVE) ×1 IMPLANT
GLOVE BIOGEL PI INDICATOR 7.5 (GLOVE) ×1
GOWN STRL REUS W/ TWL LRG LVL3 (GOWN DISPOSABLE) ×3 IMPLANT
GOWN STRL REUS W/TWL LRG LVL3 (GOWN DISPOSABLE) ×6
KIT BASIN OR (CUSTOM PROCEDURE TRAY) ×2 IMPLANT
KIT TURNOVER KIT B (KITS) ×2 IMPLANT
NS IRRIG 1000ML POUR BTL (IV SOLUTION) ×2 IMPLANT
PAD ARMBOARD 7.5X6 YLW CONV (MISCELLANEOUS) ×2 IMPLANT
POUCH SPECIMEN RETRIEVAL 10MM (ENDOMECHANICALS) ×2 IMPLANT
SCISSORS LAP 5X35 DISP (ENDOMECHANICALS) ×2 IMPLANT
SET IRRIG TUBING LAPAROSCOPIC (IRRIGATION / IRRIGATOR) ×2 IMPLANT
SET TUBE SMOKE EVAC HIGH FLOW (TUBING) ×2 IMPLANT
SLEEVE ENDOPATH XCEL 5M (ENDOMECHANICALS) ×2 IMPLANT
SPECIMEN JAR SMALL (MISCELLANEOUS) ×2 IMPLANT
SPONGE GAUZE 2X2 STER 10/PKG (GAUZE/BANDAGES/DRESSINGS) ×1
STRIP CLOSURE SKIN 1/2X4 (GAUZE/BANDAGES/DRESSINGS) ×1 IMPLANT
SURGICEL SNOW 2X4 (HEMOSTASIS) ×1 IMPLANT
SUT MNCRL AB 4-0 PS2 18 (SUTURE) ×2 IMPLANT
TOWEL OR 17X24 6PK STRL BLUE (TOWEL DISPOSABLE) ×2 IMPLANT
TRAY LAPAROSCOPIC MC (CUSTOM PROCEDURE TRAY) ×2 IMPLANT
TROCAR XCEL BLUNT TIP 100MML (ENDOMECHANICALS) ×2 IMPLANT
TROCAR XCEL NON-BLD 11X100MML (ENDOMECHANICALS) ×2 IMPLANT
TROCAR XCEL NON-BLD 5MMX100MML (ENDOMECHANICALS) ×2 IMPLANT
WATER STERILE IRR 1000ML POUR (IV SOLUTION) ×2 IMPLANT

## 2018-06-13 NOTE — Transfer of Care (Signed)
Immediate Anesthesia Transfer of Care Note  Patient: Morgan Hubbard  Procedure(s) Performed: LAPAROSCOPIC CHOLECYSTECTOMY (N/A Abdomen)  Patient Location: PACU  Anesthesia Type:General  Level of Consciousness: awake, alert  and oriented  Airway & Oxygen Therapy: Patient Spontanous Breathing and Patient connected to face mask oxygen  Post-op Assessment: Report given to RN, Post -op Vital signs reviewed and stable and Patient moving all extremities X 4  Post vital signs: Reviewed and stable  Last Vitals:  Vitals Value Taken Time  BP 127/60 06/13/2018 10:42 AM  Temp    Pulse 61 06/13/2018 10:43 AM  Resp 10 06/13/2018 10:43 AM  SpO2 100 % 06/13/2018 10:43 AM  Vitals shown include unvalidated device data.  Last Pain:  Vitals:   06/13/18 0820  TempSrc:   PainSc: 0-No pain         Complications: No apparent anesthesia complications

## 2018-06-13 NOTE — Anesthesia Preprocedure Evaluation (Addendum)
Anesthesia Evaluation  Patient identified by MRN, date of birth, ID band Patient awake    Reviewed: Allergy & Precautions, H&P , NPO status , Patient's Chart, lab work & pertinent test results, reviewed documented beta blocker date and time   History of Anesthesia Complications Negative for: history of anesthetic complications  Airway Mallampati: I   Neck ROM: Full  Mouth opening: Limited Mouth Opening  Dental  (+) Teeth Intact, Dental Advisory Given   Pulmonary shortness of breath,    breath sounds clear to auscultation       Cardiovascular hypertension, Pt. on medications and Pt. on home beta blockers (-) angina+ Peripheral Vascular Disease and +CHF  + dysrhythmias + Cardiac Defibrillator  Rhythm:Regular Rate:Normal     Neuro/Psych  Headaches,  Neuromuscular disease CVA    GI/Hepatic GERD  Medicated and Controlled,Fatty liver. Cholecystitis   Endo/Other  diabetes, Well Controlled, Type 2, Oral Hypoglycemic Agents  Renal/GU Renal InsufficiencyRenal diseasePt has only one kidney as she donated one.     Musculoskeletal   Abdominal Normal abdominal exam  (+)   Peds  Hematology  (+) Blood dyscrasia, anemia ,   Anesthesia Other Findings - Left ventricle: The cavity size was normal. Systolic function was   mildly to moderately reduced. The estimated ejection fraction was   in the range of 40% to 45%. Diffuse hypokinesis. Features are   consistent with a pseudonormal left ventricular filling pattern,   with concomitant abnormal relaxation and increased filling   pressure (grade 2 diastolic dysfunction). - Ventricular septum: Septal motion showed abnormal function,   dyssynergy, and paradox. These changes are consistent with   intraventricular conduction delay. - Mitral valve: There was mild to moderate regurgitation directed   centrally. - Right ventricle: Systolic function was mildly reduced. - Pulmonary arteries:  Systolic pressure was moderately increased.   PA peak pressure: 54 mm Hg (S). - Pericardium, extracardiac: A trivial pericardial effusion was   identified posterior to the heart. There was a left pleural   effusion.  Reproductive/Obstetrics                            Anesthesia Physical Anesthesia Plan  ASA: III  Anesthesia Plan: General   Post-op Pain Management:    Induction: Intravenous  PONV Risk Score and Plan: 3 and Ondansetron and Dexamethasone  Airway Management Planned: Oral ETT  Additional Equipment: None  Intra-op Plan:   Post-operative Plan: Extubation in OR  Informed Consent: I have reviewed the patients History and Physical, chart, labs and discussed the procedure including the risks, benefits and alternatives for the proposed anesthesia with the patient or authorized representative who has indicated his/her understanding and acceptance.   Dental advisory given  Plan Discussed with: CRNA and Surgeon  Anesthesia Plan Comments:         Anesthesia Quick Evaluation

## 2018-06-13 NOTE — Progress Notes (Signed)
Day of Surgery   Subjective/Chief Complaint: Assumed patient care today S/p ERCP No abdominal pain   Objective: Vital signs in last 24 hours: Temp:  [97.5 F (36.4 C)-98.4 F (36.9 C)] 97.9 F (36.6 C) (01/10 2045) Pulse Rate:  [59-74] 66 (01/11 0358) Resp:  [7-24] 16 (01/11 0358) BP: (119-196)/(38-84) 122/55 (01/11 0358) SpO2:  [91 %-100 %] 91 % (01/11 0358) Last BM Date: 06/10/18  Intake/Output from previous day: 01/10 0701 - 01/11 0700 In: 918.5 [I.V.:806.6; IV Piggyback:111.8] Out: 500 [Urine:500] Intake/Output this shift: Total I/O In: -  Out: 100 [Urine:100]  WDWN in NAD Abd - soft, non-tender   Lab Results:  Recent Labs    06/12/18 0610 06/13/18 0521  WBC 6.2 5.3  HGB 9.0* 9.4*  HCT 28.7* 31.1*  PLT 210 187   BMET Recent Labs    06/12/18 0610 06/13/18 0521  NA 137 141  K 3.5 5.3*  CL 103 105  CO2 26 29  GLUCOSE 65* 180*  BUN 11 12  CREATININE 1.16* 1.48*  CALCIUM 7.6* 8.3*   Hepatic Function Latest Ref Rng & Units 06/13/2018 06/12/2018 06/11/2018  Total Protein 6.5 - 8.1 g/dL 5.0(L) 5.2(L) 5.5(L)  Albumin 3.5 - 5.0 g/dL 2.2(L) 2.3(L) 2.7(L)  AST 15 - 41 U/L 58(H) 107(H) 264(H)  ALT 0 - 44 U/L 60(H) 81(H) 128(H)  Alk Phosphatase 38 - 126 U/L 221(H) 211(H) 256(H)  Total Bilirubin 0.3 - 1.2 mg/dL 2.2(H) 3.7(H) 4.3(H)  Bilirubin, Direct 0.0 - 0.2 mg/dL - - 2.7(H)    PT/INR Recent Labs    06/10/18 1713  LABPROT 12.6  INR 0.96   ABG No results for input(s): PHART, HCO3 in the last 72 hours.  Invalid input(s): PCO2, PO2  Studies/Results: Nm Hepatobiliary Liver Func  Result Date: 06/12/2018 CLINICAL DATA:  84 year old with cholelithiasis. EXAM: NUCLEAR MEDICINE HEPATOBILIARY IMAGING TECHNIQUE: Sequential images of the abdomen were obtained following intravenous administration of radiopharmaceutical. 2.6 mg of morphine was administered approximately 90 minutes after the radiopharmaceutical injection. Additional 30 minutes of imaging was  obtained after the morphine injection. RADIOPHARMACEUTICALS:  5.3 mCi Tc-36m Choletec IV COMPARISON:  Ultrasound 06/10/2018 FINDINGS: Prompt uptake and biliary excretion of activity in the liver. Activity identified in the small bowel compatible with a patent common bile duct. However, there is no activity identified in the gallbladder throughout this examination. IMPRESSION: No gallbladder uptake even after the administration of morphine. Findings are suggestive for cystic duct obstruction and concerning for cholecystitis. Electronically Signed   By: AMarkus DaftM.D.   On: 06/12/2018 11:20   Dg Ercp Biliary & Pancreatic Ducts  Result Date: 06/12/2018 CLINICAL DATA:  84year old female undergoing ERCP for possible choledocholithiasis EXAM: ERCP TECHNIQUE: Multiple spot images obtained with the fluoroscopic device and submitted for interpretation post-procedure. FLUOROSCOPY TIME:  Fluoroscopy Time:  1 minutes 35 seconds reported COMPARISON:  Nuclear medicine HIDA scan 06/12/2018 FINDINGS: A total of 4 images are submitted for review. The images demonstrate a flexible endoscope in the descending duodenum with wire cannulation of the left intrahepatic ducts. Cholangiogram demonstrates filling defects in the common bile duct consistent with choledocholithiasis. Subsequent images demonstrate sphincterotomy and balloon sweep of the common duct. Contrast is present within the duodenum on the final image. IMPRESSION: ERCP as above. These images were submitted for radiologic interpretation only. Please see the procedural report for the amount of contrast and the fluoroscopy time utilized. Electronically Signed   By: HJacqulynn CadetM.D.   On: 06/12/2018 14:33  Anti-infectives: Anti-infectives (From admission, onward)   Start     Dose/Rate Route Frequency Ordered Stop   06/11/18 2200  piperacillin-tazobactam (ZOSYN) IVPB 3.375 g  Status:  Discontinued     3.375 g 12.5 mL/hr over 240 Minutes Intravenous Every 8  hours 06/11/18 1603 06/11/18 1621   06/11/18 1800  [MAR Hold]  piperacillin-tazobactam (ZOSYN) IVPB 3.375 g     (MAR Hold since Sat 06/13/2018 at 0829. Reason: Transfer to a Procedural area.)   3.375 g 12.5 mL/hr over 240 Minutes Intravenous Every 8 hours 06/11/18 1621     06/11/18 1645  vancomycin (VANCOCIN) 1,500 mg in sodium chloride 0.9 % 500 mL IVPB  Status:  Discontinued     1,500 mg 250 mL/hr over 120 Minutes Intravenous NOW 06/11/18 1559 06/11/18 1602   06/11/18 0600  piperacillin-tazobactam (ZOSYN) IVPB 3.375 g  Status:  Discontinued     3.375 g 12.5 mL/hr over 240 Minutes Intravenous Every 12 hours 06/10/18 2124 06/11/18 1603   06/10/18 2300  [MAR Hold]  dapsone tablet 25 mg     (MAR Hold since Sat 06/13/2018 at 0829. Reason: Transfer to a Procedural area.)   25 mg Oral Daily 06/10/18 2257     06/10/18 2115  dapsone tablet 25 mg  Status:  Discontinued     25 mg Oral Daily 06/10/18 2112 06/10/18 2257   06/10/18 1930  metroNIDAZOLE (FLAGYL) IVPB 500 mg     500 mg 100 mL/hr over 60 Minutes Intravenous  Once 06/10/18 1924 06/10/18 2153   06/10/18 1800  ceFEPIme (MAXIPIME) 2 g in sodium chloride 0.9 % 100 mL IVPB     2 g 200 mL/hr over 30 Minutes Intravenous  Once 06/10/18 1746 06/10/18 2000      Assessment/Plan: s/p Procedure(s): LAPAROSCOPIC CHOLECYSTECTOMY (N/A) The surgical procedure has been discussed with the patient.  Potential risks, benefits, alternative treatments, and expected outcomes have been explained.  All of the patient's questions at this time have been answered.  The likelihood of reaching the patient's treatment goal is good.  The patient understand the proposed surgical procedure and wishes to proceed.   LOS: 3 days    Maia Petties 06/13/2018

## 2018-06-13 NOTE — Anesthesia Procedure Notes (Signed)
Procedure Name: Intubation Date/Time: 06/13/2018 9:11 AM Performed by: Kyung Rudd, CRNA Pre-anesthesia Checklist: Patient identified, Emergency Drugs available, Suction available, Patient being monitored and Timeout performed Patient Re-evaluated:Patient Re-evaluated prior to induction Oxygen Delivery Method: Circle system utilized Preoxygenation: Pre-oxygenation with 100% oxygen Induction Type: IV induction Ventilation: Mask ventilation without difficulty Laryngoscope Size: Mac and 3 Grade View: Grade I Tube type: Oral Tube size: 7.0 mm Number of attempts: 1 Airway Equipment and Method: Stylet Placement Confirmation: ETT inserted through vocal cords under direct vision,  positive ETCO2 and breath sounds checked- equal and bilateral Secured at: 21 cm Tube secured with: Tape Dental Injury: Teeth and Oropharynx as per pre-operative assessment

## 2018-06-13 NOTE — Op Note (Signed)
Laparoscopic Cholecystectomy Procedure Note  Indications: Choledocholithiasis s/p ERCP, positive HIDA scan  Pre-operative Diagnosis: Calculus of bile duct with acute cholecystitis and obstruction  Post-operative Diagnosis: Same  Surgeon: Maia Petties   Assistants: none  Anesthesia: General endotracheal anesthesia  ASA Class: 2  Procedure Details  The patient was seen again in the Holding Room. The risks, benefits, complications, treatment options, and expected outcomes were discussed with the patient. The possibilities of reaction to medication, pulmonary aspiration, perforation of viscus, bleeding, recurrent infection, finding a normal gallbladder, the need for additional procedures, failure to diagnose a condition, the possible need to convert to an open procedure, and creating a complication requiring transfusion or operation were discussed with the patient. The likelihood of improving the patient's symptoms with return to their baseline status is good.  The patient and/or family concurred with the proposed plan, giving informed consent. The site of surgery properly noted. The patient was taken to Operating Room, identified as Morgan Hubbard and the procedure verified as Laparoscopic Cholecystectomy with Intraoperative Cholangiogram. A Time Out was held and the above information confirmed.  Prior to the induction of general anesthesia, antibiotic prophylaxis was administered. General endotracheal anesthesia was then administered and tolerated well. After the induction, the abdomen was prepped with Chloraprep and draped in sterile fashion. The patient was positioned in the supine position.  Local anesthetic agent was injected into the skin near the umbilicus and an incision made. We dissected down to the abdominal fascia with blunt dissection.  The fascia was incised vertically and we entered the peritoneal cavity bluntly.  A pursestring suture of 0-Vicryl was placed around the fascial  opening.  The Hasson cannula was inserted and secured with the stay suture.  Pneumoperitoneum was then created with CO2 and tolerated well without any adverse changes in the patient's vital signs. An 11-mm port was placed in the subxiphoid position.  Two 5-mm ports were placed in the right upper quadrant. All skin incisions were infiltrated with a local anesthetic agent before making the incision and placing the trocars.   We positioned the patient in reverse Trendelenburg, tilted slightly to the patient's left.  The gallbladder was identified, the fundus grasped and retracted cephalad. There were some flimsy adhesions to the hilum of the gallbladder.  Adhesions were lysed bluntly and with the electrocautery where indicated, taking care not to injure any adjacent organs or viscus. The infundibulum was grasped and retracted laterally, exposing the peritoneum overlying the triangle of Calot. This was then divided and exposed in a blunt fashion. The cystic duct was clearly identified and bluntly dissected circumferentially. A critical view of the cystic duct and cystic artery was obtained.  The cystic duct was then ligated with clips and divided. The cystic artery was, dissected free, ligated with clips and divided as well.   The gallbladder was dissected from the liver bed in retrograde fashion with the electrocautery.  There was a venous lake in the gallbladder bed that required additional cautery. The gallbladder was removed and placed in an Endocatch sac. The liver bed was irrigated and inspected. Hemostasis was achieved with the electrocautery and SNOW. Copious irrigation was utilized and was repeatedly aspirated until clear.  The gallbladder and Endocatch sac were then removed through the umbilical port site.  The pursestring suture was used to close the umbilical fascia.    We again inspected the right upper quadrant for hemostasis.  Pneumoperitoneum was released as we removed the trocars.  4-0 Monocryl  was used to  close the skin.   Benzoin, steri-strips, and clean dressings were applied. The patient was then extubated and brought to the recovery room in stable condition. Instrument, sponge, and needle counts were correct at closure and at the conclusion of the case.   Findings: Cholecystitis with Cholelithiasis  Estimated Blood Loss: less than 50 mL         Drains: none         Specimens: Gallbladder           Complications: None; patient tolerated the procedure well.         Disposition: PACU - hemodynamically stable.         Condition: stable  Imogene Burn. Georgette Dover, MD, [place] [facility] Beeper [phone]  06/13/2018 10:34 AM

## 2018-06-13 NOTE — Progress Notes (Signed)
Pt CBG-304, notified on call X.Blount,NP received order to give Novolog 5units. Also pt requests to get all morning meds especially cancer meds that she was not able to get d/t surgery, per X. Blount,NP ok to give meds previously held thus morning.

## 2018-06-13 NOTE — Progress Notes (Signed)
Mickie Bail with [employer] regarding management of patient's ICD.  He saw patient yesterday and states that since they only placed a magnet on it during surgery and then removed it, nothing additional needs to be done.

## 2018-06-13 NOTE — Progress Notes (Signed)
PROGRESS NOTE    Morgan Hubbard  YWV:[id number] DOB: 04-07-1935 DOA: 06/10/2018 PCP: Abner Greenspan, MD   Brief Narrative:  HPI on 06/10/2018 by Dr. Ivor Costa Morgan Hubbard is a 84 y.o. female with medical history significant of hypertension, hyperlipidemia, diabetes mellitus, stroke, GERD, left bundle blockage, migraine headache, AICD placement, vertigo, solitary kidney, CHF with EF of 15%, breast cancer (lumpectomy), who presents with fever, chills, abdominal pain, nausea and vomiting.  Patient states that her symptoms started yesterday, including fever, chills, abdominal pain, nausea vomiting.  Her abdominal pain is located in the upper abdomen, constant, mild, 3 out of 10 severity, sharp, nonradiating.  It is associated with nausea and vomiting.  She has had nonbilious, nonbloody vomitus at least 5 times.  No diarrhea. She had chronic mild shortness of breath due to CHF, which has not changed.  No chest pain, cough.  She has body aches, but no runny nose or sore throat.  Denies symptoms of UTI or unilateral weakness.  Of note, patient states that she cannot take Tylenol or NSAIDs.  She takes aspirin for fever in the past.  Interim history Patient admitted with abdominal pain found to have sepsis due to cholecystitis.  Underwent ERCP.  Status post laparoscopic cholecystectomy this morning.  Also found to have enterococcal bacteremia, infectious disease following, currently on Zosyn.  Assessment & Plan   Sepsis abdominal pain secondary to cholecystitis/cholelithiasis/bacteremia -Patient admitted with fever, tachypnea and elevated lactic acid of 2.93 -Currently hemodynamically stable -HIDA scan positive -Gastroenterology consulted and appreciated, status post ERCP -General surgery consulted and appreciated, status post laparoscopic cholecystectomy today -Continue Zosyn, patient did receive 1 dose of cefepime and Flagyl in the emergency department -Blood cultures positive for enterococcus  faecium -Infectious disease consulted and appreciated, recommending continuation of Zosyn with echocardiogram and repeat cultures -Repeat cultures on 06/12/2018 show no growth to date -Echocardiogram negative for vegetation -Continue pain control, IV fluid  Enterococcal bacteremia -Treatment plan as above  Elevated LFTs -Secondary to the above -Left he is trending downward, continue to monitor  Acute kidney injury on chronic kidney disease, stage III -Baseline creatinine 0.97 (in 2018), patient presented with creatinine 1.24 which peaked to 1.48 -GFR still within stage III -Continue to monitor BMP  Chronic systolic/diastolic heart failure -Echocardiogram showed an EF of 40 to 45%.  Diffuse hypokinesis, grade 2 diastolic dysfunction.  No evidence of vegetation.  Marked improvement in LV function since 2015. -appears to be euvolemic and compensated -Continue to monitor intake and output, daily weights  Diabetes mellitus, type II -Hemoglobin A1c 7.7 on 05/02/2018 -Currently on insulin sliding scale with CBG monitoring -Metformin and Amaryl held  Hypercholesteremia -Lipitor was held due to abnormal liver function  GERD -Continue PPI  DVT Prophylaxis  Heparin  Code Status: Full  Family Communication: None at bedside  Disposition Plan: Admitted. Dispo TBD  Consultants Gastroenterology General surgery Infectious disease  Procedures  Echocardiogram HIDA scan ERCP Laparoscopic cholecystectomy  Antibiotics   Anti-infectives (From admission, onward)   Start     Dose/Rate Route Frequency Ordered Stop   06/11/18 2200  piperacillin-tazobactam (ZOSYN) IVPB 3.375 g  Status:  Discontinued     3.375 g 12.5 mL/hr over 240 Minutes Intravenous Every 8 hours 06/11/18 1603 06/11/18 1621   06/11/18 1800  piperacillin-tazobactam (ZOSYN) IVPB 3.375 g     3.375 g 12.5 mL/hr over 240 Minutes Intravenous Every 8 hours 06/11/18 1621     06/11/18 1645  vancomycin (VANCOCIN) 1,500 mg in  sodium chloride 0.9 % 500 mL IVPB  Status:  Discontinued     1,500 mg 250 mL/hr over 120 Minutes Intravenous NOW 06/11/18 1559 06/11/18 1602   06/11/18 0600  piperacillin-tazobactam (ZOSYN) IVPB 3.375 g  Status:  Discontinued     3.375 g 12.5 mL/hr over 240 Minutes Intravenous Every 12 hours 06/10/18 2124 06/11/18 1603   06/10/18 2300  dapsone tablet 25 mg     25 mg Oral Daily 06/10/18 2257     06/10/18 2115  dapsone tablet 25 mg  Status:  Discontinued     25 mg Oral Daily 06/10/18 2112 06/10/18 2257   06/10/18 1930  metroNIDAZOLE (FLAGYL) IVPB 500 mg     500 mg 100 mL/hr over 60 Minutes Intravenous  Once 06/10/18 1924 06/10/18 2153   06/10/18 1800  ceFEPIme (MAXIPIME) 2 g in sodium chloride 0.9 % 100 mL IVPB     2 g 200 mL/hr over 30 Minutes Intravenous  Once 06/10/18 1746 06/10/18 2000      Subjective:   Morgan Hubbard seen and examined today.  Patient has no complaints this morning.  Denies current chest pain, shortness breath, abdominal pain, nausea or vomiting, diarrhea or constipation, dizziness or headache.  Objective:   Vitals:   06/13/18 1057 06/13/18 1112 06/13/18 1127 06/13/18 1159  BP: 128/60 (!) 147/64 (!) 135/56 131/74  Pulse: 63 60 (!) 59 62  Resp: 12 12 13 14   Temp:   98.3 F (36.8 C) 97.7 F (36.5 C)  TempSrc:    Oral  SpO2: 95% 99% 98% 99%  Weight:      Height:        Intake/Output Summary (Last 24 hours) at 06/13/2018 1259 Last data filed at 06/13/2018 1044 Gross per 24 hour  Intake 1318.46 ml  Output 675 ml  Net 643.46 ml   Filed Weights   06/11/18 2148  Weight: 64.2 kg    Exam  General: Well developed, well nourished, NAD, appears stated age  HEENT: NCAT, mucous membranes moist.   Neck: Supple  Cardiovascular: S1 S2 auscultated, no rubs, murmurs or gallops. Regular rate and rhythm.  Respiratory: Clear to auscultation bilaterally with equal chest rise  Abdomen: Soft, nontender, nondistended, + bowel sounds  Extremities: warm dry without  cyanosis clubbing or edema  Neuro: AAOx3, nonfocal  Psych: Normal affect and demeanor with intact judgement and insight   Data Reviewed: I have personally reviewed following labs and imaging studies  CBC: Recent Labs  Lab 06/09/18 1218 06/10/18 1713 06/11/18 0221 06/12/18 0610 06/13/18 0521  WBC 4.9 6.1 12.3* 6.2 5.3  NEUTROABS 2.4 4.6  --   --   --   HGB 11.4* 11.0* 9.7* 9.0* 9.4*  HCT 34.9* 35.8* 31.7* 28.7* 31.1*  MCV 96.9 98.6 100.0 99.3 102.3*  PLT 281.0 259 231 210 973   Basic Metabolic Panel: Recent Labs  Lab 06/10/18 1713 06/11/18 0221 06/12/18 0610 06/13/18 0521  NA 143 141 137 141  K 3.8 4.2 3.5 5.3*  CL 104 102 103 105  CO2 29 29 26 29   GLUCOSE 159* 198* 65* 180*  BUN 11 15 11 12   CREATININE 1.24* 1.48* 1.16* 1.48*  CALCIUM 9.1 8.3* 7.6* 8.3*   GFR: Estimated Creatinine Clearance: 25.6 mL/min (A) (by C-G formula based on SCr of 1.48 mg/dL (H)). Liver Function Tests: Recent Labs  Lab 06/09/18 1218 06/10/18 1713 06/11/18 0221 06/12/18 0610 06/13/18 0521  AST 29 270* 264* 107* 58*  ALT 41* 105* 128* 81* 60*  ALKPHOS 255* 316* 256* 211* 221*  BILITOT 1.0 3.2* 4.3* 3.7* 2.2*  PROT 6.0 6.2* 5.5* 5.2* 5.0*  ALBUMIN 3.4* 3.0* 2.7* 2.3* 2.2*   Recent Labs  Lab 06/11/18 0221  LIPASE 21   No results for input(s): AMMONIA in the last 168 hours. Coagulation Profile: Recent Labs  Lab 06/10/18 1713  INR 0.96   Cardiac Enzymes: No results for input(s): CKTOTAL, CKMB, CKMBINDEX, TROPONINI in the last 168 hours. BNP (last 3 results) No results for input(s): PROBNP in the last 8760 hours. HbA1C: No results for input(s): HGBA1C in the last 72 hours. CBG: Recent Labs  Lab 06/13/18 0003 06/13/18 0353 06/13/18 0817 06/13/18 1042 06/13/18 1154  GLUCAP 170* 171* 148* 120* 116*   Lipid Profile: No results for input(s): CHOL, HDL, LDLCALC, TRIG, CHOLHDL, LDLDIRECT in the last 72 hours. Thyroid Function Tests: No results for input(s): TSH,  T4TOTAL, FREET4, T3FREE, THYROIDAB in the last 72 hours. Anemia Panel: No results for input(s): VITAMINB12, FOLATE, FERRITIN, TIBC, IRON, RETICCTPCT in the last 72 hours. Urine analysis:    Component Value Date/Time   COLORURINE AMBER (A) 06/10/2018 1840   APPEARANCEUR CLEAR 06/10/2018 1840   LABSPEC 1.017 06/10/2018 1840   PHURINE 7.0 06/10/2018 1840   GLUCOSEU NEGATIVE 06/10/2018 1840   HGBUR NEGATIVE 06/10/2018 1840   BILIRUBINUR NEGATIVE 06/10/2018 1840   BILIRUBINUR neg. 03/01/2014 0829   KETONESUR NEGATIVE 06/10/2018 1840   PROTEINUR 100 (A) 06/10/2018 1840   UROBILINOGEN 0.2 03/01/2014 0829   UROBILINOGEN 0.2 01/13/2014 1102   NITRITE NEGATIVE 06/10/2018 1840   LEUKOCYTESUR NEGATIVE 06/10/2018 1840   Sepsis Labs: @LABRCNTIP (procalcitonin:4,lacticidven:4)  ) Recent Results (from the past 240 hour(s))  Culture, blood (Routine x 2)     Status: None (Preliminary result)   Collection Time: 06/10/18  5:12 PM  Result Value Ref Range Status   Specimen Description SITE NOT SPECIFIED  Final   Special Requests   Final    BOTTLES DRAWN AEROBIC AND ANAEROBIC Blood Culture adequate volume   Culture   Final    NO GROWTH 3 DAYS Performed at [facility], [place] [address] [place], [place] [address]    Report Status PENDING  Incomplete  Culture, blood (Routine x 2)     Status: Abnormal   Collection Time: 06/10/18  5:38 PM  Result Value Ref Range Status   Specimen Description BLOOD LEFT ANTECUBITAL  Final   Special Requests   Final    BOTTLES DRAWN AEROBIC AND ANAEROBIC Blood Culture results may not be optimal due to an excessive volume of blood received in culture bottles   Culture  Setup Time   Final    GRAM POSITIVE COCCI AEROBIC BOTTLE ONLY CRITICAL RESULT CALLED TO, READ BACK BY AND VERIFIED WITH: Morgan Hubbard PharmD 15:40 06/11/18 ([email or web address]) Performed at [facility], [address] [place], [place] [address]    Culture ENTEROCOCCUS FAECIUM (A)  Final   Report Status  06/13/2018 FINAL  Final   Organism ID, Bacteria ENTEROCOCCUS FAECIUM  Final      Susceptibility   Enterococcus faecium - MIC*    AMPICILLIN <=2 SENSITIVE Sensitive     VANCOMYCIN <=0.5 SENSITIVE Sensitive     GENTAMICIN SYNERGY SENSITIVE Sensitive     * ENTEROCOCCUS FAECIUM  Blood Culture ID Panel (Reflexed)     Status: Abnormal   Collection Time: 06/10/18  5:38 PM  Result Value Ref Range Status   Enterococcus species DETECTED (A) NOT DETECTED Final    Comment:  CRITICAL RESULT CALLED TO, READ BACK BY AND VERIFIED WITH: Morgan Hubbard PharmD 15:40 06/11/18 ([email or web address])    Vancomycin resistance NOT DETECTED NOT DETECTED Final   Listeria monocytogenes NOT DETECTED NOT DETECTED Final   Staphylococcus species NOT DETECTED NOT DETECTED Final   Staphylococcus aureus (BCID) NOT DETECTED NOT DETECTED Final   Streptococcus species NOT DETECTED NOT DETECTED Final   Streptococcus agalactiae NOT DETECTED NOT DETECTED Final   Streptococcus pneumoniae NOT DETECTED NOT DETECTED Final   Streptococcus pyogenes NOT DETECTED NOT DETECTED Final   Acinetobacter baumannii NOT DETECTED NOT DETECTED Final   Enterobacteriaceae species NOT DETECTED NOT DETECTED Final   Enterobacter cloacae complex NOT DETECTED NOT DETECTED Final   Escherichia coli NOT DETECTED NOT DETECTED Final   Klebsiella oxytoca NOT DETECTED NOT DETECTED Final   Klebsiella pneumoniae NOT DETECTED NOT DETECTED Final   Proteus species NOT DETECTED NOT DETECTED Final   Serratia marcescens NOT DETECTED NOT DETECTED Final   Haemophilus influenzae NOT DETECTED NOT DETECTED Final   Neisseria meningitidis NOT DETECTED NOT DETECTED Final   Pseudomonas aeruginosa NOT DETECTED NOT DETECTED Final   Candida albicans NOT DETECTED NOT DETECTED Final   Candida glabrata NOT DETECTED NOT DETECTED Final   Candida krusei NOT DETECTED NOT DETECTED Final   Candida parapsilosis NOT DETECTED NOT DETECTED Final   Candida tropicalis NOT DETECTED NOT DETECTED Final     Comment: Performed at [facility], [address], [place], [place] [address]  Culture, blood (routine x 2)     Status: None (Preliminary result)   Collection Time: 06/12/18  6:10 AM  Result Value Ref Range Status   Specimen Description BLOOD RIGHT HAND  Final   Special Requests   Final    BOTTLES DRAWN AEROBIC AND ANAEROBIC Blood Culture results may not be optimal due to an excessive volume of blood received in culture bottles   Culture   Final    NO GROWTH 1 DAY Performed at [facility], [place] [address] [place], [place] [address]    Report Status PENDING  Incomplete  Culture, blood (routine x 2)     Status: None (Preliminary result)   Collection Time: 06/12/18  6:20 AM  Result Value Ref Range Status   Specimen Description BLOOD LEFT HAND  Final   Special Requests   Final    BOTTLES DRAWN AEROBIC AND ANAEROBIC Blood Culture results may not be optimal due to an excessive volume of blood received in culture bottles   Culture   Final    NO GROWTH < 24 HOURS Performed at [facility], [place] [address], [place], [place] [address]    Report Status PENDING  Incomplete  Surgical pcr screen     Status: None   Collection Time: 06/12/18  8:44 PM  Result Value Ref Range Status   MRSA, PCR NEGATIVE NEGATIVE Final   Staphylococcus aureus NEGATIVE NEGATIVE Final    Comment: (NOTE) The Xpert SA Assay (FDA approved for NASAL specimens in patients 74 years of age and older), is one component of a comprehensive surveillance program. It is not intended to diagnose infection nor to guide or monitor treatment. Performed at [facility], [place] [address], [place], [place] [address]       Radiology Studies: Nm Hepatobiliary Liver Func  Result Date: 06/12/2018 CLINICAL DATA:  84 year old with cholelithiasis. EXAM: NUCLEAR MEDICINE HEPATOBILIARY IMAGING TECHNIQUE: Sequential images of the abdomen were obtained following intravenous administration of radiopharmaceutical. 2.6 mg  of  morphine was administered approximately 90 minutes after the radiopharmaceutical injection. Additional 30 minutes of imaging was obtained after the morphine injection. RADIOPHARMACEUTICALS:  5.3 mCi Tc-24m  Choletec IV COMPARISON:  Ultrasound 06/10/2018 FINDINGS: Prompt uptake and biliary excretion of activity in the liver. Activity identified in the small bowel compatible with a patent common bile duct. However, there is no activity identified in the gallbladder throughout this examination. IMPRESSION: No gallbladder uptake even after the administration of morphine. Findings are suggestive for cystic duct obstruction and concerning for cholecystitis. Electronically Signed   By: Markus Daft M.D.   On: 06/12/2018 11:20   Dg Ercp Biliary & Pancreatic Ducts  Result Date: 06/12/2018 CLINICAL DATA:  84 year old female undergoing ERCP for possible choledocholithiasis EXAM: ERCP TECHNIQUE: Multiple spot images obtained with the fluoroscopic device and submitted for interpretation post-procedure. FLUOROSCOPY TIME:  Fluoroscopy Time:  1 minutes 35 seconds reported COMPARISON:  Nuclear medicine HIDA scan 06/12/2018 FINDINGS: A total of 4 images are submitted for review. The images demonstrate a flexible endoscope in the descending duodenum with wire cannulation of the left intrahepatic ducts. Cholangiogram demonstrates filling defects in the common bile duct consistent with choledocholithiasis. Subsequent images demonstrate sphincterotomy and balloon sweep of the common duct. Contrast is present within the duodenum on the final image. IMPRESSION: ERCP as above. These images were submitted for radiologic interpretation only. Please see the procedural report for the amount of contrast and the fluoroscopy time utilized. Electronically Signed   By: Jacqulynn Cadet M.D.   On: 06/12/2018 14:33     Scheduled Meds: . carvedilol  12.5 mg Oral BID WC  . cholecalciferol  1,000 Units Oral Daily  . dapsone  25 mg Oral Daily   . fentaNYL      . gabapentin  300 mg Oral TID  . heparin injection (subcutaneous)  5,000 Units Subcutaneous Q8H  . insulin aspart  0-5 Units Subcutaneous Q4H  . isosorbide mononitrate  30 mg Oral Daily  . letrozole  2.5 mg Oral Daily  . loratadine  10 mg Oral Daily  .  morphine injection  4 mg Intravenous Once  . ondansetron (ZOFRAN) IV  4 mg Intravenous Once  . pantoprazole  40 mg Oral Daily  . vitamin B-12  1,000 mcg Oral Daily   Continuous Infusions: . dextrose 5 % and 0.9% NaCl 50 mL/hr at 06/13/18 0300  . lactated ringers    . piperacillin-tazobactam (ZOSYN)  IV 3.375 g (06/13/18 0137)     LOS: 3 days   Time Spent in minutes   30 minutes  Greenleigh Kauth D.O. on 06/13/2018 at 12:59 PM  Between 7am to 7pm - Please see pager noted on [email or web address]  After 7pm go to [email or web address]  And look for the night coverage person covering for me after hours  [facility] Office  [phone]

## 2018-06-14 ENCOUNTER — Inpatient Hospital Stay (HOSPITAL_COMMUNITY): Payer: Medicare HMO

## 2018-06-14 ENCOUNTER — Encounter (HOSPITAL_COMMUNITY): Payer: Self-pay | Admitting: Gastroenterology

## 2018-06-14 LAB — COMPREHENSIVE METABOLIC PANEL
ALK PHOS: 192 U/L — AB (ref 38–126)
ALT: 66 U/L — ABNORMAL HIGH (ref 0–44)
AST: 90 U/L — ABNORMAL HIGH (ref 15–41)
Albumin: 2.1 g/dL — ABNORMAL LOW (ref 3.5–5.0)
Anion gap: 6 (ref 5–15)
BUN: 15 mg/dL (ref 8–23)
CO2: 25 mmol/L (ref 22–32)
Calcium: 7.8 mg/dL — ABNORMAL LOW (ref 8.9–10.3)
Chloride: 104 mmol/L (ref 98–111)
Creatinine, Ser: 1.57 mg/dL — ABNORMAL HIGH (ref 0.44–1.00)
GFR calc Af Amer: 35 mL/min — ABNORMAL LOW (ref >60)
GFR calc non Af Amer: 31 mL/min — ABNORMAL LOW (ref >60)
Glucose, Bld: 323 mg/dL — ABNORMAL HIGH (ref 70–99)
Potassium: 4.6 mmol/L (ref 3.5–5.1)
Sodium: 135 mmol/L (ref 135–145)
Total Bilirubin: 1.2 mg/dL (ref 0.3–1.2)
Total Protein: 5 g/dL — ABNORMAL LOW (ref 6.5–8.1)

## 2018-06-14 LAB — GLUCOSE, CAPILLARY
GLUCOSE-CAPILLARY: 244 mg/dL — AB (ref 70–99)
Glucose-Capillary: 296 mg/dL — ABNORMAL HIGH (ref 70–99)
Glucose-Capillary: 289 mg/dL — ABNORMAL HIGH (ref 70–99)
Glucose-Capillary: 282 mg/dL — ABNORMAL HIGH (ref 70–99)
Glucose-Capillary: 263 mg/dL — ABNORMAL HIGH (ref 70–99)

## 2018-06-14 LAB — SODIUM, URINE, RANDOM: Sodium, Ur: 62 mmol/L

## 2018-06-14 LAB — HEMOGLOBIN AND HEMATOCRIT, BLOOD
HCT: 30.3 % — ABNORMAL LOW (ref 36.0–46.0)
Hemoglobin: 9.5 g/dL — ABNORMAL LOW (ref 12.0–15.0)

## 2018-06-14 LAB — OSMOLALITY, URINE: Osmolality, Ur: 530 mOsm/kg (ref 300–900)

## 2018-06-14 LAB — OSMOLALITY: Osmolality: 302 mOsm/kg — ABNORMAL HIGH (ref 275–295)

## 2018-06-14 MED ORDER — OXYCODONE HCL 5 MG PO TABS: 5.0000 mg | ORAL_TABLET | Freq: Four times a day (QID) | ORAL | 0 refills | Status: DC | PRN

## 2018-06-14 MED ORDER — SODIUM CHLORIDE 0.9 % IV SOLN
INTRAVENOUS | Status: DC
  Administered 2018-06-14 (×2): via INTRAVENOUS

## 2018-06-14 MED ORDER — SODIUM CHLORIDE 0.9 % IV SOLN
1.0000 g | Freq: Three times a day (TID) | INTRAVENOUS | Status: DC
  Administered 2018-06-14 – 2018-06-15 (×3): 1 g via INTRAVENOUS
  Filled 2018-06-14 (×4): qty 1000

## 2018-06-14 NOTE — Progress Notes (Signed)
PROGRESS NOTE    [name]  TFT:[id number] DOB: Mar 24, 1935 DOA: 06/10/2018 PCP: Abner Greenspan, MD   Brief Narrative:  HPI on 06/10/2018 by Dr. Ivor Costa Morgan Hubbard is a 84 y.o. female with medical history significant of hypertension, hyperlipidemia, diabetes mellitus, stroke, GERD, left bundle blockage, migraine headache, AICD placement, vertigo, solitary kidney, CHF with EF of 15%, breast cancer (lumpectomy), who presents with fever, chills, abdominal pain, nausea and vomiting.  Patient states that her symptoms started yesterday, including fever, chills, abdominal pain, nausea vomiting.  Her abdominal pain is located in the upper abdomen, constant, mild, 3 out of 10 severity, sharp, nonradiating.  It is associated with nausea and vomiting.  She has had nonbilious, nonbloody vomitus at least 5 times.  No diarrhea. She had chronic mild shortness of breath due to CHF, which has not changed.  No chest pain, cough.  She has body aches, but no runny nose or sore throat.  Denies symptoms of UTI or unilateral weakness.  Of note, patient states that she cannot take Tylenol or NSAIDs.  She takes aspirin for fever in the past.  Interim history Patient admitted with abdominal pain found to have sepsis due to cholecystitis.  Underwent ERCP.  Status post laparoscopic cholecystectomy this morning.  Also found to have enterococcal bacteremia, infectious disease following, currently on Zosyn.  Assessment & Plan   Sepsis abdominal pain secondary to cholecystitis/cholelithiasis/bacteremia -Patient admitted with fever, tachypnea and elevated lactic acid of 2.93 -Currently hemodynamically stable -HIDA scan positive -Gastroenterology consulted and appreciated, status post ERCP -General surgery consulted and appreciated, status post laparoscopic cholecystectomy today -Continue Zosyn, patient did receive 1 dose of cefepime and Flagyl in the emergency department -Blood cultures positive for enterococcus  faecium -Infectious disease consulted and appreciated, recommending continuation of Zosyn with echocardiogram and repeat cultures -Discussed with Dr. [name] (ID), no need for TEE, recommended Amoxicillin on discharge -Repeat cultures on 06/12/2018 show no growth to date -Echocardiogram negative for vegetation -Continue pain control, IV fluid -Will transition to amoxicillin today  Enterococcal bacteremia -Treatment plan as above  Elevated LFTs -Secondary to the above -Left he is trending downward, continue to monitor  Acute kidney injury on chronic kidney disease, stage III -Baseline creatinine 0.97 (in 2018), patient presented with creatinine 1.24 which peaked to 1.57 -patient with solitary kidney -GFR still within stage III however worse than baseline -will place patient on IVF, discontinue zosyn -patient did receive contrast, vancomycin, indocin -obtain renal US, urine electrolytes -Continue to monitor BMP  Chronic systolic/diastolic heart failure -Echocardiogram showed an EF of 40 to 45%.  Diffuse hypokinesis, grade 2 diastolic dysfunction.  No evidence of vegetation.  Marked improvement in LV function since 2015. -appears to be euvolemic and compensated -Continue to monitor intake and output, daily weights  Diabetes mellitus, type II -Hemoglobin A1c 7.7 on 05/02/2018 -Currently on insulin sliding scale with CBG monitoring -Metformin and Amaryl held  Hypercholesteremia -Lipitor was held due to abnormal liver function  GERD -Continue PPI  DVT Prophylaxis  Heparin  Code Status: Full  Family Communication: None at bedside  Disposition Plan: Admitted. Dispo TBD- suspect home when renal function improves  Consultants Gastroenterology General surgery Infectious disease  Procedures  Echocardiogram HIDA scan ERCP Laparoscopic cholecystectomy  Antibiotics   Anti-infectives (From admission, onward)   Start     Dose/Rate Route Frequency Ordered Stop   06/11/18  2200  piperacillin-tazobactam (ZOSYN) IVPB 3.375 g  Status:  Discontinued     3.375 g 12.5 mL/hr  over 240 Minutes Intravenous Every 8 hours 06/11/18 1603 06/11/18 1621   06/11/18 1800  piperacillin-tazobactam (ZOSYN) IVPB 3.375 g  Status:  Discontinued     3.375 g 12.5 mL/hr over 240 Minutes Intravenous Every 8 hours 06/11/18 1621 06/14/18 0911   06/11/18 1645  vancomycin (VANCOCIN) 1,500 mg in sodium chloride 0.9 % 500 mL IVPB  Status:  Discontinued     1,500 mg 250 mL/hr over 120 Minutes Intravenous NOW 06/11/18 1559 06/11/18 1602   06/11/18 0600  piperacillin-tazobactam (ZOSYN) IVPB 3.375 g  Status:  Discontinued     3.375 g 12.5 mL/hr over 240 Minutes Intravenous Every 12 hours 06/10/18 2124 06/11/18 1603   06/10/18 2300  dapsone tablet 25 mg     25 mg Oral Daily 06/10/18 2257     06/10/18 2115  dapsone tablet 25 mg  Status:  Discontinued     25 mg Oral Daily 06/10/18 2112 06/10/18 2257   06/10/18 1930  metroNIDAZOLE (FLAGYL) IVPB 500 mg     500 mg 100 mL/hr over 60 Minutes Intravenous  Once 06/10/18 1924 06/10/18 2153   06/10/18 1800  ceFEPIme (MAXIPIME) 2 g in sodium chloride 0.9 % 100 mL IVPB     2 g 200 mL/hr over 30 Minutes Intravenous  Once 06/10/18 1746 06/10/18 2000      Subjective:   [employer] seen and examined today.  Patient complains about her food.  She currently denies chest pain, shortness breath, abdominal pain, nausea or vomiting, diarrhea or constipation, dizziness or headache. Objective:   Vitals:   06/13/18 2015 06/14/18 0235 06/14/18 0545 06/14/18 0739  BP: (!) 146/59 (!) 115/43 (!) 111/41 (!) 151/60  Pulse: 62 63 75 66  Resp: 20 18 18    Temp: 98.3 F (36.8 C) 97.9 F (36.6 C) 98.1 F (36.7 C)   TempSrc: Oral Oral Oral   SpO2: 95% 94% 90%   Weight:      Height:        Intake/Output Summary (Last 24 hours) at 06/14/2018 0920 Last data filed at 06/13/2018 1528 Gross per 24 hour  Intake 1174.06 ml  Output 75 ml  Net 1099.06 ml   Filed Weights    06/11/18 2148  Weight: 64.2 kg   Exam  General: Well developed, well nourished, NAD, appears stated age  25: NCAT, mucous membranes moist.   Neck: Supple  Cardiovascular: S1 S2 auscultated, +SEM, RRR  Respiratory: Clear to auscultation bilaterally with equal chest rise  Abdomen: Soft, mild TTP, nondistended, + bowel sounds  Extremities: warm dry without cyanosis clubbing or edema  Neuro: AAOx3, nonfocal  Psych: Appropriate mood and affect  Data Reviewed: I have personally reviewed following labs and imaging studies  CBC: Recent Labs  Lab 06/09/18 1218 06/10/18 1713 06/11/18 0221 06/12/18 0610 06/13/18 0521 06/14/18 0404  WBC 4.9 6.1 12.3* 6.2 5.3  --   NEUTROABS 2.4 4.6  --   --   --   --   HGB 11.4* 11.0* 9.7* 9.0* 9.4* 9.5*  HCT 34.9* 35.8* 31.7* 28.7* 31.1* 30.3*  MCV 96.9 98.6 100.0 99.3 102.3*  --   PLT 281.0 259 231 210 187  --    Basic Metabolic Panel: Recent Labs  Lab 06/10/18 1713 06/11/18 0221 06/12/18 0610 06/13/18 0521 06/14/18 0404  NA 143 141 137 141 135  K 3.8 4.2 3.5 5.3* 4.6  CL 104 102 103 105 104  CO2 29 29 26 29 25   GLUCOSE 159* 198* 65* 180* 323*  BUN  11 15 11 12 15   CREATININE 1.24* 1.48* 1.16* 1.48* 1.57*  CALCIUM 9.1 8.3* 7.6* 8.3* 7.8*   GFR: Estimated Creatinine Clearance: 24.1 mL/min (A) (by C-G formula based on SCr of 1.57 mg/dL (H)). Liver Function Tests: Recent Labs  Lab 06/10/18 1713 06/11/18 0221 06/12/18 0610 06/13/18 0521 06/14/18 0404  AST 270* 264* 107* 58* 90*  ALT 105* 128* 81* 60* 66*  ALKPHOS 316* 256* 211* 221* 192*  BILITOT 3.2* 4.3* 3.7* 2.2* 1.2  PROT 6.2* 5.5* 5.2* 5.0* 5.0*  ALBUMIN 3.0* 2.7* 2.3* 2.2* 2.1*   Recent Labs  Lab 06/11/18 0221  LIPASE 21   No results for input(s): AMMONIA in the last 168 hours. Coagulation Profile: Recent Labs  Lab 06/10/18 1713  INR 0.96   Cardiac Enzymes: No results for input(s): CKTOTAL, CKMB, CKMBINDEX, TROPONINI in the last 168 hours. BNP  (last 3 results) No results for input(s): PROBNP in the last 8760 hours. HbA1C: No results for input(s): HGBA1C in the last 72 hours. CBG: Recent Labs  Lab 06/13/18 1154 06/13/18 1621 06/13/18 2110 06/14/18 0058 06/14/18 0818  GLUCAP 116* 291* 304* 296* 289*   Lipid Profile: No results for input(s): CHOL, HDL, LDLCALC, TRIG, CHOLHDL, LDLDIRECT in the last 72 hours. Thyroid Function Tests: No results for input(s): TSH, T4TOTAL, FREET4, T3FREE, THYROIDAB in the last 72 hours. Anemia Panel: No results for input(s): VITAMINB12, FOLATE, FERRITIN, TIBC, IRON, RETICCTPCT in the last 72 hours. Urine analysis:    Component Value Date/Time   COLORURINE AMBER (A) 06/10/2018 1840   APPEARANCEUR CLEAR 06/10/2018 1840   LABSPEC 1.017 06/10/2018 1840   PHURINE 7.0 06/10/2018 1840   GLUCOSEU NEGATIVE 06/10/2018 1840   HGBUR NEGATIVE 06/10/2018 1840   BILIRUBINUR NEGATIVE 06/10/2018 1840   BILIRUBINUR neg. 03/01/2014 0829   KETONESUR NEGATIVE 06/10/2018 1840   PROTEINUR 100 (A) 06/10/2018 1840   UROBILINOGEN 0.2 03/01/2014 0829   UROBILINOGEN 0.2 01/13/2014 1102   NITRITE NEGATIVE 06/10/2018 1840   LEUKOCYTESUR NEGATIVE 06/10/2018 1840   Sepsis Labs: @LABRCNTIP (procalcitonin:4,lacticidven:4)  ) Recent Results (from the past 240 hour(s))  Culture, blood (Routine x 2)     Status: None (Preliminary result)   Collection Time: 06/10/18  5:12 PM  Result Value Ref Range Status   Specimen Description SITE NOT SPECIFIED  Final   Special Requests   Final    BOTTLES DRAWN AEROBIC AND ANAEROBIC Blood Culture adequate volume   Culture   Final    NO GROWTH 4 DAYS Performed at [facility], [place] [address], [place], [place] [address]    Report Status PENDING  Incomplete  Culture, blood (Routine x 2)     Status: Abnormal   Collection Time: 06/10/18  5:38 PM  Result Value Ref Range Status   Specimen Description BLOOD LEFT ANTECUBITAL  Final   Special Requests   Final    BOTTLES DRAWN  AEROBIC AND ANAEROBIC Blood Culture results may not be optimal due to an excessive volume of blood received in culture bottles   Culture  Setup Time   Final    GRAM POSITIVE COCCI AEROBIC BOTTLE ONLY CRITICAL RESULT CALLED TO, READ BACK BY AND VERIFIED WITH: Hughie Closs PharmD 15:40 06/11/18 ([email or web address]) Performed at [facility], [address] [place], [place] [address]    Culture ENTEROCOCCUS FAECIUM (A)  Final   Report Status 06/13/2018 FINAL  Final   Organism ID, Bacteria ENTEROCOCCUS FAECIUM  Final      Susceptibility   Enterococcus faecium -  MIC*    AMPICILLIN <=2 SENSITIVE Sensitive     VANCOMYCIN <=0.5 SENSITIVE Sensitive     GENTAMICIN SYNERGY SENSITIVE Sensitive     * ENTEROCOCCUS FAECIUM  Blood Culture ID Panel (Reflexed)     Status: Abnormal   Collection Time: 06/10/18  5:38 PM  Result Value Ref Range Status   Enterococcus species DETECTED (A) NOT DETECTED Final    Comment: CRITICAL RESULT CALLED TO, READ BACK BY AND VERIFIED WITH: Hughie Closs PharmD 15:40 06/11/18 ([email or web address])    Vancomycin resistance NOT DETECTED NOT DETECTED Final   Listeria monocytogenes NOT DETECTED NOT DETECTED Final   Staphylococcus species NOT DETECTED NOT DETECTED Final   Staphylococcus aureus (BCID) NOT DETECTED NOT DETECTED Final   Streptococcus species NOT DETECTED NOT DETECTED Final   Streptococcus agalactiae NOT DETECTED NOT DETECTED Final   Streptococcus pneumoniae NOT DETECTED NOT DETECTED Final   Streptococcus pyogenes NOT DETECTED NOT DETECTED Final   Acinetobacter baumannii NOT DETECTED NOT DETECTED Final   Enterobacteriaceae species NOT DETECTED NOT DETECTED Final   Enterobacter cloacae complex NOT DETECTED NOT DETECTED Final   Escherichia coli NOT DETECTED NOT DETECTED Final   Klebsiella oxytoca NOT DETECTED NOT DETECTED Final   Klebsiella pneumoniae NOT DETECTED NOT DETECTED Final   Proteus species NOT DETECTED NOT DETECTED Final   Serratia marcescens NOT DETECTED NOT DETECTED Final    Haemophilus influenzae NOT DETECTED NOT DETECTED Final   Neisseria meningitidis NOT DETECTED NOT DETECTED Final   Pseudomonas aeruginosa NOT DETECTED NOT DETECTED Final   Candida albicans NOT DETECTED NOT DETECTED Final   Candida glabrata NOT DETECTED NOT DETECTED Final   Candida krusei NOT DETECTED NOT DETECTED Final   Candida parapsilosis NOT DETECTED NOT DETECTED Final   Candida tropicalis NOT DETECTED NOT DETECTED Final    Comment: Performed at [facility], [address], [place], [place] [address]  Culture, blood (routine x 2)     Status: None (Preliminary result)   Collection Time: 06/12/18  6:10 AM  Result Value Ref Range Status   Specimen Description BLOOD RIGHT HAND  Final   Special Requests   Final    BOTTLES DRAWN AEROBIC AND ANAEROBIC Blood Culture results may not be optimal due to an excessive volume of blood received in culture bottles   Culture   Final    NO GROWTH 2 DAYS Performed at [facility], [place] [address] [place], [place] [address]    Report Status PENDING  Incomplete  Culture, blood (routine x 2)     Status: None (Preliminary result)   Collection Time: 06/12/18  6:20 AM  Result Value Ref Range Status   Specimen Description BLOOD LEFT HAND  Final   Special Requests   Final    BOTTLES DRAWN AEROBIC AND ANAEROBIC Blood Culture results may not be optimal due to an excessive volume of blood received in culture bottles   Culture   Final    NO GROWTH 2 DAYS Performed at [facility], [place] [address], [place],  [address]    Report Status PENDING  Incomplete  Surgical pcr screen     Status: None   Collection Time: 06/12/18  8:44 PM  Result Value Ref Range Status   MRSA, PCR NEGATIVE NEGATIVE Final   Staphylococcus aureus NEGATIVE NEGATIVE Final    Comment: (NOTE) The Xpert SA Assay (FDA approved for NASAL specimens in patients 75 years of age and older), is one component of a comprehensive surveillance program. It  is not intended to  diagnose infection nor to guide or monitor treatment. Performed at [facility], [place] [address] [place], [place] [address]       Radiology Studies: Nm Hepatobiliary Liver Func  Result Date: 06/12/2018 CLINICAL DATA:  84 year old with cholelithiasis. EXAM: NUCLEAR MEDICINE HEPATOBILIARY IMAGING TECHNIQUE: Sequential images of the abdomen were obtained following intravenous administration of radiopharmaceutical. 2.6 mg of morphine was administered approximately 90 minutes after the radiopharmaceutical injection. Additional 30 minutes of imaging was obtained after the morphine injection. RADIOPHARMACEUTICALS:  5.3 mCi Tc-15m  Choletec IV COMPARISON:  Ultrasound 06/10/2018 FINDINGS: Prompt uptake and biliary excretion of activity in the liver. Activity identified in the small bowel compatible with a patent common bile duct. However, there is no activity identified in the gallbladder throughout this examination. IMPRESSION: No gallbladder uptake even after the administration of morphine. Findings are suggestive for cystic duct obstruction and concerning for cholecystitis. Electronically Signed   By: Markus Daft M.D.   On: 06/12/2018 11:20   Dg Ercp Biliary & Pancreatic Ducts  Result Date: 06/12/2018 CLINICAL DATA:  84 year old female undergoing ERCP for possible choledocholithiasis EXAM: ERCP TECHNIQUE: Multiple spot images obtained with the fluoroscopic device and submitted for interpretation post-procedure. FLUOROSCOPY TIME:  Fluoroscopy Time:  1 minutes 35 seconds reported COMPARISON:  Nuclear medicine HIDA scan 06/12/2018 FINDINGS: A total of 4 images are submitted for review. The images demonstrate a flexible endoscope in the descending duodenum with wire cannulation of the left intrahepatic ducts. Cholangiogram demonstrates filling defects in the common bile duct consistent with choledocholithiasis. Subsequent images demonstrate sphincterotomy and balloon sweep of the common duct. Contrast is  present within the duodenum on the final image. IMPRESSION: ERCP as above. These images were submitted for radiologic interpretation only. Please see the procedural report for the amount of contrast and the fluoroscopy time utilized. Electronically Signed   By: Jacqulynn Cadet M.D.   On: 06/12/2018 14:33     Scheduled Meds: . carvedilol  12.5 mg Oral BID WC  . cholecalciferol  1,000 Units Oral Daily  . dapsone  25 mg Oral Daily  . gabapentin  300 mg Oral TID  . heparin injection (subcutaneous)  5,000 Units Subcutaneous Q8H  . insulin aspart  0-5 Units Subcutaneous TID WC  . isosorbide mononitrate  30 mg Oral Daily  . letrozole  2.5 mg Oral Daily  . loratadine  10 mg Oral Daily  .  morphine injection  4 mg Intravenous Once  . ondansetron (ZOFRAN) IV  4 mg Intravenous Once  . pantoprazole  40 mg Oral Daily  . vitamin B-12  1,000 mcg Oral Daily   Continuous Infusions: . sodium chloride 75 mL/hr at 06/14/18 0739  . lactated ringers       LOS: 4 days   Time Spent in minutes   30 minutes  Arryana Tolleson D.O. on 06/14/2018 at 9:20 AM  Between 7am to 7pm - Please see pager noted on [email or web address]  After 7pm go to [email or web address]  And look for the night coverage person covering for me after hours  [facility] Office  [phone]

## 2018-06-14 NOTE — Plan of Care (Signed)
  Problem: Pain Managment: Goal: General experience of comfort will improve Outcome: Progressing   Problem: Safety: Goal: Ability to remain free from injury will improve Outcome: Progressing   Problem: Skin Integrity: Goal: Risk for impaired skin integrity will decrease Outcome: Progressing   

## 2018-06-14 NOTE — Progress Notes (Signed)
1 Day Post-Op   Subjective/Chief Complaint: Feels much better than before surgery.    Objective: Vital signs in last 24 hours: Temp:  [97.7 F (36.5 C)-99.2 F (37.3 C)] 98.1 F (36.7 C) (01/12 0545) Pulse Rate:  [59-75] 66 (01/12 0739) Resp:  [12-20] 18 (01/12 0545) BP: (111-151)/(41-74) 151/60 (01/12 0739) SpO2:  [90 %-100 %] 90 % (01/12 0545) Last BM Date: 06/10/18  Intake/Output from previous day: 01/11 0701 - 01/12 0700 In: 1174.1 [P.O.:300; I.V.:668.2; IV Piggyback:205.8] Out: 175 [Urine:100; Blood:75] Intake/Output this shift: No intake/output data recorded.  General appearance: alert and cooperative Resp: clear to auscultation bilaterally Cardio: regular rate and rhythm GI: soft, minimal tenderness  Lab Results:  Recent Labs    06/12/18 0610 06/13/18 0521 06/14/18 0404  WBC 6.2 5.3  --   HGB 9.0* 9.4* 9.5*  HCT 28.7* 31.1* 30.3*  PLT 210 187  --    BMET Recent Labs    06/13/18 0521 06/14/18 0404  NA 141 135  K 5.3* 4.6  CL 105 104  CO2 29 25  GLUCOSE 180* 323*  BUN 12 15  CREATININE 1.48* 1.57*  CALCIUM 8.3* 7.8*   PT/INR No results for input(s): LABPROT, INR in the last 72 hours. ABG No results for input(s): PHART, HCO3 in the last 72 hours.  Invalid input(s): PCO2, PO2  Studies/Results: Nm Hepatobiliary Liver Func  Result Date: 06/12/2018 CLINICAL DATA:  84 year old with cholelithiasis. EXAM: NUCLEAR MEDICINE HEPATOBILIARY IMAGING TECHNIQUE: Sequential images of the abdomen were obtained following intravenous administration of radiopharmaceutical. 2.6 mg of morphine was administered approximately 90 minutes after the radiopharmaceutical injection. Additional 30 minutes of imaging was obtained after the morphine injection. RADIOPHARMACEUTICALS:  5.3 mCi Tc-88m  Choletec IV COMPARISON:  Ultrasound 06/10/2018 FINDINGS: Prompt uptake and biliary excretion of activity in the liver. Activity identified in the small bowel compatible with a patent  common bile duct. However, there is no activity identified in the gallbladder throughout this examination. IMPRESSION: No gallbladder uptake even after the administration of morphine. Findings are suggestive for cystic duct obstruction and concerning for cholecystitis. Electronically Signed   By: Markus Daft M.D.   On: 06/12/2018 11:20   Dg Ercp Biliary & Pancreatic Ducts  Result Date: 06/12/2018 CLINICAL DATA:  84 year old female undergoing ERCP for possible choledocholithiasis EXAM: ERCP TECHNIQUE: Multiple spot images obtained with the fluoroscopic device and submitted for interpretation post-procedure. FLUOROSCOPY TIME:  Fluoroscopy Time:  1 minutes 35 seconds reported COMPARISON:  Nuclear medicine HIDA scan 06/12/2018 FINDINGS: A total of 4 images are submitted for review. The images demonstrate a flexible endoscope in the descending duodenum with wire cannulation of the left intrahepatic ducts. Cholangiogram demonstrates filling defects in the common bile duct consistent with choledocholithiasis. Subsequent images demonstrate sphincterotomy and balloon sweep of the common duct. Contrast is present within the duodenum on the final image. IMPRESSION: ERCP as above. These images were submitted for radiologic interpretation only. Please see the procedural report for the amount of contrast and the fluoroscopy time utilized. Electronically Signed   By: Jacqulynn Cadet M.D.   On: 06/12/2018 14:33    Anti-infectives: Anti-infectives (From admission, onward)   Start     Dose/Rate Route Frequency Ordered Stop   06/11/18 2200  piperacillin-tazobactam (ZOSYN) IVPB 3.375 g  Status:  Discontinued     3.375 g 12.5 mL/hr over 240 Minutes Intravenous Every 8 hours 06/11/18 1603 06/11/18 1621   06/11/18 1800  piperacillin-tazobactam (ZOSYN) IVPB 3.375 g     3.375 g 12.5  mL/hr over 240 Minutes Intravenous Every 8 hours 06/11/18 1621     06/11/18 1645  vancomycin (VANCOCIN) 1,500 mg in sodium chloride 0.9 % 500  mL IVPB  Status:  Discontinued     1,500 mg 250 mL/hr over 120 Minutes Intravenous NOW 06/11/18 1559 06/11/18 1602   06/11/18 0600  piperacillin-tazobactam (ZOSYN) IVPB 3.375 g  Status:  Discontinued     3.375 g 12.5 mL/hr over 240 Minutes Intravenous Every 12 hours 06/10/18 2124 06/11/18 1603   06/10/18 2300  dapsone tablet 25 mg     25 mg Oral Daily 06/10/18 2257     06/10/18 2115  dapsone tablet 25 mg  Status:  Discontinued     25 mg Oral Daily 06/10/18 2112 06/10/18 2257   06/10/18 1930  metroNIDAZOLE (FLAGYL) IVPB 500 mg     500 mg 100 mL/hr over 60 Minutes Intravenous  Once 06/10/18 1924 06/10/18 2153   06/10/18 1800  ceFEPIme (MAXIPIME) 2 g in sodium chloride 0.9 % 100 mL IVPB     2 g 200 mL/hr over 30 Minutes Intravenous  Once 06/10/18 1746 06/10/18 2000      Assessment/Plan: s/p Procedure(s): LAPAROSCOPIC CHOLECYSTECTOMY (N/A) Advance diet  Hg stable Seems to be ok for d/c from surgical standpoint  LOS: 4 days    Autumn Messing III 06/14/2018

## 2018-06-15 ENCOUNTER — Encounter (HOSPITAL_COMMUNITY): Payer: Self-pay | Admitting: Surgery

## 2018-06-15 DIAGNOSIS — E1129 Type 2 diabetes mellitus with other diabetic kidney complication: Secondary | ICD-10-CM

## 2018-06-15 LAB — HEMOGLOBIN AND HEMATOCRIT, BLOOD
HCT: 30.7 % — ABNORMAL LOW (ref 36.0–46.0)
Hemoglobin: 9.3 g/dL — ABNORMAL LOW (ref 12.0–15.0)

## 2018-06-15 LAB — COMPREHENSIVE METABOLIC PANEL
ALT: 61 U/L — AB (ref 0–44)
AST: 68 U/L — ABNORMAL HIGH (ref 15–41)
Albumin: 2.3 g/dL — ABNORMAL LOW (ref 3.5–5.0)
Alkaline Phosphatase: 182 U/L — ABNORMAL HIGH (ref 38–126)
Anion gap: 5 (ref 5–15)
BUN: 15 mg/dL (ref 8–23)
CO2: 26 mmol/L (ref 22–32)
Calcium: 8 mg/dL — ABNORMAL LOW (ref 8.9–10.3)
Chloride: 107 mmol/L (ref 98–111)
Creatinine, Ser: 1.39 mg/dL — ABNORMAL HIGH (ref 0.44–1.00)
GFR calc Af Amer: 41 mL/min — ABNORMAL LOW (ref >60)
GFR calc non Af Amer: 35 mL/min — ABNORMAL LOW (ref >60)
Glucose, Bld: 242 mg/dL — ABNORMAL HIGH (ref 70–99)
Potassium: 4.8 mmol/L (ref 3.5–5.1)
Sodium: 138 mmol/L (ref 135–145)
Total Bilirubin: 1.4 mg/dL — ABNORMAL HIGH (ref 0.3–1.2)
Total Protein: 5 g/dL — ABNORMAL LOW (ref 6.5–8.1)

## 2018-06-15 LAB — CULTURE, BLOOD (ROUTINE X 2)
Culture: NO GROWTH
Special Requests: ADEQUATE

## 2018-06-15 LAB — GLUCOSE, CAPILLARY: Glucose-Capillary: 162 mg/dL — ABNORMAL HIGH (ref 70–99)

## 2018-06-15 MED ORDER — AMOXICILLIN 500 MG PO TABS: 500.0000 mg | ORAL_TABLET | Freq: Two times a day (BID) | ORAL | 0 refills | Status: DC

## 2018-06-15 MED ORDER — OXYCODONE HCL 5 MG PO TABS: 5.0000 mg | ORAL_TABLET | Freq: Four times a day (QID) | ORAL | 0 refills | Status: DC | PRN

## 2018-06-15 MED ORDER — SODIUM CHLORIDE 0.9 % IV SOLN
2.0000 g | Freq: Three times a day (TID) | INTRAVENOUS | Status: DC
  Filled 2018-06-15 (×2): qty 2000

## 2018-06-15 MED FILL — oxyCODONE HCL 5 MG TABS: 5 | 4 days supply | Qty: 15 | Fill #0

## 2018-06-15 MED FILL — AMOXICILLIN 500 MG CAPS: 500 | 3 days supply | Qty: 6 | Fill #0

## 2018-06-15 NOTE — Discharge Instructions (Signed)
Cholecystitis  Cholecystitis is irritation and swelling (inflammation) of the gallbladder. The gallbladder is an organ that is shaped like a pear. It is under the liver on the right side of the body. This organ stores bile. Bile helps the body break down (digest) the fats in food. This condition can occur all of a sudden. It needs to be treated. What are the causes? This condition may be caused by stones or lumps that form in the gallbladder (gallstones). Gallstones can block the tube (duct) that carries bile out of your gallbladder. Other causes are:  Damage to the gallbladder due to less blood flow.  Germs in the bile ducts.  Scars or kinks in the bile ducts.  Abnormal growths (tumors) in the liver, pancreas, or gallbladder. What increases the risk? You are more likely to develop this condition if:  You have sickle cell disease.  You take birth control pills.  You use estrogen.  You have alcoholic liver disease.  You have liver cirrhosis.  You are being fed through a vein.  You are very ill.  You do not eat or drink for a long time. This is also called "fasting."  You are overweight (obese).  You lose weight too fast.  You are pregnant.  You have high levels of fat in the blood (triglycerides).  You have irritation and swelling of the pancreas (pancreatitis). What are the signs or symptoms? Symptoms of this condition include:  Pain in the belly (abdomen). Pain is often in the upper right area of the belly.  Tenderness or bloating in the belly.  Feeling sick to your stomach (nauseous).  Throwing up (vomiting).  Fever.  Chills. How is this diagnosed? This condition may be diagnosed with a medical history and exam. You may also have other tests, such as:  Imaging tests. This may include: ? Ultrasound. ? CT scan of the belly. ? Nuclear scan. This is also called a HIDA scan. This scan lets your doctor see the bile as it moves in your body. ? MRI.  Blood  tests. These are done to check: ? Your blood count. The white blood cell count may be higher than normal. ? How well your liver works. How is this treated? This condition may be treated with:  Surgery to take out your gallbladder.  Antibiotic medicines to treat illnesses caused by germs.  Going without food for some time.  Giving fluids through an IV tube.  Medicines to treat pain or throwing up. Follow these instructions at home:  If you had surgery, follow instructions from your doctor about how to care for yourself after you go home. Medicines   Take over-the-counter and prescription medicines only as told by your doctor.  If you were prescribed an antibiotic medicine, take it as told by your doctor. Do not stop taking it even if you start to feel better. General instructions  Follow instructions from your doctor about what to eat or drink. Do not eat or drink anything that makes you sick again.  Do not lift anything that is heavier than 10 lb (4.5 kg) until your doctor says that it is safe.  Do not use any products that contain nicotine or tobacco, such as cigarettes and e-cigarettes. If you need help quitting, ask your doctor.  Keep all follow-up visits as told by your doctor. This is important. Contact a doctor if:  You have pain and your medicine does not help.  You have a fever. Get help right away if:  Your pain moves to: ? Another part of your belly. ? Your back.  Your symptoms do not go away.  You have new symptoms. Summary  Cholecystitis is swelling and irritation of the gallbladder.  This condition may be caused by stones or lumps that form in the gallbladder (gallstones).  Common symptoms are pain in the belly. You may feel sick to your stomach and start throwing up. You may also have a fever and chills.  This condition may be treated with surgery to take out the gallbladder. It may also be treated with medicines, fasting, and fluids through an IV  tube.  Follow what you are told about eating and drinking. Do not eat things that make you sick again. This information is not intended to replace advice given to you by your health care provider. Make sure you discuss any questions you have with your health care provider. Document Released: 05/09/2011 Document Revised: 09/26/2017 Document Reviewed: 09/26/2017 Elsevier Interactive Patient Education  2019 [employer]  Please arrive at least 30 min before your appointment to complete your check in paperwork.  If you are unable to arrive 30 min prior to your appointment time we may have to cancel or reschedule you. LAPAROSCOPIC SURGERY: POST OP INSTRUCTIONS Always review your discharge instruction sheet given to you by the facility where your surgery was performed. IF YOU HAVE DISABILITY OR FAMILY LEAVE FORMS, YOU MUST BRING THEM TO THE OFFICE FOR PROCESSING.   DO NOT GIVE THEM TO YOUR DOCTOR.  PAIN CONTROL  1. First take acetaminophen (Tylenol) AND/or ibuprofen (Advil) to control your pain after surgery.  Follow directions on package.  Taking acetaminophen (Tylenol) and/or ibuprofen (Advil) regularly after surgery will help to control your pain and lower the amount of prescription pain medication you may need.  You should not take more than 4,000 mg (4 grams) of acetaminophen (Tylenol) in 24 hours.  You should not take ibuprofen (Advil), aleve, motrin, naprosyn or other NSAIDS if you have a history of stomach ulcers or chronic kidney disease.  2. A prescription for pain medication may be given to you upon discharge.  Take your pain medication as prescribed, if you still have uncontrolled pain after taking acetaminophen (Tylenol) or ibuprofen (Advil). 3. Use ice packs to help control pain. 4. If you need a refill on your pain medication, please contact your pharmacy.  They will contact our office to request authorization. Prescriptions will not be filled  after 5pm or on week-ends.  HOME MEDICATIONS 5. Take your usually prescribed medications unless otherwise directed.  DIET 6. You should follow a light diet the first few days after arrival home.  Be sure to include lots of fluids daily. Avoid fatty, fried foods.   CONSTIPATION 7. It is common to experience some constipation after surgery and if you are taking pain medication.  Increasing fluid intake and taking a stool softener (such as Colace) will usually help or prevent this problem from occurring.  A mild laxative (Milk of Magnesia or Miralax) should be taken according to package instructions if there are no bowel movements after 48 hours.  WOUND/INCISION CARE 8. Most patients will experience some swelling and bruising in the area of the incisions.  Ice packs will help.  Swelling and bruising can take several days to resolve.  9. Unless discharge instructions indicate otherwise, follow guidelines below  a. STERI-STRIPS - you may remove your outer bandages 48 hours after surgery, and you  may shower at that time.  You have steri-strips (small skin tapes) in place directly over the incision.  These strips should be left on the skin for 7-10 days.   b. DERMABOND/SKIN GLUE - you may shower in 24 hours.  The glue will flake off over the next 2-3 weeks. 10. Any sutures or staples will be removed at the office during your follow-up visit.  ACTIVITIES 11. You may resume regular (light) daily activities beginning the next day--such as daily self-care, walking, climbing stairs--gradually increasing activities as tolerated.  You may have sexual intercourse when it is comfortable.  Refrain from any heavy lifting or straining until approved by your doctor. a. You may drive when you are no longer taking prescription pain medication, you can comfortably wear a seatbelt, and you can safely maneuver your car and apply brakes.  FOLLOW-UP 12. You should see your doctor in the office for a follow-up  appointment approximately 2-3 weeks after your surgery.  You should have been given your post-op/follow-up appointment when your surgery was scheduled.  If you did not receive a post-op/follow-up appointment, make sure that you call for this appointment within a day or two after you arrive home to insure a convenient appointment time.   WHEN TO CALL YOUR DOCTOR: 1. Fever over 101.0 2. Inability to urinate 3. Continued bleeding from incision. 4. Increased pain, redness, or drainage from the incision. 5. Increasing abdominal pain  The clinic staff is available to answer your questions during regular business hours.  Please dont hesitate to call and ask to speak to one of the nurses for clinical concerns.  If you have a medical emergency, go to the nearest emergency room or call 911.  A surgeon from [facility] is always on call at the hospital. [address] [place], [place], [place]  [address] ? [address], [place], [place]   [address] [phone] ? [phone] ? FAX [phone]

## 2018-06-15 NOTE — Progress Notes (Signed)
Pt is discharged to go home.  Discharge instructions given.  Pt demonstrates understanding of education given.  Prescriptions brought into the room.

## 2018-06-15 NOTE — Discharge Summary (Signed)
Physician Discharge Summary  Morgan Hubbard DDU:[id number] DOB: [birth year] DOA: 06/10/2018  PCP: Abner Greenspan, MD  Admit date: 06/10/2018 Discharge date: 06/15/2018  Time spent: 45 minutes  Recommendations for Outpatient Follow-up:  Patient will be discharged to home.  Patient will need to follow up with primary care provider within one week of discharge, repeat CMP. Follow up with nephrology.  Patient should continue medications as prescribed.  Patient should follow a carb modified diet.   Discharge Diagnoses:  Principal problem: Sepsis abdominal pain secondary to cholecystitis/cholelithiasis/bacteremia Enterococcal bacteremia Elevated LFTs Acute kidney injury on chronic kidney disease, stage III Chronic systolic/diastolic heart failure Diabetes mellitus, type II Hypercholesteremia Essential hypertension GERD  Discharge Condition: Stable  Diet recommendation: cab modified  Filed Weights   06/11/18 2148  Weight: 64.2 kg    History of present illness:  on 06/10/2018 by Dr. Eugenio Hoes Hubbard an 84 y.o.femalewith medical history significant ofhypertension, hyperlipidemia, diabetes mellitus, stroke, GERD, left bundle blockage, migraine headache, AICD placement, vertigo, solitary kidney, CHF with EF of 15%, breast cancer (lumpectomy), who presents with fever, chills, abdominal pain, nausea and vomiting.  Patient states thather symptoms started yesterday, includingfever, chills, abdominal pain, nausea vomiting. Her abdominal pain is located in the upper abdomen, constant, mild, 3 out of 10 severity, sharp, nonradiating. It is associated with nausea and vomiting. She has had nonbilious, nonbloody vomitus at least 5 times. No diarrhea. She had chronic mild shortness of breath due to CHF, which has not changed. No chest pain, cough. She has body aches, but no runny nose or sore throat. Denies symptoms of UTI or unilateral weakness. Of note, patient states that she cannot  take Tylenol or NSAIDs. She takes aspirin for fever in the past.  Hospital Course:  Sepsis abdominal pain secondary to cholecystitis/cholelithiasis/bacteremia -Patient admitted with fever, tachypnea and elevated lactic acid of 2.93 -Currently hemodynamically stable -HIDA scan positive -Gastroenterology consulted and appreciated, status post ERCP -General surgery consulted and appreciated, status post laparoscopic cholecystectomy today -Continue Zosyn, patient did receive 1 dose of cefepime and Flagyl in the emergency department -Blood cultures positive for enterococcus faecium -Infectious disease consulted and appreciated, recommending continuation of Zosyn with echocardiogram and repeat cultures -Discussed with Dr. Megan Hubbard (ID), no need for TEE, recommended Amoxicillin on discharge -Repeat cultures on 06/12/2018 show no growth to date -Echocardiogram negative for vegetation -transitioned to amoxicillin -Follow up with general sugery  Enterococcal bacteremia -Treatment plan as abovecho  Elevated LFTs -Secondary to the above -LFTs trending downward, currently AST 68, ALT 61 -repeat CMP in one week  Acute kidney injury on chronic kidney disease, stage III -Baseline creatinine 0.97 (in 2018), patient presented with creatinine 1.24 which peaked to 1.57, down to 1.3 -patient with solitary kidney -GFR still within stage III however worse than baseline -Placed on patient on IVF, discontinue zosyn -patient did receive contrast, vancomycin, indocin -Renal ultrasound showed tiny right renal parenchymal cyst.  No hydronephrosis.  Surgical absence of left kidney. -Repeat CMP in 1 week -Patient should follow-up with nephrology  Chronic systolic/diastolic heart failure -Echocardiogram showed an EF of 40 to 45%.  Diffuse hypokinesis, grade 2 diastolic dysfunction.  No evidence of vegetation.  Marked improvement in LV function since 2015. -appears to be euvolemic and  compensated -Continue to monitor intake and output, daily weights  Diabetes mellitus, type II -Hemoglobin A1c 7.7 on 05/02/2018 -Currently on insulin sliding scale with CBG monitoring -Metformin and Amaryl held- may resume on discharge  Hypercholesteremia -Lipitor was held due  to abnormal liver function -continue to hold until LFTs have normalized  Essential hypertension -hold HCTZ, may resume losartan on discharge with close renal monitoring   GERD -Continue PPI  Allergies -discussed seeing and Allergist on discharge -Continue H2 blockers  Consultants Gastroenterology General surgery Infectious disease  Procedures  Echocardiogram HIDA scan ERCP Laparoscopic cholecystectomy  Discharge Exam: Vitals:   06/15/18 0401 06/15/18 0524  BP: (!) 181/73 (!) 158/67  Pulse: 64 60  Resp: 20   Temp: 97.9 F (36.6 C)   SpO2: 91%    Patient has no complaints today.  States she is feeling better.  Does complain about some swelling and middle of her upper lip.  Denies current chest pain, shortness breath, abdominal pain, nausea or vomiting, diarrhea or constipation, dizziness or headache, problems breathing or eating.   General: Well developed, well nourished, NAD, appears stated age  84: NCAT, mucous membranes moist.  Neck: Supple  Cardiovascular: S1 S2 auscultated, +SEM, RRR  Respiratory: Clear to auscultation bilaterally with equal chest rise  Abdomen: Soft, nontender, nondistended, + bowel sounds  Extremities: warm dry without cyanosis clubbing or edema  Neuro: AAOx3, nonfocal  Psych: Normal affect and demeanor   Discharge Instructions Discharge Instructions    Discharge instructions   Complete by:  As directed    Patient will be discharged to home.  Patient will need to follow up with primary care provider within one week of discharge, repeat CMP. Follow up with nephrology.  Patient should continue medications as prescribed.  Patient should follow a carb  modified diet.   Hold cholesterol medication until your liver function has completely normalized.     Allergies as of 06/15/2018      Reactions   Ace Inhibitors Swelling   REACTION: tongue swelling   Codeine Other (See Comments)   REACTION: nausea   Hydrochlorothiazide Other (See Comments)   unknown   Insulin Detemir Itching   Lisinopril Swelling   Swelling of tongue   Nsaids Other (See Comments)   Unable to take D/T kidney issues   Tylenol [acetaminophen] Other (See Comments)   Pt has one kidney and says she can't take it      Medication List    STOP taking these medications   hydrochlorothiazide 12.5 MG tablet Commonly known as:  HYDRODIURIL     TAKE these medications   albuterol 108 (90 Base) MCG/ACT inhaler Commonly known as:  PROVENTIL HFA;VENTOLIN HFA Inhale 2 puffs into the lungs every 4 (four) hours as needed for wheezing.   AMARYL 1 MG tablet Generic drug:  glimepiride Take 2 mg by mouth daily.   amoxicillin 500 MG tablet Commonly known as:  AMOXIL Take 1 tablet (500 mg total) by mouth 2 (two) times daily.   aspirin 325 MG tablet Take 325 mg by mouth daily.   atorvastatin 80 MG tablet Commonly known as:  LIPITOR Take 80 mg by mouth daily.   benzonatate 200 MG capsule Commonly known as:  TESSALON Take 1 capsule (200 mg total) by mouth 3 (three) times daily as needed. Do not bite pill   carvedilol 12.5 MG tablet Commonly known as:  COREG Take 1 tablet (12.5 mg total) by mouth 2 (two) times daily with a meal.   cetirizine 10 MG tablet Commonly known as:  ZYRTEC Take 10 mg by mouth 2 (two) times daily.   clotrimazole-betamethasone cream Commonly known as:  LOTRISONE Apply 1 application topically as needed (groin).   dapsone 25 MG tablet Take 25 mg by  mouth daily.   gabapentin 300 MG capsule Commonly known as:  NEURONTIN Take 1 capsule (300 mg total) by mouth 3 (three) times daily.   hydrALAZINE 50 MG tablet Commonly known as:   APRESOLINE Take 1 tablet by mouth 2 (two) times daily.   isosorbide mononitrate 30 MG 24 hr tablet Commonly known as:  IMDUR Take 1 tablet (30 mg total) by mouth daily.   letrozole 2.5 MG tablet Commonly known as:  FEMARA Take 2.5 mg by mouth daily.   losartan 50 MG tablet Commonly known as:  COZAAR Take 50 mg by mouth daily.   metFORMIN 500 MG 24 hr tablet Commonly known as:  GLUCOPHAGE-XR Take 1,000 mg by mouth 2 (two) times daily.   MILK THISTLE PO Take 1 capsule by mouth 2 (two) times daily.   [name] 50 MG Tb24 tablet Generic drug:  mirabegron ER Take 50 mg by mouth daily.   nitroGLYCERIN 0.4 MG SL tablet Commonly known as:  NITROSTAT Place 0.4 mg under the tongue every 5 (five) minutes as needed for chest pain.   ONE TOUCH ULTRA TEST test strip Generic drug:  glucose blood Use 3 (three) times daily.   oxyCODONE 5 MG immediate release tablet Commonly known as:  Oxy IR/ROXICODONE Take 1-2 tablets (5-10 mg total) by mouth every 6 (six) hours as needed for moderate pain, severe pain or breakthrough pain.   pantoprazole 40 MG tablet Commonly known as:  PROTONIX Take 1 tablet (40 mg total) by mouth daily.   promethazine-dextromethorphan 6.25-15 MG/5ML syrup Commonly known as:  PROMETHAZINE-DM TAKE 5 ML BY MOUTH 4 TIMES DAILY AS NEEDED FOR COUGH (CAUTION  SEDATION) What changed:  See the new instructions.   traMADol 50 MG tablet Commonly known as:  ULTRAM Take 1-2 tablets (50-100 mg total) by mouth every 8 (eight) hours as needed for moderate pain or severe pain.   vitamin B-12 1000 MCG tablet Commonly known as:  CYANOCOBALAMIN Take 1,000 mcg by mouth daily.   Vitamin D-3 25 MCG (1000 UT) Caps Take 1 capsule by mouth daily.      Allergies  Allergen Reactions  . Ace Inhibitors Swelling    REACTION: tongue swelling  . Codeine Other (See Comments)    REACTION: nausea  . Hydrochlorothiazide Other (See Comments)    unknown  . Insulin Detemir Itching  .  Lisinopril Swelling    Swelling of tongue  . Nsaids Other (See Comments)    Unable to take D/T kidney issues  . Tylenol [Acetaminophen] Other (See Comments)    Pt has one kidney and says she can't take it   Follow-up Information    Tower, [name], MD Follow up in 1 week(s).   Specialties:  Family Medicine, Radiology Why:  Hospital follow up Contact information: [place] [address] [phone]        [place], [place]. Schedule an appointment as soon as possible for a visit.   Why:  Establish care- kindeys Contact information: [place] [address] [phone]            The results of significant diagnostics from this hospitalization (including imaging, microbiology, ancillary and laboratory) are listed below for reference.    Significant Diagnostic Studies: Dg Chest 2 View  Result Date: 06/10/2018 CLINICAL DATA:  Chills, headache, and body aches all day. Diagnosed with the flu. EXAM: CHEST - 2 VIEW COMPARISON:  05/13/2018. FINDINGS: Stable hyperinflation. LEFT AICD unchanged. Cardiomegaly. Thoracic atherosclerosis. Clear lung fields. Straightening  of the normal thoracic kyphosis. No worrisome osseous lesion. Stable appearance from priors. IMPRESSION: No active cardiopulmonary disease. Electronically Signed   By: Staci Righter M.D.   On: 06/10/2018 18:24   Nm Hepatobiliary Liver Func  Result Date: 06/12/2018 CLINICAL DATA:  84 year old with cholelithiasis. EXAM: NUCLEAR MEDICINE HEPATOBILIARY IMAGING TECHNIQUE: Sequential images of the abdomen were obtained following intravenous administration of radiopharmaceutical. 2.6 mg of morphine was administered approximately 90 minutes after the radiopharmaceutical injection. Additional 30 minutes of imaging was obtained after the morphine injection. RADIOPHARMACEUTICALS:  5.3 mCi Tc-41m  Choletec IV COMPARISON:  Ultrasound 06/10/2018 FINDINGS: Prompt uptake and biliary excretion of activity in the  liver. Activity identified in the small bowel compatible with a patent common bile duct. However, there is no activity identified in the gallbladder throughout this examination. IMPRESSION: No gallbladder uptake even after the administration of morphine. Findings are suggestive for cystic duct obstruction and concerning for cholecystitis. Electronically Signed   By: Markus Daft M.D.   On: 06/12/2018 11:20   US Renal  Result Date: 06/14/2018 CLINICAL DATA:  Acute kidney injury. Left kidney donation. Chronic kidney disease, stage III. Hypertension. EXAM: RENAL / URINARY TRACT ULTRASOUND COMPLETE COMPARISON:  CT abdomen and pelvis 08/27/2011 FINDINGS: Right Kidney: Renal measurements: 10.2 x 5.1 x 5.3 cm = volume: 145 mL. Normal parenchymal echotexture and thickness. Several tiny cysts are demonstrated, measuring up to about 7 mm maximal diameter. No solid mass or hydronephrosis. Left Kidney: Left kidney is surgically absent. Bladder: Appears normal for degree of bladder distention. IMPRESSION: Tiny right renal parenchymal cysts. No hydronephrosis. Surgical absence of left kidney. Electronically Signed   By: Lucienne Capers M.D.   On: 06/14/2018 21:02   US Abdomen Limited  Result Date: 06/10/2018 CLINICAL DATA:  Fever, abnormal liver function tests and heartburn. Known gallstones. EXAM: ULTRASOUND ABDOMEN LIMITED RIGHT UPPER QUADRANT COMPARISON:  None. FINDINGS: Gallbladder: Mild single wall thickness of the gallbladder is suggested to 5 mm, normal at 3 mm or less. The gallbladder is physiologically distended without pathologic distention however. There also appear to be echogenic shadowing gallstones up to 1.3 cm near the neck of the gallbladder admixed with sludge. Sonographer indicates no sonographic Murphy's sign. Common bile duct: Diameter: 10 mm without choledocholithiasis. Liver: No focal lesion identified. Within normal limits in parenchymal echogenicity. Portal vein is patent on color Doppler imaging  with normal direction of blood flow towards the liver. IMPRESSION: 1. Slightly thickened appearance of the gallbladder without significant distention nor pericholecystic fluid. Intraluminal biliary sludge and gallstones are identified. Findings may be secondary to a chronic cholecystitis. HIDA scan may help for further assessment. 2. Dilated common bowel duct to 10 mm without choledocholithiasis. There is a slightly above expected for age. Recently passed stone may contribute to this appearance. Electronically Signed   By: Ashley Royalty M.D.   On: 06/10/2018 19:40   Dg Ercp Biliary & Pancreatic Ducts  Result Date: 06/12/2018 CLINICAL DATA:  84 year old female undergoing ERCP for possible choledocholithiasis EXAM: ERCP TECHNIQUE: Multiple spot images obtained with the fluoroscopic device and submitted for interpretation post-procedure. FLUOROSCOPY TIME:  Fluoroscopy Time:  1 minutes 35 seconds reported COMPARISON:  Nuclear medicine HIDA scan 06/12/2018 FINDINGS: A total of 4 images are submitted for review. The images demonstrate a flexible endoscope in the descending duodenum with wire cannulation of the left intrahepatic ducts. Cholangiogram demonstrates filling defects in the common bile duct consistent with choledocholithiasis. Subsequent images demonstrate sphincterotomy and balloon sweep of the common duct. Contrast is  present within the duodenum on the final image. IMPRESSION: ERCP as above. These images were submitted for radiologic interpretation only. Please see the procedural report for the amount of contrast and the fluoroscopy time utilized. Electronically Signed   By: Jacqulynn Cadet M.D.   On: 06/12/2018 14:33    Microbiology: Recent Results (from the past 240 hour(s))  Culture, blood (Routine x 2)     Status: None   Collection Time: 06/10/18  5:12 PM  Result Value Ref Range Status   Specimen Description SITE NOT SPECIFIED  Final   Special Requests   Final    BOTTLES DRAWN AEROBIC AND  ANAEROBIC Blood Culture adequate volume   Culture   Final    NO GROWTH 5 DAYS Performed at [facility], [address] [place], [place] [address]    Report Status 06/15/2018 FINAL  Final  Culture, blood (Routine x 2)     Status: Abnormal   Collection Time: 06/10/18  5:38 PM  Result Value Ref Range Status   Specimen Description BLOOD LEFT ANTECUBITAL  Final   Special Requests   Final    BOTTLES DRAWN AEROBIC AND ANAEROBIC Blood Culture results may not be optimal due to an excessive volume of blood received in culture bottles   Culture  Setup Time   Final    GRAM POSITIVE COCCI AEROBIC BOTTLE ONLY CRITICAL RESULT CALLED TO, READ BACK BY AND VERIFIED WITH: Hughie Closs PharmD 15:40 06/11/18 ([email or web address]) Performed at [facility], [place] [address], [place], [place] [address]    Culture ENTEROCOCCUS FAECIUM (A)  Final   Report Status 06/13/2018 FINAL  Final   Organism ID, Bacteria ENTEROCOCCUS FAECIUM  Final      Susceptibility   Enterococcus faecium - MIC*    AMPICILLIN <=2 SENSITIVE Sensitive     VANCOMYCIN <=0.5 SENSITIVE Sensitive     GENTAMICIN SYNERGY SENSITIVE Sensitive     * ENTEROCOCCUS FAECIUM  Blood Culture ID Panel (Reflexed)     Status: Abnormal   Collection Time: 06/10/18  5:38 PM  Result Value Ref Range Status   Enterococcus species DETECTED (A) NOT DETECTED Final    Comment: CRITICAL RESULT CALLED TO, READ BACK BY AND VERIFIED WITH: Hughie Closs PharmD 15:40 06/11/18 ([email or web address])    Vancomycin resistance NOT DETECTED NOT DETECTED Final   Listeria monocytogenes NOT DETECTED NOT DETECTED Final   Staphylococcus species NOT DETECTED NOT DETECTED Final   Staphylococcus aureus (BCID) NOT DETECTED NOT DETECTED Final   Streptococcus species NOT DETECTED NOT DETECTED Final   Streptococcus agalactiae NOT DETECTED NOT DETECTED Final   Streptococcus pneumoniae NOT DETECTED NOT DETECTED Final   Streptococcus pyogenes NOT DETECTED NOT DETECTED Final   Acinetobacter baumannii NOT  DETECTED NOT DETECTED Final   Enterobacteriaceae species NOT DETECTED NOT DETECTED Final   Enterobacter cloacae complex NOT DETECTED NOT DETECTED Final   Escherichia coli NOT DETECTED NOT DETECTED Final   Klebsiella oxytoca NOT DETECTED NOT DETECTED Final   Klebsiella pneumoniae NOT DETECTED NOT DETECTED Final   Proteus species NOT DETECTED NOT DETECTED Final   Serratia marcescens NOT DETECTED NOT DETECTED Final   Haemophilus influenzae NOT DETECTED NOT DETECTED Final   Neisseria meningitidis NOT DETECTED NOT DETECTED Final   Pseudomonas aeruginosa NOT DETECTED NOT DETECTED Final   Candida albicans NOT DETECTED NOT DETECTED Final   Candida glabrata NOT DETECTED NOT DETECTED Final   Candida krusei NOT DETECTED NOT DETECTED Final   Candida parapsilosis NOT DETECTED NOT DETECTED Final  Candida tropicalis NOT DETECTED NOT DETECTED Final    Comment: Performed at [facility], [place] [address], [place], [place] [address]  Culture, blood (routine x 2)     Status: None (Preliminary result)   Collection Time: 06/12/18  6:10 AM  Result Value Ref Range Status   Specimen Description BLOOD RIGHT HAND  Final   Special Requests   Final    BOTTLES DRAWN AEROBIC AND ANAEROBIC Blood Culture results may not be optimal due to an excessive volume of blood received in culture bottles   Culture   Final    NO GROWTH 3 DAYS Performed at [facility], [place] [address] [place], [place] [address]    Report Status PENDING  Incomplete  Culture, blood (routine x 2)     Status: None (Preliminary result)   Collection Time: 06/12/18  6:20 AM  Result Value Ref Range Status   Specimen Description BLOOD LEFT HAND  Final   Special Requests   Final    BOTTLES DRAWN AEROBIC AND ANAEROBIC Blood Culture results may not be optimal due to an excessive volume of blood received in culture bottles   Culture   Final    NO GROWTH 3 DAYS Performed at [facility], [place] [address] [place] [address], [place], [place] [address]     Report Status PENDING  Incomplete  Surgical pcr screen     Status: None   Collection Time: 06/12/18  8:44 PM  Result Value Ref Range Status   MRSA, PCR NEGATIVE NEGATIVE Final   Staphylococcus aureus NEGATIVE NEGATIVE Final    Comment: (NOTE) The Xpert SA Assay (FDA approved for NASAL specimens in patients 33 years of age and older), is one component of a comprehensive surveillance program. It is not intended to diagnose infection nor to guide or monitor treatment. Performed at [facility], [place] [address] [place], [place] [address]      Labs: Basic Metabolic Panel: Recent Labs  Lab 06/11/18 0221 06/12/18 0610 06/13/18 0521 06/14/18 0404 06/15/18 0426  NA 141 137 141 135 138  K 4.2 3.5 5.3* 4.6 4.8  CL 102 103 105 104 107  CO2 29 26 29 25 26   GLUCOSE 198* 65* 180* 323* 242*  BUN 15 11 12 15 15   CREATININE 1.48* 1.16* 1.48* 1.57* 1.39*  CALCIUM 8.3* 7.6* 8.3* 7.8* 8.0*   Liver Function Tests: Recent Labs  Lab 06/11/18 0221 06/12/18 0610 06/13/18 0521 06/14/18 0404 06/15/18 0426  AST 264* 107* 58* 90* 68*  ALT 128* 81* 60* 66* 61*  ALKPHOS 256* 211* 221* 192* 182*  BILITOT 4.3* 3.7* 2.2* 1.2 1.4*  PROT 5.5* 5.2* 5.0* 5.0* 5.0*  ALBUMIN 2.7* 2.3* 2.2* 2.1* 2.3*   Recent Labs  Lab 06/11/18 0221  LIPASE 21   No results for input(s): AMMONIA in the last 168 hours. CBC: Recent Labs  Lab 06/09/18 1218 06/10/18 1713 06/11/18 0221 06/12/18 0610 06/13/18 0521 06/14/18 0404 06/15/18 0426  WBC 4.9 6.1 12.3* 6.2 5.3  --   --   NEUTROABS 2.4 4.6  --   --   --   --   --   HGB 11.4* 11.0* 9.7* 9.0* 9.4* 9.5* 9.3*  HCT 34.9* 35.8* 31.7* 28.7* 31.1* 30.3* 30.7*  MCV 96.9 98.6 100.0 99.3 102.3*  --   --   PLT 281.0 259 231 210 187  --   --    Cardiac Enzymes: No results for input(s): CKTOTAL, CKMB, CKMBINDEX, TROPONINI in the last  168 hours. BNP: BNP (last 3 results) Recent Labs    06/10/18 2215  BNP 220.2*    ProBNP (last 3 results) No results  for input(s): PROBNP in the last 8760 hours.  CBG: Recent Labs  Lab 06/14/18 0818 06/14/18 1156 06/14/18 1721 06/14/18 2221 06/15/18 0817  GLUCAP 289* 282* 263* 244* 162*       Signed:  Devita Nies  [facility] 06/15/2018, 9:02 AM

## 2018-06-15 NOTE — Final Consult Note (Signed)
Consultant Final Sign-Off Note    Assessment/Final recommendations  Morgan Hubbard is a 84 y.o. female followed by me for cholecystitis in the setting of choledocholithiasis status post ERCP.  Patient initially presented on 1/9 for acute onset of fever, chills, nausea and vomiting.  Ultrasound in the ED showed a thickened appearance of the gallbladder without significant distention or pericholecystic fluid, intra-luminal biliary sludge and gallstones, dilated common bile duct to 10 mm. Labs were significant for elevated lactic acid, elevated LFTs and bilirubin.  Patient denied any abdominal pain so a HIDA scan was ordered to evaluate for acute cholecystitis.  HIDA scan was positive with no uptake in the gallbladder.  During admission the patient underwent an ERCP with stone extraction and sphincterotomy by Dr. Benson Norway of GI on 1/10.  During admission blood cultures were positive for enterococcus faecium.  ID was consulted and patient was started on IV Zosyn.  The recommended discharge with Amoxicillin per hospitalists note.  Patient underwent laparoscopic cholecystectomy by Dr. Georgette Dover on 1/11.  On POD 1, the patient was tolerating a regular diet, voiding well, mobilizing, and pain was controlled with oral pain medications. On POD 2, the patient was stable from a surgical aspect. Surgery will sign off at this time. Appropriate follow up made.  Wound care (if applicable):    Diet at discharge: per primary team   Activity at discharge: Patient should not lift >15lbs for the first 2 weeks. She should not lift >40 lbs for this first 4 weeks.    Follow-up appointment:  2-3 weeks   Pending results:  Unresulted Labs (From admission, onward)    Start     Ordered   06/14/18 0912  Creatinine, urine, 24 hour  Once,   R     06/14/18 0912           Medication recommendations: Abx per ID. Pain medication written by Dr. Marlou Starks yesterday.    Other recommendations:     Thank you for allowing [place] to  participate in the care of your patient!  Please consult [place] again if you have further needs for your patient.  Barth Kirks [facility] 06/15/2018 8:14 AM    Subjective  Patient doing well today. Reports some pain overnight, relieved with oral medication. Voiding well. Mobilizing. Using IS. + Flatus. No N/V.    Objective  Vital signs in last 24 hours: Temp:  [97.8 F (36.6 C)-98 F (36.7 C)] 97.9 F (36.6 C) (01/13 0401) Pulse Rate:  [59-66] 60 (01/13 0524) Resp:  [18-20] 20 (01/13 0401) BP: (154-181)/(58-73) 158/67 (01/13 0524) SpO2:  [91 %-94 %] 91 % (01/13 0401)  General: Abd: Soft, non-distended. Appropriately tender. + BS. Tegaderm removed. Incisions with steri strips overlying & c/d/i.    Pertinent labs and Studies: Recent Labs    06/13/18 0521 06/14/18 0404 06/15/18 0426  WBC 5.3  --   --   HGB 9.4* 9.5* 9.3*  HCT 31.1* 30.3* 30.7*   BMET Recent Labs    06/14/18 0404 06/15/18 0426  NA 135 138  K 4.6 4.8  CL 104 107  CO2 25 26  GLUCOSE 323* 242*  BUN 15 15  CREATININE 1.57* 1.39*  CALCIUM 7.8* 8.0*   No results for input(s): LABURIN in the last 72 hours. Results for orders placed or performed during the hospital encounter of 06/10/18  Culture, blood (Routine x 2)     Status: None   Collection Time: 06/10/18  5:12 PM  Result Value Ref Range Status  Specimen Description SITE NOT SPECIFIED  Final   Special Requests   Final    BOTTLES DRAWN AEROBIC AND ANAEROBIC Blood Culture adequate volume   Culture   Final    NO GROWTH 5 DAYS Performed at [facility], [address] [place], [place] [address]    Report Status 06/15/2018 FINAL  Final  Culture, blood (Routine x 2)     Status: Abnormal   Collection Time: 06/10/18  5:38 PM  Result Value Ref Range Status   Specimen Description BLOOD LEFT ANTECUBITAL  Final   Special Requests   Final    BOTTLES DRAWN AEROBIC AND ANAEROBIC Blood Culture results may not be optimal due to an excessive volume of blood  received in culture bottles   Culture  Setup Time   Final    GRAM POSITIVE COCCI AEROBIC BOTTLE ONLY CRITICAL RESULT CALLED TO, READ BACK BY AND VERIFIED WITH: Hughie Closs PharmD 15:40 06/11/18 ([email or web address]) Performed at [facility], [place] [address], [place], [place] [address]    Culture ENTEROCOCCUS FAECIUM (A)  Final   Report Status 06/13/2018 FINAL  Final   Organism ID, Bacteria ENTEROCOCCUS FAECIUM  Final      Susceptibility   Enterococcus faecium - MIC*    AMPICILLIN <=2 SENSITIVE Sensitive     VANCOMYCIN <=0.5 SENSITIVE Sensitive     GENTAMICIN SYNERGY SENSITIVE Sensitive     * ENTEROCOCCUS FAECIUM  Blood Culture ID Panel (Reflexed)     Status: Abnormal   Collection Time: 06/10/18  5:38 PM  Result Value Ref Range Status   Enterococcus species DETECTED (A) NOT DETECTED Final    Comment: CRITICAL RESULT CALLED TO, READ BACK BY AND VERIFIED WITH: Hughie Closs PharmD 15:40 06/11/18 ([email or web address])    Vancomycin resistance NOT DETECTED NOT DETECTED Final   Listeria monocytogenes NOT DETECTED NOT DETECTED Final   Staphylococcus species NOT DETECTED NOT DETECTED Final   Staphylococcus aureus (BCID) NOT DETECTED NOT DETECTED Final   Streptococcus species NOT DETECTED NOT DETECTED Final   Streptococcus agalactiae NOT DETECTED NOT DETECTED Final   Streptococcus pneumoniae NOT DETECTED NOT DETECTED Final   Streptococcus pyogenes NOT DETECTED NOT DETECTED Final   Acinetobacter baumannii NOT DETECTED NOT DETECTED Final   Enterobacteriaceae species NOT DETECTED NOT DETECTED Final   Enterobacter cloacae complex NOT DETECTED NOT DETECTED Final   Escherichia coli NOT DETECTED NOT DETECTED Final   Klebsiella oxytoca NOT DETECTED NOT DETECTED Final   Klebsiella pneumoniae NOT DETECTED NOT DETECTED Final   Proteus species NOT DETECTED NOT DETECTED Final   Serratia marcescens NOT DETECTED NOT DETECTED Final   Haemophilus influenzae NOT DETECTED NOT DETECTED Final   Neisseria meningitidis NOT DETECTED NOT  DETECTED Final   Pseudomonas aeruginosa NOT DETECTED NOT DETECTED Final   Candida albicans NOT DETECTED NOT DETECTED Final   Candida glabrata NOT DETECTED NOT DETECTED Final   Candida krusei NOT DETECTED NOT DETECTED Final   Candida parapsilosis NOT DETECTED NOT DETECTED Final   Candida tropicalis NOT DETECTED NOT DETECTED Final    Comment: Performed at [facility], [address] [place], [place] [address]  Culture, blood (routine x 2)     Status: None (Preliminary result)   Collection Time: 06/12/18  6:10 AM  Result Value Ref Range Status   Specimen Description BLOOD RIGHT HAND  Final   Special Requests   Final    BOTTLES DRAWN AEROBIC AND ANAEROBIC Blood Culture results may not be optimal due to an excessive  volume of blood received in culture bottles   Culture   Final    NO GROWTH 3 DAYS Performed at [facility], [place] [address] [place], [place] [address]    Report Status PENDING  Incomplete  Culture, blood (routine x 2)     Status: None (Preliminary result)   Collection Time: 06/12/18  6:20 AM  Result Value Ref Range Status   Specimen Description BLOOD LEFT HAND  Final   Special Requests   Final    BOTTLES DRAWN AEROBIC AND ANAEROBIC Blood Culture results may not be optimal due to an excessive volume of blood received in culture bottles   Culture   Final    NO GROWTH 3 DAYS Performed at [facility], [place] [address], [place], [place] [address]    Report Status PENDING  Incomplete  Surgical pcr screen     Status: None   Collection Time: 06/12/18  8:44 PM  Result Value Ref Range Status   MRSA, PCR NEGATIVE NEGATIVE Final   Staphylococcus aureus NEGATIVE NEGATIVE Final    Comment: (NOTE) The Xpert SA Assay (FDA approved for NASAL specimens in patients 68 years of age and older), is one component of a comprehensive surveillance program. It is not intended to diagnose infection nor to guide or monitor treatment. Performed at [facility], [place]  [address], [place], [place] [address]     Imaging: US Renal  Result Date: 06/14/2018 CLINICAL DATA:  Acute kidney injury. Left kidney donation. Chronic kidney disease, stage III. Hypertension. EXAM: RENAL / URINARY TRACT ULTRASOUND COMPLETE COMPARISON:  CT abdomen and pelvis 08/27/2011 FINDINGS: Right Kidney: Renal measurements: 10.2 x 5.1 x 5.3 cm = volume: 145 mL. Normal parenchymal echotexture and thickness. Several tiny cysts are demonstrated, measuring up to about 7 mm maximal diameter. No solid mass or hydronephrosis. Left Kidney: Left kidney is surgically absent. Bladder: Appears normal for degree of bladder distention. IMPRESSION: Tiny right renal parenchymal cysts. No hydronephrosis. Surgical absence of left kidney. Electronically Signed   By: Lucienne Capers M.D.   On: 06/14/2018 21:02

## 2018-06-16 ENCOUNTER — Telehealth: Payer: Self-pay | Admitting: Family Medicine

## 2018-06-16 ENCOUNTER — Telehealth: Payer: Self-pay | Admitting: *Deleted

## 2018-06-16 DIAGNOSIS — I10 Essential (primary) hypertension: Secondary | ICD-10-CM | POA: Diagnosis not present

## 2018-06-16 DIAGNOSIS — N183 Chronic kidney disease, stage 3 (moderate): Principal | ICD-10-CM

## 2018-06-16 DIAGNOSIS — R809 Proteinuria, unspecified: Secondary | ICD-10-CM | POA: Diagnosis not present

## 2018-06-16 DIAGNOSIS — E1122 Type 2 diabetes mellitus with diabetic chronic kidney disease: Secondary | ICD-10-CM | POA: Diagnosis not present

## 2018-06-16 DIAGNOSIS — Z9289 Personal history of other medical treatment: Secondary | ICD-10-CM | POA: Diagnosis not present

## 2018-06-16 DIAGNOSIS — N179 Acute kidney failure, unspecified: Secondary | ICD-10-CM

## 2018-06-16 DIAGNOSIS — E1129 Type 2 diabetes mellitus with other diabetic kidney complication: Secondary | ICD-10-CM | POA: Diagnosis not present

## 2018-06-16 NOTE — Telephone Encounter (Signed)
Transition Care Management Follow-up Telephone Call  Date discharged? 06/15/2018  How have you been since you were released from the hospital? "Sleeping a lot and weak, using walker to get around in the house"   Do you understand why you were in the hospital YES   Do you understand the discharge instructions? YES   Where were you discharged to? HOME   Items Reviewed: Medications reviewed: YES  Allergies reviewed: YES   Dietary changes reviewed: YES  Referrals reviewed: YES  Functional Questionnaire:   Activities of Daily Living (ADLs):   She states they are independent in the following: grooming, going to the bathroom, dressing, ambulation, bathing, and feeding States they require assistance with the following: Nothing, using walker   Any transportation issues/concerns?: NO  Any patient concerns? NO   Confirmed importance and date/time of follow-up visits scheduled YES  Provider Appointment booked with Dr. [name] 06/22/18 at 9:30  Confirmed with patient if condition begins to worsen call PCP or go to the ER.  Patient was given the office number and encouraged to call back with question or concerns.  : YES

## 2018-06-16 NOTE — Telephone Encounter (Signed)
Pt need a referral to [facility]. Please advise

## 2018-06-16 NOTE — Telephone Encounter (Signed)
Referral done Will route to [facility]  

## 2018-06-17 LAB — CULTURE, BLOOD (ROUTINE X 2)
Culture: NO GROWTH
Culture: NO GROWTH

## 2018-06-17 NOTE — Telephone Encounter (Signed)
Spoke with patient and gave her the Referral details of where we will be sending her Referral, she is aware.

## 2018-06-18 NOTE — Anesthesia Postprocedure Evaluation (Signed)
Anesthesia Post Note  Patient: Morgan Hubbard  Procedure(s) Performed: LAPAROSCOPIC CHOLECYSTECTOMY (N/A Abdomen)     Patient location during evaluation: PACU Anesthesia Type: General Level of consciousness: awake and alert Pain management: pain level controlled Vital Signs Assessment: post-procedure vital signs reviewed and stable Respiratory status: spontaneous breathing, nonlabored ventilation, respiratory function stable and patient connected to nasal cannula oxygen Cardiovascular status: blood pressure returned to baseline and stable Postop Assessment: no apparent nausea or vomiting Anesthetic complications: no    Last Vitals:  Vitals:   06/15/18 0401 06/15/18 0524  BP: (!) 181/73 (!) 158/67  Pulse: 64 60  Resp: 20   Temp: 36.6 C   SpO2: 91%     Last Pain:  Vitals:   06/15/18 0830  TempSrc:   PainSc: 0-No pain                 Sufyan Meidinger

## 2018-06-21 NOTE — ED Provider Notes (Signed)
[facility]  SURGICAL Provider Note   CSN: [id number] Arrival date & time: 06/10/18  1659     History   Chief Complaint Chief Complaint  Patient presents with  . Chills  . Generalized Body Aches  . Headache  . Nausea    HPI Morgan Hubbard is a 84 y.o. female.  HPI  83yF with general malaise. Chills. N/v.  Symptoms started yesterday, including fever, chills, abdominal pain, nausea vomiting.  Her abdominal pain is located in the upper abdomen, constant, mild, 3 out of 10 severity, sharp, nonradiating.  It is associated with nausea and vomiting.  She has had nonbilious, nonbloody vomitus at least 5 times.  No diarrhea. She had chronic mild shortness of breath due to CHF, which has not changed.  No chest pain, cough.   Past Medical History:  Diagnosis Date  . Arthritis    "hands" (01/13/2014)  . Basal cell carcinoma 01/2014   "bridge of nose"  . Cardiac LV ejection fraction >40%    "it was 43 last year" (01/13/2014)  . Chronic lower back pain   . CKD (chronic kidney disease), stage III (HCC)    acute on chronic stage III/notes 06/10/2018  . Colon polyps   . Expressive aphasia    "3 times in the last week" (01/13/2014)  . GERD (gastroesophageal reflux disease)   . Goiter past remote   treated with RI  . Hyperlipidemia   . Hyperpotassemia   . Hypertension   . Hypertonicity of bladder   . Inflammatory and toxic neuropathy, unspecified   . LBBB (left bundle branch block)   . Migraine    "stopped many years ago" (01/13/2014)  . Mini stroke ([place]) 01/2014  . Other primary cardiomyopathies   . Overactive bladder   . Presence of combination internal cardiac defibrillator (ICD) and pacemaker   . Reflex sympathetic dystrophy, unspecified   . Shingles   . Shortness of breath    ambulation  . Stroke ([place])   . Thyroid disease   . Type II diabetes mellitus ([place])   . Ulcer   . Urine incontinence   . Vertigo    hx of    Patient Active Problem List   Diagnosis Date Noted  . AICD (automatic cardioverter/defibrillator) present 06/11/2018  . Enterococcal bacteremia 06/11/2018  . Sepsis ([place]) 06/10/2018  . Abdominal pain 06/10/2018  . Abnormal LFTs 06/10/2018  . GERD (gastroesophageal reflux disease) 06/10/2018  . Acute renal failure superimposed on stage 3 chronic kidney disease ([place]) 06/10/2018  . Cholecystitis 06/10/2018  . Tingling 10/06/2017  . Chronic back pain 05/23/2017  . Chronic systolic CHF (congestive heart failure) ([place]) 02/25/2017  . Osteopenia 06/16/2016  . Routine general medical examination at a health care facility 04/24/2016  . Estrogen deficiency 04/24/2016  . Urticaria 11/03/2015  . Poorly controlled type 2 diabetes mellitus ([place]) 06/21/2015  . Encounter for Medicare annual wellness exam 06/13/2014  . Stroke, small vessel ([place]) 01/18/2014  . Family history of hemochromatosis 01/18/2014  . Cerebral thrombosis with cerebral infarction ([place]) 01/14/2014  . Expressive aphasia 01/13/2014  . Nonischemic cardiomyopathy ([place]) 04/05/2013  . Shortness of breath 09/03/2012  . Gallstones 05/26/2012  . Elevated transaminase level 05/18/2012  . Spinal stenosis of lumbar region 12/10/2011  . Mixed incontinence urge and stress 12/10/2011  . Renal insufficiency 11/04/2011  . Goiter 01/14/2011  . Fatty liver 08/28/2010  . Type II diabetes mellitus with renal manifestations ([place]) 07/09/2010  . HYPERCHOLESTEROLEMIA 07/09/2010  .  HYPERKALEMIA 07/09/2010  . REFLEX SYMPATHETIC DYSTROPHY 07/09/2010  . Neuropathy 07/09/2010  . Essential hypertension 07/09/2010  . CARDIOMYOPATHY 07/09/2010  . OVERACTIVE BLADDER 07/09/2010    Past Surgical History:  Procedure Laterality Date  . BACK SURGERY    . BI-VENTRICULAR PACEMAKER INSERTION (CRT-P)  10/2014   [facility]  . BREAST CYST EXCISION Left 1959  . CARDIAC CATHETERIZATION  "several"   [place]  . CATARACT EXTRACTION W/ INTRAOCULAR LENS IMPLANT Left ~ 2005   several eye injections    . CHOLECYSTECTOMY N/A 06/13/2018   Procedure: LAPAROSCOPIC CHOLECYSTECTOMY;  Surgeon: Donnie Mesa, MD;  Location: [facility];  Service: General;  Laterality: N/A;  . COLONOSCOPY  7/12   normal (hx of polyps in past)   . CT SCAN  3/12   outside hosp- lung nodule and gallstones  . ERCP N/A 06/12/2018   Procedure: ENDOSCOPIC RETROGRADE CHOLANGIOPANCREATOGRAPHY (ERCP);  Surgeon: Carol Ada, MD;  Location: [facility];  Service: Endoscopy;  Laterality: N/A;  . [place]   "knot on index"  . KIDNEY DONATION Left 1989  . LUMBAR LAMINECTOMY/DECOMPRESSION MICRODISCECTOMY  1999  . LUMBAR LAMINECTOMY/DECOMPRESSION MICRODISCECTOMY  01/31/2012   Procedure: LUMBAR LAMINECTOMY/DECOMPRESSION MICRODISCECTOMY 2 LEVELS;  Surgeon: Eustace Moore, MD;  Location: [facility];  Service: Neurosurgery;  Laterality: N/A;  Thoracic twelve-lumbar one, lumbar one-two laminectomy   . POSTERIOR LAMINECTOMY / DECOMPRESSION CERVICAL SPINE  1999  . REMOVAL OF STONES  06/12/2018   Procedure: REMOVAL OF STONES;  Surgeon: Carol Ada, MD;  Location: [facility] ENDOSCOPY;  Service: Endoscopy;;  . Joan Mayans  06/12/2018   Procedure: Joan Mayans;  Surgeon: Carol Ada, MD;  Location: [facility] ENDOSCOPY;  Service: Endoscopy;;  . [place]  . UPPER GASTROINTESTINAL ENDOSCOPY       OB History   No obstetric history on file.      Home Medications    Prior to Admission medications   Medication Sig Start Date End Date Taking? Authorizing Provider  albuterol (PROVENTIL HFA;VENTOLIN HFA) 108 (90 Base) MCG/ACT inhaler Inhale 2 puffs into the lungs every 4 (four) hours as needed for wheezing. 05/13/18  Yes Tower, [name], MD  aspirin 325 MG tablet Take 325 mg by mouth daily.   Yes [provider]  atorvastatin (LIPITOR) 80 MG tablet Take 80 mg by mouth daily.   Yes [provider]  benzonatate (TESSALON) 200 MG capsule Take 1 capsule (200 mg total) by mouth 3 (three) times daily as  needed. Do not bite pill 05/13/18  Yes Tower, [name], MD  carvedilol (COREG) 12.5 MG tablet Take 1 tablet (12.5 mg total) by mouth 2 (two) times daily with a meal. 08/03/13  Yes Gollan, Kathlene November, MD  cetirizine (ZYRTEC) 10 MG tablet Take 10 mg by mouth 2 (two) times daily.   Yes [provider]  Cholecalciferol (VITAMIN D-3) 1000 units CAPS Take 1 capsule by mouth daily. 05/10/09  Yes [provider]  clotrimazole-betamethasone (LOTRISONE) cream Apply 1 application topically as needed (groin).  06/19/15  Yes [provider]  dapsone 25 MG tablet Take 25 mg by mouth daily.    Yes [provider]  gabapentin (NEURONTIN) 300 MG capsule Take 1 capsule (300 mg total) by mouth 3 (three) times daily. 06/09/18  Yes Tower, Marne A, MD  glimepiride (AMARYL) 1 MG tablet Take 2 mg by mouth daily.    Yes [provider]  hydrALAZINE (APRESOLINE) 50 MG tablet Take 1 tablet by mouth 2 (two) times  daily. 02/28/14  Yes [provider]  isosorbide mononitrate (IMDUR) 30 MG 24 hr tablet Take 1 tablet (30 mg total) by mouth daily. 08/03/13  Yes Minna Merritts, MD  letrozole [facility]) 2.5 MG tablet Take 2.5 mg by mouth daily.   Yes [provider]  losartan (COZAAR) 50 MG tablet Take 50 mg by mouth daily.   Yes [provider]  metFORMIN (GLUCOPHAGE-XR) 500 MG 24 hr tablet Take 1,000 mg by mouth 2 (two) times daily.   Yes [provider]  MILK THISTLE PO Take 1 capsule by mouth 2 (two) times daily.    Yes [provider]  mirabegron ER ([name]) 50 MG TB24 tablet Take 50 mg by mouth daily.   Yes [provider]  nitroGLYCERIN (NITROSTAT) 0.4 MG SL tablet Place 0.4 mg under the tongue every 5 (five) minutes as needed for chest pain.   Yes [provider]  pantoprazole (PROTONIX) 40 MG tablet Take 1 tablet (40 mg total) by mouth daily. 06/09/18  Yes Tower, [name], MD  promethazine-dextromethorphan (PROMETHAZINE-DM)  6.25-15 MG/5ML syrup TAKE 5 ML BY MOUTH 4 TIMES DAILY AS NEEDED FOR COUGH (CAUTION  SEDATION) Patient taking differently: Take 5 mLs by mouth 4 (four) times daily as needed for cough.  05/22/18  Yes Tower, [name], MD  traMADol (ULTRAM) 50 MG tablet Take 1-2 tablets (50-100 mg total) by mouth every 8 (eight) hours as needed for moderate pain or severe pain. 03/09/18  Yes Tower, [name], MD  vitamin B-12 (CYANOCOBALAMIN) 1000 MCG tablet Take 1,000 mcg by mouth daily.    Yes [provider]  amoxicillin (AMOXIL) 500 MG tablet Take 1 tablet (500 mg total) by mouth 2 (two) times daily. 06/15/18   Mikhail, Velta Addison, DO  glucose blood (ONE TOUCH ULTRA TEST) test strip Use 3 (three) times daily. 02/08/16   [provider]  oxyCODONE (OXY IR/ROXICODONE) 5 MG immediate release tablet Take 1 tablet (5 mg total) by mouth every 6 (six) hours as needed for moderate pain, severe pain or breakthrough pain. 06/15/18   Cristal Ford, DO    Family History Family History  Problem Relation Age of Onset  . Arthritis Mother   . Cancer Mother        uterine and mouth  . Hyperlipidemia Mother   . Stroke Mother   . Hypertension Mother   . Alcohol abuse Father   . Diabetes Father   . Cancer Sister        breast  . Diabetes Sister   . Cancer Brother        lung cancer  . Kidney disease Brother   . Diabetes Brother   . Hyperlipidemia Sister   . Heart disease Sister   . Hypertension Sister   . Diabetes Sister   . Hyperlipidemia Brother   . Kidney failure Brother   . Diabetes Daughter   . Addison's disease Other     Social History Social History   Tobacco Use  . Smoking status: Never Smoker  . Smokeless tobacco: Never Used  Substance Use Topics  . Alcohol use: No    Alcohol/week: 0.0 standard drinks  . Drug use: No     Allergies   Ace inhibitors; Codeine; Hydrochlorothiazide; Insulin detemir; Lisinopril; Nsaids; and Tylenol [acetaminophen]   Review of Systems Review of  Systems  All systems reviewed and negative, other than as noted in HPI.  Physical Exam Updated Vital Signs BP (!) 158/67   Pulse 60  Temp 97.9 F (36.6 C) (Oral)   Resp 20   Ht 5\' 1"  (1.549 m)   Wt 64.2 kg   LMP 06/03/1992   SpO2 91%   BMI 26.74 kg/m   Physical Exam Vitals signs and nursing note reviewed.  Constitutional:      General: She is not in acute distress.    Appearance: She is well-developed.  HENT:     Head: Normocephalic and atraumatic.  Eyes:     General:        Right eye: No discharge.        Left eye: No discharge.     Conjunctiva/sclera: Conjunctivae normal.  Neck:     Musculoskeletal: Neck supple.  Cardiovascular:     Rate and Rhythm: Normal rate and regular rhythm.     Heart sounds: Normal heart sounds. No murmur. No friction rub. No gallop.   Pulmonary:     Effort: Pulmonary effort is normal. No respiratory distress.     Breath sounds: Normal breath sounds.  Abdominal:     General: There is no distension.     Palpations: Abdomen is soft.     Tenderness: There is abdominal tenderness. There is no guarding.     Comments: Epigastric ttp  Musculoskeletal:        General: No tenderness.  Skin:    General: Skin is warm and dry.  Neurological:     Mental Status: She is alert.  Psychiatric:        Behavior: Behavior normal.        Thought Content: Thought content normal.      ED Treatments / Results  Labs (all labs ordered are listed, but only abnormal results are displayed) Labs Reviewed  CULTURE, BLOOD (ROUTINE X 2) - Abnormal; Notable for the following components:      Result Value   Culture ENTEROCOCCUS FAECIUM (*)    All other components within normal limits  BLOOD CULTURE ID PANEL (REFLEXED) - Abnormal; Notable for the following components:   Enterococcus species DETECTED (*)    All other components within normal limits  COMPREHENSIVE METABOLIC PANEL - Abnormal; Notable for the following components:   Glucose, Bld 159 (*)     Creatinine, Ser 1.24 (*)    Total Protein 6.2 (*)    Albumin 3.0 (*)    AST 270 (*)    ALT 105 (*)    Alkaline Phosphatase 316 (*)    Total Bilirubin 3.2 (*)    GFR calc non Af Amer 41 (*)    GFR calc Af Amer 47 (*)    All other components within normal limits  CBC WITH DIFFERENTIAL/PLATELET - Abnormal; Notable for the following components:   RBC 3.63 (*)    Hemoglobin 11.0 (*)    HCT 35.8 (*)    All other components within normal limits  URINALYSIS, ROUTINE W REFLEX MICROSCOPIC - Abnormal; Notable for the following components:   Color, Urine AMBER (*)    Protein, ur 100 (*)    All other components within normal limits  BRAIN NATRIURETIC PEPTIDE - Abnormal; Notable for the following components:   B Natriuretic Peptide 220.2 (*)    All other components within normal limits  BASIC METABOLIC PANEL - Abnormal; Notable for the following components:   Glucose, Bld 198 (*)    Creatinine, Ser 1.48 (*)    Calcium 8.3 (*)    GFR calc non Af Amer 33 (*)    GFR calc Af Amer 38 (*)  All other components within normal limits  CBC - Abnormal; Notable for the following components:   WBC 12.3 (*)    RBC 3.17 (*)    Hemoglobin 9.7 (*)    HCT 31.7 (*)    All other components within normal limits  LACTIC ACID, PLASMA - Abnormal; Notable for the following components:   Lactic Acid, Venous 2.2 (*)    All other components within normal limits  HEPATIC FUNCTION PANEL - Abnormal; Notable for the following components:   Total Protein 5.5 (*)    Albumin 2.7 (*)    AST 264 (*)    ALT 128 (*)    Alkaline Phosphatase 256 (*)    Total Bilirubin 4.3 (*)    Bilirubin, Direct 2.7 (*)    Indirect Bilirubin 1.6 (*)    All other components within normal limits  COMPREHENSIVE METABOLIC PANEL - Abnormal; Notable for the following components:   Glucose, Bld 65 (*)    Creatinine, Ser 1.16 (*)    Calcium 7.6 (*)    Total Protein 5.2 (*)    Albumin 2.3 (*)    AST 107 (*)    ALT 81 (*)    Alkaline  Phosphatase 211 (*)    Total Bilirubin 3.7 (*)    GFR calc non Af Amer 44 (*)    GFR calc Af Amer 51 (*)    All other components within normal limits  CBC - Abnormal; Notable for the following components:   RBC 2.89 (*)    Hemoglobin 9.0 (*)    HCT 28.7 (*)    All other components within normal limits  GLUCOSE, CAPILLARY - Abnormal; Notable for the following components:   Glucose-Capillary 55 (*)    All other components within normal limits  GLUCOSE, CAPILLARY - Abnormal; Notable for the following components:   Glucose-Capillary 225 (*)    All other components within normal limits  GLUCOSE, CAPILLARY - Abnormal; Notable for the following components:   Glucose-Capillary 51 (*)    All other components within normal limits  GLUCOSE, CAPILLARY - Abnormal; Notable for the following components:   Glucose-Capillary 148 (*)    All other components within normal limits  GLUCOSE, CAPILLARY - Abnormal; Notable for the following components:   Glucose-Capillary 111 (*)    All other components within normal limits  COMPREHENSIVE METABOLIC PANEL - Abnormal; Notable for the following components:   Potassium 5.3 (*)    Glucose, Bld 180 (*)    Creatinine, Ser 1.48 (*)    Calcium 8.3 (*)    Total Protein 5.0 (*)    Albumin 2.2 (*)    AST 58 (*)    ALT 60 (*)    Alkaline Phosphatase 221 (*)    Total Bilirubin 2.2 (*)    GFR calc non Af Amer 33 (*)    GFR calc Af Amer 38 (*)    All other components within normal limits  CBC - Abnormal; Notable for the following components:   RBC 3.04 (*)    Hemoglobin 9.4 (*)    HCT 31.1 (*)    MCV 102.3 (*)    All other components within normal limits  GLUCOSE, CAPILLARY - Abnormal; Notable for the following components:   Glucose-Capillary 161 (*)    All other components within normal limits  GLUCOSE, CAPILLARY - Abnormal; Notable for the following components:   Glucose-Capillary 174 (*)    All other components within normal limits  GLUCOSE, CAPILLARY  - Abnormal; Notable for the  following components:   Glucose-Capillary 170 (*)    All other components within normal limits  GLUCOSE, CAPILLARY - Abnormal; Notable for the following components:   Glucose-Capillary 171 (*)    All other components within normal limits  GLUCOSE, CAPILLARY - Abnormal; Notable for the following components:   Glucose-Capillary 148 (*)    All other components within normal limits  GLUCOSE, CAPILLARY - Abnormal; Notable for the following components:   Glucose-Capillary 120 (*)    All other components within normal limits  GLUCOSE, CAPILLARY - Abnormal; Notable for the following components:   Glucose-Capillary 116 (*)    All other components within normal limits  GLUCOSE, CAPILLARY - Abnormal; Notable for the following components:   Glucose-Capillary 291 (*)    All other components within normal limits  COMPREHENSIVE METABOLIC PANEL - Abnormal; Notable for the following components:   Glucose, Bld 323 (*)    Creatinine, Ser 1.57 (*)    Calcium 7.8 (*)    Total Protein 5.0 (*)    Albumin 2.1 (*)    AST 90 (*)    ALT 66 (*)    Alkaline Phosphatase 192 (*)    GFR calc non Af Amer 31 (*)    GFR calc Af Amer 35 (*)    All other components within normal limits  HEMOGLOBIN AND HEMATOCRIT, BLOOD - Abnormal; Notable for the following components:   Hemoglobin 9.5 (*)    HCT 30.3 (*)    All other components within normal limits  GLUCOSE, CAPILLARY - Abnormal; Notable for the following components:   Glucose-Capillary 304 (*)    All other components within normal limits  GLUCOSE, CAPILLARY - Abnormal; Notable for the following components:   Glucose-Capillary 296 (*)    All other components within normal limits  GLUCOSE, CAPILLARY - Abnormal; Notable for the following components:   Glucose-Capillary 289 (*)    All other components within normal limits  OSMOLALITY - Abnormal; Notable for the following components:   Osmolality 302 (*)    All other components within  normal limits  GLUCOSE, CAPILLARY - Abnormal; Notable for the following components:   Glucose-Capillary 282 (*)    All other components within normal limits  COMPREHENSIVE METABOLIC PANEL - Abnormal; Notable for the following components:   Glucose, Bld 242 (*)    Creatinine, Ser 1.39 (*)    Calcium 8.0 (*)    Total Protein 5.0 (*)    Albumin 2.3 (*)    AST 68 (*)    ALT 61 (*)    Alkaline Phosphatase 182 (*)    Total Bilirubin 1.4 (*)    GFR calc non Af Amer 35 (*)    GFR calc Af Amer 41 (*)    All other components within normal limits  HEMOGLOBIN AND HEMATOCRIT, BLOOD - Abnormal; Notable for the following components:   Hemoglobin 9.3 (*)    HCT 30.7 (*)    All other components within normal limits  GLUCOSE, CAPILLARY - Abnormal; Notable for the following components:   Glucose-Capillary 263 (*)    All other components within normal limits  GLUCOSE, CAPILLARY - Abnormal; Notable for the following components:   Glucose-Capillary 244 (*)    All other components within normal limits  GLUCOSE, CAPILLARY - Abnormal; Notable for the following components:   Glucose-Capillary 162 (*)    All other components within normal limits  I-STAT CG4 LACTIC ACID, ED - Abnormal; Notable for the following components:   Lactic Acid, Venous 2.92 (*)  All other components within normal limits  CBG MONITORING, ED - Abnormal; Notable for the following components:   Glucose-Capillary 100 (*)    All other components within normal limits  CBG MONITORING, ED - Abnormal; Notable for the following components:   Glucose-Capillary 208 (*)    All other components within normal limits  CBG MONITORING, ED - Abnormal; Notable for the following components:   Glucose-Capillary 110 (*)    All other components within normal limits  CULTURE, BLOOD (ROUTINE X 2)  CULTURE, BLOOD (ROUTINE X 2)  CULTURE, BLOOD (ROUTINE X 2)  SURGICAL PCR SCREEN  PROTIME-INR  INFLUENZA PANEL BY PCR (TYPE A & B)  HEPATITIS PANEL,  ACUTE  APTT  LACTIC ACID, PLASMA  PROCALCITONIN  LIPASE, BLOOD  GLUCOSE, CAPILLARY  SODIUM, URINE, RANDOM  OSMOLALITY, URINE  I-STAT CG4 LACTIC ACID, ED  TYPE AND SCREEN  ABO/RH  SURGICAL PATHOLOGY    EKG EKG Interpretation  Date/Time:  Wednesday June 10 2018 17:30:54 EST Ventricular Rate:  88 PR Interval:    QRS Duration: 118 QT Interval:  367 QTC Calculation: 444 R Axis:   -52 Text Interpretation:  Sinus rhythm Biatrial enlargement Left anterior fascicular block LVH with secondary repolarization abnormality Anterior Q waves, possibly due to LVH Confirmed by Virgel Manifold [phone]) on 06/10/2018 7:04:03 PM   Radiology No results found.   Dg Chest 2 View  Result Date: 06/10/2018 CLINICAL DATA:  Chills, headache, and body aches all day. Diagnosed with the flu. EXAM: CHEST - 2 VIEW COMPARISON:  05/13/2018. FINDINGS: Stable hyperinflation. LEFT AICD unchanged. Cardiomegaly. Thoracic atherosclerosis. Clear lung fields. Straightening of the normal thoracic kyphosis. No worrisome osseous lesion. Stable appearance from priors. IMPRESSION: No active cardiopulmonary disease. Electronically Signed   By: Staci Righter M.D.   On: 06/10/2018 18:24   Nm Hepatobiliary Liver Func  Result Date: 06/12/2018 CLINICAL DATA:  84 year old with cholelithiasis. EXAM: NUCLEAR MEDICINE HEPATOBILIARY IMAGING TECHNIQUE: Sequential images of the abdomen were obtained following intravenous administration of radiopharmaceutical. 2.6 mg of morphine was administered approximately 90 minutes after the radiopharmaceutical injection. Additional 30 minutes of imaging was obtained after the morphine injection. RADIOPHARMACEUTICALS:  5.3 mCi Tc-35m  Choletec IV COMPARISON:  Ultrasound 06/10/2018 FINDINGS: Prompt uptake and biliary excretion of activity in the liver. Activity identified in the small bowel compatible with a patent common bile duct. However, there is no activity identified in the gallbladder throughout this  examination. IMPRESSION: No gallbladder uptake even after the administration of morphine. Findings are suggestive for cystic duct obstruction and concerning for cholecystitis. Electronically Signed   By: Markus Daft M.D.   On: 06/12/2018 11:20   US Renal  Result Date: 06/14/2018 CLINICAL DATA:  Acute kidney injury. Left kidney donation. Chronic kidney disease, stage III. Hypertension. EXAM: RENAL / URINARY TRACT ULTRASOUND COMPLETE COMPARISON:  CT abdomen and pelvis 08/27/2011 FINDINGS: Right Kidney: Renal measurements: 10.2 x 5.1 x 5.3 cm = volume: 145 mL. Normal parenchymal echotexture and thickness. Several tiny cysts are demonstrated, measuring up to about 7 mm maximal diameter. No solid mass or hydronephrosis. Left Kidney: Left kidney is surgically absent. Bladder: Appears normal for degree of bladder distention. IMPRESSION: Tiny right renal parenchymal cysts. No hydronephrosis. Surgical absence of left kidney. Electronically Signed   By: Lucienne Capers M.D.   On: 06/14/2018 21:02   US Abdomen Limited  Result Date: 06/10/2018 CLINICAL DATA:  Fever, abnormal liver function tests and heartburn. Known gallstones. EXAM: ULTRASOUND ABDOMEN LIMITED RIGHT UPPER QUADRANT COMPARISON:  None. FINDINGS:  Gallbladder: Mild single wall thickness of the gallbladder is suggested to 5 mm, normal at 3 mm or less. The gallbladder is physiologically distended without pathologic distention however. There also appear to be echogenic shadowing gallstones up to 1.3 cm near the neck of the gallbladder admixed with sludge. Sonographer indicates no sonographic Murphy's sign. Common bile duct: Diameter: 10 mm without choledocholithiasis. Liver: No focal lesion identified. Within normal limits in parenchymal echogenicity. Portal vein is patent on color Doppler imaging with normal direction of blood flow towards the liver. IMPRESSION: 1. Slightly thickened appearance of the gallbladder without significant distention nor  pericholecystic fluid. Intraluminal biliary sludge and gallstones are identified. Findings may be secondary to a chronic cholecystitis. HIDA scan may help for further assessment. 2. Dilated common bowel duct to 10 mm without choledocholithiasis. There is a slightly above expected for age. Recently passed stone may contribute to this appearance. Electronically Signed   By: Ashley Royalty M.D.   On: 06/10/2018 19:40   Dg Ercp Biliary & Pancreatic Ducts  Result Date: 06/12/2018 CLINICAL DATA:  84 year old female undergoing ERCP for possible choledocholithiasis EXAM: ERCP TECHNIQUE: Multiple spot images obtained with the fluoroscopic device and submitted for interpretation post-procedure. FLUOROSCOPY TIME:  Fluoroscopy Time:  1 minutes 35 seconds reported COMPARISON:  Nuclear medicine HIDA scan 06/12/2018 FINDINGS: A total of 4 images are submitted for review. The images demonstrate a flexible endoscope in the descending duodenum with wire cannulation of the left intrahepatic ducts. Cholangiogram demonstrates filling defects in the common bile duct consistent with choledocholithiasis. Subsequent images demonstrate sphincterotomy and balloon sweep of the common duct. Contrast is present within the duodenum on the final image. IMPRESSION: ERCP as above. These images were submitted for radiologic interpretation only. Please see the procedural report for the amount of contrast and the fluoroscopy time utilized. Electronically Signed   By: Jacqulynn Cadet M.D.   On: 06/12/2018 14:33    Procedures Procedures (including critical care time)  Medications Ordered in ED Medications  white petrolatum (VASELINE) gel (has no administration in time range)  morphine 4 MG/ML injection (has no administration in time range)  fentaNYL (SUBLIMAZE) 100 MCG/2ML injection (has no administration in time range)  acetaminophen (TYLENOL) tablet 1,000 mg (1,000 mg Oral Given 06/10/18 1838)  ceFEPIme (MAXIPIME) 2 g in sodium chloride  0.9 % 100 mL IVPB (0 g Intravenous Stopped 06/10/18 2000)  metroNIDAZOLE (FLAGYL) IVPB 500 mg (0 mg Intravenous Stopped 06/10/18 2153)  sodium chloride 0.9 % bolus 250 mL (0 mLs Intravenous Stopped 06/10/18 2250)  sodium chloride 0.9 % bolus 250 mL (0 mLs Intravenous Stopped 06/11/18 0659)  technetium TC 81M mebrofenin (CHOLETEC) injection 5 millicurie (5 millicuries Intravenous Contrast Given 06/12/18 0800)  morphine 4 MG/ML injection 2.6 mg (2.6 mg Intravenous Given 06/12/18 1004)  dextrose 50 % solution (50 mLs  Given 06/12/18 1113)  Chlorhexidine Gluconate Cloth 2 % PADS 6 each (6 each Topical Given 06/12/18 2036)    And  Chlorhexidine Gluconate Cloth 2 % PADS 6 each (6 each Topical Given 06/13/18 0558)  insulin aspart (novoLOG) injection 5 Units (5 Units Subcutaneous Given 06/13/18 2152)     Initial Impression / Assessment and Plan / ED Course  I have reviewed the triage vital signs and the nursing notes.  Pertinent labs & imaging results that were available during my care of the patient were reviewed by me and considered in my medical decision making (see chart for details).     83yF with general malaise. Concerning for cholecystitis/possible  choledocholithiasis. Abx. Admit.   Final Clinical Impressions(s) / ED Diagnoses   Final diagnoses:  Abdominal pain  Gallstones  Enterococcal bacteremia  AKI (acute kidney injury) [facility])    ED Discharge Orders         Ordered    amoxicillin (AMOXIL) 500 MG tablet  2 times daily     06/15/18 0858    Discharge instructions    Comments:  Patient will be discharged to home.  Patient will need to follow up with primary care provider within one week of discharge, repeat CMP. Follow up with nephrology.  Patient should continue medications as prescribed.  Patient should follow a carb modified diet.   Hold cholesterol medication until your liver function has completely normalized.   06/15/18 0858    oxyCODONE (OXY IR/ROXICODONE) 5 MG immediate release  tablet  Every 6 hours PRN     06/15/18 0916    oxyCODONE (OXY IR/ROXICODONE) 5 MG immediate release tablet  Every 6 hours PRN,   Status:  Discontinued     06/14/18 0759           Virgel Manifold, MD 06/21/18 1607

## 2018-06-22 ENCOUNTER — Ambulatory Visit (INDEPENDENT_AMBULATORY_CARE_PROVIDER_SITE_OTHER): Payer: Medicare HMO | Admitting: Family Medicine

## 2018-06-22 ENCOUNTER — Encounter: Payer: Self-pay | Admitting: Family Medicine

## 2018-06-22 VITALS — BP 146/78 | HR 73 | Temp 98.2°F | Ht 61.0 in | Wt 146.8 lb

## 2018-06-22 DIAGNOSIS — E7800 Pure hypercholesterolemia, unspecified: Secondary | ICD-10-CM | POA: Diagnosis not present

## 2018-06-22 DIAGNOSIS — Z8719 Personal history of other diseases of the digestive system: Secondary | ICD-10-CM

## 2018-06-22 DIAGNOSIS — R7881 Bacteremia: Secondary | ICD-10-CM

## 2018-06-22 DIAGNOSIS — N179 Acute kidney failure, unspecified: Secondary | ICD-10-CM | POA: Diagnosis not present

## 2018-06-22 DIAGNOSIS — K819 Cholecystitis, unspecified: Secondary | ICD-10-CM

## 2018-06-22 DIAGNOSIS — K760 Fatty (change of) liver, not elsewhere classified: Secondary | ICD-10-CM

## 2018-06-22 DIAGNOSIS — N183 Chronic kidney disease, stage 3 (moderate): Secondary | ICD-10-CM | POA: Diagnosis not present

## 2018-06-22 DIAGNOSIS — R740 Nonspecific elevation of levels of transaminase and lactic acid dehydrogenase [LDH]: Secondary | ICD-10-CM | POA: Diagnosis not present

## 2018-06-22 DIAGNOSIS — R600 Localized edema: Secondary | ICD-10-CM | POA: Diagnosis not present

## 2018-06-22 DIAGNOSIS — I10 Essential (primary) hypertension: Secondary | ICD-10-CM | POA: Diagnosis not present

## 2018-06-22 DIAGNOSIS — E1129 Type 2 diabetes mellitus with other diabetic kidney complication: Secondary | ICD-10-CM

## 2018-06-22 DIAGNOSIS — B952 Enterococcus as the cause of diseases classified elsewhere: Secondary | ICD-10-CM

## 2018-06-22 DIAGNOSIS — R7401 Elevation of levels of liver transaminase levels: Secondary | ICD-10-CM

## 2018-06-22 LAB — CBC WITH DIFFERENTIAL/PLATELET
Basophils Absolute: 0 10*3/uL (ref 0.0–0.1)
Basophils Relative: 0.6 % (ref 0.0–3.0)
Eosinophils Absolute: 0.4 10*3/uL (ref 0.0–0.7)
Eosinophils Relative: 5.4 % — ABNORMAL HIGH (ref 0.0–5.0)
HCT: 33.5 % — ABNORMAL LOW (ref 36.0–46.0)
Hemoglobin: 11.1 g/dL — ABNORMAL LOW (ref 12.0–15.0)
LYMPHS PCT: 30.5 % (ref 12.0–46.0)
Lymphs Abs: 2.2 10*3/uL (ref 0.7–4.0)
MCHC: 33 g/dL (ref 30.0–36.0)
MCV: 95.8 fl (ref 78.0–100.0)
Monocytes Absolute: 0.7 10*3/uL (ref 0.1–1.0)
Monocytes Relative: 9.2 % (ref 3.0–12.0)
Neutro Abs: 4 10*3/uL (ref 1.4–7.7)
Neutrophils Relative %: 54.3 % (ref 43.0–77.0)
Platelets: 236 10*3/uL (ref 150.0–400.0)
RBC: 3.5 Mil/uL — ABNORMAL LOW (ref 3.87–5.11)
RDW: 14.4 % (ref 11.5–15.5)
WBC: 7.3 10*3/uL (ref 4.0–10.5)

## 2018-06-22 LAB — HEPATIC FUNCTION PANEL
ALT: 16 U/L (ref 0–35)
AST: 25 U/L (ref 0–37)
Albumin: 3.1 g/dL — ABNORMAL LOW (ref 3.5–5.2)
Alkaline Phosphatase: 162 U/L — ABNORMAL HIGH (ref 39–117)
BILIRUBIN DIRECT: 0.4 mg/dL — AB (ref 0.0–0.3)
Total Bilirubin: 1.1 mg/dL (ref 0.2–1.2)
Total Protein: 5.9 g/dL — ABNORMAL LOW (ref 6.0–8.3)

## 2018-06-22 LAB — RENAL FUNCTION PANEL
Albumin: 3.1 g/dL — ABNORMAL LOW (ref 3.5–5.2)
BUN: 9 mg/dL (ref 6–23)
CO2: 34 mEq/L — ABNORMAL HIGH (ref 19–32)
Calcium: 8.8 mg/dL (ref 8.4–10.5)
Chloride: 108 mEq/L (ref 96–112)
Creatinine, Ser: 0.95 mg/dL (ref 0.40–1.20)
GFR: 56.38 mL/min — ABNORMAL LOW (ref >60.00)
Glucose, Bld: 83 mg/dL (ref 70–99)
Phosphorus: 2.9 mg/dL (ref 2.3–4.6)
Potassium: 5.6 mEq/L — ABNORMAL HIGH (ref 3.5–5.1)
Sodium: 148 mEq/L — ABNORMAL HIGH (ref 135–145)

## 2018-06-22 NOTE — Progress Notes (Signed)
Subjective:    Patient ID: Morgan Hubbard, female    DOB: [birth year], 84 y.o.   MRN: [id number]  HPI  Here for hosp f/u   Pt was hosp from 1/8 to 1/13 for cholecystitis with bacteremia/sepsis  (enterococcal)  Last visit was sent to ED from here for fever and vomiting (shortly after having the flu)  Hospital course as follows :  Sepsis abdominal pain secondary to cholecystitis/cholelithiasis/bacteremia -Patient admitted with fever, tachypnea and elevated lactic acid of 2.93 -Currently hemodynamically stable -HIDA scan positive -Gastroenterology consulted and appreciated, status post ERCP -General surgery consulted and appreciated, status post laparoscopic cholecystectomy today -Continue Zosyn, patient did receive 1 dose of cefepime and Flagyl in the emergency department -Blood cultures positive for enterococcus faecium -Infectious disease consulted and appreciated, recommending continuation of Zosyn with echocardiogram and repeat cultures -Discussed with Dr. [name] (ID), no need for TEE, recommended Amoxicillin on discharge -Repeat cultures on 06/12/2018 show no growth to date -Echocardiogram negative for vegetation -transitioned to amoxicillin -Follow up with general sugery  Went home on amoxicillin  Feeling much better /still tired   Enterococcal bacteremia -Treatment plan as above  Blood cultures now negative   Elevated LFTs -Secondary to the above -LFTs trending downward, currently AST 68, ALT 61 -repeat CMP in one week  Acute kidney injury on chronic kidney disease, stage III -Baseline creatinine 0.97 (in 2018), patient presented with creatinine 1.24 which peaked to 1.57, down to 1.3 -patient with solitary kidney -GFR still within stage IIIhowever worse than baseline -Placed on patient on IVF, discontinue zosyn -patient did receive contrast, vancomycin, indocin -Renal ultrasound showed tiny right renal parenchymal cyst.  No hydronephrosis.  Surgical absence of  left kidney. -Repeat CMP in 1 week -Patient should follow-up with nephrology  We ref to renal  She does not have an appt yet   Chronic systolic/diastolic heart failure -Echocardiogram showed an EF of 40 to 45%. Diffuse hypokinesis, grade 2 diastolic dysfunction. No evidence of vegetation. Marked improvement in LV function since 2015. -appears to be euvolemic and compensated -Continue to monitor intake and output, daily weights  Diabetes mellitus, type II -Hemoglobin A1c 7.7 on 05/02/2018 -Currently on insulin sliding scale with CBG monitoring -Metformin and Amaryl held- may resume on discharge  Glucose was 115 this am  Was in the 60s once  She is starting to eat better   Hypercholesteremia -Lipitor was held due to abnormal liver function -continue to hold until LFTs have normalized She re started it   Essential hypertension -hold HCTZ, may resume losartan on discharge with close renal monitoring   GERD -Continue PPI  Allergies -discussed seeing and Allergist on discharge -Continue H2 blockers  Consultants Gastroenterology General surgery Infectious disease  Procedures  Echocardiogram HIDA scan ERCP Laparoscopic cholecystectomy  Discharge Exam:     Vitals:   06/15/18 0401 06/15/18 0524  BP: (!) 181/73 (!) 158/67  Pulse     Most recent labs Lab Results  Component Value Date   ALT 61 (H) 06/15/2018   AST 68 (H) 06/15/2018   ALKPHOS 182 (H) 06/15/2018   BILITOT 1.4 (H) 06/15/2018   Lab Results  Component Value Date   CREATININE 1.39 (H) 06/15/2018   BUN 15 06/15/2018   NA 138 06/15/2018   K 4.8 06/15/2018   CL 107 06/15/2018   CO2 26 06/15/2018    Lab Results  Component Value Date   WBC 5.3 06/13/2018   HGB 9.3 (L) 06/15/2018   HCT 30.7 (L) 06/15/2018  MCV 102.3 (H) 06/13/2018   PLT 187 06/13/2018   with glucose of 162  Here hctz was held due to renal issues  She ran out of losartan  bp is high today BP Readings from Last 3  Encounters:  06/22/18 (!) 146/78  06/15/18 (!) 158/67  06/09/18 134/60   Pulse Readings from Last 3 Encounters:  06/22/18 73  06/15/18 60  06/09/18 68   Today Wt Readings from Last 3 Encounters:  06/22/18 146 lb 12 oz (66.6 kg)  06/11/18 141 lb 8.6 oz (64.2 kg)  06/09/18 141 lb 8 oz (64.2 kg)   27.73 kg/m   Was very swollen at home after IVF and off hctz  She put on her compression stockings and kept feet up  Much better today  Stools have been dark No iron or pepto  No abd pain   Has surgical f/u   Patient Active Problem List   Diagnosis Date Noted  . Pedal edema 06/22/2018  . AICD (automatic cardioverter/defibrillator) present 06/11/2018  . Enterococcal bacteremia 06/11/2018  . Sepsis ([place]) 06/10/2018  . Abdominal pain 06/10/2018  . Abnormal LFTs 06/10/2018  . GERD (gastroesophageal reflux disease) 06/10/2018  . Acute renal failure superimposed on stage 3 chronic kidney disease ([place]) 06/10/2018  . Cholecystitis 06/10/2018  . Tingling 10/06/2017  . Chronic back pain 05/23/2017  . Chronic systolic CHF (congestive heart failure) ([place]) 02/25/2017  . Osteopenia 06/16/2016  . Routine general medical examination at a health care facility 04/24/2016  . Estrogen deficiency 04/24/2016  . Urticaria 11/03/2015  . Poorly controlled type 2 diabetes mellitus ([place]) 06/21/2015  . Encounter for Medicare annual wellness exam 06/13/2014  . Stroke, small vessel ([place]) 01/18/2014  . Family history of hemochromatosis 01/18/2014  . Cerebral thrombosis with cerebral infarction ([place]) 01/14/2014  . Expressive aphasia 01/13/2014  . Nonischemic cardiomyopathy ([place]) 04/05/2013  . Shortness of breath 09/03/2012  . Gallstones 05/26/2012  . Elevated transaminase level 05/18/2012  . Spinal stenosis of lumbar region 12/10/2011  . Mixed incontinence urge and stress 12/10/2011  . Renal insufficiency 11/04/2011  . Goiter 01/14/2011  . Fatty liver 08/28/2010  . Type II diabetes mellitus with  renal manifestations ([place]) 07/09/2010  . HYPERCHOLESTEROLEMIA 07/09/2010  . HYPERKALEMIA 07/09/2010  . REFLEX SYMPATHETIC DYSTROPHY 07/09/2010  . Neuropathy 07/09/2010  . Essential hypertension 07/09/2010  . CARDIOMYOPATHY 07/09/2010  . OVERACTIVE BLADDER 07/09/2010   Past Medical History:  Diagnosis Date  . Arthritis    "hands" (01/13/2014)  . Basal cell carcinoma 01/2014   "bridge of nose"  . Cardiac LV ejection fraction >40%    "it was 43 last year" (01/13/2014)  . Chronic lower back pain   . CKD (chronic kidney disease), stage III (HCC)    acute on chronic stage III/notes 06/10/2018  . Colon polyps   . Expressive aphasia    "3 times in the last week" (01/13/2014)  . GERD (gastroesophageal reflux disease)   . Goiter past remote   treated with RI  . Hyperlipidemia   . Hyperpotassemia   . Hypertension   . Hypertonicity of bladder   . Inflammatory and toxic neuropathy, unspecified   . LBBB (left bundle branch block)   . Migraine    "stopped many years ago" (01/13/2014)  . Mini stroke ([place]) 01/2014  . Other primary cardiomyopathies   . Overactive bladder   . Presence of combination internal cardiac defibrillator (ICD) and pacemaker   . Reflex sympathetic dystrophy, unspecified   . Shingles   . Shortness  of breath    ambulation  . Stroke ([place])   . Thyroid disease   . Type II diabetes mellitus ([place])   . Ulcer   . Urine incontinence   . Vertigo    hx of   Past Surgical History:  Procedure Laterality Date  . BACK SURGERY    . BI-VENTRICULAR PACEMAKER INSERTION (CRT-P)  10/2014   [facility]  . BREAST CYST EXCISION Left 1959  . CARDIAC CATHETERIZATION  "several"   [place]  . CATARACT EXTRACTION W/ INTRAOCULAR LENS IMPLANT Left ~ 2005   several eye injections  . CHOLECYSTECTOMY N/A 06/13/2018   Procedure: LAPAROSCOPIC CHOLECYSTECTOMY;  Surgeon: Donnie Mesa, MD;  Location: [facility];  Service: General;  Laterality: N/A;  . COLONOSCOPY  7/12   normal (hx of polyps in past)   .  CT SCAN  3/12   outside hosp- lung nodule and gallstones  . ERCP N/A 06/12/2018   Procedure: ENDOSCOPIC RETROGRADE CHOLANGIOPANCREATOGRAPHY (ERCP);  Surgeon: Carol Ada, MD;  Location: [facility];  Service: Endoscopy;  Laterality: N/A;  . [place]   "knot on index"  . KIDNEY DONATION Left 1989  . LUMBAR LAMINECTOMY/DECOMPRESSION MICRODISCECTOMY  1999  . LUMBAR LAMINECTOMY/DECOMPRESSION MICRODISCECTOMY  01/31/2012   Procedure: LUMBAR LAMINECTOMY/DECOMPRESSION MICRODISCECTOMY 2 LEVELS;  Surgeon: Eustace Moore, MD;  Location: [facility]URO [facility]S;  Service: Neurosurgery;  Laterality: N/A;  Thoracic twelve-lumbar one, lumbar one-two laminectomy   . POSTERIOR LAMINECTOMY / DECOMPRESSION CERVICAL SPINE  1999  . REMOVAL OF STONES  06/12/2018   Procedure: REMOVAL OF STONES;  Surgeon: Carol Ada, MD;  Location: [facility] ENDOSCOPY;  Service: Endoscopy;;  . Joan Mayans  06/12/2018   Procedure: Joan Mayans;  Surgeon: Carol Ada, MD;  Location: [facility] ENDOSCOPY;  Service: Endoscopy;;  . Normal  . UPPER GASTROINTESTINAL ENDOSCOPY     Social History   Tobacco Use  . Smoking status: Never Smoker  . Smokeless tobacco: Never Used  Substance Use Topics  . Alcohol use: No    Alcohol/week: 0.0 standard drinks  . Drug use: No   Family History  Problem Relation Age of Onset  . Arthritis Mother   . Cancer Mother        uterine and mouth  . Hyperlipidemia Mother   . Stroke Mother   . Hypertension Mother   . Alcohol abuse Father   . Diabetes Father   . Cancer Sister        breast  . Diabetes Sister   . Cancer Brother        lung cancer  . Kidney disease Brother   . Diabetes Brother   . Hyperlipidemia Sister   . Heart disease Sister   . Hypertension Sister   . Diabetes Sister   . Hyperlipidemia Brother   . Kidney failure Brother   . Diabetes Daughter   . Addison's disease Other    Allergies  Allergen Reactions  . Ace Inhibitors Swelling    REACTION:  tongue swelling  . Codeine Other (See Comments)    REACTION: nausea  . Hydrochlorothiazide Other (See Comments)    unknown  . Insulin Detemir Itching  . Lisinopril Swelling    Swelling of tongue  . Nsaids Other (See Comments)    Unable to take D/T kidney issues  . Tylenol [Acetaminophen] Other (See Comments)    Pt has one kidney and says she can't take it   Current Outpatient Medications on File Prior to Visit  Medication Sig Dispense Refill  .  aspirin 325 MG tablet Take 325 mg by mouth daily.    Marland Kitchen atorvastatin (LIPITOR) 80 MG tablet Take 80 mg by mouth daily.    . carvedilol (COREG) 12.5 MG tablet Take 1 tablet (12.5 mg total) by mouth 2 (two) times daily with a meal. 180 tablet 4  . cetirizine (ZYRTEC) 10 MG tablet Take 10 mg by mouth 2 (two) times daily.    . Cholecalciferol (VITAMIN D-3) 1000 units CAPS Take 1 capsule by mouth daily.    . clotrimazole-betamethasone (LOTRISONE) cream Apply 1 application topically as needed (groin).     . dapsone 25 MG tablet Take 25 mg by mouth daily.     Marland Kitchen gabapentin (NEURONTIN) 300 MG capsule Take 1 capsule (300 mg total) by mouth 3 (three) times daily. 270 capsule 3  . glimepiride (AMARYL) 1 MG tablet Take 2 mg by mouth daily.     Marland Kitchen glucose blood (ONE TOUCH ULTRA TEST) test strip Use 3 (three) times daily.    . hydrALAZINE (APRESOLINE) 50 MG tablet Take 1 tablet by mouth 2 (two) times daily.    . isosorbide mononitrate (IMDUR) 30 MG 24 hr tablet Take 1 tablet (30 mg total) by mouth daily. 90 tablet 4  . letrozole (FEMARA) 2.5 MG tablet Take 2.5 mg by mouth daily.    . metFORMIN (GLUCOPHAGE-XR) 500 MG 24 hr tablet Take 1,000 mg by mouth 2 (two) times daily.    Marland Kitchen MILK THISTLE PO Take 1 capsule by mouth 2 (two) times daily.     . mirabegron ER ([name]) 50 MG TB24 tablet Take 50 mg by mouth daily.    . nitroGLYCERIN (NITROSTAT) 0.4 MG SL tablet Place 0.4 mg under the tongue every 5 (five) minutes as needed for chest pain.    . pantoprazole  (PROTONIX) 40 MG tablet Take 1 tablet (40 mg total) by mouth daily. 90 tablet 3  . traMADol (ULTRAM) 50 MG tablet Take 1-2 tablets (50-100 mg total) by mouth every 8 (eight) hours as needed for moderate pain or severe pain. 60 tablet 0  . vitamin B-12 (CYANOCOBALAMIN) 1000 MCG tablet Take 1,000 mcg by mouth daily.     Marland Kitchen losartan (COZAAR) 50 MG tablet Take 50 mg by mouth daily.     No current facility-administered medications on file prior to visit.     Review of Systems  Constitutional: Positive for fatigue. Negative for activity change, appetite change, fever and unexpected weight change.  HENT: Negative for congestion, ear pain, rhinorrhea, sinus pressure and sore throat.   Eyes: Negative for pain, redness and visual disturbance.  Respiratory: Negative for cough, shortness of breath and wheezing.   Cardiovascular: Negative for chest pain and palpitations.  Gastrointestinal: Negative for abdominal pain, blood in stool, constipation and diarrhea.       Some soreness of abd incisions  Endocrine: Negative for polydipsia and polyuria.  Genitourinary: Negative for dysuria, frequency and urgency.  Musculoskeletal: Negative for arthralgias, back pain and myalgias.  Skin: Negative for pallor and rash.  Allergic/Immunologic: Negative for environmental allergies.  Neurological: Negative for dizziness, tremors, syncope, speech difficulty and headaches.  Hematological: Negative for adenopathy. Does not bruise/bleed easily.  Psychiatric/Behavioral: Negative for decreased concentration and dysphoric mood. The patient is not nervous/anxious.        Objective:   Physical Exam Constitutional:      General: She is not in acute distress.    Appearance: She is well-developed and normal weight. She is not ill-appearing or diaphoretic.  Comments: Frail appearing   HENT:     Head: Normocephalic and atraumatic.     Nose: Nose normal.     Mouth/Throat:     Mouth: Mucous membranes are moist.      Pharynx: Oropharynx is clear. No posterior oropharyngeal erythema.  Eyes:     General: No scleral icterus.    Conjunctiva/sclera: Conjunctivae normal.     Pupils: Pupils are equal, round, and reactive to light.  Neck:     Musculoskeletal: Normal range of motion and neck supple.     Thyroid: No thyromegaly.     Vascular: No carotid bruit or JVD.  Cardiovascular:     Rate and Rhythm: Normal rate and regular rhythm.     Pulses: Normal pulses.     Heart sounds: Normal heart sounds. No gallop.   Pulmonary:     Effort: Pulmonary effort is normal. No respiratory distress.     Breath sounds: Normal breath sounds. No wheezing or rales.  Abdominal:     General: Bowel sounds are normal. There is no distension or abdominal bruit.     Palpations: Abdomen is soft. There is no mass.     Tenderness: There is no abdominal tenderness. There is no rebound.     Comments: Lap incisions healing well    Musculoskeletal:        General: No deformity.     Right lower leg: Edema present.     Left lower leg: Edema present.     Comments: Trace pedal edema worse on L  Neg homan's sign  No redness or palp cord  Lymphadenopathy:     Cervical: No cervical adenopathy.  Skin:    General: Skin is warm.     Capillary Refill: Capillary refill takes less than 2 seconds.     Coloration: Skin is not jaundiced or pale.     Findings: No erythema or rash.  Neurological:     General: No focal deficit present.     Mental Status: She is alert.     Deep Tendon Reflexes: Reflexes are normal and symmetric.  Psychiatric:        Mood and Affect: Mood normal.           Assessment & Plan:   Problem List Items Addressed This Visit      Cardiovascular and Mediastinum   Essential hypertension    Reviewed hospital records, lab results and studies in detail  Recently stopped hctz (she stopped losartan) due to sepsis/low bp and renal insuff in hospital  BP: (!) 146/78 bp is up as expected  Lab today and then advise  re: re starting          Relevant Orders   CBC with Differential/Platelet (Completed)     Digestive   Fatty liver   Relevant Orders   Hepatic function panel (Completed)   Cholecystitis    With sepsis upon hosp admit  Reviewed hospital records, lab results and studies in detail  Improved then resolved with ccy and abx Finished home amox To f/u with gen surg-healing well  Appetite is returning         Endocrine   Type II diabetes mellitus with renal manifestations ([place])    Last A1C 7.7 Sees endocrinology  Was controlled in hospital with SSI and amaryl held temporarily  Watching glucose levels at home         Genitourinary   Acute renal failure superimposed on stage 3 chronic kidney disease ([place]) - Primary  With 1 kidney  Reviewed hospital records, lab results and studies in detail   Cr climbed to 1.24 with dehydration and sepsis then improved  hctz held  Re check labs today  For renal f/u upcoming       Relevant Orders   Renal function panel (Completed)     Other   HYPERCHOLESTEROLEMIA    Can resume statin if her LFTs are improved from hosp s/p ccy      Elevated transaminase level    Due to cholecystitis  Much clinical improvement  Re check today  Reviewed hospital records, lab results and studies in detail        Relevant Orders   Hepatic function panel (Completed)   Pedal edema    Some worse since hosp/fluids and also holding hctz  Using supp hose at home  Labs today  No pain       RESOLVED: Enterococcal bacteremia    Resolved with zosyn  Did not affect heart valves  Transitioned to amoxicillin  Finished that now -clinically resolved

## 2018-06-22 NOTE — Assessment & Plan Note (Signed)
With 1 kidney  Reviewed hospital records, lab results and studies in detail   Cr climbed to 1.24 with dehydration and sepsis then improved  hctz held  Re check labs today  For renal f/u upcoming

## 2018-06-22 NOTE — Assessment & Plan Note (Signed)
Due to cholecystitis  Much clinical improvement  Re check today  Reviewed hospital records, lab results and studies in detail

## 2018-06-22 NOTE — Assessment & Plan Note (Signed)
Some worse since hosp/fluids and also holding hctz  Using supp hose at home  Labs today  No pain

## 2018-06-22 NOTE — Assessment & Plan Note (Signed)
Can resume statin if her LFTs are improved from hosp s/p ccy

## 2018-06-22 NOTE — Assessment & Plan Note (Signed)
Last A1C 7.7 Sees endocrinology  Was controlled in hospital with SSI and amaryl held temporarily  Watching glucose levels at home

## 2018-06-22 NOTE — Assessment & Plan Note (Signed)
With sepsis upon hosp admit  Reviewed hospital records, lab results and studies in detail  Improved then resolved with ccy and abx Finished home amox To f/u with gen surg-healing well  Appetite is returning

## 2018-06-22 NOTE — Assessment & Plan Note (Signed)
Resolved with zosyn  Did not affect heart valves  Transitioned to amoxicillin  Finished that now -clinically resolved

## 2018-06-22 NOTE — Assessment & Plan Note (Signed)
Reviewed hospital records, lab results and studies in detail  Recently stopped hctz (she stopped losartan) due to sepsis/low bp and renal insuff in hospital  BP: (!) 146/78 bp is up as expected  Lab today and then advise re: re starting

## 2018-06-22 NOTE — Patient Instructions (Signed)
If you want physical therapy please let us know  Keep eating and drinking  Watch your blood sugar levels  Labs today  With results we will know what to do about your blood pressure  If any symptoms return please let us know

## 2018-06-26 NOTE — Telephone Encounter (Signed)
Patient seen by Dr. [name].

## 2018-06-29 ENCOUNTER — Encounter: Payer: Self-pay | Admitting: Family Medicine

## 2018-06-29 DIAGNOSIS — R300 Dysuria: Secondary | ICD-10-CM | POA: Insufficient documentation

## 2018-06-29 DIAGNOSIS — R29898 Other symptoms and signs involving the musculoskeletal system: Secondary | ICD-10-CM | POA: Insufficient documentation

## 2018-06-29 NOTE — Telephone Encounter (Signed)
Please let pt know I ordered PT and the office will call her  Also please order UA for Tuesday for dysuria  Thanks  Sent to [place] and [facility] and cc [facility]

## 2018-06-30 ENCOUNTER — Other Ambulatory Visit (INDEPENDENT_AMBULATORY_CARE_PROVIDER_SITE_OTHER): Payer: Medicare HMO

## 2018-06-30 DIAGNOSIS — I10 Essential (primary) hypertension: Secondary | ICD-10-CM

## 2018-06-30 DIAGNOSIS — R8290 Unspecified abnormal findings in urine: Secondary | ICD-10-CM

## 2018-06-30 DIAGNOSIS — E7800 Pure hypercholesterolemia, unspecified: Secondary | ICD-10-CM | POA: Diagnosis not present

## 2018-06-30 DIAGNOSIS — N289 Disorder of kidney and ureter, unspecified: Secondary | ICD-10-CM

## 2018-06-30 LAB — CBC WITH DIFFERENTIAL/PLATELET
Basophils Absolute: 0 10*3/uL (ref 0.0–0.1)
Basophils Relative: 0.7 % (ref 0.0–3.0)
EOS ABS: 0.5 10*3/uL (ref 0.0–0.7)
Eosinophils Relative: 8.4 % — ABNORMAL HIGH (ref 0.0–5.0)
HEMATOCRIT: 34 % — AB (ref 36.0–46.0)
Hemoglobin: 10.9 g/dL — ABNORMAL LOW (ref 12.0–15.0)
Lymphocytes Relative: 41 % (ref 12.0–46.0)
Lymphs Abs: 2.4 10*3/uL (ref 0.7–4.0)
MCHC: 32 g/dL (ref 30.0–36.0)
MCV: 96.5 fl (ref 78.0–100.0)
Monocytes Absolute: 0.4 10*3/uL (ref 0.1–1.0)
Monocytes Relative: 7.2 % (ref 3.0–12.0)
Neutro Abs: 2.5 10*3/uL (ref 1.4–7.7)
Neutrophils Relative %: 42.7 % — ABNORMAL LOW (ref 43.0–77.0)
Platelets: 269 10*3/uL (ref 150.0–400.0)
RBC: 3.52 Mil/uL — ABNORMAL LOW (ref 3.87–5.11)
RDW: 14.2 % (ref 11.5–15.5)
WBC: 5.8 10*3/uL (ref 4.0–10.5)

## 2018-06-30 LAB — POC URINALSYSI DIPSTICK (AUTOMATED)
Bilirubin, UA: NEGATIVE
Blood, UA: NEGATIVE
Glucose, UA: NEGATIVE
Ketones, UA: NEGATIVE
Nitrite, UA: NEGATIVE
Protein, UA: POSITIVE — AB
Spec Grav, UA: 1.015 (ref 1.010–1.025)
Urobilinogen, UA: 0.2 E.U./dL
pH, UA: 6 (ref 5.0–8.0)

## 2018-06-30 LAB — COMPREHENSIVE METABOLIC PANEL
ALT: 13 U/L (ref 0–35)
AST: 23 U/L (ref 0–37)
Albumin: 3.4 g/dL — ABNORMAL LOW (ref 3.5–5.2)
Alkaline Phosphatase: 131 U/L — ABNORMAL HIGH (ref 39–117)
BUN: 10 mg/dL (ref 6–23)
CO2: 31 mEq/L (ref 19–32)
Calcium: 8.9 mg/dL (ref 8.4–10.5)
Chloride: 107 mEq/L (ref 96–112)
Creatinine, Ser: 0.9 mg/dL (ref 0.40–1.20)
GFR: 60.01 mL/min (ref >60.00)
Glucose, Bld: 146 mg/dL — ABNORMAL HIGH (ref 70–99)
Potassium: 5 mEq/L (ref 3.5–5.1)
Sodium: 145 mEq/L (ref 135–145)
Total Bilirubin: 1 mg/dL (ref 0.2–1.2)
Total Protein: 6 g/dL (ref 6.0–8.3)

## 2018-06-30 LAB — LIPID PANEL
CHOLESTEROL: 118 mg/dL (ref 0–200)
HDL: 31.2 mg/dL — ABNORMAL LOW (ref >39.00)
LDL Cholesterol: 61 mg/dL (ref 0–99)
NonHDL: 86.63
Total CHOL/HDL Ratio: 4
Triglycerides: 126 mg/dL (ref 0.0–149.0)
VLDL: 25.2 mg/dL (ref 0.0–40.0)

## 2018-06-30 LAB — TSH: TSH: 2.67 u[IU]/mL (ref 0.35–4.50)

## 2018-06-30 NOTE — Addendum Note (Signed)
Addended by: Tammi Sou on: 06/30/2018 12:32 PM   Modules accepted: Orders

## 2018-07-01 LAB — URINE CULTURE
MICRO NUMBER: 115594
SPECIMEN QUALITY:: ADEQUATE

## 2018-07-03 DIAGNOSIS — R69 Illness, unspecified: Secondary | ICD-10-CM | POA: Diagnosis not present

## 2018-07-08 DIAGNOSIS — L821 Other seborrheic keratosis: Secondary | ICD-10-CM | POA: Diagnosis not present

## 2018-07-08 DIAGNOSIS — L728 Other follicular cysts of the skin and subcutaneous tissue: Secondary | ICD-10-CM | POA: Diagnosis not present

## 2018-07-08 DIAGNOSIS — Z85828 Personal history of other malignant neoplasm of skin: Secondary | ICD-10-CM | POA: Diagnosis not present

## 2018-07-08 DIAGNOSIS — L508 Other urticaria: Secondary | ICD-10-CM | POA: Diagnosis not present

## 2018-07-08 DIAGNOSIS — B372 Candidiasis of skin and nail: Secondary | ICD-10-CM | POA: Diagnosis not present

## 2018-07-08 DIAGNOSIS — Z08 Encounter for follow-up examination after completed treatment for malignant neoplasm: Secondary | ICD-10-CM | POA: Diagnosis not present

## 2018-07-08 DIAGNOSIS — L738 Other specified follicular disorders: Secondary | ICD-10-CM | POA: Diagnosis not present

## 2018-07-09 DIAGNOSIS — R262 Difficulty in walking, not elsewhere classified: Secondary | ICD-10-CM | POA: Diagnosis not present

## 2018-07-09 DIAGNOSIS — M6281 Muscle weakness (generalized): Secondary | ICD-10-CM | POA: Diagnosis not present

## 2018-07-13 DIAGNOSIS — M6281 Muscle weakness (generalized): Secondary | ICD-10-CM | POA: Diagnosis not present

## 2018-07-13 DIAGNOSIS — R262 Difficulty in walking, not elsewhere classified: Secondary | ICD-10-CM | POA: Diagnosis not present

## 2018-07-20 DIAGNOSIS — M6281 Muscle weakness (generalized): Secondary | ICD-10-CM | POA: Diagnosis not present

## 2018-07-20 DIAGNOSIS — R262 Difficulty in walking, not elsewhere classified: Secondary | ICD-10-CM | POA: Diagnosis not present

## 2018-07-21 ENCOUNTER — Telehealth: Payer: Self-pay | Admitting: Family Medicine

## 2018-07-21 ENCOUNTER — Ambulatory Visit
Admission: RE | Admit: 2018-07-21 | Discharge: 2018-07-21 | Disposition: A | Discharge status: Home / Self Care | Payer: Medicare HMO | Source: Ambulatory Visit | Attending: Family Medicine | Admitting: Family Medicine

## 2018-07-21 DIAGNOSIS — E2839 Other primary ovarian failure: Secondary | ICD-10-CM | POA: Insufficient documentation

## 2018-07-21 DIAGNOSIS — M85831 Other specified disorders of bone density and structure, right forearm: Secondary | ICD-10-CM | POA: Diagnosis not present

## 2018-07-21 NOTE — Telephone Encounter (Signed)
Patient called asking about her referral to Nephrology. Patient hasn't been to a Nephrologist before.  Patient can go to [facility] or [place]. Patient can go anytime.

## 2018-07-22 NOTE — Telephone Encounter (Signed)
Nephrology Referral was sent 1 month ago, called patient and [facility]. Hinton Dyer will call the patient to schedule, patient notified.

## 2018-07-24 DIAGNOSIS — Z95810 Presence of automatic (implantable) cardiac defibrillator: Secondary | ICD-10-CM | POA: Diagnosis not present

## 2018-07-24 DIAGNOSIS — Z4502 Encounter for adjustment and management of automatic implantable cardiac defibrillator: Secondary | ICD-10-CM | POA: Diagnosis not present

## 2018-07-27 DIAGNOSIS — R262 Difficulty in walking, not elsewhere classified: Secondary | ICD-10-CM | POA: Diagnosis not present

## 2018-07-27 DIAGNOSIS — M6281 Muscle weakness (generalized): Secondary | ICD-10-CM | POA: Diagnosis not present

## 2018-08-03 DIAGNOSIS — M6281 Muscle weakness (generalized): Secondary | ICD-10-CM | POA: Diagnosis not present

## 2018-08-03 DIAGNOSIS — R262 Difficulty in walking, not elsewhere classified: Secondary | ICD-10-CM | POA: Diagnosis not present

## 2018-08-10 DIAGNOSIS — M6281 Muscle weakness (generalized): Secondary | ICD-10-CM | POA: Diagnosis not present

## 2018-08-10 DIAGNOSIS — R262 Difficulty in walking, not elsewhere classified: Secondary | ICD-10-CM | POA: Diagnosis not present

## 2018-08-28 DIAGNOSIS — R69 Illness, unspecified: Secondary | ICD-10-CM | POA: Diagnosis not present

## 2018-09-07 ENCOUNTER — Other Ambulatory Visit: Payer: Self-pay | Admitting: Family Medicine

## 2018-09-08 NOTE — Telephone Encounter (Signed)
Name of Medication: tramadol Name of Pharmacy: [facility] or Written Date and Quantity: 03/09/18 #60 tabs with 0 refills Last Office Visit and Type:  Hospital F/U Next Office Visit and Type: CPE/AWV 06/15/19

## 2018-09-24 DIAGNOSIS — E1129 Type 2 diabetes mellitus with other diabetic kidney complication: Secondary | ICD-10-CM | POA: Diagnosis not present

## 2018-09-24 DIAGNOSIS — R809 Proteinuria, unspecified: Secondary | ICD-10-CM | POA: Diagnosis not present

## 2018-10-01 DIAGNOSIS — L659 Nonscarring hair loss, unspecified: Secondary | ICD-10-CM | POA: Diagnosis not present

## 2018-10-01 DIAGNOSIS — L508 Other urticaria: Secondary | ICD-10-CM | POA: Diagnosis not present

## 2018-10-02 DIAGNOSIS — R809 Proteinuria, unspecified: Secondary | ICD-10-CM | POA: Diagnosis not present

## 2018-10-02 DIAGNOSIS — E1129 Type 2 diabetes mellitus with other diabetic kidney complication: Secondary | ICD-10-CM | POA: Diagnosis not present

## 2018-10-02 DIAGNOSIS — L659 Nonscarring hair loss, unspecified: Secondary | ICD-10-CM | POA: Diagnosis not present

## 2018-10-06 DIAGNOSIS — C50311 Malignant neoplasm of lower-inner quadrant of right female breast: Secondary | ICD-10-CM | POA: Diagnosis not present

## 2018-10-06 DIAGNOSIS — Z170 Estrogen receptor positive status [ER+]: Secondary | ICD-10-CM | POA: Diagnosis not present

## 2018-10-07 DIAGNOSIS — E1122 Type 2 diabetes mellitus with diabetic chronic kidney disease: Secondary | ICD-10-CM | POA: Diagnosis not present

## 2018-10-07 DIAGNOSIS — R809 Proteinuria, unspecified: Secondary | ICD-10-CM | POA: Diagnosis not present

## 2018-10-07 DIAGNOSIS — E782 Mixed hyperlipidemia: Secondary | ICD-10-CM | POA: Diagnosis not present

## 2018-10-07 DIAGNOSIS — N183 Chronic kidney disease, stage 3 (moderate): Secondary | ICD-10-CM | POA: Diagnosis not present

## 2018-10-07 DIAGNOSIS — E1129 Type 2 diabetes mellitus with other diabetic kidney complication: Secondary | ICD-10-CM | POA: Diagnosis not present

## 2018-10-23 DIAGNOSIS — Z4502 Encounter for adjustment and management of automatic implantable cardiac defibrillator: Secondary | ICD-10-CM | POA: Diagnosis not present

## 2018-10-23 DIAGNOSIS — Z95810 Presence of automatic (implantable) cardiac defibrillator: Secondary | ICD-10-CM | POA: Diagnosis not present

## 2018-10-28 DIAGNOSIS — E1122 Type 2 diabetes mellitus with diabetic chronic kidney disease: Secondary | ICD-10-CM | POA: Diagnosis not present

## 2018-10-28 DIAGNOSIS — N183 Chronic kidney disease, stage 3 (moderate): Secondary | ICD-10-CM | POA: Diagnosis not present

## 2018-10-28 DIAGNOSIS — K219 Gastro-esophageal reflux disease without esophagitis: Secondary | ICD-10-CM | POA: Diagnosis not present

## 2018-10-28 DIAGNOSIS — D649 Anemia, unspecified: Secondary | ICD-10-CM | POA: Diagnosis not present

## 2018-10-28 DIAGNOSIS — N179 Acute kidney failure, unspecified: Secondary | ICD-10-CM | POA: Diagnosis not present

## 2018-10-28 DIAGNOSIS — Q600 Renal agenesis, unilateral: Secondary | ICD-10-CM | POA: Diagnosis not present

## 2018-10-28 DIAGNOSIS — I129 Hypertensive chronic kidney disease with stage 1 through stage 4 chronic kidney disease, or unspecified chronic kidney disease: Secondary | ICD-10-CM | POA: Diagnosis not present

## 2018-11-03 LAB — HM MAMMOGRAPHY: HM Mammogram: NORMAL (ref 0–4)

## 2018-11-06 ENCOUNTER — Telehealth: Payer: Self-pay

## 2018-11-06 DIAGNOSIS — L659 Nonscarring hair loss, unspecified: Secondary | ICD-10-CM | POA: Diagnosis not present

## 2018-11-06 NOTE — Telephone Encounter (Signed)
Copied from [place] [id number]. Topic: Appointment Scheduling - Scheduling Inquiry for Clinic >> Nov 05, 2018  4:07 PM Morgan Hubbard wrote: Reason for CRM: Pt called to schedule an appt to clean out her ears. Please advise.  [phone]

## 2018-11-06 NOTE — Telephone Encounter (Signed)
I left a detailed message on patient's voice mail to call back and schedule appointment. °

## 2018-11-09 ENCOUNTER — Encounter: Payer: Self-pay | Admitting: Family Medicine

## 2018-11-09 ENCOUNTER — Other Ambulatory Visit: Payer: Self-pay

## 2018-11-09 ENCOUNTER — Ambulatory Visit (INDEPENDENT_AMBULATORY_CARE_PROVIDER_SITE_OTHER): Payer: Medicare HMO | Admitting: Family Medicine

## 2018-11-09 VITALS — BP 130/74 | HR 67 | Temp 98.4°F | Ht 61.0 in | Wt 146.0 lb

## 2018-11-09 DIAGNOSIS — H6121 Impacted cerumen, right ear: Secondary | ICD-10-CM

## 2018-11-09 DIAGNOSIS — L659 Nonscarring hair loss, unspecified: Secondary | ICD-10-CM | POA: Insufficient documentation

## 2018-11-09 DIAGNOSIS — Z170 Estrogen receptor positive status [ER+]: Secondary | ICD-10-CM | POA: Diagnosis not present

## 2018-11-09 DIAGNOSIS — N6459 Other signs and symptoms in breast: Secondary | ICD-10-CM | POA: Diagnosis not present

## 2018-11-09 DIAGNOSIS — C50311 Malignant neoplasm of lower-inner quadrant of right female breast: Secondary | ICD-10-CM | POA: Diagnosis not present

## 2018-11-09 NOTE — Assessment & Plan Note (Signed)
Pt saw dermatology and had labs Supplementing iron  (? If stores were low)  Suspect Letrozole is a bigger factor

## 2018-11-09 NOTE — Assessment & Plan Note (Signed)
R side only (recurrent) Cleared completely with simple irrigation today  Hearing improved Will update if any pain/dizziness or other problems

## 2018-11-09 NOTE — Patient Instructions (Signed)
Your left ear is clear We flushed the right ear today and now it looks clear (the canal and ear drum look normal)   Let us know if you feel dizzy or if you have ear pain   Take care of yourself

## 2018-11-09 NOTE — Progress Notes (Signed)
Subjective:    Patient ID: Morgan Hubbard, female    DOB: Nov 28, 1934, 84 y.o.   MRN: [id number]  HPI Here for cerumen impaction  R one is full and uncomfortable  Cannot hear well out of it  She has some hair loss and her dermatologist found iron def She is taking iron for this  Did some other blood work also  She takes letrozole  -this could also add to it   Otherwise doing well   Patient Active Problem List   Diagnosis Date Noted  . Leg weakness, bilateral 06/29/2018  . Dysuria 06/29/2018  . Pedal edema 06/22/2018  . AICD (automatic cardioverter/defibrillator) present 06/11/2018  . Abdominal pain 06/10/2018  . Abnormal LFTs 06/10/2018  . GERD (gastroesophageal reflux disease) 06/10/2018  . Acute renal failure superimposed on stage 3 chronic kidney disease ([place]) 06/10/2018  . Cholecystitis 06/10/2018  . Tingling 10/06/2017  . Chronic back pain 05/23/2017  . Chronic systolic CHF (congestive heart failure) ([place]) 02/25/2017  . Osteopenia 06/16/2016  . Routine general medical examination at a health care facility 04/24/2016  . Estrogen deficiency 04/24/2016  . Urticaria 11/03/2015  . Cerumen impaction 03/10/2015  . Encounter for Medicare annual wellness exam 06/13/2014  . Stroke, small vessel ([place]) 01/18/2014  . Family history of hemochromatosis 01/18/2014  . Cerebral thrombosis with cerebral infarction ([place]) 01/14/2014  . Expressive aphasia 01/13/2014  . Nonischemic cardiomyopathy ([place]) 04/05/2013  . Shortness of breath 09/03/2012  . Elevated transaminase level 05/18/2012  . Spinal stenosis of lumbar region 12/10/2011  . Mixed incontinence urge and stress 12/10/2011  . Renal insufficiency 11/04/2011  . Goiter 01/14/2011  . Fatty liver 08/28/2010  . Type II diabetes mellitus with renal manifestations ([place]) 07/09/2010  . HYPERCHOLESTEROLEMIA 07/09/2010  . HYPERKALEMIA 07/09/2010  . REFLEX SYMPATHETIC DYSTROPHY 07/09/2010  . Neuropathy 07/09/2010  . Essential  hypertension 07/09/2010  . CARDIOMYOPATHY 07/09/2010  . OVERACTIVE BLADDER 07/09/2010   Past Medical History:  Diagnosis Date  . Arthritis    "hands" (01/13/2014)  . Basal cell carcinoma 01/2014   "bridge of nose"  . Cardiac LV ejection fraction >40%    "it was 43 last year" (01/13/2014)  . Chronic lower back pain   . CKD (chronic kidney disease), stage III (HCC)    acute on chronic stage III/notes 06/10/2018  . Colon polyps   . Expressive aphasia    "3 times in the last week" (01/13/2014)  . GERD (gastroesophageal reflux disease)   . Goiter past remote   treated with RI  . Hyperlipidemia   . Hyperpotassemia   . Hypertension   . Hypertonicity of bladder   . Inflammatory and toxic neuropathy, unspecified   . LBBB (left bundle branch block)   . Migraine    "stopped many years ago" (01/13/2014)  . Mini stroke ([place]) 01/2014  . Other primary cardiomyopathies   . Overactive bladder   . Presence of combination internal cardiac defibrillator (ICD) and pacemaker   . Reflex sympathetic dystrophy, unspecified   . Shingles   . Shortness of breath    ambulation  . Stroke ([place])   . Thyroid disease   . Type II diabetes mellitus ([place])   . Ulcer   . Urine incontinence   . Vertigo    hx of   Past Surgical History:  Procedure Laterality Date  . BACK SURGERY    . BI-VENTRICULAR PACEMAKER INSERTION (CRT-P)  10/2014   [facility]  . BREAST CYST EXCISION Left 1959  . CARDIAC CATHETERIZATION  "  several"   [place]  . CATARACT EXTRACTION W/ INTRAOCULAR LENS IMPLANT Left ~ 2005   several eye injections  . CHOLECYSTECTOMY N/A 06/13/2018   Procedure: LAPAROSCOPIC CHOLECYSTECTOMY;  Surgeon: Donnie Mesa, MD;  Location: [facility];  Service: General;  Laterality: N/A;  . COLONOSCOPY  7/12   normal (hx of polyps in past)   . CT SCAN  3/12   outside hosp- lung nodule and gallstones  . ERCP N/A 06/12/2018   Procedure: ENDOSCOPIC RETROGRADE CHOLANGIOPANCREATOGRAPHY (ERCP);  Surgeon: Carol Ada, MD;   Location: [facility];  Service: Endoscopy;  Laterality: N/A;  . [place]   "knot on index"  . KIDNEY DONATION Left 1989  . LUMBAR LAMINECTOMY/DECOMPRESSION MICRODISCECTOMY  1999  . LUMBAR LAMINECTOMY/DECOMPRESSION MICRODISCECTOMY  01/31/2012   Procedure: LUMBAR LAMINECTOMY/DECOMPRESSION MICRODISCECTOMY 2 LEVELS;  Surgeon: Eustace Moore, MD;  Location: [facility];  Service: Neurosurgery;  Laterality: N/A;  Thoracic twelve-lumbar one, lumbar one-two laminectomy   . POSTERIOR LAMINECTOMY / DECOMPRESSION CERVICAL SPINE  1999  . REMOVAL OF STONES  06/12/2018   Procedure: REMOVAL OF STONES;  Surgeon: Carol Ada, MD;  Location: [facility] ENDOSCOPY;  Service: Endoscopy;;  . Joan Mayans  06/12/2018   Procedure: Joan Mayans;  Surgeon: Carol Ada, MD;  Location: [facility] ENDOSCOPY;  Service: Endoscopy;;  . [place]  . UPPER GASTROINTESTINAL ENDOSCOPY     Social History   Tobacco Use  . Smoking status: Never Smoker  . Smokeless tobacco: Never Used  Substance Use Topics  . Alcohol use: No    Alcohol/week: 0.0 standard drinks  . Drug use: No   Family History  Problem Relation Age of Onset  . Arthritis Mother   . Cancer Mother        uterine and mouth  . Hyperlipidemia Mother   . Stroke Mother   . Hypertension Mother   . Alcohol abuse Father   . Diabetes Father   . Cancer Sister        breast  . Diabetes Sister   . Cancer Brother        lung cancer  . Kidney disease Brother   . Diabetes Brother   . Hyperlipidemia Sister   . Heart disease Sister   . Hypertension Sister   . Diabetes Sister   . Hyperlipidemia Brother   . Kidney failure Brother   . Diabetes Daughter   . Addison's disease Other    Allergies  Allergen Reactions  . Ace Inhibitors Swelling    REACTION: tongue swelling  . Codeine Other (See Comments)    REACTION: nausea  . Hydrochlorothiazide Other (See Comments)    unknown  . Insulin Detemir Itching  . Lisinopril Swelling     Swelling of tongue  . Nsaids Other (See Comments)    Unable to take D/T kidney issues  . Tylenol [Acetaminophen] Other (See Comments)    Pt has one kidney and says she can't take it   Current Outpatient Medications on File Prior to Visit  Medication Sig Dispense Refill  . aspirin 325 MG tablet Take 325 mg by mouth daily.    Marland Kitchen atorvastatin (LIPITOR) 80 MG tablet Take 80 mg by mouth daily.    . Biotin w/ Vitamins C & E (HAIR/SKIN/NAILS PO) Take by mouth.    . carvedilol (COREG) 12.5 MG tablet Take 1 tablet (12.5 mg total) by mouth 2 (two) times daily with a meal. 180 tablet 4  . cetirizine (ZYRTEC) 10 MG tablet Take 10 mg by  mouth 2 (two) times daily.    . clotrimazole-betamethasone (LOTRISONE) cream Apply 1 application topically as needed (groin).     . dapsone 25 MG tablet Take 25 mg by mouth daily.     . Ferrous Sulfate (IRON PO) Take 1 tablet by mouth 2 (two) times a day. 200mg  tab    . gabapentin (NEURONTIN) 300 MG capsule Take 1 capsule (300 mg total) by mouth 3 (three) times daily. 270 capsule 3  . glimepiride (AMARYL) 2 MG tablet Take 1 tablet by mouth daily.    Marland Kitchen glucose blood (ONE TOUCH ULTRA TEST) test strip Use 3 (three) times daily.    . hydrALAZINE (APRESOLINE) 50 MG tablet Take 1 tablet by mouth 2 (two) times daily.    . isosorbide mononitrate (IMDUR) 30 MG 24 hr tablet Take 1 tablet (30 mg total) by mouth daily. 90 tablet 4  . letrozole (FEMARA) 2.5 MG tablet Take 2.5 mg by mouth daily.    Marland Kitchen losartan (COZAAR) 50 MG tablet Take 50 mg by mouth daily.    . metFORMIN (GLUCOPHAGE-XR) 500 MG 24 hr tablet Take 1,000 mg by mouth 2 (two) times daily.    Marland Kitchen MILK THISTLE PO Take 1 capsule by mouth 2 (two) times daily.     . mirabegron ER ([name]) 50 MG TB24 tablet Take 50 mg by mouth daily.    . nitroGLYCERIN (NITROSTAT) 0.4 MG SL tablet Place 0.4 mg under the tongue every 5 (five) minutes as needed for chest pain.    . pantoprazole (PROTONIX) 40 MG tablet Take 1 tablet (40 mg total)  by mouth daily. 90 tablet 3  . predniSONE (DELTASONE) 10 MG tablet TAKE 1 TABLET BY MOUTH ONCE DAILY AS DIRECTED FOR FLARE UPS    . traMADol (ULTRAM) 50 MG tablet TAKE 1 TO 2 TABLETS BY MOUTH EVERY 8 HOURS AS NEEDED FOR  MODERATE  OR  SEVERE  PAIN 60 tablet 0  . vitamin B-12 (CYANOCOBALAMIN) 1000 MCG tablet Take 1,000 mcg by mouth daily.     . Vitamin D, Ergocalciferol, (DRISDOL) 1.25 MG (50000 UT) CAPS capsule Take 1 capsule by mouth once a week.     No current facility-administered medications on file prior to visit.      Review of Systems  Constitutional: Negative for activity change, appetite change, fatigue, fever and unexpected weight change.  HENT: Positive for ear pain and hearing loss. Negative for congestion, ear discharge, facial swelling, rhinorrhea, sinus pressure and sore throat.   Eyes: Negative for pain, redness and visual disturbance.  Respiratory: Negative for cough, shortness of breath and wheezing.   Cardiovascular: Negative for chest pain and palpitations.  Gastrointestinal: Negative for abdominal pain, blood in stool, constipation and diarrhea.  Endocrine: Negative for polydipsia and polyuria.  Genitourinary: Negative for dysuria, frequency and urgency.  Musculoskeletal: Negative for arthralgias, back pain and myalgias.  Skin: Negative for pallor and rash.       Hair loss recently  Allergic/Immunologic: Negative for environmental allergies.  Neurological: Negative for dizziness, syncope and headaches.  Hematological: Negative for adenopathy. Does not bruise/bleed easily.  Psychiatric/Behavioral: Negative for decreased concentration and dysphoric mood. The patient is not nervous/anxious.        Objective:   Physical Exam Constitutional:      General: She is not in acute distress.    Appearance: Normal appearance. She is normal weight. She is not ill-appearing or diaphoretic.  HENT:     Head: Normocephalic and atraumatic.     Right  Ear: There is impacted  cerumen.     Left Ear: Tympanic membrane, ear canal and external ear normal. There is no impacted cerumen.     Ears:     Comments: Cerumen impaction on R  Cleared completely with simple irrigation  Canal and TM after this look normal and hearing improved     Mouth/Throat:     Mouth: Mucous membranes are moist.     Pharynx: Oropharynx is clear. No oropharyngeal exudate or posterior oropharyngeal erythema.  Eyes:     General: No scleral icterus.       Right eye: No discharge.        Left eye: No discharge.     Extraocular Movements: Extraocular movements intact.     Conjunctiva/sclera: Conjunctivae normal.     Pupils: Pupils are equal, round, and reactive to light.  Neck:     Musculoskeletal: Neck supple.  Cardiovascular:     Rate and Rhythm: Normal rate and regular rhythm.     Pulses: Normal pulses.  Lymphadenopathy:     Cervical: No cervical adenopathy.  Skin:    General: Skin is warm and dry.     Findings: No erythema or rash.     Comments: Hair is generally thinning No focal alopecia noted  Neurological:     Mental Status: She is alert. Mental status is at baseline.     Cranial Nerves: No cranial nerve deficit.  Psychiatric:        Mood and Affect: Mood normal.     Comments: Pleasant            Assessment & Plan:   Problem List Items Addressed This Visit      Nervous and Auditory   Cerumen impaction - Primary    R side only (recurrent) Cleared completely with simple irrigation today  Hearing improved Will update if any pain/dizziness or other problems         Other   Hair loss    Pt saw dermatology and had labs Supplementing iron  (? If stores were low)  Suspect Letrozole is a bigger factor

## 2018-11-17 DIAGNOSIS — R69 Illness, unspecified: Secondary | ICD-10-CM | POA: Diagnosis not present

## 2018-12-07 ENCOUNTER — Other Ambulatory Visit: Payer: Self-pay | Admitting: Family Medicine

## 2018-12-08 NOTE — Telephone Encounter (Signed)
Name of Medication: tramadol Name of Pharmacy: [facility] or Written Date and Quantity: 09/08/2018 #60 tabs with 0 refills Last Office Visit and Type:  11/09/2018 acute Next Office Visit and Type: CPE/AWV 06/15/19

## 2019-01-03 LAB — HEMOGLOBIN A1C

## 2019-01-04 DIAGNOSIS — R69 Illness, unspecified: Secondary | ICD-10-CM | POA: Diagnosis not present

## 2019-01-12 DIAGNOSIS — D649 Anemia, unspecified: Secondary | ICD-10-CM | POA: Diagnosis not present

## 2019-01-12 DIAGNOSIS — L659 Nonscarring hair loss, unspecified: Secondary | ICD-10-CM | POA: Diagnosis not present

## 2019-01-12 DIAGNOSIS — R809 Proteinuria, unspecified: Secondary | ICD-10-CM | POA: Diagnosis not present

## 2019-01-12 DIAGNOSIS — E1129 Type 2 diabetes mellitus with other diabetic kidney complication: Secondary | ICD-10-CM | POA: Diagnosis not present

## 2019-01-19 DIAGNOSIS — E1129 Type 2 diabetes mellitus with other diabetic kidney complication: Secondary | ICD-10-CM | POA: Diagnosis not present

## 2019-01-19 DIAGNOSIS — N183 Chronic kidney disease, stage 3 (moderate): Secondary | ICD-10-CM | POA: Diagnosis not present

## 2019-01-19 DIAGNOSIS — E1122 Type 2 diabetes mellitus with diabetic chronic kidney disease: Secondary | ICD-10-CM | POA: Diagnosis not present

## 2019-01-19 DIAGNOSIS — R809 Proteinuria, unspecified: Secondary | ICD-10-CM | POA: Diagnosis not present

## 2019-01-19 DIAGNOSIS — E782 Mixed hyperlipidemia: Secondary | ICD-10-CM | POA: Diagnosis not present

## 2019-01-22 DIAGNOSIS — E119 Type 2 diabetes mellitus without complications: Secondary | ICD-10-CM | POA: Diagnosis not present

## 2019-01-22 DIAGNOSIS — H34832 Tributary (branch) retinal vein occlusion, left eye, with macular edema: Secondary | ICD-10-CM | POA: Diagnosis not present

## 2019-01-22 LAB — HM DIABETES EYE EXAM

## 2019-01-28 ENCOUNTER — Encounter: Payer: Self-pay | Admitting: Family Medicine

## 2019-01-29 DIAGNOSIS — I10 Essential (primary) hypertension: Secondary | ICD-10-CM | POA: Diagnosis not present

## 2019-01-29 DIAGNOSIS — I428 Other cardiomyopathies: Secondary | ICD-10-CM | POA: Diagnosis not present

## 2019-01-29 DIAGNOSIS — Z95810 Presence of automatic (implantable) cardiac defibrillator: Secondary | ICD-10-CM | POA: Diagnosis not present

## 2019-02-02 ENCOUNTER — Encounter: Payer: Self-pay | Admitting: Family Medicine

## 2019-02-02 ENCOUNTER — Ambulatory Visit (INDEPENDENT_AMBULATORY_CARE_PROVIDER_SITE_OTHER): Payer: Medicare HMO | Admitting: Family Medicine

## 2019-02-02 ENCOUNTER — Other Ambulatory Visit: Payer: Self-pay

## 2019-02-02 VITALS — BP 118/60 | HR 78 | Temp 97.9°F | Ht 61.0 in | Wt 147.1 lb

## 2019-02-02 DIAGNOSIS — I10 Essential (primary) hypertension: Secondary | ICD-10-CM

## 2019-02-02 DIAGNOSIS — R42 Dizziness and giddiness: Secondary | ICD-10-CM | POA: Diagnosis not present

## 2019-02-02 NOTE — Progress Notes (Signed)
Subjective:    Patient ID: Morgan Hubbard, female    DOB: November 26, 1934, 84 y.o.   MRN: [id number]  HPI Here for dizziness  2 weeks   She feels unbalanced  Does not feel like the vertigo she had in the past -not nauseated Worse when she turns her head, however  Afraid to fall  More dizzy to stand - gets up slowly  Feels better to close her eyes    No head injuries  No headaches  No tinnitus   She decided not to drive when dizzy     Wt Readings from Last 3 Encounters:  02/02/19 147 lb 2 oz (66.7 kg)  11/09/18 146 lb (66.2 kg)  06/22/18 146 lb 12 oz (66.6 kg)   27.80 kg/m    BP Readings from Last 3 Encounters:  02/02/19 118/60  11/09/18 130/74  06/22/18 (!) 146/78  now on losartan hct  Pulse Readings from Last 3 Encounters:  02/02/19 78  11/09/18 67  06/22/18 73    H/o CVA (small vessel) Also CHF-non ischemic cardiomyopathy  She takes asa daily    Had cardiology and EP specialist -doing well  Carotid study was ok   Eye exam was ok     DM 2 with neuropathy (takes gabapentin)  Sees dr Gabriel Carina    Last A1c was 6.1 Good report  She occasionally has low readings  -none under 70  Takes myrbetriq for years    Takes femara (h/o breast cancer)  Lab Results  Component Value Date   CREATININE 0.90 06/30/2018   BUN 10 06/30/2018   NA 145 06/30/2018   K 5.0 06/30/2018   CL 107 06/30/2018   CO2 31 06/30/2018   Lab Results  Component Value Date   ALT 13 06/30/2018   AST 23 06/30/2018   ALKPHOS 131 (H) 06/30/2018   BILITOT 1.0 06/30/2018    Lab Results  Component Value Date   WBC 5.8 06/30/2018   HGB 10.9 (L) 06/30/2018   HCT 34.0 (L) 06/30/2018   MCV 96.5 06/30/2018   PLT 269.0 06/30/2018     Patient Active Problem List   Diagnosis Date Noted  . Dizziness 02/02/2019  . Hair loss 11/09/2018  . Leg weakness, bilateral 06/29/2018  . Dysuria 06/29/2018  . Pedal edema 06/22/2018  . AICD (automatic cardioverter/defibrillator) present 06/11/2018  .  Abdominal pain 06/10/2018  . Abnormal LFTs 06/10/2018  . GERD (gastroesophageal reflux disease) 06/10/2018  . Acute renal failure superimposed on stage 3 chronic kidney disease ([place]) 06/10/2018  . Cholecystitis 06/10/2018  . Tingling 10/06/2017  . Chronic back pain 05/23/2017  . Chronic systolic CHF (congestive heart failure) ([place]) 02/25/2017  . Osteopenia 06/16/2016  . Routine general medical examination at a health care facility 04/24/2016  . Estrogen deficiency 04/24/2016  . Urticaria 11/03/2015  . Cerumen impaction 03/10/2015  . Encounter for Medicare annual wellness exam 06/13/2014  . Stroke, small vessel ([place]) 01/18/2014  . Family history of hemochromatosis 01/18/2014  . Cerebral thrombosis with cerebral infarction ([place]) 01/14/2014  . Expressive aphasia 01/13/2014  . Nonischemic cardiomyopathy ([place]) 04/05/2013  . Shortness of breath 09/03/2012  . Elevated transaminase level 05/18/2012  . Spinal stenosis of lumbar region 12/10/2011  . Mixed incontinence urge and stress 12/10/2011  . Renal insufficiency 11/04/2011  . Goiter 01/14/2011  . Fatty liver 08/28/2010  . Type II diabetes mellitus with renal manifestations ([place]) 07/09/2010  . HYPERCHOLESTEROLEMIA 07/09/2010  . HYPERKALEMIA 07/09/2010  . REFLEX SYMPATHETIC DYSTROPHY 07/09/2010  .  Neuropathy 07/09/2010  . Essential hypertension 07/09/2010  . CARDIOMYOPATHY 07/09/2010  . OVERACTIVE BLADDER 07/09/2010   Past Medical History:  Diagnosis Date  . Arthritis    "hands" (01/13/2014)  . Basal cell carcinoma 01/2014   "bridge of nose"  . Cardiac LV ejection fraction >40%    "it was 43 last year" (01/13/2014)  . Chronic lower back pain   . CKD (chronic kidney disease), stage III (HCC)    acute on chronic stage III/notes 06/10/2018  . Colon polyps   . Expressive aphasia    "3 times in the last week" (01/13/2014)  . GERD (gastroesophageal reflux disease)   . Goiter past remote   treated with RI  . Hyperlipidemia   .  Hyperpotassemia   . Hypertension   . Hypertonicity of bladder   . Inflammatory and toxic neuropathy, unspecified   . LBBB (left bundle branch block)   . Migraine    "stopped many years ago" (01/13/2014)  . Mini stroke ([place]) 01/2014  . Other primary cardiomyopathies   . Overactive bladder   . Presence of combination internal cardiac defibrillator (ICD) and pacemaker   . Reflex sympathetic dystrophy, unspecified   . Shingles   . Shortness of breath    ambulation  . Stroke ([place])   . Thyroid disease   . Type II diabetes mellitus ([place])   . Ulcer   . Urine incontinence   . Vertigo    hx of   Past Surgical History:  Procedure Laterality Date  . BACK SURGERY    . BI-VENTRICULAR PACEMAKER INSERTION (CRT-P)  10/2014   [facility]  . BREAST CYST EXCISION Left 1959  . CARDIAC CATHETERIZATION  "several"   [place]  . CATARACT EXTRACTION W/ INTRAOCULAR LENS IMPLANT Left ~ 2005   several eye injections  . CHOLECYSTECTOMY N/A 06/13/2018   Procedure: LAPAROSCOPIC CHOLECYSTECTOMY;  Surgeon: Donnie Mesa, MD;  Location: [facility];  Service: General;  Laterality: N/A;  . COLONOSCOPY  7/12   normal (hx of polyps in past)   . CT SCAN  3/12   outside hosp- lung nodule and gallstones  . ERCP N/A 06/12/2018   Procedure: ENDOSCOPIC RETROGRADE CHOLANGIOPANCREATOGRAPHY (ERCP);  Surgeon: Carol Ada, MD;  Location: [facility];  Service: Endoscopy;  Laterality: N/A;  . [place]   "knot on index"  . KIDNEY DONATION Left 1989  . LUMBAR LAMINECTOMY/DECOMPRESSION MICRODISCECTOMY  1999  . LUMBAR LAMINECTOMY/DECOMPRESSION MICRODISCECTOMY  01/31/2012   Procedure: LUMBAR LAMINECTOMY/DECOMPRESSION MICRODISCECTOMY 2 LEVELS;  Surgeon: Eustace Moore, MD;  Location: [facility]URO ORS;  Service: Neurosurgery;  Laterality: N/A;  Thoracic twelve-lumbar one, lumbar one-two laminectomy   . POSTERIOR LAMINECTOMY / DECOMPRESSION CERVICAL SPINE  1999  . REMOVAL OF STONES  06/12/2018   Procedure: REMOVAL OF  STONES;  Surgeon: Carol Ada, MD;  Location[facility] ENDOSCOPY;  Service: Endoscopy;;  . Joan Mayans  06/12/2018   Procedure: Joan Mayans;  Surgeon: Carol Ada, MD;  [facility] ENDOSCOPY;  Service: Endoscopy;;  . [place]  . UPPER GASTROINTESTINAL ENDOSCOPY     Social History   Tobacco Use  . Smoking status: Never Smoker  . Smokeless tobacco: Never Used  Substance Use Topics  . Alcohol use: No    Alcohol/week: 0.0 standard drinks  . Drug use: No   Family History  Problem Relation Age of Onset  . Arthritis Mother   . Cancer Mother        uterine and mouth  . Hyperlipidemia Mother   .  Stroke Mother   . Hypertension Mother   . Alcohol abuse Father   . Diabetes Father   . Cancer Sister        breast  . Diabetes Sister   . Cancer Brother        lung cancer  . Kidney disease Brother   . Diabetes Brother   . Hyperlipidemia Sister   . Heart disease Sister   . Hypertension Sister   . Diabetes Sister   . Hyperlipidemia Brother   . Kidney failure Brother   . Diabetes Daughter   . Addison's disease Other    Allergies  Allergen Reactions  . Ace Inhibitors Swelling    REACTION: tongue swelling  . Codeine Other (See Comments)    REACTION: nausea  . Hydrochlorothiazide Other (See Comments)    unknown  . Insulin Detemir Itching  . Lisinopril Swelling    Swelling of tongue  . Nsaids Other (See Comments)    Unable to take D/T kidney issues  . Tylenol [Acetaminophen] Other (See Comments)    Pt has one kidney and says she can't take it   Current Outpatient Medications on File Prior to Visit  Medication Sig Dispense Refill  . aspirin 325 MG tablet Take 325 mg by mouth daily.    Marland Kitchen atorvastatin (LIPITOR) 80 MG tablet Take 80 mg by mouth daily.    . Biotin w/ Vitamins C & E (HAIR/SKIN/NAILS PO) Take by mouth.    . carvedilol (COREG) 12.5 MG tablet Take 1 tablet (12.5 mg total) by mouth 2 (two) times daily with a meal. 180 tablet 4  . cetirizine (ZYRTEC) 10  MG tablet Take 10 mg by mouth 2 (two) times daily.    . clotrimazole-betamethasone (LOTRISONE) cream Apply 1 application topically as needed (groin).     . dapsone 25 MG tablet Take 25 mg by mouth daily.     . Ferrous Sulfate (IRON PO) Take 1 tablet by mouth 2 (two) times a day. 200mg  tab    . gabapentin (NEURONTIN) 300 MG capsule Take 1 capsule (300 mg total) by mouth 3 (three) times daily. 270 capsule 3  . glimepiride (AMARYL) 2 MG tablet Take 1 tablet by mouth daily.    Marland Kitchen glucose blood (ONE TOUCH ULTRA TEST) test strip Use 3 (three) times daily.    . hydrALAZINE (APRESOLINE) 50 MG tablet Take 1 tablet by mouth 2 (two) times daily.    . isosorbide mononitrate (IMDUR) 30 MG 24 hr tablet Take 1 tablet (30 mg total) by mouth daily. 90 tablet 4  . letrozole (FEMARA) 2.5 MG tablet Take 2.5 mg by mouth daily.    Marland Kitchen losartan (COZAAR) 50 MG tablet Take 50 mg by mouth daily.    . metFORMIN (GLUCOPHAGE-XR) 500 MG 24 hr tablet Take 1,000 mg by mouth 2 (two) times daily.    Marland Kitchen MILK THISTLE PO Take 1 capsule by mouth 2 (two) times daily.     . mirabegron ER ([name]) 50 MG TB24 tablet Take 50 mg by mouth daily.    . nitroGLYCERIN (NITROSTAT) 0.4 MG SL tablet Place 0.4 mg under the tongue every 5 (five) minutes as needed for chest pain.    . pantoprazole (PROTONIX) 40 MG tablet Take 1 tablet (40 mg total) by mouth daily. 90 tablet 3  . predniSONE (DELTASONE) 10 MG tablet TAKE 1 TABLET BY MOUTH ONCE DAILY AS DIRECTED FOR FLARE UPS    . traMADol (ULTRAM) 50 MG tablet TAKE 1 TO 2 TABLETS BY  MOUTH EVERY 8 HOURS AS NEEDED FOR MODERATE OR SEVERE PAIN. 60 tablet 0  . vitamin B-12 (CYANOCOBALAMIN) 1000 MCG tablet Take 1,000 mcg by mouth daily.     . Vitamin D, Ergocalciferol, (DRISDOL) 1.25 MG (50000 UT) CAPS capsule Take 1 capsule by mouth once a week.     No current facility-administered medications on file prior to visit.     Review of Systems  Constitutional: Negative for activity change, appetite change,  fatigue, fever and unexpected weight change.  HENT: Negative for congestion, ear pain, rhinorrhea, sinus pressure and sore throat.   Eyes: Negative for pain, redness and visual disturbance.  Respiratory: Negative for cough, shortness of breath and wheezing.   Cardiovascular: Negative for chest pain and palpitations.  Gastrointestinal: Negative for abdominal pain, blood in stool, constipation and diarrhea.  Endocrine: Negative for polydipsia and polyuria.  Genitourinary: Negative for dysuria, frequency and urgency.  Musculoskeletal: Positive for gait problem. Negative for arthralgias, back pain and myalgias.       Unsteady gait from dizziness   Skin: Negative for pallor and rash.  Allergic/Immunologic: Negative for environmental allergies.  Neurological: Positive for dizziness. Negative for tremors, seizures, syncope, facial asymmetry, speech difficulty, weakness, numbness and headaches.  Hematological: Negative for adenopathy. Does not bruise/bleed easily.  Psychiatric/Behavioral: Negative for decreased concentration and dysphoric mood. The patient is not nervous/anxious.        Objective:   Physical Exam Constitutional:      General: She is not in acute distress.    Appearance: Normal appearance. She is well-developed and normal weight. She is not ill-appearing or diaphoretic.  HENT:     Head: Normocephalic and atraumatic.     Right Ear: Tympanic membrane, ear canal and external ear normal. There is no impacted cerumen.     Left Ear: Tympanic membrane, ear canal and external ear normal. There is no impacted cerumen.     Nose: Nose normal. No congestion.     Mouth/Throat:     Mouth: Mucous membranes are moist.     Pharynx: Oropharynx is clear. No oropharyngeal exudate.  Eyes:     General: No scleral icterus.       Right eye: No discharge.        Left eye: No discharge.     Conjunctiva/sclera: Conjunctivae normal.     Pupils: Pupils are equal, round, and reactive to light.      Comments: 2-3 beats of horizontal nystagmus bilaterally  Neck:     Musculoskeletal: Full passive range of motion without pain, normal range of motion and neck supple. No neck rigidity or muscular tenderness.     Thyroid: No thyromegaly.     Vascular: No carotid bruit or JVD.     Trachea: No tracheal deviation.  Cardiovascular:     Rate and Rhythm: Normal rate and regular rhythm.     Heart sounds: Murmur present.  Pulmonary:     Effort: Pulmonary effort is normal. No respiratory distress.     Breath sounds: Normal breath sounds. No wheezing or rales.  Abdominal:     General: Bowel sounds are normal. There is no distension.     Palpations: Abdomen is soft. There is no mass.     Tenderness: There is no abdominal tenderness.     Hernia: No hernia is present.  Musculoskeletal:        General: No tenderness.     Right lower leg: No edema.     Left lower leg: No edema.  Lymphadenopathy:  Cervical: No cervical adenopathy.  Skin:    General: Skin is warm and dry.     Coloration: Skin is not pale.     Findings: No rash.  Neurological:     Mental Status: She is alert and oriented to person, place, and time.     Cranial Nerves: Cranial nerves are intact. No cranial nerve deficit, dysarthria or facial asymmetry.     Sensory: No sensory deficit.     Motor: No weakness, tremor, atrophy, abnormal muscle tone or pronator drift.     Coordination: Romberg sign negative. Coordination normal. Finger-Nose-Finger Test normal.     Gait: Gait normal.     Deep Tendon Reflexes: Reflexes are normal and symmetric. Reflexes normal.     Comments: No focal cerebellar signs  Gait is slow and guarded (not shuffling)  Psychiatric:        Behavior: Behavior normal.        Thought Content: Thought content normal.           Assessment & Plan:   Problem List Items Addressed This Visit      Cardiovascular and Mediastinum   Essential hypertension    bp in fair control at this time  BP Readings from  Last 1 Encounters:  02/02/19 118/60   No changes needed Most recent labs reviewed  Disc lifstyle change with low sodium diet and exercise  utd cardiology care  No orthostatic changes today        Other   Dizziness    In pt with complex h/o vascular issues (recent carotid study stable), CHF and past small vessel cva  She has nystagmus on exam today and notes symptoms come on with lateral movement of eyes and head  No neuro changes today Disc poss of inner ear issue, will ref to ENT for further evaluation  inst to watch for s/s of cva (reviewed) or headaches or any other symptoms  Use caution not to fall (walk with assistance and walker)       Relevant Orders   Ambulatory referral to ENT

## 2019-02-02 NOTE — Assessment & Plan Note (Signed)
bp in fair control at this time  BP Readings from Last 1 Encounters:  02/02/19 118/60   No changes needed Most recent labs reviewed  Disc lifstyle change with low sodium diet and exercise  utd cardiology care  No orthostatic changes today

## 2019-02-02 NOTE — Assessment & Plan Note (Signed)
In pt with complex h/o vascular issues (recent carotid study stable), CHF and past small vessel cva  She has nystagmus on exam today and notes symptoms come on with lateral movement of eyes and head  No neuro changes today Disc poss of inner ear issue, will ref to ENT for further evaluation  inst to watch for s/s of cva (reviewed) or headaches or any other symptoms  Use caution not to fall (walk with assistance and walker)

## 2019-02-02 NOTE — Patient Instructions (Addendum)
The office will call you regarding an ENT referral to check out your dizziness   Please try not to change position quickly -especially turning head  Also do not drive if you are dizzy   If symptoms worsen in the meantime call and let me know  If any stroke symptoms -call 911 Also if headache or any new symptoms please let me know  Be careful not to fall

## 2019-02-03 ENCOUNTER — Encounter: Payer: Self-pay | Admitting: Family Medicine

## 2019-02-04 ENCOUNTER — Other Ambulatory Visit: Payer: Self-pay | Admitting: Family Medicine

## 2019-02-04 MED ORDER — LOSARTAN POTASSIUM-HCTZ 50-12.5 MG PO TABS: 1.0000 | ORAL_TABLET | Freq: Every day | ORAL | 0 refills | Status: DC

## 2019-02-11 DIAGNOSIS — H903 Sensorineural hearing loss, bilateral: Secondary | ICD-10-CM | POA: Diagnosis not present

## 2019-02-11 DIAGNOSIS — R42 Dizziness and giddiness: Secondary | ICD-10-CM | POA: Diagnosis not present

## 2019-02-18 DIAGNOSIS — Z8673 Personal history of transient ischemic attack (TIA), and cerebral infarction without residual deficits: Secondary | ICD-10-CM | POA: Diagnosis not present

## 2019-02-18 DIAGNOSIS — Z23 Encounter for immunization: Secondary | ICD-10-CM | POA: Diagnosis not present

## 2019-02-18 DIAGNOSIS — R42 Dizziness and giddiness: Secondary | ICD-10-CM | POA: Diagnosis not present

## 2019-03-04 ENCOUNTER — Telehealth: Payer: Self-pay | Admitting: Family Medicine

## 2019-03-04 NOTE — Telephone Encounter (Signed)
Malachy Mood w/ [employer] was calling in regards to the patient's medication They were unable to reach the patient to see why the patient has not refilled the Losartan in well over 2 months . And a few other medications that were not refilled in the last month/.    We were not sure we could give any information out without consent from the patient so we wanted to double check with you.     [facility]heryl[facility] c/b # [phone]

## 2019-03-05 NOTE — Telephone Encounter (Signed)
Patient is being adequately followed closely on her medications by PCP.  Without a signed release I cannot share information with this company regarding patients care.

## 2019-03-08 DIAGNOSIS — Z950 Presence of cardiac pacemaker: Secondary | ICD-10-CM | POA: Diagnosis not present

## 2019-03-08 DIAGNOSIS — G9389 Other specified disorders of brain: Secondary | ICD-10-CM | POA: Diagnosis not present

## 2019-03-08 DIAGNOSIS — I6602 Occlusion and stenosis of left middle cerebral artery: Secondary | ICD-10-CM | POA: Diagnosis not present

## 2019-03-08 DIAGNOSIS — R260 Ataxic gait: Secondary | ICD-10-CM | POA: Diagnosis not present

## 2019-03-08 DIAGNOSIS — Z95810 Presence of automatic (implantable) cardiac defibrillator: Secondary | ICD-10-CM | POA: Diagnosis not present

## 2019-03-08 DIAGNOSIS — I10 Essential (primary) hypertension: Secondary | ICD-10-CM | POA: Diagnosis not present

## 2019-03-08 DIAGNOSIS — R42 Dizziness and giddiness: Secondary | ICD-10-CM | POA: Diagnosis not present

## 2019-03-08 DIAGNOSIS — R918 Other nonspecific abnormal finding of lung field: Secondary | ICD-10-CM | POA: Diagnosis not present

## 2019-03-08 DIAGNOSIS — Z8673 Personal history of transient ischemic attack (TIA), and cerebral infarction without residual deficits: Secondary | ICD-10-CM | POA: Diagnosis not present

## 2019-03-22 DIAGNOSIS — R42 Dizziness and giddiness: Secondary | ICD-10-CM | POA: Diagnosis not present

## 2019-03-31 DIAGNOSIS — R69 Illness, unspecified: Secondary | ICD-10-CM | POA: Diagnosis not present

## 2019-04-01 ENCOUNTER — Other Ambulatory Visit: Payer: Self-pay | Admitting: Family Medicine

## 2019-04-01 NOTE — Telephone Encounter (Signed)
Name of [place] Name of [place] or Written Date and Quantity:12/08/58 #60 tabs with 0 refills Last Office Visit and Type:02/02/2019 acute Next Office Visit and Type:CPE/AWV 06/15/19

## 2019-05-11 DIAGNOSIS — Z4502 Encounter for adjustment and management of automatic implantable cardiac defibrillator: Secondary | ICD-10-CM | POA: Diagnosis not present

## 2019-05-11 DIAGNOSIS — Z95810 Presence of automatic (implantable) cardiac defibrillator: Secondary | ICD-10-CM | POA: Diagnosis not present

## 2019-05-20 DIAGNOSIS — L508 Other urticaria: Secondary | ICD-10-CM | POA: Diagnosis not present

## 2019-05-20 DIAGNOSIS — L659 Nonscarring hair loss, unspecified: Secondary | ICD-10-CM | POA: Diagnosis not present

## 2019-05-20 DIAGNOSIS — L821 Other seborrheic keratosis: Secondary | ICD-10-CM | POA: Diagnosis not present

## 2019-06-07 DIAGNOSIS — L508 Other urticaria: Secondary | ICD-10-CM | POA: Diagnosis not present

## 2019-06-07 DIAGNOSIS — L659 Nonscarring hair loss, unspecified: Secondary | ICD-10-CM | POA: Diagnosis not present

## 2019-06-08 DIAGNOSIS — R69 Illness, unspecified: Secondary | ICD-10-CM | POA: Diagnosis not present

## 2019-06-10 ENCOUNTER — Telehealth (INDEPENDENT_AMBULATORY_CARE_PROVIDER_SITE_OTHER): Payer: Medicare HMO | Admitting: Family Medicine

## 2019-06-10 ENCOUNTER — Ambulatory Visit (INDEPENDENT_AMBULATORY_CARE_PROVIDER_SITE_OTHER): Payer: Medicare HMO

## 2019-06-10 ENCOUNTER — Other Ambulatory Visit (INDEPENDENT_AMBULATORY_CARE_PROVIDER_SITE_OTHER): Payer: Medicare HMO

## 2019-06-10 ENCOUNTER — Other Ambulatory Visit: Payer: Self-pay

## 2019-06-10 ENCOUNTER — Ambulatory Visit: Payer: Medicare HMO

## 2019-06-10 DIAGNOSIS — E1169 Type 2 diabetes mellitus with other specified complication: Secondary | ICD-10-CM | POA: Diagnosis not present

## 2019-06-10 DIAGNOSIS — Z0000 Encounter for general adult medical examination without abnormal findings: Secondary | ICD-10-CM

## 2019-06-10 DIAGNOSIS — E785 Hyperlipidemia, unspecified: Secondary | ICD-10-CM | POA: Diagnosis not present

## 2019-06-10 DIAGNOSIS — I10 Essential (primary) hypertension: Secondary | ICD-10-CM

## 2019-06-10 LAB — CBC WITH DIFFERENTIAL/PLATELET
Basophils Absolute: 0 10*3/uL (ref 0.0–0.1)
Basophils Relative: 0.4 % (ref 0.0–3.0)
Eosinophils Absolute: 0.6 10*3/uL (ref 0.0–0.7)
Eosinophils Relative: 7.4 % — ABNORMAL HIGH (ref 0.0–5.0)
HCT: 37.5 % (ref 36.0–46.0)
Hemoglobin: 12.3 g/dL (ref 12.0–15.0)
Lymphocytes Relative: 36.9 % (ref 12.0–46.0)
Lymphs Abs: 2.9 10*3/uL (ref 0.7–4.0)
MCHC: 32.9 g/dL (ref 30.0–36.0)
MCV: 95.2 fl (ref 78.0–100.0)
Monocytes Absolute: 0.6 10*3/uL (ref 0.1–1.0)
Monocytes Relative: 7.4 % (ref 3.0–12.0)
Neutro Abs: 3.8 10*3/uL (ref 1.4–7.7)
Neutrophils Relative %: 47.9 % (ref 43.0–77.0)
Platelets: 220 10*3/uL (ref 150.0–400.0)
RBC: 3.94 Mil/uL (ref 3.87–5.11)
RDW: 12.2 % (ref 11.5–15.5)
WBC: 7.9 10*3/uL (ref 4.0–10.5)

## 2019-06-10 LAB — COMPREHENSIVE METABOLIC PANEL
ALT: 17 U/L (ref 0–35)
AST: 22 U/L (ref 0–37)
Albumin: 3.8 g/dL (ref 3.5–5.2)
Alkaline Phosphatase: 63 U/L (ref 39–117)
BUN: 20 mg/dL (ref 6–23)
CO2: 30 mEq/L (ref 19–32)
Calcium: 9.6 mg/dL (ref 8.4–10.5)
Chloride: 107 mEq/L (ref 96–112)
Creatinine, Ser: 1.18 mg/dL (ref 0.40–1.20)
GFR: 43.8 mL/min — ABNORMAL LOW (ref >60.00)
Glucose, Bld: 109 mg/dL — ABNORMAL HIGH (ref 70–99)
Potassium: 5.4 mEq/L — ABNORMAL HIGH (ref 3.5–5.1)
Sodium: 143 mEq/L (ref 135–145)
Total Bilirubin: 0.7 mg/dL (ref 0.2–1.2)
Total Protein: 5.8 g/dL — ABNORMAL LOW (ref 6.0–8.3)

## 2019-06-10 LAB — LIPID PANEL
Cholesterol: 110 mg/dL (ref 0–200)
HDL: 34.7 mg/dL — ABNORMAL LOW (ref >39.00)
LDL Cholesterol: 46 mg/dL (ref 0–99)
NonHDL: 75.23
Total CHOL/HDL Ratio: 3
Triglycerides: 146 mg/dL (ref 0.0–149.0)
VLDL: 29.2 mg/dL (ref 0.0–40.0)

## 2019-06-10 LAB — TSH: TSH: 3.47 u[IU]/mL (ref 0.35–4.50)

## 2019-06-10 NOTE — Progress Notes (Signed)
PCP notes:  Health Maintenance: Needs flu vaccine   Abnormal Screenings: none   Patient concerns: none   Nurse concerns: none    Next PCP appt.: 06/15/2019 @ 11:30 am

## 2019-06-10 NOTE — Patient Instructions (Signed)
Morgan Hubbard , Thank you for taking time to come for your Medicare Wellness Visit. I appreciate your ongoing commitment to your health goals. Please review the following plan we discussed and let me know if I can assist you in the future.   Screening recommendations/referrals: Colonoscopy: no longer required Mammogram: Up to date, completed 11/03/2018 Bone Density: Up to date, completed 07/21/2018 Recommended yearly ophthalmology/optometry visit for glaucoma screening and checkup Recommended yearly dental visit for hygiene and checkup  Vaccinations: Influenza vaccine: decline Pneumococcal vaccine: Completed series Tdap vaccine: decline Shingles vaccine: discussed    Advanced directives: Please bring a copy of your POA (Power of Attorney) and/or Living Will to your next appointment.   Conditions/risks identified: diabetes, hypertension, hypercholesterolemia  Next appointment: 06/15/2019 @ 11:30 am    Preventive Care 26 Years and Older, Female Preventive care refers to lifestyle choices and visits with your health care provider that can promote health and wellness. What does preventive care include?  A yearly physical exam. This is also called an annual well check.  Dental exams once or twice a year.  Routine eye exams. Ask your health care provider how often you should have your eyes checked.  Personal lifestyle choices, including:  Daily care of your teeth and gums.  Regular physical activity.  Eating a healthy diet.  Avoiding tobacco and drug use.  Limiting alcohol use.  Practicing safe sex.  Taking low-dose aspirin every day.  Taking vitamin and mineral supplements as recommended by your health care provider. What happens during an annual well check? The services and screenings done by your health care provider during your annual well check will depend on your age, overall health, lifestyle risk factors, and family history of disease. Counseling  Your health care  provider may ask you questions about your:  Alcohol use.  Tobacco use.  Drug use.  Emotional well-being.  Home and relationship well-being.  Sexual activity.  Eating habits.  History of falls.  Memory and ability to understand (cognition).  Work and work Statistician.  Reproductive health. Screening  You may have the following tests or measurements:  Height, weight, and BMI.  Blood pressure.  Lipid and cholesterol levels. These may be checked every 5 years, or more frequently if you are over 77 years old.  Skin check.  Lung cancer screening. You may have this screening every year starting at age 25 if you have a 30-pack-year history of smoking and currently smoke or have quit within the past 15 years.  Fecal occult blood test (FOBT) of the stool. You may have this test every year starting at age 35.  Flexible sigmoidoscopy or colonoscopy. You may have a sigmoidoscopy every 5 years or a colonoscopy every 10 years starting at age 83.  Hepatitis C blood test.  Hepatitis B blood test.  Sexually transmitted disease (STD) testing.  Diabetes screening. This is done by checking your blood sugar (glucose) after you have not eaten for a while (fasting). You may have this done every 1-3 years.  Bone density scan. This is done to screen for osteoporosis. You may have this done starting at age 17.  Mammogram. This may be done every 1-2 years. Talk to your health care provider about how often you should have regular mammograms. Talk with your health care provider about your test results, treatment options, and if necessary, the need for more tests. Vaccines  Your health care provider may recommend certain vaccines, such as:  Influenza vaccine. This is recommended every year.  Tetanus, diphtheria, and acellular pertussis (Tdap, Td) vaccine. You may need a Td booster every 10 years.  Zoster vaccine. You may need this after age 69.  Pneumococcal 13-valent conjugate (PCV13)  vaccine. One dose is recommended after age 46.  Pneumococcal polysaccharide (PPSV23) vaccine. One dose is recommended after age 18. Talk to your health care provider about which screenings and vaccines you need and how often you need them. This information is not intended to replace advice given to you by your health care provider. Make sure you discuss any questions you have with your health care provider. Document Released: 06/16/2015 Document Revised: 02/07/2016 Document Reviewed: 03/21/2015 Elsevier Interactive Patient Education  2017 [place] Prevention in the Home Falls can cause injuries. They can happen to people of all ages. There are many things you can do to make your home safe and to help prevent falls. What can I do on the outside of my home?  Regularly fix the edges of walkways and driveways and fix any cracks.  Remove anything that might make you trip as you walk through a door, such as a raised step or threshold.  Trim any bushes or trees on the path to your home.  Use bright outdoor lighting.  Clear any walking paths of anything that might make someone trip, such as rocks or tools.  Regularly check to see if handrails are loose or broken. Make sure that both sides of any steps have handrails.  Any raised decks and porches should have guardrails on the edges.  Have any leaves, snow, or ice cleared regularly.  Use sand or salt on walking paths during winter.  Clean up any spills in your garage right away. This includes oil or grease spills. What can I do in the bathroom?  Use night lights.  Install grab bars by the toilet and in the tub and shower. Do not use towel bars as grab bars.  Use non-skid mats or decals in the tub or shower.  If you need to sit down in the shower, use a plastic, non-slip stool.  Keep the floor dry. Clean up any water that spills on the floor as soon as it happens.  Remove soap buildup in the tub or shower  regularly.  Attach bath mats securely with double-sided non-slip rug tape.  Do not have throw rugs and other things on the floor that can make you trip. What can I do in the bedroom?  Use night lights.  Make sure that you have a light by your bed that is easy to reach.  Do not use any sheets or blankets that are too big for your bed. They should not hang down onto the floor.  Have a firm chair that has side arms. You can use this for support while you get dressed.  Do not have throw rugs and other things on the floor that can make you trip. What can I do in the kitchen?  Clean up any spills right away.  Avoid walking on wet floors.  Keep items that you use a lot in easy-to-reach places.  If you need to reach something above you, use a strong step stool that has a grab bar.  Keep electrical cords out of the way.  Do not use floor polish or wax that makes floors slippery. If you must use wax, use non-skid floor wax.  Do not have throw rugs and other things on the floor that can make you trip. What can I do  with my stairs?  Do not leave any items on the stairs.  Make sure that there are handrails on both sides of the stairs and use them. Fix handrails that are broken or loose. Make sure that handrails are as long as the stairways.  Check any carpeting to make sure that it is firmly attached to the stairs. Fix any carpet that is loose or worn.  Avoid having throw rugs at the top or bottom of the stairs. If you do have throw rugs, attach them to the floor with carpet tape.  Make sure that you have a light switch at the top of the stairs and the bottom of the stairs. If you do not have them, ask someone to add them for you. What else can I do to help prevent falls?  Wear shoes that:  Do not have high heels.  Have rubber bottoms.  Are comfortable and fit you well.  Are closed at the toe. Do not wear sandals.  If you use a stepladder:  Make sure that it is fully  opened. Do not climb a closed stepladder.  Make sure that both sides of the stepladder are locked into place.  Ask someone to hold it for you, if possible.  Clearly mark and make sure that you can see:  Any grab bars or handrails.  First and last steps.  Where the edge of each step is.  Use tools that help you move around (mobility aids) if they are needed. These include:  Canes.  Walkers.  Scooters.  Crutches.  Turn on the lights when you go into a dark area. Replace any light bulbs as soon as they burn out.  Set up your furniture so you have a clear path. Avoid moving your furniture around.  If any of your floors are uneven, fix them.  If there are any pets around you, be aware of where they are.  Review your medicines with your doctor. Some medicines can make you feel dizzy. This can increase your chance of falling. Ask your doctor what other things that you can do to help prevent falls. This information is not intended to replace advice given to you by your health care provider. Make sure you discuss any questions you have with your health care provider. Document Released: 03/16/2009 Document Revised: 10/26/2015 Document Reviewed: 06/24/2014 Elsevier Interactive Patient Education  2017 [employer].

## 2019-06-10 NOTE — Progress Notes (Signed)
Subjective:   Morgan Hubbard is a 85 y.o. female who presents for Medicare Annual (Subsequent) preventive examination.  Review of Systems: N/A   This visit is being conducted through telemedicine via telephone at the nurse health advisor's home address due to the COVID-19 pandemic. This patient has given me verbal consent via doximity to conduct this visit, patient states they are participating from their home address. Patient and myself are on the telephone call. There is no referral for this visit. Some vital signs may be absent or patient reported.    Patient identification: identified by name, DOB, and current address   Cardiac Risk Factors include: advanced age (>23men, >15 women);diabetes mellitus;hypertension;Other (see comment), Risk factor comments: hypercholesterolemia     Objective:     Vitals: LMP 06/03/1992   There is no height or weight on file to calculate BMI.  Advanced Directives 06/10/2019 06/11/2018 12/30/2016 04/22/2016 01/02/2016 01/13/2014 01/31/2012  Does Patient Have a Medical Advance Directive? Yes Yes Yes Yes Yes Yes -  Type of Paramedic of [place];Living will [place];Living will [place];Living will [place];Living will [place];Living will [place];Living will -  Does patient want to make changes to medical advance directive? - No - Patient declined No - Patient declined No - Patient declined No - Patient declined - -  Copy of [place] in Chart? No - copy requested No - copy requested No - copy requested Yes No - copy requested No - copy requested -  Pre-existing out of facility DNR order (yellow form or pink MOST form) - - - - - - No    Tobacco Social History   Tobacco Use  Smoking Status Never Smoker  Smokeless Tobacco Never Used     Counseling given: Not Answered   Clinical Intake:  Pre-visit preparation  completed: Yes  Pain : No/denies pain     Nutritional Risks: None Diabetes: Yes CBG done?: No Did pt. bring in CBG monitor from home?: No  How often do you need to have someone help you when you read instructions, pamphlets, or other written materials from your doctor or pharmacy?: 1 - Never What is the last grade level you completed in school?: 12th  Interpreter Needed?: No  Information entered by :: CJohnson, LPN  Past Medical History:  Diagnosis Date  . Arthritis    "hands" (01/13/2014)  . Basal cell carcinoma 01/2014   "bridge of nose"  . Cardiac LV ejection fraction >40%    "it was 43 last year" (01/13/2014)  . Chronic lower back pain   . CKD (chronic kidney disease), stage III    acute on chronic stage III/notes 06/10/2018  . Colon polyps   . Expressive aphasia    "3 times in the last week" (01/13/2014)  . GERD (gastroesophageal reflux disease)   . Goiter past remote   treated with RI  . Hyperlipidemia   . Hyperpotassemia   . Hypertension   . Hypertonicity of bladder   . Inflammatory and toxic neuropathy, unspecified   . LBBB (left bundle branch block)   . Migraine    "stopped many years ago" (01/13/2014)  . Mini stroke ([place]) 01/2014  . Other primary cardiomyopathies   . Overactive bladder   . Presence of combination internal cardiac defibrillator (ICD) and pacemaker   . Reflex sympathetic dystrophy, unspecified   . Shingles   . Shortness of breath  ambulation  . Stroke ([place])   . Thyroid disease   . Type II diabetes mellitus ([place])   . Ulcer   . Urine incontinence   . Vertigo    hx of   Past Surgical History:  Procedure Laterality Date  . BACK SURGERY    . BI-VENTRICULAR PACEMAKER INSERTION (CRT-P)  10/2014   [facility]  . BREAST CYST EXCISION Left 1959  . CARDIAC CATHETERIZATION  "several"   [place]  . CATARACT EXTRACTION W/ INTRAOCULAR LENS IMPLANT Left ~ 2005   several eye injections  . CHOLECYSTECTOMY N/A 06/13/2018   Procedure: LAPAROSCOPIC  CHOLECYSTECTOMY;  Surgeon: Donnie Mesa, MD;  Location: [facility];  Service: General;  Laterality: N/A;  . COLONOSCOPY  7/12   normal (hx of polyps in past)   . CT SCAN  3/12   outside hosp- lung nodule and gallstones  . ERCP N/A 06/12/2018   Procedure: ENDOSCOPIC RETROGRADE CHOLANGIOPANCREATOGRAPHY (ERCP);  Surgeon: Carol Ada, MD;  Location: [facility];  Service: Endoscopy;  Laterality: N/A;  . [place]   "knot on index"  . KIDNEY DONATION Left 1989  . LUMBAR LAMINECTOMY/DECOMPRESSION MICRODISCECTOMY  1999  . LUMBAR LAMINECTOMY/DECOMPRESSION MICRODISCECTOMY  01/31/2012   Procedure: LUMBAR LAMINECTOMY/DECOMPRESSION MICRODISCECTOMY 2 LEVELS;  Surgeon: Eustace Moore, MD;  Location: [facility]URO ORS;  Service: Neurosurgery;  Laterality: N/A;  Thoracic twelve-lumbar one, lumbar one-two laminectomy   . POSTERIOR LAMINECTOMY / DECOMPRESSION CERVICAL SPINE  1999  . REMOVAL OF STONES  06/12/2018   Procedure: REMOVAL OF STONES;  Surgeon: Carol Ada, MD;  Location[facility] ENDOSCOPY;  Service: Endoscopy;;  . Joan Mayans  06/12/2018   Procedure: Joan Mayans;  Surgeon: Carol Ada, MD;  Location: [facility] ENDOSCOPY;  Service: Endoscopy;;  . [place]  . UPPER GASTROINTESTINAL ENDOSCOPY     Family History  Problem Relation Age of Onset  . Arthritis Mother   . Cancer Mother        uterine and mouth  . Hyperlipidemia Mother   . Stroke Mother   . Hypertension Mother   . Alcohol abuse Father   . Diabetes Father   . Cancer Sister        breast  . Diabetes Sister   . Cancer Brother        lung cancer  . Kidney disease Brother   . Diabetes Brother   . Hyperlipidemia Sister   . Heart disease Sister   . Hypertension Sister   . Diabetes Sister   . Hyperlipidemia Brother   . Kidney failure Brother   . Diabetes Daughter   . Addison's disease Other    Social History   Socioeconomic History  . Marital status: Married    Spouse name: Not on file  . Number of  children: 2  . Years of education: Not on file  . Highest education level: Not on file  Occupational History  . Occupation: retired - Production manager   Tobacco Use  . Smoking status: Never Smoker  . Smokeless tobacco: Never Used  Substance and Sexual Activity  . Alcohol use: No    Alcohol/week: 0.0 standard drinks  . Drug use: No  . Sexual activity: Yes  Other Topics Concern  . Not on file  Social History Narrative   Married 2nd time   Daughter is Surveyor, minerals    Social Determinants of Health   Financial Resource Strain: Low Risk   . Difficulty of Paying Living Expenses: Not hard at all  Food Insecurity: No  Food Insecurity  . Worried About Charity fundraiser in the Last Year: Never true  . Ran Out of Food in the Last Year: Never true  Transportation Needs: No Transportation Needs  . Lack of Transportation (Medical): No  . Lack of Transportation (Non-Medical): No  Physical Activity: Inactive  . Days of Exercise per Week: 0 days  . Minutes of Exercise per Session: 0 min  Stress: No Stress Concern Present  . Feeling of Stress : Not at all  Social Connections:   . Frequency of Communication with Friends and Family: Not on file  . Frequency of Social Gatherings with Friends and Family: Not on file  . Attends Religious Services: Not on file  . Active Member of Clubs or Organizations: Not on file  . Attends Archivist Meetings: Not on file  . Marital Status: Not on file    Outpatient Encounter Medications as of 06/10/2019  Medication Sig  . aspirin 325 MG tablet Take 325 mg by mouth daily.  Marland Kitchen atorvastatin (LIPITOR) 80 MG tablet Take 80 mg by mouth daily.  . Biotin w/ Vitamins C & E (HAIR/SKIN/NAILS PO) Take by mouth.  . carvedilol (COREG) 12.5 MG tablet Take 1 tablet (12.5 mg total) by mouth 2 (two) times daily with a meal.  . cetirizine (ZYRTEC) 10 MG tablet Take 10 mg by mouth 2 (two) times daily.  . clotrimazole-betamethasone (LOTRISONE) cream Apply 1  application topically as needed (groin).   . dapsone 25 MG tablet Take 25 mg by mouth daily.   . Ferrous Sulfate (IRON PO) Take 1 tablet by mouth 2 (two) times a day. 200mg  tab  . gabapentin (NEURONTIN) 300 MG capsule Take 1 capsule (300 mg total) by mouth 3 (three) times daily.  Marland Kitchen glimepiride (AMARYL) 2 MG tablet Take 1 tablet by mouth daily.  Marland Kitchen glucose blood (ONE TOUCH ULTRA TEST) test strip Use 3 (three) times daily.  . hydrALAZINE (APRESOLINE) 50 MG tablet Take 1 tablet by mouth 2 (two) times daily.  . isosorbide mononitrate (IMDUR) 30 MG 24 hr tablet Take 1 tablet (30 mg total) by mouth daily.  Marland Kitchen letrozole (FEMARA) 2.5 MG tablet Take 2.5 mg by mouth daily.  Marland Kitchen losartan (COZAAR) 50 MG tablet Take 50 mg by mouth daily.  Marland Kitchen losartan-hydrochlorothiazide (HYZAAR) 50-12.5 MG tablet Take 1 tablet by mouth daily.  . metFORMIN (GLUCOPHAGE-XR) 500 MG 24 hr tablet Take 1,000 mg by mouth 2 (two) times daily.  Marland Kitchen MILK THISTLE PO Take 1 capsule by mouth 2 (two) times daily.   . mirabegron ER ([name]) 50 MG TB24 tablet Take 50 mg by mouth daily.  . nitroGLYCERIN (NITROSTAT) 0.4 MG SL tablet Place 0.4 mg under the tongue every 5 (five) minutes as needed for chest pain.  . pantoprazole (PROTONIX) 40 MG tablet Take 1 tablet (40 mg total) by mouth daily.  . predniSONE (DELTASONE) 10 MG tablet TAKE 1 TABLET BY MOUTH ONCE DAILY AS DIRECTED FOR FLARE UPS  . traMADol (ULTRAM) 50 MG tablet TAKE 1 TO 2 TABLETS BY MOUTH EVERY 8 HOURS AS NEEDED FOR MODERATE OR SEVERE PAIN.  Marland Kitchen vitamin B-12 (CYANOCOBALAMIN) 1000 MCG tablet Take 1,000 mcg by mouth daily.   . Vitamin D, Ergocalciferol, (DRISDOL) 1.25 MG (50000 UT) CAPS capsule Take 1 capsule by mouth once a week.   No facility-administered encounter medications on file as of 06/10/2019.    Activities of Daily Living In your present state of health, do you have any difficulty performing the  following activities: 06/10/2019 06/11/2018  Hearing? N -  Vision? N -    Difficulty concentrating or making decisions? N -  Walking or climbing stairs? N -  Comment - -  Dressing or bathing? N -  Doing errands, shopping? N N  Preparing Food and eating ? N -  Using the Toilet? N -  In the past six months, have you accidently leaked urine? Y -  Comment takes medication -  Do you have problems with loss of bowel control? N -  Managing your Medications? N -  Managing your Finances? N -  Housekeeping or managing your Housekeeping? N -  Some recent data might be hidden    Patient Care Team: Tower, [name], MD as PCP - General (Family Medicine) Carolan Clines, MD (Inactive) as Attending Physician (Urology) Minna Merritts, MD as Consulting Physician (Cardiology) Daryll Brod, MD as Referring Physician (Cardiology) Oneta Rack, MD as Consulting Physician (Dermatology) Ward, Jeani Hawking, MD as Referring Physician (Internal Medicine) Wynona Canes, MD as Referring Physician (Neurology) Kimmick, Linus Mako, MD as Referring Physician (Oncology) Solum, Betsey Holiday, MD as Physician Assistant (Internal Medicine) [name], MD as Consulting Physician (Family Medicine) Alvester Chou., DDS as Consulting Physician (Dentistry) Birder Robson, MD as Referring Physician (Ophthalmology)    Assessment:   This is a routine wellness examination for Rees.  Exercise Activities and Dietary recommendations Current Exercise Habits: The patient does not participate in regular exercise at present, Exercise limited by: None identified  Goals    . Increase water intake     Starting 04/22/2016, I will attempt to drink at least 8 oz of water with each meal daily.     . Patient Stated     06/10/2019, I will maintain and continue medications as prescribed.        Fall Risk Fall Risk  06/10/2019 06/09/2018 05/23/2017 04/22/2016 01/25/2016  Falls in the past year? 0 0 Yes No (No Data)  Comment - - - - no falls since last visit  Number falls in past  yr: 0 0 1 - -  Injury with Fall? 0 - No - -  Risk for fall due to : Medication side effect - - - -  Risk for fall due to: Comment - - - - -  Follow up Falls evaluation completed;Falls prevention discussed - - - -   Is the patient's home free of loose throw rugs in walkways, pet beds, electrical cords, etc?   yes      Grab bars in the bathroom? yes      Handrails on the stairs?   yes      Adequate lighting?   yes  Timed Get Up and Go performed: N/A  Depression Screen PHQ 2/9 Scores 06/10/2019 06/09/2018 05/23/2017 04/22/2016  PHQ - 2 Score 0 0 0 0  PHQ- 9 Score 0 - - -     Cognitive Function MMSE - Mini Mental State Exam 06/10/2019 04/22/2016  Orientation to time 5 5  Orientation to Place 5 5  Registration 3 3  Attention/ Calculation 5 0  Recall 3 3  Language- name 2 objects - 0  Language- repeat 1 1  Language- follow 3 step command - 3  Language- read & follow direction - 0  Write a sentence - 0  Copy design - 0  Total score - 20  Mini Cog  Mini-Cog screen was completed. Maximum score is 22. A value of 0 denotes this part of the  MMSE was not completed or the patient failed this part of the Mini-Cog screening.       Immunization History  Administered Date(s) Administered  . Influenza,inj,Quad PF,6+ Mos 05/24/2015, 04/03/2016, 03/09/2018  . Influenza-Unspecified 03/29/2013, 04/11/2014, 04/03/2016, 03/17/2017  . Pneumococcal Conjugate-13 06/13/2014  . Pneumococcal Polysaccharide-23 01/14/2011    Qualifies for Shingles Vaccine? yes  Screening Tests Health Maintenance  Topic Date Due  . FOOT EXAM  06/10/2019  . INFLUENZA VACCINE  09/01/2019 (Originally 01/02/2019)  . TETANUS/TDAP  06/09/2028 (Originally 12/09/1953)  . HEMOGLOBIN A1C  07/06/2019  . MAMMOGRAM  11/03/2019  . OPHTHALMOLOGY EXAM  01/22/2020  . DEXA SCAN  Completed  . PNA vac Low Risk Adult  Completed    Cancer Screenings: Lung: Low Dose CT Chest recommended if Age 50-80 years, 30 pack-year currently  smoking OR have quit w/in 15years. Patient does not qualify. Breast:  Up to date on Mammogram? Yes, completed 11/03/2018   Up to date of Bone Density/Dexa? Yes, completed 07/21/2018 Colorectal: no longer required  Additional Screenings:  Hepatitis C Screening: N/A     Plan:    Patient will maintain and continue medications as prescribed.    I have personally reviewed and noted the following in the patient's chart:   . Medical and social history . Use of alcohol, tobacco or illicit drugs  . Current medications and supplements . Functional ability and status . Nutritional status . Physical activity . Advanced directives . List of other physicians . Hospitalizations, surgeries, and ER visits in previous 12 months . Vitals . Screenings to include cognitive, depression, and falls . Referrals and appointments  In addition, I have reviewed and discussed with patient certain preventive protocols, quality metrics, and best practice recommendations. A written personalized care plan for preventive services as well as general preventive health recommendations were provided to patient.     Andrez Grime, LPN  1/0/3128

## 2019-06-10 NOTE — Telephone Encounter (Signed)
Lab orders

## 2019-06-15 ENCOUNTER — Ambulatory Visit: Payer: Medicare HMO

## 2019-06-15 ENCOUNTER — Ambulatory Visit (INDEPENDENT_AMBULATORY_CARE_PROVIDER_SITE_OTHER): Payer: Medicare HMO | Admitting: Family Medicine

## 2019-06-15 ENCOUNTER — Encounter: Payer: Self-pay | Admitting: Family Medicine

## 2019-06-15 ENCOUNTER — Other Ambulatory Visit: Payer: Self-pay

## 2019-06-15 VITALS — BP 134/60 | HR 69 | Temp 96.9°F | Ht 60.25 in | Wt 146.4 lb

## 2019-06-15 DIAGNOSIS — I10 Essential (primary) hypertension: Secondary | ICD-10-CM | POA: Diagnosis not present

## 2019-06-15 DIAGNOSIS — M85839 Other specified disorders of bone density and structure, unspecified forearm: Secondary | ICD-10-CM

## 2019-06-15 DIAGNOSIS — E785 Hyperlipidemia, unspecified: Secondary | ICD-10-CM

## 2019-06-15 DIAGNOSIS — E1129 Type 2 diabetes mellitus with other diabetic kidney complication: Secondary | ICD-10-CM | POA: Diagnosis not present

## 2019-06-15 DIAGNOSIS — I5022 Chronic systolic (congestive) heart failure: Secondary | ICD-10-CM | POA: Diagnosis not present

## 2019-06-15 DIAGNOSIS — E875 Hyperkalemia: Secondary | ICD-10-CM

## 2019-06-15 DIAGNOSIS — E1169 Type 2 diabetes mellitus with other specified complication: Secondary | ICD-10-CM | POA: Diagnosis not present

## 2019-06-15 DIAGNOSIS — N289 Disorder of kidney and ureter, unspecified: Secondary | ICD-10-CM | POA: Diagnosis not present

## 2019-06-15 DIAGNOSIS — Z0000 Encounter for general adult medical examination without abnormal findings: Secondary | ICD-10-CM

## 2019-06-15 MED ORDER — LOSARTAN POTASSIUM-HCTZ 50-12.5 MG PO TABS: 1.0000 | ORAL_TABLET | Freq: Every day | ORAL | 3 refills | Status: DC

## 2019-06-15 MED ORDER — PANTOPRAZOLE SODIUM 40 MG PO TBEC: 40.0000 mg | DELAYED_RELEASE_TABLET | Freq: Every day | ORAL | 3 refills | Status: DC

## 2019-06-15 MED ORDER — GABAPENTIN 300 MG PO CAPS: 300.0000 mg | ORAL_CAPSULE | Freq: Three times a day (TID) | ORAL | 3 refills | Status: DC

## 2019-06-15 NOTE — Assessment & Plan Note (Signed)
From nonischemic cardiomyopathy Pt continues cardiology f/u

## 2019-06-15 NOTE — Patient Instructions (Addendum)
We will hold off on the flu shot until you get a covid shot   If you are interested in the shingles vaccine series (Shingrix), call your insurance or pharmacy to check on coverage and location it must be given.  If affordable - you can schedule it here or at your pharmacy depending on coverage  Wait until other vaccines are updated   Think about starting to use your exercise bike

## 2019-06-15 NOTE — Assessment & Plan Note (Signed)
Lab Results  Component Value Date   K 5.4 No hemolysis seen (H) 06/10/2019   This is fairly baseline for her  She tries to minimize high K foods May have to cut back on bananas

## 2019-06-15 NOTE — Assessment & Plan Note (Signed)
Mild in forearm only  No falls or fx  Taking high dose weekly vit D from another physician  Discussed use of her exercise bike to help bone density

## 2019-06-15 NOTE — Progress Notes (Signed)
Subjective:    Patient ID: Morgan Hubbard, female    DOB: Feb 16, 1935, 85 y.o.   MRN: [id number] This visit occurred during the SARS-CoV-2 public health emergency.  Safety protocols were in place, including screening questions prior to the visit, additional usage of staff PPE, and extensive cleaning of exam room while observing appropriate contact time as indicated for disinfecting solutions.    HPI Here for health maintenance exam and to review chronic medical problems    Is eager to get the covid vaccine    Had amw on 1/7 Noted she needs flu vaccine  - wants to wait until she gets covid vaccine    Wt Readings from Last 3 Encounters:  06/15/19 146 lb 7 oz (66.4 kg)  02/02/19 147 lb 2 oz (66.7 kg)  11/09/18 146 lb (66.2 kg)  stable  28.36 kg/m  Mammogram 6/20 Self breast exam- no lumps  Sister had breast cancer   Zoster status -she had shingles in the past   dexa 2/20 -mild osteopenia forearm only  Falls - none  Fractures- none  Supplements - vit D (not ca)  Exercise - nothing  She walks with a cane / short distances (does not walk well)  She has a recumbent exercise bike   She was put on 50,000 weekly of vit D3 from a doctor at [place]    bp is stable today  No cp or palpitations or headaches or edema  No side effects to medicines  BP Readings from Last 3 Encounters:  06/15/19 140/72  02/02/19 118/60  11/09/18 130/74     Pulse Readings from Last 3 Encounters:  06/15/19 69  02/02/19 78  11/09/18 67   Next cardiology f/u is summer  Doing well   DM2 with neprhopathy Sees endocrinology  Sees doctor soon  Last A1C 6.1 in august  Thinks it will be higher upcoming-diet not as good  129 glucose average     Hyperlipidemia Lab Results  Component Value Date   CHOL 110 06/10/2019   CHOL 118 06/30/2018   CHOL 121 06/09/2018   Lab Results  Component Value Date   HDL 34.70 (L) 06/10/2019   HDL 31.20 (L) 06/30/2018   HDL 33.90 (L) 06/09/2018   Lab Results    Component Value Date   LDLCALC 46 06/10/2019   LDLCALC 61 06/30/2018   LDLCALC 54 06/09/2018   Lab Results  Component Value Date   TRIG 146.0 06/10/2019   TRIG 126.0 06/30/2018   TRIG 164.0 (H) 06/09/2018   Lab Results  Component Value Date   CHOLHDL 3 06/10/2019   CHOLHDL 4 06/30/2018   CHOLHDL 4 06/09/2018   Lab Results  Component Value Date   LDLDIRECT 77.0 04/22/2016   LDLDIRECT 151.3 03/02/2013  taking atorvastatin 80 mg  No exercise for her HDL- will think about it   Renal insuff  Lab Results  Component Value Date   CREATININE 1.18 06/10/2019   BUN 20 06/10/2019   NA 143 06/10/2019   K 5.4 No hemolysis seen (H) 06/10/2019   CL 107 06/10/2019   CO2 30 06/10/2019   Takes losartan  Eats a banana daily  Last endocrinology K was 5.0  Will continue to watch   Patient Active Problem List   Diagnosis Date Noted  . Hair loss 11/09/2018  . Leg weakness, bilateral 06/29/2018  . Pedal edema 06/22/2018  . AICD (automatic cardioverter/defibrillator) present 06/11/2018  . GERD (gastroesophageal reflux disease) 06/10/2018  . Cholecystitis 06/10/2018  .  Tingling 10/06/2017  . Chronic back pain 05/23/2017  . Chronic systolic (congestive) heart failure ([place]) 02/25/2017  . Osteopenia 06/16/2016  . Routine general medical examination at a health care facility 04/24/2016  . Estrogen deficiency 04/24/2016  . Urticaria 11/03/2015  . Cerumen impaction 03/10/2015  . Encounter for Medicare annual wellness exam 06/13/2014  . Stroke, small vessel ([place]) 01/18/2014  . Family history of hemochromatosis 01/18/2014  . Cerebral thrombosis with cerebral infarction ([place]) 01/14/2014  . Expressive aphasia 01/13/2014  . Nonischemic cardiomyopathy ([place]) 04/05/2013  . Shortness of breath 09/03/2012  . Elevated transaminase level 05/18/2012  . Spinal stenosis of lumbar region 12/10/2011  . Mixed incontinence urge and stress 12/10/2011  . Renal insufficiency 11/04/2011  . Goiter  01/14/2011  . Fatty liver 08/28/2010  . Type II diabetes mellitus with renal manifestations ([place]) 07/09/2010  . Hyperlipidemia associated with type 2 diabetes mellitus ([place]) 07/09/2010  . HYPERKALEMIA 07/09/2010  . REFLEX SYMPATHETIC DYSTROPHY 07/09/2010  . Neuropathy 07/09/2010  . Essential hypertension 07/09/2010  . CARDIOMYOPATHY 07/09/2010  . OVERACTIVE BLADDER 07/09/2010   Past Medical History:  Diagnosis Date  . Arthritis    "hands" (01/13/2014)  . Basal cell carcinoma 01/2014   "bridge of nose"  . Cardiac LV ejection fraction >40%    "it was 43 last year" (01/13/2014)  . Chronic lower back pain   . CKD (chronic kidney disease), stage III    acute on chronic stage III/notes 06/10/2018  . Colon polyps   . Expressive aphasia    "3 times in the last week" (01/13/2014)  . GERD (gastroesophageal reflux disease)   . Goiter past remote   treated with RI  . Hyperlipidemia   . Hyperpotassemia   . Hypertension   . Hypertonicity of bladder   . Inflammatory and toxic neuropathy, unspecified   . LBBB (left bundle branch block)   . Migraine    "stopped many years ago" (01/13/2014)  . Mini stroke ([place]) 01/2014  . Other primary cardiomyopathies   . Overactive bladder   . Presence of combination internal cardiac defibrillator (ICD) and pacemaker   . Reflex sympathetic dystrophy, unspecified   . Shingles   . Shortness of breath    ambulation  . Stroke ([place])   . Thyroid disease   . Type II diabetes mellitus ([place])   . Ulcer   . Urine incontinence   . Vertigo    hx of   Past Surgical History:  Procedure Laterality Date  . BACK SURGERY    . BI-VENTRICULAR PACEMAKER INSERTION (CRT-P)  10/2014   [facility]  . BREAST CYST EXCISION Left 1959  . CARDIAC CATHETERIZATION  "several"   [place] [facility]  . CATARACT EXTRACTION W/ INTRAOCULAR LENS IMPLANT Left ~ 2005   several eye injections  . CHOLECYSTECTOMY N/A 06/13/2018   Procedure: LAPAROSCOPIC CHOLECYSTECTOMY;  Surgeon: Donnie Mesa, MD;   Location: [facility];  Service: General;  Laterality: N/A;  . COLONOSCOPY  7/12   normal (hx of polyps in past)   . CT SCAN  3/12   outside hosp- lung nodule and gallstones  . ERCP N/A 06/12/2018   Procedure: ENDOSCOPIC RETROGRADE CHOLANGIOPANCREATOGRAPHY (ERCP);  Surgeon: Carol Ada, MD;  Location: [facility];  Service: Endoscopy;  Laterality: N/A;  . [place]   "knot on index"  . KIDNEY DONATION Left 1989  . LUMBAR LAMINECTOMY/DECOMPRESSION MICRODISCECTOMY  1999  . LUMBAR LAMINECTOMY/DECOMPRESSION MICRODISCECTOMY  01/31/2012   Procedure: LUMBAR LAMINECTOMY/DECOMPRESSION MICRODISCECTOMY 2 LEVELS;  Surgeon: Eustace Moore,  MD;  Location: [facility];  Service: Neurosurgery;  Laterality: N/A;  Thoracic twelve-lumbar one, lumbar one-two laminectomy   . POSTERIOR LAMINECTOMY / DECOMPRESSION CERVICAL SPINE  1999  . REMOVAL OF STONES  06/12/2018   Procedure: REMOVAL OF STONES;  Surgeon: Carol Ada, MD;  Location: [facility] END[facility]COPY;  Service: Endoscopy;;  . Joan Mayans  06/12/2018   Procedure: Joan Mayans;  Surgeon: Carol Ada, MD;  Location: [facility] ENDOSCOPY;  Service: Endoscopy;;  . [place]  . UPPER GASTROINTESTINAL ENDOSCOPY     Social History   Tobacco Use  . Smoking status: Never Smoker  . Smokeless tobacco: Never Used  Substance Use Topics  . Alcohol use: No    Alcohol/week: 0.0 standard drinks  . Drug use: No   Family History  Problem Relation Age of Onset  . Arthritis Mother   . Cancer Mother        uterine and mouth  . Hyperlipidemia Mother   . Stroke Mother   . Hypertension Mother   . Alcohol abuse Father   . Diabetes Father   . Cancer Sister        breast  . Diabetes Sister   . Cancer Brother        lung cancer  . Kidney disease Brother   . Diabetes Brother   . Hyperlipidemia Sister   . Heart disease Sister   . Hypertension Sister   . Diabetes Sister   . Hyperlipidemia Brother   . Kidney failure Brother   . Diabetes  Daughter   . Addison's disease Other    Allergies  Allergen Reactions  . Ace Inhibitors Swelling    REACTION: tongue swelling  . Codeine Other (See Comments)    REACTION: nausea  . Hydrochlorothiazide Other (See Comments)    unknown  . Insulin Detemir Itching  . Lisinopril Swelling    Swelling of tongue  . Nsaids Other (See Comments)    Unable to take D/T kidney issues  . Tylenol [Acetaminophen] Other (See Comments)    Pt has one kidney and says she can't take it   Current Outpatient Medications on File Prior to Visit  Medication Sig Dispense Refill  . aspirin 325 MG tablet Take 325 mg by mouth daily.    Marland Kitchen atorvastatin (LIPITOR) 80 MG tablet Take 80 mg by mouth daily.    . Biotin w/ Vitamins C & E (HAIR/SKIN/NAILS PO) Take by mouth.    . carvedilol (COREG) 12.5 MG tablet Take 1 tablet (12.5 mg total) by mouth 2 (two) times daily with a meal. 180 tablet 4  . cetirizine (ZYRTEC) 10 MG tablet Take 10 mg by mouth 2 (two) times daily.    . clotrimazole-betamethasone (LOTRISONE) cream Apply 1 application topically as needed (groin).     . dapsone 25 MG tablet Take 25 mg by mouth daily.     . Ferrous Sulfate (IRON PO) Take 1 tablet by mouth 2 (two) times a day. 200mg  tab    . glimepiride (AMARYL) 2 MG tablet Take 1 tablet by mouth daily.    Marland Kitchen glucose blood (ONE TOUCH ULTRA TEST) test strip Use 3 (three) times daily.    . hydrALAZINE (APRESOLINE) 50 MG tablet Take 1 tablet by mouth 2 (two) times daily.    . isosorbide mononitrate (IMDUR) 30 MG 24 hr tablet Take 1 tablet (30 mg total) by mouth daily. 90 tablet 4  . letrozole (FEMARA) 2.5 MG tablet Take 2.5 mg by mouth daily.    Marland Kitchen losartan (  COZAAR) 50 MG tablet Take 50 mg by mouth daily.    . metFORMIN (GLUCOPHAGE-XR) 500 MG 24 hr tablet Take 1,000 mg by mouth 2 (two) times daily.    Marland Kitchen MILK THISTLE PO Take 1 capsule by mouth 2 (two) times daily.     . mirabegron ER ([name]) 50 MG TB24 tablet Take 50 mg by mouth daily.    .  nitroGLYCERIN (NITROSTAT) 0.4 MG SL tablet Place 0.4 mg under the tongue every 5 (five) minutes as needed for chest pain.    . predniSONE (DELTASONE) 10 MG tablet TAKE 1 TABLET BY MOUTH ONCE DAILY AS DIRECTED FOR FLARE UPS    . traMADol (ULTRAM) 50 MG tablet TAKE 1 TO 2 TABLETS BY MOUTH EVERY 8 HOURS AS NEEDED FOR MODERATE OR SEVERE PAIN. 60 tablet 0  . vitamin B-12 (CYANOCOBALAMIN) 1000 MCG tablet Take 1,000 mcg by mouth daily.     . Vitamin D, Ergocalciferol, (DRISDOL) 1.25 MG (50000 UT) CAPS capsule Take 1 capsule by mouth once a week.     No current facility-administered medications on file prior to visit.      Review of Systems  Constitutional: Negative for activity change, appetite change, fatigue, fever and unexpected weight change.  HENT: Negative for congestion, ear pain, rhinorrhea, sinus pressure and sore throat.   Eyes: Negative for pain, redness and visual disturbance.  Respiratory: Negative for cough, shortness of breath and wheezing.   Cardiovascular: Negative for chest pain and palpitations.  Gastrointestinal: Negative for abdominal pain, blood in stool, constipation and diarrhea.  Endocrine: Negative for polydipsia and polyuria.  Genitourinary: Negative for dysuria, frequency and urgency.  Musculoskeletal: Positive for arthralgias and back pain. Negative for myalgias.       Walks with a cane  Skin: Negative for pallor and rash.  Allergic/Immunologic: Negative for environmental allergies.  Neurological: Negative for dizziness, syncope and headaches.  Hematological: Negative for adenopathy. Does not bruise/bleed easily.  Psychiatric/Behavioral: Negative for decreased concentration and dysphoric mood. The patient is not nervous/anxious.        Objective:   Physical Exam Constitutional:      General: She is not in acute distress.    Appearance: Normal appearance. She is well-developed and normal weight. She is not ill-appearing or diaphoretic.  HENT:     Head:  Normocephalic and atraumatic.     Right Ear: Tympanic membrane, ear canal and external ear normal.     Left Ear: Tympanic membrane, ear canal and external ear normal.     Nose: Nose normal. No congestion.     Mouth/Throat:     Mouth: Mucous membranes are moist.     Pharynx: Oropharynx is clear. No posterior oropharyngeal erythema.  Eyes:     General: No scleral icterus.    Extraocular Movements: Extraocular movements intact.     Conjunctiva/sclera: Conjunctivae normal.     Pupils: Pupils are equal, round, and reactive to light.  Neck:     Thyroid: No thyromegaly.     Vascular: No carotid bruit or JVD.  Cardiovascular:     Rate and Rhythm: Normal rate and regular rhythm.     Pulses: Normal pulses.     Heart sounds: Normal heart sounds. No gallop.   Pulmonary:     Effort: Pulmonary effort is normal. No respiratory distress.     Breath sounds: Normal breath sounds. No wheezing.     Comments: Good air exch Chest:     Chest wall: No tenderness.  Abdominal:  General: Bowel sounds are normal. There is no distension or abdominal bruit.     Palpations: Abdomen is soft. There is no mass.     Tenderness: There is no abdominal tenderness.     Hernia: No hernia is present.  Genitourinary:    Comments: Breast exam: No mass, nodules, thickening, tenderness, bulging, retraction, inflamation, nipple discharge or skin changes noted.  No axillary or clavicular LA.     Musculoskeletal:        General: No tenderness. Normal range of motion.     Cervical back: Normal range of motion and neck supple. No rigidity. No muscular tenderness.     Right lower leg: No edema.     Left lower leg: No edema.     Comments: No kyphosis   Hammer toes L foot  Lymphadenopathy:     Cervical: No cervical adenopathy.  Skin:    General: Skin is warm and dry.     Coloration: Skin is not pale.     Findings: No erythema or rash.     Comments: [place] on lower post neck  sks diffusely  Solar lentigines diffusely    Neurological:     Mental Status: She is alert. Mental status is at baseline.     Cranial Nerves: No cranial nerve deficit.     Motor: No abnormal muscle tone.     Coordination: Coordination normal.     Gait: Gait normal.     Deep Tendon Reflexes: Reflexes are normal and symmetric. Reflexes normal.  Psychiatric:        Mood and Affect: Mood normal.        Cognition and Memory: Cognition and memory normal.           Assessment & Plan:   Problem List Items Addressed This Visit      Cardiovascular and Mediastinum   Essential hypertension    bp in fair control at this time  BP Readings from Last 1 Encounters:  06/15/19 134/60   No changes needed Most recent labs reviewed  Disc lifstyle change with low sodium diet and exercise        Relevant Medications   losartan-hydrochlorothiazide (HYZAAR) 50-12.5 MG tablet   Chronic systolic (congestive) heart failure (HCC)    From nonischemic cardiomyopathy Pt continues cardiology f/u      Relevant Medications   losartan-hydrochlorothiazide (HYZAAR) 50-12.5 MG tablet     Endocrine   Type II diabetes mellitus with renal manifestations ([place])    Sees endocrinology  Doing well  Last a1c 6.1 Has f/u soon       Relevant Medications   losartan-hydrochlorothiazide (HYZAAR) 50-12.5 MG tablet   Hyperlipidemia associated with type 2 diabetes mellitus ([place])    Disc goals for lipids and reasons to control them Rev last labs with pt Rev low sat fat diet in detail Well controlled with statin and diet Urged more exercise to help raise HDL      Relevant Medications   losartan-hydrochlorothiazide (HYZAAR) 50-12.5 MG tablet     Musculoskeletal and Integument   Osteopenia    Mild in forearm only  No falls or fx  Taking high dose weekly vit D from another physician  Discussed use of her exercise bike to help bone density        Genitourinary   Renal insufficiency    Stable labs Has one kidney K mildly elevated-baseline          Other   HYPERKALEMIA    Lab Results  Component  Value Date   K 5.4 No hemolysis seen (H) 06/10/2019   This is fairly baseline for her  She tries to minimize high K foods May have to cut back on bananas      Routine general medical examination at a health care facility - Primary    Reviewed health habits including diet and exercise and skin cancer prevention Reviewed appropriate screening tests for age  Also reviewed health mt list, fam hx and immunization status , as well as social and family history   Pt is waiting for flu vaccine until she can get her covid vaccine  Disc shingrix vaccine for the future  Rev last dexa -mild osteopenia  Recommended she start using her exercise bike amw reviewed Labs reviewed

## 2019-06-15 NOTE — Assessment & Plan Note (Signed)
Disc goals for lipids and reasons to control them Rev last labs with pt Rev low sat fat diet in detail Well controlled with statin and diet Urged more exercise to help raise HDL

## 2019-06-15 NOTE — Assessment & Plan Note (Addendum)
Stable labs Has one kidney K mildly elevated-baseline

## 2019-06-15 NOTE — Assessment & Plan Note (Signed)
Reviewed health habits including diet and exercise and skin cancer prevention Reviewed appropriate screening tests for age  Also reviewed health mt list, fam hx and immunization status , as well as social and family history   Pt is waiting for flu vaccine until she can get her covid vaccine  Disc shingrix vaccine for the future  Rev last dexa -mild osteopenia  Recommended she start using her exercise bike amw reviewed Labs reviewed

## 2019-06-15 NOTE — Assessment & Plan Note (Signed)
Sees endocrinology  Doing well  Last a1c 6.1 Has f/u soon

## 2019-06-15 NOTE — Assessment & Plan Note (Signed)
bp in fair control at this time  BP Readings from Last 1 Encounters:  06/15/19 134/60   No changes needed Most recent labs reviewed  Disc lifstyle change with low sodium diet and exercise

## 2019-07-09 DIAGNOSIS — R809 Proteinuria, unspecified: Secondary | ICD-10-CM | POA: Diagnosis not present

## 2019-07-09 DIAGNOSIS — E1129 Type 2 diabetes mellitus with other diabetic kidney complication: Secondary | ICD-10-CM | POA: Diagnosis not present

## 2019-07-12 DIAGNOSIS — R69 Illness, unspecified: Secondary | ICD-10-CM | POA: Diagnosis not present

## 2019-07-16 DIAGNOSIS — N1831 Chronic kidney disease, stage 3a: Secondary | ICD-10-CM | POA: Diagnosis not present

## 2019-07-16 DIAGNOSIS — E1121 Type 2 diabetes mellitus with diabetic nephropathy: Secondary | ICD-10-CM | POA: Diagnosis not present

## 2019-07-16 DIAGNOSIS — E1129 Type 2 diabetes mellitus with other diabetic kidney complication: Secondary | ICD-10-CM | POA: Diagnosis not present

## 2019-07-16 DIAGNOSIS — R809 Proteinuria, unspecified: Secondary | ICD-10-CM | POA: Diagnosis not present

## 2019-07-16 DIAGNOSIS — E1169 Type 2 diabetes mellitus with other specified complication: Secondary | ICD-10-CM | POA: Diagnosis not present

## 2019-07-16 DIAGNOSIS — E785 Hyperlipidemia, unspecified: Secondary | ICD-10-CM | POA: Diagnosis not present

## 2019-07-23 DIAGNOSIS — Z95810 Presence of automatic (implantable) cardiac defibrillator: Secondary | ICD-10-CM | POA: Diagnosis not present

## 2019-07-28 ENCOUNTER — Telehealth: Payer: Self-pay | Admitting: Family Medicine

## 2019-07-28 NOTE — Chronic Care Management (AMB) (Signed)
  Chronic Care Management   Note  07/28/2019 Name: FLORENA KOZMA MRN: [id number] DOB: 1935/03/26  York Pellant Mccroskey is a 85 y.o. year old female who is a primary care patient of Tower, [name], MD. I reached out to [employer] by phone today in response to a referral sent by Ms. Rhyleigh W Pagan's PCP, Tower, [name], MD.   Ms. Goodgame was given information about Chronic Care Management services today including:  1. CCM service includes personalized support from designated clinical staff supervised by her physician, including individualized plan of care and coordination with other care providers 2. 24/7 contact phone numbers for assistance for urgent and routine care needs. 3. Service will only be billed when office clinical staff spend 20 minutes or more in a month to coordinate care. 4. Only one practitioner may furnish and bill the service in a calendar month. 5. The patient may stop CCM services at any time (effective at the end of the month) by phone call to the office staff. 6. The patient will be responsible for cost sharing (co-pay) of up to 20% of the service fee (after annual deductible is met).  Patient agreed to services and verbal consent obtained.   Follow up plan:   Raynicia Dukes [employer] Scheduler

## 2019-08-15 NOTE — Progress Notes (Signed)
Cardiology Office Note  Date:  08/17/2019   ID:  Morgan Hubbard, DOB 1934-12-29, MRN [id number]  PCP:  Morgan Greenspan, MD   Chief Complaint  Patient presents with  . office visit    Pt states having tingling on the Left side of head, also having a odd feeling in Left eye. Meds verbally reviewed w/ pt.    HPI:  Morgan Hubbard is a very pleasant 85 year old woman with past medical history of  nonischemic cardiomyopathy,  Biventricular ICD placed May 2016  Cath 2012 with no CAD ejection fraction of 30% in 2009  left bundle branch block,  TIA/stroke August 2015. had difficulty speaking, Walking nephrectomy/donated kidney in 1989,  possible angioedema on lisinopril,  back surgery x3,  minimal carotid b/l in 2015, carotid bruit on the right poorly controlled diabetes Hemoglobin A1c 9.4 to 7.9, She presents for routine followup Of her cardiomyopathy  Ejection fraction of 15-20% in 2015,  Up to 35% in August 2016,  Up to 40% in 2017 Up to 55% in 06/2017  Arthritis in hands, seen by Dr, Morgan Hubbard  Tingling on top of head Left eye twitching  Still on asa 325 daily, started by neuolrogy  Seen by neurology 03/2019, notes reviewed and discussed Seen for dizziness  MRI results reviewed and discussed in detail MRI brain/MRA head w/o contrast 03/08/19: 1. No acute intracranial process. Left frontal lobe encephalomalacia, likely due to prior ischemia.  2. Unchanged high grade narrowing of the left MCA distal M1 segment.  Lab Results  Component Value Date   CHOL 110 06/10/2019   HDL 34.70 (L) 06/10/2019   LDLCALC 46 06/10/2019   TRIG 146.0 06/10/2019   Lab work reviewed from primary care HBA1C 6.0 Gall bladder surgery 2020   gait instability  deconditioning and chronic back pain Would like to go to pain clinic, does not want more surgery Denies any leg swelling  EKG personally reviewed by myself on todays visit Shows paced rhythm 63 bpm paced complexes  Other past medical  history reviewed TIA/stroke August 2015. had difficulty speaking, Walking Initially placed on aspirin, Plavix, been changed by neurology to aspirin 325 mg daily , Plavix held  She continues to follow-up at [facility]  with cardiology and EP Biventricular ICD placed May 2016 for shortness of breath symptoms placed by Morgan Hubbard  Total cholesterol 2016 was 86 , takes Lipitor 80 mg daily  other past medical history History ofsevere back disease she has significant neuropathy in her legs.   Echocardiogram was done for her fatigue and shortness of breath. ejection fraction of 25-30% which was significant drop from prior ejection fraction February 2013 (45%) done at [facility]. normal RVSP.  Prior cardiac catheterization 08/30/2010 in [place] showed no significant coronary artery disease  PMH:   has a past medical history of Arthritis, Basal cell carcinoma (01/2014), Cardiac LV ejection fraction >40%, Chronic lower back pain, CKD (chronic kidney disease), stage III, Colon polyps, Expressive aphasia, GERD (gastroesophageal reflux disease), Goiter (past remote), Hyperlipidemia, Hyperpotassemia, Hypertension, Hypertonicity of bladder, Inflammatory and toxic neuropathy, unspecified, LBBB (left bundle branch block), Migraine, Mini stroke ([place]) (01/2014), Other primary cardiomyopathies, Overactive bladder, Presence of combination internal cardiac defibrillator (ICD) and pacemaker, Reflex sympathetic dystrophy, unspecified, Shingles, Shortness of breath, Stroke ([place]), Thyroid disease, Type II diabetes mellitus ([place]), Ulcer, Urine incontinence, and Vertigo.  PSH:    Past Surgical History:  Procedure Laterality Date  . BACK SURGERY    . BI-VENTRICULAR PACEMAKER INSERTION (CRT-P)  10/2014  [facility]  . BREAST CYST EXCISION Left 1959  . CARDIAC CATHETERIZATION  "several"   [place]  . CATARACT EXTRACTION W/ INTRAOCULAR LENS IMPLANT Left ~ 2005   several eye injections  . CHOLECYSTECTOMY  N/A 06/13/2018   Procedure: LAPAROSCOPIC CHOLECYSTECTOMY;  Surgeon: Morgan Mesa, MD;  Location: [place];  Service: General;  Laterality: N/A;  . COLONOSCOPY  7/12   normal (hx of polyps in past)   . CT SCAN  3/12   outside hosp- lung nodule and gallstones  . ERCP N/A 06/12/2018   Procedure: ENDOSCOPIC RETROGRADE CHOLANGIOPANCREATOGRAPHY (ERCP);  Surgeon: Morgan Ada, MD;  Location: [place];  Service: Endoscopy;  Laterality: N/A;  . [place]   "knot on index"  . KIDNEY DONATION Left 1989  . LUMBAR LAMINECTOMY/DECOMPRESSION MICRODISCECTOMY  1999  . LUMBAR LAMINECTOMY/DECOMPRESSION MICRODISCECTOMY  01/31/2012   Procedure: LUMBAR LAMINECTOMY/DECOMPRESSION MICRODISCECTOMY 2 LEVELS;  Surgeon: Morgan Moore, MD;  Location: [place] NEURO ORS;  Service: Neurosurgery;  Laterality: N/A;  Thoracic twelve-lumbar one, lumbar one-two laminectomy   . POSTERIOR LAMINECTOMY / DECOMPRESSION CERVICAL SPINE  1999  . REMOVAL OF STONES  06/12/2018   Procedure: REMOVAL OF STONES;  Surgeon: Morgan Ada, MD;  Location: [facility] ENDOSCOPY;  Service: Endoscopy;;  . Morgan Hubbard  06/12/2018   Procedure: Morgan Hubbard;  Surgeon: Morgan Ada, MD;  Location: [facility] ENDOSCOPY;  Service: Endoscopy;;  . [place]  . UPPER GASTROINTESTINAL ENDOSCOPY      Current Outpatient Medications  Medication Sig Dispense Refill  . aspirin 325 MG tablet Take 325 mg by mouth daily.    Marland Kitchen atorvastatin (LIPITOR) 80 MG tablet Take 80 mg by mouth daily.    . Biotin w/ Vitamins C & E (HAIR/SKIN/NAILS PO) Take by mouth.    . carvedilol (COREG) 12.5 MG tablet Take 1 tablet (12.5 mg total) by mouth 2 (two) times daily with a meal. 180 tablet 4  . cetirizine (ZYRTEC) 10 MG tablet Take 10 mg by mouth 2 (two) times daily.    . clotrimazole-betamethasone (LOTRISONE) cream Apply 1 application topically as needed (groin).     . dapsone 25 MG tablet Take 25 mg by mouth daily.     . Ferrous Sulfate (IRON PO) Take 1  tablet by mouth 2 (two) times a day. 200mg  tab    . gabapentin (NEURONTIN) 300 MG capsule Take 1 capsule (300 mg total) by mouth 3 (three) times daily. 270 capsule 3  . glimepiride (AMARYL) 2 MG tablet Take 1 tablet by mouth daily.    Marland Kitchen glucose blood (ONE TOUCH ULTRA TEST) test strip Use 3 (three) times daily.    . hydrALAZINE (APRESOLINE) 50 MG tablet Take 1 tablet by mouth 2 (two) times daily.    . isosorbide mononitrate (IMDUR) 30 MG 24 hr tablet Take 1 tablet (30 mg total) by mouth daily. 90 tablet 4  . letrozole (FEMARA) 2.5 MG tablet Take 2.5 mg by mouth daily.    Marland Kitchen losartan-hydrochlorothiazide (HYZAAR) 50-12.5 MG tablet Take 1 tablet by mouth daily. 90 tablet 3  . metFORMIN (GLUCOPHAGE-XR) 500 MG 24 hr tablet Take 1,000 mg by mouth 2 (two) times daily.    Marland Kitchen MILK THISTLE PO Take 1 capsule by mouth 2 (two) times daily.     . mirabegron ER ([name]) 50 MG TB24 tablet Take 50 mg by mouth daily.    . nitroGLYCERIN (NITROSTAT) 0.4 MG SL tablet Place 0.4 mg under the tongue every 5 (five) minutes as needed for chest pain.    Marland Kitchen  pantoprazole (PROTONIX) 40 MG tablet Take 1 tablet (40 mg total) by mouth daily. 90 tablet 3  . predniSONE (DELTASONE) 10 MG tablet TAKE 1 TABLET BY MOUTH ONCE DAILY AS DIRECTED FOR FLARE UPS    . traMADol (ULTRAM) 50 MG tablet TAKE 1 TO 2 TABLETS BY MOUTH EVERY 8 HOURS AS NEEDED FOR MODERATE OR SEVERE PAIN. 60 tablet 0  . vitamin B-12 (CYANOCOBALAMIN) 1000 MCG tablet Take 1,000 mcg by mouth daily.     . Vitamin D, Ergocalciferol, (DRISDOL) 1.25 MG (50000 UT) CAPS capsule Take 1 capsule by mouth once a week.     No current facility-administered medications for this visit.     Allergies:   Ace inhibitors, Codeine, Hydrochlorothiazide, Insulin detemir, Lisinopril, Nsaids, and Tylenol [acetaminophen]   Social History:  The patient  reports that she has never smoked. She has never used smokeless tobacco. She reports that she does not drink alcohol or use drugs.   Family  History:   family history includes Addison's disease in an other family member; Alcohol abuse in her father; Arthritis in her mother; Cancer in her brother, mother, and sister; Diabetes in her brother, daughter, father, sister, and sister; Heart disease in her sister; Hyperlipidemia in her brother, mother, and sister; Hypertension in her mother and sister; Kidney disease in her brother; Kidney failure in her brother; Stroke in her mother.    Review of Systems: Review of Systems  HENT: Negative.   Respiratory: Positive for shortness of breath.   Cardiovascular: Negative.   Gastrointestinal: Negative.   Musculoskeletal: Negative.        Gait instability  Neurological: Positive for weakness.  Psychiatric/Behavioral: Negative.   All other systems reviewed and are negative.   PHYSICAL EXAM: VS:  BP 120/60 (BP Location: Left Arm, Patient Position: Sitting, Cuff Size: Normal)   Pulse 63   Ht 5\' 1"  (1.549 m)   Wt 148 lb 4 oz (67.2 kg)   LMP 06/03/1992   SpO2 94%   BMI 28.01 kg/m  , BMI Body mass index is 28.01 kg/m. Constitutional:  oriented to person, place, and time. No distress. wheelchair HENT:  Head: Grossly normal Eyes:  no discharge. No scleral icterus.  Neck: No JVD, no carotid bruits  Cardiovascular: Regular rate and rhythm, no murmurs appreciated Pulmonary/Chest: Clear to auscultation bilaterally, no wheezes or rails Abdominal: Soft.  no distension.  no tenderness.  Musculoskeletal: Normal range of motion Neurological:  normal muscle tone. Coordination normal. No atrophy Skin: Skin warm and dry Psychiatric: normal affect, pleasant  Recent Labs: 06/10/2019: ALT 17; BUN 20; Creatinine, Ser 1.18; Hemoglobin 12.3; Platelets 220.0; Potassium 5.4 No hemolysis seen; Sodium 143; TSH 3.47    Lipid Panel Lab Results  Component Value Date   CHOL 110 06/10/2019   HDL 34.70 (L) 06/10/2019   LDLCALC 46 06/10/2019   TRIG 146.0 06/10/2019      Wt Readings from Last 3 Encounters:   08/17/19 148 lb 4 oz (67.2 kg)  06/15/19 146 lb 7 oz (66.4 kg)  02/02/19 147 lb 2 oz (66.7 kg)     ASSESSMENT AND PLAN:  HYPERCHOLESTEROLEMIA Cholesterol is at goal on the current lipid regimen. No changes to the medications were made.  Stable  Essential hypertension Blood pressure is well controlled on today's visit. No changes made to the medications.  Stable  Nonischemic cardiomyopathy (HCC) echocardiogram from 2019  normal ejection fraction EF estimated 55% or higher No medication changes made No further work-up at this time Stable  Cerebral thrombosis with cerebral infarction [facility]) Long discussion concerning cerebrovascular disease, neurology notes reviewed, MRI is reviewed No further episodes, on aspirin 325 Recommend she discuss the dosing with neurology, has been on this for more than 5 years Followed by neurology  Type 2 diabetes mellitus without complication, unspecified long term insulin use status (HCC) hemoglobin A1c 6 stable  Shortness of breath  deconditioning, unable to exercise   Disposition:   F/U  12 months   Total encounter time more than 45 minutes  Greater than 50% was spent in counseling and coordination of care with the patient    No orders of the defined types were placed in this encounter.   Total encounter time more than 25 minutes  Greater than 50% was spent in counseling and coordination of care with the patient   Signed, Esmond Plants, M.D., Ph.D. 08/17/2019  [place], [place]

## 2019-08-16 ENCOUNTER — Telehealth: Payer: Medicare HMO

## 2019-08-17 ENCOUNTER — Ambulatory Visit (INDEPENDENT_AMBULATORY_CARE_PROVIDER_SITE_OTHER): Payer: Medicare HMO | Admitting: Cardiovascular Disease

## 2019-08-17 ENCOUNTER — Other Ambulatory Visit: Payer: Self-pay

## 2019-08-17 VITALS — BP 120/60 | HR 63 | Ht 61.0 in | Wt 148.2 lb

## 2019-08-17 DIAGNOSIS — E7800 Pure hypercholesterolemia, unspecified: Secondary | ICD-10-CM

## 2019-08-17 DIAGNOSIS — I5022 Chronic systolic (congestive) heart failure: Secondary | ICD-10-CM

## 2019-08-17 DIAGNOSIS — R0602 Shortness of breath: Secondary | ICD-10-CM | POA: Diagnosis not present

## 2019-08-17 DIAGNOSIS — E119 Type 2 diabetes mellitus without complications: Secondary | ICD-10-CM

## 2019-08-17 DIAGNOSIS — I10 Essential (primary) hypertension: Secondary | ICD-10-CM | POA: Diagnosis not present

## 2019-08-17 DIAGNOSIS — I428 Other cardiomyopathies: Secondary | ICD-10-CM | POA: Diagnosis not present

## 2019-08-17 DIAGNOSIS — I6330 Cerebral infarction due to thrombosis of unspecified cerebral artery: Secondary | ICD-10-CM

## 2019-08-17 NOTE — Patient Instructions (Addendum)
Ben Chasnis: [facility] at [phone]    Medication Instructions:  No changes  If you need a refill on your cardiac medications before your next appointment, please call your pharmacy.    Lab work: No new labs needed   If you have labs (blood work) drawn today and your tests are completely normal, you will receive your results only by: Marland Kitchen MyChart Message (if you have MyChart) OR . A paper copy in the mail If you have any lab test that is abnormal or we need to change your treatment, we will call you to review the results.   Testing/Procedures: No new testing needed   Follow-Up: At [facility], you and your health needs are our priority.  As part of our continuing mission to provide you with exceptional heart care, we have created designated Provider Care Teams.  These Care Teams include your primary Cardiologist (physician) and Advanced Practice Providers (APPs -  Physician Assistants and Nurse Practitioners) who all work together to provide you with the care you need, when you need it.  . You will need a follow up appointment in 12 months   . Providers on your designated Care Team:   . Murray Hodgkins, NP . Christell Faith, PA-C . Marrianne Mood, PA-C  Any Other Special Instructions Will Be Listed Below (If Applicable).  For educational health videos Log in to : [email or web address] Or : [email or web address], password : [email or web address]

## 2019-08-20 DIAGNOSIS — R69 Illness, unspecified: Secondary | ICD-10-CM | POA: Diagnosis not present

## 2019-08-27 DIAGNOSIS — Z95810 Presence of automatic (implantable) cardiac defibrillator: Secondary | ICD-10-CM | POA: Diagnosis not present

## 2019-09-02 ENCOUNTER — Other Ambulatory Visit: Payer: Self-pay | Admitting: Family Medicine

## 2019-09-02 NOTE — Telephone Encounter (Signed)
Name of [place] Name of [place] or Written Date and Quantity:04/01/19#60 tabs with 0 refills Last Office Visit and Type:CPE/AWV 06/15/19 Next Office Visit and Type:CPE/AWV 06/16/20

## 2019-09-04 ENCOUNTER — Telehealth: Payer: Self-pay

## 2019-09-04 DIAGNOSIS — I10 Essential (primary) hypertension: Secondary | ICD-10-CM

## 2019-09-04 DIAGNOSIS — K219 Gastro-esophageal reflux disease without esophagitis: Secondary | ICD-10-CM

## 2019-09-04 NOTE — Telephone Encounter (Signed)
I would like to request a referral for Morgan Hubbard to chronic care management pharmacy services for the following conditions:   Essential hypertension, benign  [I10]  GERD [K21.9]  [name], PharmD Clinical Pharmacist [facility] at [facility] [phone]

## 2019-09-04 NOTE — Chronic Care Management (AMB) (Signed)
Chronic Care Management Pharmacy  Name: Morgan Hubbard  MRN: [id number] DOB: [birth year]  Chief Complaint/ HPI  Morgan Hubbard,  85 y.o., female presents for their Initial CCM visit with the clinical pharmacist via telephone.  PCP : Abner Greenspan, MD   Their chronic conditions include: hypertension, systolic heart failure, GERD, hyperlipidemia, type 2 diabetes, neuropathy, osteopenia, overactive bladder, renal insufficiency, chronic pain  Patient concerns: denies medication concerns or medication changes in the last 2-3 months   Office Visits:  06/15/19: [name] - continue current medications, k+ mildly elevated, baseline for pt, minimize high K foods  Consult Visit:  08/17/19: Cardiology - continue current meds, discuss aspirin dose with neurology  07/16/19: Endocrinology - note not available  [facility] - pacemaker battery needs to be replaced in 7 months  Allergies  Allergen Reactions  . Ace Inhibitors Swelling    REACTION: tongue swelling  . Codeine Other (See Comments)    REACTION: nausea  . Hydrochlorothiazide Other (See Comments)    unknown  . Insulin Detemir Itching  . Lisinopril Swelling    Swelling of tongue  . Nsaids Other (See Comments)    Unable to take D/T kidney issues  . Tylenol [Acetaminophen] Other (See Comments)    Pt has one kidney and says she can't take it   Medications: Outpatient Encounter Medications as of 09/06/2019  Medication Sig Note  . aspirin 325 MG tablet Take 325 mg by mouth daily.   Marland Kitchen atorvastatin (LIPITOR) 80 MG tablet Take 80 mg by mouth daily.   . Biotin w/ Vitamins C & E (HAIR/SKIN/NAILS PO) Take by mouth.   . carvedilol (COREG) 12.5 MG tablet Take 1 tablet (12.5 mg total) by mouth 2 (two) times daily with a meal.   . cetirizine (ZYRTEC) 10 MG tablet Take 10 mg by mouth 2 (two) times daily.   . clotrimazole-betamethasone (LOTRISONE) cream Apply 1 application topically as needed (groin).    . dapsone 25 MG tablet Take 25 mg by mouth daily.      . Ferrous Sulfate (IRON PO) Take 1 tablet by mouth 2 (two) times a day. 200mg  tab   . gabapentin (NEURONTIN) 300 MG capsule Take 1 capsule (300 mg total) by mouth 3 (three) times daily.   Marland Kitchen glimepiride (AMARYL) 2 MG tablet Take 1 tablet by mouth daily.   Marland Kitchen glucose blood (ONE TOUCH ULTRA TEST) test strip Use 3 (three) times daily.   . hydrALAZINE (APRESOLINE) 50 MG tablet Take 1 tablet by mouth 2 (two) times daily. 01/02/2016: Received from: [facility]: Take by mouth 2 (two) times daily.  . isosorbide mononitrate (IMDUR) 30 MG 24 hr tablet Take 1 tablet (30 mg total) by mouth daily.   Marland Kitchen letrozole (FEMARA) 2.5 MG tablet Take 2.5 mg by mouth daily.   Marland Kitchen losartan-hydrochlorothiazide (HYZAAR) 50-12.5 MG tablet Take 1 tablet by mouth daily.   . metFORMIN (GLUCOPHAGE-XR) 500 MG 24 hr tablet Take 1,000 mg by mouth 2 (two) times daily.   Marland Kitchen MILK THISTLE PO Take 1 capsule by mouth 2 (two) times daily.    . mirabegron ER ([name]) 50 MG TB24 tablet Take 50 mg by mouth daily.   . nitroGLYCERIN (NITROSTAT) 0.4 MG SL tablet Place 0.4 mg under the tongue every 5 (five) minutes as needed for chest pain.   . pantoprazole (PROTONIX) 40 MG tablet Take 1 tablet (40 mg total) by mouth daily.   . predniSONE (DELTASONE) 10 MG tablet TAKE 1 TABLET  BY MOUTH ONCE DAILY AS DIRECTED FOR [facility]   . traMADol (ULTRAM) 50 MG tablet TAKE 1 TO 2 TABLETS BY MOUTH EVERY 8 HOURS AS NEEDED FOR  MODERTE  TO  SEVERE  PAIN   . vitamin B-12 (CYANOCOBALAMIN) 1000 MCG tablet Take 1,000 mcg by mouth daily.    . Vitamin D, Ergocalciferol, (DRISDOL) 1.25 MG (50000 UT) CAPS capsule Take 1 capsule by mouth once a week.    No facility-administered encounter medications on file as of 09/06/2019.   Current Diagnosis/Assessment:   Emergency planning/management officer Strain: Low Risk   . Difficulty of Paying Living Expenses: Not very hard    Goals    . Increase water intake     Starting 04/22/2016, I will attempt to drink at  least 8 oz of water with each meal daily.     . Patient Stated     06/10/2019, I will maintain and continue medications as prescribed.     . Pharmacy Care Plan     CARE PLAN ENTRY  Current Barriers:  . Chronic Disease Management support, education, and care coordination needs related to diabetes, hypertension, hyperlipidemia, neuropathy  Pharmacist Clinical Goal(s):  Marland Kitchen Over the next 3 months, patient will work with PharmD and primary care provider to address the following goals: o Back pain: Improve pain severity and frequency  o Diabetes: Maintain A1c goal of less than 7% with fasting blood glucose within 80-130 mg/dL and blood glucose 1-2 hours after meals less than 180 mg/dL; prevent hypoglycemia (blood glucose less than 70 mg/dL) o Hypertension: Maintain blood pressure goal of less than 140/90 mmHg o Cholesterol: Maintain LDL cholesterol within goal of less than 70 mg/dL o Vaccinations: Remain up to date on vaccinations. Recommend 2-dose series of shingles vaccine  (Shingrix) and updating tetanus vaccine (Tdap).   Interventions: . Comprehensive medication review performed . Offered pharmacy coordination, adherence packaging, and delivery services  Patient Self Care Activities:  For the next 3 months until follow up visit:  . Continue medications daily as prescribed  . Continue to monitor blood glucose daily; Call if any readings less than 70 mg/dL . Continue to monitor blood pressure with symptoms and once monthly, document, and bring written log to appointments . May try over the counter lidocaine patches or diclofenac gel for back pain . Check with your pharmacy for shingles and tetanus vaccine  Initial goal documentation      Diabetes   Recent Relevant Labs:  A1c: 6.0% (per patient report) Lab Results  Component Value Date/Time   HGBA1C endo 01/03/2019 12:00 AM   HGBA1C 7.7 (H) 05/02/2017 09:40 AM   HGBA1C 9.0 (H) 04/22/2016 02:55 PM   MICROALBUR 78.0 (H) 04/22/2016  02:55 PM    A1c goal < 7% Checking BG: daily before breakfast Hypoglycemia: denies  Recent FBG Readings: 120-140  Patient has failed these meds in past: none reported Patient is currently controlled on the following medications:   Glimepiride 2 mg - 1 tablet daily   Metformin 500 mg - 2 tablets twice daily   Last diabetic eye exam:  Lab Results  Component Value Date/Time   HMDIABEYEEXA No Retinopathy 01/22/2019 12:00 AM    Last diabetic foot exam: reports checked at every office visit, no concerns  Exercise: denies, doesn't have much stamina anymore  Plan: Continue current medications   Heart Failure   Type: Systolic Last ejection fraction: 06/2018 40-45% NYHA Class: II (slight limitation of activity) AHA HF Stage: C (Heart disease and symptoms present)  Patient has failed these meds in past: none reported Patient is currently controlled on the following medications:   Carvedilol 12.5 mg - 1 tablet BID  Losartan-HCTZ 50-12.5 mg - 1 tablet daily  We discussed: some SOB, but stable, no swelling in legs  Plan: Continue current medications  Hypertension   CMP Latest Ref Rng & Units 06/10/2019 06/30/2018 06/22/2018  Glucose 70 - 99 mg/dL 109(H) 146(H) 83  BUN 6 - 23 mg/dL 20 10 9   Creatinine 0.40 - 1.20 mg/dL 1.18 0.90 0.95  Sodium 135 - 145 mEq/L 143 145 148(H)  Potassium 3.5 - 5.1 mEq/L 5.4 No hemolysis seen(H) 5.0 5.6(H)  Chloride 96 - 112 mEq/L 107 107 108  CO2 19 - 32 mEq/L 30 31 34(H)  Calcium 8.4 - 10.5 mg/dL 9.6 8.9 8.8  Total Protein 6.0 - 8.3 g/dL 5.8(L) 6.0 5.9(L)  Total Bilirubin 0.2 - 1.2 mg/dL 0.7 1.0 1.1  Alkaline Phos 39 - 117 U/L 63 131(H) 162(H)  AST 0 - 37 U/L 22 23 25   ALT 0 - 35 U/L 17 13 16    Office blood pressures are: BP Readings from Last 3 Encounters:  08/17/19 120/60  06/15/19 134/60  02/02/19 118/60   BP goal < 140/90 mmHg Patient has failed these meds in the past: none reported Patient checks BP at home: occasionally Patient home  BP readings are ranging: reports usually within goal   Patient is currently controlled on the following medications:   Carvedilol 12.5 mg - 1 tablet BID  Hydralazine 50 mg - 1 tablet BID  Losartan-HCTZ 50-12.5 mg - 1 tablet daily  (Imdur) Isosorbide Mononitrate 30 mg - 1 tablet daily  We discussed: watches salt intake, avoids table salt; history of mild hyperkalemia - stable  Plan: Continue current medications; Monitor BP monthly and with symptoms, document, and bring to appts.   Hyperlipidemia   CBC Latest Ref Rng & Units 06/10/2019 06/30/2018 06/22/2018  WBC 4.0 - 10.5 K/uL 7.9 5.8 7.3  Hemoglobin 12.0 - 15.0 g/dL 12.3 10.9(L) 11.1(L)  Hematocrit 36.0 - 46.0 % 37.5 34.0(L) 33.5(L)  Platelets 150.0 - 400.0 K/uL 220.0 269.0 236.0   Lipid Panel     Component Value Date/Time   CHOL 110 06/10/2019 0913   TRIG 146.0 06/10/2019 0913   HDL 34.70 (L) 06/10/2019 0913   CHOLHDL 3 06/10/2019 0913   VLDL 29.2 06/10/2019 0913   LDLCALC 46 06/10/2019 0913   LDLDIRECT 77.0 04/22/2016 1455    LDL goal < 70 (CAD, stroke) Patient has failed these meds in past: none reported Patient is currently controlled on the following medications:   Atorvastatin 80 mg - 1 tablet daily  Aspirin 325 mg - 1 tablet daily (neurologist at [facility])  Nitroglycerin 0.4 mg SL - PRN (doesn't have rx)  We discussed: cholesterol within goal, CBC improved  Plan: Continue current medications  OAB   Followed by urologist Patient has failed these meds in past: none Patient is currently controlled on the following medications:   Mirabegron ER 50 mg - 1 tablet daily  We discussed: helps with urgency, still fairly frequent urination - at times more than every hour during the day, 1-2 times during the night  Plan: Continue current medications; Follow up with urology regarding urinary frequency concerns.  Neuropathy/Osteoarthritis    Symptoms: Nerve pain in arms/itching, back pain  Patient has failed these meds  in past: Vicodin - more effective  Patient is currently uncontrolled   on the following medications:   Gabapentin  300 mg - 1 capsule TID   Tramadol 50 mg - 1-2 tablets q8h PRN pain   We discussed: reports gabapentin for itching in arms/pain - helpful; denies any benefit from tramadol for back pain; back pain limits daily activities   Plan: Continue current medications; Recommend utilizing topical therapies such as lidocaine patch or diclofenac gel for back pain.   Osteopenia   No vitamin D level per chart Patient has failed these meds in past: none Patient is currently on the following medications:   Vitamin D 50,000 IU weekly (started by oncologist)  We discussed: recommended daily calcium intake 1200 mg - eats a lot of greens and turnips  Plan: Continue current medications  Renal Insufficiency/Hyperkalemia   Patient has failed these meds in past: none Patient is currently controlled on the following medications:   No pharmacotherapy  We discussed: renal function and potassium stable, limits potassium rich foods; avoids Tylenol and NSAIDs  Plan: No medication changes indicated  GERD   Patient has failed these meds in past: none Patient is currently controlled on the following medications:   Pantoprazole 40 mg - 1 tablet daily after breakfast  We discussed: takes after breakfast, avoids greasy foods, takes alka seltzer PRN   Plan: Continue current medications  Vaccines   Reviewed and discussed patient's vaccination history.    Immunization History  Administered Date(s) Administered  . Influenza,inj,Quad PF,6+ Mos 05/24/2015, 04/03/2016, 03/09/2018  . Influenza-Unspecified 03/29/2013, 04/11/2014, 04/03/2016, 03/17/2017  . Pneumococcal Conjugate-13 06/13/2014  . Pneumococcal Polysaccharide-23 01/14/2011   Plan: Recommended patient receive Influenza vaccine, Shingrix and Tdap. Reports received 2 doses of COVID-19 vaccine [employer]).   Medication  Management  Misc: Lotrisone cream, dapsone 25 mg - 1 daily for hives/nephrotic urticaria (dermatologist), letrozole 2.5 mg - breast cancer, prednisone 10 mg - short term for dizzy spell, vitamin B12 1000 mcg tablet - daily  OTCs: Hair, skin, and nails - 1 daily, cetirizine 10 mg daily, iron - BID, milk thistle - 1 tablet daily (liver)  Pharmacy/Benefits: [employer]   Adherence: pillbox - breakfast and dinner   Affordability: no concerns (uses GoodRx), usually does go into the donut hole in the fall   CCM Follow Up: 12/07/19 at 9:30 AM (telephone)   [name], PharmD Clinical Pharmacist [facility] at [facility] [phone]

## 2019-09-06 ENCOUNTER — Other Ambulatory Visit: Payer: Self-pay

## 2019-09-06 ENCOUNTER — Ambulatory Visit: Payer: Medicare HMO

## 2019-09-06 DIAGNOSIS — E1129 Type 2 diabetes mellitus with other diabetic kidney complication: Secondary | ICD-10-CM

## 2019-09-06 DIAGNOSIS — E785 Hyperlipidemia, unspecified: Secondary | ICD-10-CM

## 2019-09-06 DIAGNOSIS — I10 Essential (primary) hypertension: Secondary | ICD-10-CM

## 2019-09-06 DIAGNOSIS — G629 Polyneuropathy, unspecified: Secondary | ICD-10-CM

## 2019-09-06 DIAGNOSIS — E1169 Type 2 diabetes mellitus with other specified complication: Secondary | ICD-10-CM

## 2019-09-06 NOTE — Telephone Encounter (Signed)
Referral placed.

## 2019-09-06 NOTE — Patient Instructions (Addendum)
September 06, 2019  Dear Morgan Hubbard,  It was a pleasure meeting you during our initial appointment on September 04, 2019. Below is a summary of the goals we discussed and components of chronic care management. Please contact me anytime with questions or concerns.   Visit Information  Goals Addressed            This Visit's Progress   . Pharmacy Care Plan       CARE PLAN ENTRY  Current Barriers:  . Chronic Disease Management support, education, and care coordination needs related to diabetes, hypertension, hyperlipidemia, neuropathy  Pharmacist Clinical Goal(s):  Marland Kitchen Over the next 3 months, patient will work with PharmD and primary care provider to address the following goals: o Back pain: Improve pain severity and frequency  o Diabetes: Maintain A1c goal of less than 7% with fasting blood glucose within 80-130 mg/dL and blood glucose 1-2 hours after meals less than 180 mg/dL; prevent hypoglycemia (blood glucose less than 70 mg/dL) o Hypertension: Maintain blood pressure goal of less than 140/90 mmHg o Cholesterol: Maintain LDL cholesterol within goal of less than 70 mg/dL o Vaccinations: Remain up to date on vaccinations. Recommend 2-dose series of shingles vaccine  (Shingrix) and updating tetanus vaccine (Tdap).   Interventions: . Comprehensive medication review performed . Offered pharmacy coordination, adherence packaging, and delivery services  Patient Self Care Activities:  For the next 3 months until follow up visit:  . Continue medications daily as prescribed  . Continue to monitor blood glucose daily; Call if any readings less than 70 mg/dL . Continue to monitor blood pressure with symptoms and once monthly, document, and bring written log to appointments . May try over the counter lidocaine patches or diclofenac gel for back pain . Check with your pharmacy for shingles and tetanus vaccine  Initial goal documentation       [name] was given information about Chronic Care  Management services today including:  1. CCM service includes personalized support from designated clinical staff supervised by her physician, including individualized plan of care and coordination with other care providers 2. 24/7 contact phone numbers for assistance for urgent and routine care needs. 3. Standard insurance, coinsurance, copays and deductibles apply for chronic care management only during months in which we provide at least 20 minutes of these services. Most insurances cover these services at 100%, however patients may be responsible for any copay, coinsurance and/or deductible if applicable. This service may help you avoid the need for more expensive face-to-face services. 4. Only one practitioner may furnish and bill the service in a calendar month. 5. The patient may stop CCM services at any time (effective at the end of the month) by phone call to the office staff.  Patient agreed to services and verbal consent obtained.   The patient verbalized understanding of instructions provided today and agreed to receive a mailed copy of patient instruction and/or educational materials. Telephone follow up appointment with pharmacy team member scheduled for: 12/07/19 at 9:30 AM (telephone)   [name], PharmD Clinical Pharmacist [facility] at [facility] [phone]  Urinary Frequency, Adult Urinary frequency means urinating more often than usual. You may urinate every 1-2 hours even though you drink a normal amount of fluid and do not have a bladder infection or condition. Although you urinate more often than normal, the total amount of urine produced in a day is normal. With urinary frequency, you may have an urgent need to urinate often. The stress  and anxiety of needing to find a bathroom quickly can make this urge worse. This condition may go away on its own or you may need treatment at home. Home treatment may include bladder training, exercises, taking medicines,  or making changes to your diet. Follow these instructions at home: Bladder health   Keep a bladder diary if told by your health care provider. Keep track of: ? What you eat and drink. ? How often you urinate. ? How much you urinate.  Follow a bladder training program if told by your health care provider. This may include: ? Learning to delay going to the bathroom. ? Double urinating (voiding). This helps if you are not completely emptying your bladder. ? Scheduled voiding.  Do Kegel exercises as told by your health care provider. Kegel exercises strengthen the muscles that help control urination, which may help the condition. Eating and drinking  If told by your health care provider, make diet changes, such as: ? Avoiding caffeine. ? Drinking fewer fluids, especially alcohol. ? Not drinking in the evening. ? Avoiding foods or drinks that may irritate the bladder. These include coffee, tea, soda, artificial sweeteners, citrus, tomato-based foods, and chocolate. ? Eating foods that help prevent or ease constipation. Constipation can make this condition worse. Your health care provider may recommend that you:  Drink enough fluid to keep your urine pale yellow.  Take over-the-counter or prescription medicines.  Eat foods that are high in fiber, such as beans, whole grains, and fresh fruits and vegetables.  Limit foods that are high in fat and processed sugars, such as fried or sweet foods. General instructions  Take over-the-counter and prescription medicines only as told by your health care provider.  Keep all follow-up visits as told by your health care provider. This is important. Contact a health care provider if:  You start urinating more often.  You feel pain or irritation when you urinate.  You notice blood in your urine.  Your urine looks cloudy.  You develop a fever.  You begin vomiting. Get help right away if:  You are unable to urinate. Summary  Urinary  frequency means urinating more often than usual. With urinary frequency, you may urinate every 1-2 hours even though you drink a normal amount of fluid and do not have a bladder infection or other bladder condition.  Your health care provider may recommend that you keep a bladder diary, follow a bladder training program, or make dietary changes.  If told by your health care provider, do Kegel exercises to strengthen the muscles that help control urination.  Take over-the-counter and prescription medicines only as told by your health care provider.  Contact a health care provider if your symptoms do not improve or get worse. This information is not intended to replace advice given to you by your health care provider. Make sure you discuss any questions you have with your health care provider. Document Revised: 11/27/2017 Document Reviewed: 11/27/2017 Elsevier Patient Education  [place].

## 2019-09-22 DIAGNOSIS — D692 Other nonthrombocytopenic purpura: Secondary | ICD-10-CM | POA: Diagnosis not present

## 2019-09-22 DIAGNOSIS — L659 Nonscarring hair loss, unspecified: Secondary | ICD-10-CM | POA: Diagnosis not present

## 2019-10-01 ENCOUNTER — Telehealth: Payer: Self-pay

## 2019-10-01 MED ORDER — MIRABEGRON ER 50 MG PO TB24: 50.0000 mg | ORAL_TABLET | Freq: Every day | ORAL | 0 refills | Status: DC

## 2019-10-01 NOTE — Telephone Encounter (Signed)
Patient advised.

## 2019-10-01 NOTE — Telephone Encounter (Signed)
Please let her know I sent that to [place]

## 2019-10-01 NOTE — Telephone Encounter (Signed)
Patient would like to know if Dr. [name] could prescribe a 1 month supply of [name] 50 mg - 1 tablet daily to last until her follow up visit with urology on 10/25/19. Previous urologist retired and unable to get an appt sooner for refills.   [name], PharmD Clinical Pharmacist [facility] at [facility] [phone]

## 2019-10-06 DIAGNOSIS — R69 Illness, unspecified: Secondary | ICD-10-CM | POA: Diagnosis not present

## 2019-10-25 ENCOUNTER — Other Ambulatory Visit: Payer: Self-pay | Admitting: Family Medicine

## 2019-10-25 DIAGNOSIS — N3020 Other chronic cystitis without hematuria: Secondary | ICD-10-CM | POA: Diagnosis not present

## 2019-10-25 DIAGNOSIS — N3946 Mixed incontinence: Secondary | ICD-10-CM | POA: Diagnosis not present

## 2019-10-25 NOTE — Telephone Encounter (Signed)
Name of [place] Name of [place] or Written Date and Quantity:09/02/19#60 tabs with 0 refills Last Office Visit and Type:CPE/AWV 06/15/19 Next Office Visit and Type:CPE/AWV 06/16/20

## 2019-10-28 DIAGNOSIS — Z95810 Presence of automatic (implantable) cardiac defibrillator: Secondary | ICD-10-CM | POA: Diagnosis not present

## 2019-11-01 DIAGNOSIS — Z95810 Presence of automatic (implantable) cardiac defibrillator: Secondary | ICD-10-CM | POA: Diagnosis not present

## 2019-11-15 DIAGNOSIS — Z853 Personal history of malignant neoplasm of breast: Secondary | ICD-10-CM | POA: Diagnosis not present

## 2019-11-15 DIAGNOSIS — N6489 Other specified disorders of breast: Secondary | ICD-10-CM | POA: Diagnosis not present

## 2019-11-15 DIAGNOSIS — Z170 Estrogen receptor positive status [ER+]: Secondary | ICD-10-CM | POA: Diagnosis not present

## 2019-11-15 DIAGNOSIS — C50211 Malignant neoplasm of upper-inner quadrant of right female breast: Secondary | ICD-10-CM | POA: Diagnosis not present

## 2019-11-15 DIAGNOSIS — G8929 Other chronic pain: Secondary | ICD-10-CM | POA: Diagnosis not present

## 2019-11-15 DIAGNOSIS — M545 Low back pain: Secondary | ICD-10-CM | POA: Diagnosis not present

## 2019-11-15 DIAGNOSIS — Z1379 Encounter for other screening for genetic and chromosomal anomalies: Secondary | ICD-10-CM | POA: Diagnosis not present

## 2019-11-15 DIAGNOSIS — Z1231 Encounter for screening mammogram for malignant neoplasm of breast: Secondary | ICD-10-CM | POA: Diagnosis not present

## 2019-11-15 DIAGNOSIS — C50311 Malignant neoplasm of lower-inner quadrant of right female breast: Secondary | ICD-10-CM | POA: Diagnosis not present

## 2019-11-15 LAB — HM MAMMOGRAPHY

## 2019-11-17 DIAGNOSIS — L650 Telogen effluvium: Secondary | ICD-10-CM | POA: Diagnosis not present

## 2019-11-17 DIAGNOSIS — L821 Other seborrheic keratosis: Secondary | ICD-10-CM | POA: Diagnosis not present

## 2019-11-17 DIAGNOSIS — L508 Other urticaria: Secondary | ICD-10-CM | POA: Diagnosis not present

## 2019-11-22 DIAGNOSIS — R3915 Urgency of urination: Secondary | ICD-10-CM | POA: Diagnosis not present

## 2019-11-22 DIAGNOSIS — M6281 Muscle weakness (generalized): Secondary | ICD-10-CM | POA: Diagnosis not present

## 2019-11-22 DIAGNOSIS — N3946 Mixed incontinence: Secondary | ICD-10-CM | POA: Diagnosis not present

## 2019-11-22 DIAGNOSIS — M6289 Other specified disorders of muscle: Secondary | ICD-10-CM | POA: Diagnosis not present

## 2019-11-26 DIAGNOSIS — M8949 Other hypertrophic osteoarthropathy, multiple sites: Secondary | ICD-10-CM | POA: Diagnosis not present

## 2019-11-26 DIAGNOSIS — M720 Palmar fascial fibromatosis [Dupuytren]: Secondary | ICD-10-CM | POA: Diagnosis not present

## 2019-11-26 DIAGNOSIS — G5603 Carpal tunnel syndrome, bilateral upper limbs: Secondary | ICD-10-CM | POA: Diagnosis not present

## 2019-11-26 DIAGNOSIS — M65342 Trigger finger, left ring finger: Secondary | ICD-10-CM | POA: Diagnosis not present

## 2019-11-26 DIAGNOSIS — M65341 Trigger finger, right ring finger: Secondary | ICD-10-CM | POA: Diagnosis not present

## 2019-11-27 DIAGNOSIS — R69 Illness, unspecified: Secondary | ICD-10-CM | POA: Diagnosis not present

## 2019-11-28 DIAGNOSIS — Z95810 Presence of automatic (implantable) cardiac defibrillator: Secondary | ICD-10-CM | POA: Diagnosis not present

## 2019-11-29 DIAGNOSIS — L508 Other urticaria: Secondary | ICD-10-CM | POA: Diagnosis not present

## 2019-11-29 DIAGNOSIS — L650 Telogen effluvium: Secondary | ICD-10-CM | POA: Diagnosis not present

## 2019-12-02 ENCOUNTER — Telehealth: Payer: Self-pay

## 2019-12-02 NOTE — Telephone Encounter (Signed)
If she is out of town and symptoms persist or worsen- recommend ER or urgent care  (ER if severe) Watch closely for stroke symptoms like weakness or facial droop or speech problem

## 2019-12-02 NOTE — Telephone Encounter (Signed)
[place] [facility] - Client TELEPHONE ADVICE RECORD [facility] Patient Name: Morgan Hubbard Gender: Female DOB: 11/23/1934 Age: 85 Y 11 M 23 D Return Phone Number: [phone] (Primary), [phone] (Secondary) Address: City/State/Zip: [place] [facility] - Client Client Site [place], Roque Lias - MD Contact Type Call Who Is Calling Patient / Member / Family / Caregiver Call Type Triage / Clinical Relationship To Patient Self Return Phone Number [phone] (Primary) Chief Complaint Neck Pain Reason for Call Symptomatic / Request for Morgan Hubbard states she has head pain, tingling for past month, strange sensation, and wondering what to do. [place] Not Theatre manager ER (out of town) Translation No Nurse Assessment Nurse: Vallery Sa, Therapist, sports, International aid/development worker Date/Time (Eastern Time): 12/02/2019 12:22:51 PM Confirm and document reason for call. If symptomatic, describe symptoms. ---Morgan Hubbard states that she developed neck pain with numbness that goes into her scalp about a month ago that she rates as 6 on the 1 to 10 scale today. No injury. No fever. Alert and responsive. Has the patient had close contact with a person known or suspected to have the novel coronavirus illness OR traveled / lives in area with major community spread (including international travel) in the last 14 days from the onset of symptoms? * If Asymptomatic, screen for exposure and travel within the last 14 days. ---No Does the patient have any new or worsening symptoms? ---Yes Will a triage be completed? ---Yes Related visit to physician within the last 2 weeks? ---No Does the PT have any chronic conditions? (i.e. diabetes, asthma, this includes High risk factors for pregnancy, etc.) ---Yes List chronic conditions. ---Defibrillator, Pacemaker, Breast Cancer Is this a behavioral health or  substance abuse call? ---No Guidelines Guideline Title Affirmed Question Affirmed Notes Nurse Date/Time Eilene Ghazi Time) Neck Pain or Stiffness [1] Stiff neck (can't put chin to chest) AND [2] headache [place], RN, Tye [place] 12/02/2019 12:27:06 PM Disp. Time Eilene Ghazi Time) Disposition Final User PLEASE NOTE: All timestamps contained within this report are represented as [place] Standard Time. CONFIDENTIALTY NOTICE: This fax transmission is intended only for the addressee. It contains information that is legally privileged, confidential or otherwise protected from use or disclosure. If you are not the intended recipient, you are strictly prohibited from reviewing, disclosing, copying using or disseminating any of this information or taking any action in reliance on or regarding this information. If you have received this fax in error, please notify us immediately by telephone so that we can arrange for its return to [place]. Phone: [phone], Toll-Free: [phone], Fax: [phone] Page: 2 of 2 Call Id: [id number] 12/02/2019 12:30:05 PM Go to ED Now Yes Vallery Sa, RN, [place]y [place] Disagree/Comply Comply Caller Understands Yes PreDisposition Call Doctor Care Advice Given Per Guideline GO TO ED NOW: * You need to be seen in the Emergency Department. * Go to the ED at ___________ [place] now. Drive carefully. NOTE TO TRIAGER - DRIVING: * Another adult should drive. * If immediate transportation is not available via car or taxi, then the patient should be instructed to call [id number]. CARE ADVICE given per Neck Pain (Adult) guideline. Referrals GO TO FACILITY OTHER - SPECIFY

## 2019-12-03 NOTE — Telephone Encounter (Signed)
I left VM for patient to return phone call.   

## 2019-12-07 ENCOUNTER — Telehealth: Payer: Self-pay

## 2019-12-07 ENCOUNTER — Telehealth: Payer: Medicare HMO

## 2019-12-07 NOTE — Chronic Care Management (AMB) (Deleted)
Chronic Care Management Pharmacy  Name: Morgan Hubbard  MRN: [id number] DOB: 1934-11-05  Chief Complaint/ HPI  Morgan Hubbard,  85 y.o., female presents for their Initial CCM visit with the clinical pharmacist via telephone.  PCP : Abner Greenspan, MD   Their chronic conditions include: hypertension, systolic heart failure, GERD, hyperlipidemia, type 2 diabetes, neuropathy, osteopenia, overactive bladder, renal insufficiency, chronic pain  Patient concerns: denies medication concerns or medication changes in the last 2-3 months   Office Visits:  06/15/19: [name] - continue current medications, k+ mildly elevated, baseline for pt, minimize high K foods  Consult Visit:  08/17/19: Cardiology - continue current meds, discuss aspirin dose with neurology  07/16/19: Endocrinology - note not available  [facility] - pacemaker battery needs to be replaced in 7 months  Allergies  Allergen Reactions   Ace Inhibitors Swelling    REACTION: tongue swelling   Codeine Other (See Comments)    REACTION: nausea   Hydrochlorothiazide Other (See Comments)    unknown   Insulin Detemir Itching   Lisinopril Swelling    Swelling of tongue   Nsaids Other (See Comments)    Unable to take D/T kidney issues   Tylenol [Acetaminophen] Other (See Comments)    Pt has one kidney and says she can't take it   Medications: Outpatient Encounter Medications as of 12/07/2019  Medication Sig Note   aspirin 325 MG tablet Take 325 mg by mouth daily.    atorvastatin (LIPITOR) 80 MG tablet Take 80 mg by mouth daily.    Biotin w/ Vitamins C & E (HAIR/SKIN/NAILS PO) Take by mouth.    carvedilol (COREG) 12.5 MG tablet Take 1 tablet (12.5 mg total) by mouth 2 (two) times daily with a meal.    cetirizine (ZYRTEC) 10 MG tablet Take 10 mg by mouth 2 (two) times daily.    clotrimazole-betamethasone (LOTRISONE) cream Apply 1 application topically as needed (groin).     dapsone 25 MG tablet Take 25 mg by mouth daily.       Ferrous Sulfate (IRON PO) Take 1 tablet by mouth 2 (two) times a day. 200mg  tab    gabapentin (NEURONTIN) 300 MG capsule Take 1 capsule (300 mg total) by mouth 3 (three) times daily.    glimepiride (AMARYL) 2 MG tablet Take 1 tablet by mouth daily.    glucose blood (ONE TOUCH ULTRA TEST) test strip Use 3 (three) times daily.    hydrALAZINE (APRESOLINE) 50 MG tablet Take 1 tablet by mouth 2 (two) times daily. 01/02/2016: Received from: [facility]: Take by mouth 2 (two) times daily.   isosorbide mononitrate (IMDUR) 30 MG 24 hr tablet Take 1 tablet (30 mg total) by mouth daily.    letrozole (FEMARA) 2.5 MG tablet Take 2.5 mg by mouth daily.    losartan-hydrochlorothiazide (HYZAAR) 50-12.5 MG tablet Take 1 tablet by mouth daily.    metFORMIN (GLUCOPHAGE-XR) 500 MG 24 hr tablet Take 1,000 mg by mouth 2 (two) times daily.    MILK THISTLE PO Take 1 capsule by mouth 2 (two) times daily.     mirabegron ER ([name]) 50 MG TB24 tablet Take 1 tablet (50 mg total) by mouth daily.    nitroGLYCERIN (NITROSTAT) 0.4 MG SL tablet Place 0.4 mg under the tongue every 5 (five) minutes as needed for chest pain.    pantoprazole (PROTONIX) 40 MG tablet Take 1 tablet (40 mg total) by mouth daily.    predniSONE (DELTASONE) 10 MG tablet  TAKE 1 TABLET BY MOUTH ONCE DAILY AS DIRECTED FOR [employer]    traMADol (ULTRAM) 50 MG tablet TAKE 1 TO 2 TABLETS BY MOUTH EVERY 8 HOURS AS NEEDED FOR  MODERATE  TO  SEVERE  PAIN    vitamin B-12 (CYANOCOBALAMIN) 1000 MCG tablet Take 1,000 mcg by mouth daily.     Vitamin D, Ergocalciferol, (DRISDOL) 1.25 MG (50000 UT) CAPS capsule Take 1 capsule by mouth once a week.    No facility-administered encounter medications on file as of 12/07/2019.   Current Diagnosis/Assessment:   Emergency planning/management officer Strain: Low Risk    Difficulty of Paying Living Expenses: Not very hard    Goals     Increase water intake     Starting 04/22/2016, I will  attempt to drink at least 8 oz of water with each meal daily.      Patient Stated     06/10/2019, I will maintain and continue medications as prescribed.      Pharmacy Care Plan     CARE PLAN ENTRY  Current Barriers:   Chronic Disease Management support, education, and care coordination needs related to diabetes, hypertension, hyperlipidemia, neuropathy  Pharmacist Clinical Goal(s):   Over the next 3 months, patient will work with PharmD and primary care provider to address the following goals: o Back pain: Improve pain severity and frequency  o Diabetes: Maintain A1c goal of less than 7% with fasting blood glucose within 80-130 mg/dL and blood glucose 1-2 hours after meals less than 180 mg/dL; prevent hypoglycemia (blood glucose less than 70 mg/dL) o Hypertension: Maintain blood pressure goal of less than 140/90 mmHg o Cholesterol: Maintain LDL cholesterol within goal of less than 70 mg/dL o Vaccinations: Remain up to date on vaccinations. Recommend 2-dose series of shingles vaccine  (Shingrix) and updating tetanus vaccine (Tdap).   Interventions:  Comprehensive medication review performed  Offered pharmacy coordination, adherence packaging, and delivery services  Patient Self Care Activities:  For the next 3 months until follow up visit:   Continue medications daily as prescribed   Continue to monitor blood glucose daily; Call if any readings less than 70 mg/dL  Continue to monitor blood pressure with symptoms and once monthly, document, and bring written log to appointments  May try over the counter lidocaine patches or diclofenac gel for back pain  Check with your pharmacy for shingles and tetanus vaccine  Initial goal documentation      Diabetes   Recent Relevant Labs:  A1c: 6.0% (per patient report) Lab Results  Component Value Date/Time   HGBA1C endo 01/03/2019 12:00 AM   HGBA1C 7.7 (H) 05/02/2017 09:40 AM   HGBA1C 9.0 (H) 04/22/2016 02:55 PM   MICROALBUR  78.0 (H) 04/22/2016 02:55 PM    A1c goal < 7% Checking BG: daily before breakfast Hypoglycemia: denies  Recent FBG Readings: 120-140  Patient has failed these meds in past: none reported Patient is currently controlled on the following medications:   Glimepiride 2 mg - 1 tablet daily   Metformin 500 mg - 2 tablets twice daily   Last diabetic eye exam:  Lab Results  Component Value Date/Time   HMDIABEYEEXA No Retinopathy 01/22/2019 12:00 AM    Last diabetic foot exam: reports checked at every office visit, no concerns  Exercise: denies, doesn't have much stamina anymore  Plan: Continue current medications   Heart Failure   Type: Systolic Last ejection fraction: 06/2018 40-45% NYHA Class: II (slight limitation of activity) AHA HF Stage: C (Heart disease  and symptoms present)  Patient has failed these meds in past: none reported Patient is currently controlled on the following medications:   Carvedilol 12.5 mg - 1 tablet BID  Losartan-HCTZ 50-12.5 mg - 1 tablet daily  We discussed: some SOB, but stable, no swelling in legs  Plan: Continue current medications  Hypertension   CMP Latest Ref Rng & Units 06/10/2019 06/30/2018 06/22/2018  Glucose 70 - 99 mg/dL 109(H) 146(H) 83  BUN 6 - 23 mg/dL 20 10 9   Creatinine 0.40 - 1.20 mg/dL 1.18 0.90 0.95  Sodium 135 - 145 mEq/L 143 145 148(H)  Potassium 3.5 - 5.1 mEq/L 5.4 No hemolysis seen(H) 5.0 5.6(H)  Chloride 96 - 112 mEq/L 107 107 108  CO2 19 - 32 mEq/L 30 31 34(H)  Calcium 8.4 - 10.5 mg/dL 9.6 8.9 8.8  Total Protein 6.0 - 8.3 g/dL 5.8(L) 6.0 5.9(L)  Total Bilirubin 0.2 - 1.2 mg/dL 0.7 1.0 1.1  Alkaline Phos 39 - 117 U/L 63 131(H) 162(H)  AST 0 - 37 U/L 22 23 25   ALT 0 - 35 U/L 17 13 16    Office blood pressures are: BP Readings from Last 3 Encounters:  08/17/19 120/60  06/15/19 134/60  02/02/19 118/60   BP goal < 140/90 mmHg Patient has failed these meds in the past: none reported Patient checks BP at home:  occasionally Patient home BP readings are ranging: reports usually within goal   Patient is currently controlled on the following medications:   Carvedilol 12.5 mg - 1 tablet BID  Hydralazine 50 mg - 1 tablet BID  Losartan-HCTZ 50-12.5 mg - 1 tablet daily  (Imdur) Isosorbide Mononitrate 30 mg - 1 tablet daily  We discussed: watches salt intake, avoids table salt; history of mild hyperkalemia - stable  Plan: Continue current medications; Monitor BP monthly and with symptoms, document, and bring to appts.   Hyperlipidemia   CBC Latest Ref Rng & Units 06/10/2019 06/30/2018 06/22/2018  WBC 4.0 - 10.5 K/uL 7.9 5.8 7.3  Hemoglobin 12.0 - 15.0 g/dL 12.3 10.9(L) 11.1(L)  Hematocrit 36 - 46 % 37.5 34.0(L) 33.5(L)  Platelets 150 - 400 K/uL 220.0 269.0 236.0   Lipid Panel     Component Value Date/Time   CHOL 110 06/10/2019 0913   TRIG 146.0 06/10/2019 0913   HDL 34.70 (L) 06/10/2019 0913   CHOLHDL 3 06/10/2019 0913   VLDL 29.2 06/10/2019 0913   LDLCALC 46 06/10/2019 0913   LDLDIRECT 77.0 04/22/2016 1455    LDL goal < 70 (CAD, stroke) Patient has failed these meds in past: none reported Patient is currently controlled on the following medications:   Atorvastatin 80 mg - 1 tablet daily  Aspirin 325 mg - 1 tablet daily (neurologist at [facility])  Nitroglycerin 0.4 mg SL - PRN (doesn't have rx)  We discussed: cholesterol within goal, CBC improved  Plan: Continue current medications  OAB   Followed by urologist Patient has failed these meds in past: none Patient is currently controlled on the following medications:   Mirabegron ER 50 mg - 1 tablet daily  We discussed: helps with urgency, still fairly frequent urination - at times more than every hour during the day, 1-2 times during the night  Plan: Continue current medications; Follow up with urology regarding urinary frequency concerns.  Neuropathy/Osteoarthritis    Symptoms: Nerve pain in arms/itching, back pain  Patient  has failed these meds in past: Vicodin - more effective  Patient is currently uncontrolled   on the following  medications:   Gabapentin 300 mg - 1 capsule TID   Tramadol 50 mg - 1-2 tablets q8h PRN pain   We discussed: reports gabapentin for itching in arms/pain - helpful; denies any benefit from tramadol for back pain; back pain limits daily activities   Plan: Continue current medications; Recommend utilizing topical therapies such as lidocaine patch or diclofenac gel for back pain.   Osteopenia   No vitamin D level per chart Patient has failed these meds in past: none Patient is currently on the following medications:   Vitamin D 50,000 IU weekly (started by oncologist)  We discussed: recommended daily calcium intake 1200 mg - eats a lot of greens and turnips  Plan: Continue current medications  Renal Insufficiency/Hyperkalemia   Patient has failed these meds in past: none Patient is currently controlled on the following medications:   No pharmacotherapy  We discussed: renal function and potassium stable, limits potassium rich foods; avoids Tylenol and NSAIDs  Plan: No medication changes indicated  GERD   Patient has failed these meds in past: none Patient is currently controlled on the following medications:   Pantoprazole 40 mg - 1 tablet daily after breakfast  We discussed: takes after breakfast, avoids greasy foods, takes alka seltzer PRN   Plan: Continue current medications  Vaccines   Reviewed and discussed patient's vaccination history.    Immunization History  Administered Date(s) Administered   Influenza,inj,Quad PF,6+ Mos 05/24/2015, 04/03/2016, 03/09/2018   Influenza-Unspecified 03/29/2013, 04/11/2014, 04/03/2016, 03/17/2017   PFIZER SARS-COV-2 Vaccination 07/16/2019, 08/06/2019   Pneumococcal Conjugate-13 06/13/2014   Pneumococcal Polysaccharide-23 01/14/2011   Plan: Recommended patient receive Influenza vaccine, Shingrix and Tdap. Reports  received 2 doses of COVID-19 vaccine [employer]).   Medication Management  Misc: Lotrisone cream, dapsone 25 mg - 1 daily for hives/nephrotic urticaria (dermatologist), letrozole 2.5 mg - breast cancer, prednisone 10 mg - short term for dizzy spell, vitamin B12 1000 mcg tablet - daily  OTCs: Hair, skin, and nails - 1 daily, cetirizine 10 mg daily, iron - BID, milk thistle - 1 tablet daily (liver)  Pharmacy/Benefits: [employer]   Adherence: pillbox - breakfast and dinner   Affordability: no concerns (uses GoodRx), usually does go into the donut hole in the fall   CCM Follow Up: 12/07/19 at 9:30 AM (telephone)   [name], PharmD Clinical Pharmacist [facility] at [facility] [phone]

## 2019-12-07 NOTE — Telephone Encounter (Signed)
  Chronic Care Management   Outreach Note  12/07/2019 Name: ANNALYNN CENTANNI MRN: [id number] DOB: 1934/12/16  PCP: Abner Greenspan, MD  Attempted to reach patient by telephone for follow up CCM visit on 12/07/19 at 9:30 AM. Unable to leave voicemail. Will contact for rescheduling.  [name], PharmD Clinical Pharmacist [facility] at [facility] [phone]

## 2019-12-08 DIAGNOSIS — R202 Paresthesia of skin: Secondary | ICD-10-CM | POA: Diagnosis not present

## 2019-12-08 DIAGNOSIS — M5481 Occipital neuralgia: Secondary | ICD-10-CM | POA: Diagnosis not present

## 2019-12-09 NOTE — Telephone Encounter (Signed)
Pt saw her neurologist yesterday to have sxs eval

## 2019-12-29 DIAGNOSIS — Z95810 Presence of automatic (implantable) cardiac defibrillator: Secondary | ICD-10-CM | POA: Diagnosis not present

## 2020-01-10 DIAGNOSIS — R809 Proteinuria, unspecified: Secondary | ICD-10-CM | POA: Diagnosis not present

## 2020-01-10 DIAGNOSIS — E785 Hyperlipidemia, unspecified: Secondary | ICD-10-CM | POA: Diagnosis not present

## 2020-01-10 DIAGNOSIS — E1129 Type 2 diabetes mellitus with other diabetic kidney complication: Secondary | ICD-10-CM | POA: Diagnosis not present

## 2020-01-10 DIAGNOSIS — E1169 Type 2 diabetes mellitus with other specified complication: Secondary | ICD-10-CM | POA: Diagnosis not present

## 2020-01-16 DIAGNOSIS — Z95810 Presence of automatic (implantable) cardiac defibrillator: Secondary | ICD-10-CM | POA: Diagnosis not present

## 2020-01-17 DIAGNOSIS — N1832 Chronic kidney disease, stage 3b: Secondary | ICD-10-CM | POA: Diagnosis not present

## 2020-01-17 DIAGNOSIS — E785 Hyperlipidemia, unspecified: Secondary | ICD-10-CM | POA: Diagnosis not present

## 2020-01-17 DIAGNOSIS — E1122 Type 2 diabetes mellitus with diabetic chronic kidney disease: Secondary | ICD-10-CM | POA: Diagnosis not present

## 2020-01-17 DIAGNOSIS — N1831 Chronic kidney disease, stage 3a: Secondary | ICD-10-CM | POA: Diagnosis not present

## 2020-01-17 DIAGNOSIS — E1169 Type 2 diabetes mellitus with other specified complication: Secondary | ICD-10-CM | POA: Diagnosis not present

## 2020-01-17 DIAGNOSIS — I10 Essential (primary) hypertension: Secondary | ICD-10-CM | POA: Diagnosis not present

## 2020-01-18 DIAGNOSIS — R69 Illness, unspecified: Secondary | ICD-10-CM | POA: Diagnosis not present

## 2020-01-24 DIAGNOSIS — I130 Hypertensive heart and chronic kidney disease with heart failure and stage 1 through stage 4 chronic kidney disease, or unspecified chronic kidney disease: Secondary | ICD-10-CM | POA: Diagnosis not present

## 2020-01-24 DIAGNOSIS — N189 Chronic kidney disease, unspecified: Secondary | ICD-10-CM | POA: Diagnosis not present

## 2020-01-24 DIAGNOSIS — I4892 Unspecified atrial flutter: Secondary | ICD-10-CM | POA: Diagnosis not present

## 2020-01-24 DIAGNOSIS — Z95810 Presence of automatic (implantable) cardiac defibrillator: Secondary | ICD-10-CM | POA: Diagnosis not present

## 2020-01-24 DIAGNOSIS — Z01810 Encounter for preprocedural cardiovascular examination: Secondary | ICD-10-CM | POA: Diagnosis not present

## 2020-01-24 DIAGNOSIS — E119 Type 2 diabetes mellitus without complications: Secondary | ICD-10-CM | POA: Diagnosis not present

## 2020-01-24 DIAGNOSIS — Y838 Other surgical procedures as the cause of abnormal reaction of the patient, or of later complication, without mention of misadventure at the time of the procedure: Secondary | ICD-10-CM | POA: Diagnosis not present

## 2020-01-24 DIAGNOSIS — I447 Left bundle-branch block, unspecified: Secondary | ICD-10-CM | POA: Diagnosis not present

## 2020-01-24 DIAGNOSIS — R9431 Abnormal electrocardiogram [ECG] [EKG]: Secondary | ICD-10-CM | POA: Diagnosis not present

## 2020-01-24 DIAGNOSIS — I428 Other cardiomyopathies: Secondary | ICD-10-CM | POA: Diagnosis not present

## 2020-01-24 DIAGNOSIS — T82111A Breakdown (mechanical) of cardiac pulse generator (battery), initial encounter: Secondary | ICD-10-CM | POA: Diagnosis not present

## 2020-01-24 DIAGNOSIS — I5022 Chronic systolic (congestive) heart failure: Secondary | ICD-10-CM | POA: Diagnosis not present

## 2020-01-24 DIAGNOSIS — I509 Heart failure, unspecified: Secondary | ICD-10-CM | POA: Diagnosis not present

## 2020-01-24 DIAGNOSIS — E1122 Type 2 diabetes mellitus with diabetic chronic kidney disease: Secondary | ICD-10-CM | POA: Diagnosis not present

## 2020-01-24 DIAGNOSIS — M549 Dorsalgia, unspecified: Secondary | ICD-10-CM | POA: Diagnosis not present

## 2020-01-26 DIAGNOSIS — Q600 Renal agenesis, unilateral: Secondary | ICD-10-CM | POA: Diagnosis not present

## 2020-01-26 DIAGNOSIS — D649 Anemia, unspecified: Secondary | ICD-10-CM | POA: Diagnosis not present

## 2020-01-26 DIAGNOSIS — N189 Chronic kidney disease, unspecified: Secondary | ICD-10-CM | POA: Diagnosis not present

## 2020-01-26 DIAGNOSIS — I129 Hypertensive chronic kidney disease with stage 1 through stage 4 chronic kidney disease, or unspecified chronic kidney disease: Secondary | ICD-10-CM | POA: Diagnosis not present

## 2020-01-26 DIAGNOSIS — K219 Gastro-esophageal reflux disease without esophagitis: Secondary | ICD-10-CM | POA: Diagnosis not present

## 2020-01-26 DIAGNOSIS — N183 Chronic kidney disease, stage 3 unspecified: Secondary | ICD-10-CM | POA: Diagnosis not present

## 2020-01-26 DIAGNOSIS — N179 Acute kidney failure, unspecified: Secondary | ICD-10-CM | POA: Diagnosis not present

## 2020-01-26 DIAGNOSIS — E1122 Type 2 diabetes mellitus with diabetic chronic kidney disease: Secondary | ICD-10-CM | POA: Diagnosis not present

## 2020-02-03 ENCOUNTER — Other Ambulatory Visit: Payer: Self-pay | Admitting: Family Medicine

## 2020-02-04 NOTE — Telephone Encounter (Signed)
Name of [place] Name of [place] or Written Date and Quantity:10/25/19#60 tabs with 0 refills Last Office Visit and Type:CPE/AWV 06/15/19 Next Office Visit and Type:f/u from surgery on 02/14/20 (CPE/AWV 06/16/20)

## 2020-02-14 ENCOUNTER — Encounter: Payer: Self-pay | Admitting: Family Medicine

## 2020-02-14 ENCOUNTER — Other Ambulatory Visit: Payer: Self-pay

## 2020-02-14 ENCOUNTER — Ambulatory Visit (INDEPENDENT_AMBULATORY_CARE_PROVIDER_SITE_OTHER): Payer: Medicare HMO | Admitting: Family Medicine

## 2020-02-14 VITALS — BP 128/70 | HR 79 | Temp 96.9°F | Ht 61.0 in | Wt 148.4 lb

## 2020-02-14 DIAGNOSIS — I10 Essential (primary) hypertension: Secondary | ICD-10-CM | POA: Diagnosis not present

## 2020-02-14 DIAGNOSIS — M5481 Occipital neuralgia: Secondary | ICD-10-CM | POA: Insufficient documentation

## 2020-02-14 DIAGNOSIS — N289 Disorder of kidney and ureter, unspecified: Secondary | ICD-10-CM | POA: Diagnosis not present

## 2020-02-14 DIAGNOSIS — Z95810 Presence of automatic (implantable) cardiac defibrillator: Secondary | ICD-10-CM

## 2020-02-14 DIAGNOSIS — Z23 Encounter for immunization: Secondary | ICD-10-CM | POA: Diagnosis not present

## 2020-02-14 NOTE — Progress Notes (Signed)
Subjective:    Patient ID: Morgan Hubbard, female    DOB: 10-31-1934, 85 y.o.   MRN: [id number]  This visit occurred during the SARS-CoV-2 public health emergency.  Safety protocols were in place, including screening questions prior to the visit, additional usage of staff PPE, and extensive cleaning of exam room while observing appropriate contact time as indicated for disinfecting solutions.    HPI  Pt presents for f/u of chronic health problems   Wt Readings from Last 3 Encounters:  02/14/20 148 lb 6 oz (67.3 kg)  08/17/19 148 lb 4 oz (67.2 kg)  06/15/19 146 lb 7 oz (66.4 kg)   28.04 kg/m  She is covid immunized Wants flu shot today   Had a biventricular cardioverter-defib placed at [facility] recently (generator change) H/o cardiomyopathy and CVA (small vessel)  Had it done on Monday last week and it was very tender (more pain than expected)  She has not checked in with her surgeon/cardiolgy   Still tender today  Hurts for water to hit it  No redness  No signs of infection     Dr Nancy Fetter put it in - but no f/u was scheduled   HTN  bp is stable today  No cp or palpitations or headaches or edema  No side effects to medicines  BP Readings from Last 3 Encounters:  02/14/20 128/70  08/17/19 120/60  06/15/19 134/60   Pulse Readings from Last 3 Encounters:  02/14/20 79  08/17/19 63  06/15/19 69      K was high here last time but 4.6 at [facility] end of aug Cr at that time was 1.4  She saw nephrologist last week  Was found to have a urinary infection (did get yeast)    Was diagnosed at [facility] with occipital neuralgia  tx with gabapentin   Diabetes-sees endocrinology and well controlled   Patient Active Problem List   Diagnosis Date Noted  . Occipital neuralgia 02/14/2020  . Hair loss 11/09/2018  . Leg weakness, bilateral 06/29/2018  . Pedal edema 06/22/2018  . AICD (automatic cardioverter/defibrillator) present 06/11/2018  . GERD (gastroesophageal reflux disease)  06/10/2018  . Cholecystitis 06/10/2018  . Tingling 10/06/2017  . Chronic back pain 05/23/2017  . Chronic systolic (congestive) heart failure ([place]) 02/25/2017  . Osteopenia 06/16/2016  . Routine general medical examination at a health care facility 04/24/2016  . Estrogen deficiency 04/24/2016  . Urticaria 11/03/2015  . Cerumen impaction 03/10/2015  . Encounter for Medicare annual wellness exam 06/13/2014  . Stroke, small vessel ([place]) 01/18/2014  . Family history of hemochromatosis 01/18/2014  . Cerebral thrombosis with cerebral infarction ([place]) 01/14/2014  . Expressive aphasia 01/13/2014  . Nonischemic cardiomyopathy ([place]) 04/05/2013  . Shortness of breath 09/03/2012  . Elevated transaminase level 05/18/2012  . Spinal stenosis of lumbar region 12/10/2011  . Mixed incontinence urge and stress 12/10/2011  . Renal insufficiency 11/04/2011  . Goiter 01/14/2011  . Fatty liver 08/28/2010  . Type II diabetes mellitus with renal manifestations ([place]) 07/09/2010  . Hyperlipidemia associated with type 2 diabetes mellitus ([place]) 07/09/2010  . HYPERKALEMIA 07/09/2010  . REFLEX SYMPATHETIC DYSTROPHY 07/09/2010  . Neuropathy 07/09/2010  . Essential hypertension 07/09/2010  . CARDIOMYOPATHY 07/09/2010  . OVERACTIVE BLADDER 07/09/2010   Past Medical History:  Diagnosis Date  . Arthritis    "hands" (01/13/2014)  . Basal cell carcinoma 01/2014   "bridge of nose"  . Cardiac LV ejection fraction >40%    "it was 43 last year" (01/13/2014)  .  Chronic lower back pain   . CKD (chronic kidney disease), stage III    acute on chronic stage III/notes 06/10/2018  . Colon polyps   . Expressive aphasia    "3 times in the last week" (01/13/2014)  . GERD (gastroesophageal reflux disease)   . Goiter past remote   treated with RI  . Hyperlipidemia   . Hyperpotassemia   . Hypertension   . Hypertonicity of bladder   . Inflammatory and toxic neuropathy, unspecified   . LBBB (left bundle branch block)   .  Migraine    "stopped many years ago" (01/13/2014)  . Mini stroke ([place]) 01/2014  . Other primary cardiomyopathies   . Overactive bladder   . Presence of combination internal cardiac defibrillator (ICD) and pacemaker   . Reflex sympathetic dystrophy, unspecified   . Shingles   . Shortness of breath    ambulation  . Stroke ([place])   . Thyroid disease   . Type II diabetes mellitus ([place])   . Ulcer   . Urine incontinence   . Vertigo    hx of   Past Surgical History:  Procedure Laterality Date  . BACK SURGERY    . BI-VENTRICULAR PACEMAKER INSERTION (CRT-P)  10/2014   [facility]  . BREAST CYST EXCISION Left 1959  . CARDIAC CATHETERIZATION  "several"   [place]  . CATARACT EXTRACTION W/ INTRAOCULAR LENS IMPLANT Left ~ 2005   several eye injections  . CHOLECYSTECTOMY N/A 06/13/2018   Procedure: LAPAROSCOPIC CHOLECYSTECTOMY;  Surgeon: Donnie Mesa, MD;  Location: [place];  Service: General;  Laterality: N/A;  . COLONOSCOPY  7/12   normal (hx of polyps in past)   . CT SCAN  3/12   outside hosp- lung nodule and gallstones  . ERCP N/A 06/12/2018   Procedure: ENDOSCOPIC RETROGRADE CHOLANGIOPANCREATOGRAPHY (ERCP);  Surgeon: Carol Ada, MD;  Location: [place];  Service: Endoscopy;  Laterality: N/A;  . [place]   "knot on index"  . KIDNEY DONATION Left 1989  . LUMBAR LAMINECTOMY/DECOMPRESSION MICRODISCECTOMY  1999  . LUMBAR LAMINECTOMY/DECOMPRESSION MICRODISCECTOMY  01/31/2012   Procedure: LUMBAR LAMINECTOMY/DECOMPRESSION MICRODISCECTOMY 2 LEVELS;  Surgeon: Eustace Moore, MD;  Location: [place] NEURO ORS;  Service: Neurosurgery;  Laterality: N/A;  Thoracic twelve-lumbar one, lumbar one-two laminectomy   . POSTERIOR LAMINECTOMY / DECOMPRESSION CERVICAL SPINE  1999  . REMOVAL OF STONES  06/12/2018   Procedure: REMOVAL OF STONES;  Surgeon: Carol Ada, MD;  Location: [facility] ENDOSCOPY;  Service: Endoscopy;;  . Joan Mayans  06/12/2018   Procedure: Joan Mayans;  Surgeon: Carol Ada, MD;  Location: [facility] ENDOSCOPY;  Service: Endoscopy;;  . [place]  . UPPER GASTROINTESTINAL ENDOSCOPY     Social History   Tobacco Use  . Smoking status: Never Smoker  . Smokeless tobacco: Never Used  Substance Use Topics  . Alcohol use: No    Alcohol/week: 0.0 standard drinks  . Drug use: No   Family History  Problem Relation Age of Onset  . Arthritis Mother   . Cancer Mother        uterine and mouth  . Hyperlipidemia Mother   . Stroke Mother   . Hypertension Mother   . Alcohol abuse Father   . Diabetes Father   . Cancer Sister        breast  . Diabetes Sister   . Cancer Brother        lung cancer  . Kidney disease Brother   . Diabetes Brother   .  Hyperlipidemia Sister   . Heart disease Sister   . Hypertension Sister   . Diabetes Sister   . Hyperlipidemia Brother   . Kidney failure Brother   . Diabetes Daughter   . Addison's disease Other    Allergies  Allergen Reactions  . Ace Inhibitors Swelling    REACTION: tongue swelling  . Codeine Other (See Comments)    REACTION: nausea  . Hydrochlorothiazide Other (See Comments)    unknown  . Insulin Detemir Itching  . Lisinopril Swelling    Swelling of tongue  . Nsaids Other (See Comments)    Unable to take D/T kidney issues  . Tylenol [Acetaminophen] Other (See Comments)    Pt has one kidney and says she can't take it   Current Outpatient Medications on File Prior to Visit  Medication Sig Dispense Refill  . aspirin 325 MG tablet Take 325 mg by mouth daily.    Marland Kitchen atorvastatin (LIPITOR) 80 MG tablet Take 80 mg by mouth daily.    . Biotin w/ Vitamins C & E (HAIR/SKIN/NAILS PO) Take by mouth.    . carvedilol (COREG) 12.5 MG tablet Take 1 tablet (12.5 mg total) by mouth 2 (two) times daily with a meal. 180 tablet 4  . cetirizine (ZYRTEC) 10 MG tablet Take 10 mg by mouth 2 (two) times daily.    . clotrimazole-betamethasone (LOTRISONE) cream Apply 1 application topically as needed (groin).     .  dapsone 25 MG tablet Take 25 mg by mouth daily.     . Ferrous Sulfate (IRON PO) Take 1 tablet by mouth 2 (two) times a day. 200mg  tab    . gabapentin (NEURONTIN) 300 MG capsule Take 1 capsule (300 mg total) by mouth 3 (three) times daily. 270 capsule 3  . glimepiride (AMARYL) 2 MG tablet Take 1 tablet by mouth daily.    Marland Kitchen glucose blood (ONE TOUCH ULTRA TEST) test strip Use 3 (three) times daily.    . hydrALAZINE (APRESOLINE) 50 MG tablet Take 1 tablet by mouth 2 (two) times daily.    . isosorbide mononitrate (IMDUR) 30 MG 24 hr tablet Take 1 tablet (30 mg total) by mouth daily. 90 tablet 4  . letrozole (FEMARA) 2.5 MG tablet Take 2.5 mg by mouth daily.    Marland Kitchen losartan-hydrochlorothiazide (HYZAAR) 50-12.5 MG tablet Take 1 tablet by mouth daily. 90 tablet 3  . metFORMIN (GLUCOPHAGE-XR) 500 MG 24 hr tablet Take 1,000 mg by mouth 2 (two) times daily.    Marland Kitchen MILK THISTLE PO Take 1 capsule by mouth 2 (two) times daily.     . mirabegron ER ([name]) 50 MG TB24 tablet Take 1 tablet (50 mg total) by mouth daily. 30 tablet 0  . nitroGLYCERIN (NITROSTAT) 0.4 MG SL tablet Place 0.4 mg under the tongue every 5 (five) minutes as needed for chest pain.    . pantoprazole (PROTONIX) 40 MG tablet Take 1 tablet (40 mg total) by mouth daily. 90 tablet 3  . predniSONE (DELTASONE) 10 MG tablet TAKE 1 TABLET BY MOUTH ONCE DAILY AS DIRECTED FOR FLARE UPS    . traMADol (ULTRAM) 50 MG tablet TAKE 1 TO 2 TABLETS BY MOUTH EVERY 8 HOURS AS NEEDED FOR MODERATE TO SEVERE PAIN 60 tablet 0  . vitamin B-12 (CYANOCOBALAMIN) 1000 MCG tablet Take 1,000 mcg by mouth daily.     . Vitamin D, Ergocalciferol, (DRISDOL) 1.25 MG (50000 UT) CAPS capsule Take 1 capsule by mouth once a week.     No current  facility-administered medications on file prior to visit.    Review of Systems  Constitutional: Negative for activity change, appetite change, fatigue, fever and unexpected weight change.  HENT: Negative for congestion, ear pain,  rhinorrhea, sinus pressure and sore throat.   Eyes: Negative for pain, redness and visual disturbance.  Respiratory: Negative for cough, shortness of breath and wheezing.   Cardiovascular: Negative for chest pain and palpitations.  Gastrointestinal: Negative for abdominal pain, blood in stool, constipation and diarrhea.  Endocrine: Negative for polydipsia and polyuria.  Genitourinary: Negative for dysuria, frequency and urgency.  Musculoskeletal: Negative for arthralgias, back pain and myalgias.  Skin: Positive for wound. Negative for pallor and rash.       Sore area from implanted device on L chest  Allergic/Immunologic: Negative for environmental allergies.  Neurological: Negative for dizziness, syncope and headaches.  Hematological: Negative for adenopathy. Does not bruise/bleed easily.  Psychiatric/Behavioral: Negative for decreased concentration and dysphoric mood. The patient is not nervous/anxious.        Objective:   Physical Exam Constitutional:      General: She is not in acute distress.    Appearance: Normal appearance. She is well-developed and normal weight. She is not ill-appearing or diaphoretic.  HENT:     Head: Normocephalic and atraumatic.  Eyes:     Conjunctiva/sclera: Conjunctivae normal.     Pupils: Pupils are equal, round, and reactive to light.  Neck:     Thyroid: No thyromegaly.     Vascular: No carotid bruit or JVD.  Cardiovascular:     Rate and Rhythm: Normal rate and regular rhythm.     Heart sounds: Normal heart sounds. No gallop.   Pulmonary:     Effort: Pulmonary effort is normal. No respiratory distress.     Breath sounds: Normal breath sounds. No wheezing or rales.  Abdominal:     General: Bowel sounds are normal. There is no distension or abdominal bruit.     Palpations: Abdomen is soft. There is no mass.     Tenderness: There is no abdominal tenderness.  Musculoskeletal:     Cervical back: Normal range of motion and neck supple.    Lymphadenopathy:     Cervical: No cervical adenopathy.  Skin:    General: Skin is warm and dry.     Coloration: Skin is not pale.     Findings: No erythema or rash.     Comments: Area of implanted cardiac device in L chest wall looks normal  Steri strips remaining (some trimmed)  No erythema or swelling  Still tender to the touch  Neurological:     Mental Status: She is alert.     Cranial Nerves: No cranial nerve deficit.     Sensory: No sensory deficit.     Coordination: Coordination normal.     Deep Tendon Reflexes: Reflexes are normal and symmetric. Reflexes normal.  Psychiatric:        Mood and Affect: Mood normal.           Assessment & Plan:   Problem List Items Addressed This Visit      Cardiovascular and Mediastinum   Essential hypertension    bp in fair control at this time  BP Readings from Last 1 Encounters:  02/14/20 128/70   No changes needed Most recent labs reviewed  Disc lifstyle change with low sodium diet and exercise          Genitourinary   Renal insufficiency    Pt had first visit  at [place] last week  Of note-last visit at [place] was 1.4 and K 4.6  Treated for uti (? Keflex)          Other   AICD (automatic cardioverter/defibrillator) present - Primary    Surgery was more difficult (tough recovery with pain and fatigue) than she thought last week  Starting to improve Site looks good (no signs of infection)  Trimmed a bit of loose steri strip Disc s/s to watch for and update surgeon if needed  F/u with cardiology is in oct      Occipital neuralgia    Diagnosed with this at [facility] tx with gabapentin Doing ok overall -note reviewed        Other Visit Diagnoses    Need for influenza vaccination       Relevant Orders   Flu Vaccine QUAD High Dose(Fluad) (Completed)

## 2020-02-14 NOTE — Patient Instructions (Addendum)
Flu shot today  Your surgical site looks good  If more pain or any redness or swelling- let your surgeon know  Continue to protect it   Keep taking good care of yourself

## 2020-02-14 NOTE — Assessment & Plan Note (Signed)
Pt had first visit at [place] last week  Of note-last visit at [facility] cr was 1.4 and K 4.6  Treated for uti (? Keflex)

## 2020-02-14 NOTE — Assessment & Plan Note (Signed)
Diagnosed with this at [facility] tx with gabapentin Doing ok overall -note reviewed

## 2020-02-14 NOTE — Assessment & Plan Note (Signed)
Surgery was more difficult (tough recovery with pain and fatigue) than she thought last week  Starting to improve Site looks good (no signs of infection)  Trimmed a bit of loose steri strip Disc s/s to watch for and update surgeon if needed  F/u with cardiology is in [place]

## 2020-02-14 NOTE — Assessment & Plan Note (Signed)
bp in fair control at this time  BP Readings from Last 1 Encounters:  02/14/20 128/70   No changes needed Most recent labs reviewed  Disc lifstyle change with low sodium diet and exercise

## 2020-02-24 DIAGNOSIS — N183 Chronic kidney disease, stage 3 unspecified: Secondary | ICD-10-CM | POA: Diagnosis not present

## 2020-02-24 DIAGNOSIS — N179 Acute kidney failure, unspecified: Secondary | ICD-10-CM | POA: Diagnosis not present

## 2020-03-03 DIAGNOSIS — Z95810 Presence of automatic (implantable) cardiac defibrillator: Secondary | ICD-10-CM | POA: Diagnosis not present

## 2020-03-04 DIAGNOSIS — Z95810 Presence of automatic (implantable) cardiac defibrillator: Secondary | ICD-10-CM | POA: Diagnosis not present

## 2020-03-31 DIAGNOSIS — H2511 Age-related nuclear cataract, right eye: Secondary | ICD-10-CM | POA: Diagnosis not present

## 2020-03-31 LAB — HM DIABETES EYE EXAM

## 2020-04-19 DIAGNOSIS — E119 Type 2 diabetes mellitus without complications: Secondary | ICD-10-CM | POA: Diagnosis not present

## 2020-04-19 DIAGNOSIS — H25011 Cortical age-related cataract, right eye: Secondary | ICD-10-CM | POA: Diagnosis not present

## 2020-05-02 ENCOUNTER — Other Ambulatory Visit
Admission: RE | Admit: 2020-05-02 | Discharge: 2020-05-02 | Disposition: A | Discharge status: Home / Self Care | Payer: Medicare HMO | Source: Ambulatory Visit | Attending: Ophthalmology | Admitting: Ophthalmology

## 2020-05-02 ENCOUNTER — Other Ambulatory Visit: Payer: Self-pay

## 2020-05-02 DIAGNOSIS — Z01812 Encounter for preprocedural laboratory examination: Secondary | ICD-10-CM | POA: Diagnosis not present

## 2020-05-02 DIAGNOSIS — Z20822 Contact with and (suspected) exposure to covid-19: Secondary | ICD-10-CM | POA: Diagnosis not present

## 2020-05-02 LAB — SARS CORONAVIRUS 2 (TAT 6-24 HRS): SARS Coronavirus 2: NEGATIVE

## 2020-05-03 MED ORDER — MOXIFLOXACIN HCL 0.5 % OP SOLN: 1.0000 [drp] | OPHTHALMIC | Status: DC | PRN

## 2020-05-03 MED ORDER — CYCLOPENTOLATE HCL 2 % OP SOLN
1.0000 [drp] | OPHTHALMIC | Status: AC | PRN
  Administered 2020-05-04 (×3): 1 [drp] via OPHTHALMIC

## 2020-05-03 MED ORDER — SODIUM CHLORIDE 0.9 % IV SOLN: INTRAVENOUS | Status: DC

## 2020-05-03 MED ORDER — TETRACAINE HCL 0.5 % OP SOLN
1.0000 [drp] | OPHTHALMIC | Status: AC
  Administered 2020-05-04: 1 [drp] via OPHTHALMIC

## 2020-05-03 MED ORDER — PHENYLEPHRINE HCL 10 % OP SOLN
1.0000 [drp] | OPHTHALMIC | Status: AC
  Administered 2020-05-04 (×3): 1 [drp] via OPHTHALMIC

## 2020-05-04 ENCOUNTER — Encounter
Admission: RE | Disposition: A | Discharge status: Home Health | Payer: Self-pay | Source: Home / Self Care | Attending: Ophthalmology

## 2020-05-04 ENCOUNTER — Ambulatory Visit
Admission: RE | Admit: 2020-05-04 | Discharge: 2020-05-04 | Disposition: A | Discharge status: Home Health | Payer: Medicare HMO | Attending: Ophthalmology | Admitting: Ophthalmology

## 2020-05-04 ENCOUNTER — Ambulatory Visit: Payer: Medicare HMO | Admitting: Urgent Care

## 2020-05-04 ENCOUNTER — Encounter: Payer: Self-pay | Admitting: Ophthalmology

## 2020-05-04 DIAGNOSIS — Z7982 Long term (current) use of aspirin: Secondary | ICD-10-CM | POA: Diagnosis not present

## 2020-05-04 DIAGNOSIS — E1122 Type 2 diabetes mellitus with diabetic chronic kidney disease: Secondary | ICD-10-CM | POA: Diagnosis not present

## 2020-05-04 DIAGNOSIS — E785 Hyperlipidemia, unspecified: Secondary | ICD-10-CM | POA: Diagnosis not present

## 2020-05-04 DIAGNOSIS — E1140 Type 2 diabetes mellitus with diabetic neuropathy, unspecified: Secondary | ICD-10-CM | POA: Diagnosis not present

## 2020-05-04 DIAGNOSIS — Z7984 Long term (current) use of oral hypoglycemic drugs: Secondary | ICD-10-CM | POA: Diagnosis not present

## 2020-05-04 DIAGNOSIS — N183 Chronic kidney disease, stage 3 unspecified: Secondary | ICD-10-CM | POA: Insufficient documentation

## 2020-05-04 DIAGNOSIS — Z79899 Other long term (current) drug therapy: Secondary | ICD-10-CM | POA: Insufficient documentation

## 2020-05-04 DIAGNOSIS — E1136 Type 2 diabetes mellitus with diabetic cataract: Secondary | ICD-10-CM | POA: Diagnosis present

## 2020-05-04 DIAGNOSIS — I5022 Chronic systolic (congestive) heart failure: Secondary | ICD-10-CM | POA: Diagnosis not present

## 2020-05-04 DIAGNOSIS — H2511 Age-related nuclear cataract, right eye: Secondary | ICD-10-CM | POA: Insufficient documentation

## 2020-05-04 DIAGNOSIS — H25011 Cortical age-related cataract, right eye: Secondary | ICD-10-CM | POA: Diagnosis not present

## 2020-05-04 DIAGNOSIS — I110 Hypertensive heart disease with heart failure: Secondary | ICD-10-CM | POA: Diagnosis not present

## 2020-05-04 HISTORY — PX: CATARACT EXTRACTION W/PHACO: SHX586

## 2020-05-04 SURGERY — PHACOEMULSIFICATION, CATARACT, WITH IOL INSERTION
Anesthesia: Monitor Anesthesia Care | Site: Eye | Laterality: Right

## 2020-05-04 MED ORDER — POVIDONE-IODINE 5 % OP SOLN
OPHTHALMIC | Status: AC
  Filled 2020-05-04: qty 120

## 2020-05-04 MED ORDER — FENTANYL CITRATE (PF) 100 MCG/2ML IJ SOLN
INTRAMUSCULAR | Status: AC
  Filled 2020-05-04: qty 2

## 2020-05-04 MED ORDER — PHENYLEPHRINE HCL (PRESSORS) 10 MG/ML IV SOLN
INTRAVENOUS | Status: AC
  Filled 2020-05-04: qty 1

## 2020-05-04 MED ORDER — LIDOCAINE HCL (PF) 4 % IJ SOLN
INTRAMUSCULAR | Status: AC
  Filled 2020-05-04: qty 5

## 2020-05-04 MED ORDER — LIDOCAINE HCL (PF) 4 % IJ SOLN
INTRAOCULAR | Status: DC | PRN
  Administered 2020-05-04: 4 mL via OPHTHALMIC

## 2020-05-04 MED ORDER — ARMC OPHTHALMIC DILATING DROPS
OPHTHALMIC | Status: AC
  Filled 2020-05-04: qty 0.5

## 2020-05-04 MED ORDER — CARBACHOL 0.01 % IO SOLN
INTRAOCULAR | Status: DC | PRN
  Administered 2020-05-04: 0.5 mL via INTRAOCULAR

## 2020-05-04 MED ORDER — MOXIFLOXACIN HCL 0.5 % OP SOLN
OPHTHALMIC | Status: AC
  Filled 2020-05-04: qty 3

## 2020-05-04 MED ORDER — EPINEPHRINE PF 1 MG/ML IJ SOLN: INTRAOCULAR | Status: DC | PRN

## 2020-05-04 MED ORDER — MOXIFLOXACIN HCL 0.5 % OP SOLN
OPHTHALMIC | Status: DC | PRN
  Administered 2020-05-04: 0.2 mL via OPHTHALMIC

## 2020-05-04 MED ORDER — ONDANSETRON HCL 4 MG/2ML IJ SOLN
INTRAMUSCULAR | Status: DC | PRN
  Administered 2020-05-04: 4 mg via INTRAVENOUS

## 2020-05-04 MED ORDER — ONDANSETRON HCL 4 MG/2ML IJ SOLN
INTRAMUSCULAR | Status: AC
  Filled 2020-05-04: qty 2

## 2020-05-04 MED ORDER — TETRACAINE HCL 0.5 % OP SOLN
OPHTHALMIC | Status: AC
  Administered 2020-05-04: 1 [drp] via OPHTHALMIC
  Filled 2020-05-04: qty 4

## 2020-05-04 MED ORDER — WHITE PETROLATUM EX OINT
TOPICAL_OINTMENT | CUTANEOUS | Status: AC
  Filled 2020-05-04: qty 5

## 2020-05-04 MED ORDER — GLYCOPYRROLATE 0.2 MG/ML IJ SOLN
INTRAMUSCULAR | Status: AC
  Filled 2020-05-04: qty 1

## 2020-05-04 MED ORDER — DEXMEDETOMIDINE HCL 200 MCG/2ML IV SOLN
INTRAVENOUS | Status: DC | PRN
  Administered 2020-05-04: 4 ug via INTRAVENOUS

## 2020-05-04 MED ORDER — NA CHONDROIT SULF-NA HYALURON 40-17 MG/ML IO SOLN
INTRAOCULAR | Status: DC | PRN
  Administered 2020-05-04: 1 mL via INTRAOCULAR

## 2020-05-04 MED ORDER — EPINEPHRINE PF 1 MG/ML IJ SOLN
INTRAMUSCULAR | Status: AC
  Filled 2020-05-04: qty 8

## 2020-05-04 MED ORDER — CYCLOPENTOLATE HCL 2 % OP SOLN
OPHTHALMIC | Status: AC
  Administered 2020-05-04: 1 [drp] via OPHTHALMIC
  Filled 2020-05-04: qty 2

## 2020-05-04 MED ORDER — PHENYLEPHRINE HCL 10 % OP SOLN
OPHTHALMIC | Status: AC
  Administered 2020-05-04: 1 [drp] via OPHTHALMIC
  Filled 2020-05-04: qty 5

## 2020-05-04 MED ORDER — DEXMEDETOMIDINE (PRECEDEX) IN NS 20 MCG/5ML (4 MCG/ML) IV SYRINGE
PREFILLED_SYRINGE | INTRAVENOUS | Status: AC
  Filled 2020-05-04: qty 5

## 2020-05-04 MED ORDER — POVIDONE-IODINE 5 % OP SOLN
OPHTHALMIC | Status: DC | PRN
  Administered 2020-05-04: 1 via OPHTHALMIC

## 2020-05-04 MED ORDER — FENTANYL CITRATE (PF) 100 MCG/2ML IJ SOLN
INTRAMUSCULAR | Status: DC | PRN
  Administered 2020-05-04: 50 ug via INTRAVENOUS
  Administered 2020-05-04: 25 ug via INTRAVENOUS

## 2020-05-04 SURGICAL SUPPLY — 20 items
CANNULA ANT/CHMB 27G (MISCELLANEOUS) IMPLANT
CANNULA ANT/CHMB 27GA (MISCELLANEOUS) ×2 IMPLANT
GLOVE BIO SURGEON STRL SZ8 (GLOVE) ×2 IMPLANT
GLOVE BIOGEL M 6.5 STRL (GLOVE) ×2 IMPLANT
GLOVE SURG LX 8.0 MICRO (GLOVE) ×1
GLOVE SURG LX STRL 8.0 MICRO (GLOVE) ×1 IMPLANT
GOWN STRL REUS W/ TWL LRG LVL3 (GOWN DISPOSABLE) ×2 IMPLANT
GOWN STRL REUS W/TWL LRG LVL3 (GOWN DISPOSABLE) ×4
LABEL CATARACT MEDS ST (LABEL) ×2 IMPLANT
LENS IOL TECNIS EYHANCE 24.0 (Intraocular Lens) ×1 IMPLANT
MANIFOLD NEPTUNE II (INSTRUMENTS) ×2 IMPLANT
PACK CATARACT (MISCELLANEOUS) ×2 IMPLANT
PACK CATARACT BRASINGTON LX (MISCELLANEOUS) ×2 IMPLANT
PACK EYE AFTER SURG (MISCELLANEOUS) ×2 IMPLANT
SOL BSS BAG (MISCELLANEOUS) ×2
SOLUTION BSS BAG (MISCELLANEOUS) ×1 IMPLANT
SYR 3ML LL SCALE MARK (SYRINGE) ×2 IMPLANT
SYR 5ML LL (SYRINGE) ×2 IMPLANT
WATER STERILE IRR 250ML POUR (IV SOLUTION) ×2 IMPLANT
WIPE NON LINTING 3.25X3.25 (MISCELLANEOUS) ×2 IMPLANT

## 2020-05-04 NOTE — H&P (Signed)
[facility]   Primary Care Physician:  Tower, [name], MD Ophthalmologist: Dr. George Ina  Pre-Procedure History & Physical: HPI:  Morgan Hubbard is a 85 y.o. female here for cataract surgery.   Past Medical History:  Diagnosis Date  . Arthritis    "hands" (01/13/2014)  . Basal cell carcinoma 01/2014   "bridge of nose"  . Cardiac LV ejection fraction >40%    "it was 43 last year" (01/13/2014)  . Chronic lower back pain   . CKD (chronic kidney disease), stage III    acute on chronic stage III/notes 06/10/2018  . Colon polyps   . Expressive aphasia    "3 times in the last week" (01/13/2014)  . GERD (gastroesophageal reflux disease)   . Goiter past remote   treated with RI  . Hyperlipidemia   . Hyperpotassemia   . Hypertension   . Hypertonicity of bladder   . Inflammatory and toxic neuropathy, unspecified   . LBBB (left bundle branch block)   . Migraine    "stopped many years ago" (01/13/2014)  . Mini stroke ([place]) 01/2014  . Other primary cardiomyopathies   . Overactive bladder   . Presence of combination internal cardiac defibrillator (ICD) and pacemaker   . Reflex sympathetic dystrophy, unspecified   . Shingles   . Shortness of breath    ambulation  . Stroke ([place])   . Thyroid disease   . Type II diabetes mellitus ([place])   . Ulcer   . Urine incontinence   . Vertigo    hx of    Past Surgical History:  Procedure Laterality Date  . BACK SURGERY    . BI-VENTRICULAR PACEMAKER INSERTION (CRT-P)  10/2014   [facility]  . BREAST CYST EXCISION Left 1959  . CARDIAC CATHETERIZATION  "several"   [place]  . CATARACT EXTRACTION W/ INTRAOCULAR LENS IMPLANT Left ~ 2005   several eye injections  . CHOLECYSTECTOMY N/A 06/13/2018   Procedure: LAPAROSCOPIC CHOLECYSTECTOMY;  Surgeon: Donnie Mesa, MD;  Location: [place];  Service: General;  Laterality: N/A;  . COLONOSCOPY  7/12   normal (hx of polyps in past)   . CT SCAN  3/12   outside hosp- lung nodule and gallstones  . ERCP N/A  06/12/2018   Procedure: ENDOSCOPIC RETROGRADE CHOLANGIOPANCREATOGRAPHY (ERCP);  Surgeon: Carol Ada, MD;  Location: [place];  Service: Endoscopy;  Laterality: N/A;  . [place]   "knot on index"  . KIDNEY DONATION Left 1989  . LUMBAR LAMINECTOMY/DECOMPRESSION MICRODISCECTOMY  1999  . LUMBAR LAMINECTOMY/DECOMPRESSION MICRODISCECTOMY  01/31/2012   Procedure: LUMBAR LAMINECTOMY/DECOMPRESSION MICRODISCECTOMY 2 LEVELS;  Surgeon: Eustace Moore, MD;  Location: [place] NEURO ORS;  Service: Neurosurgery;  Laterality: N/A;  Thoracic twelve-lumbar one, lumbar one-two laminectomy   . POSTERIOR LAMINECTOMY / DECOMPRESSION CERVICAL SPINE  1999  . REMOVAL OF STONES  06/12/2018   Procedure: REMOVAL OF STONES;  Surgeon: Carol Ada, MD;  Location: [facility] ENDOSCOPY;  Service: Endoscopy;;  . Joan Mayans  06/12/2018   Procedure: Joan Mayans;  Surgeon: Carol Ada, MD;  Location: [facility] ENDOSCOPY;  Service: Endoscopy;;  . [place]  . UPPER GASTROINTESTINAL ENDOSCOPY      Prior to Admission medications   Medication Sig Start Date End Date Taking? Authorizing Provider  aspirin 325 MG tablet Take 325 mg by mouth at bedtime.    Yes [provider]  atorvastatin (LIPITOR) 80 MG tablet Take 80 mg by mouth at bedtime.    Yes [provider]  Biotin w/ Vitamins C & E (HAIR/SKIN/NAILS PO) Take 1 tablet by mouth daily.    Yes [provider]  carvedilol (COREG) 12.5 MG tablet Take 1 tablet (12.5 mg total) by mouth 2 (two) times daily with a meal. 08/03/13  Yes Gollan, Kathlene November, MD  cetirizine (ZYRTEC) 10 MG tablet Take 10 mg by mouth 2 (two) times daily.   Yes [provider]  clotrimazole-betamethasone (LOTRISONE) cream Apply 1 application topically as needed (groin).  06/19/15  Yes [provider]  dapsone 25 MG tablet Take 25 mg by mouth daily.    Yes [provider]  ferrous sulfate 325 (65 FE) MG tablet Take 325 mg by mouth in  the morning and at bedtime.   Yes [provider]  gabapentin (NEURONTIN) 300 MG capsule Take 1 capsule (300 mg total) by mouth 3 (three) times daily. Patient taking differently: Take 300 mg by mouth in the morning and at bedtime.  06/15/19  Yes Tower, Marne A, MD  glimepiride (AMARYL) 2 MG tablet Take 2 mg by mouth daily.  11/04/18  Yes [provider]  hydrALAZINE (APRESOLINE) 50 MG tablet Take 50 mg by mouth 2 (two) times daily.  02/28/14  Yes [provider]  isosorbide mononitrate (IMDUR) 30 MG 24 hr tablet Take 1 tablet (30 mg total) by mouth daily. 08/03/13  Yes Minna Merritts, MD  letrozole [facility]) 2.5 MG tablet Take 2.5 mg by mouth daily.   Yes [provider]  losartan-hydrochlorothiazide (HYZAAR) 50-12.5 MG tablet Take 1 tablet by mouth daily. 06/15/19  Yes Tower, [name], MD  metFORMIN (GLUCOPHAGE) 1000 MG tablet Take 1,000 mg by mouth in the morning and at bedtime.  02/29/20  Yes [provider]  MILK THISTLE PO Take 1 capsule by mouth 2 (two) times daily.    Yes [provider]  mirabegron ER ([name]) 50 MG TB24 tablet Take 1 tablet (50 mg total) by mouth daily. Patient taking differently: Take 50 mg by mouth at bedtime.  10/01/19  Yes Tower, [name], MD  nystatin cream (MYCOSTATIN) Apply 1 application topically 2 (two) times daily as needed (irritation.).  12/15/19  Yes [provider]  pantoprazole (PROTONIX) 40 MG tablet Take 1 tablet (40 mg total) by mouth daily. 06/15/19  Yes Tower, [name], MD  traMADol (ULTRAM) 50 MG tablet TAKE 1 TO 2 TABLETS BY MOUTH EVERY 8 HOURS AS NEEDED FOR MODERATE TO SEVERE PAIN Patient taking differently: Take 50-100 mg by mouth every 8 (eight) hours as needed for moderate pain or severe pain.  02/04/20  Yes Tower, [name], MD  vitamin B-12 (CYANOCOBALAMIN) 1000 MCG tablet Take 1,000 mcg by mouth daily.    Yes [provider]  Vitamin D, Ergocalciferol, (DRISDOL) 1.25 MG (50000 UT) CAPS  capsule Take 50,000 Units by mouth every Monday.  10/06/18  Yes [provider]  glucose blood (ONE TOUCH ULTRA TEST) test strip Use 3 (three) times daily. 02/08/16   [provider]    Allergies as of 03/31/2020 - Review Complete 02/14/2020  Allergen Reaction Noted  . Ace inhibitors Swelling 07/09/2010  . Codeine Other (See Comments) 07/09/2010  . Hydrochlorothiazide Other (See Comments) 10/30/2011  . Insulin detemir Itching 02/09/2016  . Lisinopril Swelling 08/28/2010  . Nsaids Other (See Comments) 09/03/2012  . Tylenol [acetaminophen] Other (See Comments) 09/03/2012    Family History  Problem Relation Age of Onset  . Arthritis Mother   . Cancer Mother  uterine and mouth  . Hyperlipidemia Mother   . Stroke Mother   . Hypertension Mother   . Alcohol abuse Father   . Diabetes Father   . Cancer Sister        breast  . Diabetes Sister   . Cancer Brother        lung cancer  . Kidney disease Brother   . Diabetes Brother   . Hyperlipidemia Sister   . Heart disease Sister   . Hypertension Sister   . Diabetes Sister   . Hyperlipidemia Brother   . Kidney failure Brother   . Diabetes Daughter   . Addison's disease Other     Social History   Socioeconomic History  . Marital status: Married    Spouse name: Not on file  . Number of children: 2  . Years of education: Not on file  . Highest education level: Not on file  Occupational History  . Occupation: retired - Production manager   Tobacco Use  . Smoking status: Never Smoker  . Smokeless tobacco: Never Used  Substance and Sexual Activity  . Alcohol use: No    Alcohol/week: 0.0 standard drinks  . Drug use: No  . Sexual activity: Yes  Other Topics Concern  . Not on file  Social History Narrative   Married 2nd time   Daughter is Surveyor, minerals    Social Determinants of Health   Financial Resource Strain: Low Risk   . Difficulty of Paying Living Expenses: Not very hard  Food Insecurity: No Food  Insecurity  . Worried About Charity fundraiser in the Last Year: Never true  . Ran Out of Food in the Last Year: Never true  Transportation Needs: No Transportation Needs  . Lack of Transportation (Medical): No  . Lack of Transportation (Non-Medical): No  Physical Activity: Inactive  . Days of Exercise per Week: 0 days  . Minutes of Exercise per Session: 0 min  Stress: No Stress Concern Present  . Feeling of Stress : Not at all  Social Connections:   . Frequency of Communication with Friends and Family: Not on file  . Frequency of Social Gatherings with Friends and Family: Not on file  . Attends Religious Services: Not on file  . Active Member of Clubs or Organizations: Not on file  . Attends Archivist Meetings: Not on file  . Marital Status: Not on file  Intimate Partner Violence: Not At Risk  . Fear of Current or Ex-Partner: No  . Emotionally Abused: No  . Physically Abused: No  . Sexually Abused: No    Review of Systems: See HPI, otherwise negative ROS  Physical Exam: BP (!) 156/82   Pulse 66   Temp 97.8 F (36.6 C) (Oral)   Resp 14   Ht 5\' 1"  (1.549 m)   Wt 64.4 kg   LMP 06/03/1992   SpO2 96%   BMI 26.83 kg/m  General:   Alert,  pleasant and cooperative in NAD Head:  Normocephalic and atraumatic. Respiratory:  Normal work of breathing. Heart:  Regular rate and rhythm.   Impression/Plan: Morgan Hubbard is here for cataract surgery.  Risks, benefits, limitations, and alternatives regarding cataract surgery have been reviewed with the patient.  Questions have been answered.  All parties agreeable.   Birder Robson, MD  05/04/2020, 7:19 AM

## 2020-05-04 NOTE — Op Note (Signed)
PREOPERATIVE DIAGNOSIS:  Nuclear sclerotic cataract of the right eye.   POSTOPERATIVE DIAGNOSIS:  Nuclear Sclerotic Cataract Right Eye   OPERATIVE PROCEDURE:@   SURGEON:  Birder Robson, MD.   ANESTHESIA:  Anesthesiologist: Alvin Critchley, MD CRNA: Tollie Eth, CRNA  1.      Managed anesthesia care. 2.      0.68ml of Shugarcaine was instilled in the eye following the paracentesis.   COMPLICATIONS:  None.   TECHNIQUE:   Stop and chop   DESCRIPTION OF PROCEDURE:  The patient was examined and consented in the preoperative holding area where the aforementioned topical anesthesia was applied to the right eye and then brought back to the Operating Room where the right eye was prepped and draped in the usual sterile ophthalmic fashion and a lid speculum was placed. A paracentesis was created with the side port blade and the anterior chamber was filled with viscoelastic. A near clear corneal incision was performed with the steel keratome. A continuous curvilinear capsulorrhexis was performed with a cystotome followed by the capsulorrhexis forceps. Hydrodissection and hydrodelineation were carried out with BSS on a blunt cannula. The lens was removed in a stop and chop  technique and the remaining cortical material was removed with the irrigation-aspiration handpiece. The capsular bag was inflated with viscoelastic and the Technis [id number]  lens was placed in the capsular bag without complication. The remaining viscoelastic was removed from the eye with the irrigation-aspiration handpiece. The wounds were hydrated. The anterior chamber was flushed with BSS and the eye was inflated to physiologic pressure. 0.18ml of Vigamox was placed in the anterior chamber. Miostat was instilled.The wounds were found to be water tight. The eye was dressed with Vigamox. The patient was given protective glasses to wear throughout the day and a shield with which to sleep tonight. The patient was also given drops with  which to begin a drop regimen today and will follow-up with me in one day. Implant Name Type Inv. Item Serial No. Manufacturer Lot No. LRB No. Used Action  LENS IOL TECNIS EYHANCE 24.0 - [id number] Intraocular Lens LENS IOL TECNIS EYHANCE 24.0 [id number] JOHNSON   Right 1 Implanted   Procedure(s) with comments: CATARACT EXTRACTION PHACO AND INTRAOCULAR LENS PLACEMENT (IOC) RIGHT DIABETIC (Right) - [place] 00:58.5 CDE 6.70 Fluid Pack Lote # [id number]  Electronically signed: Birder Robson 05/04/2020 7:50 AM

## 2020-05-04 NOTE — Transfer of Care (Signed)
Immediate Anesthesia Transfer of Care Note  Patient: Morgan Hubbard  Procedure(s) Performed: CATARACT EXTRACTION PHACO AND INTRAOCULAR LENS PLACEMENT (IOC) RIGHT DIABETIC (Right Eye)  Patient Location: PACU  Anesthesia Type:MAC  Level of Consciousness: awake, alert  and oriented  Airway & Oxygen Therapy: Patient Spontanous Breathing  Post-op Assessment: Report given to RN and Post -op Vital signs reviewed and stable  Post vital signs: Reviewed and stable  Last Vitals:  Vitals Value Taken Time  BP 132/49 05/04/20 0753  Temp 36.5 C 05/04/20 0753  Pulse 59 05/04/20 0753  Resp 18 05/04/20 0753  SpO2 94 % 05/04/20 0753    Last Pain:  Vitals:   05/04/20 0753  TempSrc: Temporal  PainSc: 0-No pain         Complications: No complications documented.

## 2020-05-04 NOTE — Progress Notes (Signed)
   05/04/20 0730  Clinical Encounter Type  Visited With Family  Visit Type Initial  Referral From Chaplain  Consult/Referral To Chaplain  While rounding [facility] waiting area, chaplain briefly visited with Pt's husband to find out how he was doing while waiting. Pt's husband said he didn't have any questions or concerns.

## 2020-05-04 NOTE — Anesthesia Preprocedure Evaluation (Signed)
Anesthesia Evaluation  Patient identified by MRN, date of birth, ID band Patient awake    Reviewed: Allergy & Precautions, H&P , NPO status , Patient's Chart, lab work & pertinent test results, reviewed documented beta blocker date and time   History of Anesthesia Complications Negative for: history of anesthetic complications  Airway Mallampati: I   Neck ROM: Full  Mouth opening: Limited Mouth Opening  Dental  (+) Teeth Intact, Dental Advisory Given   Pulmonary shortness of breath,    breath sounds clear to auscultation       Cardiovascular hypertension, Pt. on medications and Pt. on home beta blockers (-) angina+ Peripheral Vascular Disease and +CHF  + dysrhythmias + Cardiac Defibrillator  Rhythm:Regular Rate:Normal     Neuro/Psych  Headaches,  Neuromuscular disease CVA    GI/Hepatic GERD  Medicated and Controlled,Fatty liver. Cholecystitis   Endo/Other  diabetes, Well Controlled, Type 2, Oral Hypoglycemic Agents  Renal/GU Renal InsufficiencyRenal diseasePt has only one kidney as she donated one.     Musculoskeletal   Abdominal Normal abdominal exam  (+)   Peds  Hematology  (+) Blood dyscrasia, anemia ,   Anesthesia Other Findings - Left ventricle: The cavity size was normal. Systolic function was   mildly to moderately reduced. The estimated ejection fraction was   in the range of 40% to 45%. Diffuse hypokinesis. Features are   consistent with a pseudonormal left ventricular filling pattern,   with concomitant abnormal relaxation and increased filling   pressure (grade 2 diastolic dysfunction). - Ventricular septum: Septal motion showed abnormal function,   dyssynergy, and paradox. These changes are consistent with   intraventricular conduction delay. - Mitral valve: There was mild to moderate regurgitation directed   centrally. - Right ventricle: Systolic function was mildly reduced. - Pulmonary arteries:  Systolic pressure was moderately increased.   PA peak pressure: 54 mm Hg (S). - Pericardium, extracardiac: A trivial pericardial effusion was   identified posterior to the heart. There was a left pleural   effusion.  Reproductive/Obstetrics                             Anesthesia Physical  Anesthesia Plan  ASA: III  Anesthesia Plan: General and MAC   Post-op Pain Management:    Induction: Intravenous  PONV Risk Score and Plan:   Airway Management Planned: Nasal Cannula  Additional Equipment: None  Intra-op Plan:   Post-operative Plan:   Informed Consent: I have reviewed the patients History and Physical, chart, labs and discussed the procedure including the risks, benefits and alternatives for the proposed anesthesia with the patient or authorized representative who has indicated his/her understanding and acceptance.     Dental advisory given  Plan Discussed with: CRNA and Surgeon  Anesthesia Plan Comments:         Anesthesia Quick Evaluation

## 2020-05-05 DIAGNOSIS — I10 Essential (primary) hypertension: Secondary | ICD-10-CM | POA: Diagnosis not present

## 2020-05-05 DIAGNOSIS — Z95810 Presence of automatic (implantable) cardiac defibrillator: Secondary | ICD-10-CM | POA: Diagnosis not present

## 2020-05-05 DIAGNOSIS — I428 Other cardiomyopathies: Secondary | ICD-10-CM | POA: Diagnosis not present

## 2020-05-06 ENCOUNTER — Encounter: Payer: Self-pay | Admitting: Ophthalmology

## 2020-05-06 NOTE — Anesthesia Postprocedure Evaluation (Signed)
Anesthesia Post Note  Patient: Morgan Hubbard  Procedure(s) Performed: CATARACT EXTRACTION PHACO AND INTRAOCULAR LENS PLACEMENT (IOC) RIGHT DIABETIC (Right Eye)  Patient location during evaluation: PACU Anesthesia Type: MAC Level of consciousness: awake and alert and oriented Pain management: pain level controlled Vital Signs Assessment: post-procedure vital signs reviewed and stable Respiratory status: spontaneous breathing Cardiovascular status: blood pressure returned to baseline Anesthetic complications: no   No complications documented.   Last Vitals:  Vitals:   05/04/20 0637 05/04/20 0753  BP: (!) 156/82 (!) 132/49  Pulse: 66 (!) 59  Resp: 14 18  Temp: 36.6 C 36.5 C  SpO2: 96% 94%    Last Pain:  Vitals:   05/05/20 1410  TempSrc:   PainSc: 0-No pain                 Tyshun Tuckerman

## 2020-05-08 LAB — GLUCOSE, CAPILLARY: Glucose-Capillary: 192 mg/dL — ABNORMAL HIGH (ref 70–99)

## 2020-05-09 ENCOUNTER — Encounter: Payer: Self-pay | Admitting: Family Medicine

## 2020-06-05 ENCOUNTER — Other Ambulatory Visit: Payer: Self-pay | Admitting: Family Medicine

## 2020-06-05 DIAGNOSIS — Z95810 Presence of automatic (implantable) cardiac defibrillator: Secondary | ICD-10-CM | POA: Diagnosis not present

## 2020-06-06 NOTE — Telephone Encounter (Signed)
Name of [place] Name of [place] or Written Date and Quantity:02/04/20#60 tabs with 0 refills Last Office Visit and Type:f/u from surgery on 02/14/20 Next Office Visit and Type:CPE/AWV 06/16/20  protonix also due for refill

## 2020-06-11 ENCOUNTER — Telehealth: Payer: Self-pay | Admitting: Family Medicine

## 2020-06-11 DIAGNOSIS — I10 Essential (primary) hypertension: Secondary | ICD-10-CM

## 2020-06-11 DIAGNOSIS — E1129 Type 2 diabetes mellitus with other diabetic kidney complication: Secondary | ICD-10-CM

## 2020-06-11 DIAGNOSIS — E1169 Type 2 diabetes mellitus with other specified complication: Secondary | ICD-10-CM

## 2020-06-11 NOTE — Telephone Encounter (Signed)
-----   Message from Cloyd Stagers, RT sent at 05/29/2020  2:05 PM EST ----- Regarding: Lab Orders for Monday 1.10.2022 Please place lab orders for Monday 1.10.2022, office visit for physical on Friday 1.14.2022 Thank you, Dyke Maes RT(R)

## 2020-06-12 ENCOUNTER — Other Ambulatory Visit (INDEPENDENT_AMBULATORY_CARE_PROVIDER_SITE_OTHER): Payer: Medicare HMO

## 2020-06-12 ENCOUNTER — Ambulatory Visit: Payer: Medicare HMO

## 2020-06-12 ENCOUNTER — Other Ambulatory Visit: Payer: Self-pay

## 2020-06-12 DIAGNOSIS — E1169 Type 2 diabetes mellitus with other specified complication: Secondary | ICD-10-CM | POA: Diagnosis not present

## 2020-06-12 DIAGNOSIS — I10 Essential (primary) hypertension: Secondary | ICD-10-CM

## 2020-06-12 DIAGNOSIS — E785 Hyperlipidemia, unspecified: Secondary | ICD-10-CM

## 2020-06-12 LAB — COMPREHENSIVE METABOLIC PANEL
ALT: 14 U/L (ref 0–35)
AST: 21 U/L (ref 0–37)
Albumin: 3.8 g/dL (ref 3.5–5.2)
Alkaline Phosphatase: 68 U/L (ref 39–117)
BUN: 22 mg/dL (ref 6–23)
CO2: 29 mEq/L (ref 19–32)
Calcium: 9.3 mg/dL (ref 8.4–10.5)
Chloride: 107 mEq/L (ref 96–112)
Creatinine, Ser: 1.35 mg/dL — ABNORMAL HIGH (ref 0.40–1.20)
GFR: 36.34 mL/min — ABNORMAL LOW (ref >60.00)
Glucose, Bld: 160 mg/dL — ABNORMAL HIGH (ref 70–99)
Potassium: 5.3 mEq/L — ABNORMAL HIGH (ref 3.5–5.1)
Sodium: 141 mEq/L (ref 135–145)
Total Bilirubin: 0.6 mg/dL (ref 0.2–1.2)
Total Protein: 5.7 g/dL — ABNORMAL LOW (ref 6.0–8.3)

## 2020-06-12 LAB — CBC WITH DIFFERENTIAL/PLATELET
Basophils Absolute: 0 10*3/uL (ref 0.0–0.1)
Basophils Relative: 0.4 % (ref 0.0–3.0)
Eosinophils Absolute: 0.4 10*3/uL (ref 0.0–0.7)
Eosinophils Relative: 5.9 % — ABNORMAL HIGH (ref 0.0–5.0)
HCT: 35.2 % — ABNORMAL LOW (ref 36.0–46.0)
Hemoglobin: 11.5 g/dL — ABNORMAL LOW (ref 12.0–15.0)
Lymphocytes Relative: 45.2 % (ref 12.0–46.0)
Lymphs Abs: 3.4 10*3/uL (ref 0.7–4.0)
MCHC: 32.7 g/dL (ref 30.0–36.0)
MCV: 95.9 fl (ref 78.0–100.0)
Monocytes Absolute: 0.5 10*3/uL (ref 0.1–1.0)
Monocytes Relative: 7.2 % (ref 3.0–12.0)
Neutro Abs: 3.1 10*3/uL (ref 1.4–7.7)
Neutrophils Relative %: 41.3 % — ABNORMAL LOW (ref 43.0–77.0)
Platelets: 229 10*3/uL (ref 150.0–400.0)
RBC: 3.67 Mil/uL — ABNORMAL LOW (ref 3.87–5.11)
RDW: 13.1 % (ref 11.5–15.5)
WBC: 7.5 10*3/uL (ref 4.0–10.5)

## 2020-06-12 LAB — TSH: TSH: 5.08 u[IU]/mL — ABNORMAL HIGH (ref 0.35–4.50)

## 2020-06-12 LAB — LIPID PANEL
Cholesterol: 99 mg/dL (ref 0–200)
HDL: 33.2 mg/dL — ABNORMAL LOW (ref >39.00)
NonHDL: 66.09
Total CHOL/HDL Ratio: 3
Triglycerides: 209 mg/dL — ABNORMAL HIGH (ref 0.0–149.0)
VLDL: 41.8 mg/dL — ABNORMAL HIGH (ref 0.0–40.0)

## 2020-06-12 LAB — LDL CHOLESTEROL, DIRECT: Direct LDL: 46 mg/dL

## 2020-06-14 DIAGNOSIS — Z08 Encounter for follow-up examination after completed treatment for malignant neoplasm: Secondary | ICD-10-CM | POA: Diagnosis not present

## 2020-06-14 DIAGNOSIS — L821 Other seborrheic keratosis: Secondary | ICD-10-CM | POA: Diagnosis not present

## 2020-06-14 DIAGNOSIS — Z85828 Personal history of other malignant neoplasm of skin: Secondary | ICD-10-CM | POA: Diagnosis not present

## 2020-06-14 DIAGNOSIS — L508 Other urticaria: Secondary | ICD-10-CM | POA: Diagnosis not present

## 2020-06-16 ENCOUNTER — Encounter: Payer: Self-pay | Admitting: Family Medicine

## 2020-06-16 ENCOUNTER — Encounter: Payer: Medicare HMO | Admitting: Family Medicine

## 2020-06-16 ENCOUNTER — Other Ambulatory Visit: Payer: Self-pay

## 2020-06-16 ENCOUNTER — Ambulatory Visit (INDEPENDENT_AMBULATORY_CARE_PROVIDER_SITE_OTHER): Payer: Medicare HMO | Admitting: Family Medicine

## 2020-06-16 VITALS — BP 118/68 | HR 66 | Temp 96.9°F | Ht 60.5 in | Wt 146.4 lb

## 2020-06-16 DIAGNOSIS — E1169 Type 2 diabetes mellitus with other specified complication: Secondary | ICD-10-CM

## 2020-06-16 DIAGNOSIS — K760 Fatty (change of) liver, not elsewhere classified: Secondary | ICD-10-CM | POA: Diagnosis not present

## 2020-06-16 DIAGNOSIS — M85839 Other specified disorders of bone density and structure, unspecified forearm: Secondary | ICD-10-CM

## 2020-06-16 DIAGNOSIS — D649 Anemia, unspecified: Secondary | ICD-10-CM | POA: Diagnosis not present

## 2020-06-16 DIAGNOSIS — D509 Iron deficiency anemia, unspecified: Secondary | ICD-10-CM | POA: Insufficient documentation

## 2020-06-16 DIAGNOSIS — I10 Essential (primary) hypertension: Secondary | ICD-10-CM | POA: Diagnosis not present

## 2020-06-16 DIAGNOSIS — Z853 Personal history of malignant neoplasm of breast: Secondary | ICD-10-CM

## 2020-06-16 DIAGNOSIS — I428 Other cardiomyopathies: Secondary | ICD-10-CM | POA: Diagnosis not present

## 2020-06-16 DIAGNOSIS — L659 Nonscarring hair loss, unspecified: Secondary | ICD-10-CM | POA: Diagnosis not present

## 2020-06-16 DIAGNOSIS — E1129 Type 2 diabetes mellitus with other diabetic kidney complication: Secondary | ICD-10-CM

## 2020-06-16 DIAGNOSIS — Z0000 Encounter for general adult medical examination without abnormal findings: Secondary | ICD-10-CM

## 2020-06-16 DIAGNOSIS — E875 Hyperkalemia: Secondary | ICD-10-CM | POA: Diagnosis not present

## 2020-06-16 DIAGNOSIS — R7989 Other specified abnormal findings of blood chemistry: Secondary | ICD-10-CM | POA: Diagnosis not present

## 2020-06-16 DIAGNOSIS — N289 Disorder of kidney and ureter, unspecified: Secondary | ICD-10-CM | POA: Diagnosis not present

## 2020-06-16 DIAGNOSIS — E785 Hyperlipidemia, unspecified: Secondary | ICD-10-CM

## 2020-06-16 LAB — FERRITIN: Ferritin: 90.1 ng/mL (ref 10.0–291.0)

## 2020-06-16 LAB — TSH: TSH: 2.62 u[IU]/mL (ref 0.35–4.50)

## 2020-06-16 LAB — IRON: Iron: 70 ug/dL (ref 42–145)

## 2020-06-16 LAB — T4, FREE: Free T4: 1.46 ng/dL (ref 0.60–1.60)

## 2020-06-16 MED ORDER — MIRABEGRON ER 50 MG PO TB24
50.0000 mg | ORAL_TABLET | Freq: Every day | ORAL | 3 refills | Status: DC
Start: 2020-06-16 — End: 2022-02-08

## 2020-06-16 MED ORDER — GABAPENTIN 300 MG PO CAPS
300.0000 mg | ORAL_CAPSULE | Freq: Three times a day (TID) | ORAL | 3 refills | Status: DC
Start: 2020-06-16 — End: 2022-04-03

## 2020-06-16 MED ORDER — LOSARTAN POTASSIUM-HCTZ 50-12.5 MG PO TABS
1.0000 | ORAL_TABLET | Freq: Every day | ORAL | 3 refills | Status: DC
Start: 2020-06-16 — End: 2020-09-27

## 2020-06-16 MED ORDER — PANTOPRAZOLE SODIUM 40 MG PO TBEC
40.0000 mg | DELAYED_RELEASE_TABLET | Freq: Every day | ORAL | 3 refills | Status: DC
Start: 2020-06-16 — End: 2021-09-19

## 2020-06-16 NOTE — Progress Notes (Signed)
Subjective:    Patient ID: Morgan Hubbard, female    DOB: 04-14-1935, 86 y.o.   MRN: [id number]  This visit occurred during the SARS-CoV-2 public health emergency.  Safety protocols were in place, including screening questions prior to the visit, additional usage of staff PPE, and extensive cleaning of exam room while observing appropriate contact time as indicated for disinfecting solutions.    HPI Here for health maintenance exam and to review chronic medical problems    Wt Readings from Last 3 Encounters:  06/16/20 146 lb 6 oz (66.4 kg)  05/04/20 141 lb 15.6 oz (64.4 kg)  02/14/20 148 lb 6 oz (67.3 kg)   28.12 kg/m  Staying in  Had cataract surg and will get glasses    amw is scheduled for 06/20/20  Mammogram 6/20- has another one scheduled ([facility]) for may or June  Sent for last one  Self breast exam no lumps  dexa 2/20 -mild in forearm Takes letrozole which inc risk of OP Falls-none Fractures -none  Takes vit D- weekly   No exercise  Not on feet a lot  Could do chair exercise    Colonoscopy 7/12  Tried to do cologuard and could not  She takes iron and stool is black    covid immunized   Eye exam utd  HTN bp is stable today  No cp or palpitations or headaches or edema  No side effects to medicines  BP Readings from Last 3 Encounters:  06/16/20 118/68  05/04/20 (!) 132/49  02/14/20 128/70     Pulse Readings from Last 3 Encounters:  06/16/20 66  05/04/20 (!) 59  02/14/20 79   Taking carvedilol 12.5 mg bid Hydralazine 50 mg bid  imdur 30 mg daily  Losartan hct 50-12.5 mg daily   Had pacemaker battery replaced in august  It was painful    Lab Results  Component Value Date   CREATININE 1.35 (H) 06/12/2020   BUN 22 06/12/2020   NA 141 06/12/2020   K 5.3 No hemolysis seen (H) 06/12/2020   CL 107 06/12/2020   CO2 29 06/12/2020   Cr is up from 1.18  GFR now 36.3 Does not drink enough fluids (she does not have a water restriction from cardiolo)   Drinks a pepsi per day  Also a little coffee  She has appt with nephrologist in Feb  Diabetes has aff kidney   K is nl for her   Fatty liver Lab Results  Component Value Date   ALT 14 06/12/2020   AST 21 06/12/2020   ALKPHOS 68 06/12/2020   BILITOT 0.6 06/12/2020    DM2 Sees endocrinology  Up and down  Was 6 range for a1c  Ate worse during the holidays   Hyperlipidemia Lab Results  Component Value Date   CHOL 99 06/12/2020   CHOL 110 06/10/2019   CHOL 118 06/30/2018   Lab Results  Component Value Date   HDL 33.20 (L) 06/12/2020   HDL 34.70 (L) 06/10/2019   HDL 31.20 (L) 06/30/2018   Lab Results  Component Value Date   LDLCALC 46 06/10/2019   LDLCALC 61 06/30/2018   LDLCALC 54 06/09/2018   Lab Results  Component Value Date   TRIG 209.0 (H) 06/12/2020   TRIG 146.0 06/10/2019   TRIG 126.0 06/30/2018   Lab Results  Component Value Date   CHOLHDL 3 06/12/2020   CHOLHDL 3 06/10/2019   CHOLHDL 4 06/30/2018   Lab Results  Component Value Date  LDLDIRECT 46.0 06/12/2020   LDLDIRECT 77.0 04/22/2016   LDLDIRECT 151.3 03/02/2013   Atorvastatin 80 mg daily  Trig up /sugar may be up   Mild anemia  Lab Results  Component Value Date   WBC 7.5 06/12/2020   HGB 11.5 (L) 06/12/2020   HCT 35.2 (L) 06/12/2020   MCV 95.9 06/12/2020   PLT 229.0 06/12/2020  Dr Kellie Moor px her iron (hair loss)       TSH Lab Results  Component Value Date   TSH 5.08 (H) 06/12/2020   this is new   Patient Active Problem List   Diagnosis Date Noted  . Anemia 06/16/2020  . Elevated TSH 06/16/2020  . Occipital neuralgia 02/14/2020  . Hair loss 11/09/2018  . Leg weakness, bilateral 06/29/2018  . Pedal edema 06/22/2018  . AICD (automatic cardioverter/defibrillator) present 06/11/2018  . GERD (gastroesophageal reflux disease) 06/10/2018  . Cholecystitis 06/10/2018  . Tingling 10/06/2017  . Chronic back pain 05/23/2017  . Chronic systolic (congestive) heart failure ([place])  02/25/2017  . Osteopenia 06/16/2016  . Routine general medical examination at a health care facility 04/24/2016  . Estrogen deficiency 04/24/2016  . Urticaria 11/03/2015  . Cerumen impaction 03/10/2015  . Encounter for Medicare annual wellness exam 06/13/2014  . Stroke, small vessel ([place]) 01/18/2014  . Family history of hemochromatosis 01/18/2014  . Cerebral thrombosis with cerebral infarction ([place]) 01/14/2014  . Expressive aphasia 01/13/2014  . Nonischemic cardiomyopathy ([place]) 04/05/2013  . Shortness of breath 09/03/2012  . Elevated transaminase level 05/18/2012  . Spinal stenosis of lumbar region 12/10/2011  . Mixed incontinence urge and stress 12/10/2011  . Renal insufficiency 11/04/2011  . Goiter 01/14/2011  . Fatty liver 08/28/2010  . Type II diabetes mellitus with renal manifestations ([place]) 07/09/2010  . Hyperlipidemia associated with type 2 diabetes mellitus ([place]) 07/09/2010  . Hyperkalemia 07/09/2010  . REFLEX SYMPATHETIC DYSTROPHY 07/09/2010  . Neuropathy 07/09/2010  . Essential hypertension 07/09/2010  . CARDIOMYOPATHY 07/09/2010  . OVERACTIVE BLADDER 07/09/2010   Past Medical History:  Diagnosis Date  . Arthritis    "hands" (01/13/2014)  . Basal cell carcinoma 01/2014   "bridge of nose"  . Cardiac LV ejection fraction >40%    "it was 43 last year" (01/13/2014)  . Chronic lower back pain   . CKD (chronic kidney disease), stage III (HCC)    acute on chronic stage III/notes 06/10/2018  . Colon polyps   . Expressive aphasia    "3 times in the last week" (01/13/2014)  . GERD (gastroesophageal reflux disease)   . Goiter past remote   treated with RI  . Hyperlipidemia   . Hyperpotassemia   . Hypertension   . Hypertonicity of bladder   . Inflammatory and toxic neuropathy, unspecified   . LBBB (left bundle branch block)   . Migraine    "stopped many years ago" (01/13/2014)  . Mini stroke ([place]) 01/2014  . Other primary cardiomyopathies   . Overactive bladder   .  Presence of combination internal cardiac defibrillator (ICD) and pacemaker   . Reflex sympathetic dystrophy, unspecified   . Shingles   . Shortness of breath    ambulation  . Stroke ([place])   . Thyroid disease   . Type II diabetes mellitus ([place])   . Ulcer   . Urine incontinence   . Vertigo    hx of   Past Surgical History:  Procedure Laterality Date  . BACK SURGERY    . BI-VENTRICULAR PACEMAKER INSERTION (CRT-P)  10/2014   [facility]  .  BREAST CYST EXCISION Left 1959  . CARDIAC CATHETERIZATION  "several"   [place]  . CATARACT EXTRACTION W/ INTRAOCULAR LENS IMPLANT Left ~ 2005   several eye injections  . CATARACT EXTRACTION W/PHACO Right 05/04/2020   Procedure: CATARACT EXTRACTION PHACO AND INTRAOCULAR LENS PLACEMENT ([place]) RIGHT DIABETIC;  Surgeon: Birder Robson, MD;  Location: [facility] ORS;  Service: Ophthalmology;  Laterality: Right;  [place] 00:58.5 CDE 6.70 Fluid Pack [place] # [id number]  . CHOLECYSTECTOMY N/A 06/13/2018   Procedure: LAPAROSCOPIC CHOLECYSTECTOMY;  Surgeon: Donnie Mesa, MD;  Location: [facility];  Service: General;  Laterality: N/A;  . COLONOSCOPY  7/12   normal (hx of polyps in past)   . CT SCAN  3/12   outside hosp- lung nodule and gallstones  . ERCP N/A 06/12/2018   Procedure: ENDOSCOPIC RETROGRADE CHOLANGIOPANCREATOGRAPHY (ERCP);  Surgeon: Carol Ada, MD;  Location: [facility];  Service: Endoscopy;  Laterality: N/A;  . [place]   "knot on index"  . KIDNEY DONATION Left 1989  . LUMBAR LAMINECTOMY/DECOMPRESSION MICRODISCECTOMY  1999  . LUMBAR LAMINECTOMY/DECOMPRESSION MICRODISCECTOMY  01/31/2012   Procedure: LUMBAR LAMINECTOMY/DECOMPRESSION MICRODISCECTOMY 2 LEVELS;  Surgeon: Eustace Moore, MD;  Location: [place] [facility]URO OR[facility];  Service: Neurosurgery;  Laterality: N/A;  Thoracic twelve-lumbar one, lumbar one-two laminectomy   . POSTERIOR LAMINECTOMY / DECOMPRESSION CERVICAL SPINE  1999  . REMOVAL OF STONES  06/12/2018   Procedure: REMOVAL OF STONES;   Surgeon: Carol Ada, MD;  Location: [facility] ENDOSCOPY;  Service: Endoscopy;;  . Joan Mayans  06/12/2018   Procedure: Joan Mayans;  Surgeon: Carol Ada, MD;  Location: [facility] ENDOSCOPY;  Service: Endoscopy;;  . [place]  . UPPER GASTROINTESTINAL ENDOSCOPY     Social History   Tobacco Use  . Smoking status: Never Smoker  . Smokeless tobacco: Never Used  Substance Use Topics  . Alcohol use: No    Alcohol/week: 0.0 standard drinks  . Drug use: No   Family History  Problem Relation Age of Onset  . Arthritis Mother   . Cancer Mother        uterine and mouth  . Hyperlipidemia Mother   . Stroke Mother   . Hypertension Mother   . Alcohol abuse Father   . Diabetes Father   . Cancer Sister        breast  . Diabetes Sister   . Cancer Brother        lung cancer  . Kidney disease Brother   . Diabetes Brother   . Hyperlipidemia Sister   . Heart disease Sister   . Hypertension Sister   . Diabetes Sister   . Hyperlipidemia Brother   . Kidney failure Brother   . Diabetes Daughter   . Addison's disease Other    Allergies  Allergen Reactions  . Ace Inhibitors Swelling    REACTION: tongue swelling  . Codeine Nausea Only  . Hydrochlorothiazide Other (See Comments)    Dizziness/funny feeling  . Insulin Detemir Itching  . Lisinopril Swelling    Swelling of tongue  . Nsaids Other (See Comments)    Renal issues  . Tylenol [Acetaminophen] Other (See Comments)    Fatty liver   Current Outpatient Medications on File Prior to Visit  Medication Sig Dispense Refill  . aspirin 325 MG tablet Take 325 mg by mouth at bedtime.     Marland Kitchen atorvastatin (LIPITOR) 80 MG tablet Take 80 mg by mouth at bedtime.     . Biotin w/ Vitamins C & E (HAIR/SKIN/NAILS PO)  Take 1 tablet by mouth daily.     . carvedilol (COREG) 12.5 MG tablet Take 1 tablet (12.5 mg total) by mouth 2 (two) times daily with a meal. 180 tablet 4  . cetirizine (ZYRTEC) 10 MG tablet Take 10 mg by mouth 2 (two) times  daily.    . clotrimazole-betamethasone (LOTRISONE) cream Apply 1 application topically as needed (groin).     . dapsone 25 MG tablet Take 25 mg by mouth daily.     . ferrous sulfate 325 (65 FE) MG tablet Take 325 mg by mouth in the morning and at bedtime.    Marland Kitchen glimepiride (AMARYL) 2 MG tablet Take 2 mg by mouth daily.     Marland Kitchen glucose blood test strip Use 3 (three) times daily.    . hydrALAZINE (APRESOLINE) 50 MG tablet Take 50 mg by mouth 2 (two) times daily.     . isosorbide mononitrate (IMDUR) 30 MG 24 hr tablet Take 1 tablet (30 mg total) by mouth daily. 90 tablet 4  . letrozole (FEMARA) 2.5 MG tablet Take 2.5 mg by mouth daily.    . metFORMIN (GLUCOPHAGE) 1000 MG tablet Take 1,000 mg by mouth in the morning and at bedtime.     Marland Kitchen MILK THISTLE PO Take 1 capsule by mouth 2 (two) times daily.     Marland Kitchen nystatin cream (MYCOSTATIN) Apply 1 application topically 2 (two) times daily as needed (irritation.).     Marland Kitchen traMADol (ULTRAM) 50 MG tablet TAKE 1 TO 2 TABLETS BY MOUTH EVERY 8 HOURS AS NEEDED FOR MODERATE TO SEVERE PAIN 60 tablet 0  . vitamin B-12 (CYANOCOBALAMIN) 1000 MCG tablet Take 1,000 mcg by mouth daily.     . Vitamin D, Ergocalciferol, (DRISDOL) 1.25 MG (50000 UT) CAPS capsule Take 50,000 Units by mouth every Monday.      No current facility-administered medications on file prior to visit.     Review of Systems  Constitutional: Positive for fatigue. Negative for activity change, appetite change, fever and unexpected weight change.  HENT: Negative for congestion, ear pain, rhinorrhea, sinus pressure and sore throat.   Eyes: Negative for pain, redness and visual disturbance.  Respiratory: Negative for cough, shortness of breath and wheezing.   Cardiovascular: Negative for chest pain and palpitations.  Gastrointestinal: Negative for abdominal pain, blood in stool, constipation and diarrhea.  Endocrine: Negative for polydipsia and polyuria.  Genitourinary: Negative for dysuria, frequency and  urgency.  Musculoskeletal: Positive for arthralgias. Negative for back pain and myalgias.  Skin: Negative for pallor and rash.  Allergic/Immunologic: Negative for environmental allergies.  Neurological: Negative for dizziness, syncope and headaches.  Hematological: Negative for adenopathy. Does not bruise/bleed easily.  Psychiatric/Behavioral: Negative for decreased concentration and dysphoric mood. The patient is not nervous/anxious.        Objective:   Physical Exam Constitutional:      General: She is not in acute distress.    Appearance: Normal appearance. She is well-developed and normal weight. She is not ill-appearing or diaphoretic.  HENT:     Head: Normocephalic and atraumatic.     Right Ear: Tympanic membrane, ear canal and external ear normal.     Left Ear: Tympanic membrane, ear canal and external ear normal.     Nose: Nose normal. No congestion.     Mouth/Throat:     Mouth: Mucous membranes are moist.     Pharynx: Oropharynx is clear. No posterior oropharyngeal erythema.  Eyes:     General: No scleral icterus.  Extraocular Movements: Extraocular movements intact.     Conjunctiva/sclera: Conjunctivae normal.     Pupils: Pupils are equal, round, and reactive to light.  Neck:     Thyroid: No thyromegaly.     Vascular: No carotid bruit or JVD.  Cardiovascular:     Rate and Rhythm: Normal rate and regular rhythm.     Pulses: Normal pulses.     Heart sounds: Normal heart sounds. No gallop.   Pulmonary:     Effort: Pulmonary effort is normal. No respiratory distress.     Breath sounds: Normal breath sounds. No wheezing.     Comments: Good air exch Chest:     Chest wall: No tenderness.  Abdominal:     General: Bowel sounds are normal. There is no distension or abdominal bruit.     Palpations: Abdomen is soft. There is no mass.     Tenderness: There is no abdominal tenderness.     Hernia: No hernia is present.  Genitourinary:    Comments: Breast exam: No mass,  nodules, thickening, tenderness, bulging, retraction, inflamation, nipple discharge or skin changes noted.  No axillary or clavicular LA.    (baseline surgical changes on the right) Musculoskeletal:        General: No tenderness. Normal range of motion.     Cervical back: Normal range of motion and neck supple. No rigidity. No muscular tenderness.     Right lower leg: No edema.     Left lower leg: No edema.     Comments: Mild kyphosis   Lymphadenopathy:     Cervical: No cervical adenopathy.  Skin:    General: Skin is warm and dry.     Coloration: Skin is not pale.     Findings: No erythema or rash.     Comments: Solar lentigines diffusely Some sks  Neurological:     Mental Status: She is alert. Mental status is at baseline.     Cranial Nerves: No cranial nerve deficit.     Motor: No abnormal muscle tone.     Coordination: Coordination normal.     Gait: Gait normal.     Deep Tendon Reflexes: Reflexes are normal and symmetric. Reflexes normal.  Psychiatric:        Mood and Affect: Mood normal.        Cognition and Memory: Cognition and memory normal.           Assessment & Plan:   Problem List Items Addressed This Visit      Cardiovascular and Mediastinum   Essential hypertension    bp in fair control at this time  BP Readings from Last 1 Encounters:  06/16/20 118/68   No changes needed Most recent labs reviewed  Disc lifstyle change with low sodium diet and exercise  Plan to continue Carvedilol 12.5 mg bid Hydralazine 50 mg bid imdur 30 mg daily  Losartan hct 50-12.5 mg daily        Relevant Medications   losartan-hydrochlorothiazide (HYZAAR) 50-12.5 MG tablet   Nonischemic cardiomyopathy (HCC)    Under care of cardiology, no clinical changes       Relevant Medications   losartan-hydrochlorothiazide (HYZAAR) 50-12.5 MG tablet     Digestive   Fatty liver     Endocrine   Type II diabetes mellitus with renal manifestations ([place])    Under care of  endocrinology  Per pt last a1c was in the 6-7 range  Diet is fair        Relevant Medications  losartan-hydrochlorothiazide (HYZAAR) 50-12.5 MG tablet   Hyperlipidemia associated with type 2 diabetes mellitus (HCC)    Disc goals for lipids and reasons to control them Rev last labs with pt Rev low sat fat diet in detail Taking atorvastatin 80 mg daily LDL is 46 Trig up slightly poss due to glucose      Relevant Medications   losartan-hydrochlorothiazide (HYZAAR) 50-12.5 MG tablet     Musculoskeletal and Integument   Osteopenia    dexa 2/20 -due for 2 y rec in feb No falls or fx  Taking vit D  Discussed trying chair exercise  Taking letrozole -aware          Genitourinary   Renal insufficiency    Pt sees nephrology-has f/u in fev Diabetic nephropathy  Also anemia  Cr 1.35  K is slt elevated - baseline for her /does take arb Needs to drink more fluids         Other   Hyperkalemia    With renal insuff  Sees nephrology  Taking arb/diabetic      Routine general medical examination at a health care facility - Primary    Reviewed health habits including diet and exercise and skin cancer prevention Reviewed appropriate screening tests for age  Also reviewed health mt list, fam hx and immunization status , as well as social and family history   See HPI Labs reviewed  Mammogram is due in June (h/o breast cancer on letrozole) , sent for last report from [facility] due in feb, no falls or fractures  Disc chair exercise program  covid immunized       Hair loss    Pt sees dermatology and had iron def  Taking iron  Last cbc -hb was 11.5 (new)  Iron and ferritin levels done        Anemia    Taking iron  Iron level and ferritin today  Hb 11.5  Normocytic Has renal insufficiency      Relevant Orders   Iron (Completed)   Ferritin (Completed)   Elevated TSH    TSH and FT4 done  Pt has some fatigue  Baseline hair loss      Relevant Orders   TSH  (Completed)   T4, free (Completed)   History of breast cancer    Taking letrozole  Mammogram due in june

## 2020-06-16 NOTE — Patient Instructions (Addendum)
Stop at checkout to sign release to get mammogram report  Please call us in the spring to set up bone density test   Chair exercise may be a good idea /there are a lot of videos   Aim for 64 oz of fluid daily - add water to water to what you are drinking     Take good care of yourself   Labs for thyroid and iron/iron stores

## 2020-06-18 DIAGNOSIS — Z853 Personal history of malignant neoplasm of breast: Secondary | ICD-10-CM | POA: Insufficient documentation

## 2020-06-18 NOTE — Assessment & Plan Note (Signed)
Taking iron  Iron level and ferritin today  Hb 11.5  Normocytic Has renal insufficiency

## 2020-06-18 NOTE — Assessment & Plan Note (Signed)
Under care of cardiology, no clinical changes

## 2020-06-18 NOTE — Assessment & Plan Note (Signed)
Under care of endocrinology  Per pt last a1c was in the 6-7 range  Diet is fair

## 2020-06-18 NOTE — Assessment & Plan Note (Signed)
Pt sees nephrology-has f/u in fev Diabetic nephropathy  Also anemia  Cr 1.35  K is slt elevated - baseline for her /does take arb Needs to drink more fluids

## 2020-06-18 NOTE — Assessment & Plan Note (Signed)
Pt sees dermatology and had iron def  Taking iron  Last cbc -hb was 11.5 (new)  Iron and ferritin levels done

## 2020-06-18 NOTE — Assessment & Plan Note (Signed)
Reviewed health habits including diet and exercise and skin cancer prevention Reviewed appropriate screening tests for age  Also reviewed health mt list, fam hx and immunization status , as well as social and family history   See HPI Labs reviewed  Mammogram is due in June (h/o breast cancer on letrozole) , sent for last report from [facility] due in feb, no falls or fractures  Disc chair exercise program  covid immunized

## 2020-06-18 NOTE — Assessment & Plan Note (Signed)
With renal insuff  Sees nephrology  Taking arb/diabetic

## 2020-06-18 NOTE — Assessment & Plan Note (Signed)
Taking letrozole  Mammogram due in june

## 2020-06-18 NOTE — Assessment & Plan Note (Signed)
Disc goals for lipids and reasons to control them Rev last labs with pt Rev low sat fat diet in detail Taking atorvastatin 80 mg daily LDL is 46 Trig up slightly poss due to glucose

## 2020-06-18 NOTE — Assessment & Plan Note (Signed)
TSH and FT4 done  Pt has some fatigue  Baseline hair loss

## 2020-06-18 NOTE — Assessment & Plan Note (Addendum)
dexa 2/20 -due for 2 y rec in feb No falls or fx  Taking vit D  Discussed trying chair exercise  Taking letrozole -aware

## 2020-06-18 NOTE — Assessment & Plan Note (Signed)
bp in fair control at this time  BP Readings from Last 1 Encounters:  06/16/20 118/68   No changes needed Most recent labs reviewed  Disc lifstyle change with low sodium diet and exercise  Plan to continue Carvedilol 12.5 mg bid Hydralazine 50 mg bid imdur 30 mg daily  Losartan hct 50-12.5 mg daily

## 2020-06-19 DIAGNOSIS — Z95810 Presence of automatic (implantable) cardiac defibrillator: Secondary | ICD-10-CM | POA: Diagnosis not present

## 2020-06-20 ENCOUNTER — Ambulatory Visit: Payer: Medicare HMO

## 2020-06-20 ENCOUNTER — Telehealth: Payer: Self-pay

## 2020-06-20 NOTE — Telephone Encounter (Signed)
Sent text to number [phone] for virtual visit to complete AWV. This is the number listed to contact in the notes section when visit was scheduled. Waited 5 minutes no one ever connected to visit. Called this number to try to reach patient and recording said that this was [employer]. I hung up and called temporary home number [phone] listed in chart and received no answered. I had to cancel appointment due to unable to reach patient.

## 2020-07-12 DIAGNOSIS — Z961 Presence of intraocular lens: Secondary | ICD-10-CM | POA: Diagnosis not present

## 2020-07-17 DIAGNOSIS — N1832 Chronic kidney disease, stage 3b: Secondary | ICD-10-CM | POA: Diagnosis not present

## 2020-07-17 DIAGNOSIS — E1122 Type 2 diabetes mellitus with diabetic chronic kidney disease: Secondary | ICD-10-CM | POA: Diagnosis not present

## 2020-07-19 DIAGNOSIS — N183 Chronic kidney disease, stage 3 unspecified: Secondary | ICD-10-CM | POA: Diagnosis not present

## 2020-07-19 DIAGNOSIS — N3281 Overactive bladder: Secondary | ICD-10-CM | POA: Diagnosis not present

## 2020-07-19 DIAGNOSIS — E1122 Type 2 diabetes mellitus with diabetic chronic kidney disease: Secondary | ICD-10-CM | POA: Diagnosis not present

## 2020-07-19 DIAGNOSIS — N390 Urinary tract infection, site not specified: Secondary | ICD-10-CM | POA: Diagnosis not present

## 2020-07-19 DIAGNOSIS — I129 Hypertensive chronic kidney disease with stage 1 through stage 4 chronic kidney disease, or unspecified chronic kidney disease: Secondary | ICD-10-CM | POA: Diagnosis not present

## 2020-07-19 DIAGNOSIS — D649 Anemia, unspecified: Secondary | ICD-10-CM | POA: Diagnosis not present

## 2020-07-19 DIAGNOSIS — K219 Gastro-esophageal reflux disease without esophagitis: Secondary | ICD-10-CM | POA: Diagnosis not present

## 2020-07-19 DIAGNOSIS — N179 Acute kidney failure, unspecified: Secondary | ICD-10-CM | POA: Diagnosis not present

## 2020-07-24 ENCOUNTER — Other Ambulatory Visit: Payer: Self-pay | Admitting: Internal Medicine

## 2020-07-24 DIAGNOSIS — E785 Hyperlipidemia, unspecified: Secondary | ICD-10-CM | POA: Diagnosis not present

## 2020-07-24 DIAGNOSIS — N1832 Chronic kidney disease, stage 3b: Secondary | ICD-10-CM | POA: Diagnosis not present

## 2020-07-24 DIAGNOSIS — N183 Chronic kidney disease, stage 3 unspecified: Secondary | ICD-10-CM

## 2020-07-24 DIAGNOSIS — E1169 Type 2 diabetes mellitus with other specified complication: Secondary | ICD-10-CM | POA: Diagnosis not present

## 2020-07-24 DIAGNOSIS — E1122 Type 2 diabetes mellitus with diabetic chronic kidney disease: Secondary | ICD-10-CM | POA: Diagnosis not present

## 2020-07-25 DIAGNOSIS — H34832 Tributary (branch) retinal vein occlusion, left eye, with macular edema: Secondary | ICD-10-CM | POA: Diagnosis not present

## 2020-08-01 ENCOUNTER — Ambulatory Visit
Admission: RE | Admit: 2020-08-01 | Discharge: 2020-08-01 | Disposition: A | Discharge status: Home / Self Care | Payer: Medicare HMO | Source: Ambulatory Visit | Attending: Internal Medicine | Admitting: Internal Medicine

## 2020-08-01 DIAGNOSIS — N183 Chronic kidney disease, stage 3 unspecified: Secondary | ICD-10-CM | POA: Diagnosis not present

## 2020-09-04 ENCOUNTER — Other Ambulatory Visit: Payer: Self-pay | Admitting: Family Medicine

## 2020-09-04 DIAGNOSIS — Z95810 Presence of automatic (implantable) cardiac defibrillator: Secondary | ICD-10-CM | POA: Diagnosis not present

## 2020-09-05 NOTE — Telephone Encounter (Signed)
Name of [place] Name of [place] or Written Date and Quantity:06/06/20#60 tabs with 0 refills Last Office Visit and Type:CPE/AWV 06/16/20 Next Office Visit and Type:none scheduled

## 2020-09-09 DIAGNOSIS — Z95810 Presence of automatic (implantable) cardiac defibrillator: Secondary | ICD-10-CM | POA: Diagnosis not present

## 2020-09-11 ENCOUNTER — Telehealth: Payer: Self-pay

## 2020-09-11 DIAGNOSIS — Z20822 Contact with and (suspected) exposure to covid-19: Secondary | ICD-10-CM | POA: Diagnosis not present

## 2020-09-11 NOTE — Telephone Encounter (Signed)
[facility] - Client TELEPHONE ADVICE RECORD AccessNurse Patient Name: NADALYN DERINGER MS Gender: Female DOB: 10-16-1934 Age: 86 Y 79 M 3 D Return Phone Number: [phone] (Primary), [phone] (Secondary) Address[place] [address] Client [facility] - Client Client Site [place] [facility], Roque Lias - MD Contact Type Call Who Is Calling Patient / Member / Family / Caregiver Call Type Triage / Clinical Relationship To Patient Self Return Phone Number [phone] (Primary) Chief Complaint Nasal Congestion Reason for Call Symptomatic / Request for Health Information Initial Comment Caller states, congested, coughing. Please advise. No fever. Office has no appt. today. Was transferred to [place] [place] [facility] or [facility] Translation No Nurse Assessment Nurse: Hardin Negus, RN, Danae Chen Date/Time Eilene Ghazi Time): 09/11/2020 9:34:41 AM Confirm and document reason for call. If symptomatic, describe symptoms. ---Caller states she has stuffy nose with cough. Symptoms started last Wednesday. Frequent dry cough. No fever. No headache/sore throat/earache. Does the patient have any new or worsening symptoms? ---Yes Will a triage be completed? ---Yes Related visit to physician within the last 2 weeks? ---No Does the PT have any chronic conditions? (i.e. diabetes, asthma, this includes High risk factors for pregnancy, etc.) ---Yes List chronic conditions. ---diabetes heart condition CA- takes chemo pill Is this a behavioral health or substance abuse call? ---No Guidelines Guideline Title Affirmed Question Affirmed Notes Nurse Date/Time (Eastern Time) Cough - Acute Non- Productive [1] MODERATE difficulty breathing (e.g., speaks in phrases, SOB even at rest, pulse 100-120) AND [2] still present when not coughing Hardin Negus, RN, Danae Chen 09/11/2020  9:40:09 AM PLEASE NOTE: All timestamps contained within this report are represented as [place] Standard Time. CONFIDENTIALTY NOTICE: This fax transmission is intended only for the addressee. It contains information that is legally privileged, confidential or otherwise protected from use or disclosure. If you are not the intended recipient, you are strictly prohibited from reviewing, disclosing, copying using or disseminating any of this information or taking any action in reliance on or regarding this information. If you have received this fax in error, please notify us immediately by telephone so that we can arrange for its return to [place]. Phone: [phone], Toll-Free: [phone], Fax: [phone] Page: 2 of 2 Call Id: [id number] [place]. Time Eilene Ghazi Time) Disposition Final User 09/11/2020 9:44:22 AM Go to ED Now Yes Hardin Negus, RN, Dierdre Highman Disagree/Comply Comply Caller Understands Yes PreDisposition InappropriateToAsk Care Advice Given Per Guideline GO TO ED NOW: * You need to be seen in the Emergency Department. * Go to the ED at ___________ [place] now. Drive carefully. ANOTHER ADULT SHOULD DRIVE: * It is better and safer if another adult drives instead of you. BRING MEDICINES: * Bring a list of your current medicines when you go to the Emergency Department (ER). CALL EMS 911 IF: * Severe difficulty breathing occurs * Lips or face turns blue * Confusion occurs. CARE ADVICE given per Cough - Acute Non- Productive (Adult) guideline. Comments User: Ferdinand Cava, RN Date/Time Eilene Ghazi Time): 09/11/2020 9:38:05 AM *no known covid/flu exposure in last 2 weeks Referrals GO TO FACILITY OTHER - SPECIFY

## 2020-09-11 NOTE — Telephone Encounter (Signed)
I spoke with pt and she is presently at [facility] to be eval and testing if needed. Sending note to Dr [name] as Juluis Rainier.

## 2020-09-25 DIAGNOSIS — H34832 Tributary (branch) retinal vein occlusion, left eye, with macular edema: Secondary | ICD-10-CM | POA: Diagnosis not present

## 2020-09-26 NOTE — Progress Notes (Signed)
Cardiology Office Note  Date:  09/27/2020   ID:  Morgan Hubbard, Morgan Hubbard Oct 20, 1934, MRN [id number]  PCP:  Morgan Greenspan, MD   Chief Complaint  Patient presents with  . 12 month follow up     "doing well." Medications reviewed by the patient verbally.     HPI:  Morgan Hubbard is a very pleasant 86 year old woman with past medical history of  nonischemic cardiomyopathy,  Biventricular ICD placed May 2016  Cath 2012 with no CAD ejection fraction of 30% in 2009  left bundle branch block,  TIA/stroke August 2015. had difficulty speaking, Walking nephrectomy/donated kidney in 1989,  possible angioedema on lisinopril,  back surgery x3,  minimal carotid b/l in 2015, carotid bruit on the right poorly controlled diabetes Hemoglobin A1c 9.4 to 7.9, She presents for routine followup Of her cardiomyopathy  Ejection fraction of 15-20% in 2015,  Up to 35% in August 2016,  Up to 40% in 2017 Up to 55% in 06/2017 down to 40 to 45% in 06/2018  Getting over bronchitis, ABX x2 Still coughing up some  Reports blood pressure elevated at home typically 915-0 60 systolic No change in weight Reports compliance with her medications  Continues on aspirin 325 mg daily for previous TIAs/strokes Prior to that was on aspirin with Plavix  Chronic pain  Sedentary, no regular exercise program, legs getting weaker Goes RV camping once a month but does not do much activity when she goes  EKG personally reviewed by myself on todays visit Shows paced rhythm rate 70 bpm  MRI results reviewed and discussed in detail MRI brain/MRA head w/o contrast 03/08/19: 1. No acute intracranial process. Left frontal lobe encephalomalacia, likely due to prior ischemia.  2. Unchanged high grade narrowing of the left MCA distal M1 segment.  Lab Results  Component Value Date   CHOL 99 06/12/2020   HDL 33.20 (L) 06/12/2020   LDLCALC 46 06/10/2019   TRIG 209.0 (H) 06/12/2020    Other past medical history  reviewed TIA/stroke August 2015. had difficulty speaking, Walking Initially placed on aspirin, Plavix, been changed by neurology to aspirin 325 mg daily , Plavix held  She continues to follow[facility]up [facility]  with cardiology and EP Biventricular ICD placed May 2016 for shortness of breath symptoms placed by Morgan Hubbard  Total cholesterol 2016 was 86 , takes Lipitor 80 mg daily  other past medical history History ofsevere back disease she has significant neuropathy in her legs.   Echocardiogram was done for her fatigue and shortness of breath. ejection fraction of 25-30% which was significant drop from prior ejection fraction February 2013 (45%) done at [place]. normal RVSP.  Prior cardiac catheterization 08/30/2010 in [place] showed no significant coronary artery disease  PMH:   has a past medical history of Arthritis, Basal cell carcinoma (01/2014), Cardiac LV ejection fraction >40%, Chronic lower back pain, CKD (chronic kidney disease), stage III ([place]), Colon polyps, Expressive aphasia, GERD (gastroesophageal reflux disease), Goiter (past remote), Hyperlipidemia, Hyperpotassemia, Hypertension, Hypertonicity of bladder, Inflammatory and toxic neuropathy, unspecified, LBBB (left bundle branch block), Migraine, Mini stroke ([place]) (01/2014), Other primary cardiomyopathies, Overactive bladder, Presence of combination internal cardiac defibrillator (ICD) and pacemaker, Reflex sympathetic dystrophy, unspecified, Shingles, Shortness of breath, Stroke ([place]), Thyroid disease, Type II diabetes mellitus ([place]), Ulcer, Urine incontinence, and Vertigo.  PSH:    Past Surgical History:  Procedure Laterality Date  . BACK SURGERY    . BI-VENTRICULAR PACEMAKER INSERTION (CRT-P)  10/2014   [facility]  .  BREAST CYST EXCISION Left 1959  . CARDIAC CATHETERIZATION  "several"   Morgan Hubbard  . CATARACT EXTRACTION W/ INTRAOCULAR LENS IMPLANT Left ~ 2005   several eye injections  . CATARACT EXTRACTION  W/PHACO Right 05/04/2020   Procedure: CATARACT EXTRACTION PHACO AND INTRAOCULAR LENS PLACEMENT ([place]) RIGHT DIABETIC;  Surgeon: Morgan Robson, MD;  Location: [facility] ORS;  Service: Ophthalmology;  Laterality: Right;  [place] 00:58.5 CDE 6.70 Fluid Pack [place] # [id number]  . CHOLECYSTECTOMY N/A 06/13/2018   Procedure: LAPAROSCOPIC CHOLECYSTECTOMY;  Surgeon: Morgan Mesa, MD;  Location: [facility] [facility];  Service: General;  Laterality: N/A;  . COLONOSCOPY  7/12   normal (hx of polyps in past)   . CT SCAN  3/12   outside hosp- lung nodule and gallstones  . ERCP N/A 06/12/2018   Procedure: ENDOSCOPIC RETROGRADE CHOLANGIOPANCREATOGRAPHY (ERCP);  Surgeon: Morgan Ada, MD;  Location: [place];  Service: Endoscopy;  Laterality: N/A;  . [place]   "knot on index"  . KIDNEY DONATION Left 1989  . LUMBAR LAMINECTOMY/DECOMPRESSION MICRODISCECTOMY  1999  . LUMBAR LAMINECTOMY/DECOMPRESSION MICRODISCECTOMY  01/31/2012   Procedure: LUMBAR LAMINECTOMY/DECOMPRESSION MICRODISCECTOMY 2 LEVELS;  Surgeon: Morgan Moore, MD;  Location: [place] NEURO ORS;  Service: Neurosurgery;  Laterality: N/A;  Thoracic twelve-lumbar one, lumbar one-two laminectomy   . POSTERIOR LAMINECTOMY / DECOMPRESSION CERVICAL SPINE  1999  . REMOVAL OF STONES  06/12/2018   Procedure: REMOVAL OF STONES;  Surgeon: Morgan Ada, MD;  Location: [facility] ENDOSCOPY;  Service: Endoscopy;;  . Joan Mayans  06/12/2018   Procedure: Joan Mayans;  Surgeon: Morgan Ada, MD;  Location: [facility] ENDOSCOPY;  Service: Endoscopy;;  . [place]  . UPPER GASTROINTESTINAL ENDOSCOPY      Current Outpatient Medications  Medication Sig Dispense Refill  . aspirin EC 81 MG tablet Take 1 tablet (81 mg total) by mouth daily. Swallow whole. 90 tablet 3  . atorvastatin (LIPITOR) 80 MG tablet Take 80 mg by mouth at bedtime.     . Biotin w/ Vitamins C & E (HAIR/SKIN/NAILS PO) Take 1 tablet by mouth daily.     . carvedilol (COREG) 12.5 MG tablet Take 1  tablet (12.5 mg total) by mouth 2 (two) times daily with a meal. 180 tablet 4  . cetirizine (ZYRTEC) 10 MG tablet Take 10 mg by mouth 2 (two) times daily.    . clopidogrel (PLAVIX) 75 MG tablet Take 1 tablet (75 mg total) by mouth daily. 90 tablet 3  . clotrimazole-betamethasone (LOTRISONE) cream Apply 1 application topically as needed (groin).     . dapsone 25 MG tablet Take 25 mg by mouth daily.     . ferrous sulfate 325 (65 FE) MG tablet Take 325 mg by mouth in the morning and at bedtime.    . gabapentin (NEURONTIN) 300 MG capsule Take 1 capsule (300 mg total) by mouth 3 (three) times daily. 270 capsule 3  . glimepiride (AMARYL) 2 MG tablet Take 2 mg by mouth daily.     Marland Kitchen glucose blood test strip Use 3 (three) times daily.    . hydrALAZINE (APRESOLINE) 50 MG tablet Take 50 mg by mouth 2 (two) times daily.     Marland Kitchen letrozole (FEMARA) 2.5 MG tablet Take 2.5 mg by mouth daily.    Marland Kitchen losartan-hydrochlorothiazide (HYZAAR) 100-12.5 MG tablet Take 1 tablet by mouth daily. 90 tablet 3  . metFORMIN (GLUCOPHAGE) 1000 MG tablet Take 1,000 mg by mouth in the morning and at bedtime.     Marland Kitchen MILK THISTLE  PO Take 1 capsule by mouth 2 (two) times daily.     . mirabegron ER ([name]) 50 MG TB24 tablet Take 1 tablet (50 mg total) by mouth at bedtime. 90 tablet 3  . nystatin cream (MYCOSTATIN) Apply 1 application topically 2 (two) times daily as needed (irritation.).     Marland Kitchen pantoprazole (PROTONIX) 40 MG tablet Take 1 tablet (40 mg total) by mouth daily. 90 tablet 3  . traMADol (ULTRAM) 50 MG tablet TAKE 1 TO 2 TABLETS BY MOUTH EVERY 8 HOURS AS NEEDED FOR MODERATE TO SEVERE PAIN 60 tablet 0  . vitamin B-12 (CYANOCOBALAMIN) 1000 MCG tablet Take 1,000 mcg by mouth daily.     . Vitamin D, Ergocalciferol, (DRISDOL) 1.25 MG (50000 UT) CAPS capsule Take 50,000 Units by mouth every Monday.     . isosorbide mononitrate (IMDUR) 30 MG 24 hr tablet Take 1 tablet (30 mg total) by mouth in the morning and at bedtime. 180 tablet 3    No current facility-administered medications for this visit.     Allergies:   Ace inhibitors, Codeine, Hydrochlorothiazide, Insulin detemir, Lisinopril, Nsaids, and Tylenol [acetaminophen]   Social History:  The patient  reports that she has never smoked. She has never used smokeless tobacco. She reports that she does not drink alcohol and does not use drugs.   Family History:   family history includes Addison's disease in an other family member; Alcohol abuse in her father; Arthritis in her mother; Cancer in her brother, mother, and sister; Diabetes in her brother, daughter, father, sister, and sister; Heart disease in her sister; Hyperlipidemia in her brother, mother, and sister; Hypertension in her mother and sister; Kidney disease in her brother; Kidney failure in her brother; Stroke in her mother.    Review of Systems: Review of Systems  HENT: Negative.   Respiratory: Negative.   Cardiovascular: Negative.   Gastrointestinal: Negative.   Musculoskeletal: Negative.        Gait instability  Neurological: Positive for weakness.  Psychiatric/Behavioral: Negative.   All other systems reviewed and are negative.   PHYSICAL EXAM: VS:  BP (!) 160/60 (BP Location: Left Arm, Patient Position: Sitting, Cuff Size: Normal)   Pulse 70   Ht 5\' 1"  (1.549 m)   Wt 146 lb 8 oz (66.5 kg)   LMP 06/03/1992   SpO2 97%   BMI 27.68 kg/m  , BMI Body mass index is 27.68 kg/m. Constitutional:  oriented to person, place, and time. No distress.  HENT:  Head: Grossly normal Eyes:  no discharge. No scleral icterus.  Neck: No JVD, no carotid bruits  Cardiovascular: Regular rate and rhythm, no murmurs appreciated Pulmonary/Chest: Clear to auscultation bilaterally, no wheezes or rails Abdominal: Soft.  no distension.  no tenderness.  Musculoskeletal: Normal range of motion Neurological:  normal muscle tone. Coordination normal. No atrophy Skin: Skin warm and dry Psychiatric: normal affect,  pleasant   Recent Labs: 06/12/2020: ALT 14; BUN 22; Creatinine, Ser 1.35; Hemoglobin 11.5; Platelets 229.0; Potassium 5.3 No hemolysis seen; Sodium 141 06/16/2020: TSH 2.62    Lipid Panel Lab Results  Component Value Date   CHOL 99 06/12/2020   HDL 33.20 (L) 06/12/2020   LDLCALC 46 06/10/2019   TRIG 209.0 (H) 06/12/2020      Wt Readings from Last 3 Encounters:  09/27/20 146 lb 8 oz (66.5 kg)  06/16/20 146 lb 6 oz (66.4 kg)  05/04/20 141 lb 15.6 oz (64.4 kg)     ASSESSMENT AND PLAN:  HYPERCHOLESTEROLEMIA Cholesterol  is at goal on the current lipid regimen. No changes to the medications were made.  Stable  Essential hypertension Elevated, plan as below double her Imdur, increase losartan 100/12.5 daily  Nonischemic cardiomyopathy ([place]) echocardiogram from 2019  normal ejection fraction Ejection fraction 40 to 45% January 2020 Continue Coreg, hydralazine, Imdur, losartan/hct Stressed importance of better blood pressure control, will double Imdur up to 30 twice daily, losartan up to 100/12.5 daily Recommend she call us with blood pressure measurements Discussed [place].  Would need to monitor potassium if started  Cerebral thrombosis with cerebral infarction [facility]) Long discussion concerning cerebrovascular disease, neurology notes reviewed, prior MRI No further episodes,  Suggested Plavix 75 with aspirin 81 rather than aspirin 325  Type 2 diabetes mellitus without complication, unspecified long term insulin use status (HCC) hemoglobin A1c 5.2 Stable Consider Farxiga for cardiac benefits but she does have high potassium, concern for hypovolemia  Shortness of breath  deconditioning, unable to exercise Recommend regular walking program   Total encounter time more than 45 minutes  Greater than 50% was spent in counseling and coordination of care with the patient    Orders Placed This Encounter  Procedures  . EKG 12-Lead    Total encounter time more than 25  minutes  Greater than 50% was spent in counseling and coordination of care with the patient   Signed, Esmond Plants, M.D., Ph.D. 09/27/2020  [place], [place]

## 2020-09-27 ENCOUNTER — Other Ambulatory Visit: Payer: Self-pay

## 2020-09-27 ENCOUNTER — Ambulatory Visit (INDEPENDENT_AMBULATORY_CARE_PROVIDER_SITE_OTHER): Payer: Medicare HMO | Admitting: Cardiovascular Disease

## 2020-09-27 ENCOUNTER — Encounter: Payer: Self-pay | Admitting: Cardiovascular Disease

## 2020-09-27 VITALS — BP 160/60 | HR 70 | Ht 61.0 in | Wt 146.5 lb

## 2020-09-27 DIAGNOSIS — I5022 Chronic systolic (congestive) heart failure: Secondary | ICD-10-CM

## 2020-09-27 DIAGNOSIS — E7800 Pure hypercholesterolemia, unspecified: Secondary | ICD-10-CM | POA: Diagnosis not present

## 2020-09-27 DIAGNOSIS — R0602 Shortness of breath: Secondary | ICD-10-CM

## 2020-09-27 DIAGNOSIS — E119 Type 2 diabetes mellitus without complications: Secondary | ICD-10-CM

## 2020-09-27 DIAGNOSIS — I428 Other cardiomyopathies: Secondary | ICD-10-CM | POA: Diagnosis not present

## 2020-09-27 DIAGNOSIS — I10 Essential (primary) hypertension: Secondary | ICD-10-CM

## 2020-09-27 MED ORDER — ISOSORBIDE MONONITRATE ER 30 MG PO TB24
30.0000 mg | ORAL_TABLET | Freq: Two times a day (BID) | ORAL | 3 refills | Status: DC
Start: 2020-09-27 — End: 2021-03-26

## 2020-09-27 MED ORDER — ASPIRIN EC 81 MG PO TBEC: 81.0000 mg | DELAYED_RELEASE_TABLET | Freq: Every day | ORAL | 3 refills | Status: DC

## 2020-09-27 MED ORDER — CLOPIDOGREL BISULFATE 75 MG PO TABS: 75.0000 mg | ORAL_TABLET | Freq: Every day | ORAL | 3 refills | Status: DC

## 2020-09-27 MED ORDER — LOSARTAN POTASSIUM-HCTZ 100-12.5 MG PO TABS: 1.0000 | ORAL_TABLET | Freq: Every day | ORAL | 3 refills | Status: DC

## 2020-09-27 NOTE — Patient Instructions (Addendum)
Medication Instructions:   Aspirin 81 mg once daily   Plavix 75 mg once daily   IMDUR 30 mg take two times a day   Losartan HCTZ 100-12.5once daily  If you need a refill on your cardiac medications before your next appointment, please call your pharmacy.    Lab work: No new labs needed  Testing/Procedures: No new testing needed   Follow-Up:  . You will need a follow up appointment in 6 months  . Providers on your designated Care Team:   . Murray Hodgkins, NP . Christell Faith, PA-C . Marrianne Mood, PA-C   COVID-19 Vaccine Information can be found at: ShippingScam.co.uk For questions related to vaccine distribution or appointments, please email vaccine@[email or web address] .[email or web address] or call [phone].

## 2020-10-20 ENCOUNTER — Other Ambulatory Visit: Payer: Self-pay | Admitting: Family Medicine

## 2020-10-27 ENCOUNTER — Other Ambulatory Visit: Payer: Self-pay | Admitting: Family Medicine

## 2020-11-10 ENCOUNTER — Other Ambulatory Visit: Payer: Self-pay | Admitting: Family Medicine

## 2020-11-10 NOTE — Telephone Encounter (Signed)
Name of Medication: tramadol Name of Pharmacy: [facility] or Written Date and Quantity:09/05/20 #60 tabs with 0 refills Last Office Visit and Type: CPE/AWV 06/16/20 Next Office Visit and Type: none scheduled

## 2020-11-17 DIAGNOSIS — I428 Other cardiomyopathies: Secondary | ICD-10-CM | POA: Diagnosis not present

## 2020-11-17 DIAGNOSIS — Z95810 Presence of automatic (implantable) cardiac defibrillator: Secondary | ICD-10-CM | POA: Diagnosis not present

## 2020-11-22 ENCOUNTER — Telehealth: Payer: Self-pay

## 2020-11-22 NOTE — Chronic Care Management (AMB) (Addendum)
Chronic Care Management Pharmacy Assistant   Name: Morgan Hubbard  MRN: [id number] DOB: 03/30/1935  Reason for Encounter: Disease State CHF   Recent office visits:  06/16/20 - Dr.Tower PCP labs ordered no medication changes  Recent consult visits:  09/27/20 - Cardiology - Increased Imdur up to 30 twice daily, losartan up to 100/12.5 daily. Suggested Plavix 75 with aspirin 81 rather than aspirin 325 mg. Consider [name] for cardiac benefits.  09/11/20 [name]  no data found 07/05/20 - Ophthalmology no data found 07/24/20 - Endocrinology  no mediction changes  07/19/20 - Nephrology no data found 07/17/20 - Endocrinology no data found 06/19/20 - Internal Medicine  no data found 06/14/20 - Dermatology no data found 06/05/20 - Internal Medicine no data found    Hospital visits:  None in previous 6 months  Medications: Outpatient Encounter Medications as of 11/22/2020  Medication Sig   aspirin EC 81 MG tablet Take 1 tablet (81 mg total) by mouth daily. Swallow whole.   atorvastatin (LIPITOR) 80 MG tablet Take 80 mg by mouth at bedtime.    Biotin w/ Vitamins C & E (HAIR/SKIN/NAILS PO) Take 1 tablet by mouth daily.    carvedilol (COREG) 12.5 MG tablet Take 1 tablet (12.5 mg total) by mouth 2 (two) times daily with a meal.   cetirizine (ZYRTEC) 10 MG tablet Take 10 mg by mouth 2 (two) times daily.   clopidogrel (PLAVIX) 75 MG tablet Take 1 tablet (75 mg total) by mouth daily.   clotrimazole-betamethasone (LOTRISONE) cream Apply 1 application topically as needed (groin).    dapsone 25 MG tablet Take 25 mg by mouth daily.    ferrous sulfate 325 (65 FE) MG tablet Take 325 mg by mouth in the morning and at bedtime.   gabapentin (NEURONTIN) 300 MG capsule Take 1 capsule (300 mg total) by mouth 3 (three) times daily.   glimepiride (AMARYL) 2 MG tablet Take 2 mg by mouth daily.    glucose blood test strip Use 3 (three) times daily.   hydrALAZINE (APRESOLINE) 50 MG tablet Take 50 mg by mouth 2  (two) times daily.    isosorbide mononitrate (IMDUR) 30 MG 24 hr tablet Take 1 tablet (30 mg total) by mouth in the morning and at bedtime.   letrozole (FEMARA) 2.5 MG tablet Take 2.5 mg by mouth daily.   losartan-hydrochlorothiazide (HYZAAR) 100-12.5 MG tablet Take 1 tablet by mouth daily.   metFORMIN (GLUCOPHAGE) 1000 MG tablet Take 1,000 mg by mouth in the morning and at bedtime.    MILK THISTLE PO Take 1 capsule by mouth 2 (two) times daily.    mirabegron ER ([name]) 50 MG TB24 tablet Take 1 tablet (50 mg total) by mouth at bedtime.   nystatin cream (MYCOSTATIN) Apply 1 application topically 2 (two) times daily as needed (irritation.).    pantoprazole (PROTONIX) 40 MG tablet Take 1 tablet (40 mg total) by mouth daily.   traMADol (ULTRAM) 50 MG tablet TAKE 1 TO 2 TABLETS BY MOUTH EVERY 8 HOURS AS NEEDED FOR MODERATE TO SEVERE PAIN   vitamin B-12 (CYANOCOBALAMIN) 1000 MCG tablet Take 1,000 mcg by mouth daily.    Vitamin D, Ergocalciferol, (DRISDOL) 1.25 MG (50000 UT) CAPS capsule Take 50,000 Units by mouth every Monday.    No facility-administered encounter medications on file as of 11/22/2020.   Chronic Systolic Heart Failure  Current Chronic Heart Failure regimen: Carvedilol 12.5mg  1 tablet 2 times daily  Hydralazine 50mg   1 tablet 2 times  daily Imdur 30mg   1 tablet 2 times daily  Losartan/HCTZ 100-12.5mg   1 tablet daily   Are you taking a diuretic? No  Do you weigh yourself daily or regularly? No the patient reports every MD she goes to weighs her and she goes often  Have any of the following symptoms worsened or changed from your baseline?   denies worsening of SOB, increased swelling, or abnormal weight gain of more than 3 pounds in one day or 5 pounds in one week  Do you see a cardiologist? Yes             Name: Morgan Rogue MD Last visit?  09/27/2020 Upcoming visit? 6 months  The patient reports recent values of her ejection fraction went from <25  at the beginning to  now she is at 80 and she reports more energy and feels good.  How often are you checking your Blood Pressure? when feeling symptomatic - Cardiology recommended home monitoring at last visit   DATE:             BP               PULSE 11/27/20 128/76  79 11/22/20 150/70  81 11/20/20 116/76  76  Wrist or arm cuff:arm cuff Caffeine intake: diet soda decaf coffee  Salt intake: limits and wont add to food  OTC medications including pseudoephedrine or NSAIDs? None Exercise habits: she enjoys camping and tries to go monthly    Star Rating Drugs:  Medication:   Last Fill: Day Supply Losartan 100-12.5 mg  09/27/20 90  Metformin 1000mg   10/20/20 90 Glimepiride 2 mg  10/21/20 90  Atorvastatin 80mg   09/01/20  [place], CPP notified  [name], [name] Assistant [phone]  I have reviewed the care management and care coordination activities outlined in this encounter and I am certifying that I agree with the content of this note. No further action required.  [name], PharmD Clinical Pharmacist [facility] [phone]

## 2020-11-27 DIAGNOSIS — R490 Dysphonia: Secondary | ICD-10-CM | POA: Diagnosis not present

## 2020-11-27 DIAGNOSIS — Z1379 Encounter for other screening for genetic and chromosomal anomalies: Secondary | ICD-10-CM | POA: Diagnosis not present

## 2020-11-27 DIAGNOSIS — C50919 Malignant neoplasm of unspecified site of unspecified female breast: Secondary | ICD-10-CM | POA: Diagnosis not present

## 2020-11-27 DIAGNOSIS — Z853 Personal history of malignant neoplasm of breast: Secondary | ICD-10-CM | POA: Diagnosis not present

## 2020-11-27 DIAGNOSIS — M8580 Other specified disorders of bone density and structure, unspecified site: Secondary | ICD-10-CM | POA: Diagnosis not present

## 2020-11-27 DIAGNOSIS — C50311 Malignant neoplasm of lower-inner quadrant of right female breast: Secondary | ICD-10-CM | POA: Diagnosis not present

## 2020-11-27 DIAGNOSIS — R053 Chronic cough: Secondary | ICD-10-CM | POA: Diagnosis not present

## 2020-11-27 DIAGNOSIS — Z170 Estrogen receptor positive status [ER+]: Secondary | ICD-10-CM | POA: Diagnosis not present

## 2020-11-30 DIAGNOSIS — H348321 Tributary (branch) retinal vein occlusion, left eye, with retinal neovascularization: Secondary | ICD-10-CM | POA: Diagnosis not present

## 2020-12-07 ENCOUNTER — Telehealth: Payer: Self-pay

## 2020-12-07 NOTE — Progress Notes (Signed)
Follow up scheduled for 12/22/20 at 1:00.

## 2020-12-07 NOTE — Chronic Care Management (AMB) (Signed)
Chronic Care Management Pharmacy Assistant   Name: Morgan Hubbard  MRN: [id number] DOB: 1934-11-10  Reason for Encounter: Reschedule CCM Appointment  Recent office visits:  None since last CCM outreach.   Recent consult visits:  11/27/20 Radiology/oncology- [facility] visits:  None in previous 6 months  Medications: Outpatient Encounter Medications as of 12/07/2020  Medication Sig   aspirin EC 81 MG tablet Take 1 tablet (81 mg total) by mouth daily. Swallow whole.   atorvastatin (LIPITOR) 80 MG tablet Take 80 mg by mouth at bedtime.    Biotin w/ Vitamins C & E (HAIR/SKIN/NAILS PO) Take 1 tablet by mouth daily.    carvedilol (COREG) 12.5 MG tablet Take 1 tablet (12.5 mg total) by mouth 2 (two) times daily with a meal.   cetirizine (ZYRTEC) 10 MG tablet Take 10 mg by mouth 2 (two) times daily.   clopidogrel (PLAVIX) 75 MG tablet Take 1 tablet (75 mg total) by mouth daily.   clotrimazole-betamethasone (LOTRISONE) cream Apply 1 application topically as needed (groin).    dapsone 25 MG tablet Take 25 mg by mouth daily.    ferrous sulfate 325 (65 FE) MG tablet Take 325 mg by mouth in the morning and at bedtime.   gabapentin (NEURONTIN) 300 MG capsule Take 1 capsule (300 mg total) by mouth 3 (three) times daily.   glimepiride (AMARYL) 2 MG tablet Take 2 mg by mouth daily.    glucose blood test strip Use 3 (three) times daily.   hydrALAZINE (APRESOLINE) 50 MG tablet Take 50 mg by mouth 2 (two) times daily.    isosorbide mononitrate (IMDUR) 30 MG 24 hr tablet Take 1 tablet (30 mg total) by mouth in the morning and at bedtime.   letrozole (FEMARA) 2.5 MG tablet Take 2.5 mg by mouth daily.   losartan-hydrochlorothiazide (HYZAAR) 100-12.5 MG tablet Take 1 tablet by mouth daily.   metFORMIN (GLUCOPHAGE) 1000 MG tablet Take 1,000 mg by mouth in the morning and at bedtime.    MILK THISTLE PO Take 1 capsule by mouth 2 (two) times daily.    mirabegron ER ([name]) 50 MG TB24  tablet Take 1 tablet (50 mg total) by mouth at bedtime.   nystatin cream (MYCOSTATIN) Apply 1 application topically 2 (two) times daily as needed (irritation.).    pantoprazole (PROTONIX) 40 MG tablet Take 1 tablet (40 mg total) by mouth daily.   traMADol (ULTRAM) 50 MG tablet TAKE 1 TO 2 TABLETS BY MOUTH EVERY 8 HOURS AS NEEDED FOR MODERATE TO SEVERE PAIN   vitamin B-12 (CYANOCOBALAMIN) 1000 MCG tablet Take 1,000 mcg by mouth daily.    Vitamin D, Ergocalciferol, (DRISDOL) 1.25 MG (50000 UT) CAPS capsule Take 50,000 Units by mouth every Monday.    No facility-administered encounter medications on file as of 12/07/2020.   Morgan Hubbard was contacted to schedule telephone visit with [name]. Appointment was scheduled for  12/22/20 at 1:00. Patient was reminded to have all medications, supplements and any blood glucose and blood pressure readings available for review at appointment.   Are you having any problems with your medications? No  Do you have any concerns you like to discuss with the pharmacist? No    Star Rating Drugs: Medication:   Last Fill: Day Supply Losartan 100-12.5 mg             09/27/20            90  Metformin 1000mg                   10/20/20            90 Glimepiride 2 mg                     10/21/20            90         Atorvastatin 80mg                    12/04/20              [place], CPP notified  Margaretmary Dys, [place] Pharmacy Assistant [phone]

## 2020-12-19 DIAGNOSIS — Z95810 Presence of automatic (implantable) cardiac defibrillator: Secondary | ICD-10-CM | POA: Diagnosis not present

## 2020-12-19 DIAGNOSIS — Z8679 Personal history of other diseases of the circulatory system: Secondary | ICD-10-CM | POA: Diagnosis not present

## 2020-12-19 DIAGNOSIS — Z4502 Encounter for adjustment and management of automatic implantable cardiac defibrillator: Secondary | ICD-10-CM | POA: Diagnosis not present

## 2020-12-19 DIAGNOSIS — Z853 Personal history of malignant neoplasm of breast: Secondary | ICD-10-CM | POA: Diagnosis not present

## 2020-12-22 ENCOUNTER — Ambulatory Visit (INDEPENDENT_AMBULATORY_CARE_PROVIDER_SITE_OTHER): Payer: Medicare HMO

## 2020-12-22 ENCOUNTER — Other Ambulatory Visit: Payer: Self-pay

## 2020-12-22 DIAGNOSIS — I10 Essential (primary) hypertension: Secondary | ICD-10-CM

## 2020-12-22 DIAGNOSIS — E1129 Type 2 diabetes mellitus with other diabetic kidney complication: Secondary | ICD-10-CM

## 2020-12-22 NOTE — Progress Notes (Signed)
Chronic Care Management Pharmacy Note  12/22/2020 Name:  Morgan Hubbard MRN:  [id number] DOB:  1934/08/29  Subjective: Morgan Hubbard is an 86 y.o. year old female who is a primary patient of Tower, [name], MD.  The CCM team was consulted for assistance with disease management and care coordination needs.    Engaged with patient by telephone for follow up visit in response to provider referral for pharmacy case management and/or care coordination services.   Consent to Services:  The patient was given information about Chronic Care Management services, agreed to services, and gave verbal consent prior to initiation of services.  Please see initial visit note for detailed documentation.   Patient Care Team: Tower, [name], MD as PCP - General (Family Medicine) Carolan Clines, MD (Inactive) as Attending Physician (Urology) Minna Merritts, MD as Consulting Physician (Cardiology) Daryll Brod, MD as Referring Physician (Cardiology) Oneta Rack, MD as Consulting Physician (Dermatology) Ward, Jeani Hawking, MD as Referring Physician (Internal Medicine) Wynona Canes, MD as Referring Physician (Neurology) Kimmick, Linus Mako, MD as Referring Physician (Oncology) Gabriel Carina Betsey Holiday, MD as Physician Assistant (Internal Medicine) [name], MD as Consulting Physician (Family Medicine) Alvester Chou., DDS as Consulting Physician (Dentistry) Birder Robson, MD as Referring Physician (Ophthalmology) [name], [facility] as Pharmacist (Pharmacist)  Recent office visits: 06/16/20 - Dr. [name], PCP - Pt presented for AWV. Normal iron and thyroid levels. Increase water intake, 64 ounces daily.  Recent consult visits: 11/27/20 - Oncology - No evident of malignancy per mammogram. Repeat in 1 year. 09/27/20 - Cardiology - Follow up non-ischemic cardiomyopathy. Reports blood pressure elevated. Weight stable. Increase Imdur to 30 twice daily. Increase losartan to 100/12.5 daily.  Discontinue aspirin 325 mg. Start Plavix 75 mg and aspirin 81 mg. Consider [name] for cardiac benefits.  07/24/20 - Endocrinology - Diabetes well controlled. Continue metformin and glimepiride. No hypoglycemia. Followed with nephrology for CKD. Podiatry for nail care.   Hospital visits: None in previous 6 months   Objective:  Lab Results  Component Value Date   CREATININE 1.35 (H) 06/12/2020   BUN 22 06/12/2020   GFR 36.34 (L) 06/12/2020   GFRNONAA 35 (L) 06/15/2018   GFRAA 41 (L) 06/15/2018   NA 141 06/12/2020   K 5.3 No hemolysis seen (H) 06/12/2020   CALCIUM 9.3 06/12/2020   CO2 29 06/12/2020   GLUCOSE 160 (H) 06/12/2020    Lab Results  Component Value Date/Time   HGBA1C endo 01/03/2019 12:00 AM   HGBA1C 7.7 (H) 05/02/2017 09:40 AM   HGBA1C 9.0 (H) 04/22/2016 02:55 PM   GFR 36.34 (L) 06/12/2020 09:03 AM   GFR 43.80 (L) 06/10/2019 09:13 AM   MICROALBUR 78.0 (H) 04/22/2016 02:55 PM    Last diabetic Eye exam:  Lab Results  Component Value Date/Time   HMDIABEYEEXA No Retinopathy 03/31/2020 12:00 AM    Last diabetic Foot exam: referred to podiatry 02/22   Lab Results  Component Value Date   CHOL 99 06/12/2020   HDL 33.20 (L) 06/12/2020   LDLCALC 46 06/10/2019   LDLDIRECT 46.0 06/12/2020   TRIG 209.0 (H) 06/12/2020   CHOLHDL 3 06/12/2020    Hepatic Function Latest Ref Rng & Units 06/12/2020 06/10/2019 06/30/2018  Total Protein 6.0 - 8.3 g/dL 5.7(L) 5.8(L) 6.0  Albumin 3.5 - 5.2 g/dL 3.8 3.8 3.4(L)  AST 0 - 37 U/L _0 ALT 0 - 35 U/L _1 Alk Phosphatase 39 - 117 U/L  68 63 131(H)  Total Bilirubin 0.2 - 1.2 mg/dL 0.6 0.7 1.0  Bilirubin, Direct 0.0 - 0.3 mg/dL - - -    Lab Results  Component Value Date/Time   TSH 2.62 06/16/2020 11:18 AM   TSH 5.08 (H) 06/12/2020 09:03 AM   FREET4 1.46 06/16/2020 11:18 AM    CBC Latest Ref Rng & Units 06/12/2020 06/10/2019 06/30/2018  WBC 4.0 - 10.5 K/uL 7.5 7.9 5.8  Hemoglobin 12.0 - 15.0 g/dL 11.5(L) 12.3 10.9(L)   Hematocrit 36.0 - 46.0 % 35.2(L) 37.5 34.0(L)  Platelets 150.0 - 400.0 K/uL 229.0 220.0 269.0    No results found for: VD25OH  Clinical ASCVD: Yes  The ASCVD Risk score Mikey Bussing DC Jr., et al., 2013) failed to calculate for the following reasons:   The 2013 ASCVD risk score is only valid for ages 65 to 52   The patient has a prior MI or stroke diagnosis    Depression screen [facility] 2/9 06/16/2020 06/10/2019 06/09/2018  Decreased Interest 0 0 0  Down, Depressed, Hopeless 0 0 0  PHQ - 2 Score 0 0 0  Altered sleeping 1 0 -  Tired, decreased energy 1 0 -  Change in appetite 0 0 -  Feeling bad or failure about yourself  0 0 -  Trouble concentrating 0 0 -  Moving slowly or fidgety/restless 0 0 -  Suicidal thoughts 0 0 -  PHQ-9 Score 2 0 -  Difficult doing work/chores - Not difficult at all -  Some recent data might be hidden    Social History   Tobacco Use  Smoking Status Never  Smokeless Tobacco Never   BP Readings from Last 3 Encounters:  09/27/20 (!) 160/60  06/16/20 118/68  05/04/20 (!) 132/49   Pulse Readings from Last 3 Encounters:  09/27/20 70  06/16/20 66  05/04/20 (!) 59   Wt Readings from Last 3 Encounters:  09/27/20 146 lb 8 oz (66.5 kg)  06/16/20 146 lb 6 oz (66.4 kg)  05/04/20 141 lb 15.6 oz (64.4 kg)   BMI Readings from Last 3 Encounters:  09/27/20 27.68 kg/m  06/16/20 28.12 kg/m  05/04/20 26.83 kg/m    Assessment/Interventions: Review of patient past medical history, allergies, medications, health status, including review of consultants reports, laboratory and other test data, was performed as part of comprehensive evaluation and provision of chronic care management services.   SDOH:  (Social Determinants of Health) assessments and interventions performed: Yes SDOH Interventions    Flowsheet Row Most Recent Value  SDOH Interventions   Financial Strain Interventions Intervention Not Indicated      SDOH Screenings   Alcohol Screen: Not on file   Depression (PHQ2-9): Low Risk    PHQ-2 Score: 2  Financial Resource Strain: Low Risk    Difficulty of Paying Living Expenses: Not very hard  Food Insecurity: Not on file  Housing: Not on file  Physical Activity: Not on file  Social Connections: Not on file  Stress: Not on file  Tobacco Use: Low Risk    Smoking Tobacco Use: Never   Smokeless Tobacco Use: Never  Transportation Needs: Not on file    CCM Care Plan  Allergies  Allergen Reactions   Ace Inhibitors Swelling    REACTION: tongue swelling   Codeine Nausea Only   Hydrochlorothiazide Other (See Comments)    Dizziness/funny feeling   Insulin Detemir Itching   Lisinopril Swelling    Swelling of tongue   Nsaids Other (See Comments)    Renal  issues   Tylenol [Acetaminophen] Other (See Comments)    Fatty liver    Medications Reviewed Today     Reviewed by [name], [facility] (Pharmacist) on 12/22/20 at [place] List Status: <None>   Medication Order Taking? Sig Documenting Provider Last Dose Status Informant  aspirin EC 81 MG tablet [id number] Yes Take 1 tablet (81 mg total) by mouth daily. Swallow whole. Minna Merritts, MD Taking Active   atorvastatin (LIPITOR) 80 MG tablet [id number]  Take 80 mg by mouth at bedtime.  [provider]  Active Self  Biotin w/ Vitamins C & E (HAIR/SKIN/NAILS PO) [id number]  Take 1 tablet by mouth daily.  [provider]  Active Self           Med Note Burnadette Peter, SHARON G   Wed Sep 27, 2020  8:04 AM)    carvedilol (COREG) 12.5 MG tablet [id number] Yes Take 1 tablet (12.5 mg total) by mouth 2 (two) times daily with a meal. Gollan, Kathlene November, MD Taking Active Self  cetirizine (ZYRTEC) 10 MG tablet [id number]  Take 10 mg by mouth 2 (two) times daily. [provider]  Active Self  clopidogrel (PLAVIX) 75 MG tablet [id number]  Take 1 tablet (75 mg total) by mouth daily. Minna Merritts, MD  Active   clotrimazole-betamethasone Donalynn Furlong) cream [id number]  Apply 1  application topically as needed (groin).  [provider]  Active Self           Med Note Ebony Hail, AMBER B   Wed Jun 10, 2018  5:40 PM)    dapsone 25 MG tablet [id number]  Take 25 mg by mouth daily.  [provider]  Active Self  ferrous sulfate 325 (65 FE) MG tablet [id number]  Take 325 mg by mouth in the morning and at bedtime. [provider]  Active Self  gabapentin (NEURONTIN) 300 MG capsule [id number]  Take 1 capsule (300 mg total) by mouth 3 (three) times daily. Tower, [name], MD  Active   glimepiride (AMARYL) 2 MG tablet [id number] Yes Take 2 mg by mouth daily.  [provider] Taking Active Self  glucose blood test strip [id number]  Use 3 (three) times daily. [provider]  Active Self  hydrALAZINE (APRESOLINE) 50 MG tablet [id number] Yes Take 50 mg by mouth 2 (two) times daily.  [provider] Taking Active Self           Med Note Jeanie Cooks Apr 26, 2020 12:34 PM)    isosorbide mononitrate (IMDUR) 30 MG 24 hr tablet [id number] Yes Take 1 tablet (30 mg total) by mouth in the morning and at bedtime. Minna Merritts, MD Taking Active   letrozole [facility]) 2.5 MG tablet [id number]  Take 2.5 mg by mouth daily. [provider]  Active Self  losartan-hydrochlorothiazide (HYZAAR) 100-12.5 MG tablet [id number] Yes Take 1 tablet by mouth daily. Minna Merritts, MD Taking Active   metFORMIN (GLUCOPHAGE) 1000 MG tablet [id number] Yes Take 1,000 mg by mouth in the morning and at bedtime.  [provider] Taking Active Self  MILK THISTLE PO [id number]  Take 1 capsule by mouth 2 (two) times daily.  [provider]  Active Self           Med Note Burnadette Peter, SHARON G   Wed Sep 27, 2020  8:04 AM)    mirabegron ER ([name]) 50 MG TB24 tablet [id number]  Take 1 tablet (50 mg total) by mouth at  bedtime. Tower, [name], MD  Active   nystatin cream (MYCOSTATIN) [id number]  Apply 1 application topically 2 (two) times daily  as needed (irritation.).  [provider]  Active Self  pantoprazole (PROTONIX) 40 MG tablet [id number]  Take 1 tablet (40 mg total) by mouth daily. Tower, [name], MD  Active   traMADol (ULTRAM) 50 MG tablet [id number]  TAKE 1 TO 2 TABLETS BY MOUTH EVERY 8 HOURS AS NEEDED FOR MODERATE TO SEVERE PAIN Tower, [name], MD  Active   vitamin B-12 (CYANOCOBALAMIN) 1000 MCG tablet [id number]  Take 1,000 mcg by mouth daily.  [provider]  Active Self  Vitamin D, Ergocalciferol, (DRISDOL) 1.25 MG (50000 UT) CAPS capsule [id number]  Take 50,000 Units by mouth every Monday.  [provider]  Active Self            Patient Active Problem List   Diagnosis Date Noted   History of breast cancer 06/18/2020   Anemia 06/16/2020   Elevated TSH 06/16/2020   Occipital neuralgia 02/14/2020   Hair loss 11/09/2018   Leg weakness, bilateral 06/29/2018   Pedal edema 06/22/2018   AICD (automatic cardioverter/defibrillator) present 06/11/2018   GERD (gastroesophageal reflux disease) 06/10/2018   Cholecystitis 06/10/2018   Tingling 10/06/2017   Chronic back pain 14/43/1540   Chronic systolic (congestive) heart failure ([place]) 02/25/2017   Osteopenia 06/16/2016   Routine general medical examination at a health care facility 04/24/2016   Estrogen deficiency 04/24/2016   Urticaria 11/03/2015   Cerumen impaction 03/10/2015   Encounter for Medicare annual wellness exam 06/13/2014   Stroke, small vessel ([place]) 01/18/2014   Family history of hemochromatosis 01/18/2014   Cerebral thrombosis with cerebral infarction ([place]) 01/14/2014   Expressive aphasia 01/13/2014   Nonischemic cardiomyopathy ([place]) 04/05/2013   Shortness of breath 09/03/2012   Elevated transaminase level 05/18/2012   Spinal stenosis of lumbar region 12/10/2011   Mixed incontinence urge and stress 12/10/2011   Renal insufficiency 11/04/2011   Goiter 01/14/2011   Fatty liver 08/28/2010   Type II diabetes mellitus with  renal manifestations ([place]) 07/09/2010   Hyperlipidemia associated with type 2 diabetes mellitus ([place]) 07/09/2010   Hyperkalemia 07/09/2010   REFLEX SYMPATHETIC DYSTROPHY 07/09/2010   Neuropathy 07/09/2010   Essential hypertension 07/09/2010   CARDIOMYOPATHY 07/09/2010   OVERACTIVE BLADDER 07/09/2010    Immunization History  Administered Date(s) Administered   Fluad Quad(high Dose 65+) 02/14/2020   Influenza,inj,Quad PF,6+ Mos 05/24/2015, 04/03/2016, 03/09/2018   Influenza-Unspecified 03/29/2013, 04/11/2014, 04/03/2016, 03/17/2017   PFIZER(Purple Top)SARS-COV-2 Vaccination 07/16/2019, 08/06/2019, 03/03/2020   Pneumococcal Conjugate-13 06/13/2014   Pneumococcal Polysaccharide-23 01/14/2011    Conditions to be addressed/monitored:  Hypertension and Diabetes  Care Plan : [place]  Updates made by [name], [place] since 12/22/2020 12:00 AM     Problem: CHL AMB "PATIENT-SPECIFIC PROBLEM"      Long-Range Goal: Disease Management   Start Date: 12/22/2020  Priority: High  Note:   Current Barriers:  Elevated blood pressure today - came down on recheck  Pharmacist Clinical Goal(s):  Patient will contact provider office for questions/concerns as evidenced notation of same in electronic health record through collaboration with PharmD and provider.   Interventions: 1:1 collaboration with Tower, [name], MD regarding development and update of comprehensive plan of care as evidenced by provider attestation and co-signature Inter-disciplinary care team collaboration (see longitudinal plan of care) Comprehensive medication review performed; medication list updated in electronic medical record  Medication Self-Management -Good self management  -Patient  does well using pillbox. Denies trouble remembering to take medications. She is knowledgeable about the names/indications for her medications. -She does have a lot of medications and has some concerns about the number of  providers and possible drug interactions, errors, etc. -She went to [place] today and was told 3 medications she needed were not able to be refilled until Sept. 2022. She said she would look into it. Let her know she can call me if she is unable to fill these and has run out early. -Discussed pill packs/[facility] coordination. She will let me know if interested. -Continue current medications  Hypertension (BP goal <130/80) -Uncontrolled - per recent home readings  -Meds last adjusted by cardiology April 2022 -Next f/u nephrology August, 26 2022 -We discussed calling nephrology with BP log in the next 2 weeks if they remain above goal. -Current treatment: Carvedilol 12.5 mg - 1 twice daily  Hydralazine 50 mg - 1 twice daily Imdur 30 mg - 1 twice daily Losartan-HCTZ 100-12.5 mg - 1 daily -Medications previously tried: none  -Current home readings: she checks about once/week -She is having a lot of headaches. She affirms she took her morning medications today. 182/86 (July) - this morning 7/22 - rechecked during visit 147/67, 69  157/65 (July)  146/60 (June) 178/79 (June) 162/78 (May) -Current dietary habits: reports she is not drinking enough water, about 1 bottle/day -Denies hypotensive symptoms, but does report headache when BP is elevated. -Educated on BP goals and benefits of medications for prevention of heart attack, stroke and kidney damage; -Counseled to monitor BP at home daily for 1-2 weeks , document, and provide log at future appointments -Recommended to continue current medication; Please increase water. She will check blood pressure every day and call nephrology with an update in the next 1-2 weeks.  Diabetes (A1c goal <7%) -Controlled - A1c 5.1 % -Current medications: Metformin 1000 mg - 1 twice daily Glimepiride 2 mg - 1 daily -Medications previously tried: [place] - cost, Tresiba - not needed -Current home glucose readings - checks daily  fasting glucose:  98 today, 30 day average -136, 90 day - average 131 A few highs fasting due to dietary changes - 156, 172, 244  post prandial glucose: none Denies any readings less than 70 -Denies hypoglycemic/hyperglycemic symptoms -She feels really good, alert, moves along well. -Current meal patterns: she has eaten some chips on occasion with higher readings as a result -Counseled to check feet daily and get yearly eye exams - up to date -Recommended to continue current medication  Patient Goals/Self-Care Activities Patient will:  - contact ccm team with any health concerns -  contact ccm team if desire for medication coordination/delivery  Follow Up Plan: The care management team will reach out to the patient again over the next 90 days.       Medication Assistance: None required.  Patient affirms current coverage meets needs.  Compliance/Adherence/Medication fill history: Care Gaps: Mammogram - completed 11/2020 A1c - completed endo 07/2020 Shingrix vaccine COVID-19 Booster   Star-Rating Drugs: Medication:                            Last Fill:         Day Supply Losartan 100-12.5 mg             09/27/20            90         Metformin 1024m  10/20/20            90 Glimepiride 2 mg                     10/21/20            90         Atorvastatin 76m                   12/04/20              90  Patient's [facility]:  [facility] [place]Phone: [phone]: [phone]Phone: [phone]: [phone] Uses pill box? Yes - 7 day supply, fills pill box once a week Pt endorses 100% compliance [facility] brings the medications out to the car so they can avoid covid-19 exposure.   We discussed: Benefits of medication synchronization, packaging and delivery as well as enhanced pharmacist oversight with  [facility]. Patient decided to: Continue current medication management strategy  Care Plan and Follow Up Patient Decision:  Patient agrees to Care Plan and Follow-up.  MDebbora Dus PharmD Clinical Pharmacist [facility] SpringsPrimary Care at [phone]

## 2020-12-22 NOTE — Patient Instructions (Signed)
Dear Morgan Hubbard,  Below is a summary of the goals we discussed during our follow up appointment on December 22, 2020. Please contact me anytime with questions or concerns.   Visit Information  Patient Care Plan: CCM Pharmacy Care Plan     Problem Identified: [facility] AMB "PATIENT-SPECIFIC PROBLEM"      Long-Range Goal: Disease Management   Start Date: 12/22/2020  Priority: High  Note:   Current Barriers:  Elevated blood pressure today - came down on recheck  Pharmacist Clinical Goal(s):  Patient will contact provider office for questions/concerns as evidenced notation of same in electronic health record through collaboration with PharmD and provider.   Interventions: 1:1 collaboration with Tower, [name], MD regarding development and update of comprehensive plan of care as evidenced by provider attestation and co-signature Inter-disciplinary care team collaboration (see longitudinal plan of care) Comprehensive medication review performed; medication list updated in electronic medical record  Medication Self-Management -Good self management  -Patient does well using pillbox. Denies trouble remembering to take medications. She is knowledgeable about the names/indications for her medications. -She does have a lot of medications and has some concerns about the number of providers and possible drug interactions, errors, etc. -She went to [place] today and was told 3 medications she needed were not able to be refilled until Sept. 2022. She said she would look into it. Let her know she can call me if she is unable to fill these and has run out early. -Discussed pill packs/Upstream coordination. She will let me know if interested. -Continue current medications  Hypertension (BP goal <130/80) -Uncontrolled - per recent home readings  -Meds last adjusted by cardiology April 2022 -Next f/u nephrology August, 26 2022 -We discussed calling nephrology with BP log in the next 2 weeks if  they remain above goal. -Current treatment: Carvedilol 12.5 mg - 1 twice daily  Hydralazine 50 mg - 1 twice daily Imdur 30 mg - 1 twice daily Losartan-HCTZ 100-12.5 mg - 1 daily -Medications previously tried: none  -Current home readings: she checks about once/week -She is having a lot of headaches. She affirms she took her morning medications today. 182/86 (July) - this morning 7/22 - rechecked during visit 147/67, 69  157/65 (July)  146/60 (June) 178/79 (June) 162/78 (May) -Current dietary habits: reports she is not drinking enough water, about 1 bottle/day -Denies hypotensive symptoms, but does report headache when BP is elevated. -Educated on BP goals and benefits of medications for prevention of heart attack, stroke and kidney damage; -Counseled to monitor BP at home daily for 1-2 weeks , document, and provide log at future appointments -Recommended to continue current medication; Please increase water. She will check blood pressure every day and call nephrology with an update in the next 1-2 weeks.  Diabetes (A1c goal <7%) -Controlled - A1c 5.1 % -Current medications: Metformin 1000 mg - 1 twice daily Glimepiride 2 mg - 1 daily -Medications previously tried: [place] - cost, Tresiba - not needed -Current home glucose readings - checks daily  fasting glucose: 98 today, 30 day average -136, 90 day - average 131 A few highs fasting due to dietary changes - 156, 172, 244  post prandial glucose: none Denies any readings less than 70 -Denies hypoglycemic/hyperglycemic symptoms -She feels really good, alert, moves along well. -Current meal patterns: she has eaten some chips on occasion with higher readings as a result -Counseled to check feet daily and get yearly eye exams - up to date -Recommended to continue  current medication  Patient Goals/Self-Care Activities Patient will:  - contact ccm team with any health concerns -  contact ccm team if desire for medication  coordination/delivery  Follow Up Plan: The care management team will reach out to the patient again over the next 90 days.       The patient verbalized understanding of instructions, educational materials, and care plan provided today and declined offer to receive copy of patient instructions, educational materials, and care plan.   [name], PharmD Clinical Pharmacist [facility] at [facility] [phone]

## 2020-12-25 DIAGNOSIS — E1122 Type 2 diabetes mellitus with diabetic chronic kidney disease: Secondary | ICD-10-CM | POA: Diagnosis not present

## 2020-12-25 DIAGNOSIS — N1832 Chronic kidney disease, stage 3b: Secondary | ICD-10-CM | POA: Diagnosis not present

## 2020-12-26 ENCOUNTER — Encounter: Payer: Self-pay | Admitting: Family Medicine

## 2020-12-26 MED ORDER — BENZONATATE 200 MG PO CAPS: 200.0000 mg | ORAL_CAPSULE | Freq: Three times a day (TID) | ORAL | 1 refills | Status: DC | PRN

## 2021-01-04 DIAGNOSIS — H34832 Tributary (branch) retinal vein occlusion, left eye, with macular edema: Secondary | ICD-10-CM | POA: Diagnosis not present

## 2021-01-08 DIAGNOSIS — E1122 Type 2 diabetes mellitus with diabetic chronic kidney disease: Secondary | ICD-10-CM | POA: Diagnosis not present

## 2021-01-08 DIAGNOSIS — I10 Essential (primary) hypertension: Secondary | ICD-10-CM | POA: Diagnosis not present

## 2021-01-08 DIAGNOSIS — E875 Hyperkalemia: Secondary | ICD-10-CM | POA: Diagnosis not present

## 2021-01-08 DIAGNOSIS — E1169 Type 2 diabetes mellitus with other specified complication: Secondary | ICD-10-CM | POA: Diagnosis not present

## 2021-01-08 DIAGNOSIS — E785 Hyperlipidemia, unspecified: Secondary | ICD-10-CM | POA: Diagnosis not present

## 2021-01-08 DIAGNOSIS — N1832 Chronic kidney disease, stage 3b: Secondary | ICD-10-CM | POA: Diagnosis not present

## 2021-01-25 DIAGNOSIS — D649 Anemia, unspecified: Secondary | ICD-10-CM | POA: Diagnosis not present

## 2021-01-25 DIAGNOSIS — E1129 Type 2 diabetes mellitus with other diabetic kidney complication: Secondary | ICD-10-CM | POA: Diagnosis not present

## 2021-01-25 DIAGNOSIS — N3281 Overactive bladder: Secondary | ICD-10-CM | POA: Diagnosis not present

## 2021-01-25 DIAGNOSIS — I129 Hypertensive chronic kidney disease with stage 1 through stage 4 chronic kidney disease, or unspecified chronic kidney disease: Secondary | ICD-10-CM | POA: Diagnosis not present

## 2021-01-25 DIAGNOSIS — E875 Hyperkalemia: Secondary | ICD-10-CM | POA: Diagnosis not present

## 2021-01-25 DIAGNOSIS — R300 Dysuria: Secondary | ICD-10-CM | POA: Diagnosis not present

## 2021-01-25 DIAGNOSIS — N179 Acute kidney failure, unspecified: Secondary | ICD-10-CM | POA: Diagnosis not present

## 2021-01-25 DIAGNOSIS — K219 Gastro-esophageal reflux disease without esophagitis: Secondary | ICD-10-CM | POA: Diagnosis not present

## 2021-01-25 DIAGNOSIS — N183 Chronic kidney disease, stage 3 unspecified: Secondary | ICD-10-CM | POA: Diagnosis not present

## 2021-01-25 DIAGNOSIS — E1122 Type 2 diabetes mellitus with diabetic chronic kidney disease: Secondary | ICD-10-CM | POA: Diagnosis not present

## 2021-02-08 ENCOUNTER — Other Ambulatory Visit: Payer: Self-pay | Admitting: Family Medicine

## 2021-02-08 DIAGNOSIS — N183 Chronic kidney disease, stage 3 unspecified: Secondary | ICD-10-CM | POA: Diagnosis not present

## 2021-02-12 NOTE — Telephone Encounter (Signed)
Name of Medication: tramadol 50  gm Name of Pharmacy: [facility] [address] Last Fill or Written Date and Quantity: # 66 on 11/10/20 Last Office Visit and Type: 06/16/2020  annual exam Next Office Visit and Type:nonc scheduled

## 2021-02-15 DIAGNOSIS — Z4502 Encounter for adjustment and management of automatic implantable cardiac defibrillator: Secondary | ICD-10-CM | POA: Diagnosis not present

## 2021-02-15 DIAGNOSIS — I428 Other cardiomyopathies: Secondary | ICD-10-CM | POA: Diagnosis not present

## 2021-02-15 DIAGNOSIS — Z23 Encounter for immunization: Secondary | ICD-10-CM | POA: Diagnosis not present

## 2021-02-15 DIAGNOSIS — Z95810 Presence of automatic (implantable) cardiac defibrillator: Secondary | ICD-10-CM | POA: Diagnosis not present

## 2021-02-19 ENCOUNTER — Ambulatory Visit (INDEPENDENT_AMBULATORY_CARE_PROVIDER_SITE_OTHER): Payer: Medicare HMO | Admitting: Family Medicine

## 2021-02-19 ENCOUNTER — Encounter: Payer: Self-pay | Admitting: Family Medicine

## 2021-02-19 ENCOUNTER — Ambulatory Visit (INDEPENDENT_AMBULATORY_CARE_PROVIDER_SITE_OTHER)
Admission: RE | Admit: 2021-02-19 | Discharge: 2021-02-19 | Disposition: A | Discharge status: Home / Self Care | Payer: Medicare HMO | Source: Ambulatory Visit | Attending: Family Medicine | Admitting: Family Medicine

## 2021-02-19 ENCOUNTER — Other Ambulatory Visit: Payer: Self-pay

## 2021-02-19 VITALS — BP 150/60 | HR 69 | Temp 98.1°F | Ht 61.0 in | Wt 143.0 lb

## 2021-02-19 DIAGNOSIS — W19XXXA Unspecified fall, initial encounter: Secondary | ICD-10-CM

## 2021-02-19 DIAGNOSIS — Z7902 Long term (current) use of antithrombotics/antiplatelets: Secondary | ICD-10-CM | POA: Diagnosis not present

## 2021-02-19 DIAGNOSIS — M25511 Pain in right shoulder: Secondary | ICD-10-CM

## 2021-02-19 DIAGNOSIS — M898X1 Other specified disorders of bone, shoulder: Secondary | ICD-10-CM

## 2021-02-19 DIAGNOSIS — M19011 Primary osteoarthritis, right shoulder: Secondary | ICD-10-CM | POA: Diagnosis not present

## 2021-02-19 DIAGNOSIS — S5012XA Contusion of left forearm, initial encounter: Secondary | ICD-10-CM

## 2021-02-19 DIAGNOSIS — Z043 Encounter for examination and observation following other accident: Secondary | ICD-10-CM | POA: Diagnosis not present

## 2021-02-19 MED ORDER — AZITHROMYCIN 250 MG PO TABS: ORAL_TABLET | ORAL | 0 refills | Status: AC

## 2021-02-19 NOTE — Progress Notes (Signed)
Morgan Matsuo T. Emile Kyllo, MD, [place] at [facility] [place], [address]  Phone: [phone]  FAX: [phone]  Morgan Hubbard - 86 y.o. female  MRN [id number]  Date of Birth: [birth year]  Date: 02/19/2021  PCP: Abner Greenspan, MD  Referral: Abner Greenspan, MD  Chief Complaint  Patient presents with  . Fall    Sunday  . Shoulder Pain    Right   . Back Pain    This visit occurred during the SARS-CoV-2 public health emergency.  Safety protocols were in place, including screening questions prior to the visit, additional usage of staff PPE, and extensive cleaning of exam room while observing appropriate contact time as indicated for disinfecting solutions.   Subjective:   Morgan Hubbard is a 86 y.o. very pleasant female patient with Body mass index is 27.02 kg/m. who presents with the following:  Fall 02/18/2021:  R shoulder is black and blue after fall.  Some of the details about this are little bit hazy, but she knows that she fell on her shoulder itself, and this is all on the right side.  She also has a significant bruise on her left forearm.  She does not have any history or knowledge of a head injury.  She has some fairly severe pain and bruising about the right shoulder and pain with motion at all.  She is unable to use a walker, and she is unable to use a cane like she normally would at this point.  She also has pain in her back from the neck all the way through the lumbar spine.  She also has some pain in the paraspinous region.  She is on Plavix at baseline.  3 weeks of coughing with some wheezing  Review of Systems is noted in the HPI, as appropriate  Objective:   BP (!) 150/60   Pulse 69   Temp 98.1 F (36.7 C) (Temporal)   Ht 5\' 1"  (1.549 m)   Wt 143 lb (64.9 kg)   LMP 06/03/1992   SpO2 95%   BMI 27.02 kg/m   GEN: No acute distress; alert,appropriate. PULM: Breathing comfortably in no  respiratory distress PSYCH: Normally interactive.   Right shoulder is fairly diffusely tender to palpation at the humeral head as well as at the clavicle and at the [facility] joint.  Extensive movement was not done given acute pain and concern for potential fracture.  She does not have any central spinous process tenderness or pain at the vertebrae throughout the entirety of the neck through the lumbar spine and the sacrum.  She does have some relatively small amount of pain adjacent in the paraspinous musculature.  Full range of motion at both elbows.  Nontender at the radial head and the remainder of the bony anatomy at the elbows, and nontender throughout the radius and ulna, and the entirety of the wrist and hand.  She does have a hematoma with a scrape in the left forearm.     Laboratory and Imaging Data: DG Clavicle Right  Result Date: 02/19/2021 CLINICAL DATA:  Fall, suspect right clavicle fracture EXAM: RIGHT CLAVICLE - 2+ VIEWS COMPARISON:  05/10/2013 right clavicle radiographs FINDINGS: No right clavicle fracture. Nonspecific month eaten appearance to the bones without discrete osseous lesions. Mild right acromioclavicular joint osteoarthritis. Clustered small calcifications between the acromion and right humeral head. Partially visualized left subclavian pacemaker. IMPRESSION: 1. No right clavicle fracture. 2. Nonspecific moth eaten  appearance to the bones, without discrete osseous lesions. 3. Mild right acromioclavicular joint osteoarthritis. 4. Clustered small calcifications between the acromion and right humeral head, suggesting calcific tendinopathy versus bursitis. Electronically Signed   By: [name] M.D.   On: 02/19/2021 12:24   DG Shoulder Right  Result Date: 02/19/2021 CLINICAL DATA:  Fall.  Evaluate for fracture. EXAM: RIGHT SHOULDER - 2+ VIEW COMPARISON:  None. FINDINGS: Osteopenia. No acute fracture or dislocation identified. Degenerative changes are identified involving the  acromioclavicular and glenohumeral joints. IMPRESSION: 1. No acute findings. 2. Osteoarthritis. Electronically Signed   By: Kerby Moors M.D.   On: 02/19/2021 12:15     Assessment and Plan:     ICD-10-CM   1. Acute pain of right shoulder  M25.511 DG Shoulder Right    DG Clavicle Right    2. Pain of right clavicle  M89.8X1 DG Shoulder Right    DG Clavicle Right    3. Fall, initial encounter  W19.XXXA DG Shoulder Right    DG Clavicle Right    4. Traumatic hematoma of left forearm, initial encounter  S50.12XA     5. Platelet inhibition due to Plavix  Z79.02      Total encounter time: 30 minutes. This includes total time spent on the day of encounter.   With examination and appearance, I am obtaining stat x-rays of the affected right shoulder.  I agree with radiology, and I do not appreciate an acute fracture.  She does have both mild acromioclavicular and glenohumeral joint arthritis.  There is calcification inferior to the clavicle and the acromion, and this does likely correspond with prior shoulder trauma and consistent with chronic calcific tendinopathy of the rotator cuff.  I placed the patient in a shoulder immobilizer, and I am going to have her wear this for 2 weeks with scheduled pendulums 3-4 times a day.  Likely bone contusion along with significant soft tissue injury in the setting of using Plavix.  Follow-up with me in 3 weeks.  No orders of the defined types were placed in this encounter.  There are no discontinued medications. Orders Placed This Encounter  Procedures  . DG Shoulder Right  . DG Clavicle Right    Follow-up: No follow-ups on file.  Dr[employer] One speech-to-text software was used for transcription in this dictation.  Possible transcriptional errors can occur using Editor, commissioning.   Signed,  Maud Deed. Camden Knotek, MD   Outpatient Encounter Medications as of 02/19/2021  Medication Sig  . aspirin EC 81 MG tablet Take 1 tablet (81 mg total) by  mouth daily. Swallow whole.  Marland Kitchen atorvastatin (LIPITOR) 80 MG tablet Take 80 mg by mouth at bedtime.   . benzonatate (TESSALON) 200 MG capsule Take 1 capsule (200 mg total) by mouth 3 (three) times daily as needed.  . Biotin w/ Vitamins C & E (HAIR/SKIN/NAILS PO) Take 1 tablet by mouth daily.   . carvedilol (COREG) 12.5 MG tablet Take 1 tablet (12.5 mg total) by mouth 2 (two) times daily with a meal.  . cetirizine (ZYRTEC) 10 MG tablet Take 10 mg by mouth 2 (two) times daily.  . clopidogrel (PLAVIX) 75 MG tablet Take 1 tablet (75 mg total) by mouth daily.  . clotrimazole-betamethasone (LOTRISONE) cream Apply 1 application topically as needed (groin).   . dapsone 25 MG tablet Take 25 mg by mouth daily.   . ferrous sulfate 325 (65 FE) MG tablet Take 325 mg by mouth in the morning and at bedtime.  Marland Kitchen  gabapentin (NEURONTIN) 300 MG capsule Take 1 capsule (300 mg total) by mouth 3 (three) times daily.  Marland Kitchen glimepiride (AMARYL) 2 MG tablet Take 2 mg by mouth daily.   Marland Kitchen glucose blood test strip Use 3 (three) times daily.  . hydrALAZINE (APRESOLINE) 50 MG tablet Take 50 mg by mouth 2 (two) times daily.   . isosorbide mononitrate (IMDUR) 30 MG 24 hr tablet Take 1 tablet (30 mg total) by mouth in the morning and at bedtime.  Marland Kitchen letrozole (FEMARA) 2.5 MG tablet Take 2.5 mg by mouth daily.  Marland Kitchen losartan-hydrochlorothiazide (HYZAAR) 100-12.5 MG tablet Take 1 tablet by mouth daily.  . metFORMIN (GLUCOPHAGE) 1000 MG tablet Take 1,000 mg by mouth in the morning and at bedtime.   Marland Kitchen MILK THISTLE PO Take 1 capsule by mouth 2 (two) times daily.   . mirabegron ER ([name]) 50 MG TB24 tablet Take 1 tablet (50 mg total) by mouth at bedtime.  Marland Kitchen nystatin cream (MYCOSTATIN) Apply 1 application topically 2 (two) times daily as needed (irritation.).   Marland Kitchen pantoprazole (PROTONIX) 40 MG tablet Take 1 tablet (40 mg total) by mouth daily.  . traMADol (ULTRAM) 50 MG tablet TAKE 1 TO 2 TABLETS BY MOUTH EVERY 8 HOURS AS NEEDED FOR  MODERATE TO SEVERE PAIN  . vitamin B-12 (CYANOCOBALAMIN) 1000 MCG tablet Take 1,000 mcg by mouth daily.   . Vitamin D, Ergocalciferol, (DRISDOL) 1.25 MG (50000 UT) CAPS capsule Take 50,000 Units by mouth every Monday.    No facility-administered encounter medications on file as of 02/19/2021.

## 2021-02-23 DIAGNOSIS — E875 Hyperkalemia: Secondary | ICD-10-CM | POA: Diagnosis not present

## 2021-03-12 ENCOUNTER — Ambulatory Visit: Payer: Medicare HMO | Admitting: Family Medicine

## 2021-03-16 ENCOUNTER — Other Ambulatory Visit: Payer: Self-pay | Admitting: Family Medicine

## 2021-03-16 NOTE — Telephone Encounter (Signed)
Name of Medication: tramadol 50  gm Name of Pharmacy: [facility] [address] Last Fill or Written Date and Quantity: # 4 on 11/10/20 Last Office Visit and Type: 06/16/2020  annual exam Next Office Visit and Type:nonc scheduled with PCP but pt has recent and future appts with Dr. Lorelei Pont

## 2021-03-19 ENCOUNTER — Ambulatory Visit (INDEPENDENT_AMBULATORY_CARE_PROVIDER_SITE_OTHER)
Admission: RE | Admit: 2021-03-19 | Discharge: 2021-03-19 | Disposition: A | Discharge status: Home / Self Care | Payer: Medicare HMO | Source: Ambulatory Visit | Attending: Family Medicine | Admitting: Family Medicine

## 2021-03-19 ENCOUNTER — Other Ambulatory Visit: Payer: Self-pay

## 2021-03-19 ENCOUNTER — Encounter: Payer: Self-pay | Admitting: Family Medicine

## 2021-03-19 ENCOUNTER — Ambulatory Visit (INDEPENDENT_AMBULATORY_CARE_PROVIDER_SITE_OTHER): Payer: Medicare HMO | Admitting: Family Medicine

## 2021-03-19 VITALS — BP 140/60 | HR 63 | Temp 98.0°F | Ht 61.0 in | Wt 141.4 lb

## 2021-03-19 DIAGNOSIS — M899 Disorder of bone, unspecified: Secondary | ICD-10-CM

## 2021-03-19 DIAGNOSIS — R936 Abnormal findings on diagnostic imaging of limbs: Secondary | ICD-10-CM | POA: Diagnosis not present

## 2021-03-19 DIAGNOSIS — M25511 Pain in right shoulder: Secondary | ICD-10-CM | POA: Diagnosis not present

## 2021-03-19 DIAGNOSIS — Z853 Personal history of malignant neoplasm of breast: Secondary | ICD-10-CM | POA: Diagnosis not present

## 2021-03-19 DIAGNOSIS — R0781 Pleurodynia: Secondary | ICD-10-CM

## 2021-03-19 DIAGNOSIS — W19XXXA Unspecified fall, initial encounter: Secondary | ICD-10-CM

## 2021-03-19 DIAGNOSIS — M898X1 Other specified disorders of bone, shoulder: Secondary | ICD-10-CM

## 2021-03-19 DIAGNOSIS — M19011 Primary osteoarthritis, right shoulder: Secondary | ICD-10-CM | POA: Diagnosis not present

## 2021-03-19 DIAGNOSIS — I517 Cardiomegaly: Secondary | ICD-10-CM | POA: Diagnosis not present

## 2021-03-19 DIAGNOSIS — Z95810 Presence of automatic (implantable) cardiac defibrillator: Secondary | ICD-10-CM | POA: Diagnosis not present

## 2021-03-19 NOTE — Progress Notes (Signed)
Morgan Gucciardo T. Tonae Livolsi, MD, [place] at [facility] [place], [address]  Phone: [phone]  FAX: [phone]  Morgan Hubbard - 86 y.o. female  MRN [id number]  Date of Birth: 04/14/1935  Date: 03/19/2021  PCP: Abner Greenspan, MD  Referral: Abner Greenspan, MD  Chief Complaint  Patient presents with  . Follow-up    Fall-Shoulder/back -Has had 2 additional falls since last visit-Left Rib pain    This visit occurred during the SARS-CoV-2 public health emergency.  Safety protocols were in place, including screening questions prior to the visit, additional usage of staff PPE, and extensive cleaning of exam room while observing appropriate contact time as indicated for disinfecting solutions.   Subjective:   Morgan Hubbard is a 86 y.o. very pleasant female patient with Body mass index is 26.71 kg/m. who presents with the following:  Has had 3 falls in the last few weeks.   These are 2 additional falls after the initial fall that I saw her for in February 18, 2021.  The bruising around her shoulder has improved, but she still does have pain around the distal clavicle and the [facility] joint.  Right now there is no significant swelling or bruising.  She did wear her sling for about 2 weeks, but she has not needed it since then.  08/2015 dx with breast cancer and treated at [facility].  She also has some pain in her back and her neck.  Additionally since her recent fall she has some pain in the rib region.  This is all on the left side.  Continued should and back pain: R shoulder   This morning, and her feelt went down and skinned her elbow. Last week hit her head.  Neither she nor her husband think she has had any significant neurological changes since then.  No memory changes, strength changes, facial drooping, slurred speech, concentration issues, or any vision difficulties or new or different balance changes  02/19/2021 Last OV with  [name], MD  Fall 02/18/2021:   R shoulder is black and blue after fall.  Some of the details about this are little bit hazy, but she knows that she fell on her shoulder itself, and this is all on the right side.  She also has a significant bruise on her left forearm.  She does not have any history or knowledge of a head injury.  She has some fairly severe pain and bruising about the right shoulder and pain with motion at all.  She is unable to use a walker, and she is unable to use a cane like she normally would at this point.  She also has pain in her back from the neck all the way through the lumbar spine.  She also has some pain in the paraspinous region.     Review of Systems is noted in the HPI, as appropriate   Patient Active Problem List   Diagnosis Date Noted  . History of breast cancer 06/18/2020  . Anemia 06/16/2020  . Elevated TSH 06/16/2020  . Occipital neuralgia 02/14/2020  . Hair loss 11/09/2018  . Leg weakness, bilateral 06/29/2018  . Pedal edema 06/22/2018  . AICD (automatic cardioverter/defibrillator) present 06/11/2018  . GERD (gastroesophageal reflux disease) 06/10/2018  . Cholecystitis 06/10/2018  . Tingling 10/06/2017  . Chronic back pain 05/23/2017  . Chronic systolic (congestive) heart failure ([place]) 02/25/2017  . Osteopenia 06/16/2016  . Routine general medical examination at  a health care facility 04/24/2016  . Estrogen deficiency 04/24/2016  . Urticaria 11/03/2015  . Cerumen impaction 03/10/2015  . Encounter for Medicare annual wellness exam 06/13/2014  . Stroke, small vessel ([place]) 01/18/2014  . Family history of hemochromatosis 01/18/2014  . Cerebral thrombosis with cerebral infarction ([place]) 01/14/2014  . Expressive aphasia 01/13/2014  . Nonischemic cardiomyopathy ([place]) 04/05/2013  . Shortness of breath 09/03/2012  . Elevated transaminase level 05/18/2012  . Spinal stenosis of lumbar region 12/10/2011  . Mixed incontinence urge and stress  12/10/2011  . Renal insufficiency 11/04/2011  . Goiter 01/14/2011  . Fatty liver 08/28/2010  . Type II diabetes mellitus with renal manifestations ([place]) 07/09/2010  . Hyperlipidemia associated with type 2 diabetes mellitus ([place]) 07/09/2010  . Hyperkalemia 07/09/2010  . REFLEX SYMPATHETIC DYSTROPHY 07/09/2010  . Neuropathy 07/09/2010  . Essential hypertension 07/09/2010  . CARDIOMYOPATHY 07/09/2010  . OVERACTIVE BLADDER 07/09/2010    Past Medical History:  Diagnosis Date  . Arthritis    "hands" (01/13/2014)  . Basal cell carcinoma 01/2014   "bridge of nose"  . Cardiac LV ejection fraction >40%    "it was 43 last year" (01/13/2014)  . Chronic lower back pain   . CKD (chronic kidney disease), stage III (HCC)    acute on chronic stage III/notes 06/10/2018  . Colon polyps   . Expressive aphasia    "3 times in the last week" (01/13/2014)  . GERD (gastroesophageal reflux disease)   . Goiter past remote   treated with RI  . Hyperlipidemia   . Hyperpotassemia   . Hypertension   . Hypertonicity of bladder   . Inflammatory and toxic neuropathy, unspecified   . LBBB (left bundle branch block)   . Migraine    "stopped many years ago" (01/13/2014)  . Mini stroke 01/2014  . Other primary cardiomyopathies   . Overactive bladder   . Presence of combination internal cardiac defibrillator (ICD) and pacemaker   . Reflex sympathetic dystrophy, unspecified   . Shingles   . Shortness of breath    ambulation  . Stroke ([place])   . Thyroid disease   . Type II diabetes mellitus ([place])   . Ulcer   . Urine incontinence   . Vertigo    hx of    Past Surgical History:  Procedure Laterality Date  . BACK SURGERY    . BI-VENTRICULAR PACEMAKER INSERTION (CRT-P)  10/2014   [facility]  . BREAST CYST EXCISION Left 1959  . CARDIAC CATHETERIZATION  "several"   [place]  . CATARACT EXTRACTION W/ INTRAOCULAR LENS IMPLANT Left ~ 2005   several eye injections  . CATARACT EXTRACTION W/PHACO Right 05/04/2020    Procedure: CATARACT EXTRACTION PHACO AND INTRAOCULAR LENS PLACEMENT ([place]) RIGHT DIABETIC;  Surgeon: Birder Robson, MD;  Location: [facility];  Service: Ophthalmology;  Laterality: Right;  [place] 00:58.5 CDE 6.70 Fluid Pack [place] # [id number]  . CHOLECYSTECTOMY N/A 06/13/2018   Procedure: LAPAROSCOPIC CHOLECYSTECTOMY;  Surgeon: Donnie Mesa, MD;  Location: [place];  Service: General;  Laterality: N/A;  . COLONOSCOPY  7/12   normal (hx of polyps in past)   . CT SCAN  3/12   outside hosp- lung nodule and gallstones  . ERCP N/A 06/12/2018   Procedure: ENDOSCOPIC RETROGRADE CHOLANGIOPANCREATOGRAPHY (ERCP);  Surgeon: Carol Ada, MD;  Location: [place];  Service: Endoscopy;  Laterality: N/A;  . [place]   "knot on index"  . KIDNEY DONATION Left 1989  . LUMBAR LAMINECTOMY/DECOMPRESSION MICRODISCECTOMY  1999  .  LUMBAR LAMINECTOMY/DECOMPRESSION MICRODISCECTOMY  01/31/2012   Procedure: LUMBAR LAMINECTOMY/DECOMPRESSION MICRODISCECTOMY 2 LEVELS;  Surgeon: Eustace Moore, MD;  Location: [place] [facility]S;  Service: Neurosurgery;  Laterality: N/A;  Thoracic twelve-lumbar one, lumbar one-two laminectomy   . POSTERIOR LAMINECTOMY / DECOMPRESSION CERVICAL SPINE  1999  . REMOVAL OF STONES  06/12/2018   Procedure: REMOVAL OF STONES;  Surgeon: Carol Ada, MD;  Location[facility]COPY;  Service: Endoscopy;;  . Joan Mayans  06/12/2018   Procedure: Joan Mayans;  Surgeon: Carol Ada, MD;  Location: [facility] ENDOSCOPY;  Service: Endoscopy;;  . [place]  . UPPER GASTROINTESTINAL ENDOSCOPY      Family History  Problem Relation Age of Onset  . Arthritis Mother   . Cancer Mother        uterine and mouth  . Hyperlipidemia Mother   . Stroke Mother   . Hypertension Mother   . Alcohol abuse Father   . Diabetes Father   . Cancer Sister        breast  . Diabetes Sister   . Cancer Brother        lung cancer  . Kidney disease Brother   . Diabetes Brother   . Hyperlipidemia  Sister   . Heart disease Sister   . Hypertension Sister   . Diabetes Sister   . Hyperlipidemia Brother   . Kidney failure Brother   . Diabetes Daughter   . Addison's disease Other      Objective:   BP 140/60   Pulse 63   Temp 98 F (36.7 C) (Temporal)   Ht 5\' 1"  (1.549 m)   Wt 141 lb 6 oz (64.1 kg)   LMP 06/03/1992   SpO2 97%   BMI 26.71 kg/m   Right shoulder is no longer significantly bruised.    Abduction and flexion are quite difficult for her.  She is able to actively flex the shoulder to 115.  Flexion is to 115 also.  Strength testing in abduction is approximately 3+/5 Flexion is approximately 4/5 Internal and external rotation is 5/5.  Marland Kitchen  She has no tenderness throughout the entirety of the humerus, and she does have some pain of the distal clavicle and at the [facility] joint.  The scapula is entirely nontender.  No tenderness at the sternum.  Does have some left lowest rib tenderness.  There is no bruising.  The elbow, hand and wrist all move without any difficulty with 5/5 in the right upper extremity.  She does have an approximate 30% loss of motion in all directions of the cervical spine.  She has no spinous process tenderness.  She does have some tenderness in the paraspinous region.  This is on each side.  No bruising in the cervical or thoracic spine region.  In the lower cervical and upper spine thoracic spine there is really only mild tenderness in the musculature surrounding this.  Radiology: DG Ribs Unilateral Left  Result Date: 03/20/2021 CLINICAL DATA:  Fall, left rib pain EXAM: LEFT RIBS - 2 VIEW COMPARISON:  None. FINDINGS: No fracture or other bone lesions are seen involving the ribs. Left subclavian 3 lead pacemaker defibrillator is in expected position. Mild cardiomegaly noted. Left lung is clear. No pneumothorax or pleural effusion on the left. IMPRESSION: No acute rib fracture identified. Electronically Signed   By: Fidela Salisbury M.D.   On: 03/20/2021  18:41   DG Clavicle Right  Result Date: 03/20/2021 CLINICAL DATA:  Fall 1 month prior, persistent right clavicle pain EXAM:  RIGHT CLAVICLE - 2+ VIEWS COMPARISON:  02/19/2021 FINDINGS: Normal alignment. No acute fracture or dislocation. Mild acromioclavicular and glenohumeral degenerative arthritis noted. Calcification involving the rotator cuff insertion at the greater tuberosity may reflect changes of calcific tendinitis or chronic degenerative change. Ossific ovoid density subjacent to the acromion may represent changes of calcific bursitis. There is a progressive permeative process involving the proximal humeral diaphysis with progressive cortical thinning best appreciated along the lateral cortex of the proximal diaphysis suspicious for underlying metastatic disease or myeloma in a patient this age. No superimposed pathologic fracture identified. IMPRESSION: Possible permeative lytic lesion within the proximal humeral diaphysis. Contrast enhanced MRI examination is recommended for further evaluation. No definite associated pathologic fracture. Degenerative changes as outlined above. These results will be called to the ordering clinician or representative by the Radiologist Assistant, and communication documented in the PACS or [employer]. Electronically Signed   By: Fidela Salisbury M.D.   On: 03/20/2021 18:37    DG Ribs Unilateral Left  Result Date: 03/20/2021 CLINICAL DATA:  Fall, left rib pain EXAM: LEFT RIBS - 2 VIEW COMPARISON:  None. FINDINGS: No fracture or other bone lesions are seen involving the ribs. Left subclavian 3 lead pacemaker defibrillator is in expected position. Mild cardiomegaly noted. Left lung is clear. No pneumothorax or pleural effusion on the left. IMPRESSION: No acute rib fracture identified. Electronically Signed   By: Fidela Salisbury M.D.   On: 03/20/2021 18:41   DG Clavicle Right  Result Date: 03/20/2021 CLINICAL DATA:  Fall 1 month prior, persistent right clavicle  pain EXAM: RIGHT CLAVICLE - 2+ VIEWS COMPARISON:  02/19/2021 FINDINGS: Normal alignment. No acute fracture or dislocation. Mild acromioclavicular and glenohumeral degenerative arthritis noted. Calcification involving the rotator cuff insertion at the greater tuberosity may reflect changes of calcific tendinitis or chronic degenerative change. Ossific ovoid density subjacent to the acromion may represent changes of calcific bursitis. There is a progressive permeative process involving the proximal humeral diaphysis with progressive cortical thinning best appreciated along the lateral cortex of the proximal diaphysis suspicious for underlying metastatic disease or myeloma in a patient this age. No superimposed pathologic fracture identified. IMPRESSION: Possible permeative lytic lesion within the proximal humeral diaphysis. Contrast enhanced MRI examination is recommended for further evaluation. No definite associated pathologic fracture. Degenerative changes as outlined above. These results will be called to the ordering clinician or representative by the Radiologist Assistant, and communication documented in the PACS or [employer]. Electronically Signed   By: Fidela Salisbury M.D.   On: 03/20/2021 18:37     Assessment and Plan:     ICD-10-CM   1. Acute pain of right shoulder  M25.511 DG Clavicle Right    Ambulatory referral to Physical Therapy    2. Pain of right clavicle  M89.8X1 DG Clavicle Right    Ambulatory referral to Physical Therapy    3. Fall, initial encounter  [email or web address].Merril Abbe DG Clavicle Right    Ambulatory referral to Physical Therapy    4. Rib pain on left side  R07.81 DG Ribs Unilateral Left    5. History of breast cancer  Z85.3 Ambulatory referral to Hematology / Oncology    6. Lytic bone lesions on xray  M89.9 Ambulatory referral to Hematology / Oncology    7. Abnormal x-ray of shoulder  R93.6 Ambulatory referral to Hematology / Oncology     I repeated the patient's clavicle films  and I do not see any acute fracture.  This certainly could be combination of  bone bruising as well as AC joint sprain.  She certainly could have torn her rotator cuff.  This becomes a challenge given her global state of health, and she does have a pacemaker, congestive heart failure, history of stroke.  I think that we can follow her clinically and progress with some physical therapy and see how she does.  Ultimately, she does very poorly a CT arthrogram could be done, but with surgical risk and potential complication of outcome, I am not sure that this would be the best idea to pursue surgery.  She and her husband are with it enough to ultimately make these decisions.  Addendum: 03/21/21 3:34 PM  This morning the formal read from [facility] radiology came to me, and radiology called as well to let me know that the patient did have new lytic lesions in the humeral head with concerning features for metastatic cancer to bone or myeloma.  While MRI with and without contrast of the shoulder might be a preferred imaging modality, the patient does have a pacemaker defibrillator, this is contraindicated.  Nevertheless, she needs to have oncology help evaluate and manage this, and she sees the [facility] system.  I called [facility] oncology myself and sent a message via their office to Dr. Rip Harbour her oncologist at [facility].  They are kindly going to see the patient shortly as soon as they can based on scheduling.  They will contact her directly.  I have also placed a formal consultation through [place].    Medications Discontinued During This Encounter  Medication Reason  . Biotin w/ Vitamins C & E (HAIR/SKIN/NAILS PO) Completed Course  . dapsone 25 MG tablet Completed Course  . MILK THISTLE PO Completed Course   Orders Placed This Encounter  Procedures  . DG Clavicle Right  . DG Ribs Unilateral Left  . Ambulatory referral to Physical Therapy  . Ambulatory referral to Hematology / Oncology    Follow-up:  Return in about 5 weeks (around 04/23/2021).  Dragon [employer] One speech-to-text software was used for transcription in this dictation.  Possible transcriptional errors can occur using Editor, commissioning.   Signed,  Maud Deed. Hillary Schwegler, MD   Outpatient Encounter Medications as of 03/19/2021  Medication Sig  . aspirin EC 81 MG tablet Take 1 tablet (81 mg total) by mouth daily. Swallow whole.  Marland Kitchen atorvastatin (LIPITOR) 80 MG tablet Take 80 mg by mouth at bedtime.   . benzonatate (TESSALON) 200 MG capsule Take 1 capsule (200 mg total) by mouth 3 (three) times daily as needed.  . carvedilol (COREG) 12.5 MG tablet Take 1 tablet (12.5 mg total) by mouth 2 (two) times daily with a meal.  . cetirizine (ZYRTEC) 10 MG tablet Take 10 mg by mouth daily.  . clopidogrel (PLAVIX) 75 MG tablet Take 1 tablet (75 mg total) by mouth daily.  . clotrimazole-betamethasone (LOTRISONE) cream Apply 1 application topically as needed (groin).   . ferrous sulfate 325 (65 FE) MG tablet Take 325 mg by mouth in the morning and at bedtime.  . gabapentin (NEURONTIN) 300 MG capsule Take 1 capsule (300 mg total) by mouth 3 (three) times daily.  Marland Kitchen glimepiride (AMARYL) 2 MG tablet Take 2 mg by mouth daily.   Marland Kitchen glucose blood test strip Use 3 (three) times daily.  . hydrALAZINE (APRESOLINE) 50 MG tablet Take 50 mg by mouth 2 (two) times daily.   . isosorbide mononitrate (IMDUR) 30 MG 24 hr tablet Take 1 tablet (30 mg total) by mouth in  the morning and at bedtime.  Marland Kitchen letrozole (FEMARA) 2.5 MG tablet Take 2.5 mg by mouth daily.  Marland Kitchen losartan-hydrochlorothiazide (HYZAAR) 100-12.5 MG tablet Take 1 tablet by mouth daily.  . metFORMIN (GLUCOPHAGE) 1000 MG tablet Take 1,000 mg by mouth in the morning and at bedtime.   . mirabegron ER ([name]) 50 MG TB24 tablet Take 1 tablet (50 mg total) by mouth at bedtime.  Marland Kitchen nystatin cream (MYCOSTATIN) Apply 1 application topically 2 (two) times daily as needed (irritation.).   Marland Kitchen pantoprazole  (PROTONIX) 40 MG tablet Take 1 tablet (40 mg total) by mouth daily.  . traMADol (ULTRAM) 50 MG tablet TAKE 1 TO 2 TABLETS BY MOUTH EVERY 8 HOURS AS NEEDED FOR MODERATE TO SEVERE PAIN  . vitamin B-12 (CYANOCOBALAMIN) 1000 MCG tablet Take 1,000 mcg by mouth daily.   . Vitamin D, Ergocalciferol, (DRISDOL) 1.25 MG (50000 UT) CAPS capsule Take 50,000 Units by mouth every Monday.   . [DISCONTINUED] Biotin w/ Vitamins C & E (HAIR/SKIN/NAILS PO) Take 1 tablet by mouth daily.   . [DISCONTINUED] dapsone 25 MG tablet Take 25 mg by mouth daily.   . [DISCONTINUED] MILK THISTLE PO Take 1 capsule by mouth 2 (two) times daily.    No facility-administered encounter medications on file as of 03/19/2021.

## 2021-03-21 ENCOUNTER — Telehealth: Payer: Self-pay | Admitting: *Deleted

## 2021-03-21 ENCOUNTER — Encounter: Payer: Self-pay | Admitting: *Deleted

## 2021-03-21 NOTE — Telephone Encounter (Signed)
I called [facility] earlier, and they are going to call her and help arrange an appointment.  Butch Penny is sending my addended note to [place] now.

## 2021-03-21 NOTE — Telephone Encounter (Signed)
I know.  I saw it this morning.

## 2021-03-21 NOTE — Telephone Encounter (Signed)
Diane with [facility] called wanting to give a call report on the patient. Results are in [place]. Diane wanted to make sure the doctor saw the results of the right clavicle.

## 2021-03-21 NOTE — Telephone Encounter (Signed)
Noted  

## 2021-03-22 ENCOUNTER — Encounter: Payer: Self-pay | Admitting: *Deleted

## 2021-03-26 ENCOUNTER — Other Ambulatory Visit: Payer: Self-pay

## 2021-03-26 ENCOUNTER — Ambulatory Visit: Payer: Medicare HMO | Admitting: Cardiovascular Disease

## 2021-03-26 ENCOUNTER — Encounter: Payer: Self-pay | Admitting: Cardiovascular Disease

## 2021-03-26 VITALS — BP 160/70 | HR 68 | Ht 61.0 in | Wt 142.5 lb

## 2021-03-26 DIAGNOSIS — R0602 Shortness of breath: Secondary | ICD-10-CM

## 2021-03-26 DIAGNOSIS — I6330 Cerebral infarction due to thrombosis of unspecified cerebral artery: Secondary | ICD-10-CM | POA: Diagnosis not present

## 2021-03-26 DIAGNOSIS — E119 Type 2 diabetes mellitus without complications: Secondary | ICD-10-CM

## 2021-03-26 DIAGNOSIS — E7800 Pure hypercholesterolemia, unspecified: Secondary | ICD-10-CM

## 2021-03-26 DIAGNOSIS — I5022 Chronic systolic (congestive) heart failure: Secondary | ICD-10-CM | POA: Diagnosis not present

## 2021-03-26 DIAGNOSIS — I10 Essential (primary) hypertension: Secondary | ICD-10-CM

## 2021-03-26 DIAGNOSIS — I428 Other cardiomyopathies: Secondary | ICD-10-CM | POA: Diagnosis not present

## 2021-03-26 MED ORDER — HYDRALAZINE HCL 50 MG PO TABS: 50.0000 mg | ORAL_TABLET | Freq: Two times a day (BID) | ORAL | 3 refills | Status: DC

## 2021-03-26 MED ORDER — ATORVASTATIN CALCIUM 80 MG PO TABS: 80.0000 mg | ORAL_TABLET | Freq: Every day | ORAL | 3 refills | Status: DC

## 2021-03-26 MED ORDER — LOSARTAN POTASSIUM-HCTZ 100-12.5 MG PO TABS: 1.0000 | ORAL_TABLET | Freq: Every day | ORAL | 3 refills | Status: DC

## 2021-03-26 MED ORDER — CLOPIDOGREL BISULFATE 75 MG PO TABS: 75.0000 mg | ORAL_TABLET | Freq: Every day | ORAL | 3 refills | Status: DC

## 2021-03-26 MED ORDER — CARVEDILOL 12.5 MG PO TABS: 12.5000 mg | ORAL_TABLET | Freq: Two times a day (BID) | ORAL | 3 refills | Status: DC

## 2021-03-26 MED ORDER — ISOSORBIDE MONONITRATE ER 60 MG PO TB24: 60.0000 mg | ORAL_TABLET | Freq: Two times a day (BID) | ORAL | 3 refills | Status: DC

## 2021-03-26 NOTE — Patient Instructions (Addendum)
Medication Instructions:  INCREASED Isosorbide (IMDUR) up to 60 mg twice a day Take an extra hydralazine 50 mg as needed for blood pressure >170  If you need a refill on your cardiac medications before your next appointment, please call your pharmacy.   Lab work: No new labs needed  Testing/Procedures: No new testing needed  Follow-Up: At [facility], you and your health needs are our priority.  As part of our continuing mission to provide you with exceptional heart care, we have created designated Provider Care Teams.  These Care Teams include your primary Cardiologist (physician) and Advanced Practice Providers (APPs -  Physician Assistants and Nurse Practitioners) who all work together to provide you with the care you need, when you need it.  You will need a follow up appointment in 6 months  Providers on your designated Care Team:   Murray Hodgkins, NP Christell Faith, PA-C Cadence Kathlen Mody, [place]  COVID-19 Vaccine Information can be found at: [email or web address].uk For questions related to vaccine distribution or appointments, please email [email or web address] or call [phone].

## 2021-03-26 NOTE — Progress Notes (Signed)
Cardiology Office Note  Date:  03/26/2021   ID:  Tinlee, Navarrette September 14, 1934, MRN [id number]  PCP:  Abner Greenspan, MD   Chief Complaint  Patient presents with   6 month follow up     "Doing well." Medications reviewed by the patient verbally.     HPI:  Ms. Shan is a very pleasant 86 year old woman with past medical history of  nonischemic cardiomyopathy,   Biventricular ICD placed May 2016  Cath 2012 with no CAD ejection fraction of 30% in 2009  left bundle branch block,  TIA/stroke August 2015.  had difficulty speaking,  Walking nephrectomy/donated kidney in 1989,  possible angioedema on lisinopril,  back surgery x3,  minimal carotid b/l in 2015, carotid bruit on the right poorly controlled diabetes   She presents for routine followup  Of her cardiomyopathy  Last seen in clinic by myself April 2022 Followed by EP at outside facility  Ejection fraction of 15-20% in 2015,  Up to 35% in August 2016,  Up to 40% in 2017 Up to 55% in 06/2017 down to 40 to 45% in 06/2018  Falls x 3 Chronic pain Legs getting weaker  Imaging reviewed, x-ray with Possible permeative lytic lesion within the proximal humeral diaphysis. Contrast enhanced MRI examination is recommended for further evaluation PET scan scheduled  Lab work reviewed A1C >8  On aspirin with Plavix for strokes, TIAs  EKG personally reviewed by myself on todays visit Shows paced rhythm rate 70 bpm  MRI results reviewed and discussed in detail MRI brain/MRA head w/o contrast 03/08/19: 1. No acute intracranial process. Left frontal lobe encephalomalacia, likely due to prior ischemia.  2. Unchanged high grade narrowing of the left MCA distal M1 segment.  Lab Results  Component Value Date   CHOL 99 06/12/2020   HDL 33.20 (L) 06/12/2020   LDLCALC 46 06/10/2019   TRIG 209.0 (H) 06/12/2020     Other past medical history reviewed TIA/stroke August 2015.  had difficulty speaking,  Walking  Initially placed on  aspirin, Plavix, been changed by neurology to aspirin 325 mg daily , Plavix held  She continues to follow-up at [facility]   with cardiology and EP  Biventricular ICD placed May 2016 for shortness of breath symptoms placed by  Dr. Beather Arbour    other past medical history   History ofsevere back disease she has significant neuropathy in her legs.    Echocardiogram was done for her fatigue and shortness of breath.  ejection fraction of 25-30% which was significant drop from prior ejection fraction February 2013 (45%) done at [facility].  normal RVSP.   Prior cardiac catheterization 08/30/2010 in [place]  showed no significant coronary artery disease  PMH:   has a past medical history of Arthritis, Basal cell carcinoma (01/2014), Cardiac LV ejection fraction >40%, Chronic lower back pain, CKD (chronic kidney disease), stage III ([place]), Colon polyps, Expressive aphasia, GERD (gastroesophageal reflux disease), Goiter (past remote), Hyperlipidemia, Hyperpotassemia, Hypertension, Hypertonicity of bladder, Inflammatory and toxic neuropathy, unspecified, LBBB (left bundle branch block), Migraine, Mini stroke (01/2014), Other primary cardiomyopathies, Overactive bladder, Presence of combination internal cardiac defibrillator (ICD) and pacemaker, Reflex sympathetic dystrophy, unspecified, Shingles, Shortness of breath, Stroke ([place]), Thyroid disease, Type II diabetes mellitus ([place]), Ulcer, Urine incontinence, and Vertigo.  PSH:    Past Surgical History:  Procedure Laterality Date   BACK SURGERY     BI-VENTRICULAR PACEMAKER INSERTION (CRT-P)  10/2014   [facility]   BREAST CYST EXCISION Left 1959  CARDIAC CATHETERIZATION  "several"   [place]   CATARACT EXTRACTION W/ INTRAOCULAR LENS IMPLANT Left ~ 2005   several eye injections   CATARACT EXTRACTION W/PHACO Right 05/04/2020   Procedure: CATARACT EXTRACTION PHACO AND INTRAOCULAR LENS PLACEMENT (IOC) RIGHT DIABETIC;  Surgeon: Birder Robson, MD;  Location: [facility]  ORS;  Service: Ophthalmology;  Laterality: Right;  [place] 00:58.5 CDE 6.70 Fluid Pack [place] # [id number]   CHOLECYSTECTOMY N/A 06/13/2018   Procedure: LAPAROSCOPIC CHOLECYSTECTOMY;  Surgeon: Donnie Mesa, MD;  Location: [place];  Service: General;  Laterality: N/A;   COLONOSCOPY  7/12   normal (hx of polyps in past)    CT SCAN  3/12   outside hosp- lung nodule and gallstones   ERCP N/A 06/12/2018   Procedure: ENDOSCOPIC RETROGRADE CHOLANGIOPANCREATOGRAPHY (ERCP);  Surgeon: Carol Ada, MD;  Location: [place];  Service: Endoscopy;  Laterality: N/A;   [place]   "knot on index"   KIDNEY DONATION Left 1989   LUMBAR LAMINECTOMY/DECOMPRESSION MICRODISCECTOMY  1999   LUMBAR LAMINECTOMY/DECOMPRESSION MICRODISCECTOMY  01/31/2012   Procedure: LUMBAR LAMINECTOMY/DECOMPRESSION MICRODISCECTOMY 2 LEVELS;  Surgeon: Eustace Moore, MD;  Location: [place] NEURO ORS;  Service: Neurosurgery;  Laterality: N/A;  Thoracic twelve-lumbar one, lumbar one-two laminectomy    POSTERIOR LAMINECTOMY / [place]   REMOVAL OF STONES  06/12/2018   Procedure: REMOVAL OF STONES;  Surgeon: Carol Ada, MD;  Location: [place];  Service: Endoscopy;;   SPHINCTEROTOMY  06/12/2018   Procedure: Joan Mayans;  Surgeon: Carol Ada, MD;  Location: [place];  Service: Endoscopy;;   TUBAL LIGATION  1976   UPPER GASTROINTESTINAL ENDOSCOPY      Current Outpatient Medications  Medication Sig Dispense Refill   aspirin EC 81 MG tablet Take 1 tablet (81 mg total) by mouth daily. Swallow whole. 90 tablet 3   atorvastatin (LIPITOR) 80 MG tablet Take 80 mg by mouth at bedtime.      benzonatate (TESSALON) 200 MG capsule Take 1 capsule (200 mg total) by mouth 3 (three) times daily as needed. 30 capsule 1   carvedilol (COREG) 12.5 MG tablet Take 1 tablet (12.5 mg total) by mouth 2 (two) times daily with a meal. 180 tablet 4   cetirizine (ZYRTEC) 10 MG tablet Take 10 mg by mouth daily.      clopidogrel (PLAVIX) 75 MG tablet Take 1 tablet (75 mg total) by mouth daily. 90 tablet 3   clotrimazole-betamethasone (LOTRISONE) cream Apply 1 application topically as needed (groin).      ferrous sulfate 325 (65 FE) MG tablet Take 325 mg by mouth in the morning and at bedtime.     gabapentin (NEURONTIN) 300 MG capsule Take 1 capsule (300 mg total) by mouth 3 (three) times daily. 270 capsule 3   glimepiride (AMARYL) 2 MG tablet Take 2 mg by mouth daily.      glucose blood test strip Use 3 (three) times daily.     hydrALAZINE (APRESOLINE) 50 MG tablet Take 50 mg by mouth 2 (two) times daily.      isosorbide mononitrate (IMDUR) 30 MG 24 hr tablet Take 1 tablet (30 mg total) by mouth in the morning and at bedtime. 180 tablet 3   letrozole (FEMARA) 2.5 MG tablet Take 2.5 mg by mouth daily.     losartan-hydrochlorothiazide (HYZAAR) 100-12.5 MG tablet Take 1 tablet by mouth daily. 90 tablet 3   metFORMIN (GLUCOPHAGE) 1000 MG tablet Take 1,000 mg by mouth in the morning and at bedtime.  mirabegron ER ([name]) 50 MG TB24 tablet Take 1 tablet (50 mg total) by mouth at bedtime. 90 tablet 3   nystatin cream (MYCOSTATIN) Apply 1 application topically 2 (two) times daily as needed (irritation.).      pantoprazole (PROTONIX) 40 MG tablet Take 1 tablet (40 mg total) by mouth daily. 90 tablet 3   traMADol (ULTRAM) 50 MG tablet TAKE 1 TO 2 TABLETS BY MOUTH EVERY 8 HOURS AS NEEDED FOR MODERATE TO SEVERE PAIN 60 tablet 0   vitamin B-12 (CYANOCOBALAMIN) 1000 MCG tablet Take 1,000 mcg by mouth daily.      Vitamin D, Ergocalciferol, (DRISDOL) 1.25 MG (50000 UT) CAPS capsule Take 50,000 Units by mouth every Monday.      No current facility-administered medications for this visit.     Allergies:   Ace inhibitors, Codeine, Hydrochlorothiazide, Insulin detemir, Lisinopril, Nsaids, and Tylenol [acetaminophen]   Social History:  The patient  reports that she has never smoked. She has never used smokeless  tobacco. She reports that she does not drink alcohol and does not use drugs.   Family History:   family history includes Addison's disease in an other family member; Alcohol abuse in her father; Arthritis in her mother; Cancer in her brother, mother, and sister; Diabetes in her brother, daughter, father, sister, and sister; Heart disease in her sister; Hyperlipidemia in her brother, mother, and sister; Hypertension in her mother and sister; Kidney disease in her brother; Kidney failure in her brother; Stroke in her mother.    Review of Systems: Review of Systems  HENT: Negative.    Respiratory: Negative.    Cardiovascular: Negative.   Gastrointestinal: Negative.   Musculoskeletal: Negative.        Gait instability  Neurological:  Positive for weakness.  Psychiatric/Behavioral: Negative.    All other systems reviewed and are negative.  PHYSICAL EXAM: VS:  BP (!) 160/70 (BP Location: Left Arm, Patient Position: Sitting, Cuff Size: Normal)   Pulse 68   Ht 5\' 1"  (1.549 m)   Wt 142 lb 8 oz (64.6 kg)   LMP 06/03/1992   SpO2 98%   BMI 26.93 kg/m  , BMI Body mass index is 26.93 kg/m. Constitutional:  oriented to person, place, and time. No distress.  HENT:  Head: Grossly normal Eyes:  no discharge. No scleral icterus.  Neck: No JVD, no carotid bruits  Cardiovascular: Regular rate and rhythm, no murmurs appreciated Pulmonary/Chest: Clear to auscultation bilaterally, no wheezes or rails Abdominal: Soft.  no distension.  no tenderness.  Musculoskeletal: Normal range of motion Neurological:  normal muscle tone. Coordination normal. No atrophy Skin: Skin warm and dry Psychiatric: normal affect, pleasant  Recent Labs: 06/12/2020: ALT 14; BUN 22; Creatinine, Ser 1.35; Hemoglobin 11.5; Platelets 229.0; Potassium 5.3 No hemolysis seen; Sodium 141 06/16/2020: TSH 2.62    Lipid Panel Lab Results  Component Value Date   CHOL 99 06/12/2020   HDL 33.20 (L) 06/12/2020   LDLCALC 46 06/10/2019    TRIG 209.0 (H) 06/12/2020      Wt Readings from Last 3 Encounters:  03/26/21 142 lb 8 oz (64.6 kg)  03/19/21 141 lb 6 oz (64.1 kg)  02/19/21 143 lb (64.9 kg)     ASSESSMENT AND PLAN:  HYPERCHOLESTEROLEMIA Cholesterol is at goal on the current lipid regimen. No changes to the medications were made.  Essential hypertension Blood pressure continues to run high Recommend she go up on the Imdur 60 twice daily Extra hydralazine 50 as needed for systolic pressure  over 170 Continue on the losartan HCTZ 100/12.5 daily Continue Coreg Call us with blood pressure measurements  Nonischemic cardiomyopathy ([place]) echocardiogram from 2019  normal ejection fraction Ejection fraction 40 to 45% January 2020 Stressed importance of getting blood pressure under control Changes to medications as above  Cerebral thrombosis with cerebral infarction ([place]) Continue aspirin Plavix Cholesterol at goal Working on blood pressure as above  Type 2 diabetes mellitus without complication, unspecified long term insulin use status (HCC) A1c getting out of control now 8.6, unclear if she is taking her medications appropriately Follow-up with endocrine  Falls  deconditioning, unable to exercise Long discussion concerning her falls, need to exercise, participate in PT   Total encounter time more than 35 minutes  Greater than 50% was spent in counseling and coordination of care with the patient    Orders Placed This Encounter  Procedures   EKG 12-Lead    Total encounter time more than 35 minutes  Greater than 50% was spent in counseling and coordination of care with the patient   Signed, Esmond Plants, M.D., Ph.D. 03/26/2021  [place], [place] [phone]

## 2021-04-09 DIAGNOSIS — M810 Age-related osteoporosis without current pathological fracture: Secondary | ICD-10-CM | POA: Diagnosis not present

## 2021-04-09 DIAGNOSIS — I10 Essential (primary) hypertension: Secondary | ICD-10-CM | POA: Diagnosis not present

## 2021-04-09 DIAGNOSIS — Z170 Estrogen receptor positive status [ER+]: Secondary | ICD-10-CM | POA: Diagnosis not present

## 2021-04-09 DIAGNOSIS — C50911 Malignant neoplasm of unspecified site of right female breast: Secondary | ICD-10-CM | POA: Diagnosis not present

## 2021-04-09 DIAGNOSIS — C50311 Malignant neoplasm of lower-inner quadrant of right female breast: Secondary | ICD-10-CM | POA: Diagnosis not present

## 2021-04-09 DIAGNOSIS — M899 Disorder of bone, unspecified: Secondary | ICD-10-CM | POA: Diagnosis not present

## 2021-04-17 DIAGNOSIS — N1832 Chronic kidney disease, stage 3b: Secondary | ICD-10-CM | POA: Diagnosis not present

## 2021-04-17 DIAGNOSIS — E1122 Type 2 diabetes mellitus with diabetic chronic kidney disease: Secondary | ICD-10-CM | POA: Diagnosis not present

## 2021-04-17 DIAGNOSIS — E875 Hyperkalemia: Secondary | ICD-10-CM | POA: Diagnosis not present

## 2021-04-24 ENCOUNTER — Ambulatory Visit: Payer: Medicare HMO | Admitting: Family Medicine

## 2021-04-24 DIAGNOSIS — E785 Hyperlipidemia, unspecified: Secondary | ICD-10-CM | POA: Diagnosis not present

## 2021-04-24 DIAGNOSIS — N1832 Chronic kidney disease, stage 3b: Secondary | ICD-10-CM | POA: Diagnosis not present

## 2021-04-24 DIAGNOSIS — E1169 Type 2 diabetes mellitus with other specified complication: Secondary | ICD-10-CM | POA: Diagnosis not present

## 2021-04-24 DIAGNOSIS — E1122 Type 2 diabetes mellitus with diabetic chronic kidney disease: Secondary | ICD-10-CM | POA: Diagnosis not present

## 2021-04-25 ENCOUNTER — Ambulatory Visit (INDEPENDENT_AMBULATORY_CARE_PROVIDER_SITE_OTHER): Payer: Medicare HMO | Admitting: Family Medicine

## 2021-04-25 ENCOUNTER — Other Ambulatory Visit: Payer: Self-pay

## 2021-04-25 DIAGNOSIS — G8929 Other chronic pain: Secondary | ICD-10-CM

## 2021-04-25 DIAGNOSIS — M25519 Pain in unspecified shoulder: Secondary | ICD-10-CM | POA: Insufficient documentation

## 2021-04-25 NOTE — Progress Notes (Signed)
   Subjective:    Patient ID: Morgan Hubbard, female    DOB: 10/09/1934, 86 y.o.   MRN: [id number]  This visit occurred during the SARS-CoV-2 public health emergency.  Safety protocols were in place, including screening questions prior to the visit, additional usage of staff PPE, and extensive cleaning of exam room while observing appropriate contact time as indicated for disinfecting solutions.   HPI Error-no visit was needed     Review of Systems     Objective:   Physical Exam        Assessment & Plan:

## 2021-05-14 ENCOUNTER — Other Ambulatory Visit: Payer: Self-pay | Admitting: Family Medicine

## 2021-05-15 NOTE — Telephone Encounter (Signed)
Name of Medication: tramadol 50  gm Name of Pharmacy: [facility] [address] Last Fill or Written Date and Quantity: # 96 on 03/16/21 Last Office Visit and Type: 06/16/2020  annual exam Next Office Visit and Type:nonc scheduled with PCP but pt has recent and future appts with Dr. Lorelei Pont

## 2021-05-21 ENCOUNTER — Encounter: Payer: Self-pay | Admitting: Family Medicine

## 2021-05-22 ENCOUNTER — Telehealth: Payer: Self-pay

## 2021-05-22 MED ORDER — MOLNUPIRAVIR EUA 200MG CAPSULE: 4.0000 | ORAL_CAPSULE | Freq: Two times a day (BID) | ORAL | 0 refills | Status: AC

## 2021-05-22 NOTE — Telephone Encounter (Signed)
I spoke with pt; I offered pt virtual appt today at 3pm with Dr Diona Browner but pt said I need to talk with Dr [name] because she knows the pt. Pt did not want virtual visit. Pt will be coming with her husband now to [facility] because her husband is getting tested for ?covid and flu. pt said she started this morning with dry cough,body aches, and runny nose. Pt has loose stools yesterday but none today. No more SOB than usual. No other symptoms. Pt said she has lots of health issues  and is coming with her husband that had appt with Dr [name] today and would like to be tested and given med for sickness or as preventative med. Pt will be in car with her husband for testing. Sending note to Dr [name], [facility] CMA and will teams [place] also.

## 2021-05-22 NOTE — Telephone Encounter (Signed)
Pt's wife has called stating she is starting to feel bad as well and was wondering she she can get a test when her husband does or get an order of TAMIFLU sent to the pharmacy ? Please advise

## 2021-05-22 NOTE — Telephone Encounter (Signed)
Pt notified of Dr. Marliss Coots comments and recommendations and that Rx was sent to pharmacy. Er precautions given

## 2021-05-22 NOTE — Telephone Encounter (Signed)
Her husband tested pos for covid so she very likely has it  I sent molnupiravir to her pharmacy   Can cause some loose stool If not tolerated let us know Treat symptoms   ER precautions fore severe symptoms /sob Keep [place] posted

## 2021-05-22 NOTE — Telephone Encounter (Addendum)
Per Dr. [name] pt doesn't need to be tested, we are testing pt's spouse and whatever he is positive for she will treat both pt's for since she would have been exposed to it. Called pt but it was to late they were already on their way here, will tell pt this info when they get here at the 2:30 testing appt for spouse   Will route to PCP

## 2021-05-22 NOTE — Addendum Note (Signed)
Addended by: [name] A on: 05/22/2021 03:04 PM   Modules accepted: Orders

## 2021-05-22 NOTE — Telephone Encounter (Signed)
See message on mychart. PCP is okay treating pt for whatever spouse test positive for. No testing needed for pt.

## 2021-05-23 ENCOUNTER — Encounter: Payer: Self-pay | Admitting: Family Medicine

## 2021-05-29 NOTE — Progress Notes (Signed)
Subjective:   Morgan Hubbard is a 86 y.o. female who presents for Medicare Annual (Subsequent) preventive examination.  I connected with Levia Waltermire today by telephone and verified that I am speaking with the correct person using two identifiers. Location patient: home Location provider: work Persons participating in the virtual visit: patient, Marine scientist.    I discussed the limitations, risks, security and privacy concerns of performing an evaluation and management service by telephone and the availability of in person appointments. I also discussed with the patient that there may be a patient responsible charge related to this service. The patient expressed understanding and verbally consented to this telephonic visit.    Interactive audio and video telecommunications were attempted between this provider and patient, however failed, due to patient having technical difficulties OR patient did not have access to video capability.  We continued and completed visit with audio only.  Some vital signs may be absent or patient reported.   Time Spent with patient on telephone encounter: 20 minutes  Review of Systems     Cardiac Risk Factors include: advanced age (>71men, >74 women);diabetes mellitus;hypertension;dyslipidemia     Objective:    Today's Vitals   05/30/21 1313  Weight: 142 lb (64.4 kg)  Height: 5\' 1"  (1.549 m)   Body mass index is 26.83 kg/m.  Advanced Directives 05/30/2021 06/10/2019 06/11/2018 12/30/2016 04/22/2016 01/02/2016 01/13/2014  Does Patient Have a Medical Advance Directive? Yes Yes Yes Yes Yes Yes Yes  Type of Paramedic of [place];Living will [place];Living will [place];Living will [place];Living will [place];Living will [place];Living will [place];Living will  Does patient want to make changes to medical advance directive?  Yes (MAU/Ambulatory/Procedural Areas - Information given) - No - Patient declined No - Patient declined No - Patient declined No - Patient declined -  Copy of [place] in Chart? - No - copy requested No - copy requested No - copy requested Yes No - copy requested No - copy requested  Pre-existing out of facility DNR order (yellow form or pink MOST form) - - - - - - -    Current Medications (verified) Outpatient Encounter Medications as of 05/30/2021  Medication Sig   aspirin EC 81 MG tablet Take 1 tablet (81 mg total) by mouth daily. Swallow whole.   atorvastatin (LIPITOR) 80 MG tablet Take 1 tablet (80 mg total) by mouth at bedtime.   carvedilol (COREG) 12.5 MG tablet Take 1 tablet (12.5 mg total) by mouth 2 (two) times daily with a meal.   cetirizine (ZYRTEC) 10 MG tablet Take 10 mg by mouth daily.   clopidogrel (PLAVIX) 75 MG tablet Take 1 tablet (75 mg total) by mouth daily.   clotrimazole-betamethasone (LOTRISONE) cream Apply 1 application topically as needed (groin).    ferrous sulfate 325 (65 FE) MG tablet Take 325 mg by mouth in the morning and at bedtime.   gabapentin (NEURONTIN) 300 MG capsule Take 1 capsule (300 mg total) by mouth 3 (three) times daily.   glimepiride (AMARYL) 2 MG tablet Take 2 mg by mouth daily.    glucose blood test strip Use 3 (three) times daily.   hydrALAZINE (APRESOLINE) 50 MG tablet Take 1 tablet (50 mg total) by mouth 2 (two) times daily. Take an extra hydralazine 50 mg as needed for pressure >170   isosorbide mononitrate (IMDUR) 60 MG 24 hr tablet Take 1 tablet (  60 mg total) by mouth in the morning and at bedtime.   letrozole (FEMARA) 2.5 MG tablet Take 2.5 mg by mouth daily.   losartan-hydrochlorothiazide (HYZAAR) 100-12.5 MG tablet Take 1 tablet by mouth daily.   metFORMIN (GLUCOPHAGE) 1000 MG tablet Take 1,000 mg by mouth in the morning and at bedtime.    mirabegron ER ([name]) 50 MG TB24 tablet Take 1 tablet (50 mg total) by mouth  at bedtime.   nystatin cream (MYCOSTATIN) Apply 1 application topically 2 (two) times daily as needed (irritation.).    pantoprazole (PROTONIX) 40 MG tablet Take 1 tablet (40 mg total) by mouth daily.   traMADol (ULTRAM) 50 MG tablet TAKE 1 TO 2 TABLETS BY MOUTH EVERY 8 HOURS AS NEEDED FOR MODERATE TO SEVERE PAIN   vitamin B-12 (CYANOCOBALAMIN) 1000 MCG tablet Take 1,000 mcg by mouth daily.    Vitamin D, Ergocalciferol, (DRISDOL) 1.25 MG (50000 UT) CAPS capsule Take 50,000 Units by mouth every Monday.    No facility-administered encounter medications on file as of 05/30/2021.    Allergies (verified) Ace inhibitors, Codeine, Hydrochlorothiazide, Insulin detemir, Lisinopril, Nsaids, and Tylenol [acetaminophen]   History: Past Medical History:  Diagnosis Date   Arthritis    "hands" (01/13/2014)   Basal cell carcinoma 01/2014   "bridge of nose"   Cardiac LV ejection fraction >40%    "it was 43 last year" (01/13/2014)   Chronic lower back pain    CKD (chronic kidney disease), stage III ([place])    acute on chronic stage III/notes 06/10/2018   Colon polyps    Expressive aphasia    "3 times in the last week" (01/13/2014)   GERD (gastroesophageal reflux disease)    Goiter past remote   treated with RI   Hyperlipidemia    Hyperpotassemia    Hypertension    Hypertonicity of bladder    Inflammatory and toxic neuropathy, unspecified    LBBB (left bundle branch block)    Migraine    "stopped many years ago" (01/13/2014)   Mini stroke 01/2014   Other primary cardiomyopathies    Overactive bladder    Presence of combination internal cardiac defibrillator (ICD) and pacemaker    Reflex sympathetic dystrophy, unspecified    Shingles    Shortness of breath    ambulation   Stroke ([place])    Thyroid disease    Type II diabetes mellitus ([place])    Ulcer    Urine incontinence    Vertigo    hx of   Past Surgical History:  Procedure Laterality Date   BACK SURGERY     BI-VENTRICULAR PACEMAKER  INSERTION (CRT-P)  10/2014   [facility]   BREAST CYST EXCISION Left 1959   CARDIAC CATHETERIZATION  "several"   [place]   CATARACT EXTRACTION W/ INTRAOCULAR LENS IMPLANT Left ~ 2005   several eye injections   CATARACT EXTRACTION W/PHACO Right 05/04/2020   Procedure: CATARACT EXTRACTION PHACO AND INTRAOCULAR LENS PLACEMENT ([place]) RIGHT DIABETIC;  Surgeon: Birder Robson, MD;  Location: [facility];  Service: Ophthalmology;  Laterality: Right;  [place] 00:58.5 CDE 6.70 Fluid Pack [place] # [id number]   CHOLECYSTECTOMY N/A 06/13/2018   Procedure: LAPAROSCOPIC CHOLECYSTECTOMY;  Surgeon: Donnie Mesa, MD;  Location: [place];  Service: General;  Laterality: N/A;   COLONOSCOPY  7/12   normal (hx of polyps in past)    CT SCAN  3/12   outside hosp- lung nodule and gallstones   ERCP N/A 06/12/2018   Procedure: ENDOSCOPIC RETROGRADE CHOLANGIOPANCREATOGRAPHY (ERCP);  Surgeon: Benson Norway,  Saralyn Pilar, MD;  Location: [facility];  Service: Endoscopy;  Laterality: N/A;   [place]   "knot on index"   KIDNEY DONATION Left 1989   LUMBAR LAMINECTOMY/DECOMPRESSION MICRODISCECTOMY  1999   LUMBAR LAMINECTOMY/DECOMPRESSION MICRODISCECTOMY  01/31/2012   Procedure: LUMBAR LAMINECTOMY/DECOMPRESSION MICRODISCECTOMY 2 LEVELS;  Surgeon: Eustace Moore, MD;  Location: [facility];  Service: Neurosurgery;  Laterality: N/A;  Thoracic twelve-lumbar one, lumbar one-two laminectomy    POSTERIOR LAMINECTOMY / [place]   REMOVAL OF STONES  06/12/2018   Procedure: REMOVAL OF STONES;  Surgeon: Carol Ada, MD;  Location: [facility];  Service: Endoscopy;;   SPHINCTEROTOMY  06/12/2018   Procedure: Joan Mayans;  Surgeon: Carol Ada, MD;  Location: [facility];  Service: Endoscopy;;   TUBAL LIGATION  1976   UPPER GASTROINTESTINAL ENDOSCOPY     Family History  Problem Relation Age of Onset   Arthritis Mother    Cancer Mother        uterine and mouth   Hyperlipidemia Mother    Stroke Mother     Hypertension Mother    Alcohol abuse Father    Diabetes Father    Cancer Sister        breast   Diabetes Sister    Cancer Brother        lung cancer   Kidney disease Brother    Diabetes Brother    Hyperlipidemia Sister    Heart disease Sister    Hypertension Sister    Diabetes Sister    Hyperlipidemia Brother    Kidney failure Brother    Diabetes Daughter    Addison's disease Other    Social History   Socioeconomic History   Marital status: Married    Spouse name: Not on file   Number of children: 2   Years of education: Not on file   Highest education level: Not on file  Occupational History   Occupation: retired - Production manager   Tobacco Use   Smoking status: Never   Smokeless tobacco: Never  Vaping Use   Vaping Use: Never used  Substance and Sexual Activity   Alcohol use: No    Alcohol/week: 0.0 standard drinks   Drug use: No   Sexual activity: Yes  Other Topics Concern   Not on file  Social History Narrative   Married 2nd time   Daughter is Surveyor, minerals    Social Determinants of Health   Financial Resource Strain: Low Risk    Difficulty of Paying Living Expenses: Not hard at all  Food Insecurity: No Food Insecurity   Worried About Charity fundraiser in the Last Year: Never true   Arboriculturist in the Last Year: Never true  Transportation Needs: No Transportation Needs   Lack of Transportation (Medical): No   Lack of Transportation (Non-Medical): No  Physical Activity: Inactive   Days of Exercise per Week: 0 days   Minutes of Exercise per Session: 0 min  Stress: No Stress Concern Present   Feeling of Stress : Not at all  Social Connections: Moderately Isolated   Frequency of Communication with Friends and Family: More than three times a week   Frequency of Social Gatherings with Friends and Family: Three times a week   Attends Religious Services: Never   Active Member of Clubs or Organizations: No   Attends Archivist Meetings:  Never   Marital Status: Married    Tobacco Counseling Counseling given: Not  Answered   Clinical Intake:  Pre-visit preparation completed: Yes  Pain : No/denies pain     BMI - recorded: 26.83 Nutritional Status: BMI 25 -29 Overweight Nutritional Risks: None Diabetes: Yes CBG done?: No Did pt. bring in CBG monitor from home?: No  How often do you need to have someone help you when you read instructions, pamphlets, or other written materials from your doctor or pharmacy?: 1 - Never  Diabetes:  Is the patient diabetic?  Yes  If diabetic, was a CBG obtained today?  No  Did the patient bring in their glucometer from home?  No  How often do you monitor your CBG's? daily.   Financial Strains and Diabetes Management:  Are you having any financial strains with the device, your supplies or your medication? No .  Does the patient want to be seen by Chronic Care Management for management of their diabetes?  No  Would the patient like to be referred to a Nutritionist or for Diabetic Management?  No   Diabetic Exams:  Diabetic Eye Exam: Completed 03/2021, advised patient to have update report sent to PCP  Diabetic Foot Exam:  Completed 06/16/20   Interpreter Needed?: No  Information entered by :: Orrin Brigham LPN   Activities of Daily Living In your present state of health, do you have any difficulty performing the following activities: 05/30/2021  Hearing? N  Vision? N  Difficulty concentrating or making decisions? N  Walking or climbing stairs? N  Dressing or bathing? N  Doing errands, shopping? N  Preparing Food and eating ? N  Using the Toilet? N  In the past six months, have you accidently leaked urine? Y  Do you have problems with loss of bowel control? N  Managing your Medications? N  Managing your Finances? N  Housekeeping or managing your Housekeeping? N  Some recent data might be hidden    Patient Care Team: Tower, [name], MD as PCP - General (Family  Medicine) Carolan Clines, MD (Inactive) as Attending Physician (Urology) Minna Merritts, MD as Consulting Physician (Cardiology) Daryll Brod, MD as Referring Physician (Cardiology) Oneta Rack, MD as Consulting Physician (Dermatology) Ward, Jeani Hawking, MD as Referring Physician (Internal Medicine) Wynona Canes, MD as Referring Physician (Neurology) Kimmick, Linus Mako, MD as Referring Physician (Oncology) Gabriel Carina Betsey Holiday, MD as Physician Assistant (Internal Medicine) [name], MD as Consulting Physician (Family Medicine) Alvester Chou., DDS as Consulting Physician (Dentistry) Birder Robson, MD as Referring Physician (Ophthalmology) [name], [facility] as Pharmacist (Pharmacist)  Indicate any recent Medical Services you may have received from other than [facility] providers in the past year (date may be approximate).     Assessment:   This is a routine wellness examination for Morgan Hubbard.  Hearing/Vision screen Hearing Screening - Comments:: No issues Vision Screening - Comments:: Last exam 03/2021, Dr. Percell [place]   Dietary issues and exercise activities discussed: Current Exercise Habits: The patient does not participate in regular exercise at present   Goals Addressed             This Visit's Progress    Patient Stated       Would like to be more active       Depression Screen PHQ 2/9 Scores 05/30/2021 06/16/2020 06/10/2019 06/09/2018 05/23/2017 04/22/2016 01/02/2016  PHQ - 2 Score 0 0 0 0 0 0 0  PHQ- 9 Score - 2 0 - - - -    Fall Risk Fall Risk  05/30/2021 06/10/2019 06/09/2018 05/23/2017  04/22/2016  Falls in the past year? 1 0 0 Yes No  Comment - - - - -  Number falls in past yr: 1 0 0 1 -  Injury with Fall? 1 0 - No -  Comment hurt right shoulder, left hip - - - -  Risk for fall due to : Other (Comment) Medication side effect - - -  Risk for fall due to: Comment tripped - - - -  Follow up Falls prevention discussed Falls evaluation  completed;Falls prevention discussed - - -    FALL RISK PREVENTION PERTAINING TO THE HOME:  Any stairs in or around the home? Yes  If so, are there any without handrails? No  Home free of loose throw rugs in walkways, pet beds, electrical cords, etc? Yes  Adequate lighting in your home to reduce risk of falls? Yes   ASSISTIVE DEVICES UTILIZED TO PREVENT FALLS:  Life alert? No  Use of a cane, walker or w/c? Yes , cane Grab bars in the bathroom? Yes  Shower chair or bench in shower? Yes  Elevated toilet seat or a handicapped toilet? Yes   TIMED UP AND GO:  Was the test performed? No .    Cognitive Function: MMSE - Mini Mental State Exam 06/10/2019 04/22/2016  Orientation to time 5 5  Orientation to Place 5 5  Registration 3 3  Attention/ Calculation 5 0  Recall 3 3  Language- name 2 objects - 0  Language- repeat 1 1  Language- follow 3 step command - 3  Language- read & follow direction - 0  Write a sentence - 0  Copy design - 0  Total score - 20        Immunizations Immunization History  Administered Date(s) Administered   Fluad Quad(high Dose 65+) 02/14/2020, 02/15/2021   Influenza,inj,Quad PF,6+ Mos 05/24/2015, 04/03/2016, 03/09/2018   Influenza-Unspecified 03/29/2013, 04/11/2014, 04/03/2016, 03/17/2017   PFIZER(Purple Top)SARS-COV-2 Vaccination 07/16/2019, 08/06/2019, 03/03/2020   Pneumococcal Conjugate-13 06/13/2014   Pneumococcal Polysaccharide-23 01/14/2011    TDAP status: Due, Education has been provided regarding the importance of this vaccine. Advised may receive this vaccine at local pharmacy or Health Dept. Aware to provide a copy of the vaccination record if obtained from local pharmacy or Health Dept. Verbalized acceptance and understanding.  Flu Vaccine status: Up to date  Pneumococcal vaccine status: Up to date  Covid-19 vaccine status: Declined, Education has been provided regarding the importance of this vaccine but patient still declined.  Advised may receive this vaccine at local pharmacy or Health Dept.or vaccine clinic. Aware to provide a copy of the vaccination record if obtained from local pharmacy or Health Dept. Verbalized acceptance and understanding.  Qualifies for Shingles Vaccine? Yes   Zostavax completed No   Shingrix Completed?: No.    Education has been provided regarding the importance of this vaccine. Patient has been advised to call insurance company to determine out of pocket expense if they have not yet received this vaccine. Advised may also receive vaccine at local pharmacy or Health Dept. Verbalized acceptance and understanding.  Screening Tests Health Maintenance  Topic Date Due   Zoster Vaccines- Shingrix (1 of 2) Never done   HEMOGLOBIN A1C  07/06/2019   COVID-19 Vaccine (4 - Booster for Pfizer series) 04/28/2020   MAMMOGRAM  11/14/2020   OPHTHALMOLOGY EXAM  03/31/2021   TETANUS/TDAP  06/09/2028 (Originally 12/09/1953)   FOOT EXAM  06/16/2021   Pneumonia Vaccine 66+ Years old  Completed   INFLUENZA VACCINE  Completed  DEXA SCAN  Completed   HPV VACCINES  Aged Out    Health Maintenance  Health Maintenance Due  Topic Date Due   Zoster Vaccines- Shingrix (1 of 2) Never done   HEMOGLOBIN A1C  07/06/2019   COVID-19 Vaccine (4 - Booster for Pfizer series) 04/28/2020   MAMMOGRAM  11/14/2020   OPHTHALMOLOGY EXAM  03/31/2021    Colorectal cancer screening: No longer required.   Mammogram status: Completed 11/27/20. Repeat every year  Bone Density status: due, patient has a scheduled appointment 12/10/21  Lung Cancer Screening: (Low Dose CT Chest recommended if Age 84-80 years, 30 pack-year currently smoking OR have quit w/in 15years.) does not qualify.     Additional Screening:  Hepatitis C Screening: does not qualify;   Vision Screening: Recommended annual ophthalmology exams for early detection of glaucoma and other disorders of the eye. Is the patient up to date with their annual eye  exam?  Yes  Who is the provider or what is the name of the office in which the patient attends annual eye exams? Dr. Lu Duffel   Dental Screening: Recommended annual dental exams for proper oral hygiene  Community Resource Referral / Chronic Care Management: CRR required this visit?  No   CCM required this visit?  No      Plan:     I have personally reviewed and noted the following in the patients chart:   Medical and social history Use of alcohol, tobacco or illicit drugs  Current medications and supplements including opioid prescriptions.  Functional ability and status Nutritional status Physical activity Advanced directives List of other physicians Hospitalizations, surgeries, and ER visits in previous 12 months Vitals Screenings to include cognitive, depression, and falls Referrals and appointments  In addition, I have reviewed and discussed with patient certain preventive protocols, quality metrics, and best practice recommendations. A written personalized care plan for preventive services as well as general preventive health recommendations were provided to patient.   Due to this being a telephonic visit, the after visit summary with patients personalized plan was offered to patient via mail or my-chart. Patient would like to access on my-chart.     Loma Messing, LPN [id number]   Nurse Health Advisor  Nurse Notes: none

## 2021-05-30 ENCOUNTER — Ambulatory Visit (INDEPENDENT_AMBULATORY_CARE_PROVIDER_SITE_OTHER): Payer: Medicare HMO

## 2021-05-30 VITALS — Ht 61.0 in | Wt 142.0 lb

## 2021-05-30 DIAGNOSIS — Z0000 Encounter for general adult medical examination without abnormal findings: Secondary | ICD-10-CM

## 2021-05-30 NOTE — Patient Instructions (Signed)
Morgan Hubbard , Thank you for taking time to complete your Medicare Wellness Visit. I appreciate your ongoing commitment to your health goals. Please review the following plan we discussed and let me know if I can assist you in the future.   Screening recommendations/referrals: Colonoscopy: no longer required Mammogram: up to date, completed 11/27/20, please have results sent to PCP Bone Density: due, per our conversation you have a scheduled appointment 12/10/21 Recommended yearly ophthalmology/optometry visit for glaucoma screening and checkup Recommended yearly dental visit for hygiene and checkup  Vaccinations: Influenza vaccine: up to date Pneumococcal vaccine: up to date Tdap vaccine: Due-May obtain vaccine at your local pharmacy. Shingles vaccine: Discuss with your local pharmacy if you change your mind Covid-19:Discuss with your local pharmacy if you change your mind  Advanced directives: Please bring a copy of Living Will and/or [place] for your chart.   Conditions/risks identified: see problem list  Next appointment: Follow up in one year for your annual wellness visit    Preventive Care 65 Years and Older, Female Preventive care refers to lifestyle choices and visits with your health care provider that can promote health and wellness. What does preventive care include? A yearly physical exam. This is also called an annual well check. Dental exams once or twice a year. Routine eye exams. Ask your health care provider how often you should have your eyes checked. Personal lifestyle choices, including: Daily care of your teeth and gums. Regular physical activity. Eating a healthy diet. Avoiding tobacco and drug use. Limiting alcohol use. Practicing safe sex. Taking low-dose aspirin every day. Taking vitamin and mineral supplements as recommended by your health care provider. What happens during an annual well check? The services and screenings done by  your health care provider during your annual well check will depend on your age, overall health, lifestyle risk factors, and family history of disease. Counseling  Your health care provider may ask you questions about your: Alcohol use. Tobacco use. Drug use. Emotional well-being. Home and relationship well-being. Sexual activity. Eating habits. History of falls. Memory and ability to understand (cognition). Work and work Statistician. Reproductive health. Screening  You may have the following tests or measurements: Height, weight, and BMI. Blood pressure. Lipid and cholesterol levels. These may be checked every 5 years, or more frequently if you are over 84 years old. Skin check. Lung cancer screening. You may have this screening every year starting at age 84 if you have a 30-pack-year history of smoking and currently smoke or have quit within the past 15 years. Fecal occult blood test (FOBT) of the stool. You may have this test every year starting at age 84. Flexible sigmoidoscopy or colonoscopy. You may have a sigmoidoscopy every 5 years or a colonoscopy every 10 years starting at age 84. Hepatitis C blood test. Hepatitis B blood test. Sexually transmitted disease (STD) testing. Diabetes screening. This is done by checking your blood sugar (glucose) after you have not eaten for a while (fasting). You may have this done every 1-3 years. Bone density scan. This is done to screen for osteoporosis. You may have this done starting at age 84. Mammogram. This may be done every 1-2 years. Talk to your health care provider about how often you should have regular mammograms. Talk with your health care provider about your test results, treatment options, and if necessary, the need for more tests. Vaccines  Your health care provider may recommend certain vaccines, such as: Influenza vaccine. This is recommended  every year. Tetanus, diphtheria, and acellular pertussis (Tdap, Td) vaccine. You  may need a Td booster every 10 years. Zoster vaccine. You may need this after age 84. Pneumococcal 13-valent conjugate (PCV13) vaccine. One dose is recommended after age 84. Pneumococcal polysaccharide (PPSV23) vaccine. One dose is recommended after age 84. Talk to your health care provider about which screenings and vaccines you need and how often you need them. This information is not intended to replace advice given to you by your health care provider. Make sure you discuss any questions you have with your health care provider. Document Released: 06/16/2015 Document Revised: 02/07/2016 Document Reviewed: 03/21/2015 Elsevier Interactive Patient Education  2017 [place] Prevention in the Home Falls can cause injuries. They can happen to people of all ages. There are many things you can do to make your home safe and to help prevent falls. What can I do on the outside of my home? Regularly fix the edges of walkways and driveways and fix any cracks. Remove anything that might make you trip as you walk through a door, such as a raised step or threshold. Trim any bushes or trees on the path to your home. Use bright outdoor lighting. Clear any walking paths of anything that might make someone trip, such as rocks or tools. Regularly check to see if handrails are loose or broken. Make sure that both sides of any steps have handrails. Any raised decks and porches should have guardrails on the edges. Have any leaves, snow, or ice cleared regularly. Use sand or salt on walking paths during winter. Clean up any spills in your garage right away. This includes oil or grease spills. What can I do in the bathroom? Use night lights. Install grab bars by the toilet and in the tub and shower. Do not use towel bars as grab bars. Use non-skid mats or decals in the tub or shower. If you need to sit down in the shower, use a plastic, non-slip stool. Keep the floor dry. Clean up any water that spills  on the floor as soon as it happens. Remove soap buildup in the tub or shower regularly. Attach bath mats securely with double-sided non-slip rug tape. Do not have throw rugs and other things on the floor that can make you trip. What can I do in the bedroom? Use night lights. Make sure that you have a light by your bed that is easy to reach. Do not use any sheets or blankets that are too big for your bed. They should not hang down onto the floor. Have a firm chair that has side arms. You can use this for support while you get dressed. Do not have throw rugs and other things on the floor that can make you trip. What can I do in the kitchen? Clean up any spills right away. Avoid walking on wet floors. Keep items that you use a lot in easy-to-reach places. If you need to reach something above you, use a strong step stool that has a grab bar. Keep electrical cords out of the way. Do not use floor polish or wax that makes floors slippery. If you must use wax, use non-skid floor wax. Do not have throw rugs and other things on the floor that can make you trip. What can I do with my stairs? Do not leave any items on the stairs. Make sure that there are handrails on both sides of the stairs and use them. Fix handrails that are broken  or loose. Make sure that handrails are as long as the stairways. Check any carpeting to make sure that it is firmly attached to the stairs. Fix any carpet that is loose or worn. Avoid having throw rugs at the top or bottom of the stairs. If you do have throw rugs, attach them to the floor with carpet tape. Make sure that you have a light switch at the top of the stairs and the bottom of the stairs. If you do not have them, ask someone to add them for you. What else can I do to help prevent falls? Wear shoes that: Do not have high heels. Have rubber bottoms. Are comfortable and fit you well. Are closed at the toe. Do not wear sandals. If you use a stepladder: Make  sure that it is fully opened. Do not climb a closed stepladder. Make sure that both sides of the stepladder are locked into place. Ask someone to hold it for you, if possible. Clearly mark and make sure that you can see: Any grab bars or handrails. First and last steps. Where the edge of each step is. Use tools that help you move around (mobility aids) if they are needed. These include: Canes. Walkers. Scooters. Crutches. Turn on the lights when you go into a dark area. Replace any light bulbs as soon as they burn out. Set up your furniture so you have a clear path. Avoid moving your furniture around. If any of your floors are uneven, fix them. If there are any pets around you, be aware of where they are. Review your medicines with your doctor. Some medicines can make you feel dizzy. This can increase your chance of falling. Ask your doctor what other things that you can do to help prevent falls. This information is not intended to replace advice given to you by your health care provider. Make sure you discuss any questions you have with your health care provider. Document Released: 03/16/2009 Document Revised: 10/26/2015 Document Reviewed: 06/24/2014 Elsevier Interactive Patient Education  2017 [employer].

## 2021-06-05 ENCOUNTER — Encounter: Payer: Self-pay | Admitting: Family Medicine

## 2021-06-14 DIAGNOSIS — L814 Other melanin hyperpigmentation: Secondary | ICD-10-CM | POA: Diagnosis not present

## 2021-06-14 DIAGNOSIS — C44319 Basal cell carcinoma of skin of other parts of face: Secondary | ICD-10-CM | POA: Diagnosis not present

## 2021-06-14 DIAGNOSIS — D485 Neoplasm of uncertain behavior of skin: Secondary | ICD-10-CM | POA: Diagnosis not present

## 2021-06-21 DIAGNOSIS — H34832 Tributary (branch) retinal vein occlusion, left eye, with macular edema: Secondary | ICD-10-CM | POA: Diagnosis not present

## 2021-06-22 DIAGNOSIS — H34832 Tributary (branch) retinal vein occlusion, left eye, with macular edema: Secondary | ICD-10-CM | POA: Diagnosis not present

## 2021-07-16 ENCOUNTER — Other Ambulatory Visit: Payer: Self-pay

## 2021-07-16 ENCOUNTER — Encounter: Payer: Self-pay | Admitting: Family Medicine

## 2021-07-16 ENCOUNTER — Ambulatory Visit (INDEPENDENT_AMBULATORY_CARE_PROVIDER_SITE_OTHER): Payer: Medicare HMO | Admitting: Family Medicine

## 2021-07-16 VITALS — BP 128/66 | HR 67 | Temp 97.3°F | Ht 61.0 in | Wt 142.2 lb

## 2021-07-16 DIAGNOSIS — M19011 Primary osteoarthritis, right shoulder: Secondary | ICD-10-CM | POA: Diagnosis not present

## 2021-07-16 DIAGNOSIS — G8929 Other chronic pain: Secondary | ICD-10-CM

## 2021-07-16 DIAGNOSIS — M25511 Pain in right shoulder: Secondary | ICD-10-CM

## 2021-07-16 DIAGNOSIS — R198 Other specified symptoms and signs involving the digestive system and abdomen: Secondary | ICD-10-CM

## 2021-07-16 NOTE — Progress Notes (Signed)
Morgan Armendarez T. Linley Moskal, MD, [place] [facility] [place], [address]  Phone: [phone]   FAX: [phone]  Morgan Hubbard - 87 y.o. female   MRN [id number]   Date of Birth: 30-Mar-1935  Date: 07/16/2021   PCP: Abner Greenspan, MD   Referral: Abner Greenspan, MD  Chief Complaint  Patient presents with   Shoulder Pain    C/o R shoulder pain.  Started 5 mos ago after a fall.     Bloated    C/o bloating or bulge on left side of abd.  States she donated L kidney to her brother in 1989 and other doctors informed pt that was the cause.  Pt denies any pain in this area.     This visit occurred during the SARS-CoV-2 public health emergency.  Safety protocols were in place, including screening questions prior to the visit, additional usage of staff PPE, and extensive cleaning of exam room while observing appropriate contact time as indicated for disinfecting solutions.   Subjective:   Morgan Hubbard is a 87 y.o. very pleasant female patient with Body mass index is 26.87 kg/m. who presents with the following:  Very pleasant elderly patient at 69 had a fall during the autumn.  And has had some continued shoulder pain since then.  I last saw her in some office visit in March 19, 2021.  At that point I did give her a prescription for some physical therapy.    She did have a lytic lesion in her R clavicle, and I sent her to see [facility], where she is normally seen.  They were not concerned.  She is here today, and she still having some pain with abduction, but initially when I saw her she had remarkable loss of motion and weakness.  At this point this is improved greatly, but she is still having some occasional pain with abduction.  She also mentions some abdominal fullness.  This has been evaluated by other physicians.  It worsens with Valsalva.  Review of Systems is noted in the HPI, as appropriate  Objective:   BP 128/66     Pulse 67    Temp (!) 97.3 F (36.3 C) (Temporal)    Ht 5\' 1"  (1.549 m)    Wt 142 lb 3 oz (64.5 kg)    LMP 06/03/1992    SpO2 94%    BMI 26.87 kg/m   GEN: No acute distress; alert,appropriate. PULM: Breathing comfortably in no respiratory distress PSYCH: Normally interactive.    Shoulder: Right Inspection: No muscle wasting or winging Ecchymosis/edema: neg  AC joint, scapula, clavicle: NT Cervical spine: NT, full ROM Spurling's: neg Abduction: full, 5/5 -painful arc of motion, but passive range of motion is approaching full Flexion: full, 5/5 IR, full, lift-off: 5/5 ER at neutral: full, 5/5 AC crossover: neg Neer: pos Hawkins: pos Drop Test: neg Empty Can: pos Supraspinatus insertion: mild-mod T Bicipital groove: NT Speed's: neg Yergason's: neg Sulcus sign: neg Scapular dyskinesis: none C5-T1 intact  Neuro: Sensation intact Grip 5/5   ABD: S, NT, ND, + BS, No rebound, No HSM.  She does have some bulging with Valsalva on the left abdominal side.  Laboratory and Imaging Data: CLINICAL DATA:  Fall.  Evaluate for fracture.   EXAM: RIGHT SHOULDER - 2+ VIEW   COMPARISON:  None.   FINDINGS: Osteopenia. No acute fracture or dislocation identified. Degenerative changes are identified involving the acromioclavicular and  glenohumeral joints.   IMPRESSION: 1. No acute findings. 2. Osteoarthritis.     Electronically Signed   By: Kerby Moors M.D.   On: 02/19/2021 12:15  Procedure: CT Chest with IV Contrast  Procedure: CT Abdomen and Pelvis with IV Contrast   INDICATION: 87 years Female Breast cancer, invasive, stage I/II/III,  initial workup, History of breast cancer and indeterminate sclerotic  lesion; complete staging and biopsy planning, C50.311 Malignant neoplasm of  lower-inner quadrant of right female breast (CMS-HCC), Z17.0 Estrogen  receptor positive status (ER+), M89.9 Disorder of bone, unspecified   COMPARISON:  03/19/2021 chest radiographs and right  clavicular radiographs   Technique:  CT imaging was performed of the chest, abdomen, and pelvis  following the administration of intravenous and oral contrast. Iodinated  contrast was used due to the indications for the examination, to improve  disease detection and to further define anatomy. Coronal and sagittal  reconstructions as well as MIPS reconstructions were performed to  potentially increase study sensitivity.   FINDINGS:   Neck:   - Thyroid: No suspicious tracer uptake.   - Aerodigestive: No suspicious tracer uptake.   - Nodes: No suspicious tracer uptake.   Chest:   - Pulmonary: No suspicious tracer uptake. Left apical pulmonary nodule  measuring approximately 6 mm, which may be too small for definitive PET  characterization (image 91 (PET) and image 405 (CT)). No pulmonary  consolidation. Mild dependent atelectasis.   - Pleura: No suspicious tracer uptake. No pleural effusions.   - Cardiac: No suspicious tracer uptake. Left chest-wall triple lead  pacemaker.   - Nodes: Scattered subcentimeter mediastinal nodes with activity above  blood pool, favored reactive in etiology.   Abdomen/Pelvis:   - Liver: No suspicious tracer uptake. No concerning liver lesions.   - Billary: Status post cholecystectomy. No suspicious tracer uptake. No  biliary ductal dilatation.   - Spleen: No suspicious tracer uptake. No splenomegaly.   - Pancreas: No suspicious tracer uptake. No pancreatic ductal dilatation.   - Kidneys: Status post left nephrectomy. No suspicious tracer uptake.  No  hydronephrosis. A few scattered subcentimeter hypodensities which are too  small to characterize.   - Adrenal glands: No suspicious tracer uptake. No concerning nodules.   - Bowel: No suspicious tracer uptake. No bowel obstruction.   - Genitourinary: No suspicious tracer uptake.   - Nodes: No suspicious tracer uptake.   Musculoskeletal:   - Bones: No suspicious tracer uptake. No aggressive osseous  lesions.  Hypermetabolic area adjacent to the right acromioclavicular joint favored  secondary to degenerative changes (image 77 (PET) and image 342 (CT)).  Multilevel thoracolumbar degenerative changes, severe in the lumbar spine.   - Soft tissues: No suspicious tracer uptake.   IMPRESSION:   1.  No definite evidence of hypermetabolic metastatic disease.   2.  Asymmetric uptake about the right acromioclavicular joint favored  degenerative in nature.   3.  A few prominent subcentimeter mediastinal nodes demonstrate activity  mildly above blood pool. These are nonspecific, but favored reactive.  Attention on follow up.   4.  A 6 mm left upper lobe pulmonary node is too small for definitive PET  characterization. Attention on follow up.   Electronically Reviewed by:  Sharyne Richters, MD, [facility]  Electronically Reviewed on:  04/09/2021 5:32 PM   I have reviewed the images and concur with the above findings.   Electronically Signed by:  Katherine Basset, MD, [facility]  Electronically Signed on:  04/10/2021 12:05 PM  Assessment and Plan:     ICD-10-CM   1. Chronic right shoulder pain  M25.511    G89.29     2. Abdominal fullness  R19.8     3. Arthritis of right glenohumeral joint  M19.011      Chronic shoulder pain in the setting of prior trauma.  She also has a history of glenohumeral arthritis which is likely contributing.  With the trauma, she certainly could have sustained a significant tendon injury, and we will talk about this before and she could have a cuff tear.  She has done dramatically better compared to her initial evaluation she has very functional strength and range of motion.  I would like to keep removing the shoulder, and we are going to do a combined intra-articular and subacromial injection today.  Abdominal fullness.  She may have a hernia here.  PET scan reviewed, nothing concerning there.  I will defer this to her primary care physician for management.,   But I think this is very reassuring.  Aspiration/Injection Procedure Note ZASHA BELLEAU Apr 01, 1935 Date of procedure: 07/16/2021  Procedure: Large Joint Aspiration / Injection of Shoulder, Intraarticular, R Indications: Pain  Procedure Details Verbal consent was obtained from the patient. Risks explained and contrasted with benefits and alternatives. Patient prepped with Chloraprep and Ethyl Chloride used for anesthesia. An intraarticular shoulder injection was performed using the posterior approach. The patient tolerated the procedure well and had decreased pain post injection. No complications. Injection: 4 cc of Lidocaine 1% and 1/2 mL Kenalog 40 mg. Needle: 21 gauge, 2 inch Medication: 1/2 cc of Kenalog 40 mg (equaling Kenalog 20 mg)  Aspiration/Injection Procedure Note JULLIE ARPS [birth year] Date of procedure: 07/16/2021  Procedure: Large Joint Aspiration / Injection of Shoulder, Subacromial, R Indications: Pain  Procedure Details Verbal consent was obtained from the patient. Risks, benefits, and alternatives were explained. Patient prepped with Chloraprep and Ethyl Chloride used for anesthesia. The subacromial space was injected using the posterior approach. The patient tolerated the procedure well and had decreased pain post injection. No complications. Injection: 4 cc of Lidocaine 1% and 1/2 mL of Kenalog 40 mg. Needle: 22 gauge, 1 1/2 inch Medication: 1/2 cc of Kenalog 40 mg (equaling Kenalog 20 mg)   No orders of the defined types were placed in this encounter.  There are no discontinued medications. No orders of the defined types were placed in this encounter.   Follow-up: As needed  [employer] One speech-to-text software was used for transcription in this dictation.  Possible transcriptional errors can occur using Editor, commissioning.   Signed,  Maud Deed. Ralynn San, MD   Outpatient Encounter Medications as of 07/16/2021  Medication Sig   aspirin EC 81 MG tablet  Take 1 tablet (81 mg total) by mouth daily. Swallow whole.   atorvastatin (LIPITOR) 80 MG tablet Take 1 tablet (80 mg total) by mouth at bedtime.   carvedilol (COREG) 12.5 MG tablet Take 1 tablet (12.5 mg total) by mouth 2 (two) times daily with a meal.   cetirizine (ZYRTEC) 10 MG tablet Take 10 mg by mouth daily.   clopidogrel (PLAVIX) 75 MG tablet Take 1 tablet (75 mg total) by mouth daily.   clotrimazole-betamethasone (LOTRISONE) cream Apply 1 application topically as needed (groin).    ferrous sulfate 325 (65 FE) MG tablet Take 325 mg by mouth in the morning and at bedtime.   gabapentin (NEURONTIN) 300 MG capsule Take 1 capsule (300 mg total) by mouth 3 (three) times daily.  glimepiride (AMARYL) 2 MG tablet Take 2 mg by mouth daily.    glucose blood test strip Use 3 (three) times daily.   hydrALAZINE (APRESOLINE) 50 MG tablet Take 1 tablet (50 mg total) by mouth 2 (two) times daily. Take an extra hydralazine 50 mg as needed for pressure >170   letrozole (FEMARA) 2.5 MG tablet Take 2.5 mg by mouth daily.   losartan-hydrochlorothiazide (HYZAAR) 100-12.5 MG tablet Take 1 tablet by mouth daily.   metFORMIN (GLUCOPHAGE) 1000 MG tablet Take 1,000 mg by mouth in the morning and at bedtime.    mirabegron ER ([name]) 50 MG TB24 tablet Take 1 tablet (50 mg total) by mouth at bedtime.   nystatin cream (MYCOSTATIN) Apply 1 application topically 2 (two) times daily as needed (irritation.).    pantoprazole (PROTONIX) 40 MG tablet Take 1 tablet (40 mg total) by mouth daily.   traMADol (ULTRAM) 50 MG tablet TAKE 1 TO 2 TABLETS BY MOUTH EVERY 8 HOURS AS NEEDED FOR MODERATE TO SEVERE PAIN   vitamin B-12 (CYANOCOBALAMIN) 1000 MCG tablet Take 1,000 mcg by mouth daily.    Vitamin D, Ergocalciferol, (DRISDOL) 1.25 MG (50000 UT) CAPS capsule Take 50,000 Units by mouth every Monday.    isosorbide mononitrate (IMDUR) 60 MG 24 hr tablet Take 1 tablet (60 mg total) by mouth in the morning and at bedtime.   No  facility-administered encounter medications on file as of 07/16/2021.

## 2021-07-17 ENCOUNTER — Encounter: Payer: Self-pay | Admitting: Family Medicine

## 2021-07-19 DIAGNOSIS — H26491 Other secondary cataract, right eye: Secondary | ICD-10-CM | POA: Diagnosis not present

## 2021-07-20 DIAGNOSIS — E1169 Type 2 diabetes mellitus with other specified complication: Secondary | ICD-10-CM | POA: Diagnosis not present

## 2021-07-20 DIAGNOSIS — E1122 Type 2 diabetes mellitus with diabetic chronic kidney disease: Secondary | ICD-10-CM | POA: Diagnosis not present

## 2021-07-20 DIAGNOSIS — N1832 Chronic kidney disease, stage 3b: Secondary | ICD-10-CM | POA: Diagnosis not present

## 2021-07-20 DIAGNOSIS — E785 Hyperlipidemia, unspecified: Secondary | ICD-10-CM | POA: Diagnosis not present

## 2021-07-25 DIAGNOSIS — N3281 Overactive bladder: Secondary | ICD-10-CM | POA: Diagnosis not present

## 2021-07-25 DIAGNOSIS — N183 Chronic kidney disease, stage 3 unspecified: Secondary | ICD-10-CM | POA: Diagnosis not present

## 2021-07-25 DIAGNOSIS — I129 Hypertensive chronic kidney disease with stage 1 through stage 4 chronic kidney disease, or unspecified chronic kidney disease: Secondary | ICD-10-CM | POA: Diagnosis not present

## 2021-07-25 DIAGNOSIS — D649 Anemia, unspecified: Secondary | ICD-10-CM | POA: Diagnosis not present

## 2021-07-25 DIAGNOSIS — E1122 Type 2 diabetes mellitus with diabetic chronic kidney disease: Secondary | ICD-10-CM | POA: Diagnosis not present

## 2021-07-25 DIAGNOSIS — E875 Hyperkalemia: Secondary | ICD-10-CM | POA: Diagnosis not present

## 2021-07-25 DIAGNOSIS — R809 Proteinuria, unspecified: Secondary | ICD-10-CM | POA: Diagnosis not present

## 2021-07-25 DIAGNOSIS — K219 Gastro-esophageal reflux disease without esophagitis: Secondary | ICD-10-CM | POA: Diagnosis not present

## 2021-07-27 DIAGNOSIS — E1169 Type 2 diabetes mellitus with other specified complication: Secondary | ICD-10-CM | POA: Diagnosis not present

## 2021-07-27 DIAGNOSIS — R809 Proteinuria, unspecified: Secondary | ICD-10-CM | POA: Diagnosis not present

## 2021-07-27 DIAGNOSIS — E1122 Type 2 diabetes mellitus with diabetic chronic kidney disease: Secondary | ICD-10-CM | POA: Diagnosis not present

## 2021-07-27 DIAGNOSIS — E1165 Type 2 diabetes mellitus with hyperglycemia: Secondary | ICD-10-CM | POA: Diagnosis not present

## 2021-07-27 DIAGNOSIS — N1832 Chronic kidney disease, stage 3b: Secondary | ICD-10-CM | POA: Diagnosis not present

## 2021-07-27 DIAGNOSIS — E1129 Type 2 diabetes mellitus with other diabetic kidney complication: Secondary | ICD-10-CM | POA: Diagnosis not present

## 2021-07-27 DIAGNOSIS — E785 Hyperlipidemia, unspecified: Secondary | ICD-10-CM | POA: Diagnosis not present

## 2021-08-01 DIAGNOSIS — I428 Other cardiomyopathies: Secondary | ICD-10-CM | POA: Diagnosis not present

## 2021-08-01 DIAGNOSIS — Z95810 Presence of automatic (implantable) cardiac defibrillator: Secondary | ICD-10-CM | POA: Diagnosis not present

## 2021-08-01 DIAGNOSIS — I10 Essential (primary) hypertension: Secondary | ICD-10-CM | POA: Diagnosis not present

## 2021-08-02 DIAGNOSIS — C44319 Basal cell carcinoma of skin of other parts of face: Secondary | ICD-10-CM | POA: Diagnosis not present

## 2021-08-08 ENCOUNTER — Other Ambulatory Visit: Payer: Self-pay | Admitting: Family Medicine

## 2021-08-09 NOTE — Telephone Encounter (Signed)
Name of Medication: tramadol 50  gm ?Name of Pharmacy: [facility] [address] ?Last Fill or Written Date and Quantity: # 60 on 05/15/21 ?Last Office Visit and Type: 06/16/2020  annual exam ?Next Office Visit and Type:f/u on 04/25/21 but pt has recent appt with Dr. Lorelei Pont ?

## 2021-08-26 DIAGNOSIS — Z95810 Presence of automatic (implantable) cardiac defibrillator: Secondary | ICD-10-CM | POA: Diagnosis not present

## 2021-08-29 DIAGNOSIS — N390 Urinary tract infection, site not specified: Secondary | ICD-10-CM | POA: Diagnosis not present

## 2021-08-29 DIAGNOSIS — N183 Chronic kidney disease, stage 3 unspecified: Secondary | ICD-10-CM | POA: Diagnosis not present

## 2021-08-30 DIAGNOSIS — H34832 Tributary (branch) retinal vein occlusion, left eye, with macular edema: Secondary | ICD-10-CM | POA: Diagnosis not present

## 2021-09-19 ENCOUNTER — Ambulatory Visit (INDEPENDENT_AMBULATORY_CARE_PROVIDER_SITE_OTHER): Payer: Medicare HMO | Admitting: Family Medicine

## 2021-09-19 ENCOUNTER — Other Ambulatory Visit: Payer: Self-pay | Admitting: Family Medicine

## 2021-09-19 ENCOUNTER — Other Ambulatory Visit: Payer: Self-pay | Admitting: Cardiovascular Disease

## 2021-09-19 ENCOUNTER — Encounter: Payer: Self-pay | Admitting: Family Medicine

## 2021-09-19 VITALS — BP 140/62 | HR 87 | Temp 98.2°F | Ht 61.0 in | Wt 139.1 lb

## 2021-09-19 DIAGNOSIS — M25512 Pain in left shoulder: Secondary | ICD-10-CM

## 2021-09-19 NOTE — Progress Notes (Signed)
? ? ?Morgan Hubbard T. Morgan Magana, MD, Morgan Hubbard ?Therapist, music at [facility] ?[name] ?[name], [address] ? ?Phone: [phone]  FAX: [phone] ? ?Morgan Hubbard - 87 y.o. female  MRN [id number]  Date of Birth: January 21, 1935 ? ?Date: 09/19/2021  PCP: Morgan Greenspan, MD  Referral: Morgan Greenspan, MD ? ?Chief Complaint  ?Patient presents with  ? Shoulder Pain  ?  Left-Popped Sunday night  ? ? ?This visit occurred during the SARS-CoV-2 public health emergency.  Safety protocols were in place, including screening questions prior to the visit, additional usage of staff PPE, and extensive cleaning of exam room while observing appropriate contact time as indicated for disinfecting solutions.  ? ?Subjective:  ? ?Morgan Hubbard is a 87 y.o. very pleasant female patient with Body mass index is 26.28 kg/m?. who presents with the following: ? ?She is a pleasant lady who is known very well.  I have seen her multiple times over the years.  Roughly 4 to 5 days ago, the patient did have some acute shoulder pain on the left side and she felt and had an audible pop.  Since then she has been having a lot of problems using the shoulder, but this does seem a little bit better today compared to yesterday.  She does have some deep soreness in the shoulder. ? ?The right shoulder has been the primary issue post injury in the fall, and thankfully this is improved and is doing well. ? ?She did not have any sort of fall or injury whatsoever.  Her strength is been preserved. ? ?Review of Systems is noted in the HPI, as appropriate ? ?Objective:  ? ?BP 140/62   Pulse 87   Temp 98.2 ?F (36.8 ?C) (Oral)   Ht '5\' 1"'$  (1.549 m)   Wt 139 lb 1 oz (63.1 kg)   LMP 06/03/1992   SpO2 94%   BMI 26.28 kg/m?  ? ?GEN: No acute distress; alert,appropriate. ?PULM: Breathing comfortably in no respiratory distress ?PSYCH: Normally interactive.  ? ?Left shoulder: She is able to actively elevate the shoulder to roughly 160 degrees  of abduction.  Terminal abduction and flexion can easily be achieved. ?She does have some pain but approaching full range of motion with internal and external range of motion. ?Abduction is 4 -/5 ?Additional strength testing is 5/5 ? ?Nontender along the clavicle, bicipital groove, AC joint. ?Jobe test, Michel Bickers test are negative. ?Speeds and Yergason's are negative. ? ?She does have some apprehension and pain at the [facility].  She does have some pain with loading the shoulder joint.  There is some discomfort or apprehension with sulcus testing. ? ?Laboratory and Imaging Data: ? ?Assessment and Plan:  ? ?  ICD-10-CM   ?1. Acute pain of left shoulder  M25.512   ?  ? ?Acute pain after injury over the weekend.  She seems to be improving, so for now I am going to have her start with some motion, similar to what she did with her right shoulder in the fall. ? ?Doubt rotator cuff injury.  By history it sounds like she may have had a subluxation event as well as exam.  Typically these do well with older people.  Relative rest additionally for the next 2 weeks.  Anticipate improvement within the next few weeks. ? ?No orders of the defined types were placed in this encounter. ? ?There are no discontinued medications. ?No orders of the defined types were placed in this  encounter. ? ? ?Follow-up: No follow-ups on file. ? ?Dragon [employer] One speech-to-text software was used for transcription in this dictation.  Possible transcriptional errors can occur using Editor, commissioning.  ? ?Signed, ? ?Kashif Pooler T. Idrees Quam, MD ? ? ?Outpatient Encounter Medications as of 09/19/2021  ?Medication Sig  ? aspirin EC 81 MG tablet Take 1 tablet (81 mg total) by mouth daily. Swallow whole.  ? atorvastatin (LIPITOR) 80 MG tablet Take 1 tablet (80 mg total) by mouth at bedtime.  ? carvedilol (COREG) 12.5 MG tablet Take 1 tablet (12.5 mg total) by mouth 2 (two) times daily with a meal.  ? cetirizine (ZYRTEC) 10 MG tablet Take 10 mg by mouth daily.   ? clotrimazole-betamethasone (LOTRISONE) cream Apply 1 application topically as needed (groin).   ? FARXIGA 5 MG TABS tablet Take 5 mg by mouth daily.  ? ferrous sulfate 325 (65 FE) MG tablet Take 325 mg by mouth in the morning and at bedtime.  ? gabapentin (NEURONTIN) 300 MG capsule Take 1 capsule (300 mg total) by mouth 3 (three) times daily.  ? glimepiride (AMARYL) 2 MG tablet Take 2 mg by mouth daily.   ? glucose blood test strip Use 3 (three) times daily.  ? hydrALAZINE (APRESOLINE) 50 MG tablet Take 1 tablet (50 mg total) by mouth 2 (two) times daily. Take an extra hydralazine 50 mg as needed for pressure >170  ? letrozole (FEMARA) 2.5 MG tablet Take 2.5 mg by mouth daily.  ? metFORMIN (GLUCOPHAGE) 1000 MG tablet Take 1,000 mg by mouth in the morning and at bedtime.   ? mirabegron ER ([name]) 50 MG TB24 tablet Take 1 tablet (50 mg total) by mouth at bedtime.  ? nystatin cream (MYCOSTATIN) Apply 1 application topically 2 (two) times daily as needed (irritation.).   ? traMADol (ULTRAM) 50 MG tablet TAKE 1 TO 2 TABLETS BY MOUTH EVERY 8 HOURS AS NEEDED FOR MODERATE TO SEVERE PAIN  ? TRULICITY 5.99 JT/7.0VX SOPN SMARTSIG:1 unspecified SUB-Q Once a Week  ? vitamin B-12 (CYANOCOBALAMIN) 1000 MCG tablet Take 1,000 mcg by mouth daily.   ? Vitamin D, Ergocalciferol, (DRISDOL) 1.25 MG (50000 UT) CAPS capsule Take 50,000 Units by mouth every Monday.   ? [DISCONTINUED] clopidogrel (PLAVIX) 75 MG tablet Take 1 tablet (75 mg total) by mouth daily.  ? [DISCONTINUED] losartan-hydrochlorothiazide (HYZAAR) 100-12.5 MG tablet Take 1 tablet by mouth daily.  ? [DISCONTINUED] pantoprazole (PROTONIX) 40 MG tablet Take 1 tablet (40 mg total) by mouth daily.  ? isosorbide mononitrate (IMDUR) 60 MG 24 hr tablet Take 1 tablet (60 mg total) by mouth in the morning and at bedtime.  ? ?No facility-administered encounter medications on file as of 09/19/2021.  ?  ?

## 2021-09-20 ENCOUNTER — Encounter: Payer: Self-pay | Admitting: Family Medicine

## 2021-10-23 ENCOUNTER — Other Ambulatory Visit: Payer: Self-pay | Admitting: Family Medicine

## 2021-10-23 NOTE — Telephone Encounter (Signed)
ame of Medication: tramadol 50  gm Name of Pharmacy: [facility] [address] Last Fill or Written Date and Quantity: # 40 on 08/09/21 Last Office Visit and Type: appt with Dr. Lorelei Pont on 09/19/21 Next Office Visit and Type: none scheduled

## 2021-10-30 DIAGNOSIS — E1169 Type 2 diabetes mellitus with other specified complication: Secondary | ICD-10-CM | POA: Diagnosis not present

## 2021-10-30 DIAGNOSIS — E785 Hyperlipidemia, unspecified: Secondary | ICD-10-CM | POA: Diagnosis not present

## 2021-11-02 DIAGNOSIS — E1122 Type 2 diabetes mellitus with diabetic chronic kidney disease: Secondary | ICD-10-CM | POA: Diagnosis not present

## 2021-11-02 DIAGNOSIS — N1832 Chronic kidney disease, stage 3b: Secondary | ICD-10-CM | POA: Diagnosis not present

## 2021-11-05 ENCOUNTER — Ambulatory Visit (INDEPENDENT_AMBULATORY_CARE_PROVIDER_SITE_OTHER): Payer: Medicare HMO | Admitting: Family Medicine

## 2021-11-05 ENCOUNTER — Encounter: Payer: Self-pay | Admitting: Family Medicine

## 2021-11-05 VITALS — BP 138/82 | HR 61 | Ht 61.0 in | Wt 142.0 lb

## 2021-11-05 DIAGNOSIS — D649 Anemia, unspecified: Secondary | ICD-10-CM | POA: Diagnosis not present

## 2021-11-05 DIAGNOSIS — R110 Nausea: Secondary | ICD-10-CM | POA: Diagnosis not present

## 2021-11-05 DIAGNOSIS — K219 Gastro-esophageal reflux disease without esophagitis: Secondary | ICD-10-CM | POA: Diagnosis not present

## 2021-11-05 LAB — BASIC METABOLIC PANEL
BUN: 16 mg/dL (ref 6–23)
CO2: 33 mEq/L — ABNORMAL HIGH (ref 19–32)
Calcium: 8.9 mg/dL (ref 8.4–10.5)
Chloride: 103 mEq/L (ref 96–112)
Creatinine, Ser: 1.27 mg/dL — ABNORMAL HIGH (ref 0.40–1.20)
GFR: 38.72 mL/min — ABNORMAL LOW (ref >60.00)
Glucose, Bld: 236 mg/dL — ABNORMAL HIGH (ref 70–99)
Potassium: 5.5 mEq/L — ABNORMAL HIGH (ref 3.5–5.1)
Sodium: 142 mEq/L (ref 135–145)

## 2021-11-05 LAB — CBC WITH DIFFERENTIAL/PLATELET
Basophils Absolute: 0 10*3/uL (ref 0.0–0.1)
Basophils Relative: 0.4 % (ref 0.0–3.0)
Eosinophils Absolute: 0.3 10*3/uL (ref 0.0–0.7)
Eosinophils Relative: 4.3 % (ref 0.0–5.0)
HCT: 37.6 % (ref 36.0–46.0)
Hemoglobin: 12.3 g/dL (ref 12.0–15.0)
Lymphocytes Relative: 21.9 % (ref 12.0–46.0)
Lymphs Abs: 1.5 10*3/uL (ref 0.7–4.0)
MCHC: 32.8 g/dL (ref 30.0–36.0)
MCV: 92.6 fl (ref 78.0–100.0)
Monocytes Absolute: 0.5 10*3/uL (ref 0.1–1.0)
Monocytes Relative: 6.8 % (ref 3.0–12.0)
Neutro Abs: 4.6 10*3/uL (ref 1.4–7.7)
Neutrophils Relative %: 66.6 % (ref 43.0–77.0)
Platelets: 196 10*3/uL (ref 150.0–400.0)
RBC: 4.06 Mil/uL (ref 3.87–5.11)
RDW: 14.2 % (ref 11.5–15.5)
WBC: 6.8 10*3/uL (ref 4.0–10.5)

## 2021-11-05 LAB — HEPATIC FUNCTION PANEL
ALT: 8 U/L (ref 0–35)
AST: 15 U/L (ref 0–37)
Albumin: 3.3 g/dL — ABNORMAL LOW (ref 3.5–5.2)
Alkaline Phosphatase: 55 U/L (ref 39–117)
Bilirubin, Direct: 0.1 mg/dL (ref 0.0–0.3)
Total Bilirubin: 0.6 mg/dL (ref 0.2–1.2)
Total Protein: 5.5 g/dL — ABNORMAL LOW (ref 6.0–8.3)

## 2021-11-05 LAB — IRON: Iron: 81 ug/dL (ref 42–145)

## 2021-11-05 LAB — FERRITIN: Ferritin: 27.2 ng/mL (ref 10.0–291.0)

## 2021-11-05 LAB — LIPASE: Lipase: 6 U/L — ABNORMAL LOW (ref 11.0–59.0)

## 2021-11-05 MED ORDER — FAMOTIDINE 20 MG PO TABS: 20.0000 mg | ORAL_TABLET | Freq: Two times a day (BID) | ORAL | 1 refills | Status: DC

## 2021-11-05 NOTE — Progress Notes (Signed)
Subjective:    Patient ID: Morgan Hubbard, female    DOB: [birth year], 87 y.o.   MRN: [id number]  HPI Pt presents for GI issues   Wt Readings from Last 3 Encounters:  11/05/21 142 lb (64.4 kg)  09/19/21 139 lb 1 oz (63.1 kg)  07/16/21 142 lb 3 oz (64.5 kg)   26.83 kg/m  3 weeks  Eating makes her sick  Dry heaves (seldom comes up) Nausea  Discomfort /not a lot of pain   Worse when she is hungry No blood from either direction  No black stool   Had ulcer years ago  These symptoms are similar    Past h/o ccy  Also fatty liver    She had a biventricular implantable cardioverter-devib in setting of non ischemic cardiomyopathy   Also history of lower/inner r breast cancer  Also diabetes   Sees Dr Gabriel Carina Added Trulicity last time - took for a month and then stopped/ did not like it   Patient Active Problem List   Diagnosis Date Noted   Nausea 11/05/2021   Shoulder pain 04/25/2021   History of breast cancer 06/18/2020   Anemia 06/16/2020   Elevated TSH 06/16/2020   Occipital neuralgia 02/14/2020   Hair loss 11/09/2018   Leg weakness, bilateral 06/29/2018   Pedal edema 06/22/2018   AICD (automatic cardioverter/defibrillator) present 06/11/2018   GERD (gastroesophageal reflux disease) 06/10/2018   Cholecystitis 06/10/2018   Tingling 10/06/2017   Chronic back pain 25/42/7062   Chronic systolic (congestive) heart failure ([place]) 02/25/2017   Osteopenia 06/16/2016   Routine general medical examination at a health care facility 04/24/2016   Estrogen deficiency 04/24/2016   Urticaria 11/03/2015   Cerumen impaction 03/10/2015   Encounter for Medicare annual wellness exam 06/13/2014   Stroke, small vessel ([place]) 01/18/2014   Family history of hemochromatosis 01/18/2014   Cerebral thrombosis with cerebral infarction ([place]) 01/14/2014   Expressive aphasia 01/13/2014   Nonischemic cardiomyopathy ([place]) 04/05/2013   Shortness of breath 09/03/2012   Elevated transaminase level  05/18/2012   Spinal stenosis of lumbar region 12/10/2011   Mixed incontinence urge and stress 12/10/2011   Renal insufficiency 11/04/2011   Goiter 01/14/2011   Fatty liver 08/28/2010   Type II diabetes mellitus with renal manifestations ([place]) 07/09/2010   Hyperlipidemia associated with type 2 diabetes mellitus ([place]) 07/09/2010   Hyperkalemia 07/09/2010   REFLEX SYMPATHETIC DYSTROPHY 07/09/2010   Neuropathy 07/09/2010   Essential hypertension 07/09/2010   CARDIOMYOPATHY 07/09/2010   OVERACTIVE BLADDER 07/09/2010   Past Medical History:  Diagnosis Date   Arthritis    "hands" (01/13/2014)   Basal cell carcinoma 01/2014   "bridge of nose"   Cardiac LV ejection fraction >40%    "it was 43 last year" (01/13/2014)   Chronic lower back pain    CKD (chronic kidney disease), stage III ([place])    acute on chronic stage III/notes 06/10/2018   Colon polyps    Expressive aphasia    "3 times in the last week" (01/13/2014)   GERD (gastroesophageal reflux disease)    Goiter past remote   treated with RI   Hyperlipidemia    Hyperpotassemia    Hypertension    Hypertonicity of bladder    Inflammatory and toxic neuropathy, unspecified    LBBB (left bundle branch block)    Migraine    "stopped many years ago" (01/13/2014)   Mini stroke 01/2014   Other primary cardiomyopathies    Overactive bladder    Presence of combination  internal cardiac defibrillator (ICD) and pacemaker    Reflex sympathetic dystrophy, unspecified    Shingles    Shortness of breath    ambulation   Stroke ([place])    Thyroid disease    Type II diabetes mellitus ([place])    Ulcer    Urine incontinence    Vertigo    hx of   Past Surgical History:  Procedure Laterality Date   BACK SURGERY     BI-VENTRICULAR PACEMAKER INSERTION (CRT-P)  10/2014   [facility]   BREAST CYST EXCISION Left 1959   CARDIAC CATHETERIZATION  "several"   [place]   CATARACT EXTRACTION W/ INTRAOCULAR LENS IMPLANT Left ~ 2005   several eye injections    CATARACT EXTRACTION W/PHACO Right 05/04/2020   Procedure: CATARACT EXTRACTION PHACO AND INTRAOCULAR LENS PLACEMENT ([place]) RIGHT DIABETIC;  Surgeon: Birder Robson, MD;  Location: [facility];  Service: Ophthalmology;  Laterality: Right;  [place] 00:58.5 CDE 6.70 Fluid Pack [place] # [id number]   CHOLECYSTECTOMY N/A 06/13/2018   Procedure: LAPAROSCOPIC CHOLECYSTECTOMY;  Surgeon: Donnie Mesa, MD;  Location: [place];  Service: General;  Laterality: N/A;   COLONOSCOPY  7/12   normal (hx of polyps in past)    CT SCAN  3/12   outside hosp- lung nodule and gallstones   ERCP N/A 06/12/2018   Procedure: ENDOSCOPIC RETROGRADE CHOLANGIOPANCREATOGRAPHY (ERCP);  Surgeon: Carol Ada, MD;  Location: [place];  Service: Endoscopy;  Laterality: N/A;   [place]   "knot on index"   KIDNEY DONATION Left 1989   LUMBAR LAMINECTOMY/DECOMPRESSION MICRODISCECTOMY  1999   LUMBAR LAMINECTOMY/DECOMPRESSION MICRODISCECTOMY  01/31/2012   Procedure: LUMBAR LAMINECTOMY/DECOMPRESSION MICRODISCECTOMY 2 LEVELS;  Surgeon: Eustace Moore, MD;  Location: [facility]URO [facility]S;  Service: Neurosurgery;  Laterality: N/A;  Thoracic twelve-lumbar one, lumbar one-two laminectomy    POSTERIOR LAMINECTOMY / DECOMPRESSION CERVICAL SPINE  1999   REMOVAL OF STONES  06/12/2018   Procedure: REMOVAL OF STONES;  Surgeon: Carol Ada, MD;  Location: [facility];  Service: Endoscopy;;   SPHINCTEROTOMY  06/12/2018   Procedure: Joan Mayans;  Surgeon: Carol Ada, MD;  Location: [facility] END[facility];  Service: Endoscopy;;   TUBAL LIGATION  1976   UPPER GASTROINTESTINAL ENDOSCOPY     Social History   Tobacco Use   Smoking status: Never   Smokeless tobacco: Never  Vaping Use   Vaping Use: Never used  Substance Use Topics   Alcohol use: No    Alcohol/week: 0.0 standard drinks   Drug use: No   Family History  Problem Relation Age of Onset   Arthritis Mother    Cancer Mother        uterine and mouth   Hyperlipidemia Mother     Stroke Mother    Hypertension Mother    Alcohol abuse Father    Diabetes Father    Cancer Sister        breast   Diabetes Sister    Cancer Brother        lung cancer   Kidney disease Brother    Diabetes Brother    Hyperlipidemia Sister    Heart disease Sister    Hypertension Sister    Diabetes Sister    Hyperlipidemia Brother    Kidney failure Brother    Diabetes Daughter    Addison's disease Other    Allergies  Allergen Reactions   Ace Inhibitors Swelling    REACTION: tongue swelling   Codeine Nausea Only   Hydrochlorothiazide Other (See Comments)  Dizziness/funny feeling   Insulin Detemir Itching   Lisinopril Swelling    Swelling of tongue   Nsaids Other (See Comments)    Renal issues   Tylenol [Acetaminophen] Other (See Comments)    Fatty liver   Current Outpatient Medications on File Prior to Visit  Medication Sig Dispense Refill   aspirin EC 81 MG tablet Take 1 tablet (81 mg total) by mouth daily. Swallow whole. 90 tablet 3   atorvastatin (LIPITOR) 80 MG tablet Take 1 tablet (80 mg total) by mouth at bedtime. 90 tablet 3   carvedilol (COREG) 12.5 MG tablet Take 1 tablet (12.5 mg total) by mouth 2 (two) times daily with a meal. 180 tablet 3   cetirizine (ZYRTEC) 10 MG tablet Take 10 mg by mouth daily.     clopidogrel (PLAVIX) 75 MG tablet Take 1 tablet by mouth once daily 90 tablet 0   clotrimazole-betamethasone (LOTRISONE) cream Apply 1 application topically as needed (groin).      FARXIGA 5 MG TABS tablet Take 5 mg by mouth daily.     gabapentin (NEURONTIN) 300 MG capsule Take 1 capsule (300 mg total) by mouth 3 (three) times daily. 270 capsule 3   glimepiride (AMARYL) 2 MG tablet Take 2 mg by mouth daily.      glucose blood test strip Use 3 (three) times daily.     hydrALAZINE (APRESOLINE) 50 MG tablet Take 1 tablet (50 mg total) by mouth 2 (two) times daily. Take an extra hydralazine 50 mg as needed for pressure >170 100 tablet 3   letrozole (FEMARA) 2.5 MG  tablet Take 2.5 mg by mouth daily.     losartan-hydrochlorothiazide (HYZAAR) 100-12.5 MG tablet Take 1 tablet by mouth once daily 90 tablet 0   metFORMIN (GLUCOPHAGE) 1000 MG tablet Take 1,000 mg by mouth in the morning and at bedtime.      mirabegron ER ([name]) 50 MG TB24 tablet Take 1 tablet (50 mg total) by mouth at bedtime. 90 tablet 3   nystatin cream (MYCOSTATIN) Apply 1 application topically 2 (two) times daily as needed (irritation.).      pantoprazole (PROTONIX) 40 MG tablet Take 1 tablet by mouth once daily 90 tablet 1   traMADol (ULTRAM) 50 MG tablet TAKE 1 TO 2 TABLETS BY MOUTH EVERY 8 HOURS AS NEEDED FOR MODERATE TO SEVERE PAIN 60 tablet 0   TRULICITY 8.78 MV/6.7MC SOPN SMARTSIG:1 unspecified SUB-Q Once a Week     vitamin B-12 (CYANOCOBALAMIN) 1000 MCG tablet Take 1,000 mcg by mouth daily.      Vitamin D, Ergocalciferol, (DRISDOL) 1.25 MG (50000 UT) CAPS capsule Take 50,000 Units by mouth every Monday.      ferrous sulfate 325 (65 FE) MG tablet Take 325 mg by mouth in the morning and at bedtime. (Patient not taking: Reported on 11/05/2021)     isosorbide mononitrate (IMDUR) 60 MG 24 hr tablet Take 1 tablet (60 mg total) by mouth in the morning and at bedtime. 90 tablet 3   No current facility-administered medications on file prior to visit.    Review of Systems  Constitutional:  Positive for fatigue. Negative for activity change, appetite change, fever and unexpected weight change.  HENT:  Negative for congestion, ear pain, rhinorrhea, sinus pressure and sore throat.   Eyes:  Negative for pain, redness and visual disturbance.  Respiratory:  Negative for cough, shortness of breath and wheezing.   Cardiovascular:  Negative for chest pain and palpitations.  Gastrointestinal:  Positive for nausea  and vomiting. Negative for abdominal distention, abdominal pain, anal bleeding, blood in stool, constipation, diarrhea and rectal pain.  Endocrine: Negative for polydipsia and polyuria.   Genitourinary:  Negative for dysuria, frequency and urgency.  Musculoskeletal:  Negative for arthralgias, back pain and myalgias.  Skin:  Negative for pallor and rash.  Allergic/Immunologic: Negative for environmental allergies.  Neurological:  Negative for dizziness, syncope and headaches.  Hematological:  Negative for adenopathy. Does not bruise/bleed easily.  Psychiatric/Behavioral:  Negative for decreased concentration and dysphoric mood. The patient is not nervous/anxious.       Objective:   Physical Exam Constitutional:      General: She is not in acute distress.    Appearance: Normal appearance. She is well-developed and normal weight. She is not ill-appearing.  HENT:     Head: Normocephalic and atraumatic.  Eyes:     General: No scleral icterus.    Conjunctiva/sclera: Conjunctivae normal.     Pupils: Pupils are equal, round, and reactive to light.  Cardiovascular:     Rate and Rhythm: Normal rate and regular rhythm.     Heart sounds: Normal heart sounds.  Pulmonary:     Effort: Pulmonary effort is normal. No respiratory distress.     Breath sounds: Normal breath sounds. No wheezing or rales.  Abdominal:     General: Abdomen is flat. Bowel sounds are normal. There is no distension.     Palpations: Abdomen is soft. There is no hepatomegaly, splenomegaly, mass or pulsatile mass.     Tenderness: There is no abdominal tenderness. There is no guarding or rebound. Negative signs include Murphy's sign and McBurney's sign.     Hernia: No hernia is present.  Musculoskeletal:     Cervical back: Normal range of motion and neck supple.  Lymphadenopathy:     Cervical: No cervical adenopathy.  Skin:    General: Skin is warm and dry.     Coloration: Skin is not jaundiced or pale.     Findings: No bruising, erythema or rash.  Neurological:     Mental Status: She is alert.          Assessment & Plan:   Problem List Items Addressed This Visit       Digestive   GERD  (gastroesophageal reflux disease)    Generally well controlled with protonix 40 mg daily  Now some nausea-unsure if related Remote history of past ulcer reviewed  Adding pepcid 20 mg twice daily  Lab today for nausea        Relevant Medications   famotidine (PEPCID) 20 MG tablet     Other   Anemia    Cbc, ferritin/iron   Past h/o PUD Now some nausea       Relevant Orders   Iron   Ferritin   Nausea - Primary    New for 3 weeks  Dry heaves No pain  Past h/o ulcer ?  Also past h/o ERCP-reviewed   On protonix Added pepcid No nsaids Lab today  Low threshold for GI attn if needed   No longer takes trulicity  DM gastroparesis is in the differential        Relevant Orders   Basic metabolic panel   CBC with Differential/Platelet   Hepatic function panel   Lipase

## 2021-11-05 NOTE — Patient Instructions (Signed)
Continue protonix   Add generic pepcid twice daily   Labs today   We will make a plan from there

## 2021-11-05 NOTE — Assessment & Plan Note (Addendum)
New for 3 weeks  Dry heaves No pain  Past h/o ulcer ?  Also past h/o ERCP-reviewed   On protonix Added pepcid No nsaids Lab today  Low threshold for GI attn if needed   No longer takes trulicity  DM gastroparesis is in the differential

## 2021-11-05 NOTE — Assessment & Plan Note (Signed)
Cbc, ferritin/iron   Past h/o PUD Now some nausea

## 2021-11-05 NOTE — Assessment & Plan Note (Signed)
Generally well controlled with protonix 40 mg daily  Now some nausea-unsure if related Remote history of past ulcer reviewed  Adding pepcid 20 mg twice daily  Lab today for nausea

## 2021-11-06 ENCOUNTER — Telehealth: Payer: Self-pay

## 2021-11-06 DIAGNOSIS — I10 Essential (primary) hypertension: Secondary | ICD-10-CM | POA: Diagnosis not present

## 2021-11-06 DIAGNOSIS — E1169 Type 2 diabetes mellitus with other specified complication: Secondary | ICD-10-CM | POA: Diagnosis not present

## 2021-11-06 DIAGNOSIS — E785 Hyperlipidemia, unspecified: Secondary | ICD-10-CM | POA: Diagnosis not present

## 2021-11-06 DIAGNOSIS — E1122 Type 2 diabetes mellitus with diabetic chronic kidney disease: Secondary | ICD-10-CM | POA: Diagnosis not present

## 2021-11-06 DIAGNOSIS — N1832 Chronic kidney disease, stage 3b: Secondary | ICD-10-CM | POA: Diagnosis not present

## 2021-11-06 DIAGNOSIS — R809 Proteinuria, unspecified: Secondary | ICD-10-CM | POA: Diagnosis not present

## 2021-11-06 DIAGNOSIS — E1129 Type 2 diabetes mellitus with other diabetic kidney complication: Secondary | ICD-10-CM | POA: Diagnosis not present

## 2021-11-06 NOTE — Telephone Encounter (Signed)
I left a message for the patient to return my call.

## 2021-11-06 NOTE — Telephone Encounter (Signed)
-----   Message from Abner Greenspan, MD sent at 11/05/2021  8:26 PM EDT ----- Please schedule a follow up appt in 2-3 weeks

## 2021-11-08 ENCOUNTER — Telehealth: Payer: Self-pay

## 2021-11-08 NOTE — Telephone Encounter (Signed)
-----   Message from Abner Greenspan, MD sent at 11/05/2021  8:26 PM EDT ----- Potassium is mildly high  Your cardiologist may want to change your losartan dose / please keep the follow up appt with them (please send copy of labs to cardiology) Stable kidney numbers  Glucose is high at 236 (this could cause some nausea if it stays high Liver tests are stable but protein levels are low , I want to check a urine test for protein and uti at your next follow up Other labs are ok  Try the generic pepcid and we will see if that helps nausea

## 2021-11-08 NOTE — Telephone Encounter (Signed)
I have attempted without success to contact this patient by phone to discuss lab results and I left a message on answering machine.

## 2021-11-16 DIAGNOSIS — M7989 Other specified soft tissue disorders: Secondary | ICD-10-CM | POA: Diagnosis not present

## 2021-11-16 DIAGNOSIS — Z95810 Presence of automatic (implantable) cardiac defibrillator: Secondary | ICD-10-CM | POA: Diagnosis not present

## 2021-11-16 DIAGNOSIS — R600 Localized edema: Secondary | ICD-10-CM | POA: Diagnosis not present

## 2021-11-16 DIAGNOSIS — R9431 Abnormal electrocardiogram [ECG] [EKG]: Secondary | ICD-10-CM | POA: Diagnosis not present

## 2021-11-16 DIAGNOSIS — R519 Headache, unspecified: Secondary | ICD-10-CM | POA: Diagnosis not present

## 2021-11-16 DIAGNOSIS — I428 Other cardiomyopathies: Secondary | ICD-10-CM | POA: Diagnosis not present

## 2021-11-16 DIAGNOSIS — I10 Essential (primary) hypertension: Secondary | ICD-10-CM | POA: Diagnosis not present

## 2021-11-17 DIAGNOSIS — I10 Essential (primary) hypertension: Secondary | ICD-10-CM | POA: Diagnosis not present

## 2021-11-19 ENCOUNTER — Telehealth: Payer: Self-pay

## 2021-11-19 DIAGNOSIS — R079 Chest pain, unspecified: Secondary | ICD-10-CM | POA: Diagnosis not present

## 2021-11-19 DIAGNOSIS — Z905 Acquired absence of kidney: Secondary | ICD-10-CM | POA: Diagnosis not present

## 2021-11-19 DIAGNOSIS — Z885 Allergy status to narcotic agent status: Secondary | ICD-10-CM | POA: Diagnosis not present

## 2021-11-19 DIAGNOSIS — Z886 Allergy status to analgesic agent status: Secondary | ICD-10-CM | POA: Diagnosis not present

## 2021-11-19 DIAGNOSIS — R5383 Other fatigue: Secondary | ICD-10-CM | POA: Diagnosis not present

## 2021-11-19 DIAGNOSIS — N170 Acute kidney failure with tubular necrosis: Secondary | ICD-10-CM | POA: Diagnosis not present

## 2021-11-19 DIAGNOSIS — N1831 Chronic kidney disease, stage 3a: Secondary | ICD-10-CM | POA: Diagnosis not present

## 2021-11-19 DIAGNOSIS — E8809 Other disorders of plasma-protein metabolism, not elsewhere classified: Secondary | ICD-10-CM | POA: Diagnosis not present

## 2021-11-19 DIAGNOSIS — N179 Acute kidney failure, unspecified: Secondary | ICD-10-CM | POA: Diagnosis not present

## 2021-11-19 DIAGNOSIS — R638 Other symptoms and signs concerning food and fluid intake: Secondary | ICD-10-CM | POA: Diagnosis not present

## 2021-11-19 DIAGNOSIS — R1310 Dysphagia, unspecified: Secondary | ICD-10-CM | POA: Diagnosis not present

## 2021-11-19 DIAGNOSIS — R110 Nausea: Secondary | ICD-10-CM | POA: Diagnosis not present

## 2021-11-19 DIAGNOSIS — I428 Other cardiomyopathies: Secondary | ICD-10-CM | POA: Diagnosis not present

## 2021-11-19 DIAGNOSIS — Z853 Personal history of malignant neoplasm of breast: Secondary | ICD-10-CM | POA: Diagnosis not present

## 2021-11-19 DIAGNOSIS — N189 Chronic kidney disease, unspecified: Secondary | ICD-10-CM | POA: Diagnosis not present

## 2021-11-19 DIAGNOSIS — K225 Diverticulum of esophagus, acquired: Secondary | ICD-10-CM | POA: Diagnosis not present

## 2021-11-19 DIAGNOSIS — E1122 Type 2 diabetes mellitus with diabetic chronic kidney disease: Secondary | ICD-10-CM | POA: Diagnosis not present

## 2021-11-19 DIAGNOSIS — E861 Hypovolemia: Secondary | ICD-10-CM | POA: Diagnosis not present

## 2021-11-19 DIAGNOSIS — R4789 Other speech disturbances: Secondary | ICD-10-CM | POA: Diagnosis not present

## 2021-11-19 DIAGNOSIS — I959 Hypotension, unspecified: Secondary | ICD-10-CM | POA: Diagnosis not present

## 2021-11-19 DIAGNOSIS — G9389 Other specified disorders of brain: Secondary | ICD-10-CM | POA: Diagnosis not present

## 2021-11-19 DIAGNOSIS — I5032 Chronic diastolic (congestive) heart failure: Secondary | ICD-10-CM | POA: Diagnosis not present

## 2021-11-19 DIAGNOSIS — I447 Left bundle-branch block, unspecified: Secondary | ICD-10-CM | POA: Diagnosis not present

## 2021-11-19 DIAGNOSIS — I700 Atherosclerosis of aorta: Secondary | ICD-10-CM | POA: Diagnosis not present

## 2021-11-19 DIAGNOSIS — M810 Age-related osteoporosis without current pathological fracture: Secondary | ICD-10-CM | POA: Diagnosis not present

## 2021-11-19 DIAGNOSIS — E1142 Type 2 diabetes mellitus with diabetic polyneuropathy: Secondary | ICD-10-CM | POA: Diagnosis not present

## 2021-11-19 DIAGNOSIS — Z8673 Personal history of transient ischemic attack (TIA), and cerebral infarction without residual deficits: Secondary | ICD-10-CM | POA: Diagnosis not present

## 2021-11-19 DIAGNOSIS — I130 Hypertensive heart and chronic kidney disease with heart failure and stage 1 through stage 4 chronic kidney disease, or unspecified chronic kidney disease: Secondary | ICD-10-CM | POA: Diagnosis not present

## 2021-11-19 DIAGNOSIS — R627 Adult failure to thrive: Secondary | ICD-10-CM | POA: Diagnosis not present

## 2021-11-19 DIAGNOSIS — E785 Hyperlipidemia, unspecified: Secondary | ICD-10-CM | POA: Diagnosis not present

## 2021-11-19 DIAGNOSIS — K219 Gastro-esophageal reflux disease without esophagitis: Secondary | ICD-10-CM | POA: Diagnosis not present

## 2021-11-19 DIAGNOSIS — I10 Essential (primary) hypertension: Secondary | ICD-10-CM | POA: Diagnosis not present

## 2021-11-19 DIAGNOSIS — R109 Unspecified abdominal pain: Secondary | ICD-10-CM | POA: Diagnosis not present

## 2021-11-19 DIAGNOSIS — Z888 Allergy status to other drugs, medicaments and biological substances status: Secondary | ICD-10-CM | POA: Diagnosis not present

## 2021-11-19 DIAGNOSIS — E875 Hyperkalemia: Secondary | ICD-10-CM | POA: Diagnosis not present

## 2021-11-19 DIAGNOSIS — E11649 Type 2 diabetes mellitus with hypoglycemia without coma: Secondary | ICD-10-CM | POA: Diagnosis not present

## 2021-11-19 DIAGNOSIS — E162 Hypoglycemia, unspecified: Secondary | ICD-10-CM | POA: Diagnosis not present

## 2021-11-19 DIAGNOSIS — R0602 Shortness of breath: Secondary | ICD-10-CM | POA: Diagnosis not present

## 2021-11-19 NOTE — Telephone Encounter (Signed)
Selinda Flavin answered the phone (not on DPR) pt and her daughter are in the triage room at [facility] ED now. Selinda Flavin said for couple of days pt has been sleepy, tired with slurred speech and some confusion; pts BP has been going up and down. Sending note to Dr [name].    [place] [facility] - Client TELEPHONE ADVICE RECORD [facility] Patient Name: Morgan KARPOWICZ MS Gender: Female DOB: 1935/05/01 Age: 87 Y 11 M 11 D Return Phone Number: [phone] (Primary), [phone] (Secondary) Address: City/ State/ Zip: [place] [address] Client [facility] - Client Client Site [place] Provider [name], Roque Lias - MD Contact Type Call Who Is Calling Patient / Member / Family / Caregiver Call Type Triage / Clinical Relationship To Patient Self Return Phone Number [phone] (Secondary) Chief Complaint BREATHING - shortness of breath or sounds breathless Reason for Call Symptomatic / Request for [place] states she has a patient on the line who just got out of the ER last night for high blood pressure. She says her kids are noticing her speech is slurred and she does have a headache. She has had the headache for a while and her blood pressure have been fluctuating. She is also having some shortness of breath. She has no appetite and just doesn't feel good. Translation No Nurse Assessment Nurse: Dawna Part, RN, Jarrett Soho Date/Time (Eastern Time): 11/19/2021 1:11:09 PM Confirm and document reason for call. If symptomatic, describe symptoms. ---Caller reports she was discharged from ED last night for high blood pressure. Reports having headache rates pain 5/10. Family member reports patient speech is slurred. Does the patient have any new or worsening symptoms? ---Yes Will a triage be completed? ---Yes Related visit to physician within the last 2 weeks? ---Yes Does the PT have any chronic conditions?  (i.e. diabetes, asthma, this includes High risk factors for pregnancy, etc.) ---Yes List chronic conditions. ---HTN, cancer, ICD, DM Is this a behavioral health or substance abuse call? ---No Guidelines Guideline Title Affirmed Question Affirmed Notes Nurse Date/Time (Eastern Time) Blood Pressure - High [6] Systolic BP >= 010 OR Diastolic >= 932 AND [3] cardiac (e.g., breathing Riddle, RN, Jarrett Soho 11/19/2021 1:15:46 PM PLEASE NOTE: All timestamps contained within this report are represented as [place] Standard Time. CONFIDENTIALTY NOTICE: This fax transmission is intended only for the addressee. It contains information that is legally privileged, confidential or otherwise protected from use or disclosure. If you are not the intended recipient, you are strictly prohibited from reviewing, disclosing, copying using or disseminating any of this information or taking any action in reliance on or regarding this information. If you have received this fax in error, please notify us immediately by telephone so that we can arrange for its return to [place]. Phone: [phone], Toll-Free: [phone], Fax: [phone] Page: 2 of 2 Call Id: [id number] Guidelines Guideline Title Affirmed Question Affirmed Notes Nurse Date/Time Eilene Ghazi Time) difficulty, chest pain) or neurologic symptoms (e.g., new-onset blurred or double vision, unsteady gait) Disp. Time Eilene Ghazi Time) Disposition Final User 11/19/2021 1:09:43 PM Send to Urgent Lyda Jester 11/19/2021 1:18:56 PM Go to ED Now Yes Dawna Part, RN, Hayes Ludwig Disagree/Comply Comply Caller Understands Yes PreDisposition Call Doctor Care Advice Given Per Guideline GO TO ED NOW: * You need to be seen in the Emergency Department. * Go to the ED at ___________ [place] now. Drive carefully. CARE ADVICE given per High Blood Pressure (Adult) guideline. * Another adult  should drive. NOTE TO TRIAGER - DRIVING: Comments User: Tedd Sias, RN Date/Time Eilene Ghazi Time): 11/19/2021 1:16:00 PM Current blood pressure is 180/79 HR [place] - E

## 2021-11-19 NOTE — Telephone Encounter (Signed)
Agree with ER visit, glad she is there now since slurring is concerning.   Will watch for correspondence (was recently seen there for HTN urgency as well)

## 2021-11-19 NOTE — Telephone Encounter (Signed)
Patient went to see one of her specialists on Friday and when in there her blood pressure was high, they recommended her to go to ED. Patient complied and was kept overnight for observation, she released at midnight today. Kelsay called in saying that her kids noticed she has slurred speech and she did report she was having more frequent headaches. Did have patient speak with access nurse, [place].

## 2021-11-19 NOTE — Telephone Encounter (Signed)
Per Dr. [name] send to rena

## 2021-11-28 ENCOUNTER — Encounter: Payer: Self-pay | Admitting: Family Medicine

## 2021-11-28 ENCOUNTER — Ambulatory Visit (INDEPENDENT_AMBULATORY_CARE_PROVIDER_SITE_OTHER): Payer: Medicare HMO | Admitting: Family Medicine

## 2021-11-28 VITALS — BP 160/64 | HR 63 | Wt 136.8 lb

## 2021-11-28 DIAGNOSIS — E1129 Type 2 diabetes mellitus with other diabetic kidney complication: Secondary | ICD-10-CM

## 2021-11-28 DIAGNOSIS — N183 Chronic kidney disease, stage 3 unspecified: Secondary | ICD-10-CM | POA: Diagnosis not present

## 2021-11-28 DIAGNOSIS — R531 Weakness: Secondary | ICD-10-CM | POA: Diagnosis not present

## 2021-11-28 DIAGNOSIS — I5022 Chronic systolic (congestive) heart failure: Secondary | ICD-10-CM | POA: Diagnosis not present

## 2021-11-28 DIAGNOSIS — K219 Gastro-esophageal reflux disease without esophagitis: Secondary | ICD-10-CM | POA: Diagnosis not present

## 2021-11-28 DIAGNOSIS — I10 Essential (primary) hypertension: Secondary | ICD-10-CM

## 2021-11-28 DIAGNOSIS — N289 Disorder of kidney and ureter, unspecified: Secondary | ICD-10-CM

## 2021-11-28 DIAGNOSIS — Z79899 Other long term (current) drug therapy: Secondary | ICD-10-CM | POA: Diagnosis not present

## 2021-11-28 DIAGNOSIS — I428 Other cardiomyopathies: Secondary | ICD-10-CM

## 2021-11-28 NOTE — Assessment & Plan Note (Signed)
BP: (!) 160/64    Recent hosp for hypertensive urgency and dehydration from GI source, now much improved Reviewed hospital records, lab results and studies in detail  Lab today  If AKI is improved will be able to start back losartan hctz  Continues  imdur 60 mg daily  Coreg 12.5 mg bid  Hydralazine 50 mg bid  Feeling much better  Lab today  Has f/u with nephrology planned as well

## 2021-11-28 NOTE — Assessment & Plan Note (Signed)
Recent hosp for dehydration and AKI Now home and the PT referral did not work out  [employer] ref  Will not be able to start for 2 wk due to travel

## 2021-11-28 NOTE — Assessment & Plan Note (Signed)
Stable  Recent hosp for dehydration  Reviewed hospital records, lab results and studies in detail   For f/u with Dr Rockey Situ in july

## 2021-11-28 NOTE — Assessment & Plan Note (Signed)
Recent AKI from dehydration /decreased po intake  Reviewed hospital records, lab results and studies in detail  Clinical improvement today  Planning nephrology f/u  Lab today  If improved will add back metformin and losartan hct

## 2021-11-28 NOTE — Assessment & Plan Note (Signed)
Reviewed meds with recent hosp changes Ref to clinical pharmacy for help  Pt appreciated this

## 2021-11-28 NOTE — Assessment & Plan Note (Signed)
Out of control prior-caused her to stop food/fluids and meds  hosp for inc bp and dehydration  Reviewed hospital records, lab results and studies in detail  Now much improved with pepcid 20 mg bid and protonix 40 mg daily

## 2021-11-28 NOTE — Patient Instructions (Addendum)
Continue holding your metformin and losartan hct until we get your labs back    Keep drinking fluids   Continue other medications  We will let you know what to do when labs return   I will place home health referral - and tell them to hold off 2 weeks   I will also place a referral to our pharmacist for a review of medications

## 2021-11-28 NOTE — Assessment & Plan Note (Signed)
Holding metformin from prev hosp for AKI Lab today, may re start  Not taking farxiga due to cost  Plans to f/u with her endocrinologist  No hypoglycemia

## 2021-11-28 NOTE — Assessment & Plan Note (Signed)
For f/u July  No sob or edema  Carefully increasing fluids after dehydration  Reviewed hospital records, lab results and studies in detail   Renal labs today  If improved will start back losartan hctz

## 2021-11-28 NOTE — Progress Notes (Signed)
Subjective:    Patient ID: Morgan Hubbard, female    DOB: Apr 28, 1935, 87 y.o.   MRN: [id number]  HPI Pt presents for f/u of hosp  Wt Readings from Last 3 Encounters:  11/28/21 136 lb 12.8 oz (62.1 kg)  11/05/21 142 lb (64.4 kg)  09/19/21 139 lb 1 oz (63.1 kg)   25.85 kg/m   Was hosp from 6/19 to 6/24 for sob, chest pain with AKI She was adm from the ER after presentation with very elevated BP Noted fatigue and low appetite   Hosp course  Hospital Course by Problem: #GERD #Nausea & Vomiting #Zenker Diverticulum [name] presented with decreased PO intake, nausea and regurgitation of food over a period of months that rapidly worsened prior to admission. Her symptoms limited her ability to tolerate PO medications and resulted in significant hypertension and hypoglycemia. In house, GI was consulted and anti-acid therapy resumed. She underwent barium swallow, which revealed a small Zenker diverticulum, and gastric emptying study which was normal. Her symptoms improved with anti-acid medication and EGD deferred at this time. She has persistent early satiety and lack of appetite on discharge but is tolerating full meals and maintaining input to meet her nutritional needs.   Now taking pepcid 20 mg bid Protonix 40 mg daily    #Acute Kidney Injury #Chronic Kidney Disease of single kidney Morgan Hubbard presented with a mild AKI which was likely multifactorial due to hypovolemia from poor PO intake and ATN from hypotension. Her creatinine improved with volume resuscitation but remained above baseline at discharge. This likely reflects some ATN and we expect this to improve. PCP to follow up for improvement.   Sees nephrologist soon   #Hypertension #Non-ischemic Cardiomyopathy  #Heart Failure with recovered EF  #Biventricular implantable CRT-D in situ  Morgan Hubbard has had uncontrolled blood pressure outpatient due to inability to tolerate PO medicines. In house, we resumed her Imdur '60mg'$  qD,  increased Coreg to '25mg'$  BID and continued '50mg'$  BID. We held her losartan-HCTZ due to her renal   Holding losartan hctz   Trying to drink fluids More water  Some ensure as well   #Type 2 Diabetes Mellitus c/b peripheral neuropathy  Metformin held on admission due to AKI and BG managed with sliding scale insulin in house. On discharge, she will hold metformin until AKI resolves and Farxiga removed from her medication list as she reports she cannot afford it. We continued her linagliptin given good PO in take here and no hypoglycemia but consider alternate agents if hypoglycemia develops outpatient.   #History of CVA (2015)  History of CVA in 2015 with no residual deficits. She was transitioned to Plavix and aspirin by her cardiologist and has not followed with neurology since 2016. No further CVA or TIA and given this we transitioned her to aspirin '81mg'$  monotherapy.  #Urinary Spasms  Continued on home [name] '50mg'$  qD.   #History of breast cancer  Continued on home letrozole 2.'5mg'$  qD   Cardiology f/u with Dr Rockey Situ is on 7/25   She was sent home with PT but did not get it started / ? Scheduling conflict  Wants to get that set up for when she [place] from the beach  She struggles with balance  Uses cane No falls since coming home  Husband helps a lot - put extra railings in the house   Patient Active Problem List   Diagnosis Date Noted   Generalized weakness 11/28/2021   Encounter for medication review 11/28/2021  Nausea 11/05/2021   Shoulder pain 04/25/2021   History of breast cancer 06/18/2020   Anemia 06/16/2020   Elevated TSH 06/16/2020   Occipital neuralgia 02/14/2020   Hair loss 11/09/2018   Leg weakness, bilateral 06/29/2018   Pedal edema 06/22/2018   AICD (automatic cardioverter/defibrillator) present 06/11/2018   GERD (gastroesophageal reflux disease) 06/10/2018   Cholecystitis 06/10/2018   Tingling 10/06/2017   Chronic back pain 50/93/2671   Chronic systolic  (congestive) heart failure ([place]) 02/25/2017   Osteopenia 06/16/2016   Routine general medical examination at a health care facility 04/24/2016   Estrogen deficiency 04/24/2016   Urticaria 11/03/2015   Encounter for Medicare annual wellness exam 06/13/2014   Stroke, small vessel ([place]) 01/18/2014   Family history of hemochromatosis 01/18/2014   Cerebral thrombosis with cerebral infarction ([place]) 01/14/2014   Expressive aphasia 01/13/2014   Nonischemic cardiomyopathy ([place]) 04/05/2013   Shortness of breath 09/03/2012   Elevated transaminase level 05/18/2012   Spinal stenosis of lumbar region 12/10/2011   Mixed incontinence urge and stress 12/10/2011   Renal insufficiency 11/04/2011   Goiter 01/14/2011   Fatty liver 08/28/2010   Type II diabetes mellitus with renal manifestations ([place]) 07/09/2010   Hyperlipidemia associated with type 2 diabetes mellitus ([place]) 07/09/2010   Hyperkalemia 07/09/2010   REFLEX SYMPATHETIC DYSTROPHY 07/09/2010   Neuropathy 07/09/2010   Essential hypertension 07/09/2010   CARDIOMYOPATHY 07/09/2010   OVERACTIVE BLADDER 07/09/2010   Past Medical History:  Diagnosis Date   Arthritis    "hands" (01/13/2014)   Basal cell carcinoma 01/2014   "bridge of nose"   Cardiac LV ejection fraction >40%    "it was 43 last year" (01/13/2014)   Chronic lower back pain    CKD (chronic kidney disease), stage III (HCC)    acute on chronic stage III/notes 06/10/2018   Colon polyps    Expressive aphasia    "3 times in the last week" (01/13/2014)   GERD (gastroesophageal reflux disease)    Goiter past remote   treated with RI   Hyperlipidemia    Hyperpotassemia    Hypertension    Hypertonicity of bladder    Inflammatory and toxic neuropathy, unspecified    LBBB (left bundle branch block)    Migraine    "stopped many years ago" (01/13/2014)   Mini stroke 01/2014   Other primary cardiomyopathies    Overactive bladder    Presence of combination internal cardiac defibrillator  (ICD) and pacemaker    Reflex sympathetic dystrophy, unspecified    Shingles    Shortness of breath    ambulation   Stroke ([place])    Thyroid disease    Type II diabetes mellitus ([place])    Ulcer    Urine incontinence    Vertigo    hx of   Past Surgical History:  Procedure Laterality Date   BACK SURGERY     BI-VENTRICULAR PACEMAKER INSERTION (CRT-P)  10/2014   [facility]   BREAST CYST EXCISION Left 1959   CARDIAC CATHETERIZATION  "several"   [place]   CATARACT EXTRACTION W/ INTRAOCULAR LENS IMPLANT Left ~ 2005   several eye injections   CATARACT EXTRACTION W/PHACO Right 05/04/2020   Procedure: CATARACT EXTRACTION PHACO AND INTRAOCULAR LENS PLACEMENT ([place]) RIGHT DIABETIC;  Surgeon: Birder Robson, MD;  Location: [facility];  Service: Ophthalmology;  Laterality: Right;  [place] 00:58.5 CDE 6.70 Fluid Pack [place] # [id number]   CHOLECYSTECTOMY N/A 06/13/2018   Procedure: LAPAROSCOPIC CHOLECYSTECTOMY;  Surgeon: Donnie Mesa, MD;  Location: [place];  Service: General;  Laterality: N/A;   COLONOSCOPY  7/12   normal (hx of polyps in past)    CT SCAN  3/12   outside hosp- lung nodule and gallstones   ERCP N/A 06/12/2018   Procedure: ENDOSCOPIC RETROGRADE CHOLANGIOPANCREATOGRAPHY (ERCP);  Surgeon: Carol Ada, MD;  Location: [place];  Service: Endoscopy;  Laterality: N/A;   [place]   "knot on index"   KIDNEY DONATION Left 1989   LUMBAR LAMINECTOMY/DECOMPRESSION MICRODISCECTOMY  1999   LUMBAR LAMINECTOMY/DECOMPRESSION MICRODISCECTOMY  01/31/2012   Procedure: LUMBAR LAMINECTOMY/DECOMPRESSION MICRODISCECTOMY 2 LEVELS;  Surgeon: Eustace Moore, MD;  Location: [facility] [facility];  Service: Neurosurgery;  Laterality: N/A;  Thoracic twelve-lumbar one, lumbar one-two laminectomy    POSTERIOR LAMINECTOMY / DECOMPRESSION CERVICAL SPINE  1999   REMOVAL OF STONES  06/12/2018   Procedure: REMOVAL OF STONES;  Surgeon: Carol Ada, MD;  Location: [place];  Service: Endoscopy;;    SPHINCTEROTOMY  06/12/2018   Procedure: Joan Mayans;  Surgeon: Carol Ada, MD;  Location[facility];  Service: Endoscopy;;   TUBAL LIGATION  1976   UPPER GASTROINTESTINAL ENDOSCOPY     Social History   Tobacco Use   Smoking status: Never   Smokeless tobacco: Never  Vaping Use   Vaping Use: Never used  Substance Use Topics   Alcohol use: No    Alcohol/week: 0.0 standard drinks of alcohol   Drug use: No   Family History  Problem Relation Age of Onset   Arthritis Mother    Cancer Mother        uterine and mouth   Hyperlipidemia Mother    Stroke Mother    Hypertension Mother    Alcohol abuse Father    Diabetes Father    Cancer Sister        breast   Diabetes Sister    Cancer Brother        lung cancer   Kidney disease Brother    Diabetes Brother    Hyperlipidemia Sister    Heart disease Sister    Hypertension Sister    Diabetes Sister    Hyperlipidemia Brother    Kidney failure Brother    Diabetes Daughter    Addison's disease Other    Allergies  Allergen Reactions   Ace Inhibitors Swelling    REACTION: tongue swelling   Codeine Nausea Only   Hydrochlorothiazide Other (See Comments)    Dizziness/funny feeling   Insulin Detemir Itching   Lisinopril Swelling    Swelling of tongue   Nsaids Other (See Comments)    Renal issues   Tylenol [Acetaminophen] Other (See Comments)    Fatty liver   Current Outpatient Medications on File Prior to Visit  Medication Sig Dispense Refill   aspirin EC 81 MG tablet Take 1 tablet (81 mg total) by mouth daily. Swallow whole. 90 tablet 3   atorvastatin (LIPITOR) 80 MG tablet Take by mouth.     carvedilol (COREG) 12.5 MG tablet Take 1 tablet (12.5 mg total) by mouth 2 (two) times daily with a meal. 180 tablet 3   cetirizine (ZYRTEC) 10 MG tablet Take 10 mg by mouth daily.     famotidine (PEPCID) 20 MG tablet Take 1 tablet (20 mg total) by mouth 2 (two) times daily. 60 tablet 1   gabapentin (NEURONTIN) 300 MG capsule Take  1 capsule (300 mg total) by mouth 3 (three) times daily. 270 capsule 3   glucose blood test strip Use 3 (three) times daily.  hydrALAZINE (APRESOLINE) 50 MG tablet Take 1 tablet (50 mg total) by mouth 2 (two) times daily. Take an extra hydralazine 50 mg as needed for pressure >170 100 tablet 3   isosorbide mononitrate (IMDUR) 60 MG 24 hr tablet Take 1 tablet (60 mg total) by mouth in the morning and at bedtime. 90 tablet 3   letrozole (FEMARA) 2.5 MG tablet Take 2.5 mg by mouth daily.     linagliptin (TRADJENTA) 5 MG TABS tablet Take by mouth.     mirabegron ER ([name]) 50 MG TB24 tablet Take 1 tablet (50 mg total) by mouth at bedtime. 90 tablet 3   nystatin cream (MYCOSTATIN) Apply 1 application topically 2 (two) times daily as needed (irritation.).      pantoprazole (PROTONIX) 40 MG tablet Take 1 tablet by mouth once daily 90 tablet 1   traMADol (ULTRAM) 50 MG tablet TAKE 1 TO 2 TABLETS BY MOUTH EVERY 8 HOURS AS NEEDED FOR MODERATE TO SEVERE PAIN 60 tablet 0   TRULICITY 4.69 GE/9.5MW SOPN SMARTSIG:1 unspecified SUB-Q Once a Week     vitamin B-12 (CYANOCOBALAMIN) 1000 MCG tablet Take 1,000 mcg by mouth daily.      Vitamin D, Ergocalciferol, (DRISDOL) 1.25 MG (50000 UT) CAPS capsule Take 50,000 Units by mouth every Monday.      clotrimazole-betamethasone (LOTRISONE) cream Apply 1 application topically as needed (groin).      glimepiride (AMARYL) 2 MG tablet Take 2 mg by mouth daily.      losartan-hydrochlorothiazide (HYZAAR) 100-12.5 MG tablet Take 1 tablet by mouth once daily (Patient not taking: Reported on 11/28/2021) 90 tablet 0   metFORMIN (GLUCOPHAGE) 1000 MG tablet Take 1,000 mg by mouth in the morning and at bedtime.  (Patient not taking: Reported on 11/28/2021)     No current facility-administered medications on file prior to visit.    Review of Systems  Constitutional:  Positive for fatigue. Negative for activity change, appetite change, fever and unexpected weight change.  HENT:   Negative for congestion, ear pain, rhinorrhea, sinus pressure and sore throat.   Eyes:  Negative for pain, redness and visual disturbance.  Respiratory:  Negative for cough, shortness of breath and wheezing.   Cardiovascular:  Negative for chest pain and palpitations.       Slight ankle swelling if any  Gastrointestinal:  Negative for abdominal pain, blood in stool, constipation and diarrhea.  Endocrine: Negative for polydipsia and polyuria.  Genitourinary:  Negative for dysuria, frequency and urgency.  Musculoskeletal:  Negative for arthralgias, back pain and myalgias.  Skin:  Negative for pallor and rash.  Allergic/Immunologic: Negative for environmental allergies.  Neurological:  Positive for weakness. Negative for dizziness, syncope, facial asymmetry, light-headedness, numbness and headaches.       Poor balance   Hematological:  Negative for adenopathy. Does not bruise/bleed easily.  Psychiatric/Behavioral:  Negative for decreased concentration and dysphoric mood. The patient is not nervous/anxious.        Objective:   Physical Exam Constitutional:      General: She is not in acute distress.    Appearance: Normal appearance. She is well-developed and normal weight. She is not ill-appearing or diaphoretic.  HENT:     Head: Normocephalic and atraumatic.     Right Ear: External ear normal.     Left Ear: External ear normal.     Nose: Nose normal.     Mouth/Throat:     Pharynx: Oropharynx is clear.     Comments: MM are slightly dry  Eyes:     General: No scleral icterus.       Right eye: No discharge.        Left eye: No discharge.     Conjunctiva/sclera: Conjunctivae normal.     Pupils: Pupils are equal, round, and reactive to light.  Neck:     Thyroid: No thyromegaly.     Vascular: No carotid bruit or JVD.  Cardiovascular:     Rate and Rhythm: Normal rate and regular rhythm.     Heart sounds: Murmur heard.     No gallop.  Pulmonary:     Effort: Pulmonary effort is  normal. No respiratory distress.     Breath sounds: Normal breath sounds. No stridor. No wheezing, rhonchi or rales.     Comments: No crackles Abdominal:     General: Bowel sounds are normal. There is no distension.     Palpations: Abdomen is soft. There is no mass.     Tenderness: There is no abdominal tenderness.  Musculoskeletal:        General: No tenderness.     Cervical back: Normal range of motion and neck supple.     Comments: Trace ankle edema   Lymphadenopathy:     Cervical: No cervical adenopathy.  Skin:    General: Skin is warm and dry.     Coloration: Skin is not pale.     Findings: No erythema or rash.  Neurological:     Mental Status: She is alert.     Cranial Nerves: No cranial nerve deficit.     Motor: No abnormal muscle tone.     Coordination: Coordination normal.     Deep Tendon Reflexes: Reflexes are normal and symmetric. Reflexes normal.     Comments: Requires asst to get up from chair  Walks with cane  Balance is fair   Psychiatric:        Mood and Affect: Mood normal.           Assessment & Plan:   Problem List Items Addressed This Visit       Cardiovascular and Mediastinum   Chronic systolic (congestive) heart failure (HCC)    Stable  Recent hosp for dehydration  Reviewed hospital records, lab results and studies in detail   For f/u with Dr Rockey Situ in july      Relevant Medications   atorvastatin (LIPITOR) 80 MG tablet   Essential hypertension - Primary    BP: (!) 160/64    Recent hosp for hypertensive urgency and dehydration from GI source, now much improved Reviewed hospital records, lab results and studies in detail  Lab today  If AKI is improved will be able to start back losartan hctz  Continues  imdur 60 mg daily  Coreg 12.5 mg bid  Hydralazine 50 mg bid  Feeling much better  Lab today  Has f/u with nephrology planned as well      Relevant Medications   atorvastatin (LIPITOR) 80 MG tablet   Other Relevant Orders   TSH    Comprehensive metabolic panel   CBC with Differential/Platelet   AMB Referral to [facility]   Nonischemic cardiomyopathy [facility])    For f/u July  No sob or edema  Carefully increasing fluids after dehydration  Reviewed hospital records, lab results and studies in detail   Renal labs today  If improved will start back losartan hctz       Relevant Medications   atorvastatin (LIPITOR) 80 MG tablet   Other Relevant  Orders   AMB Referral to [facility]     Digestive   GERD (gastroesophageal reflux disease)    Out of control prior-caused her to stop food/fluids and meds  hosp for inc bp and dehydration  Reviewed hospital records, lab results and studies in detail  Now much improved with pepcid 20 mg bid and protonix 40 mg daily          Endocrine   Type II diabetes mellitus with renal manifestations ([place])    Holding metformin from prev hosp for AKI Lab today, may re start  Not taking farxiga due to cost  Plans to f/u with her endocrinologist  No hypoglycemia       Relevant Medications   atorvastatin (LIPITOR) 80 MG tablet   linagliptin (TRADJENTA) 5 MG TABS tablet   Other Relevant Orders   AMB Referral to [place]     Genitourinary   Renal insufficiency    Recent AKI from dehydration /decreased po intake  Reviewed hospital records, lab results and studies in detail  Clinical improvement today  Planning nephrology f/u  Lab today  If improved will add back metformin and losartan hct       Relevant Orders   Comprehensive metabolic panel   AMB Referral to [place]     Other   Encounter for medication review    Reviewed meds with recent hosp changes Ref to clinical pharmacy for help  Pt appreciated this       Relevant Orders   [facility]   Generalized weakness    Recent hosp for dehydration and AKI Now home and the PT referral did not work out  [employer] ref   Will not be able to start for 2 wk due to travel      Relevant Orders   Ambulatory referral to [facility]

## 2021-11-29 ENCOUNTER — Observation Stay (HOSPITAL_COMMUNITY)
Admission: EM | Admit: 2021-11-29 | Discharge: 2021-11-30 | Disposition: A | Discharge status: Home / Self Care | Payer: Medicare HMO | Attending: Internal Medicine | Admitting: Internal Medicine

## 2021-11-29 ENCOUNTER — Telehealth: Payer: Self-pay | Admitting: Family Medicine

## 2021-11-29 ENCOUNTER — Other Ambulatory Visit: Payer: Self-pay

## 2021-11-29 ENCOUNTER — Encounter (HOSPITAL_COMMUNITY): Payer: Self-pay | Admitting: Emergency Medicine

## 2021-11-29 ENCOUNTER — Emergency Department (HOSPITAL_COMMUNITY): Payer: Medicare HMO

## 2021-11-29 DIAGNOSIS — E1165 Type 2 diabetes mellitus with hyperglycemia: Secondary | ICD-10-CM | POA: Diagnosis not present

## 2021-11-29 DIAGNOSIS — I083 Combined rheumatic disorders of mitral, aortic and tricuspid valves: Secondary | ICD-10-CM | POA: Insufficient documentation

## 2021-11-29 DIAGNOSIS — Z823 Family history of stroke: Secondary | ICD-10-CM | POA: Insufficient documentation

## 2021-11-29 DIAGNOSIS — Z8261 Family history of arthritis: Secondary | ICD-10-CM | POA: Insufficient documentation

## 2021-11-29 DIAGNOSIS — R739 Hyperglycemia, unspecified: Secondary | ICD-10-CM

## 2021-11-29 DIAGNOSIS — K225 Diverticulum of esophagus, acquired: Secondary | ICD-10-CM | POA: Insufficient documentation

## 2021-11-29 DIAGNOSIS — E1129 Type 2 diabetes mellitus with other diabetic kidney complication: Secondary | ICD-10-CM

## 2021-11-29 DIAGNOSIS — Z79899 Other long term (current) drug therapy: Secondary | ICD-10-CM | POA: Insufficient documentation

## 2021-11-29 DIAGNOSIS — Z85828 Personal history of other malignant neoplasm of skin: Secondary | ICD-10-CM | POA: Diagnosis not present

## 2021-11-29 DIAGNOSIS — I428 Other cardiomyopathies: Secondary | ICD-10-CM | POA: Diagnosis not present

## 2021-11-29 DIAGNOSIS — N179 Acute kidney failure, unspecified: Secondary | ICD-10-CM | POA: Diagnosis not present

## 2021-11-29 DIAGNOSIS — I130 Hypertensive heart and chronic kidney disease with heart failure and stage 1 through stage 4 chronic kidney disease, or unspecified chronic kidney disease: Secondary | ICD-10-CM | POA: Insufficient documentation

## 2021-11-29 DIAGNOSIS — Z95810 Presence of automatic (implantable) cardiac defibrillator: Secondary | ICD-10-CM | POA: Diagnosis not present

## 2021-11-29 DIAGNOSIS — R55 Syncope and collapse: Secondary | ICD-10-CM

## 2021-11-29 DIAGNOSIS — I447 Left bundle-branch block, unspecified: Secondary | ICD-10-CM | POA: Insufficient documentation

## 2021-11-29 DIAGNOSIS — N184 Chronic kidney disease, stage 4 (severe): Secondary | ICD-10-CM | POA: Diagnosis not present

## 2021-11-29 DIAGNOSIS — I5022 Chronic systolic (congestive) heart failure: Secondary | ICD-10-CM | POA: Diagnosis not present

## 2021-11-29 DIAGNOSIS — E785 Hyperlipidemia, unspecified: Secondary | ICD-10-CM | POA: Insufficient documentation

## 2021-11-29 DIAGNOSIS — Z8673 Personal history of transient ischemic attack (TIA), and cerebral infarction without residual deficits: Secondary | ICD-10-CM | POA: Insufficient documentation

## 2021-11-29 DIAGNOSIS — M1990 Unspecified osteoarthritis, unspecified site: Secondary | ICD-10-CM | POA: Insufficient documentation

## 2021-11-29 DIAGNOSIS — Z8349 Family history of other endocrine, nutritional and metabolic diseases: Secondary | ICD-10-CM | POA: Insufficient documentation

## 2021-11-29 DIAGNOSIS — Z833 Family history of diabetes mellitus: Secondary | ICD-10-CM | POA: Insufficient documentation

## 2021-11-29 DIAGNOSIS — K219 Gastro-esophageal reflux disease without esophagitis: Secondary | ICD-10-CM | POA: Insufficient documentation

## 2021-11-29 DIAGNOSIS — R630 Anorexia: Secondary | ICD-10-CM | POA: Insufficient documentation

## 2021-11-29 DIAGNOSIS — R531 Weakness: Secondary | ICD-10-CM | POA: Diagnosis not present

## 2021-11-29 DIAGNOSIS — Z7982 Long term (current) use of aspirin: Secondary | ICD-10-CM | POA: Diagnosis not present

## 2021-11-29 DIAGNOSIS — Z8249 Family history of ischemic heart disease and other diseases of the circulatory system: Secondary | ICD-10-CM | POA: Insufficient documentation

## 2021-11-29 DIAGNOSIS — Z794 Long term (current) use of insulin: Secondary | ICD-10-CM | POA: Insufficient documentation

## 2021-11-29 DIAGNOSIS — I959 Hypotension, unspecified: Principal | ICD-10-CM | POA: Diagnosis present

## 2021-11-29 DIAGNOSIS — R2689 Other abnormalities of gait and mobility: Secondary | ICD-10-CM | POA: Insufficient documentation

## 2021-11-29 DIAGNOSIS — I9589 Other hypotension: Secondary | ICD-10-CM | POA: Diagnosis not present

## 2021-11-29 DIAGNOSIS — Z7985 Long-term (current) use of injectable non-insulin antidiabetic drugs: Secondary | ICD-10-CM | POA: Insufficient documentation

## 2021-11-29 DIAGNOSIS — Z7984 Long term (current) use of oral hypoglycemic drugs: Secondary | ICD-10-CM | POA: Diagnosis not present

## 2021-11-29 DIAGNOSIS — E1122 Type 2 diabetes mellitus with diabetic chronic kidney disease: Secondary | ICD-10-CM | POA: Insufficient documentation

## 2021-11-29 DIAGNOSIS — E861 Hypovolemia: Secondary | ICD-10-CM | POA: Diagnosis not present

## 2021-11-29 DIAGNOSIS — Z6825 Body mass index (BMI) 25.0-25.9, adult: Secondary | ICD-10-CM | POA: Insufficient documentation

## 2021-11-29 LAB — URINALYSIS, ROUTINE W REFLEX MICROSCOPIC
Bacteria, UA: NONE SEEN
Bilirubin Urine: NEGATIVE
Glucose, UA: >=500 mg/dL — AB
Hgb urine dipstick: NEGATIVE
Ketones, ur: NEGATIVE mg/dL
Leukocytes,Ua: NEGATIVE
Nitrite: NEGATIVE
Protein, ur: 100 mg/dL — AB
Specific Gravity, Urine: 1.013 (ref 1.005–1.030)
pH: 6 (ref 5.0–8.0)

## 2021-11-29 LAB — CBC WITH DIFFERENTIAL/PLATELET
Basophils Absolute: 0.1 10*3/uL (ref 0.0–0.1)
Basophils Relative: 1 % (ref 0.0–3.0)
Eosinophils Absolute: 0.3 10*3/uL (ref 0.0–0.7)
Eosinophils Relative: 5.3 % — ABNORMAL HIGH (ref 0.0–5.0)
HCT: 34.3 % — ABNORMAL LOW (ref 36.0–46.0)
Hemoglobin: 11.5 g/dL — ABNORMAL LOW (ref 12.0–15.0)
Lymphocytes Relative: 28.9 % (ref 12.0–46.0)
Lymphs Abs: 1.8 10*3/uL (ref 0.7–4.0)
MCHC: 33.5 g/dL (ref 30.0–36.0)
MCV: 91.6 fl (ref 78.0–100.0)
Monocytes Absolute: 0.4 10*3/uL (ref 0.1–1.0)
Monocytes Relative: 6.4 % (ref 3.0–12.0)
Neutro Abs: 3.6 10*3/uL (ref 1.4–7.7)
Neutrophils Relative %: 58.4 % (ref 43.0–77.0)
Platelets: 171 10*3/uL (ref 150.0–400.0)
RBC: 3.75 Mil/uL — ABNORMAL LOW (ref 3.87–5.11)
RDW: 13.6 % (ref 11.5–15.5)
WBC: 6.2 10*3/uL (ref 4.0–10.5)

## 2021-11-29 LAB — COMPREHENSIVE METABOLIC PANEL
ALT: 13 U/L (ref 0–35)
AST: 19 U/L (ref 0–37)
Albumin: 3.4 g/dL — ABNORMAL LOW (ref 3.5–5.2)
Alkaline Phosphatase: 66 U/L (ref 39–117)
BUN: 30 mg/dL — ABNORMAL HIGH (ref 6–23)
CO2: 31 mEq/L (ref 19–32)
Calcium: 8.8 mg/dL (ref 8.4–10.5)
Chloride: 100 mEq/L (ref 96–112)
Creatinine, Ser: 1.79 mg/dL — ABNORMAL HIGH (ref 0.40–1.20)
GFR: 25.64 mL/min — ABNORMAL LOW (ref >60.00)
Glucose, Bld: 399 mg/dL — ABNORMAL HIGH (ref 70–99)
Potassium: 5.3 mEq/L — ABNORMAL HIGH (ref 3.5–5.1)
Sodium: 137 mEq/L (ref 135–145)
Total Bilirubin: 0.4 mg/dL (ref 0.2–1.2)
Total Protein: 5.4 g/dL — ABNORMAL LOW (ref 6.0–8.3)

## 2021-11-29 LAB — GLUCOSE, CAPILLARY
Glucose-Capillary: 190 mg/dL — ABNORMAL HIGH (ref 70–99)
Glucose-Capillary: 195 mg/dL — ABNORMAL HIGH (ref 70–99)

## 2021-11-29 LAB — CBC
HCT: 32.5 % — ABNORMAL LOW (ref 36.0–46.0)
Hemoglobin: 10.6 g/dL — ABNORMAL LOW (ref 12.0–15.0)
MCH: 30.2 pg (ref 26.0–34.0)
MCHC: 32.6 g/dL (ref 30.0–36.0)
MCV: 92.6 fL (ref 80.0–100.0)
Platelets: 185 10*3/uL (ref 150–400)
RBC: 3.51 MIL/uL — ABNORMAL LOW (ref 3.87–5.11)
RDW: 12.9 % (ref 11.5–15.5)
WBC: 7.4 10*3/uL (ref 4.0–10.5)
nRBC: 0 % (ref 0.0–0.2)

## 2021-11-29 LAB — TROPONIN I (HIGH SENSITIVITY)
Troponin I (High Sensitivity): 9 ng/L (ref <18)
Troponin I (High Sensitivity): 9 ng/L (ref <18)

## 2021-11-29 LAB — BASIC METABOLIC PANEL
Anion gap: 10 (ref 5–15)
BUN: 28 mg/dL — ABNORMAL HIGH (ref 8–23)
CO2: 27 mmol/L (ref 22–32)
Calcium: 8.9 mg/dL (ref 8.9–10.3)
Chloride: 100 mmol/L (ref 98–111)
Creatinine, Ser: 1.96 mg/dL — ABNORMAL HIGH (ref 0.44–1.00)
GFR, Estimated: 25 mL/min — ABNORMAL LOW (ref >60)
Glucose, Bld: 287 mg/dL — ABNORMAL HIGH (ref 70–99)
Potassium: 4.7 mmol/L (ref 3.5–5.1)
Sodium: 137 mmol/L (ref 135–145)

## 2021-11-29 LAB — HEPATIC FUNCTION PANEL
ALT: 16 U/L (ref 0–44)
AST: 21 U/L (ref 15–41)
Albumin: 2.8 g/dL — ABNORMAL LOW (ref 3.5–5.0)
Alkaline Phosphatase: 69 U/L (ref 38–126)
Bilirubin, Direct: 0.1 mg/dL (ref 0.0–0.2)
Indirect Bilirubin: 0.9 mg/dL (ref 0.3–0.9)
Total Bilirubin: 1 mg/dL (ref 0.3–1.2)
Total Protein: 5.3 g/dL — ABNORMAL LOW (ref 6.5–8.1)

## 2021-11-29 LAB — LACTIC ACID, PLASMA
Lactic Acid, Venous: 2.1 mmol/L (ref 0.5–1.9)
Lactic Acid, Venous: 2.4 mmol/L (ref 0.5–1.9)

## 2021-11-29 LAB — LIPASE, BLOOD: Lipase: 26 U/L (ref 11–51)

## 2021-11-29 LAB — CBG MONITORING, ED: Glucose-Capillary: 261 mg/dL — ABNORMAL HIGH (ref 70–99)

## 2021-11-29 LAB — HEMOGLOBIN A1C
Hgb A1c MFr Bld: 8 % — ABNORMAL HIGH (ref 4.8–5.6)
Mean Plasma Glucose: 183 mg/dL

## 2021-11-29 LAB — TSH: TSH: 2.34 u[IU]/mL (ref 0.35–5.50)

## 2021-11-29 MED ORDER — SODIUM CHLORIDE 0.9 % IV BOLUS
500.0000 mL | Freq: Once | INTRAVENOUS | Status: AC
  Administered 2021-11-29: 500 mL via INTRAVENOUS

## 2021-11-29 MED ORDER — SODIUM CHLORIDE 0.9% FLUSH
3.0000 mL | Freq: Two times a day (BID) | INTRAVENOUS | Status: DC
  Administered 2021-11-30: 3 mL via INTRAVENOUS

## 2021-11-29 MED ORDER — INSULIN ASPART 100 UNIT/ML IJ SOLN
0.0000 [IU] | Freq: Three times a day (TID) | INTRAMUSCULAR | Status: DC
  Administered 2021-11-29 – 2021-11-30 (×2): 2 [IU] via SUBCUTANEOUS

## 2021-11-29 MED ORDER — ISOSORBIDE MONONITRATE ER 60 MG PO TB24
60.0000 mg | ORAL_TABLET | Freq: Every day | ORAL | Status: DC
  Administered 2021-11-30: 60 mg via ORAL
  Filled 2021-11-29: qty 1

## 2021-11-29 MED ORDER — HYDRALAZINE HCL 25 MG PO TABS: 25.0000 mg | ORAL_TABLET | Freq: Four times a day (QID) | ORAL | Status: DC | PRN

## 2021-11-29 MED ORDER — LINAGLIPTIN 5 MG PO TABS
5.0000 mg | ORAL_TABLET | Freq: Every day | ORAL | Status: DC
  Administered 2021-11-30: 5 mg via ORAL
  Filled 2021-11-29: qty 1

## 2021-11-29 MED ORDER — ENSURE ENLIVE PO LIQD
237.0000 mL | Freq: Two times a day (BID) | ORAL | Status: DC
  Administered 2021-11-29 – 2021-11-30 (×3): 237 mL via ORAL

## 2021-11-29 MED ORDER — CARVEDILOL 3.125 MG PO TABS
3.1250 mg | ORAL_TABLET | Freq: Two times a day (BID) | ORAL | Status: DC
  Administered 2021-11-29 – 2021-11-30 (×2): 3.125 mg via ORAL
  Filled 2021-11-29 (×2): qty 1

## 2021-11-29 MED ORDER — SODIUM CHLORIDE 0.9 % IV SOLN: INTRAVENOUS | Status: AC

## 2021-11-29 MED ORDER — HEPARIN SODIUM (PORCINE) 5000 UNIT/ML IJ SOLN
5000.0000 [IU] | Freq: Two times a day (BID) | INTRAMUSCULAR | Status: DC
Start: 2021-11-29 — End: 2021-11-30
  Administered 2021-11-29 – 2021-11-30 (×2): 5000 [IU] via SUBCUTANEOUS
  Filled 2021-11-29 (×3): qty 1

## 2021-11-29 NOTE — Telephone Encounter (Signed)
Patient called and wanted to speak to the triage nurse because she said her blood sugar was really, I asked her what it was running and she said it was reading 475 right now so I got her over to the triage nurse.

## 2021-11-29 NOTE — ED Triage Notes (Signed)
Patient here for evaluation of hyperglycemia,CBG 462 at home. Patient was recently taken of metformin several days ago. Patient is alert and in no apparent distress at this time.

## 2021-11-29 NOTE — ED Provider Notes (Signed)
Emergency Department Provider Note   I have reviewed the triage vital signs and the nursing notes.   HISTORY  Chief Complaint Hyperglycemia and Hypotension   HPI Morgan Hubbard is a 87 y.o. female past history reviewed below including recovered EF congestive failure, CKD with solitary kidney, hypertension, and diabetes presents to the emergency department with fatigue in the setting of low blood pressure and hyperglycemia.  Of note, the patient was admitted to [place] last week for severe hypertension and acute kidney injury.  She was treated for hypertensive urgency and dehydration.  Lab work was repeated yesterday with her family medicine MD. patient continues to hold her losartan/HCTZ but continues with Imdur, Coreg, hydralazine.  Her husband checks her blood pressures regularly including at 11 AM this morning at which point it was 544 systolic.  They checked blood sugar this morning and found it to be greater than 400.  Her metformin was stopped in the setting of acute kidney injury but she has been compliant with linagliptin. Decreased PO intake as been an ongoing issue but patient and husband report taking meds this AM as prescribed. No fever.  She denies any discomfort in the chest, shortness of breath, abdominal discomfort.  No vomiting or diarrhea.  She states she is has a funny sensation in her teeth but denies pain in this location or radiation of symptoms.    Past Medical History:  Diagnosis Date   Arthritis    "hands" (01/13/2014)   Basal cell carcinoma 01/2014   "bridge of nose"   Cardiac LV ejection fraction >40%    "it was 43 last year" (01/13/2014)   Chronic lower back pain    CKD (chronic kidney disease), stage III ([place])    acute on chronic stage III/notes 06/10/2018   Colon polyps    Expressive aphasia    "3 times in the last week" (01/13/2014)   GERD (gastroesophageal reflux disease)    Goiter past remote   treated with RI   Hyperlipidemia    Hyperpotassemia     Hypertension    Hypertonicity of bladder    Inflammatory and toxic neuropathy, unspecified    LBBB (left bundle branch block)    Migraine    "stopped many years ago" (01/13/2014)   Mini stroke 01/2014   Other primary cardiomyopathies    Overactive bladder    Presence of combination internal cardiac defibrillator (ICD) and pacemaker    Reflex sympathetic dystrophy, unspecified    Shingles    Shortness of breath    ambulation   Stroke ([place])    Thyroid disease    Type II diabetes mellitus ([place])    Ulcer    Urine incontinence    Vertigo    hx of    Review of Systems  Constitutional: No fever/chills. Positive weakness.  Eyes: No visual changes. ENT: No sore throat. Cardiovascular: Denies chest pain. Respiratory: Denies shortness of breath. Gastrointestinal: No abdominal pain.  No nausea, no vomiting.  No diarrhea.  No constipation. Genitourinary: Negative for dysuria. Musculoskeletal: Negative for back pain. Skin: Negative for rash. Neurological: Negative for headaches, focal weakness or numbness.  ____________________________________________   PHYSICAL EXAM:  VITAL SIGNS: ED Triage Vitals  Enc Vitals Group     BP 11/29/21 1225 (!) 82/48     Pulse Rate 11/29/21 1225 (!) 59     Resp 11/29/21 1225 16     Temp 11/29/21 1225 98 F (36.7 C)     Temp Source 11/29/21 1225 Oral  SpO2 11/29/21 1225 99 %   Constitutional: Alert and oriented. Slightly drowsy but awakens to voice and is conversational.  Eyes: Conjunctivae are normal. Head: Atraumatic. Nose: No congestion/rhinnorhea. Mouth/Throat: Mucous membranes are moist.   Neck: No stridor.   Cardiovascular: Normal rate, regular rhythm. Good peripheral circulation. Grossly normal heart sounds.   Respiratory: Normal respiratory effort.  No retractions. Lungs CTAB. Gastrointestinal: Soft and nontender. No distention.  Musculoskeletal: No lower extremity tenderness nor edema. No gross deformities of  extremities. Neurologic:  Normal speech and language. No gross focal neurologic deficits are appreciated.  Skin:  Skin is warm, dry and intact. No rash noted.  ____________________________________________   LABS (all labs ordered are listed, but only abnormal results are displayed)  Labs Reviewed  BASIC METABOLIC PANEL - Abnormal; Notable for the following components:      Result Value   Glucose, Bld 287 (*)    BUN 28 (*)    Creatinine, Ser 1.96 (*)    GFR, Estimated 25 (*)    All other components within normal limits  CBC - Abnormal; Notable for the following components:   RBC 3.51 (*)    Hemoglobin 10.6 (*)    HCT 32.5 (*)    All other components within normal limits  HEPATIC FUNCTION PANEL - Abnormal; Notable for the following components:   Total Protein 5.3 (*)    Albumin 2.8 (*)    All other components within normal limits  LACTIC ACID, PLASMA - Abnormal; Notable for the following components:   Lactic Acid, Venous 2.1 (*)    All other components within normal limits  LACTIC ACID, PLASMA - Abnormal; Notable for the following components:   Lactic Acid, Venous 2.4 (*)    All other components within normal limits  HEMOGLOBIN A1C - Abnormal; Notable for the following components:   Hgb A1c MFr Bld 8.0 (*)    All other components within normal limits  BASIC METABOLIC PANEL - Abnormal; Notable for the following components:   Glucose, Bld 139 (*)    BUN 27 (*)    Creatinine, Ser 1.75 (*)    Calcium 8.8 (*)    GFR, Estimated 28 (*)    All other components within normal limits  CBC - Abnormal; Notable for the following components:   RBC 3.46 (*)    Hemoglobin 10.4 (*)    HCT 31.6 (*)    All other components within normal limits  GLUCOSE, CAPILLARY - Abnormal; Notable for the following components:   Glucose-Capillary 190 (*)    All other components within normal limits  URINALYSIS, ROUTINE W REFLEX MICROSCOPIC - Abnormal; Notable for the following components:   Glucose, UA  >=500 (*)    Protein, ur 100 (*)    All other components within normal limits  GLUCOSE, CAPILLARY - Abnormal; Notable for the following components:   Glucose-Capillary 195 (*)    All other components within normal limits  GLUCOSE, CAPILLARY - Abnormal; Notable for the following components:   Glucose-Capillary 159 (*)    All other components within normal limits  CBG MONITORING, ED - Abnormal; Notable for the following components:   Glucose-Capillary 261 (*)    All other components within normal limits  CULTURE, BLOOD (SINGLE)  LIPASE, BLOOD  MAGNESIUM  CBG MONITORING, ED  TROPONIN I (HIGH SENSITIVITY)  TROPONIN I (HIGH SENSITIVITY)   ____________________________________________  EKG   EKG Interpretation  Date/Time:  Thursday November 29 2021 12:38:30 EDT Ventricular Rate:  60 PR Interval:  76 QRS  Duration: 145 QT Interval:  456 QTC Calculation: 456 R Axis:   120 Text Interpretation: Atrial-ventricular dual-paced rhythm No further analysis attempted due to paced rhythm Confirmed by Nanda Quinton [id number]) on 11/29/2021 12:55:35 PM        ____________________________________________  RADIOLOGY  DG Chest Portable 1 View  Result Date: 11/29/2021 CLINICAL DATA:  Weakness. EXAM: PORTABLE CHEST 1 VIEW COMPARISON:  Chest x-ray dated June 10, 2018. FINDINGS: Unchanged left chest wall pacemaker. Unchanged mild cardiomegaly. Normal pulmonary vascularity. No focal consolidation, pleural effusion, or pneumothorax. No acute osseous abnormality. IMPRESSION: 1. No active disease. Electronically Signed   By: Titus Dubin M.D.   On: 11/29/2021 13:11    ____________________________________________   PROCEDURES  Procedure(s) performed:   Procedures  CRITICAL CARE Performed by: Margette Fast Total critical care time: 35 minutes Critical care time was exclusive of separately billable procedures and treating other patients. Critical care was necessary to treat or prevent imminent or  life-threatening deterioration. Critical care was time spent personally by me on the following activities: development of treatment plan with patient and/or surrogate as well as nursing, discussions with consultants, evaluation of patient's response to treatment, examination of patient, obtaining history from patient or surrogate, ordering and performing treatments and interventions, ordering and review of laboratory studies, ordering and review of radiographic studies, pulse oximetry and re-evaluation of patient's condition.  Nanda Quinton, MD Emergency Medicine  ____________________________________________   INITIAL IMPRESSION / ASSESSMENT AND PLAN / ED COURSE  Pertinent labs & imaging results that were available during my care of the patient were reviewed by me and considered in my medical decision making (see chart for details).   This patient is Presenting for Evaluation of AMS, which does require a range of treatment options, and is a complaint that involves a high risk of morbidity and mortality.  The Differential Diagnoses includes but is not exclusive to alcohol, illicit or prescription medications, intracranial pathology such as stroke, intracerebral hemorrhage, fever or infectious causes including sepsis, hypoxemia, uremia, trauma, endocrine related disorders such as diabetes, hypoglycemia, thyroid-related diseases, etc.   Critical Interventions-    Medications  feeding supplement (ENSURE ENLIVE / ENSURE PLUS) liquid 237 mL (237 mLs Oral Given 11/29/21 1718)  sodium chloride flush (NS) 0.9 % injection 3 mL (3 mLs Intravenous Given 11/30/21 0100)  heparin injection 5,000 Units (5,000 Units Subcutaneous Given 11/30/21 0100)  0.9 %  sodium chloride infusion (0 mLs Intravenous Stopped 11/30/21 0254)  hydrALAZINE (APRESOLINE) tablet 25 mg (has no administration in time range)  carvedilol (COREG) tablet 3.125 mg (3.125 mg Oral Given 11/29/21 1717)  isosorbide mononitrate (IMDUR) 24 hr tablet  60 mg (has no administration in time range)  linagliptin (TRADJENTA) tablet 5 mg (has no administration in time range)  insulin aspart (novoLOG) injection 0-9 Units (2 Units Subcutaneous Given 11/30/21 0704)  sodium chloride 0.9 % bolus 500 mL ( Intravenous Stopped 11/29/21 1335)    Reassessment after intervention:  BP improving.    I did obtain Additional Historical Information from husband at bedside.  I decided to review pertinent External Data, and in summary patient with admit last [facility] at [facility]. Discharge summary reviewed from that admission.   Clinical Laboratory Tests Ordered, included slightly elevated lactic acid. Doubt sepsis. No leukocytosis. CKD noted.   Radiologic Tests Ordered, included CXR. I independently interpreted the images and agree with radiology interpretation.   Cardiac Monitor Tracing which shows paced rhythm.   Social Determinants of Health Risk no smoking history.  Consult complete with Hospitalist. Plan for admit.   Medical Decision Making: Summary:  Patient presents emergency department with weakness and fatigue.  She is hyperglycemic to greater than 400 at home.  On arrival she has a blood sugar of 261.  Initial blood pressure hypotensive 82/48 and patient does appear symptomatic.  No fever or other history to suspect septic shock. Low clinical suspicion for GI bleeding. Plan for IVF bolus (starting with 500 mL) and reassess. Question if patient's BP is over-medicated. Will need screening labs for AKI and DKA.  Reevaluation with update and discussion with patient and husband. They agree with plan for admit.   Disposition: admit  ____________________________________________  FINAL CLINICAL IMPRESSION(S) / ED DIAGNOSES  Final diagnoses:  Hypotension, unspecified hypotension type  Hyperglycemia    Note:  This document was prepared using Dragon voice recognition software and may include unintentional dictation errors.  Nanda Quinton, MD,  [facility] Emergency Medicine    Careen Mauch, [employer], MD 11/30/21 [phone]

## 2021-11-29 NOTE — H&P (Addendum)
History and Physical    Morgan Hubbard IEP:[id number] DOB: 1934-07-05 DOA: 11/29/2021  PCP: Abner Greenspan, MD (Confirm with patient/family/NH records and if not entered, this has to be entered at [facility] point of entry) Patient coming from: Home  I have personally briefly reviewed patient's old medical records in [place]  Chief Complaint: Feeling weak  HPI: Morgan Hubbard is a 87 y.o. female with medical history significant of HTN, chronic systolic CHF with LVEF 40 to 45% in 2020, secondary to nonischemic cardiomyopathy status post AICD and pacemaker, IIDM, CKD stage IIIb, HLD, TIA, came with near syncope.  Family found patient glucose this morning 462 and decided to send patient to the hospital.  Patient herself complains about worsening of generalized weakness, and lightheadedness when standing up.  For the last 4-5 weeks, patient has had significant loss of appetite and frequent feeling of nausea with eating solid food.  She is being only taking Ensure 2 times a day for her food.  She estimated lost about 6 pounds in the last 4-5 weeks.  Her PCP diagnosed her with GERD and Pepcid in addition to this she already takes.  However, she did not experience any significant improvement of her symptoms. Yesterday, she feels nauseous even on liquid, she drank Ensure x2 and half cup water for the whole day.  Denies any vomiting, no diarrhea no abdominal pain.  Last week, patient was hospitalized at [facility] for hypertension emergency.  Her blood pressure regimen was adjusted, HCTZ/losartan discontinued due to surge of kidney function.  Coreg increased from 12.5 twice daily to 25 twice daily, maintained on hydralazine 50 mg twice daily, and Imdur 60 mg daily.  She also underwent GI work-up including gastric emptying study which was normal.  Barium swallow showed a small Zenker's diverticulum.  Her diabetes medications also adjusted, metformin discontinued due to surge of kidney function, [name] also  discontinued due to that the patient could not afford the medication.  And family reported that the patient glucose has been significant elevated since discharge home last Sunday.  ED Course: Patient was found hypotensive systolic in the 66A, blood pressure significant improvement after 500 mL of IV bolus.  Blood work showed there is a AKI of creatinine 1.9 compared to baseline 1.2, 6-monthago.  Review of Systems: As per HPI otherwise 14 point review of systems negative.    Past Medical History:  Diagnosis Date   Arthritis    "hands" (01/13/2014)   Basal cell carcinoma 01/2014   "bridge of nose"   Cardiac LV ejection fraction >40%    "it was 43 last year" (01/13/2014)   Chronic lower back pain    CKD (chronic kidney disease), stage III ([place]    acute on chronic stage III/notes 06/10/2018   Colon polyps    Expressive aphasia    "3 times in the last week" (01/13/2014)   GERD (gastroesophageal reflux disease)    Goiter past remote   treated with RI   Hyperlipidemia    Hyperpotassemia    Hypertension    Hypertonicity of bladder    Inflammatory and toxic neuropathy, unspecified    LBBB (left bundle branch block)    Migraine    "stopped many years ago" (01/13/2014)   Mini stroke 01/2014   Other primary cardiomyopathies    Overactive bladder    Presence of combination internal cardiac defibrillator (ICD) and pacemaker    Reflex sympathetic dystrophy, unspecified    Shingles    Shortness  of breath    ambulation   Stroke [facility])    Thyroid disease    Type II diabetes mellitus ([place])    Ulcer    Urine incontinence    Vertigo    hx of    Past Surgical History:  Procedure Laterality Date   BACK SURGERY     BI-VENTRICULAR PACEMAKER INSERTION (CRT-P)  10/2014   [facility]   BREAST CYST EXCISION Left 1959   CARDIAC CATHETERIZATION  "several"   [place]   CATARACT EXTRACTION W/ INTRAOCULAR LENS IMPLANT Left ~ 2005   several eye injections   CATARACT EXTRACTION W/PHACO Right 05/04/2020    Procedure: CATARACT EXTRACTION PHACO AND INTRAOCULAR LENS PLACEMENT ([place]) RIGHT DIABETIC;  Surgeon: Birder Robson, MD;  Location: [facility] ORS;  Service: Ophthalmology;  Laterality: Right;  [place] 00:58.5 CDE 6.70 Fluid Pack [place] # [id number]   CHOLECYSTECTOMY N/A 06/13/2018   Procedure: LAPAROSCOPIC CHOLECYSTECTOMY;  Surgeon: Donnie Mesa, MD;  Location: [place];  Service: General;  Laterality: N/A;   COLONOSCOPY  7/12   normal (hx of polyps in past)    CT SCAN  3/12   outside hosp- lung nodule and gallstones   ERCP N/A 06/12/2018   Procedure: ENDOSCOPIC RETROGRADE CHOLANGIOPANCREATOGRAPHY (ERCP);  Surgeon: Carol Ada, MD;  Location: [place];  Service: Endoscopy;  Laterality: N/A;   [place]   "knot on index"   KIDNEY DONATION Left 1989   LUMBAR LAMINECTOMY/DECOMPRESSION MICRODISCECTOMY  1999   LUMBAR LAMINECTOMY/DECOMPRESSION MICRODISCECTOMY  01/31/2012   Procedure: LUMBAR LAMINECTOMY/DECOMPRESSION MICRODISCECTOMY 2 LEVELS;  Surgeon: Eustace Moore, MD;  Location: [place] [facility];  Service: Neurosurgery;  Laterality: N/A;  Thoracic twelve-lumbar one, lumbar one-two laminectomy    POSTERIOR LAMINECTOMY / [place]   REMOVAL OF STONES  06/12/2018   Procedure: REMOVAL OF STONES;  Surgeon: Carol Ada, MD;  Location: [facility] END[facility];  Service: Endoscopy;;   SPHINCTEROTOMY  06/12/2018   Procedure: Joan Mayans;  Surgeon: Carol Ada, MD;  Location: [place];  Service: Endoscopy;;   TUBAL LIGATION  1976   UPPER GASTROINTESTINAL ENDOSCOPY       reports that she has never smoked. She has never used smokeless tobacco. She reports that she does not drink alcohol and does not use drugs.  Allergies  Allergen Reactions   Ace Inhibitors Swelling    REACTION: tongue swelling   Codeine Nausea Only   Hydrochlorothiazide Other (See Comments)    Dizziness/funny feeling   Insulin Detemir Itching   Lisinopril Swelling    Swelling of tongue   Nsaids  Other (See Comments)    Renal issues   Tylenol [Acetaminophen] Other (See Comments)    Fatty liver    Family History  Problem Relation Age of Onset   Arthritis Mother    Cancer Mother        uterine and mouth   Hyperlipidemia Mother    Stroke Mother    Hypertension Mother    Alcohol abuse Father    Diabetes Father    Cancer Sister        breast   Diabetes Sister    Cancer Brother        lung cancer   Kidney disease Brother    Diabetes Brother    Hyperlipidemia Sister    Heart disease Sister    Hypertension Sister    Diabetes Sister    Hyperlipidemia Brother    Kidney failure Brother    Diabetes Daughter    Addison's disease Other  Prior to Admission medications   Medication Sig Start Date End Date Taking? Authorizing Provider  aspirin EC 81 MG tablet Take 1 tablet (81 mg total) by mouth daily. Swallow whole. 09/27/20  Yes Minna Merritts, MD  atorvastatin (LIPITOR) 80 MG tablet Take by mouth. 11/24/21 11/24/22 Yes [provider]  carvedilol (COREG) 12.5 MG tablet Take 1 tablet (12.5 mg total) by mouth 2 (two) times daily with a meal. 03/26/21  Yes Gollan, Kathlene November, MD  cetirizine (ZYRTEC) 10 MG tablet Take 10 mg by mouth daily.   Yes [provider]  clotrimazole-betamethasone (LOTRISONE) cream Apply 1 application topically as needed (groin).  06/19/15  Yes [provider]  famotidine (PEPCID) 20 MG tablet Take 1 tablet (20 mg total) by mouth 2 (two) times daily. 11/05/21  Yes Tower, [name], MD  gabapentin (NEURONTIN) 300 MG capsule Take 1 capsule (300 mg total) by mouth 3 (three) times daily. 06/16/20  Yes Tower, [name], MD  hydrALAZINE (APRESOLINE) 50 MG tablet Take 1 tablet (50 mg total) by mouth 2 (two) times daily. Take an extra hydralazine 50 mg as needed for pressure >170 03/26/21  Yes Gollan, Kathlene November, MD  isosorbide mononitrate (IMDUR) 60 MG 24 hr tablet Take 1 tablet (60 mg total) by mouth in the morning and at bedtime. 03/26/21  11/29/21 Yes Gollan, Kathlene November, MD  letrozole [facility]) 2.5 MG tablet Take 2.5 mg by mouth daily.   Yes [provider]  linagliptin (TRADJENTA) 5 MG TABS tablet Take 5 mg by mouth daily. 11/24/21 11/24/22 Yes [provider]  losartan-hydrochlorothiazide (HYZAAR) 100-12.5 MG tablet Take 1 tablet by mouth once daily 09/19/21  Yes Gollan, Kathlene November, MD  metFORMIN (GLUCOPHAGE) 1000 MG tablet Take 1,000 mg by mouth in the morning and at bedtime. 02/29/20  Yes [provider]  mirabegron ER ([name]) 50 MG TB24 tablet Take 1 tablet (50 mg total) by mouth at bedtime. 06/16/20  Yes Tower, [name], MD  nystatin cream (MYCOSTATIN) Apply 1 application topically 2 (two) times daily as needed (irritation.).  12/15/19  Yes [provider]  pantoprazole (PROTONIX) 40 MG tablet Take 1 tablet by mouth once daily 09/19/21  Yes Tower, [name], MD  traMADol (ULTRAM) 50 MG tablet TAKE 1 TO 2 TABLETS BY MOUTH EVERY 8 HOURS AS NEEDED FOR MODERATE TO SEVERE PAIN Patient taking differently: Take 50 mg by mouth every 8 (eight) hours as needed for moderate pain. 10/23/21  Yes Tower, [name], MD  vitamin B-12 (CYANOCOBALAMIN) 1000 MCG tablet Take 1,000 mcg by mouth daily.    Yes [provider]  glimepiride (AMARYL) 2 MG tablet Take 2 mg by mouth daily.  Patient not taking: Reported on 11/29/2021 11/04/18   [provider]  glucose blood test strip Use 3 (three) times daily. 02/08/16   [provider]  TRULICITY 7.59 FM/3.8GY SOPN Inject 0.75 mg into the skin once a week. Patient not taking: Reported on 11/29/2021 07/27/21   [provider]  Vitamin D, Ergocalciferol, (DRISDOL) 1.25 MG (50000 UT) CAPS capsule Take 50,000 Units by mouth every 7 (seven) days. 10/06/18   [provider]    Physical Exam: Vitals:   11/29/21 1430 11/29/21 1500 11/29/21 1530 11/29/21 1555  BP: (!) 131/47 (!) 140/58 (!) 126/53   Pulse: 62 61 64   Resp: '17 12 14   '$ Temp:    98.1  F (36.7 C)  TempSrc:    Oral  SpO2: 98% 98% 100%  Weight:      Height:        Constitutional: NAD, calm, comfortable Vitals:   11/29/21 1430 11/29/21 1500 11/29/21 1530 11/29/21 1555  BP: (!) 131/47 (!) 140/58 (!) 126/53   Pulse: 62 61 64   Resp: '17 12 14   '$ Temp:    98.1 F (36.7 C)  TempSrc:    Oral  SpO2: 98% 98% 100%   Weight:      Height:       Eyes: PERRL, lids and conjunctivae normal ENMT: Mucous membranes are dry. Posterior pharynx clear of any exudate or lesions.Normal dentition.  Neck: normal, supple, no masses, no thyromegaly Respiratory: clear to auscultation bilaterally, no wheezing, no crackles. Normal respiratory effort. No accessory muscle use.  Cardiovascular: Regular rate and rhythm, no murmurs / rubs / gallops. No extremity edema. 2+ pedal pulses. No carotid bruits.  Abdomen: no tenderness, no masses palpated. No hepatosplenomegaly. Bowel sounds positive.  Musculoskeletal: no clubbing / cyanosis. No joint deformity upper and lower extremities. Good ROM, no contractures. Normal muscle tone.  Skin: no rashes, lesions, ulcers. No induration Neurologic: CN 2-12 grossly intact. Sensation intact, DTR normal. Strength 5/5 in all 4.  Psychiatric: Normal judgment and insight. Alert and oriented x 3. Normal mood.    Labs on Admission: I have personally reviewed following labs and imaging studies  CBC: Recent Labs  Lab 11/28/21 1537 11/29/21 1253  WBC 6.2 7.4  NEUTROABS 3.6  --   HGB 11.5* 10.6*  HCT 34.3* 32.5*  MCV 91.6 92.6  PLT 171.0 073   Basic Metabolic Panel: Recent Labs  Lab 11/28/21 1537 11/29/21 1253  NA 137 137  K 5.3 No hemolysis seen* 4.7  CL 100 100  CO2 31 27  GLUCOSE 399* 287*  BUN 30* 28*  CREATININE 1.79* 1.96*  CALCIUM 8.8 8.9   GFR: Estimated Creatinine Clearance: 18 mL/min (A) (by C-G formula based on SCr of 1.96 mg/dL (H)). Liver Function Tests: Recent Labs  Lab 11/28/21 1537 11/29/21 1253  AST 19 21  ALT 13 16   ALKPHOS 66 69  BILITOT 0.4 1.0  PROT 5.4* 5.3*  ALBUMIN 3.4* 2.8*   Recent Labs  Lab 11/29/21 1253  LIPASE 26   No results for input(s): "AMMONIA" in the last 168 hours. Coagulation Profile: No results for input(s): "INR", "PROTIME" in the last 168 hours. Cardiac Enzymes: No results for input(s): "CKTOTAL", "CKMB", "CKMBINDEX", "TROPONINI" in the last 168 hours. BNP (last 3 results) No results for input(s): "PROBNP" in the last 8760 hours. HbA1C: No results for input(s): "HGBA1C" in the last 72 hours. CBG: Recent Labs  Lab 11/29/21 1226  GLUCAP 261*   Lipid Profile: No results for input(s): "CHOL", "HDL", "LDLCALC", "TRIG", "CHOLHDL", "LDLDIRECT" in the last 72 hours. Thyroid Function Tests: Recent Labs    11/28/21 1537  TSH 2.34   Anemia Panel: No results for input(s): "VITAMINB12", "FOLATE", "FERRITIN", "TIBC", "IRON", "RETICCTPCT" in the last 72 hours. Urine analysis:    Component Value Date/Time   COLORURINE AMBER (A) 06/10/2018 1840   APPEARANCEUR CLEAR 06/10/2018 1840   LABSPEC 1.017 06/10/2018 1840   PHURINE 7.0 06/10/2018 1840   GLUCOSEU NEGATIVE 06/10/2018 1840   HGBUR NEGATIVE 06/10/2018 1840   BILIRUBINUR Negative 06/30/2018 1231   [place] 06/10/2018 1840   PROTEINUR Positive (A) 06/30/2018 1231   PROTEINUR 100 (A) 06/10/2018 1840   UROBILINOGEN 0.2 06/30/2018 1231   UROBILINOGEN 0.2 01/13/2014 1102   NITRITE Negative 06/30/2018 1231   NITRITE  NEGATIVE 06/10/2018 1840   LEUKOCYTESUR Trace (A) 06/30/2018 1231    Radiological Exams on Admission: DG Chest Portable 1 View  Result Date: 11/29/2021 CLINICAL DATA:  Weakness. EXAM: PORTABLE CHEST 1 VIEW COMPARISON:  Chest x-ray dated June 10, 2018. FINDINGS: Unchanged left chest wall pacemaker. Unchanged mild cardiomegaly. Normal pulmonary vascularity. No focal consolidation, pleural effusion, or pneumothorax. No acute osseous abnormality. IMPRESSION: 1. No active disease. Electronically  Signed   By: Titus Dubin M.D.   On: 11/29/2021 13:11    EKG: Independently reviewed. V paced  Assessment/Plan Principal Problem:   Hypotension Active Problems:   AKI (acute kidney injury) ([place])   Near syncope  (please populate well all problems here in Problem List. (For example, if patient is on BP meds at home and you resume or decide to hold them, it is a problem that needs to be her. Same for CAD, COPD, HLD and so on)  Near syncope -Likely secondary to volume contraction/dehydration secondary to poor oral intake -Pressure responded to small IV bolus. -Clinically still appears to be hypovolemic, continue low rate of IV fluid for 12 hours, monitor breathing status oxygen level as patient has chronic systolic CHF. -Check echo  AKI on CKD stage IIIb -Clinically volume contracted, IV fluid as above.  Anorexia -Unintentional weight loss 6 pounds in 1 month -Recent GI work-up including barium swallow showed a small Zenker's diverticulum and a normal gastric emptying study, neither can explain patient's symptoms.  Texted [facility] for outpatient versus inpatient GI work-up. -Continue Ensure twice daily -Consult dietitian for calorie count  HTN -Patient family has been closely following patient blood pressure management and it shows after discharge from [place] last week, patient blood pressure has been fairly controlled but on the higher side.  Today's hypotension episode appears to be related to dehydration.  -Will resume her home BP meds stepwise, restart hydralazine as as needed, restart Coreg tonight, restart Imdur tomorrow.  IIDM hyperglycemia -Sliding scale for now -Continue Januvia -Add a small dose of Lantus 10 units daily  Chronic systolic CHF -Volume contracted -Restart BP meds with caution -Slow hydration x12 hours then reevaluate.  Nonischemic cardiomyopathy -AICD and pacemaker, no acute issue.  DVT prophylaxis: Heparin subcu Code Status: Full code Family  Communication: Husband at bedside Disposition Plan: Expect less than 2 midnight hospital stay Consults called: [facility] Admission status: Telemetry observation   Lequita Halt MD [facility] Pager [phone]  11/29/2021, 4:32 PM

## 2021-11-29 NOTE — Telephone Encounter (Addendum)
Adrianna RN with access nurse transferred pt to [facility]; 11/28/21 at 9:08 AM  FBS 248. Pt ate [employer] vegetable soup with bread, vanilla icecream and chicken and dumplings on 11/28/21. Pt cannot remember what else she ate. 11/29/21 at 12:45 AM BS 462. Pt took Tradjenta 5 mg at 9 AM on 11/28/21 and on 11/29/21 at midnight. Metformin is being held. Pt was seen at [facility] on 11/28/21. Pt said she feels nervous and jittery, lightheaded, weak, wobbly when walks, pt feels dehydrated, dry mouth, nauseated and light headed. Pt did not understand she was going to contact endo. Pt wants to know what to do before goes into diabetic coma. I spoke with Dr [name] and lab results from 11/28/21 are not back yet and Dr [name] advised pt should go to ED for eval of dehydration and hyper glycemia. Pt voiced understanding and said that her husband will take pt by car to [facility] ED now (pt declines EMS). Pt will also contact Dr Gabriel Carina endocrinologist also after leaves hospital. Sending note to Dr [name] and [place] CMA. Will attach access nurse note when available.   [facility] - Client TELEPHONE ADVICE RECORD AccessNurse Patient Name: Morgan ELLINGWOOD MS Gender: Female DOB: [birth year] Age: 87 Y 11 M 21 D Return Phone Number: [phone] (Primary), [phone] (Secondary) Address: City/ State/ Zip: [place] [address] Client [facility] - Client Client Site [place] Provider [name], Roque Lias - MD Contact Type Call Who Is Calling Patient / Member / Family / Caregiver Call Type Triage / Clinical Relationship To Patient Self Return Phone Number [phone] (Secondary) Chief Complaint Dizziness Reason for Call Symptomatic / Request for Health Information Initial Comment Caller transferring from office blood sugar is at 475 this morning and last night it was 464 and 510, she was taken off Metformin - caller states she's weak and  dizzy Translation No Nurse Assessment Nurse: Altamease Oiler, RN, Adriana Date/Time (Eastern Time): 11/29/2021 9:18:52 AM Confirm and document reason for call. If symptomatic, describe symptoms. ---pt reports bgl of 475, at 0400 it was 510. pt reports being dizzy, weak, needs to hold on to stuff to walk. metformin and another med was held since last week. was seen yesterday. Does the patient have any new or worsening symptoms? ---Yes Will a triage be completed? ---Yes Related visit to physician within the last 2 weeks? ---Yes Does the PT have any chronic conditions? (i.e. diabetes, asthma, this includes High risk factors for pregnancy, etc.) ---Yes List chronic conditions. ---diabetes htn defibrillator/pacemaker 1 kidney breast cancer- on oral meds Is this a behavioral health or substance abuse call? ---No Guidelines Guideline Title Affirmed Question Affirmed Notes Nurse Date/Time (Eastern Time) Diabetes - High Blood Sugar Blood glucose > 400 mg/dL (22.2 mmol/L) Altamease Oiler, RN, Adriana 11/29/2021 9:22:33 AM Disp. Time Eilene Ghazi Time) Disposition Final User 11/29/2021 9:29:17 AM Call PCP Now Yes Altamease Oiler, RN, Adriana PLEASE NOTE: All timestamps contained within this report are represented as [place] Standard Time. CONFIDENTIALTY NOTICE: This fax transmission is intended only for the addressee. It contains information that is legally privileged, confidential or otherwise protected from use or disclosure. If you are not the intended recipient, you are strictly prohibited from reviewing, disclosing, copying using or disseminating any of this information or taking any action in reliance on or regarding this information. If you have received this fax in error, please notify us immediately by telephone so that we can arrange for its return to [place].  Phone: [phone], Toll-Free: [phone], Fax: [phone] Page: 2 of 2 Call Id: [id number] Final Disposition 11/29/2021 9:29:17 AM Call PCP Now Yes Altamease Oiler,  RN, Adriana Caller Disagree/Comply Comply Caller Understands Yes PreDisposition Call Doctor Care Advice Given Per Guideline CALL PCP NOW: CARE ADVICE given per Diabetes - High Blood Sugar (Adult) guideline. Comments User: Kizzie Fantasia, RN Date/Time Eilene Ghazi Time): 11/29/2021 9:29:05 AM spoke to [employer] in the office. transferred pt to R

## 2021-11-29 NOTE — Telephone Encounter (Signed)
Forwarding to [employer]

## 2021-11-29 NOTE — Telephone Encounter (Signed)
In ED now Watching for correspondence

## 2021-11-30 ENCOUNTER — Observation Stay (HOSPITAL_BASED_OUTPATIENT_CLINIC_OR_DEPARTMENT_OTHER): Payer: Medicare HMO

## 2021-11-30 DIAGNOSIS — R55 Syncope and collapse: Secondary | ICD-10-CM

## 2021-11-30 DIAGNOSIS — I952 Hypotension due to drugs: Secondary | ICD-10-CM

## 2021-11-30 DIAGNOSIS — I5021 Acute systolic (congestive) heart failure: Secondary | ICD-10-CM

## 2021-11-30 DIAGNOSIS — N179 Acute kidney failure, unspecified: Secondary | ICD-10-CM | POA: Diagnosis not present

## 2021-11-30 LAB — BASIC METABOLIC PANEL
Anion gap: 10 (ref 5–15)
BUN: 27 mg/dL — ABNORMAL HIGH (ref 8–23)
CO2: 24 mmol/L (ref 22–32)
Calcium: 8.8 mg/dL — ABNORMAL LOW (ref 8.9–10.3)
Chloride: 105 mmol/L (ref 98–111)
Creatinine, Ser: 1.75 mg/dL — ABNORMAL HIGH (ref 0.44–1.00)
GFR, Estimated: 28 mL/min — ABNORMAL LOW (ref >60)
Glucose, Bld: 139 mg/dL — ABNORMAL HIGH (ref 70–99)
Potassium: 4.1 mmol/L (ref 3.5–5.1)
Sodium: 139 mmol/L (ref 135–145)

## 2021-11-30 LAB — ECHOCARDIOGRAM COMPLETE
Area-P 1/2: 3.2 cm2
Calc EF: 45.3 %
Height: 61 in
S' Lateral: 3.5 cm
Single Plane A2C EF: 48.5 %
Single Plane A4C EF: 42.3 %
Weight: 2194.02 oz

## 2021-11-30 LAB — CBC
HCT: 31.6 % — ABNORMAL LOW (ref 36.0–46.0)
Hemoglobin: 10.4 g/dL — ABNORMAL LOW (ref 12.0–15.0)
MCH: 30.1 pg (ref 26.0–34.0)
MCHC: 32.9 g/dL (ref 30.0–36.0)
MCV: 91.3 fL (ref 80.0–100.0)
Platelets: 177 10*3/uL (ref 150–400)
RBC: 3.46 MIL/uL — ABNORMAL LOW (ref 3.87–5.11)
RDW: 12.9 % (ref 11.5–15.5)
WBC: 6.2 10*3/uL (ref 4.0–10.5)
nRBC: 0 % (ref 0.0–0.2)

## 2021-11-30 LAB — GLUCOSE, CAPILLARY
Glucose-Capillary: 159 mg/dL — ABNORMAL HIGH (ref 70–99)
Glucose-Capillary: 114 mg/dL — ABNORMAL HIGH (ref 70–99)

## 2021-11-30 LAB — MAGNESIUM: Magnesium: 1.7 mg/dL (ref 1.7–2.4)

## 2021-11-30 NOTE — Progress Notes (Signed)
Patient given discharge instructions and stated understanding. 

## 2021-11-30 NOTE — TOC Transition Note (Addendum)
Transition of Care [facility]) - CM/SW Discharge Note   Patient Details  Name: Morgan Hubbard MRN: [id number] Date of Birth: [birth year]  [facility] CM/SW Contact:  Zenon Mayo, RN Phone Number: 11/30/2021, 11:15 AM   Clinical Narrative:    Patient is for dc today, she has no needs.  She is active with [facility] for [facility], but since she is observation, do not need a new order.          Patient Goals and CMS Choice        Discharge Placement                       Discharge Plan and Services                                     Social Determinants of Health (SDOH) Interventions     Readmission Risk Interventions     No data to display

## 2021-11-30 NOTE — Care Management Obs Status (Signed)
[place] NOTIFICATION   Patient Details  Name: Morgan Hubbard MRN: [id number] Date of Birth: 1934-10-19   Medicare Observation Status Notification Given:  Yes    Zenon Mayo, RN 11/30/2021, 12:13 PM

## 2021-11-30 NOTE — Evaluation (Signed)
Physical Therapy Evaluation Patient Details Name: Morgan Hubbard MRN: [id number] DOB: [birth year] Today's Date: 11/30/2021  History of Present Illness  87 y.o. female presents to [facility] hospital on 11/29/2021 with near-syncopal episode as well as hyperglycemia. Workup demonstrates hypotension and AKI. Of note pt hospitalized at [facility] last week with hypertensive emergency. PMH includes OA, CKD, HLD, HTN, LBBB, CVA, DMII.  Clinical Impression  Pt presents to PT not far from her baseline. Pt is able to ambulate with rollator for community distances, negotiates stairs with similar technique to baseline. PT encourages frequent mobility during this admission in an effort to maintain her current level of function. PT recommends discharge home with no PT follow-up when medically ready.       Recommendations for follow up therapy are one component of a multi-disciplinary discharge planning process, led by the attending physician.  Recommendations may be updated based on patient status, additional functional criteria and insurance authorization.  Follow Up Recommendations No PT follow up      Assistance Recommended at Discharge None  Patient can return home with the following  Assist for transportation;Help with stairs or ramp for entrance    Equipment Recommendations None recommended by PT  Recommendations for Other Services       Functional Status Assessment Patient has had a recent decline in their functional status and demonstrates the ability to make significant improvements in function in a reasonable and predictable amount of time.     Precautions / Restrictions Precautions Precautions: None Restrictions Weight Bearing Restrictions: No      Mobility  Bed Mobility Overal bed mobility: Modified Independent                  Transfers Overall transfer level: Modified independent Equipment used: Rollator (4 wheels)                    Ambulation/Gait Ambulation/Gait  assistance: Supervision Gait Distance (Feet): 250 Feet Assistive device: Rollator (4 wheels) Gait Pattern/deviations: Step-through pattern Gait velocity: functional Gait velocity interpretation: 1.31 - 2.62 ft/sec, indicative of limited community ambulator   General Gait Details: pt with steady step-through gait  Stairs Stairs: Yes Stairs assistance: Supervision Stair Management: One rail Right, Step to pattern (descends steps backward) Number of Stairs: 4    Wheelchair Mobility    Modified Rankin (Stroke Patients Only)       Balance Overall balance assessment: Needs assistance Sitting-balance support: No upper extremity supported, Feet supported Sitting balance-Leahy Scale: Good     Standing balance support: Single extremity supported, Reliant on assistive device for balance Standing balance-Leahy Scale: Poor                               Pertinent Vitals/Pain Pain Assessment Pain Assessment: No/denies pain    Home Living Family/patient expects to be discharged to:: Private residence Living Arrangements: Spouse/significant other Available Help at Discharge: Family;Available 24 hours/day Type of Home: House Home Access: Stairs to enter Entrance Stairs-Rails: Right Entrance Stairs-Number of Steps: 4   Home Layout: Able to live on main level with bedroom/bathroom Home Equipment: Rollator (4 wheels);Cane - single point      Prior Function Prior Level of Function : Independent/Modified Independent             Mobility Comments: ambulates with cane in the home, rollator in the community       Hand Dominance        Extremity/Trunk  Assessment   Upper Extremity Assessment Upper Extremity Assessment: Overall WFL for tasks assessed    Lower Extremity Assessment Lower Extremity Assessment: Overall WFL for tasks assessed    Cervical / Trunk Assessment Cervical / Trunk Assessment: Kyphotic  Communication   Communication: No difficulties   Cognition Arousal/Alertness: Awake/alert Behavior During Therapy: WFL for tasks assessed/performed Overall Cognitive Status: Within Functional Limits for tasks assessed                                          General Comments General comments (skin integrity, edema, etc.): VSS on RA    Exercises     Assessment/Plan    PT Assessment Patient needs continued PT services  PT Problem List Decreased activity tolerance;Decreased balance;Decreased mobility       PT Treatment Interventions DME instruction;Gait training;Stair training;Functional mobility training;Therapeutic activities;Therapeutic exercise;Balance training;Neuromuscular re-education;Patient/family education    PT Goals (Current goals can be found in the Care Plan section)  Acute Rehab PT Goals Patient Stated Goal: to go home PT Goal Formulation: With patient Time For Goal Achievement: 12/14/21 Potential to Achieve Goals: Good Additional Goals Additional Goal #1: Pt will score >19/24 on the DGI to indicate a reduced risk for falls    Frequency Min 3X/week     Co-evaluation               AM-PAC PT "6 Clicks" Mobility  Outcome Measure Help needed turning from your back to your side while in a flat bed without using bedrails?: None Help needed moving from lying on your back to sitting on the side of a flat bed without using bedrails?: None Help needed moving to and from a bed to a chair (including a wheelchair)?: None Help needed standing up from a chair using your arms (e.g., wheelchair or bedside chair)?: None Help needed to walk in hospital room?: A Little Help needed climbing 3-5 steps with a railing? : A Little 6 Click Score: 22    End of Session   Activity Tolerance: Patient tolerated treatment well Patient left: in chair;with call bell/phone within reach;with family/visitor present Nurse Communication: Mobility status PT Visit Diagnosis: Other abnormalities of gait and mobility  (R26.89)    Time: 7017-7939 PT Time Calculation (min) (ACUTE ONLY): 30 min   Charges:   PT Evaluation $PT Eval Low Complexity: [place], PT, DPT Acute Rehabilitation Office [phone]   Zenaida Niece 11/30/2021, 11:51 AM

## 2021-11-30 NOTE — Progress Notes (Signed)
Mobility Specialist Progress Note:   11/30/21 1130  Mobility  Activity Transferred from bed to chair  Level of Assistance Standby assist, set-up cues, supervision of patient - no hands on  Assistive Device Front wheel walker  Distance Ambulated (ft) 4 ft  Activity Response Tolerated well  $Mobility charge 1 Mobility   Pt received in bed willing to participate in mobility. No complaints of pain. Left in chair with call bell in reach and all needs met.   [facility] Lovel Suazo Mobility Specialist

## 2021-11-30 NOTE — Discharge Summary (Signed)
Physician Discharge Summary  Morgan Hubbard UUE:[id number] DOB: 1934-11-05 DOA: 11/29/2021  PCP: Abner Greenspan, MD  Admit date: 11/29/2021 Discharge date: 11/30/2021  Admitted From: Home Disposition: Home  Recommendations for Outpatient Follow-up:  Follow up with PCP in 1-2 weeks Please obtain BMP/CBC in one week Keep up your follow-up with cardiology that is scheduled next week. Hydrate well. Check blood pressures at home and keep a logbook and bring it to doctors office.  Home Health: N/A Equipment/Devices: N/A  Discharge Condition: Stable CODE STATUS: Full code Diet recommendation: Low-salt low-carb diet  Discharge summary:  87 year old with history of hypertension, chronic systolic congestive heart failure with known ejection fraction 40 to 45%, nonischemic cardiomyopathy status post AICD and pacemaker, type 2 diabetes, CKD stage IIIb with baseline creatinine about 1.5/1.6 and solitary kidney, hyperlipidemia, TIA came to the ER with near syncope after recent adjustment on blood pressure medications.  She is suffering from nausea and poor appetite for some time and this was extensively investigated recently including gastric emptying test.  She was recently admitted to [facility] with hypertensive emergency, she was sent home on increased dose of Coreg, hydralazine and Imdur.  GI work-up including gastric emptying test was normal.  Metformin was discontinued and she was sent home on Sitagliptin.  In the emergency room blood pressures were 91T systolic, 056 cc IV fluid was bolused and her symptoms improved.  She had evidence of AKI with creatinine 1.98 as compared to creatinine 1.6 on recent discharge.  Admitted overnight for observation.  Orthostatic syncope in a patient with multiple blood pressure medications, chronic kidney disease, recent poor appetite and poor oral intake. 500 cc normal saline given, symptoms improved, negative for orthostatic symptoms. Resume carvedilol Continue to  hold losartan hydrochlorothiazide as previously planned Hold hydralazine and Imdur, check blood pressures at home and resume only when blood pressures more than 160.  Instructions were given to patient and her husband. Appetite has improved now, hopefully she can keep up with nutrition and water intake.  AKI on CKD stage IV: More or less stable.  Trending down.  Will need recheck in 1 week.  Type 2 diabetes: Currently taking Sitagliptin.  Metformin on hold.  Blood sugars are stable.  Other chronic medical issues including AICD and pacemaker, blood sugars remained stable.   Discharge Diagnoses:  Principal Problem:   Hypotension Active Problems:   AKI (acute kidney injury) ([place])   Near syncope    Discharge Instructions  Discharge Instructions     Diet - low sodium heart healthy   Complete by: As directed    Diet Carb Modified   Complete by: As directed    Discharge instructions   Complete by: As directed    Resume taking hydralazine and Isosorbide on Monday 7/3. Continue to hold metformin and Losartan/HCTZ.   Increase activity slowly   Complete by: As directed       Allergies as of 11/30/2021       Reactions   Ace Inhibitors Swelling   REACTION: tongue swelling   Codeine Nausea Only   Hydrochlorothiazide Other (See Comments)   Dizziness/funny feeling   Insulin Detemir Itching   Lisinopril Swelling   Swelling of tongue   Nsaids Other (See Comments)   Renal issues   Tylenol [acetaminophen] Other (See Comments)   Fatty liver        Medication List     STOP taking these medications    glimepiride 2 MG tablet Commonly known as: AMARYL   glucose  blood test strip   losartan-hydrochlorothiazide 100-12.5 MG tablet Commonly known as: HYZAAR   Trulicity 1.61 WR/6.0AV Sopn Generic drug: Dulaglutide       TAKE these medications    aspirin EC 81 MG tablet Take 1 tablet (81 mg total) by mouth daily. Swallow whole.   atorvastatin 80 MG tablet Commonly  known as: LIPITOR Take by mouth.   carvedilol 12.5 MG tablet Commonly known as: COREG Take 1 tablet (12.5 mg total) by mouth 2 (two) times daily with a meal.   cetirizine 10 MG tablet Commonly known as: ZYRTEC Take 10 mg by mouth daily.   clotrimazole-betamethasone cream Commonly known as: LOTRISONE Apply 1 application topically as needed (groin).   famotidine 20 MG tablet Commonly known as: PEPCID Take 1 tablet (20 mg total) by mouth 2 (two) times daily.   gabapentin 300 MG capsule Commonly known as: NEURONTIN Take 1 capsule (300 mg total) by mouth 3 (three) times daily.   hydrALAZINE 50 MG tablet Commonly known as: APRESOLINE Take 1 tablet (50 mg total) by mouth 2 (two) times daily. Take an extra hydralazine 50 mg as needed for pressure >170   isosorbide mononitrate 60 MG 24 hr tablet Commonly known as: IMDUR Take 1 tablet (60 mg total) by mouth in the morning and at bedtime.   letrozole 2.5 MG tablet Commonly known as: FEMARA Take 2.5 mg by mouth daily.   linagliptin 5 MG Tabs tablet Commonly known as: TRADJENTA Take 5 mg by mouth daily.   metFORMIN 1000 MG tablet Commonly known as: GLUCOPHAGE Take 1,000 mg by mouth in the morning and at bedtime.   mirabegron ER 50 MG Tb24 tablet Commonly known as: [name] Take 1 tablet (50 mg total) by mouth at bedtime.   nystatin cream Commonly known as: MYCOSTATIN Apply 1 application topically 2 (two) times daily as needed (irritation.).   pantoprazole 40 MG tablet Commonly known as: PROTONIX Take 1 tablet by mouth once daily   traMADol 50 MG tablet Commonly known as: ULTRAM TAKE 1 TO 2 TABLETS BY MOUTH EVERY 8 HOURS AS NEEDED FOR MODERATE TO SEVERE PAIN What changed: See the new instructions.   vitamin B-12 1000 MCG tablet Commonly known as: CYANOCOBALAMIN Take 1,000 mcg by mouth daily.   Vitamin D (Ergocalciferol) 1.25 MG (50000 UNIT) Caps capsule Commonly known as: DRISDOL Take 50,000 Units by mouth every 7  (seven) days.        Follow-up Information     Tower, [name], MD. Go on 12/07/2021.   Specialties: Family Medicine, Radiology Why: '@12'$ :49 with Dr.Duncan Contact information: [place] [address] [phone]                Allergies  Allergen Reactions   Ace Inhibitors Swelling    REACTION: tongue swelling   Codeine Nausea Only   Hydrochlorothiazide Other (See Comments)    Dizziness/funny feeling   Insulin Detemir Itching   Lisinopril Swelling    Swelling of tongue   Nsaids Other (See Comments)    Renal issues   Tylenol [Acetaminophen] Other (See Comments)    Fatty liver    Consultations: None   Procedures/Studies: ECHOCARDIOGRAM COMPLETE  Result Date: 11/30/2021    ECHOCARDIOGRAM REPORT   Patient Name:   Morgan Hubbard [facility] Date of Exam: 11/30/2021 Medical Rec #:  [id number]     Height:       61.0 in Accession #:    [id number]    Weight:  137.1 lb Date of Birth:  1935-04-23      BSA:          1.609 m Patient Age:    19 years      BP:           143/65 mmHg Patient Gender: F             HR:           59 bpm. Exam Location:  Inpatient Procedure: 2D Echo, Cardiac Doppler and Color Doppler Indications:    CHF-Acute Systolic J28.78  History:        Patient has prior history of Echocardiogram examinations, most                 recent 06/12/2018. Pacemaker, Stroke, Arrythmias:LBBB; Risk                 Factors:Hypertension, Diabetes and Dyslipidemia. Chronic kidney                 disease.  Sonographer:    Darlina Sicilian RDCS Referring Phys: [id number] [place]  1. Abnormal septal motion . Left ventricular ejection fraction, by estimation, is 45 to 50%. The left ventricle has mildly decreased function. The left ventricle has no regional wall motion abnormalities. Left ventricular diastolic parameters are consistent with Grade I diastolic dysfunction (impaired relaxation). Elevated left ventricular end-diastolic pressure.  2. Pacer wires in RA/RV.  Right ventricular systolic function is normal. The right ventricular size is normal. There is mildly elevated pulmonary artery systolic pressure.  3. The mitral valve is abnormal. Trivial mitral valve regurgitation. No evidence of mitral stenosis. Moderate mitral annular calcification.  4. Tricuspid valve regurgitation is mild to moderate.  5. The aortic valve is tricuspid. There is mild calcification of the aortic valve. Aortic valve regurgitation is trivial. Aortic valve sclerosis is present, with no evidence of aortic valve stenosis.  6. The inferior vena cava is normal in size with greater than 50% respiratory variability, suggesting right atrial pressure of 3 mmHg. FINDINGS  Left Ventricle: Abnormal septal motion. Left ventricular ejection fraction, by estimation, is 45 to 50%. The left ventricle has mildly decreased function. The left ventricle has no regional wall motion abnormalities. The left ventricular internal cavity  size was normal in size. There is no left ventricular hypertrophy. Left ventricular diastolic parameters are consistent with Grade I diastolic dysfunction (impaired relaxation). Elevated left ventricular end-diastolic pressure. Right Ventricle: Pacer wires in RA/RV. The right ventricular size is normal. No increase in right ventricular wall thickness. Right ventricular systolic function is normal. There is mildly elevated pulmonary artery systolic pressure. The tricuspid regurgitant velocity is 2.81 m/s, and with an assumed right atrial pressure of 8 mmHg, the estimated right ventricular systolic pressure is 47.0 mmHg. Left Atrium: Left atrial size was normal in size. Right Atrium: Right atrial size was normal in size. Pericardium: Trivial pericardial effusion is present. The pericardial effusion is posterior to the left ventricle. Mitral Valve: The mitral valve is abnormal. There is mild thickening of the mitral valve leaflet(s). There is mild calcification of the mitral valve leaflet(s).  Moderate mitral annular calcification. Trivial mitral valve regurgitation. No evidence of mitral valve stenosis. Tricuspid Valve: The tricuspid valve is normal in structure. Tricuspid valve regurgitation is mild to moderate. No evidence of tricuspid stenosis. Aortic Valve: The aortic valve is tricuspid. There is mild calcification of the aortic valve. Aortic valve regurgitation is trivial. Aortic valve sclerosis is present, with no evidence of  aortic valve stenosis. Pulmonic Valve: The pulmonic valve was normal in structure. Pulmonic valve regurgitation is not visualized. No evidence of pulmonic stenosis. Aorta: The aortic root is normal in size and structure. Venous: The inferior vena cava is normal in size with greater than 50% respiratory variability, suggesting right atrial pressure of 3 mmHg. IAS/Shunts: No atrial level shunt detected by color flow Doppler. Additional Comments: A device lead is visualized.  LEFT VENTRICLE PLAX 2D LVIDd:         4.30 cm     Diastology LVIDs:         3.50 cm     LV e' medial:    4.67 cm/s LV PW:         1.00 cm     LV E/e' medial:  18.4 LV IVS:        1.00 cm     LV e' lateral:   2.53 cm/s LVOT diam:     1.70 cm     LV E/e' lateral: 34.0 LV SV:         45 LV SV Index:   28 LVOT Area:     2.27 cm  LV Volumes (MOD) LV vol d, MOD A2C: 75.2 ml LV vol d, MOD A4C: 83.6 ml LV vol s, MOD A2C: 38.7 ml LV vol s, MOD A4C: 48.2 ml LV SV MOD A2C:     36.5 ml LV SV MOD A4C:     83.6 ml LV SV MOD BP:      36.5 ml RIGHT VENTRICLE RV S prime:     8.78 cm/s TAPSE (M-mode): 2.0 cm LEFT ATRIUM             Index        RIGHT ATRIUM           Index LA diam:        2.00 cm 1.24 cm/m   RA Area:     12.70 cm LA Vol (A2C):   43.8 ml 27.22 ml/m  RA Volume:   26.90 ml  16.72 ml/m LA Vol (A4C):   24.5 ml 15.23 ml/m LA Biplane Vol: 35.1 ml 21.82 ml/m  AORTIC VALVE LVOT Vmax:   95.80 cm/s LVOT Vmean:  67.100 cm/s LVOT VTI:    0.200 m  AORTA Ao Root diam: 2.80 cm MITRAL VALVE                TRICUSPID  VALVE MV Area (PHT): 3.20 cm     TR Peak grad:   31.6 mmHg MV Decel Time: 237 msec     TR Vmax:        281.00 cm/s MV E velocity: 85.90 cm/s MV A velocity: 103.00 cm/s  SHUNTS MV E/A ratio:  0.83         Systemic VTI:  0.20 m                             Systemic Diam: 1.70 cm Jenkins Rouge MD Electronically signed by Jenkins Rouge MD Signature Date/Time: 11/30/2021/9:22:16 AM    Final    DG Chest Portable 1 View  Result Date: 11/29/2021 CLINICAL DATA:  Weakness. EXAM: PORTABLE CHEST 1 VIEW COMPARISON:  Chest x-ray dated June 10, 2018. FINDINGS: Unchanged left chest wall pacemaker. Unchanged mild cardiomegaly. Normal pulmonary vascularity. No focal consolidation, pleural effusion, or pneumothorax. No acute osseous abnormality. IMPRESSION: 1. No active disease. Electronically Signed   By: Orville Govern.D.  On: 11/29/2021 13:11   (Echo, Carotid, EGD, Colonoscopy, ERCP)    Subjective: Patient was seen and examined.  Husband at the bedside.  Detailed discussion about blood pressure medications and management.  She denies any complaints today.  Wants to go home.  She thinks her appetite is coming back and looking forward to eat some good food. No overnight events on telemetry. 2D echocardiogram with ejection fraction 40 to 45%, no new abnormalities.  Comparable to previous echocardiogram. Telemetry with paced rhythm.   Discharge Exam: Vitals:   11/30/21 0449 11/30/21 0741  BP: (!) 143/61 (!) 143/65  Pulse:  60  Resp: 18 18  Temp: 97.8 F (36.6 C) 98.3 F (36.8 C)  SpO2: 95% 93%   Vitals:   11/29/21 2033 11/29/21 2332 11/30/21 0449 11/30/21 0741  BP: (!) 165/52 (!) 162/61 (!) 143/61 (!) 143/65  Pulse: 64 64  60  Resp: '20 15 18 18  '$ Temp: 97.9 F (36.6 C) 98.1 F (36.7 C) 97.8 F (36.6 C) 98.3 F (36.8 C)  TempSrc: Oral Oral Oral Oral  SpO2:  93% 95% 93%  Weight:   62.2 kg   Height:        General: Pt is alert, awake, not in acute distress Pleasant.  Walking around.  Negative  for orthostatic symptoms and blood pressures. Cardiovascular: RRR, S1/S2 +, no rubs, no gallops, pacemaker left precordium. Respiratory: CTA bilaterally, no wheezing, no rhonchi Abdominal: Soft, NT, ND, bowel sounds + Extremities: no edema, no cyanosis    The results of significant diagnostics from this hospitalization (including imaging, microbiology, ancillary and laboratory) are listed below for reference.     Microbiology: No results found for this or any previous visit (from the past 240 hour(s)).   Labs: BNP (last 3 results) No results for input(s): "BNP" in the last 8760 hours. Basic Metabolic Panel: Recent Labs  Lab 11/28/21 1537 11/29/21 1253 11/30/21 0222  NA 137 137 139  K 5.3 No hemolysis seen* 4.7 4.1  CL 100 100 105  CO2 '31 27 24  '$ GLUCOSE 399* 287* 139*  BUN 30* 28* 27*  CREATININE 1.79* 1.96* 1.75*  CALCIUM 8.8 8.9 8.8*  MG  --   --  1.7   Liver Function Tests: Recent Labs  Lab 11/28/21 1537 11/29/21 1253  AST 19 21  ALT 13 16  ALKPHOS 66 69  BILITOT 0.4 1.0  PROT 5.4* 5.3*  ALBUMIN 3.4* 2.8*   Recent Labs  Lab 11/29/21 1253  LIPASE 26   No results for input(s): "AMMONIA" in the last 168 hours. CBC: Recent Labs  Lab 11/28/21 1537 11/29/21 1253 11/30/21 0222  WBC 6.2 7.4 6.2  NEUTROABS 3.6  --   --   HGB 11.5* 10.6* 10.4*  HCT 34.3* 32.5* 31.6*  MCV 91.6 92.6 91.3  PLT 171.0 185 177   Cardiac Enzymes: No results for input(s): "CKTOTAL", "CKMB", "CKMBINDEX", "TROPONINI" in the last 168 hours. BNP: Invalid input(s): "POCBNP" CBG: Recent Labs  Lab 11/29/21 1226 11/29/21 1724 11/29/21 2113 11/30/21 0627 11/30/21 1109  GLUCAP 261* 190* 195* 159* 114*   D-Dimer No results for input(s): "DDIMER" in the last 72 hours. Hgb A1c Recent Labs    11/29/21 1616  HGBA1C 8.0*   Lipid Profile No results for input(s): "CHOL", "HDL", "LDLCALC", "TRIG", "CHOLHDL", "LDLDIRECT" in the last 72 hours. Thyroid function studies Recent Labs     11/28/21 1537  TSH 2.34   Anemia work up No results for input(s): "VITAMINB12", "FOLATE", "FERRITIN", "TIBC", "IRON", "  RETICCTPCT" in the last 72 hours. Urinalysis    Component Value Date/Time   COLORURINE YELLOW 11/29/2021 1800   APPEARANCEUR CLEAR 11/29/2021 1800   LABSPEC 1.013 11/29/2021 1800   PHURINE 6.0 11/29/2021 1800   GLUCOSEU >=500 (A) 11/29/2021 1800   HGBUR NEGATIVE 11/29/2021 1800   BILIRUBINUR NEGATIVE 11/29/2021 1800   BILIRUBINUR Negative 06/30/2018 1231   KETONESUR NEGATIVE 11/29/2021 1800   PROTEINUR 100 (A) 11/29/2021 1800   UROBILINOGEN 0.2 06/30/2018 1231   UROBILINOGEN 0.2 01/13/2014 1102   NITRITE NEGATIVE 11/29/2021 1800   LEUKOCYTESUR NEGATIVE 11/29/2021 1800   Sepsis Labs Recent Labs  Lab 11/28/21 1537 11/29/21 1253 11/30/21 0222  WBC 6.2 7.4 6.2   Microbiology No results found for this or any previous visit (from the past 240 hour(s)).   Time coordinating discharge: 32 minutes  SIGNED:   Barb Merino, MD  [facility] 11/30/2021, 1:14 PM

## 2021-11-30 NOTE — Progress Notes (Signed)
Orthostatic BPs Supine: 152/54 HR: 61 bpm  Sitting:151/65 HR: 67 bpm  Standing:144/68 HR:66 bpm  Standing after 3 min:166/68 HR: 66 bpm

## 2021-11-30 NOTE — Plan of Care (Signed)
  Problem: Education: Goal: Ability to describe self-care measures that may prevent or decrease complications (Diabetes Survival Skills Education) will improve Outcome: Adequate for Discharge Goal: Individualized Educational Video(s) Outcome: Adequate for Discharge   Problem: Coping: Goal: Ability to adjust to condition or change in health will improve Outcome: Adequate for Discharge   Problem: Fluid Volume: Goal: Ability to maintain a balanced intake and output will improve Outcome: Adequate for Discharge   Problem: Health Behavior/Discharge Planning: Goal: Ability to identify and utilize available resources and services will improve Outcome: Adequate for Discharge Goal: Ability to manage health-related needs will improve Outcome: Adequate for Discharge   Problem: Metabolic: Goal: Ability to maintain appropriate glucose levels will improve Outcome: Adequate for Discharge   Problem: Nutritional: Goal: Maintenance of adequate nutrition will improve Outcome: Adequate for Discharge Goal: Progress toward achieving an optimal weight will improve Outcome: Adequate for Discharge   Problem: Skin Integrity: Goal: Risk for impaired skin integrity will decrease Outcome: Adequate for Discharge   Problem: Tissue Perfusion: Goal: Adequacy of tissue perfusion will improve Outcome: Adequate for Discharge   Problem: Education: Goal: Knowledge of General Education information will improve Description: Including pain rating scale, medication(s)/side effects and non-pharmacologic comfort measures Outcome: Adequate for Discharge   Problem: Health Behavior/Discharge Planning: Goal: Ability to manage health-related needs will improve Outcome: Adequate for Discharge   Problem: Clinical Measurements: Goal: Ability to maintain clinical measurements within normal limits will improve Outcome: Adequate for Discharge Goal: Will remain free from infection Outcome: Adequate for Discharge Goal:  Diagnostic test results will improve Outcome: Adequate for Discharge Goal: Respiratory complications will improve Outcome: Adequate for Discharge Goal: Cardiovascular complication will be avoided Outcome: Adequate for Discharge   Problem: Activity: Goal: Risk for activity intolerance will decrease Outcome: Adequate for Discharge   Problem: Nutrition: Goal: Adequate nutrition will be maintained Outcome: Adequate for Discharge   Problem: Coping: Goal: Level of anxiety will decrease Outcome: Adequate for Discharge   Problem: Elimination: Goal: Will not experience complications related to bowel motility Outcome: Adequate for Discharge Goal: Will not experience complications related to urinary retention Outcome: Adequate for Discharge   Problem: Pain Managment: Goal: General experience of comfort will improve Outcome: Adequate for Discharge   Problem: Safety: Goal: Ability to remain free from injury will improve Outcome: Adequate for Discharge   Problem: Skin Integrity: Goal: Risk for impaired skin integrity will decrease Outcome: Adequate for Discharge   Problem: Acute Rehab PT Goals(only PT should resolve) Goal: Pt Will Go Up/Down Stairs Outcome: Adequate for Discharge Goal: PT Additional Goal #1 Outcome: Adequate for Discharge

## 2021-11-30 NOTE — Progress Notes (Signed)
Mobility Specialist Progress Note:   11/30/21 1020  Mobility  Activity Transferred to/from [facility]  Level of Assistance Standby assist, set-up cues, supervision of patient - no hands on  Assistive Device None  Distance Ambulated (ft) 4 ft  Activity Response Tolerated well  $Mobility charge 1 Mobility   Pt received getting up to [facility]. No complaints of pain. Pt able to have BM. Left in bed with call bell in reach and all needs met.   [facility] Zabria Liss Mobility Specialist

## 2021-11-30 NOTE — Progress Notes (Signed)
  Echocardiogram 2D Echocardiogram has been performed.  Morgan Hubbard M 11/30/2021, 9:09 AM

## 2021-11-30 NOTE — Progress Notes (Signed)
Pt. Had 6 beat run of Vtach while resting in bed. No distress or discomfort noted. Pt. Denies pain. Blood pressure (!) 143/61, pulse 64, temperature 97.8 F (36.6 C), temperature source Oral, resp. rate 18, height '5\' 1"'$  (1.549 m), weight 61.7 kg, last menstrual period 06/03/1992, SpO2 95 %. On call for [facility] paged to make aware.

## 2021-12-04 LAB — CULTURE, BLOOD (SINGLE)
Culture: NO GROWTH
Special Requests: ADEQUATE

## 2021-12-05 ENCOUNTER — Telehealth: Payer: Self-pay | Admitting: Family Medicine

## 2021-12-05 ENCOUNTER — Telehealth: Payer: Self-pay

## 2021-12-05 MED ORDER — METFORMIN HCL 1000 MG PO TABS
ORAL_TABLET | ORAL | Status: DC
Start: 2021-12-05 — End: 2022-01-24

## 2021-12-05 NOTE — Telephone Encounter (Signed)
Transition Care Management Follow-up Telephone Call Date of discharge and from where: 11/30/21 [facility] How have you been since you were released from the hospital? "Weaker than I would like, blood sugar is running high" Any questions or concerns? Yes patient is concerned with high blood sugar readings. Her spouse charts these daily and morning sugars are ranging from 383-474. Reviewed instructions to begin Metformin. Patient has not been taking Metformin and is continuing to only take Tradjenta 5 mg BID as per [facility] discharge instructions from 6/23. Patient is hesitant to begin Metformin as she can not locate her discharge paperwork from her recent stay. Advised the patient I would let Dr. [name] and Dr. Damita Dunnings know and request they contact the patient if she needs to begin Metformin.  Items Reviewed: Did the pt receive and understand the discharge instructions provided?  The patient is not taking medications as indicated on d/c summary Medications obtained and verified?  The patient is following medication recommendations from discharge at [facility] instead of recent discharge from [facility] Other? No  Any new allergies since your discharge? No  Dietary orders reviewed? No Do you have support at home? Yes   Home Care and Equipment/Supplies: Were home health services ordered? Yes- home health was ordered by PCP prior to recent inpatient stay If so, what is the name of the agency? [place] agency set up a time to come to the patient's home? yes Were any new equipment or medical supplies ordered?  No What is the name of the medical supply agency?  Were you able to get the supplies/equipment? not applicable Do you have any questions related to the use of the equipment or supplies? No  Functional Questionnaire: (I = Independent and D = Dependent) ADLs: I  Bathing/Dressing- I  Meal Prep- I  Eating- I  Maintaining continence- I  Transferring/Ambulation- I  Managing Meds-  I  Follow up appointments reviewed:  PCP Hospital f/u appt confirmed? Yes  Scheduled to see Dr. Damita Dunnings on 12/07/21 @ 12:30 pm. [facility] f/u appt confirmed? No   Are transportation arrangements needed? No  If their condition worsens, is the pt aware to call PCP or go to the Emergency Dept.? Yes Was the patient provided with contact information for the PCP's office or ED? Yes Was to pt encouraged to call back with questions or concerns? Yes  Daneen Schick, BSW, CDP Social Worker, Certified Dementia Practitioner [facility] [phone]

## 2021-12-05 NOTE — Telephone Encounter (Signed)
Is this okay?

## 2021-12-05 NOTE — Telephone Encounter (Signed)
Please call pt about her metformin use.  Given her kidney function I would hold it in the meantime, at least until she has the office visit with me on Friday.  I would have her increase her water intake and cut back on carbohydrates to help with her sugar in the meantime.  We can talk about it when she comes in for the visit.  Thanks.

## 2021-12-05 NOTE — Telephone Encounter (Signed)
[place] Name: [facility] Agency Name: Myrtie Cruise Phone #: [phone] Service: PT  Kenney Houseman called and said that she talked to patient and they would like to delay starting care until 12/11/21. They need Dr Alba Cory Approval.

## 2021-12-05 NOTE — Telephone Encounter (Signed)
-----   Message from [facility] sent at 12/05/2021 10:25 AM EDT ----- Regarding: High Blood Glucose Readings Good morning Dr. [name] and Dr. Phillip Heal. I just spoke with Mrs. Fillingim to perform a [facility] call after recent hospital admission. She reports her blood sugars are running very high. Spouse charts these each morning and they are ranging 385-474. I reviewed medications with the patient and there is some confusion.  D/C paperwork from [facility] on 6/23 indicates to stop Metformin and begin Tradjenta 5 mg BID  D/C paperwork from [facility] on 6/30 states to stop Trulicity and begin Metformin 1,000 mg BID  Patient could not locate the [facility] d/c paperwork and continues to follow [facility] discharge instructions. She is only taking the Tradjenta 5 mg BID. She was hesitant to agree to starting Metformin per [facility] d/c summary because she cannot locate the papers herself.   Patient has an appt to see Dr. Damita Dunnings Friday at 12:30 pm. I let the patient know if she needs to begin Metformin prior to Friday appt I would ask a member of your office to contact her.  Thank you, Daneen Schick, BSW, CDP Social Worker, Certified Dementia Practitioner [facility] [phone]

## 2021-12-05 NOTE — Telephone Encounter (Signed)
That is fine and up to the patient, thanks

## 2021-12-05 NOTE — Telephone Encounter (Signed)
No answer at home number. Left a message on voicemail for daughter Eustaquio Maize (in [place]) to call the office back.

## 2021-12-06 ENCOUNTER — Telehealth: Payer: Self-pay

## 2021-12-06 NOTE — Telephone Encounter (Signed)
Morgan Hubbard is calling in from [facility], advising Dr.Tower that the patient requested to move back [place] to 7/11.

## 2021-12-06 NOTE — Telephone Encounter (Signed)
Thanks for the heads up , please give an order if needed and I will sign paperwork when I am back in the office next week

## 2021-12-07 ENCOUNTER — Ambulatory Visit (INDEPENDENT_AMBULATORY_CARE_PROVIDER_SITE_OTHER): Payer: Medicare HMO | Admitting: Family Medicine

## 2021-12-07 ENCOUNTER — Encounter: Payer: Self-pay | Admitting: Family Medicine

## 2021-12-07 VITALS — BP 160/62 | HR 75 | Temp 97.8°F | Ht 61.0 in | Wt 137.0 lb

## 2021-12-07 DIAGNOSIS — I10 Essential (primary) hypertension: Secondary | ICD-10-CM | POA: Diagnosis not present

## 2021-12-07 DIAGNOSIS — E1129 Type 2 diabetes mellitus with other diabetic kidney complication: Secondary | ICD-10-CM

## 2021-12-07 MED ORDER — TRAMADOL HCL 50 MG PO TABS: 50.0000 mg | ORAL_TABLET | Freq: Two times a day (BID) | ORAL | Status: DC | PRN

## 2021-12-07 MED ORDER — GLIMEPIRIDE 1 MG PO TABS: 1.0000 mg | ORAL_TABLET | Freq: Every day | ORAL | 1 refills | Status: DC

## 2021-12-07 NOTE — Telephone Encounter (Signed)
Patient was seen in office today with Dr. Damita Dunnings and was discussed then.

## 2021-12-07 NOTE — Telephone Encounter (Signed)
Vicente Males notified as instructed by telephone and verbalized understanding.

## 2021-12-07 NOTE — Progress Notes (Unsigned)
Inpatient f/u.  I am seeing the patient in place of PCP, who is out of town.  She was admitted with hypotension, elevated sugar, and increased creatinine.  Metformin was held in the meantime.  She is here for follow-up and due for labs.  She feels some better than she did compared to her status during admission.  At home with husband.  Currently off metformin and on linagliptin '5mg'$  a day.  Sugar has been 300s recently.  Occ 400s.    Off losartan HCTZ in the meantime.  She only needed one extra dose of hydralazine in the recent weeks.  Not lightheaded.  She has as needed instructions for hydralazine use.  No sulfa allergy per patient report.    Meds, vitals, and allergies reviewed.   ROS: Per HPI unless specifically indicated in ROS section   GEN: nad, alert and oriented HEENT: ncat NECK: supple w/o LA CV: rrr.  PULM: ctab, no inc wob ABD: soft, +bs EXT: no edema SKIN: no acute rash  30 minutes were devoted to patient care in this encounter (this includes time spent reviewing the patient's file/history, interviewing and examining the patient, counseling/reviewing plan with patient).

## 2021-12-07 NOTE — Patient Instructions (Addendum)
Go to the lab on the way out.   If you have mychart we'll likely use that to update you.    Drink enough water to keep your urine clear or light colored.   Take care.  Glad to see you. I would try restarting glimepiride '1mg'$  with breakfast.  If sugar is still above 200 after 1 week, then increase to 2 mg with breakfast.   Recheck with Dr. [name] in about 2 weeks.

## 2021-12-08 ENCOUNTER — Telehealth: Payer: Self-pay | Admitting: Family Medicine

## 2021-12-08 DIAGNOSIS — E875 Hyperkalemia: Secondary | ICD-10-CM

## 2021-12-08 LAB — CBC WITH DIFFERENTIAL/PLATELET
Absolute Monocytes: 480 cells/uL (ref 200–950)
Basophils Absolute: 19 cells/uL (ref 0–200)
Basophils Relative: 0.3 %
Eosinophils Absolute: 307 cells/uL (ref 15–500)
Eosinophils Relative: 4.8 %
HCT: 34.4 % — ABNORMAL LOW (ref 35.0–45.0)
Hemoglobin: 11 g/dL — ABNORMAL LOW (ref 11.7–15.5)
Lymphs Abs: 1530 cells/uL (ref 850–3900)
MCH: 30.4 pg (ref 27.0–33.0)
MCHC: 32 g/dL (ref 32.0–36.0)
MCV: 95 fL (ref 80.0–100.0)
MPV: 12.9 fL — ABNORMAL HIGH (ref 7.5–12.5)
Monocytes Relative: 7.5 %
Neutro Abs: 4064 cells/uL (ref 1500–7800)
Neutrophils Relative %: 63.5 %
Platelets: 179 10*3/uL (ref 140–400)
RBC: 3.62 10*6/uL — ABNORMAL LOW (ref 3.80–5.10)
RDW: 12.1 % (ref 11.0–15.0)
Total Lymphocyte: 23.9 %
WBC: 6.4 10*3/uL (ref 3.8–10.8)

## 2021-12-08 LAB — BASIC METABOLIC PANEL
BUN/Creatinine Ratio: 15 (calc) (ref 6–22)
BUN: 25 mg/dL (ref 7–25)
CO2: 28 mmol/L (ref 20–32)
Calcium: 8.9 mg/dL (ref 8.6–10.4)
Chloride: 102 mmol/L (ref 98–110)
Creat: 1.63 mg/dL — ABNORMAL HIGH (ref 0.60–0.95)
Glucose, Bld: 414 mg/dL — ABNORMAL HIGH (ref 65–99)
Potassium: 5.9 mmol/L — ABNORMAL HIGH (ref 3.5–5.3)
Sodium: 137 mmol/L (ref 135–146)

## 2021-12-08 NOTE — Telephone Encounter (Signed)
Received call from call service regarding this patients elevated glucose on labs from yesterday. Glucose is 414. It appears her glucose has been running in the 300s and occasionally in to the 400s per Dr Josefine Class note from yesterday. In reviewing the lab results I noted the patients potassium was 5.9 as well. I attempted to call the patient at home and on her cell phone to discuss her labs though there was no answer and I left a message asking her to call back to my office to speak with the on call service so they could relay her to me to discuss her results. I will await her call back and forward this to the ordering provider and her PCP.

## 2021-12-09 MED ORDER — SODIUM ZIRCONIUM CYCLOSILICATE 10 G PO PACK: 10.0000 g | PACK | Freq: Two times a day (BID) | ORAL | 0 refills | Status: DC

## 2021-12-09 NOTE — Assessment & Plan Note (Signed)
Discussed options.  Advised to drink enough water to keep her urine clear or light colored.   Hold metformin for now.  Continue linagliptin.  I would try restarting glimepiride '1mg'$  with breakfast.  If sugar is still above 200 after 1 week, then increase to 2 mg with breakfast.

## 2021-12-09 NOTE — Telephone Encounter (Signed)
Please call patient on Monday.  Her kidney function is slightly better.  Her potassium is elevated.  Her hemoglobin is not normal but it is slightly better.    I would treat her potassium elevation with Sodium zirconium cyclosilicate.  I would take 1 dose twice daily for 1 day.  After she has had the 2 doses, I want to recheck her labs the next day here in clinic.  I put in the order.  Please schedule a lab visit.  She does not need to fast.  Keep drinking plenty water in the meantime.  If she still having elevated sugars above 200 then she can increase glimepiride to 2 mg in the meantime.  Thanks.

## 2021-12-09 NOTE — Assessment & Plan Note (Signed)
Would stay off losartan and hydrochlorothiazide for now.  Continue carvedilol with hydralazine.  She can use an extra hydralazine if her blood pressure is elevated.  Continue isosorbide.  See notes on labs.

## 2021-12-10 DIAGNOSIS — Z79811 Long term (current) use of aromatase inhibitors: Secondary | ICD-10-CM | POA: Diagnosis not present

## 2021-12-10 DIAGNOSIS — Z170 Estrogen receptor positive status [ER+]: Secondary | ICD-10-CM | POA: Diagnosis not present

## 2021-12-10 DIAGNOSIS — Z1379 Encounter for other screening for genetic and chromosomal anomalies: Secondary | ICD-10-CM | POA: Diagnosis not present

## 2021-12-10 DIAGNOSIS — Z950 Presence of cardiac pacemaker: Secondary | ICD-10-CM | POA: Diagnosis not present

## 2021-12-10 DIAGNOSIS — R011 Cardiac murmur, unspecified: Secondary | ICD-10-CM | POA: Diagnosis not present

## 2021-12-10 DIAGNOSIS — Z79899 Other long term (current) drug therapy: Secondary | ICD-10-CM | POA: Diagnosis not present

## 2021-12-10 DIAGNOSIS — Z780 Asymptomatic menopausal state: Secondary | ICD-10-CM | POA: Diagnosis not present

## 2021-12-10 DIAGNOSIS — M549 Dorsalgia, unspecified: Secondary | ICD-10-CM | POA: Diagnosis not present

## 2021-12-10 DIAGNOSIS — Z9049 Acquired absence of other specified parts of digestive tract: Secondary | ICD-10-CM | POA: Diagnosis not present

## 2021-12-10 DIAGNOSIS — R5383 Other fatigue: Secondary | ICD-10-CM | POA: Diagnosis not present

## 2021-12-10 DIAGNOSIS — Z9189 Other specified personal risk factors, not elsewhere classified: Secondary | ICD-10-CM | POA: Diagnosis not present

## 2021-12-10 DIAGNOSIS — I10 Essential (primary) hypertension: Secondary | ICD-10-CM | POA: Diagnosis not present

## 2021-12-10 DIAGNOSIS — Z905 Acquired absence of kidney: Secondary | ICD-10-CM | POA: Diagnosis not present

## 2021-12-10 DIAGNOSIS — Z853 Personal history of malignant neoplasm of breast: Secondary | ICD-10-CM | POA: Diagnosis not present

## 2021-12-10 DIAGNOSIS — Z1231 Encounter for screening mammogram for malignant neoplasm of breast: Secondary | ICD-10-CM | POA: Diagnosis not present

## 2021-12-10 DIAGNOSIS — Z08 Encounter for follow-up examination after completed treatment for malignant neoplasm: Secondary | ICD-10-CM | POA: Diagnosis not present

## 2021-12-10 DIAGNOSIS — C50311 Malignant neoplasm of lower-inner quadrant of right female breast: Secondary | ICD-10-CM | POA: Diagnosis not present

## 2021-12-10 DIAGNOSIS — M810 Age-related osteoporosis without current pathological fracture: Secondary | ICD-10-CM | POA: Diagnosis not present

## 2021-12-10 MED ORDER — SODIUM ZIRCONIUM CYCLOSILICATE 10 G PO PACK: 10.0000 g | PACK | Freq: Two times a day (BID) | ORAL | 0 refills | Status: DC

## 2021-12-10 NOTE — Telephone Encounter (Signed)
Per info in this phone note Dr Damita Dunnings has already seen note from Dr Caryl Bis and Janett Billow CMA has already spoken with pt. Sending note to Dr [name] and Rexene Agent CMA as Juluis Rainier

## 2021-12-10 NOTE — Telephone Encounter (Signed)
I sent the lokelma again in case it did not go the first time   Please schedule labs Thanks

## 2021-12-10 NOTE — Telephone Encounter (Signed)
Left message on voicemail for patient to call the office back. 

## 2021-12-10 NOTE — Telephone Encounter (Signed)
Patient returned phone call. °

## 2021-12-10 NOTE — Telephone Encounter (Signed)
Patient notified as instructed by telephone and verbalized understanding. Patient stated that she will have her husband pick up the medication in the morning and take it. Lab appointment scheduled 12/12/21. Follow-up appointment scheduled with Dr. [name] 12/17/21.

## 2021-12-10 NOTE — Addendum Note (Signed)
Addended by: [name] A on: 12/10/2021 05:15 PM   Modules accepted: Orders

## 2021-12-10 NOTE — Telephone Encounter (Signed)
[place] Night - Client TELEPHONE ADVICE RECORD [facility] Patient Name: Morgan SIMONIAN MS Gender: Female DOB: Feb 26, 1935 Age: 87 Y Return Phone Number: Address: City/ State/ Zip: [place] Client [place] Night - Client Client Site [place] Provider AA - PHYSICIAN, NOT LISTED- MD Contact Type Call Who Is Calling Lab / Radiology Lab Name [facility] Lab Phone Number [phone] Lab Tech Name Noberto Retort Reference Number [id number] W Chief Complaint Lab Result (Critical or Stat) Call Type Lab Send to RN Reason for Call Report lab results Initial Comment Caller States she is calling with and urgent value. Translation No Nurse Assessment Nurse: Eugenio Hoes, RN, Sarah Date/Time Eilene Ghazi Time): 12/08/2021 3:20:22 PM Is there an on-call provider listed? ---Yes Please list name of person reporting value (Lab Employee) and a contact number. ---[phone] Lovena Le Please document the following items: Lab name Lab value (read back to lab to verify) Reference range for lab value Date and time blood was drawn Collect time of birth for bilirubin results ---Lab Name: Glucose Lab Value: 414 Reference Range: 65-99 Date and Time of Collection: 12/07/2021 1:37pm Please collect the patient contact information from the lab. (name, phone number and address) ---Graciela Husbands [phone] List any special notes provided by lab. ---Ordering provider Elsie Stain Disp. Time Eilene Ghazi Time) Disposition Final User 12/08/2021 3:27:51 PM Called On-Call Provider Eugenio Hoes, RN, Sarah 12/08/2021 3:30:07 PM Clinical Call Yes Eugenio Hoes, RN, Sarah Final Disposition 12/08/2021 3:30:07 PM Clinical Call Yes Eugenio Hoes, RN, Daryl [facility] NOTE: All timestamps contained within this report are represented as [place] Standard Time. CONFIDENTIALTY NOTICE: This fax transmission is intended only for the addressee. It contains information that is legally privileged,  confidential or otherwise protected from use or disclosure. If you are not the intended recipient, you are strictly prohibited from reviewing, disclosing, copying using or disseminating any of this information or taking any action in reliance on or regarding this information. If you have received this fax in error, please notify us immediately by telephone so that we can arrange for its return to [place]. Phone: [phone], Toll-Free: [phone], Fax: [phone] Page: 2 of 2 Call Id: [id number] Paging DoctorName Phone DateTime Result/ Outcome Message Type Notes Tommi Rumps - MD [id number] 12/08/2021 3:27:51 PM Called On Call Provider - Reached Doctor Paged Tommi Rumps - MD 12/08/2021 3:29:25 PM Spoke with On Call - General Message Result Notified Dr. Caryl Bis of

## 2021-12-10 NOTE — Telephone Encounter (Signed)
Please make sure she does take the medicine and get labs. Please let her know that the high potassium is dangerous for her heart.   I hope she is starting to feel better   Schedule in office f/u with me in 1-2 weeks as well if able

## 2021-12-10 NOTE — Telephone Encounter (Signed)
Patient notified of lab results and verbalized understanding. Patient is going to pick up the medication today but wont be able to take it until tomorrow as she has appointments today she is going to and wont have time. I asked patient to please call back to schedule her repeat labs once she has taken the medication for the next day and patient verbalized understanding and will do so.

## 2021-12-10 NOTE — Telephone Encounter (Signed)
Patient notified as instructed by telephone. Patient stated that she never was able to pick the medication up. Patient stated that they went by [facility] and was advised that they were waiting on an okay from the doctor to fill medication.

## 2021-12-10 NOTE — Telephone Encounter (Signed)
Called and spoke to patient on her cell phone. Patient stated that she is at [facility] and will have to call the office back later.

## 2021-12-11 ENCOUNTER — Telehealth: Payer: Self-pay | Admitting: Family Medicine

## 2021-12-11 DIAGNOSIS — M549 Dorsalgia, unspecified: Secondary | ICD-10-CM | POA: Diagnosis not present

## 2021-12-11 DIAGNOSIS — I083 Combined rheumatic disorders of mitral, aortic and tricuspid valves: Secondary | ICD-10-CM | POA: Diagnosis not present

## 2021-12-11 DIAGNOSIS — N318 Other neuromuscular dysfunction of bladder: Secondary | ICD-10-CM | POA: Diagnosis not present

## 2021-12-11 DIAGNOSIS — Z7984 Long term (current) use of oral hypoglycemic drugs: Secondary | ICD-10-CM | POA: Diagnosis not present

## 2021-12-11 DIAGNOSIS — Z95810 Presence of automatic (implantable) cardiac defibrillator: Secondary | ICD-10-CM | POA: Diagnosis not present

## 2021-12-11 DIAGNOSIS — M6281 Muscle weakness (generalized): Secondary | ICD-10-CM | POA: Diagnosis not present

## 2021-12-11 DIAGNOSIS — M8580 Other specified disorders of bone density and structure, unspecified site: Secondary | ICD-10-CM | POA: Diagnosis not present

## 2021-12-11 DIAGNOSIS — M48061 Spinal stenosis, lumbar region without neurogenic claudication: Secondary | ICD-10-CM | POA: Diagnosis not present

## 2021-12-11 DIAGNOSIS — G8929 Other chronic pain: Secondary | ICD-10-CM | POA: Diagnosis not present

## 2021-12-11 DIAGNOSIS — D631 Anemia in chronic kidney disease: Secondary | ICD-10-CM | POA: Diagnosis not present

## 2021-12-11 DIAGNOSIS — E1169 Type 2 diabetes mellitus with other specified complication: Secondary | ICD-10-CM | POA: Diagnosis not present

## 2021-12-11 DIAGNOSIS — Z9181 History of falling: Secondary | ICD-10-CM | POA: Diagnosis not present

## 2021-12-11 DIAGNOSIS — N184 Chronic kidney disease, stage 4 (severe): Secondary | ICD-10-CM | POA: Diagnosis not present

## 2021-12-11 DIAGNOSIS — E785 Hyperlipidemia, unspecified: Secondary | ICD-10-CM | POA: Diagnosis not present

## 2021-12-11 DIAGNOSIS — I959 Hypotension, unspecified: Secondary | ICD-10-CM | POA: Diagnosis not present

## 2021-12-11 DIAGNOSIS — I110 Hypertensive heart disease with heart failure: Secondary | ICD-10-CM | POA: Diagnosis not present

## 2021-12-11 DIAGNOSIS — I5022 Chronic systolic (congestive) heart failure: Secondary | ICD-10-CM | POA: Diagnosis not present

## 2021-12-11 DIAGNOSIS — E1142 Type 2 diabetes mellitus with diabetic polyneuropathy: Secondary | ICD-10-CM | POA: Diagnosis not present

## 2021-12-11 DIAGNOSIS — Z794 Long term (current) use of insulin: Secondary | ICD-10-CM | POA: Diagnosis not present

## 2021-12-11 DIAGNOSIS — N3946 Mixed incontinence: Secondary | ICD-10-CM | POA: Diagnosis not present

## 2021-12-11 DIAGNOSIS — E1122 Type 2 diabetes mellitus with diabetic chronic kidney disease: Secondary | ICD-10-CM | POA: Diagnosis not present

## 2021-12-11 DIAGNOSIS — E875 Hyperkalemia: Secondary | ICD-10-CM | POA: Diagnosis not present

## 2021-12-11 DIAGNOSIS — I429 Cardiomyopathy, unspecified: Secondary | ICD-10-CM | POA: Diagnosis not present

## 2021-12-11 DIAGNOSIS — N3281 Overactive bladder: Secondary | ICD-10-CM | POA: Diagnosis not present

## 2021-12-11 NOTE — Telephone Encounter (Signed)
Please ok those verbal orders  

## 2021-12-11 NOTE — Telephone Encounter (Signed)
Home Health verbal orders Caller Name: [place] Name: [place] number: [phone]  Requesting OT/PT/Skilled nursing/Social Work/Speech:  Reason:PT  Frequency:1 week 1, 2 week 3, 1 week 3  Please forward to [facility] pool or providers CMA

## 2021-12-11 NOTE — Telephone Encounter (Signed)
Is this okay?

## 2021-12-12 ENCOUNTER — Other Ambulatory Visit: Payer: Self-pay | Admitting: Family Medicine

## 2021-12-12 ENCOUNTER — Other Ambulatory Visit (INDEPENDENT_AMBULATORY_CARE_PROVIDER_SITE_OTHER): Payer: Medicare HMO

## 2021-12-12 ENCOUNTER — Other Ambulatory Visit: Payer: Medicare HMO

## 2021-12-12 DIAGNOSIS — E875 Hyperkalemia: Secondary | ICD-10-CM | POA: Diagnosis not present

## 2021-12-12 LAB — BASIC METABOLIC PANEL
BUN: 21 mg/dL (ref 6–23)
CO2: 32 mEq/L (ref 19–32)
Calcium: 8.9 mg/dL (ref 8.4–10.5)
Chloride: 103 mEq/L (ref 96–112)
Creatinine, Ser: 1.42 mg/dL — ABNORMAL HIGH (ref 0.40–1.20)
GFR: 33.84 mL/min — ABNORMAL LOW (ref >60.00)
Glucose, Bld: 198 mg/dL — ABNORMAL HIGH (ref 70–99)
Potassium: 5.4 mEq/L — ABNORMAL HIGH (ref 3.5–5.1)
Sodium: 139 mEq/L (ref 135–145)

## 2021-12-12 MED ORDER — SODIUM ZIRCONIUM CYCLOSILICATE 10 G PO PACK: 10.0000 g | PACK | Freq: Two times a day (BID) | ORAL | 0 refills | Status: AC

## 2021-12-12 NOTE — Telephone Encounter (Signed)
Gave verbal for PT for pt to [facility]

## 2021-12-14 ENCOUNTER — Telehealth: Payer: Self-pay

## 2021-12-14 NOTE — Chronic Care Management (AMB) (Signed)
Chronic Care Management Pharmacy Assistant   Name: Morgan Hubbard  MRN: [id number] DOB: 02-12-1935   Reason for Encounter: Hypertension Disease State  Recent office visits:  12/17/21-[name],MD(PCP)-f/u dehydration,labs ordered( potassium back normal,glucose 324,kidney stable, recheck BMP 2 weeks )Currently holding metformin due to AKI /renal insuff.Start PT  in home.no medication changes  12/07/21-Graham Duncan,MD(fam med)-hospital f/u-Currently off metformin and on linagliptin '5mg'$  a day.  Sugar has been 300s recently.  Occ 400s.-Would stay off losartan and hydrochlorothiazide for now. Labs ordered( no MD note) 11/05/21-[name],MD(PCP)- f/u nausea,Adding pepcid 20 mg twice daily, labs. 09/19/21-Spencer Copland,MD(fam med)-left shoulder pain, rest for 2 weeks, no medication changes  07/16/21-Spencer Copland,MD(fam med)-right shoulder pain,xrays-combined intra-articular and subacromial injection today.  Recent consult visits:  12/10/21-Gretchen Kimmick,MD([facility])-Recent weight loss is a concern. We will arrange labs and repeat PET scan. Mammogram done,recommend calcium and vit.D, no medication changes  11/06/21-Anna Solum,MD(endo)-f/u DM,stop flimepiride, start tradjenta '5mg'$  daily,f/u 3 months with labs   07/27/21-Anna Solum,MD(endo) f/u DM, Add Trulicity 5.78 mg weekly.f/u 3 months  Hospital visits:  11/29/21-[facility] ED-hypotension, IV fluids--Add a small dose of Lantus 10 units daily,observation, no admission   Medications: Outpatient Encounter Medications as of 12/14/2021  Medication Sig   aspirin EC 81 MG tablet Take 1 tablet (81 mg total) by mouth daily. Swallow whole.   atorvastatin (LIPITOR) 80 MG tablet Take by mouth.   carvedilol (COREG) 12.5 MG tablet Take 1 tablet (12.5 mg total) by mouth 2 (two) times daily with a meal.   cetirizine (ZYRTEC) 10 MG tablet Take 10 mg by mouth daily.   clotrimazole-betamethasone (LOTRISONE) cream Apply 1 application topically as needed  (groin).    famotidine (PEPCID) 20 MG tablet Take 1 tablet (20 mg total) by mouth 2 (two) times daily.   gabapentin (NEURONTIN) 300 MG capsule Take 1 capsule (300 mg total) by mouth 3 (three) times daily.   glimepiride (AMARYL) 1 MG tablet Take 1-2 tablets (1-2 mg total) by mouth daily with breakfast.   hydrALAZINE (APRESOLINE) 50 MG tablet Take 1 tablet (50 mg total) by mouth 2 (two) times daily. Take an extra hydralazine 50 mg as needed for pressure >170   isosorbide mononitrate (IMDUR) 60 MG 24 hr tablet Take 1 tablet (60 mg total) by mouth in the morning and at bedtime.   letrozole (FEMARA) 2.5 MG tablet Take 2.5 mg by mouth daily.   linagliptin (TRADJENTA) 5 MG TABS tablet Take 5 mg by mouth daily.   metFORMIN (GLUCOPHAGE) 1000 MG tablet Held as of 12/05/21   mirabegron ER ([name]) 50 MG TB24 tablet Take 1 tablet (50 mg total) by mouth at bedtime.   nystatin cream (MYCOSTATIN) Apply 1 application topically 2 (two) times daily as needed (irritation.).    pantoprazole (PROTONIX) 40 MG tablet Take 1 tablet by mouth once daily   traMADol (ULTRAM) 50 MG tablet Take 1 tablet (50 mg total) by mouth every 12 (twelve) hours as needed.   vitamin B-12 (CYANOCOBALAMIN) 1000 MCG tablet Take 1,000 mcg by mouth daily.    Vitamin D, Ergocalciferol, (DRISDOL) 1.25 MG (50000 UT) CAPS capsule Take 50,000 Units by mouth every 7 (seven) days.   No facility-administered encounter medications on file as of 12/14/2021.   Recent Office Vitals: BP Readings from Last 3 Encounters:  12/07/21 (!) 160/62  11/30/21 (!) 143/65  11/28/21 (!) 160/64   Pulse Readings from Last 3 Encounters:  12/07/21 75  11/30/21 60  11/28/21 63    Wt Readings from  Last 3 Encounters:  12/07/21 137 lb (62.1 kg)  11/30/21 137 lb 2 oz (62.2 kg)  11/28/21 136 lb 12.8 oz (62.1 kg)     Kidney Function Lab Results  Component Value Date/Time   CREATININE 1.42 (H) 12/12/2021 09:01 AM   CREATININE 1.63 (H) 12/07/2021 01:37 PM    CREATININE 1.75 (H) 11/30/2021 02:22 AM   GFR 33.84 (L) 12/12/2021 09:01 AM   GFRNONAA 28 (L) 11/30/2021 02:22 AM   GFRAA 41 (L) 06/15/2018 04:26 AM       Latest Ref Rng & Units 12/12/2021    9:01 AM 12/07/2021    1:37 PM 11/30/2021    2:22 AM  BMP  Glucose 70 - 99 mg/dL 198  414  139   BUN 6 - 23 mg/dL '21  25  27   '$ Creatinine 0.40 - 1.20 mg/dL 1.42  1.63  1.75   BUN/Creat Ratio 6 - 22 (calc)  15    Sodium 135 - 145 mEq/L 139  137  139   Potassium 3.5 - 5.1 mEq/L 5.4 No hemolysis seen  5.9  4.1   Chloride 96 - 112 mEq/L 103  102  105   CO2 19 - 32 mEq/L 32  28  24   Calcium 8.4 - 10.5 mg/dL 8.9  8.9  8.8      Contacted patient on 12/18/21 to discuss hypertension disease state  Current antihypertensive regimen:   Imdur '60mg'$   1 tablet 2 times daily   Coreg 12.'5mg'$  1 tablet 2 times daily   Hydralazine '50mg'$   1 tablet 2 times daily    Patient verbally confirms she is taking the above medications as directed. Yes  How often are you checking your Blood Pressure? daily  she checks her blood pressure at different times during the day anywhere from 7 am to 10pm   Current home BP readings:   DATE:             BP               PULSE  12/02/21  154/72  12/04/21  172/74  12/08/21  163/68  12/11/21 180/83  12/14/21 191/87  12/18/21 169/77      Wrist or arm cuff:arm cuff  Caffeine intake: rare intake  Salt intake:limits with adding to food  Over the counter medications including pseudoephedrine or NSAIDs?Patient reports she will take  325 mg asa for HA  Any readings above 180/120? Yes If yes any symptoms of hypertensive emergency? patient denies any symptoms of high blood pressure   What recent interventions/DTPs have been made by any provider to improve Blood Pressure control since last CPP Visit:   Patient is monitoring BP daily and  making a log.Patient is increasing water intake.  Any recent hospitalizations or ED visits since last visit with CPP? Yes  11/29/21- hypotension- no  admission  11/19/21-[facility]-- Increased Coreg to '25mg'$  BID - Discontinued losartan-HCTZ - Discontinued Plavix  - Held metformin on discharge - Removed Farxiga from medication list, patient cannot afford  11/16/21-[facility]-Hypertension- no medication changes, f/u cardiology in 1 week -no admission   What diet changes have been made to improve Blood Pressure Control?  Patient reports increasing fluid intake, limits adding salt, choosing vegetables and grilled meats for meals  What exercise is being done to improve your Blood Pressure Control?   Patient is limited doing formal exercise due to weakness from dehydration, PT to start in home soon.  Adherence Review: Is the patient currently  on ACE/ARB medication? No Does the patient have >5 day gap between last estimated fill dates? No   Star Rating Drugs:  Medication:  Last Fill: Day Supply Atorvastatin '80mg'$  09/28/21 90 Glimepiride '1mg'$  12/07/21  30 Tradjenta '5mg'$   11/06/21  90 Metformin '1000mg'$  09/19/21 90    Care Gaps: Annual wellness visit in last year? Yes Most Recent BP reading:142/80  71-P  12/17/21  If Diabetic: Most recent A1C reading:8.0  11/29/21 Last eye exam / retinopathy screening:overdue Last diabetic foot exam: overdue   Upcoming appointments: PCP appointment on 12/17/21 and Cardiology appointment on 12/25/21    [name], CPP notified  [name], [place]  [phone]

## 2021-12-17 ENCOUNTER — Ambulatory Visit (INDEPENDENT_AMBULATORY_CARE_PROVIDER_SITE_OTHER): Payer: Medicare HMO | Admitting: Family Medicine

## 2021-12-17 ENCOUNTER — Encounter: Payer: Self-pay | Admitting: Family Medicine

## 2021-12-17 VITALS — BP 142/80 | HR 71 | Ht 61.0 in | Wt 140.8 lb

## 2021-12-17 DIAGNOSIS — R110 Nausea: Secondary | ICD-10-CM

## 2021-12-17 DIAGNOSIS — R55 Syncope and collapse: Secondary | ICD-10-CM | POA: Diagnosis not present

## 2021-12-17 DIAGNOSIS — N289 Disorder of kidney and ureter, unspecified: Secondary | ICD-10-CM | POA: Diagnosis not present

## 2021-12-17 DIAGNOSIS — E875 Hyperkalemia: Secondary | ICD-10-CM

## 2021-12-17 DIAGNOSIS — I428 Other cardiomyopathies: Secondary | ICD-10-CM

## 2021-12-17 DIAGNOSIS — N179 Acute kidney failure, unspecified: Secondary | ICD-10-CM | POA: Diagnosis not present

## 2021-12-17 DIAGNOSIS — I10 Essential (primary) hypertension: Secondary | ICD-10-CM | POA: Diagnosis not present

## 2021-12-17 DIAGNOSIS — E1129 Type 2 diabetes mellitus with other diabetic kidney complication: Secondary | ICD-10-CM

## 2021-12-17 LAB — BASIC METABOLIC PANEL
BUN: 27 mg/dL — ABNORMAL HIGH (ref 6–23)
CO2: 31 mEq/L (ref 19–32)
Calcium: 9 mg/dL (ref 8.4–10.5)
Chloride: 102 mEq/L (ref 96–112)
Creatinine, Ser: 1.39 mg/dL — ABNORMAL HIGH (ref 0.40–1.20)
GFR: 34.72 mL/min — ABNORMAL LOW (ref >60.00)
Glucose, Bld: 324 mg/dL — ABNORMAL HIGH (ref 70–99)
Potassium: 4.9 mEq/L (ref 3.5–5.1)
Sodium: 139 mEq/L (ref 135–145)

## 2021-12-17 NOTE — Assessment & Plan Note (Signed)
S/p 2 hosp for dehydration and presyncope Reviewed hospital records, lab results and studies in detail    bmet today  Has had 2 courses of lokelma for elevated K  Feels better  Holding metformin and losartan and hCTz-inst to continue holding Will need f/u with her nephrologist

## 2021-12-17 NOTE — Assessment & Plan Note (Signed)
Resolved s/p 2 hosp for dehydration  Is improved but still weak and wobbly  Strongly enc to work on fluid intake (she is hesitant) Plans to start home PT soon

## 2021-12-17 NOTE — Assessment & Plan Note (Signed)
bp is holding fairly steady without hctz and losartan (for AKI)  bp in fair control at this time  BP Readings from Last 1 Encounters:  12/17/21 (!) 142/80   No changes needed Most recent labs reviewed  Disc lifstyle change with low sodium diet and exercise  Plan to continue Carvedilol 12.5 mg bid  imdur 60 mg bid Hydralazine 50 mg bid   Lab today for bmet

## 2021-12-17 NOTE — Patient Instructions (Addendum)
Focus on fluid intake the best you can Better hydration will make you feel so much better    If nausea returns let us know   Continue current medicines   If your kidney function is not improved we will have you follow up with nephrology   Let Dr Gabriel Carina know you are holding your metformin and that your sugar levels are too high   Take care of yourself  Get started with PT as planned   I want you to start eating regular meals  If you need to supplement with some glucerna that is ok (is basically like ensure for diabetics)  but don't depend on it

## 2021-12-17 NOTE — Assessment & Plan Note (Signed)
For f/u with cardiology soon  Baseline bp is up holding hctz and losartan No severe swelling and no sob

## 2021-12-17 NOTE — Assessment & Plan Note (Signed)
She has had 2 courses of lokelma  bmet today  Suspect due to AKI No symptoms  Last K was 5.4

## 2021-12-17 NOTE — Assessment & Plan Note (Signed)
Currently holding metformin due to AKI /renal insuff Glucose is running 200-300  Appetite fair /would rather have protein shake than food   inst pt to call her endocrinologist for more assistance  Continues glimeperide and generic tradjenta  May need insulin in the future

## 2021-12-17 NOTE — Assessment & Plan Note (Signed)
With recent AKI from dehydration  Long discussion re: hydration goals  She is hesitant to drink because it makes her urinate  Has a nephrologist

## 2021-12-17 NOTE — Assessment & Plan Note (Signed)
Much improved on pepcid 20 mg bid   Appetite is still not 100% but can take in food and fluids

## 2021-12-17 NOTE — Progress Notes (Signed)
Subjective:    Patient ID: Morgan Hubbard, female    DOB: 12/07/1934, 87 y.o.   MRN: [id number]  HPI Here for f/u of HTN and DM with recent hospitalization for dehydration   Wt Readings from Last 3 Encounters:  12/17/21 140 lb 12.8 oz (63.9 kg)  12/07/21 137 lb (62.1 kg)  11/30/21 137 lb 2 oz (62.2 kg)   26.60 kg/m  Starting to feel better Not 100%   Did go out of town- [place] and had a low key weekend    Waiting for the PT to call and get started at her home  Still a bit wobbly and weak after 2 hospital admissions   Appetite is a lot better but not eating heavy  Not much fluids - tried really hard to get in more fluids  It makes her urinate    HTN bp is stable today  No cp or palpitations or headaches or edema  No side effects to medicines  BP Readings from Last 3 Encounters:  12/17/21 (!) 142/80  12/07/21 (!) 160/62  11/30/21 (!) 143/65    Holding hctz and losartan   Taking Carvediolol  Hydralazine  Isosorbide  Pulse Readings from Last 3 Encounters:  12/17/21 71  12/07/21 75  11/30/21 60     Lab Results  Component Value Date   CREATININE 1.42 (H) 12/12/2021   BUN 21 12/12/2021   NA 139 12/12/2021   K 5.4 No hemolysis seen (H) 12/12/2021   CL 103 12/12/2021   CO2 32 12/12/2021   Took lokelma twice for elevated K   Needs labs   Lab Results  Component Value Date   WBC 6.4 12/07/2021   HGB 11.0 (L) 12/07/2021   HCT 34.4 (L) 12/07/2021   MCV 95.0 12/07/2021   PLT 179 12/07/2021     DM2 Lab Results  Component Value Date   HGBA1C 8.0 (H) 11/29/2021   Holding metformin due to renal insuff  Linagliptin Glimiperide 1 mg with brkast - can inc to 2 if glucose is over 200  Sees nephrology soon  Sees Dr Candis Musa next week   Patient Active Problem List   Diagnosis Date Noted   Near syncope 11/29/2021   Hypotension 11/29/2021   Generalized weakness 11/28/2021   Encounter for medication review 11/28/2021   Nausea 11/05/2021   Shoulder pain  04/25/2021   History of breast cancer 06/18/2020   Anemia 06/16/2020   Elevated TSH 06/16/2020   Occipital neuralgia 02/14/2020   Hair loss 11/09/2018   Leg weakness, bilateral 06/29/2018   Pedal edema 06/22/2018   AICD (automatic cardioverter/defibrillator) present 06/11/2018   GERD (gastroesophageal reflux disease) 06/10/2018   AKI (acute kidney injury) ([place]) 06/10/2018   Cholecystitis 06/10/2018   Tingling 10/06/2017   Chronic back pain 78/93/8101   Chronic systolic (congestive) heart failure ([place]) 02/25/2017   Osteopenia 06/16/2016   Routine general medical examination at a health care facility 04/24/2016   Estrogen deficiency 04/24/2016   Urticaria 11/03/2015   Encounter for Medicare annual wellness exam 06/13/2014   Stroke, small vessel ([place]) 01/18/2014   Family history of hemochromatosis 01/18/2014   Cerebral thrombosis with cerebral infarction ([place]) 01/14/2014   Expressive aphasia 01/13/2014   Nonischemic cardiomyopathy ([place]) 04/05/2013   Shortness of breath 09/03/2012   Elevated transaminase level 05/18/2012   Spinal stenosis of lumbar region 12/10/2011   Mixed incontinence urge and stress 12/10/2011   Renal insufficiency 11/04/2011   Goiter 01/14/2011   Fatty liver 08/28/2010   Type  II diabetes mellitus with renal manifestations ([place]) 07/09/2010   Hyperlipidemia associated with type 2 diabetes mellitus ([place]) 07/09/2010   Hyperkalemia 07/09/2010   REFLEX SYMPATHETIC DYSTROPHY 07/09/2010   Neuropathy 07/09/2010   Essential hypertension 07/09/2010   CARDIOMYOPATHY 07/09/2010   OVERACTIVE BLADDER 07/09/2010   Past Medical History:  Diagnosis Date   Arthritis    "hands" (01/13/2014)   Basal cell carcinoma 01/2014   "bridge of nose"   Cardiac LV ejection fraction >40%    "it was 43 last year" (01/13/2014)   Chronic lower back pain    CKD (chronic kidney disease), stage III ([place])    acute on chronic stage III/notes 06/10/2018   Colon polyps    Expressive aphasia     "3 times in the last week" (01/13/2014)   GERD (gastroesophageal reflux disease)    Goiter past remote   treated with RI   Hyperlipidemia    Hyperpotassemia    Hypertension    Hypertonicity of bladder    Inflammatory and toxic neuropathy, unspecified    LBBB (left bundle branch block)    Migraine    "stopped many years ago" (01/13/2014)   Mini stroke 01/2014   Other primary cardiomyopathies    Overactive bladder    Presence of combination internal cardiac defibrillator (ICD) and pacemaker    Reflex sympathetic dystrophy, unspecified    Shingles    Shortness of breath    ambulation   Stroke ([place])    Thyroid disease    Type II diabetes mellitus ([place])    Ulcer    Urine incontinence    Vertigo    hx of   Past Surgical History:  Procedure Laterality Date   BACK SURGERY     BI-VENTRICULAR PACEMAKER INSERTION (CRT-P)  10/2014   [facility]   BREAST CYST EXCISION Left 1959   CARDIAC CATHETERIZATION  "several"   [place]   CATARACT EXTRACTION W/ INTRAOCULAR LENS IMPLANT Left ~ 2005   several eye injections   CATARACT EXTRACTION W/PHACO Right 05/04/2020   Procedure: CATARACT EXTRACTION PHACO AND INTRAOCULAR LENS PLACEMENT ([place]) RIGHT DIABETIC;  Surgeon: Birder Robson, MD;  Location: [facility];  Service: Ophthalmology;  Laterality: Right;  [place] 00:58.5 CDE 6.70 Fluid Pack [place] # [id number]   CHOLECYSTECTOMY N/A 06/13/2018   Procedure: LAPAROSCOPIC CHOLECYSTECTOMY;  Surgeon: Donnie Mesa, MD;  Location: [place];  Service: General;  Laterality: N/A;   COLONOSCOPY  7/12   normal (hx of polyps in past)    CT SCAN  3/12   outside hosp- lung nodule and gallstones   ERCP N/A 06/12/2018   Procedure: ENDOSCOPIC RETROGRADE CHOLANGIOPANCREATOGRAPHY (ERCP);  Surgeon: Carol Ada, MD;  Location: [place];  Service: Endoscopy;  Laterality: N/A;   [place]   "knot on index"   KIDNEY DONATION Left 1989   LUMBAR LAMINECTOMY/DECOMPRESSION MICRODISCECTOMY  1999   LUMBAR  LAMINECTOMY/DECOMPRESSION MICRODISCECTOMY  01/31/2012   Procedure: LUMBAR LAMINECTOMY/DECOMPRESSION MICRODISCECTOMY 2 LEVELS;  Surgeon: Eustace Moore, MD;  Location: [place] [facility];  Service: Neurosurgery;  Laterality: N/A;  Thoracic twelve-lumbar one, lumbar one-two laminectomy    POSTERIOR LAMINECTOMY / [place]   REMOVAL OF STONES  06/12/2018   Procedure: REMOVAL OF STONES;  Surgeon: Carol Ada, MD;  Location[facility];  Service: Endoscopy;;   SPHINCTEROTOMY  06/12/2018   Procedure: Joan Mayans;  Surgeon: Carol Ada, MD;  Location: [place];  Service: Endoscopy;;   TUBAL LIGATION  1976   UPPER GASTROINTESTINAL ENDOSCOPY  Social History   Tobacco Use   Smoking status: Never   Smokeless tobacco: Never  Vaping Use   Vaping Use: Never used  Substance Use Topics   Alcohol use: No    Alcohol/week: 0.0 standard drinks of alcohol   Drug use: No   Family History  Problem Relation Age of Onset   Arthritis Mother    Cancer Mother        uterine and mouth   Hyperlipidemia Mother    Stroke Mother    Hypertension Mother    Alcohol abuse Father    Diabetes Father    Cancer Sister        breast   Diabetes Sister    Cancer Brother        lung cancer   Kidney disease Brother    Diabetes Brother    Hyperlipidemia Sister    Heart disease Sister    Hypertension Sister    Diabetes Sister    Hyperlipidemia Brother    Kidney failure Brother    Diabetes Daughter    Addison's disease Other    Allergies  Allergen Reactions   Ace Inhibitors Swelling    REACTION: tongue swelling   Codeine Nausea Only   Hydrochlorothiazide Other (See Comments)    Dizziness/funny feeling   Insulin Detemir Itching   Lisinopril Swelling    Swelling of tongue   Nsaids Other (See Comments)    Renal issues   Tylenol [Acetaminophen] Other (See Comments)    Fatty liver   Current Outpatient Medications on File Prior to Visit  Medication Sig Dispense Refill    aspirin EC 81 MG tablet Take 1 tablet (81 mg total) by mouth daily. Swallow whole. 90 tablet 3   atorvastatin (LIPITOR) 80 MG tablet Take by mouth.     carvedilol (COREG) 12.5 MG tablet Take 1 tablet (12.5 mg total) by mouth 2 (two) times daily with a meal. 180 tablet 3   cetirizine (ZYRTEC) 10 MG tablet Take 10 mg by mouth daily.     clotrimazole-betamethasone (LOTRISONE) cream Apply 1 application topically as needed (groin).      famotidine (PEPCID) 20 MG tablet Take 1 tablet (20 mg total) by mouth 2 (two) times daily. 60 tablet 1   gabapentin (NEURONTIN) 300 MG capsule Take 1 capsule (300 mg total) by mouth 3 (three) times daily. 270 capsule 3   glimepiride (AMARYL) 1 MG tablet Take 1-2 tablets (1-2 mg total) by mouth daily with breakfast. 60 tablet 1   hydrALAZINE (APRESOLINE) 50 MG tablet Take 1 tablet (50 mg total) by mouth 2 (two) times daily. Take an extra hydralazine 50 mg as needed for pressure >170 100 tablet 3   letrozole (FEMARA) 2.5 MG tablet Take 2.5 mg by mouth daily.     linagliptin (TRADJENTA) 5 MG TABS tablet Take 5 mg by mouth daily.     mirabegron ER ([name]) 50 MG TB24 tablet Take 1 tablet (50 mg total) by mouth at bedtime. 90 tablet 3   nystatin cream (MYCOSTATIN) Apply 1 application topically 2 (two) times daily as needed (irritation.).      pantoprazole (PROTONIX) 40 MG tablet Take 1 tablet by mouth once daily 90 tablet 1   traMADol (ULTRAM) 50 MG tablet Take 1 tablet (50 mg total) by mouth every 12 (twelve) hours as needed.     vitamin B-12 (CYANOCOBALAMIN) 1000 MCG tablet Take 1,000 mcg by mouth daily.      Vitamin D, Ergocalciferol, (DRISDOL) 1.25 MG (50000 UT) CAPS  capsule Take 50,000 Units by mouth every 7 (seven) days.     isosorbide mononitrate (IMDUR) 60 MG 24 hr tablet Take 1 tablet (60 mg total) by mouth in the morning and at bedtime. 90 tablet 3   metFORMIN (GLUCOPHAGE) 1000 MG tablet Held as of 12/05/21 (Patient not taking: Reported on 12/17/2021)     No  current facility-administered medications on file prior to visit.    Review of Systems  Constitutional:  Positive for fatigue. Negative for activity change, appetite change, fever and unexpected weight change.  HENT:  Negative for congestion, ear pain, rhinorrhea, sinus pressure and sore throat.   Eyes:  Negative for pain, redness and visual disturbance.  Respiratory:  Negative for cough, shortness of breath and wheezing.   Cardiovascular:  Negative for chest pain and palpitations.  Gastrointestinal:  Negative for abdominal pain, blood in stool, constipation and diarrhea.  Endocrine: Negative for polydipsia and polyuria.  Genitourinary:  Negative for dysuria, frequency and urgency.  Musculoskeletal:  Negative for arthralgias, back pain and myalgias.  Skin:  Negative for pallor and rash.  Allergic/Immunologic: Negative for environmental allergies.  Neurological:  Negative for dizziness, syncope and headaches.       Balance is not 100%   Hematological:  Negative for adenopathy. Does not bruise/bleed easily.  Psychiatric/Behavioral:  Negative for decreased concentration and dysphoric mood. The patient is not nervous/anxious.        Objective:   Physical Exam Constitutional:      General: She is not in acute distress.    Appearance: Normal appearance. She is well-developed and normal weight. She is not ill-appearing or diaphoretic.  HENT:     Head: Normocephalic and atraumatic.     Mouth/Throat:     Pharynx: No posterior oropharyngeal erythema.     Comments: MM are borderline dry  Eyes:     Conjunctiva/sclera: Conjunctivae normal.     Pupils: Pupils are equal, round, and reactive to light.  Neck:     Thyroid: No thyromegaly.     Vascular: No carotid bruit or JVD.  Cardiovascular:     Rate and Rhythm: Normal rate and regular rhythm.     Heart sounds: Normal heart sounds.     No gallop.  Pulmonary:     Effort: Pulmonary effort is normal. No respiratory distress.     Breath  sounds: Normal breath sounds. No wheezing or rales.  Abdominal:     General: There is no distension or abdominal bruit.     Palpations: Abdomen is soft. There is no mass.     Tenderness: There is no abdominal tenderness. There is no guarding or rebound.     Hernia: No hernia is present.  Musculoskeletal:     Cervical back: Normal range of motion and neck supple.     Right lower leg: Edema present.     Left lower leg: Edema present.     Comments: Trace pedal edema   Lymphadenopathy:     Cervical: No cervical adenopathy.  Skin:    General: Skin is warm and dry.     Coloration: Skin is not pale.     Findings: No rash.     Comments: Her color has improved  Neurological:     Mental Status: She is alert.     Coordination: Coordination normal.     Deep Tendon Reflexes: Reflexes are normal and symmetric. Reflexes normal.  Psychiatric:        Mood and Affect: Mood normal.  Assessment & Plan:   Problem List Items Addressed This Visit       Cardiovascular and Mediastinum   Essential hypertension    bp is holding fairly steady without hctz and losartan (for AKI)  bp in fair control at this time  BP Readings from Last 1 Encounters:  12/17/21 (!) 142/80   No changes needed Most recent labs reviewed  Disc lifstyle change with low sodium diet and exercise  Plan to continue Carvedilol 12.5 mg bid  imdur 60 mg bid Hydralazine 50 mg bid   Lab today for bmet      Relevant Orders   Basic metabolic panel   Near syncope    Resolved s/p 2 hosp for dehydration  Is improved but still weak and wobbly  Strongly enc to work on fluid intake (she is hesitant) Plans to start home PT soon      Nonischemic cardiomyopathy ([place])    For f/u with cardiology soon  Baseline bp is up holding hctz and losartan No severe swelling and no sob         Endocrine   Type II diabetes mellitus with renal manifestations ([place])    Currently holding metformin due to AKI /renal  insuff Glucose is running 200-300  Appetite fair /would rather have protein shake than food   inst pt to call her endocrinologist for more assistance  Continues glimeperide and generic tradjenta  May need insulin in the future         Genitourinary   AKI (acute kidney injury) ([place]) - Primary    S/p 2 hosp for dehydration and presyncope Reviewed hospital records, lab results and studies in detail    bmet today  Has had 2 courses of lokelma for elevated K  Feels better  Holding metformin and losartan and hCTz-inst to continue holding Will need f/u with her nephrologist       Relevant Orders   Basic metabolic panel   Renal insufficiency    With recent AKI from dehydration  Long discussion re: hydration goals  She is hesitant to drink because it makes her urinate  Has a nephrologist        Relevant Orders   Basic metabolic panel     Other   Hyperkalemia    She has had 2 courses of lokelma  bmet today  Suspect due to AKI No symptoms  Last K was 5.4      Nausea    Much improved on pepcid 20 mg bid   Appetite is still not 100% but can take in food and fluids

## 2021-12-18 DIAGNOSIS — Z95810 Presence of automatic (implantable) cardiac defibrillator: Secondary | ICD-10-CM | POA: Diagnosis not present

## 2021-12-18 DIAGNOSIS — N184 Chronic kidney disease, stage 4 (severe): Secondary | ICD-10-CM | POA: Diagnosis not present

## 2021-12-18 DIAGNOSIS — M8580 Other specified disorders of bone density and structure, unspecified site: Secondary | ICD-10-CM | POA: Diagnosis not present

## 2021-12-18 DIAGNOSIS — M48061 Spinal stenosis, lumbar region without neurogenic claudication: Secondary | ICD-10-CM | POA: Diagnosis not present

## 2021-12-18 DIAGNOSIS — N3281 Overactive bladder: Secondary | ICD-10-CM | POA: Diagnosis not present

## 2021-12-18 DIAGNOSIS — M6281 Muscle weakness (generalized): Secondary | ICD-10-CM | POA: Diagnosis not present

## 2021-12-18 DIAGNOSIS — I429 Cardiomyopathy, unspecified: Secondary | ICD-10-CM | POA: Diagnosis not present

## 2021-12-18 DIAGNOSIS — G8929 Other chronic pain: Secondary | ICD-10-CM | POA: Diagnosis not present

## 2021-12-18 DIAGNOSIS — I5022 Chronic systolic (congestive) heart failure: Secondary | ICD-10-CM | POA: Diagnosis not present

## 2021-12-18 DIAGNOSIS — E785 Hyperlipidemia, unspecified: Secondary | ICD-10-CM | POA: Diagnosis not present

## 2021-12-18 DIAGNOSIS — I083 Combined rheumatic disorders of mitral, aortic and tricuspid valves: Secondary | ICD-10-CM | POA: Diagnosis not present

## 2021-12-18 DIAGNOSIS — E1142 Type 2 diabetes mellitus with diabetic polyneuropathy: Secondary | ICD-10-CM | POA: Diagnosis not present

## 2021-12-18 DIAGNOSIS — Z7984 Long term (current) use of oral hypoglycemic drugs: Secondary | ICD-10-CM | POA: Diagnosis not present

## 2021-12-18 DIAGNOSIS — E875 Hyperkalemia: Secondary | ICD-10-CM | POA: Diagnosis not present

## 2021-12-18 DIAGNOSIS — M549 Dorsalgia, unspecified: Secondary | ICD-10-CM | POA: Diagnosis not present

## 2021-12-18 DIAGNOSIS — E1122 Type 2 diabetes mellitus with diabetic chronic kidney disease: Secondary | ICD-10-CM | POA: Diagnosis not present

## 2021-12-18 DIAGNOSIS — E1169 Type 2 diabetes mellitus with other specified complication: Secondary | ICD-10-CM | POA: Diagnosis not present

## 2021-12-18 DIAGNOSIS — I959 Hypotension, unspecified: Secondary | ICD-10-CM | POA: Diagnosis not present

## 2021-12-18 DIAGNOSIS — D631 Anemia in chronic kidney disease: Secondary | ICD-10-CM | POA: Diagnosis not present

## 2021-12-18 DIAGNOSIS — N3946 Mixed incontinence: Secondary | ICD-10-CM | POA: Diagnosis not present

## 2021-12-18 DIAGNOSIS — N318 Other neuromuscular dysfunction of bladder: Secondary | ICD-10-CM | POA: Diagnosis not present

## 2021-12-18 DIAGNOSIS — Z9181 History of falling: Secondary | ICD-10-CM | POA: Diagnosis not present

## 2021-12-18 DIAGNOSIS — I110 Hypertensive heart disease with heart failure: Secondary | ICD-10-CM | POA: Diagnosis not present

## 2021-12-21 DIAGNOSIS — E785 Hyperlipidemia, unspecified: Secondary | ICD-10-CM | POA: Diagnosis not present

## 2021-12-21 DIAGNOSIS — Z95810 Presence of automatic (implantable) cardiac defibrillator: Secondary | ICD-10-CM | POA: Diagnosis not present

## 2021-12-21 DIAGNOSIS — Z7984 Long term (current) use of oral hypoglycemic drugs: Secondary | ICD-10-CM | POA: Diagnosis not present

## 2021-12-21 DIAGNOSIS — I959 Hypotension, unspecified: Secondary | ICD-10-CM | POA: Diagnosis not present

## 2021-12-21 DIAGNOSIS — E1122 Type 2 diabetes mellitus with diabetic chronic kidney disease: Secondary | ICD-10-CM | POA: Diagnosis not present

## 2021-12-21 DIAGNOSIS — N3946 Mixed incontinence: Secondary | ICD-10-CM | POA: Diagnosis not present

## 2021-12-21 DIAGNOSIS — M48061 Spinal stenosis, lumbar region without neurogenic claudication: Secondary | ICD-10-CM | POA: Diagnosis not present

## 2021-12-21 DIAGNOSIS — D631 Anemia in chronic kidney disease: Secondary | ICD-10-CM | POA: Diagnosis not present

## 2021-12-21 DIAGNOSIS — G8929 Other chronic pain: Secondary | ICD-10-CM | POA: Diagnosis not present

## 2021-12-21 DIAGNOSIS — I5022 Chronic systolic (congestive) heart failure: Secondary | ICD-10-CM | POA: Diagnosis not present

## 2021-12-21 DIAGNOSIS — M8580 Other specified disorders of bone density and structure, unspecified site: Secondary | ICD-10-CM | POA: Diagnosis not present

## 2021-12-21 DIAGNOSIS — Z9181 History of falling: Secondary | ICD-10-CM | POA: Diagnosis not present

## 2021-12-21 DIAGNOSIS — N184 Chronic kidney disease, stage 4 (severe): Secondary | ICD-10-CM | POA: Diagnosis not present

## 2021-12-21 DIAGNOSIS — M6281 Muscle weakness (generalized): Secondary | ICD-10-CM | POA: Diagnosis not present

## 2021-12-21 DIAGNOSIS — E1142 Type 2 diabetes mellitus with diabetic polyneuropathy: Secondary | ICD-10-CM | POA: Diagnosis not present

## 2021-12-21 DIAGNOSIS — I429 Cardiomyopathy, unspecified: Secondary | ICD-10-CM | POA: Diagnosis not present

## 2021-12-21 DIAGNOSIS — N3281 Overactive bladder: Secondary | ICD-10-CM | POA: Diagnosis not present

## 2021-12-21 DIAGNOSIS — E1169 Type 2 diabetes mellitus with other specified complication: Secondary | ICD-10-CM | POA: Diagnosis not present

## 2021-12-21 DIAGNOSIS — I110 Hypertensive heart disease with heart failure: Secondary | ICD-10-CM | POA: Diagnosis not present

## 2021-12-21 DIAGNOSIS — N318 Other neuromuscular dysfunction of bladder: Secondary | ICD-10-CM | POA: Diagnosis not present

## 2021-12-21 DIAGNOSIS — I083 Combined rheumatic disorders of mitral, aortic and tricuspid valves: Secondary | ICD-10-CM | POA: Diagnosis not present

## 2021-12-21 DIAGNOSIS — E875 Hyperkalemia: Secondary | ICD-10-CM | POA: Diagnosis not present

## 2021-12-21 DIAGNOSIS — M549 Dorsalgia, unspecified: Secondary | ICD-10-CM | POA: Diagnosis not present

## 2021-12-24 NOTE — Progress Notes (Unsigned)
Cardiology Office Note  Date:  12/25/2021   ID:  Morgan [facility], Morgan [facility] 02/28/1935, MRN [id number]  PCP:  Abner Greenspan, MD   Chief Complaint  Patient presents with   6 month follow up     Patient c/o elevated blood pressure. Medications reviewed by the patient verbally.     HPI:  Morgan [facility] is a very pleasant 87 year old woman with past medical history of  nonischemic cardiomyopathy,   Biventricular ICD placed May 2016  Cath 2012 with no CAD ejection fraction of 30% in 2009  left bundle branch block,  TIA/stroke August 2015.  had difficulty speaking,  Walking nephrectomy/donated kidney in 1989,  possible angioedema on lisinopril,  back surgery x3,  minimal carotid b/l in 2015, carotid bruit on the right poorly controlled diabetes   Ejection fraction 45 to 50% in June 2023 up from 40 to 45% in January 2020 She presents for routine followup  Of her cardiomyopathy  Last seen in clinic by myself October 2022 Followed by EP at outside facility  Several recent trips to the emergency room and hospitalizations, for acute on chronic renal failure and for poorly controlled hypertension Admitted to the hospital at [place] shortness of breath, chest pain, hypertension Poorly controlled hypertension, systolic pressures greater than 200 Discharged on Imdur 60 hydralazine 50 twice daily losartan HCT 100/12.5 daily Coreg 12.5 twice daily, Lasix 10 daily On second trip to the hospital losartan HCTZ was held secondary to renal dysfunction  Admission to [facility]: Metformin on hold, off ARB  Sleeps late in the am, reports that she wakes up at 11 and typically goes to bed between 11 PM and 1 AM  Currently taking carvedilol 12.5 twice daily, hydralazine 50 twice daily, Imdur 60 twice daily  Sedentary, no regular exercise , chronic pain, leg weakness  Ejection fraction of 15-20% in 2015,  Up to 35% in August 2016,  Up to 40% in 2017 Up to 55% in 06/2017 40 to 45% in 06/2018 45 to 50% in  June 2023  On aspirin with Plavix for strokes, TIAs  EKG personally reviewed by myself on todays visit Shows paced rhythm rate 63 bpm  MRI results reviewed and discussed in detail MRI brain/MRA head w/o contrast 03/08/19: 1. No acute intracranial process. Left frontal lobe encephalomalacia, likely due to prior ischemia.  2. Unchanged high grade narrowing of the left MCA distal M1 segment.  Lab Results  Component Value Date   CHOL 99 06/12/2020   HDL 33.20 (L) 06/12/2020   LDLCALC 46 06/10/2019   TRIG 209.0 (H) 06/12/2020     Other past medical history reviewed TIA/stroke August 2015.  had difficulty speaking,  Walking  Initially placed on aspirin, Plavix, been changed by neurology to aspirin 325 mg daily , Plavix held  She continues to follow-up at [facility]   with cardiology and EP  Biventricular ICD placed May 2016 for shortness of breath symptoms placed by  Dr. Beather Arbour     History ofsevere back disease she has significant neuropathy in her legs.    Echocardiogram was done for her fatigue and shortness of breath.  ejection fraction of 25-30% which was significant drop from prior ejection fraction February 2013 (45%) done at [facility].  normal RVSP.   Prior cardiac catheterization 08/30/2010 in [place]  showed no significant coronary artery disease  PMH:   has a past medical history of Arthritis, Basal cell carcinoma (01/2014), Cardiac LV ejection fraction >40%, Chronic lower back pain,  CKD (chronic kidney disease), stage III ([place]), Colon polyps, Expressive aphasia, GERD (gastroesophageal reflux disease), Goiter (past remote), Hyperlipidemia, Hyperpotassemia, Hypertension, Hypertonicity of bladder, Inflammatory and toxic neuropathy, unspecified, LBBB (left bundle branch block), Migraine, Mini stroke (01/2014), Other primary cardiomyopathies, Overactive bladder, Presence of combination internal cardiac defibrillator (ICD) and pacemaker, Reflex sympathetic dystrophy, unspecified,  Shingles, Shortness of breath, Stroke ([place]), Thyroid disease, Type II diabetes mellitus ([place]), Ulcer, Urine incontinence, and Vertigo.  PSH:    Past Surgical History:  Procedure Laterality Date   BACK SURGERY     BI-VENTRICULAR PACEMAKER INSERTION (CRT-P)  10/2014   [facility]   BREAST CYST EXCISION Left 1959   CARDIAC CATHETERIZATION  "several"   [place]   CATARACT EXTRACTION W/ INTRAOCULAR LENS IMPLANT Left ~ 2005   several eye injections   CATARACT EXTRACTION W/PHACO Right 05/04/2020   Procedure: CATARACT EXTRACTION PHACO AND INTRAOCULAR LENS PLACEMENT ([place]) RIGHT DIABETIC;  Surgeon: Birder Robson, MD;  Location: [facility];  Service: Ophthalmology;  Laterality: Right;  [place] 00:58.5 CDE 6.70 Fluid Pack [place] # [id number]   CHOLECYSTECTOMY N/A 06/13/2018   Procedure: LAPAROSCOPIC CHOLECYSTECTOMY;  Surgeon: Donnie Mesa, MD;  Location: [place];  Service: General;  Laterality: N/A;   COLONOSCOPY  7/12   normal (hx of polyps in past)    CT SCAN  3/12   outside hosp- lung nodule and gallstones   ERCP N/A 06/12/2018   Procedure: ENDOSCOPIC RETROGRADE CHOLANGIOPANCREATOGRAPHY (ERCP);  Surgeon: Carol Ada, MD;  Location: [place];  Service: Endoscopy;  Laterality: N/A;   [place]   "knot on index"   KIDNEY DONATION Left 1989   LUMBAR LAMINECTOMY/DECOMPRESSION MICRODISCECTOMY  1999   LUMBAR LAMINECTOMY/DECOMPRESSION MICRODISCECTOMY  01/31/2012   Procedure: LUMBAR LAMINECTOMY/DECOMPRESSION MICRODISCECTOMY 2 LEVELS;  Surgeon: Eustace Moore, MD;  Location: [place] [facility]UR[facility]S;  Service: Neurosurgery;  Laterality: N/A;  Thoracic twelve-lumbar one, lumbar one-two laminectomy    POSTERIOR LAMINECTOMY / [place]   REMOVAL OF STONES  06/12/2018   Procedure: REMOVAL OF STONES;  Surgeon: Carol Ada, MD;  Location: [place];  Service: Endoscopy;;   SPHINCTEROTOMY  06/12/2018   Procedure: Joan Mayans;  Surgeon: Carol Ada, MD;  Location: [place];   Service: Endoscopy;;   TUBAL LIGATION  1976   UPPER GASTROINTESTINAL ENDOSCOPY      Current Outpatient Medications  Medication Sig Dispense Refill   aspirin EC 81 MG tablet Take 1 tablet (81 mg total) by mouth daily. Swallow whole. 90 tablet 3   cetirizine (ZYRTEC) 10 MG tablet Take 10 mg by mouth daily.     clotrimazole-betamethasone (LOTRISONE) cream Apply 1 application topically as needed (groin).      famotidine (PEPCID) 20 MG tablet Take 1 tablet (20 mg total) by mouth 2 (two) times daily. 60 tablet 1   gabapentin (NEURONTIN) 300 MG capsule Take 1 capsule (300 mg total) by mouth 3 (three) times daily. 270 capsule 3   glimepiride (AMARYL) 1 MG tablet Take 1-2 tablets (1-2 mg total) by mouth daily with breakfast. 60 tablet 1   insulin degludec (TRESIBA) 100 UNIT/ML FlexTouch Pen Inject into the skin.     letrozole (FEMARA) 2.5 MG tablet Take 2.5 mg by mouth daily.     linagliptin (TRADJENTA) 5 MG TABS tablet Take 5 mg by mouth daily.     mirabegron ER ([name]) 50 MG TB24 tablet Take 1 tablet (50 mg total) by mouth at bedtime. 90 tablet 3   nystatin cream (MYCOSTATIN) Apply 1 application  topically 2 (two) times daily as needed (irritation.).      pantoprazole (PROTONIX) 40 MG tablet Take 1 tablet by mouth once daily 90 tablet 1   traMADol (ULTRAM) 50 MG tablet Take 1 tablet (50 mg total) by mouth every 12 (twelve) hours as needed.     vitamin B-12 (CYANOCOBALAMIN) 1000 MCG tablet Take 1,000 mcg by mouth daily.      Vitamin D, Ergocalciferol, (DRISDOL) 1.25 MG (50000 UT) CAPS capsule Take 50,000 Units by mouth every 7 (seven) days.     atorvastatin (LIPITOR) 80 MG tablet Take 1 tablet (80 mg total) by mouth daily. 90 tablet 3   carvedilol (COREG) 12.5 MG tablet Take 1 tablet (12.5 mg total) by mouth in the morning, at noon, and at bedtime. 270 tablet 3   hydrALAZINE (APRESOLINE) 50 MG tablet Take 1 tablet (50 mg total) by mouth 3 (three) times daily. 270 tablet 3   isosorbide  mononitrate (IMDUR) 60 MG 24 hr tablet Take 1 tablet (60 mg total) by mouth in the morning and at bedtime. 180 tablet 3   metFORMIN (GLUCOPHAGE) 1000 MG tablet Held as of 12/05/21 (Patient not taking: Reported on 12/25/2021)     No current facility-administered medications for this visit.     Allergies:   Ace inhibitors, Codeine, Hydrochlorothiazide, Insulin detemir, Lisinopril, Nsaids, and Tylenol [acetaminophen]   Social History:  The patient  reports that she has never smoked. She has never used smokeless tobacco. She reports that she does not drink alcohol and does not use drugs.   Family History:   family history includes Addison's disease in an other family member; Alcohol abuse in her father; Arthritis in her mother; Cancer in her brother, mother, and sister; Diabetes in her brother, daughter, father, sister, and sister; Heart disease in her sister; Hyperlipidemia in her brother, mother, and sister; Hypertension in her mother and sister; Kidney disease in her brother; Kidney failure in her brother; Stroke in her mother.    Review of Systems: Review of Systems  HENT: Negative.    Respiratory: Negative.    Cardiovascular: Negative.   Gastrointestinal: Negative.   Musculoskeletal: Negative.        Gait instability  Neurological:  Positive for weakness.  Psychiatric/Behavioral: Negative.    All other systems reviewed and are negative.   PHYSICAL EXAM: VS:  BP (!) 166/70 (BP Location: Left Arm, Patient Position: Sitting, Cuff Size: Normal)   Pulse 63   Ht '5\' 1"'$  (1.549 m)   Wt 142 lb 6 oz (64.6 kg)   LMP 06/03/1992   SpO2 96%   BMI 26.90 kg/m  , BMI Body mass index is 26.9 kg/m. Constitutional:  oriented to person, place, and time. No distress.  HENT:  Head: Grossly normal Eyes:  no discharge. No scleral icterus.  Neck: No JVD, no carotid bruits  Cardiovascular: Regular rate and rhythm, no murmurs appreciated Pulmonary/Chest: Clear to auscultation bilaterally, no wheezes or  rails Abdominal: Soft.  no distension.  no tenderness.  Musculoskeletal: Normal range of motion Neurological:  normal muscle tone. Coordination normal. No atrophy Skin: Skin warm and dry Psychiatric: normal affect, pleasant  Recent Labs: 11/28/2021: TSH 2.34 11/29/2021: ALT 16 11/30/2021: Magnesium 1.7 12/07/2021: Hemoglobin 11.0; Platelets 179 12/17/2021: BUN 27; Creatinine, Ser 1.39; Potassium 4.9; Sodium 139    Lipid Panel Lab Results  Component Value Date   CHOL 99 06/12/2020   HDL 33.20 (L) 06/12/2020   LDLCALC 46 06/10/2019   TRIG 209.0 (H) 06/12/2020  Wt Readings from Last 3 Encounters:  12/25/21 142 lb 6 oz (64.6 kg)  12/17/21 140 lb 12.8 oz (63.9 kg)  12/07/21 137 lb (62.1 kg)     ASSESSMENT AND PLAN:  HYPERCHOLESTEROLEMIA Cholesterol is at goal on the current lipid regimen. No changes to the medications were made.  Essential hypertension Blood pressure continues to run high in the office and at home Imdur 60 twice daily Recommend she increase Hydrlazine 50 TID Recommend she increase Coreg 12.5 TID, she feels the pill is too small to cut and take twice daily Call us with blood pressure measurements Additional options include higher imdur, cardura/clonidine  Nonischemic cardiomyopathy ([place]) Prior catheterization no significant coronary disease echocardiogram from 2019  normal ejection fraction Ejection fraction 40 to 45% January 2020 Most recent ejection fraction 45 to 50% Likely component of hypertensive heart disease Aggressive blood pressure management as above  Cerebral thrombosis with cerebral infarction (HCC) Continue aspirin Plavix Cholesterol at goal  Acute on chronic renal failure In the setting of poorly controlled hypertension, poorly controlled diabetes  Type 2 diabetes mellitus without complication, unspecified long term insulin use status (HCC) A1c elevated, 8.0 Follow-up with endocrine, diet restriction recommended  Falls   deconditioning, unable to exercise Working with PT currently  Detailed review of multiple hospitalizations at [place], [place]  Total encounter time more than 40 minutes  Greater than 50% was spent in counseling and coordination of care with the patient    Orders Placed This Encounter  Procedures   EKG 12-Lead     Signed, Esmond Plants, M.D., Ph.D. 12/25/2021  [place], [place]

## 2021-12-25 ENCOUNTER — Ambulatory Visit: Payer: Medicare HMO | Admitting: Cardiovascular Disease

## 2021-12-25 ENCOUNTER — Encounter: Payer: Self-pay | Admitting: Cardiovascular Disease

## 2021-12-25 VITALS — BP 166/70 | HR 63 | Ht 61.0 in | Wt 142.4 lb

## 2021-12-25 DIAGNOSIS — I110 Hypertensive heart disease with heart failure: Secondary | ICD-10-CM | POA: Diagnosis not present

## 2021-12-25 DIAGNOSIS — N184 Chronic kidney disease, stage 4 (severe): Secondary | ICD-10-CM | POA: Diagnosis not present

## 2021-12-25 DIAGNOSIS — Z9181 History of falling: Secondary | ICD-10-CM | POA: Diagnosis not present

## 2021-12-25 DIAGNOSIS — I083 Combined rheumatic disorders of mitral, aortic and tricuspid valves: Secondary | ICD-10-CM | POA: Diagnosis not present

## 2021-12-25 DIAGNOSIS — N318 Other neuromuscular dysfunction of bladder: Secondary | ICD-10-CM | POA: Diagnosis not present

## 2021-12-25 DIAGNOSIS — E7800 Pure hypercholesterolemia, unspecified: Secondary | ICD-10-CM | POA: Diagnosis not present

## 2021-12-25 DIAGNOSIS — E785 Hyperlipidemia, unspecified: Secondary | ICD-10-CM | POA: Diagnosis not present

## 2021-12-25 DIAGNOSIS — I10 Essential (primary) hypertension: Secondary | ICD-10-CM | POA: Diagnosis not present

## 2021-12-25 DIAGNOSIS — I959 Hypotension, unspecified: Secondary | ICD-10-CM | POA: Diagnosis not present

## 2021-12-25 DIAGNOSIS — M8580 Other specified disorders of bone density and structure, unspecified site: Secondary | ICD-10-CM | POA: Diagnosis not present

## 2021-12-25 DIAGNOSIS — G8929 Other chronic pain: Secondary | ICD-10-CM | POA: Diagnosis not present

## 2021-12-25 DIAGNOSIS — M6281 Muscle weakness (generalized): Secondary | ICD-10-CM | POA: Diagnosis not present

## 2021-12-25 DIAGNOSIS — R0602 Shortness of breath: Secondary | ICD-10-CM

## 2021-12-25 DIAGNOSIS — I429 Cardiomyopathy, unspecified: Secondary | ICD-10-CM | POA: Diagnosis not present

## 2021-12-25 DIAGNOSIS — I5022 Chronic systolic (congestive) heart failure: Secondary | ICD-10-CM | POA: Diagnosis not present

## 2021-12-25 DIAGNOSIS — I428 Other cardiomyopathies: Secondary | ICD-10-CM

## 2021-12-25 DIAGNOSIS — E1122 Type 2 diabetes mellitus with diabetic chronic kidney disease: Secondary | ICD-10-CM | POA: Diagnosis not present

## 2021-12-25 DIAGNOSIS — N3946 Mixed incontinence: Secondary | ICD-10-CM | POA: Diagnosis not present

## 2021-12-25 DIAGNOSIS — E875 Hyperkalemia: Secondary | ICD-10-CM | POA: Diagnosis not present

## 2021-12-25 DIAGNOSIS — N3281 Overactive bladder: Secondary | ICD-10-CM | POA: Diagnosis not present

## 2021-12-25 DIAGNOSIS — E1169 Type 2 diabetes mellitus with other specified complication: Secondary | ICD-10-CM | POA: Diagnosis not present

## 2021-12-25 DIAGNOSIS — M549 Dorsalgia, unspecified: Secondary | ICD-10-CM | POA: Diagnosis not present

## 2021-12-25 DIAGNOSIS — E1142 Type 2 diabetes mellitus with diabetic polyneuropathy: Secondary | ICD-10-CM | POA: Diagnosis not present

## 2021-12-25 DIAGNOSIS — Z95810 Presence of automatic (implantable) cardiac defibrillator: Secondary | ICD-10-CM | POA: Diagnosis not present

## 2021-12-25 DIAGNOSIS — D631 Anemia in chronic kidney disease: Secondary | ICD-10-CM | POA: Diagnosis not present

## 2021-12-25 DIAGNOSIS — E119 Type 2 diabetes mellitus without complications: Secondary | ICD-10-CM | POA: Diagnosis not present

## 2021-12-25 DIAGNOSIS — M48061 Spinal stenosis, lumbar region without neurogenic claudication: Secondary | ICD-10-CM | POA: Diagnosis not present

## 2021-12-25 DIAGNOSIS — Z7984 Long term (current) use of oral hypoglycemic drugs: Secondary | ICD-10-CM | POA: Diagnosis not present

## 2021-12-25 MED ORDER — CARVEDILOL 12.5 MG PO TABS: 12.5000 mg | ORAL_TABLET | Freq: Three times a day (TID) | ORAL | 3 refills | Status: DC

## 2021-12-25 MED ORDER — HYDRALAZINE HCL 50 MG PO TABS: 50.0000 mg | ORAL_TABLET | Freq: Three times a day (TID) | ORAL | 3 refills | Status: DC

## 2021-12-25 MED ORDER — ATORVASTATIN CALCIUM 80 MG PO TABS: 80.0000 mg | ORAL_TABLET | Freq: Every day | ORAL | 3 refills | Status: DC

## 2021-12-25 MED ORDER — ISOSORBIDE MONONITRATE ER 60 MG PO TB24: 60.0000 mg | ORAL_TABLET | Freq: Two times a day (BID) | ORAL | 3 refills | Status: DC

## 2021-12-25 MED ORDER — CARVEDILOL 12.5 MG PO TABS: 12.5000 mg | ORAL_TABLET | Freq: Two times a day (BID) | ORAL | 3 refills | Status: DC

## 2021-12-25 NOTE — Patient Instructions (Addendum)
Medication Instructions:  Please increase the hydralazine 50 mg three times a day 9 Am, 3pm, 11 pm Monitor blood pressure daily  Coreg/carvedilol 12.5 mg three times a day 9 Am, 3pm, 11 pm  If you need a refill on your cardiac medications before your next appointment, please call your pharmacy.   Lab work: No new labs needed  Testing/Procedures: No new testing needed  Follow-Up: At [facility], you and your health needs are our priority.  As part of our continuing mission to provide you with exceptional heart care, we have created designated Provider Care Teams.  These Care Teams include your primary Cardiologist (physician) and Advanced Practice Providers (APPs -  Physician Assistants and Nurse Practitioners) who all work together to provide you with the care you need, when you need it.  You will need a follow up appointment in 6 months  Providers on your designated Care Team:   Murray Hodgkins, NP Christell Faith, PA-C Cadence Kathlen Mody, [place]  COVID-19 Vaccine Information can be found at: [email or web address].uk For questions related to vaccine distribution or appointments, please email [email or web address] or call [phone].

## 2021-12-26 DIAGNOSIS — I10 Essential (primary) hypertension: Secondary | ICD-10-CM | POA: Diagnosis not present

## 2021-12-26 DIAGNOSIS — W19XXXD Unspecified fall, subsequent encounter: Secondary | ICD-10-CM | POA: Diagnosis not present

## 2021-12-26 DIAGNOSIS — R5383 Other fatigue: Secondary | ICD-10-CM | POA: Diagnosis not present

## 2021-12-26 DIAGNOSIS — I428 Other cardiomyopathies: Secondary | ICD-10-CM | POA: Diagnosis not present

## 2021-12-26 DIAGNOSIS — Z95810 Presence of automatic (implantable) cardiac defibrillator: Secondary | ICD-10-CM | POA: Diagnosis not present

## 2021-12-31 ENCOUNTER — Telehealth: Payer: Self-pay | Admitting: Family Medicine

## 2021-12-31 DIAGNOSIS — I10 Essential (primary) hypertension: Secondary | ICD-10-CM

## 2021-12-31 DIAGNOSIS — N289 Disorder of kidney and ureter, unspecified: Secondary | ICD-10-CM

## 2021-12-31 DIAGNOSIS — E875 Hyperkalemia: Secondary | ICD-10-CM

## 2021-12-31 NOTE — Telephone Encounter (Signed)
-----   Message from Ellamae Sia sent at 12/18/2021  4:27 PM EDT ----- Regarding: Lab orders for Tuesday, 8.1.23 Lab orders, thanks

## 2022-01-01 ENCOUNTER — Other Ambulatory Visit (INDEPENDENT_AMBULATORY_CARE_PROVIDER_SITE_OTHER): Payer: Medicare HMO

## 2022-01-01 ENCOUNTER — Telehealth: Payer: Self-pay

## 2022-01-01 DIAGNOSIS — N289 Disorder of kidney and ureter, unspecified: Secondary | ICD-10-CM | POA: Diagnosis not present

## 2022-01-01 DIAGNOSIS — E875 Hyperkalemia: Secondary | ICD-10-CM | POA: Diagnosis not present

## 2022-01-01 DIAGNOSIS — I10 Essential (primary) hypertension: Secondary | ICD-10-CM

## 2022-01-01 LAB — BASIC METABOLIC PANEL
BUN: 30 mg/dL — ABNORMAL HIGH (ref 6–23)
CO2: 31 mEq/L (ref 19–32)
Calcium: 8.8 mg/dL (ref 8.4–10.5)
Chloride: 104 mEq/L (ref 96–112)
Creatinine, Ser: 1.67 mg/dL — ABNORMAL HIGH (ref 0.40–1.20)
GFR: 27.85 mL/min — ABNORMAL LOW (ref >60.00)
Glucose, Bld: 170 mg/dL — ABNORMAL HIGH (ref 70–99)
Potassium: 5.1 mEq/L (ref 3.5–5.1)
Sodium: 140 mEq/L (ref 135–145)

## 2022-01-01 NOTE — Chronic Care Management (AMB) (Signed)
Chronic Care Management Pharmacy Assistant   Name: Morgan [facility]  MRN: [id number] DOB: December 30, 1934   Reason for Encounter: Reminder Call   Conditions to be addressed/monitored: HTN, HLD, and DMII   Recent office visits:  12/17/21-Morgan Tower,MD(PCP)-f/u AKI,Labs,May need insulin in the future ,start PT,increase fluid intake.(Potassium is back into the normal range,Kidney numbers are stable to slightly improved ) 12/07/21-Morgan Duncan,MD(fam med)-hospital f/u Hold metformin for now.  Continue linagliptin.  I would try restarting glimepiride '1mg'$  with breakfast.  If sugar is still above 200 after 1 week, then increase to 2 mg with breakfast. Would stay off losartan and hydrochlorothiazide for now.Labs ordered  11/28/21-Morgan Tower,MD(PCP)-f/u hospital visit,Renal labs today (Worse kidney function - was sent to ER today with malaise and bp issues and dehydration ) If improved will start back losartan hctz - referral for [facility]. 11/05/21-Morgan Tower,MD(PCP)-GI upset,labs(Potassium is mildly high,stable kidney numbers ,Liver tests are stable but protein levels are low Other labs are ok -Adding pepcid 20 mg twice daily  09/19/21-Morgan Copland,MD(fam med)- acute left shoulder pain,rest 2 weeks, no medication changes 07/16/21-Morgan Copland,MD(fam med)-right shoulder pain,xrays,injection with kenalog and lidocaine.  Recent consult visits:  12/25/21-Morgan Gollan,MD(cardio)-f/u heart disease,EKG, imaging reviewed,Recommend she increase Hydrlazine 50 TID,Recommend she increase Coreg 12.5 TID,monitor BP daily,f/u 6 months 12/17/21-Morgan Solum,MD(endo)-Telephone call to report BG's running high 11/16/21-Morgan Hubbard-elevated BP,discontinue tylenol and use asa for HA.No admission 11/06/21-Morgan Solum,MD-(endo)-f/u DM,Stop glimepiride,Will replace with Tradjenta 5 mg daliy. F/u 3 months w labs 08/01/21-Morgan Ward,MD(cardio)-f/u, start some PT,ride stationary bike,no medication changes, f/u 6 months 07/27/21-Morgan  Solum,MD(endo)-f/u DM,Add Trulicity 1.94 mg weekly.f/u 3 months    Hospital visits:  Medication Reconciliation was completed by comparing discharge summary, patient's EMR and Pharmacy list, and upon discussion with patient.  [facility] to [facility] due to Hypotension. Discharge date was 11/30/21. Discharged from [facility].    Medications Discontinued at Hospital Discharge: -Stopped glimepiride  Losartan/HCTZ  Trulicity  Medications that remain the same after Hospital Discharge:??  -All other medications will remain the same.    Admitted to the hospital on 11/19/21 due to SOB. Discharge date was 11/24/21. [facility] Start[facility] at [facility]:?? -started coreg new dose '25mg'$  BID   Medication Changes at Hospital Discharge: -Changed Metformin ( on hold)  Medications Discontinued at Hospital Discharge: -Stopped Losartan/Hctz  Plavix  Tramadol  Furosemide  Medications that remain the same after Hospital Discharge:??  -All other medications will remain the same.     Medications: Outpatient Encounter Medications as of 01/01/2022  Medication Sig   aspirin EC 81 MG tablet Take 1 tablet (81 mg total) by mouth daily. Swallow whole.   atorvastatin (LIPITOR) 80 MG tablet Take 1 tablet (80 mg total) by mouth daily.   carvedilol (COREG) 12.5 MG tablet Take 1 tablet (12.5 mg total) by mouth in the morning, at noon, and at bedtime.   cetirizine (ZYRTEC) 10 MG tablet Take 10 mg by mouth daily.   clotrimazole-betamethasone (LOTRISONE) cream Apply 1 application topically as needed (groin).    famotidine (PEPCID) 20 MG tablet Take 1 tablet (20 mg total) by mouth 2 (two) times daily.   gabapentin (NEURONTIN) 300 MG capsule Take 1 capsule (300 mg total) by mouth 3 (three) times daily.   glimepiride (AMARYL) 1 MG tablet Take 1-2 tablets (1-2 mg total) by mouth daily with breakfast.   hydrALAZINE (APRESOLINE) 50 MG tablet  Take 1 tablet (50 mg total) by mouth  3 (three) times daily.   insulin degludec (TRESIBA) 100 UNIT/ML FlexTouch Pen Inject into the skin.   isosorbide mononitrate (IMDUR) 60 MG 24 hr tablet Take 1 tablet (60 mg total) by mouth in the morning and at bedtime.   letrozole (FEMARA) 2.5 MG tablet Take 2.5 mg by mouth daily.   linagliptin (TRADJENTA) 5 MG TABS tablet Take 5 mg by mouth daily.   metFORMIN (GLUCOPHAGE) 1000 MG tablet Held as of 12/05/21 (Patient not taking: Reported on 12/25/2021)   mirabegron ER ([name]) 50 MG TB24 tablet Take 1 tablet (50 mg total) by mouth at bedtime.   nystatin cream (MYCOSTATIN) Apply 1 application topically 2 (two) times daily as needed (irritation.).    pantoprazole (PROTONIX) 40 MG tablet Take 1 tablet by mouth once daily   traMADol (ULTRAM) 50 MG tablet Take 1 tablet (50 mg total) by mouth every 12 (twelve) hours as needed.   vitamin B-12 (CYANOCOBALAMIN) 1000 MCG tablet Take 1,000 mcg by mouth daily.    Vitamin D, Ergocalciferol, (DRISDOL) 1.25 MG (50000 UT) CAPS capsule Take 50,000 Units by mouth every 7 (seven) days.   No facility-administered encounter medications on file as of 01/01/2022.   Morgan [facility] was contacted to remind of upcoming telephone visit with Morgan [facility]  on 01/04/22 at 1:45pm. Patient was reminded to have any blood glucose and blood pressure readings available for review at appointment.   Patient confirmed appointment.   Are you having any problems with your medications? No   Do you have any concerns you like to discuss with the pharmacist? No  patient was supposed to have a pet scan/labs/MD appt at [facility] yesterday and her BG was 70. They resched the appointment    CCM referral has been placed prior to visit?  Yes    Star Rating Drugs: Medication:  Last Fill: Day Supply Atorvastatin '80mg'$  12/25/21 90 Glimepiride '1mg'$  12/07/21  30 Tradjenta '5mg'$   11/06/21  90 Metformin '1000mg'$  12/23/21 90 Tresiba  12/17/21 108 Losartan  pot/hctz 12/23/21 Morgan [facility], CPP notified  Morgan [facility], Morgan [facility]  [phone]

## 2022-01-02 DIAGNOSIS — M549 Dorsalgia, unspecified: Secondary | ICD-10-CM | POA: Diagnosis not present

## 2022-01-02 DIAGNOSIS — N3281 Overactive bladder: Secondary | ICD-10-CM | POA: Diagnosis not present

## 2022-01-02 DIAGNOSIS — N3946 Mixed incontinence: Secondary | ICD-10-CM | POA: Diagnosis not present

## 2022-01-02 DIAGNOSIS — E1169 Type 2 diabetes mellitus with other specified complication: Secondary | ICD-10-CM | POA: Diagnosis not present

## 2022-01-02 DIAGNOSIS — E785 Hyperlipidemia, unspecified: Secondary | ICD-10-CM | POA: Diagnosis not present

## 2022-01-02 DIAGNOSIS — I959 Hypotension, unspecified: Secondary | ICD-10-CM | POA: Diagnosis not present

## 2022-01-02 DIAGNOSIS — I5022 Chronic systolic (congestive) heart failure: Secondary | ICD-10-CM | POA: Diagnosis not present

## 2022-01-02 DIAGNOSIS — N318 Other neuromuscular dysfunction of bladder: Secondary | ICD-10-CM | POA: Diagnosis not present

## 2022-01-02 DIAGNOSIS — I110 Hypertensive heart disease with heart failure: Secondary | ICD-10-CM | POA: Diagnosis not present

## 2022-01-02 DIAGNOSIS — I429 Cardiomyopathy, unspecified: Secondary | ICD-10-CM | POA: Diagnosis not present

## 2022-01-02 DIAGNOSIS — M48061 Spinal stenosis, lumbar region without neurogenic claudication: Secondary | ICD-10-CM | POA: Diagnosis not present

## 2022-01-02 DIAGNOSIS — Z7984 Long term (current) use of oral hypoglycemic drugs: Secondary | ICD-10-CM | POA: Diagnosis not present

## 2022-01-02 DIAGNOSIS — E1142 Type 2 diabetes mellitus with diabetic polyneuropathy: Secondary | ICD-10-CM | POA: Diagnosis not present

## 2022-01-02 DIAGNOSIS — E875 Hyperkalemia: Secondary | ICD-10-CM | POA: Diagnosis not present

## 2022-01-02 DIAGNOSIS — E1122 Type 2 diabetes mellitus with diabetic chronic kidney disease: Secondary | ICD-10-CM | POA: Diagnosis not present

## 2022-01-02 DIAGNOSIS — D631 Anemia in chronic kidney disease: Secondary | ICD-10-CM | POA: Diagnosis not present

## 2022-01-02 DIAGNOSIS — Z9181 History of falling: Secondary | ICD-10-CM | POA: Diagnosis not present

## 2022-01-02 DIAGNOSIS — M6281 Muscle weakness (generalized): Secondary | ICD-10-CM | POA: Diagnosis not present

## 2022-01-02 DIAGNOSIS — Z95810 Presence of automatic (implantable) cardiac defibrillator: Secondary | ICD-10-CM | POA: Diagnosis not present

## 2022-01-02 DIAGNOSIS — G8929 Other chronic pain: Secondary | ICD-10-CM | POA: Diagnosis not present

## 2022-01-02 DIAGNOSIS — I083 Combined rheumatic disorders of mitral, aortic and tricuspid valves: Secondary | ICD-10-CM | POA: Diagnosis not present

## 2022-01-02 DIAGNOSIS — M8580 Other specified disorders of bone density and structure, unspecified site: Secondary | ICD-10-CM | POA: Diagnosis not present

## 2022-01-02 DIAGNOSIS — N184 Chronic kidney disease, stage 4 (severe): Secondary | ICD-10-CM | POA: Diagnosis not present

## 2022-01-03 DIAGNOSIS — R809 Proteinuria, unspecified: Secondary | ICD-10-CM | POA: Diagnosis not present

## 2022-01-03 DIAGNOSIS — E1165 Type 2 diabetes mellitus with hyperglycemia: Secondary | ICD-10-CM | POA: Diagnosis not present

## 2022-01-03 DIAGNOSIS — I10 Essential (primary) hypertension: Secondary | ICD-10-CM | POA: Diagnosis not present

## 2022-01-03 DIAGNOSIS — N184 Chronic kidney disease, stage 4 (severe): Secondary | ICD-10-CM | POA: Diagnosis not present

## 2022-01-03 DIAGNOSIS — E1169 Type 2 diabetes mellitus with other specified complication: Secondary | ICD-10-CM | POA: Diagnosis not present

## 2022-01-03 DIAGNOSIS — Z794 Long term (current) use of insulin: Secondary | ICD-10-CM | POA: Diagnosis not present

## 2022-01-03 DIAGNOSIS — E785 Hyperlipidemia, unspecified: Secondary | ICD-10-CM | POA: Diagnosis not present

## 2022-01-03 DIAGNOSIS — E1129 Type 2 diabetes mellitus with other diabetic kidney complication: Secondary | ICD-10-CM | POA: Diagnosis not present

## 2022-01-03 DIAGNOSIS — E1122 Type 2 diabetes mellitus with diabetic chronic kidney disease: Secondary | ICD-10-CM | POA: Diagnosis not present

## 2022-01-04 ENCOUNTER — Telehealth: Payer: Self-pay

## 2022-01-04 ENCOUNTER — Ambulatory Visit (INDEPENDENT_AMBULATORY_CARE_PROVIDER_SITE_OTHER): Payer: Medicare HMO | Admitting: Family Medicine

## 2022-01-04 ENCOUNTER — Telehealth: Payer: Medicare HMO

## 2022-01-04 ENCOUNTER — Encounter: Payer: Self-pay | Admitting: Family Medicine

## 2022-01-04 VITALS — BP 144/58 | HR 69 | Temp 96.2°F | Ht 61.0 in | Wt 141.2 lb

## 2022-01-04 DIAGNOSIS — E875 Hyperkalemia: Secondary | ICD-10-CM | POA: Diagnosis not present

## 2022-01-04 DIAGNOSIS — I10 Essential (primary) hypertension: Secondary | ICD-10-CM | POA: Diagnosis not present

## 2022-01-04 DIAGNOSIS — N289 Disorder of kidney and ureter, unspecified: Secondary | ICD-10-CM | POA: Diagnosis not present

## 2022-01-04 DIAGNOSIS — H6121 Impacted cerumen, right ear: Secondary | ICD-10-CM | POA: Diagnosis not present

## 2022-01-04 NOTE — Assessment & Plan Note (Signed)
R side only interfering with hearing  After consent obtained- removed with simple ear irrigation and tolerated well  Hearing improved in office with nl TM on exam post procedure  Enc to use debrox if needed prn to prevent impaction  inst to update if any ear pain or other new symptoms

## 2022-01-04 NOTE — Progress Notes (Signed)
    Chronic Care Management Pharmacy Assistant   Name: Morgan [facility]  MRN: [id number] DOB: [birth year]  Reason for Encounter: CCM (Reschedule Appointment)  Called patient to reschedule her telephone appointment with [name] at 1:45 today as patient also has an appointment with Dr. [name] at 3:00. Patient has been rescheduled for 01/24/22 at 1:30.   [name], CPP notified  Marijean Niemann, [place] Clinical Pharmacy Assistant [phone]

## 2022-01-04 NOTE — Assessment & Plan Note (Signed)
Resolved last 2 draws thankfully  For nephrology f/u 01/22/22

## 2022-01-04 NOTE — Assessment & Plan Note (Signed)
bp is improved from July  BP: (!) 144/58    Still not quite at goal  Off arb due to worsening GFR Carvedilol 12.5 mg tid imdur 60 mg bid Hydralazine 50 mg tid  Compliant with medicines  Will f/u with nephrology on 8/22

## 2022-01-04 NOTE — Assessment & Plan Note (Signed)
GFR now 27.85 Avoiding nephrotoxins  Trying hard to keep up fluids  Overall less weak but still seemingly frail DM not in control- seeing endo and just started farxiga  For nephrology f/u on 01/22/22

## 2022-01-04 NOTE — Progress Notes (Deleted)
Chronic Care Management Pharmacy Note  01/04/2022 Name:  Morgan Hubbard MRN:  [id number] DOB:  1935/04/28  Summary: ***  Recommendations/Changes made from today's visit: ***  Plan: ***   Subjective: Morgan Hubbard is an 87 y.o. year old female who is a primary patient of Tower, [name], MD.  The CCM team was consulted for assistance with disease management and care coordination needs.    {CCMTELEPHONEFACETOFACE:21091510} for {CCMINITIALFOLLOWUPCHOICE:21091511} in response to provider referral for pharmacy case management and/or care coordination services.   Consent to Services:  {CCMCONSENTOPTIONS:25074}  Patient Care Team: Tower, [name], MD as PCP - General (Family Medicine) Carolan Clines, MD (Inactive) as Attending Physician (Urology) Minna Merritts, MD as Consulting Physician (Cardiology) Daryll Brod, MD as Referring Physician (Cardiology) Oneta Rack, MD as Consulting Physician (Dermatology) Ward, Jeani Hawking, MD as Referring Physician (Internal Medicine) Wynona Canes, MD as Referring Physician (Neurology) Kimmick, Linus Mako, MD as Referring Physician (Oncology) Gabriel Carina Betsey Holiday, MD as Physician Assistant (Internal Medicine) [name], MD as Consulting Physician (Family Medicine) Alvester Chou., DDS as Consulting Physician (Dentistry) Birder Robson, MD as Referring Physician (Ophthalmology) [name], [facility] as Pharmacist (Pharmacist)  Recent office visits: ***  Recent consult visits: [facility] visits: {Hospital DC Yes/No:25215}   Objective:  Lab Results  Component Value Date   CREATININE 1.67 (H) 01/01/2022   BUN 30 (H) 01/01/2022   GFR 27.85 (L) 01/01/2022   GFRNONAA 28 (L) 11/30/2021   GFRAA 41 (L) 06/15/2018   NA 140 01/01/2022   K 5.1 01/01/2022   CALCIUM 8.8 01/01/2022   CO2 31 01/01/2022   GLUCOSE 170 (H) 01/01/2022    Lab Results  Component Value Date/Time   HGBA1C 8.0 (H) 11/29/2021 04:16 PM    HGBA1C endo 01/03/2019 12:00 AM   HGBA1C 7.7 (H) 05/02/2017 09:40 AM   GFR 27.85 (L) 01/01/2022 10:29 AM   GFR 34.72 (L) 12/17/2021 12:44 PM   MICROALBUR 78.0 (H) 04/22/2016 02:55 PM    Last diabetic Eye exam:  Lab Results  Component Value Date/Time   HMDIABEYEEXA No Retinopathy 03/31/2020 12:00 AM    Last diabetic Foot exam: No results found for: "HMDIABFOOTEX"   Lab Results  Component Value Date   CHOL 99 06/12/2020   HDL 33.20 (L) 06/12/2020   LDLCALC 46 06/10/2019   LDLDIRECT 46.0 06/12/2020   TRIG 209.0 (H) 06/12/2020   CHOLHDL 3 06/12/2020       Latest Ref Rng & Units 11/29/2021   12:53 PM 11/28/2021    3:37 PM 11/05/2021   11:17 AM  Hepatic Function  Total Protein 6.5 - 8.1 g/dL 5.3  5.4  5.5   Albumin 3.5 - 5.0 g/dL 2.8  3.4  3.3   AST 15 - 41 U/L '21  19  15   ' ALT 0 - 44 U/L '16  13  8   ' Alk Phosphatase 38 - 126 U/L 69  66  55   Total Bilirubin 0.3 - 1.2 mg/dL 1.0  0.4  0.6   Bilirubin, Direct 0.0 - 0.2 mg/dL 0.1   0.1     Lab Results  Component Value Date/Time   TSH 2.34 11/28/2021 03:37 PM   TSH 2.62 06/16/2020 11:18 AM   FREET4 1.46 06/16/2020 11:18 AM       Latest Ref Rng & Units 12/07/2021    1:37 PM 11/30/2021    2:22 AM 11/29/2021   12:53 PM  CBC  WBC 3.8 - 10.8 Thousand/uL  6.4  6.2  7.4   Hemoglobin 11.7 - 15.5 g/dL 11.0  10.4  10.6   Hematocrit 35.0 - 45.0 % 34.4  31.6  32.5   Platelets 140 - 400 Thousand/uL 179  177  185     No results found for: "VD25OH"  Clinical ASCVD: {YES/NO:21197} The ASCVD Risk score (Arnett DK, et al., 2019) failed to calculate for the following reasons:   The 2019 ASCVD risk score is only valid for ages 87 to 64   The patient has a prior MI or stroke diagnosis       11/28/2021    3:03 PM 11/05/2021   10:32 AM 05/30/2021    1:21 PM  Depression screen PHQ 2/9  Decreased Interest 0 0 0  Down, Depressed, Hopeless 0 0 0  PHQ - 2 Score 0 0 0     ***Other: (CHADS2VASc if Afib, MMRC or CAT for COPD, ACT,  DEXA)  Social History   Tobacco Use  Smoking Status Never  Smokeless Tobacco Never   BP Readings from Last 3 Encounters:  12/25/21 (!) 166/70  12/17/21 (!) 142/80  12/07/21 (!) 160/62   Pulse Readings from Last 3 Encounters:  12/25/21 63  12/17/21 71  12/07/21 75   Wt Readings from Last 3 Encounters:  12/25/21 142 lb 6 oz (64.6 kg)  12/17/21 140 lb 12.8 oz (63.9 kg)  12/07/21 137 lb (62.1 kg)   BMI Readings from Last 3 Encounters:  12/25/21 26.90 kg/m  12/17/21 26.60 kg/m  12/07/21 25.89 kg/m    Assessment/Interventions: Review of patient past medical history, allergies, medications, health status, including review of consultants reports, laboratory and other test data, was performed as part of comprehensive evaluation and provision of chronic care management services.   SDOH:  (Social Determinants of Health) assessments and interventions performed: {yes/no:20286}  SDOH Screenings   Alcohol Screen: Low Risk  (05/30/2021)   Alcohol Screen    Last Alcohol Screening Score (AUDIT): 0  Depression (PHQ2-9): Low Risk  (11/28/2021)   Depression (PHQ2-9)    PHQ-2 Score: 0  Financial Resource Strain: Low Risk  (05/30/2021)   Overall Financial Resource Strain (CARDIA)    Difficulty of Paying Living Expenses: Not hard at all  Food Insecurity: No Food Insecurity (05/30/2021)   Hunger Vital Sign    Worried About Running Out of Food in the Last Year: Never true    [place] in the Last Year: Never true  Housing: Low Risk  (05/30/2021)   Housing    Last Housing Risk Score: 0  Physical Activity: Inactive (05/30/2021)   Exercise Vital Sign    Days of Exercise per Week: 0 days    Minutes of Exercise per Session: 0 min  Social Connections: Moderately Isolated (05/30/2021)   Social Connection and Isolation Panel [NHANES]    Frequency of Communication with Friends and Family: More than three times a week    Frequency of Social Gatherings with Friends and Family: Three  times a week    Attends Religious Services: Never    Active Member of Clubs or Organizations: No    Attends Archivist Meetings: Never    Marital Status: Married  Stress: No Stress Concern Present (05/30/2021)   [place]    Feeling of Stress : Not at all  Tobacco Use: Low Risk  (12/25/2021)   Patient History    Smoking Tobacco Use: Never    Smokeless Tobacco Use: Never  Passive Exposure: Not on file  Transportation Needs: No Transportation Needs (05/30/2021)   PRAPARE - Hydrologist (Medical): No    Lack of Transportation (Non-Medical): No    CCM Care Plan  Allergies  Allergen Reactions   Ace Inhibitors Swelling    REACTION: tongue swelling   Codeine Nausea Only   Hydrochlorothiazide Other (See Comments)    Dizziness/funny feeling   Insulin Detemir Itching   Lisinopril Swelling    Swelling of tongue   Nsaids Other (See Comments)    Renal issues   Tylenol [Acetaminophen] Other (See Comments)    Fatty liver    Medications Reviewed Today     Reviewed by Anselm Pancoast, CMA (Certified Medical Assistant) on 12/25/21 at 1017  Med List Status: <None>   Medication Order Taking? Sig Documenting Provider Last Dose Status Informant  aspirin EC 81 MG tablet [id number] Yes Take 1 tablet (81 mg total) by mouth daily. Swallow whole. Minna Merritts, MD Taking Active Multiple Informants  atorvastatin (LIPITOR) 80 MG tablet [id number] Yes Take by mouth. [provider] Taking Active Multiple Informants  carvedilol (COREG) 12.5 MG tablet [id number] Yes Take 1 tablet (12.5 mg total) by mouth 2 (two) times daily with a meal. Rockey Situ, Kathlene November, MD Taking Active Multiple Informants           Med Note Damita Dunnings, Elveria Rising   Fri Dec 07, 2021 12:51 PM)    cetirizine (ZYRTEC) 10 MG tablet [id number] Yes Take 10 mg by mouth daily. [provider] Taking Active Multiple  Informants  clotrimazole-betamethasone (LOTRISONE) cream [id number] Yes Apply 1 application topically as needed (groin).  [provider] Taking Active Multiple Informants           Med Note Ebony Hail, AMBER B   Wed Jun 10, 2018  5:40 PM)    famotidine (PEPCID) 20 MG tablet [id number] Yes Take 1 tablet (20 mg total) by mouth 2 (two) times daily. Tower, [name], MD Taking Active Multiple Informants  gabapentin (NEURONTIN) 300 MG capsule [id number] Yes Take 1 capsule (300 mg total) by mouth 3 (three) times daily. Tower, [name], MD Taking Active Multiple Informants  glimepiride (AMARYL) 1 MG tablet [id number] Yes Take 1-2 tablets (1-2 mg total) by mouth daily with breakfast. Tonia Ghent, MD Taking Active   hydrALAZINE (APRESOLINE) 50 MG tablet [id number] Yes Take 1 tablet (50 mg total) by mouth 2 (two) times daily. Take an extra hydralazine 50 mg as needed for pressure >170 Gollan, Kathlene November, MD Taking Active Multiple Informants  insulin degludec (TRESIBA) 100 UNIT/ML FlexTouch Pen [id number] Yes Inject into the skin. [provider] Taking Active   isosorbide mononitrate (IMDUR) 60 MG 24 hr tablet [id number] Yes Take 1 tablet (60 mg total) by mouth in the morning and at bedtime. Minna Merritts, MD Taking Active Multiple Informants  letrozole [facility]) 2.5 MG tablet [id number] Yes Take 2.5 mg by mouth daily. [provider] Taking Active Multiple Informants  linagliptin (TRADJENTA) 5 MG TABS tablet [id number] Yes Take 5 mg by mouth daily. [provider] Taking Active Multiple Informants  metFORMIN (GLUCOPHAGE) 1000 MG tablet [id number] No Held as of 12/05/21  Patient not taking: Reported on 12/25/2021   Tonia Ghent, MD Not Taking Active   mirabegron ER ([name]) 50 MG TB24 tablet [id number] Yes Take 1 tablet (50 mg total) by mouth at bedtime. Tower, [name], MD Taking Active Multiple Informants  nystatin cream (MYCOSTATIN) [id number] Yes Apply  1 application topically  2 (two) times daily as needed (irritation.).  [provider] Taking Active Multiple Informants  pantoprazole (PROTONIX) 40 MG tablet [id number] Yes Take 1 tablet by mouth once daily Tower, [name], MD Taking Active Multiple Informants  traMADol (ULTRAM) 50 MG tablet [id number] Yes Take 1 tablet (50 mg total) by mouth every 12 (twelve) hours as needed. Tonia Ghent, MD Taking Active   vitamin B-12 (CYANOCOBALAMIN) 1000 MCG tablet [id number] Yes Take 1,000 mcg by mouth daily.  [provider] Taking Active Multiple Informants  Vitamin D, Ergocalciferol, (DRISDOL) 1.25 MG (50000 UT) CAPS capsule [id number] Yes Take 50,000 Units by mouth every 7 (seven) days. [provider] Taking Active Multiple Informants           Med Note Geradine Girt Dec 07, 2021 12:51 PM)              Patient Active Problem List   Diagnosis Date Noted   Near syncope 11/29/2021   Hypotension 11/29/2021   Generalized weakness 11/28/2021   Encounter for medication review 11/28/2021   Nausea 11/05/2021   Shoulder pain 04/25/2021   History of breast cancer 06/18/2020   Anemia 06/16/2020   Elevated TSH 06/16/2020   Occipital neuralgia 02/14/2020   Hair loss 11/09/2018   Leg weakness, bilateral 06/29/2018   Pedal edema 06/22/2018   AICD (automatic cardioverter/defibrillator) present 06/11/2018   GERD (gastroesophageal reflux disease) 06/10/2018   AKI (acute kidney injury) ([place]) 06/10/2018   Cholecystitis 06/10/2018   Tingling 10/06/2017   Chronic back pain 62/37/6283   Chronic systolic (congestive) heart failure ([place]) 02/25/2017   Osteopenia 06/16/2016   Routine general medical examination at a health care facility 04/24/2016   Estrogen deficiency 04/24/2016   Urticaria 11/03/2015   Encounter for Medicare annual wellness exam 06/13/2014   Stroke, small vessel ([place]) 01/18/2014   Family history of hemochromatosis 01/18/2014   Cerebral thrombosis with cerebral infarction ([place])  01/14/2014   Expressive aphasia 01/13/2014   Nonischemic cardiomyopathy ([place]) 04/05/2013   Shortness of breath 09/03/2012   Elevated transaminase level 05/18/2012   Spinal stenosis of lumbar region 12/10/2011   Mixed incontinence urge and stress 12/10/2011   Renal insufficiency 11/04/2011   Goiter 01/14/2011   Fatty liver 08/28/2010   Type II diabetes mellitus with renal manifestations ([place]) 07/09/2010   Hyperlipidemia associated with type 2 diabetes mellitus ([place]) 07/09/2010   Hyperkalemia 07/09/2010   REFLEX SYMPATHETIC DYSTROPHY 07/09/2010   Neuropathy 07/09/2010   Essential hypertension 07/09/2010   CARDIOMYOPATHY 07/09/2010   OVERACTIVE BLADDER 07/09/2010    Immunization History  Administered Date(s) Administered   Fluad Quad(high Dose 65+) 02/14/2020, 02/15/2021   Influenza,inj,Quad PF,6+ Mos 05/24/2015, 04/03/2016, 03/09/2018   Influenza-Unspecified 03/29/2013, 04/11/2014, 04/03/2016, 03/17/2017   PFIZER(Purple Top)SARS-COV-2 Vaccination 07/16/2019, 08/06/2019, 03/03/2020   Pneumococcal Conjugate-13 06/13/2014   Pneumococcal Polysaccharide-23 01/14/2011    Conditions to be addressed/monitored:  {USCCMDZASSESSMENTOPTIONS:23563}  There are no care plans that you recently modified to display for this patient.    Medication Assistance: {MEDASSISTANCEINFO:25044}  Compliance/Adherence/Medication fill history: Care Gaps: ***  Star-Rating Drugs: ***  Patient's preferred pharmacy is:  [facility] [address], [place] - [place] [address] Phone: [phone] Fax: [phone]  [facility] [address] [place] [address] Phone: [phone] Fax: [phone]  Uses pill box? {Yes or If no, why not?:20788} Pt endorses ***% compliance  We discussed: {Pharmacy options:24294} Patient decided to: {US Pharmacy  YHOO:87579}  Care Plan and Follow Up Patient Decision:  {FOLLOWUP:24991}  Plan:  {CM FOLLOW UP PLAN:25073}  ***

## 2022-01-04 NOTE — Patient Instructions (Addendum)
Ask the endocrinologist about Glucerna and see if she likes that for a meal supplement  Unsure if insurance will cover it   Keep eating regular meals  Keep up fluids See nephrology as planned later this month for kidney care   Your ear looks clear   Blood pressure is improving a bit but still not quite at goal See the kidney doctor as planned

## 2022-01-04 NOTE — Progress Notes (Signed)
Subjective:    Patient ID: Morgan Hubbard, female    DOB: 17-Mar-1935, 87 y.o.   MRN: [id number]  HPI Pt presents with c/o ear fullness Also f/u of renal labs and K   Wt Readings from Last 3 Encounters:  01/04/22 141 lb 3.2 oz (64 kg)  12/25/21 142 lb 6 oz (64.6 kg)  12/17/21 140 lb 12.8 oz (63.9 kg)   26.68 kg/m   Saw endo yesterday- glucose is high since her hospitalization  Glucose is up and down  New-farxiga- very $$ but does not hurt the kidney   Fullness in R ear- cannot hear well out of it  No uri symptoms  Thinks it is from ear wax  No pain  Just cannot hear out of it  She gets some skin crust on top of ear as well   Doing fair  Is eating better now  [place] -has to be very careful     Lab Results  Component Value Date   CREATININE 1.67 (H) 01/01/2022   BUN 30 (H) 01/01/2022   NA 140 01/01/2022   K 5.1 01/01/2022   CL 104 01/01/2022   CO2 31 01/01/2022   Gfr of 27.85 K is back in nl range   Has nephrology visit on 8/22   Fluid intake encouraged  Grand daughter bought her a very large cup to measure it now    BP Readings from Last 3 Encounters:  01/04/22 (!) 144/58  12/25/21 (!) 166/70  12/17/21 (!) 142/80  Holding arb still due to reduced gfr   Carvedilol 12.5 mg tid Imdur 60 mg bid Hydralazine 50 mg tid   Pulse Readings from Last 3 Encounters:  01/04/22 69  12/25/21 63  12/17/21 71     Lab Results  Component Value Date   WBC 6.4 12/07/2021   HGB 11.0 (L) 12/07/2021   HCT 34.4 (L) 12/07/2021   MCV 95.0 12/07/2021   PLT 179 12/07/2021  ' Review of Systems     Objective:   Physical Exam Constitutional:      General: She is not in acute distress.    Appearance: Normal appearance. She is well-developed and normal weight. She is not ill-appearing or diaphoretic.  HENT:     Head: Normocephalic and atraumatic.     Ears:     Comments: Total cerumen impaction (dry) in R ear canal causing decreased hearing   After consent  obtained R ear simple ear irrigation performed with full resolution of cerumen impaction  Pt tolerated this well   Procedure: Cerumen Disimpaction  Warm water was applied and gentle ear lavage performed on   ear.    There were no complications and following the disimpaction the tympanic membrane were visible on the bilateral. Tympanic membranes are intact following the procedure.  Auditory canals are normal.  The patient reported relief of symptoms after removal of cerumen.    L ear canal is clear with nl app TM   Eyes:     Conjunctiva/sclera: Conjunctivae normal.     Pupils: Pupils are equal, round, and reactive to light.  Neck:     Thyroid: No thyromegaly.     Vascular: No carotid bruit or JVD.  Cardiovascular:     Rate and Rhythm: Normal rate and regular rhythm.     Heart sounds: Normal heart sounds.     No gallop.  Pulmonary:     Effort: Pulmonary effort is normal. No respiratory distress.     Breath sounds: Normal  breath sounds. No wheezing or rales.  Abdominal:     General: There is no distension or abdominal bruit.     Palpations: Abdomen is soft.  Musculoskeletal:     Cervical back: Normal range of motion and neck supple.     Right lower leg: No edema.     Left lower leg: No edema.  Lymphadenopathy:     Cervical: No cervical adenopathy.  Skin:    General: Skin is warm and dry.     Coloration: Skin is not pale.     Findings: No rash.  Neurological:     Mental Status: She is alert.     Cranial Nerves: No cranial nerve deficit.     Coordination: Coordination normal.     Deep Tendon Reflexes: Reflexes are normal and symmetric. Reflexes normal.  Psychiatric:        Mood and Affect: Mood normal.        Cognition and Memory: Cognition and memory normal.           Assessment & Plan:   Problem List Items Addressed This Visit       Cardiovascular and Mediastinum   Essential hypertension    bp is improved from July  BP: (!) 144/58    Still not quite at  goal  Off arb due to worsening GFR Carvedilol 12.5 mg tid imdur 60 mg bid Hydralazine 50 mg tid  Compliant with medicines  Will f/u with nephrology on 8/22        Nervous and Auditory   Cerumen impaction - Primary    R side only interfering with hearing  After consent obtained- removed with simple ear irrigation and tolerated well  Hearing improved in office with nl TM on exam post procedure  Enc to use debrox if needed prn to prevent impaction  inst to update if any ear pain or other new symptoms         Genitourinary   Renal insufficiency    GFR now 27.85 Avoiding nephrotoxins  Trying hard to keep up fluids  Overall less weak but still seemingly frail DM not in control- seeing endo and just started farxiga  For nephrology f/u on 01/22/22          Other   Hyperkalemia    Resolved last 2 draws thankfully  For nephrology f/u 01/22/22

## 2022-01-05 ENCOUNTER — Other Ambulatory Visit: Payer: Self-pay | Admitting: Family Medicine

## 2022-01-08 DIAGNOSIS — D631 Anemia in chronic kidney disease: Secondary | ICD-10-CM | POA: Diagnosis not present

## 2022-01-08 DIAGNOSIS — Z9181 History of falling: Secondary | ICD-10-CM | POA: Diagnosis not present

## 2022-01-08 DIAGNOSIS — I083 Combined rheumatic disorders of mitral, aortic and tricuspid valves: Secondary | ICD-10-CM | POA: Diagnosis not present

## 2022-01-08 DIAGNOSIS — I429 Cardiomyopathy, unspecified: Secondary | ICD-10-CM | POA: Diagnosis not present

## 2022-01-08 DIAGNOSIS — I959 Hypotension, unspecified: Secondary | ICD-10-CM | POA: Diagnosis not present

## 2022-01-08 DIAGNOSIS — N3946 Mixed incontinence: Secondary | ICD-10-CM | POA: Diagnosis not present

## 2022-01-08 DIAGNOSIS — E1169 Type 2 diabetes mellitus with other specified complication: Secondary | ICD-10-CM | POA: Diagnosis not present

## 2022-01-08 DIAGNOSIS — M6281 Muscle weakness (generalized): Secondary | ICD-10-CM | POA: Diagnosis not present

## 2022-01-08 DIAGNOSIS — M48061 Spinal stenosis, lumbar region without neurogenic claudication: Secondary | ICD-10-CM | POA: Diagnosis not present

## 2022-01-08 DIAGNOSIS — Z7984 Long term (current) use of oral hypoglycemic drugs: Secondary | ICD-10-CM | POA: Diagnosis not present

## 2022-01-08 DIAGNOSIS — E875 Hyperkalemia: Secondary | ICD-10-CM | POA: Diagnosis not present

## 2022-01-08 DIAGNOSIS — E1122 Type 2 diabetes mellitus with diabetic chronic kidney disease: Secondary | ICD-10-CM | POA: Diagnosis not present

## 2022-01-08 DIAGNOSIS — N3281 Overactive bladder: Secondary | ICD-10-CM | POA: Diagnosis not present

## 2022-01-08 DIAGNOSIS — E785 Hyperlipidemia, unspecified: Secondary | ICD-10-CM | POA: Diagnosis not present

## 2022-01-08 DIAGNOSIS — N184 Chronic kidney disease, stage 4 (severe): Secondary | ICD-10-CM | POA: Diagnosis not present

## 2022-01-08 DIAGNOSIS — E1142 Type 2 diabetes mellitus with diabetic polyneuropathy: Secondary | ICD-10-CM | POA: Diagnosis not present

## 2022-01-08 DIAGNOSIS — N318 Other neuromuscular dysfunction of bladder: Secondary | ICD-10-CM | POA: Diagnosis not present

## 2022-01-08 DIAGNOSIS — Z95810 Presence of automatic (implantable) cardiac defibrillator: Secondary | ICD-10-CM | POA: Diagnosis not present

## 2022-01-08 DIAGNOSIS — I5022 Chronic systolic (congestive) heart failure: Secondary | ICD-10-CM | POA: Diagnosis not present

## 2022-01-08 DIAGNOSIS — I110 Hypertensive heart disease with heart failure: Secondary | ICD-10-CM | POA: Diagnosis not present

## 2022-01-08 DIAGNOSIS — G8929 Other chronic pain: Secondary | ICD-10-CM | POA: Diagnosis not present

## 2022-01-08 DIAGNOSIS — M8580 Other specified disorders of bone density and structure, unspecified site: Secondary | ICD-10-CM | POA: Diagnosis not present

## 2022-01-08 DIAGNOSIS — M549 Dorsalgia, unspecified: Secondary | ICD-10-CM | POA: Diagnosis not present

## 2022-01-11 ENCOUNTER — Telehealth: Payer: Self-pay | Admitting: Family Medicine

## 2022-01-11 NOTE — Telephone Encounter (Signed)
Home Health verbal orders Caller Name: [place] Name: Myrtie Cruise number: [phone]  Requesting OT/PT/Skilled nursing/Social Work/Speech:  Reason: PT  Frequency: 1 time a week for 2 weeks with recertification for continued service.   Please forward to [facility] pool or providers CMA

## 2022-01-11 NOTE — Telephone Encounter (Signed)
Called and spoke with [facility] gave verbal okay for patient.

## 2022-01-11 NOTE — Telephone Encounter (Signed)
Is this okay to give

## 2022-01-11 NOTE — Telephone Encounter (Signed)
Please ok those verbal orders  

## 2022-01-14 ENCOUNTER — Telehealth: Payer: Medicare HMO

## 2022-01-14 DIAGNOSIS — M6281 Muscle weakness (generalized): Secondary | ICD-10-CM

## 2022-01-14 DIAGNOSIS — M549 Dorsalgia, unspecified: Secondary | ICD-10-CM | POA: Diagnosis not present

## 2022-01-14 DIAGNOSIS — E875 Hyperkalemia: Secondary | ICD-10-CM

## 2022-01-14 DIAGNOSIS — N318 Other neuromuscular dysfunction of bladder: Secondary | ICD-10-CM

## 2022-01-14 DIAGNOSIS — E1122 Type 2 diabetes mellitus with diabetic chronic kidney disease: Secondary | ICD-10-CM | POA: Diagnosis not present

## 2022-01-14 DIAGNOSIS — Z9181 History of falling: Secondary | ICD-10-CM

## 2022-01-14 DIAGNOSIS — I5022 Chronic systolic (congestive) heart failure: Secondary | ICD-10-CM | POA: Diagnosis not present

## 2022-01-14 DIAGNOSIS — N3281 Overactive bladder: Secondary | ICD-10-CM

## 2022-01-14 DIAGNOSIS — E1142 Type 2 diabetes mellitus with diabetic polyneuropathy: Secondary | ICD-10-CM

## 2022-01-14 DIAGNOSIS — D631 Anemia in chronic kidney disease: Secondary | ICD-10-CM | POA: Diagnosis not present

## 2022-01-14 DIAGNOSIS — Z7984 Long term (current) use of oral hypoglycemic drugs: Secondary | ICD-10-CM

## 2022-01-14 DIAGNOSIS — N184 Chronic kidney disease, stage 4 (severe): Secondary | ICD-10-CM | POA: Diagnosis not present

## 2022-01-14 DIAGNOSIS — I110 Hypertensive heart disease with heart failure: Secondary | ICD-10-CM | POA: Diagnosis not present

## 2022-01-14 DIAGNOSIS — M48061 Spinal stenosis, lumbar region without neurogenic claudication: Secondary | ICD-10-CM | POA: Diagnosis not present

## 2022-01-14 DIAGNOSIS — M8580 Other specified disorders of bone density and structure, unspecified site: Secondary | ICD-10-CM | POA: Diagnosis not present

## 2022-01-14 DIAGNOSIS — N3946 Mixed incontinence: Secondary | ICD-10-CM

## 2022-01-14 DIAGNOSIS — E785 Hyperlipidemia, unspecified: Secondary | ICD-10-CM

## 2022-01-14 DIAGNOSIS — Z95810 Presence of automatic (implantable) cardiac defibrillator: Secondary | ICD-10-CM

## 2022-01-14 DIAGNOSIS — E1169 Type 2 diabetes mellitus with other specified complication: Secondary | ICD-10-CM

## 2022-01-14 DIAGNOSIS — G8929 Other chronic pain: Secondary | ICD-10-CM | POA: Diagnosis not present

## 2022-01-14 DIAGNOSIS — I083 Combined rheumatic disorders of mitral, aortic and tricuspid valves: Secondary | ICD-10-CM | POA: Diagnosis not present

## 2022-01-14 DIAGNOSIS — I429 Cardiomyopathy, unspecified: Secondary | ICD-10-CM | POA: Diagnosis not present

## 2022-01-14 DIAGNOSIS — I959 Hypotension, unspecified: Secondary | ICD-10-CM | POA: Diagnosis not present

## 2022-01-18 DIAGNOSIS — M6281 Muscle weakness (generalized): Secondary | ICD-10-CM | POA: Diagnosis not present

## 2022-01-18 DIAGNOSIS — E1122 Type 2 diabetes mellitus with diabetic chronic kidney disease: Secondary | ICD-10-CM | POA: Diagnosis not present

## 2022-01-18 DIAGNOSIS — D631 Anemia in chronic kidney disease: Secondary | ICD-10-CM | POA: Diagnosis not present

## 2022-01-18 DIAGNOSIS — N3946 Mixed incontinence: Secondary | ICD-10-CM | POA: Diagnosis not present

## 2022-01-18 DIAGNOSIS — E1169 Type 2 diabetes mellitus with other specified complication: Secondary | ICD-10-CM | POA: Diagnosis not present

## 2022-01-18 DIAGNOSIS — N3281 Overactive bladder: Secondary | ICD-10-CM | POA: Diagnosis not present

## 2022-01-18 DIAGNOSIS — Z95810 Presence of automatic (implantable) cardiac defibrillator: Secondary | ICD-10-CM | POA: Diagnosis not present

## 2022-01-18 DIAGNOSIS — M48061 Spinal stenosis, lumbar region without neurogenic claudication: Secondary | ICD-10-CM | POA: Diagnosis not present

## 2022-01-18 DIAGNOSIS — I083 Combined rheumatic disorders of mitral, aortic and tricuspid valves: Secondary | ICD-10-CM | POA: Diagnosis not present

## 2022-01-18 DIAGNOSIS — M549 Dorsalgia, unspecified: Secondary | ICD-10-CM | POA: Diagnosis not present

## 2022-01-18 DIAGNOSIS — Z9181 History of falling: Secondary | ICD-10-CM | POA: Diagnosis not present

## 2022-01-18 DIAGNOSIS — E1142 Type 2 diabetes mellitus with diabetic polyneuropathy: Secondary | ICD-10-CM | POA: Diagnosis not present

## 2022-01-18 DIAGNOSIS — N184 Chronic kidney disease, stage 4 (severe): Secondary | ICD-10-CM | POA: Diagnosis not present

## 2022-01-18 DIAGNOSIS — I959 Hypotension, unspecified: Secondary | ICD-10-CM | POA: Diagnosis not present

## 2022-01-18 DIAGNOSIS — E875 Hyperkalemia: Secondary | ICD-10-CM | POA: Diagnosis not present

## 2022-01-18 DIAGNOSIS — E785 Hyperlipidemia, unspecified: Secondary | ICD-10-CM | POA: Diagnosis not present

## 2022-01-18 DIAGNOSIS — I5022 Chronic systolic (congestive) heart failure: Secondary | ICD-10-CM | POA: Diagnosis not present

## 2022-01-18 DIAGNOSIS — Z7984 Long term (current) use of oral hypoglycemic drugs: Secondary | ICD-10-CM | POA: Diagnosis not present

## 2022-01-18 DIAGNOSIS — I429 Cardiomyopathy, unspecified: Secondary | ICD-10-CM | POA: Diagnosis not present

## 2022-01-18 DIAGNOSIS — M8580 Other specified disorders of bone density and structure, unspecified site: Secondary | ICD-10-CM | POA: Diagnosis not present

## 2022-01-18 DIAGNOSIS — N318 Other neuromuscular dysfunction of bladder: Secondary | ICD-10-CM | POA: Diagnosis not present

## 2022-01-18 DIAGNOSIS — I110 Hypertensive heart disease with heart failure: Secondary | ICD-10-CM | POA: Diagnosis not present

## 2022-01-18 DIAGNOSIS — G8929 Other chronic pain: Secondary | ICD-10-CM | POA: Diagnosis not present

## 2022-01-21 ENCOUNTER — Telehealth: Payer: Self-pay

## 2022-01-21 DIAGNOSIS — E1142 Type 2 diabetes mellitus with diabetic polyneuropathy: Secondary | ICD-10-CM | POA: Diagnosis not present

## 2022-01-21 DIAGNOSIS — N318 Other neuromuscular dysfunction of bladder: Secondary | ICD-10-CM | POA: Diagnosis not present

## 2022-01-21 DIAGNOSIS — M6281 Muscle weakness (generalized): Secondary | ICD-10-CM | POA: Diagnosis not present

## 2022-01-21 DIAGNOSIS — E785 Hyperlipidemia, unspecified: Secondary | ICD-10-CM | POA: Diagnosis not present

## 2022-01-21 DIAGNOSIS — N3946 Mixed incontinence: Secondary | ICD-10-CM | POA: Diagnosis not present

## 2022-01-21 DIAGNOSIS — N184 Chronic kidney disease, stage 4 (severe): Secondary | ICD-10-CM | POA: Diagnosis not present

## 2022-01-21 DIAGNOSIS — E1169 Type 2 diabetes mellitus with other specified complication: Secondary | ICD-10-CM | POA: Diagnosis not present

## 2022-01-21 DIAGNOSIS — D631 Anemia in chronic kidney disease: Secondary | ICD-10-CM | POA: Diagnosis not present

## 2022-01-21 DIAGNOSIS — I110 Hypertensive heart disease with heart failure: Secondary | ICD-10-CM | POA: Diagnosis not present

## 2022-01-21 DIAGNOSIS — M8580 Other specified disorders of bone density and structure, unspecified site: Secondary | ICD-10-CM | POA: Diagnosis not present

## 2022-01-21 DIAGNOSIS — M549 Dorsalgia, unspecified: Secondary | ICD-10-CM | POA: Diagnosis not present

## 2022-01-21 DIAGNOSIS — Z7984 Long term (current) use of oral hypoglycemic drugs: Secondary | ICD-10-CM | POA: Diagnosis not present

## 2022-01-21 DIAGNOSIS — E875 Hyperkalemia: Secondary | ICD-10-CM | POA: Diagnosis not present

## 2022-01-21 DIAGNOSIS — M48061 Spinal stenosis, lumbar region without neurogenic claudication: Secondary | ICD-10-CM | POA: Diagnosis not present

## 2022-01-21 DIAGNOSIS — E1122 Type 2 diabetes mellitus with diabetic chronic kidney disease: Secondary | ICD-10-CM | POA: Diagnosis not present

## 2022-01-21 DIAGNOSIS — Z95810 Presence of automatic (implantable) cardiac defibrillator: Secondary | ICD-10-CM | POA: Diagnosis not present

## 2022-01-21 DIAGNOSIS — I5022 Chronic systolic (congestive) heart failure: Secondary | ICD-10-CM | POA: Diagnosis not present

## 2022-01-21 DIAGNOSIS — I083 Combined rheumatic disorders of mitral, aortic and tricuspid valves: Secondary | ICD-10-CM | POA: Diagnosis not present

## 2022-01-21 DIAGNOSIS — N3281 Overactive bladder: Secondary | ICD-10-CM | POA: Diagnosis not present

## 2022-01-21 DIAGNOSIS — I959 Hypotension, unspecified: Secondary | ICD-10-CM | POA: Diagnosis not present

## 2022-01-21 DIAGNOSIS — I429 Cardiomyopathy, unspecified: Secondary | ICD-10-CM | POA: Diagnosis not present

## 2022-01-21 DIAGNOSIS — Z9181 History of falling: Secondary | ICD-10-CM | POA: Diagnosis not present

## 2022-01-21 DIAGNOSIS — G8929 Other chronic pain: Secondary | ICD-10-CM | POA: Diagnosis not present

## 2022-01-21 NOTE — Chronic Care Management (AMB) (Signed)
Chronic Care Management Pharmacy Assistant   Name: Morgan Hubbard  MRN: [id number] DOB: 1935/01/14  Reason for Encounter: Reminder Call   Conditions to be addressed/monitored: HTN, HLD, and DMII   Recent office visits:  01/04/22-[name],MD(PCP)- right ear fullness(New-farxiga- very $$ but does not hurt the kidney )Gfr of 27.85-K is back in nl range -impaction removed with simple ear irrigation and tolerated well   Recent consult visits:  01/03/22-Anna Solum,MD(endo)- increase Tresiba to 16 units at bedtime, reduce glimepiride to '1mg'$  take 2 tablets in am, restart farxiga '5mg'$  take 1 tablet morning.She has been working with primary care provider and Cardiology on adjusting her BP medications. No longer taking losartan. F/u 4-6 weeks   Hospital visits:  None in previous 6 months  Medications: Outpatient Encounter Medications as of 01/21/2022  Medication Sig   aspirin EC 81 MG tablet Take 1 tablet (81 mg total) by mouth daily. Swallow whole.   atorvastatin (LIPITOR) 80 MG tablet Take 1 tablet (80 mg total) by mouth daily.   carvedilol (COREG) 12.5 MG tablet Take 1 tablet (12.5 mg total) by mouth in the morning, at noon, and at bedtime.   cetirizine (ZYRTEC) 10 MG tablet Take 10 mg by mouth daily.   clotrimazole-betamethasone (LOTRISONE) cream Apply 1 application topically as needed (groin).    dapagliflozin propanediol (FARXIGA) 5 MG TABS tablet Take 5 mg by mouth daily.   famotidine (PEPCID) 20 MG tablet Take 1 tablet by mouth twice daily   gabapentin (NEURONTIN) 300 MG capsule Take 1 capsule (300 mg total) by mouth 3 (three) times daily.   glimepiride (AMARYL) 1 MG tablet Take 1-2 tablets (1-2 mg total) by mouth daily with breakfast.   hydrALAZINE (APRESOLINE) 50 MG tablet Take 1 tablet (50 mg total) by mouth 3 (three) times daily.   insulin degludec (TRESIBA) 100 UNIT/ML FlexTouch Pen Inject into the skin.   isosorbide mononitrate (IMDUR) 60 MG 24 hr tablet Take 1 tablet (60 mg  total) by mouth in the morning and at bedtime.   letrozole (FEMARA) 2.5 MG tablet Take 2.5 mg by mouth daily.   linagliptin (TRADJENTA) 5 MG TABS tablet Take 5 mg by mouth daily.   metFORMIN (GLUCOPHAGE) 1000 MG tablet Held as of 12/05/21   mirabegron ER ([name]) 50 MG TB24 tablet Take 1 tablet (50 mg total) by mouth at bedtime.   nystatin cream (MYCOSTATIN) Apply 1 application topically 2 (two) times daily as needed (irritation.).    pantoprazole (PROTONIX) 40 MG tablet Take 1 tablet by mouth once daily   traMADol (ULTRAM) 50 MG tablet Take 1 tablet (50 mg total) by mouth every 12 (twelve) hours as needed.   vitamin B-12 (CYANOCOBALAMIN) 1000 MCG tablet Take 1,000 mcg by mouth daily.    Vitamin D, Ergocalciferol, (DRISDOL) 1.25 MG (50000 UT) CAPS capsule Take 50,000 Units by mouth every 7 (seven) days.   No facility-administered encounter medications on file as of 01/21/2022.    Morgan Hubbard was contacted to remind of upcoming telephone visit with [name] on 01/24/22 at 1:30pm. Patient was reminded to have any blood glucose and blood pressure readings available for review at appointment.   Patient confirmed appointment.  Are you having any problems with your medications? No   Do you have any concerns you like to discuss with the pharmacist? No  CCM referral has been placed prior to visit?  Yes   Star Rating Drugs: Medication:  Last Fill: Day Supply Atorvastatin '80mg'$  12/25/21 90 [place]  $'5mg'W$   01/03/22  90 Glimepiride '1mg'$  01/03/22  90 Tresiba  12/17/21 108 Tradjenta  11/06/21  90 Metformin '1000mg'$  12/23/21 90 Losartan/HCT  12/23/21 [place], CPP notified  [name], Opp  [phone]

## 2022-01-22 DIAGNOSIS — E1122 Type 2 diabetes mellitus with diabetic chronic kidney disease: Secondary | ICD-10-CM | POA: Diagnosis not present

## 2022-01-22 DIAGNOSIS — N183 Chronic kidney disease, stage 3 unspecified: Secondary | ICD-10-CM | POA: Diagnosis not present

## 2022-01-22 DIAGNOSIS — E875 Hyperkalemia: Secondary | ICD-10-CM | POA: Diagnosis not present

## 2022-01-22 DIAGNOSIS — K219 Gastro-esophageal reflux disease without esophagitis: Secondary | ICD-10-CM | POA: Diagnosis not present

## 2022-01-22 DIAGNOSIS — N3281 Overactive bladder: Secondary | ICD-10-CM | POA: Diagnosis not present

## 2022-01-22 DIAGNOSIS — R809 Proteinuria, unspecified: Secondary | ICD-10-CM | POA: Diagnosis not present

## 2022-01-22 DIAGNOSIS — D649 Anemia, unspecified: Secondary | ICD-10-CM | POA: Diagnosis not present

## 2022-01-22 DIAGNOSIS — I129 Hypertensive chronic kidney disease with stage 1 through stage 4 chronic kidney disease, or unspecified chronic kidney disease: Secondary | ICD-10-CM | POA: Diagnosis not present

## 2022-01-23 ENCOUNTER — Other Ambulatory Visit: Payer: Self-pay | Admitting: Family Medicine

## 2022-01-23 NOTE — Telephone Encounter (Signed)
Last filled on 10/23/21 Last ov 01/04/22

## 2022-01-24 ENCOUNTER — Ambulatory Visit (INDEPENDENT_AMBULATORY_CARE_PROVIDER_SITE_OTHER): Payer: Medicare HMO | Admitting: Pharmacist

## 2022-01-24 DIAGNOSIS — I5022 Chronic systolic (congestive) heart failure: Secondary | ICD-10-CM

## 2022-01-24 DIAGNOSIS — E1169 Type 2 diabetes mellitus with other specified complication: Secondary | ICD-10-CM

## 2022-01-24 DIAGNOSIS — N318 Other neuromuscular dysfunction of bladder: Secondary | ICD-10-CM

## 2022-01-24 DIAGNOSIS — I428 Other cardiomyopathies: Secondary | ICD-10-CM

## 2022-01-24 DIAGNOSIS — N184 Chronic kidney disease, stage 4 (severe): Secondary | ICD-10-CM

## 2022-01-24 DIAGNOSIS — I10 Essential (primary) hypertension: Secondary | ICD-10-CM

## 2022-01-24 DIAGNOSIS — N289 Disorder of kidney and ureter, unspecified: Secondary | ICD-10-CM

## 2022-01-24 DIAGNOSIS — E1129 Type 2 diabetes mellitus with other diabetic kidney complication: Secondary | ICD-10-CM

## 2022-01-24 DIAGNOSIS — M85839 Other specified disorders of bone density and structure, unspecified forearm: Secondary | ICD-10-CM

## 2022-01-24 NOTE — Progress Notes (Signed)
Chronic Care Management Pharmacy Note  01/31/2022 Name:  Morgan Hubbard MRN:  [id number] DOB:  08-16-1934  Summary: CCM Initial visit -Pt reports recently Nephrology started amlodipine 2.5 mg, updated med list. -Pt is confused over carvedilol dose (12.5 vs 25 mg, TID vs 2AM, 1 PM), she has seen Dr Rockey Situ and Dr Leonides Schanz ([facility]) and they have recommended different dosing. -SBP elevated 150s-160s. Discussed goal < 140/90 -Pt is on [name] for OAB, which can contribute to high BP. Of note, [name] dose higher than 25 mg is also not recommended with GFR < 30. Switching to selective B3 agonist may help BP. -Pt reports Farxiga and [name] are cost-prohibitive. Enrolled patient in [name] for [name]; unfortunately [name] has no cost-savings programs  Recommendations/Changes made from today's visit: -Advised pt to take carvedilol 12.5 mg - 2 in AM, 1 in PM as directed at most recent cardiologist visit -Switch [name] to [name] 75 mg (lacks BP effects); tier exception submitted, awaiting response. If [name] is denied, would recommend to reduce [name] to 25 mg for GFR <30 (consulting with PCP - see phone note)  Plan: -[name] will call patient 2 weeks for BP update -Pharmacist follow up televisit scheduled for 1 month    Subjective: Morgan Hubbard is an 87 y.o. year old female who is a primary patient of Tower, [name], MD.  The CCM team was consulted for assistance with disease management and care coordination needs.    Engaged with patient by telephone for initial visit in response to provider referral for pharmacy case management and/or care coordination services.   Consent to Services:  The patient was given the following information about Chronic Care Management services today, agreed to services, and gave verbal consent: 1. CCM service includes personalized support from designated clinical staff supervised by the primary care provider, including  individualized plan of care and coordination with other care providers 2. 24/7 contact phone numbers for assistance for urgent and routine care needs. 3. Service will only be billed when office clinical staff spend 20 minutes or more in a month to coordinate care. 4. Only one practitioner may furnish and bill the service in a calendar month. 5.The patient may stop CCM services at any time (effective at the end of the month) by phone call to the office staff. 6. The patient will be responsible for cost sharing (co-pay) of up to 20% of the service fee (after annual deductible is met). Patient agreed to services and consent obtained.  Patient Care Team: Tower, [name], MD as PCP - General (Family Medicine) Carolan Clines, MD (Inactive) as Attending Physician (Urology) Minna Merritts, MD as Consulting Physician (Cardiology) Daryll Brod, MD as Referring Physician (Cardiology) Oneta Rack, MD as Consulting Physician (Dermatology) Ward, Jeani Hawking, MD as Referring Physician (Internal Medicine) Wynona Canes, MD as Referring Physician (Neurology) Kimmick, Linus Mako, MD as Referring Physician (Oncology) Gabriel Carina Betsey Holiday, MD as Physician Assistant (Internal Medicine) [name], MD as Consulting Physician (Family Medicine) Alvester Chou., DDS as Consulting Physician (Dentistry) Birder Robson, MD as Referring Physician (Ophthalmology) [name], [name] as Pharmacist (Pharmacist)  Recent office visits: 01/04/22-[name],MD(PCP)- right ear fullness(New-farxiga- very $$ but does not hurt the kidney )Gfr of 27.85-K is back in nl range -impaction removed with simple ear irrigation and tolerated well   12/17/21-[name],MD(PCP)-f/u AKI,Labs,May need insulin in the future ,start PT,increase fluid intake.(Potassium is back into the normal range,Kidney numbers are stable to slightly improved )  12/07/21-Graham Duncan,MD(fam med)-hospital f/u Hold metformin for now.   Continue linagliptin.  I would try restarting glimepiride 64m with breakfast.  If sugar is still above 200 after 1 week, then increase to 2 mg with breakfast. Would stay off losartan and hydrochlorothiazide for now.Labs ordered  11/28/21-[name],MD(PCP)-f/u hospital visit,Renal labs today (Worse kidney function - was sent to ER today with malaise and bp issues and dehydration ) If improved will start back losartan hctz - [facility] 11/05/21-[name],MD(PCP)-GI upset,labs(Potassium is mildly high,stable kidney numbers ,Liver tests are stable but protein levels are low Other labs are ok -Adding pepcid 20 mg twice daily  09/19/21-Spencer Copland,MD(fam med)- acute left shoulder pain,rest 2 weeks, no medication changes 07/16/21-Spencer Copland,MD(fam med)-right shoulder pain,xrays,injection with kenalog and lidocaine.  Recent consult visits: 01/03/22-Anna Solum,MD(endo)- increase Tresiba to 16 units at bedtime, reduce glimepiride to 124mtake 2 tablets in am, restart farxiga 20m61make 1 tablet morning.She has been working with primary care provider and Cardiology on adjusting her BP medications. No longer taking losartan. F/u 4-6 weeks   12/25/21-Timothy Gollan,MD(cardio)-f/u heart disease,EKG, imaging reviewed,Recommend she increase Hydrlazine 50 TID,Recommend she increase Coreg 12.5 TID,monitor BP daily,f/u 6 months 12/17/21-Anna Solum,MD(endo)-Telephone call to report BG's running high 11/16/21-[facility] ED-elevated BP,discontinue tylenol and use asa for HA.No admission 11/06/21-Anna Solum,MD-(endo)-f/u DM,Stop glimepiride,Will replace with Tradjenta 5 mg daliy. F/u 3 months w labs 08/01/21-Cary Ward,MD(cardio)-f/u, start some PT,ride stationary bike,no medication changes, f/u 6 months 07/27/21-Anna Solum,MD(endo)-f/u DM,Add Trulicity 0.77.79 weekly.f/u 3 months   Hospital visits: Medication Reconciliation was completed by comparing discharge summary, patient's EMR and Pharmacy list, and upon discussion with  patient.   Admitted to the ED on 11/29/21 due to Hypotension. Discharge date was 11/30/21. [facility]   Medications Discontinued at Hospital Discharge: -Stopped glimepiride             Losartan/HCTZ             Trulicity   Medications that remain the same after Hospital Discharge:??  -All other medications will remain the same.     Admitted to the hospital on 11/19/21 due to SOB. Discharge date was 11/24/21. [facility] -started coreg new dose 220m39mD   Medication Changes at Hospital Discharge: -Changed Metformin ( on hold)   Medications Discontinued at Hospital Discharge: -Stopped Losartan/Hctz             Plavix             Tramadol             Furosemide   Medications that remain the same after Hospital Discharge:??  -All other medications will remain the same.      Objective:  Lab Results  Component Value Date   CREATININE 1.67 (H) 01/01/2022   BUN 30 (H) 01/01/2022   GFR 27.85 (L) 01/01/2022   GFRNONAA 28 (L) 11/30/2021   GFRAA 41 (L) 06/15/2018   NA 140 01/01/2022   K 5.1 01/01/2022   CALCIUM 8.8 01/01/2022   CO2 31 01/01/2022   GLUCOSE 170 (H) 01/01/2022    Lab Results  Component Value Date/Time   HGBA1C 8.0 (H) 11/29/2021 04:16 PM   HGBA1C endo 01/03/2019 12:00 AM   HGBA1C 7.7 (H) 05/02/2017 09:40 AM   GFR 27.85 (L) 01/01/2022 10:29 AM   GFR 34.72 (L) 12/17/2021 12:44 PM   MICROALBUR 78.0 (H) 04/22/2016 02:55 PM  Last diabetic Eye exam:  Lab Results  Component Value Date/Time   HMDIABEYEEXA No Retinopathy 03/31/2020 12:00 AM    Last diabetic Foot exam: No results found for: "HMDIABFOOTEX"   Lab Results  Component Value Date   CHOL 99 06/12/2020   HDL 33.20 (L) 06/12/2020   LDLCALC 46 06/10/2019   LDLDIRECT 46.0 06/12/2020   TRIG 209.0 (H) 06/12/2020   CHOLHDL 3 06/12/2020       Latest Ref Rng & Units 11/29/2021    12:53 PM 11/28/2021    3:37 PM 11/05/2021   11:17 AM  Hepatic Function  Total Protein 6.5 - 8.1 g/dL 5.3  5.4  5.5   Albumin 3.5 - 5.0 g/dL 2.8  3.4  3.3   AST 15 - 41 U/L '21  19  15   ' ALT 0 - 44 U/L '16  13  8   ' Alk Phosphatase 38 - 126 U/L 69  66  55   Total Bilirubin 0.3 - 1.2 mg/dL 1.0  0.4  0.6   Bilirubin, Direct 0.0 - 0.2 mg/dL 0.1   0.1     Lab Results  Component Value Date/Time   TSH 2.34 11/28/2021 03:37 PM   TSH 2.62 06/16/2020 11:18 AM   FREET4 1.46 06/16/2020 11:18 AM       Latest Ref Rng & Units 12/07/2021    1:37 PM 11/30/2021    2:22 AM 11/29/2021   12:53 PM  CBC  WBC 3.8 - 10.8 Thousand/uL 6.4  6.2  7.4   Hemoglobin 11.7 - 15.5 g/dL 11.0  10.4  10.6   Hematocrit 35.0 - 45.0 % 34.4  31.6  32.5   Platelets 140 - 400 Thousand/uL 179  177  185     No results found for: "VD25OH"  Clinical ASCVD: Yes  The ASCVD Risk score (Arnett DK, et al., 2019) failed to calculate for the following reasons:   The 2019 ASCVD risk score is only valid for ages 50 to 69   The patient has a prior MI or stroke diagnosis       11/28/2021    3:03 PM 11/05/2021   10:32 AM 05/30/2021    1:21 PM  Depression screen PHQ 2/9  Decreased Interest 0 0 0  Down, Depressed, Hopeless 0 0 0  PHQ - 2 Score 0 0 0     Social History   Tobacco Use  Smoking Status Never  Smokeless Tobacco Never   BP Readings from Last 3 Encounters:  01/04/22 (!) 144/58  12/25/21 (!) 166/70  12/17/21 (!) 142/80   Pulse Readings from Last 3 Encounters:  01/04/22 69  12/25/21 63  12/17/21 71   Wt Readings from Last 3 Encounters:  01/04/22 141 lb 3.2 oz (64 kg)  12/25/21 142 lb 6 oz (64.6 kg)  12/17/21 140 lb 12.8 oz (63.9 kg)   BMI Readings from Last 3 Encounters:  01/04/22 26.68 kg/m  12/25/21 26.90 kg/m  12/17/21 26.60 kg/m    Assessment/Interventions: Review of patient past medical history, allergies, medications, health status, including review of consultants reports, laboratory and other test  data, was performed as part of comprehensive evaluation and provision of chronic care management services.   SDOH:  (Social Determinants of Health) assessments and interventions performed: No - done 05/2021  SDOH Screenings   Alcohol Screen: Low Risk  (05/30/2021)   Alcohol Screen    Last Alcohol Screening Score (AUDIT): 0  Depression (PHQ2-9): Low Risk  (11/28/2021)   Depression (PHQ2-9)  PHQ-2 Score: 0  Financial Resource Strain: Low Risk  (05/30/2021)   Overall Financial Resource Strain (CARDIA)    Difficulty of Paying Living Expenses: Not hard at all  Food Insecurity: No Food Insecurity (05/30/2021)   Hunger Vital Sign    Worried About Running Out of Food in the Last Year: Never true    [place] in the Last Year: Never true  Housing: Low Risk  (05/30/2021)   Housing    Last Housing Risk Score: 0  Physical Activity: Inactive (05/30/2021)   Exercise Vital Sign    Days of Exercise per Week: 0 days    Minutes of Exercise per Session: 0 min  Social Connections: Moderately Isolated (05/30/2021)   Social Connection and Isolation Panel [NHANES]    Frequency of Communication with Friends and Family: More than three times a week    Frequency of Social Gatherings with Friends and Family: Three times a week    Attends Religious Services: Never    Active Member of Clubs or Organizations: No    Attends Archivist Meetings: Never    Marital Status: Married  Stress: No Stress Concern Present (05/30/2021)   [place]    Feeling of Stress : Not at all  Tobacco Use: Low Risk  (01/04/2022)   Patient History    Smoking Tobacco Use: Never    Smokeless Tobacco Use: Never    Passive Exposure: Not on file  Transportation Needs: No Transportation Needs (05/30/2021)   PRAPARE - Transportation    Lack of Transportation (Medical): No    Lack of Transportation (Non-Medical): No    CCM Care Plan  Allergies   Allergen Reactions   Ace Inhibitors Swelling    REACTION: tongue swelling   Lisinopril Swelling    Swelling of tongue   Insulin Detemir Itching   Nsaids Other (See Comments)    Renal issues   Codeine Nausea Only   Hydrochlorothiazide Other (See Comments)    Dizziness/funny feeling   Tylenol [Acetaminophen] Other (See Comments)    Fatty liver - use with caution    Medications Reviewed Today     Reviewed by [name], [facility] (Pharmacist) on 01/24/22 at 1451  Med List Status: <None>   Medication Order Taking? Sig Documenting Provider Last Dose Status Informant  amLODipine (NORVASC) 2.5 MG tablet [id number] Yes Take 2.5 mg by mouth at bedtime. Justin Mend, MD Taking Active   aspirin EC 81 MG tablet [id number] Yes Take 1 tablet (81 mg total) by mouth daily. Swallow whole. Minna Merritts, MD Taking Active Multiple Informants  atorvastatin (LIPITOR) 80 MG tablet [id number] Yes Take 1 tablet (80 mg total) by mouth daily. Minna Merritts, MD Taking Active   carvedilol (COREG) 12.5 MG tablet [id number] Yes Take 1 tablet (12.5 mg total) by mouth in the morning, at noon, and at bedtime.  Patient taking differently: Take 25 mg by mouth 2 (two) times daily with a meal. 2 in AM, 1 in PM   Gollan, Kathlene November, MD Taking Active   cetirizine (ZYRTEC) 10 MG tablet [id number] Yes Take 10 mg by mouth daily. [provider] Taking Active Multiple Informants  clotrimazole-betamethasone (LOTRISONE) cream [id number] Yes Apply 1 application topically as needed (groin).  [provider] Taking Active Multiple Informants           Med Note Ebony Hail, AMBER B   Wed Jun 10, 2018  5:40 PM)    dapagliflozin  propanediol (FARXIGA) 5 MG TABS tablet [id number] Yes Take 5 mg by mouth daily. [provider] Taking Active   famotidine (PEPCID) 20 MG tablet [id number] Yes Take 1 tablet by mouth twice daily Tower, [name], MD Taking Active   gabapentin (NEURONTIN) 300 MG capsule [id number]  Yes Take 1 capsule (300 mg total) by mouth 3 (three) times daily. Tower, [name], MD Taking Active Multiple Informants  glimepiride (AMARYL) 1 MG tablet [id number] Yes Take 1-2 tablets (1-2 mg total) by mouth daily with breakfast. Tonia Ghent, MD Taking Active   hydrALAZINE (APRESOLINE) 50 MG tablet [id number] Yes Take 1 tablet (50 mg total) by mouth 3 (three) times daily. Minna Merritts, MD Taking Active   insulin degludec (TRESIBA) 100 UNIT/ML FlexTouch Pen [id number] Yes Inject 16 Units into the skin at bedtime. [provider] Taking Active   isosorbide mononitrate (IMDUR) 60 MG 24 hr tablet [id number] Yes Take 1 tablet (60 mg total) by mouth in the morning and at bedtime. Minna Merritts, MD Taking Active   letrozole [facility]) 2.5 MG tablet [id number] Yes Take 2.5 mg by mouth daily. [provider] Taking Active Multiple Informants  linagliptin (TRADJENTA) 5 MG TABS tablet [id number] Yes Take 5 mg by mouth daily. [provider] Taking Active Multiple Informants  mirabegron ER ([name]) 50 MG TB24 tablet [id number] Yes Take 1 tablet (50 mg total) by mouth at bedtime. Tower, [name], MD Taking Active Multiple Informants  nystatin cream (MYCOSTATIN) [id number] Yes Apply 1 application topically 2 (two) times daily as needed (irritation.).  [provider] Taking Active Multiple Informants  pantoprazole (PROTONIX) 40 MG tablet [id number] Yes Take 1 tablet by mouth once daily Tower, [name], MD Taking Active Multiple Informants  traMADol (ULTRAM) 50 MG tablet [id number] Yes TAKE 1 TO 2 TABLETS BY MOUTH EVERY 8 HOURS AS NEEDED FOR MODERATE TO SEVERE PAIN Tower, [name], MD Taking Active   vitamin B-12 (CYANOCOBALAMIN) 1000 MCG tablet [id number] Yes Take 1,000 mcg by mouth daily.  [provider] Taking Active Multiple Informants  Vitamin D, Ergocalciferol, (DRISDOL) 1.25 MG (50000 UT) CAPS capsule [id number] Yes Take 50,000 Units by mouth every 7 (seven) days.  [provider] Taking Active Multiple Informants           Med Note Geradine Girt Dec 07, 2021 12:51 PM)              Patient Active Problem List   Diagnosis Date Noted   Near syncope 11/29/2021   Hypotension 11/29/2021   Generalized weakness 11/28/2021   Encounter for medication review 11/28/2021   Nausea 11/05/2021   Shoulder pain 04/25/2021   History of breast cancer 06/18/2020   Anemia 06/16/2020   Elevated TSH 06/16/2020   Occipital neuralgia 02/14/2020   Hair loss 11/09/2018   Leg weakness, bilateral 06/29/2018   Pedal edema 06/22/2018   AICD (automatic cardioverter/defibrillator) present 06/11/2018   GERD (gastroesophageal reflux disease) 06/10/2018   AKI (acute kidney injury) ([facility]) 06/10/2018   Cholecystitis 06/10/2018   Tingling 10/06/2017   Chronic back pain 16/96/7893   Chronic systolic (congestive) heart failure ([place]) 02/25/2017   Osteopenia 06/16/2016   Routine general medical examination at a health care facility 04/24/2016   Estrogen deficiency 04/24/2016   Urticaria 11/03/2015   Cerumen impaction 03/10/2015   Encounter for Medicare annual wellness exam 06/13/2014   Stroke, small vessel ([place]) 01/18/2014   Family history of hemochromatosis 01/18/2014   Cerebral thrombosis with cerebral  infarction ([place]) 01/14/2014   Expressive aphasia 01/13/2014   Nonischemic cardiomyopathy ([place]) 04/05/2013   Shortness of breath 09/03/2012   Elevated transaminase level 05/18/2012   Spinal stenosis of lumbar region 12/10/2011   Mixed incontinence urge and stress 12/10/2011   Renal insufficiency 11/04/2011   Goiter 01/14/2011   Fatty liver 08/28/2010   Type II diabetes mellitus with renal manifestations ([place]) 07/09/2010   Hyperlipidemia associated with type 2 diabetes mellitus ([place]) 07/09/2010   Hyperkalemia 07/09/2010   REFLEX SYMPATHETIC DYSTROPHY 07/09/2010   Neuropathy 07/09/2010   Essential hypertension 07/09/2010   CARDIOMYOPATHY 07/09/2010    OVERACTIVE BLADDER 07/09/2010    Immunization History  Administered Date(s) Administered   Fluad Quad(high Dose 65+) 02/14/2020, 02/15/2021   Influenza,inj,Quad PF,6+ Mos 05/24/2015, 04/03/2016, 03/09/2018   Influenza-Unspecified 03/29/2013, 04/11/2014, 04/03/2016, 03/17/2017   PFIZER(Purple Top)SARS-COV-2 Vaccination 07/16/2019, 08/06/2019, 03/03/2020   Pneumococcal Conjugate-13 06/13/2014   Pneumococcal Polysaccharide-23 01/14/2011    Conditions to be addressed/monitored:  Hypertension, Hyperlipidemia, Diabetes, Heart Failure, Coronary Artery Disease, and Chronic Kidney Disease  Care Plan : [place]  Updates made by [name], [place] since 01/31/2022 12:00 AM     Problem: Hypertension, Hyperlipidemia, Diabetes, Heart Failure, Coronary Artery Disease, and Chronic Kidney Disease   Priority: High     Long-Range Goal: Disease Management   Start Date: 01/24/2022  Expected End Date: 02/01/2023  This Visit's Progress: On track  Priority: High  Note:   Current Barriers:  Unable to independently afford treatment regimen Unable to maintain control of BP  Pharmacist Clinical Goal(s):  Patient will verbalize ability to afford treatment regimen achieve improvement in BP as evidenced by home readings < 140/90 through collaboration with PharmD and provider.   Interventions: 1:1 collaboration with Tower, [name], MD regarding development and update of comprehensive plan of care as evidenced by provider attestation and co-signature Inter-disciplinary care team collaboration (see longitudinal plan of care) Comprehensive medication review performed; medication list updated in electronic medical record  Hypertension / Heart Failure (BP goal <130/80) -Uncontrolled - pt checking BP several times a week; she is confused about carvedilol dose, she sees 2 cardiologists and they have recommended different doses (25 mg vs 12.5 mg, TID vs 2 AM, 1 PM); she also was recently  started on amlodipine 2.5 mg per nephrology -Current home readings: SBP 155, 166, 169, 150 -Denies hypotensive symptoms, but does report headache when BP is elevated. -Last ejection fraction: 45-50% (Date: 11/30/21) - improved from 15-20% (2015) -HF type: Preserved; NYHA Class: II-III -Current treatment: Carvedilol 12.5 mg - 2 AM, 1 PM - Appropriate, Query Effective Hydralazine 50 mg TID -Appropriate, Query Effective Isosorbide [place] 60 mg BID -Appropriate, Query Effective Amlodipine 2.5 mg daily HS -Appropriate, Query Effective Farxiga 5 mg daily -Appropriate, Query Effective -Medications previously tried: furosemide, HCTZ, losartan, lisinopril (tongue swelling) -Educated on BP goals and benefits of medications for prevention of heart attack, stroke and kidney damage; -Counseled to monitor BP at home daily for 1-2 weeks -Reviewed finances - pt enrolled in [employer] for [place] (Cardiomyopathy) -Advised to take carvedilol 12.5 mg - 2 tab AM, 1 tab PM per most recent cardiology visit -Discussed [name] may be contributing to high BP as a Beta-3 agonist (see OAB section below) -Recommended to continue current medication; updated med list  Hyperlipidemia: (LDL goal < 70) -Controlled - LDL 46 (06/2020) at goal, due for repeat lipid panel -Hx stroke, NICM -Current treatment: Atorvastatin 80 mg daily - Appropriate, Effective, Safe, Accessible Aspirin 81 mg  daily - Appropriate, Effective, Safe, Accessible -Medications previously tried: pravastatin  -Educated on Cholesterol goals;  -Recommended to continue current medication  Diabetes (A1c goal <8%) -Controlled - A1c 8.0% (11/2021) reasonable; she has not been taking metformin since July 5th due to GFR < 30 -Current home glucose readings: 102, 313 (fasting) -Current medications: Farxiga 5 mg daily - Appropriate, Query Effective (for DM) Glimepiride 1 mg (1-2 tab) daily AM - Appropriate, Effective, Safe, Accessible Tresiba 16 units HS  -Appropriate, Effective, Safe, Accessible Tradjenta 5 mg daily -Appropriate, Effective, Safe, Accessible Metformin 1000 mg - held as of 12/05/21 -Medications previously tried: Geneticist, molecular (cost) -Reviewed [name] is likely not very effective for DM control with GFR <30, though it should still remain on board for help with HF and CKD -Recommended to continue current medication  Chronic Kidney Disease Stage 4  -All medications assessed for renal dosing and appropriateness in chronic kidney disease. -Pt is on Farxiga. Pt  is not on ACE/ARB due to hx of tongue swelling with lisinopril. -Recommended to continue current medication  GERD (Goal: minimize symptoms of reflux ) -Controlled - per pt report -Current treatment  Pantoprazole 40 mg daily - Appropriate, Effective, Safe, Accessible Famotidine 20 mg BID -Appropriate, Effective, Safe, Accessible -Medications previously tried: n/a -Hx of Bleeds/ulcers: No -Counseled on small meals, elevating head, and sleeping on left side -Recommended to continue current medication  OAB (Goal: minimize nocturia) -Not ideally controlled - pt did try to come off of this, urinary frequency was worse without it -Current treatment  [name] 50 mg daily HS - Appropriate, Effective, Safe, Accessible -Medications previously tried: Toviaz, oxybutynin, tolterodine  -Reviewed [name] as nonselective beta-3 agonist can contribute to high BP; pt may be better off with selective beta-3 agonist ([name] - tier 4 with insurance. Pt has failed multiple other OAB therapies so may qualify for tier exception) -Recommend to switch [name] to [name] 75 mg daily; Submitted tier exception, will await response before prescribing  Neuropathy (Goal: manage symptoms) -Controlled -Current treatment  Gabapentin 300 mg TID - Appropriate, Effective, Safe, Accessible -Medications previously tried: n/a  -Recommended to continue current medication  Breast cancer (Goal: prevent  recurrence) -Controlled  -Follows with oncology (Dr Rip Harbour, [facility]). Per oncology, plan for 7-year course of Letrozole, will be completed 2024 -Current treatment  Letrozole 2.5 mg daily - Appropriate, Effective, Safe, Accessible -Medications previously tried: n/a  -Recommended to continue current medication  Health Maintenance -Vaccine gaps: None -Current therapy:  Cetirizine 10 mg Vitamin D 50,000 IU weekly Vitamin B12 1000 mcg daily  Patient Goals/Self-Care Activities Patient will:  - take medications as prescribed as evidenced by patient report and record review focus on medication adherence by routine check glucose daily, document, and provide at future appointments check blood pressure daily, document, and provide at future appointments collaborate with provider on medication access solutions        Medication Assistance:  [place] (Cardiomyopathy) 12/25/21 - 12/25/22 CARD NO. [id number]   BIN [id number]   PCN [id number]   PC GROUP [id number]  Compliance/Adherence/Medication fill history: Care Gaps: Mammogram (due 11/14/20) Eye exam (due 03/31/21) Foot exam (due 06/16/21)  Star-Rating Drugs: Atorvastatin - PDC 94% Farxiga - PDC 75% (LF 01/03/22 x 90 ds) Tradjenta - PDC 100% Glimepiride - PDC 100%  Medication Access: Within the past 30 days, how often has patient missed a dose of medication? 0 Is a pillbox or other method used to improve adherence? Yes  Factors that may affect medication adherence? Confusion over  doses prescribed Are meds synced by current pharmacy? No  Are meds delivered by current pharmacy? No  Does patient experience delays in picking up medications due to transportation concerns? No   [facility] Reviewed: Is patient disadvantaged to use [facility]?: No  Current Rx insurance plan: [employer] [place] Name and location of Current pharmacy:  [facility] [address], [place] - [place] [address] Phone: [phone] Fax: [phone]  [facility] services reviewed with patient today?: No  Patient requests to transfer care to [facility]?: No  Reason patient declined to change pharmacies: Not mentioned at this visit   Care Plan and Follow Up Patient Decision:  Patient agrees to Care Plan and Follow-up.  Plan: Telephone follow up appointment with care management team member scheduled for:  1 month  [name], PharmD, [facility] Clinical Pharmacist [facility] at [facility] [place] [phone]

## 2022-01-25 ENCOUNTER — Encounter: Payer: Self-pay | Admitting: Family Medicine

## 2022-01-28 DIAGNOSIS — E1122 Type 2 diabetes mellitus with diabetic chronic kidney disease: Secondary | ICD-10-CM | POA: Diagnosis not present

## 2022-01-28 DIAGNOSIS — M6281 Muscle weakness (generalized): Secondary | ICD-10-CM | POA: Diagnosis not present

## 2022-01-28 DIAGNOSIS — E1169 Type 2 diabetes mellitus with other specified complication: Secondary | ICD-10-CM | POA: Diagnosis not present

## 2022-01-28 DIAGNOSIS — Z7984 Long term (current) use of oral hypoglycemic drugs: Secondary | ICD-10-CM | POA: Diagnosis not present

## 2022-01-28 DIAGNOSIS — M48061 Spinal stenosis, lumbar region without neurogenic claudication: Secondary | ICD-10-CM | POA: Diagnosis not present

## 2022-01-28 DIAGNOSIS — I083 Combined rheumatic disorders of mitral, aortic and tricuspid valves: Secondary | ICD-10-CM | POA: Diagnosis not present

## 2022-01-28 DIAGNOSIS — M8580 Other specified disorders of bone density and structure, unspecified site: Secondary | ICD-10-CM | POA: Diagnosis not present

## 2022-01-28 DIAGNOSIS — I959 Hypotension, unspecified: Secondary | ICD-10-CM | POA: Diagnosis not present

## 2022-01-28 DIAGNOSIS — N184 Chronic kidney disease, stage 4 (severe): Secondary | ICD-10-CM | POA: Diagnosis not present

## 2022-01-28 DIAGNOSIS — I429 Cardiomyopathy, unspecified: Secondary | ICD-10-CM | POA: Diagnosis not present

## 2022-01-28 DIAGNOSIS — G8929 Other chronic pain: Secondary | ICD-10-CM | POA: Diagnosis not present

## 2022-01-28 DIAGNOSIS — N3281 Overactive bladder: Secondary | ICD-10-CM | POA: Diagnosis not present

## 2022-01-28 DIAGNOSIS — N318 Other neuromuscular dysfunction of bladder: Secondary | ICD-10-CM | POA: Diagnosis not present

## 2022-01-28 DIAGNOSIS — D631 Anemia in chronic kidney disease: Secondary | ICD-10-CM | POA: Diagnosis not present

## 2022-01-28 DIAGNOSIS — Z9181 History of falling: Secondary | ICD-10-CM | POA: Diagnosis not present

## 2022-01-28 DIAGNOSIS — N3946 Mixed incontinence: Secondary | ICD-10-CM | POA: Diagnosis not present

## 2022-01-28 DIAGNOSIS — M549 Dorsalgia, unspecified: Secondary | ICD-10-CM | POA: Diagnosis not present

## 2022-01-28 DIAGNOSIS — I110 Hypertensive heart disease with heart failure: Secondary | ICD-10-CM | POA: Diagnosis not present

## 2022-01-28 DIAGNOSIS — E785 Hyperlipidemia, unspecified: Secondary | ICD-10-CM | POA: Diagnosis not present

## 2022-01-28 DIAGNOSIS — I5022 Chronic systolic (congestive) heart failure: Secondary | ICD-10-CM | POA: Diagnosis not present

## 2022-01-28 DIAGNOSIS — E875 Hyperkalemia: Secondary | ICD-10-CM | POA: Diagnosis not present

## 2022-01-28 DIAGNOSIS — Z95810 Presence of automatic (implantable) cardiac defibrillator: Secondary | ICD-10-CM | POA: Diagnosis not present

## 2022-01-28 DIAGNOSIS — E1142 Type 2 diabetes mellitus with diabetic polyneuropathy: Secondary | ICD-10-CM | POA: Diagnosis not present

## 2022-01-30 ENCOUNTER — Telehealth: Payer: Self-pay | Admitting: Family Medicine

## 2022-01-30 DIAGNOSIS — N318 Other neuromuscular dysfunction of bladder: Secondary | ICD-10-CM

## 2022-01-30 NOTE — Telephone Encounter (Signed)
Pt called stating she needed to speak with pharmacist about medication. Call back # is [phone]

## 2022-01-31 DIAGNOSIS — I130 Hypertensive heart and chronic kidney disease with heart failure and stage 1 through stage 4 chronic kidney disease, or unspecified chronic kidney disease: Secondary | ICD-10-CM

## 2022-01-31 DIAGNOSIS — E1140 Type 2 diabetes mellitus with diabetic neuropathy, unspecified: Secondary | ICD-10-CM

## 2022-01-31 DIAGNOSIS — I5020 Unspecified systolic (congestive) heart failure: Secondary | ICD-10-CM | POA: Diagnosis not present

## 2022-01-31 DIAGNOSIS — N184 Chronic kidney disease, stage 4 (severe): Secondary | ICD-10-CM

## 2022-01-31 DIAGNOSIS — E785 Hyperlipidemia, unspecified: Secondary | ICD-10-CM | POA: Diagnosis not present

## 2022-01-31 DIAGNOSIS — E1122 Type 2 diabetes mellitus with diabetic chronic kidney disease: Secondary | ICD-10-CM

## 2022-01-31 DIAGNOSIS — H34832 Tributary (branch) retinal vein occlusion, left eye, with macular edema: Secondary | ICD-10-CM | POA: Diagnosis not present

## 2022-01-31 LAB — HM DIABETES EYE EXAM

## 2022-01-31 NOTE — Telephone Encounter (Signed)
Yes, do you need the px now or later?

## 2022-01-31 NOTE — Telephone Encounter (Signed)
Discussed switching [name] to [place] since [name] can contribute to high blood pressures, [name] does not have this issue. Pt would like to try [name] - this is Tier 4 with her insurance, but she has tried/failed multiple OAB therapies so may qualify for tier exception.  Tier exception submitted for [name] 75 mg daily. Will await response.  Of note - if patient must remain on [name], she is currently taking 50 mg which is higher than the recommended 25 mg for GFR < 30. Would recommend to reduce dose to 25 mg if [name] is denied.

## 2022-01-31 NOTE — Patient Instructions (Signed)
Visit Information  Phone number for Pharmacist: [phone]   Goals Addressed   None     Care Plan : [place]  Updates made by [name], RPH since 01/31/2022 12:00 AM     Problem: Hypertension, Hyperlipidemia, Diabetes, Heart Failure, Coronary Artery Disease, and Chronic Kidney Disease   Priority: High     Long-Range Goal: Disease Management   Start Date: 01/24/2022  Expected End Date: 02/01/2023  This Visit's Progress: On track  Priority: High  Note:   Current Barriers:  Unable to independently afford treatment regimen Unable to maintain control of BP  Pharmacist Clinical Goal(s):  Patient will verbalize ability to afford treatment regimen achieve improvement in BP as evidenced by home readings < 140/90 through collaboration with PharmD and provider.   Interventions: 1:1 collaboration with Tower, [name], MD regarding development and update of comprehensive plan of care as evidenced by provider attestation and co-signature Inter-disciplinary care team collaboration (see longitudinal plan of care) Comprehensive medication review performed; medication list updated in electronic medical record  Hypertension / Heart Failure (BP goal <130/80) -Uncontrolled - pt checking BP several times a week; she is confused about carvedilol dose, she sees 2 cardiologists and they have recommended different doses (25 mg vs 12.5 mg, TID vs 2 AM, 1 PM); she also was recently started on amlodipine 2.5 mg per nephrology -Current home readings: SBP 155, 166, 169, 150 -Denies hypotensive symptoms, but does report headache when BP is elevated. -Last ejection fraction: 45-50% (Date: 11/30/21) - improved from 15-20% (2015) -HF type: Preserved; NYHA Class: II-III -Current treatment: Carvedilol 12.5 mg - 2 AM, 1 PM - Appropriate, Query Effective Hydralazine 50 mg TID -Appropriate, Query Effective Isosorbide MN 60 mg BID -Appropriate, Query Effective Amlodipine 2.5 mg daily HS  -Appropriate, Query Effective Farxiga 5 mg daily -Appropriate, Query Effective -Medications previously tried: furosemide, HCTZ, losartan, lisinopril (tongue swelling) -Educated on BP goals and benefits of medications for prevention of heart attack, stroke and kidney damage; -Counseled to monitor BP at home daily for 1-2 weeks -Reviewed finances - pt enrolled in [employer] for [place] (Cardiomyopathy) -Advised to take carvedilol 12.5 mg - 2 tab AM, 1 tab PM per most recent cardiology visit -Discussed [name] may be contributing to high BP as a Beta-3 agonist (see OAB section below) -Recommended to continue current medication; updated med list  Hyperlipidemia: (LDL goal < 70) -Controlled - LDL 46 (06/2020) at goal, due for repeat lipid panel -Hx stroke, NICM -Current treatment: Atorvastatin 80 mg daily - Appropriate, Effective, Safe, Accessible Aspirin 81 mg daily - Appropriate, Effective, Safe, Accessible -Medications previously tried: pravastatin  -Educated on Cholesterol goals;  -Recommended to continue current medication  Diabetes (A1c goal <8%) -Controlled - A1c 8.0% (11/2021) reasonable; she has not been taking metformin since July 5th due to GFR < 30 -Current home glucose readings: 102, 313 (fasting) -Current medications: Farxiga 5 mg daily - Appropriate, Query Effective (for DM) Glimepiride 1 mg (1-2 tab) daily AM - Appropriate, Effective, Safe, Accessible Tresiba 16 units HS -Appropriate, Effective, Safe, Accessible Tradjenta 5 mg daily -Appropriate, Effective, Safe, Accessible Metformin 1000 mg - held as of 12/05/21 -Medications previously tried: Geneticist, molecular (cost) -Reviewed [name] is likely not very effective for DM control with GFR <30, though it should still remain on board for help with HF and CKD -Recommended to continue current medication  Chronic Kidney Disease Stage 4  -All medications assessed for renal dosing and appropriateness in chronic kidney disease. -Pt  is  on Farxiga. Pt  is not on ACE/ARB due to hx of tongue swelling with lisinopril. -Recommended to continue current medication  GERD (Goal: minimize symptoms of reflux ) -Controlled - per pt report -Current treatment  Pantoprazole 40 mg daily - Appropriate, Effective, Safe, Accessible Famotidine 20 mg BID -Appropriate, Effective, Safe, Accessible -Medications previously tried: n/a -Hx of Bleeds/ulcers: No -Counseled on small meals, elevating head, and sleeping on left side -Recommended to continue current medication  OAB (Goal: minimize nocturia) -Not ideally controlled - pt did try to come off of this, urinary frequency was worse without it -Current treatment  [name] 50 mg daily HS - Appropriate, Effective, Safe, Accessible -Medications previously tried: Toviaz, oxybutynin, tolterodine  -Reviewed [name] as nonselective beta-3 agonist can contribute to high BP; pt may be better off with selective beta-3 agonist ([name] - tier 4 with insurance. Pt has failed multiple other OAB therapies so may qualify for tier exception) -Recommend to switch [name] to [name] 75 mg daily; Submitted tier exception, will await response before prescribing  Neuropathy (Goal: manage symptoms) -Controlled -Current treatment  Gabapentin 300 mg TID - Appropriate, Effective, Safe, Accessible -Medications previously tried: n/a  -Recommended to continue current medication  Breast cancer (Goal: prevent recurrence) -Controlled  -Follows with oncology (Dr Rip Harbour, [facility]). Per oncology, plan for 7-year course of Letrozole, will be completed 2024 -Current treatment  Letrozole 2.5 mg daily - Appropriate, Effective, Safe, Accessible -Medications previously tried: n/a  -Recommended to continue current medication  Health Maintenance -Vaccine gaps: None -Current therapy:  Cetirizine 10 mg Vitamin D 50,000 IU weekly Vitamin B12 1000 mcg daily  Patient Goals/Self-Care Activities Patient will:  - take  medications as prescribed as evidenced by patient report and record review focus on medication adherence by routine check glucose daily, document, and provide at future appointments check blood pressure daily, document, and provide at future appointments collaborate with provider on medication access solutions        Patient verbalizes understanding of instructions and care plan provided today and agrees to view in [place]. Active MyChart status and patient understanding of how to access instructions and care plan via MyChart confirmed with patient.    Telephone follow up appointment with pharmacy team member scheduled for:  1 month  [name], PharmD, [facility] Clinical Pharmacist [facility] at [facility] [phone]

## 2022-02-01 DIAGNOSIS — G9389 Other specified disorders of brain: Secondary | ICD-10-CM | POA: Diagnosis not present

## 2022-02-01 DIAGNOSIS — Z170 Estrogen receptor positive status [ER+]: Secondary | ICD-10-CM | POA: Diagnosis not present

## 2022-02-01 DIAGNOSIS — M47812 Spondylosis without myelopathy or radiculopathy, cervical region: Secondary | ICD-10-CM | POA: Diagnosis not present

## 2022-02-01 DIAGNOSIS — M4812 Ankylosing hyperostosis [Forestier], cervical region: Secondary | ICD-10-CM | POA: Diagnosis not present

## 2022-02-01 DIAGNOSIS — G44309 Post-traumatic headache, unspecified, not intractable: Secondary | ICD-10-CM | POA: Diagnosis not present

## 2022-02-01 DIAGNOSIS — S0990XA Unspecified injury of head, initial encounter: Secondary | ICD-10-CM | POA: Diagnosis not present

## 2022-02-01 DIAGNOSIS — Z905 Acquired absence of kidney: Secondary | ICD-10-CM | POA: Diagnosis not present

## 2022-02-01 DIAGNOSIS — C50311 Malignant neoplasm of lower-inner quadrant of right female breast: Secondary | ICD-10-CM | POA: Diagnosis not present

## 2022-02-01 DIAGNOSIS — W1839XA Other fall on same level, initial encounter: Secondary | ICD-10-CM | POA: Diagnosis not present

## 2022-02-01 DIAGNOSIS — S199XXA Unspecified injury of neck, initial encounter: Secondary | ICD-10-CM | POA: Diagnosis not present

## 2022-02-01 DIAGNOSIS — W19XXXA Unspecified fall, initial encounter: Secondary | ICD-10-CM | POA: Diagnosis not present

## 2022-02-01 DIAGNOSIS — R911 Solitary pulmonary nodule: Secondary | ICD-10-CM | POA: Diagnosis not present

## 2022-02-01 DIAGNOSIS — M4802 Spinal stenosis, cervical region: Secondary | ICD-10-CM | POA: Diagnosis not present

## 2022-02-01 DIAGNOSIS — Y92009 Unspecified place in unspecified non-institutional (private) residence as the place of occurrence of the external cause: Secondary | ICD-10-CM | POA: Diagnosis not present

## 2022-02-01 NOTE — Telephone Encounter (Signed)
Please alert patient we will be sending in the [place] to replace [name], I am just waiting on insurance approval which may take another day or two. As soon as the approval comes through I will send Rx to the pharmacy. She does not have to refill [name].

## 2022-02-05 NOTE — Progress Notes (Signed)
Patient has been informed and express understanding.    [name], CPP notified  Marijean Niemann, [place] Clinical Pharmacy Assistant [phone]

## 2022-02-06 DIAGNOSIS — E1122 Type 2 diabetes mellitus with diabetic chronic kidney disease: Secondary | ICD-10-CM | POA: Diagnosis not present

## 2022-02-06 DIAGNOSIS — N1832 Chronic kidney disease, stage 3b: Secondary | ICD-10-CM | POA: Diagnosis not present

## 2022-02-07 ENCOUNTER — Telehealth: Payer: Self-pay | Admitting: Family Medicine

## 2022-02-07 DIAGNOSIS — E875 Hyperkalemia: Secondary | ICD-10-CM | POA: Diagnosis not present

## 2022-02-07 DIAGNOSIS — M549 Dorsalgia, unspecified: Secondary | ICD-10-CM | POA: Diagnosis not present

## 2022-02-07 DIAGNOSIS — G8929 Other chronic pain: Secondary | ICD-10-CM | POA: Diagnosis not present

## 2022-02-07 DIAGNOSIS — I110 Hypertensive heart disease with heart failure: Secondary | ICD-10-CM | POA: Diagnosis not present

## 2022-02-07 DIAGNOSIS — N3946 Mixed incontinence: Secondary | ICD-10-CM | POA: Diagnosis not present

## 2022-02-07 DIAGNOSIS — I429 Cardiomyopathy, unspecified: Secondary | ICD-10-CM | POA: Diagnosis not present

## 2022-02-07 DIAGNOSIS — E1142 Type 2 diabetes mellitus with diabetic polyneuropathy: Secondary | ICD-10-CM | POA: Diagnosis not present

## 2022-02-07 DIAGNOSIS — Z9181 History of falling: Secondary | ICD-10-CM | POA: Diagnosis not present

## 2022-02-07 DIAGNOSIS — N3281 Overactive bladder: Secondary | ICD-10-CM | POA: Diagnosis not present

## 2022-02-07 DIAGNOSIS — N318 Other neuromuscular dysfunction of bladder: Secondary | ICD-10-CM | POA: Diagnosis not present

## 2022-02-07 DIAGNOSIS — M8580 Other specified disorders of bone density and structure, unspecified site: Secondary | ICD-10-CM | POA: Diagnosis not present

## 2022-02-07 DIAGNOSIS — M48061 Spinal stenosis, lumbar region without neurogenic claudication: Secondary | ICD-10-CM | POA: Diagnosis not present

## 2022-02-07 DIAGNOSIS — E1122 Type 2 diabetes mellitus with diabetic chronic kidney disease: Secondary | ICD-10-CM | POA: Diagnosis not present

## 2022-02-07 DIAGNOSIS — I083 Combined rheumatic disorders of mitral, aortic and tricuspid valves: Secondary | ICD-10-CM | POA: Diagnosis not present

## 2022-02-07 DIAGNOSIS — I5022 Chronic systolic (congestive) heart failure: Secondary | ICD-10-CM | POA: Diagnosis not present

## 2022-02-07 DIAGNOSIS — Z95810 Presence of automatic (implantable) cardiac defibrillator: Secondary | ICD-10-CM | POA: Diagnosis not present

## 2022-02-07 DIAGNOSIS — D631 Anemia in chronic kidney disease: Secondary | ICD-10-CM | POA: Diagnosis not present

## 2022-02-07 DIAGNOSIS — Z7984 Long term (current) use of oral hypoglycemic drugs: Secondary | ICD-10-CM | POA: Diagnosis not present

## 2022-02-07 DIAGNOSIS — E785 Hyperlipidemia, unspecified: Secondary | ICD-10-CM | POA: Diagnosis not present

## 2022-02-07 DIAGNOSIS — M6281 Muscle weakness (generalized): Secondary | ICD-10-CM | POA: Diagnosis not present

## 2022-02-07 DIAGNOSIS — I959 Hypotension, unspecified: Secondary | ICD-10-CM | POA: Diagnosis not present

## 2022-02-07 DIAGNOSIS — N184 Chronic kidney disease, stage 4 (severe): Secondary | ICD-10-CM | POA: Diagnosis not present

## 2022-02-07 DIAGNOSIS — E1169 Type 2 diabetes mellitus with other specified complication: Secondary | ICD-10-CM | POA: Diagnosis not present

## 2022-02-07 NOTE — Telephone Encounter (Signed)
Please ok those verbal orders  

## 2022-02-07 NOTE — Telephone Encounter (Signed)
Home Health verbal orders [place] Agency Name: [facility]  Callback number: [phone]  Requesting OT/PT/Skilled nursing/Social Work/Speech:  Reason:PT  Frequency: 1X w  4W   Please forward to [facility] pool or providers CMA

## 2022-02-07 NOTE — Telephone Encounter (Signed)
VO given.

## 2022-02-08 ENCOUNTER — Telehealth: Payer: Self-pay

## 2022-02-08 MED ORDER — GEMTESA 75 MG PO TABS: 75.0000 mg | ORAL_TABLET | Freq: Every day | ORAL | 3 refills | Status: DC

## 2022-02-08 NOTE — Telephone Encounter (Signed)
[name] tier exception approved, it is now Tier 3 (same as [name]).  Ordered Rx to [facility] as previously agreed upon.

## 2022-02-08 NOTE — Addendum Note (Signed)
Addended by: [name] on: 02/08/2022 04:52 PM   Modules accepted: Orders

## 2022-02-08 NOTE — Progress Notes (Signed)
Contacted [employer] to check on status of tier exception for [name] '75mg'$ . Pharmacist reviewed application and moved to tier 3 drug under case # [id number].  [name], CPP notified  [name], [place]  [phone]

## 2022-02-08 NOTE — Telephone Encounter (Signed)
-----   Message from [name], [facility] sent at 02/08/2022  9:12 AM EDT ----- Regarding: call insurance I submitted tier exception for [name] on 8/31 through covermymeds, I haven't received a response and it's been 5 business days.   Please call [employer] and check status of this: [phone] Covermymeds Key: [id number]

## 2022-02-11 NOTE — Telephone Encounter (Signed)
Patient contacted and given instructions on picking up the [name] medication   [name], CPP notified  [name], [place]  [phone]

## 2022-02-11 NOTE — Telephone Encounter (Signed)
[name] Rx was sent to [facility]

## 2022-02-12 ENCOUNTER — Telehealth: Payer: Self-pay | Admitting: Family Medicine

## 2022-02-12 DIAGNOSIS — E875 Hyperkalemia: Secondary | ICD-10-CM | POA: Diagnosis not present

## 2022-02-12 DIAGNOSIS — N3281 Overactive bladder: Secondary | ICD-10-CM | POA: Diagnosis not present

## 2022-02-12 DIAGNOSIS — Z9181 History of falling: Secondary | ICD-10-CM | POA: Diagnosis not present

## 2022-02-12 DIAGNOSIS — I429 Cardiomyopathy, unspecified: Secondary | ICD-10-CM | POA: Diagnosis not present

## 2022-02-12 DIAGNOSIS — N3946 Mixed incontinence: Secondary | ICD-10-CM | POA: Diagnosis not present

## 2022-02-12 DIAGNOSIS — E1165 Type 2 diabetes mellitus with hyperglycemia: Secondary | ICD-10-CM | POA: Diagnosis not present

## 2022-02-12 DIAGNOSIS — I5022 Chronic systolic (congestive) heart failure: Secondary | ICD-10-CM | POA: Diagnosis not present

## 2022-02-12 DIAGNOSIS — M6281 Muscle weakness (generalized): Secondary | ICD-10-CM | POA: Diagnosis not present

## 2022-02-12 DIAGNOSIS — M8580 Other specified disorders of bone density and structure, unspecified site: Secondary | ICD-10-CM | POA: Diagnosis not present

## 2022-02-12 DIAGNOSIS — N184 Chronic kidney disease, stage 4 (severe): Secondary | ICD-10-CM | POA: Diagnosis not present

## 2022-02-12 DIAGNOSIS — G8929 Other chronic pain: Secondary | ICD-10-CM | POA: Diagnosis not present

## 2022-02-12 DIAGNOSIS — E785 Hyperlipidemia, unspecified: Secondary | ICD-10-CM | POA: Diagnosis not present

## 2022-02-12 DIAGNOSIS — E1142 Type 2 diabetes mellitus with diabetic polyneuropathy: Secondary | ICD-10-CM | POA: Diagnosis not present

## 2022-02-12 DIAGNOSIS — I959 Hypotension, unspecified: Secondary | ICD-10-CM | POA: Diagnosis not present

## 2022-02-12 DIAGNOSIS — M48061 Spinal stenosis, lumbar region without neurogenic claudication: Secondary | ICD-10-CM | POA: Diagnosis not present

## 2022-02-12 DIAGNOSIS — I083 Combined rheumatic disorders of mitral, aortic and tricuspid valves: Secondary | ICD-10-CM | POA: Diagnosis not present

## 2022-02-12 DIAGNOSIS — Z95810 Presence of automatic (implantable) cardiac defibrillator: Secondary | ICD-10-CM | POA: Diagnosis not present

## 2022-02-12 DIAGNOSIS — D631 Anemia in chronic kidney disease: Secondary | ICD-10-CM | POA: Diagnosis not present

## 2022-02-12 DIAGNOSIS — I10 Essential (primary) hypertension: Secondary | ICD-10-CM | POA: Diagnosis not present

## 2022-02-12 DIAGNOSIS — E1122 Type 2 diabetes mellitus with diabetic chronic kidney disease: Secondary | ICD-10-CM | POA: Diagnosis not present

## 2022-02-12 DIAGNOSIS — I110 Hypertensive heart disease with heart failure: Secondary | ICD-10-CM | POA: Diagnosis not present

## 2022-02-12 DIAGNOSIS — E1169 Type 2 diabetes mellitus with other specified complication: Secondary | ICD-10-CM | POA: Diagnosis not present

## 2022-02-12 DIAGNOSIS — M549 Dorsalgia, unspecified: Secondary | ICD-10-CM | POA: Diagnosis not present

## 2022-02-12 DIAGNOSIS — Z7984 Long term (current) use of oral hypoglycemic drugs: Secondary | ICD-10-CM | POA: Diagnosis not present

## 2022-02-12 DIAGNOSIS — E1129 Type 2 diabetes mellitus with other diabetic kidney complication: Secondary | ICD-10-CM | POA: Diagnosis not present

## 2022-02-12 DIAGNOSIS — N318 Other neuromuscular dysfunction of bladder: Secondary | ICD-10-CM | POA: Diagnosis not present

## 2022-02-12 DIAGNOSIS — Z794 Long term (current) use of insulin: Secondary | ICD-10-CM | POA: Diagnosis not present

## 2022-02-12 DIAGNOSIS — R809 Proteinuria, unspecified: Secondary | ICD-10-CM | POA: Diagnosis not present

## 2022-02-12 NOTE — Addendum Note (Signed)
Addended by: [name] on: 02/12/2022 04:37 PM   Modules accepted: Orders

## 2022-02-12 NOTE — Telephone Encounter (Signed)
Pt reports she saw Dr Gabriel Carina today and [place] is being changed to Trulicity. She asks about help with cost - discussed Trulicity does not currently have an assistance program but Ozempic dose (if household income < 78,880). Pt will get income documents together and let me know if they will qualify.Marland Kitchen

## 2022-02-12 NOTE — Telephone Encounter (Signed)
Pt called in requesting a call would like to discuss Medication .Please advise [phone]

## 2022-02-14 DIAGNOSIS — D485 Neoplasm of uncertain behavior of skin: Secondary | ICD-10-CM | POA: Diagnosis not present

## 2022-02-14 DIAGNOSIS — D2272 Melanocytic nevi of left lower limb, including hip: Secondary | ICD-10-CM | POA: Diagnosis not present

## 2022-02-14 DIAGNOSIS — R208 Other disturbances of skin sensation: Secondary | ICD-10-CM | POA: Diagnosis not present

## 2022-02-14 DIAGNOSIS — D0439 Carcinoma in situ of skin of other parts of face: Secondary | ICD-10-CM | POA: Diagnosis not present

## 2022-02-14 DIAGNOSIS — D2262 Melanocytic nevi of left upper limb, including shoulder: Secondary | ICD-10-CM | POA: Diagnosis not present

## 2022-02-14 DIAGNOSIS — L538 Other specified erythematous conditions: Secondary | ICD-10-CM | POA: Diagnosis not present

## 2022-02-14 DIAGNOSIS — D2261 Melanocytic nevi of right upper limb, including shoulder: Secondary | ICD-10-CM | POA: Diagnosis not present

## 2022-02-14 DIAGNOSIS — L820 Inflamed seborrheic keratosis: Secondary | ICD-10-CM | POA: Diagnosis not present

## 2022-02-14 DIAGNOSIS — Z85828 Personal history of other malignant neoplasm of skin: Secondary | ICD-10-CM | POA: Diagnosis not present

## 2022-02-19 DIAGNOSIS — Z95810 Presence of automatic (implantable) cardiac defibrillator: Secondary | ICD-10-CM | POA: Diagnosis not present

## 2022-02-19 DIAGNOSIS — I959 Hypotension, unspecified: Secondary | ICD-10-CM | POA: Diagnosis not present

## 2022-02-19 DIAGNOSIS — E1169 Type 2 diabetes mellitus with other specified complication: Secondary | ICD-10-CM | POA: Diagnosis not present

## 2022-02-19 DIAGNOSIS — M6281 Muscle weakness (generalized): Secondary | ICD-10-CM | POA: Diagnosis not present

## 2022-02-19 DIAGNOSIS — Z7984 Long term (current) use of oral hypoglycemic drugs: Secondary | ICD-10-CM | POA: Diagnosis not present

## 2022-02-19 DIAGNOSIS — N184 Chronic kidney disease, stage 4 (severe): Secondary | ICD-10-CM | POA: Diagnosis not present

## 2022-02-19 DIAGNOSIS — E785 Hyperlipidemia, unspecified: Secondary | ICD-10-CM | POA: Diagnosis not present

## 2022-02-19 DIAGNOSIS — N3281 Overactive bladder: Secondary | ICD-10-CM | POA: Diagnosis not present

## 2022-02-19 DIAGNOSIS — E1142 Type 2 diabetes mellitus with diabetic polyneuropathy: Secondary | ICD-10-CM | POA: Diagnosis not present

## 2022-02-19 DIAGNOSIS — Z794 Long term (current) use of insulin: Secondary | ICD-10-CM | POA: Diagnosis not present

## 2022-02-19 DIAGNOSIS — I083 Combined rheumatic disorders of mitral, aortic and tricuspid valves: Secondary | ICD-10-CM | POA: Diagnosis not present

## 2022-02-19 DIAGNOSIS — I110 Hypertensive heart disease with heart failure: Secondary | ICD-10-CM | POA: Diagnosis not present

## 2022-02-19 DIAGNOSIS — E1122 Type 2 diabetes mellitus with diabetic chronic kidney disease: Secondary | ICD-10-CM | POA: Diagnosis not present

## 2022-02-19 DIAGNOSIS — I429 Cardiomyopathy, unspecified: Secondary | ICD-10-CM | POA: Diagnosis not present

## 2022-02-19 DIAGNOSIS — M8580 Other specified disorders of bone density and structure, unspecified site: Secondary | ICD-10-CM | POA: Diagnosis not present

## 2022-02-19 DIAGNOSIS — G8929 Other chronic pain: Secondary | ICD-10-CM | POA: Diagnosis not present

## 2022-02-19 DIAGNOSIS — I5022 Chronic systolic (congestive) heart failure: Secondary | ICD-10-CM | POA: Diagnosis not present

## 2022-02-19 DIAGNOSIS — N3946 Mixed incontinence: Secondary | ICD-10-CM | POA: Diagnosis not present

## 2022-02-19 DIAGNOSIS — N318 Other neuromuscular dysfunction of bladder: Secondary | ICD-10-CM | POA: Diagnosis not present

## 2022-02-19 DIAGNOSIS — M549 Dorsalgia, unspecified: Secondary | ICD-10-CM | POA: Diagnosis not present

## 2022-02-19 DIAGNOSIS — D631 Anemia in chronic kidney disease: Secondary | ICD-10-CM | POA: Diagnosis not present

## 2022-02-19 DIAGNOSIS — Z9181 History of falling: Secondary | ICD-10-CM | POA: Diagnosis not present

## 2022-02-19 DIAGNOSIS — E875 Hyperkalemia: Secondary | ICD-10-CM | POA: Diagnosis not present

## 2022-02-19 DIAGNOSIS — M48061 Spinal stenosis, lumbar region without neurogenic claudication: Secondary | ICD-10-CM | POA: Diagnosis not present

## 2022-02-22 ENCOUNTER — Telehealth: Payer: Self-pay

## 2022-02-22 DIAGNOSIS — Z95810 Presence of automatic (implantable) cardiac defibrillator: Secondary | ICD-10-CM | POA: Diagnosis not present

## 2022-02-22 NOTE — Chronic Care Management (AMB) (Signed)
    Chronic Care Management Pharmacy Assistant   Name: Morgan Hubbard  MRN: [id number] DOB: 04/10/1935  Reason for Encounter: Reminder Call   Medications: Outpatient Encounter Medications as of 02/22/2022  Medication Sig   amLODipine (NORVASC) 2.5 MG tablet Take 2.5 mg by mouth at bedtime.   aspirin EC 81 MG tablet Take 1 tablet (81 mg total) by mouth daily. Swallow whole.   atorvastatin (LIPITOR) 80 MG tablet Take 1 tablet (80 mg total) by mouth daily.   carvedilol (COREG) 12.5 MG tablet Take 1 tablet (12.5 mg total) by mouth in the morning, at noon, and at bedtime. (Patient taking differently: Take 12.5 mg by mouth. 2 in AM, 1 in PM)   cetirizine (ZYRTEC) 10 MG tablet Take 10 mg by mouth daily.   clotrimazole-betamethasone (LOTRISONE) cream Apply 1 application topically as needed (groin).    dapagliflozin propanediol (FARXIGA) 5 MG TABS tablet Take 5 mg by mouth daily.   famotidine (PEPCID) 20 MG tablet Take 1 tablet by mouth twice daily   gabapentin (NEURONTIN) 300 MG capsule Take 1 capsule (300 mg total) by mouth 3 (three) times daily.   glimepiride (AMARYL) 1 MG tablet Take 1-2 tablets (1-2 mg total) by mouth daily with breakfast.   hydrALAZINE (APRESOLINE) 50 MG tablet Take 1 tablet (50 mg total) by mouth 3 (three) times daily.   insulin degludec (TRESIBA) 100 UNIT/ML FlexTouch Pen Inject 16 Units into the skin at bedtime.   isosorbide mononitrate (IMDUR) 60 MG 24 hr tablet Take 1 tablet (60 mg total) by mouth in the morning and at bedtime.   letrozole (FEMARA) 2.5 MG tablet Take 2.5 mg by mouth daily.   nystatin cream (MYCOSTATIN) Apply 1 application topically 2 (two) times daily as needed (irritation.).    pantoprazole (PROTONIX) 40 MG tablet Take 1 tablet by mouth once daily   traMADol (ULTRAM) 50 MG tablet TAKE 1 TO 2 TABLETS BY MOUTH EVERY 8 HOURS AS NEEDED FOR MODERATE TO SEVERE PAIN   TRULICITY 1.5 MB/8.4YK SOPN Inject 1.5 mg into the skin once a week.   [name] ([name]) 75 MG  TABS Take 75 mg by mouth daily.   vitamin B-12 (CYANOCOBALAMIN) 1000 MCG tablet Take 1,000 mcg by mouth daily.    Vitamin D, Ergocalciferol, (DRISDOL) 1.25 MG (50000 UT) CAPS capsule Take 50,000 Units by mouth every 7 (seven) days.   No facility-administered encounter medications on file as of 02/22/2022.  Morgan Hubbard was contacted to remind of upcoming telephone visit with [name] on 02/27/22 at 11:45. Patient was reminded to have any blood glucose and blood pressure readings available for review at appointment.   Patient confirmed appointment.  Are you having any problems with your medications? No   Do you have any concerns you like to discuss with the pharmacist? No  CCM referral has been placed prior to visit?  Yes   Star Rating Drugs: Medication:  Last Fill: Day Supply Atorvastatin '80mg'$  12/25/21 90 Farxiga '5mg'$   01/03/22  90 Glimepiride '1mg'$  01/03/22  90 Tresiba  5/99/35 701 Trulicity  7/79/39 28 Tradjenta '5mg'$   01/25/22 30 Losartan/Hctz 100-12.'5mg'$   12/23/21 90 Metformin '1000mg'$  12/23/21 [place], CPP notified  [name], [place]  [phone]

## 2022-02-23 DIAGNOSIS — Z95810 Presence of automatic (implantable) cardiac defibrillator: Secondary | ICD-10-CM | POA: Diagnosis not present

## 2022-02-25 DIAGNOSIS — I5022 Chronic systolic (congestive) heart failure: Secondary | ICD-10-CM | POA: Diagnosis not present

## 2022-02-25 DIAGNOSIS — Z794 Long term (current) use of insulin: Secondary | ICD-10-CM | POA: Diagnosis not present

## 2022-02-25 DIAGNOSIS — E875 Hyperkalemia: Secondary | ICD-10-CM | POA: Diagnosis not present

## 2022-02-25 DIAGNOSIS — M48061 Spinal stenosis, lumbar region without neurogenic claudication: Secondary | ICD-10-CM | POA: Diagnosis not present

## 2022-02-25 DIAGNOSIS — N184 Chronic kidney disease, stage 4 (severe): Secondary | ICD-10-CM | POA: Diagnosis not present

## 2022-02-25 DIAGNOSIS — E1122 Type 2 diabetes mellitus with diabetic chronic kidney disease: Secondary | ICD-10-CM | POA: Diagnosis not present

## 2022-02-25 DIAGNOSIS — Z9181 History of falling: Secondary | ICD-10-CM | POA: Diagnosis not present

## 2022-02-25 DIAGNOSIS — N318 Other neuromuscular dysfunction of bladder: Secondary | ICD-10-CM | POA: Diagnosis not present

## 2022-02-25 DIAGNOSIS — Z7984 Long term (current) use of oral hypoglycemic drugs: Secondary | ICD-10-CM | POA: Diagnosis not present

## 2022-02-25 DIAGNOSIS — M549 Dorsalgia, unspecified: Secondary | ICD-10-CM | POA: Diagnosis not present

## 2022-02-25 DIAGNOSIS — I429 Cardiomyopathy, unspecified: Secondary | ICD-10-CM | POA: Diagnosis not present

## 2022-02-25 DIAGNOSIS — E1169 Type 2 diabetes mellitus with other specified complication: Secondary | ICD-10-CM | POA: Diagnosis not present

## 2022-02-25 DIAGNOSIS — I110 Hypertensive heart disease with heart failure: Secondary | ICD-10-CM | POA: Diagnosis not present

## 2022-02-25 DIAGNOSIS — N3946 Mixed incontinence: Secondary | ICD-10-CM | POA: Diagnosis not present

## 2022-02-25 DIAGNOSIS — I083 Combined rheumatic disorders of mitral, aortic and tricuspid valves: Secondary | ICD-10-CM | POA: Diagnosis not present

## 2022-02-25 DIAGNOSIS — D631 Anemia in chronic kidney disease: Secondary | ICD-10-CM | POA: Diagnosis not present

## 2022-02-25 DIAGNOSIS — I959 Hypotension, unspecified: Secondary | ICD-10-CM | POA: Diagnosis not present

## 2022-02-25 DIAGNOSIS — M6281 Muscle weakness (generalized): Secondary | ICD-10-CM | POA: Diagnosis not present

## 2022-02-25 DIAGNOSIS — M8580 Other specified disorders of bone density and structure, unspecified site: Secondary | ICD-10-CM | POA: Diagnosis not present

## 2022-02-25 DIAGNOSIS — Z95810 Presence of automatic (implantable) cardiac defibrillator: Secondary | ICD-10-CM | POA: Diagnosis not present

## 2022-02-25 DIAGNOSIS — E785 Hyperlipidemia, unspecified: Secondary | ICD-10-CM | POA: Diagnosis not present

## 2022-02-25 DIAGNOSIS — G8929 Other chronic pain: Secondary | ICD-10-CM | POA: Diagnosis not present

## 2022-02-25 DIAGNOSIS — E1142 Type 2 diabetes mellitus with diabetic polyneuropathy: Secondary | ICD-10-CM | POA: Diagnosis not present

## 2022-02-25 DIAGNOSIS — N3281 Overactive bladder: Secondary | ICD-10-CM | POA: Diagnosis not present

## 2022-02-27 ENCOUNTER — Ambulatory Visit (INDEPENDENT_AMBULATORY_CARE_PROVIDER_SITE_OTHER): Payer: Medicare HMO | Admitting: Pharmacist

## 2022-02-27 DIAGNOSIS — I10 Essential (primary) hypertension: Secondary | ICD-10-CM

## 2022-02-27 DIAGNOSIS — I5022 Chronic systolic (congestive) heart failure: Secondary | ICD-10-CM

## 2022-02-27 DIAGNOSIS — I428 Other cardiomyopathies: Secondary | ICD-10-CM

## 2022-02-27 DIAGNOSIS — N184 Chronic kidney disease, stage 4 (severe): Secondary | ICD-10-CM

## 2022-02-27 DIAGNOSIS — N318 Other neuromuscular dysfunction of bladder: Secondary | ICD-10-CM

## 2022-02-27 DIAGNOSIS — E1129 Type 2 diabetes mellitus with other diabetic kidney complication: Secondary | ICD-10-CM

## 2022-02-27 DIAGNOSIS — E1169 Type 2 diabetes mellitus with other specified complication: Secondary | ICD-10-CM

## 2022-02-27 NOTE — Progress Notes (Signed)
Chronic Care Management Pharmacy Note  02/27/2022 Name:  Morgan Hubbard MRN:  [id number] DOB:  Nov 20, 1934  Summary: CCM F/U visit -Pt reports BP generally < 140/90, improved with switch from [name] to [place]. OAB control is similar. -DM: A1c 9.8% (11/2021), she follows with endocrine and recently switched Tradjenta to Trulicity. She has had some low (<80) sugars since then, she may benefit from CGM  Recommendations/Changes made from today's visit: -[place] 3 through [place]; pt to contact PharmD when she receives device to schedule CGM training appt  Plan: -[place] will call patient within 2 weeks to schedule CGM appt -Pharmacist follow up visit  TBD (within 3 weeks for CGM)    Subjective: Morgan Hubbard is an 87 y.o. year old female who is a primary patient of Tower, [name], MD.  The CCM team was consulted for assistance with disease management and care coordination needs.    Engaged with patient by telephone for follow up visit in response to provider referral for pharmacy case management and/or care coordination services.   Consent to Services:  The patient was given information about Chronic Care Management services, agreed to services, and gave verbal consent prior to initiation of services.  Please see initial visit note for detailed documentation.   Patient Care Team: Tower, [name], MD as PCP - General (Family Medicine) Carolan Clines, MD (Inactive) as Attending Physician (Urology) Minna Merritts, MD as Consulting Physician (Cardiology) Daryll Brod, MD as Referring Physician (Cardiology) Oneta Rack, MD as Consulting Physician (Dermatology) Ward, Jeani Hawking, MD as Referring Physician (Internal Medicine) Wynona Canes, MD as Referring Physician (Neurology) Kimmick, Linus Mako, MD as Referring Physician (Oncology) Gabriel Carina Betsey Holiday, MD as Physician Assistant (Internal Medicine) [name], MD as Consulting Physician  (Family Medicine) Alvester Chou., DDS as Consulting Physician (Dentistry) Birder Robson, MD as Referring Physician (Ophthalmology) [name], [facility] as Pharmacist (Pharmacist)  Recent office visits: 01/04/22-[name],MD(PCP)- right ear fullness(New-farxiga- very $$ but does not hurt the kidney )Gfr of 27.85-K is back in nl range -impaction removed with simple ear irrigation and tolerated well   12/17/21-[name],MD(PCP)-f/u AKI,Labs,May need insulin in the future ,start PT,increase fluid intake.(Potassium is back into the normal range,Kidney numbers are stable to slightly improved ) 12/07/21-Graham Duncan,MD(fam med)-hospital f/u Hold metformin for now.  Continue linagliptin.  I would try restarting glimepiride 88m with breakfast.  If sugar is still above 200 after 1 week, then increase to 2 mg with breakfast. Would stay off losartan and hydrochlorothiazide for now.Labs ordered  11/28/21-[name],MD(PCP)-f/u hospital visit,Renal labs today (Worse kidney function - was sent to ER today with malaise and bp issues and dehydration ) If improved will start back losartan hctz - [facility] 11/05/21-[name],MD(PCP)-GI upset,labs(Potassium is mildly high,stable kidney numbers ,Liver tests are stable but protein levels are low Other labs are ok -Adding pepcid 20 mg twice daily  09/19/21-Spencer Copland,MD(fam med)- acute left shoulder pain,rest 2 weeks, no medication changes 07/16/21-Spencer Copland,MD(fam med)-right shoulder pain,xrays,injection with kenalog and lidocaine.  Recent consult visits: 02/12/22 Dr SGabriel Carina(Endocrine): Stop Tradjenta. Start Trulicity 1.5 mg. 80/7/12-RFXJSolum,MD(endo)- increase Tresiba to 16 units at bedtime, reduce glimepiride to 134mtake 2 tablets in am, restart farxiga 66m68make 1 tablet morning.She has been working with primary care provider and Cardiology on adjusting her BP medications. No longer taking losartan. F/u 4-6 weeks  12/25/21-Timothy  Gollan,MD(cardio)-f/u heart disease,EKG, imaging reviewed,Recommend she increase Hydrlazine 50 TID,Recommend she increase Coreg 12.5  TID,monitor BP daily,f/u 6 months 12/17/21-Anna Solum,MD(endo)-Telephone call to report BG's running high 11/06/21-Anna Solum,MD-(endo)-f/u DM,Stop glimepiride, replace with Tradjenta 5 mg daliy. F/u 3 months w labs 08/01/21-Cary Ward,MD(cardio)-f/u, start some PT,ride stationary bike,no medication changes, f/u 6 months 07/27/21-Anna Solum,MD(endo)-f/u DM,Add Trulicity 4.81 mg weekly.f/u 3 months   Hospital visits: 11/29/21 - 11/30/21 Admission [facility]): hypotension. Given IVF. Resume carvedilol, hold losartan/hctz, hold hydralazine, imdur  11/19/21 - 11/24/21 Admission ([facility]): Failure to thrive, AKI. Increased coreg to 25 mg BID. D/C losartan/hctz, d/c plavix (no need for DAPT). Held metformin. Removed Farxiga (cost)   Objective:  Lab Results  Component Value Date   CREATININE 1.67 (H) 01/01/2022   BUN 30 (H) 01/01/2022   GFR 27.85 (L) 01/01/2022   GFRNONAA 28 (L) 11/30/2021   GFRAA 41 (L) 06/15/2018   NA 140 01/01/2022   K 5.1 01/01/2022   CALCIUM 8.8 01/01/2022   CO2 31 01/01/2022   GLUCOSE 170 (H) 01/01/2022    Lab Results  Component Value Date/Time   HGBA1C 8.0 (H) 11/29/2021 04:16 PM   HGBA1C endo 01/03/2019 12:00 AM   HGBA1C 7.7 (H) 05/02/2017 09:40 AM   GFR 27.85 (L) 01/01/2022 10:29 AM   GFR 34.72 (L) 12/17/2021 12:44 PM   MICROALBUR 78.0 (H) 04/22/2016 02:55 PM    Last diabetic Eye exam:  Lab Results  Component Value Date/Time   HMDIABEYEEXA No Retinopathy 03/31/2020 12:00 AM    Last diabetic Foot exam: No results found for: "HMDIABFOOTEX"   Lab Results  Component Value Date   CHOL 99 06/12/2020   HDL 33.20 (L) 06/12/2020   LDLCALC 46 06/10/2019   LDLDIRECT 46.0 06/12/2020   TRIG 209.0 (H) 06/12/2020   CHOLHDL 3 06/12/2020       Latest Ref Rng & Units 11/29/2021   12:53 PM 11/28/2021    3:37 PM 11/05/2021   11:17 AM  Hepatic Function   Total Protein 6.5 - 8.1 g/dL 5.3  5.4  5.5   Albumin 3.5 - 5.0 g/dL 2.8  3.4  3.3   AST 15 - 41 U/L _0 ALT 0 - 44 U/L _1 Alk Phosphatase 38 - 126 U/L 69  66  55   Total Bilirubin 0.3 - 1.2 mg/dL 1.0  0.4  0.6   Bilirubin, Direct 0.0 - 0.2 mg/dL 0.1   0.1     Lab Results  Component Value Date/Time   TSH 2.34 11/28/2021 03:37 PM   TSH 2.62 06/16/2020 11:18 AM   FREET4 1.46 06/16/2020 11:18 AM       Latest Ref Rng & Units 12/07/2021    1:37 PM 11/30/2021    2:22 AM 11/29/2021   12:53 PM  CBC  WBC 3.8 - 10.8 Thousand/uL 6.4  6.2  7.4   Hemoglobin 11.7 - 15.5 g/dL 11.0  10.4  10.6   Hematocrit 35.0 - 45.0 % 34.4  31.6  32.5   Platelets 140 - 400 Thousand/uL 179  177  185     No results found for: "VD25OH"  Clinical ASCVD: Yes  The ASCVD Risk score (Arnett DK, et al., 2019) failed to calculate for the following reasons:   The 2019 ASCVD risk score is only valid for ages 79 to 36   The patient has a prior MI or stroke diagnosis       11/28/2021    3:03 PM 11/05/2021   10:32 AM 05/30/2021    1:21 PM  Depression screen  PHQ 2/9  Decreased Interest 0 0 0  Down, Depressed, Hopeless 0 0 0  PHQ - 2 Score 0 0 0     Social History   Tobacco Use  Smoking Status Never  Smokeless Tobacco Never   BP Readings from Last 3 Encounters:  01/04/22 (!) 144/58  12/25/21 (!) 166/70  12/17/21 (!) 142/80   Pulse Readings from Last 3 Encounters:  01/04/22 69  12/25/21 63  12/17/21 71   Wt Readings from Last 3 Encounters:  01/04/22 141 lb 3.2 oz (64 kg)  12/25/21 142 lb 6 oz (64.6 kg)  12/17/21 140 lb 12.8 oz (63.9 kg)   BMI Readings from Last 3 Encounters:  01/04/22 26.68 kg/m  12/25/21 26.90 kg/m  12/17/21 26.60 kg/m    Assessment/Interventions: Review of patient past medical history, allergies, medications, health status, including review of consultants reports, laboratory and other test data, was performed as part of comprehensive evaluation and provision  of chronic care management services.   SDOH:  (Social Determinants of Health) assessments and interventions performed: No - done 05/2021 SDOH Interventions    Flowsheet Row Chronic Care Management from 12/22/2020 in [place] at [place] from 06/10/2019 in [place] at [place] Interventions    Depression Interventions/Treatment  -- IOM3-5 Score <4 Follow-up Not Indicated  Financial Strain Interventions Intervention Not Indicated --      [place]: No Food Insecurity (05/30/2021)  Housing: Low Risk  (05/30/2021)  Transportation Needs: No Transportation Needs (05/30/2021)  Alcohol Screen: Low Risk  (05/30/2021)  Depression (PHQ2-9): Low Risk  (11/28/2021)  Financial Resource Strain: Low Risk  (05/30/2021)  Physical Activity: Inactive (05/30/2021)  Social Connections: Moderately Isolated (05/30/2021)  Stress: No Stress Concern Present (05/30/2021)  Tobacco Use: Low Risk  (01/04/2022)    CCM Care Plan  Allergies  Allergen Reactions   Ace Inhibitors Swelling    REACTION: tongue swelling   Lisinopril Swelling    Swelling of tongue   Insulin Detemir Itching   Nsaids Other (See Comments)    Renal issues   Codeine Nausea Only   Hydrochlorothiazide Other (See Comments)    Dizziness/funny feeling   Tylenol [Acetaminophen] Other (See Comments)    Fatty liver - use with caution    Medications Reviewed Today     Reviewed by [name], [facility] (Pharmacist) on 02/27/22 at 1228  Med List Status: <None>   Medication Order Taking? Sig Documenting Provider Last Dose Status Informant  amLODipine (NORVASC) 2.5 MG tablet [id number] Yes Take 2.5 mg by mouth at bedtime. Justin Mend, MD Taking Active   aspirin EC 81 MG tablet [id number] Yes Take 1 tablet (81 mg total) by mouth daily. Swallow whole. Minna Merritts, MD Taking Active Multiple Informants  atorvastatin (LIPITOR) 80 MG tablet [id number] Yes Take 1  tablet (80 mg total) by mouth daily. Minna Merritts, MD Taking Active   carvedilol (COREG) 12.5 MG tablet [id number] Yes Take 1 tablet (12.5 mg total) by mouth in the morning, at noon, and at bedtime.  Patient taking differently: Take 12.5 mg by mouth. 2 in AM, 1 in PM   Muniz, Kathlene November, MD Taking Active   cetirizine (ZYRTEC) 10 MG tablet [id number] Yes Take 10 mg by mouth daily. [provider] Taking Active Multiple Informants  clotrimazole-betamethasone (LOTRISONE) cream [id number] Yes Apply 1 application topically as needed (groin).  [provider] Taking Active Multiple Informants  Med Note Ebony Hail, AMBER B   Wed Jun 10, 2018  5:40 PM)    dapagliflozin propanediol (FARXIGA) 5 MG TABS tablet [id number] Yes Take 5 mg by mouth daily. [provider] Taking Active   famotidine (PEPCID) 20 MG tablet [id number] Yes Take 1 tablet by mouth twice daily Tower, [name], MD Taking Active   gabapentin (NEURONTIN) 300 MG capsule [id number] Yes Take 1 capsule (300 mg total) by mouth 3 (three) times daily. Tower, [name], MD Taking Active Multiple Informants  glimepiride (AMARYL) 1 MG tablet [id number] Yes Take 1-2 tablets (1-2 mg total) by mouth daily with breakfast. Tonia Ghent, MD Taking Active   hydrALAZINE (APRESOLINE) 50 MG tablet [id number] Yes Take 1 tablet (50 mg total) by mouth 3 (three) times daily. Minna Merritts, MD Taking Active   insulin degludec (TRESIBA) 100 UNIT/ML FlexTouch Pen [id number] Yes Inject 16 Units into the skin at bedtime. [provider] Taking Active   isosorbide mononitrate (IMDUR) 60 MG 24 hr tablet [id number] Yes Take 1 tablet (60 mg total) by mouth in the morning and at bedtime. Minna Merritts, MD Taking Active   letrozole [facility]) 2.5 MG tablet [id number] Yes Take 2.5 mg by mouth daily. [provider] Taking Active Multiple Informants  nystatin cream (MYCOSTATIN) [id number] Yes Apply 1 application topically 2 (two)  times daily as needed (irritation.).  [provider] Taking Active Multiple Informants  pantoprazole (PROTONIX) 40 MG tablet [id number] Yes Take 1 tablet by mouth once daily Tower, [name], MD Taking Active Multiple Informants  traMADol (ULTRAM) 50 MG tablet [id number] Yes TAKE 1 TO 2 TABLETS BY MOUTH EVERY 8 HOURS AS NEEDED FOR MODERATE TO SEVERE PAIN Tower, [name], MD Taking Active   TRULICITY 1.5 FG/1.8EX SOPN [id number] Yes Inject 1.5 mg into the skin once a week. Judi Cong, MD Taking Active   [name] [facility]) 75 MG TABS [id number] Yes Take 75 mg by mouth daily. Tower, [name], MD Taking Active   vitamin B-12 (CYANOCOBALAMIN) 1000 MCG tablet [id number] Yes Take 1,000 mcg by mouth daily.  [provider] Taking Active Multiple Informants  Vitamin D, Ergocalciferol, (DRISDOL) 1.25 MG (50000 UT) CAPS capsule [id number] Yes Take 50,000 Units by mouth every 7 (seven) days. [provider] Taking Active Multiple Informants           Med Note Geradine Girt Dec 07, 2021 12:51 PM)              Patient Active Problem List   Diagnosis Date Noted   Near syncope 11/29/2021   Hypotension 11/29/2021   Generalized weakness 11/28/2021   Encounter for medication review 11/28/2021   Nausea 11/05/2021   Shoulder pain 04/25/2021   History of breast cancer 06/18/2020   Anemia 06/16/2020   Elevated TSH 06/16/2020   Occipital neuralgia 02/14/2020   Hair loss 11/09/2018   Leg weakness, bilateral 06/29/2018   Pedal edema 06/22/2018   AICD (automatic cardioverter/defibrillator) present 06/11/2018   GERD (gastroesophageal reflux disease) 06/10/2018   AKI (acute kidney injury) ([place]) 06/10/2018   Cholecystitis 06/10/2018   Tingling 10/06/2017   Chronic back pain 44/31/5400   Chronic systolic (congestive) heart failure ([place]) 02/25/2017   Osteopenia 06/16/2016   Routine general medical examination at a health care facility 04/24/2016   Estrogen deficiency 04/24/2016    Urticaria 11/03/2015   Cerumen impaction 03/10/2015   Encounter for Medicare annual wellness exam 06/13/2014   Stroke, small vessel ([place]) 01/18/2014  Family history of hemochromatosis 01/18/2014   Cerebral thrombosis with cerebral infarction ([place]) 01/14/2014   Expressive aphasia 01/13/2014   Nonischemic cardiomyopathy ([place]) 04/05/2013   Shortness of breath 09/03/2012   Elevated transaminase level 05/18/2012   Spinal stenosis of lumbar region 12/10/2011   Mixed incontinence urge and stress 12/10/2011   Renal insufficiency 11/04/2011   Goiter 01/14/2011   Fatty liver 08/28/2010   Type II diabetes mellitus with renal manifestations ([place]) 07/09/2010   Hyperlipidemia associated with type 2 diabetes mellitus ([place]) 07/09/2010   Hyperkalemia 07/09/2010   REFLEX SYMPATHETIC DYSTROPHY 07/09/2010   Neuropathy 07/09/2010   Essential hypertension 07/09/2010   CARDIOMYOPATHY 07/09/2010   OVERACTIVE BLADDER 07/09/2010    Immunization History  Administered Date(s) Administered   Fluad Quad(high Dose 65+) 02/14/2020, 02/15/2021   Influenza,inj,Quad PF,6+ Mos 05/24/2015, 04/03/2016, 03/09/2018   Influenza-Unspecified 03/29/2013, 04/11/2014, 04/03/2016, 03/17/2017   PFIZER(Purple Top)SARS-COV-2 Vaccination 07/16/2019, 08/06/2019, 03/03/2020   Pneumococcal Conjugate-13 06/13/2014   Pneumococcal Polysaccharide-23 01/14/2011    Conditions to be addressed/monitored:  Hypertension, Hyperlipidemia, Diabetes, Heart Failure, Coronary Artery Disease, and Chronic Kidney Disease  Care Plan : [place]  Updates made by [name], [place] since 03/01/2022 12:00 AM     Problem: Hypertension, Hyperlipidemia, Diabetes, Heart Failure, Coronary Artery Disease, and Chronic Kidney Disease   Priority: High     Long-Range Goal: Disease Management   Start Date: 01/24/2022  Expected End Date: 02/01/2023  This Visit's Progress: On track  Recent Progress: On track  Priority: High  Note:    Current Barriers:  Unable to maintain control of DM  Pharmacist Clinical Goal(s):  Patient will achieve improvement in DM as evidenced by A1c < 8% through collaboration with PharmD and provider.   Interventions: 1:1 collaboration with Tower, [name], MD regarding development and update of comprehensive plan of care as evidenced by provider attestation and co-signature Inter-disciplinary care team collaboration (see longitudinal plan of care) Comprehensive medication review performed; medication list updated in electronic medical record  Hypertension / Heart Failure (BP goal <140/90) -Improved - BP generally < 140/90 at home, improved with switch from [name] to [place] -Current home readings: SBP 139 this week -Denies hypotensive symptoms, but does report headache when BP is elevated. -Last ejection fraction: 45-50% (Date: 11/30/21) - improved from 15-20% (2015) -HF type: Preserved; NYHA Class: II-III -Current treatment: Carvedilol 12.5 mg - 2 AM, 1 PM - Appropriate, Query Effective Hydralazine 50 mg TID -Appropriate, Query Effective Isosorbide MN 60 mg BID -Appropriate, Query Effective Amlodipine 2.5 mg daily HS -Appropriate, Query Effective Farxiga 5 mg daily (HW) -Appropriate, Query Effective -Medications previously tried: furosemide, HCTZ, losartan, lisinopril (tongue swelling) -Educated on BP goals and benefits of medications for prevention of heart attack, stroke and kidney damage; -Counseled to monitor BP at home daily for 1-2 weeks -Reviewed finances - pt enrolled in [employer] for Farxiga (Cardiomyopathy) -Recommended to continue current medication; updated med list  Hyperlipidemia: (LDL goal < 70) -Controlled - LDL 46 (06/2020) at goal, due for repeat lipid panel -Hx stroke, NICM -Current treatment: Atorvastatin 80 mg daily - Appropriate, Effective, Safe, Accessible Aspirin 81 mg daily - Appropriate, Effective, Safe, Accessible -Medications previously tried:  pravastatin  -Educated on Cholesterol goals;  -Recommended to continue current medication; repeat lipid panel at next OV  Diabetes (A1c goal <8%) -Uncontrolled - A1c 9.8% (11/2021) worsening; she has not been taking metformin since July 5th due to GFR < 30; she was recently switched from [place] to Trulicity and insulin was  increased to 16 units. She has had some low sugars since starting Trulicity, discussed we may need to reduce insulin dose -Current home glucose readings: 136  3am: 73 (sweating) -Current medications: Farxiga 5 mg daily (HW)- Appropriate, Query Effective (for DM) Glimepiride 1 mg (1-2 tab) daily AM (started ~2012)- Appropriate, Effective, Query Safe Tresiba 16 units HS -Appropriate, Effective, Query Safe Trulicity 1.5 mg weekly - Appropriate, Query Effective -Medications previously tried: Geneticist, molecular (cost), Tradjenta (started Trulicity), metformin (GFR < 30) -Reviewed [name] is likely not very effective for DM control with GFR <30, though it should still remain on board for help with HF and CKD -Discussed benefits of CGM, pt would like to try [employer] 3. Ordered FL3 to [place] via [place] -Recommended to continue current medication  Chronic Kidney Disease Stage 4  -All medications assessed for renal dosing and appropriateness in chronic kidney disease. -Pt is on Farxiga. Pt  is not on ACE/ARB due to hx of tongue swelling with lisinopril. -Recommended to continue current medication  GERD (Goal: minimize symptoms of reflux ) -Controlled - per pt report -Current treatment  Pantoprazole 40 mg daily - Appropriate, Effective, Safe, Accessible Famotidine 20 mg BID -Appropriate, Effective, Safe, Accessible -Medications previously tried: n/a -Hx of Bleeds/ulcers: No -Counseled on small meals, elevating head, and sleeping on left side -Recommended to continue current medication  OAB (Goal: minimize nocturia) -Not ideally controlled - pt has switched from [name]  to [place] due to high BP issues; she reports OAB control is about the same, still urinates frequently -she has appt with pelvic PT this week -Current treatment  [name] 70 mg daily - Appropriate, Effective, Safe, Accessible -Medications previously tried: Toviaz, oxybutynin, tolterodine  -Reviewed benefits of PT -Recommend to continue current medication  Neuropathy (Goal: manage symptoms) -Controlled -Current treatment  Gabapentin 300 mg TID - Appropriate, Effective, Safe, Accessible -Medications previously tried: n/a  -Recommended to continue current medication  Breast cancer (Goal: prevent recurrence) -Controlled  -Follows with oncology (Dr Rip Harbour, [facility]). Per oncology, plan for 7-year course of Letrozole, will be completed 2024 -Current treatment  Letrozole 2.5 mg daily - Appropriate, Effective, Safe, Accessible -Medications previously tried: n/a  -Recommended to continue current medication   Patient Goals/Self-Care Activities Patient will:  - take medications as prescribed as evidenced by patient report and record review focus on medication adherence by routine check glucose daily, document, and provide at future appointments check blood pressure daily, document, and provide at future appointments collaborate with provider on medication access solutions      Medication Assistance:  [place] (Cardiomyopathy) 12/25/21 - 12/25/22 ID: [id number] BIN: [id number] PCN: [id number] GROUP: [id number]  Compliance/Adherence/Medication fill history: Care Gaps: Mammogram (due 11/14/20) Eye exam (due 03/31/21) Foot exam (due 06/16/21)  Star-Rating Drugs: Atorvastatin - PDC 94% Farxiga - PDC 75% (LF 01/03/22 x 90 ds) Tradjenta - PDC 100% Glimepiride - PDC 100%  Medication Access: Within the past 30 days, how often has patient missed a dose of medication? 0 Is a pillbox or other method used to improve adherence? Yes  Factors that may affect medication adherence?  Confusion over doses prescribed Are meds synced by current pharmacy? No  Are meds delivered by current pharmacy? No  Does patient experience delays in picking up medications due to transportation concerns? No   [facility] Services Reviewed: Is patient disadvantaged to use [facility]?: No  Current Rx insurance plan: [employer] [place] Name and location of Current pharmacy:  [facility] [address] [place] [address] Phone: [phone] Fax: [phone]  [facility] services reviewed with patient today?: No  Patient requests to transfer care to [facility]?: No  Reason patient declined to change pharmacies: Not mentioned at this visit   Care Plan and Follow Up Patient Decision:  Patient agrees to Care Plan and Follow-up.  Plan: Face to Face appointment with care management team member scheduled for: TBD  [name], PharmD, BCACP Clinical Pharmacist [facility] at [facility] [phone]

## 2022-02-28 DIAGNOSIS — E1165 Type 2 diabetes mellitus with hyperglycemia: Secondary | ICD-10-CM | POA: Diagnosis not present

## 2022-02-28 DIAGNOSIS — Z794 Long term (current) use of insulin: Secondary | ICD-10-CM | POA: Diagnosis not present

## 2022-03-01 NOTE — Patient Instructions (Addendum)
Visit Information  Phone number for Pharmacist: [phone]   Goals Addressed   None     Care Plan : [place]  Updates made by [name], RPH since 03/01/2022 12:00 AM     Problem: Hypertension, Hyperlipidemia, Diabetes, Heart Failure, Coronary Artery Disease, and Chronic Kidney Disease   Priority: High     Long-Range Goal: Disease Management   Start Date: 01/24/2022  Expected End Date: 02/01/2023  This Visit's Progress: On track  Recent Progress: On track  Priority: High  Note:   Current Barriers:  Unable to maintain control of DM  Pharmacist Clinical Goal(s):  Patient will achieve improvement in DM as evidenced by A1c < 8% through collaboration with PharmD and provider.   Interventions: 1:1 collaboration with Tower, [name], MD regarding development and update of comprehensive plan of care as evidenced by provider attestation and co-signature Inter-disciplinary care team collaboration (see longitudinal plan of care) Comprehensive medication review performed; medication list updated in electronic medical record  Hypertension / Heart Failure (BP goal <140/90) -Improved - BP generally < 140/90 at home, improved with switch from [name] to [place] -Current home readings: SBP 139 this week -Denies hypotensive symptoms, but does report headache when BP is elevated. -Last ejection fraction: 45-50% (Date: 11/30/21) - improved from 15-20% (2015) -HF type: Preserved; NYHA Class: II-III -Current treatment: Carvedilol 12.5 mg - 2 AM, 1 PM - Appropriate, Query Effective Hydralazine 50 mg TID -Appropriate, Query Effective Isosorbide [place] 60 mg BID -Appropriate, Query Effective Amlodipine 2.5 mg daily HS -Appropriate, Query Effective Farxiga 5 mg daily (HW) -Appropriate, Query Effective -Medications previously tried: furosemide, HCTZ, losartan, lisinopril (tongue swelling) -Educated on BP goals and benefits of medications for prevention of heart attack, stroke  and kidney damage; -Counseled to monitor BP at home daily for 1-2 weeks -Reviewed finances - pt enrolled in [employer] for Farxiga (Cardiomyopathy) -Recommended to continue current medication; updated med list  Hyperlipidemia: (LDL goal < 70) -Controlled - LDL 46 (06/2020) at goal, due for repeat lipid panel -Hx stroke, NICM -Current treatment: Atorvastatin 80 mg daily - Appropriate, Effective, Safe, Accessible Aspirin 81 mg daily - Appropriate, Effective, Safe, Accessible -Medications previously tried: pravastatin  -Educated on Cholesterol goals;  -Recommended to continue current medication; repeat lipid panel at next OV  Diabetes (A1c goal <8%) -Uncontrolled - A1c 9.8% (11/2021) worsening; she has not been taking metformin since July 5th due to GFR < 30; she was recently switched from [place] to Trulicity and insulin was increased to 16 units. She has had some low sugars since starting Trulicity, discussed we may need to reduce insulin dose -Current home glucose readings: 136  3am: 73 (sweating) -Current medications: Farxiga 5 mg daily (HW)- Appropriate, Query Effective (for DM) Glimepiride 1 mg (1-2 tab) daily AM (started ~2012)- Appropriate, Effective, Query Safe Tresiba 16 units HS -Appropriate, Effective, Query Safe Trulicity 1.5 mg weekly - Appropriate, Query Effective -Medications previously tried: Geneticist, molecular (cost), Tradjenta (started Trulicity), metformin (GFR < 30) -Reviewed [name] is likely not very effective for DM control with GFR <30, though it should still remain on board for help with HF and CKD -Discussed benefits of CGM, pt would like to try [employer] 3. Ordered FL3 to [place] via [employer] -Recommended to continue current medication  Chronic Kidney Disease Stage 4  -All medications assessed for renal dosing and appropriateness in chronic kidney disease. -Pt is on Farxiga. Pt  is not on ACE/ARB due to hx of tongue swelling with lisinopril. -Recommended  to  continue current medication  GERD (Goal: minimize symptoms of reflux ) -Controlled - per pt report -Current treatment  Pantoprazole 40 mg daily - Appropriate, Effective, Safe, Accessible Famotidine 20 mg BID -Appropriate, Effective, Safe, Accessible -Medications previously tried: n/a -Hx of Bleeds/ulcers: No -Counseled on small meals, elevating head, and sleeping on left side -Recommended to continue current medication  OAB (Goal: minimize nocturia) -Not ideally controlled - pt has switched from [name] to [place] due to high BP issues; she reports OAB control is about the same, still urinates frequently -she has appt with pelvic PT this week -Current treatment  [name] 70 mg daily - Appropriate, Effective, Safe, Accessible -Medications previously tried: Toviaz, oxybutynin, tolterodine  -Reviewed benefits of PT -Recommend to continue current medication  Neuropathy (Goal: manage symptoms) -Controlled -Current treatment  Gabapentin 300 mg TID - Appropriate, Effective, Safe, Accessible -Medications previously tried: n/a  -Recommended to continue current medication  Breast cancer (Goal: prevent recurrence) -Controlled  -Follows with oncology (Dr Rip Harbour, [facility]). Per oncology, plan for 7-year course of Letrozole, will be completed 2024 -Current treatment  Letrozole 2.5 mg daily - Appropriate, Effective, Safe, Accessible -Medications previously tried: n/a  -Recommended to continue current medication   Patient Goals/Self-Care Activities Patient will:  - take medications as prescribed as evidenced by patient report and record review focus on medication adherence by routine check glucose daily, document, and provide at future appointments check blood pressure daily, document, and provide at future appointments collaborate with provider on medication access solutions      Patient verbalizes understanding of instructions and care plan provided today and agrees to view in [place].  Active MyChart status and patient understanding of how to access instructions and care plan via MyChart confirmed with patient.    Face to Face appointment with pharmacist scheduled for:  TBD(within 1 month for CGM)  [name], PharmD, BCACP Clinical Pharmacist [facility] at [facility] [phone]

## 2022-03-02 DIAGNOSIS — I5020 Unspecified systolic (congestive) heart failure: Secondary | ICD-10-CM | POA: Diagnosis not present

## 2022-03-02 DIAGNOSIS — E1122 Type 2 diabetes mellitus with diabetic chronic kidney disease: Secondary | ICD-10-CM | POA: Diagnosis not present

## 2022-03-02 DIAGNOSIS — I130 Hypertensive heart and chronic kidney disease with heart failure and stage 1 through stage 4 chronic kidney disease, or unspecified chronic kidney disease: Secondary | ICD-10-CM

## 2022-03-02 DIAGNOSIS — E1159 Type 2 diabetes mellitus with other circulatory complications: Secondary | ICD-10-CM | POA: Diagnosis not present

## 2022-03-02 DIAGNOSIS — E785 Hyperlipidemia, unspecified: Secondary | ICD-10-CM | POA: Diagnosis not present

## 2022-03-02 DIAGNOSIS — E1140 Type 2 diabetes mellitus with diabetic neuropathy, unspecified: Secondary | ICD-10-CM | POA: Diagnosis not present

## 2022-03-02 DIAGNOSIS — Z794 Long term (current) use of insulin: Secondary | ICD-10-CM

## 2022-03-02 DIAGNOSIS — N184 Chronic kidney disease, stage 4 (severe): Secondary | ICD-10-CM | POA: Diagnosis not present

## 2022-03-04 ENCOUNTER — Telehealth: Payer: Self-pay | Admitting: Family Medicine

## 2022-03-04 NOTE — Telephone Encounter (Signed)
Pt called requesting for Morgan Hubbard to give her a call. Pt stated lindsey told her she's teach her how to set up her Libre, glucose monitoring machine. Call back # is [phone]

## 2022-03-05 ENCOUNTER — Ambulatory Visit (INDEPENDENT_AMBULATORY_CARE_PROVIDER_SITE_OTHER): Payer: Medicare HMO | Admitting: Pharmacist

## 2022-03-05 DIAGNOSIS — E1129 Type 2 diabetes mellitus with other diabetic kidney complication: Secondary | ICD-10-CM

## 2022-03-05 DIAGNOSIS — E1169 Type 2 diabetes mellitus with other specified complication: Secondary | ICD-10-CM

## 2022-03-05 NOTE — Telephone Encounter (Signed)
Contacted the patient and she would like to come this afternoon, opening at 2:15pm  [name], CPP notified  [name], [place]  [phone]

## 2022-03-05 NOTE — Patient Instructions (Signed)
Today we placed the [employer] Libre continuous glucose monitor (CGM) on your arm. It will track your sugar for you with new readings every 1-5 minutes.  Important points to remember about your CGM: -Any time the sugar reading does not reflect how you feel (particularly if it is telling you a very low reading), double check with a finger stick -The sensor is measuring the sugar in your skin, and a finger stick measures the sugar in your blood, and these numbers will probably never be exactly the same. This is normal. -The system will prompt you to change the sensor after approximately 14 days. -The sensor has to be removed prior to CT scans or MRIs. Try not to apply a new sensor if you know you have a scan coming up. -If the sensor falls off early, it cannot be re-applied. A new sensor should be applied.  With any sensor malfunctions, call [employer]: [phone]  Visit Information  Phone number for Pharmacist: [phone]   Goals Addressed   None     Care Plan : [place]  Updates made by [name], RPH since 03/06/2022 12:00 AM     Problem: Hypertension, Hyperlipidemia, Diabetes, Heart Failure, Coronary Artery Disease, and Chronic Kidney Disease   Priority: High     Long-Range Goal: Disease Management   Start Date: 01/24/2022  Expected End Date: 02/01/2023  This Visit's Progress: On track  Recent Progress: On track  Priority: High  Note:   Current Barriers:  Unable to maintain control of DM  Pharmacist Clinical Goal(s):  Patient will achieve improvement in DM as evidenced by A1c < 8% through collaboration with PharmD and provider.   Interventions: 1:1 collaboration with Tower, [name], MD regarding development and update of comprehensive plan of care as evidenced by provider attestation and co-signature Inter-disciplinary care team collaboration (see longitudinal plan of care) Comprehensive medication review performed; medication list updated in  electronic medical record  Hypertension / Heart Failure (BP goal <140/90) -Improved - BP generally < 140/90 at home, improved with switch from [name] to [place] -Current home readings: SBP 139 this week -Denies hypotensive symptoms, but does report headache when BP is elevated. -Last ejection fraction: 45-50% (Date: 11/30/21) - improved from 15-20% (2015) -HF type: Preserved; NYHA Class: II-III -Current treatment: Carvedilol 12.5 mg - 2 AM, 1 PM - Appropriate, Query Effective Hydralazine 50 mg TID -Appropriate, Query Effective Isosorbide MN 60 mg BID -Appropriate, Query Effective Amlodipine 2.5 mg daily HS -Appropriate, Query Effective Farxiga 5 mg daily (HW) -Appropriate, Query Effective -Medications previously tried: furosemide, HCTZ, losartan, lisinopril (tongue swelling) -Educated on BP goals and benefits of medications for prevention of heart attack, stroke and kidney damage; -Counseled to monitor BP at home daily for 1-2 weeks -Reviewed finances - pt enrolled in [employer] for Farxiga (Cardiomyopathy) -Recommended to continue current medication; updated med list  Hyperlipidemia: (LDL goal < 70) -Controlled - LDL 46 (06/2020) at goal, due for repeat lipid panel -Hx stroke, NICM -Current treatment: Atorvastatin 80 mg daily - Appropriate, Effective, Safe, Accessible Aspirin 81 mg daily - Appropriate, Effective, Safe, Accessible -Medications previously tried: pravastatin  -Educated on Cholesterol goals;  -Recommended to continue current medication; repeat lipid panel at next OV  Diabetes (A1c goal <8%) -Uncontrolled - A1c 9.8% (11/2021) worsening; she has not been taking metformin since July 5th due to GFR < 30; she was recently switched from [place] to Trulicity and insulin was increased to 16 units. She has had some low sugars since  starting Trulicity, discussed we may need to reduce insulin dose -[employer] training completed today. Pt using reader because she forgot  her apple password and cannot download app today. -Current home glucose readings: 136  3am: 73 (sweating) -Current medications: Farxiga 5 mg daily (HW)- Appropriate, Query Effective (for DM) Glimepiride 1 mg (1-2 tab) daily AM (started ~2012)- Appropriate, Effective, Query Safe Tresiba 16 units HS -Appropriate, Effective, Query Safe Trulicity 1.5 mg weekly - Appropriate, Query Effective -Medications previously tried: Geneticist, molecular (cost), Tradjenta (started Trulicity), metformin (GFR < 30) -Reviewed [name] is likely not very effective for DM control with GFR <30, though it should still remain on board for help with HF and CKD -Discussed diet at length - advised to try to keep snacks to < 15 g of carbs, maximize protein/vegetables regularly and avoid large portions of carbs -Recommended to continue current medication  Chronic Kidney Disease Stage 4  -All medications assessed for renal dosing and appropriateness in chronic kidney disease. -Pt is on Farxiga. Pt  is not on ACE/ARB due to hx of tongue swelling with lisinopril. -Recommended to continue current medication  GERD (Goal: minimize symptoms of reflux ) -Controlled - per pt report -Current treatment  Pantoprazole 40 mg daily - Appropriate, Effective, Safe, Accessible Famotidine 20 mg BID -Appropriate, Effective, Safe, Accessible -Medications previously tried: n/a -Hx of Bleeds/ulcers: No -Counseled on small meals, elevating head, and sleeping on left side -Recommended to continue current medication  OAB (Goal: minimize nocturia) -Not ideally controlled - pt has switched from [name] to [place] due to high BP issues; she reports OAB control is about the same, still urinates frequently -she has appt with pelvic PT this week -Current treatment  [name] 70 mg daily - Appropriate, Effective, Safe, Accessible -Medications previously tried: Toviaz, oxybutynin, tolterodine  -Reviewed benefits of PT -Recommend to continue current  medication  Neuropathy (Goal: manage symptoms) -Controlled -Current treatment  Gabapentin 300 mg TID - Appropriate, Effective, Safe, Accessible -Medications previously tried: n/a  -Recommended to continue current medication  Breast cancer (Goal: prevent recurrence) -Controlled  -Follows with oncology (Dr Rip Harbour, [facility]). Per oncology, plan for 7-year course of Letrozole, will be completed 2024 -Current treatment  Letrozole 2.5 mg daily - Appropriate, Effective, Safe, Accessible -Medications previously tried: n/a  -Recommended to continue current medication  Patient Goals/Self-Care Activities Patient will:  - take medications as prescribed as evidenced by patient report and record review focus on medication adherence by routine check glucose daily, document, and provide at future appointments check blood pressure daily, document, and provide at future appointments collaborate with provider on medication access solutions      Patient verbalizes understanding of instructions and care plan provided today and agrees to view in [place]. Active MyChart status and patient understanding of how to access instructions and care plan via MyChart confirmed with patient.    Telephone follow up appointment with pharmacy team member scheduled for: 2 weeks  [name], PharmD, [facility] Clinical Pharmacist [facility] at [facility] [phone]

## 2022-03-05 NOTE — Progress Notes (Unsigned)
Chronic Care Management Pharmacy Note  02/27/2022 Name:  Morgan Hubbard MRN:  [id number] DOB:  21-Oct-1934  Summary: CCM F/U visit -Pt reports BP generally < 140/90, improved with switch from [name] to [place]. OAB control is similar. -DM: A1c 9.8% (11/2021), she follows with endocrine and recently switched Tradjenta to Trulicity. She has had some low (<80) sugars since then, she may benefit from CGM  Patient presented for [place] [facility] training.  Demonstrated with teach-back: -components of sensor and how to prepare sensor for application -how to apply sensor to arm. Patient successfully applied sensor to *** arm today -how to navigate {Libre reader:27363} in order to pair sensor; sensor was successfully paired today -how to adjust high/low sugar alarms. Low alarm set to *** and high alarm set to *** today. -App users: demonstrated navigating various components including settings, logbook, and daily patterns  Educated patient on: -scanning sensor at least every 8 hours to ensure no gaps in data -changing sensor every 14 days or as initiated by app/reader -avoiding high doses of Vitamin C > 500 mg/day -removing sensor prior to MRI, CT scans -if sugar readings ever seem wrong/questionable, check sugar with a finger stick -Libreview cloud monitoring: Patient {Desc; did/not:14019} enroll in [place]  Provided customer service line for [employer] in case of sensor breakdowns: [phone]   Pt voiced understanding of above and denies further questions.  Recommendations/Changes made from today's visit: -[place] [facility] through [place]; pt to contact PharmD when she receives device to schedule CGM training appt  Plan: -[facility] will call patient within 2 weeks to schedule CGM appt -Pharmacist follow up visit  TBD (within 3 weeks for CGM)    Subjective: Morgan Hubbard is an 88 y.o. year old female who is a primary patient of Tower, [name], MD.  The  CCM team was consulted for assistance with disease management and care coordination needs.    Engaged with patient by telephone for follow up visit in response to provider referral for pharmacy case management and/or care coordination services.   Consent to Services:  The patient was given information about Chronic Care Management services, agreed to services, and gave verbal consent prior to initiation of services.  Please see initial visit note for detailed documentation.   Patient Care Team: Tower, [name], MD as PCP - General (Family Medicine) Carolan Clines, MD (Inactive) as Attending Physician (Urology) Minna Merritts, MD as Consulting Physician (Cardiology) Daryll Brod, MD as Referring Physician (Cardiology) Oneta Rack, MD as Consulting Physician (Dermatology) Ward, Jeani Hawking, MD as Referring Physician (Internal Medicine) Wynona Canes, MD as Referring Physician (Neurology) Kimmick, Linus Mako, MD as Referring Physician (Oncology) Gabriel Carina Betsey Holiday, MD as Physician Assistant (Internal Medicine) [name], MD as Consulting Physician (Family Medicine) Alvester Chou., DDS as Consulting Physician (Dentistry) Birder Robson, MD as Referring Physician (Ophthalmology) [name], [facility] as Pharmacist (Pharmacist)  Recent office visits: 01/04/22-[name],MD(PCP)- right ear fullness(New-farxiga- very $$ but does not hurt the kidney )Gfr of 27.85-K is back in nl range -impaction removed with simple ear irrigation and tolerated well   12/17/21-[name],MD(PCP)-f/u AKI,Labs,May need insulin in the future ,start PT,increase fluid intake.(Potassium is back into the normal range,Kidney numbers are stable to slightly improved ) 12/07/21-Graham Duncan,MD(fam med)-hospital f/u Hold metformin for now.  Continue linagliptin.  I would try restarting glimepiride 85m with breakfast.  If sugar is still above 200 after 1 week, then increase to 2 mg with  breakfast. Would stay off losartan and hydrochlorothiazide for now.Labs ordered  11/28/21-[name],MD(PCP)-f/u hospital visit,Renal labs today (Worse kidney function - was sent to ER today with malaise and bp issues and dehydration ) If improved will start back losartan hctz - referral for [facility]. 11/05/21-[name],MD(PCP)-GI upset,labs(Potassium is mildly high,stable kidney numbers ,Liver tests are stable but protein levels are low Other labs are ok -Adding pepcid 20 mg twice daily  09/19/21-Spencer Copland,MD(fam med)- acute left shoulder pain,rest 2 weeks, no medication changes 07/16/21-Spencer Copland,MD(fam med)-right shoulder pain,xrays,injection with kenalog and lidocaine.  Recent consult visits: 02/12/22 Dr Gabriel Carina (Endocrine): Stop Tradjenta. Start Trulicity 1.5 mg. 01/08/67-TLXB Solum,MD(endo)- increase Tresiba to 16 units at bedtime, reduce glimepiride to 63m take 2 tablets in am, restart farxiga 52mtake 1 tablet morning.She has been working with primary care provider and Cardiology on adjusting her BP medications. No longer taking losartan. F/u 4-6 weeks  12/25/21-Timothy Gollan,MD(cardio)-f/u heart disease,EKG, imaging reviewed,Recommend she increase Hydrlazine 50 TID,Recommend she increase Coreg 12.5 TID,monitor BP daily,f/u 6 months 12/17/21-Anna Solum,MD(endo)-Telephone call to report BG's running high 11/06/21-Anna Solum,MD-(endo)-f/u DM,Stop glimepiride, replace with Tradjenta 5 mg daliy. F/u 3 months w labs 08/01/21-Cary Ward,MD(cardio)-f/u, start some PT,ride stationary bike,no medication changes, f/u 6 months 07/27/21-Anna Solum,MD(endo)-f/u DM,Add Trulicity 0.2.62g weekly.f/u 3 months   Hospital visits: 11/29/21 - 11/30/21 [facility]. Given IVF. Resume carvedilol, hold losartan/hctz, hold hydralazine, imdur  11/19/21 - 11/24/21 Admission ([facility]): Failure to thrive, AKI. Increased coreg to 25 mg BID. D/C losartan/hctz, d/c plavix (no need for DAPT). Held metformin. Removed  Farxiga (cost)   Objective:  Lab Results  Component Value Date   CREATININE 1.67 (H) 01/01/2022   BUN 30 (H) 01/01/2022   GFR 27.85 (L) 01/01/2022   GFRNONAA 28 (L) 11/30/2021   GFRAA 41 (L) 06/15/2018   NA 140 01/01/2022   K 5.1 01/01/2022   CALCIUM 8.8 01/01/2022   CO2 31 01/01/2022   GLUCOSE 170 (H) 01/01/2022    Lab Results  Component Value Date/Time   HGBA1C 8.0 (H) 11/29/2021 04:16 PM   HGBA1C endo 01/03/2019 12:00 AM   HGBA1C 7.7 (H) 05/02/2017 09:40 AM   GFR 27.85 (L) 01/01/2022 10:29 AM   GFR 34.72 (L) 12/17/2021 12:44 PM   MICROALBUR 78.0 (H) 04/22/2016 02:55 PM    Last diabetic Eye exam:  Lab Results  Component Value Date/Time   HMDIABEYEEXA No Retinopathy 03/31/2020 12:00 AM    Last diabetic Foot exam: No results found for: "HMDIABFOOTEX"   Lab Results  Component Value Date   CHOL 99 06/12/2020   HDL 33.20 (L) 06/12/2020   LDLCALC 46 06/10/2019   LDLDIRECT 46.0 06/12/2020   TRIG 209.0 (H) 06/12/2020   CHOLHDL 3 06/12/2020       Latest Ref Rng & Units 11/29/2021   12:53 PM 11/28/2021    3:37 PM 11/05/2021   11:17 AM  Hepatic Function  Total Protein 6.5 - 8.1 g/dL 5.3  5.4  5.5   Albumin 3.5 - 5.0 g/dL 2.8  3.4  3.3   AST 15 - 41 U/L '21  19  15   ' ALT 0 - 44 U/L '16  13  8   ' Alk Phosphatase 38 - 126 U/L 69  66  55   Total Bilirubin 0.3 - 1.2 mg/dL 1.0  0.4  0.6   Bilirubin, Direct 0.0 - 0.2 mg/dL 0.1   0.1     Lab Results  Component Value Date/Time   TSH 2.34 11/28/2021 03:37 PM   TSH  2.62 06/16/2020 11:18 AM   FREET4 1.46 06/16/2020 11:18 AM       Latest Ref Rng & Units 12/07/2021    1:37 PM 11/30/2021    2:22 AM 11/29/2021   12:53 PM  CBC  WBC 3.8 - 10.8 Thousand/uL 6.4  6.2  7.4   Hemoglobin 11.7 - 15.5 g/dL 11.0  10.4  10.6   Hematocrit 35.0 - 45.0 % 34.4  31.6  32.5   Platelets 140 - 400 Thousand/uL 179  177  185     No results found for: "VD25OH"  Clinical ASCVD: Yes  The ASCVD Risk score (Arnett DK, et al., 2019) failed to  calculate for the following reasons:   The 2019 ASCVD risk score is only valid for ages 31 to 61   The patient has a prior MI or stroke diagnosis       11/28/2021    3:03 PM 11/05/2021   10:32 AM 05/30/2021    1:21 PM  Depression screen PHQ 2/9  Decreased Interest 0 0 0  Down, Depressed, Hopeless 0 0 0  PHQ - 2 Score 0 0 0     Social History   Tobacco Use  Smoking Status Never  Smokeless Tobacco Never   BP Readings from Last 3 Encounters:  01/04/22 (!) 144/58  12/25/21 (!) 166/70  12/17/21 (!) 142/80   Pulse Readings from Last 3 Encounters:  01/04/22 69  12/25/21 63  12/17/21 71   Wt Readings from Last 3 Encounters:  01/04/22 141 lb 3.2 oz (64 kg)  12/25/21 142 lb 6 oz (64.6 kg)  12/17/21 140 lb 12.8 oz (63.9 kg)   BMI Readings from Last 3 Encounters:  01/04/22 26.68 kg/m  12/25/21 26.90 kg/m  12/17/21 26.60 kg/m    Assessment/Interventions: Review of patient past medical history, allergies, medications, health status, including review of consultants reports, laboratory and other test data, was performed as part of comprehensive evaluation and provision of chronic care management services.   SDOH:  (Social Determinants of Health) assessments and interventions performed: No - done 05/2021 SDOH Interventions    Flowsheet Row Chronic Care Management from 12/22/2020 in [place] [facility] [place] from 06/10/2019 in [place] at [place] Interventions    Depression Interventions/Treatment  -- WRU0-4 Score <4 Follow-up Not Indicated  Financial Strain Interventions Intervention Not Indicated --      [place]: No Food Insecurity (05/30/2021)  Housing: Low Risk  (05/30/2021)  Transportation Needs: No Transportation Needs (05/30/2021)  Alcohol Screen: Low Risk  (05/30/2021)  Depression (PHQ2-9): Low Risk  (11/28/2021)  Financial Resource Strain: Low Risk  (05/30/2021)  Physical Activity: Inactive  (05/30/2021)  Social Connections: Moderately Isolated (05/30/2021)  Stress: No Stress Concern Present (05/30/2021)  Tobacco Use: Low Risk  (01/04/2022)    CCM Care Plan  Allergies  Allergen Reactions   Ace Inhibitors Swelling    REACTION: tongue swelling   Lisinopril Swelling    Swelling of tongue   Insulin Detemir Itching   Nsaids Other (See Comments)    Renal issues   Codeine Nausea Only   Hydrochlorothiazide Other (See Comments)    Dizziness/funny feeling   Tylenol [Acetaminophen] Other (See Comments)    Fatty liver - use with caution    Medications Reviewed Today     Reviewed by [name], [facility] (Pharmacist) on 02/27/22 at 1228  Med List Status: <None>   Medication Order Taking? Sig Documenting Provider Last Dose Status  Informant  amLODipine (NORVASC) 2.5 MG tablet [id number] Yes Take 2.5 mg by mouth at bedtime. Justin Mend, MD Taking Active   aspirin EC 81 MG tablet [id number] Yes Take 1 tablet (81 mg total) by mouth daily. Swallow whole. Minna Merritts, MD Taking Active Multiple Informants  atorvastatin (LIPITOR) 80 MG tablet [id number] Yes Take 1 tablet (80 mg total) by mouth daily. Minna Merritts, MD Taking Active   carvedilol (COREG) 12.5 MG tablet [id number] Yes Take 1 tablet (12.5 mg total) by mouth in the morning, at noon, and at bedtime.  Patient taking differently: Take 12.5 mg by mouth. 2 in AM, 1 in PM   [place], Kathlene November, MD Taking Active   cetirizine (ZYRTEC) 10 MG tablet [id number] Yes Take 10 mg by mouth daily. [provider] Taking Active Multiple Informants  clotrimazole-betamethasone (LOTRISONE) cream [id number] Yes Apply 1 application topically as needed (groin).  [provider] Taking Active Multiple Informants           Med Note Ebony Hail, AMBER B   Wed Jun 10, 2018  5:40 PM)    dapagliflozin propanediol (FARXIGA) 5 MG TABS tablet [id number] Yes Take 5 mg by mouth daily. [provider] Taking Active   famotidine  (PEPCID) 20 MG tablet [id number] Yes Take 1 tablet by mouth twice daily Tower, [name], MD Taking Active   gabapentin (NEURONTIN) 300 MG capsule [id number] Yes Take 1 capsule (300 mg total) by mouth 3 (three) times daily. Tower, [name], MD Taking Active Multiple Informants  glimepiride (AMARYL) 1 MG tablet [id number] Yes Take 1-2 tablets (1-2 mg total) by mouth daily with breakfast. Tonia Ghent, MD Taking Active   hydrALAZINE (APRESOLINE) 50 MG tablet [id number] Yes Take 1 tablet (50 mg total) by mouth 3 (three) times daily. Minna Merritts, MD Taking Active   insulin degludec (TRESIBA) 100 UNIT/ML FlexTouch Pen [id number] Yes Inject 16 Units into the skin at bedtime. [provider] Taking Active   isosorbide mononitrate (IMDUR) 60 MG 24 hr tablet [id number] Yes Take 1 tablet (60 mg total) by mouth in the morning and at bedtime. Minna Merritts, MD Taking Active   letrozole [facility]) 2.5 MG tablet [id number] Yes Take 2.5 mg by mouth daily. [provider] Taking Active Multiple Informants  nystatin cream (MYCOSTATIN) [id number] Yes Apply 1 application topically 2 (two) times daily as needed (irritation.).  [provider] Taking Active Multiple Informants  pantoprazole (PROTONIX) 40 MG tablet [id number] Yes Take 1 tablet by mouth once daily Tower, [name], MD Taking Active Multiple Informants  traMADol (ULTRAM) 50 MG tablet [id number] Yes TAKE 1 TO 2 TABLETS BY MOUTH EVERY 8 HOURS AS NEEDED FOR MODERATE TO SEVERE PAIN Tower, [name], MD Taking Active   TRULICITY 1.5 SF/6.8LE SOPN [id number] Yes Inject 1.5 mg into the skin once a week. Judi Cong, MD Taking Active   [name] [facility]) 75 MG TABS [id number] Yes Take 75 mg by mouth daily. Tower, [name], MD Taking Active   vitamin B-12 (CYANOCOBALAMIN) 1000 MCG tablet [id number] Yes Take 1,000 mcg by mouth daily.  [provider] Taking Active Multiple Informants  Vitamin D, Ergocalciferol, (DRISDOL) 1.25 MG (50000 UT)  CAPS capsule [id number] Yes Take 50,000 Units by mouth every 7 (seven) days. [provider] Taking Active Multiple Informants           Med Note Tonia Ghent   Fri Dec 07, 2021 12:51 PM)  Patient Active Problem List   Diagnosis Date Noted   Near syncope 11/29/2021   Hypotension 11/29/2021   Generalized weakness 11/28/2021   Encounter for medication review 11/28/2021   Nausea 11/05/2021   Shoulder pain 04/25/2021   History of breast cancer 06/18/2020   Anemia 06/16/2020   Elevated TSH 06/16/2020   Occipital neuralgia 02/14/2020   Hair loss 11/09/2018   Leg weakness, bilateral 06/29/2018   Pedal edema 06/22/2018   AICD (automatic cardioverter/defibrillator) present 06/11/2018   GERD (gastroesophageal reflux disease) 06/10/2018   AKI (acute kidney injury) ([place]) 06/10/2018   Cholecystitis 06/10/2018   Tingling 10/06/2017   Chronic back pain 40/34/7425   Chronic systolic (congestive) heart failure ([place]) 02/25/2017   Osteopenia 06/16/2016   Routine general medical examination at a health care facility 04/24/2016   Estrogen deficiency 04/24/2016   Urticaria 11/03/2015   Cerumen impaction 03/10/2015   Encounter for Medicare annual wellness exam 06/13/2014   Stroke, small vessel ([place]) 01/18/2014   Family history of hemochromatosis 01/18/2014   Cerebral thrombosis with cerebral infarction ([place]) 01/14/2014   Expressive aphasia 01/13/2014   Nonischemic cardiomyopathy ([place]) 04/05/2013   Shortness of breath 09/03/2012   Elevated transaminase level 05/18/2012   Spinal stenosis of lumbar region 12/10/2011   Mixed incontinence urge and stress 12/10/2011   Renal insufficiency 11/04/2011   Goiter 01/14/2011   Fatty liver 08/28/2010   Type II diabetes mellitus with renal manifestations ([place]) 07/09/2010   Hyperlipidemia associated with type 2 diabetes mellitus ([place]) 07/09/2010   Hyperkalemia 07/09/2010   REFLEX SYMPATHETIC DYSTROPHY 07/09/2010   Neuropathy  07/09/2010   Essential hypertension 07/09/2010   CARDIOMYOPATHY 07/09/2010   OVERACTIVE BLADDER 07/09/2010    Immunization History  Administered Date(s) Administered   Fluad Quad(high Dose 65+) 02/14/2020, 02/15/2021   Influenza,inj,Quad PF,6+ Mos 05/24/2015, 04/03/2016, 03/09/2018   Influenza-Unspecified 03/29/2013, 04/11/2014, 04/03/2016, 03/17/2017   PFIZER(Purple Top)SARS-COV-2 Vaccination 07/16/2019, 08/06/2019, 03/03/2020   Pneumococcal Conjugate-13 06/13/2014   Pneumococcal Polysaccharide-23 01/14/2011    Conditions to be addressed/monitored:  Hypertension, Hyperlipidemia, Diabetes, Heart Failure, Coronary Artery Disease, and Chronic Kidney Disease  There are no care plans that you recently modified to display for this patient.     Medication Assistance:  [place] (Cardiomyopathy) 12/25/21 - 12/25/22 ID: [id number] BIN: [id number] PCN: [id number] [id number]  Compliance/Adherence/Medication fill history: Care Gaps: Mammogram (due 11/14/20) Eye exam (due 03/31/21) Foot exam (due 06/16/21)  Star-Rating Drugs: Atorvastatin - PDC 94% Farxiga - PDC 75% (LF 01/03/22 x 90 ds) Tradjenta - PDC 100% Glimepiride - PDC 100%  Medication Access: Within the past 30 days, how often has patient missed a dose of medication? 0 Is a pillbox or other method used to improve adherence? Yes  Factors that may affect medication adherence? Confusion over doses prescribed Are meds synced by current pharmacy? No  Are meds delivered by current pharmacy? No  Does patient experience delays in picking up medications due to transportation concerns? No   [facility] Reviewed: Is patient disadvantaged to use [facility]?: No  Current Rx insurance plan: [employer] [place] Name and location of Current pharmacy:  [facility] [address], [place] - [place] [address] Phone: [phone] Fax: [phone]  [facility] services  reviewed with patient today?: No  Patient requests to transfer care to [facility]?: No  Reason patient declined to change pharmacies: Not mentioned at this visit   Care Plan and Follow Up Patient Decision:  Patient agrees to Care Plan and  Follow-up.  Plan: Face to Face appointment with care management team member scheduled for: TBD  [name], PharmD, BCACP Clinical Pharmacist [facility] [phone]    Current Barriers:  Unable to maintain control of DM  Pharmacist Clinical Goal(s):  Patient will achieve improvement in DM as evidenced by A1c < 8% through collaboration with PharmD and provider.   Interventions: 1:1 collaboration with Tower, [name], MD regarding development and update of comprehensive plan of care as evidenced by provider attestation and co-signature Inter-disciplinary care team collaboration (see longitudinal plan of care) Comprehensive medication review performed; medication list updated in electronic medical record  Hypertension / Heart Failure (BP goal <140/90) -Improved - BP generally < 140/90 at home, improved with switch from [name] to [place] -Current home readings: SBP 139 this week -Denies hypotensive symptoms, but does report headache when BP is elevated. -Last ejection fraction: 45-50% (Date: 11/30/21) - improved from 15-20% (2015) -HF type: Preserved; NYHA Class: II-III -Current treatment: Carvedilol 12.5 mg - 2 AM, 1 PM - Appropriate, Query Effective Hydralazine 50 mg TID -Appropriate, Query Effective Isosorbide [place] 60 mg BID -Appropriate, Query Effective Amlodipine 2.5 mg daily HS -Appropriate, Query Effective Farxiga 5 mg daily (HW) -Appropriate, Query Effective -Medications previously tried: furosemide, HCTZ, losartan, lisinopril (tongue swelling) -Educated on BP goals and benefits of medications for prevention of heart attack, stroke and kidney damage; -Counseled to monitor BP at home daily for 1-2  weeks -Reviewed finances - pt enrolled in [employer] for Farxiga (Cardiomyopathy) -Recommended to continue current medication; updated med list  Hyperlipidemia: (LDL goal < 70) -Controlled - LDL 46 (06/2020) at goal, due for repeat lipid panel -Hx stroke, NICM -Current treatment: Atorvastatin 80 mg daily - Appropriate, Effective, Safe, Accessible Aspirin 81 mg daily - Appropriate, Effective, Safe, Accessible -Medications previously tried: pravastatin  -Educated on Cholesterol goals;  -Recommended to continue current medication; repeat lipid panel at next OV  Diabetes (A1c goal <8%) -Uncontrolled - A1c 9.8% (11/2021) worsening; she has not been taking metformin since July 5th due to GFR < 30; she was recently switched from [place] to Trulicity and insulin was increased to 16 units. She has had some low sugars since starting Trulicity, discussed we may need to reduce insulin dose -Current home glucose readings: 136  3am: 73 (sweating) -Current medications: Farxiga 5 mg daily (HW)- Appropriate, Query Effective (for DM) Glimepiride 1 mg (1-2 tab) daily AM (started ~2012)- Appropriate, Effective, Query Safe Tresiba 16 units HS -Appropriate, Effective, Query Safe Trulicity 1.5 mg weekly - Appropriate, Query Effective -Medications previously tried: Geneticist, molecular (cost), Tradjenta (started Trulicity), metformin (GFR < 30) -Reviewed [name] is likely not very effective for DM control with GFR <30, though it should still remain on board for help with HF and CKD -Discussed benefits of CGM, pt would like to try [employer] 3. Ordered FL3 to [place] via [place] -Recommended to continue current medication  Chronic Kidney Disease Stage 4  -All medications assessed for renal dosing and appropriateness in chronic kidney disease. -Pt is on Farxiga. Pt  is not on ACE/ARB due to hx of tongue swelling with lisinopril. -Recommended to continue current medication  GERD (Goal: minimize symptoms of  reflux ) -Controlled - per pt report -Current treatment  Pantoprazole 40 mg daily - Appropriate, Effective, Safe, Accessible Famotidine 20 mg BID -Appropriate, Effective, Safe, Accessible -Medications previously tried: n/a -Hx of Bleeds/ulcers: No -Counseled on small meals, elevating head, and sleeping on left side -Recommended to continue current medication  OAB (Goal: minimize nocturia) -Not ideally controlled - pt has switched from [name] to [place] due to high BP issues; she reports OAB control is about the same, still urinates frequently -she has appt with pelvic PT this week -Current treatment  [name] 70 mg daily - Appropriate, Effective, Safe, Accessible -Medications previously tried: Toviaz, oxybutynin, tolterodine  -Reviewed benefits of PT -Recommend to continue current medication  Neuropathy (Goal: manage symptoms) -Controlled -Current treatment  Gabapentin 300 mg TID - Appropriate, Effective, Safe, Accessible -Medications previously tried: n/a  -Recommended to continue current medication  Breast cancer (Goal: prevent recurrence) -Controlled  -Follows with oncology (Dr Rip Harbour, [facility]). Per oncology, plan for 7-year course of Letrozole, will be completed 2024 -Current treatment  Letrozole 2.5 mg daily - Appropriate, Effective, Safe, Accessible -Medications previously tried: n/a  -Recommended to continue current medication  Patient Goals/Self-Care Activities Patient will:  - take medications as prescribed as evidenced by patient report and record review focus on medication adherence by routine check glucose daily, document, and provide at future appointments check blood pressure daily, document, and provide at future appointments collaborate with provider on medication access solutions

## 2022-03-06 ENCOUNTER — Other Ambulatory Visit: Payer: Self-pay | Admitting: Family Medicine

## 2022-03-07 DIAGNOSIS — I083 Combined rheumatic disorders of mitral, aortic and tricuspid valves: Secondary | ICD-10-CM | POA: Diagnosis not present

## 2022-03-07 DIAGNOSIS — M549 Dorsalgia, unspecified: Secondary | ICD-10-CM | POA: Diagnosis not present

## 2022-03-07 DIAGNOSIS — M6281 Muscle weakness (generalized): Secondary | ICD-10-CM | POA: Diagnosis not present

## 2022-03-07 DIAGNOSIS — Z7984 Long term (current) use of oral hypoglycemic drugs: Secondary | ICD-10-CM | POA: Diagnosis not present

## 2022-03-07 DIAGNOSIS — M48061 Spinal stenosis, lumbar region without neurogenic claudication: Secondary | ICD-10-CM | POA: Diagnosis not present

## 2022-03-07 DIAGNOSIS — M8580 Other specified disorders of bone density and structure, unspecified site: Secondary | ICD-10-CM | POA: Diagnosis not present

## 2022-03-07 DIAGNOSIS — I110 Hypertensive heart disease with heart failure: Secondary | ICD-10-CM | POA: Diagnosis not present

## 2022-03-07 DIAGNOSIS — I5022 Chronic systolic (congestive) heart failure: Secondary | ICD-10-CM | POA: Diagnosis not present

## 2022-03-07 DIAGNOSIS — E785 Hyperlipidemia, unspecified: Secondary | ICD-10-CM | POA: Diagnosis not present

## 2022-03-07 DIAGNOSIS — E1169 Type 2 diabetes mellitus with other specified complication: Secondary | ICD-10-CM | POA: Diagnosis not present

## 2022-03-07 DIAGNOSIS — E875 Hyperkalemia: Secondary | ICD-10-CM | POA: Diagnosis not present

## 2022-03-07 DIAGNOSIS — E1142 Type 2 diabetes mellitus with diabetic polyneuropathy: Secondary | ICD-10-CM | POA: Diagnosis not present

## 2022-03-07 DIAGNOSIS — Z9181 History of falling: Secondary | ICD-10-CM | POA: Diagnosis not present

## 2022-03-07 DIAGNOSIS — N3281 Overactive bladder: Secondary | ICD-10-CM | POA: Diagnosis not present

## 2022-03-07 DIAGNOSIS — N3946 Mixed incontinence: Secondary | ICD-10-CM | POA: Diagnosis not present

## 2022-03-07 DIAGNOSIS — E1122 Type 2 diabetes mellitus with diabetic chronic kidney disease: Secondary | ICD-10-CM | POA: Diagnosis not present

## 2022-03-07 DIAGNOSIS — Z794 Long term (current) use of insulin: Secondary | ICD-10-CM | POA: Diagnosis not present

## 2022-03-07 DIAGNOSIS — N318 Other neuromuscular dysfunction of bladder: Secondary | ICD-10-CM | POA: Diagnosis not present

## 2022-03-07 DIAGNOSIS — D631 Anemia in chronic kidney disease: Secondary | ICD-10-CM | POA: Diagnosis not present

## 2022-03-07 DIAGNOSIS — N184 Chronic kidney disease, stage 4 (severe): Secondary | ICD-10-CM | POA: Diagnosis not present

## 2022-03-07 DIAGNOSIS — I959 Hypotension, unspecified: Secondary | ICD-10-CM | POA: Diagnosis not present

## 2022-03-07 DIAGNOSIS — Z95810 Presence of automatic (implantable) cardiac defibrillator: Secondary | ICD-10-CM | POA: Diagnosis not present

## 2022-03-07 DIAGNOSIS — G8929 Other chronic pain: Secondary | ICD-10-CM | POA: Diagnosis not present

## 2022-03-07 DIAGNOSIS — I429 Cardiomyopathy, unspecified: Secondary | ICD-10-CM | POA: Diagnosis not present

## 2022-03-14 ENCOUNTER — Telehealth: Payer: Self-pay

## 2022-03-14 NOTE — Chronic Care Management (AMB) (Signed)
    Chronic Care Management Pharmacy Assistant   Name: Morgan Hubbard  MRN: [id number] DOB: 12-30-1934  Reason for Encounter: Reminder Call   Medications: Outpatient Encounter Medications as of 03/14/2022  Medication Sig   amLODipine (NORVASC) 2.5 MG tablet Take 2.5 mg by mouth at bedtime.   aspirin EC 81 MG tablet Take 1 tablet (81 mg total) by mouth daily. Swallow whole.   atorvastatin (LIPITOR) 80 MG tablet Take 1 tablet (80 mg total) by mouth daily.   carvedilol (COREG) 12.5 MG tablet Take 1 tablet (12.5 mg total) by mouth in the morning, at noon, and at bedtime. (Patient taking differently: Take 12.5 mg by mouth. 2 in AM, 1 in PM)   cetirizine (ZYRTEC) 10 MG tablet Take 10 mg by mouth daily.   clotrimazole-betamethasone (LOTRISONE) cream Apply 1 application topically as needed (groin).    dapagliflozin propanediol (FARXIGA) 5 MG TABS tablet Take 5 mg by mouth daily.   famotidine (PEPCID) 20 MG tablet Take 1 tablet by mouth twice daily   gabapentin (NEURONTIN) 300 MG capsule Take 1 capsule (300 mg total) by mouth 3 (three) times daily.   glimepiride (AMARYL) 1 MG tablet Take 1-2 tablets (1-2 mg total) by mouth daily with breakfast.   hydrALAZINE (APRESOLINE) 50 MG tablet Take 1 tablet (50 mg total) by mouth 3 (three) times daily.   insulin degludec (TRESIBA) 100 UNIT/ML FlexTouch Pen Inject 16 Units into the skin at bedtime.   isosorbide mononitrate (IMDUR) 60 MG 24 hr tablet Take 1 tablet (60 mg total) by mouth in the morning and at bedtime.   letrozole (FEMARA) 2.5 MG tablet Take 2.5 mg by mouth daily.   nystatin cream (MYCOSTATIN) Apply 1 application topically 2 (two) times daily as needed (irritation.).    pantoprazole (PROTONIX) 40 MG tablet Take 1 tablet by mouth once daily   traMADol (ULTRAM) 50 MG tablet TAKE 1 TO 2 TABLETS BY MOUTH EVERY 8 HOURS AS NEEDED FOR MODERATE TO SEVERE PAIN   TRULICITY 1.5 GY/6.9SW SOPN Inject 1.5 mg into the skin once a week.   [name] ([name]) 75  MG TABS Take 75 mg by mouth daily.   vitamin B-12 (CYANOCOBALAMIN) 1000 MCG tablet Take 1,000 mcg by mouth daily.    Vitamin D, Ergocalciferol, (DRISDOL) 1.25 MG (50000 UT) CAPS capsule Take 50,000 Units by mouth every 7 (seven) days.   No facility-administered encounter medications on file as of 03/14/2022.   Morgan Hubbard was contacted to remind of upcoming telephone visit with [name] on 03/19/22 at 10:00. Patient was reminded to have any blood glucose and blood pressure readings available for review at appointment.   Patient confirmed appointment.  Are you having any problems with your medications? No   Do you have any concerns you like to discuss with the pharmacist? No  CCM referral has been placed prior to visit?  Yes   Star Rating Drugs: Medication:  Last Fill: Day Supply Atorvastatin '80mg'$  12/25/21 90 Farxiga '5mg'$   01/03/22  90 Glimepiride 1 mg 01/03/22  90 Tresiba  5/46/27 035 Trulicity  0/09/38 28 Losartan/hctz 100-12.'5mg'$   12/23/21 90 Metformin '1000mg'$  12/23/21 [place], CPP notified  [name], [place]  [phone]

## 2022-03-19 ENCOUNTER — Ambulatory Visit: Payer: Medicare HMO | Admitting: Pharmacist

## 2022-03-19 VITALS — BP 124/64

## 2022-03-19 DIAGNOSIS — I10 Essential (primary) hypertension: Secondary | ICD-10-CM

## 2022-03-19 DIAGNOSIS — I5022 Chronic systolic (congestive) heart failure: Secondary | ICD-10-CM

## 2022-03-19 DIAGNOSIS — E1169 Type 2 diabetes mellitus with other specified complication: Secondary | ICD-10-CM

## 2022-03-19 DIAGNOSIS — E1129 Type 2 diabetes mellitus with other diabetic kidney complication: Secondary | ICD-10-CM

## 2022-03-19 NOTE — Progress Notes (Signed)
Chronic Care Management Pharmacy Note  03/19/22 Name:  Morgan Hubbard MRN:  [id number] DOB:  01-17-1935  Summary: CCM F/U visit -DM: A1c 9.8% (02/2022), she is wearing Mickie Kay and reports average glucose 161 the past 2 weeks which is a significant improvement (est A1c ~7.2%); she is having some lows in 70s around 4-5am and she drinks apple juice to bring it up; she is taking Tresiba 16 units at night and glimepiride 1 mg - 2 tab in AM (prescribed 1-2 tab) -HTN: Pt reports BP generally < 140/90, improved with switch from [name] to [place] and addition of amlodipine. OAB control is similar.  Recommendations/Changes made from today's visit: -Advised to move Tresiba to AM and to limit glimepiride to 1 tablet/day  Plan: -Pharmacist follow up visit 2 weeks    Subjective: Morgan Hubbard is an 87 y.o. year old female who is a primary patient of Tower, [name], MD.  The CCM team was consulted for assistance with disease management and care coordination needs.    Engaged with patient by telephone for follow up visit in response to provider referral for pharmacy case management and/or care coordination services.   Consent to Services:  The patient was given information about Chronic Care Management services, agreed to services, and gave verbal consent prior to initiation of services.  Please see initial visit note for detailed documentation.   Patient Care Team: Tower, [name], MD as PCP - General (Family Medicine) Carolan Clines, MD (Inactive) as Attending Physician (Urology) Minna Merritts, MD as Consulting Physician (Cardiology) Daryll Brod, MD as Referring Physician (Cardiology) Oneta Rack, MD as Consulting Physician (Dermatology) Ward, Jeani Hawking, MD as Referring Physician (Internal Medicine) Wynona Canes, MD as Referring Physician (Neurology) Kimmick, Linus Mako, MD as Referring Physician (Oncology) Gabriel Carina Betsey Holiday, MD as Physician Assistant (Internal  Medicine) [name], MD as Consulting Physician (Family Medicine) Alvester Chou., DDS as Consulting Physician (Dentistry) Birder Robson, MD as Referring Physician (Ophthalmology) [name], [facility] as Pharmacist (Pharmacist)  Recent office visits: 01/04/22-[name],MD(PCP)- right ear fullness(New-farxiga- very $$ but does not hurt the kidney)Gfr of 27.85-K is back in nl range -impaction removed with simple ear irrigation and tolerated well   12/17/21-[name],MD(PCP)-f/u AKI,Labs,May need insulin in the future ,start PT,increase fluid intake.(Potassium is back into the normal range,Kidney numbers are stable to slightly improved ) 12/07/21-Graham Duncan,MD(fam med)-hospital f/u Hold metformin for now.  Continue linagliptin.  I would try restarting glimepiride 10m with breakfast.  If sugar is still above 200 after 1 week, then increase to 2 mg with breakfast. Would stay off losartan and hydrochlorothiazide for now.Labs ordered  11/28/21-[name],MD(PCP)-f/u hospital visit,Renal labs today (Worse kidney function - was sent to ER today with malaise and bp issues and dehydration ) If improved will start back losartan hctz - referral for [facility] 11/05/21-[name],MD(PCP)-GI upset,labs(Potassium is mildly high,stable kidney numbers ,Liver tests are stable but protein levels are low Other labs are ok -Adding pepcid 20 mg twice daily  09/19/21-Spencer Copland,MD(fam med)- acute left shoulder pain,rest 2 weeks, no medication changes 07/16/21-Spencer Copland,MD(fam med)-right shoulder pain,xrays,injection with kenalog and lidocaine.  Recent consult visits: 02/12/22 Dr SGabriel Carina(Endocrine): Stop Tradjenta. Start Trulicity 1.5 mg. 87/1/24Dr SGabriel Carina(Endocrine):  increase TTyler Aasto 16 units at bedtime, reduce glimepiride to 174mtake 2 tablets in am, restart farxiga 66m14make 1 tablet morning.She has been working with primary care provider and Cardiology on adjusting her BP medications. No longer  taking losartan.  F/u 4-6 weeks  12/25/21-Timothy Gollan,MD(cardio)-f/u heart disease,EKG, imaging reviewed,Recommend she increase Hydrlazine 50 TID,Recommend she increase Coreg 12.5 TID,monitor BP daily,f/u 6 months 12/17/21-Anna Solum,MD(endo)-Telephone call to report BG's running high 11/06/21-Anna Solum,MD-(endo)-f/u DM,Stop glimepiride, replace with Tradjenta 5 mg daliy. F/u 3 months w labs 08/01/21-Cary Ward,MD(cardio)-f/u, start some PT,ride stationary bike,no medication changes, f/u 6 months 07/27/21-Anna Solum,MD(endo)-f/u DM,Add Trulicity 4.28 mg weekly.f/u 3 months   Hospital visits: 11/29/21 - 11/30/21 Admission [facility]): hypotension. Given IVF. Resume carvedilol, hold losartan/hctz, hold hydralazine, imdur  11/19/21 - 11/24/21 Admission ([facility]): Failure to thrive, AKI. Increased coreg to 25 mg BID. D/C losartan/hctz, d/c plavix (no need for DAPT). Held metformin. Removed Farxiga (cost)   Objective:  Lab Results  Component Value Date   CREATININE 1.67 (H) 01/01/2022   BUN 30 (H) 01/01/2022   GFR 27.85 (L) 01/01/2022   GFRNONAA 28 (L) 11/30/2021   GFRAA 41 (L) 06/15/2018   NA 140 01/01/2022   K 5.1 01/01/2022   CALCIUM 8.8 01/01/2022   CO2 31 01/01/2022   GLUCOSE 170 (H) 01/01/2022    Lab Results  Component Value Date/Time   HGBA1C 8.0 (H) 11/29/2021 04:16 PM   HGBA1C endo 01/03/2019 12:00 AM   HGBA1C 7.7 (H) 05/02/2017 09:40 AM   GFR 27.85 (L) 01/01/2022 10:29 AM   GFR 34.72 (L) 12/17/2021 12:44 PM   MICROALBUR 78.0 (H) 04/22/2016 02:55 PM    Last diabetic Eye exam:  Lab Results  Component Value Date/Time   HMDIABEYEEXA No Retinopathy 03/31/2020 12:00 AM    Last diabetic Foot exam: No results found for: "HMDIABFOOTEX"   Lab Results  Component Value Date   CHOL 99 06/12/2020   HDL 33.20 (L) 06/12/2020   LDLCALC 46 06/10/2019   LDLDIRECT 46.0 06/12/2020   TRIG 209.0 (H) 06/12/2020   CHOLHDL 3 06/12/2020       Latest Ref Rng & Units 11/29/2021   12:53 PM 11/28/2021     3:37 PM 11/05/2021   11:17 AM  Hepatic Function  Total Protein 6.5 - 8.1 g/dL 5.3  5.4  5.5   Albumin 3.5 - 5.0 g/dL 2.8  3.4  3.3   AST 15 - 41 U/L '21  19  15   ' ALT 0 - 44 U/L '16  13  8   ' Alk Phosphatase 38 - 126 U/L 69  66  55   Total Bilirubin 0.3 - 1.2 mg/dL 1.0  0.4  0.6   Bilirubin, Direct 0.0 - 0.2 mg/dL 0.1   0.1     Lab Results  Component Value Date/Time   TSH 2.34 11/28/2021 03:37 PM   TSH 2.62 06/16/2020 11:18 AM   FREET4 1.46 06/16/2020 11:18 AM       Latest Ref Rng & Units 12/07/2021    1:37 PM 11/30/2021    2:22 AM 11/29/2021   12:53 PM  CBC  WBC 3.8 - 10.8 Thousand/uL 6.4  6.2  7.4   Hemoglobin 11.7 - 15.5 g/dL 11.0  10.4  10.6   Hematocrit 35.0 - 45.0 % 34.4  31.6  32.5   Platelets 140 - 400 Thousand/uL 179  177  185     No results found for: "VD25OH"  Clinical ASCVD: Yes  The ASCVD Risk score (Arnett DK, et al., 2019) failed to calculate for the following reasons:   The 2019 ASCVD risk score is only valid for ages 57 to 10   The patient has a prior MI or stroke diagnosis       11/28/2021  3:03 PM 11/05/2021   10:32 AM 05/30/2021    1:21 PM  Depression screen PHQ 2/9  Decreased Interest 0 0 0  Down, Depressed, Hopeless 0 0 0  PHQ - 2 Score 0 0 0     Social History   Tobacco Use  Smoking Status Never  Smokeless Tobacco Never   BP Readings from Last 3 Encounters:  03/19/22 124/64  01/04/22 (!) 144/58  12/25/21 (!) 166/70   Pulse Readings from Last 3 Encounters:  01/04/22 69  12/25/21 63  12/17/21 71   Wt Readings from Last 3 Encounters:  01/04/22 141 lb 3.2 oz (64 kg)  12/25/21 142 lb 6 oz (64.6 kg)  12/17/21 140 lb 12.8 oz (63.9 kg)   BMI Readings from Last 3 Encounters:  01/04/22 26.68 kg/m  12/25/21 26.90 kg/m  12/17/21 26.60 kg/m    Assessment/Interventions: Review of patient past medical history, allergies, medications, health status, including review of consultants reports, laboratory and other test data, was performed as  part of comprehensive evaluation and provision of chronic care management services.   SDOH:  (Social Determinants of Health) assessments and interventions performed: No - done 05/2021 SDOH Interventions    Flowsheet Row Chronic Care Management from 12/22/2020 in [place] at [place] from 06/10/2019 in [place] at [place] Interventions    Depression Interventions/Treatment  -- TIR4-4 Score <4 Follow-up Not Indicated  Financial Strain Interventions Intervention Not Indicated --      [place]: No Food Insecurity (05/30/2021)  Housing: Low Risk  (05/30/2021)  Transportation Needs: No Transportation Needs (05/30/2021)  Alcohol Screen: Low Risk  (05/30/2021)  Depression (PHQ2-9): Low Risk  (11/28/2021)  Financial Resource Strain: Low Risk  (05/30/2021)  Physical Activity: Inactive (05/30/2021)  Social Connections: Moderately Isolated (05/30/2021)  Stress: No Stress Concern Present (05/30/2021)  Tobacco Use: Low Risk  (01/04/2022)    CCM Care Plan  Allergies  Allergen Reactions   Ace Inhibitors Swelling    REACTION: tongue swelling   Lisinopril Swelling    Swelling of tongue   Insulin Detemir Itching   Nsaids Other (See Comments)    Renal issues   Codeine Nausea Only   Hydrochlorothiazide Other (See Comments)    Dizziness/funny feeling   Tylenol [Acetaminophen] Other (See Comments)    Fatty liver - use with caution    Medications Reviewed Today     Reviewed by [name], [facility] (Pharmacist) on 02/27/22 at 1228  Med List Status: <None>   Medication Order Taking? Sig Documenting Provider Last Dose Status Informant  amLODipine (NORVASC) 2.5 MG tablet [id number] Yes Take 2.5 mg by mouth at bedtime. Justin Mend, MD Taking Active   aspirin EC 81 MG tablet [id number] Yes Take 1 tablet (81 mg total) by mouth daily. Swallow whole. Minna Merritts, MD Taking Active Multiple Informants  atorvastatin  (LIPITOR) 80 MG tablet [id number] Yes Take 1 tablet (80 mg total) by mouth daily. Minna Merritts, MD Taking Active   carvedilol (COREG) 12.5 MG tablet [id number] Yes Take 1 tablet (12.5 mg total) by mouth in the morning, at noon, and at bedtime.  Patient taking differently: Take 12.5 mg by mouth. 2 in AM, 1 in PM   [place], Kathlene November, MD Taking Active   cetirizine (ZYRTEC) 10 MG tablet [id number] Yes Take 10 mg by mouth daily. [provider] Taking Active Multiple Informants  clotrimazole-betamethasone (LOTRISONE) cream [id number] Yes Apply 1 application topically as needed (  groin).  [provider] Taking Active Multiple Informants           Med Note Ebony Hail, AMBER B   Wed Jun 10, 2018  5:40 PM)    dapagliflozin propanediol (FARXIGA) 5 MG TABS tablet [id number] Yes Take 5 mg by mouth daily. [provider] Taking Active   famotidine (PEPCID) 20 MG tablet [id number] Yes Take 1 tablet by mouth twice daily Tower, [name], MD Taking Active   gabapentin (NEURONTIN) 300 MG capsule [id number] Yes Take 1 capsule (300 mg total) by mouth 3 (three) times daily. Tower, [name], MD Taking Active Multiple Informants  glimepiride (AMARYL) 1 MG tablet [id number] Yes Take 1-2 tablets (1-2 mg total) by mouth daily with breakfast. Tonia Ghent, MD Taking Active   hydrALAZINE (APRESOLINE) 50 MG tablet [id number] Yes Take 1 tablet (50 mg total) by mouth 3 (three) times daily. Minna Merritts, MD Taking Active   insulin degludec (TRESIBA) 100 UNIT/ML FlexTouch Pen [id number] Yes Inject 16 Units into the skin at bedtime. [provider] Taking Active   isosorbide mononitrate (IMDUR) 60 MG 24 hr tablet [id number] Yes Take 1 tablet (60 mg total) by mouth in the morning and at bedtime. Minna Merritts, MD Taking Active   letrozole [facility]) 2.5 MG tablet [id number] Yes Take 2.5 mg by mouth daily. [provider] Taking Active Multiple Informants  nystatin cream (MYCOSTATIN)  [id number] Yes Apply 1 application topically 2 (two) times daily as needed (irritation.).  [provider] Taking Active Multiple Informants  pantoprazole (PROTONIX) 40 MG tablet [id number] Yes Take 1 tablet by mouth once daily Tower, [name], MD Taking Active Multiple Informants  traMADol (ULTRAM) 50 MG tablet [id number] Yes TAKE 1 TO 2 TABLETS BY MOUTH EVERY 8 HOURS AS NEEDED FOR MODERATE TO SEVERE PAIN Tower, [name], MD Taking Active   TRULICITY 1.5 AT/5.5DD SOPN [id number] Yes Inject 1.5 mg into the skin once a week. Judi Cong, MD Taking Active   [name] [facility]) 75 MG TABS [id number] Yes Take 75 mg by mouth daily. Tower, [name], MD Taking Active   vitamin B-12 (CYANOCOBALAMIN) 1000 MCG tablet [id number] Yes Take 1,000 mcg by mouth daily.  [provider] Taking Active Multiple Informants  Vitamin D, Ergocalciferol, (DRISDOL) 1.25 MG (50000 UT) CAPS capsule [id number] Yes Take 50,000 Units by mouth every 7 (seven) days. [provider] Taking Active Multiple Informants           Med Note Geradine Girt Dec 07, 2021 12:51 PM)              Patient Active Problem List   Diagnosis Date Noted   Near syncope 11/29/2021   Hypotension 11/29/2021   Generalized weakness 11/28/2021   Encounter for medication review 11/28/2021   Nausea 11/05/2021   Shoulder pain 04/25/2021   History of breast cancer 06/18/2020   Anemia 06/16/2020   Elevated TSH 06/16/2020   Occipital neuralgia 02/14/2020   Hair loss 11/09/2018   Leg weakness, bilateral 06/29/2018   Pedal edema 06/22/2018   AICD (automatic cardioverter/defibrillator) present 06/11/2018   GERD (gastroesophageal reflux disease) 06/10/2018   AKI (acute kidney injury) ([place]) 06/10/2018   Cholecystitis 06/10/2018   Tingling 10/06/2017   Chronic back pain 85/46/2703   Chronic systolic (congestive) heart failure ([place]) 02/25/2017   Osteopenia 06/16/2016   Routine general medical examination at a health care  facility 04/24/2016   Estrogen deficiency 04/24/2016   Urticaria 11/03/2015   Cerumen  impaction 03/10/2015   Encounter for Medicare annual wellness exam 06/13/2014   Stroke, small vessel ([place]) 01/18/2014   Family history of hemochromatosis 01/18/2014   Cerebral thrombosis with cerebral infarction ([place]) 01/14/2014   Expressive aphasia 01/13/2014   Nonischemic cardiomyopathy ([place]) 04/05/2013   Shortness of breath 09/03/2012   Elevated transaminase level 05/18/2012   Spinal stenosis of lumbar region 12/10/2011   Mixed incontinence urge and stress 12/10/2011   Renal insufficiency 11/04/2011   Goiter 01/14/2011   Fatty liver 08/28/2010   Type II diabetes mellitus with renal manifestations ([place]) 07/09/2010   Hyperlipidemia associated with type 2 diabetes mellitus ([place]) 07/09/2010   Hyperkalemia 07/09/2010   REFLEX SYMPATHETIC DYSTROPHY 07/09/2010   Neuropathy 07/09/2010   Essential hypertension 07/09/2010   CARDIOMYOPATHY 07/09/2010   OVERACTIVE BLADDER 07/09/2010    Immunization History  Administered Date(s) Administered   Fluad Quad(high Dose 65+) 02/14/2020, 02/15/2021   Influenza,inj,Quad PF,6+ Mos 05/24/2015, 04/03/2016, 03/09/2018   Influenza-Unspecified 03/29/2013, 04/11/2014, 04/03/2016, 03/17/2017   PFIZER(Purple Top)SARS-COV-2 Vaccination 07/16/2019, 08/06/2019, 03/03/2020   Pneumococcal Conjugate-13 06/13/2014   Pneumococcal Polysaccharide-23 01/14/2011    Conditions to be addressed/monitored:  Hypertension, Hyperlipidemia, Diabetes, Heart Failure, Coronary Artery Disease, and Chronic Kidney Disease  Care Plan : [place]  Updates made by [name], [place] since 03/19/2022 12:00 AM     Problem: Hypertension, Hyperlipidemia, Diabetes, Heart Failure, Coronary Artery Disease, and Chronic Kidney Disease   Priority: High     Long-Range Goal: Disease Management   Start Date: 01/24/2022  Expected End Date: 02/01/2023  This Visit's Progress: On track   Recent Progress: On track  Priority: High  Note:   Current Barriers:  Unable to maintain control of DM  Pharmacist Clinical Goal(s):  Patient will achieve improvement in DM as evidenced by A1c < 8% through collaboration with PharmD and provider.   Interventions: 1:1 collaboration with Tower, [name], MD regarding development and update of comprehensive plan of care as evidenced by provider attestation and co-signature Inter-disciplinary care team collaboration (see longitudinal plan of care) Comprehensive medication review performed; medication list updated in electronic medical record  Hypertension / Heart Failure (BP goal <140/90) -Improved - BP generally < 140/90 at home, improved with switch from [name] to [place] -Current home readings: 124/64 -Denies hypotensive symptoms, but does report headache when BP is elevated. -Last ejection fraction: 45-50% (Date: 11/30/21) - improved from 15-20% (2015) -HF type: Preserved; NYHA Class: II-III -Current treatment: Carvedilol 12.5 mg - 2 AM, 1 PM - Appropriate, Query Effective Hydralazine 50 mg TID -Appropriate, Query Effective Isosorbide [place] 60 mg BID -Appropriate, Query Effective Amlodipine 2.5 mg daily HS -Appropriate, Query Effective Farxiga 5 mg daily (HW) -Appropriate, Query Effective -Medications previously tried: furosemide, HCTZ, losartan, lisinopril (tongue swelling) -Educated on BP goals and benefits of medications for prevention of heart attack, stroke and kidney damage; -Counseled to monitor BP at home daily for 1-2 weeks -Reviewed finances - pt enrolled in [employer] for Farxiga (Cardiomyopathy) -Recommended to continue current medication; updated med list  Hyperlipidemia: (LDL goal < 70) -Controlled - LDL 46 (06/2020) at goal, due for repeat lipid panel -Hx stroke, NICM -Current treatment: Atorvastatin 80 mg daily - Appropriate, Effective, Safe, Accessible Aspirin 81 mg daily - Appropriate, Effective, Safe,  Accessible -Medications previously tried: pravastatin  -Educated on Cholesterol goals;  -Recommended to continue current medication; repeat lipid panel at next OV  Diabetes (A1c goal <8%) -Uncontrolled - A1c 9.8% (02/2022); per Freestyle Libre sugar control is improved; however she  has not been taking Trulicity the last couple weeks because she forgot about it -Current home glucose readings: (Libre 3)  Avg sugar: 7-day avg 161 Drops to 72 around 4-5 am most days -Current medications: Farxiga 5 mg daily (HW)- Appropriate, Query Effective (for DM) Glimepiride 1 mg (1-2 tab) daily AM (started ~2012)- Appropriate, Effective, Query Safe Tresiba 16 units HS -Appropriate, Effective, Query Safe Trulicity 1.5 mg weekly - Appropriate, Effective, Safe, Accessible Freestyle Libre 3 w/ reader - Appropriate, Effective, Safe, Accessible -Medications previously tried: Geneticist, molecular (cost), Tradjenta (started Trulicity), metformin (GFR < 30) -Reviewed [name] is likely not very effective for DM control with GFR <30, though it should still remain on board for help with HF and CKD -Discussed diet at length - advised to try to keep snacks to < 15 g of carbs, maximize protein/vegetables regularly and avoid large portions of carbs -Discussed importance of Trulicity; she may be able to come off of insulin/glimepiride if Trulicity is maximized, thus reducing risk for hypoglycemia -Advised to move [place] to AM; reduce glimepiride to 1 tablet daily; restart Trulicity  Chronic Kidney Disease Stage 4  -All medications assessed for renal dosing and appropriateness in chronic kidney disease. -Pt has 1 kidney - she donated 1 to her brother -Pt is on [place]. Pt  is not on ACE/ARB due to hx of tongue swelling with lisinopril. -Recommended to continue current medication  GERD (Goal: minimize symptoms of reflux ) -Controlled - per pt report -Current treatment  Pantoprazole 40 mg daily - Appropriate, Effective, Safe,  Accessible Famotidine 20 mg BID -Appropriate, Effective, Safe, Accessible -Medications previously tried: n/a -Hx of Bleeds/ulcers: No -Counseled on small meals, elevating head, and sleeping on left side -Recommended to continue current medication  OAB (Goal: minimize nocturia) -Not ideally controlled - pt has switched from [name] to [place] due to high BP issues; she reports OAB control is about the same, still urinates frequently -she has appt with pelvic PT this week -Current treatment  [name] 70 mg daily - Appropriate, Effective, Safe, Accessible -Medications previously tried: Toviaz, oxybutynin, tolterodine  -Reviewed benefits of PT -Recommend to continue current medication  Neuropathy (Goal: manage symptoms) -Controlled -Current treatment  Gabapentin 300 mg TID - Appropriate, Effective, Safe, Accessible -Medications previously tried: n/a  -Recommended to continue current medication  Breast cancer (Goal: prevent recurrence) -Controlled  -Follows with oncology (Dr Rip Harbour, [facility]). Per oncology, plan for 7-year course of Letrozole, will be completed 2024 -Current treatment  Letrozole 2.5 mg daily - Appropriate, Effective, Safe, Accessible -Medications previously tried: n/a  -Recommended to continue current medication  Patient Goals/Self-Care Activities Patient will:  - take medications as prescribed as evidenced by patient report and record review focus on medication adherence by routine check glucose daily, document, and provide at future appointments check blood pressure daily, document, and provide at future appointments collaborate with provider on medication access solutions       Medication Assistance:  [place] (Cardiomyopathy) 12/25/21 - 12/25/22 ID: [id number] BIN: [id number] PCN: [id number] GROUP: [id number]  Compliance/Adherence/Medication fill history: Care Gaps: Mammogram (due 11/14/20) Eye exam (due 03/31/21) Foot exam (due  06/16/21)  Star-Rating Drugs: Atorvastatin - PDC 94% Farxiga - PDC 75% (LF 01/03/22 x 90 ds) Tradjenta - PDC 100% Glimepiride - PDC 100%  Medication Access: Within the past 30 days, how often has patient missed a dose of medication? 0 Is a pillbox or other method used to improve adherence? Yes  Factors that may affect medication adherence? Confusion over doses prescribed  Are meds synced by current pharmacy? No  Are meds delivered by current pharmacy? No  Does patient experience delays in picking up medications due to transportation concerns? No   [facility] Services Reviewed: Is patient disadvantaged to use [facility]?: No  Current Rx insurance plan: [employer] [place] Name and location of Current pharmacy:  [facility] [address] [place] [address] Phone: [phone] Fax: [phone]  [facility] services reviewed with patient today?: No  Patient requests to transfer care to [facility]?: No  Reason patient declined to change pharmacies: Not mentioned at this visit   Care Plan and Follow Up Patient Decision:  Patient agrees to Care Plan and Follow-up.  Plan: Telephone follow up appointment with care management team member scheduled for:  2 weeks  [name], PharmD, [facility] Clinical Pharmacist [facility] at [facility] [phone]

## 2022-03-19 NOTE — Patient Instructions (Signed)
Visit Information  Phone number for Pharmacist: [phone]   Goals Addressed   None     Care Plan : [place]  Updates made by [name], RPH since 03/19/2022 12:00 AM     Problem: Hypertension, Hyperlipidemia, Diabetes, Heart Failure, Coronary Artery Disease, and Chronic Kidney Disease   Priority: High     Long-Range Goal: Disease Management   Start Date: 01/24/2022  Expected End Date: 02/01/2023  This Visit's Progress: On track  Recent Progress: On track  Priority: High  Note:   Current Barriers:  Unable to maintain control of DM  Pharmacist Clinical Goal(s):  Patient will achieve improvement in DM as evidenced by A1c < 8% through collaboration with PharmD and provider.   Interventions: 1:1 collaboration with Tower, [name], MD regarding development and update of comprehensive plan of care as evidenced by provider attestation and co-signature Inter-disciplinary care team collaboration (see longitudinal plan of care) Comprehensive medication review performed; medication list updated in electronic medical record  Hypertension / Heart Failure (BP goal <140/90) -Improved - BP generally < 140/90 at home, improved with switch from [name] to [place] -Current home readings: 124/64 -Denies hypotensive symptoms, but does report headache when BP is elevated. -Last ejection fraction: 45-50% (Date: 11/30/21) - improved from 15-20% (2015) -HF type: Preserved; NYHA Class: II-III -Current treatment: Carvedilol 12.5 mg - 2 AM, 1 PM - Appropriate, Query Effective Hydralazine 50 mg TID -Appropriate, Query Effective Isosorbide MN 60 mg BID -Appropriate, Query Effective Amlodipine 2.5 mg daily HS -Appropriate, Query Effective Farxiga 5 mg daily (HW) -Appropriate, Query Effective -Medications previously tried: furosemide, HCTZ, losartan, lisinopril (tongue swelling) -Educated on BP goals and benefits of medications for prevention of heart attack, stroke and  kidney damage; -Counseled to monitor BP at home daily for 1-2 weeks -Reviewed finances - pt enrolled in [employer] for Farxiga (Cardiomyopathy) -Recommended to continue current medication; updated med list  Hyperlipidemia: (LDL goal < 70) -Controlled - LDL 46 (06/2020) at goal, due for repeat lipid panel -Hx stroke, NICM -Current treatment: Atorvastatin 80 mg daily - Appropriate, Effective, Safe, Accessible Aspirin 81 mg daily - Appropriate, Effective, Safe, Accessible -Medications previously tried: pravastatin  -Educated on Cholesterol goals;  -Recommended to continue current medication; repeat lipid panel at next OV  Diabetes (A1c goal <8%) -Uncontrolled - A1c 9.8% (02/2022); per Freestyle Libre sugar control is improved; however she has not been taking Trulicity the last couple weeks because she forgot about it -Current home glucose readings: (Libre 3)  Avg sugar: 7-day avg 161 Drops to 72 around 4-5 am most days -Current medications: Farxiga 5 mg daily (HW)- Appropriate, Query Effective (for DM) Glimepiride 1 mg (1-2 tab) daily AM (started ~2012)- Appropriate, Effective, Query Safe Tresiba 16 units HS -Appropriate, Effective, Query Safe Trulicity 1.5 mg weekly - Appropriate, Effective, Safe, Accessible Freestyle Libre 3 w/ reader - Appropriate, Effective, Safe, Accessible -Medications previously tried: Geneticist, molecular (cost), Tradjenta (started Trulicity), metformin (GFR < 30) -Reviewed [name] is likely not very effective for DM control with GFR <30, though it should still remain on board for help with HF and CKD -Discussed diet at length - advised to try to keep snacks to < 15 g of carbs, maximize protein/vegetables regularly and avoid large portions of carbs -Discussed importance of Trulicity; she may be able to come off of insulin/glimepiride if Trulicity is maximized, thus reducing risk for hypoglycemia -Advised to move [place] to AM; reduce glimepiride to 1 tablet daily; restart  Trulicity  Chronic Kidney  Disease Stage 4  -All medications assessed for renal dosing and appropriateness in chronic kidney disease. -Pt has 1 kidney - she donated 1 to her brother -Pt is on [place]. Pt  is not on ACE/ARB due to hx of tongue swelling with lisinopril. -Recommended to continue current medication  GERD (Goal: minimize symptoms of reflux ) -Controlled - per pt report -Current treatment  Pantoprazole 40 mg daily - Appropriate, Effective, Safe, Accessible Famotidine 20 mg BID -Appropriate, Effective, Safe, Accessible -Medications previously tried: n/a -Hx of Bleeds/ulcers: No -Counseled on small meals, elevating head, and sleeping on left side -Recommended to continue current medication  OAB (Goal: minimize nocturia) -Not ideally controlled - pt has switched from [name] to [place] due to high BP issues; she reports OAB control is about the same, still urinates frequently -she has appt with pelvic PT this week -Current treatment  [name] 70 mg daily - Appropriate, Effective, Safe, Accessible -Medications previously tried: Toviaz, oxybutynin, tolterodine  -Reviewed benefits of PT -Recommend to continue current medication  Neuropathy (Goal: manage symptoms) -Controlled -Current treatment  Gabapentin 300 mg TID - Appropriate, Effective, Safe, Accessible -Medications previously tried: n/a  -Recommended to continue current medication  Breast cancer (Goal: prevent recurrence) -Controlled  -Follows with oncology (Dr Rip Harbour, [facility]). Per oncology, plan for 7-year course of Letrozole, will be completed 2024 -Current treatment  Letrozole 2.5 mg daily - Appropriate, Effective, Safe, Accessible -Medications previously tried: n/a  -Recommended to continue current medication  Patient Goals/Self-Care Activities Patient will:  - take medications as prescribed as evidenced by patient report and record review focus on medication adherence by routine check glucose daily,  document, and provide at future appointments check blood pressure daily, document, and provide at future appointments collaborate with provider on medication access solutions      Patient verbalizes understanding of instructions and care plan provided today and agrees to view in [place]. Active MyChart status and patient understanding of how to access instructions and care plan via MyChart confirmed with patient.    Telephone follow up appointment with pharmacy team member scheduled for: 2 weeks  [name], PharmD, [facility] Clinical Pharmacist [place] [facility] at [facility] [phone]

## 2022-03-28 ENCOUNTER — Telehealth: Payer: Self-pay

## 2022-03-28 DIAGNOSIS — Z8673 Personal history of transient ischemic attack (TIA), and cerebral infarction without residual deficits: Secondary | ICD-10-CM | POA: Diagnosis not present

## 2022-03-28 DIAGNOSIS — R29898 Other symptoms and signs involving the musculoskeletal system: Secondary | ICD-10-CM | POA: Diagnosis not present

## 2022-03-28 DIAGNOSIS — M47812 Spondylosis without myelopathy or radiculopathy, cervical region: Secondary | ICD-10-CM | POA: Diagnosis not present

## 2022-03-28 DIAGNOSIS — Z9049 Acquired absence of other specified parts of digestive tract: Secondary | ICD-10-CM | POA: Diagnosis not present

## 2022-03-28 DIAGNOSIS — Z79899 Other long term (current) drug therapy: Secondary | ICD-10-CM | POA: Diagnosis not present

## 2022-03-28 DIAGNOSIS — M5440 Lumbago with sciatica, unspecified side: Secondary | ICD-10-CM | POA: Diagnosis not present

## 2022-03-28 DIAGNOSIS — R2689 Other abnormalities of gait and mobility: Secondary | ICD-10-CM | POA: Diagnosis not present

## 2022-03-28 DIAGNOSIS — M47816 Spondylosis without myelopathy or radiculopathy, lumbar region: Secondary | ICD-10-CM | POA: Diagnosis not present

## 2022-03-28 DIAGNOSIS — M11261 Other chondrocalcinosis, right knee: Secondary | ICD-10-CM | POA: Diagnosis not present

## 2022-03-28 DIAGNOSIS — M4314 Spondylolisthesis, thoracic region: Secondary | ICD-10-CM | POA: Diagnosis not present

## 2022-03-28 DIAGNOSIS — M5030 Other cervical disc degeneration, unspecified cervical region: Secondary | ICD-10-CM | POA: Diagnosis not present

## 2022-03-28 DIAGNOSIS — Z905 Acquired absence of kidney: Secondary | ICD-10-CM | POA: Diagnosis not present

## 2022-03-28 DIAGNOSIS — M5002 Cervical disc disorder with myelopathy, mid-cervical region, unspecified level: Secondary | ICD-10-CM | POA: Diagnosis not present

## 2022-03-28 DIAGNOSIS — M5134 Other intervertebral disc degeneration, thoracic region: Secondary | ICD-10-CM | POA: Diagnosis not present

## 2022-03-28 DIAGNOSIS — M11262 Other chondrocalcinosis, left knee: Secondary | ICD-10-CM | POA: Diagnosis not present

## 2022-03-28 DIAGNOSIS — G8929 Other chronic pain: Secondary | ICD-10-CM | POA: Diagnosis not present

## 2022-03-28 DIAGNOSIS — M5116 Intervertebral disc disorders with radiculopathy, lumbar region: Secondary | ICD-10-CM | POA: Diagnosis not present

## 2022-03-28 DIAGNOSIS — M542 Cervicalgia: Secondary | ICD-10-CM | POA: Diagnosis not present

## 2022-03-28 NOTE — Chronic Care Management (AMB) (Signed)
    Chronic Care Management Pharmacy Assistant   Name: Morgan Hubbard  MRN: [id number] DOB: 1935/05/06   Reason for Encounter: Reminder Call   Medications: Outpatient Encounter Medications as of 03/28/2022  Medication Sig   amLODipine (NORVASC) 2.5 MG tablet Take 2.5 mg by mouth at bedtime.   aspirin EC 81 MG tablet Take 1 tablet (81 mg total) by mouth daily. Swallow whole.   atorvastatin (LIPITOR) 80 MG tablet Take 1 tablet (80 mg total) by mouth daily.   carvedilol (COREG) 12.5 MG tablet Take 1 tablet (12.5 mg total) by mouth in the morning, at noon, and at bedtime. (Patient taking differently: Take 12.5 mg by mouth. 2 in AM, 1 in PM)   cetirizine (ZYRTEC) 10 MG tablet Take 10 mg by mouth daily.   clotrimazole-betamethasone (LOTRISONE) cream Apply 1 application topically as needed (groin).    dapagliflozin propanediol (FARXIGA) 5 MG TABS tablet Take 5 mg by mouth daily.   famotidine (PEPCID) 20 MG tablet Take 1 tablet by mouth twice daily   gabapentin (NEURONTIN) 300 MG capsule Take 1 capsule (300 mg total) by mouth 3 (three) times daily.   glimepiride (AMARYL) 1 MG tablet Take 1-2 tablets (1-2 mg total) by mouth daily with breakfast.   hydrALAZINE (APRESOLINE) 50 MG tablet Take 1 tablet (50 mg total) by mouth 3 (three) times daily.   insulin degludec (TRESIBA) 100 UNIT/ML FlexTouch Pen Inject 16 Units into the skin at bedtime.   isosorbide mononitrate (IMDUR) 60 MG 24 hr tablet Take 1 tablet (60 mg total) by mouth in the morning and at bedtime.   letrozole (FEMARA) 2.5 MG tablet Take 2.5 mg by mouth daily.   nystatin cream (MYCOSTATIN) Apply 1 application topically 2 (two) times daily as needed (irritation.).    pantoprazole (PROTONIX) 40 MG tablet Take 1 tablet by mouth once daily   traMADol (ULTRAM) 50 MG tablet TAKE 1 TO 2 TABLETS BY MOUTH EVERY 8 HOURS AS NEEDED FOR MODERATE TO SEVERE PAIN   TRULICITY 1.5 IN/8.6VE SOPN Inject 1.5 mg into the skin once a week.   [name] ([name]) 75  MG TABS Take 75 mg by mouth daily.   vitamin B-12 (CYANOCOBALAMIN) 1000 MCG tablet Take 1,000 mcg by mouth daily.    Vitamin D, Ergocalciferol, (DRISDOL) 1.25 MG (50000 UT) CAPS capsule Take 50,000 Units by mouth every 7 (seven) days.   No facility-administered encounter medications on file as of 03/28/2022.   Morgan Hubbard was contacted to remind of upcoming telephone visit with [name] on 04/03/22 at 10:00. Patient was reminded to have any blood glucose and blood pressure readings available for review at appointment.   Message was left reminding patient of appointment.  CCM referral has been placed prior to visit?  Yes   Star Rating Drugs: Medication:  Last Fill: Day Supply Atorvastatin '80mg'$  03/25/22 90 Farxiga '5mg'$   03/19/22 30 Glimepiride '1mg'$  01/03/22  90 Tresiba  72/09/47 096 Trulicity  2/83/66 [place], [place]  CPP notified  [name], [place] Assistant [phone]

## 2022-03-30 DIAGNOSIS — E1165 Type 2 diabetes mellitus with hyperglycemia: Secondary | ICD-10-CM | POA: Diagnosis not present

## 2022-03-30 DIAGNOSIS — Z794 Long term (current) use of insulin: Secondary | ICD-10-CM | POA: Diagnosis not present

## 2022-04-01 ENCOUNTER — Encounter: Payer: Self-pay | Admitting: Family Medicine

## 2022-04-01 ENCOUNTER — Telehealth: Payer: Self-pay | Admitting: Pharmacist

## 2022-04-01 NOTE — Telephone Encounter (Signed)
Contacted the patient and rescheduled .  [name], CPP notified  [name], [place]  [phone]

## 2022-04-01 NOTE — Telephone Encounter (Signed)
Received message from patient, she needs to reschedule 11/1 appt.

## 2022-04-02 DIAGNOSIS — E1122 Type 2 diabetes mellitus with diabetic chronic kidney disease: Secondary | ICD-10-CM | POA: Diagnosis not present

## 2022-04-02 DIAGNOSIS — I130 Hypertensive heart and chronic kidney disease with heart failure and stage 1 through stage 4 chronic kidney disease, or unspecified chronic kidney disease: Secondary | ICD-10-CM | POA: Diagnosis not present

## 2022-04-02 DIAGNOSIS — Z7985 Long-term (current) use of injectable non-insulin antidiabetic drugs: Secondary | ICD-10-CM

## 2022-04-02 DIAGNOSIS — E785 Hyperlipidemia, unspecified: Secondary | ICD-10-CM | POA: Diagnosis not present

## 2022-04-02 DIAGNOSIS — I509 Heart failure, unspecified: Secondary | ICD-10-CM

## 2022-04-02 DIAGNOSIS — E1159 Type 2 diabetes mellitus with other circulatory complications: Secondary | ICD-10-CM | POA: Diagnosis not present

## 2022-04-02 DIAGNOSIS — N184 Chronic kidney disease, stage 4 (severe): Secondary | ICD-10-CM | POA: Diagnosis not present

## 2022-04-02 NOTE — Telephone Encounter (Signed)
No recent gabapentin Rx sent will route to PCP for review

## 2022-04-02 NOTE — Telephone Encounter (Signed)
Patient called and stated that pharmacy didn't get the gabapentin.

## 2022-04-03 ENCOUNTER — Telehealth: Payer: Medicare HMO

## 2022-04-03 DIAGNOSIS — I10 Essential (primary) hypertension: Secondary | ICD-10-CM | POA: Diagnosis not present

## 2022-04-03 DIAGNOSIS — E782 Mixed hyperlipidemia: Secondary | ICD-10-CM | POA: Diagnosis not present

## 2022-04-03 DIAGNOSIS — I428 Other cardiomyopathies: Secondary | ICD-10-CM | POA: Diagnosis not present

## 2022-04-03 DIAGNOSIS — Z95810 Presence of automatic (implantable) cardiac defibrillator: Secondary | ICD-10-CM | POA: Diagnosis not present

## 2022-04-03 MED ORDER — GABAPENTIN 300 MG PO CAPS: 300.0000 mg | ORAL_CAPSULE | Freq: Three times a day (TID) | ORAL | 3 refills | Status: DC

## 2022-04-03 NOTE — Telephone Encounter (Signed)
See mychart message, they are requesting Rx for pt also

## 2022-04-04 ENCOUNTER — Ambulatory Visit: Payer: Medicare HMO | Admitting: Podiatry

## 2022-04-04 ENCOUNTER — Ambulatory Visit: Payer: Medicare HMO | Admitting: Pharmacist

## 2022-04-04 ENCOUNTER — Encounter: Payer: Self-pay | Admitting: Podiatry

## 2022-04-04 VITALS — BP 153/75 | HR 83

## 2022-04-04 DIAGNOSIS — N289 Disorder of kidney and ureter, unspecified: Secondary | ICD-10-CM | POA: Diagnosis not present

## 2022-04-04 DIAGNOSIS — E1142 Type 2 diabetes mellitus with diabetic polyneuropathy: Secondary | ICD-10-CM

## 2022-04-04 DIAGNOSIS — B351 Tinea unguium: Secondary | ICD-10-CM

## 2022-04-04 DIAGNOSIS — I428 Other cardiomyopathies: Secondary | ICD-10-CM

## 2022-04-04 DIAGNOSIS — M79674 Pain in right toe(s): Secondary | ICD-10-CM

## 2022-04-04 DIAGNOSIS — M79675 Pain in left toe(s): Secondary | ICD-10-CM

## 2022-04-04 DIAGNOSIS — E1129 Type 2 diabetes mellitus with other diabetic kidney complication: Secondary | ICD-10-CM

## 2022-04-04 DIAGNOSIS — E1169 Type 2 diabetes mellitus with other specified complication: Secondary | ICD-10-CM

## 2022-04-04 DIAGNOSIS — I10 Essential (primary) hypertension: Secondary | ICD-10-CM

## 2022-04-04 DIAGNOSIS — N184 Chronic kidney disease, stage 4 (severe): Secondary | ICD-10-CM

## 2022-04-04 NOTE — Patient Instructions (Signed)
Visit Information  Phone number for Pharmacist: [phone]   Goals Addressed   None     Care Plan : [place]  Updates made by [name], RPH since 04/04/2022 12:00 AM     Problem: Hypertension, Hyperlipidemia, Diabetes, Heart Failure, Coronary Artery Disease, and Chronic Kidney Disease   Priority: High     Long-Range Goal: Disease Management   Start Date: 01/24/2022  Expected End Date: 02/01/2023  This Visit's Progress: On track  Recent Progress: On track  Priority: High  Note:   Current Barriers:  Unable to maintain control of DM  Pharmacist Clinical Goal(s):  Patient will achieve improvement in DM as evidenced by A1c < 8% through collaboration with PharmD and provider.   Interventions: 1:1 collaboration with Tower, [name], MD regarding development and update of comprehensive plan of care as evidenced by provider attestation and co-signature Inter-disciplinary care team collaboration (see longitudinal plan of care) Comprehensive medication review performed; medication list updated in electronic medical record  Hypertension / Heart Failure (BP goal <140/90) -Controlled- BP generally < 140/90 at home, improved with switch from [name] to [place]; she recently had mild hypotension in office visit at [facility] but improved with 16 oz of water -Current home readings: 124/64 -Denies hypotensive symptoms, but does report headache when BP is elevated. -Last ejection fraction: 45-50% (Date: 11/30/21) - improved from 15-20% (2015) -HF type: HFimpEF; NYHA Class: II-III -Current treatment: Carvedilol 12.5 mg - 2 AM, 1 PM - Appropriate, Query Effective Hydralazine 50 mg TID -Appropriate, Query Effective Isosorbide MN 60 mg BID -Appropriate, Query Effective Amlodipine 2.5 mg daily HS -Appropriate, Query Effective Farxiga 5 mg daily (HW) -Appropriate, Query Effective -Medications previously tried: furosemide, HCTZ, losartan, lisinopril (tongue  swelling) -Educated on BP goals and benefits of medications for prevention of heart attack, stroke and kidney damage; -Counseled to monitor BP at home daily for 1-2 weeks -Reviewed finances - pt enrolled in [employer] for Farxiga (Cardiomyopathy) -Recommended to continue current medication  Hyperlipidemia: (LDL goal < 70) -Controlled - LDL 46 (06/2020) at goal, due for repeat lipid panel -Hx stroke, NICM -Current treatment: Atorvastatin 80 mg daily - Appropriate, Effective, Safe, Accessible Aspirin 81 mg daily - Appropriate, Effective, Safe, Accessible -Medications previously tried: pravastatin  -Educated on Cholesterol goals;  -Recommended to continue current medication; repeat lipid panel at next OV  Diabetes (A1c goal <8%) -Uncontrolled, improving- A1c 9.8% (02/2022); per Freestyle Libre sugar control is improved; she reports some low readings in early AM (4-5am) but frequency has improved since moving [place] to AM -Current home glucose readings: (Libre 3): TIR 87%; high 13%, low 0% -Current medications: Farxiga 5 mg daily (HW)- Appropriate, Query Effective (for DM) Glimepiride 1 mg daily AM (started ~2012)- Appropriate, Effective, Query Safe Tresiba 16 units AM -Appropriate, Effective, Query Safe Trulicity 1.5 mg weekly - Appropriate, Effective, Safe, Accessible Freestyle Libre 3 w/ reader - Appropriate, Effective, Safe, Accessible -Medications previously tried: Geneticist, molecular (cost), Tradjenta (started Trulicity), metformin (GFR < 30) -Reviewed [name] is likely not very effective for DM control with GFR <30, though it should still remain on board for help with HF and CKD -Discussed diet at length - advised to try to keep snacks to < 15 g of carbs, maximize protein/vegetables regularly and avoid large portions of carbs -Reviewed low sugar issues - she has been on glimepiride for over 10 years so efficacy is questionable anyway, recommend to hold glimepiride  Chronic Kidney Disease  Stage 4  -All medications assessed for  renal dosing and appropriateness in chronic kidney disease. -Pt has 1 kidney - she donated 1 to her brother -Pt is on [place]. Pt  is not on ACE/ARB due to hx of tongue swelling with lisinopril. -Recommended to continue current medication  GERD (Goal: minimize symptoms of reflux ) -Controlled - per pt report -Current treatment  Pantoprazole 40 mg daily - Appropriate, Effective, Safe, Accessible Famotidine 20 mg BID -Appropriate, Effective, Safe, Accessible -Medications previously tried: n/a -Hx of Bleeds/ulcers: No -Counseled on small meals, elevating head, and sleeping on left side -Recommended to continue current medication  OAB (Goal: minimize nocturia) -Not ideally controlled - pt has switched from [name] to [place] due to high BP issues; she reports OAB control is about the same, still urinates frequently -she has appt with pelvic PT this week -Current treatment  [name] 70 mg daily - Appropriate, Effective, Safe, Accessible -Medications previously tried: Toviaz, oxybutynin, tolterodine  -Reviewed benefits of PT -Recommend to continue current medication  Neuropathy (Goal: manage symptoms) -Controlled - pt has been out of gabapentin for a while and neuropathy was worse, but she has refill now -Current treatment  Gabapentin 300 mg TID - Appropriate, Effective, Safe, Accessible -Medications previously tried: n/a  -Recommended to continue current medication  Breast cancer (Goal: prevent recurrence) -Controlled  -Follows with oncology (Dr Rip Harbour, [facility]). Per oncology, plan for 7-year course of Letrozole, will be completed 2024 -Current treatment  Letrozole 2.5 mg daily - Appropriate, Effective, Safe, Accessible -Medications previously tried: n/a  -Recommended to continue current medication  Patient Goals/Self-Care Activities Patient will:  - take medications as prescribed as evidenced by patient report and record review focus on  medication adherence by routine check glucose daily, document, and provide at future appointments check blood pressure daily, document, and provide at future appointments collaborate with provider on medication access solutions      Patient verbalizes understanding of instructions and care plan provided today and agrees to view in [place]. Active MyChart status and patient understanding of how to access instructions and care plan via MyChart confirmed with patient.    Telephone follow up appointment with pharmacy team member scheduled for: 1 month  [name], PharmD, [facility] Clinical Pharmacist [place] [facility] at [facility] [phone]

## 2022-04-04 NOTE — Progress Notes (Signed)
Chronic Care Management Pharmacy Note  04/04/22 Name:  Morgan Hubbard MRN:  [id number] DOB:  [birth year]  Summary: CCM F/U visit -DM: A1c 9.8% (02/2022), she is wearing Mickie Kay and reports Time-in-range is 87% with 0% lows, however she does report she has indeed been having some lows in the 60s 4-5 am; this has improved since moving [place] to AM but still does occur -HTN: Pt reports BP generally < 140/90, improved with switch from [name] to [place] and addition of amlodipine. OAB control is similar.  Recommendations/Changes made from today's visit: -Advised to hold glimepiride; contacted endocrine to inform them - f/u appt in Dec  Plan: -Pharmacist follow up televisit scheduled for 1 month     Subjective: Morgan Hubbard is an 87 y.o. year old female who is a primary patient of Tower, [name], MD.  The CCM team was consulted for assistance with disease management and care coordination needs.    Engaged with patient by telephone for follow up visit in response to provider referral for pharmacy case management and/or care coordination services.   Consent to Services:  The patient was given information about Chronic Care Management services, agreed to services, and gave verbal consent prior to initiation of services.  Please see initial visit note for detailed documentation.   Patient Care Team: Tower, [name], MD as PCP - General (Family Medicine) Carolan Clines, MD (Inactive) as Attending Physician (Urology) Minna Merritts, MD as Consulting Physician (Cardiology) Daryll Brod, MD as Referring Physician (Cardiology) Oneta Rack, MD as Consulting Physician (Dermatology) Ward, Jeani Hawking, MD as Referring Physician (Internal Medicine) Wynona Canes, MD as Referring Physician (Neurology) Kimmick, Linus Mako, MD as Referring Physician (Oncology) Gabriel Carina Betsey Holiday, MD as Physician Assistant (Internal Medicine) [name], MD as Consulting Physician  (Family Medicine) Alvester Chou., DDS as Consulting Physician (Dentistry) Birder Robson, MD as Referring Physician (Ophthalmology) [name], [facility] as Pharmacist (Pharmacist)  Recent office visits: 01/04/22-[name],MD(PCP)- right ear fullness(New-farxiga- very $$ but does not hurt the kidney)Gfr of 27.85-K is back in nl range -impaction removed with simple ear irrigation and tolerated well   12/17/21-[name],MD(PCP)-f/u AKI,Labs,May need insulin in the future ,start PT,increase fluid intake.(Potassium is back into the normal range,Kidney numbers are stable to slightly improved ) 12/07/21-Graham Duncan,MD(fam med)-hospital f/u Hold metformin for now.  Continue linagliptin.  I would try restarting glimepiride 4m with breakfast.  If sugar is still above 200 after 1 week, then increase to 2 mg with breakfast. Would stay off losartan and hydrochlorothiazide for now.Labs ordered  11/28/21-[name],MD(PCP)-f/u hospital visit,Renal labs today (Worse kidney function - was sent to ER today with malaise and bp issues and dehydration ) If improved will start back losartan hctz - [facility] 11/05/21-[name],MD(PCP)-GI upset,labs(Potassium is mildly high,stable kidney numbers ,Liver tests are stable but protein levels are low Other labs are ok -Adding pepcid 20 mg twice daily  09/19/21-Spencer Copland,MD(fam med)- acute left shoulder pain,rest 2 weeks, no medication changes 07/16/21-Spencer Copland,MD(fam med)-right shoulder pain,xrays,injection with kenalog and lidocaine.  Recent consult visits: 04/03/22 [facility]tha [facility] mild hypotension in clinic improved with 16 oz water. Continue current meds.   03/28/22 NP MElliot Dally([facility] musclar decondition - pt will avoid surgery. Restart PT.  02/12/22 Dr SGabriel Carina(Endocrine): Stop Tradjenta. Start Trulicity 1.5 mg. 89/7/35Dr SGabriel Carina(Endocrine):  increase TTyler Aasto 16 units at bedtime, reduce glimepiride to  14mtake 2 tablets in am, restart farxiga 30m56make  1 tablet morning.She has been working with primary care provider and Cardiology on adjusting her BP medications. No longer taking losartan. F/u 4-6 weeks  12/25/21-Timothy Gollan,MD(cardio)-f/u heart disease,EKG, imaging reviewed,Recommend she increase Hydrlazine 50 TID,Recommend she increase Coreg 12.5 TID,monitor BP daily,f/u 6 months 12/17/21-Anna Solum,MD(endo)-Telephone call to report BG's running high 11/06/21-Anna Solum,MD-(endo)-f/u DM,Stop glimepiride, replace with Tradjenta 5 mg daliy. F/u 3 months w labs 08/01/21-Cary Ward,MD(cardio)-f/u, start some PT,ride stationary bike,no medication changes, f/u 6 months 07/27/21-Anna Solum,MD(endo)-f/u DM,Add Trulicity 6.38 mg weekly.f/u 3 months   Hospital visits: 11/29/21 - 11/30/21 Admission [facility]): hypotension. Given IVF. Resume carvedilol, hold losartan/hctz, hold hydralazine, imdur  11/19/21 - 11/24/21 Admission ([facility]): Failure to thrive, AKI. Increased coreg to 25 mg BID. D/C losartan/hctz, d/c plavix (no need for DAPT). Held metformin. Removed Farxiga (cost)   Objective:  Lab Results  Component Value Date   CREATININE 1.67 (H) 01/01/2022   BUN 30 (H) 01/01/2022   GFR 27.85 (L) 01/01/2022   GFRNONAA 28 (L) 11/30/2021   GFRAA 41 (L) 06/15/2018   NA 140 01/01/2022   K 5.1 01/01/2022   CALCIUM 8.8 01/01/2022   CO2 31 01/01/2022   GLUCOSE 170 (H) 01/01/2022    Lab Results  Component Value Date/Time   HGBA1C 8.0 (H) 11/29/2021 04:16 PM   HGBA1C endo 01/03/2019 12:00 AM   HGBA1C 7.7 (H) 05/02/2017 09:40 AM   GFR 27.85 (L) 01/01/2022 10:29 AM   GFR 34.72 (L) 12/17/2021 12:44 PM   MICROALBUR 78.0 (H) 04/22/2016 02:55 PM    Last diabetic Eye exam:  Lab Results  Component Value Date/Time   HMDIABEYEEXA No Retinopathy 03/31/2020 12:00 AM    Last diabetic Foot exam: No results found for: "HMDIABFOOTEX"   Lab Results  Component Value Date   CHOL 99 06/12/2020   HDL 33.20 (L) 06/12/2020    LDLCALC 46 06/10/2019   LDLDIRECT 46.0 06/12/2020   TRIG 209.0 (H) 06/12/2020   CHOLHDL 3 06/12/2020       Latest Ref Rng & Units 11/29/2021   12:53 PM 11/28/2021    3:37 PM 11/05/2021   11:17 AM  Hepatic Function  Total Protein 6.5 - 8.1 g/dL 5.3  5.4  5.5   Albumin 3.5 - 5.0 g/dL 2.8  3.4  3.3   AST 15 - 41 U/L _0 ALT 0 - 44 U/L _1 Alk Phosphatase 38 - 126 U/L 69  66  55   Total Bilirubin 0.3 - 1.2 mg/dL 1.0  0.4  0.6   Bilirubin, Direct 0.0 - 0.2 mg/dL 0.1   0.1     Lab Results  Component Value Date/Time   TSH 2.34 11/28/2021 03:37 PM   TSH 2.62 06/16/2020 11:18 AM   FREET4 1.46 06/16/2020 11:18 AM       Latest Ref Rng & Units 12/07/2021    1:37 PM 11/30/2021    2:22 AM 11/29/2021   12:53 PM  CBC  WBC 3.8 - 10.8 Thousand/uL 6.4  6.2  7.4   Hemoglobin 11.7 - 15.5 g/dL 11.0  10.4  10.6   Hematocrit 35.0 - 45.0 % 34.4  31.6  32.5   Platelets 140 - 400 Thousand/uL 179  177  185     No results found for: "VD25OH"  Clinical ASCVD: Yes  The ASCVD Risk score (Arnett DK, et al., 2019) failed to calculate for the following reasons:   The 2019 ASCVD risk score is only valid for ages  40 to 62   The patient has a prior MI or stroke diagnosis       11/28/2021    3:03 PM 11/05/2021   10:32 AM 05/30/2021    1:21 PM  Depression screen PHQ 2/9  Decreased Interest 0 0 0  Down, Depressed, Hopeless 0 0 0  PHQ - 2 Score 0 0 0     Social History   Tobacco Use  Smoking Status Never  Smokeless Tobacco Never   BP Readings from Last 3 Encounters:  03/19/22 124/64  01/04/22 (!) 144/58  12/25/21 (!) 166/70   Pulse Readings from Last 3 Encounters:  01/04/22 69  12/25/21 63  12/17/21 71   Wt Readings from Last 3 Encounters:  01/04/22 141 lb 3.2 oz (64 kg)  12/25/21 142 lb 6 oz (64.6 kg)  12/17/21 140 lb 12.8 oz (63.9 kg)   BMI Readings from Last 3 Encounters:  01/04/22 26.68 kg/m  12/25/21 26.90 kg/m  12/17/21 26.60 kg/m     Assessment/Interventions: Review of patient past medical history, allergies, medications, health status, including review of consultants reports, laboratory and other test data, was performed as part of comprehensive evaluation and provision of chronic care management services.   SDOH:  (Social Determinants of Health) assessments and interventions performed: No - done 05/2021 SDOH Interventions    Flowsheet Row Chronic Care Management from 12/22/2020 in [facility] at [place] from 06/10/2019 in [facility] at [place] Interventions    Depression Interventions/Treatment  -- ZMO2-9 Score <4 Follow-up Not Indicated  Financial Strain Interventions Intervention Not Indicated --      [place]: No Food Insecurity (05/30/2021)  Housing: Low Risk  (05/30/2021)  Transportation Needs: No Transportation Needs (05/30/2021)  Alcohol Screen: Low Risk  (05/30/2021)  Depression (PHQ2-9): Low Risk  (11/28/2021)  Financial Resource Strain: Low Risk  (05/30/2021)  Physical Activity: Inactive (05/30/2021)  Social Connections: Moderately Isolated (05/30/2021)  Stress: No Stress Concern Present (05/30/2021)  Tobacco Use: Low Risk  (01/04/2022)    CCM Care Plan  Allergies  Allergen Reactions   Ace Inhibitors Swelling    REACTION: tongue swelling   Lisinopril Swelling    Swelling of tongue   Insulin Detemir Itching   Nsaids Other (See Comments)    Renal issues   Codeine Nausea Only   Hydrochlorothiazide Other (See Comments)    Dizziness/funny feeling   Tylenol [Acetaminophen] Other (See Comments)    Fatty liver - use with caution    Medications Reviewed Today     Reviewed by [name], [facility] (Pharmacist) on 04/04/22 at 1205  Med List Status: <None>   Medication Order Taking? Sig Documenting Provider Last Dose Status Informant  amLODipine (NORVASC) 2.5 MG tablet [id number] Yes Take 2.5 mg by mouth at bedtime.  Justin Mend, MD Taking Active   aspirin EC 81 MG tablet [id number] Yes Take 1 tablet (81 mg total) by mouth daily. Swallow whole. Minna Merritts, MD Taking Active Multiple Informants  atorvastatin (LIPITOR) 80 MG tablet [id number] Yes Take 1 tablet (80 mg total) by mouth daily. Minna Merritts, MD Taking Active   carvedilol (COREG) 12.5 MG tablet [id number] Yes Take 1 tablet (12.5 mg total) by mouth in the morning, at noon, and at bedtime.  Patient taking differently: Take 12.5 mg by mouth. 2 in AM, 1 in PM   [place], Kathlene November, MD Taking Active   cetirizine (ZYRTEC) 10 MG tablet [id number] Yes Take  10 mg by mouth daily. [provider] Taking Active Multiple Informants  clotrimazole-betamethasone (LOTRISONE) cream [id number] Yes Apply 1 application topically as needed (groin).  [provider] Taking Active Multiple Informants           Med Note Ebony Hail, AMBER B   Wed Jun 10, 2018  5:40 PM)    dapagliflozin propanediol (FARXIGA) 5 MG TABS tablet [id number] Yes Take 5 mg by mouth daily. [provider] Taking Active   famotidine (PEPCID) 20 MG tablet [id number] Yes Take 1 tablet by mouth twice daily Tower, [name], MD Taking Active   gabapentin (NEURONTIN) 300 MG capsule [id number] Yes Take 1 capsule (300 mg total) by mouth 3 (three) times daily. Tower, [name], MD Taking Active   glimepiride (AMARYL) 1 MG tablet [id number] Yes Take 1-2 tablets (1-2 mg total) by mouth daily with breakfast. Tonia Ghent, MD Taking Active   hydrALAZINE (APRESOLINE) 50 MG tablet [id number] Yes Take 1 tablet (50 mg total) by mouth 3 (three) times daily. Minna Merritts, MD Taking Active   insulin degludec (TRESIBA) 100 UNIT/ML FlexTouch Pen [id number] Yes Inject 16 Units into the skin at bedtime. [provider] Taking Active   isosorbide mononitrate (IMDUR) 60 MG 24 hr tablet [id number] Yes Take 1 tablet (60 mg total) by mouth in the morning and at bedtime. Minna Merritts, MD  Taking Active   letrozole [facility]) 2.5 MG tablet [id number] Yes Take 2.5 mg by mouth daily. [provider] Taking Active Multiple Informants  nystatin cream (MYCOSTATIN) [id number] Yes Apply 1 application topically 2 (two) times daily as needed (irritation.).  [provider] Taking Active Multiple Informants  pantoprazole (PROTONIX) 40 MG tablet [id number] Yes Take 1 tablet by mouth once daily Tower, [name], MD Taking Active Multiple Informants  traMADol (ULTRAM) 50 MG tablet [id number] Yes TAKE 1 TO 2 TABLETS BY MOUTH EVERY 8 HOURS AS NEEDED FOR MODERATE TO SEVERE PAIN Tower, [name], MD Taking Active   TRULICITY 1.5 VE/9.3YB SOPN [id number] Yes Inject 1.5 mg into the skin once a week. Judi Cong, MD Taking Active   [name] [facility]) 75 MG TABS [id number] Yes Take 75 mg by mouth daily. Tower, [name], MD Taking Active   vitamin B-12 (CYANOCOBALAMIN) 1000 MCG tablet [id number] Yes Take 1,000 mcg by mouth daily.  [provider] Taking Active Multiple Informants  Vitamin D, Ergocalciferol, (DRISDOL) 1.25 MG (50000 UT) CAPS capsule [id number] Yes Take 50,000 Units by mouth every 7 (seven) days. [provider] Taking Active Multiple Informants           Med Note Geradine Girt Dec 07, 2021 12:51 PM)              Patient Active Problem List   Diagnosis Date Noted   Near syncope 11/29/2021   Hypotension 11/29/2021   Generalized weakness 11/28/2021   Encounter for medication review 11/28/2021   Nausea 11/05/2021   Shoulder pain 04/25/2021   History of breast cancer 06/18/2020   Anemia 06/16/2020   Elevated TSH 06/16/2020   Occipital neuralgia 02/14/2020   Hair loss 11/09/2018   Leg weakness, bilateral 06/29/2018   Pedal edema 06/22/2018   AICD (automatic cardioverter/defibrillator) present 06/11/2018   GERD (gastroesophageal reflux disease) 06/10/2018   AKI (acute kidney injury) ([place]) 06/10/2018   Cholecystitis 06/10/2018   Tingling 10/06/2017    Chronic back pain 95/02/3266   Chronic systolic (congestive) heart failure ([place]) 02/25/2017   Osteopenia 06/16/2016  Routine general medical examination at a health care facility 04/24/2016   Estrogen deficiency 04/24/2016   Urticaria 11/03/2015   Cerumen impaction 03/10/2015   Encounter for Medicare annual wellness exam 06/13/2014   Stroke, small vessel ([place]) 01/18/2014   Family history of hemochromatosis 01/18/2014   Cerebral thrombosis with cerebral infarction ([place]) 01/14/2014   Expressive aphasia 01/13/2014   Nonischemic cardiomyopathy ([place]) 04/05/2013   Shortness of breath 09/03/2012   Elevated transaminase level 05/18/2012   Spinal stenosis of lumbar region 12/10/2011   Mixed incontinence urge and stress 12/10/2011   Renal insufficiency 11/04/2011   Goiter 01/14/2011   Fatty liver 08/28/2010   Type II diabetes mellitus with renal manifestations ([place]) 07/09/2010   Hyperlipidemia associated with type 2 diabetes mellitus ([place]) 07/09/2010   Hyperkalemia 07/09/2010   REFLEX SYMPATHETIC DYSTROPHY 07/09/2010   Neuropathy 07/09/2010   Essential hypertension 07/09/2010   CARDIOMYOPATHY 07/09/2010   OVERACTIVE BLADDER 07/09/2010    Immunization History  Administered Date(s) Administered   Fluad Quad(high Dose 65+) 02/14/2020, 02/15/2021   Influenza,inj,Quad PF,6+ Mos 05/24/2015, 04/03/2016, 03/09/2018   Influenza-Unspecified 03/29/2013, 04/11/2014, 04/03/2016, 03/17/2017   PFIZER(Purple Top)SARS-COV-2 Vaccination 07/16/2019, 08/06/2019, 03/03/2020   Pneumococcal Conjugate-13 06/13/2014   Pneumococcal Polysaccharide-23 01/14/2011    Conditions to be addressed/monitored:  Hypertension, Hyperlipidemia, Diabetes, Heart Failure, Coronary Artery Disease, and Chronic Kidney Disease  Care Plan : CCM Pharmacy Care Plan  Updates made by [name], [place] since 04/04/2022 12:00 AM     Problem: Hypertension, Hyperlipidemia, Diabetes, Heart Failure, Coronary Artery Disease,  and Chronic Kidney Disease   Priority: High     Long-Range Goal: Disease Management   Start Date: 01/24/2022  Expected End Date: 02/01/2023  This Visit's Progress: On track  Recent Progress: On track  Priority: High  Note:   Current Barriers:  Unable to maintain control of DM  Pharmacist Clinical Goal(s):  Patient will achieve improvement in DM as evidenced by A1c < 8% through collaboration with PharmD and provider.   Interventions: 1:1 collaboration with Tower, [name], MD regarding development and update of comprehensive plan of care as evidenced by provider attestation and co-signature Inter-disciplinary care team collaboration (see longitudinal plan of care) Comprehensive medication review performed; medication list updated in electronic medical record  Hypertension / Heart Failure (BP goal <140/90) -Controlled- BP generally < 140/90 at home, improved with switch from [name] to [place]; she recently had mild hypotension in office visit at [facility] but improved with 16 oz of water -Current home readings: 124/64 -Denies hypotensive symptoms, but does report headache when BP is elevated. -Last ejection fraction: 45-50% (Date: 11/30/21) - improved from 15-20% (2015) -HF type: HFimpEF; NYHA Class: II-III -Current treatment: Carvedilol 12.5 mg - 2 AM, 1 PM - Appropriate, Query Effective Hydralazine 50 mg TID -Appropriate, Query Effective Isosorbide MN 60 mg BID -Appropriate, Query Effective Amlodipine 2.5 mg daily HS -Appropriate, Query Effective Farxiga 5 mg daily (HW) -Appropriate, Query Effective -Medications previously tried: furosemide, HCTZ, losartan, lisinopril (tongue swelling) -Educated on BP goals and benefits of medications for prevention of heart attack, stroke and kidney damage; -Counseled to monitor BP at home daily for 1-2 weeks -Reviewed finances - pt enrolled in [employer] for [place] (Cardiomyopathy) -Recommended to continue current  medication  Hyperlipidemia: (LDL goal < 70) -Controlled - LDL 46 (06/2020) at goal, due for repeat lipid panel -Hx stroke, NICM -Current treatment: Atorvastatin 80 mg daily - Appropriate, Effective, Safe, Accessible Aspirin 81 mg daily - Appropriate, Effective, Safe, Accessible -Medications previously tried: pravastatin  -  Educated on Cholesterol goals;  -Recommended to continue current medication; repeat lipid panel at next OV  Diabetes (A1c goal <8%) -Uncontrolled, improving- A1c 9.8% (02/2022); per Freestyle Libre sugar control is improved; she reports some low readings in early AM (4-5am) but frequency has improved since moving [place] to AM -Current home glucose readings: (Libre 3): TIR 87%; high 13%, low 0% -Current medications: Farxiga 5 mg daily (HW)- Appropriate, Query Effective (for DM) Glimepiride 1 mg daily AM (started ~2012)- Appropriate, Effective, Query Safe Tresiba 16 units AM -Appropriate, Effective, Query Safe Trulicity 1.5 mg weekly - Appropriate, Effective, Safe, Accessible Freestyle Libre 3 w/ reader - Appropriate, Effective, Safe, Accessible -Medications previously tried: Geneticist, molecular (cost), Tradjenta (started Trulicity), metformin (GFR < 30) -Reviewed [name] is likely not very effective for DM control with GFR <30, though it should still remain on board for help with HF and CKD -Discussed diet at length - advised to try to keep snacks to < 15 g of carbs, maximize protein/vegetables regularly and avoid large portions of carbs -Reviewed low sugar issues - she has been on glimepiride for over 10 years so efficacy is questionable anyway, recommend to hold glimepiride  Chronic Kidney Disease Stage 4  -All medications assessed for renal dosing and appropriateness in chronic kidney disease. -Pt has 1 kidney - she donated 1 to her brother -Pt is on [place]. Pt  is not on ACE/ARB due to hx of tongue swelling with lisinopril. -Recommended to continue current medication  GERD  (Goal: minimize symptoms of reflux ) -Controlled - per pt report -Current treatment  Pantoprazole 40 mg daily - Appropriate, Effective, Safe, Accessible Famotidine 20 mg BID -Appropriate, Effective, Safe, Accessible -Medications previously tried: n/a -Hx of Bleeds/ulcers: No -Counseled on small meals, elevating head, and sleeping on left side -Recommended to continue current medication  OAB (Goal: minimize nocturia) -Not ideally controlled - pt has switched from [name] to [place] due to high BP issues; she reports OAB control is about the same, still urinates frequently -she has appt with pelvic PT this week -Current treatment  [name] 70 mg daily - Appropriate, Effective, Safe, Accessible -Medications previously tried: Toviaz, oxybutynin, tolterodine  -Reviewed benefits of PT -Recommend to continue current medication  Neuropathy (Goal: manage symptoms) -Controlled - pt has been out of gabapentin for a while and neuropathy was worse, but she has refill now -Current treatment  Gabapentin 300 mg TID - Appropriate, Effective, Safe, Accessible -Medications previously tried: n/a  -Recommended to continue current medication  Breast cancer (Goal: prevent recurrence) -Controlled  -Follows with oncology (Dr Rip Harbour, [facility]). Per oncology, plan for 7-year course of Letrozole, will be completed 2024 -Current treatment  Letrozole 2.5 mg daily - Appropriate, Effective, Safe, Accessible -Medications previously tried: n/a  -Recommended to continue current medication  Patient Goals/Self-Care Activities Patient will:  - take medications as prescribed as evidenced by patient report and record review focus on medication adherence by routine check glucose daily, document, and provide at future appointments check blood pressure daily, document, and provide at future appointments collaborate with provider on medication access solutions     Medication Assistance:  [place]  (Cardiomyopathy) 12/25/21 - 12/25/22 ID: [id number] BIN: [id number] PCN: [id number] GROUP: [id number]  Compliance/Adherence/Medication fill history: Care Gaps: Mammogram (due 11/14/20) Eye exam (due 03/31/21) Foot exam (due 06/16/21)  Star-Rating Drugs: Atorvastatin - PDC 94% Farxiga - PDC 75% (LF 01/03/22 x 90 ds) Tradjenta - PDC 100% Glimepiride - PDC 100%  Medication Access: Within the past 30 days, how  often has patient missed a dose of medication? 0 Is a pillbox or other method used to improve adherence? Yes  Factors that may affect medication adherence? Confusion over doses prescribed Are meds synced by current pharmacy? No  Are meds delivered by current pharmacy? No  Does patient experience delays in picking up medications due to transportation concerns? No   [facility] Services Reviewed: Is patient disadvantaged to use [facility]?: No  Current Rx insurance plan: [employer] [place] Name and location of Current pharmacy:  [facility] [address] [place] - [place] [address] Phone: [phone] Fax: [phone]  [facility] services reviewed with patient today?: No  Patient requests to transfer care to [facility]?: No  Reason patient declined to change pharmacies: Not mentioned at this visit   Care Plan and Follow Up Patient Decision:  Patient agrees to Care Plan and Follow-up.  Plan: Telephone follow up appointment with care management team member scheduled for:  4 weeks  [name], PharmD, BCACP Clinical Pharmacist [facility] at [facility] [phone]

## 2022-04-04 NOTE — Progress Notes (Signed)
This patient presents to the office with chief complaint of long thick nails and diabetic feet.  This patient  says there  is  no pain and discomfort in her feet.  This patient says there are long thick painful nails.  These nails are painful walking and wearing shoes.  Patient has no history of infection or drainage from both feet.  Patient is unable to  self treat his own nails . This patient presents  to the office today for treatment of the  long nails and a foot evaluation due to history of  diabetes.  General Appearance  Alert, conversant and in no acute stress.  Vascular  Dorsalis pedis and posterior tibial  pulses are palpable  bilaterally.  Capillary return is within normal limits  bilaterally. Temperature is within normal limits  bilaterally.  Neurologic  Senn-Weinstein monofilament wire test within normal limits  bilaterally. Muscle power within normal limits bilaterally.  Nails Thick disfigured discolored nails with subungual debris  from hallux to second toenails  No evidence of bacterial infection or drainage bilaterally.  Orthopedic  No limitations of motion of motion feet .  No crepitus or effusions noted.  No bony pathology or digital deformities noted. Midfoot exostosis  B/L.  Hallix malleus left foot.  Hammer toe second right  Skin  normotropic skin with no porokeratosis noted bilaterally.  No signs of infections or ulcers noted.     Onychomycosis  Diabetes with no foot complications  IE  Debride nails x 10.  A diabetic foot exam was performed and there is no evidence of any vascular or neurologic pathology.   RTC 4  months.  Patient is concerned about her neuropathy during visit.   Gardiner Barefoot DPM

## 2022-04-16 NOTE — Telephone Encounter (Signed)
I spoke with pts husband and Morgan Hubbard; Morgan Hubbard has on and off dull pain from rt side of back of neck that radiates into top of rt side of head.Morgan Hubbard said pain level varies from not too bad to intense pain. Morgan Hubbard said started about 2 wks ago and Mr Deboy thought been going on longer like 4- 5 wks. Morgan Hubbard has not noticed any weakness in rt side and Morgan Hubbard can use rt hand normally. No dizziness noted. Morgan Hubbard was seen at [facility] 03/28/2022 and Morgan Hubbard said she did not have to go back.. Morgan Hubbard said she has not had the pain in the last 2 days. Morgan Hubbard scheduled appt with Dr [name] on 04/17/22 at 12 noon with UC & ED precautions and Morgan Hubbard voiced understanding and appreciative for Dr [name] to see her. Sending note to Dr [name].

## 2022-04-17 ENCOUNTER — Encounter: Payer: Self-pay | Admitting: Family Medicine

## 2022-04-17 ENCOUNTER — Ambulatory Visit (INDEPENDENT_AMBULATORY_CARE_PROVIDER_SITE_OTHER): Payer: Medicare HMO | Admitting: Family Medicine

## 2022-04-17 VITALS — BP 118/58 | HR 82 | Temp 98.5°F | Ht 61.0 in | Wt 143.0 lb

## 2022-04-17 DIAGNOSIS — M5481 Occipital neuralgia: Secondary | ICD-10-CM | POA: Diagnosis not present

## 2022-04-17 DIAGNOSIS — I10 Essential (primary) hypertension: Secondary | ICD-10-CM | POA: Diagnosis not present

## 2022-04-17 DIAGNOSIS — J069 Acute upper respiratory infection, unspecified: Secondary | ICD-10-CM | POA: Diagnosis not present

## 2022-04-17 DIAGNOSIS — G4486 Cervicogenic headache: Secondary | ICD-10-CM

## 2022-04-17 DIAGNOSIS — R519 Headache, unspecified: Secondary | ICD-10-CM | POA: Insufficient documentation

## 2022-04-17 LAB — POC COVID19 BINAXNOW: SARS Coronavirus 2 Ag: NEGATIVE

## 2022-04-17 NOTE — Assessment & Plan Note (Signed)
Negative covid testing today  Reassuring exam with clear lungs but need to monitor carefully due to age Disc symptom care-see AVS

## 2022-04-17 NOTE — Assessment & Plan Note (Signed)
Bp is controlled with amlodipine, coreg, hydralazine and imdur  BP: (!) 118/58   No dizziness

## 2022-04-17 NOTE — Assessment & Plan Note (Addendum)
Headache worse on R in setting of past occipital neuralgia and DDD of neck Takes gabapentin  Also has a head cold since Sunday (reassuring exam) No new neuro changes  Reviewed last cervical and head imaging as well as recent neuro surgical progress note   Disc further eval- since this headache is new recommend CT of head to make sure there is no bleed or CVA  Inst to watch for worse headache or any new neuro changes   ER precautions noted

## 2022-04-17 NOTE — Assessment & Plan Note (Signed)
Unsure if this is playing into today's headache  On gabapentin currently

## 2022-04-17 NOTE — Patient Instructions (Addendum)
I want to get a CT scan of your head since this is a new headache (though it may come from your neck or past occipital neuralgia)   If you don't hear from anyone this afternoon about the CT please give [place] a call   In the meantime if your headache gets a lot worse or if you have any new weakness or neurologic symptoms go to the ER    For cold symptoms Drink fluids and rest !  Treat symptoms as needed  Mucinex DM is good for cough and congestion  Your lungs sound ok but we need to watch for worse cough or any shortness of breath   Try to work on your fluid intake

## 2022-04-17 NOTE — Progress Notes (Signed)
Subjective:    Patient ID: Morgan Hubbard, female    DOB: 02-22-1935, 87 y.o.   MRN: [id number]  HPI Pt presents for neck and head pain  Has a cold since Sunday   Wt Readings from Last 3 Encounters:  04/17/22 143 lb (64.9 kg)  01/04/22 141 lb 3.2 oz (64 kg)  12/25/21 142 lb 6 oz (64.6 kg)   27.02 kg/m  2 wk of headache  Had a bad headache on the R side of head (starts on R side of her neck and then goes up to the top of her head)  [place]  On /off ever since   Is constant and not throbbing  Dull ache  Not sharp  Unsure what her triggers are   May not drink enough fluids    No other neuro symptoms   H/o occip neuralgia  Takes gabapentin 300 mg tid (takes it twice daily now) - on it for years and years    Sees neuo surgery for neck problems Not interested in surgery  Was considering PT and cervical MRI    HTN BP Readings from Last 3 Encounters:  04/17/22 (!) 118/58  04/04/22 (!) 153/75  03/19/22 124/64   Pulse Readings from Last 3 Encounters:  04/17/22 82  04/04/22 83  01/04/22 69   Carvedilol, imdur hydralazine   Sees nephrology     CT CS in sept  Disc herniation C4-5  Spinal stenosis     Had XR EOS scan recently  Noted severe degenerative disc disease  Scoliosis with imbalance of the spine  Some anerolisthesis if low T and high L spine   Patient Active Problem List   Diagnosis Date Noted   Headache 04/17/2022   Pain due to onychomycosis of toenails of both feet 04/04/2022   Near syncope 11/29/2021   Hypotension 11/29/2021   Generalized weakness 11/28/2021   Encounter for medication review 11/28/2021   Nausea 11/05/2021   Shoulder pain 04/25/2021   History of breast cancer 06/18/2020   Anemia 06/16/2020   Elevated TSH 06/16/2020   Occipital neuralgia 02/14/2020   Hair loss 11/09/2018   Leg weakness, bilateral 06/29/2018   Pedal edema 06/22/2018   AICD (automatic cardioverter/defibrillator) present 06/11/2018   GERD  (gastroesophageal reflux disease) 06/10/2018   AKI (acute kidney injury) ([place]) 06/10/2018   Cholecystitis 06/10/2018   Tingling 10/06/2017   Chronic back pain 18/84/1660   Chronic systolic (congestive) heart failure ([place]) 02/25/2017   Osteopenia 06/16/2016   Routine general medical examination at a health care facility 04/24/2016   Estrogen deficiency 04/24/2016   Viral URI with cough 02/16/2016   Urticaria 11/03/2015   Cerumen impaction 03/10/2015   Encounter for Medicare annual wellness exam 06/13/2014   Stroke, small vessel ([place]) 01/18/2014   Family history of hemochromatosis 01/18/2014   Cerebral thrombosis with cerebral infarction ([place]) 01/14/2014   Expressive aphasia 01/13/2014   Nonischemic cardiomyopathy ([place]) 04/05/2013   Shortness of breath 09/03/2012   Elevated transaminase level 05/18/2012   Spinal stenosis of lumbar region 12/10/2011   Mixed incontinence urge and stress 12/10/2011   Renal insufficiency 11/04/2011   Goiter 01/14/2011   Fatty liver 08/28/2010   Type II diabetes mellitus with renal manifestations ([place]) 07/09/2010   Hyperlipidemia associated with type 2 diabetes mellitus ([place]) 07/09/2010   Hyperkalemia 07/09/2010   REFLEX SYMPATHETIC DYSTROPHY 07/09/2010   Neuropathy 07/09/2010   Essential hypertension 07/09/2010   CARDIOMYOPATHY 07/09/2010   OVERACTIVE BLADDER 07/09/2010   Past Medical  History:  Diagnosis Date   Arthritis    "hands" (01/13/2014)   Basal cell carcinoma 01/2014   "bridge of nose"   Cardiac LV ejection fraction >40%    "it was 43 last year" (01/13/2014)   Chronic lower back pain    CKD (chronic kidney disease), stage III ([place])    acute on chronic stage III/notes 06/10/2018   Colon polyps    Expressive aphasia    "3 times in the last week" (01/13/2014)   GERD (gastroesophageal reflux disease)    Goiter past remote   treated with RI   Hyperlipidemia    Hyperpotassemia    Hypertension    Hypertonicity of bladder    Inflammatory  and toxic neuropathy, unspecified    LBBB (left bundle branch block)    Migraine    "stopped many years ago" (01/13/2014)   Mini stroke 01/2014   Other primary cardiomyopathies    Overactive bladder    Presence of combination internal cardiac defibrillator (ICD) and pacemaker    Reflex sympathetic dystrophy, unspecified    Shingles    Shortness of breath    ambulation   Stroke ([place])    Thyroid disease    Type II diabetes mellitus ([place])    Ulcer    Urine incontinence    Vertigo    hx of   Past Surgical History:  Procedure Laterality Date   BACK SURGERY     BI-VENTRICULAR PACEMAKER INSERTION (CRT-P)  10/2014   [facility]   BREAST CYST EXCISION Left 1959   CARDIAC CATHETERIZATION  "several"   [place]   CATARACT EXTRACTION W/ INTRAOCULAR LENS IMPLANT Left ~ 2005   several eye injections   CATARACT EXTRACTION W/PHACO Right 05/04/2020   Procedure: CATARACT EXTRACTION PHACO AND INTRAOCULAR LENS PLACEMENT ([place]) RIGHT DIABETIC;  Surgeon: Birder Robson, MD;  Location: [facility];  Service: Ophthalmology;  Laterality: Right;  [place] 00:58.5 CDE 6.70 Fluid Pack [place] # [id number]   CHOLECYSTECTOMY N/A 06/13/2018   Procedure: LAPAROSCOPIC CHOLECYSTECTOMY;  Surgeon: Donnie Mesa, MD;  Location: [facility];  Service: General;  Laterality: N/A;   COLONOSCOPY  7/12   normal (hx of polyps in past)    CT SCAN  3/12   outside hosp- lung nodule and gallstones   ERCP N/A 06/12/2018   Procedure: ENDOSCOPIC RETROGRADE CHOLANGIOPANCREATOGRAPHY (ERCP);  Surgeon: Carol Ada, MD;  Location: [facility];  Service: Endoscopy;  Laterality: N/A;   [place]   "knot on index"   KIDNEY DONATION Left 1989   LUMBAR LAMINECTOMY/DECOMPRESSION MICRODISCECTOMY  1999   LUMBAR LAMINECTOMY/DECOMPRESSION MICRODISCECTOMY  01/31/2012   Procedure: LUMBAR LAMINECTOMY/DECOMPRESSION MICRODISCECTOMY 2 LEVELS;  Surgeon: Eustace Moore, MD;  Location: [facility] [facility];  Service: Neurosurgery;  Laterality: N/A;  Thoracic  twelve-lumbar one, lumbar one-two laminectomy    POSTERIOR LAMINECTOMY / DECOMPRESSION CERVICAL SPINE  1999   REMOVAL OF STONES  06/12/2018   Procedure: REMOVAL OF STONES;  Surgeon: Carol Ada, MD;  Location: [place];  Service: Endoscopy;;   SPHINCTEROTOMY  06/12/2018   Procedure: Joan Mayans;  Surgeon: Carol Ada, MD;  [facility];  Service: Endoscopy;;   TUBAL LIGATION  1976   UPPER GASTROINTESTINAL ENDOSCOPY     Social History   Tobacco Use   Smoking status: Never   Smokeless tobacco: Never  Vaping Use   Vaping Use: Never used  Substance Use Topics   Alcohol use: No    Alcohol/week: 0.0 standard drinks of alcohol   Drug use: No  Family History  Problem Relation Age of Onset   Arthritis Mother    Cancer Mother        uterine and mouth   Hyperlipidemia Mother    Stroke Mother    Hypertension Mother    Alcohol abuse Father    Diabetes Father    Cancer Sister        breast   Diabetes Sister    Cancer Brother        lung cancer   Kidney disease Brother    Diabetes Brother    Hyperlipidemia Sister    Heart disease Sister    Hypertension Sister    Diabetes Sister    Hyperlipidemia Brother    Kidney failure Brother    Diabetes Daughter    Addison's disease Other    Allergies  Allergen Reactions   Ace Inhibitors Swelling    REACTION: tongue swelling   Lisinopril Swelling    Swelling of tongue   Insulin Detemir Itching   Nsaids Other (See Comments)    Renal issues   Codeine Nausea Only   Hydrochlorothiazide Other (See Comments)    Dizziness/funny feeling   Tylenol [Acetaminophen] Other (See Comments)    Fatty liver - use with caution   Current Outpatient Medications on File Prior to Visit  Medication Sig Dispense Refill   amLODipine (NORVASC) 2.5 MG tablet Take 2.5 mg by mouth at bedtime.     aspirin EC 81 MG tablet Take 1 tablet (81 mg total) by mouth daily. Swallow whole. 90 tablet 3   atorvastatin (LIPITOR) 80 MG tablet Take 1  tablet (80 mg total) by mouth daily. 90 tablet 3   carvedilol (COREG) 12.5 MG tablet Take 1 tablet (12.5 mg total) by mouth in the morning, at noon, and at bedtime. (Patient taking differently: Take 12.5 mg by mouth. 2 in AM, 1 in PM) 270 tablet 3   cetirizine (ZYRTEC) 10 MG tablet Take 10 mg by mouth daily.     clotrimazole-betamethasone (LOTRISONE) cream Apply 1 application topically as needed (groin).      dapagliflozin propanediol (FARXIGA) 5 MG TABS tablet Take 5 mg by mouth daily.     famotidine (PEPCID) 20 MG tablet Take 1 tablet by mouth twice daily 60 tablet 5   gabapentin (NEURONTIN) 300 MG capsule Take 1 capsule (300 mg total) by mouth 3 (three) times daily. 270 capsule 3   glimepiride (AMARYL) 1 MG tablet Take 1-2 tablets (1-2 mg total) by mouth daily with breakfast. 60 tablet 1   hydrALAZINE (APRESOLINE) 50 MG tablet Take 1 tablet (50 mg total) by mouth 3 (three) times daily. 270 tablet 3   insulin degludec (TRESIBA) 100 UNIT/ML FlexTouch Pen Inject 16 Units into the skin at bedtime.     isosorbide mononitrate (IMDUR) 60 MG 24 hr tablet Take 1 tablet (60 mg total) by mouth in the morning and at bedtime. 180 tablet 3   letrozole (FEMARA) 2.5 MG tablet Take 2.5 mg by mouth daily.     nystatin cream (MYCOSTATIN) Apply 1 application topically 2 (two) times daily as needed (irritation.).      pantoprazole (PROTONIX) 40 MG tablet Take 1 tablet by mouth once daily 90 tablet 1   traMADol (ULTRAM) 50 MG tablet TAKE 1 TO 2 TABLETS BY MOUTH EVERY 8 HOURS AS NEEDED FOR MODERATE TO SEVERE PAIN 60 tablet 0   TRULICITY 1.5 EQ/6.8TM SOPN Inject 1.5 mg into the skin once a week.     [name] ([name]) 75 MG  TABS Take 75 mg by mouth daily. 30 tablet 3   vitamin B-12 (CYANOCOBALAMIN) 1000 MCG tablet Take 1,000 mcg by mouth daily.      Vitamin D, Ergocalciferol, (DRISDOL) 1.25 MG (50000 UT) CAPS capsule Take 50,000 Units by mouth every 7 (seven) days.     No current facility-administered medications on  file prior to visit.    Review of Systems  Constitutional:  Negative for activity change, appetite change, fatigue, fever and unexpected weight change.  HENT:  Negative for congestion, ear pain, rhinorrhea, sinus pressure and sore throat.   Eyes:  Negative for pain, redness and visual disturbance.  Respiratory:  Negative for cough, shortness of breath and wheezing.   Cardiovascular:  Negative for chest pain and palpitations.  Gastrointestinal:  Negative for abdominal pain, blood in stool, constipation and diarrhea.  Endocrine: Negative for polydipsia and polyuria.  Genitourinary:  Negative for dysuria, frequency and urgency.  Musculoskeletal:  Positive for neck pain. Negative for arthralgias, back pain and myalgias.  Skin:  Negative for pallor and rash.  Allergic/Immunologic: Negative for environmental allergies.  Neurological:  Positive for headaches. Negative for dizziness, syncope, facial asymmetry, light-headedness and numbness.       Poor balance  Walking with a cane   Hematological:  Negative for adenopathy. Does not bruise/bleed easily.  Psychiatric/Behavioral:  Negative for decreased concentration and dysphoric mood. The patient is not nervous/anxious.        Objective:   Physical Exam Constitutional:      General: She is not in acute distress.    Appearance: Normal appearance. She is well-developed and normal weight. She is not ill-appearing or diaphoretic.     Comments: Frail appearing   HENT:     Head: Normocephalic and atraumatic.     Comments: No facial or temporal tenderness   No scalp tenderness    Right Ear: Tympanic membrane, ear canal and external ear normal.     Left Ear: Tympanic membrane, ear canal and external ear normal.     Nose: Nose normal.     Mouth/Throat:     Mouth: Mucous membranes are moist.     Pharynx: No oropharyngeal exudate.  Eyes:     General: No scleral icterus.       Right eye: No discharge.        Left eye: No discharge.      Conjunctiva/sclera: Conjunctivae normal.     Pupils: Pupils are equal, round, and reactive to light.     Comments: No nystagmus  Neck:     Thyroid: No thyromegaly.     Vascular: No carotid bruit or JVD.     Trachea: No tracheal deviation.     Comments: Limited rom of CS due to pain (lateral) Cervical musculature is mildly tender  Is able to flex  Cardiovascular:     Rate and Rhythm: Normal rate and regular rhythm.     Heart sounds: Normal heart sounds. No murmur heard. Pulmonary:     Effort: Pulmonary effort is normal. No respiratory distress.     Breath sounds: Normal breath sounds. No stridor. No wheezing, rhonchi or rales.     Comments: Good air exch No wheeze or rhonchi Abdominal:     General: Bowel sounds are normal. There is no distension.     Palpations: Abdomen is soft. There is no mass.     Tenderness: There is no abdominal tenderness.  Musculoskeletal:        General: No tenderness.  Cervical back: Full passive range of motion without pain and neck supple.  Lymphadenopathy:     Cervical: No cervical adenopathy.  Skin:    General: Skin is warm and dry.     Coloration: Skin is not pale.     Findings: No erythema or rash.  Neurological:     Mental Status: She is alert and oriented to person, place, and time. Mental status is at baseline.     Cranial Nerves: Cranial nerves 2-12 are intact. No cranial nerve deficit, dysarthria or facial asymmetry.     Sensory: Sensation is intact. No sensory deficit.     Motor: No weakness, tremor, atrophy, abnormal muscle tone or pronator drift.     Coordination: Coordination normal. Finger-Nose-Finger Test normal.     Gait: Gait normal.     Deep Tendon Reflexes: Reflexes are normal and symmetric.     Comments: No focal cerebellar signs   Some forward lean on rhomberg (generally poor balance baseline and walks with a cane)   Psychiatric:        Mood and Affect: Mood normal.        Behavior: Behavior normal.        Thought  Content: Thought content normal.           Assessment & Plan:   Problem List Items Addressed This Visit       Cardiovascular and Mediastinum   Essential hypertension    Bp is controlled with amlodipine, coreg, hydralazine and imdur  BP: (!) 118/58   No dizziness         Respiratory   Viral URI with cough    Negative covid testing today  Reassuring exam with clear lungs but need to monitor carefully due to age Disc symptom care-see AVS         Relevant Orders   POC COVID-19 BinaxNow (Completed)     Nervous and Auditory   Occipital neuralgia    Unsure if this is playing into today's headache  On gabapentin currently         Other   Headache - Primary    Headache worse on R in setting of past occipital neuralgia and DDD of neck Takes gabapentin  Also has a head cold since Sunday (reassuring exam) No new neuro changes  Reviewed last cervical and head imaging as well as recent neuro surgical progress note   Disc further eval- since this headache is new recommend CT of head to make sure there is no bleed or CVA  Inst to watch for worse headache or any new neuro changes   ER precautions noted       Relevant Orders   CT HEAD WO CONTRAST (5MM)

## 2022-04-18 ENCOUNTER — Encounter: Payer: Self-pay | Admitting: Family Medicine

## 2022-04-18 ENCOUNTER — Telehealth: Payer: Self-pay | Admitting: Family Medicine

## 2022-04-18 NOTE — Telephone Encounter (Signed)
Patient called in and stated sh is scheduled to have a CT scan 04/19/2022. She stated she had one done back in September and was wanting to know if she still need one. Please advise. Thank you!

## 2022-04-18 NOTE — Telephone Encounter (Signed)
LMOM for pt to CB.  

## 2022-04-18 NOTE — Telephone Encounter (Signed)
Since the headache is new since she had that I do want her to get it, thanks

## 2022-04-18 NOTE — Telephone Encounter (Signed)
Patient called and was returning a phone call.

## 2022-04-18 NOTE — Telephone Encounter (Signed)
Patient returned call. Information given she will go for CT as requested.  No further action needed at this time.

## 2022-04-19 ENCOUNTER — Telehealth: Payer: Self-pay | Admitting: Pharmacist

## 2022-04-19 ENCOUNTER — Other Ambulatory Visit: Payer: Medicare HMO

## 2022-04-19 ENCOUNTER — Telehealth: Payer: Self-pay | Admitting: Family Medicine

## 2022-04-19 NOTE — Telephone Encounter (Signed)
Patient called and stated the CT scan that suppose to be done today that her insurance won't pay for it and it needs to be canceled, she also don't know the name or place or who to call to cancel. Call back number [phone].

## 2022-04-19 NOTE — Telephone Encounter (Signed)
Ok, now Valente David tells me that the CT was in fact approved even though I was told that it was not   There may have been confusion because she has one in Sept that was for a different reason  [facility] called and they will re schedule If you have not called her for the last phone note please hold off and if you did that is ok also   Thanks !

## 2022-04-19 NOTE — Telephone Encounter (Signed)
Contacted [facility] and reordered Freestyle libre sensors for the patient . Patient contacted.    [name], [place]  [phone]

## 2022-04-19 NOTE — Telephone Encounter (Signed)
Thanks for the heads up, and the cold could play a role (her exam was reassuring when she was here)  If shortness of breath/worse wheezing let us know   She last saw Dr Truman Hayward ([facility]) in 2021 and she was dx with occipital neuralgia  This headache may be similar but hard to tell if it is the same  If that is the case and it if does not improve her gabapentin can by carefully increased but I would like to avoid this unless necessary   If the head ache does not continue to improve with the cold let us know  May consider return to neurology and a change in her gabapentin  If suddenly severe or any neuro/stroke symptoms please go to the ER in the meantime

## 2022-04-19 NOTE — Telephone Encounter (Signed)
Spoke w/pt she stated she does not have any new sx's she states her headache is more like a dull ache right now its not as bad as it was before. Pt did state she has a cold right now , I offered to get her scheduled to see if could help treatr she states she does not want another appt that she will let it run its course.

## 2022-04-19 NOTE — Telephone Encounter (Signed)
They required a peer to peer eval- and I called for this with her ins co and told it was denied unfortunately.  Please let me know how her headache is (? Worse or better) and if she has any new symptoms ?   Thanks

## 2022-04-19 NOTE — Telephone Encounter (Signed)
Received message from patient - she is out of [employer] sensors. She gets supplies through [facility]. Will have pharmayc assistant contact [facility] for refill and inform patient.  [facility]: [phone]

## 2022-04-22 DIAGNOSIS — N183 Chronic kidney disease, stage 3 unspecified: Secondary | ICD-10-CM | POA: Diagnosis not present

## 2022-04-22 DIAGNOSIS — R809 Proteinuria, unspecified: Secondary | ICD-10-CM | POA: Diagnosis not present

## 2022-04-22 DIAGNOSIS — D649 Anemia, unspecified: Secondary | ICD-10-CM | POA: Diagnosis not present

## 2022-04-22 DIAGNOSIS — E875 Hyperkalemia: Secondary | ICD-10-CM | POA: Diagnosis not present

## 2022-04-22 DIAGNOSIS — N3281 Overactive bladder: Secondary | ICD-10-CM | POA: Diagnosis not present

## 2022-04-22 DIAGNOSIS — N184 Chronic kidney disease, stage 4 (severe): Secondary | ICD-10-CM | POA: Diagnosis not present

## 2022-04-22 DIAGNOSIS — I129 Hypertensive chronic kidney disease with stage 1 through stage 4 chronic kidney disease, or unspecified chronic kidney disease: Secondary | ICD-10-CM | POA: Diagnosis not present

## 2022-04-22 DIAGNOSIS — E1122 Type 2 diabetes mellitus with diabetic chronic kidney disease: Secondary | ICD-10-CM | POA: Diagnosis not present

## 2022-04-22 DIAGNOSIS — K219 Gastro-esophageal reflux disease without esophagitis: Secondary | ICD-10-CM | POA: Diagnosis not present

## 2022-04-22 NOTE — Telephone Encounter (Signed)
Left message on voicemail to call the office back. 

## 2022-04-22 NOTE — Telephone Encounter (Signed)
Patient notified as instructed by telephone and verbalized understanding. Patient stated that she feels that her cold is getting better. Patient stated that she just got back from her kidney doctor and she was told that her lungs sound clear. Patient stated that she has been drinking protein drinks and lots of fluids. Patient stated if she does not continue to improve she will call the office back. Patient was given ER precautions and she verbalized understanding.

## 2022-04-29 DIAGNOSIS — Z794 Long term (current) use of insulin: Secondary | ICD-10-CM | POA: Diagnosis not present

## 2022-04-29 DIAGNOSIS — E1165 Type 2 diabetes mellitus with hyperglycemia: Secondary | ICD-10-CM | POA: Diagnosis not present

## 2022-05-01 ENCOUNTER — Other Ambulatory Visit: Payer: Self-pay | Admitting: Family Medicine

## 2022-05-02 NOTE — Telephone Encounter (Signed)
Name of Medication: Tramadol Name of Pharmacy: [facility] Last Fill or Written Date and Quantity: 01/23/22 #60 tabs/ 0 refill Last Office Visit and Type: 04/17/22 head/neck pain Next Office Visit and Type: none scheduled

## 2022-05-07 ENCOUNTER — Telehealth: Payer: Self-pay

## 2022-05-07 NOTE — Chronic Care Management (AMB) (Signed)
Contacted the patient to relay the gemtesa tier exception allowance. Patient expressed understanding. Patient reports recently stopping the glimepiride medication and noticed some climbing up of her BG's.  Fasting BG's > 150  and today 05/07/22  2:00pm  BG 240. We discussed keeping a log and showing CPP at upcoming appointment 05/14/22. If her BG's continue to rise high than she will contact Dr.Solum .  [name], CPP notified  Morgan Hubbard, [place]  [phone]

## 2022-05-09 ENCOUNTER — Telehealth: Payer: Self-pay

## 2022-05-09 NOTE — Chronic Care Management (AMB) (Signed)
    Chronic Care Management Pharmacy Assistant   Name: Morgan Hubbard  MRN: [id number] DOB: Nov 15, 1934  Reason for Encounter: Reminder Call   Medications: Outpatient Encounter Medications as of 05/09/2022  Medication Sig   amLODipine (NORVASC) 2.5 MG tablet Take 2.5 mg by mouth at bedtime.   aspirin EC 81 MG tablet Take 1 tablet (81 mg total) by mouth daily. Swallow whole.   atorvastatin (LIPITOR) 80 MG tablet Take 1 tablet (80 mg total) by mouth daily.   carvedilol (COREG) 12.5 MG tablet Take 1 tablet (12.5 mg total) by mouth in the morning, at noon, and at bedtime. (Patient taking differently: Take 12.5 mg by mouth. 2 in AM, 1 in PM)   cetirizine (ZYRTEC) 10 MG tablet Take 10 mg by mouth daily.   clotrimazole-betamethasone (LOTRISONE) cream Apply 1 application topically as needed (groin).    dapagliflozin propanediol (FARXIGA) 5 MG TABS tablet Take 5 mg by mouth daily.   famotidine (PEPCID) 20 MG tablet Take 1 tablet by mouth twice daily   gabapentin (NEURONTIN) 300 MG capsule Take 1 capsule (300 mg total) by mouth 3 (three) times daily.   glimepiride (AMARYL) 1 MG tablet Take 1-2 tablets (1-2 mg total) by mouth daily with breakfast.   hydrALAZINE (APRESOLINE) 50 MG tablet Take 1 tablet (50 mg total) by mouth 3 (three) times daily.   insulin degludec (TRESIBA) 100 UNIT/ML FlexTouch Pen Inject 16 Units into the skin at bedtime.   isosorbide mononitrate (IMDUR) 60 MG 24 hr tablet Take 1 tablet (60 mg total) by mouth in the morning and at bedtime.   letrozole (FEMARA) 2.5 MG tablet Take 2.5 mg by mouth daily.   nystatin cream (MYCOSTATIN) Apply 1 application topically 2 (two) times daily as needed (irritation.).    pantoprazole (PROTONIX) 40 MG tablet Take 1 tablet by mouth once daily   traMADol (ULTRAM) 50 MG tablet TAKE 1 TO 2 TABLETS BY MOUTH EVERY 8 HOURS AS NEEDED FOR MODERATE TO SEVERE PAIN   TRULICITY 1.5 RX/5.4MG SOPN Inject 1.5 mg into the skin once a week.   [name] ([name]) 75 MG  TABS Take 75 mg by mouth daily.   vitamin B-12 (CYANOCOBALAMIN) 1000 MCG tablet Take 1,000 mcg by mouth daily.    Vitamin D, Ergocalciferol, (DRISDOL) 1.25 MG (50000 UT) CAPS capsule Take 50,000 Units by mouth every 7 (seven) days.   No facility-administered encounter medications on file as of 05/09/2022.   Morgan Hubbard was contacted to remind of upcoming telephone visit with [name] on 05/14/22 at 10:00. Patient was reminded to have any blood glucose and blood pressure readings available for review at appointment.   Patient confirmed appointment.  Are you having any problems with your medications? No   Do you have any concerns you like to discuss with the pharmacist? No  CCM referral has been placed prior to visit?  Yes   Star Rating Drugs: Medication:  Last Fill: Day Supply Atorvastatin '80mg'$  03/25/22 90 Glimepiride '1mg'$  01/03/22  90           has been on hold Tresiba  86/76/19 509 Trulicity  32/67/12 28 Farxiga '5mg'$   05/08/22 [place], CPP notified  [name], [place]  [phone]

## 2022-05-10 ENCOUNTER — Ambulatory Visit (INDEPENDENT_AMBULATORY_CARE_PROVIDER_SITE_OTHER): Payer: Medicare HMO | Admitting: Family Medicine

## 2022-05-10 ENCOUNTER — Encounter: Payer: Self-pay | Admitting: Family Medicine

## 2022-05-10 VITALS — BP 128/66 | HR 78 | Temp 98.2°F | Ht 61.0 in | Wt 146.2 lb

## 2022-05-10 DIAGNOSIS — R051 Acute cough: Secondary | ICD-10-CM

## 2022-05-10 DIAGNOSIS — H9201 Otalgia, right ear: Secondary | ICD-10-CM | POA: Diagnosis not present

## 2022-05-10 DIAGNOSIS — J069 Acute upper respiratory infection, unspecified: Secondary | ICD-10-CM

## 2022-05-10 LAB — POC COVID19 BINAXNOW: SARS Coronavirus 2 Ag: NEGATIVE

## 2022-05-10 MED ORDER — AMOXICILLIN 500 MG PO CAPS: 500.0000 mg | ORAL_CAPSULE | Freq: Two times a day (BID) | ORAL | 0 refills | Status: DC

## 2022-05-10 NOTE — Patient Instructions (Addendum)
I think you have an early ear infection  Take the amoxicillin as directed  A warm compress may help   Generic flonase nasal spray may help with ear pressure/fluid Use 2 sprays in each nostril once daily for the next several weeks  This also helps congestion and allergies    For cough - try mucinex DM or robitussin DM (tussin for diabetics is ok also)  You may be getting a cold   Update if not starting to improve in a week or if worsening

## 2022-05-10 NOTE — Assessment & Plan Note (Signed)
Not enough cerumen noted to cause symptoms  TM is dull and mildly pink/ may be early OM  She gets occip neuralgia on this side also   Will tx with amoxicillin  Also steroid ns to help with ETD Update if not starting to improve in a week or if worsening

## 2022-05-10 NOTE — Progress Notes (Unsigned)
Subjective:    Patient ID: Morgan Hubbard, female    DOB: 09/18/1934, 87 y.o.   MRN: [id number]  HPI Pt presents for ear complaint  Also mentions cough during her visit   Wt Readings from Last 3 Encounters:  05/10/22 146 lb 3.2 oz (66.3 kg)  04/17/22 143 lb (64.9 kg)  01/04/22 141 lb 3.2 oz (64 kg)   27.62 kg/m   Ears are painful  Severe at times R ear is worse- wed and thurs night no relief   Also muffled hearing   Use heating pad  Used a home syringe flush and it did not work   H/o wax in her ears   Not a lot of appetite since June  Taking protein shakes   Noticed some cough yesterday and today  Mild, dry  Feels ok  No fever ? If some runny nose  H/o chronic headache  Occip neuralgia Uses voltaren gel on R side  Does not think this is cause of ear pain   Results for orders placed or performed in visit on 05/10/22  POC COVID-19 BinaxNow  Result Value Ref Range   SARS Coronavirus 2 Ag Negative Negative       Review of Systems  Constitutional:  Positive for appetite change. Negative for activity change, fatigue, fever and unexpected weight change.  HENT:  Negative for congestion, ear pain, rhinorrhea, sinus pressure and sore throat.   Eyes:  Negative for pain, redness and visual disturbance.  Respiratory:  Positive for cough. Negative for choking, chest tightness, shortness of breath, wheezing and stridor.   Cardiovascular:  Negative for chest pain and palpitations.  Gastrointestinal:  Negative for abdominal pain, blood in stool, constipation and diarrhea.  Endocrine: Negative for polydipsia and polyuria.  Genitourinary:  Negative for dysuria, frequency and urgency.  Musculoskeletal:  Negative for arthralgias, back pain and myalgias.  Skin:  Negative for pallor and rash.  Allergic/Immunologic: Negative for environmental allergies.  Neurological:  Negative for dizziness, syncope and headaches.       Headache from occip neuralgia is improved  Uses  voltaren gel on the area    Hematological:  Negative for adenopathy. Does not bruise/bleed easily.  Psychiatric/Behavioral:  Negative for decreased concentration and dysphoric mood. The patient is not nervous/anxious.        Objective:   Physical Exam Constitutional:      General: She is not in acute distress.    Appearance: Normal appearance. She is well-developed and normal weight. She is not ill-appearing or diaphoretic.     Comments: No temple or sinus tenderness  HENT:     Head: Normocephalic and atraumatic.     Left Ear: Tympanic membrane, ear canal and external ear normal. There is no impacted cerumen.     Ears:     Comments: R TM is due and slt pink Small flake of cerumen at entrance of R ear, but able to see past it  No canal swelling or erythema       Nose:     Comments: Boggy nares     Mouth/Throat:     Mouth: Mucous membranes are moist.     Pharynx: Oropharynx is clear. No oropharyngeal exudate or posterior oropharyngeal erythema.  Eyes:     General:        Right eye: No discharge.        Left eye: No discharge.     Extraocular Movements: Extraocular movements intact.     Conjunctiva/sclera: Conjunctivae normal.  Pupils: Pupils are equal, round, and reactive to light.  Neck:     Thyroid: No thyromegaly.     Vascular: No carotid bruit or JVD.  Cardiovascular:     Rate and Rhythm: Normal rate and regular rhythm.     Heart sounds: Normal heart sounds.     No gallop.  Pulmonary:     Effort: Pulmonary effort is normal. No respiratory distress.     Breath sounds: Normal breath sounds. No stridor. No wheezing, rhonchi or rales.     Comments: Occ dry cough  Abdominal:     General: There is no distension or abdominal bruit.     Palpations: Abdomen is soft.  Musculoskeletal:     Cervical back: Normal range of motion and neck supple. No tenderness.     Right lower leg: No edema.     Left lower leg: No edema.  Lymphadenopathy:     Cervical: No cervical  adenopathy.  Skin:    General: Skin is warm and dry.     Coloration: Skin is not pale.     Findings: No rash.  Neurological:     Mental Status: She is alert.     Cranial Nerves: No cranial nerve deficit.     Coordination: Coordination normal.     Deep Tendon Reflexes: Reflexes are normal and symmetric. Reflexes normal.     Comments: No occipital tenderness   Psychiatric:        Mood and Affect: Mood normal.           Assessment & Plan:   Problem List Items Addressed This Visit       Respiratory   Viral URI with cough    Mild symptoms and some ear pain with findings of OM on the right  Negative covid testing  Disc symptoms care with flonase and expectorant with DM Reassuring exam  Amox for the OM Update if not starting to improve in a week or if worsening   ER precautions noted          Other   Cough - Primary   Relevant Orders   POC COVID-19 BinaxNow (Completed)   Right ear pain    Not enough cerumen noted to cause symptoms  TM is dull and mildly pink/ may be early OM  She gets occip neuralgia on this side also   Will tx with amoxicillin  Also steroid ns to help with ETD Update if not starting to improve in a week or if worsening

## 2022-05-12 NOTE — Assessment & Plan Note (Signed)
Mild symptoms and some ear pain with findings of OM on the right  Negative covid testing  Disc symptoms care with flonase and expectorant with DM Reassuring exam  Amox for the OM Update if not starting to improve in a week or if worsening   ER precautions noted

## 2022-05-13 ENCOUNTER — Encounter: Payer: Self-pay | Admitting: Family Medicine

## 2022-05-13 MED ORDER — PREDNISONE 10 MG PO TABS: ORAL_TABLET | ORAL | 0 refills | Status: DC

## 2022-05-14 ENCOUNTER — Ambulatory Visit: Payer: Medicare HMO | Admitting: Pharmacist

## 2022-05-14 DIAGNOSIS — I5022 Chronic systolic (congestive) heart failure: Secondary | ICD-10-CM

## 2022-05-14 DIAGNOSIS — N184 Chronic kidney disease, stage 4 (severe): Secondary | ICD-10-CM

## 2022-05-14 DIAGNOSIS — N318 Other neuromuscular dysfunction of bladder: Secondary | ICD-10-CM

## 2022-05-14 DIAGNOSIS — E1169 Type 2 diabetes mellitus with other specified complication: Secondary | ICD-10-CM

## 2022-05-14 DIAGNOSIS — E1129 Type 2 diabetes mellitus with other diabetic kidney complication: Secondary | ICD-10-CM

## 2022-05-14 DIAGNOSIS — I10 Essential (primary) hypertension: Secondary | ICD-10-CM

## 2022-05-14 NOTE — Patient Instructions (Signed)
Visit Information  Phone number for Pharmacist: [phone]   Goals Addressed   None     Care Plan : [place]  Updates made by [name], RPH since 05/14/2022 12:00 AM     Problem: Hypertension, Hyperlipidemia, Diabetes, Heart Failure, Coronary Artery Disease, and Chronic Kidney Disease   Priority: High     Long-Range Goal: Disease Management   Start Date: 01/24/2022  Expected End Date: 02/01/2023  This Visit's Progress: Not on track  Recent Progress: On track  Priority: High  Note:   Current Barriers:  Unable to maintain control of DM  Pharmacist Clinical Goal(s):  Patient will achieve improvement in DM as evidenced by A1c < 8% through collaboration with PharmD and provider.   Interventions: 1:1 collaboration with [place], [name], MD regarding development and update of comprehensive plan of care as evidenced by provider attestation and co-signature Inter-disciplinary care team collaboration (see longitudinal plan of care) Comprehensive medication review performed; medication list updated in electronic medical record  Hypertension / Heart Failure (BP goal <140/90) -Controlled- BP generally < 140/90 at home, improved with switch from [name] to [place]; she recently had mild hypotension in office visit at [facility] but improved with 16 oz of water -Current home readings: 124/64 -Denies hypotensive symptoms, but does report headache when BP is elevated. -Last ejection fraction: 45-50% (Date: 11/30/21) - improved from 15-20% (2015) -HF type: HFimpEF; NYHA Class: II-III -Current treatment: Carvedilol 12.5 mg - 2 AM, 1 PM - Appropriate, Query Effective Hydralazine 50 mg TID -Appropriate, Query Effective Isosorbide MN 60 mg BID -Appropriate, Query Effective Amlodipine 2.5 mg daily HS -Appropriate, Query Effective Farxiga 5 mg daily (HW) -Appropriate, Query Effective -Medications previously tried: furosemide, HCTZ, losartan, lisinopril (tongue  swelling) -Educated on BP goals and benefits of medications for prevention of heart attack, stroke and kidney damage; -Counseled to monitor BP at home daily for 1-2 weeks -Reviewed finances - pt enrolled in [employer] for Farxiga (Cardiomyopathy) -Recommended to continue current medication  Hyperlipidemia: (LDL goal < 70) -Controlled - LDL 46 (06/2020) at goal, due for repeat lipid panel -Hx stroke, NICM -Current treatment: Atorvastatin 80 mg daily - Appropriate, Effective, Safe, Accessible Aspirin 81 mg daily - Appropriate, Effective, Safe, Accessible -Medications previously tried: pravastatin  -Educated on Cholesterol goals;  -Recommended to continue current medication; repeat lipid panel at next OV  Diabetes (A1c goal <8%) -Uncontrolled, improving- A1c 9.8% (02/2022); per Freestyle Libre sugar control is improved overall, but recent acute illness (ear infection) has led to deterioration, she is on course of prednisone as well -Current home glucose readings: (Libre 3): reports unavailable -BG 308 right now. Took prednisone today. -Current medications: Farxiga 5 mg daily (HW)- Appropriate, Query Effective (for DM) Glimepiride 1 mg daily AM (started ~2012)- on hold Tresiba 16 units AM -Appropriate, Effective, Query Safe Trulicity 1.5 mg weekly - Appropriate, Effective, Safe, Accessible Freestyle Libre 3 w/ reader - Appropriate, Effective, Safe, Accessible -Medications previously tried: Geneticist, molecular (cost), Tradjenta (started Trulicity), metformin (GFR < 30) -Reviewed [name] is likely not very effective for DM control with GFR <30, though it should still remain on board for help with HF and CKD -Discussed diet at length - advised to try to keep snacks to < 15 g of carbs, maximize protein/vegetables regularly and avoid large portions of carbs -Discussed sugar control during acute illness/prednisone use; discussed restarting glimepiride while she is taking prednisone, she has endocrine  appt next week, advised to follow up as scheduled  Chronic Kidney Disease  Stage 4  -All medications assessed for renal dosing and appropriateness in chronic kidney disease. -Pt has 1 kidney - she donated 1 to her brother -Pt is on [place]. Pt is not on ACE/ARB due to hx of tongue swelling with lisinopril. -Recommended to continue current medication  GERD (Goal: minimize symptoms of reflux) -Controlled - per pt report -Current treatment  Pantoprazole 40 mg daily - Appropriate, Effective, Safe, Accessible Famotidine 20 mg BID -Appropriate, Effective, Safe, Accessible -Medications previously tried: n/a -Hx of Bleeds/ulcers: No -Counseled on small meals, elevating head, and sleeping on left side -Recommended to continue current medication  OAB (Goal: minimize nocturia) -Not ideally controlled - pt has switched from [name] to [place] due to high BP issues; she reports OAB control is about the same, still urinates frequently -[name] tier exception reduced price from $100 to $35 -Current treatment  [name] 70 mg daily - Appropriate, Effective, Safe, Accessible -Medications previously tried: Toviaz, oxybutynin, tolterodine  -Reviewed benefits of PT -Recommend to continue current medication  Neuropathy (Goal: manage symptoms) -Controlled - pt has been out of gabapentin for a while and neuropathy was worse, but she has refill now -Current treatment  Gabapentin 300 mg TID - Appropriate, Effective, Safe, Accessible -Medications previously tried: n/a  -Recommended to continue current medication  Breast cancer (Goal: prevent recurrence) -Controlled  -Follows with oncology (Dr Rip Harbour, [facility]). Per oncology, plan for 7-year course of Letrozole, will be completed 2024 -Current treatment  Letrozole 2.5 mg daily - Appropriate, Effective, Safe, Accessible -Medications previously tried: n/a  -Recommended to continue current medication  Patient Goals/Self-Care Activities Patient will:  -  take medications as prescribed as evidenced by patient report and record review focus on medication adherence by routine check glucose daily, document, and provide at future appointments check blood pressure daily, document, and provide at future appointments collaborate with provider on medication access solutions      Patient verbalizes understanding of instructions and care plan provided today and agrees to view in [place]. Active MyChart status and patient understanding of how to access instructions and care plan via MyChart confirmed with patient.    Telephone follow up appointment with pharmacy team member scheduled for: 1 month  [name], PharmD, [facility] Clinical Pharmacist [facility] [phone]

## 2022-05-14 NOTE — Progress Notes (Signed)
Chronic Care Management Pharmacy Note  05/14/22 Name:  Morgan Hubbard MRN:  [id number] DOB:  August 21, 1934  Summary: CCM F/U visit -DM: A1c 9.8% (02/2022), sugar control was improved previously but with recent acute illness (ear infection) control has deteriorated; BG 308 today on prednisone; she has been holding glimepiride for the past month due to previous low sugars -HTN: Pt reports BP generally < 140/90, improved with switch from [name] to [place] and addition of amlodipine. OAB control is similar. -Pt complains of low appetite   Recommendations/Changes made from today's visit: -Advised to restart glimepiride 1 daily while taking prednisone; f/u with endocrine next week as scheduled  Plan: -Pharmacist follow up televisit scheduled for 1 month -Endocrine appt 05/20/22    Subjective: Morgan Hubbard is an 87 y.o. year old female who is a primary patient of Tower, [name], MD.  The CCM team was consulted for assistance with disease management and care coordination needs.    Engaged with patient by telephone for follow up visit in response to provider referral for pharmacy case management and/or care coordination services.   Consent to Services:  The patient was given information about Chronic Care Management services, agreed to services, and gave verbal consent prior to initiation of services.  Please see initial visit note for detailed documentation.   Patient Care Team: Tower, [name], MD as PCP - General (Family Medicine) Carolan Clines, MD (Inactive) as Attending Physician (Urology) Minna Merritts, MD as Consulting Physician (Cardiology) Daryll Brod, MD as Referring Physician (Cardiology) Oneta Rack, MD as Consulting Physician (Dermatology) Ward, Jeani Hawking, MD as Referring Physician (Internal Medicine) Wynona Canes, MD as Referring Physician (Neurology) Kimmick, Linus Mako, MD as Referring Physician (Oncology) Gabriel Carina Betsey Holiday, MD as Physician  Assistant (Internal Medicine) [name], MD as Consulting Physician (Family Medicine) Alvester Chou., DDS as Consulting Physician (Dentistry) Birder Robson, MD as Referring Physician (Ophthalmology) [name], [facility] as Pharmacist (Pharmacist)  Recent office visits: 05/10/22 Dr [name] OV: acute cough, ear pain. Rx Amoxicillin.  01/04/22-[name],MD(PCP)- right ear fullness(New-farxiga- very $$ but does not hurt the kidney)Gfr of 27.85-K is back in nl range -impaction removed with simple ear irrigation and tolerated well   12/17/21-[name],MD(PCP)-f/u AKI,Labs,May need insulin in the future ,start PT,increase fluid intake.(Potassium is back into the normal range,Kidney numbers are stable to slightly improved ) 12/07/21-Graham Duncan,MD(fam med)-hospital f/u Hold metformin for now.  Continue linagliptin.  I would try restarting glimepiride 63m with breakfast.  If sugar is still above 200 after 1 week, then increase to 2 mg with breakfast. Would stay off losartan and hydrochlorothiazide for now.Labs ordered  11/28/21-[name],MD(PCP)-f/u hospital visit,Renal labs today (Worse kidney function - was sent to ER today with malaise and bp issues and dehydration ) If improved will start back losartan hctz - [facility] 11/05/21-[name],MD(PCP)-GI upset,labs(Potassium is mildly high,stable kidney numbers ,Liver tests are stable but protein levels are low Other labs are ok -Adding pepcid 20 mg twice daily   Recent consult visits: 04/03/22 [facility]tha Alfred[facility] mild hypotension in clinic improved with 16 oz water. Continue current meds.   03/28/22 NP MElliot Dally([facility]): musclar decondition - pt will avoid surgery. Restart PT.  02/12/22 Dr SGabriel Carina(Endocrine): Stop Tradjenta. Start Trulicity 1.5 mg. 83/8/88Dr SGabriel Carina(Endocrine):  Increase Tresiba to 16 units at bedtime, reduce glimepiride to 198mtake 2 tablets in am, restart farxiga 25m18make 1 tablet  morning.She has been working with  primary care provider and Cardiology on adjusting her BP medications. No longer taking losartan. F/u 4-6 weeks  12/25/21-Timothy Gollan,MD(cardio)-f/u heart disease,EKG, imaging reviewed,Recommend she increase Hydrlazine 50 TID,Recommend she increase Coreg 12.5 TID,monitor BP daily,f/u 6 months 12/17/21-Anna Solum,MD(endo)-Telephone call to report BG's running high 11/06/21-Anna Solum,MD-(endo)-f/u DM,Stop glimepiride, replace with Tradjenta 5 mg daliy. F/u 3 months w labs 08/01/21-Cary Ward,MD(cardio)-f/u, start some PT,ride stationary bike,no medication changes, f/u 6 months 07/27/21-Anna Solum,MD(endo)-f/u DM,Add Trulicity 8.29 mg weekly.f/u 3 months   Hospital visits: 11/29/21 - 11/30/21 Admission [facility] hypotension. Given IVF. Resume carvedilol, hold losartan/hctz, hold hydralazine, imdur  11/19/21 - 11/24/21 Admission ([facility]): Failure to thrive, AKI. Increased coreg to 25 mg BID. D/C losartan/hctz, d/c plavix (no need for DAPT). Held metformin. Removed Farxiga (cost)   Objective:  Lab Results  Component Value Date   CREATININE 1.67 (H) 01/01/2022   BUN 30 (H) 01/01/2022   GFR 27.85 (L) 01/01/2022   GFRNONAA 28 (L) 11/30/2021   GFRAA 41 (L) 06/15/2018   NA 140 01/01/2022   K 5.1 01/01/2022   CALCIUM 8.8 01/01/2022   CO2 31 01/01/2022   GLUCOSE 170 (H) 01/01/2022    Lab Results  Component Value Date/Time   HGBA1C 8.0 (H) 11/29/2021 04:16 PM   HGBA1C endo 01/03/2019 12:00 AM   HGBA1C 7.7 (H) 05/02/2017 09:40 AM   GFR 27.85 (L) 01/01/2022 10:29 AM   GFR 34.72 (L) 12/17/2021 12:44 PM   MICROALBUR 78.0 (H) 04/22/2016 02:55 PM    Last diabetic Eye exam:  Lab Results  Component Value Date/Time   HMDIABEYEEXA No Retinopathy 03/31/2020 12:00 AM    Last diabetic Foot exam: No results found for: "HMDIABFOOTEX"   Lab Results  Component Value Date   CHOL 99 06/12/2020   HDL 33.20 (L) 06/12/2020   LDLCALC 46 06/10/2019   LDLDIRECT 46.0 06/12/2020   TRIG  209.0 (H) 06/12/2020   CHOLHDL 3 06/12/2020       Latest Ref Rng & Units 11/29/2021   12:53 PM 11/28/2021    3:37 PM 11/05/2021   11:17 AM  Hepatic Function  Total Protein 6.5 - 8.1 g/dL 5.3  5.4  5.5   Albumin 3.5 - 5.0 g/dL 2.8  3.4  3.3   AST 15 - 41 U/L _0 ALT 0 - 44 U/L _1 Alk Phosphatase 38 - 126 U/L 69  66  55   Total Bilirubin 0.3 - 1.2 mg/dL 1.0  0.4  0.6   Bilirubin, Direct 0.0 - 0.2 mg/dL 0.1   0.1     Lab Results  Component Value Date/Time   TSH 2.34 11/28/2021 03:37 PM   TSH 2.62 06/16/2020 11:18 AM   FREET4 1.46 06/16/2020 11:18 AM       Latest Ref Rng & Units 12/07/2021    1:37 PM 11/30/2021    2:22 AM 11/29/2021   12:53 PM  CBC  WBC 3.8 - 10.8 Thousand/uL 6.4  6.2  7.4   Hemoglobin 11.7 - 15.5 g/dL 11.0  10.4  10.6   Hematocrit 35.0 - 45.0 % 34.4  31.6  32.5   Platelets 140 - 400 Thousand/uL 179  177  185     No results found for: "VD25OH"  Clinical ASCVD: Yes  The ASCVD Risk score (Arnett DK, et al., 2019) failed to calculate for the following reasons:   The 2019 ASCVD risk score is only valid for ages 67 to 42   The patient  has a prior MI or stroke diagnosis       05/10/2022    4:07 PM 11/28/2021    3:03 PM 11/05/2021   10:32 AM  Depression screen PHQ 2/9  Decreased Interest 0 0 0  Down, Depressed, Hopeless 0 0 0  PHQ - 2 Score 0 0 0     Social History   Tobacco Use  Smoking Status Never  Smokeless Tobacco Never   BP Readings from Last 3 Encounters:  05/10/22 128/66  04/17/22 (!) 118/58  04/04/22 (!) 153/75   Pulse Readings from Last 3 Encounters:  05/10/22 78  04/17/22 82  04/04/22 83   Wt Readings from Last 3 Encounters:  05/10/22 146 lb 3.2 oz (66.3 kg)  04/17/22 143 lb (64.9 kg)  01/04/22 141 lb 3.2 oz (64 kg)   BMI Readings from Last 3 Encounters:  05/10/22 27.62 kg/m  04/17/22 27.02 kg/m  01/04/22 26.68 kg/m    Assessment/Interventions: Review of patient past medical history, allergies, medications,  health status, including review of consultants reports, laboratory and other test data, was performed as part of comprehensive evaluation and provision of chronic care management services.   SDOH:  (Social Determinants of Health) assessments and interventions performed: No - done 05/2021 SDOH Interventions    Flowsheet Row Chronic Care Management from 12/22/2020 in [place] at [place] from 06/10/2019 in [place] at [place] Interventions    Depression Interventions/Treatment  -- QVZ5-6 Score <4 Follow-up Not Indicated  Financial Strain Interventions Intervention Not Indicated --      [place]: No Food Insecurity (05/30/2021)  Housing: Low Risk  (05/30/2021)  Transportation Needs: No Transportation Needs (05/30/2021)  Alcohol Screen: Low Risk  (05/30/2021)  Depression (PHQ2-9): Low Risk  (05/10/2022)  Financial Resource Strain: Low Risk  (05/30/2021)  Physical Activity: Inactive (05/30/2021)  Social Connections: Moderately Isolated (05/30/2021)  Stress: No Stress Concern Present (05/30/2021)  Tobacco Use: Low Risk  (05/10/2022)    CCM Care Plan  Allergies  Allergen Reactions   Ace Inhibitors Swelling    REACTION: tongue swelling   Lisinopril Swelling    Swelling of tongue   Insulin Detemir Itching   Nsaids Other (See Comments)    Renal issues   Codeine Nausea Only   Hydrochlorothiazide Other (See Comments)    Dizziness/funny feeling   Tylenol [Acetaminophen] Other (See Comments)    Fatty liver - use with caution    Medications Reviewed Today     Reviewed by [name], [facility] (Pharmacist) on 05/14/22 at 1016  Med List Status: <None>   Medication Order Taking? Sig Documenting Provider Last Dose Status Informant  amLODipine (NORVASC) 2.5 MG tablet [id number] Yes Take 2.5 mg by mouth at bedtime. Justin Mend, MD Taking Active   amoxicillin (AMOXIL) 500 MG capsule [id number] Yes Take 1  capsule (500 mg total) by mouth 2 (two) times daily. Tower, [name], MD Taking Active   aspirin EC 81 MG tablet [id number] Yes Take 1 tablet (81 mg total) by mouth daily. Swallow whole. Minna Merritts, MD Taking Active Multiple Informants  atorvastatin (LIPITOR) 80 MG tablet [id number] Yes Take 1 tablet (80 mg total) by mouth daily. Minna Merritts, MD Taking Active   carvedilol (COREG) 12.5 MG tablet [id number] Yes Take 1 tablet (12.5 mg total) by mouth in the morning, at noon, and at bedtime.  Patient taking differently: Take 12.5 mg by mouth. 2 in AM, 1 in PM  Minna Merritts, MD Taking Active   cetirizine (ZYRTEC) 10 MG tablet [id number] Yes Take 10 mg by mouth daily. [provider] Taking Active Multiple Informants  clotrimazole-betamethasone (LOTRISONE) cream [id number] Yes Apply 1 application topically as needed (groin).  [provider] Taking Active Multiple Informants           Med Note Ebony Hail, AMBER B   Wed Jun 10, 2018  5:40 PM)    dapagliflozin propanediol (FARXIGA) 5 MG TABS tablet [id number] Yes Take 5 mg by mouth daily. [provider] Taking Active   famotidine (PEPCID) 20 MG tablet [id number] Yes Take 1 tablet by mouth twice daily Tower, [name], MD Taking Active   gabapentin (NEURONTIN) 300 MG capsule [id number] Yes Take 1 capsule (300 mg total) by mouth 3 (three) times daily. Tower, [name], MD Taking Active   glimepiride (AMARYL) 1 MG tablet [id number] No Take 1-2 tablets (1-2 mg total) by mouth daily with breakfast.  Patient not taking: Reported on 05/14/2022   Tonia Ghent, MD Not Taking Active   hydrALAZINE (APRESOLINE) 50 MG tablet [id number] Yes Take 1 tablet (50 mg total) by mouth 3 (three) times daily. Minna Merritts, MD Taking Active   insulin degludec (TRESIBA) 100 UNIT/ML FlexTouch Pen [id number] Yes Inject 16 Units into the skin at bedtime. [provider] Taking Active   isosorbide mononitrate (IMDUR) 60 MG 24 hr tablet  [id number] Yes Take 1 tablet (60 mg total) by mouth in the morning and at bedtime. Minna Merritts, MD Taking Active   letrozole [facility]) 2.5 MG tablet [id number] Yes Take 2.5 mg by mouth daily. [provider] Taking Active Multiple Informants  nystatin cream (MYCOSTATIN) [id number] Yes Apply 1 application topically 2 (two) times daily as needed (irritation.).  [provider] Taking Active Multiple Informants  pantoprazole (PROTONIX) 40 MG tablet [id number] Yes Take 1 tablet by mouth once daily Tower, [name], MD Taking Active Multiple Informants  predniSONE (DELTASONE) 10 MG tablet [id number] Yes Take 3 pills once daily by mouth for 3 days, then 2 pills once daily for 3 days, then 1 pill once daily for 3 days and then stop Tower, [name], MD Taking Active   traMADol (ULTRAM) 50 MG tablet [id number] Yes TAKE 1 TO 2 TABLETS BY MOUTH EVERY 8 HOURS AS NEEDED FOR MODERATE TO SEVERE PAIN Tower, [name], MD Taking Active   TRULICITY 1.5 WO/0.3OZ SOPN [id number] Yes Inject 1.5 mg into the skin once a week. Judi Cong, MD Taking Active   [name] [facility]) 75 MG TABS [id number] Yes Take 75 mg by mouth daily. Tower, [name], MD Taking Active   vitamin B-12 (CYANOCOBALAMIN) 1000 MCG tablet [id number] Yes Take 1,000 mcg by mouth daily.  [provider] Taking Active Multiple Informants  Vitamin D, Ergocalciferol, (DRISDOL) 1.25 MG (50000 UT) CAPS capsule [id number] Yes Take 50,000 Units by mouth every 7 (seven) days. [provider] Taking Active Multiple Informants           Med Note Tonia Ghent   Fri Dec 07, 2021 12:51 PM)              Patient Active Problem List   Diagnosis Date Noted   Right ear pain 05/10/2022   Headache 04/17/2022   Pain due to onychomycosis of toenails of both feet 04/04/2022   Near syncope 11/29/2021   Hypotension 11/29/2021   Generalized weakness 11/28/2021   Encounter for medication review 11/28/2021   Nausea 11/05/2021  Shoulder pain  04/25/2021   History of breast cancer 06/18/2020   Anemia 06/16/2020   Elevated TSH 06/16/2020   Occipital neuralgia 02/14/2020   Hair loss 11/09/2018   Leg weakness, bilateral 06/29/2018   Pedal edema 06/22/2018   AICD (automatic cardioverter/defibrillator) present 06/11/2018   GERD (gastroesophageal reflux disease) 06/10/2018   AKI (acute kidney injury) ([place]) 06/10/2018   Cholecystitis 06/10/2018   Tingling 10/06/2017   Chronic back pain 75/91/6384   Chronic systolic (congestive) heart failure ([place]) 02/25/2017   Osteopenia 06/16/2016   Routine general medical examination at a health care facility 04/24/2016   Estrogen deficiency 04/24/2016   Viral URI with cough 02/16/2016   Urticaria 11/03/2015   Cerumen impaction 03/10/2015   Encounter for Medicare annual wellness exam 06/13/2014   Cough 05/31/2014   Stroke, small vessel ([place]) 01/18/2014   Family history of hemochromatosis 01/18/2014   Cerebral thrombosis with cerebral infarction ([place]) 01/14/2014   Expressive aphasia 01/13/2014   Nonischemic cardiomyopathy ([place]) 04/05/2013   Shortness of breath 09/03/2012   Elevated transaminase level 05/18/2012   Spinal stenosis of lumbar region 12/10/2011   Mixed incontinence urge and stress 12/10/2011   Renal insufficiency 11/04/2011   Goiter 01/14/2011   Fatty liver 08/28/2010   Type II diabetes mellitus with renal manifestations ([place]) 07/09/2010   Hyperlipidemia associated with type 2 diabetes mellitus ([place]) 07/09/2010   Hyperkalemia 07/09/2010   REFLEX SYMPATHETIC DYSTROPHY 07/09/2010   Neuropathy 07/09/2010   Essential hypertension 07/09/2010   CARDIOMYOPATHY 07/09/2010   OVERACTIVE BLADDER 07/09/2010    Immunization History  Administered Date(s) Administered   Fluad Quad(high Dose 65+) 02/14/2020, 02/15/2021   Influenza,inj,Quad PF,6+ Mos 05/24/2015, 04/03/2016, 03/09/2018   Influenza-Unspecified 03/29/2013, 04/11/2014, 04/03/2016, 03/17/2017   PFIZER(Purple  Top)SARS-COV-2 Vaccination 07/16/2019, 08/06/2019, 03/03/2020   Pneumococcal Conjugate-13 06/13/2014   Pneumococcal Polysaccharide-23 01/14/2011    Conditions to be addressed/monitored:  Hypertension, Hyperlipidemia, Diabetes, Heart Failure, Coronary Artery Disease, and Chronic Kidney Disease  Care Plan : [place]  Updates made by [name], [place] since 05/14/2022 12:00 AM     Problem: Hypertension, Hyperlipidemia, Diabetes, Heart Failure, Coronary Artery Disease, and Chronic Kidney Disease   Priority: High     Long-Range Goal: Disease Management   Start Date: 01/24/2022  Expected End Date: 02/01/2023  This Visit's Progress: Not on track  Recent Progress: On track  Priority: High  Note:   Current Barriers:  Unable to maintain control of DM  Pharmacist Clinical Goal(s):  Patient will achieve improvement in DM as evidenced by A1c < 8% through collaboration with PharmD and provider.   Interventions: 1:1 collaboration with Tower, [name], MD regarding development and update of comprehensive plan of care as evidenced by provider attestation and co-signature Inter-disciplinary care team collaboration (see longitudinal plan of care) Comprehensive medication review performed; medication list updated in electronic medical record  Hypertension / Heart Failure (BP goal <140/90) -Controlled- BP generally < 140/90 at home, improved with switch from [name] to [place]; she recently had mild hypotension in office visit at [facility] but improved with 16 oz of water -Current home readings: 124/64 -Denies hypotensive symptoms, but does report headache when BP is elevated. -Last ejection fraction: 45-50% (Date: 11/30/21) - improved from 15-20% (2015) -HF type: HFimpEF; NYHA Class: II-III -Current treatment: Carvedilol 12.5 mg - 2 AM, 1 PM - Appropriate, Query Effective Hydralazine 50 mg TID -Appropriate, Query Effective Isosorbide MN 60 mg BID -Appropriate, Query  Effective Amlodipine 2.5 mg daily HS -Appropriate, Query Effective Farxiga 5  mg daily (HW) -Appropriate, Query Effective -Medications previously tried: furosemide, HCTZ, losartan, lisinopril (tongue swelling) -Educated on BP goals and benefits of medications for prevention of heart attack, stroke and kidney damage; -Counseled to monitor BP at home daily for 1-2 weeks -Reviewed finances - pt enrolled in [employer] for [place] (Cardiomyopathy) -Recommended to continue current medication  Hyperlipidemia: (LDL goal < 70) -Controlled - LDL 46 (06/2020) at goal, due for repeat lipid panel -Hx stroke, NICM -Current treatment: Atorvastatin 80 mg daily - Appropriate, Effective, Safe, Accessible Aspirin 81 mg daily - Appropriate, Effective, Safe, Accessible -Medications previously tried: pravastatin  -Educated on Cholesterol goals;  -Recommended to continue current medication; repeat lipid panel at next OV  Diabetes (A1c goal <8%) -Uncontrolled, improving- A1c 9.8% (02/2022); per Freestyle Libre sugar control is improved overall, but recent acute illness (ear infection) has led to deterioration, she is on course of prednisone as well -Current home glucose readings: (Libre 3): reports unavailable -BG 308 right now. Took prednisone today. -Current medications: Farxiga 5 mg daily (HW)- Appropriate, Query Effective (for DM) Glimepiride 1 mg daily AM (started ~2012)- on hold Tresiba 16 units AM -Appropriate, Effective, Query Safe Trulicity 1.5 mg weekly - Appropriate, Effective, Safe, Accessible Freestyle Libre 3 w/ reader - Appropriate, Effective, Safe, Accessible -Medications previously tried: Geneticist, molecular (cost), Tradjenta (started Trulicity), metformin (GFR < 30) -Reviewed [name] is likely not very effective for DM control with GFR <30, though it should still remain on board for help with HF and CKD -Discussed diet at length - advised to try to keep snacks to < 15 g of carbs, maximize  protein/vegetables regularly and avoid large portions of carbs -Discussed sugar control during acute illness/prednisone use; discussed restarting glimepiride while she is taking prednisone, she has endocrine appt next week, advised to follow up as scheduled  Chronic Kidney Disease Stage 4  -All medications assessed for renal dosing and appropriateness in chronic kidney disease. -Pt has 1 kidney - she donated 1 to her brother -Pt is on [place]. Pt is not on ACE/ARB due to hx of tongue swelling with lisinopril. -Recommended to continue current medication  GERD (Goal: minimize symptoms of reflux) -Controlled - per pt report -Current treatment  Pantoprazole 40 mg daily - Appropriate, Effective, Safe, Accessible Famotidine 20 mg BID -Appropriate, Effective, Safe, Accessible -Medications previously tried: n/a -Hx of Bleeds/ulcers: No -Counseled on small meals, elevating head, and sleeping on left side -Recommended to continue current medication  OAB (Goal: minimize nocturia) -Not ideally controlled - pt has switched from [name] to [place] due to high BP issues; she reports OAB control is about the same, still urinates frequently -[name] tier exception reduced price from $100 to $35 -Current treatment  [name] 70 mg daily - Appropriate, Effective, Safe, Accessible -Medications previously tried: Toviaz, oxybutynin, tolterodine  -Reviewed benefits of PT -Recommend to continue current medication  Neuropathy (Goal: manage symptoms) -Controlled - pt has been out of gabapentin for a while and neuropathy was worse, but she has refill now -Current treatment  Gabapentin 300 mg TID - Appropriate, Effective, Safe, Accessible -Medications previously tried: n/a  -Recommended to continue current medication  Breast cancer (Goal: prevent recurrence) -Controlled  -Follows with oncology (Dr Rip Harbour, [facility]). Per oncology, plan for 7-year course of Letrozole, will be completed 2024 -Current treatment   Letrozole 2.5 mg daily - Appropriate, Effective, Safe, Accessible -Medications previously tried: n/a  -Recommended to continue current medication  Patient Goals/Self-Care Activities Patient will:  - take medications as prescribed as evidenced by patient  report and record review focus on medication adherence by routine check glucose daily, document, and provide at future appointments check blood pressure daily, document, and provide at future appointments collaborate with provider on medication access solutions      Medication Assistance:  [place] (Cardiomyopathy) 12/25/21 - 12/25/22 ID: [id number] BIN: [id number] PCN: [id number] [id number]  Compliance/Adherence/Medication fill history: Care Gaps: Mammogram (due 11/14/20) Eye exam (due 03/31/21) Foot exam (due 06/16/21)  Star-Rating Drugs: Atorvastatin - PDC 100% Farxiga - PDC 87% Glimepiride - PDC 191% Trulicity - PDC 66%  Medication Access: Within the past 30 days, how often has patient missed a dose of medication? 0 Is a pillbox or other method used to improve adherence? Yes  Factors that may affect medication adherence? Confusion over doses prescribed Are meds synced by current pharmacy? No  Are meds delivered by current pharmacy? No  Does patient experience delays in picking up medications due to transportation concerns? No   [facility] Services Reviewed: Is patient disadvantaged to use [facility]?: No  Current Rx insurance plan: [employer] [place] Name and location of Current pharmacy:  [facility] [address] [place] [address] Phone: [phone] Fax: [phone]  [facility] services reviewed with patient today?: No  Patient requests to transfer care to [facility] Pharm[facility]?: No  Reason patient declined to change pharmacies: Not mentioned at this visit   Care Plan and Follow Up Patient Decision:  Patient agrees to Care Plan and  Follow-up.  Plan: Telephone follow up appointment with care management team member scheduled for:  4 weeks  [name], PharmD, BCACP Clinical Pharmacist [facility] at [facility] [phone]

## 2022-05-20 DIAGNOSIS — E1122 Type 2 diabetes mellitus with diabetic chronic kidney disease: Secondary | ICD-10-CM | POA: Diagnosis not present

## 2022-05-20 DIAGNOSIS — Z794 Long term (current) use of insulin: Secondary | ICD-10-CM | POA: Diagnosis not present

## 2022-05-20 DIAGNOSIS — R809 Proteinuria, unspecified: Secondary | ICD-10-CM | POA: Diagnosis not present

## 2022-05-20 DIAGNOSIS — N184 Chronic kidney disease, stage 4 (severe): Secondary | ICD-10-CM | POA: Diagnosis not present

## 2022-05-20 DIAGNOSIS — E1129 Type 2 diabetes mellitus with other diabetic kidney complication: Secondary | ICD-10-CM | POA: Diagnosis not present

## 2022-05-20 DIAGNOSIS — E1165 Type 2 diabetes mellitus with hyperglycemia: Secondary | ICD-10-CM | POA: Diagnosis not present

## 2022-05-20 DIAGNOSIS — E785 Hyperlipidemia, unspecified: Secondary | ICD-10-CM | POA: Diagnosis not present

## 2022-05-20 DIAGNOSIS — E1169 Type 2 diabetes mellitus with other specified complication: Secondary | ICD-10-CM | POA: Diagnosis not present

## 2022-05-23 ENCOUNTER — Ambulatory Visit (INDEPENDENT_AMBULATORY_CARE_PROVIDER_SITE_OTHER): Payer: Medicare HMO | Admitting: Family Medicine

## 2022-05-23 ENCOUNTER — Encounter: Payer: Self-pay | Admitting: Family Medicine

## 2022-05-23 VITALS — BP 130/60 | HR 81 | Temp 98.4°F | Ht 61.0 in | Wt 140.4 lb

## 2022-05-23 DIAGNOSIS — N898 Other specified noninflammatory disorders of vagina: Secondary | ICD-10-CM | POA: Insufficient documentation

## 2022-05-23 DIAGNOSIS — H6991 Unspecified Eustachian tube disorder, right ear: Secondary | ICD-10-CM

## 2022-05-23 DIAGNOSIS — L821 Other seborrheic keratosis: Secondary | ICD-10-CM | POA: Diagnosis not present

## 2022-05-23 MED ORDER — FLUCONAZOLE 150 MG PO TABS: 150.0000 mg | ORAL_TABLET | Freq: Once | ORAL | 0 refills | Status: AC

## 2022-05-23 NOTE — Assessment & Plan Note (Signed)
Per patient this occurred along with the recent use of amoxicillin.  She does mention dysuria but is not able to provide a sample and her symptoms are much more likely due to yeast.  I will treat empirically with Diflucan 150 mg p.o. x 1

## 2022-05-23 NOTE — Assessment & Plan Note (Signed)
Resolution of right otitis media status post amoxicillin. Continued fluid behind right eardrum likely due to eustachian tube dysfunction.  Prednisone is causing her blood sugars to rise so we will avoid further courses of treatment with prednisone.  She will restart Nasacort 2 sprays per nostril daily as well as start saline running nasal irrigation.  I reviewed with her that this should improve over time but she will contact her PCP if it is not improving over the next 2 to 4 weeks.

## 2022-05-23 NOTE — Progress Notes (Signed)
Patient ID: Morgan Hubbard, female    DOB: 01-Oct-1934, 87 y.o.   MRN: [id number]  This visit was conducted in person.  BP 130/60   Pulse 81   Temp 98.4 F (36.9 C) (Oral)   Ht '5\' 1"'$  (1.549 m)   Wt 140 lb 6 oz (63.7 kg)   LMP 06/03/1992   SpO2 93%   BMI 26.52 kg/m    CC:  Chief Complaint  Patient presents with   Ear Pain    Right     HPI: Morgan Hubbard is a 87 y.o. female  pt of Morgan Hubbard, with DMpresenting on 05/23/2022 for Ear Pain (Right)  Seen on 12/8 by PCP for acute cough... treated  for OM with amoxicillin, steroid taper.  Took last prednisone yesterday.  She no longer with pain but..  continue decreased hearing. She reports continued right  ear fullness.  No fever.  No face pain.  No SOB, no wheeze.   Using tussin for occ cough.   Average 372  On farxiga, dulaglutide, triseba... just saw ENDO Dr. Gabriel Hubbard few days ago and adjusted meds.  Vaginal irritation and burning after amox. Unable to given urine sample.      Relevant past medical, surgical, family and social history reviewed and updated as indicated. Interim medical history since our last visit reviewed. Allergies and medications reviewed and updated. Outpatient Medications Prior to Visit  Medication Sig Dispense Refill   amLODipine (NORVASC) 2.5 MG tablet Take 2.5 mg by mouth at bedtime.     aspirin EC 81 MG tablet Take 1 tablet (81 mg total) by mouth daily. Swallow whole. 90 tablet 3   atorvastatin (LIPITOR) 80 MG tablet Take 1 tablet (80 mg total) by mouth daily. 90 tablet 3   carvedilol (COREG) 12.5 MG tablet Take 1 tablet (12.5 mg total) by mouth in the morning, at noon, and at bedtime. 270 tablet 3   cetirizine (ZYRTEC) 10 MG tablet Take 10 mg by mouth daily.     clotrimazole-betamethasone (LOTRISONE) cream Apply 1 application topically as needed (groin).      dapagliflozin propanediol (FARXIGA) 5 MG TABS tablet Take 5 mg by mouth daily.     Dulaglutide 3 MG/0.5ML SOPN Inject 0.5 mLs into the  skin once a week.     famotidine (PEPCID) 20 MG tablet Take 1 tablet by mouth twice daily 60 tablet 5   gabapentin (NEURONTIN) 300 MG capsule Take 1 capsule (300 mg total) by mouth 3 (three) times daily. 270 capsule 3   glimepiride (AMARYL) 1 MG tablet Take 1-2 tablets (1-2 mg total) by mouth daily with breakfast. 60 tablet 1   hydrALAZINE (APRESOLINE) 50 MG tablet Take 1 tablet (50 mg total) by mouth 3 (three) times daily. 270 tablet 3   insulin degludec (TRESIBA) 100 UNIT/ML FlexTouch Pen Inject 26 Units into the skin at bedtime.     isosorbide mononitrate (IMDUR) 60 MG 24 hr tablet Take 1 tablet (60 mg total) by mouth in the morning and at bedtime. 180 tablet 3   letrozole (FEMARA) 2.5 MG tablet Take 2.5 mg by mouth daily.     nystatin cream (MYCOSTATIN) Apply 1 application topically 2 (two) times daily as needed (irritation.).      pantoprazole (PROTONIX) 40 MG tablet Take 1 tablet by mouth once daily 90 tablet 1   traMADol (ULTRAM) 50 MG tablet TAKE 1 TO 2 TABLETS BY MOUTH EVERY 8 HOURS AS NEEDED FOR MODERATE TO SEVERE PAIN 60 tablet  0   [name] ([name]) 75 MG TABS Take 75 mg by mouth daily. 30 tablet 3   vitamin B-12 (CYANOCOBALAMIN) 1000 MCG tablet Take 1,000 mcg by mouth daily.      Vitamin D, Ergocalciferol, (DRISDOL) 1.25 MG (50000 UT) CAPS capsule Take 50,000 Units by mouth every 7 (seven) days.     amoxicillin (AMOXIL) 500 MG capsule Take 1 capsule (500 mg total) by mouth 2 (two) times daily. 14 capsule 0   insulin degludec (TRESIBA) 100 UNIT/ML FlexTouch Pen Inject 16 Units into the skin at bedtime.     predniSONE (DELTASONE) 10 MG tablet Take 3 pills once daily by mouth for 3 days, then 2 pills once daily for 3 days, then 1 pill once daily for 3 days and then stop 18 tablet 0   TRULICITY 1.5 ZO/1.0RU SOPN Inject 1.5 mg into the skin once a week.     No facility-administered medications prior to visit.     Per HPI unless specifically indicated in ROS section below Review of  Systems  Constitutional:  Negative for fatigue and fever.  HENT:  Negative for congestion.   Eyes:  Negative for pain.  Respiratory:  Negative for cough and shortness of breath.   Cardiovascular:  Negative for chest pain, palpitations and leg swelling.  Gastrointestinal:  Negative for abdominal pain.  Genitourinary:  Negative for dysuria and vaginal bleeding.  Musculoskeletal:  Negative for back pain.  Neurological:  Negative for syncope, light-headedness and headaches.  Psychiatric/Behavioral:  Negative for dysphoric mood.    Objective:  BP 130/60   Pulse 81   Temp 98.4 F (36.9 C) (Oral)   Ht '5\' 1"'$  (1.549 m)   Wt 140 lb 6 oz (63.7 kg)   LMP 06/03/1992   SpO2 93%   BMI 26.52 kg/m   Wt Readings from Last 3 Encounters:  05/23/22 140 lb 6 oz (63.7 kg)  05/10/22 146 lb 3.2 oz (66.3 kg)  04/17/22 143 lb (64.9 kg)      Physical Exam Constitutional:      General: She is not in acute distress.    Appearance: Normal appearance. She is well-developed. She is not ill-appearing or toxic-appearing.  HENT:     Head: Normocephalic.     Right Ear: Hearing, ear canal and external ear normal. A middle ear effusion is present. Tympanic membrane is not injected, scarred, perforated, erythematous, retracted or bulging.     Left Ear: Hearing, tympanic membrane, ear canal and external ear normal.  No middle ear effusion. Tympanic membrane is not injected, scarred, perforated, erythematous, retracted or bulging.     Nose: No mucosal edema or rhinorrhea.     Right Sinus: No maxillary sinus tenderness or frontal sinus tenderness.     Left Sinus: No maxillary sinus tenderness or frontal sinus tenderness.     Mouth/Throat:     Pharynx: Uvula midline.  Eyes:     General: Lids are normal. Lids are everted, no foreign bodies appreciated.     Conjunctiva/sclera: Conjunctivae normal.     Pupils: Pupils are equal, round, and reactive to light.  Neck:     Thyroid: No thyroid mass or thyromegaly.      Vascular: No carotid bruit.     Trachea: Trachea normal.  Cardiovascular:     Rate and Rhythm: Normal rate and regular rhythm.     Pulses: Normal pulses.     Heart sounds: Normal heart sounds, S1 normal and S2 normal. No murmur heard.    No  friction rub. No gallop.  Pulmonary:     Effort: Pulmonary effort is normal. No tachypnea or respiratory distress.     Breath sounds: Normal breath sounds. No decreased breath sounds, wheezing, rhonchi or rales.  Abdominal:     General: Bowel sounds are normal.     Palpations: Abdomen is soft.     Tenderness: There is no abdominal tenderness.  Musculoskeletal:     Cervical back: Normal range of motion and neck supple.  Skin:    General: Skin is warm and dry.     Findings: No rash.  Neurological:     Mental Status: She is alert.  Psychiatric:        Mood and Affect: Mood is not anxious or depressed.        Speech: Speech normal.        Behavior: Behavior normal. Behavior is cooperative.        Thought Content: Thought content normal.        Judgment: Judgment normal.       Results for orders placed or performed in visit on 05/10/22  POC COVID-19 [facility]  Result Value Ref Range   SARS Coronavirus 2 Ag Negative Negative     COVID 19 screen:  No recent travel or known exposure to COVID19 The patient denies respiratory symptoms of COVID 19 at this time. The importance of social distancing was discussed today.   Assessment and Plan    Problem List Items Addressed This Visit     ETD (Eustachian tube dysfunction), right - Primary    Resolution of right otitis media status post amoxicillin. Continued fluid behind right eardrum likely due to eustachian tube dysfunction.  Prednisone is causing her blood sugars to rise so we will avoid further courses of treatment with prednisone.  She will restart Nasacort 2 sprays per nostril daily as well as start saline running nasal irrigation.  I reviewed with her that this should improve over time but  she will contact her PCP if it is not improving over the next 2 to 4 weeks.      Vaginal irritation    Per patient this occurred along with the recent use of amoxicillin.  She does mention dysuria but is not able to provide a sample and her symptoms are much more likely due to yeast.  I will treat empirically with Diflucan 150 mg p.o. x 1      Return and ER precautions provided.   Eliezer Lofts, MD

## 2022-05-23 NOTE — Patient Instructions (Addendum)
Start Nasocort 2 sprays  per nostril daily.  Start saline spray twice daily.  It will take some time to resolve.. but follow up with Dr.Tower if not improving after 2 weeks.  Can try fluconazole for possible yeast infection after antibiotics.

## 2022-05-31 ENCOUNTER — Ambulatory Visit (INDEPENDENT_AMBULATORY_CARE_PROVIDER_SITE_OTHER): Payer: Medicare HMO

## 2022-05-31 VITALS — Ht 61.0 in | Wt 140.0 lb

## 2022-05-31 DIAGNOSIS — Z0000 Encounter for general adult medical examination without abnormal findings: Secondary | ICD-10-CM | POA: Diagnosis not present

## 2022-05-31 NOTE — Progress Notes (Signed)
Subjective:   Morgan Hubbard is a 87 y.o. female who presents for Medicare Annual (Subsequent) preventive examination.  Review of Systems    No ROS.  Medicare Wellness Virtual Visit.  Visual/audio telehealth visit, UTA vital signs.   See social history for additional risk factors.   Cardiac Risk Factors include: advanced age (>52mn, >>37women);diabetes mellitus     Objective:    Today's Vitals   05/31/22 1454  Weight: 140 lb (63.5 kg)  Height: _0  (1.549 m)   Body mass index is 26.45 kg/m.     05/31/2022    3:06 PM 11/29/2021    6:00 PM 11/29/2021    1:02 PM 05/30/2021    1:18 PM 06/10/2019    2:03 PM 06/11/2018    1:42 PM 12/30/2016   10:01 AM  Advanced Directives  Does Patient Have a Medical Advance Directive? Yes No _1   Type of AParamedicof ACut and ShootLiving will  [place]Living will [place]ake ForestLiving will HR[place]Living will HStephenLiving will HWingateLiving will  Does patient want to make changes to medical advance directive? No - Patient declined   Yes (MAU/Ambulatory/Procedural Areas - Information given)  No - Patient declined No - Patient declined  Copy of HTamain Chart? No - copy requested    No - copy requested No - copy requested No - copy requested  Would patient like information on creating a medical advance directive?  No - Patient declined         Current Medications (verified) Outpatient Encounter Medications as of 05/31/2022  Medication Sig   amLODipine (NORVASC) 2.5 MG tablet Take 2.5 mg by mouth at bedtime.   aspirin EC 81 MG tablet Take 1 tablet (81 mg total) by mouth daily. Swallow whole.   atorvastatin (LIPITOR) 80 MG tablet Take 1 tablet (80 mg total) by mouth daily.   carvedilol (COREG) 12.5 MG tablet Take 1 tablet (12.5 mg total) by mouth in the morning, at noon, and at bedtime.    cetirizine (ZYRTEC) 10 MG tablet Take 10 mg by mouth daily.   clotrimazole-betamethasone (LOTRISONE) cream Apply 1 application topically as needed (groin).    dapagliflozin propanediol (FARXIGA) 5 MG TABS tablet Take 5 mg by mouth daily.   Dulaglutide 3 MG/0.5ML SOPN Inject 0.5 mLs into the skin once a week.   famotidine (PEPCID) 20 MG tablet Take 1 tablet by mouth twice daily   gabapentin (NEURONTIN) 300 MG capsule Take 1 capsule (300 mg total) by mouth 3 (three) times daily.   glimepiride (AMARYL) 1 MG tablet Take 1-2 tablets (1-2 mg total) by mouth daily with breakfast.   hydrALAZINE (APRESOLINE) 50 MG tablet Take 1 tablet (50 mg total) by mouth 3 (three) times daily.   insulin degludec (TRESIBA) 100 UNIT/ML FlexTouch Pen Inject 26 Units into the skin at bedtime.   isosorbide mononitrate (IMDUR) 60 MG 24 hr tablet Take 1 tablet (60 mg total) by mouth in the morning and at bedtime.   letrozole (FEMARA) 2.5 MG tablet Take 2.5 mg by mouth daily.   nystatin cream (MYCOSTATIN) Apply 1 application topically 2 (two) times daily as needed (irritation.).    pantoprazole (PROTONIX) 40 MG tablet Take 1 tablet by mouth once daily   traMADol (ULTRAM) 50 MG tablet TAKE 1 TO 2 TABLETS BY MOUTH EVERY 8 HOURS AS NEEDED FOR MODERATE TO SEVERE PAIN  [name] ([name]) 75 MG TABS Take 75 mg by mouth daily.   vitamin B-12 (CYANOCOBALAMIN) 1000 MCG tablet Take 1,000 mcg by mouth daily.    Vitamin D, Ergocalciferol, (DRISDOL) 1.25 MG (50000 UT) CAPS capsule Take 50,000 Units by mouth every 7 (seven) days.   No facility-administered encounter medications on file as of 05/31/2022.    Allergies (verified) Ace inhibitors, Lisinopril, Insulin detemir, Nsaids, Codeine, Hydrochlorothiazide, and Tylenol [acetaminophen]   History: Past Medical History:  Diagnosis Date   Arthritis    "hands" (01/13/2014)   Basal cell carcinoma 01/2014   "bridge of nose"   Cardiac LV ejection fraction >40%    "it was 43 last year"  (01/13/2014)   Chronic lower back pain    CKD (chronic kidney disease), stage III ([place])    acute on chronic stage III/notes 06/10/2018   Colon polyps    Expressive aphasia    "3 times in the last week" (01/13/2014)   GERD (gastroesophageal reflux disease)    Goiter past remote   treated with RI   Hyperlipidemia    Hyperpotassemia    Hypertension    Hypertonicity of bladder    Inflammatory and toxic neuropathy, unspecified    LBBB (left bundle branch block)    Migraine    "stopped many years ago" (01/13/2014)   Mini stroke 01/2014   Other primary cardiomyopathies    Overactive bladder    Presence of combination internal cardiac defibrillator (ICD) and pacemaker    Reflex sympathetic dystrophy, unspecified    Shingles    Shortness of breath    ambulation   Stroke ([place])    Thyroid disease    Type II diabetes mellitus ([place])    Ulcer    Urine incontinence    Vertigo    hx of   Past Surgical History:  Procedure Laterality Date   BACK SURGERY     BI-VENTRICULAR PACEMAKER INSERTION (CRT-P)  10/2014   [facility]   BREAST CYST EXCISION Left 1959   CARDIAC CATHETERIZATION  "several"   [place]   CATARACT EXTRACTION W/ INTRAOCULAR LENS IMPLANT Left ~ 2005   several eye injections   CATARACT EXTRACTION W/PHACO Right 05/04/2020   Procedure: CATARACT EXTRACTION PHACO AND INTRAOCULAR LENS PLACEMENT ([place]) RIGHT DIABETIC;  Surgeon: Birder Robson, MD;  Location: [facility];  Service: Ophthalmology;  Laterality: Right;  [place] 00:58.5 CDE 6.70 Fluid Pack [place] # [id number]   CHOLECYSTECTOMY N/A 06/13/2018   Procedure: LAPAROSCOPIC CHOLECYSTECTOMY;  Surgeon: Donnie Mesa, MD;  Location: [place];  Service: General;  Laterality: N/A;   COLONOSCOPY  7/12   normal (hx of polyps in past)    CT SCAN  3/12   outside hosp- lung nodule and gallstones   ERCP N/A 06/12/2018   Procedure: ENDOSCOPIC RETROGRADE CHOLANGIOPANCREATOGRAPHY (ERCP);  Surgeon: Carol Ada, MD;  Location: [place];  Service:  Endoscopy;  Laterality: N/A;   [place]   "knot on index"   KIDNEY DONATION Left 1989   LUMBAR LAMINECTOMY/DECOMPRESSION MICRODISCECTOMY  1999   LUMBAR LAMINECTOMY/DECOMPRESSION MICRODISCECTOMY  01/31/2012   Procedure: LUMBAR LAMINECTOMY/DECOMPRESSION MICRODISCECTOMY 2 LEVELS;  Surgeon: Eustace Moore, MD;  Location: [place] [facility];  Service: Neurosurgery;  Laterality: N/A;  Thoracic twelve-lumbar one, lumbar one-two laminectomy    POSTERIOR LAMINECTOMY / [place]   REMOVAL OF STONES  06/12/2018   Procedure: REMOVAL OF STONES;  Surgeon: Carol Ada, MD;  Location: [place];  Service: Endoscopy;;   SPHINCTEROTOMY  06/12/2018  Procedure: SPHINCTEROTOMY;  Surgeon: Carol Ada, MD;  Location: [facility] END[facility]COPY;  Service: Endoscopy;;   TUBAL LIGATION  1976   UPPER GASTROINTESTINAL ENDOSCOPY     Family History  Problem Relation Age of Onset   Arthritis Mother    Cancer Mother        uterine and mouth   Hyperlipidemia Mother    Stroke Mother    Hypertension Mother    Alcohol abuse Father    Diabetes Father    Cancer Sister        breast   Diabetes Sister    Cancer Brother        lung cancer   Kidney disease Brother    Diabetes Brother    Hyperlipidemia Sister    Heart disease Sister    Hypertension Sister    Diabetes Sister    Hyperlipidemia Brother    Kidney failure Brother    Diabetes Daughter    Addison's disease Other    Social History   Socioeconomic History   Marital status: Married    Spouse name: Not on file   Number of children: 2   Years of education: Not on file   Highest education level: Not on file  Occupational History   Occupation: retired - Production manager   Tobacco Use   Smoking status: Never   Smokeless tobacco: Never  Vaping Use   Vaping Use: Never used  Substance and Sexual Activity   Alcohol use: No    Alcohol/week: 0.0 standard drinks of alcohol   Drug use: No   Sexual activity: Yes  Other  Topics Concern   Not on file  Social History Narrative   Married 2nd time   Daughter is Surveyor, minerals    Social Determinants of Health   Financial Resource Strain: Low Risk  (05/31/2022)   Overall Financial Resource Strain (CARDIA)    Difficulty of Paying Living Expenses: Not hard at all  Food Insecurity: No Food Insecurity (05/31/2022)   Hunger Vital Sign    Worried About Running Out of Food in the Last Year: Never true    Ran Out of Food in the Last Year: Never true  Transportation Needs: No Transportation Needs (05/31/2022)   PRAPARE - Hydrologist (Medical): No    Lack of Transportation (Non-Medical): No  Physical Activity: Inactive (05/30/2021)   Exercise Vital Sign    Days of Exercise per Week: 0 days    Minutes of Exercise per Session: 0 min  Stress: No Stress Concern Present (05/31/2022)   [place]    Feeling of Stress : Not at all  Social Connections: Moderately Isolated (05/31/2022)   Social Connection and Isolation Panel [NHANES]    Frequency of Communication with Friends and Family: More than three times a week    Frequency of Social Gatherings with Friends and Family: Three times a week    Attends Religious Services: Never    Active Member of Clubs or Organizations: No    Attends Archivist Meetings: Never    Marital Status: Married    Tobacco Counseling Counseling given: Not Answered   Clinical Intake:  Pre-visit preparation completed: Yes       Nutrition Risk Assessment: Does the patient have any non-healing wounds?  No  Has the patient had any unintentional weight loss or weight gain?  No   Diabetes: Is the patient diabetic?  Yes  If diabetic, was a CBG obtained  today?  No  Did the patient bring in their glucometer from home?  No  How often do you monitor your CBG's? Wears Libre.   Financial Strains and Diabetes Management: Are you having  any financial strains with the device, your supplies or your medication? No .  Does the patient want to be seen by Chronic Care Management for management of their diabetes?  No  Would the patient like to be referred to a Nutritionist or for Diabetic Management?  No       How often do you need to have someone help you when you read instructions, pamphlets, or other written materials from your doctor or pharmacy?: 1 - Never   Interpreter Needed?: No      Activities of Daily Living    05/31/2022    2:56 PM 11/29/2021    6:00 PM  In your present state of health, do you have any difficulty performing the following activities:  Hearing? 0 0  Vision? 0 0  Difficulty concentrating or making decisions? 0 0  Walking or climbing stairs? 1 1  Comment Cane in use when ambulating   Dressing or bathing? 0 0  Doing errands, shopping? 0 0  Preparing Food and eating ? N   Comment Husband meal preps. Self feeds.   Using the Toilet? N   In the past six months, have you accidently leaked urine? Y   Comment Followed by Urology and PCP. Managed with daily pad/liner and medication.   Do you have problems with loss of bowel control? N   Managing your Medications? N   Managing your Finances? N   Housekeeping or managing your Housekeeping? N     Patient Care Team: Tower, [name], MD as PCP - General (Family Medicine) Carolan Clines, MD (Inactive) as Attending Physician (Urology) Minna Merritts, MD as Consulting Physician (Cardiology) Daryll Brod, MD as Referring Physician (Cardiology) Oneta Rack, MD as Consulting Physician (Dermatology) Ward, Jeani Hawking, MD as Referring Physician (Internal Medicine) Wynona Canes, MD as Referring Physician (Neurology) Kimmick, Linus Mako, MD as Referring Physician (Oncology) Gabriel Carina Betsey Holiday, MD as Physician Assistant (Internal Medicine) [name], MD as Consulting Physician (Family Medicine) Alvester Chou., DDS as Consulting  Physician (Dentistry) Birder Robson, MD as Referring Physician (Ophthalmology) [name], [facility] as Pharmacist (Pharmacist)  Indicate any recent Medical Services you may have received from other than [facility] providers in the past year (date may be approximate).     Assessment:   This is a routine wellness examination for Morgan Hubbard.  I connected with  Morgan Hubbard on 05/31/22 by a audio enabled telemedicine application and verified that I am speaking with the correct person using two identifiers.  Patient Location: Home  Provider Location: Office/Clinic  I discussed the limitations of evaluation and management by telemedicine. The patient expressed understanding and agreed to proceed.   Hearing/Vision screen Hearing Screening - Comments:: Patient is able to hear conversational tones without difficulty.  No issues reported.   Vision Screening - Comments:: Last exam 03/2022, Dr. Percell Boston  Dietary issues and exercise activities discussed: Current Exercise Habits: Home exercise routine, Intensity: Mild She tries to have a healthy diet Decrease in appetite Drinks protein drinks 2-3 daily Good water intake   Goals Addressed             This Visit's Progress    Patient Stated       Would like to be more active.  Depression Screen    05/31/2022    3:04 PM 05/10/2022    4:07 PM 11/28/2021    3:03 PM 11/05/2021   10:32 AM 05/30/2021    1:21 PM 06/16/2020   11:29 AM 06/10/2019    2:05 PM  PHQ 2/9 Scores  PHQ - 2 Score 0 0 0 0 0 0 0  PHQ- 9 Score      2 0    Fall Risk    05/31/2022    3:08 PM 05/10/2022    4:07 PM 11/28/2021    3:03 PM 11/05/2021   10:32 AM 05/30/2021    1:20 PM  [place] in the past year? 0 0 0 0 1  Number falls in past yr: 0 0   1  Injury with Fall? 0 0   1  Comment     hurt right shoulder, left hip  Risk for fall due to : No Fall Risks No Fall Risks   Other (Comment)  Risk for fall due to: Comment     tripped  Follow up Falls  evaluation completed;Falls prevention discussed Falls evaluation completed Falls evaluation completed Falls evaluation completed Falls prevention discussed    FALL RISK PREVENTION PERTAINING TO THE HOME: Home free of loose throw rugs in walkways, pet beds, electrical cords, etc? Yes  Adequate lighting in your home to reduce risk of falls? Yes   ASSISTIVE DEVICES UTILIZED TO PREVENT FALLS: Life alert? No  Use of a cane, walker or w/c? Yes  Grab bars in the bathroom? Yes  Shower chair or bench in shower? Yes  Elevated toilet seat or a handicapped toilet? Yes   TIMED UP AND GO: Was the test performed? No .   Cognitive Function: Patient is alert and oriented x3.      06/10/2019    2:07 PM 04/22/2016    2:31 PM  MMSE - Mini Mental State Exam  Orientation to time 5 5  Orientation to Place 5 5  Registration 3 3  Attention/ Calculation 5 0  Recall 3 3  Language- name 2 objects  0  Language- repeat 1 1  Language- follow 3 step command  3  Language- read & follow direction  0  Write a sentence  0  Copy design  0  Total score  20        05/31/2022    3:26 PM  6CIT Screen  Months in reverse 0 points    Immunizations Immunization History  Administered Date(s) Administered   Fluad Quad(high Dose 65+) 02/14/2020, 02/15/2021   Influenza,inj,Quad PF,6+ Mos 05/24/2015, 04/03/2016, 03/09/2018   Influenza-Unspecified 03/29/2013, 04/11/2014, 04/03/2016, 03/17/2017   PFIZER(Purple Top)SARS-COV-2 Vaccination 07/16/2019, 08/06/2019, 03/03/2020   Pneumococcal Conjugate-13 06/13/2014   Pneumococcal Polysaccharide-23 01/14/2011   TDAP status: Due, Education has been provided regarding the importance of this vaccine. Advised may receive this vaccine at local pharmacy or Health Dept. Aware to provide a copy of the vaccination record if obtained from local pharmacy or Health Dept. Verbalized acceptance and understanding.  Shingrix Completed?: No.    Education has been provided regarding the  importance of this vaccine. Patient has been advised to call insurance company to determine out of pocket expense if they have not yet received this vaccine. Advised may also receive vaccine at local pharmacy or Health Dept. Verbalized acceptance and understanding.  Screening Tests Health Maintenance  Topic Date Due   DTaP/Tdap/Td (1 - Tdap) Never done   Diabetic kidney evaluation - Urine ACR  04/22/2017   MAMMOGRAM  11/14/2020   HEMOGLOBIN A1C  05/31/2022   COVID-19 Vaccine (4 - 2023-24 season) 06/16/2022 (Originally 02/01/2022)   Zoster Vaccines- Shingrix (1 of 2) 08/30/2022 (Originally 12/09/1953)   INFLUENZA VACCINE  09/01/2022 (Originally 01/01/2022)   Diabetic kidney evaluation - eGFR measurement  01/02/2023   OPHTHALMOLOGY EXAM  03/04/2023   FOOT EXAM  04/05/2023   Medicare Annual Wellness (AWV)  06/01/2023   Pneumonia Vaccine 41+ Years old  Completed   DEXA SCAN  Completed   HPV VACCINES  Aged Out   Health Maintenance Health Maintenance Due  Topic Date Due   DTaP/Tdap/Td (1 - Tdap) Never done   Diabetic kidney evaluation - Urine ACR  04/22/2017   MAMMOGRAM  11/14/2020   HEMOGLOBIN A1C  05/31/2022   Mammogram- Followed by Rob Hickman. Deferred per patient preference.  Lung Cancer Screening: (Low Dose CT Chest recommended if Age 22-80 years, 30 pack-year currently smoking OR have quit w/in 15years.) does not qualify.   Hepatitis C Screening: does not qualify.  Vision Screening: Recommended annual ophthalmology exams for early detection of glaucoma and other disorders of the eye.  Dental Screening: Recommended annual dental exams for proper oral hygiene  Community Resource Referral / Chronic Care Management: CRR required this visit?  No   CCM required this visit?  No      Plan:     I have personally reviewed and noted the following in the patient's chart:   Medical and social history Use of alcohol, tobacco or illicit drugs  Current medications and supplements including  opioid prescriptions. Patient is currently taking opioid prescriptions. Information provided to patient regarding non-opioid alternatives. Patient advised to discuss non-opioid treatment plan with their provider. Taking tramadol. Followed by PCP.  Functional ability and status Nutritional status Physical activity Advanced directives List of other physicians Hospitalizations, surgeries, and ER visits in previous 12 months Vitals Screenings to include cognitive, depression, and falls Referrals and appointments  In addition, I have reviewed and discussed with patient certain preventive protocols, quality metrics, and best practice recommendations. A written personalized care plan for preventive services as well as general preventive health recommendations were provided to patient.     Leta Jungling, LPN   [id number]

## 2022-05-31 NOTE — Patient Instructions (Addendum)
[name] , Thank you for taking time to come for your Medicare Wellness Visit. I appreciate your ongoing commitment to your health goals. Please review the following plan we discussed and let me know if I can assist you in the future.   These are the goals we discussed:  Goals Addressed             This Visit's Progress    Patient Stated       Would like to be more active.         This is a list of the screening recommended for you and due dates:  Health Maintenance  Topic Date Due   DTaP/Tdap/Td vaccine (1 - Tdap) Never done   Yearly kidney health urinalysis for diabetes  04/22/2017   Mammogram  11/14/2020   Hemoglobin A1C  05/31/2022   COVID-19 Vaccine (4 - 2023-24 season) 06/16/2022*   Zoster (Shingles) Vaccine (1 of 2) 08/30/2022*   Flu Shot  09/01/2022*   Yearly kidney function blood test for diabetes  01/02/2023   Eye exam for diabetics  03/04/2023   Complete foot exam   04/05/2023   Medicare Annual Wellness Visit  06/01/2023   Pneumonia Vaccine  Completed   DEXA scan (bone density measurement)  Completed   HPV Vaccine  Aged Out  *Topic was postponed. The date shown is not the original due date.    Advanced directives: End of life planning; Advance aging; Advanced directives discussed.  Copy of current HCPOA/Living Will requested.    Conditions/risks identified: none new  Next appointment: Follow up in one year for your annual wellness visit    Preventive Care 65 Years and Older, Female Preventive care refers to lifestyle choices and visits with your health care provider that can promote health and wellness. What does preventive care include? A yearly physical exam. This is also called an annual well check. Dental exams once or twice a year. Routine eye exams. Ask your health care provider how often you should have your eyes checked. Personal lifestyle choices, including: Daily care of your teeth and gums. Regular physical activity. Eating a healthy  diet. Avoiding tobacco and drug use. Limiting alcohol use. Practicing safe sex. Taking low-dose aspirin every day. Taking vitamin and mineral supplements as recommended by your health care provider. What happens during an annual well check? The services and screenings done by your health care provider during your annual well check will depend on your age, overall health, lifestyle risk factors, and family history of disease. Counseling  Your health care provider may ask you questions about your: Alcohol use. Tobacco use. Drug use. Emotional well-being. Home and relationship well-being. Sexual activity. Eating habits. History of falls. Memory and ability to understand (cognition). Work and work Statistician. Reproductive health. Screening  You may have the following tests or measurements: Height, weight, and BMI. Blood pressure. Lipid and cholesterol levels. These may be checked every 5 years, or more frequently if you are over 17 years old. Skin check. Lung cancer screening. You may have this screening every year starting at age 33 if you have a 30-pack-year history of smoking and currently smoke or have quit within the past 15 years. Fecal occult blood test (FOBT) of the stool. You may have this test every year starting at age 67. Flexible sigmoidoscopy or colonoscopy. You may have a sigmoidoscopy every 5 years or a colonoscopy every 10 years starting at age 51. Hepatitis C blood test. Hepatitis B blood test. Sexually transmitted disease (STD)  testing. Diabetes screening. This is done by checking your blood sugar (glucose) after you have not eaten for a while (fasting). You may have this done every 1-3 years. Bone density scan. This is done to screen for osteoporosis. You may have this done starting at age 13. Mammogram. This may be done every 1-2 years. Talk to your health care provider about how often you should have regular mammograms. Talk with your health care provider about  your test results, treatment options, and if necessary, the need for more tests. Vaccines  Your health care provider may recommend certain vaccines, such as: Influenza vaccine. This is recommended every year. Tetanus, diphtheria, and acellular pertussis (Tdap, Td) vaccine. You may need a Td booster every 10 years. Zoster vaccine. You may need this after age 85. Pneumococcal 13-valent conjugate (PCV13) vaccine. One dose is recommended after age 52. Pneumococcal polysaccharide (PPSV23) vaccine. One dose is recommended after age 69. Talk to your health care provider about which screenings and vaccines you need and how often you need them. This information is not intended to replace advice given to you by your health care provider. Make sure you discuss any questions you have with your health care provider. Document Released: 06/16/2015 Document Revised: 02/07/2016 Document Reviewed: 03/21/2015 Elsevier Interactive Patient Education  2017 [place] Prevention in the Home Falls can cause injuries. They can happen to people of all ages. There are many things you can do to make your home safe and to help prevent falls. What can I do on the outside of my home? Regularly fix the edges of walkways and driveways and fix any cracks. Remove anything that might make you trip as you walk through a door, such as a raised step or threshold. Trim any bushes or trees on the path to your home. Use bright outdoor lighting. Clear any walking paths of anything that might make someone trip, such as rocks or tools. Regularly check to see if handrails are loose or broken. Make sure that both sides of any steps have handrails. Any raised decks and porches should have guardrails on the edges. Have any leaves, snow, or ice cleared regularly. Use sand or salt on walking paths during winter. Clean up any spills in your garage right away. This includes oil or grease spills. What can I do in the bathroom? Use  night lights. Install grab bars by the toilet and in the tub and shower. Do not use towel bars as grab bars. Use non-skid mats or decals in the tub or shower. If you need to sit down in the shower, use a plastic, non-slip stool. Keep the floor dry. Clean up any water that spills on the floor as soon as it happens. Remove soap buildup in the tub or shower regularly. Attach bath mats securely with double-sided non-slip rug tape. Do not have throw rugs and other things on the floor that can make you trip. What can I do in the bedroom? Use night lights. Make sure that you have a light by your bed that is easy to reach. Do not use any sheets or blankets that are too big for your bed. They should not hang down onto the floor. Have a firm chair that has side arms. You can use this for support while you get dressed. Do not have throw rugs and other things on the floor that can make you trip. What can I do in the kitchen? Clean up any spills right away. Avoid walking on wet  floors. Keep items that you use a lot in easy-to-reach places. If you need to reach something above you, use a strong step stool that has a grab bar. Keep electrical cords out of the way. Do not use floor polish or wax that makes floors slippery. If you must use wax, use non-skid floor wax. Do not have throw rugs and other things on the floor that can make you trip. What can I do with my stairs? Do not leave any items on the stairs. Make sure that there are handrails on both sides of the stairs and use them. Fix handrails that are broken or loose. Make sure that handrails are as long as the stairways. Check any carpeting to make sure that it is firmly attached to the stairs. Fix any carpet that is loose or worn. Avoid having throw rugs at the top or bottom of the stairs. If you do have throw rugs, attach them to the floor with carpet tape. Make sure that you have a light switch at the top of the stairs and the bottom of the  stairs. If you do not have them, ask someone to add them for you. What else can I do to help prevent falls? Wear shoes that: Do not have high heels. Have rubber bottoms. Are comfortable and fit you well. Are closed at the toe. Do not wear sandals. If you use a stepladder: Make sure that it is fully opened. Do not climb a closed stepladder. Make sure that both sides of the stepladder are locked into place. Ask someone to hold it for you, if possible. Clearly mark and make sure that you can see: Any grab bars or handrails. First and last steps. Where the edge of each step is. Use tools that help you move around (mobility aids) if they are needed. These include: Canes. Walkers. Scooters. Crutches. Turn on the lights when you go into a dark area. Replace any light bulbs as soon as they burn out. Set up your furniture so you have a clear path. Avoid moving your furniture around. If any of your floors are uneven, fix them. If there are any pets around you, be aware of where they are. Review your medicines with your doctor. Some medicines can make you feel dizzy. This can increase your chance of falling. Ask your doctor what other things that you can do to help prevent falls. This information is not intended to replace advice given to you by your health care provider. Make sure you discuss any questions you have with your health care provider. Document Released: 03/16/2009 Document Revised: 10/26/2015 Document Reviewed: 06/24/2014 Elsevier Interactive Patient Education  2017 [place]. Opioid Pain Medicine Management Opioids are powerful medicines that are used to treat moderate to severe pain. When used for short periods of time, they can help you to: Sleep better. Do better in physical or occupational therapy. Feel better in the first few days after an injury. Recover from surgery. Opioids should be taken with the supervision of a trained health care provider. They should be taken  for the shortest period of time possible. This is because opioids can be addictive, and the longer you take opioids, the greater your risk of addiction. This addiction can also be called opioid use disorder. What are the risks? Using opioid pain medicines for longer than 3 days increases your risk of side effects. Side effects include: Constipation. Nausea and vomiting. Breathing difficulties (respiratory depression). Drowsiness. Confusion. Opioid use disorder. Itching. Taking opioid pain  medicine for a long period of time can affect your ability to do daily tasks. It also puts you at risk for: Motor vehicle crashes. Depression. Suicide. Heart attack. Overdose, which can be life-threatening. What is a pain treatment plan? A pain treatment plan is an agreement between you and your health care provider. Pain is unique to each person, and treatments vary depending on your condition. To manage your pain, you and your health care provider need to work together. To help you do this: Discuss the goals of your treatment, including how much pain you might expect to have and how you will manage the pain. Review the risks and benefits of taking opioid medicines. Remember that a good treatment plan uses more than one approach and minimizes the chance of side effects. Be honest about the amount of medicines you take and about any drug or alcohol use. Get pain medicine prescriptions from only one health care provider. Pain can be managed with many types of alternative treatments. Ask your health care provider to refer you to one or more specialists who can help you manage pain through: Physical or occupational therapy. Counseling (cognitive behavioral therapy). Good nutrition. Biofeedback. Massage. Meditation. Non-opioid medicine. Following a gentle exercise program. How to use opioid pain medicine Taking medicine Take your pain medicine exactly as told by your health care provider. Take it only  when you need it. If your pain gets less severe, you may take less than your prescribed dose if your health care provider approves. If you are not having pain, do nottake pain medicine unless your health care provider tells you to take it. If your pain is severe, do nottry to treat it yourself by taking more pills than instructed on your prescription. Contact your health care provider for help. Write down the times when you take your pain medicine. It is easy to become confused while on pain medicine. Writing the time can help you avoid overdose. Take other over-the-counter or prescription medicines only as told by your health care provider. Keeping yourself and others safe  While you are taking opioid pain medicine: Do not drive, use machinery, or power tools. Do not sign legal documents. Do not drink alcohol. Do not take sleeping pills. Do not supervise children by yourself. Do not do activities that require climbing or being in high places. Do not go to a lake, river, ocean, spa, or swimming pool. Do not share your pain medicine with anyone. Keep pain medicine in a locked cabinet or in a secure area where pets and children cannot reach it. Stopping your use of opioids If you have been taking opioid medicine for more than a few weeks, you may need to slowly decrease (taper) how much you take until you stop completely. Tapering your use of opioids can decrease your risk of symptoms of withdrawal, such as: Pain and cramping in the abdomen. Nausea. Sweating. Sleepiness. Restlessness. Uncontrollable shaking (tremors). Cravings for the medicine. Do not attempt to taper your use of opioids on your own. Talk with your health care provider about how to do this. Your health care provider may prescribe a step-down schedule based on how much medicine you are taking and how long you have been taking it. Getting rid of leftover pills Do not save any leftover pills. Get rid of leftover pills safely  by: Taking the medicine to a prescription take-back program. This is usually offered by the county or law enforcement. Bringing them to a pharmacy that has a drug disposal  container. Flushing them down the toilet. Check the label or package insert of your medicine to see whether this is safe to do. Throwing them out in the trash. Check the label or package insert of your medicine to see whether this is safe to do. If it is safe to throw it out, remove the medicine from the original container, put it into a sealable bag or container, and mix it with used coffee grounds, food scraps, dirt, or cat litter before putting it in the trash. Follow these instructions at home: Activity Do exercises as told by your health care provider. Avoid activities that make your pain worse. Return to your normal activities as told by your health care provider. Ask your health care provider what activities are safe for you. General instructions You may need to take these actions to prevent or treat constipation: Drink enough fluid to keep your urine pale yellow. Take over-the-counter or prescription medicines. Eat foods that are high in fiber, such as beans, whole grains, and fresh fruits and vegetables. Limit foods that are high in fat and processed sugars, such as fried or sweet foods. Keep all follow-up visits. This is important. Where to find support If you have been taking opioids for a long time, you may benefit from receiving support for quitting from a local support group or counselor. Ask your health care provider for a referral to these resources in your area. Where to find more information Centers for Disease Control and Prevention (CDC): http://www.wolf.info/ [place]. Food and Drug Administration (FDA): [place] Get help right away if: You may have taken too much of an opioid (overdosed). Common symptoms of an overdose: Your breathing is slower or more shallow than normal. You have a very slow heartbeat  (pulse). You have slurred speech. You have nausea and vomiting. Your pupils become very small. You have other potential symptoms: You are very confused. You faint or feel like you will faint. You have cold, clammy skin. You have blue lips or fingernails. You have thoughts of harming yourself or harming others. These symptoms may represent a serious problem that is an emergency. Do not wait to see if the symptoms will go away. Get medical help right away. Call your local emergency services (911 in the [place] Do not drive yourself to the hospital.  If you ever feel like you may hurt yourself or others, or have thoughts about taking your own life, get help right away. Go to your nearest emergency department or: Call your local emergency services (911 in the [place] Call the [facility] 224 690 4675 in the [place] Call a suicide crisis helpline, such as the [place] at (302)228-9787 or 988 in the [place]. This is open 24 hours a day in the [place] Text the Crisis Text Line at 949-149-5169 (in the [place].). Summary Opioid medicines can help you manage moderate to severe pain for a short period of time. A pain treatment plan is an agreement between you and your health care provider. Discuss the goals of your treatment, including how much pain you might expect to have and how you will manage the pain. If you think that you or someone else may have taken too much of an opioid, get medical help right away. This information is not intended to replace advice given to you by your health care provider. Make sure you discuss any questions you have with your health care provider. Document Revised: 12/13/2020 Document Reviewed: 08/30/2020 Elsevier Patient Education  [place].

## 2022-06-04 LAB — CBC AND DIFFERENTIAL
Hemoglobin: 11.7 — AB (ref 12.0–16.0)
Platelets: 262 10*3/uL (ref 150–400)

## 2022-06-05 ENCOUNTER — Telehealth: Payer: Self-pay

## 2022-06-05 DIAGNOSIS — C50919 Malignant neoplasm of unspecified site of unspecified female breast: Secondary | ICD-10-CM | POA: Diagnosis not present

## 2022-06-05 DIAGNOSIS — Z20822 Contact with and (suspected) exposure to covid-19: Secondary | ICD-10-CM | POA: Diagnosis not present

## 2022-06-05 DIAGNOSIS — R5383 Other fatigue: Secondary | ICD-10-CM | POA: Diagnosis not present

## 2022-06-05 DIAGNOSIS — Z170 Estrogen receptor positive status [ER+]: Secondary | ICD-10-CM | POA: Diagnosis not present

## 2022-06-05 DIAGNOSIS — C50811 Malignant neoplasm of overlapping sites of right female breast: Secondary | ICD-10-CM | POA: Diagnosis not present

## 2022-06-05 DIAGNOSIS — R918 Other nonspecific abnormal finding of lung field: Secondary | ICD-10-CM | POA: Diagnosis not present

## 2022-06-05 NOTE — Progress Notes (Signed)
   Care Guide Note  06/05/2022 Name: KARLYNN FURROW MRN: [id number] DOB: Aug 28, 1934  Referred by: Tower, [name], MD Reason for referral : Care Coordination (Outreach to transition to [facility] pharm d )   Oris Calmes Dehaas is a 88 y.o. year old female who is a primary care patient of Tower, [name], MD. Deneise Lever was referred to the pharmacist for assistance related to DM.    An unsuccessful telephone outreach was attempted today to contact the patient who was referred to the pharmacy team for assistance with medication management. Additional attempts will be made to contact the patient.   Noreene Larsson, [place], [place] [address] Direct Dial: [phone] [email or web address]

## 2022-06-06 ENCOUNTER — Ambulatory Visit (INDEPENDENT_AMBULATORY_CARE_PROVIDER_SITE_OTHER): Payer: Medicare HMO | Admitting: Family

## 2022-06-06 ENCOUNTER — Encounter: Payer: Self-pay | Admitting: Family

## 2022-06-06 VITALS — BP 128/62 | HR 70 | Temp 98.3°F | Ht 61.0 in | Wt 141.0 lb

## 2022-06-06 DIAGNOSIS — J020 Streptococcal pharyngitis: Secondary | ICD-10-CM | POA: Insufficient documentation

## 2022-06-06 DIAGNOSIS — J189 Pneumonia, unspecified organism: Secondary | ICD-10-CM

## 2022-06-06 MED ORDER — AZITHROMYCIN 250 MG PO TABS: ORAL_TABLET | ORAL | 0 refills | Status: AC

## 2022-06-06 MED ORDER — AMOXICILLIN-POT CLAVULANATE 875-125 MG PO TABS: 1.0000 | ORAL_TABLET | Freq: Two times a day (BID) | ORAL | 0 refills | Status: DC

## 2022-06-06 NOTE — Assessment & Plan Note (Signed)
Strep tested positive in office.  Already being treated with augmentin for probable pneumonia, this will cover strep as well.   Ibuprofen/tyelnol prn sore throat/fever Pt told to F/u if no improvement in the next 2-3 days.

## 2022-06-06 NOTE — Assessment & Plan Note (Addendum)
rx augmentin 875/125 mg po bid x 10 days Rx zpack 250 mg  Advised pt and husband if any altered mental status, worsening sob or wheezing and or increase fatigue/lethargy fever or chills seek more urgent care with either calling 911 and or going to ER. F/u with pcp x one week.  Repeat CXR in six weeks to monitor resolution.

## 2022-06-06 NOTE — Progress Notes (Signed)
Established Patient Office Visit  Subjective:  Patient ID: Morgan Hubbard, female    DOB: Dec 29, 1934  Age: 88 y.o. MRN: [id number]  CC:  Chief Complaint  Patient presents with   Cough    X67mo w/shob, wheezing; CXR 06/05/22 w/[facility]   Ear Fullness    R>L    HPI Morgan Hubbard with concerns.   Yesterday seen with oncology and xr with new airspace opacity right lung. Wbc recently checked as well and 16.5.   Did not get covid tested yesterday.   Over the last month not feeling well with increased fatigue.  Saw Dr. TGlori Bickersabout one month ago wand was dx with ear infection. Took amoxicillin with prednisone without any relief. Still with right sided ear pain and a sore throat. No fever or chills. With chest congestion, with wheezing, and sob.  Past Medical History:  Diagnosis Date   Arthritis    "hands" (01/13/2014)   Basal cell carcinoma 01/2014   "bridge of nose"   Cardiac LV ejection fraction >40%    "it was 43 last year" (01/13/2014)   Chronic lower back pain    CKD (chronic kidney disease), stage III ([place]    acute on chronic stage III/notes 06/10/2018   Colon polyps    Expressive aphasia    "3 times in the last week" (01/13/2014)   GERD (gastroesophageal reflux disease)    Goiter past remote   treated with RI   Hyperlipidemia    Hyperpotassemia    Hypertension    Hypertonicity of bladder    Inflammatory and toxic neuropathy, unspecified    LBBB (left bundle branch block)    Migraine    "stopped many years ago" (01/13/2014)   Mini stroke 01/2014   Other primary cardiomyopathies    Overactive bladder    Presence of combination internal cardiac defibrillator (ICD) and pacemaker    Reflex sympathetic dystrophy, unspecified    Shingles    Shortness of breath    ambulation   Stroke ([place]    Thyroid disease    Type II diabetes mellitus ([place]    Ulcer    Urine incontinence    Vertigo    hx of    Past Surgical History:  Procedure Laterality Date   BACK  SURGERY     BI-VENTRICULAR PACEMAKER INSERTION (CRT-P)  10/2014   [facility]   BREAST CYST EXCISION Left 1959   CARDIAC CATHETERIZATION  "several"   [place]   CATARACT EXTRACTION W/ INTRAOCULAR LENS IMPLANT Left ~ 2005   several eye injections   CATARACT EXTRACTION W/PHACO Right 05/04/2020   Procedure: CATARACT EXTRACTION PHACO AND INTRAOCULAR LENS PLACEMENT ([place] RIGHT DIABETIC;  Surgeon: PBirder Robson MD;  Location: [facility];  Service: Ophthalmology;  Laterality: Right;  [place]00:58.5 CDE 6.70 Fluid Pack [place] # [id number]   CHOLECYSTECTOMY N/A 06/13/2018   Procedure: LAPAROSCOPIC CHOLECYSTECTOMY;  Surgeon: TDonnie Mesa MD;  [place]  Service: General;  Laterality: N/A;   COLONOSCOPY  7/12   normal (hx of polyps in past)    CT SCAN  3/12   outside hosp- lung nodule and gallstones   ERCP N/A 06/12/2018   Procedure: ENDOSCOPIC RETROGRADE CHOLANGIOPANCREATOGRAPHY (ERCP);  Surgeon: HCarol Ada MD;  [place]  Service: Endoscopy;  Laterality: N/A;   [place]  "knot on index"   KIDNEY DONATION Left 1989   LUMBAR LAMINECTOMY/DECOMPRESSION MICRODISCECTOMY  1999   LUMBAR LAMINECTOMY/DECOMPRESSION MICRODISCECTOMY  01/31/2012  Procedure: LUMBAR LAMINECTOMY/DECOMPRESSION MICRODISCECTOMY 2 LEVELS;  Surgeon: Eustace Moore, MD;  Location: [place] [facility];  Service: Neurosurgery;  Laterality: N/A;  Thoracic twelve-lumbar one, lumbar one-two laminectomy    POSTERIOR LAMINECTOMY / [place]   REMOVAL OF STONES  06/12/2018   Procedure: REMOVAL OF STONES;  Surgeon: Carol Ada, MD;  Location: [place];  Service: Endoscopy;;   SPHINCTEROTOMY  06/12/2018   Procedure: Joan Mayans;  Surgeon: Carol Ada, MD;  Location: [place];  Service: Endoscopy;;   TUBAL LIGATION  1976   UPPER GASTROINTESTINAL ENDOSCOPY      Family History  Problem Relation Age of Onset   Arthritis Mother    Cancer Mother        uterine and mouth    Hyperlipidemia Mother    Stroke Mother    Hypertension Mother    Alcohol abuse Father    Diabetes Father    Cancer Sister        breast   Diabetes Sister    Cancer Brother        lung cancer   Kidney disease Brother    Diabetes Brother    Hyperlipidemia Sister    Heart disease Sister    Hypertension Sister    Diabetes Sister    Hyperlipidemia Brother    Kidney failure Brother    Diabetes Daughter    Addison's disease Other     Social History   Socioeconomic History   Marital status: Married    Spouse name: Not on file   Number of children: 2   Years of education: Not on file   Highest education level: Not on file  Occupational History   Occupation: retired - Production manager   Tobacco Use   Smoking status: Never   Smokeless tobacco: Never  Vaping Use   Vaping Use: Never used  Substance and Sexual Activity   Alcohol use: No    Alcohol/week: 0.0 standard drinks of alcohol   Drug use: No   Sexual activity: Yes  Other Topics Concern   Not on file  Social History Narrative   Married 2nd time   Daughter is Surveyor, minerals    Social Determinants of Health   Financial Resource Strain: Low Risk  (05/31/2022)   Overall Financial Resource Strain (CARDIA)    Difficulty of Paying Living Expenses: Not hard at all  Food Insecurity: No Food Insecurity (05/31/2022)   Hunger Vital Sign    Worried About Running Out of Food in the Last Year: Never true    Ran Out of Food in the Last Year: Never true  Transportation Needs: No Transportation Needs (05/31/2022)   PRAPARE - Hydrologist (Medical): No    Lack of Transportation (Non-Medical): No  Physical Activity: Inactive (05/30/2021)   Exercise Vital Sign    Days of Exercise per Week: 0 days    Minutes of Exercise per Session: 0 min  Stress: No Stress Concern Present (05/31/2022)   [place]    Feeling of Stress : Not at all  Social  Connections: Moderately Isolated (05/31/2022)   Social Connection and Isolation Panel [NHANES]    Frequency of Communication with Friends and Family: More than three times a week    Frequency of Social Gatherings with Friends and Family: Three times a week    Attends Religious Services: Never    Active Member of Clubs or Organizations: No    Attends Club  or Organization Meetings: Never    Marital Status: Married  Human resources officer Violence: Not At Risk (05/31/2022)   Humiliation, Afraid, Rape, and Kick questionnaire    Fear of Current or Ex-Partner: No    Emotionally Abused: No    Physically Abused: No    Sexually Abused: No    Outpatient Medications Prior to Visit  Medication Sig Dispense Refill   amLODipine (NORVASC) 2.5 MG tablet Take 2.5 mg by mouth at bedtime.     aspirin EC 81 MG tablet Take 1 tablet (81 mg total) by mouth daily. Swallow whole. 90 tablet 3   atorvastatin (LIPITOR) 80 MG tablet Take 1 tablet (80 mg total) by mouth daily. 90 tablet 3   carvedilol (COREG) 12.5 MG tablet Take 1 tablet (12.5 mg total) by mouth in the morning, at noon, and at bedtime. 270 tablet 3   cetirizine (ZYRTEC) 10 MG tablet Take 10 mg by mouth daily.     clotrimazole-betamethasone (LOTRISONE) cream Apply 1 application topically as needed (groin).      dapagliflozin propanediol (FARXIGA) 5 MG TABS tablet Take 5 mg by mouth daily.     Dulaglutide 3 MG/0.5ML SOPN Inject 0.5 mLs into the skin once a week.     famotidine (PEPCID) 20 MG tablet Take 1 tablet by mouth twice daily 60 tablet 5   gabapentin (NEURONTIN) 300 MG capsule Take 1 capsule (300 mg total) by mouth 3 (three) times daily. 270 capsule 3   glimepiride (AMARYL) 1 MG tablet Take 1-2 tablets (1-2 mg total) by mouth daily with breakfast. 60 tablet 1   hydrALAZINE (APRESOLINE) 50 MG tablet Take 1 tablet (50 mg total) by mouth 3 (three) times daily. 270 tablet 3   insulin degludec (TRESIBA) 100 UNIT/ML FlexTouch Pen Inject 26 Units into the  skin at bedtime.     isosorbide mononitrate (IMDUR) 60 MG 24 hr tablet Take 1 tablet (60 mg total) by mouth in the morning and at bedtime. 180 tablet 3   letrozole (FEMARA) 2.5 MG tablet Take 2.5 mg by mouth daily.     nystatin cream (MYCOSTATIN) Apply 1 application topically 2 (two) times daily as needed (irritation.).      pantoprazole (PROTONIX) 40 MG tablet Take 1 tablet by mouth once daily 90 tablet 1   traMADol (ULTRAM) 50 MG tablet TAKE 1 TO 2 TABLETS BY MOUTH EVERY 8 HOURS AS NEEDED FOR MODERATE TO SEVERE PAIN 60 tablet 0   [name] ([name]) 75 MG TABS Take 75 mg by mouth daily. 30 tablet 3   vitamin B-12 (CYANOCOBALAMIN) 1000 MCG tablet Take 1,000 mcg by mouth daily.      Vitamin D, Ergocalciferol, (DRISDOL) 1.25 MG (50000 UT) CAPS capsule Take 50,000 Units by mouth every 7 (seven) days.     No facility-administered medications prior to visit.    Allergies  Allergen Reactions   Ace Inhibitors Swelling    REACTION: tongue swelling   Lisinopril Swelling    Swelling of tongue   Insulin Detemir Itching   Nsaids Other (See Comments)    Renal issues   Codeine Nausea Only   Hydrochlorothiazide Other (See Comments)    Dizziness/funny feeling   Tylenol [Acetaminophen] Other (See Comments)    Fatty liver - use with caution        Objective:    Physical Exam Constitutional:      General: She is not in acute distress.    Appearance: Normal appearance. She is not ill-appearing.  HENT:  Right Ear: No middle ear effusion. Tympanic membrane is erythematous.     Left Ear: Tympanic membrane normal. There is impacted cerumen.     Ears:     Comments: Right ear with erythema    Nose: Mucosal edema and congestion present. No rhinorrhea.     Right Turbinates: Not enlarged or swollen.     Left Turbinates: Not enlarged or swollen.     Right Sinus: No maxillary sinus tenderness or frontal sinus tenderness.     Left Sinus: No maxillary sinus tenderness or frontal sinus tenderness.      Comments: Dried blood left nares     Mouth/Throat:     Mouth: Mucous membranes are moist.     Pharynx: No pharyngeal swelling, oropharyngeal exudate or posterior oropharyngeal erythema.     Tonsils: No tonsillar exudate.  Eyes:     Extraocular Movements: Extraocular movements intact.     Conjunctiva/sclera: Conjunctivae normal.     Pupils: Pupils are equal, round, and reactive to light.  Neck:     Thyroid: No thyroid mass.  Cardiovascular:     Rate and Rhythm: Normal rate and regular rhythm.  Pulmonary:     Effort: Pulmonary effort is normal.     Breath sounds: Examination of the right-upper field reveals wheezing. Wheezing present.     Comments: Slight inspiratory wheeze rul Lymphadenopathy:     Cervical:     Right cervical: No superficial cervical adenopathy.    Left cervical: No superficial cervical adenopathy.  Neurological:     Mental Status: She is alert.     BP 128/62   Pulse 70   Temp 98.3 F (36.8 C) (Oral)   Ht '5\' 1"'$  (1.549 m)   Wt 141 lb (64 kg)   LMP 06/03/1992   SpO2 97%   BMI 26.64 kg/m  Wt Readings from Last 3 Encounters:  06/06/22 141 lb (64 kg)  05/31/22 140 lb (63.5 kg)  05/23/22 140 lb 6 oz (63.7 kg)     Health Maintenance Due  Topic Date Due   DTaP/Tdap/Td (1 - Tdap) Never done   Diabetic kidney evaluation - Urine ACR  04/22/2017   MAMMOGRAM  11/14/2020   HEMOGLOBIN A1C  05/31/2022    There are no preventive care reminders to display for this patient.  Lab Results  Component Value Date   TSH 2.34 11/28/2021   Lab Results  Component Value Date   WBC 6.4 12/07/2021   HGB 11.0 (L) 12/07/2021   HCT 34.4 (L) 12/07/2021   MCV 95.0 12/07/2021   PLT 179 12/07/2021   Lab Results  Component Value Date   NA 140 01/01/2022   K 5.1 01/01/2022   CO2 31 01/01/2022   GLUCOSE 170 (H) 01/01/2022   BUN 30 (H) 01/01/2022   CREATININE 1.67 (H) 01/01/2022   BILITOT 1.0 11/29/2021   ALKPHOS 69 11/29/2021   AST 21 11/29/2021   ALT 16  11/29/2021   PROT 5.3 (L) 11/29/2021   ALBUMIN 2.8 (L) 11/29/2021   CALCIUM 8.8 01/01/2022   ANIONGAP 10 11/30/2021   GFR 27.85 (L) 01/01/2022   Lab Results  Component Value Date   HGBA1C 8.0 (H) 11/29/2021      Assessment & Plan:   Problem List Items Addressed This Visit       Respiratory   Strep pharyngitis    Strep tested positive in office.  Already being treated with augmentin for probable pneumonia, this will cover strep as well.   Ibuprofen/tyelnol prn sore  throat/fever Pt told to F/u if no improvement in the next 2-3 days.       Relevant Medications   azithromycin (ZITHROMAX) 250 MG tablet   Pneumonia of right upper lobe due to infectious organism - Primary    rx augmentin 875/125 mg po bid x 10 days Rx zpack 250 mg  Advised pt and husband if any altered mental status, worsening sob or wheezing and or increase fatigue/lethargy fever or chills seek more urgent care with either calling 911 and or going to ER. F/u with pcp x one week.  Repeat CXR in six weeks to monitor resolution.       Relevant Medications   amoxicillin-clavulanate (AUGMENTIN) 875-125 MG tablet   azithromycin (ZITHROMAX) 250 MG tablet    Meds ordered this encounter  Medications   amoxicillin-clavulanate (AUGMENTIN) 875-125 MG tablet    Sig: Take 1 tablet by mouth 2 (two) times daily.    Dispense:  20 tablet    Refill:  0    Order Specific Question:   Supervising Provider    Answer:   BEDSOLE, AMY E [[id number]]   azithromycin (ZITHROMAX) 250 MG tablet    Sig: Take 2 tablets on day 1, then 1 tablet daily on days 2 through 5    Dispense:  6 tablet    Refill:  0    Order Specific Question:   Supervising Provider    Answer:   Diona Browner, AMY E [[id number]]    Follow-up: Return in about 1 week (around 06/13/2022) for f/u with Dr. [name] her pcp in one week .    Eugenia Pancoast, FNP

## 2022-06-12 ENCOUNTER — Telehealth: Payer: Medicare HMO

## 2022-06-12 ENCOUNTER — Other Ambulatory Visit: Payer: Self-pay | Admitting: Family Medicine

## 2022-06-12 DIAGNOSIS — N318 Other neuromuscular dysfunction of bladder: Secondary | ICD-10-CM

## 2022-06-14 ENCOUNTER — Encounter: Payer: Self-pay | Admitting: Family Medicine

## 2022-06-14 ENCOUNTER — Ambulatory Visit (INDEPENDENT_AMBULATORY_CARE_PROVIDER_SITE_OTHER): Payer: Medicare HMO | Admitting: Family Medicine

## 2022-06-14 VITALS — BP 152/70 | HR 78 | Temp 97.3°F | Ht 61.0 in | Wt 143.2 lb

## 2022-06-14 DIAGNOSIS — J189 Pneumonia, unspecified organism: Secondary | ICD-10-CM | POA: Diagnosis not present

## 2022-06-14 DIAGNOSIS — I10 Essential (primary) hypertension: Secondary | ICD-10-CM

## 2022-06-14 DIAGNOSIS — J020 Streptococcal pharyngitis: Secondary | ICD-10-CM | POA: Diagnosis not present

## 2022-06-14 DIAGNOSIS — H6991 Unspecified Eustachian tube disorder, right ear: Secondary | ICD-10-CM | POA: Diagnosis not present

## 2022-06-14 DIAGNOSIS — H9201 Otalgia, right ear: Secondary | ICD-10-CM | POA: Diagnosis not present

## 2022-06-14 NOTE — Progress Notes (Unsigned)
Subjective:    Patient ID: Morgan Hubbard, female    DOB: 1934-06-04, 88 y.o.   MRN: [id number]  HPI Pt presents for f/u of pneumonia   Wt Readings from Last 3 Encounters:  06/14/22 143 lb 4 oz (65 kg)  06/06/22 141 lb (64 kg)  05/31/22 140 lb (63.5 kg)   27.07 kg/m  Vitals:   06/14/22 1017  BP: (!) 152/70  Pulse: 78  Temp: (!) 97.3 F (36.3 C)  TempSrc: Temporal  SpO2: 93%  Weight: 143 lb 4 oz (65 kg)  Height: '5\' 1"'$  (1.549 m)    Seen by Np Dugal on 1/4 for pna and strep throat  Noted new airspace opacity R lung the day prior at onc  Wbc was up to 16.5 Noted fatigue , chest congestion and chronic r ear pain   Tx with augmentin and zpak  Rad report from [facility] Results - X-ray chest PA and lateral (06/05/2022 2:17 PM EST) Procedure Note  Tessa Lerner, MD - 06/05/2022  Formatting of this note might be different from the original. PROCEDURE: Two-view (Posteroanterior and lateral projections) chest.  INDICATION: breast cancer staging, R53.83 Other fatigue  COMPARISON: 11/19/2021  FINDINGS/IMPRESSION:  1. Mediastinal and cardiac contours are within normal limits of size. 2. New airspace opacity in the RIGHT lung and infrahilar RIGHT lung may represent infection. Recommend follow-up chest radiograph in 6 weeks to document resolution. 3. No large effusion or pneumothorax. 4. Degenerative changes of the visualized spine. 5. Unchanged LEFT chest wall triple lead ICD. MRI clearance - No radiographic evidence of pacemaker/ICD lead fracture or displacement. No abandoned or retained pacemaker/ICD lead fragments.    Of note covid test wa neg   Onc recommended hospital treatment and pt declined   Today Is getting better overall  Still has a rattle when she coughs   Not very short of breath  Throat is better   R ear still bothers her a lot  Crackles and harder to hear out of it  She could not get in with Dr Pryor Ochoa- was booked up  Wants ENT  referral   Is tolerating abx well  Has to break the pills in 2   Patient Active Problem List   Diagnosis Date Noted   Pneumonia of right upper lobe due to infectious organism 06/06/2022   Vaginal irritation 05/23/2022   Right ear pain 05/10/2022   Headache 04/17/2022   Pain due to onychomycosis of toenails of both feet 04/04/2022   Near syncope 11/29/2021   Hypotension 11/29/2021   Generalized weakness 11/28/2021   Encounter for medication review 11/28/2021   Nausea 11/05/2021   Shoulder pain 04/25/2021   History of breast cancer 06/18/2020   Anemia 06/16/2020   Elevated TSH 06/16/2020   Occipital neuralgia 02/14/2020   Hair loss 11/09/2018   Leg weakness, bilateral 06/29/2018   Pedal edema 06/22/2018   AICD (automatic cardioverter/defibrillator) present 06/11/2018   GERD (gastroesophageal reflux disease) 06/10/2018   AKI (acute kidney injury) ([place]) 06/10/2018   Cholecystitis 06/10/2018   Tingling 10/06/2017   Chronic back pain 24/26/8341   Chronic systolic (congestive) heart failure ([place]) 02/25/2017   Osteopenia 06/16/2016   Routine general medical examination at a health care facility 04/24/2016   Estrogen deficiency 04/24/2016   Urticaria 11/03/2015   Cerumen impaction 03/10/2015   Encounter for Medicare annual wellness exam 06/13/2014   Stroke, small vessel ([place]) 01/18/2014   Family history of hemochromatosis 01/18/2014   Cerebral thrombosis with cerebral infarction ([place])  01/14/2014   Expressive aphasia 01/13/2014   Nonischemic cardiomyopathy ([place]) 04/05/2013   Shortness of breath 09/03/2012   ETD (Eustachian tube dysfunction), right 06/16/2012   Elevated transaminase level 05/18/2012   Spinal stenosis of lumbar region 12/10/2011   Mixed incontinence urge and stress 12/10/2011   Renal insufficiency 11/04/2011   Goiter 01/14/2011   Fatty liver 08/28/2010   Type II diabetes mellitus with renal manifestations ([place]) 07/09/2010   Hyperlipidemia associated with type  2 diabetes mellitus ([place]) 07/09/2010   Hyperkalemia 07/09/2010   REFLEX SYMPATHETIC DYSTROPHY 07/09/2010   Neuropathy 07/09/2010   Essential hypertension 07/09/2010   CARDIOMYOPATHY 07/09/2010   OVERACTIVE BLADDER 07/09/2010   Past Medical History:  Diagnosis Date   Arthritis    "hands" (01/13/2014)   Basal cell carcinoma 01/2014   "bridge of nose"   Cardiac LV ejection fraction >40%    "it was 43 last year" (01/13/2014)   Chronic lower back pain    CKD (chronic kidney disease), stage III ([place])    acute on chronic stage III/notes 06/10/2018   Colon polyps    Expressive aphasia    "3 times in the last week" (01/13/2014)   GERD (gastroesophageal reflux disease)    Goiter past remote   treated with RI   Hyperlipidemia    Hyperpotassemia    Hypertension    Hypertonicity of bladder    Inflammatory and toxic neuropathy, unspecified    LBBB (left bundle branch block)    Migraine    "stopped many years ago" (01/13/2014)   Mini stroke 01/2014   Other primary cardiomyopathies    Overactive bladder    Presence of combination internal cardiac defibrillator (ICD) and pacemaker    Reflex sympathetic dystrophy, unspecified    Shingles    Shortness of breath    ambulation   Stroke ([place])    Thyroid disease    Type II diabetes mellitus ([place])    Ulcer    Urine incontinence    Vertigo    hx of   Past Surgical History:  Procedure Laterality Date   BACK SURGERY     BI-VENTRICULAR PACEMAKER INSERTION (CRT-P)  10/2014   [facility]   BREAST CYST EXCISION Left 1959   CARDIAC CATHETERIZATION  "several"   [place]   CATARACT EXTRACTION W/ INTRAOCULAR LENS IMPLANT Left ~ 2005   several eye injections   CATARACT EXTRACTION W/PHACO Right 05/04/2020   Procedure: CATARACT EXTRACTION PHACO AND INTRAOCULAR LENS PLACEMENT ([place]) RIGHT DIABETIC;  Surgeon: Birder Robson, MD;  Location: [facility];  Service: Ophthalmology;  Laterality: Right;  [place] 00:58.5 CDE 6.70 Fluid Pack [place] # [id number]    CHOLECYSTECTOMY N/A 06/13/2018   Procedure: LAPAROSCOPIC CHOLECYSTECTOMY;  Surgeon: Donnie Mesa, MD;  Location: [facility];  Service: General;  Laterality: N/A;   COLONOSCOPY  7/12   normal (hx of polyps in past)    CT SCAN  3/12   outside hosp- lung nodule and gallstones   ERCP N/A 06/12/2018   Procedure: ENDOSCOPIC RETROGRADE CHOLANGIOPANCREATOGRAPHY (ERCP);  Surgeon: Carol Ada, MD;  Location: [facility];  Service: Endoscopy;  Laterality: N/A;   [place]   "knot on index"   KIDNEY DONATION Left 1989   LUMBAR LAMINECTOMY/DECOMPRESSION MICRODISCECTOMY  1999   LUMBAR LAMINECTOMY/DECOMPRESSION MICRODISCECTOMY  01/31/2012   Procedure: LUMBAR LAMINECTOMY/DECOMPRESSION MICRODISCECTOMY 2 LEVELS;  Surgeon: Eustace Moore, MD;  Location: [facility];  Service: Neurosurgery;  Laterality: N/A;  Thoracic twelve-lumbar one, lumbar one-two laminectomy    POSTERIOR LAMINECTOMY /  DECOMPRESSION CERVICAL SPINE  1999   REMOVAL OF STONES  06/12/2018   Procedure: REMOVAL OF STONES;  Surgeon: Carol Ada, MD;  Location: [facility];  Service: Endoscopy;;   SPHINCTEROTOMY  06/12/2018   Procedure: Joan Mayans;  Surgeon: Carol Ada, MD;  Location: [facility];  Service: Endoscopy;;   TUBAL LIGATION  1976   UPPER GASTROINTESTINAL ENDOSCOPY     Social History   Tobacco Use   Smoking status: Never   Smokeless tobacco: Never  Vaping Use   Vaping Use: Never used  Substance Use Topics   Alcohol use: No    Alcohol/week: 0.0 standard drinks of alcohol   Drug use: No   Family History  Problem Relation Age of Onset   Arthritis Mother    Cancer Mother        uterine and mouth   Hyperlipidemia Mother    Stroke Mother    Hypertension Mother    Alcohol abuse Father    Diabetes Father    Cancer Sister        breast   Diabetes Sister    Cancer Brother        lung cancer   Kidney disease Brother    Diabetes Brother    Hyperlipidemia Sister    Heart disease Sister     Hypertension Sister    Diabetes Sister    Hyperlipidemia Brother    Kidney failure Brother    Diabetes Daughter    Addison's disease Other    Allergies  Allergen Reactions   Ace Inhibitors Swelling    REACTION: tongue swelling   Lisinopril Swelling    Swelling of tongue   Insulin Detemir Itching   Nsaids Other (See Comments)    Renal issues   Codeine Nausea Only   Hydrochlorothiazide Other (See Comments)    Dizziness/funny feeling   Tylenol [Acetaminophen] Other (See Comments)    Fatty liver - use with caution   Current Outpatient Medications on File Prior to Visit  Medication Sig Dispense Refill   amLODipine (NORVASC) 2.5 MG tablet Take 2.5 mg by mouth at bedtime.     aspirin EC 81 MG tablet Take 1 tablet (81 mg total) by mouth daily. Swallow whole. 90 tablet 3   atorvastatin (LIPITOR) 80 MG tablet Take 1 tablet (80 mg total) by mouth daily. 90 tablet 3   carvedilol (COREG) 12.5 MG tablet Take 1 tablet (12.5 mg total) by mouth in the morning, at noon, and at bedtime. 270 tablet 3   cetirizine (ZYRTEC) 10 MG tablet Take 10 mg by mouth daily.     clotrimazole-betamethasone (LOTRISONE) cream Apply 1 application topically as needed (groin).      dapagliflozin propanediol (FARXIGA) 5 MG TABS tablet Take 5 mg by mouth daily.     Dulaglutide 3 MG/0.5ML SOPN Inject 0.5 mLs into the skin once a week.     famotidine (PEPCID) 20 MG tablet Take 1 tablet by mouth twice daily 60 tablet 5   gabapentin (NEURONTIN) 300 MG capsule Take 1 capsule (300 mg total) by mouth 3 (three) times daily. 270 capsule 3   [name] 75 MG TABS Take 1 tablet by mouth once daily 30 tablet 0   glimepiride (AMARYL) 1 MG tablet Take 1-2 tablets (1-2 mg total) by mouth daily with breakfast. 60 tablet 1   hydrALAZINE (APRESOLINE) 50 MG tablet Take 1 tablet (50 mg total) by mouth 3 (three) times daily. 270 tablet 3   insulin degludec (TRESIBA) 100 UNIT/ML FlexTouch Pen Inject 26 Units  into the skin at bedtime.      isosorbide mononitrate (IMDUR) 60 MG 24 hr tablet Take 1 tablet (60 mg total) by mouth in the morning and at bedtime. 180 tablet 3   letrozole (FEMARA) 2.5 MG tablet Take 2.5 mg by mouth daily.     nystatin cream (MYCOSTATIN) Apply 1 application topically 2 (two) times daily as needed (irritation.).      pantoprazole (PROTONIX) 40 MG tablet Take 1 tablet by mouth once daily 90 tablet 1   traMADol (ULTRAM) 50 MG tablet TAKE 1 TO 2 TABLETS BY MOUTH EVERY 8 HOURS AS NEEDED FOR MODERATE TO SEVERE PAIN 60 tablet 0   vitamin B-12 (CYANOCOBALAMIN) 1000 MCG tablet Take 1,000 mcg by mouth daily.      Vitamin D, Ergocalciferol, (DRISDOL) 1.25 MG (50000 UT) CAPS capsule Take 50,000 Units by mouth every 7 (seven) days.     No current facility-administered medications on file prior to visit.       Review of Systems  Constitutional:  Positive for fatigue. Negative for activity change, appetite change, fever and unexpected weight change.  HENT:  Positive for ear pain, hearing loss and postnasal drip. Negative for congestion, ear discharge, facial swelling, rhinorrhea, sinus pressure, sore throat, trouble swallowing and voice change.   Eyes:  Negative for pain, redness and visual disturbance.  Respiratory:  Positive for cough. Negative for shortness of breath, wheezing and stridor.   Cardiovascular:  Negative for chest pain and palpitations.  Gastrointestinal:  Negative for abdominal pain, blood in stool, constipation and diarrhea.  Endocrine: Negative for polydipsia and polyuria.  Genitourinary:  Negative for dysuria, frequency and urgency.  Musculoskeletal:  Negative for arthralgias, back pain and myalgias.  Skin:  Negative for pallor and rash.  Allergic/Immunologic: Negative for environmental allergies.  Neurological:  Negative for dizziness, syncope and headaches.  Hematological:  Negative for adenopathy. Does not bruise/bleed easily.  Psychiatric/Behavioral:  Negative for decreased concentration and  dysphoric mood. The patient is not nervous/anxious.        Objective:   Physical Exam Constitutional:      General: She is not in acute distress.    Appearance: Normal appearance. She is well-developed and normal weight. She is not ill-appearing or diaphoretic.     Comments: Appears fatigued but not ill  HENT:     Head: Normocephalic and atraumatic.     Right Ear: Tympanic membrane, ear canal and external ear normal.     Left Ear: Tympanic membrane, ear canal and external ear normal.     Nose:     Comments: Boggy nares  Eyes:     General:        Right eye: No discharge.        Left eye: No discharge.     Conjunctiva/sclera: Conjunctivae normal.     Pupils: Pupils are equal, round, and reactive to light.  Neck:     Thyroid: No thyromegaly.     Vascular: No carotid bruit or JVD.  Cardiovascular:     Rate and Rhythm: Normal rate and regular rhythm.     Heart sounds: Normal heart sounds.     No gallop.  Pulmonary:     Effort: Pulmonary effort is normal. No respiratory distress.     Breath sounds: Normal breath sounds. No stridor. No wheezing, rhonchi or rales.     Comments: Few scant rales in R mid lung field  Not sob with speech or ambulation  Chest:     Chest wall:  No tenderness.  Abdominal:     General: There is no distension or abdominal bruit.     Palpations: Abdomen is soft.  Musculoskeletal:     Cervical back: Normal range of motion and neck supple. No tenderness.     Right lower leg: No edema.     Left lower leg: No edema.  Lymphadenopathy:     Cervical: No cervical adenopathy.  Skin:    General: Skin is warm and dry.     Coloration: Skin is not pale.     Findings: No rash.  Neurological:     Mental Status: She is alert.     Cranial Nerves: No cranial nerve deficit.     Coordination: Coordination normal.     Deep Tendon Reflexes: Reflexes are normal and symmetric. Reflexes normal.     Comments: Generalized weakness (not focal)  Psychiatric:        Mood and  Affect: Mood normal.           Assessment & Plan:   Problem List Items Addressed This Visit       Cardiovascular and Mediastinum   Essential hypertension    Bp is up in setting of pna today F/u in a week to re check No change in medicines          Respiratory   Pneumonia of right upper lobe due to infectious organism - Primary    Reviewed notes and plan from NP Dugal  Reviewed CXR from onc and their notes as well  Clinical improvement so far with augmentin and zpak which she is finishing  She declined hospitalization after dx  Pulse ox is 93% after ambulation today  Reassuring exam Weak as expected - disc gradual return to regular activity and disc imp of better protein calorie intake  Plan f/u for a week  She may benefit from [facility] PT at that time- wants to discuss then as opposed to a referral now  ER precautions disc in detail      RESOLVED: Strep pharyngitis    Clinically resolved after tx with augmentin         Nervous and Auditory   ETD (Eustachian tube dysfunction), right    This began with OM which is resolved on exam  (originally tx with amox and prednisone)  Still some discomfort and muffled hearing  She desires ENT referral   Also h/o occip neuralgia but no headache today  Disc sympt care and ER precautions She is currently being tx for CAP      Relevant Orders   Ambulatory referral to ENT     Other   Right ear pain    Started with OM H/o occip neuralgia in past   Ref to ENT      Relevant Orders   Ambulatory referral to ENT

## 2022-06-14 NOTE — Patient Instructions (Signed)
Finish your antibiotics   Take care of yourself  Drink fluids and rest  Gradually increase your activity as tolerated  Take it slow   Let's talk about some home health next week when we see you back  You may need some physical therapy to get energy back after the pneumonia   Keep working on your protein intake including the shakes Eat regularly even when you are not hungry   If your symptoms worsen let us know  If severe go to the hospital   I will work on an ENT referral for the ear You will get a call back

## 2022-06-16 ENCOUNTER — Encounter: Payer: Self-pay | Admitting: Family Medicine

## 2022-06-16 NOTE — Assessment & Plan Note (Signed)
This began with OM which is resolved on exam  (originally tx with amox and prednisone)  Still some discomfort and muffled hearing  She desires ENT referral   Also h/o occip neuralgia but no headache today  Disc sympt care and ER precautions She is currently being tx for CAP

## 2022-06-16 NOTE — Assessment & Plan Note (Signed)
Started with OM H/o occip neuralgia in past   Ref to ENT

## 2022-06-16 NOTE — Assessment & Plan Note (Signed)
Reviewed notes and plan from NP Dugal  Reviewed CXR from onc and their notes as well  Clinical improvement so far with augmentin and zpak which she is finishing  She declined hospitalization after dx  Pulse ox is 93% after ambulation today  Reassuring exam Weak as expected - disc gradual return to regular activity and disc imp of better protein calorie intake  Plan f/u for a week  She may benefit from [facility] PT at that time- wants to discuss then as opposed to a referral now  ER precautions disc in detail

## 2022-06-16 NOTE — Assessment & Plan Note (Signed)
Clinically resolved after tx with augmentin

## 2022-06-16 NOTE — Assessment & Plan Note (Signed)
Bp is up in setting of pna today F/u in a week to re check No change in medicines

## 2022-06-17 NOTE — Telephone Encounter (Signed)
I think the goes to endocrinology

## 2022-06-17 NOTE — Telephone Encounter (Signed)
?  If PCP fills this or if pt still has endo doc

## 2022-06-18 NOTE — Progress Notes (Signed)
   Care Guide Note  06/18/2022 Name: Morgan Hubbard MRN: [id number] DOB: 03-Feb-1935  Referred by: Tower, [name], MD Reason for referral : Care Coordination (Outreach to transition to [facility] pharm d )   Morgan Hubbard is a 88 y.o. year old female who is a primary care patient of Tower, [name], MD. Morgan Hubbard was referred to the pharmacist for assistance related to DM.    Successful contact was made with the patient to discuss pharmacy services including being ready for the pharmacist to call at least 5 minutes before the scheduled appointment time, to have medication bottles and any blood sugar or blood pressure readings ready for review. The patient agreed to meet with the pharmacist via with the pharmacist via telephone visit on (date/time).  07/11/2022  Morgan Hubbard, [name], [name] [address] Direct Dial: [phone] [email or web address]

## 2022-06-21 ENCOUNTER — Encounter: Payer: Self-pay | Admitting: Family Medicine

## 2022-06-21 ENCOUNTER — Ambulatory Visit (INDEPENDENT_AMBULATORY_CARE_PROVIDER_SITE_OTHER): Payer: Medicare HMO | Admitting: Family Medicine

## 2022-06-21 VITALS — BP 135/58 | HR 75 | Temp 97.3°F | Ht 61.0 in | Wt 144.2 lb

## 2022-06-21 DIAGNOSIS — R531 Weakness: Secondary | ICD-10-CM

## 2022-06-21 DIAGNOSIS — J189 Pneumonia, unspecified organism: Secondary | ICD-10-CM | POA: Diagnosis not present

## 2022-06-21 DIAGNOSIS — I10 Essential (primary) hypertension: Secondary | ICD-10-CM | POA: Diagnosis not present

## 2022-06-21 DIAGNOSIS — H903 Sensorineural hearing loss, bilateral: Secondary | ICD-10-CM | POA: Diagnosis not present

## 2022-06-21 DIAGNOSIS — H6063 Unspecified chronic otitis externa, bilateral: Secondary | ICD-10-CM | POA: Diagnosis not present

## 2022-06-21 DIAGNOSIS — H9121 Sudden idiopathic hearing loss, right ear: Secondary | ICD-10-CM | POA: Diagnosis not present

## 2022-06-21 NOTE — Assessment & Plan Note (Signed)
Much clinical improvement  Appetite is better   Ref [facility] PT to get strength back  Reassuring exam   Will plan re check cxr in early feb  Inst to call if worse and rev ER precautions

## 2022-06-21 NOTE — Assessment & Plan Note (Signed)
With deconditioning after bout of com aquired pna  She tires easily  Poor balance Using cane  Unable to leave house w/o assist   [facility] for PT referral done today to eval and tx

## 2022-06-21 NOTE — Progress Notes (Signed)
Subjective:    Patient ID: Morgan Hubbard, female    DOB: 01-Jan-1935, 88 y.o.   MRN: [id number]  HPI Pt presents for re check of pneumonia   Wt Readings from Last 3 Encounters:  06/21/22 144 lb 4 oz (65.4 kg)  06/14/22 143 lb 4 oz (65 kg)  06/06/22 141 lb (64 kg)   27.26 kg/m Wt is up a lb Appetite is better    Vitals:   06/21/22 0927  BP: (!) 146/72  Pulse: 75  Temp: (!) 97.3 F (36.3 C)  SpO2: 93%   Much better today  Cough is almost gone No breathing problems  No fever   Still weak No energy  Open to PT     Original xray showing R lung infiltrate was on 1/3  Last visit starting to feel better  Took augmentin and zpak  Pusle ox is 93% after ambulation today   Was ref to ENT for r ear issue  Has appt with Dr Pryor Ochoa this afternoon  HTN bp is stable today  No cp or palpitations or headaches or edema  No side effects to medicines  BP Readings from Last 3 Encounters:  06/21/22 (!) 135/58  06/14/22 (!) 152/70  06/06/22 128/62      Lab Results  Component Value Date   CREATININE 1.67 (H) 01/01/2022   BUN 30 (H) 01/01/2022   NA 140 01/01/2022   K 5.1 01/01/2022   CL 104 01/01/2022   CO2 31 01/01/2022   Lab Results  Component Value Date   WBC 6.4 12/07/2021   HGB 11.0 (L) 12/07/2021   HCT 34.4 (L) 12/07/2021   MCV 95.0 12/07/2021   PLT 179 12/07/2021    Patient Active Problem List   Diagnosis Date Noted   Pneumonia of right upper lobe due to infectious organism 06/06/2022   Vaginal irritation 05/23/2022   Right ear pain 05/10/2022   Headache 04/17/2022   Pain due to onychomycosis of toenails of both feet 04/04/2022   Near syncope 11/29/2021   Hypotension 11/29/2021   Generalized weakness 11/28/2021   Encounter for medication review 11/28/2021   Nausea 11/05/2021   Shoulder pain 04/25/2021   History of breast cancer 06/18/2020   Anemia 06/16/2020   Elevated TSH 06/16/2020   Occipital neuralgia 02/14/2020   Hair loss 11/09/2018    Leg weakness, bilateral 06/29/2018   Pedal edema 06/22/2018   AICD (automatic cardioverter/defibrillator) present 06/11/2018   GERD (gastroesophageal reflux disease) 06/10/2018   AKI (acute kidney injury) ([place]) 06/10/2018   Cholecystitis 06/10/2018   Tingling 10/06/2017   Chronic back pain 94/70/9628   Chronic systolic (congestive) heart failure ([place]) 02/25/2017   Osteopenia 06/16/2016   Routine general medical examination at a health care facility 04/24/2016   Estrogen deficiency 04/24/2016   Urticaria 11/03/2015   Cerumen impaction 03/10/2015   Encounter for Medicare annual wellness exam 06/13/2014   Stroke, small vessel ([place]) 01/18/2014   Family history of hemochromatosis 01/18/2014   Cerebral thrombosis with cerebral infarction ([place]) 01/14/2014   Expressive aphasia 01/13/2014   Nonischemic cardiomyopathy ([place]) 04/05/2013   Shortness of breath 09/03/2012   ETD (Eustachian tube dysfunction), right 06/16/2012   Elevated transaminase level 05/18/2012   Spinal stenosis of lumbar region 12/10/2011   Mixed incontinence urge and stress 12/10/2011   Renal insufficiency 11/04/2011   Goiter 01/14/2011   Fatty liver 08/28/2010   Type II diabetes mellitus with renal manifestations ([place]) 07/09/2010   Hyperlipidemia associated with type 2 diabetes mellitus ([place])  07/09/2010   Hyperkalemia 07/09/2010   REFLEX SYMPATHETIC DYSTROPHY 07/09/2010   Neuropathy 07/09/2010   Essential hypertension 07/09/2010   CARDIOMYOPATHY 07/09/2010   OVERACTIVE BLADDER 07/09/2010   Past Medical History:  Diagnosis Date   Arthritis    "hands" (01/13/2014)   Basal cell carcinoma 01/2014   "bridge of nose"   Cardiac LV ejection fraction >40%    "it was 43 last year" (01/13/2014)   Chronic lower back pain    CKD (chronic kidney disease), stage III ([place])    acute on chronic stage III/notes 06/10/2018   Colon polyps    Expressive aphasia    "3 times in the last week" (01/13/2014)   GERD (gastroesophageal reflux  disease)    Goiter past remote   treated with RI   Hyperlipidemia    Hyperpotassemia    Hypertension    Hypertonicity of bladder    Inflammatory and toxic neuropathy, unspecified    LBBB (left bundle branch block)    Migraine    "stopped many years ago" (01/13/2014)   Mini stroke 01/2014   Other primary cardiomyopathies    Overactive bladder    Presence of combination internal cardiac defibrillator (ICD) and pacemaker    Reflex sympathetic dystrophy, unspecified    Shingles    Shortness of breath    ambulation   Stroke ([place])    Thyroid disease    Type II diabetes mellitus ([place])    Ulcer    Urine incontinence    Vertigo    hx of   Past Surgical History:  Procedure Laterality Date   BACK SURGERY     BI-VENTRICULAR PACEMAKER INSERTION (CRT-P)  10/2014   [facility]   BREAST CYST EXCISION Left 1959   CARDIAC CATHETERIZATION  "several"   [place]   CATARACT EXTRACTION W/ INTRAOCULAR LENS IMPLANT Left ~ 2005   several eye injections   CATARACT EXTRACTION W/PHACO Right 05/04/2020   Procedure: CATARACT EXTRACTION PHACO AND INTRAOCULAR LENS PLACEMENT ([place]) RIGHT DIABETIC;  Surgeon: Birder Robson, MD;  Location: [facility];  Service: Ophthalmology;  Laterality: Right;  [place] 00:58.5 CDE 6.70 Fluid Pack [place] # [id number]   CHOLECYSTECTOMY N/A 06/13/2018   Procedure: LAPAROSCOPIC CHOLECYSTECTOMY;  Surgeon: Donnie Mesa, MD;  Location: [facility];  Service: General;  Laterality: N/A;   COLONOSCOPY  7/12   normal (hx of polyps in past)    CT SCAN  3/12   outside hosp- lung nodule and gallstones   ERCP N/A 06/12/2018   Procedure: ENDOSCOPIC RETROGRADE CHOLANGIOPANCREATOGRAPHY (ERCP);  Surgeon: Carol Ada, MD;  Location: [facility];  Service: Endoscopy;  Laterality: N/A;   [place]   "knot on index"   KIDNEY DONATION Left 1989   LUMBAR LAMINECTOMY/DECOMPRESSION MICRODISCECTOMY  1999   LUMBAR LAMINECTOMY/DECOMPRESSION MICRODISCECTOMY  01/31/2012   Procedure: LUMBAR  LAMINECTOMY/DECOMPRESSION MICRODISCECTOMY 2 LEVELS;  Surgeon: Eustace Moore, MD;  Location: [place] [facility]O [facility];  Service: Neurosurgery;  Laterality: N/A;  Thoracic twelve-lumbar one, lumbar one-two laminectomy    POSTERIOR LAMINECTOMY / [place]   REMOVAL OF STONES  06/12/2018   Procedure: REMOVAL OF STONES;  Surgeon: Carol Ada, MD;  Location: [facility] END[facility]COPY;  Service: Endoscopy;;   SPHINCTEROTOMY  06/12/2018   Procedure: Joan Mayans;  Surgeon: Carol Ada, MD;  Location: [facility];  Service: Endoscopy;;   TUBAL LIGATION  1976   UPPER GASTROINTESTINAL ENDOSCOPY     Social History   Tobacco Use   Smoking status: Never   Smokeless tobacco: Never  Vaping Use   Vaping Use: Never used  Substance Use Topics   Alcohol use: No    Alcohol/week: 0.0 standard drinks of alcohol   Drug use: No   Family History  Problem Relation Age of Onset   Arthritis Mother    Cancer Mother        uterine and mouth   Hyperlipidemia Mother    Stroke Mother    Hypertension Mother    Alcohol abuse Father    Diabetes Father    Cancer Sister        breast   Diabetes Sister    Cancer Brother        lung cancer   Kidney disease Brother    Diabetes Brother    Hyperlipidemia Sister    Heart disease Sister    Hypertension Sister    Diabetes Sister    Hyperlipidemia Brother    Kidney failure Brother    Diabetes Daughter    Addison's disease Other    Allergies  Allergen Reactions   Ace Inhibitors Swelling    REACTION: tongue swelling   Lisinopril Swelling    Swelling of tongue   Insulin Detemir Itching   Nsaids Other (See Comments)    Renal issues   Codeine Nausea Only   Hydrochlorothiazide Other (See Comments)    Dizziness/funny feeling   Tylenol [Acetaminophen] Other (See Comments)    Fatty liver - use with caution   Current Outpatient Medications on File Prior to Visit  Medication Sig Dispense Refill   amLODipine (NORVASC) 2.5 MG tablet Take 2.5 mg by mouth at  bedtime.     aspirin EC 81 MG tablet Take 1 tablet (81 mg total) by mouth daily. Swallow whole. 90 tablet 3   atorvastatin (LIPITOR) 80 MG tablet Take 1 tablet (80 mg total) by mouth daily. 90 tablet 3   carvedilol (COREG) 12.5 MG tablet Take 1 tablet (12.5 mg total) by mouth in the morning, at noon, and at bedtime. 270 tablet 3   cetirizine (ZYRTEC) 10 MG tablet Take 10 mg by mouth daily.     clotrimazole-betamethasone (LOTRISONE) cream Apply 1 application topically as needed (groin).      dapagliflozin propanediol (FARXIGA) 5 MG TABS tablet Take 5 mg by mouth daily.     Dulaglutide 3 MG/0.5ML SOPN Inject 0.5 mLs into the skin once a week.     famotidine (PEPCID) 20 MG tablet Take 1 tablet by mouth twice daily 60 tablet 5   gabapentin (NEURONTIN) 300 MG capsule Take 1 capsule (300 mg total) by mouth 3 (three) times daily. 270 capsule 3   [name] 75 MG TABS Take 1 tablet by mouth once daily 30 tablet 0   glimepiride (AMARYL) 1 MG tablet Take 1-2 tablets (1-2 mg total) by mouth daily with breakfast. 60 tablet 1   hydrALAZINE (APRESOLINE) 50 MG tablet Take 1 tablet (50 mg total) by mouth 3 (three) times daily. 270 tablet 3   insulin degludec (TRESIBA) 100 UNIT/ML FlexTouch Pen Inject 26 Units into the skin at bedtime.     isosorbide mononitrate (IMDUR) 60 MG 24 hr tablet Take 1 tablet (60 mg total) by mouth in the morning and at bedtime. 180 tablet 3   letrozole (FEMARA) 2.5 MG tablet Take 2.5 mg by mouth daily.     nystatin cream (MYCOSTATIN) Apply 1 application topically 2 (two) times daily as needed (irritation.).      pantoprazole (PROTONIX) 40 MG tablet Take 1 tablet by mouth once daily 90  tablet 1   traMADol (ULTRAM) 50 MG tablet TAKE 1 TO 2 TABLETS BY MOUTH EVERY 8 HOURS AS NEEDED FOR MODERATE TO SEVERE PAIN 60 tablet 0   vitamin B-12 (CYANOCOBALAMIN) 1000 MCG tablet Take 1,000 mcg by mouth daily.      Vitamin D, Ergocalciferol, (DRISDOL) 1.25 MG (50000 UT) CAPS capsule Take 50,000 Units by  mouth every 7 (seven) days.     No current facility-administered medications on file prior to visit.     Review of Systems  Constitutional:  Positive for fatigue. Negative for activity change, appetite change, fever and unexpected weight change.  HENT:  Negative for congestion, ear pain, rhinorrhea, sinus pressure and sore throat.   Eyes:  Negative for pain, redness and visual disturbance.  Respiratory:  Positive for cough. Negative for shortness of breath and wheezing.        Much less cough  Cardiovascular:  Negative for chest pain and palpitations.  Gastrointestinal:  Negative for abdominal pain, blood in stool, constipation and diarrhea.  Endocrine: Negative for polydipsia and polyuria.  Genitourinary:  Negative for dysuria, frequency and urgency.  Musculoskeletal:  Negative for arthralgias, back pain and myalgias.  Skin:  Negative for pallor and rash.  Allergic/Immunologic: Negative for environmental allergies.  Neurological:  Positive for weakness. Negative for dizziness, syncope and headaches.  Hematological:  Negative for adenopathy. Does not bruise/bleed easily.  Psychiatric/Behavioral:  Negative for decreased concentration and dysphoric mood. The patient is not nervous/anxious.        Objective:   Physical Exam Constitutional:      General: She is not in acute distress.    Appearance: Normal appearance. She is well-developed and normal weight. She is not ill-appearing or diaphoretic.  HENT:     Head: Normocephalic and atraumatic.  Eyes:     General:        Right eye: No discharge.        Left eye: No discharge.     Conjunctiva/sclera: Conjunctivae normal.     Pupils: Pupils are equal, round, and reactive to light.  Neck:     Thyroid: No thyromegaly.     Vascular: No carotid bruit or JVD.  Cardiovascular:     Rate and Rhythm: Normal rate and regular rhythm.     Heart sounds: Normal heart sounds.     No gallop.  Pulmonary:     Effort: Pulmonary effort is normal.  No respiratory distress.     Breath sounds: Normal breath sounds. No stridor. No wheezing, rhonchi or rales.     Comments: Harsh bs at bases No rales or rhonchi or crackles  Abdominal:     General: There is no distension or abdominal bruit.     Palpations: Abdomen is soft.  Musculoskeletal:     Cervical back: Normal range of motion and neck supple.     Right lower leg: No edema.     Left lower leg: No edema.  Lymphadenopathy:     Cervical: No cervical adenopathy.  Skin:    General: Skin is warm and dry.     Coloration: Skin is not pale.     Findings: No rash.  Neurological:     Mental Status: She is alert.     Coordination: Coordination normal.     Deep Tendon Reflexes: Reflexes are normal and symmetric. Reflexes normal.     Comments: No focal weakness Walks with cane  Needs some help getting up     Psychiatric:  Mood and Affect: Mood normal.           Assessment & Plan:   Problem List Items Addressed This Visit       Cardiovascular and Mediastinum   Essential hypertension    Bp is improved  F/u in a week to re check No change in medicines   BP: (!) 135/58   Amlodipine 2.5 mg daily  Coreg 12.5 g tid Imdur 60 mg bid  Under cardiology care         Respiratory   Pneumonia of right upper lobe due to infectious organism - Primary    Much clinical improvement  Appetite is better   Ref [facility] PT to get strength back  Reassuring exam   Will plan re check cxr in early feb  Inst to call if worse and rev ER precautions         Other   Generalized weakness    With deconditioning after bout of com aquired pna  She tires easily  Poor balance Using cane  Unable to leave house w/o assist   [facility] for PT referral done today to eval and tx      Relevant Orders   Ambulatory referral to [place]

## 2022-06-21 NOTE — Assessment & Plan Note (Addendum)
Bp is improved  F/u in a week to re check No change in medicines   BP: (!) 135/58   Amlodipine 2.5 mg daily  Coreg 12.5 g tid Imdur 60 mg bid  Under cardiology care

## 2022-06-21 NOTE — Patient Instructions (Addendum)
I'm glad you are feeling better   Keep eating regular meals Advance your activity slowly as tolerated  I will do a home health referral for home physical therapy to get you stronger If you don't get a call in the next week let us know    If you worsen or any symptoms come back call us or go to ER   I want to get another chest xray in early February

## 2022-06-24 DIAGNOSIS — G44221 Chronic tension-type headache, intractable: Secondary | ICD-10-CM | POA: Diagnosis not present

## 2022-06-25 ENCOUNTER — Telehealth: Payer: Self-pay | Admitting: Family Medicine

## 2022-06-25 NOTE — Telephone Encounter (Signed)
Morgan Hubbard from [facility] called over and stated that there will be a delay in the start of her PT. They haven't been able to reach her to set it up. Thank you!

## 2022-06-25 NOTE — Telephone Encounter (Signed)
Thanks- please let pt know they are trying to reach her - perhaps she does not answer an unknown number (can let husband know as well)

## 2022-06-27 ENCOUNTER — Telehealth: Payer: Self-pay | Admitting: Family Medicine

## 2022-06-27 DIAGNOSIS — H6063 Unspecified chronic otitis externa, bilateral: Secondary | ICD-10-CM | POA: Diagnosis not present

## 2022-06-27 DIAGNOSIS — M48061 Spinal stenosis, lumbar region without neurogenic claudication: Secondary | ICD-10-CM | POA: Diagnosis not present

## 2022-06-27 DIAGNOSIS — Z9181 History of falling: Secondary | ICD-10-CM | POA: Diagnosis not present

## 2022-06-27 DIAGNOSIS — N183 Chronic kidney disease, stage 3 unspecified: Secondary | ICD-10-CM | POA: Diagnosis not present

## 2022-06-27 DIAGNOSIS — Z7982 Long term (current) use of aspirin: Secondary | ICD-10-CM | POA: Diagnosis not present

## 2022-06-27 DIAGNOSIS — I959 Hypotension, unspecified: Secondary | ICD-10-CM | POA: Diagnosis not present

## 2022-06-27 DIAGNOSIS — Z7984 Long term (current) use of oral hypoglycemic drugs: Secondary | ICD-10-CM | POA: Diagnosis not present

## 2022-06-27 DIAGNOSIS — Z7985 Long-term (current) use of injectable non-insulin antidiabetic drugs: Secondary | ICD-10-CM | POA: Diagnosis not present

## 2022-06-27 DIAGNOSIS — J189 Pneumonia, unspecified organism: Secondary | ICD-10-CM | POA: Diagnosis not present

## 2022-06-27 DIAGNOSIS — I428 Other cardiomyopathies: Secondary | ICD-10-CM | POA: Diagnosis not present

## 2022-06-27 DIAGNOSIS — E1140 Type 2 diabetes mellitus with diabetic neuropathy, unspecified: Secondary | ICD-10-CM | POA: Diagnosis not present

## 2022-06-27 DIAGNOSIS — E1169 Type 2 diabetes mellitus with other specified complication: Secondary | ICD-10-CM | POA: Diagnosis not present

## 2022-06-27 DIAGNOSIS — I130 Hypertensive heart and chronic kidney disease with heart failure and stage 1 through stage 4 chronic kidney disease, or unspecified chronic kidney disease: Secondary | ICD-10-CM | POA: Diagnosis not present

## 2022-06-27 DIAGNOSIS — J188 Other pneumonia, unspecified organism: Secondary | ICD-10-CM | POA: Diagnosis not present

## 2022-06-27 DIAGNOSIS — Z794 Long term (current) use of insulin: Secondary | ICD-10-CM | POA: Diagnosis not present

## 2022-06-27 DIAGNOSIS — E1122 Type 2 diabetes mellitus with diabetic chronic kidney disease: Secondary | ICD-10-CM | POA: Diagnosis not present

## 2022-06-27 DIAGNOSIS — E785 Hyperlipidemia, unspecified: Secondary | ICD-10-CM | POA: Diagnosis not present

## 2022-06-27 DIAGNOSIS — E1129 Type 2 diabetes mellitus with other diabetic kidney complication: Secondary | ICD-10-CM | POA: Diagnosis not present

## 2022-06-27 DIAGNOSIS — H903 Sensorineural hearing loss, bilateral: Secondary | ICD-10-CM | POA: Diagnosis not present

## 2022-06-27 DIAGNOSIS — I5022 Chronic systolic (congestive) heart failure: Secondary | ICD-10-CM | POA: Diagnosis not present

## 2022-06-27 DIAGNOSIS — D649 Anemia, unspecified: Secondary | ICD-10-CM | POA: Diagnosis not present

## 2022-06-27 DIAGNOSIS — N179 Acute kidney failure, unspecified: Secondary | ICD-10-CM | POA: Diagnosis not present

## 2022-06-27 DIAGNOSIS — K760 Fatty (change of) liver, not elsewhere classified: Secondary | ICD-10-CM | POA: Diagnosis not present

## 2022-06-27 NOTE — Telephone Encounter (Signed)
Home Health verbal orders Caller Name:Monique Agency Name: [facility] number: [phone]  Requesting OT/PT/Skilled nursing/Social Work/Speech:PT  Reason:  Frequency:1 week 1, 2 week 3, 1 week 3  Please forward to [facility] pool or providers CMA

## 2022-06-27 NOTE — Telephone Encounter (Signed)
Please ok those verbal orders  

## 2022-06-27 NOTE — Telephone Encounter (Signed)
They were able to reach pt

## 2022-06-28 NOTE — Telephone Encounter (Signed)
Left VM giving Morgan Hubbard the VO

## 2022-07-01 DIAGNOSIS — Z7982 Long term (current) use of aspirin: Secondary | ICD-10-CM | POA: Diagnosis not present

## 2022-07-01 DIAGNOSIS — E1122 Type 2 diabetes mellitus with diabetic chronic kidney disease: Secondary | ICD-10-CM | POA: Diagnosis not present

## 2022-07-01 DIAGNOSIS — Z7985 Long-term (current) use of injectable non-insulin antidiabetic drugs: Secondary | ICD-10-CM | POA: Diagnosis not present

## 2022-07-01 DIAGNOSIS — Z9181 History of falling: Secondary | ICD-10-CM | POA: Diagnosis not present

## 2022-07-01 DIAGNOSIS — I959 Hypotension, unspecified: Secondary | ICD-10-CM | POA: Diagnosis not present

## 2022-07-01 DIAGNOSIS — J188 Other pneumonia, unspecified organism: Secondary | ICD-10-CM | POA: Diagnosis not present

## 2022-07-01 DIAGNOSIS — I130 Hypertensive heart and chronic kidney disease with heart failure and stage 1 through stage 4 chronic kidney disease, or unspecified chronic kidney disease: Secondary | ICD-10-CM | POA: Diagnosis not present

## 2022-07-01 DIAGNOSIS — Z794 Long term (current) use of insulin: Secondary | ICD-10-CM | POA: Diagnosis not present

## 2022-07-01 DIAGNOSIS — M48061 Spinal stenosis, lumbar region without neurogenic claudication: Secondary | ICD-10-CM | POA: Diagnosis not present

## 2022-07-01 DIAGNOSIS — Z7984 Long term (current) use of oral hypoglycemic drugs: Secondary | ICD-10-CM | POA: Diagnosis not present

## 2022-07-01 DIAGNOSIS — N179 Acute kidney failure, unspecified: Secondary | ICD-10-CM | POA: Diagnosis not present

## 2022-07-01 DIAGNOSIS — K760 Fatty (change of) liver, not elsewhere classified: Secondary | ICD-10-CM | POA: Diagnosis not present

## 2022-07-01 DIAGNOSIS — E1140 Type 2 diabetes mellitus with diabetic neuropathy, unspecified: Secondary | ICD-10-CM | POA: Diagnosis not present

## 2022-07-01 DIAGNOSIS — N183 Chronic kidney disease, stage 3 unspecified: Secondary | ICD-10-CM | POA: Diagnosis not present

## 2022-07-01 DIAGNOSIS — E785 Hyperlipidemia, unspecified: Secondary | ICD-10-CM | POA: Diagnosis not present

## 2022-07-01 DIAGNOSIS — I5022 Chronic systolic (congestive) heart failure: Secondary | ICD-10-CM | POA: Diagnosis not present

## 2022-07-01 DIAGNOSIS — D649 Anemia, unspecified: Secondary | ICD-10-CM | POA: Diagnosis not present

## 2022-07-01 DIAGNOSIS — E1169 Type 2 diabetes mellitus with other specified complication: Secondary | ICD-10-CM | POA: Diagnosis not present

## 2022-07-01 DIAGNOSIS — E1129 Type 2 diabetes mellitus with other diabetic kidney complication: Secondary | ICD-10-CM | POA: Diagnosis not present

## 2022-07-01 DIAGNOSIS — I428 Other cardiomyopathies: Secondary | ICD-10-CM | POA: Diagnosis not present

## 2022-07-03 DIAGNOSIS — I10 Essential (primary) hypertension: Secondary | ICD-10-CM | POA: Diagnosis not present

## 2022-07-03 DIAGNOSIS — Z95810 Presence of automatic (implantable) cardiac defibrillator: Secondary | ICD-10-CM | POA: Diagnosis not present

## 2022-07-03 DIAGNOSIS — E782 Mixed hyperlipidemia: Secondary | ICD-10-CM | POA: Diagnosis not present

## 2022-07-03 DIAGNOSIS — I428 Other cardiomyopathies: Secondary | ICD-10-CM | POA: Diagnosis not present

## 2022-07-05 DIAGNOSIS — Z7985 Long-term (current) use of injectable non-insulin antidiabetic drugs: Secondary | ICD-10-CM | POA: Diagnosis not present

## 2022-07-05 DIAGNOSIS — E1122 Type 2 diabetes mellitus with diabetic chronic kidney disease: Secondary | ICD-10-CM | POA: Diagnosis not present

## 2022-07-05 DIAGNOSIS — Z9181 History of falling: Secondary | ICD-10-CM | POA: Diagnosis not present

## 2022-07-05 DIAGNOSIS — I959 Hypotension, unspecified: Secondary | ICD-10-CM | POA: Diagnosis not present

## 2022-07-05 DIAGNOSIS — Z7982 Long term (current) use of aspirin: Secondary | ICD-10-CM | POA: Diagnosis not present

## 2022-07-05 DIAGNOSIS — M48061 Spinal stenosis, lumbar region without neurogenic claudication: Secondary | ICD-10-CM | POA: Diagnosis not present

## 2022-07-05 DIAGNOSIS — N179 Acute kidney failure, unspecified: Secondary | ICD-10-CM | POA: Diagnosis not present

## 2022-07-05 DIAGNOSIS — J188 Other pneumonia, unspecified organism: Secondary | ICD-10-CM | POA: Diagnosis not present

## 2022-07-05 DIAGNOSIS — I428 Other cardiomyopathies: Secondary | ICD-10-CM | POA: Diagnosis not present

## 2022-07-05 DIAGNOSIS — Z794 Long term (current) use of insulin: Secondary | ICD-10-CM | POA: Diagnosis not present

## 2022-07-05 DIAGNOSIS — N183 Chronic kidney disease, stage 3 unspecified: Secondary | ICD-10-CM | POA: Diagnosis not present

## 2022-07-05 DIAGNOSIS — Z7984 Long term (current) use of oral hypoglycemic drugs: Secondary | ICD-10-CM | POA: Diagnosis not present

## 2022-07-05 DIAGNOSIS — I130 Hypertensive heart and chronic kidney disease with heart failure and stage 1 through stage 4 chronic kidney disease, or unspecified chronic kidney disease: Secondary | ICD-10-CM | POA: Diagnosis not present

## 2022-07-05 DIAGNOSIS — E1169 Type 2 diabetes mellitus with other specified complication: Secondary | ICD-10-CM | POA: Diagnosis not present

## 2022-07-05 DIAGNOSIS — K760 Fatty (change of) liver, not elsewhere classified: Secondary | ICD-10-CM | POA: Diagnosis not present

## 2022-07-05 DIAGNOSIS — E1129 Type 2 diabetes mellitus with other diabetic kidney complication: Secondary | ICD-10-CM | POA: Diagnosis not present

## 2022-07-05 DIAGNOSIS — E785 Hyperlipidemia, unspecified: Secondary | ICD-10-CM | POA: Diagnosis not present

## 2022-07-05 DIAGNOSIS — E1140 Type 2 diabetes mellitus with diabetic neuropathy, unspecified: Secondary | ICD-10-CM | POA: Diagnosis not present

## 2022-07-05 DIAGNOSIS — D649 Anemia, unspecified: Secondary | ICD-10-CM | POA: Diagnosis not present

## 2022-07-05 DIAGNOSIS — I5022 Chronic systolic (congestive) heart failure: Secondary | ICD-10-CM | POA: Diagnosis not present

## 2022-07-08 DIAGNOSIS — E1129 Type 2 diabetes mellitus with other diabetic kidney complication: Secondary | ICD-10-CM | POA: Diagnosis not present

## 2022-07-08 DIAGNOSIS — E1169 Type 2 diabetes mellitus with other specified complication: Secondary | ICD-10-CM | POA: Diagnosis not present

## 2022-07-08 DIAGNOSIS — E1122 Type 2 diabetes mellitus with diabetic chronic kidney disease: Secondary | ICD-10-CM | POA: Diagnosis not present

## 2022-07-08 DIAGNOSIS — Z794 Long term (current) use of insulin: Secondary | ICD-10-CM | POA: Diagnosis not present

## 2022-07-08 DIAGNOSIS — E785 Hyperlipidemia, unspecified: Secondary | ICD-10-CM | POA: Diagnosis not present

## 2022-07-08 DIAGNOSIS — R809 Proteinuria, unspecified: Secondary | ICD-10-CM | POA: Diagnosis not present

## 2022-07-08 DIAGNOSIS — E1165 Type 2 diabetes mellitus with hyperglycemia: Secondary | ICD-10-CM | POA: Diagnosis not present

## 2022-07-08 DIAGNOSIS — N184 Chronic kidney disease, stage 4 (severe): Secondary | ICD-10-CM | POA: Diagnosis not present

## 2022-07-09 DIAGNOSIS — E1169 Type 2 diabetes mellitus with other specified complication: Secondary | ICD-10-CM

## 2022-07-09 DIAGNOSIS — Z7984 Long term (current) use of oral hypoglycemic drugs: Secondary | ICD-10-CM | POA: Diagnosis not present

## 2022-07-09 DIAGNOSIS — Z794 Long term (current) use of insulin: Secondary | ICD-10-CM | POA: Diagnosis not present

## 2022-07-09 DIAGNOSIS — M48061 Spinal stenosis, lumbar region without neurogenic claudication: Secondary | ICD-10-CM | POA: Diagnosis not present

## 2022-07-09 DIAGNOSIS — D649 Anemia, unspecified: Secondary | ICD-10-CM | POA: Diagnosis not present

## 2022-07-09 DIAGNOSIS — Z9181 History of falling: Secondary | ICD-10-CM | POA: Diagnosis not present

## 2022-07-09 DIAGNOSIS — E1129 Type 2 diabetes mellitus with other diabetic kidney complication: Secondary | ICD-10-CM

## 2022-07-09 DIAGNOSIS — E785 Hyperlipidemia, unspecified: Secondary | ICD-10-CM | POA: Diagnosis not present

## 2022-07-09 DIAGNOSIS — E1122 Type 2 diabetes mellitus with diabetic chronic kidney disease: Secondary | ICD-10-CM | POA: Diagnosis not present

## 2022-07-09 DIAGNOSIS — Z7985 Long-term (current) use of injectable non-insulin antidiabetic drugs: Secondary | ICD-10-CM | POA: Diagnosis not present

## 2022-07-09 DIAGNOSIS — K760 Fatty (change of) liver, not elsewhere classified: Secondary | ICD-10-CM

## 2022-07-09 DIAGNOSIS — I428 Other cardiomyopathies: Secondary | ICD-10-CM

## 2022-07-09 DIAGNOSIS — N179 Acute kidney failure, unspecified: Secondary | ICD-10-CM

## 2022-07-09 DIAGNOSIS — I959 Hypotension, unspecified: Secondary | ICD-10-CM | POA: Diagnosis not present

## 2022-07-09 DIAGNOSIS — I5022 Chronic systolic (congestive) heart failure: Secondary | ICD-10-CM | POA: Diagnosis not present

## 2022-07-09 DIAGNOSIS — J188 Other pneumonia, unspecified organism: Secondary | ICD-10-CM | POA: Diagnosis not present

## 2022-07-09 DIAGNOSIS — N183 Chronic kidney disease, stage 3 unspecified: Secondary | ICD-10-CM | POA: Diagnosis not present

## 2022-07-09 DIAGNOSIS — Z7982 Long term (current) use of aspirin: Secondary | ICD-10-CM | POA: Diagnosis not present

## 2022-07-09 DIAGNOSIS — I130 Hypertensive heart and chronic kidney disease with heart failure and stage 1 through stage 4 chronic kidney disease, or unspecified chronic kidney disease: Secondary | ICD-10-CM | POA: Diagnosis not present

## 2022-07-09 DIAGNOSIS — E1140 Type 2 diabetes mellitus with diabetic neuropathy, unspecified: Secondary | ICD-10-CM

## 2022-07-10 ENCOUNTER — Other Ambulatory Visit: Payer: Self-pay | Admitting: Family Medicine

## 2022-07-10 DIAGNOSIS — N318 Other neuromuscular dysfunction of bladder: Secondary | ICD-10-CM

## 2022-07-10 NOTE — Telephone Encounter (Signed)
F/u was on 06/21/22, last filled on 06/12/22 #30 tabs 0 refill

## 2022-07-11 ENCOUNTER — Other Ambulatory Visit: Payer: Medicare HMO | Admitting: Pharmacist

## 2022-07-11 DIAGNOSIS — E1122 Type 2 diabetes mellitus with diabetic chronic kidney disease: Secondary | ICD-10-CM | POA: Diagnosis not present

## 2022-07-11 DIAGNOSIS — Z9181 History of falling: Secondary | ICD-10-CM | POA: Diagnosis not present

## 2022-07-11 DIAGNOSIS — Z794 Long term (current) use of insulin: Secondary | ICD-10-CM | POA: Diagnosis not present

## 2022-07-11 DIAGNOSIS — I959 Hypotension, unspecified: Secondary | ICD-10-CM | POA: Diagnosis not present

## 2022-07-11 DIAGNOSIS — Z7984 Long term (current) use of oral hypoglycemic drugs: Secondary | ICD-10-CM | POA: Diagnosis not present

## 2022-07-11 DIAGNOSIS — E785 Hyperlipidemia, unspecified: Secondary | ICD-10-CM | POA: Diagnosis not present

## 2022-07-11 DIAGNOSIS — E1129 Type 2 diabetes mellitus with other diabetic kidney complication: Secondary | ICD-10-CM | POA: Diagnosis not present

## 2022-07-11 DIAGNOSIS — Z7985 Long-term (current) use of injectable non-insulin antidiabetic drugs: Secondary | ICD-10-CM | POA: Diagnosis not present

## 2022-07-11 DIAGNOSIS — K760 Fatty (change of) liver, not elsewhere classified: Secondary | ICD-10-CM | POA: Diagnosis not present

## 2022-07-11 DIAGNOSIS — N183 Chronic kidney disease, stage 3 unspecified: Secondary | ICD-10-CM | POA: Diagnosis not present

## 2022-07-11 DIAGNOSIS — J188 Other pneumonia, unspecified organism: Secondary | ICD-10-CM | POA: Diagnosis not present

## 2022-07-11 DIAGNOSIS — N179 Acute kidney failure, unspecified: Secondary | ICD-10-CM | POA: Diagnosis not present

## 2022-07-11 DIAGNOSIS — E1169 Type 2 diabetes mellitus with other specified complication: Secondary | ICD-10-CM | POA: Diagnosis not present

## 2022-07-11 DIAGNOSIS — I130 Hypertensive heart and chronic kidney disease with heart failure and stage 1 through stage 4 chronic kidney disease, or unspecified chronic kidney disease: Secondary | ICD-10-CM | POA: Diagnosis not present

## 2022-07-11 DIAGNOSIS — D649 Anemia, unspecified: Secondary | ICD-10-CM | POA: Diagnosis not present

## 2022-07-11 DIAGNOSIS — I428 Other cardiomyopathies: Secondary | ICD-10-CM | POA: Diagnosis not present

## 2022-07-11 DIAGNOSIS — E1140 Type 2 diabetes mellitus with diabetic neuropathy, unspecified: Secondary | ICD-10-CM | POA: Diagnosis not present

## 2022-07-11 DIAGNOSIS — M48061 Spinal stenosis, lumbar region without neurogenic claudication: Secondary | ICD-10-CM | POA: Diagnosis not present

## 2022-07-11 DIAGNOSIS — I5022 Chronic systolic (congestive) heart failure: Secondary | ICD-10-CM | POA: Diagnosis not present

## 2022-07-11 DIAGNOSIS — Z7982 Long term (current) use of aspirin: Secondary | ICD-10-CM | POA: Diagnosis not present

## 2022-07-11 NOTE — Patient Instructions (Signed)
Sarrah,   It was great talking to you today.   Let me know when you have verified your total household income. If less than $78,880 per year, we can apply for manufacturer assistance for [place] and Ozempic (in place of Trulicity) from [employer], [employer].   Your pharmacy should be billing your [place] for [place]. I will call them before you are due for your next fill in April to ensure they have the information.   Unfortunately, there are no patient assistance programs for [employer]   We can talk to Dr. [name] about tapering and possibly eliminating either pantoprazole or famotidine at your next appointment. Call the office to schedule a follow up appointment with Dr. [name] this month.   Thanks!  Catie Hedwig Morton, PharmD, [place], [facility] [phone]

## 2022-07-11 NOTE — Progress Notes (Signed)
07/11/2022 Name: Morgan Hubbard MRN: [id number] DOB: Dec 17, 1934  Chief Complaint  Patient presents with   Medication Management   Diabetes   Hypertension   Hyperlipidemia    Morgan Hubbard is a 88 y.o. year old female who presented for a telephone visit.   They were referred to the pharmacist by their Case Management Team  for assistance in managing complex medication management.   Subjective:  Care Team: Primary Care Provider: Tower, [name], Morgan Hubbard ; Next Scheduled Visit: not scheduled Endocrinologist Morgan Hubbard; Next Scheduled Visit: 09/12/22  Medication Access/Adherence  Current Pharmacy:  [facility] [address], [place] [address] Phone: [phone] Fax: [phone]   Patient reports affordability concerns with their medications: Yes  Patient reports access/transportation concerns to their pharmacy: No  Patient reports adherence concerns with their medications:  No    Reports being frustrated with drug costs, doesn't like being on new, expensive medications. Husband is on acarbose (previous patient of Morgan Hubbard) and she wishes her providers would use more cost effective options for her as well.    Diabetes:  Current medications: Farxiga 5 mg daily, Trulicity 3 mg weekly (increased to 4.5 mg weekly by Morgan Hubbard last week, but patient would prefer to complete 3 mg supply first; Tresiba 26 units daily, glimepiride 2 mg daily Medications tried in the past: no metformin due to renal function   Using [employer]. Wondering if any support for the cost of that.   Reports some hypoglycemia periodically. Notes a variable dietary schedule.   Heart Failure:  Current medications:  ACEi/ARB/ARNI: none, renal function SGLT2i: Farxiga 5 mg daily Beta blocker: carvedilol 12.5 mg twice daily Mineralocorticoid Receptor Antagonist: none, renal function Diuretic regimen: none, renal function Additional antihypertensives: amlodipine  2.5 mg daily, hydralazine 50 mg three times daily, isosorbide 60 mg daily  Approved for [employer]; does not appear pharmacy used this grant funding with January fill. Will collaborate in late March/April to ensure they use this for this medication as well as carvedilol. Unfortunately, hydralazine, isosorbide do not appear to be on [employer] cardiomyopathy fund.   Current home blood pressure readings: reports her home PT checks periodically, home systolic ~ 676P  Denies lightheadedness, dizziness.   Hyperlipidemia/ASCVD Risk Reduction  Current lipid lowering medications: atorvastatin 80 mg daily  Antiplatelet regimen: aspirin 81 mg daily  GERD: Current medications: pantoprazole 40 mg daily, famotidine 20 mg twice daily - wonders about cost of pantoprazole, nodes it was $90 for 90 day. Wonders if she needs to be on both pantoprazole and famotidine. Notes a remote history of gastric/esophageal ulcer.   OAB: Current medications: [name] 75 mg daily   Objective:  Lab Results  Component Value Date   HGBA1C 8.0 (H) 11/29/2021    Lab Results  Component Value Date   CREATININE 1.67 (H) 01/01/2022   BUN 30 (H) 01/01/2022   NA 140 01/01/2022   K 5.1 01/01/2022   CL 104 01/01/2022   CO2 31 01/01/2022    Lab Results  Component Value Date   CHOL 99 06/12/2020   HDL 33.20 (L) 06/12/2020   LDLCALC 46 06/10/2019   LDLDIRECT 46.0 06/12/2020   TRIG 209.0 (H) 06/12/2020   CHOLHDL 3 06/12/2020    Medications Reviewed Today     Reviewed by Morgan Hubbard (Pharmacist) on 07/11/22 at 1006  Med List Status: <None>   Medication Order Taking? Sig Documenting Provider Last Dose  Status Informant  amLODipine (NORVASC) 2.5 MG tablet [id number] Yes Take 2.5 mg by mouth at bedtime. Morgan Mend, Morgan Hubbard Taking Active   aspirin EC 81 MG tablet [id number] Yes Take 1 tablet (81 mg total) by mouth daily. Swallow whole. Morgan Merritts, Morgan Hubbard  Taking Active Multiple Informants  atorvastatin (LIPITOR) 80 MG tablet [id number] Yes Take 1 tablet (80 mg total) by mouth daily. Morgan Merritts, Morgan Hubbard Taking Active   carvedilol (COREG) 12.5 MG tablet [id number] Yes Take 1 tablet (12.5 mg total) by mouth in the morning, at noon, and at bedtime. Morgan Merritts, Morgan Hubbard Taking Active   cetirizine (ZYRTEC) 10 MG tablet [id number] Yes Take 10 mg by mouth daily. Provider, Historical, Morgan Hubbard Taking Active Multiple Informants  clotrimazole-betamethasone (LOTRISONE) cream [id number]  Apply 1 application topically as needed (groin).  Provider, Historical, Morgan Hubbard  Active Multiple Informants           Med Note ([place]   Wed Jun 10, 2018  5:40 PM)    dapagliflozin propanediol (FARXIGA) 5 MG TABS tablet [id number] Yes Take 5 mg by mouth daily. Provider, Historical, Morgan Hubbard Taking Active   Dulaglutide 3 MG/0.5ML SOPN [id number] Yes Inject 0.5 mLs into the skin once a week. Provider, Historical, Morgan Hubbard Taking Active   famotidine (PEPCID) 20 MG tablet [id number] Yes Take 1 tablet by mouth twice daily Tower, [name], Morgan Hubbard Taking Active   gabapentin (NEURONTIN) 300 MG capsule [id number] Yes Take 1 capsule (300 mg total) by mouth 3 (three) times daily. Tower, [name], Morgan Hubbard Taking Active   glimepiride (AMARYL) 1 MG tablet [id number]  Take 1-2 tablets (1-2 mg total) by mouth daily with breakfast. Morgan G[place], Morgan Hubbard  Active   hydrALAZINE (APRESOLINE) 50 MG tablet [id number] Yes Take 1 tablet (50 mg total) by mouth 3 (three) times daily. Morgan Merritts, Morgan Hubbard Taking Active   insulin degludec (TRESIBA) 100 UNIT/ML FlexTouch Pen [id number] Yes Inject 26 Units into the skin at bedtime. Provider, Historical, Morgan Hubbard Taking Active   isosorbide mononitrate (IMDUR) 60 MG 24 hr tablet [id number] Yes Take 1 tablet (60 mg total) by mouth in the morning and at bedtime. Morgan Merritts, Morgan Hubbard Taking Active   letrozole [facility]) 2.5 MG tablet [id number] Yes Take 2.5 mg by mouth daily. Provider, Historical, Morgan Hubbard Taking  Active Multiple Informants  nystatin cream (MYCOSTATIN) [id number] Yes Apply 1 application topically 2 (two) times daily as needed (irritation.).  Provider, Historical, Morgan Hubbard Taking Active Multiple Informants  pantoprazole (PROTONIX) 40 MG tablet [id number] Yes Take 1 tablet by mouth once daily Tower, [name], Morgan Hubbard Taking Active Multiple Informants  traMADol (ULTRAM) 50 MG tablet [id number] Yes TAKE 1 TO 2 TABLETS BY MOUTH EVERY 8 HOURS AS NEEDED FOR MODERATE TO SEVERE PAIN Tower, [name], Morgan Hubbard Taking Active            Med Note Jodi Mourning, Jeroline Wolbert T   Thu Jul 11, 2022 10:06 AM) 1-2 times daily  [name] ([name]) 75 MG TABS [id number] Yes Take 1 tablet by mouth once daily Tower, [name], Morgan Hubbard Taking Active   vitamin B-12 (CYANOCOBALAMIN) 1000 MCG tablet [id number] Yes Take 1,000 mcg by mouth daily.  Provider, Historical, Morgan Hubbard Taking Active Multiple Informants  Vitamin D, Ergocalciferol, (DRISDOL) 1.25 MG (50000 UT) CAPS capsule [id number] Yes Take 50,000 Units by mouth every 7 (seven) days. Provider, Historical, Morgan Hubbard Taking Active Multiple Informants           Med Note Damita Dunnings, Elveria Rising   Fri Dec 07, 2021  12:51 PM)                Assessment/Plan:   Diabetes: - Currently uncontrolled - Discussed insulin/GLP1 patient assistance. Patient unsure if she meets income criteria, she will review household income and find out. If possible, can apply for [employer] assistance for [place] and Ozempic (Trulicity is not available on [employer] patient assistance at this time). Ozempic may also offer greater glucose lowering benefit.  - Discussed improved safety/benefit of GLP1/SGT2/basal insulins to patient. She remains frustrated at costs of newer medications.  - Recommend to continue to use Elenor Legato if able to continue to pay.  - Recommend to check glucose continuously.   Hyperlipidemia/ASCVD Risk Reduction: - Currently controlled.  - Recommend to continue current regimen   Heart Failure: - Currently appropriately  managed - Recommend to continue current regimen. Will collaborate with pharmacy in April with next fill of Farxiga to ensure [employer] is used.    GERD: - Controlled per patient report, but with desire to streamline therapy - Encouraged to discuss tapering PPI or tapering H2RA with PCP at next visit.   OAB: - Controlled but with cost concerns - Discussed that there are no patient assistance options for [name] at this time.   Follow up in ~ 6 weeks  Catie Hedwig Morton, PharmD, [place], [facility] [phone]

## 2022-07-12 ENCOUNTER — Ambulatory Visit (INDEPENDENT_AMBULATORY_CARE_PROVIDER_SITE_OTHER)
Admission: RE | Admit: 2022-07-12 | Discharge: 2022-07-12 | Disposition: A | Discharge status: Home / Self Care | Payer: Medicare HMO | Source: Ambulatory Visit | Attending: Family Medicine | Admitting: Family Medicine

## 2022-07-12 ENCOUNTER — Encounter: Payer: Self-pay | Admitting: Family Medicine

## 2022-07-12 ENCOUNTER — Ambulatory Visit (INDEPENDENT_AMBULATORY_CARE_PROVIDER_SITE_OTHER): Payer: Medicare HMO | Admitting: Family Medicine

## 2022-07-12 VITALS — BP 136/62 | HR 84 | Temp 97.2°F | Ht 61.0 in | Wt 145.5 lb

## 2022-07-12 DIAGNOSIS — Z8701 Personal history of pneumonia (recurrent): Secondary | ICD-10-CM | POA: Diagnosis not present

## 2022-07-12 DIAGNOSIS — J069 Acute upper respiratory infection, unspecified: Secondary | ICD-10-CM

## 2022-07-12 DIAGNOSIS — J189 Pneumonia, unspecified organism: Secondary | ICD-10-CM | POA: Diagnosis not present

## 2022-07-12 DIAGNOSIS — R918 Other nonspecific abnormal finding of lung field: Secondary | ICD-10-CM | POA: Diagnosis not present

## 2022-07-12 DIAGNOSIS — Z950 Presence of cardiac pacemaker: Secondary | ICD-10-CM | POA: Diagnosis not present

## 2022-07-12 NOTE — Assessment & Plan Note (Addendum)
Overall cough has improved (but this is complicated by viral head cold this week)  Pulse ox 93% and reassuring exam  PT for gen weakness, she is starting to improve   Cxr ordered  CXR clear-no opacity noted Some peribronchial thickening noted

## 2022-07-12 NOTE — Progress Notes (Signed)
Subjective:    Patient ID: Morgan Hubbard, female    DOB: 1934/12/27, 88 y.o.   MRN: [id number]  HPI Here for f/u of pna  Wt Readings from Last 3 Encounters:  07/12/22 145 lb 8 oz (66 kg)  06/21/22 144 lb 4 oz (65.4 kg)  06/14/22 143 lb 4 oz (65 kg)   27.49 kg/m  Vitals:   07/12/22 1222  BP: 136/62  Pulse: 84  Temp: (!) 97.2 F (36.2 C)  SpO2: 93%     Was seen 1/19 f/u of RUL pna  Was clinically imp  Ref to [facility] for PT   Getting some more stamina  Making slow progress   Still some head cold symptoms  this week  Nasal congestion and runny nose  No color to mucous  Hoarse voice  No sore throat  Cough is not bad  No wheezing    She did see ENT- Dr Pryor Ochoa for her R ear  Still problematic There is some nerve damage in it - trying to figure it out ? Cause  ? Tx options are  Will f/u in 2-3 weeks     GERD- disc with the pharmacist / would like to get off of some medication   Pepcid 20 mg bid - has indigestion if she does not take it  Protonix 40 mg daily   Appetite is fair  Does drink protein drinks   DG Chest 2 View  Result Date: 07/12/2022 CLINICAL DATA:  History of RIGHT UPPER lobe pneumonia. EXAM: CHEST - 2 VIEW COMPARISON:  No recent studies available for comparison. 11/29/2021 radiographs FINDINGS: Cardiomediastinal silhouette is unchanged. LEFT pacemaker/ICD again identified. Mild peribronchial thickening again identified. There is no evidence of focal airspace disease, pulmonary edema, suspicious pulmonary nodule/mass, pleural effusion, or pneumothorax. No acute bony abnormalities are identified. IMPRESSION: No active cardiopulmonary disease. Electronically Signed   By: Margarette Canada M.D.   On: 07/12/2022 13:23     Patient Active Problem List   Diagnosis Date Noted   Viral URI 07/12/2022   Pneumonia of right upper lobe due to infectious organism 06/06/2022   Right ear pain 05/10/2022   Headache 04/17/2022   Pain due to onychomycosis of toenails of both  feet 04/04/2022   Generalized weakness 11/28/2021   Encounter for medication review 11/28/2021   Shoulder pain 04/25/2021   History of breast cancer 06/18/2020   Anemia 06/16/2020   Elevated TSH 06/16/2020   Occipital neuralgia 02/14/2020   Hair loss 11/09/2018   Leg weakness, bilateral 06/29/2018   Pedal edema 06/22/2018   AICD (automatic cardioverter/defibrillator) present 06/11/2018   GERD (gastroesophageal reflux disease) 06/10/2018   AKI (acute kidney injury) ([place]) 06/10/2018   Cholecystitis 06/10/2018   Chronic back pain [id number]   Chronic systolic (congestive) heart failure ([place]) 02/25/2017   Osteopenia 06/16/2016   Routine general medical examination at a health care facility 04/24/2016   Estrogen deficiency 04/24/2016   Urticaria 11/03/2015   Cerumen impaction 03/10/2015   Encounter for Medicare annual wellness exam 06/13/2014   Stroke, small vessel ([place]) 01/18/2014   Family history of hemochromatosis 01/18/2014   Cerebral thrombosis with cerebral infarction ([place]) 01/14/2014   Expressive aphasia 01/13/2014   Nonischemic cardiomyopathy ([place]) 04/05/2013   Shortness of breath 09/03/2012   ETD (Eustachian tube dysfunction), right 06/16/2012   Elevated transaminase level 05/18/2012   Spinal stenosis of lumbar region 12/10/2011   Mixed incontinence urge and stress 12/10/2011   Renal insufficiency 11/04/2011   Goiter 01/14/2011  Fatty liver 08/28/2010   Type II diabetes mellitus with renal manifestations ([place]) 07/09/2010   Hyperlipidemia associated with type 2 diabetes mellitus ([place]) 07/09/2010   Hyperkalemia 07/09/2010   REFLEX SYMPATHETIC DYSTROPHY 07/09/2010   Neuropathy 07/09/2010   Essential hypertension 07/09/2010   CARDIOMYOPATHY 07/09/2010   OVERACTIVE BLADDER 07/09/2010   Past Medical History:  Diagnosis Date   Arthritis    "hands" (01/13/2014)   Basal cell carcinoma 01/2014   "bridge of nose"   Cardiac LV ejection fraction >40%    "it was 43 last year"  (01/13/2014)   Chronic lower back pain    CKD (chronic kidney disease), stage III ([place])    acute on chronic stage III/notes 06/10/2018   Colon polyps    Expressive aphasia    "3 times in the last week" (01/13/2014)   GERD (gastroesophageal reflux disease)    Goiter past remote   treated with RI   Hyperlipidemia    Hyperpotassemia    Hypertension    Hypertonicity of bladder    Inflammatory and toxic neuropathy, unspecified    LBBB (left bundle branch block)    Migraine    "stopped many years ago" (01/13/2014)   Mini stroke 01/2014   Other primary cardiomyopathies    Overactive bladder    Presence of combination internal cardiac defibrillator (ICD) and pacemaker    Reflex sympathetic dystrophy, unspecified    Shingles    Shortness of breath    ambulation   Stroke ([place])    Thyroid disease    Type II diabetes mellitus ([place])    Ulcer    Urine incontinence    Vertigo    hx of   Past Surgical History:  Procedure Laterality Date   BACK SURGERY     BI-VENTRICULAR PACEMAKER INSERTION (CRT-P)  10/2014   [facility]   BREAST CYST EXCISION Left 1959   CARDIAC CATHETERIZATION  "several"   [place]   CATARACT EXTRACTION W/ INTRAOCULAR LENS IMPLANT Left ~ 2005   several eye injections   CATARACT EXTRACTION W/PHACO Right 05/04/2020   Procedure: CATARACT EXTRACTION PHACO AND INTRAOCULAR LENS PLACEMENT ([place]) RIGHT DIABETIC;  Surgeon: Birder Robson, MD;  Location: [facility];  Service: Ophthalmology;  Laterality: Right;  [place] 00:58.5 CDE 6.70 Fluid Pack [place] # [id number]   CHOLECYSTECTOMY N/A 06/13/2018   Procedure: LAPAROSCOPIC CHOLECYSTECTOMY;  Surgeon: Donnie Mesa, MD;  Location: [facility];  Service: General;  Laterality: N/A;   COLONOSCOPY  7/12   normal (hx of polyps in past)    CT SCAN  3/12   outside hosp- lung nodule and gallstones   ERCP N/A 06/12/2018   Procedure: ENDOSCOPIC RETROGRADE CHOLANGIOPANCREATOGRAPHY (ERCP);  Surgeon: Carol Ada, MD;  Location: [facility];  Service:  Endoscopy;  Laterality: N/A;   [place]   "knot on index"   KIDNEY DONATION Left 1989   LUMBAR LAMINECTOMY/DECOMPRESSION MICRODISCECTOMY  1999   LUMBAR LAMINECTOMY/DECOMPRESSION MICRODISCECTOMY  01/31/2012   Procedure: LUMBAR LAMINECTOMY/DECOMPRESSION MICRODISCECTOMY 2 LEVELS;  Surgeon: Eustace Moore, MD;  Location: [facility];  Service: Neurosurgery;  Laterality: N/A;  Thoracic twelve-lumbar one, lumbar one-two laminectomy    POSTERIOR LAMINECTOMY / [place]   REMOVAL OF STONES  06/12/2018   Procedure: REMOVAL OF STONES;  Surgeon: Carol Ada, MD;  Location: [facility] ENDOSCOPY;  Service: Endoscopy;;   SPHINCTEROTOMY  06/12/2018   Procedure: Joan Mayans;  Surgeon: Carol Ada, MD;  Location: [facility] ENDOSCOPY;  Service: Endoscopy;;   [place]  UPPER GASTROINTESTINAL ENDOSCOPY     Social History   Tobacco Use   Smoking status: Never   Smokeless tobacco: Never  Vaping Use   Vaping Use: Never used  Substance Use Topics   Alcohol use: No    Alcohol/week: 0.0 standard drinks of alcohol   Drug use: No   Family History  Problem Relation Age of Onset   Arthritis Mother    Cancer Mother        uterine and mouth   Hyperlipidemia Mother    Stroke Mother    Hypertension Mother    Alcohol abuse Father    Diabetes Father    Cancer Sister        breast   Diabetes Sister    Cancer Brother        lung cancer   Kidney disease Brother    Diabetes Brother    Hyperlipidemia Sister    Heart disease Sister    Hypertension Sister    Diabetes Sister    Hyperlipidemia Brother    Kidney failure Brother    Diabetes Daughter    Addison's disease Other    Allergies  Allergen Reactions   Ace Inhibitors Swelling    REACTION: tongue swelling   Lisinopril Swelling    Swelling of tongue   Insulin Detemir Itching   Nsaids Other (See Comments)    Renal issues   Codeine Nausea Only   Hydrochlorothiazide Other (See Comments)     Dizziness/funny feeling   Tylenol [Acetaminophen] Other (See Comments)    Fatty liver - use with caution   Current Outpatient Medications on File Prior to Visit  Medication Sig Dispense Refill   amLODipine (NORVASC) 2.5 MG tablet Take 2.5 mg by mouth at bedtime.     aspirin EC 81 MG tablet Take 1 tablet (81 mg total) by mouth daily. Swallow whole. 90 tablet 3   atorvastatin (LIPITOR) 80 MG tablet Take 1 tablet (80 mg total) by mouth daily. 90 tablet 3   carvedilol (COREG) 12.5 MG tablet Take 1 tablet (12.5 mg total) by mouth in the morning, at noon, and at bedtime. 270 tablet 3   cetirizine (ZYRTEC) 10 MG tablet Take 10 mg by mouth daily.     clotrimazole-betamethasone (LOTRISONE) cream Apply 1 application topically as needed (groin).      dapagliflozin propanediol (FARXIGA) 5 MG TABS tablet Take 5 mg by mouth daily.     Dulaglutide 4.5 MG/0.5ML SOPN Inject 4.5 mg into the skin once a week.     famotidine (PEPCID) 20 MG tablet Take 1 tablet by mouth twice daily 60 tablet 5   gabapentin (NEURONTIN) 300 MG capsule Take 1 capsule (300 mg total) by mouth 3 (three) times daily. 270 capsule 3   glimepiride (AMARYL) 2 MG tablet Take 2 mg by mouth daily with breakfast.     hydrALAZINE (APRESOLINE) 50 MG tablet Take 1 tablet (50 mg total) by mouth 3 (three) times daily. 270 tablet 3   insulin degludec (TRESIBA) 100 UNIT/ML FlexTouch Pen Inject 26 Units into the skin at bedtime.     isosorbide mononitrate (IMDUR) 60 MG 24 hr tablet Take 1 tablet (60 mg total) by mouth in the morning and at bedtime. 180 tablet 3   letrozole (FEMARA) 2.5 MG tablet Take 2.5 mg by mouth daily.     nystatin cream (MYCOSTATIN) Apply 1 application topically 2 (two) times daily as needed (irritation.).      pantoprazole (PROTONIX) 40 MG tablet Take 1 tablet by  mouth once daily 90 tablet 1   traMADol (ULTRAM) 50 MG tablet TAKE 1 TO 2 TABLETS BY MOUTH EVERY 8 HOURS AS NEEDED FOR MODERATE TO SEVERE PAIN 60 tablet 0   [name]  ([name]) 75 MG TABS Take 1 tablet by mouth once daily 90 tablet 2   vitamin B-12 (CYANOCOBALAMIN) 1000 MCG tablet Take 1,000 mcg by mouth daily.      Vitamin D, Ergocalciferol, (DRISDOL) 1.25 MG (50000 UT) CAPS capsule Take 50,000 Units by mouth every 7 (seven) days.     No current facility-administered medications on file prior to visit.      Review of Systems  Constitutional:  Positive for appetite change and fatigue. Negative for fever.  HENT:  Positive for congestion, postnasal drip and rhinorrhea. Negative for ear pain, sinus pressure, sneezing and sore throat.   Eyes:  Negative for pain and discharge.  Respiratory:  Positive for cough. Negative for chest tightness, shortness of breath, wheezing and stridor.   Cardiovascular:  Negative for chest pain.  Gastrointestinal:  Negative for diarrhea, nausea and vomiting.  Genitourinary:  Negative for frequency, hematuria and urgency.  Musculoskeletal:  Negative for arthralgias and myalgias.  Skin:  Negative for rash.  Neurological:  Negative for dizziness, weakness, light-headedness and headaches.       Getting stronger now with PT   Psychiatric/Behavioral:  Negative for confusion and dysphoric mood.        Objective:   Physical Exam Constitutional:      General: She is not in acute distress.    Appearance: Normal appearance. She is normal weight. She is not ill-appearing or diaphoretic.  HENT:     Head: Normocephalic and atraumatic.     Right Ear: Tympanic membrane and ear canal normal.     Left Ear: Tympanic membrane and ear canal normal.     Nose: Congestion and rhinorrhea present.     Comments: Mild boggy nares    Mouth/Throat:     Mouth: Mucous membranes are moist.     Pharynx: Oropharynx is clear. No oropharyngeal exudate or posterior oropharyngeal erythema.  Eyes:     General:        Right eye: No discharge.        Left eye: No discharge.     Conjunctiva/sclera: Conjunctivae normal.     Pupils: Pupils are equal,  round, and reactive to light.  Cardiovascular:     Rate and Rhythm: Normal rate and regular rhythm.     Heart sounds: Normal heart sounds.  Pulmonary:     Effort: Pulmonary effort is normal. No respiratory distress.     Breath sounds: Normal breath sounds. No stridor. No wheezing, rhonchi or rales.     Comments: Breast sounds are mildly distant at the bases No wheeze even on forced expiration  Musculoskeletal:     Cervical back: Neck supple.  Lymphadenopathy:     Cervical: Cervical adenopathy present.  Skin:    Coloration: Skin is not pale.     Findings: No erythema.  Neurological:     Mental Status: She is alert.     Cranial Nerves: No cranial nerve deficit.     Comments: No focal weakness  Psychiatric:        Mood and Affect: Mood normal.           Assessment & Plan:   Problem List Items Addressed This Visit       Respiratory   Pneumonia of right upper lobe due to infectious organism -  Primary    Overall cough has improved (but this is complicated by viral head cold this week)  Pulse ox 93% and reassuring exam  PT for gen weakness, she is starting to improve   Cxr ordered  CXR clear-no opacity noted Some peribronchial thickening noted       Relevant Orders   DG Chest 2 View (Completed)   Viral URI    This week Runny /stuffy nose Little cough/mild  No fever/sob Reassuring exam Disc sympt care Disc ER precautions  Is getting cxr today for re check of pna      Relevant Orders   DG Chest 2 View (Completed)

## 2022-07-12 NOTE — Patient Instructions (Addendum)
Continue the pepcid twice daily   Try taking the generic protonix every other day  See how you do  If acid symptoms get really bad - start back on it daily   Continue to treat your cold symptoms  Drink lots of fluids and   Chest xray today

## 2022-07-12 NOTE — Assessment & Plan Note (Signed)
This week Runny /stuffy nose Little cough/mild  No fever/sob Reassuring exam Disc sympt care Disc ER precautions  Is getting cxr today for re check of pna

## 2022-07-15 DIAGNOSIS — M48061 Spinal stenosis, lumbar region without neurogenic claudication: Secondary | ICD-10-CM | POA: Diagnosis not present

## 2022-07-15 DIAGNOSIS — Z7982 Long term (current) use of aspirin: Secondary | ICD-10-CM | POA: Diagnosis not present

## 2022-07-15 DIAGNOSIS — I5022 Chronic systolic (congestive) heart failure: Secondary | ICD-10-CM | POA: Diagnosis not present

## 2022-07-15 DIAGNOSIS — E1129 Type 2 diabetes mellitus with other diabetic kidney complication: Secondary | ICD-10-CM | POA: Diagnosis not present

## 2022-07-15 DIAGNOSIS — I428 Other cardiomyopathies: Secondary | ICD-10-CM | POA: Diagnosis not present

## 2022-07-15 DIAGNOSIS — J188 Other pneumonia, unspecified organism: Secondary | ICD-10-CM | POA: Diagnosis not present

## 2022-07-15 DIAGNOSIS — E1169 Type 2 diabetes mellitus with other specified complication: Secondary | ICD-10-CM | POA: Diagnosis not present

## 2022-07-15 DIAGNOSIS — Z7985 Long-term (current) use of injectable non-insulin antidiabetic drugs: Secondary | ICD-10-CM | POA: Diagnosis not present

## 2022-07-15 DIAGNOSIS — D649 Anemia, unspecified: Secondary | ICD-10-CM | POA: Diagnosis not present

## 2022-07-15 DIAGNOSIS — E1122 Type 2 diabetes mellitus with diabetic chronic kidney disease: Secondary | ICD-10-CM | POA: Diagnosis not present

## 2022-07-15 DIAGNOSIS — I130 Hypertensive heart and chronic kidney disease with heart failure and stage 1 through stage 4 chronic kidney disease, or unspecified chronic kidney disease: Secondary | ICD-10-CM | POA: Diagnosis not present

## 2022-07-15 DIAGNOSIS — N179 Acute kidney failure, unspecified: Secondary | ICD-10-CM | POA: Diagnosis not present

## 2022-07-15 DIAGNOSIS — K760 Fatty (change of) liver, not elsewhere classified: Secondary | ICD-10-CM | POA: Diagnosis not present

## 2022-07-15 DIAGNOSIS — E785 Hyperlipidemia, unspecified: Secondary | ICD-10-CM | POA: Diagnosis not present

## 2022-07-15 DIAGNOSIS — Z7984 Long term (current) use of oral hypoglycemic drugs: Secondary | ICD-10-CM | POA: Diagnosis not present

## 2022-07-15 DIAGNOSIS — Z794 Long term (current) use of insulin: Secondary | ICD-10-CM | POA: Diagnosis not present

## 2022-07-15 DIAGNOSIS — E1140 Type 2 diabetes mellitus with diabetic neuropathy, unspecified: Secondary | ICD-10-CM | POA: Diagnosis not present

## 2022-07-15 DIAGNOSIS — I959 Hypotension, unspecified: Secondary | ICD-10-CM | POA: Diagnosis not present

## 2022-07-15 DIAGNOSIS — Z9181 History of falling: Secondary | ICD-10-CM | POA: Diagnosis not present

## 2022-07-15 DIAGNOSIS — N183 Chronic kidney disease, stage 3 unspecified: Secondary | ICD-10-CM | POA: Diagnosis not present

## 2022-07-18 ENCOUNTER — Other Ambulatory Visit: Payer: Self-pay | Admitting: Pharmacist

## 2022-07-18 NOTE — Progress Notes (Signed)
Care Coordination Call  Patient called me; confirmed that her income meets the income requirements for [employer] assistance for Ozempic (in place of Trulicity, as Trulicity backorder is still affecting the patient assistance program) and [place]. Will collaborate with CPhT team to mail patient portion to patient and fax Dr. Gabriel Carina for provider portion. Contacted [facility], left voicemail for Dr. Joycie Peek team.  Patient also notes she is due for a refill of Farxiga. Contacted [facility]; provided billing information for [place]: [place]: [id number]; PCN: [id number]; Group: [id number]; ID: [id number]. Per the pharmacy, she filled a 90 day supply in January. Called patient back - she was able to find two extra bottles of 30 days that she had put in a different drawer.   Patient verbalized understanding.  Catie Hedwig Morton, PharmD, [place], [facility] [phone]

## 2022-07-19 NOTE — Progress Notes (Signed)
Yes! I will get that mailed out to pt  Thanks! Sandre [facility] Patient Advocate

## 2022-07-23 DIAGNOSIS — I959 Hypotension, unspecified: Secondary | ICD-10-CM | POA: Diagnosis not present

## 2022-07-23 DIAGNOSIS — M48061 Spinal stenosis, lumbar region without neurogenic claudication: Secondary | ICD-10-CM | POA: Diagnosis not present

## 2022-07-23 DIAGNOSIS — Z9181 History of falling: Secondary | ICD-10-CM | POA: Diagnosis not present

## 2022-07-23 DIAGNOSIS — Z794 Long term (current) use of insulin: Secondary | ICD-10-CM | POA: Diagnosis not present

## 2022-07-23 DIAGNOSIS — I5022 Chronic systolic (congestive) heart failure: Secondary | ICD-10-CM | POA: Diagnosis not present

## 2022-07-23 DIAGNOSIS — E1169 Type 2 diabetes mellitus with other specified complication: Secondary | ICD-10-CM | POA: Diagnosis not present

## 2022-07-23 DIAGNOSIS — Z7984 Long term (current) use of oral hypoglycemic drugs: Secondary | ICD-10-CM | POA: Diagnosis not present

## 2022-07-23 DIAGNOSIS — E1129 Type 2 diabetes mellitus with other diabetic kidney complication: Secondary | ICD-10-CM | POA: Diagnosis not present

## 2022-07-23 DIAGNOSIS — I428 Other cardiomyopathies: Secondary | ICD-10-CM | POA: Diagnosis not present

## 2022-07-23 DIAGNOSIS — E785 Hyperlipidemia, unspecified: Secondary | ICD-10-CM | POA: Diagnosis not present

## 2022-07-23 DIAGNOSIS — N183 Chronic kidney disease, stage 3 unspecified: Secondary | ICD-10-CM | POA: Diagnosis not present

## 2022-07-23 DIAGNOSIS — K760 Fatty (change of) liver, not elsewhere classified: Secondary | ICD-10-CM | POA: Diagnosis not present

## 2022-07-23 DIAGNOSIS — E1122 Type 2 diabetes mellitus with diabetic chronic kidney disease: Secondary | ICD-10-CM | POA: Diagnosis not present

## 2022-07-23 DIAGNOSIS — Z7985 Long-term (current) use of injectable non-insulin antidiabetic drugs: Secondary | ICD-10-CM | POA: Diagnosis not present

## 2022-07-23 DIAGNOSIS — J188 Other pneumonia, unspecified organism: Secondary | ICD-10-CM | POA: Diagnosis not present

## 2022-07-23 DIAGNOSIS — Z7982 Long term (current) use of aspirin: Secondary | ICD-10-CM | POA: Diagnosis not present

## 2022-07-23 DIAGNOSIS — D649 Anemia, unspecified: Secondary | ICD-10-CM | POA: Diagnosis not present

## 2022-07-23 DIAGNOSIS — E1140 Type 2 diabetes mellitus with diabetic neuropathy, unspecified: Secondary | ICD-10-CM | POA: Diagnosis not present

## 2022-07-23 DIAGNOSIS — N179 Acute kidney failure, unspecified: Secondary | ICD-10-CM | POA: Diagnosis not present

## 2022-07-23 DIAGNOSIS — I130 Hypertensive heart and chronic kidney disease with heart failure and stage 1 through stage 4 chronic kidney disease, or unspecified chronic kidney disease: Secondary | ICD-10-CM | POA: Diagnosis not present

## 2022-07-29 DIAGNOSIS — Z9181 History of falling: Secondary | ICD-10-CM | POA: Diagnosis not present

## 2022-07-29 DIAGNOSIS — I959 Hypotension, unspecified: Secondary | ICD-10-CM | POA: Diagnosis not present

## 2022-07-29 DIAGNOSIS — I428 Other cardiomyopathies: Secondary | ICD-10-CM | POA: Diagnosis not present

## 2022-07-29 DIAGNOSIS — E1122 Type 2 diabetes mellitus with diabetic chronic kidney disease: Secondary | ICD-10-CM | POA: Diagnosis not present

## 2022-07-29 DIAGNOSIS — E1129 Type 2 diabetes mellitus with other diabetic kidney complication: Secondary | ICD-10-CM | POA: Diagnosis not present

## 2022-07-29 DIAGNOSIS — E785 Hyperlipidemia, unspecified: Secondary | ICD-10-CM | POA: Diagnosis not present

## 2022-07-29 DIAGNOSIS — Z794 Long term (current) use of insulin: Secondary | ICD-10-CM | POA: Diagnosis not present

## 2022-07-29 DIAGNOSIS — N183 Chronic kidney disease, stage 3 unspecified: Secondary | ICD-10-CM | POA: Diagnosis not present

## 2022-07-29 DIAGNOSIS — N179 Acute kidney failure, unspecified: Secondary | ICD-10-CM | POA: Diagnosis not present

## 2022-07-29 DIAGNOSIS — K760 Fatty (change of) liver, not elsewhere classified: Secondary | ICD-10-CM | POA: Diagnosis not present

## 2022-07-29 DIAGNOSIS — Z7984 Long term (current) use of oral hypoglycemic drugs: Secondary | ICD-10-CM | POA: Diagnosis not present

## 2022-07-29 DIAGNOSIS — M48061 Spinal stenosis, lumbar region without neurogenic claudication: Secondary | ICD-10-CM | POA: Diagnosis not present

## 2022-07-29 DIAGNOSIS — Z7985 Long-term (current) use of injectable non-insulin antidiabetic drugs: Secondary | ICD-10-CM | POA: Diagnosis not present

## 2022-07-29 DIAGNOSIS — D649 Anemia, unspecified: Secondary | ICD-10-CM | POA: Diagnosis not present

## 2022-07-29 DIAGNOSIS — I130 Hypertensive heart and chronic kidney disease with heart failure and stage 1 through stage 4 chronic kidney disease, or unspecified chronic kidney disease: Secondary | ICD-10-CM | POA: Diagnosis not present

## 2022-07-29 DIAGNOSIS — E1140 Type 2 diabetes mellitus with diabetic neuropathy, unspecified: Secondary | ICD-10-CM | POA: Diagnosis not present

## 2022-07-29 DIAGNOSIS — J188 Other pneumonia, unspecified organism: Secondary | ICD-10-CM | POA: Diagnosis not present

## 2022-07-29 DIAGNOSIS — Z7982 Long term (current) use of aspirin: Secondary | ICD-10-CM | POA: Diagnosis not present

## 2022-07-29 DIAGNOSIS — E1169 Type 2 diabetes mellitus with other specified complication: Secondary | ICD-10-CM | POA: Diagnosis not present

## 2022-07-29 DIAGNOSIS — I5022 Chronic systolic (congestive) heart failure: Secondary | ICD-10-CM | POA: Diagnosis not present

## 2022-07-30 ENCOUNTER — Encounter: Payer: Self-pay | Admitting: Family

## 2022-07-30 ENCOUNTER — Ambulatory Visit (INDEPENDENT_AMBULATORY_CARE_PROVIDER_SITE_OTHER)
Admission: RE | Admit: 2022-07-30 | Discharge: 2022-07-30 | Disposition: A | Discharge status: Home / Self Care | Payer: Medicare HMO | Source: Ambulatory Visit | Attending: Family Medicine | Admitting: Family Medicine

## 2022-07-30 ENCOUNTER — Other Ambulatory Visit: Payer: Self-pay | Admitting: Family

## 2022-07-30 ENCOUNTER — Ambulatory Visit (INDEPENDENT_AMBULATORY_CARE_PROVIDER_SITE_OTHER): Payer: Medicare HMO | Admitting: Family

## 2022-07-30 VITALS — BP 128/78 | HR 64 | Temp 97.7°F | Ht 61.0 in | Wt 147.8 lb

## 2022-07-30 DIAGNOSIS — J209 Acute bronchitis, unspecified: Secondary | ICD-10-CM

## 2022-07-30 DIAGNOSIS — R0989 Other specified symptoms and signs involving the circulatory and respiratory systems: Secondary | ICD-10-CM

## 2022-07-30 DIAGNOSIS — R059 Cough, unspecified: Secondary | ICD-10-CM | POA: Diagnosis not present

## 2022-07-30 DIAGNOSIS — R062 Wheezing: Secondary | ICD-10-CM

## 2022-07-30 DIAGNOSIS — J40 Bronchitis, not specified as acute or chronic: Secondary | ICD-10-CM | POA: Diagnosis not present

## 2022-07-30 DIAGNOSIS — J029 Acute pharyngitis, unspecified: Secondary | ICD-10-CM

## 2022-07-30 DIAGNOSIS — H6591 Unspecified nonsuppurative otitis media, right ear: Secondary | ICD-10-CM

## 2022-07-30 DIAGNOSIS — R0789 Other chest pain: Secondary | ICD-10-CM | POA: Diagnosis not present

## 2022-07-30 DIAGNOSIS — D72829 Elevated white blood cell count, unspecified: Secondary | ICD-10-CM

## 2022-07-30 DIAGNOSIS — J189 Pneumonia, unspecified organism: Secondary | ICD-10-CM | POA: Diagnosis not present

## 2022-07-30 DIAGNOSIS — R0602 Shortness of breath: Secondary | ICD-10-CM

## 2022-07-30 LAB — POCT RAPID STREP A (OFFICE): Rapid Strep A Screen: NEGATIVE

## 2022-07-30 LAB — POC COVID19 BINAXNOW: SARS Coronavirus 2 Ag: NEGATIVE

## 2022-07-30 MED ORDER — ALBUTEROL SULFATE (2.5 MG/3ML) 0.083% IN NEBU: 2.5000 mg | INHALATION_SOLUTION | Freq: Four times a day (QID) | RESPIRATORY_TRACT | 1 refills | Status: DC | PRN

## 2022-07-30 MED ORDER — NEBULIZER/TUBING/MOUTHPIECE KIT: 1.0000 | PACK | Freq: Three times a day (TID) | 0 refills | Status: AC | PRN

## 2022-07-30 MED ORDER — AMOXICILLIN-POT CLAVULANATE 875-125 MG PO TABS: 1.0000 | ORAL_TABLET | Freq: Two times a day (BID) | ORAL | 0 refills | Status: DC

## 2022-07-30 NOTE — Assessment & Plan Note (Addendum)
Strep test negative. CXR negative. Rx sent to patient's pharmacy for Augmentin and Albuterol.  Advised patient to take OTC Tylenol as needed for pain.  Patient advised to notify provider/seek urgent care if symptoms get worse or does not improve; she develops fever and/or chills.

## 2022-07-30 NOTE — Progress Notes (Signed)
It could potentially be pleurisy however with this it is usually treated with prednisone and or ibuprofen. Ibuprofen is not great for pt congestive heart failure, and I can not give prednisone because her diabetes is higher than expected, not at goal.I will send an RX for a nebulizer as well.   Can we ask pt to come in for lab only to get some labs, to rule out either congestive heart failure exacerbation and to make sure white blood cells are no longer elevated. Order already placed.

## 2022-07-30 NOTE — Progress Notes (Unsigned)
Established Patient Office Visit  Subjective:   Patient ID: Morgan Hubbard, female    DOB: 03/09/1935  Age: 88 y.o. MRN: [id number]  CC:  Chief Complaint  Patient presents with   Cough    HPI: Morgan Hubbard is a 88 y.o. female presenting today for cough.   Cough: Cough started last Thursday and got worse over the weekend. Cough is productive with cream colored sputum. Reports chest congestion, some shortness of breath, and throat "discomfort on the right side. Also reports ear pain, but states ear pain is an ongoing issue that she is seeing ENT for and has a follow-up in a couple weeks. She has ben taken Tussin for cough with some improvement.  Denies fever, chills, sinus pressure, and runny nose. She denies being around anyone who has been sick and did not test at home for COVID.  Patient treated for PNA and strep on 1/9 with Augmentin and Z-pack.  CXR on 2/9 showed was negative for cardiopulmonary disease,     ROS: Negative unless specifically indicated above in HPI.   Relevant past medical history reviewed and updated as indicated.   Allergies and medications reviewed and updated.   Current Outpatient Medications:    amLODipine (NORVASC) 2.5 MG tablet, Take 2.5 mg by mouth at bedtime., Disp: , Rfl:    aspirin EC 81 MG tablet, Take 1 tablet (81 mg total) by mouth daily. Swallow whole., Disp: 90 tablet, Rfl: 3   atorvastatin (LIPITOR) 80 MG tablet, Take 1 tablet (80 mg total) by mouth daily., Disp: 90 tablet, Rfl: 3   carvedilol (COREG) 12.5 MG tablet, Take 1 tablet (12.5 mg total) by mouth in the morning, at noon, and at bedtime., Disp: 270 tablet, Rfl: 3   cetirizine (ZYRTEC) 10 MG tablet, Take 10 mg by mouth daily., Disp: , Rfl:    clotrimazole-betamethasone (LOTRISONE) cream, Apply 1 application topically as needed (groin). , Disp: , Rfl:    dapagliflozin propanediol (FARXIGA) 5 MG TABS tablet, Take 5 mg by mouth daily., Disp: , Rfl:    Dulaglutide 4.5 MG/0.5ML SOPN, Inject 4.5  mg into the skin once a week., Disp: , Rfl:    famotidine (PEPCID) 20 MG tablet, Take 1 tablet by mouth twice daily, Disp: 60 tablet, Rfl: 5   gabapentin (NEURONTIN) 300 MG capsule, Take 1 capsule (300 mg total) by mouth 3 (three) times daily., Disp: 270 capsule, Rfl: 3   glimepiride (AMARYL) 2 MG tablet, Take 2 mg by mouth daily with breakfast., Disp: , Rfl:    hydrALAZINE (APRESOLINE) 50 MG tablet, Take 1 tablet (50 mg total) by mouth 3 (three) times daily., Disp: 270 tablet, Rfl: 3   insulin degludec (TRESIBA) 100 UNIT/ML FlexTouch Pen, Inject 26 Units into the skin at bedtime., Disp: , Rfl:    isosorbide mononitrate (IMDUR) 60 MG 24 hr tablet, Take 1 tablet (60 mg total) by mouth in the morning and at bedtime., Disp: 180 tablet, Rfl: 3   letrozole (FEMARA) 2.5 MG tablet, Take 2.5 mg by mouth daily., Disp: , Rfl:    nystatin cream (MYCOSTATIN), Apply 1 application topically 2 (two) times daily as needed (irritation.). , Disp: , Rfl:    pantoprazole (PROTONIX) 40 MG tablet, Take 1 tablet by mouth once daily, Disp: 90 tablet, Rfl: 1   traMADol (ULTRAM) 50 MG tablet, TAKE 1 TO 2 TABLETS BY MOUTH EVERY 8 HOURS AS NEEDED FOR MODERATE TO SEVERE PAIN, Disp: 60 tablet, Rfl: 0   [name] ([name]) 75  MG TABS, Take 1 tablet by mouth once daily, Disp: 90 tablet, Rfl: 2   vitamin B-12 (CYANOCOBALAMIN) 1000 MCG tablet, Take 1,000 mcg by mouth daily. , Disp: , Rfl:    Vitamin D, Ergocalciferol, (DRISDOL) 1.25 MG (50000 UT) CAPS capsule, Take 50,000 Units by mouth every 7 (seven) days., Disp: , Rfl:    albuterol (PROVENTIL) (2.5 MG/3ML) 0.083% nebulizer solution, Take 3 mLs (2.5 mg total) by nebulization every 6 (six) hours as needed for wheezing or shortness of breath., Disp: 150 mL, Rfl: 1   amoxicillin-clavulanate (AUGMENTIN) 875-125 MG tablet, Take 1 tablet by mouth 2 (two) times daily., Disp: 20 tablet, Rfl: 0  Allergies  Allergen Reactions   Ace Inhibitors Swelling    REACTION: tongue swelling    Lisinopril Swelling    Swelling of tongue   Insulin Detemir Itching   Nsaids Other (See Comments)    Renal issues   Codeine Nausea Only   Hydrochlorothiazide Other (See Comments)    Dizziness/funny feeling   Tylenol [Acetaminophen] Other (See Comments)    Fatty liver - use with caution    Objective:   BP 128/78   Pulse 64   Temp 97.7 F (36.5 C) (Temporal)   Ht '5\' 1"'$  (1.549 m)   Wt 147 lb 12.8 oz (67 kg)   LMP 06/03/1992   SpO2 97%   BMI 27.93 kg/m    Physical Exam Vitals and nursing note reviewed.  Constitutional:      General: She is not in acute distress.    Appearance: Normal appearance. She is normal weight. She is not ill-appearing.  HENT:     Right Ear: Ear canal and external ear normal. No middle ear effusion. Tympanic membrane is erythematous and retracted.     Left Ear: Ear canal and external ear normal.  No middle ear effusion. Tympanic membrane is not erythematous or retracted.     Nose: Nose normal.     Right Sinus: No maxillary sinus tenderness or frontal sinus tenderness.     Left Sinus: No maxillary sinus tenderness or frontal sinus tenderness.     Mouth/Throat:     Mouth: Mucous membranes are moist.     Pharynx: Oropharynx is clear. No pharyngeal swelling, oropharyngeal exudate or posterior oropharyngeal erythema.  Eyes:     Extraocular Movements: Extraocular movements intact.     Conjunctiva/sclera: Conjunctivae normal.     Pupils: Pupils are equal, round, and reactive to light.  Cardiovascular:     Rate and Rhythm: Normal rate and regular rhythm.     Pulses: Normal pulses.     Heart sounds: Normal heart sounds.  Pulmonary:     Effort: Pulmonary effort is normal.     Breath sounds: Decreased air movement present. Examination of the right-upper field reveals wheezing. Examination of the left-upper field reveals wheezing. Examination of the right-lower field reveals wheezing and rhonchi. Examination of the left-lower field reveals wheezing and rhonchi.  Wheezing (expiratory) and rhonchi present. No rales.  Neurological:     General: No focal deficit present.     Mental Status: She is alert and oriented to person, place, and time.  Psychiatric:        Mood and Affect: Mood normal.        Behavior: Behavior normal.        Thought Content: Thought content normal.        Judgment: Judgment normal.    Assessment & Plan:  Cough in adult Assessment & Plan: COVID test negative.  CXR negative. Rx sent to patient's pharmacy for Augmentin and Albuterol.  Patient advised to notify provider/seek urgent care if symptoms get worse or does not improve; she develops fever and/or chills.   Orders: -     POC COVID-19 BinaxNow  Expiratory wheezing Assessment & Plan: CXR negative. Rx sent to patient's pharmacy for Augmentin and Albuterol.  Patient advised to notify provider/seek urgent care if symptoms get worse or does not improve; she develops fever and/or chills.   Orders: -     DG Chest 2 View; Future  Rhonchi at right lung base Assessment & Plan: CXR negative. Rx sent to patient's pharmacy for Augmentin and Albuterol. Patient advised to notify provider/seek urgent care if symptoms get worse or does not improve; she develops fever and/or chills.   Orders: -     DG Chest 2 View; Future  Sore throat Assessment & Plan: Strep test negative. CXR negative. Rx sent to patient's pharmacy for Augmentin and Albuterol.  Advised patient to take OTC Tylenol as needed for pain.  Patient advised to notify provider/seek urgent care if symptoms get worse or does not improve; she develops fever and/or chills.   Orders: -     POCT rapid strep A     Follow up plan: Return in 1 week (on 08/06/2022) for  with pcp for wheezing .  Johny Shock, RN AGNP-student

## 2022-07-30 NOTE — Assessment & Plan Note (Addendum)
CXR negative. Rx sent to patient's pharmacy for Augmentin and Albuterol.  Patient advised to notify provider/seek urgent care if symptoms get worse or does not improve; she develops fever and/or chills.

## 2022-07-30 NOTE — Assessment & Plan Note (Addendum)
COVID test negative. CXR negative. Rx sent to patient's pharmacy for Augmentin and Albuterol.  Patient advised to notify provider/seek urgent care if symptoms get worse or does not improve; she develops fever and/or chills.

## 2022-07-30 NOTE — Progress Notes (Signed)
No sign of pneumonia or bronchitis, will send in augmentin for treatment of ear infection.  For wheezing I did send in nebulizer solution, does pt have nebulizer? Use for wheezing prn.  Make sure pt has one week f/u with Dr. [name], her PCP due to presentation today with wheezing to make sure improvement.

## 2022-07-30 NOTE — Progress Notes (Signed)
Pt seen in office today FYI with expiratory wheezing all quadrants, worse in RLL however negative CXR. Sent in Augmentin bc also notable right AOM did request she has close f/u with you bc she also had the wheezing which I did send in nebulizer treatment solution. Hesitant to give steroids due to last A1c being 9.4. just wanted her to see you soon after because of her recent episode of pneumonia early jan. Might need to consider CT and or pulmonary consult?

## 2022-07-30 NOTE — Progress Notes (Unsigned)
Established Patient Office Visit  Subjective:   Patient ID: Morgan Hubbard, female    DOB: Oct 24, 1934  Age: 88 y.o. MRN: [id number]  CC:  Chief Complaint  Patient presents with   Cough    HPI: Morgan Hubbard is a 88 y.o. female presenting on 07/30/2022 for Cough   Cough    Six day h/o chest congestion, productive cough, as well as right sided ear pain (managed by ENT, has f/u in two weeks), some sore throat as well. Did take some tussin for cough with mild improvement. No fever or chills. No sinus pressure.   Did not covid test.    CXXR 07/12/22 with mild peribronchial thickening  ROS: Negative unless specifically indicated above in HPI.   Relevant past medical history reviewed and updated as indicated.   Allergies and medications reviewed and updated.   Current Outpatient Medications:    amLODipine (NORVASC) 2.5 MG tablet, Take 2.5 mg by mouth at bedtime., Disp: , Rfl:    aspirin EC 81 MG tablet, Take 1 tablet (81 mg total) by mouth daily. Swallow whole., Disp: 90 tablet, Rfl: 3   atorvastatin (LIPITOR) 80 MG tablet, Take 1 tablet (80 mg total) by mouth daily., Disp: 90 tablet, Rfl: 3   carvedilol (COREG) 12.5 MG tablet, Take 1 tablet (12.5 mg total) by mouth in the morning, at noon, and at bedtime., Disp: 270 tablet, Rfl: 3   cetirizine (ZYRTEC) 10 MG tablet, Take 10 mg by mouth daily., Disp: , Rfl:    clotrimazole-betamethasone (LOTRISONE) cream, Apply 1 application topically as needed (groin). , Disp: , Rfl:    dapagliflozin propanediol (FARXIGA) 5 MG TABS tablet, Take 5 mg by mouth daily., Disp: , Rfl:    Dulaglutide 4.5 MG/0.5ML SOPN, Inject 4.5 mg into the skin once a week., Disp: , Rfl:    famotidine (PEPCID) 20 MG tablet, Take 1 tablet by mouth twice daily, Disp: 60 tablet, Rfl: 5   gabapentin (NEURONTIN) 300 MG capsule, Take 1 capsule (300 mg total) by mouth 3 (three) times daily., Disp: 270 capsule, Rfl: 3   glimepiride (AMARYL) 2 MG tablet, Take 2 mg by mouth daily  with breakfast., Disp: , Rfl:    hydrALAZINE (APRESOLINE) 50 MG tablet, Take 1 tablet (50 mg total) by mouth 3 (three) times daily., Disp: 270 tablet, Rfl: 3   insulin degludec (TRESIBA) 100 UNIT/ML FlexTouch Pen, Inject 26 Units into the skin at bedtime., Disp: , Rfl:    isosorbide mononitrate (IMDUR) 60 MG 24 hr tablet, Take 1 tablet (60 mg total) by mouth in the morning and at bedtime., Disp: 180 tablet, Rfl: 3   letrozole (FEMARA) 2.5 MG tablet, Take 2.5 mg by mouth daily., Disp: , Rfl:    nystatin cream (MYCOSTATIN), Apply 1 application topically 2 (two) times daily as needed (irritation.). , Disp: , Rfl:    pantoprazole (PROTONIX) 40 MG tablet, Take 1 tablet by mouth once daily, Disp: 90 tablet, Rfl: 1   traMADol (ULTRAM) 50 MG tablet, TAKE 1 TO 2 TABLETS BY MOUTH EVERY 8 HOURS AS NEEDED FOR MODERATE TO SEVERE PAIN, Disp: 60 tablet, Rfl: 0   [name] ([name]) 75 MG TABS, Take 1 tablet by mouth once daily, Disp: 90 tablet, Rfl: 2   vitamin B-12 (CYANOCOBALAMIN) 1000 MCG tablet, Take 1,000 mcg by mouth daily. , Disp: , Rfl:    Vitamin D, Ergocalciferol, (DRISDOL) 1.25 MG (50000 UT) CAPS capsule, Take 50,000 Units by mouth every 7 (seven) days., Disp: , Rfl:  Allergies  Allergen Reactions   Ace Inhibitors Swelling    REACTION: tongue swelling   Lisinopril Swelling    Swelling of tongue   Insulin Detemir Itching   Nsaids Other (See Comments)    Renal issues   Codeine Nausea Only   Hydrochlorothiazide Other (See Comments)    Dizziness/funny feeling   Tylenol [Acetaminophen] Other (See Comments)    Fatty liver - use with caution    Objective:   BP 128/78   Pulse 64   Temp 97.7 F (36.5 C) (Temporal)   Ht '5\' 1"'$  (1.549 m)   Wt 147 lb 12.8 oz (67 kg)   LMP 06/03/1992   SpO2 97%   BMI 27.93 kg/m    Physical Exam Constitutional:      General: She is not in acute distress.    Appearance: Normal appearance. She is normal weight. She is not ill-appearing, toxic-appearing or  diaphoretic.  HENT:     Head: Normocephalic.     Right Ear: Hearing, ear canal and external ear normal. No middle ear effusion. Tympanic membrane is erythematous and retracted.     Left Ear: Hearing, ear canal and external ear normal.  No middle ear effusion. There is no impacted cerumen. Tympanic membrane is not erythematous.  Cardiovascular:     Rate and Rhythm: Normal rate and regular rhythm.  Pulmonary:     Effort: Pulmonary effort is normal.     Breath sounds: Decreased air movement present. Examination of the right-upper field reveals wheezing. Examination of the left-upper field reveals wheezing. Examination of the right-middle field reveals wheezing. Examination of the left-middle field reveals wheezing. Examination of the right-lower field reveals wheezing and rhonchi. Examination of the left-lower field reveals wheezing. Wheezing and rhonchi present. No rales.  Musculoskeletal:        General: Normal range of motion.  Neurological:     General: No focal deficit present.     Mental Status: She is alert and oriented to person, place, and time. Mental status is at baseline.  Psychiatric:        Mood and Affect: Mood normal.        Behavior: Behavior normal.        Thought Content: Thought content normal.        Judgment: Judgment normal.     Assessment & Plan:  Cough in adult -     POC COVID-19 BinaxNow  Expiratory wheezing -     DG Chest 2 View; Future  Rhonchi at right lung base -     DG Chest 2 View; Future  Sore throat -     POCT rapid strep A     Follow up plan: No follow-ups on file.  Eugenia Pancoast, FNP

## 2022-07-30 NOTE — Assessment & Plan Note (Addendum)
CXR negative. Rx sent to patient's pharmacy for Augmentin and Albuterol. Patient advised to notify provider/seek urgent care if symptoms get worse or does not improve; she develops fever and/or chills.

## 2022-08-01 ENCOUNTER — Other Ambulatory Visit: Payer: Self-pay | Admitting: Family

## 2022-08-01 DIAGNOSIS — R062 Wheezing: Secondary | ICD-10-CM

## 2022-08-01 DIAGNOSIS — J209 Acute bronchitis, unspecified: Secondary | ICD-10-CM

## 2022-08-01 DIAGNOSIS — J40 Bronchitis, not specified as acute or chronic: Secondary | ICD-10-CM | POA: Insufficient documentation

## 2022-08-01 MED ORDER — PREDNISONE 20 MG PO TABS: 20.0000 mg | ORAL_TABLET | Freq: Every day | ORAL | 0 refills | Status: AC

## 2022-08-01 MED ORDER — ALBUTEROL SULFATE HFA 108 (90 BASE) MCG/ACT IN AERS: 2.0000 | INHALATION_SPRAY | Freq: Four times a day (QID) | RESPIRATORY_TRACT | 0 refills | Status: DC | PRN

## 2022-08-01 NOTE — Assessment & Plan Note (Addendum)
Covid and strep test negative  Stat CXR negative for pneumonia and or acute process Treating with low dose prednisone 5 mg once daily for five days due to risk for hyperglycemia.  Advised pt to check glucose daily and report any significant elevations  Monitor for worsening s/s of sob, worsening wheezing, chest pain.  Rx sent for nebulizer and albuterol solution as well as albuterol inhaler prn sob  Advised patient on supportive measures:  Be sure to rest, drink plenty of fluids, and use tylenol  as needed for pain. Follow up if fever >101, if symptoms worsen or if symptoms are not improved in 3 days. Patient verbalizes understanding.   Recommendation for close f/u with PCP

## 2022-08-01 NOTE — Progress Notes (Signed)
Any recommendations for what she can do for chest pressure/tightness? With her bronchitis, she had a good amount of wheezing on exam. Hesitant to give her prednisone due to [facility], could I give her some ibuprofen(wanting to check with you due to cardiac history)? In case there is some pleurisy? Can she take her tramadol do you feel this would help?

## 2022-08-01 NOTE — Progress Notes (Signed)
Thank you for your recommendation!

## 2022-08-01 NOTE — Progress Notes (Signed)
albuter

## 2022-08-05 ENCOUNTER — Ambulatory Visit: Payer: Medicare HMO | Admitting: Family Medicine

## 2022-08-05 ENCOUNTER — Ambulatory Visit (INDEPENDENT_AMBULATORY_CARE_PROVIDER_SITE_OTHER): Payer: Medicare HMO | Admitting: Family Medicine

## 2022-08-05 ENCOUNTER — Encounter: Payer: Self-pay | Admitting: Family Medicine

## 2022-08-05 VITALS — BP 124/62 | HR 77 | Temp 98.3°F | Ht 61.0 in | Wt 145.0 lb

## 2022-08-05 DIAGNOSIS — N183 Chronic kidney disease, stage 3 unspecified: Secondary | ICD-10-CM

## 2022-08-05 DIAGNOSIS — J40 Bronchitis, not specified as acute or chronic: Secondary | ICD-10-CM

## 2022-08-05 DIAGNOSIS — I5022 Chronic systolic (congestive) heart failure: Secondary | ICD-10-CM | POA: Diagnosis not present

## 2022-08-05 DIAGNOSIS — I428 Other cardiomyopathies: Secondary | ICD-10-CM

## 2022-08-05 DIAGNOSIS — E1129 Type 2 diabetes mellitus with other diabetic kidney complication: Secondary | ICD-10-CM

## 2022-08-05 DIAGNOSIS — N289 Disorder of kidney and ureter, unspecified: Secondary | ICD-10-CM

## 2022-08-05 NOTE — Assessment & Plan Note (Signed)
No clinical changes Under care of cardiology

## 2022-08-05 NOTE — Assessment & Plan Note (Signed)
GFR now 27.85 Avoiding nephrotoxins  Trying hard to keep up fluids  Continue nephrology f/u

## 2022-08-05 NOTE — Progress Notes (Signed)
Subjective:    Patient ID: Morgan Hubbard, female    DOB: 1935/05/01, 88 y.o.   MRN: [id number]  HPI Pt presents for f/u of cxr and wheezing  Wt Readings from Last 3 Encounters:  08/05/22 145 lb (65.8 kg)  07/30/22 147 lb 12.8 oz (67 kg)  07/12/22 145 lb 8 oz (66 kg)   27.40 kg/m  Vitals:   08/05/22 1359  BP: 124/62  Pulse: 77  Temp: 98.3 F (36.8 C)  SpO2: 95%     She was seen by NP Dubal on 2/27 for cough  bronchitis Noted more exp wheezing and some rhonchi at R lung base   Cxr was neg Strep test was neg   Treated with prednisone 20 mg daily for 5 d (watching glucose)  Nmt solution sent  Augmentin also    DG Chest 2 View  Result Date: 07/30/2022 CLINICAL DATA:  Pneumonia 2 weeks ago. Recurrent cough, wheezing, chest tightness. EXAM: CHEST - 2 VIEW COMPARISON:  Chest radiograph 07/12/2022 FINDINGS: The left chest wall cardiac device is stable. The heart size is stable, with unchanged cardiomegaly. The upper mediastinal contours are normal. There is no focal consolidation or pulmonary edema. There is no pleural effusion or pneumothorax. Overall, aeration is unchanged in the interval. There is no acute osseous abnormality. Upper abdominal surgical clips are noted. IMPRESSION: Stable chest with no radiographic evidence of acute cardiopulmonary process. Electronically Signed   By: Valetta Mole M.D.   On: 07/30/2022 11:16   DG Chest 2 View  Result Date: 07/12/2022 CLINICAL DATA:  History of RIGHT UPPER lobe pneumonia. EXAM: CHEST - 2 VIEW COMPARISON:  No recent studies available for comparison. 11/29/2021 radiographs FINDINGS: Cardiomediastinal silhouette is unchanged. LEFT pacemaker/ICD again identified. Mild peribronchial thickening again identified. There is no evidence of focal airspace disease, pulmonary edema, suspicious pulmonary nodule/mass, pleural effusion, or pneumothorax. No acute bony abnormalities are identified. IMPRESSION: No active cardiopulmonary disease.  Electronically Signed   By: Margarette Canada M.D.   On: 07/12/2022 13:23     Now finally much better!  Able to do some chores this weekend Cough is improved Chest does not hurt  Not wheezing  Mdi = did not need the nebulizer   Patient Active Problem List   Diagnosis Date Noted   Bronchitis with acute wheezing 08/01/2022   Pain due to onychomycosis of toenails of both feet 04/04/2022   History of breast cancer 06/18/2020   Anemia 06/16/2020   Elevated TSH 06/16/2020   Occipital neuralgia 02/14/2020   Hair loss 11/09/2018   Leg weakness, bilateral 06/29/2018   Pedal edema 06/22/2018   AICD (automatic cardioverter/defibrillator) present 06/11/2018   GERD (gastroesophageal reflux disease) 06/10/2018   AKI (acute kidney injury) ([place]) 06/10/2018   Cholecystitis 06/10/2018   Chronic back pain [id number]   Chronic systolic (congestive) heart failure ([place]) 02/25/2017   Osteopenia 06/16/2016   Estrogen deficiency 04/24/2016   Urticaria 11/03/2015   Cough in adult 05/31/2014   Stroke, small vessel ([place]) 01/18/2014   Family history of hemochromatosis 01/18/2014   Cerebral thrombosis with cerebral infarction ([place]) 01/14/2014   Expressive aphasia 01/13/2014   Nonischemic cardiomyopathy ([place]) 04/05/2013   Shortness of breath 09/03/2012   ETD (Eustachian tube dysfunction), right 06/16/2012   Elevated transaminase level 05/18/2012   Spinal stenosis of lumbar region 12/10/2011   Mixed incontinence urge and stress 12/10/2011   Renal insufficiency 11/04/2011   Goiter 01/14/2011   Fatty liver 08/28/2010   Type II diabetes mellitus with  renal manifestations ([place]) 07/09/2010   Hyperlipidemia associated with type 2 diabetes mellitus ([place]) 07/09/2010   Hyperkalemia 07/09/2010   REFLEX SYMPATHETIC DYSTROPHY 07/09/2010   Neuropathy 07/09/2010   Essential hypertension 07/09/2010   CARDIOMYOPATHY 07/09/2010   OVERACTIVE BLADDER 07/09/2010   Past Medical History:  Diagnosis Date   Arthritis     "hands" (01/13/2014)   Basal cell carcinoma 01/2014   "bridge of nose"   Cardiac LV ejection fraction >40%    "it was 43 last year" (01/13/2014)   Chronic lower back pain    CKD (chronic kidney disease), stage III ([place])    acute on chronic stage III/notes 06/10/2018   Colon polyps    Expressive aphasia    "3 times in the last week" (01/13/2014)   GERD (gastroesophageal reflux disease)    Goiter past remote   treated with RI   Hyperlipidemia    Hyperpotassemia    Hypertension    Hypertonicity of bladder    Inflammatory and toxic neuropathy, unspecified    LBBB (left bundle branch block)    Migraine    "stopped many years ago" (01/13/2014)   Mini stroke 01/2014   Other primary cardiomyopathies    Overactive bladder    Presence of combination internal cardiac defibrillator (ICD) and pacemaker    Reflex sympathetic dystrophy, unspecified    Shingles    Shortness of breath    ambulation   Stroke ([place])    Thyroid disease    Type II diabetes mellitus ([place])    Ulcer    Urine incontinence    Vertigo    hx of   Past Surgical History:  Procedure Laterality Date   BACK SURGERY     BI-VENTRICULAR PACEMAKER INSERTION (CRT-P)  10/2014   [facility]   BREAST CYST EXCISION Left 1959   CARDIAC CATHETERIZATION  "several"   [place]   CATARACT EXTRACTION W/ INTRAOCULAR LENS IMPLANT Left ~ 2005   several eye injections   CATARACT EXTRACTION W/PHACO Right 05/04/2020   Procedure: CATARACT EXTRACTION PHACO AND INTRAOCULAR LENS PLACEMENT ([place]) RIGHT DIABETIC;  Surgeon: Birder Robson, MD;  Location: [facility];  Service: Ophthalmology;  Laterality: Right;  [place] 00:58.5 CDE 6.70 Fluid Pack [place] # [id number]   CHOLECYSTECTOMY N/A 06/13/2018   Procedure: LAPAROSCOPIC CHOLECYSTECTOMY;  Surgeon: Donnie Mesa, MD;  Location: [facility];  Service: General;  Laterality: N/A;   COLONOSCOPY  7/12   normal (hx of polyps in past)    CT SCAN  3/12   outside hosp- lung nodule and gallstones   ERCP N/A 06/12/2018    Procedure: ENDOSCOPIC RETROGRADE CHOLANGIOPANCREATOGRAPHY (ERCP);  Surgeon: Carol Ada, MD;  Location: [facility];  Service: Endoscopy;  Laterality: N/A;   [place]   "knot on index"   KIDNEY DONATION Left 1989   LUMBAR LAMINECTOMY/DECOMPRESSION MICRODISCECTOMY  1999   LUMBAR LAMINECTOMY/DECOMPRESSION MICRODISCECTOMY  01/31/2012   Procedure: LUMBAR LAMINECTOMY/DECOMPRESSION MICRODISCECTOMY 2 LEVELS;  Surgeon: Eustace Moore, MD;  Location: [facility];  Service: Neurosurgery;  Laterality: N/A;  Thoracic twelve-lumbar one, lumbar one-two laminectomy    POSTERIOR LAMINECTOMY / [place]   REMOVAL OF STONES  06/12/2018   Procedure: REMOVAL OF STONES;  Surgeon: Carol Ada, MD;  Location: [facility] ENDOSCOPY;  Service: Endoscopy;;   SPHINCTEROTOMY  06/12/2018   Procedure: Joan Mayans;  Surgeon: Carol Ada, MD;  Location: [facility];  Service: Endoscopy;;   [facility]OSCOPY     Social History  Tobacco Use   Smoking status: Never   Smokeless tobacco: Never  Vaping Use   Vaping Use: Never used  Substance Use Topics   Alcohol use: No    Alcohol/week: 0.0 standard drinks of alcohol   Drug use: No   Family History  Problem Relation Age of Onset   Arthritis Mother    Cancer Mother        uterine and mouth   Hyperlipidemia Mother    Stroke Mother    Hypertension Mother    Alcohol abuse Father    Diabetes Father    Cancer Sister        breast   Diabetes Sister    Cancer Brother        lung cancer   Kidney disease Brother    Diabetes Brother    Hyperlipidemia Sister    Heart disease Sister    Hypertension Sister    Diabetes Sister    Hyperlipidemia Brother    Kidney failure Brother    Diabetes Daughter    Addison's disease Other    Allergies  Allergen Reactions   Ace Inhibitors Swelling    REACTION: tongue swelling   Lisinopril Swelling    Swelling of tongue   Insulin Detemir  Itching   Nsaids Other (See Comments)    Renal issues   Codeine Nausea Only   Hydrochlorothiazide Other (See Comments)    Dizziness/funny feeling   Tylenol [Acetaminophen] Other (See Comments)    Fatty liver - use with caution   Current Outpatient Medications on File Prior to Visit  Medication Sig Dispense Refill   albuterol (VENTOLIN HFA) 108 (90 Base) MCG/ACT inhaler Inhale 2 puffs into the lungs every 6 (six) hours as needed for wheezing or shortness of breath. 8 g 0   amLODipine (NORVASC) 2.5 MG tablet Take 2.5 mg by mouth at bedtime.     amoxicillin-clavulanate (AUGMENTIN) 875-125 MG tablet Take 1 tablet by mouth 2 (two) times daily. 20 tablet 0   aspirin EC 81 MG tablet Take 1 tablet (81 mg total) by mouth daily. Swallow whole. 90 tablet 3   atorvastatin (LIPITOR) 80 MG tablet Take 1 tablet (80 mg total) by mouth daily. 90 tablet 3   carvedilol (COREG) 12.5 MG tablet Take 1 tablet (12.5 mg total) by mouth in the morning, at noon, and at bedtime. 270 tablet 3   cetirizine (ZYRTEC) 10 MG tablet Take 10 mg by mouth daily.     clotrimazole-betamethasone (LOTRISONE) cream Apply 1 application topically as needed (groin).      dapagliflozin propanediol (FARXIGA) 5 MG TABS tablet Take 5 mg by mouth daily.     Dulaglutide 4.5 MG/0.5ML SOPN Inject 4.5 mg into the skin once a week.     famotidine (PEPCID) 20 MG tablet Take 1 tablet by mouth twice daily 60 tablet 5   gabapentin (NEURONTIN) 300 MG capsule Take 1 capsule (300 mg total) by mouth 3 (three) times daily. 270 capsule 3   glimepiride (AMARYL) 2 MG tablet Take 2 mg by mouth daily with breakfast.     hydrALAZINE (APRESOLINE) 50 MG tablet Take 1 tablet (50 mg total) by mouth 3 (three) times daily. 270 tablet 3   insulin degludec (TRESIBA) 100 UNIT/ML FlexTouch Pen Inject 26 Units into the skin at bedtime.     isosorbide mononitrate (IMDUR) 60 MG 24 hr tablet Take 1 tablet (60 mg total) by mouth in the morning and at bedtime. 180 tablet 3    letrozole (FEMARA) 2.5  MG tablet Take 2.5 mg by mouth daily.     nystatin cream (MYCOSTATIN) Apply 1 application topically 2 (two) times daily as needed (irritation.).      pantoprazole (PROTONIX) 40 MG tablet Take 1 tablet by mouth once daily 90 tablet 1   predniSONE (DELTASONE) 20 MG tablet Take 1 tablet (20 mg total) by mouth daily with breakfast for 5 days. 5 tablet 0   Respiratory Therapy Supplies (NEBULIZER/TUBING/MOUTHPIECE) KIT 1 each by Does not apply route 3 (three) times daily as needed. 1 kit 0   traMADol (ULTRAM) 50 MG tablet TAKE 1 TO 2 TABLETS BY MOUTH EVERY 8 HOURS AS NEEDED FOR MODERATE TO SEVERE PAIN 60 tablet 0   [name] ([name]) 75 MG TABS Take 1 tablet by mouth once daily 90 tablet 2   vitamin B-12 (CYANOCOBALAMIN) 1000 MCG tablet Take 1,000 mcg by mouth daily.      Vitamin D, Ergocalciferol, (DRISDOL) 1.25 MG (50000 UT) CAPS capsule Take 50,000 Units by mouth every 7 (seven) days.     No current facility-administered medications on file prior to visit.    Review of Systems  Constitutional:  Negative for activity change, appetite change, fatigue, fever and unexpected weight change.  HENT:  Negative for congestion, ear pain, rhinorrhea, sinus pressure and sore throat.   Eyes:  Negative for pain, redness and visual disturbance.  Respiratory:  Positive for cough. Negative for chest tightness, shortness of breath, wheezing and stridor.   Cardiovascular:  Negative for chest pain and palpitations.  Gastrointestinal:  Negative for abdominal pain, blood in stool, constipation and diarrhea.  Endocrine: Negative for polydipsia and polyuria.  Genitourinary:  Negative for dysuria, frequency and urgency.  Musculoskeletal:  Negative for arthralgias, back pain and myalgias.  Skin:  Negative for pallor and rash.  Allergic/Immunologic: Negative for environmental allergies.  Neurological:  Negative for dizziness, syncope and headaches.  Hematological:  Negative for adenopathy. Does not  bruise/bleed easily.  Psychiatric/Behavioral:  Negative for decreased concentration and dysphoric mood. The patient is not nervous/anxious.        Objective:   Physical Exam Constitutional:      General: She is not in acute distress.    Appearance: Normal appearance. She is well-developed and normal weight. She is not ill-appearing or diaphoretic.  HENT:     Head: Normocephalic and atraumatic.     Right Ear: Tympanic membrane and ear canal normal.     Left Ear: Tympanic membrane and ear canal normal.     Mouth/Throat:     Mouth: Mucous membranes are moist.  Eyes:     Conjunctiva/sclera: Conjunctivae normal.     Pupils: Pupils are equal, round, and reactive to light.  Neck:     Thyroid: No thyromegaly.     Vascular: No carotid bruit or JVD.  Cardiovascular:     Rate and Rhythm: Normal rate and regular rhythm.     Heart sounds: Normal heart sounds.     No gallop.  Pulmonary:     Effort: Pulmonary effort is normal. No respiratory distress.     Breath sounds: Normal breath sounds. No stridor. No wheezing, rhonchi or rales.     Comments: Good air exch No rales or rhonchi   No wheeze even on forced exp Chest:     Chest wall: No tenderness.  Abdominal:     General: There is no distension or abdominal bruit.     Palpations: Abdomen is soft.  Musculoskeletal:     Cervical back: Normal range  of motion and neck supple.     Right lower leg: No edema.     Left lower leg: No edema.  Lymphadenopathy:     Cervical: No cervical adenopathy.  Skin:    General: Skin is warm and dry.     Coloration: Skin is not pale.     Findings: No rash.  Neurological:     Mental Status: She is alert.     Coordination: Coordination normal.     Deep Tendon Reflexes: Reflexes are normal and symmetric. Reflexes normal.  Psychiatric:        Mood and Affect: Mood normal.           Assessment & Plan:   Problem List Items Addressed This Visit       Cardiovascular and Mediastinum   Chronic  systolic (congestive) heart failure (HCC)    No clinical changes Under care of cardiology      Nonischemic cardiomyopathy ([place])    No clinical changes [facility] care of cardiology        Respiratory   Bronchitis with acute wheezing - Primary    Much improved with 20 mg prednisone and augmentin abd alb mdi (does not have neb)  Reassuring exam  Last visit with NP Dugal reviewed along with past 2 cxr reports   Inst to call if symptoms worsen again            Endocrine   Type II diabetes mellitus with renal manifestations ([place])    Under endocrine care Glucose did go up with 20 mg prednisone- has one dose left         Genitourinary   Renal insufficiency    GFR now 27.85 Avoiding nephrotoxins  Trying hard to keep up fluids  Continue nephrology f/u        Stage 3 chronic kidney disease, unspecified whether stage 3a or 3b CKD ([place])    Under nephrology care  GFR last here 27.8

## 2022-08-05 NOTE — Assessment & Plan Note (Signed)
Under nephrology care  GFR last here 27.8

## 2022-08-05 NOTE — Assessment & Plan Note (Signed)
No clinical changes [facility] care of cardiology

## 2022-08-05 NOTE — Assessment & Plan Note (Signed)
Under endocrine care Glucose did go up with 20 mg prednisone- has one dose left

## 2022-08-05 NOTE — Assessment & Plan Note (Addendum)
Much improved with 20 mg prednisone and augmentin abd alb mdi (does not have neb)  Reassuring exam  Last visit with NP Dugal reviewed along with past 2 cxr reports   Inst to call if symptoms worsen again

## 2022-08-05 NOTE — Patient Instructions (Signed)
So glad you are doing better [facility] your medicines as planned   Let us know if you get worse again

## 2022-08-06 DIAGNOSIS — D649 Anemia, unspecified: Secondary | ICD-10-CM | POA: Diagnosis not present

## 2022-08-06 DIAGNOSIS — Z7984 Long term (current) use of oral hypoglycemic drugs: Secondary | ICD-10-CM | POA: Diagnosis not present

## 2022-08-06 DIAGNOSIS — E1140 Type 2 diabetes mellitus with diabetic neuropathy, unspecified: Secondary | ICD-10-CM | POA: Diagnosis not present

## 2022-08-06 DIAGNOSIS — N183 Chronic kidney disease, stage 3 unspecified: Secondary | ICD-10-CM | POA: Diagnosis not present

## 2022-08-06 DIAGNOSIS — E1169 Type 2 diabetes mellitus with other specified complication: Secondary | ICD-10-CM | POA: Diagnosis not present

## 2022-08-06 DIAGNOSIS — Z7985 Long-term (current) use of injectable non-insulin antidiabetic drugs: Secondary | ICD-10-CM | POA: Diagnosis not present

## 2022-08-06 DIAGNOSIS — E1129 Type 2 diabetes mellitus with other diabetic kidney complication: Secondary | ICD-10-CM | POA: Diagnosis not present

## 2022-08-06 DIAGNOSIS — J188 Other pneumonia, unspecified organism: Secondary | ICD-10-CM | POA: Diagnosis not present

## 2022-08-06 DIAGNOSIS — N179 Acute kidney failure, unspecified: Secondary | ICD-10-CM | POA: Diagnosis not present

## 2022-08-06 DIAGNOSIS — I428 Other cardiomyopathies: Secondary | ICD-10-CM | POA: Diagnosis not present

## 2022-08-06 DIAGNOSIS — I5022 Chronic systolic (congestive) heart failure: Secondary | ICD-10-CM | POA: Diagnosis not present

## 2022-08-06 DIAGNOSIS — Z794 Long term (current) use of insulin: Secondary | ICD-10-CM | POA: Diagnosis not present

## 2022-08-06 DIAGNOSIS — M48061 Spinal stenosis, lumbar region without neurogenic claudication: Secondary | ICD-10-CM | POA: Diagnosis not present

## 2022-08-06 DIAGNOSIS — Z9181 History of falling: Secondary | ICD-10-CM | POA: Diagnosis not present

## 2022-08-06 DIAGNOSIS — I959 Hypotension, unspecified: Secondary | ICD-10-CM | POA: Diagnosis not present

## 2022-08-06 DIAGNOSIS — I130 Hypertensive heart and chronic kidney disease with heart failure and stage 1 through stage 4 chronic kidney disease, or unspecified chronic kidney disease: Secondary | ICD-10-CM | POA: Diagnosis not present

## 2022-08-06 DIAGNOSIS — Z7982 Long term (current) use of aspirin: Secondary | ICD-10-CM | POA: Diagnosis not present

## 2022-08-06 DIAGNOSIS — K760 Fatty (change of) liver, not elsewhere classified: Secondary | ICD-10-CM | POA: Diagnosis not present

## 2022-08-06 DIAGNOSIS — E785 Hyperlipidemia, unspecified: Secondary | ICD-10-CM | POA: Diagnosis not present

## 2022-08-06 DIAGNOSIS — E1122 Type 2 diabetes mellitus with diabetic chronic kidney disease: Secondary | ICD-10-CM | POA: Diagnosis not present

## 2022-08-07 ENCOUNTER — Telehealth: Payer: Self-pay

## 2022-08-07 NOTE — Progress Notes (Signed)
Care Management & Coordination Services Pharmacy Team  Reason for Encounter: Appointment Reminder  Contacted patient to confirm telephone appointment with [name] , PharmD on 08/12/22 at 2:00. Spoke with patient on 08/07/2022   Do you have any problems getting your medications? No   What is your top health concern you would like to discuss at your upcoming visit? Continues to have bad cough   Have you seen any other providers since your last visit with PCP? No   Hospital visits:  None in previous 6 months   Star Rating Drugs:  Medication:  Last Fill: Day Supply Atorvastatin '80mg'$  07/04/22  90 Farxiga '5mg'$   [id number] Trulicity   0000000 84 Glimepiride '2mg'$  06/12/22 90 Tresiba  07/10/22  58      Care Gaps: Annual wellness visit in last year? Yes  If Diabetic: Last eye exam / retinopathy screening: UTD Last diabetic foot exam:UTD   [name], PharmD notified  [name], [place] Assistant [phone]

## 2022-08-12 ENCOUNTER — Ambulatory Visit: Payer: Medicare HMO | Admitting: Pharmacist

## 2022-08-12 NOTE — Progress Notes (Unsigned)
Care Management & Coordination Services Pharmacy Note  08/12/2022 Name:  Morgan [facility] MRN:  [id number] DOB:  14-Oct-1934  Summary: -Pt mailed [employer] application back  Recommendations/Changes made from today's visit: ***  Follow up plan: ***   Subjective: Morgan [facility] is an 88 y.o. year old female who is a primary patient of Tower, [name], MD.  The care coordination team was consulted for assistance with disease management and care coordination needs.    Engaged with patient by telephone for follow up visit.  Recent office visits: 08/05/22 Dr [name] OV: bronchitis - rx albuterol HFA.  07/30/22 NP Lawerance Bach Dugal OV: cough, wheezing - strep negative, CXR negative. rx augmentin and albuterol.  07/12/22 Dr [name] OV: f/u PNA. Try PPI QOD.   Recent consult visits: 07/08/22 Dr Gabriel Carina (Endocrine): DM - CGM report 34% TIR. Increase Trulicity to 4.5 mg weekly. Do not restart metformin.  Hospital visits: None in previous 6 months   Objective:  Lab Results  Component Value Date   CREATININE 1.67 (H) 01/01/2022   BUN 30 (H) 01/01/2022   GFR 27.85 (L) 01/01/2022   GFRNONAA 28 (L) 11/30/2021   GFRAA 41 (L) 06/15/2018   NA 140 01/01/2022   K 5.1 01/01/2022   CALCIUM 8.8 01/01/2022   CO2 31 01/01/2022   GLUCOSE 170 (H) 01/01/2022    Lab Results  Component Value Date/Time   HGBA1C 8.0 (H) 11/29/2021 04:16 PM   HGBA1C endo 01/03/2019 12:00 AM   HGBA1C 7.7 (H) 05/02/2017 09:40 AM   GFR 27.85 (L) 01/01/2022 10:29 AM   GFR 34.72 (L) 12/17/2021 12:44 PM   MICROALBUR 78.0 (H) 04/22/2016 02:55 PM    Last diabetic Eye exam:  Lab Results  Component Value Date/Time   HMDIABEYEEXA No Retinopathy 03/31/2020 12:00 AM    Last diabetic Foot exam: No results found for: "HMDIABFOOTEX"   Lab Results  Component Value Date   CHOL 99 06/12/2020   HDL 33.20 (L) 06/12/2020   LDLCALC 46 06/10/2019   LDLDIRECT 46.0 06/12/2020   TRIG 209.0 (H) 06/12/2020   CHOLHDL 3 06/12/2020       Latest  Ref Rng & Units 11/29/2021   12:53 PM 11/28/2021    3:37 PM 11/05/2021   11:17 AM  Hepatic Function  Total Protein 6.5 - 8.1 g/dL 5.3  5.4  5.5   Albumin 3.5 - 5.0 g/dL 2.8  3.4  3.3   AST 15 - 41 U/L '21  19  15   '$ ALT 0 - 44 U/L '16  13  8   '$ Alk Phosphatase 38 - 126 U/L 69  66  55   Total Bilirubin 0.3 - 1.2 mg/dL 1.0  0.4  0.6   Bilirubin, Direct 0.0 - 0.2 mg/dL 0.1   0.1     Lab Results  Component Value Date/Time   TSH 2.34 11/28/2021 03:37 PM   TSH 2.62 06/16/2020 11:18 AM   FREET4 1.46 06/16/2020 11:18 AM       Latest Ref Rng & Units 12/07/2021    1:37 PM 11/30/2021    2:22 AM 11/29/2021   12:53 PM  CBC  WBC 3.8 - 10.8 Thousand/uL 6.4  6.2  7.4   Hemoglobin 11.7 - 15.5 g/dL 11.0  10.4  10.6   Hematocrit 35.0 - 45.0 % 34.4  31.6  32.5   Platelets 140 - 400 Thousand/uL 179  177  185     No results found for: "VD25OH", "VITAMINB12"  Clinical ASCVD: Yes  The ASCVD Risk score (Arnett DK, et al., 2019) failed to calculate for the following reasons:   The 2019 ASCVD risk score is only valid for ages 78 to 53   The patient has a prior MI or stroke diagnosis       05/31/2022    3:04 PM 05/10/2022    4:07 PM 11/28/2021    3:03 PM  Depression screen PHQ 2/9  Decreased Interest 0 0 0  Down, Depressed, Hopeless 0 0 0  PHQ - 2 Score 0 0 0     Social History   Tobacco Use  Smoking Status Never  Smokeless Tobacco Never   BP Readings from Last 3 Encounters:  08/05/22 124/62  07/30/22 128/78  07/12/22 136/62   Pulse Readings from Last 3 Encounters:  08/05/22 77  07/30/22 64  07/12/22 84   Wt Readings from Last 3 Encounters:  08/05/22 145 lb (65.8 kg)  07/30/22 147 lb 12.8 oz (67 kg)  07/12/22 145 lb 8 oz (66 kg)   BMI Readings from Last 3 Encounters:  08/05/22 27.40 kg/m  07/30/22 27.93 kg/m  07/12/22 27.49 kg/m    Allergies  Allergen Reactions   Ace Inhibitors Swelling    REACTION: tongue swelling   Lisinopril Swelling    Swelling of tongue   Insulin  Detemir Itching   Nsaids Other (See Comments)    Renal issues   Codeine Nausea Only   Hydrochlorothiazide Other (See Comments)    Dizziness/funny feeling   Tylenol [Acetaminophen] Other (See Comments)    Fatty liver - use with caution    Medications Reviewed Today     Reviewed by Francella Solian, CMA (Certified Medical Assistant) on 08/05/22 at 1403  Med List Status: <None>   Medication Order Taking? Sig Documenting Provider Last Dose Status Informant  albuterol (PROVENTIL) (2.5 MG/3ML) 0.083% nebulizer solution [id number] No Take 3 mLs (2.5 mg total) by nebulization every 6 (six) hours as needed for wheezing or shortness of breath.  Patient not taking: Reported on 08/05/2022   Eugenia Pancoast, FNP Not Taking Active   albuterol (VENTOLIN HFA) 108 (90 Base) MCG/ACT inhaler [id number] Yes Inhale 2 puffs into the lungs every 6 (six) hours as needed for wheezing or shortness of breath. Eugenia Pancoast, FNP Taking Active   amLODipine (NORVASC) 2.5 MG tablet [id number] Yes Take 2.5 mg by mouth at bedtime. Justin Mend, MD Taking Active   amoxicillin-clavulanate (AUGMENTIN) 875-125 MG tablet [id number] Yes Take 1 tablet by mouth 2 (two) times daily. Eugenia Pancoast, FNP Taking Active   aspirin EC 81 MG tablet [id number] Yes Take 1 tablet (81 mg total) by mouth daily. Swallow whole. Minna Merritts, MD Taking Active Multiple Informants  atorvastatin (LIPITOR) 80 MG tablet [id number] Yes Take 1 tablet (80 mg total) by mouth daily. Minna Merritts, MD Taking Active   carvedilol (COREG) 12.5 MG tablet [id number] Yes Take 1 tablet (12.5 mg total) by mouth in the morning, at noon, and at bedtime. Minna Merritts, MD Taking Active   cetirizine (ZYRTEC) 10 MG tablet [id number] Yes Take 10 mg by mouth daily. [provider] Taking Active Multiple Informants  clotrimazole-betamethasone (LOTRISONE) cream [id number] Yes Apply 1 application topically as needed (groin).  [provider] Taking  Active Multiple Informants           Med Note Ebony Hail, AMBER B   Wed Jun 10, 2018  5:40 PM)    dapagliflozin propanediol (FARXIGA) 5 MG TABS tablet [id number] Yes Take 5  mg by mouth daily. [provider] Taking Active   Dulaglutide 4.5 MG/0.5ML SOPN [id number] Yes Inject 4.5 mg into the skin once a week. [provider] Taking Active   famotidine (PEPCID) 20 MG tablet [id number] Yes Take 1 tablet by mouth twice daily Tower, [name], MD Taking Active   gabapentin (NEURONTIN) 300 MG capsule [id number] Yes Take 1 capsule (300 mg total) by mouth 3 (three) times daily. Tower, [name], MD Taking Active   glimepiride (AMARYL) 2 MG tablet [id number] Yes Take 2 mg by mouth daily with breakfast. [provider] Taking Active   hydrALAZINE (APRESOLINE) 50 MG tablet [id number] Yes Take 1 tablet (50 mg total) by mouth 3 (three) times daily. Minna Merritts, MD Taking Active   insulin degludec (TRESIBA) 100 UNIT/ML FlexTouch Pen [id number] Yes Inject 26 Units into the skin at bedtime. [provider] Taking Active   isosorbide mononitrate (IMDUR) 60 MG 24 hr tablet [id number] Yes Take 1 tablet (60 mg total) by mouth in the morning and at bedtime. Minna Merritts, MD Taking Active   letrozole [facility]) 2.5 MG tablet [id number] Yes Take 2.5 mg by mouth daily. [provider] Taking Active Multiple Informants  nystatin cream (MYCOSTATIN) [id number] Yes Apply 1 application topically 2 (two) times daily as needed (irritation.).  [provider] Taking Active Multiple Informants  pantoprazole (PROTONIX) 40 MG tablet [id number] Yes Take 1 tablet by mouth once daily Tower, [name], MD Taking Active Multiple Informants  predniSONE (DELTASONE) 20 MG tablet [id number] Yes Take 1 tablet (20 mg total) by mouth daily with breakfast for 5 days. Eugenia Pancoast, FNP Taking Active   Respiratory Therapy Supplies (NEBULIZER/TUBING/MOUTHPIECE) KIT [id number] Yes 1 each by Does not apply  route 3 (three) times daily as needed. Eugenia Pancoast, FNP Taking Active   traMADol (ULTRAM) 50 MG tablet [id number] Yes TAKE 1 TO 2 TABLETS BY MOUTH EVERY 8 HOURS AS NEEDED FOR MODERATE TO SEVERE PAIN Tower, [name], MD Taking Active            Med Note Jodi Mourning, CATHERINE T   Thu Jul 11, 2022 10:06 AM) 1-2 times daily  [name] ([name]) 75 MG TABS [id number] Yes Take 1 tablet by mouth once daily Tower, [name], MD Taking Active   vitamin B-12 (CYANOCOBALAMIN) 1000 MCG tablet [id number] Yes Take 1,000 mcg by mouth daily.  [provider] Taking Active Multiple Informants  Vitamin D, Ergocalciferol, (DRISDOL) 1.25 MG (50000 UT) CAPS capsule [id number] Yes Take 50,000 Units by mouth every 7 (seven) days. [provider] Taking Active Multiple Informants           Med Note Tonia Ghent   Fri Dec 07, 2021 12:51 PM)              SDOH:  (Social Determinants of Health) assessments and interventions performed: {yes/no:20286} SDOH Interventions    Flowsheet Row Clinical Support from 05/31/2022 in [place] at [place] Management from 12/22/2020 in [place] at [place] from 06/10/2019 in [place] at [place] Interventions Intervention Not Indicated -- --  Housing Interventions Intervention Not Indicated -- --  Transportation Interventions Intervention Not Indicated -- --  Utilities Interventions Intervention Not Indicated -- --  Depression Interventions/Treatment  -- -- [id number] Score <4 Follow-up Not Indicated  Financial Strain Interventions Intervention Not Indicated Intervention Not Indicated --  Stress Interventions Intervention  Not Indicated -- --  Social Connections Interventions Intervention Not Indicated -- --       Medication Assistance:    Medication Access: Within the past 30 days, how often has patient missed a dose of  medication? *** Is a pillbox or other method used to improve adherence? {YES/NO:21197} Factors that may affect medication adherence? {CHL DESC; BARRIERS:21522} Are meds synced by current pharmacy? {YES/NO:21197} Are meds delivered by current pharmacy? {YES/NO:21197} Does patient experience delays in picking up medications due to transportation concerns? {YES/NO:21197}  [facility] Reviewed: Is patient disadvantaged to use [facility]?: {YES/NO:21197} Current Rx insurance plan: *** Name and location of Current pharmacy:  [facility] [address], [place] - [place] [address] Phone: [phone] Fax: [phone]  [facility] services reviewed with patient today?: {YES/NO:21197} Patient requests to transfer care to [facility]?: {YES/NO:21197} Reason patient declined to change pharmacies: {US patient preference:27474}  Compliance/Adherence/Medication fill history: Care Gaps: UACR  - done 11/02/21  Star-Rating Drugs: Atorvastatin - PDC 96% Farxiga - PDC 100% Glimepiride - PDC [id number] Trulicity - PDC [id number]    ASSESSMENT / PLAN  Hypertension / Heart Failure (BP goal <140/90) -Controlled- BP generally < 140/90 at home, improved with switch from [name] to [place]; she recently had mild hypotension in office visit at [facility] but improved with 16 oz of water -Current home readings: 124/64 -Denies hypotensive symptoms, but does report headache when BP is elevated. -Last ejection fraction: 45-50% (Date: 11/30/21) - improved from 15-20% (2015) -HF type: HFimpEF; NYHA Class: II-III -Current treatment: Carvedilol 12.5 mg - 2 AM, 1 PM - Appropriate, Query Effective Hydralazine 50 mg TID -Appropriate, Query Effective Isosorbide MN 60 mg BID -Appropriate, Query Effective Amlodipine 2.5 mg daily HS -Appropriate, Query Effective Farxiga 5 mg daily (HW) -Appropriate, Query Effective -Medications previously tried: furosemide, HCTZ,  losartan, lisinopril (tongue swelling) -Educated on BP goals and benefits of medications for prevention of heart attack, stroke and kidney damage; -Counseled to monitor BP at home daily for 1-2 weeks -Reviewed finances - pt enrolled in [employer] for Farxiga (Cardiomyopathy) -Recommended to continue current medication  Hyperlipidemia: (LDL goal < 70) -Controlled - LDL 46 (06/2020) at goal, due for repeat lipid panel -Hx stroke, NICM -Current treatment: Atorvastatin 80 mg daily - Appropriate, Effective, Safe, Accessible Aspirin 81 mg daily - Appropriate, Effective, Safe, Accessible -Medications previously tried: pravastatin  -Educated on Cholesterol goals;  -Recommended to continue current medication; repeat lipid panel at next OV  Diabetes (A1c goal <8%) -Uncontrolled, improving- A1c 9.4% (02/2022); per Freestyle Libre sugar control is improved overall, but recent acute illness (ear infection) has led to deterioration, she is on course of prednisone as well -Current home glucose readings: (Libre 3): reports unavailable -Current medications: Farxiga 5 mg daily (HW)- Appropriate, Query Effective (for DM) Glimepiride 1 mg daily AM -Appropriate, Effective, Query Safe Tresiba 16 units AM -Appropriate, Effective, Query Safe Trulicity 4.5 mg weekly - Appropriate, Effective, Safe, Accessible Freestyle Libre 3 w/ reader - Appropriate, Effective, Safe, Accessible -Medications previously tried: Geneticist, molecular (cost), Tradjenta (started Trulicity), metformin (GFR < 30) -Reviewed [name] is likely not very effective for DM control with GFR <30, though it should still remain on board for help with HF and CKD -Discussed diet at length - advised to try to keep snacks to < 15 g of carbs, maximize protein/vegetables regularly and avoid large portions of carbs -Discussed sugar control during acute illness/prednisone use; discussed restarting glimepiride while she is taking prednisone, she has  endocrine appt  next week, advised to follow up as scheduled  Chronic Kidney Disease Stage 4  -All medications assessed for renal dosing and appropriateness in chronic kidney disease. -Pt has 1 kidney - she donated 1 to her brother -Pt is on [place]. Pt is not on ACE/ARB due to hx of tongue swelling with lisinopril. -Recommended to continue current medication  GERD (Goal: minimize symptoms of reflux) -Controlled - per pt report -Current treatment  Pantoprazole 40 mg daily - Appropriate, Effective, Safe, Accessible Famotidine 20 mg BID -Appropriate, Effective, Safe, Accessible -Medications previously tried: n/a -Hx of Bleeds/ulcers: No -Counseled on small meals, elevating head, and sleeping on left side -Recommended to continue current medication  OAB (Goal: minimize nocturia) -Not ideally controlled - pt has switched from [name] to [place] due to high BP issues; she reports OAB control is about the same, still urinates frequently -[name] tier exception reduced price from $100 to $35 -Current treatment  [name] 70 mg daily - Appropriate, Effective, Safe, Accessible -Medications previously tried: Toviaz, oxybutynin, tolterodine  -Reviewed benefits of PT -Recommend to continue current medication  Neuropathy (Goal: manage symptoms) -Controlled - pt has been out of gabapentin for a while and neuropathy was worse, but she has refill now -Current treatment  Gabapentin 300 mg TID - Appropriate, Effective, Safe, Accessible -Medications previously tried: n/a  -Recommended to continue current medication  Breast cancer (Goal: prevent recurrence) -Controlled  -Follows with oncology (Dr Rip Harbour, [facility]). Per oncology, plan for 7-year course of Letrozole, will be completed 2024 -Current treatment  Letrozole 2.5 mg daily - Appropriate, Effective, Safe, Accessible -Medications previously tried: n/a  -Recommended to continue current medication    [name], PharmD, BCACP Clinical  Pharmacist [facility] [phone]

## 2022-08-13 ENCOUNTER — Telehealth: Payer: Self-pay

## 2022-08-13 NOTE — Telephone Encounter (Signed)
I have received pt portion  for PAP( NOVO NORDISK) [place] and  I have faxed provider portion to Dr. Gabriel Carina at [facility].  Sandre [facility] Patient Advocate [phone]

## 2022-08-14 ENCOUNTER — Telehealth: Payer: Self-pay | Admitting: Pharmacist

## 2022-08-14 NOTE — Patient Instructions (Signed)
Visit Information  Phone number for Pharmacist: [phone]  Thank you for meeting with me to discuss your medications! Below is a summary of what we talked about during the visit:   Recommendations/Changes made from today's visit: -No med changes; discussed plan to change to Ozempic (PAP pending)  Follow up plan: -Pharmacist follow up televisit scheduled for 1 month   [name], PharmD, [facility] Clinical Pharmacist [facility] [phone]

## 2022-08-14 NOTE — Telephone Encounter (Signed)
Received message from patient - she is wearing Freestyle Libre and knows to avoid "Airborne" products, but she has a cold that is dragging on and wants to know what would happen if she takes Airborne.  High doses of Vitamin C (>500 mg) are not recommended while wearing Freestyle Libre because they can interact with the sensor's ability to detect glucose under the skin. If she takes Vitamin C while wearing it, it may falsely elevate the sensor's glucose readings (the glucose value would read higher than it really is). This can lead to missing a low glucose event.   I would not recommend taking any product that contains greater than 500 mg of Vitamin C while she is wearing the [place]. I don't think Vitamin C would be very helpful to help with her cold anyway. I recommend to focus on fluid intake and OTC remedies - can use Tylenol, allergy medications like claritin and flonase.  Discussed with patient, she is voiced understanding.

## 2022-08-15 NOTE — Telephone Encounter (Signed)
Left message with spouse for pt to call me back.   I receive pt portion but still income verification from pt and provider potion before submitting to company.   Sandre Kitty Rx Patient Advocate [phone]

## 2022-08-22 DIAGNOSIS — Z95810 Presence of automatic (implantable) cardiac defibrillator: Secondary | ICD-10-CM | POA: Diagnosis not present

## 2022-08-22 DIAGNOSIS — R42 Dizziness and giddiness: Secondary | ICD-10-CM | POA: Diagnosis not present

## 2022-08-22 DIAGNOSIS — H6121 Impacted cerumen, right ear: Secondary | ICD-10-CM | POA: Diagnosis not present

## 2022-08-22 DIAGNOSIS — H903 Sensorineural hearing loss, bilateral: Secondary | ICD-10-CM | POA: Diagnosis not present

## 2022-08-26 DIAGNOSIS — Z95810 Presence of automatic (implantable) cardiac defibrillator: Secondary | ICD-10-CM | POA: Diagnosis not present

## 2022-08-28 ENCOUNTER — Other Ambulatory Visit: Payer: Self-pay | Admitting: Family Medicine

## 2022-08-28 NOTE — Telephone Encounter (Signed)
Name of Medication: Tramadol Name of Pharmacy: [facility] Last Fill or Written Date and Quantity: 05/02/22 #60 tabs/ 0 refill Last Office Visit and Type: 08/05/22 f/u on xray Next Office Visit and Type: none schedule

## 2022-08-29 ENCOUNTER — Other Ambulatory Visit: Payer: Medicare HMO | Admitting: Pharmacist

## 2022-08-29 ENCOUNTER — Telehealth: Payer: Self-pay | Admitting: Pharmacist

## 2022-08-29 NOTE — Telephone Encounter (Signed)
I received both pt and provider pages for [facility]. I will try and process with out pt income since I have both pt and provider pgs sign.    Sandre Kitty Rx Patient Advocate [phone]

## 2022-08-29 NOTE — Telephone Encounter (Signed)
Thank you! It may be easier to pass on what you have to me - Elmyra Ricks can fax to the [facility] office with ATTN to me: [phone]  I see Elmyra Ricks faxed the application to [employer] earlier today. Hopefully they will process without proof of income, the new version of the application doesn't require income documents as long as patient checks the box for FCRA.

## 2022-08-29 NOTE — Progress Notes (Signed)
Attempted to contact patient for scheduled appointment for medication management. She is now being followed by [name]. Left voicemail for patient.   Coordinated with CPhT. She has received patient portion of application and is waiting for income verification, she left a message with the patient on 3/14 that we were awaiting this. CPhT has not received provider portion back, though this appears to have been faxed back on 3/21. She will follow up on that today.   Will notify Mendel Ryder prior to 4/9 appointment.   Catie Hedwig Morton, PharmD, [place], [facility] [phone]

## 2022-09-05 ENCOUNTER — Telehealth: Payer: Self-pay

## 2022-09-05 NOTE — Progress Notes (Signed)
Care Management & Coordination Services Pharmacy Team  Reason for Encounter: Appointment Reminder  Contacted patient to confirm telephone appointment with [name] , PharmD on 09/10/22 at 3:45. Unsuccessful outreach. Left voicemail for patient to return call.   Have you seen any other providers since your last visit with PCP? No   Hospital visits:  None in previous 6 months   Star Rating Drugs:  Medication:  Last Fill: Day Supply Atorvastatin 80mg  07/04/22  90 Farxiga 5mg   06/19/22 90 Glimepiride 2mg  06/12/22 90 Tresiba  09/01/22 58  Care Gaps: Annual wellness visit in last year? Yes  If Diabetic: Last eye exam / retinopathy screening:UTD Last diabetic foot exam:UTD   [name], PharmD notified  M[place], [place] Assistant [phone]

## 2022-09-09 DIAGNOSIS — E785 Hyperlipidemia, unspecified: Secondary | ICD-10-CM | POA: Diagnosis not present

## 2022-09-09 DIAGNOSIS — N184 Chronic kidney disease, stage 4 (severe): Secondary | ICD-10-CM | POA: Diagnosis not present

## 2022-09-09 DIAGNOSIS — E1169 Type 2 diabetes mellitus with other specified complication: Secondary | ICD-10-CM | POA: Diagnosis not present

## 2022-09-09 DIAGNOSIS — Z794 Long term (current) use of insulin: Secondary | ICD-10-CM | POA: Diagnosis not present

## 2022-09-09 DIAGNOSIS — R809 Proteinuria, unspecified: Secondary | ICD-10-CM | POA: Diagnosis not present

## 2022-09-09 DIAGNOSIS — I10 Essential (primary) hypertension: Secondary | ICD-10-CM | POA: Diagnosis not present

## 2022-09-09 DIAGNOSIS — E1122 Type 2 diabetes mellitus with diabetic chronic kidney disease: Secondary | ICD-10-CM | POA: Diagnosis not present

## 2022-09-09 DIAGNOSIS — E1129 Type 2 diabetes mellitus with other diabetic kidney complication: Secondary | ICD-10-CM | POA: Diagnosis not present

## 2022-09-09 DIAGNOSIS — E1165 Type 2 diabetes mellitus with hyperglycemia: Secondary | ICD-10-CM | POA: Diagnosis not present

## 2022-09-10 ENCOUNTER — Ambulatory Visit: Payer: Medicare HMO | Admitting: Pharmacist

## 2022-09-10 NOTE — Progress Notes (Signed)
Care Management & Coordination Services Pharmacy Note  09/10/2022 Name:  Morgan Hubbard MRN:  [id number] DOB:  1934/09/22  Summary: F/u visit -DM: A1c 9.2% (09/2022); pt wearing Freestyle Libre, TIR was 49% (improved from 36% last month) and endocrine increased Trulicity to 4.5 mg, however pt has not started higher dose yet because she wants to finish 3 mg pens on hand first; also, planning to switch Trulicity to Ozempic to get it through PAP (application in process through endocrine) -HF / HTN: BP at goal in recent OV  Recommendations/Changes made from today's visit: -No med changes; discussed plan to change to Ozempic (PAP pending - advised pt to bring income documents to office so we can fax to [employer])  Follow up plan: -Pharmacist follow up televisit scheduled for 1 month    Subjective: Morgan Hubbard is an 88 y.o. year old female who is a primary patient of Tower, Audrie Gallus, MD.  The care coordination team was consulted for assistance with disease management and care coordination needs.    Engaged with patient by telephone for follow up visit.  Recent office visits: 08/05/22 Dr Milinda Antis OV: bronchitis - rx albuterol HFA.  07/30/22 NP Wyatt Mage Dugal OV: cough, wheezing - strep negative, CXR negative. rx augmentin and albuterol.  07/12/22 Dr Milinda Antis OV: f/u PNA. Try PPI QOD.   06/21/22 Dr Milinda Antis OV: f/u PNA - clinically improved. referral for [facility] for strength 06/14/22 Dr Milinda Antis OV: f/u PNA - clinically improved. Referral to ENT for OM/ETD. 06/06/22 NP Dugal OV: PNA - Rx augmentin, z-pak.  Recent consult visits: 09/09/22 Dr Tedd Sias (Endocrine): f/u - A1c 9.2% (4/24)  07/18/22 Endocrine TE - ok to switch Trulicity to Ozempic (PAP)  07/08/22 Dr Tedd Sias (Endocrine): DM - CGM report 34% TIR. Increase Trulicity to 4.5 mg weekly. Do not restart metformin.  07/03/22 Dr Elesa Massed (Cardiology): f/u NICM - euvolemic. No changes  Hospital visits: None in previous 6 months   Objective:  Lab Results  Component Value  Date   CREATININE 1.67 (H) 01/01/2022   BUN 30 (H) 01/01/2022   GFR 27.85 (L) 01/01/2022   GFRNONAA 28 (L) 11/30/2021   GFRAA 41 (L) 06/15/2018   NA 140 01/01/2022   K 5.1 01/01/2022   CALCIUM 8.8 01/01/2022   CO2 31 01/01/2022   GLUCOSE 170 (H) 01/01/2022    Lab Results  Component Value Date/Time   HGBA1C 8.0 (H) 11/29/2021 04:16 PM   HGBA1C endo 01/03/2019 12:00 AM   HGBA1C 7.7 (H) 05/02/2017 09:40 AM   GFR 27.85 (L) 01/01/2022 10:29 AM   GFR 34.72 (L) 12/17/2021 12:44 PM   MICROALBUR 78.0 (H) 04/22/2016 02:55 PM    Last diabetic Eye exam:  Lab Results  Component Value Date/Time   HMDIABEYEEXA No Retinopathy 03/31/2020 12:00 AM    Last diabetic Foot exam: No results found for: "HMDIABFOOTEX"   Lab Results  Component Value Date   CHOL 99 06/12/2020   HDL 33.20 (L) 06/12/2020   LDLCALC 46 06/10/2019   LDLDIRECT 46.0 06/12/2020   TRIG 209.0 (H) 06/12/2020   CHOLHDL 3 06/12/2020       Latest Ref Rng & Units 11/29/2021   12:53 PM 11/28/2021    3:37 PM 11/05/2021   11:17 AM  Hepatic Function  Total Protein 6.5 - 8.1 g/dL 5.3  5.4  5.5   Albumin 3.5 - 5.0 g/dL 2.8  3.4  3.3   AST 15 - 41 U/L 21  19  15    ALT 0 -  44 U/L 16  13  8    Alk Phosphatase 38 - 126 U/L 69  66  55   Total Bilirubin 0.3 - 1.2 mg/dL 1.0  0.4  0.6   Bilirubin, Direct 0.0 - 0.2 mg/dL 0.1   0.1     Lab Results  Component Value Date/Time   TSH 2.34 11/28/2021 03:37 PM   TSH 2.62 06/16/2020 11:18 AM   FREET4 1.46 06/16/2020 11:18 AM       Latest Ref Rng & Units 12/07/2021    1:37 PM 11/30/2021    2:22 AM 11/29/2021   12:53 PM  CBC  WBC 3.8 - 10.8 Thousand/uL 6.4  6.2  7.4   Hemoglobin 11.7 - 15.5 g/dL 16.111.0  09.610.4  04.510.6   Hematocrit 35.0 - 45.0 % 34.4  31.6  32.5   Platelets 140 - 400 Thousand/uL 179  177  185     No results found for: "VD25OH", "VITAMINB12"  Clinical ASCVD: Yes  The ASCVD Risk score (Arnett DK, et al., 2019) failed to calculate for the following reasons:   The 2019 ASCVD  risk score is only valid for ages 7940 to 2379   The patient has a prior MI or stroke diagnosis       05/31/2022    3:04 PM 05/10/2022    4:07 PM 11/28/2021    3:03 PM  Depression screen PHQ 2/9  Decreased Interest 0 0 0  Down, Depressed, Hopeless 0 0 0  PHQ - 2 Score 0 0 0     Social History   Tobacco Use  Smoking Status Never  Smokeless Tobacco Never   BP Readings from Last 3 Encounters:  08/05/22 124/62  07/30/22 128/78  07/12/22 136/62   Pulse Readings from Last 3 Encounters:  08/05/22 77  07/30/22 64  07/12/22 84   Wt Readings from Last 3 Encounters:  08/05/22 145 lb (65.8 kg)  07/30/22 147 lb 12.8 oz (67 kg)  07/12/22 145 lb 8 oz (66 kg)   BMI Readings from Last 3 Encounters:  08/05/22 27.40 kg/m  07/30/22 27.93 kg/m  07/12/22 27.49 kg/m    Allergies  Allergen Reactions   Ace Inhibitors Swelling    REACTION: tongue swelling   Lisinopril Swelling    Swelling of tongue   Insulin Detemir Itching   Nsaids Other (See Comments)    Renal issues   Codeine Nausea Only   Hydrochlorothiazide Other (See Comments)    Dizziness/funny feeling   Tylenol [Acetaminophen] Other (See Comments)    Fatty liver - use with caution    Medications Reviewed Today     Reviewed by Kathyrn SheriffFoltanski, Ramiya Delahunty N, [facility] HospitalRPH (Pharmacist) on 09/10/22 at 1611  Med List Status: <None>   Medication Order Taking? Sig Documenting Provider Last Dose Status Informant  albuterol (VENTOLIN HFA) 108 (90 Base) MCG/ACT inhaler [id number] Yes Inhale 2 puffs into the lungs every 6 (six) hours as needed for wheezing or shortness of breath. Mort Sawyersugal, Tabitha, FNP Taking Active   amLODipine (NORVASC) 2.5 MG tablet [id number] Yes Take 2.5 mg by mouth at bedtime. Tyler PitaKruska, Lindsay A, MD Taking Active   aspirin EC 81 MG tablet [id number] Yes Take 1 tablet (81 mg total) by mouth daily. Swallow whole. Antonieta IbaGollan, Timothy J, MD Taking Active Multiple Informants  atorvastatin (LIPITOR) 80 MG tablet [id number] Yes Take 1 tablet (80 mg  total) by mouth daily. Antonieta IbaGollan, Timothy J, MD Taking Active   carvedilol (COREG) 12.5 MG tablet [id number] Yes Take 1 tablet (12.5 mg total)  by mouth in the morning, at noon, and at bedtime. Antonieta Iba, MD Taking Active   cetirizine (ZYRTEC) 10 MG tablet [id number] Yes Take 10 mg by mouth daily. [provider] Taking Active Multiple Informants  clotrimazole-betamethasone (LOTRISONE) cream [id number] Yes Apply 1 application topically as needed (groin).  [provider] Taking Active Multiple Informants           Med Note Revonda Standard, AMBER B   Wed Jun 10, 2018  5:40 PM)    dapagliflozin propanediol (FARXIGA) 5 MG TABS tablet [id number] Yes Take 5 mg by mouth daily. [provider] Taking Active   Dulaglutide 4.5 MG/0.5ML SOPN [id number] Yes Inject 4.5 mg into the skin once a week. [provider] Taking Active            Med Note Lenice Llamas, Garry Heater   Tue Sep 10, 2022  4:10 PM) Taking 3 mg still - supply on hand  famotidine (PEPCID) 20 MG tablet [id number] Yes Take 1 tablet by mouth twice daily Tower, Audrie Gallus, MD Taking Active   gabapentin (NEURONTIN) 300 MG capsule [id number] Yes Take 1 capsule (300 mg total) by mouth 3 (three) times daily. Tower, Audrie Gallus, MD Taking Active   glimepiride (AMARYL) 2 MG tablet [id number] Yes Take 2 mg by mouth daily with breakfast. [provider] Taking Active   hydrALAZINE (APRESOLINE) 50 MG tablet [id number] Yes Take 1 tablet (50 mg total) by mouth 3 (three) times daily. Antonieta Iba, MD Taking Active   insulin degludec (TRESIBA) 100 UNIT/ML FlexTouch Pen [id number] Yes Inject 26 Units into the skin at bedtime. [provider] Taking Active   isosorbide mononitrate (IMDUR) 60 MG 24 hr tablet [id number] Yes Take 1 tablet (60 mg total) by mouth in the morning and at bedtime. Antonieta Iba, MD Taking Active   letrozole [facility]) 2.5 MG tablet [id number] Yes Take 2.5 mg by mouth daily. [provider] Taking  Active Multiple Informants  nystatin cream (MYCOSTATIN) [id number] Yes Apply 1 application topically 2 (two) times daily as needed (irritation.).  [provider] Taking Active Multiple Informants  pantoprazole (PROTONIX) 40 MG tablet [id number] Yes Take 1 tablet by mouth once daily Tower, Audrie Gallus, MD Taking Active Multiple Informants  Respiratory Therapy Supplies (NEBULIZER/TUBING/MOUTHPIECE) KIT [id number] Yes 1 each by Does not apply route 3 (three) times daily as needed. Mort Sawyers, FNP Taking Active   traMADol (ULTRAM) 50 MG tablet [id number] Yes TAKE 1 TO 2 TABLETS BY MOUTH EVERY 8 HOURS AS NEEDED FOR MODERATE TO SEVERE PAIN Tower, Audrie Gallus, MD Taking Active   [name] [facility]) 75 MG TABS [id number] Yes Take 1 tablet by mouth once daily Tower, Audrie Gallus, MD Taking Active   vitamin B-12 (CYANOCOBALAMIN) 1000 MCG tablet [id number] Yes Take 1,000 mcg by mouth daily.  [provider] Taking Active Multiple Informants  Vitamin D, Ergocalciferol, (DRISDOL) 1.25 MG (50000 UT) CAPS capsule [id number] Yes Take 50,000 Units by mouth every 7 (seven) days. [provider] Taking Active Multiple Informants           Med Note Para March, Dwana Curd   Fri Dec 07, 2021 12:51 PM)              SDOH:  (Social Determinants of Health) assessments and interventions performed: No SDOH Interventions    Flowsheet Row Clinical Support from 05/31/2022 in [facility] Chronic Care Management from 12/22/2020 in [facility] Clinical Support from  06/10/2019 in [facility] [place]  SDOH Interventions     Food Insecurity Interventions Intervention Not Indicated -- --  Housing Interventions Intervention Not Indicated -- --  Transportation Interventions Intervention Not Indicated -- --  Utilities Interventions Intervention Not Indicated -- --  Depression Interventions/Treatment  -- -- ZOX0-9 Score <4 Follow-up  Not Indicated  Financial Strain Interventions Intervention Not Indicated Intervention Not Indicated --  Stress Interventions Intervention Not Indicated -- --  Social Connections Interventions Intervention Not Indicated -- --       Medication Assistance:  Farxiga - [employer] grant (Cardiomyopathy) 12/25/21 - 12/25/22 ID: [id number] BIN: [id number] PCN: [id number] GROUP: [id number]  Ozempic/Tresiba - Novo Cares PAP faxed to endocrine 08/13/22  Medication Access: Within the past 30 days, how often has patient missed a dose of medication? 0 Is a pillbox or other method used to improve adherence? Yes  Factors that may affect medication adherence? financial need Are meds synced by current pharmacy? No  Are meds delivered by current pharmacy? No  Does patient experience delays in picking up medications due to transportation concerns? No   [facility] Services Reviewed: Is patient disadvantaged to use [facility]?: Yes  Current Rx insurance plan: [employer] Name and location of Current pharmacy:  [facility] [address] [place] - [address] [place] [address] Phone: [phone] Fax: [phone]  [facility] services reviewed with patient today?: No  Patient requests to transfer care to [facility]?: No  Reason patient declined to change pharmacies: Disadvantaged due to insurance/mail order  Compliance/Adherence/Medication fill history: Care Gaps: UACR  - done 11/02/21  Star-Rating Drugs: Atorvastatin - PDC 96% Farxiga - PDC 100% Glimepiride - PDC 62% Trulicity - PDC 80%    ASSESSMENT / PLAN  Hypertension / Heart Failure (BP goal <140/90) -Controlled - BP generally < 140/90 at home, improved with switch from [name] to [place] -Current home readings: not available -Denies hypotensive symptoms, but does report headache when BP is elevated. -Last ejection fraction: 45-50% (Date: 11/30/21) - improved from 15-20% (2015) -HF type: HFimpEF;  NYHA Class: II-III -Current treatment: Carvedilol 12.5 mg - 2 AM, 1 PM - Appropriate, Effective, Safe, Accessible Hydralazine 50 mg TID - Appropriate, Effective, Safe, Accessible Isosorbide MN 60 mg BID -Appropriate, Effective, Safe, Accessible Amlodipine 2.5 mg daily HS -Appropriate, Effective, Safe, Accessible Farxiga 5 mg daily (HW) - Appropriate, Effective, Safe, Accessible -Medications previously tried: furosemide, HCTZ, losartan, lisinopril (tongue swelling) -Educated on BP goals and benefits of medications for prevention of heart attack, stroke and kidney damage; -Counseled to monitor BP at home 1-2 x weekly -Reviewed finances - pt enrolled in [employer] for Farxiga (Cardiomyopathy) -Recommended to continue current medication  Hyperlipidemia: (LDL goal < 70) -Query Controlled - LDL 46 (06/2020) at goal, due for repeat lipid panel -Hx stroke, NICM -Current treatment: Atorvastatin 80 mg daily - Appropriate, Effective, Safe, Accessible Aspirin 81 mg daily - Appropriate, Effective, Safe, Accessible -Medications previously tried: pravastatin  -Educated on Cholesterol goals;  -Recommended to continue current medication; repeat lipid panel at next OV  Diabetes (A1c goal <8%) -Uncontrolled, improving- A1c 9.2% (09/2022); Trulicity was increased to 4.5 mg recently per endocrine, but she has not started this yet as she has a lot of 3 mg pens left on hand and wants to finish these first; we are also in process of applying for Novo Cares PAP to switch to Ozempic, forms have been faxed -Current home glucose readings: (Libre 3): reports unavailable -Current medications:  Farxiga 5 mg daily (HW)- Appropriate, Query Effective (for DM) Glimepiride 1 mg daily AM -Appropriate, Effective, Query Safe Tresiba 26 units AM -Appropriate, Effective, Query Safe Trulicity 4.5 mg weekly - Appropriate, Query Effective Freestyle Libre 3 w/ reader - Appropriate, Effective, Safe, Accessible -Medications  previously tried: Theme park manager (cost), Tradjenta (started Trulicity), metformin (GFR < 30) -Reviewed [name] is likely not very effective for DM control with GFR <30, though it should still remain on board for help with HF and CKD -Discussed diet at length - advised to try to keep snacks to < 15 g of carbs, maximize protein/vegetables regularly and avoid large portions of carbs -Reviewed plan to switch Trulicity to Ozempic once it is approved through [employer]; asked patient to bring proof of income to office in case Novo asks for it -Recommend to continue current medication for now, until [employer] update  Chronic Kidney Disease Stage 4  -All medications assessed for renal dosing and appropriateness in chronic kidney disease. -Pt has 1 kidney - she donated 1 to her brother -Pt is on [place]. Pt is not on ACE/ARB due to hx of tongue swelling with lisinopril. -Recommended to continue current medication  OAB (Goal: minimize nocturia) -Not ideally controlled - pt has switched from [name] to [place] due to high BP issues; she reports OAB control is about the same, still urinates frequently -[name] tier exception reduced price from $100 to $35 -Current treatment  [name] 70 mg daily - Appropriate, Effective, Safe, Accessible -Medications previously tried: Toviaz, oxybutynin, tolterodine  -Reviewed benefits of PT -Recommend to continue current medication  Health Maintenance -Vaccine gaps: None -GERD: on pantoprazole, famotidine -Hx Breast cancer: follows with [facility], on letrozole 7-year course that will finish in 2024 -Hx Neuropathy: on gabapentin   Al Corpus, PharmD, BCACP Clinical Pharmacist [facility] [phone]

## 2022-09-10 NOTE — Telephone Encounter (Signed)
Submitted application for [facility]  AND TRESIBA to N[employer]O N[employer]K for patient assistance.   Phone: [phone]  Georga Bora Rx Patient Advocate [phone]) [phone]

## 2022-09-10 NOTE — Patient Instructions (Signed)
Visit Information  Phone number for Pharmacist: [phone]  Thank you for meeting with me to discuss your medications! Below is a summary of what we talked about during the visit:   Recommendations/Changes made from today's visit: -No med changes; discussed plan to change to Ozempic (PAP pending - advised pt to bring income documents to office so we can fax to [employer])  Follow up plan: -Pharmacist follow up televisit scheduled for 1 month   Al Corpus, PharmD, [employer] Clinical Pharmacist [facility] [phone]

## 2022-09-13 NOTE — Telephone Encounter (Signed)
Received notification from [employer] regarding approval for [facility] AND.OZEMPIC Patient assistance approved from 0411/2024 to 06/03/2023.  Phone: [phone]  Georga Bora Rx Patient Advocate [phone]) [phone]

## 2022-09-17 ENCOUNTER — Telehealth: Payer: Self-pay | Admitting: Pharmacist

## 2022-09-17 NOTE — Telephone Encounter (Signed)
Received message from patient, she requests the phone number to contact [facility] for sensor malfunction. Unable to reach patient, left VM with phone number for her to call: [phone]

## 2022-09-18 ENCOUNTER — Other Ambulatory Visit: Payer: Self-pay | Admitting: Family Medicine

## 2022-09-18 NOTE — Progress Notes (Signed)
Contacted the patient. She has the number to reach out to [facility] assistance for sensor malfunction.   Al Corpus, PharmD notified  Burt Knack, [facility] Clinical Pharmacy Assistant [phone]

## 2022-09-19 ENCOUNTER — Telehealth: Payer: Self-pay | Admitting: Pharmacist

## 2022-09-19 NOTE — Telephone Encounter (Signed)
Received call from patient with several concerns.  She reports she received a letter from [place] (dated Jul 15 2022) that Farxiga coverage was denied and she will have to pay over $500 for it. Patient was approved for Cardiomyopathy [employer] grant earlier this year that covers [name] so her cost should be $0. I tried to submit PA for [place] but the response is that PA is not needed. Advised pt her cost at the pharmacy for [name] should be $0 with the [employer] grant applied.  She reports [name] cost will be $390 for 90 day supply. Earlier this year tier exception was approved that lowered cost, but she is in donut hole now so cost is much higher again. There is nothing further available to help with cost of [name], advised pt she can stop taking it if it is not affordable.  Pt needs help getting Freestyle Libre sensors, her last one fell off 8 days early. Advised she can call [employer] for a free replacement sensor since hers malfunctioned. Advised she call [facility] for regular refills of sensors.  Pt voiced understanding of above and denies further concerns.

## 2022-09-25 DIAGNOSIS — E1165 Type 2 diabetes mellitus with hyperglycemia: Secondary | ICD-10-CM | POA: Diagnosis not present

## 2022-09-25 DIAGNOSIS — Z794 Long term (current) use of insulin: Secondary | ICD-10-CM | POA: Diagnosis not present

## 2022-10-07 ENCOUNTER — Encounter: Payer: Self-pay | Admitting: Cardiovascular Disease

## 2022-10-09 ENCOUNTER — Encounter: Payer: Self-pay | Admitting: Family Medicine

## 2022-10-10 ENCOUNTER — Encounter: Payer: Medicare HMO | Admitting: Pharmacist

## 2022-10-23 ENCOUNTER — Encounter: Payer: Self-pay | Admitting: Family Medicine

## 2022-10-23 ENCOUNTER — Ambulatory Visit (INDEPENDENT_AMBULATORY_CARE_PROVIDER_SITE_OTHER): Payer: Medicare HMO | Admitting: Internal Medicine

## 2022-10-23 ENCOUNTER — Encounter: Payer: Self-pay | Admitting: Internal Medicine

## 2022-10-23 VITALS — BP 136/84 | HR 73 | Temp 97.2°F | Ht 61.0 in | Wt 151.0 lb

## 2022-10-23 DIAGNOSIS — N3000 Acute cystitis without hematuria: Secondary | ICD-10-CM | POA: Insufficient documentation

## 2022-10-23 DIAGNOSIS — R300 Dysuria: Secondary | ICD-10-CM | POA: Diagnosis not present

## 2022-10-23 LAB — POC URINALSYSI DIPSTICK (AUTOMATED)
Bilirubin, UA: NEGATIVE
Blood, UA: POSITIVE
Glucose, UA: POSITIVE — AB
Ketones, UA: NEGATIVE
Nitrite, UA: POSITIVE
Protein, UA: POSITIVE — AB
Spec Grav, UA: 1.01 (ref 1.010–1.025)
Urobilinogen, UA: 0.2 E.U./dL
pH, UA: 6.5 (ref 5.0–8.0)

## 2022-10-23 MED ORDER — CEPHALEXIN 500 MG PO CAPS: 500.0000 mg | ORAL_CAPSULE | Freq: Three times a day (TID) | ORAL | 1 refills | Status: DC

## 2022-10-23 NOTE — Progress Notes (Signed)
Subjective:    Patient ID: Morgan Hubbard, female    DOB: 28-Aug-1934, 88 y.o.   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 8 g 0   amLODipine (NORVASC) 2.5 MG tablet Take 2.5 mg by mouth at bedtime.     aspirin EC 81 MG tablet Take 1 tablet (81 mg total) by mouth daily. Swallow whole. 90 tablet 3   atorvastatin (LIPITOR) 80 MG tablet Take 1 tablet (80 mg total) by mouth daily. 90 tablet 3   carvedilol (COREG) 12.5 MG tablet Take 1 tablet (12.5 mg total) by mouth in the morning, at noon, and at bedtime. 270 tablet 3   cetirizine (ZYRTEC) 10 MG tablet Take 10 mg by mouth daily.     clotrimazole-betamethasone (LOTRISONE) cream Apply 1 application topically as needed (groin).      dapagliflozin propanediol (FARXIGA) 5 MG TABS tablet Take 5 mg by mouth daily.     Dulaglutide 4.5 MG/0.5ML SOPN Inject 4.5 mg into the skin once a week.     famotidine (PEPCID) 20 MG tablet Take 1 tablet by mouth twice daily 180 tablet 1   gabapentin (NEURONTIN) 300 MG capsule Take 1 capsule (300 mg total) by mouth 3 (three) times daily. 270 capsule 3   glimepiride (AMARYL) 2 MG tablet Take 2 mg by mouth daily with breakfast.     hydrALAZINE (APRESOLINE) 50 MG tablet Take 1 tablet (50 mg total) by mouth 3 (three) times daily. 270 tablet 3   insulin degludec (TRESIBA) 100 UNIT/ML FlexTouch Pen Inject 26 Units into the skin at bedtime.     isosorbide mononitrate (IMDUR) 60 MG 24 hr tablet Take 1 tablet (60 mg total) by mouth in the morning and at bedtime. 180 tablet 3   letrozole  (FEMARA) 2.5 MG tablet Take 2.5 mg by mouth daily.     nystatin cream (MYCOSTATIN) Apply 1 application topically 2 (two) times daily as needed (irritation.).      pantoprazole (PROTONIX) 40 MG tablet Take 1 tablet by mouth once daily 90 tablet 1   Respiratory Therapy Supplies (NEBULIZER/TUBING/MOUTHPIECE) KIT 1 each by Does not apply route 3 (three) times daily as needed. 1 kit 0   traMADol (ULTRAM) 50 MG tablet TAKE 1 TO 2 TABLETS BY MOUTH EVERY 8 HOURS AS NEEDED FOR MODERATE TO SEVERE PAIN 60 tablet 0   [name] ([name]) 75 MG TABS Take 1 tablet by mouth once daily 90 tablet 2   vitamin B-12 (CYANOCOBALAMIN) 1000 MCG tablet Take 1,000 mcg by mouth daily.      Vitamin D, Ergocalciferol, (DRISDOL) 1.25 MG (50000 UT) CAPS capsule Take 50,000 Units by mouth every 7 (seven) days.     No current facility-administered medications on file prior to visit.    Allergies  Allergen Reactions   Ace Inhibitors Swelling    REACTION: tongue swelling   Lisinopril Swelling    Swelling of tongue   Insulin Detemir Itching   Nsaids Other (See Comments)    Renal issues   Codeine Nausea Only   Hydrochlorothiazide  Other (See Comments)    Dizziness/funny feeling   Tylenol [Acetaminophen] Other (See Comments)    Fatty liver - use with caution    Past Medical History:  Diagnosis Date   Arthritis    "hands" (01/13/2014)   Basal cell carcinoma 01/2014   "bridge of nose"   Cardiac LV ejection fraction >40%    "it was 43 last year" (01/13/2014)   Chronic lower back pain    CKD (chronic kidney disease), stage III (HCC)    acute on chronic stage III/notes 06/10/2018   Colon polyps    Expressive aphasia    "3 times in the last week" (01/13/2014)   GERD (gastroesophageal reflux disease)    Goiter past remote   treated with RI   Hyperlipidemia    Hyperpotassemia    Hypertension    Hypertonicity of bladder    Inflammatory and toxic neuropathy, unspecified    LBBB (left bundle branch block)    Migraine     "stopped many years ago" (01/13/2014)   Mini stroke 01/2014   Other primary cardiomyopathies    Overactive bladder    Presence of combination internal cardiac defibrillator (ICD) and pacemaker    Reflex sympathetic dystrophy, unspecified    Shingles    Shortness of breath    ambulation   Stroke (HCC)    Thyroid disease    Type II diabetes mellitus (HCC)    Ulcer    Urine incontinence    Vertigo    hx of    Past Surgical History:  Procedure Laterality Date   BACK SURGERY     BI-VENTRICULAR PACEMAKER INSERTION (CRT-P)  10/2014   [facility]   BREAST CYST EXCISION Left 1959   CARDIAC CATHETERIZATION  "several"   [place]   CATARACT EXTRACTION W/ INTRAOCULAR LENS IMPLANT Left ~ 2005   several eye injections   CATARACT EXTRACTION W/PHACO Right 05/04/2020   Procedure: CATARACT EXTRACTION PHACO AND INTRAOCULAR LENS PLACEMENT (IOC) RIGHT DIABETIC;  Surgeon: Galen Manila, MD;  Location: [facility] ORS;  Service: Ophthalmology;  Laterality: Right;  [place] 00:58.5 CDE 6.70 Fluid Pack [place] # [id number]   CHOLECYSTECTOMY N/A 06/13/2018   Procedure: LAPAROSCOPIC CHOLECYSTECTOMY;  Surgeon: Manus Rudd, MD;  Location: [facility] OR;  Service: General;  Laterality: N/A;   COLONOSCOPY  7/12   normal (hx of polyps in past)    CT SCAN  3/12   outside hosp- lung nodule and gallstones   ERCP N/A 06/12/2018   Procedure: ENDOSCOPIC RETROGRADE CHOLANGIOPANCREATOGRAPHY (ERCP);  Surgeon: Jeani Hawking, MD;  Location: [facility] ENDOSCOPY;  Service: Endoscopy;  Laterality: N/A;   FINGER SURGERY Left 1959 & 1976   "knot on index"   KIDNEY DONATION Left 1989   LUMBAR LAMINECTOMY/DECOMPRESSION MICRODISCECTOMY  1999   LUMBAR LAMINECTOMY/DECOMPRESSION MICRODISCECTOMY  01/31/2012   Procedure: LUMBAR LAMINECTOMY/DECOMPRESSION MICRODISCECTOMY 2 LEVELS;  Surgeon: Tia Alert, MD;  Location: [facility] NEURO ORS;  Service: Neurosurgery;  Laterality: N/A;  Thoracic twelve-lumbar one, lumbar one-two laminectomy    POSTERIOR LAMINECTOMY /  DECOMPRESSION CERVICAL SPINE  1999   REMOVAL OF STONES  06/12/2018   Procedure: REMOVAL OF STONES;  Surgeon: Jeani Hawking, MD;  Location: [facility] ENDOSCOPY;  Service: Endoscopy;;   SPHINCTEROTOMY  06/12/2018   Procedure: Dennison Mascot;  Surgeon: Jeani Hawking, MD;  Location: [facility] ENDOSCOPY;  Service: Endoscopy;;   TUBAL LIGATION  1976   UPPER GASTROINTESTINAL ENDOSCOPY      Family History  Problem Relation Age of Onset   Arthritis Mother    Cancer Mother  uterine and mouth   Hyperlipidemia Mother    Stroke Mother    Hypertension Mother    Alcohol abuse Father    Diabetes Father    Cancer Sister        breast   Diabetes Sister    Hyperlipidemia Sister    Heart disease Sister    Hypertension Sister    Diabetes Sister    Cancer Brother        lung cancer   Kidney disease Brother    Diabetes Brother    Hyperlipidemia Brother    Kidney failure Brother    Diabetes Daughter    Heart attack Daughter    Addison's disease Other     Social History   Socioeconomic History   Marital status: Married    Spouse name: Not on file   Number of children: 2   Years of education: Not on file   Highest education level: Not on file  Occupational History   Occupation: retired - Freight forwarder   Tobacco Use   Smoking status: Never   Smokeless tobacco: Never  Vaping Use   Vaping Use: Never used  Substance and Sexual Activity   Alcohol use: No    Alcohol/week: 0.0 standard drinks of alcohol   Drug use: No   Sexual activity: Yes  Other Topics Concern   Not on file  Social History Narrative   Married 2nd time   Daughter is Therapist, occupational    Social Determinants of Health   Financial Resource Strain: Low Risk  (05/31/2022)   Overall Financial Resource Strain (CARDIA)    Difficulty of Paying Living Expenses: Not hard at all  Food Insecurity: No Food Insecurity (05/31/2022)   Hunger Vital Sign    Worried About Running Out of Food in the Last Year: Never true    Ran Out of Food in  the Last Year: Never true  Transportation Needs: No Transportation Needs (05/31/2022)   PRAPARE - Administrator, Civil Service (Medical): No    Lack of Transportation (Non-Medical): No  Physical Activity: Inactive (05/30/2021)   Exercise Vital Sign    Days of Exercise per Week: 0 days    Minutes of Exercise per Session: 0 min  Stress: No Stress Concern Present (05/31/2022)   [employer] of Occupational Health - Occupational Stress Questionnaire    Feeling of Stress : Not at all  Social Connections: Moderately Isolated (05/31/2022)   Social Connection and Isolation Panel [NHANES]    Frequency of Communication with Friends and Family: More than three times a week    Frequency of Social Gatherings with Friends and Family: Three times a week    Attends Religious Services: Never    Active Member of Clubs or Organizations: No    Attends Banker Meetings: Never    Marital Status: Married  Catering manager Violence: Not At Risk (05/31/2022)   Humiliation, Afraid, Rape, and Kick questionnaire    Fear of Current or Ex-Partner: No    Emotionally Abused: No    Physically Abused: No    Sexually Abused: No   Review of Systems Travelling to the beach this summer Skin seems more sensitive in the past 2 days also Lost daughter 2 days ago    Objective:   Physical Exam Constitutional:      Appearance: Normal appearance.  Abdominal:     Palpations: Abdomen is soft.     Tenderness: There is no right CVA tenderness or left CVA tenderness.  Comments: Mild suprapubic tenderness  Neurological:     Mental Status: She is alert.            Assessment & Plan:

## 2022-10-23 NOTE — Assessment & Plan Note (Signed)
Urinalysis shows 3+ leuks, 1+ nitrite-some blood on dip Classic symptoms of just bladder infection Will try cephalexin 500 tid for 3 days (with refill) Send culture

## 2022-10-23 NOTE — Telephone Encounter (Signed)
Appt scheduled with Dr. Alphonsus Sias today to check urinary sxs out  FYI to PCP

## 2022-10-23 NOTE — Addendum Note (Signed)
Addended by: Eual Fines on: 10/23/2022 12:28 PM   Modules accepted: Orders

## 2022-10-25 LAB — URINE CULTURE
MICRO NUMBER:: 14990291
SPECIMEN QUALITY:: ADEQUATE

## 2022-10-31 ENCOUNTER — Ambulatory Visit: Payer: Medicare HMO | Admitting: Pharmacist

## 2022-10-31 NOTE — Progress Notes (Signed)
Care Management & Coordination Services Pharmacy Note  11/05/2022 Name:  Morgan Hubbard MRN:  [id number] DOB:  24-May-1935  Summary: F/u visit -DM: A1c 9.2% (09/2022); pt wearing Freestyle Libre, TIR was 49% (improved from 36% last month) and endocrine increased Trulicity to 4.5 mg, however pt has not started higher dose yet because she wants to finish 3 mg pens on hand first; also, planning to switch Trulicity to Ozempic to get it through PAP (application in process through endocrine) -HF / HTN: BP at goal in recent OV  Recommendations/Changes made from today's visit: -No med changes -  Follow up plan: -Pharmacist follow up televisit scheduled for 1 month -Urology 11/01/22; Endocrine June 2024;     Subjective: Morgan Hubbard is an 88 y.o. year old female who is a primary patient of Tower, Audrie Gallus, MD.  The care coordination team was consulted for assistance with disease management and care coordination needs.    Engaged with patient by telephone for follow up visit.  Recent office visits: 10/23/22 Dr Alphonsus Sias OV: UTI - Rx keflex x 3 days  08/05/22 Dr Milinda Antis OV: bronchitis - rx albuterol HFA.  07/30/22 NP Wyatt Mage Dugal OV: cough, wheezing - strep negative, CXR negative. rx augmentin and albuterol.  07/12/22 Dr Milinda Antis OV: f/u PNA. Try PPI QOD.   06/21/22 Dr Milinda Antis OV: f/u PNA - clinically improved. referral for [facility] for strength 06/14/22 Dr Milinda Antis OV: f/u PNA - clinically improved. Referral to ENT for OM/ETD. 06/06/22 NP Dugal OV: PNA - Rx augmentin, z-pak.  Recent consult visits: 09/09/22 Dr Tedd Sias (Endocrine): f/u - A1c 9.2% (4/24)  07/18/22 Endocrine TE - ok to switch Trulicity to Ozempic (PAP)  07/08/22 Dr Tedd Sias (Endocrine): DM - CGM report 34% TIR. Increase Trulicity to 4.5 mg weekly. Do not restart metformin.  07/03/22 Dr Elesa Massed (Cardiology): f/u NICM - euvolemic. No changes  Hospital visits: None in previous 6 months   Objective:  Lab Results  Component Value Date   CREATININE 1.67 (H)  01/01/2022   BUN 30 (H) 01/01/2022   GFR 27.85 (L) 01/01/2022   GFRNONAA 28 (L) 11/30/2021   GFRAA 41 (L) 06/15/2018   NA 140 01/01/2022   K 5.1 01/01/2022   CALCIUM 8.8 01/01/2022   CO2 31 01/01/2022   GLUCOSE 170 (H) 01/01/2022    Lab Results  Component Value Date/Time   HGBA1C 8.0 (H) 11/29/2021 04:16 PM   HGBA1C endo 01/03/2019 12:00 AM   HGBA1C 7.7 (H) 05/02/2017 09:40 AM   GFR 27.85 (L) 01/01/2022 10:29 AM   GFR 34.72 (L) 12/17/2021 12:44 PM   MICROALBUR 78.0 (H) 04/22/2016 02:55 PM    Last diabetic Eye exam:  Lab Results  Component Value Date/Time   HMDIABEYEEXA No Retinopathy 03/31/2020 12:00 AM    Last diabetic Foot exam: No results found for: "HMDIABFOOTEX"   Lab Results  Component Value Date   CHOL 99 06/12/2020   HDL 33.20 (L) 06/12/2020   LDLCALC 46 06/10/2019   LDLDIRECT 46.0 06/12/2020   TRIG 209.0 (H) 06/12/2020   CHOLHDL 3 06/12/2020       Latest Ref Rng & Units 11/29/2021   12:53 PM 11/28/2021    3:37 PM 11/05/2021   11:17 AM  Hepatic Function  Total Protein 6.5 - 8.1 g/dL 5.3  5.4  5.5   Albumin 3.5 - 5.0 g/dL 2.8  3.4  3.3   AST 15 - 41 U/L 21  19  15    ALT 0 - 44 U/L 16  13  8   Alk Phosphatase 38 - 126 U/L 69  66  55   Total Bilirubin 0.3 - 1.2 mg/dL 1.0  0.4  0.6   Bilirubin, Direct 0.0 - 0.2 mg/dL 0.1   0.1     Lab Results  Component Value Date/Time   TSH 2.34 11/28/2021 03:37 PM   TSH 2.62 06/16/2020 11:18 AM   FREET4 1.46 06/16/2020 11:18 AM       Latest Ref Rng & Units 12/07/2021    1:37 PM 11/30/2021    2:22 AM 11/29/2021   12:53 PM  CBC  WBC 3.8 - 10.8 Thousand/uL 6.4  6.2  7.4   Hemoglobin 11.7 - 15.5 g/dL 16.1  09.6  04.5   Hematocrit 35.0 - 45.0 % 34.4  31.6  32.5   Platelets 140 - 400 Thousand/uL 179  177  185     No results found for: "VD25OH", "VITAMINB12"  Clinical ASCVD: Yes  The ASCVD Risk score (Arnett DK, et al., 2019) failed to calculate for the following reasons:   The 2019 ASCVD risk score is only valid for  ages 56 to 59   The patient has a prior MI or stroke diagnosis       05/31/2022    3:04 PM 05/10/2022    4:07 PM 11/28/2021    3:03 PM  Depression screen PHQ 2/9  Decreased Interest 0 0 0  Down, Depressed, Hopeless 0 0 0  PHQ - 2 Score 0 0 0     Social History   Tobacco Use  Smoking Status Never  Smokeless Tobacco Never   BP Readings from Last 3 Encounters:  10/23/22 136/84  08/05/22 124/62  07/30/22 128/78   Pulse Readings from Last 3 Encounters:  10/23/22 73  08/05/22 77  07/30/22 64   Wt Readings from Last 3 Encounters:  10/23/22 151 lb (68.5 kg)  08/05/22 145 lb (65.8 kg)  07/30/22 147 lb 12.8 oz (67 kg)   BMI Readings from Last 3 Encounters:  10/23/22 28.53 kg/m  08/05/22 27.40 kg/m  07/30/22 27.93 kg/m    Allergies  Allergen Reactions   Ace Inhibitors Swelling    REACTION: tongue swelling   Lisinopril Swelling    Swelling of tongue   Insulin Detemir Itching   Nsaids Other (See Comments)    Renal issues   Codeine Nausea Only   Hydrochlorothiazide Other (See Comments)    Dizziness/funny feeling   Tylenol [Acetaminophen] Other (See Comments)    Fatty liver - use with caution    Medications Reviewed Today     Reviewed by Kathyrn Sheriff, [name] (Pharmacist) on 11/05/22 at 1638  Med List Status: <None>   Medication Order Taking? Sig Documenting Provider Last Dose Status Informant  albuterol (VENTOLIN HFA) 108 (90 Base) MCG/ACT inhaler [id number] Yes Inhale 2 puffs into the lungs every 6 (six) hours as needed for wheezing or shortness of breath. Mort Sawyers, FNP Taking Active   amLODipine (NORVASC) 2.5 MG tablet [id number] Yes Take 2.5 mg by mouth at bedtime. Tyler Pita, MD Taking Active   aspirin EC 81 MG tablet [id number] Yes Take 1 tablet (81 mg total) by mouth daily. Swallow whole. Antonieta Iba, MD Taking Active Multiple Informants  atorvastatin (LIPITOR) 80 MG tablet [id number] Yes Take 1 tablet (80 mg total) by mouth daily. Antonieta Iba, MD Taking Active   carvedilol (COREG) 12.5 MG tablet [id number] Yes Take 1 tablet (12.5 mg total) by mouth in the morning, at  noon, and at bedtime. Antonieta Iba, MD Taking Active   cephALEXin [facility]) 500 MG capsule [id number] Yes Take 1 capsule (500 mg total) by mouth 3 (three) times daily. Karie Schwalbe, MD Taking Active   cetirizine (ZYRTEC) 10 MG tablet [id number] Yes Take 10 mg by mouth daily. [provider] Taking Active Multiple Informants  clotrimazole-betamethasone (LOTRISONE) cream [id number] Yes Apply 1 application topically as needed (groin).  [provider] Taking Active Multiple Informants           Med Note Revonda Standard, AMBER B   Wed Jun 10, 2018  5:40 PM)    dapagliflozin propanediol (FARXIGA) 5 MG TABS tablet [id number] Yes Take 5 mg by mouth daily. [provider] Taking Active   famotidine (PEPCID) 20 MG tablet [id number] Yes Take 1 tablet by mouth twice daily Tower, Audrie Gallus, MD Taking Active   gabapentin (NEURONTIN) 300 MG capsule [id number] Yes Take 1 capsule (300 mg total) by mouth 3 (three) times daily. Tower, Audrie Gallus, MD Taking Active   glimepiride (AMARYL) 2 MG tablet [id number] Yes Take 2 mg by mouth daily with breakfast. [provider] Taking Active   hydrALAZINE (APRESOLINE) 50 MG tablet [id number] Yes Take 1 tablet (50 mg total) by mouth 3 (three) times daily. Antonieta Iba, MD Taking Active   insulin degludec (TRESIBA) 100 UNIT/ML FlexTouch Pen [id number] Yes Inject 26 Units into the skin at bedtime. [provider] Taking Active            Med Note Kathyrn Sheriff   Tue Nov 05, 2022  4:30 PM) Novo Cares PAP - Dr Tedd Sias  isosorbide mononitrate (IMDUR) 60 MG 24 hr tablet [id number] Yes Take 1 tablet (60 mg total) by mouth in the morning and at bedtime. Antonieta Iba, MD Taking Active   letrozole [facility]) 2.5 MG tablet [id number] Yes Take 2.5 mg by mouth daily. [provider] Taking Active Multiple  Informants  nystatin cream (MYCOSTATIN) [id number] Yes Apply 1 application topically 2 (two) times daily as needed (irritation.).  [provider] Taking Active Multiple Informants  pantoprazole (PROTONIX) 40 MG tablet [id number] Yes Take 1 tablet by mouth once daily Tower, Audrie Gallus, MD Taking Active Multiple Informants  Respiratory Therapy Supplies (NEBULIZER/TUBING/MOUTHPIECE) KIT [id number] Yes 1 each by Does not apply route 3 (three) times daily as needed. Mort Sawyers, FNP Taking Active   Semaglutide, 2 MG/DOSE, (OZEMPIC, 2 MG/DOSE,) 8 MG/3ML SOPN [id number] Yes Inject 2 mg into the skin once a week. Raj Janus, MD  Active            Med Note Kathyrn Sheriff   Tue Nov 05, 2022  4:37 PM) Novo Cares PAP - Dr Tedd Sias  traMADol (ULTRAM) 50 MG tablet [id number]  TAKE 1 TO 2 TABLETS BY MOUTH EVERY 8 HOURS AS NEEDED FOR MODERATE TO SEVERE PAIN Tower, Audrie Gallus, MD  Active   [name] [facility]) 75 MG TABS [id number] Yes Take 1 tablet by mouth once daily Tower, Audrie Gallus, MD Taking Active   vitamin B-12 (CYANOCOBALAMIN) 1000 MCG tablet [id number] Yes Take 1,000 mcg by mouth daily.  [provider] Taking Active Multiple Informants  Vitamin D, Ergocalciferol, (DRISDOL) 1.25 MG (50000 UT) CAPS capsule [id number] Yes Take 50,000 Units by mouth every 7 (seven) days. [provider] Taking Active Multiple Informants           Med Note Joaquim Nam   Fri Dec 07, 2021 12:51 PM)  SDOH:  (Social Determinants of Health) assessments and interventions performed: No SDOH Interventions    Flowsheet Row Clinical Support from 05/31/2022 in [facility] Chronic Care Management from 12/22/2020 in [facility] Clinical Support from 06/10/2019 in [facility] [place]  SDOH Interventions     Food Insecurity Interventions Intervention Not Indicated -- --  Housing Interventions Intervention  Not Indicated -- --  Transportation Interventions Intervention Not Indicated -- --  Utilities Interventions Intervention Not Indicated -- --  Depression Interventions/Treatment  -- -- WUJ8-1 Score <4 Follow-up Not Indicated  Financial Strain Interventions Intervention Not Indicated Intervention Not Indicated --  Stress Interventions Intervention Not Indicated -- --  Social Connections Interventions Intervention Not Indicated -- --       Medication Assistance:  [name] - [employer] grant (Cardiomyopathy) 12/25/21 - 12/25/22 ID: [id number] BIN: [id number] PCN: [id number] GROUP: [id number]  Ozempic/Tresiba - Novo Cares PAP approved 2024 (endocrine)  Medication Access: Within the past 30 days, how often has patient missed a dose of medication? 0 Is a pillbox or other method used to improve adherence? Yes  Factors that may affect medication adherence? financial need Are meds synced by current pharmacy? No  Are meds delivered by current pharmacy? No  Does patient experience delays in picking up medications due to transportation concerns? No  Current Rx insurance plan: [employer] Name and location of Current pharmacy:  [facility] [address], [place] - [address] [place] [address] Phone: [phone] Fax: [phone]  Compliance/Adherence/Medication fill history: Care Gaps: UACR  - done 11/02/21  Star-Rating Drugs: Atorvastatin - PDC 96% Farxiga - PDC 100% Glimepiride - PDC 62% Trulicity - PDC 80%    ASSESSMENT / PLAN  Hypertension / Heart Failure (BP goal <140/90) -Controlled - BP generally < 140/90 at home, improved with switch from [name] to [place] -Current home readings: not available -Denies hypotensive symptoms, but does report headache when BP is elevated. -Last ejection fraction: 45-50% (Date: 11/30/21) - improved from 15-20% (2015) -HF type: HFimpEF; NYHA Class: II-III -Current treatment: Carvedilol 12.5 mg - 2 AM, 1 PM - Appropriate,  Effective, Safe, Accessible Hydralazine 50 mg TID - Appropriate, Effective, Safe, Accessible Isosorbide MN 60 mg BID -Appropriate, Effective, Safe, Accessible Amlodipine 2.5 mg daily HS -Appropriate, Effective, Safe, Accessible Farxiga 5 mg daily (HW) - Appropriate, Effective, Safe, Accessible -Medications previously tried: furosemide, HCTZ, losartan, lisinopril (tongue swelling) -Educated on BP goals and benefits of medications for prevention of heart attack, stroke and kidney damage; -Counseled to monitor BP at home 1-2 x weekly -Reviewed finances - pt enrolled in [employer] for Farxiga (Cardiomyopathy) -Recommended to continue current medication  Hyperlipidemia: (LDL goal < 70) -Query Controlled - LDL 46 (06/2020) at goal, due for repeat lipid panel -Hx stroke, NICM -Current treatment: Atorvastatin 80 mg daily - Appropriate, Effective, Safe, Accessible Aspirin 81 mg daily - Appropriate, Effective, Safe, Accessible -Medications previously tried: pravastatin  -Educated on Cholesterol goals;  -Recommended to continue current medication; repeat lipid panel at next OV  Diabetes (A1c goal <8%) -Uncontrolled, improving- A1c 9.2% (09/2022) -Current home glucose readings: (Libre 3): 56-63 this morning;  -Pt sleeps late (10am) and eats something then;  -Current medications: Farxiga 5 mg daily (HW)- Appropriate, Query Effective (for DM) Glimepiride 1 mg daily AM - Appropriate, Effective, Query Safe Tresiba 26 units AM (PAP) - Appropriate, Effective, Query Safe Ozempic 2 mg weekly (PAP) - Appropriate, Query Effective Freestyle [employer]  3 w/ reader Esmond Plants) - Appropriate, Effective, Safe, Accessible -Medications previously tried: Theme park manager (cost), Tradjenta (started Trulicity), metformin (GFR < 30), Trulicity (ineffective) -Reviewed [name] is likely not very effective for DM control with GFR ~30, though it should still remain on board for help with HF and CKD -Discussed diet at length -  advised to try to keep snacks to < 15 g of carbs, maximize protein/vegetables regularly and avoid large portions of carbs -Recommend to continue current medication for now  Chronic Kidney Disease Stage 4  -All medications assessed for renal dosing and appropriateness in chronic kidney disease. -Pt has 1 kidney - she donated 1 to her brother -Pt is on [place]. Pt is not on ACE/ARB due to hx of tongue swelling with lisinopril. -Recommended to continue current medication  OAB (Goal: minimize nocturia) -Not ideally controlled - pt has switched from [name] to [place] due to high BP issues; she reports OAB control is about the same, still urinates frequently -[name] tier exception reduced price from $100 to $35 -Current treatment  [name] 70 mg daily - Appropriate, Effective, Safe, Accessible -Medications previously tried: Toviaz, oxybutynin, tolterodine  -Reviewed benefits of PT -Recommend to continue current medication  Health Maintenance -Vaccine gaps: None -GERD: on pantoprazole, famotidine -Hx Breast cancer: follows with [facility], on letrozole 7-year course that will finish in 2024 -Hx Neuropathy: on gabapentin   Al Corpus, PharmD, BCACP Clinical Pharmacist [employer] [facility] [phone]

## 2022-11-01 DIAGNOSIS — D649 Anemia, unspecified: Secondary | ICD-10-CM | POA: Diagnosis not present

## 2022-11-01 DIAGNOSIS — N184 Chronic kidney disease, stage 4 (severe): Secondary | ICD-10-CM | POA: Diagnosis not present

## 2022-11-01 DIAGNOSIS — R809 Proteinuria, unspecified: Secondary | ICD-10-CM | POA: Diagnosis not present

## 2022-11-01 DIAGNOSIS — E875 Hyperkalemia: Secondary | ICD-10-CM | POA: Diagnosis not present

## 2022-11-01 DIAGNOSIS — N3281 Overactive bladder: Secondary | ICD-10-CM | POA: Diagnosis not present

## 2022-11-01 DIAGNOSIS — I129 Hypertensive chronic kidney disease with stage 1 through stage 4 chronic kidney disease, or unspecified chronic kidney disease: Secondary | ICD-10-CM | POA: Diagnosis not present

## 2022-11-01 DIAGNOSIS — E1122 Type 2 diabetes mellitus with diabetic chronic kidney disease: Secondary | ICD-10-CM | POA: Diagnosis not present

## 2022-11-01 DIAGNOSIS — K219 Gastro-esophageal reflux disease without esophagitis: Secondary | ICD-10-CM | POA: Diagnosis not present

## 2022-11-02 LAB — LAB REPORT - SCANNED
Albumin, Urine POC: 370.9
Creatinine, POC: 47.5 mg/dL
EGFR: 33
Microalb Creat Ratio: 781

## 2022-11-05 ENCOUNTER — Other Ambulatory Visit: Payer: Self-pay | Admitting: Family Medicine

## 2022-11-05 NOTE — Telephone Encounter (Signed)
Last filled 08-25-22 #60 Last OV 10-23-22 No Future OV [facility] [address]

## 2022-11-11 ENCOUNTER — Telehealth: Payer: Self-pay

## 2022-11-11 ENCOUNTER — Other Ambulatory Visit (HOSPITAL_COMMUNITY): Payer: Self-pay

## 2022-11-11 ENCOUNTER — Encounter: Payer: Self-pay | Admitting: Internal Medicine

## 2022-11-11 NOTE — Telephone Encounter (Addendum)
Pt has been approved for pt assistance for Farxiga through [employer] until 06/03/2023. Medication will ship to PCP's office, as they will not deliver to the PO box listed as pt's temporary address.

## 2022-11-11 NOTE — Telephone Encounter (Signed)
I had enrolled patient in [employer] for Morgan Hubbard last year, so she's been using that to pick up [place] at the pharmacy for $0.  Morgan Hubbard - [employer]well grant (Cardiomyopathy) 12/25/21 - 12/25/22 ID: [id number] BIN: [id number] PCN: [id number] GROUP: [id number]

## 2022-11-12 NOTE — Progress Notes (Unsigned)
    Morgan Fomby T. Callaghan Laverdure, MD, CAQ Sports Medicine [facility] at [facility] [address] [place], [address]  Phone: [phone]  FAX: [phone]  Morgan Hubbard - 88 y.o. female  MRN [id number]  Date of Birth: September 01, 1934  Date: 11/13/2022  PCP: Judy Pimple, MD  Referral: Judy Pimple, MD  No chief complaint on file.  Subjective:   Morgan Hubbard is a 88 y.o. very pleasant female patient with There is no height or weight on file to calculate BMI. who presents with the following:  Patient presents with ongoing shoulder pain.  I have known her for many years, however I have not seen her in more than a year.  She has had some intermittent problems mostly with the right shoulder and to a lesser extent the left after a fall.    Review of Systems is noted in the HPI, as appropriate  Objective:   LMP 06/03/1992   GEN: No acute distress; alert,appropriate. PULM: Breathing comfortably in no respiratory distress PSYCH: Normally interactive.   Laboratory and Imaging Data:  Assessment and Plan:   ***

## 2022-11-13 ENCOUNTER — Ambulatory Visit (INDEPENDENT_AMBULATORY_CARE_PROVIDER_SITE_OTHER): Payer: Medicare HMO | Admitting: Family Medicine

## 2022-11-13 ENCOUNTER — Encounter: Payer: Self-pay | Admitting: Family Medicine

## 2022-11-13 VITALS — BP 120/60 | HR 80 | Temp 98.3°F | Ht 61.0 in | Wt 150.1 lb

## 2022-11-13 DIAGNOSIS — M19011 Primary osteoarthritis, right shoulder: Secondary | ICD-10-CM

## 2022-11-13 DIAGNOSIS — R1902 Left upper quadrant abdominal swelling, mass and lump: Secondary | ICD-10-CM

## 2022-11-13 MED ORDER — TRIAMCINOLONE ACETONIDE 40 MG/ML IJ SUSP
40.0000 mg | Freq: Once | INTRAMUSCULAR | Status: AC
Start: 2022-11-13 — End: 2022-11-13
  Administered 2022-11-13: 40 mg via INTRA_ARTICULAR

## 2022-11-15 ENCOUNTER — Encounter: Payer: Self-pay | Admitting: *Deleted

## 2022-11-20 DIAGNOSIS — Z95810 Presence of automatic (implantable) cardiac defibrillator: Secondary | ICD-10-CM | POA: Diagnosis not present

## 2022-11-20 DIAGNOSIS — I428 Other cardiomyopathies: Secondary | ICD-10-CM | POA: Diagnosis not present

## 2022-11-20 DIAGNOSIS — I10 Essential (primary) hypertension: Secondary | ICD-10-CM | POA: Diagnosis not present

## 2022-11-20 DIAGNOSIS — Z4502 Encounter for adjustment and management of automatic implantable cardiac defibrillator: Secondary | ICD-10-CM | POA: Diagnosis not present

## 2022-11-22 ENCOUNTER — Ambulatory Visit
Admission: RE | Admit: 2022-11-22 | Discharge: 2022-11-22 | Disposition: A | Discharge status: Home / Self Care | Payer: Medicare HMO | Source: Ambulatory Visit | Attending: Family Medicine | Admitting: Family Medicine

## 2022-11-22 ENCOUNTER — Other Ambulatory Visit: Payer: Self-pay | Admitting: Family Medicine

## 2022-11-22 ENCOUNTER — Telehealth: Payer: Self-pay

## 2022-11-22 DIAGNOSIS — R1902 Left upper quadrant abdominal swelling, mass and lump: Secondary | ICD-10-CM | POA: Diagnosis not present

## 2022-11-22 DIAGNOSIS — R1012 Left upper quadrant pain: Secondary | ICD-10-CM | POA: Diagnosis not present

## 2022-11-22 DIAGNOSIS — M19011 Primary osteoarthritis, right shoulder: Secondary | ICD-10-CM

## 2022-11-22 NOTE — Telephone Encounter (Signed)
Received call from [facility]. States patient wanted to verify ok to have contrast. Patient is concerned that it may cause issues with her only having one kidney. Reviewed all information with Dr. Sharen Hones recommend due to reason and type of CT ordered that patient move forward with CT using minimal contrast.  I have spoke to patient and imaging center and answered all patient questions. She will have CT today. Imaging center aware of recommendation of reduced contrast will call if any further questions.

## 2022-11-22 NOTE — Telephone Encounter (Signed)
Noted. Will forward to PCP as fyi.

## 2022-11-22 NOTE — Telephone Encounter (Signed)
I called the imaging center and advised them to change ot no contrast in light of only one kidney  They agreed and I thanked them for the heads up  Radiologist wants to proceed w/o contrast   Thanks Dr Reece Agar!

## 2022-11-24 NOTE — Telephone Encounter (Signed)
Thanks for ordering this , I changed to no contrast in light of borderline cr and one kidney  I am aware the test will not be as good  Thanks for seeing her

## 2022-11-25 ENCOUNTER — Other Ambulatory Visit: Payer: Self-pay | Admitting: Family Medicine

## 2022-11-25 ENCOUNTER — Telehealth: Payer: Self-pay | Admitting: Family Medicine

## 2022-11-25 NOTE — Telephone Encounter (Signed)
Pt called asking for the results of her scan that was ordered by Dr. Xan Lager? Call back # [phone]

## 2022-11-26 NOTE — Telephone Encounter (Signed)
RF request for Farxiga LOV: 08/05/22, acute Next ov: N/A Last written: historical provider  Please advise. Med pending

## 2022-11-26 NOTE — Telephone Encounter (Signed)
Left message for Morgan Hubbard letting her know we are still waiting on radiology to read her scan.  I let her know on average it is taking about a week to get the final report and we will notified as soon as we have her results.

## 2022-11-26 NOTE — Telephone Encounter (Signed)
This needs to go to her endocrinologist.

## 2022-11-27 ENCOUNTER — Encounter: Payer: Self-pay | Admitting: Family Medicine

## 2022-11-27 NOTE — Telephone Encounter (Signed)
Pt is requesting results of CT Abd

## 2022-11-29 DIAGNOSIS — I428 Other cardiomyopathies: Secondary | ICD-10-CM | POA: Diagnosis not present

## 2022-11-29 DIAGNOSIS — R9431 Abnormal electrocardiogram [ECG] [EKG]: Secondary | ICD-10-CM | POA: Diagnosis not present

## 2022-11-29 DIAGNOSIS — Z7901 Long term (current) use of anticoagulants: Secondary | ICD-10-CM | POA: Diagnosis not present

## 2022-11-29 DIAGNOSIS — Z95810 Presence of automatic (implantable) cardiac defibrillator: Secondary | ICD-10-CM | POA: Diagnosis not present

## 2022-11-29 DIAGNOSIS — Z8673 Personal history of transient ischemic attack (TIA), and cerebral infarction without residual deficits: Secondary | ICD-10-CM | POA: Diagnosis not present

## 2022-11-29 DIAGNOSIS — Z4502 Encounter for adjustment and management of automatic implantable cardiac defibrillator: Secondary | ICD-10-CM | POA: Diagnosis not present

## 2022-11-29 DIAGNOSIS — I447 Left bundle-branch block, unspecified: Secondary | ICD-10-CM | POA: Diagnosis not present

## 2022-11-29 DIAGNOSIS — I10 Essential (primary) hypertension: Secondary | ICD-10-CM | POA: Diagnosis not present

## 2022-12-04 ENCOUNTER — Telehealth: Payer: Self-pay | Admitting: Family Medicine

## 2022-12-04 NOTE — Telephone Encounter (Signed)
Patient contacted the office regarding medication question. Patient states she has met with Mardella Layman several times in the past regarding medication pricing issues. Patient asked if she could set up a time to speak with someone about the pricing of medication eliquis. Please advise, thank you.

## 2022-12-09 ENCOUNTER — Telehealth: Payer: Self-pay

## 2022-12-09 NOTE — Progress Notes (Signed)
   Care Guide Note  12/09/2022 Name: [name] MRN: [id number] DOB: Apr 13, 1935  Referred by: Tower, Audrie Gallus, MD Reason for referral : Care Coordination (Outreach to schedule with Pharm d med cost )   Morgan Hubbard is a 88 y.o. year old female who is a primary care patient of Tower, Audrie Gallus, MD. [name] was referred to the pharmacist for assistance related to Atrial Fibrillation.    An unsuccessful telephone outreach was attempted today to contact the patient who was referred to the pharmacy team for assistance with medication assistance. Additional attempts will be made to contact the patient.   Penne Lash, RMA Care Guide [facility]  [place], [place] [address] Direct Dial: [phone] [email or web address]

## 2022-12-10 ENCOUNTER — Other Ambulatory Visit: Payer: Medicare HMO | Admitting: Pharmacist

## 2022-12-10 ENCOUNTER — Other Ambulatory Visit (HOSPITAL_COMMUNITY): Payer: Self-pay

## 2022-12-10 NOTE — Progress Notes (Unsigned)
12/10/2022 Name: Morgan Hubbard MRN: [id number] DOB: 1935-03-09  Chief Complaint  Patient presents with   Medication Management   Diabetes   Hypertension   Hyperlipidemia    Morgan Hubbard is a 88 y.o. year old female who presented for a telephone visit.   They were referred to the pharmacist by their PCP for assistance in managing medication access.    Subjective:  Care Team: Primary Care Provider: Tower, Audrie Gallus, MD ; Next Scheduled Visit:  not scheduled  Cardiologist: [facility]; Next Scheduled Visit: primary on 01/01/23, EP in 06/2023  Medication Access/Adherence  Current Pharmacy:  [facility] [address], [place] - [address] [place] [address] Phone: [phone] Fax: [phone]   Patient reports affordability concerns with their medications: Yes  Patient reports access/transportation concerns to their pharmacy: No  Patient reports adherence concerns with their medications:  No    Mychart Amb Medication Adherence Questionnaire   12/09/2022  5:13 PM EDT - Filed by Patient  During the past 2 weeks, have you missed any doses of your medication(s)? No  During the past 2 weeks, have you missed any doses of your medications because you could not afford them? No  During the past 2 weeks, have you missed any doses of your medications because you could not get to the pharmacy? No  During the past 2 weeks, have you missed any doses of your medications because you could not get refills? No  During the past 2 weeks, have you missed any doses of your medication because you do not know what the medication is for? No  During the past 2 weeks, have you missed any doses of your medications because your medications make you feel sick? No  During the past 2 weeks, have you missed any doses of your medications because you have too many medications? No  During the past 2 weeks, have you missed any doses of your medications because you forgot to take your medications?  No  During the past 2 weeks, have you missed any doses of your medications because you feel fine and do not think you need the medication(s)? No  Have you missed doses of your medications in the past 2 weeks for any other reason not listed above? No     Diabetes:   Current medications: Farxiga 5 mg daily, Ozempic 2 mg weekly, Tresiba 26 units daily, glimepiride 2 mg daily Medications tried in the past: no metformin due to renal function    Using [employer], reports cost with [facility] is ~$160 for 3 month supply   Medication access: enrolled in Novo Nordisk assistance through 06/03/23; Enrolled in [employer] Cardiomyopathy [place] through 12/2023   Heart Failure:   Current medications:  ACEi/ARB/ARNI: none, renal function SGLT2i: Farxiga 5 mg daily Beta blocker: carvedilol 12.5 mg twice daily Mineralocorticoid Receptor Antagonist: none, renal function Diuretic regimen: none, renal function Additional antihypertensives: amlodipine 2.5 mg daily, hydralazine 50 mg three times daily, isosorbide 60 mg daily   Approved for [employer] - Will call updated information to the pharmacy later this month for copay assistance  ?Atrial Fibrillation (diagnosis not documented in last cardiology visit):  Current medications: Rate Control: carvedilol 12.5 mg twice daily Rhythm Control: none Anticoagulation Regimen: Eliquis 2.5 mg twice daily (dose appropriate for renal function)  Received a free $30 supply coupon to start Eliquis.   Current medication access support: reports Eliquis copay is $300 for 30 day supply  OAB: Current medications: [name] 75 mg daily   Reports cost of this medication is now $358 for 90 day supply   Objective:  Lab Results  Component Value Date   HGBA1C 8.0 (H) 11/29/2021    Lab Results  Component Value Date   CREATININE 1.67 (H) 01/01/2022   BUN 30 (H) 01/01/2022   NA 140 01/01/2022   K 5.1 01/01/2022   CL 104  01/01/2022   CO2 31 01/01/2022    Lab Results  Component Value Date   CHOL 99 06/12/2020   HDL 33.20 (L) 06/12/2020   LDLCALC 46 06/10/2019   LDLDIRECT 46.0 06/12/2020   TRIG 209.0 (H) 06/12/2020   CHOLHDL 3 06/12/2020    Medications Reviewed Today     Reviewed by Alden Hipp, RPH-CPP (Pharmacist) on 12/10/22 at 1207  Med List Status: <None>   Medication Order Taking? Sig Documenting Provider Last Dose Status Informant  albuterol (VENTOLIN HFA) 108 (90 Base) MCG/ACT inhaler [id number]  Inhale 2 puffs into the lungs every 6 (six) hours as needed for wheezing or shortness of breath. Mort Sawyers, FNP  Active   amLODipine (NORVASC) 2.5 MG tablet [id number] Yes Take 2.5 mg by mouth at bedtime. Tyler Pita, MD Taking Active   apixaban Everlene Balls) 2.5 MG TABS tablet [id number] Yes Take 2.5 mg by mouth 2 (two) times daily. [provider] Taking Active     Discontinued 12/10/22 1043 (Completed Course) atorvastatin (LIPITOR) 80 MG tablet [id number] Yes Take 1 tablet (80 mg total) by mouth daily. Antonieta Iba, MD Taking Active   BIOTIN 5000 PO [id number] Yes Take 5,000 mg by mouth daily. [provider] Taking Active   carvedilol (COREG) 12.5 MG tablet [id number] Yes Take 1 tablet (12.5 mg total) by mouth in the morning, at noon, and at bedtime.  Patient taking differently: Take 12.5 mg by mouth 2 (two) times daily with a meal.   Gollan, Tollie Pizza, MD Taking Active   cetirizine (ZYRTEC) 10 MG tablet [id number] Yes Take 10 mg by mouth daily. [provider] Taking Active Multiple Informants  clotrimazole-betamethasone (LOTRISONE) cream [id number]  Apply 1 application topically as needed (groin).  [provider]  Active Multiple Informants           Med Note Revonda Standard, AMBER B   Wed Jun 10, 2018  5:40 PM)    Continuous Glucose Sensor (FREESTYLE LIBRE 3 SENSOR) [place] [id number]  USE ONE SENSOR EVERY 14 DAYS [provider]  Active    dapagliflozin propanediol (FARXIGA) 5 MG TABS tablet [id number] Yes Take 5 mg by mouth daily. [provider] Taking Active   famotidine (PEPCID) 20 MG tablet [id number] Yes Take 1 tablet by mouth twice daily Tower, Audrie Gallus, MD Taking Active            Med Note Clearance Coots, Albertina Leise T   Tue Dec 10, 2022 10:47 AM) Taking once daily  gabapentin (NEURONTIN) 300 MG capsule [id number] Yes Take 1 capsule (300 mg total) by mouth 3 (three) times daily. Tower, Audrie Gallus, MD Taking Active   glimepiride (AMARYL) 2 MG tablet [id number] Yes Take 2 mg by mouth daily with breakfast. [provider] Taking Active   hydrALAZINE (APRESOLINE) 50 MG tablet [id number] Yes Take 1 tablet (50 mg total) by mouth 3 (three) times daily. Antonieta Iba, MD Taking Active   insulin degludec (TRESIBA) 100 UNIT/ML FlexTouch Pen [id number] Yes Inject 26 Units into the skin at bedtime. [provider] Taking Active  Med Note Kathyrn Sheriff   Tue Nov 05, 2022  4:30 PM) Novo Cares PAP - Dr Tedd Sias  isosorbide mononitrate (IMDUR) 60 MG 24 hr tablet [id number] Yes Take 1 tablet (60 mg total) by mouth in the morning and at bedtime. Antonieta Iba, MD Taking Active   letrozole [facility]) 2.5 MG tablet [id number] Yes Take 2.5 mg by mouth daily. [provider] Taking Active Multiple Informants  nystatin cream (MYCOSTATIN) [id number]  Apply 1 application topically 2 (two) times daily as needed (irritation.).  [provider]  Active Multiple Informants  pantoprazole (PROTONIX) 40 MG tablet [id number]  Take 1 tablet by mouth once daily Tower, Audrie Gallus, MD  Active Multiple Informants  Respiratory Therapy Supplies (NEBULIZER/TUBING/MOUTHPIECE) KIT [id number]  1 each by Does not apply route 3 (three) times daily as needed. Mort Sawyers, FNP  Active   Semaglutide, 2 MG/DOSE, (OZEMPIC, 2 MG/DOSE,) 8 MG/3ML SOPN [id number] Yes Inject 2 mg into the skin once a week. Raj Janus, MD Taking Active             Med Note Kathyrn Sheriff   Tue Nov 05, 2022  4:37 PM) Novo Cares PAP - Dr Tedd Sias  traMADol Janean Sark) 50 MG tablet [id number] Yes TAKE 1 TO 2 TABLETS BY MOUTH EVERY 8 HOURS AS NEEDED FOR MODERATE TO SEVERE PAIN Tower, Audrie Gallus, MD Taking Active   [name] [facility]) 75 MG TABS [id number] Yes Take 1 tablet by mouth once daily Tower, Audrie Gallus, MD Taking Active   vitamin B-12 (CYANOCOBALAMIN) 1000 MCG tablet [id number] Yes Take 1,000 mcg by mouth daily.  [provider] Taking Active Multiple Informants  Vitamin D, Ergocalciferol, (DRISDOL) 1.25 MG (50000 UT) CAPS capsule [id number] Yes Take 50,000 Units by mouth every 7 (seven) days. [provider] Taking Active Multiple Informants           Med Note Para March, Dwana Curd   Fri Dec 07, 2021 12:51 PM)                Assessment/Plan:    Diabetes: - Uncontrolled - Reviewed that she receives Ozempic, [place] from [employer] patient assistance to Dr. Pricilla Handler office. Continue to collaborate with Dr. Tedd Sias on management.  - Completed [employer]Well Cardiomyopathy Fund re-enrollment for [employer]. Will call updated information to the pharmacy later this month.  - Appears copay on prescription benefits may be cheaper for [place] 2 sensors. Contacted [facility], asked that they run a prescription for Libre  Heart Failure: -Appropriately managed - Completed [employer]Well Cardiomyopathy Fund re-enrollment for [place], carvedilol. Will call updated information to the pharmacy later this month.   ?Atrial Fibrillation (diagnosis not documented in last cardiology visit): - Discussed household income. Patient is over income for DOAC assistance. Discussed Xarelto Leane Para savings program (direct cost of $89/30 day or $250/90 day). Patient would be amenable to change if appropriate per cardiology. Eliquis would likely be preferred given renal function, but Xarelto preferred to warfarin if patient unable to afford to pay cost of Eliquis.  - Contacted [facility], left voicemail asking for a return call regarding switch to Xarelto.    OAB: - Appropriately managed - No cost support options exist for [name] at this time. A Tier exception was previously completed, but patient is in the Coverage Gap.  - Recommend to continue current regimen at this time.   Follow Up Plan: will follow up when I hear back from cardiology  Catie Eppie Gibson, PharmD, BCACP, CPP Clinical Pharmacist [facility] [phone]

## 2022-12-11 MED ORDER — FREESTYLE LIBRE 3 SENSOR MISC: 3 refills | Status: DC

## 2022-12-11 NOTE — Patient Instructions (Signed)
Elanor,   Continue your current regimen. I will let you know when I hear back from your cardiologist regarding the Xarelto.   Thanks!  Catie Eppie Gibson, PharmD, BCACP, CPP Clinical Pharmacist [facility] [phone]

## 2022-12-16 DIAGNOSIS — R92333 Mammographic heterogeneous density, bilateral breasts: Secondary | ICD-10-CM | POA: Diagnosis not present

## 2022-12-16 DIAGNOSIS — Z1231 Encounter for screening mammogram for malignant neoplasm of breast: Secondary | ICD-10-CM | POA: Diagnosis not present

## 2022-12-16 LAB — HM MAMMOGRAPHY

## 2022-12-19 ENCOUNTER — Other Ambulatory Visit: Payer: Self-pay | Admitting: Pharmacist

## 2022-12-19 DIAGNOSIS — E1165 Type 2 diabetes mellitus with hyperglycemia: Secondary | ICD-10-CM | POA: Diagnosis not present

## 2022-12-19 LAB — HEMOGLOBIN A1C: Hemoglobin A1C: 7.5

## 2022-12-19 NOTE — Progress Notes (Signed)
Care Coordination Call  Received message from patient that Morgan Hubbard is not using [name] information for [name] fill and they are telling her that information is once per lifetime use. That is wrong.   Contacted [facility]. Discussed how to reprocess prescription. Notified patient in a voicemail. I also have not heard back from [facility] on transition to Xarelto, but I can see my phone note in Care Everywhere on 7/9 and I do not see a response. Notified patient to contact [facility] to follow up.  Catie Eppie Gibson, PharmD, BCACP, CPP Clinical Pharmacist [facility] [phone]

## 2022-12-20 ENCOUNTER — Other Ambulatory Visit: Payer: Self-pay | Admitting: Cardiovascular Disease

## 2022-12-20 NOTE — Telephone Encounter (Signed)
Please schedule overdue office visit for 90 day refills. Thank you!

## 2022-12-23 NOTE — Telephone Encounter (Signed)
Pt scheduled on 9/17 wants gollan only

## 2022-12-23 NOTE — Progress Notes (Signed)
Care Coordination Call  Received call from patient. She notes Dr. Chase Caller office did send order for Xarelto, and she started the Xarelto with Me application but was confused about her indication. Called back, left voicemail for her to return my call at her convenience.   Catie Eppie Gibson, PharmD, BCACP, CPP Clinical Pharmacist [facility] [phone]

## 2022-12-24 ENCOUNTER — Telehealth: Payer: Self-pay

## 2022-12-24 ENCOUNTER — Other Ambulatory Visit: Payer: Self-pay | Admitting: Family Medicine

## 2022-12-24 ENCOUNTER — Other Ambulatory Visit: Payer: Self-pay | Admitting: Cardiovascular Disease

## 2022-12-24 MED ORDER — TRAMADOL HCL 50 MG PO TABS: 50.0000 mg | ORAL_TABLET | Freq: Four times a day (QID) | ORAL | 0 refills | Status: DC | PRN

## 2022-12-24 NOTE — Addendum Note (Signed)
Addended by: Nilda Simmer T on: 12/24/2022 03:18 PM   Modules accepted: Orders

## 2022-12-24 NOTE — Telephone Encounter (Signed)
Patient called back to say that she take it twice a day once in morning and at night. Please advise

## 2022-12-24 NOTE — Progress Notes (Signed)
Care Coordination Call  Spoke with patient. Walked through enrolling in Xarelto With Me program through [employer]. They will charge $89 per 30 day supply of $240 per 90 day supply, which is cheaper than her insurance copay for Eliquis.   Will contact Dr. Chase Caller office to notify that prescription form will be coming from Xarelto with Me program.   Catie Eppie Gibson, PharmD, BCACP, CPP Clinical Pharmacist [facility] [phone]

## 2022-12-24 NOTE — Telephone Encounter (Signed)
Message left for patient to return call for clarification on the Carvedilol dosing.    We have documented Carvedilol 12.5 mg TID from last visit on 12/25/21.  It is documented in [facility] EP note on 12/26/21 --Recommended she make changes set in motion by outside cardiologist in stepwise manner: -- Continue imdur 60mg  BID as already taking -- When pillbox needs changing on 7/28, increase Coreg to 50mg  in AM and 25mg  in PM (as opposed to [facility] cardiologist's recommended 25mg  TID)   [facility] med list has carvediloL (COREG) 25 MG tablet Take 1 tablet (25 mg total) by mouth 2 (two) times daily with meals.

## 2022-12-24 NOTE — Telephone Encounter (Signed)
Last documented dosing of carvedilol is different than prescribed.  Left Voicemail and set MyChart message for confirmation on how she is taking.

## 2022-12-24 NOTE — Telephone Encounter (Signed)
Left message for patient to call back and confirm strength.  Is she taking Carvedilol 25 mg bid or 12.5 mg big.

## 2022-12-24 NOTE — Telephone Encounter (Signed)
Name of Medication: Tramadol Name of Pharmacy: [facility] [address] Last Fill or Written Date and Quantity: 11/05/22 #60 tab/ 0 refills  Last Office Visit and Type: pain with Dr. Lemya Lager on 11/13/22 Next Office Visit and Type: none scheduled

## 2022-12-25 ENCOUNTER — Encounter: Payer: Medicare HMO | Admitting: Pharmacist

## 2022-12-25 NOTE — Telephone Encounter (Signed)
Patient confirmed via MyChart that she is taking Carvedilol 12.5 twice a day bid.

## 2022-12-26 DIAGNOSIS — N1832 Chronic kidney disease, stage 3b: Secondary | ICD-10-CM | POA: Diagnosis not present

## 2022-12-26 DIAGNOSIS — Z794 Long term (current) use of insulin: Secondary | ICD-10-CM | POA: Diagnosis not present

## 2022-12-26 DIAGNOSIS — R809 Proteinuria, unspecified: Secondary | ICD-10-CM | POA: Diagnosis not present

## 2022-12-26 DIAGNOSIS — I10 Essential (primary) hypertension: Secondary | ICD-10-CM | POA: Diagnosis not present

## 2022-12-26 DIAGNOSIS — E1129 Type 2 diabetes mellitus with other diabetic kidney complication: Secondary | ICD-10-CM | POA: Diagnosis not present

## 2022-12-26 DIAGNOSIS — E1122 Type 2 diabetes mellitus with diabetic chronic kidney disease: Secondary | ICD-10-CM | POA: Diagnosis not present

## 2022-12-26 DIAGNOSIS — E1169 Type 2 diabetes mellitus with other specified complication: Secondary | ICD-10-CM | POA: Diagnosis not present

## 2022-12-26 DIAGNOSIS — E785 Hyperlipidemia, unspecified: Secondary | ICD-10-CM | POA: Diagnosis not present

## 2023-01-01 DIAGNOSIS — I10 Essential (primary) hypertension: Secondary | ICD-10-CM | POA: Diagnosis not present

## 2023-01-01 DIAGNOSIS — I428 Other cardiomyopathies: Secondary | ICD-10-CM | POA: Diagnosis not present

## 2023-01-07 ENCOUNTER — Other Ambulatory Visit: Payer: Self-pay | Admitting: Pharmacist

## 2023-01-07 NOTE — Progress Notes (Signed)
Care Coordination Call  Patient calls noting that [facility] has Xarelto ready for $400, and she is confused. Called patient, left voicemail noting that the Xarelto program will mail the medication to her when they receive the prescription from Dr. Chase Caller office. She will not be getting the discounted price from [place]. She should continue her current prescription for Eliquis 2.5 mg twice daily until she receives the Xarelto supply from the Xarelto With Me program.   Provided the Xarelto company phone number to call and follow up on status.   Catie Eppie Gibson, PharmD, BCACP, CPP Clinical Pharmacist [facility] [phone]

## 2023-01-20 ENCOUNTER — Encounter: Payer: Self-pay | Admitting: Family Medicine

## 2023-01-20 ENCOUNTER — Ambulatory Visit (INDEPENDENT_AMBULATORY_CARE_PROVIDER_SITE_OTHER): Payer: Medicare HMO | Admitting: Family Medicine

## 2023-01-20 VITALS — BP 124/68 | HR 79 | Temp 97.5°F | Ht 60.0 in | Wt 146.4 lb

## 2023-01-20 DIAGNOSIS — H6121 Impacted cerumen, right ear: Secondary | ICD-10-CM

## 2023-01-20 DIAGNOSIS — N318 Other neuromuscular dysfunction of bladder: Secondary | ICD-10-CM

## 2023-01-20 DIAGNOSIS — E1129 Type 2 diabetes mellitus with other diabetic kidney complication: Secondary | ICD-10-CM

## 2023-01-20 DIAGNOSIS — D692 Other nonthrombocytopenic purpura: Secondary | ICD-10-CM

## 2023-01-20 DIAGNOSIS — Z7985 Long-term (current) use of injectable non-insulin antidiabetic drugs: Secondary | ICD-10-CM

## 2023-01-20 MED ORDER — GEMTESA 75 MG PO TABS
1.0000 | ORAL_TABLET | Freq: Every day | ORAL | 1 refills | Status: DC
Start: 2023-01-20 — End: 2023-04-18

## 2023-01-20 NOTE — Assessment & Plan Note (Signed)
Pt has notable superficial bruises of different sizes -both arms  Larger bruise around right elbow from trauma/ no worrisome features and no skin tears noted   Suspect this is from her xarelto  Reviewed last plt count from jan-normal   Discussed being careful to avoid trauma  Skin care and protection encouraged

## 2023-01-20 NOTE — Patient Instructions (Signed)
Take care of yourself   I think the bruising is from the xarelto  If this worsens or you develop a wound that won't stop bleeding please let us know   If the generic ozempic causes you to be UNABLE to eat -then hold the medicine and let your endocrinologist know   You had ear wax on the right  If no further improvement let us know   Take care of yourself

## 2023-01-20 NOTE — Assessment & Plan Note (Signed)
Sees endocrinology On generic ozempic now and this has reduced appetite (pt has concerns but does make herself eat regularly)   Last A1c 7.5

## 2023-01-20 NOTE — Telephone Encounter (Signed)
Will route to pharmacist to see if they can provide any help. Looking at [employer] 90 day supply of med is around $1,400.00

## 2023-01-20 NOTE — Assessment & Plan Note (Signed)
Mild/partial on right Irrigated at pt's request  Improvement noted  Discussed self care of this/ avoid q tips

## 2023-01-20 NOTE — Progress Notes (Signed)
Subjective:    Patient ID: Morgan Hubbard, female    DOB: 12-Oct-1934, 88 y.o.   MRN: [id number]  HPI  Wt Readings from Last 3 Encounters:  01/20/23 146 lb 6 oz (66.4 kg)  11/13/22 150 lb 2 oz (68.1 kg)  10/23/22 151 lb (68.5 kg)   28.59 kg/m  Vitals:   01/20/23 1028  BP: 124/68  Pulse: 79  Temp: (!) 97.5 F (36.4 C)  SpO2: 94%   On ozempic  It is reducing appetite   Pt presents for bruising on her arm  Also ear fullness    Large bruise on her left arm  She remembers bumping it  Skin is very thin  Had a little bleeding - wore a band aid   No pain or swelling   Bruises pop up easily   Feels ok in general    Was on eliquis - changed due to cost  Put on xarelto  History of CVA  Also cardiomyopathy   BP Readings from Last 3 Encounters:  01/20/23 124/68  11/13/22 120/60  10/23/22 136/84   Pulse Readings from Last 3 Encounters:  01/20/23 79  11/13/22 80  10/23/22 73     Right ear feels full  Both ears stay sensitive in general No congestion of sinus pain  Recent lab in care everywhere 12/19/22 Cr 1.6 GF 31 K 5.4  Microalb ratio 697.4 A1c 7.5  Cbc 06/05/2022  hb 11.7 Hct 37.2 Platelet 262  Sees Dr Mariah Milling in October     Lab Results  Component Value Date   WBC 6.4 12/07/2021   HGB 11.0 (L) 12/07/2021   HCT 34.4 (L) 12/07/2021   MCV 95.0 12/07/2021   PLT 179 12/07/2021   Lab Results  Component Value Date   IRON 81 11/05/2021   FERRITIN 27.2 11/05/2021   Lab Results  Component Value Date   INR 0.96 06/10/2018   INR 0.98 01/13/2014   INR 0.97 01/27/2012     Patient Active Problem List   Diagnosis Date Noted   Senile purpura (HCC) 01/20/2023   Stage 3 chronic kidney disease, unspecified whether stage 3a or 3b CKD (HCC) 08/05/2022   Pain due to onychomycosis of toenails of both feet 04/04/2022   History of breast cancer 06/18/2020   Anemia 06/16/2020   Elevated TSH 06/16/2020   Occipital neuralgia 02/14/2020   Hair loss  11/09/2018   Leg weakness, bilateral 06/29/2018   Pedal edema 06/22/2018   AICD (automatic cardioverter/defibrillator) present 06/11/2018   GERD (gastroesophageal reflux disease) 06/10/2018   AKI (acute kidney injury) (HCC) 06/10/2018   Cholecystitis 06/10/2018   Chronic back pain 05/23/2017   Chronic systolic (congestive) heart failure (HCC) 02/25/2017   Osteopenia 06/16/2016   Estrogen deficiency 04/24/2016   Stroke, small vessel (HCC) 01/18/2014   Family history of hemochromatosis 01/18/2014   Cerebral thrombosis with cerebral infarction (HCC) 01/14/2014   Expressive aphasia 01/13/2014   Nonischemic cardiomyopathy (HCC) 04/05/2013   Cerumen impaction 06/16/2012   Elevated transaminase level 05/18/2012   Spinal stenosis of lumbar region 12/10/2011   Mixed incontinence urge and stress 12/10/2011   Renal insufficiency 11/04/2011   Goiter 01/14/2011   Fatty liver 08/28/2010   Type II diabetes mellitus with renal manifestations (HCC) 07/09/2010   Hyperlipidemia associated with type 2 diabetes mellitus (HCC) 07/09/2010   Hyperkalemia 07/09/2010   REFLEX SYMPATHETIC DYSTROPHY 07/09/2010   Neuropathy 07/09/2010   Essential hypertension 07/09/2010   CARDIOMYOPATHY 07/09/2010   OVERACTIVE BLADDER 07/09/2010   Past  Medical History:  Diagnosis Date   Arthritis    "hands" (01/13/2014)   Basal cell carcinoma 01/2014   "bridge of nose"   Cardiac LV ejection fraction >40%    "it was 43 last year" (01/13/2014)   Chronic lower back pain    CKD (chronic kidney disease), stage III (HCC)    acute on chronic stage III/notes 06/10/2018   Colon polyps    Expressive aphasia    "3 times in the last week" (01/13/2014)   GERD (gastroesophageal reflux disease)    Goiter past remote   treated with RI   Hyperlipidemia    Hyperpotassemia    Hypertension    Hypertonicity of bladder    Inflammatory and toxic neuropathy, unspecified    LBBB (left bundle branch block)    Migraine    "stopped many  years ago" (01/13/2014)   Mini stroke 01/2014   Other primary cardiomyopathies    Overactive bladder    Presence of combination internal cardiac defibrillator (ICD) and pacemaker    Reflex sympathetic dystrophy, unspecified    Shingles    Shortness of breath    ambulation   Stroke (HCC)    Thyroid disease    Type II diabetes mellitus (HCC)    Ulcer    Urine incontinence    Vertigo    hx of   Past Surgical History:  Procedure Laterality Date   BACK SURGERY     BI-VENTRICULAR PACEMAKER INSERTION (CRT-P)  10/2014   [facility]   BREAST CYST EXCISION Left 1959   CARDIAC CATHETERIZATION  "several"   [place]   CATARACT EXTRACTION W/ INTRAOCULAR LENS IMPLANT Left ~ 2005   several eye injections   CATARACT EXTRACTION W/PHACO Right 05/04/2020   Procedure: CATARACT EXTRACTION PHACO AND INTRAOCULAR LENS PLACEMENT (IOC) RIGHT DIABETIC;  Surgeon: Galen Manila, MD;  Location: [facility] ORS;  Service: Ophthalmology;  Laterality: Right;  [place] 00:58.5 CDE 6.70 Fluid Pack [place] # [id number]   CHOLECYSTECTOMY N/A 06/13/2018   Procedure: LAPAROSCOPIC CHOLECYSTECTOMY;  Surgeon: Manus Rudd, MD;  Location: [facility] OR;  Service: General;  Laterality: N/A;   COLONOSCOPY  7/12   normal (hx of polyps in past)    CT SCAN  3/12   outside hosp- lung nodule and gallstones   ERCP N/A 06/12/2018   Procedure: ENDOSCOPIC RETROGRADE CHOLANGIOPANCREATOGRAPHY (ERCP);  Surgeon: Jeani Hawking, MD;  Location: [facility] ENDOSCOPY;  Service: Endoscopy;  Laterality: N/A;   FINGER SURGERY Left 1959 & 1976   "knot on index"   KIDNEY DONATION Left 1989   LUMBAR LAMINECTOMY/DECOMPRESSION MICRODISCECTOMY  1999   LUMBAR LAMINECTOMY/DECOMPRESSION MICRODISCECTOMY  01/31/2012   Procedure: LUMBAR LAMINECTOMY/DECOMPRESSION MICRODISCECTOMY 2 LEVELS;  Surgeon: Tia Alert, MD;  Location: [facility] NEURO ORS;  Service: Neurosurgery;  Laterality: N/A;  Thoracic twelve-lumbar one, lumbar one-two laminectomy    POSTERIOR LAMINECTOMY / DECOMPRESSION CERVICAL  SPINE  1999   REMOVAL OF STONES  06/12/2018   Procedure: REMOVAL OF STONES;  Surgeon: Jeani Hawking, MD;  Location: [facility] ENDOSCOPY;  Service: Endoscopy;;   SPHINCTEROTOMY  06/12/2018   Procedure: Dennison Mascot;  Surgeon: Jeani Hawking, MD;  Location: [facility] ENDOSCOPY;  Service: Endoscopy;;   TUBAL LIGATION  1976   UPPER GASTROINTESTINAL ENDOSCOPY     Social History   Tobacco Use   Smoking status: Never   Smokeless tobacco: Never  Vaping Use   Vaping status: Never Used  Substance Use Topics   Alcohol use: No    Alcohol/week: 0.0 standard drinks of alcohol   Drug use: No  Family History  Problem Relation Age of Onset   Arthritis Mother    Cancer Mother        uterine and mouth   Hyperlipidemia Mother    Stroke Mother    Hypertension Mother    Alcohol abuse Father    Diabetes Father    Cancer Sister        breast   Diabetes Sister    Hyperlipidemia Sister    Heart disease Sister    Hypertension Sister    Diabetes Sister    Cancer Brother        lung cancer   Kidney disease Brother    Diabetes Brother    Hyperlipidemia Brother    Kidney failure Brother    Diabetes Daughter    Heart attack Daughter    Addison's disease Other    Allergies  Allergen Reactions   Ace Inhibitors Swelling    REACTION: tongue swelling   Lisinopril Swelling    Swelling of tongue   Insulin Detemir Itching   Nsaids Other (See Comments)    Renal issues   Codeine Nausea Only   Hydrochlorothiazide Other (See Comments)    Dizziness/funny feeling   Tylenol [Acetaminophen] Other (See Comments)    Fatty liver - use with caution   Current Outpatient Medications on File Prior to Visit  Medication Sig Dispense Refill   albuterol (VENTOLIN HFA) 108 (90 Base) MCG/ACT inhaler Inhale 2 puffs into the lungs every 6 (six) hours as needed for wheezing or shortness of breath. 8 g 0   amLODipine (NORVASC) 2.5 MG tablet Take 2.5 mg by mouth at bedtime.     atorvastatin (LIPITOR) 80 MG tablet Take 1 tablet by  mouth once daily 90 tablet 0   BIOTIN 5000 PO Take 5,000 mg by mouth daily.     carvedilol (COREG) 12.5 MG tablet Take 1 tablet (12.5 mg total) by mouth 2 (two) times daily with a meal. 180 tablet 0   cetirizine (ZYRTEC) 10 MG tablet Take 10 mg by mouth daily.     clotrimazole-betamethasone (LOTRISONE) cream Apply 1 application topically as needed (groin).      Continuous Glucose Sensor (FREESTYLE LIBRE 3 SENSOR) MISC Use to check glucose continuously 6 each 3   dapagliflozin propanediol (FARXIGA) 5 MG TABS tablet Take 5 mg by mouth daily.     famotidine (PEPCID) 20 MG tablet Take 1 tablet by mouth twice daily 180 tablet 1   gabapentin (NEURONTIN) 300 MG capsule Take 1 capsule (300 mg total) by mouth 3 (three) times daily. 270 capsule 3   glimepiride (AMARYL) 2 MG tablet Take 2 mg by mouth daily with breakfast.     hydrALAZINE (APRESOLINE) 50 MG tablet Take 1 tablet (50 mg total) by mouth 3 (three) times daily. 270 tablet 3   insulin degludec (TRESIBA) 100 UNIT/ML FlexTouch Pen Inject 26 Units into the skin at bedtime.     isosorbide mononitrate (IMDUR) 60 MG 24 hr tablet Take 1 tablet (60 mg total) by mouth in the morning and at bedtime. 180 tablet 3   letrozole (FEMARA) 2.5 MG tablet Take 2.5 mg by mouth daily.     nystatin cream (MYCOSTATIN) Apply 1 application topically 2 (two) times daily as needed (irritation.).      pantoprazole (PROTONIX) 40 MG tablet Take 1 tablet by mouth once daily 90 tablet 1   Respiratory Therapy Supplies (NEBULIZER/TUBING/MOUTHPIECE) KIT 1 each by Does not apply route 3 (three) times daily as needed. 1 kit 0  Semaglutide, 2 MG/DOSE, (OZEMPIC, 2 MG/DOSE,) 8 MG/3ML SOPN Inject 2 mg into the skin once a week.     traMADol (ULTRAM) 50 MG tablet Take 1-2 tablets (50-100 mg total) by mouth every 6 (six) hours as needed for severe pain. Caution of sedation 60 tablet 0   [name] ([name]) 75 MG TABS Take 1 tablet by mouth once daily 90 tablet 2   vitamin B-12  (CYANOCOBALAMIN) 1000 MCG tablet Take 1,000 mcg by mouth daily.      Vitamin D, Ergocalciferol, (DRISDOL) 1.25 MG (50000 UT) CAPS capsule Take 50,000 Units by mouth every 7 (seven) days.     Rivaroxaban (XARELTO) 15 MG TABS tablet Take 15 mg by mouth daily with supper.     No current facility-administered medications on file prior to visit.    Review of Systems  Constitutional:  Negative for activity change, appetite change, fatigue, fever and unexpected weight change.  HENT:  Positive for ear pain and hearing loss. Negative for congestion, ear discharge, rhinorrhea, sinus pressure and sore throat.   Eyes:  Negative for pain, redness and visual disturbance.  Respiratory:  Negative for cough, shortness of breath and wheezing.   Cardiovascular:  Negative for chest pain and palpitations.  Gastrointestinal:  Negative for abdominal pain, blood in stool, constipation and diarrhea.  Endocrine: Negative for polydipsia and polyuria.  Genitourinary:  Negative for dysuria, frequency and urgency.  Musculoskeletal:  Negative for arthralgias, back pain and myalgias.  Skin:  Negative for pallor and rash.  Allergic/Immunologic: Negative for environmental allergies.  Neurological:  Negative for dizziness, syncope and headaches.  Hematological:  Negative for adenopathy. Bruises/bleeds easily.  Psychiatric/Behavioral:  Negative for decreased concentration and dysphoric mood. The patient is not nervous/anxious.        Lost daughter this year Grief         Objective:   Physical Exam Constitutional:      General: She is not in acute distress.    Appearance: Normal appearance. She is normal weight. She is not ill-appearing or diaphoretic.  HENT:     Head: Normocephalic and atraumatic.     Right Ear: Tympanic membrane normal. There is no impacted cerumen.     Left Ear: Tympanic membrane, ear canal and external ear normal. There is no impacted cerumen.     Ears:     Comments: Partial cerumen impaction  of right ear   TMs appear normal   Procedure: Cerumen Disimpaction right   Warm water was applied and gentle ear lavage performed on R  ear.    There were no complications and following the disimpaction the tympanic membrane were visible on the bilateral. Tympanic membranes are intact following the procedure.  Auditory canals are normal.  The patient reported relief of symptoms after removal of cerumen.  S/p irrigation only small amount of flaky cerumen at entrance     Nose: Nose normal.     Mouth/Throat:     Mouth: Mucous membranes are moist.  Eyes:     General:        Right eye: No discharge.        Left eye: No discharge.     Conjunctiva/sclera: Conjunctivae normal.     Pupils: Pupils are equal, round, and reactive to light.  Cardiovascular:     Rate and Rhythm: Normal rate and regular rhythm.  Pulmonary:     Effort: No respiratory distress.     Breath sounds: No wheezing or rales.  Abdominal:     General:  There is no distension.  Musculoskeletal:     Right lower leg: No edema.     Left lower leg: No edema.  Skin:    General: Skin is warm and dry.     Findings: Bruising present. No erythema or rash.     Comments: Large superficial ecchymosis on left proximal forearm and lateral to elbow No skin interruption  Purple/ red  Smaller areas on right arm as well     Neurological:     Mental Status: She is alert.           Assessment & Plan:   Problem List Items Addressed This Visit       Cardiovascular and Mediastinum   Senile purpura (HCC)    Pt has notable superficial bruises of different sizes -both arms  Larger bruise around right elbow from trauma/ no worrisome features and no skin tears noted   Suspect this is from her xarelto  Reviewed last plt count from jan-normal   Discussed being careful to avoid trauma  Skin care and protection encouraged         Endocrine   Type II diabetes mellitus with renal manifestations (HCC)    Sees endocrinology On  generic ozempic now and this has reduced appetite (pt has concerns but does make herself eat regularly)   Last A1c 7.5         Nervous and Auditory   Cerumen impaction - Primary    Mild/partial on right Irrigated at pt's request  Improvement noted  Discussed self care of this/ avoid q tips

## 2023-01-24 ENCOUNTER — Encounter: Payer: Self-pay | Admitting: Family Medicine

## 2023-01-26 NOTE — Telephone Encounter (Signed)
Will cc to pharmacist

## 2023-01-28 DIAGNOSIS — C50311 Malignant neoplasm of lower-inner quadrant of right female breast: Secondary | ICD-10-CM | POA: Diagnosis not present

## 2023-01-28 DIAGNOSIS — Z1231 Encounter for screening mammogram for malignant neoplasm of breast: Secondary | ICD-10-CM | POA: Diagnosis not present

## 2023-01-28 DIAGNOSIS — Z170 Estrogen receptor positive status [ER+]: Secondary | ICD-10-CM | POA: Diagnosis not present

## 2023-01-28 DIAGNOSIS — Z853 Personal history of malignant neoplasm of breast: Secondary | ICD-10-CM | POA: Diagnosis not present

## 2023-02-16 ENCOUNTER — Encounter: Payer: Self-pay | Admitting: Family Medicine

## 2023-02-17 ENCOUNTER — Ambulatory Visit (INDEPENDENT_AMBULATORY_CARE_PROVIDER_SITE_OTHER): Payer: Medicare HMO | Admitting: Internal Medicine

## 2023-02-17 ENCOUNTER — Encounter: Payer: Self-pay | Admitting: Internal Medicine

## 2023-02-17 VITALS — BP 130/80 | HR 72 | Temp 97.2°F | Ht 60.0 in | Wt 147.0 lb

## 2023-02-17 DIAGNOSIS — K625 Hemorrhage of anus and rectum: Secondary | ICD-10-CM | POA: Insufficient documentation

## 2023-02-17 DIAGNOSIS — N3000 Acute cystitis without hematuria: Secondary | ICD-10-CM | POA: Diagnosis not present

## 2023-02-17 DIAGNOSIS — R300 Dysuria: Secondary | ICD-10-CM | POA: Diagnosis not present

## 2023-02-17 LAB — POC URINALSYSI DIPSTICK (AUTOMATED)
Bilirubin, UA: NEGATIVE
Glucose, UA: POSITIVE — AB
Nitrite, UA: POSITIVE
Protein, UA: POSITIVE — AB
Spec Grav, UA: 1.015 (ref 1.010–1.025)
Urobilinogen, UA: 0.2 U/dL
pH, UA: 6 (ref 5.0–8.0)

## 2023-02-17 MED ORDER — HYDROCORTISONE 2.5 % EX CREA: TOPICAL_CREAM | Freq: Three times a day (TID) | CUTANEOUS | 3 refills | Status: AC | PRN

## 2023-02-17 MED ORDER — CEPHALEXIN 500 MG PO CAPS: 500.0000 mg | ORAL_CAPSULE | Freq: Four times a day (QID) | ORAL | 0 refills | Status: DC

## 2023-02-17 NOTE — Assessment & Plan Note (Signed)
Mild symptoms but recurred from May Klebsiella grew then Will treat again with cephalexion 500tid but extend to 7 days  Send culture

## 2023-02-17 NOTE — Progress Notes (Unsigned)
Cardiology Office Note  Date:  02/18/2023   ID:  Morgan Hubbard, DOB July 05, 1934, MRN [id number]  PCP:  Judy Pimple, MD   Chief Complaint  Patient presents with   6 month follow up     Patient c/o shortness of breath with over exertion. Medications reviewed by the patient verbally.     HPI:  Morgan Hubbard is a very pleasant 88 year old woman with past medical history of  nonischemic cardiomyopathy,   Biventricular ICD placed May 2016  Cath 2012 with no CAD ejection fraction of 30% in 2009  left bundle branch block,  TIA/stroke August 2015.  had difficulty speaking,  Walking nephrectomy/donated kidney in 1989,  possible angioedema on lisinopril,  back surgery x3,  minimal carotid b/l in 2015, carotid bruit on the right poorly controlled diabetes   Ejection fraction 45 to 50% in June 2023 up from 40 to 45% in January 2020 She presents for routine followup  Of her cardiomyopathy  Last seen in clinic by myself July 2023 Followed by EP at [facility], last seen January 01, 2023  Told she has afib on her meeting with the EP earlier in the year,  no clear documentation available Started on eliquis 2.5 twice daily, changed to xarelto 15 mg daily Was told to stop aspirin Reports she is still taking asa Significant ecchymotic bruising on her arms, hemorrhoid bleeding  Some SOB on exertion, "Little bit" Uses a walker,  Fall risk with cane No regular walking program, legs weak, balance poor  Not on lasix, "overactive bladder"  Seen by primary care yesterday UTI, started on ABX, cultures pending Rectal bleeding discussed, was told she had a hemorrhoid, given steroid cream  Lab work reviewed Labs: CR 1.6  EKG personally reviewed by myself on todays visit EKG Interpretation Date/Time:  Tuesday February 18 2023 09:54:43 EDT Ventricular Rate:  71 PR Interval:  148 QRS Duration:  126 QT Interval:  448 QTC Calculation: 486 R Axis:   137  Text Interpretation: Atrial-sensed  ventricular-paced rhythm When compared with ECG of 29-Nov-2021 12:38, PREVIOUS ECG IS PRESENT Confirmed by Julien Nordmann [phone]) on 02/18/2023 10:23:28 AM   Other past medical history reviewed History of acute on chronic renal failure and for poorly controlled hypertension Admitted to the hospital at [facility] November 19, 2021 shortness of breath, chest pain, hypertension Poorly controlled hypertension, systolic pressures greater than 200 Discharged on Imdur 60 hydralazine 50 twice daily losartan HCT 100/12.5 daily Coreg 12.5 twice daily, Lasix 10 daily On second trip to the hospital losartan HCTZ was held secondary to renal dysfunction  Admission to [facility]: Metformin on hold, off ARB  Sleeps late in the am, reports that she wakes up at 11 and typically goes to bed between 11 PM and 1 AM  Currently taking carvedilol 12.5 twice daily, hydralazine 50 twice daily, Imdur 60 twice daily  Sedentary, no regular exercise , chronic pain, leg weakness  Ejection fraction of 15-20% in 2015,  Up to 35% in August 2016,  Up to 40% in 2017 Up to 55% in 06/2017 40 to 45% in 06/2018 45 to 50% in June 2023  MRI results reviewed and discussed in detail MRI brain/MRA head w/o contrast 03/08/19: 1. No acute intracranial process. Left frontal lobe encephalomalacia, likely due to prior ischemia.  2. Unchanged high grade narrowing of the left MCA distal M1 segment.  Lab Results  Component Value Date   CHOL 99 06/12/2020   HDL 33.20 (L) 06/12/2020   LDLCALC 46 06/10/2019  TRIG 209.0 (H) 06/12/2020     Other past medical history reviewed TIA/stroke August 2015.  had difficulty speaking,  Walking  Initially placed on aspirin, Plavix, been changed by neurology to aspirin 325 mg daily , Plavix held  She continues to follow-up at [facility]   with cardiology and EP  Biventricular ICD placed May 2016 for shortness of breath symptoms placed by  Dr. Dolores Frame     History ofsevere back disease she has significant  neuropathy in her legs.    Echocardiogram was done for her fatigue and shortness of breath.  ejection fraction of 25-30% which was significant drop from prior ejection fraction February 2013 (45%) done at [facility].  normal RVSP.   Prior cardiac catheterization 08/30/2010 in [place]  showed no significant coronary artery disease  PMH:   has a past medical history of Arthritis, Basal cell carcinoma (01/2014), Cardiac LV ejection fraction >40%, Chronic lower back pain, CKD (chronic kidney disease), stage III (HCC), Colon polyps, Expressive aphasia, GERD (gastroesophageal reflux disease), Goiter (past remote), Hyperlipidemia, Hyperpotassemia, Hypertension, Hypertonicity of bladder, Inflammatory and toxic neuropathy, unspecified, LBBB (left bundle branch block), Migraine, Mini stroke (01/2014), Other primary cardiomyopathies, Overactive bladder, Presence of combination internal cardiac defibrillator (ICD) and pacemaker, Reflex sympathetic dystrophy, unspecified, Shingles, Shortness of breath, Stroke (HCC), Thyroid disease, Type II diabetes mellitus (HCC), Ulcer, Urine incontinence, and Vertigo.  PSH:    Past Surgical History:  Procedure Laterality Date   BACK SURGERY     BI-VENTRICULAR PACEMAKER INSERTION (CRT-P)  10/2014   [facility]   BREAST CYST EXCISION Left 1959   CARDIAC CATHETERIZATION  "several"   [place]   CATARACT EXTRACTION W/ INTRAOCULAR LENS IMPLANT Left ~ 2005   several eye injections   CATARACT EXTRACTION W/PHACO Right 05/04/2020   Procedure: CATARACT EXTRACTION PHACO AND INTRAOCULAR LENS PLACEMENT (IOC) RIGHT DIABETIC;  Surgeon: Galen Manila, MD;  Location: [facility] ORS;  Service: Ophthalmology;  Laterality: Right;  [place] 00:58.5 CDE 6.70 Fluid Pack Lote # [id number] H   CHOLECYSTECTOMY N/A 06/13/2018   Procedure: LAPAROSCOPIC CHOLECYSTECTOMY;  Surgeon: Manus Rudd, MD;  Location: [facility] OR;  Service: General;  Laterality: N/A;   COLONOSCOPY  7/12   normal (hx of polyps in past)    CT  SCAN  3/12   outside hosp- lung nodule and gallstones   ERCP N/A 06/12/2018   Procedure: ENDOSCOPIC RETROGRADE CHOLANGIOPANCREATOGRAPHY (ERCP);  Surgeon: Jeani Hawking, MD;  Location: [facility] ENDOSCOPY;  Service: Endoscopy;  Laterality: N/A;   FINGER SURGERY Left 1959 & 1976   "knot on index"   KIDNEY DONATION Left 1989   LUMBAR LAMINECTOMY/DECOMPRESSION MICRODISCECTOMY  1999   LUMBAR LAMINECTOMY/DECOMPRESSION MICRODISCECTOMY  01/31/2012   Procedure: LUMBAR LAMINECTOMY/DECOMPRESSION MICRODISCECTOMY 2 LEVELS;  Surgeon: Tia Alert, MD;  Location: [facility] NEURO ORS;  Service: Neurosurgery;  Laterality: N/A;  Thoracic twelve-lumbar one, lumbar one-two laminectomy    POSTERIOR LAMINECTOMY / DECOMPRESSION CERVICAL SPINE  1999   REMOVAL OF STONES  06/12/2018   Procedure: REMOVAL OF STONES;  Surgeon: Jeani Hawking, MD;  Location: [facility] ENDOSCOPY;  Service: Endoscopy;;   SPHINCTEROTOMY  06/12/2018   Procedure: Dennison Mascot;  Surgeon: Jeani Hawking, MD;  Location: [facility] ENDOSCOPY;  Service: Endoscopy;;   TUBAL LIGATION  1976   UPPER GASTROINTESTINAL ENDOSCOPY      Current Outpatient Medications  Medication Sig Dispense Refill   albuterol (VENTOLIN HFA) 108 (90 Base) MCG/ACT inhaler Inhale 2 puffs into the lungs every 6 (six) hours as needed for wheezing or shortness of breath. 8 g 0  amLODipine (NORVASC) 2.5 MG tablet Take 2.5 mg by mouth at bedtime.     atorvastatin (LIPITOR) 80 MG tablet Take 1 tablet by mouth once daily 90 tablet 0   BIOTIN 5000 PO Take 5,000 mg by mouth daily.     carvedilol (COREG) 12.5 MG tablet Take 1 tablet (12.5 mg total) by mouth 2 (two) times daily with a meal. 180 tablet 0   cephALEXin (KEFLEX) 500 MG capsule Take 1 capsule (500 mg total) by mouth 4 (four) times daily. 21 capsule 0   cetirizine (ZYRTEC) 10 MG tablet Take 10 mg by mouth daily.     clotrimazole-betamethasone (LOTRISONE) cream Apply 1 application topically as needed (groin).      Continuous Glucose Sensor (FREESTYLE  LIBRE 3 SENSOR) MISC Use to check glucose continuously 6 each 3   dapagliflozin propanediol (FARXIGA) 5 MG TABS tablet Take 5 mg by mouth daily.     famotidine (PEPCID) 20 MG tablet Take 1 tablet by mouth twice daily 180 tablet 1   gabapentin (NEURONTIN) 300 MG capsule Take 1 capsule (300 mg total) by mouth 3 (three) times daily. 270 capsule 3   glimepiride (AMARYL) 2 MG tablet Take 2 mg by mouth daily with breakfast.     hydrALAZINE (APRESOLINE) 50 MG tablet Take 1 tablet (50 mg total) by mouth 3 (three) times daily. 270 tablet 3   hydrocortisone 2.5 % cream Apply topically 3 (three) times daily as needed. 28 g 3   insulin degludec (TRESIBA) 100 UNIT/ML FlexTouch Pen Inject 26 Units into the skin at bedtime.     isosorbide mononitrate (IMDUR) 60 MG 24 hr tablet Take 1 tablet (60 mg total) by mouth in the morning and at bedtime. 180 tablet 3   letrozole (FEMARA) 2.5 MG tablet Take 2.5 mg by mouth daily.     nystatin cream (MYCOSTATIN) Apply 1 application topically 2 (two) times daily as needed (irritation.).      pantoprazole (PROTONIX) 40 MG tablet Take 1 tablet by mouth once daily 90 tablet 1   Respiratory Therapy Supplies (NEBULIZER/TUBING/MOUTHPIECE) KIT 1 each by Does not apply route 3 (three) times daily as needed. 1 kit 0   Rivaroxaban (XARELTO) 15 MG TABS tablet Take 15 mg by mouth daily with supper.     Semaglutide, 2 MG/DOSE, (OZEMPIC, 2 MG/DOSE,) 8 MG/3ML SOPN Inject 2 mg into the skin once a week.     traMADol (ULTRAM) 50 MG tablet Take 1-2 tablets (50-100 mg total) by mouth every 6 (six) hours as needed for severe pain. Caution of sedation 60 tablet 0   [name] ([name]) 75 MG TABS Take 1 tablet (75 mg total) by mouth daily. 90 tablet 1   vitamin B-12 (CYANOCOBALAMIN) 1000 MCG tablet Take 1,000 mcg by mouth daily.      Vitamin D, Ergocalciferol, (DRISDOL) 1.25 MG (50000 UT) CAPS capsule Take 50,000 Units by mouth every 7 (seven) days.     No current facility-administered medications  for this visit.     Allergies:   Ace inhibitors, Lisinopril, Insulin detemir, Nsaids, Codeine, Hydrochlorothiazide, and Tylenol [acetaminophen]   Social History:  The patient  reports that she has never smoked. She has never used smokeless tobacco. She reports that she does not drink alcohol and does not use drugs.   Family History:   family history includes Addison's disease in an other family member; Alcohol abuse in her father; Arthritis in her mother; Cancer in her brother, mother, and sister; Diabetes in her brother, daughter, father,  sister, and sister; Heart attack in her daughter; Heart disease in her sister; Hyperlipidemia in her brother, mother, and sister; Hypertension in her mother and sister; Kidney disease in her brother; Kidney failure in her brother; Stroke in her mother.    Review of Systems: Review of Systems  HENT: Negative.    Respiratory: Negative.    Cardiovascular: Negative.   Gastrointestinal: Negative.   Musculoskeletal: Negative.        Gait instability  Neurological:  Positive for weakness.  Psychiatric/Behavioral: Negative.    All other systems reviewed and are negative.   PHYSICAL EXAM: VS:  BP (!) 110/50 (BP Location: Left Arm, Patient Position: Sitting, Cuff Size: Normal)   Pulse 71   Ht 5\' 1"  (1.549 m)   Wt 146 lb 8 oz (66.5 kg)   LMP 06/03/1992   SpO2 94%   BMI 27.68 kg/m  , BMI Body mass index is 27.68 kg/m. Constitutional:  oriented to person, place, and time. No distress.  HENT:  Head: Grossly normal Eyes:  no discharge. No scleral icterus.  Neck: No JVD, no carotid bruits  Cardiovascular: Regular rate and rhythm, no murmurs appreciated Pulmonary/Chest: Clear to auscultation bilaterally, no wheezes or rails Abdominal: Soft.  no distension.  no tenderness.  Musculoskeletal: Normal range of motion Neurological:  normal muscle tone. Coordination normal. No atrophy Skin: Skin warm and dry, ecchymotic bruising on arms Psychiatric: normal  affect, pleasant  Recent Labs: 06/04/2022: Hemoglobin 11.7; Platelets 262    Lipid Panel Lab Results  Component Value Date   CHOL 99 06/12/2020   HDL 33.20 (L) 06/12/2020   LDLCALC 46 06/10/2019   TRIG 209.0 (H) 06/12/2020      Wt Readings from Last 3 Encounters:  02/18/23 146 lb 8 oz (66.5 kg)  02/17/23 147 lb (66.7 kg)  01/20/23 146 lb 6 oz (66.4 kg)     ASSESSMENT AND PLAN:  HYPERCHOLESTEROLEMIA Repeat lipid panel ordered today  Essential hypertension Blood pressure is well controlled on today's visit. No changes made to the medications.  Nonischemic cardiomyopathy (HCC) Prior catheterization no significant coronary disease echocardiogram from 2019  normal ejection fraction Ejection fraction 40 to 45% January 2020 Most recent ejection fraction 45 to 50% Appears euvolemic, no medication changes made  Cerebral thrombosis with cerebral infarction (HCC) Off aspirin Plavix Started on Xarelto 15 daily for atrial fibrillation by her report, evaluated by EP at [facility] Cholesterol at goal  Acute on chronic renal failure History of poorly controlled hypertension, poorly controlled diabetes  Type 2 diabetes mellitus without complication, unspecified long term insulin use status (HCC) A1c 7.5 Followed by endocrine, diet restriction recommended  Falls  deconditioning, unable to exercise Gait instability, no regular exercise program   Total encounter time more than 40 minutes  Greater than 50% was spent in counseling and coordination of care with the patient    Orders Placed This Encounter  Procedures   EKG 12-Lead     Signed, Dossie Arbour, M.D., Ph.D. 02/18/2023  [facility] [place], [place] [phone]

## 2023-02-17 NOTE — Progress Notes (Signed)
Subjective:    Patient ID: Morgan Hubbard, female    DOB: 1935/04/10, 88 y.o.   MRN: [id number]  HPI Here due to urinary symptoms  Had symptoms in May---she doesn't remember Got 3 days of cephalexin and symptoms resolved  Now with burning and dysuria since 2 days ago No fever  No blood in urine---but has had some blood from rectum Entire commode filled with blood--yesterday No obvious blood in stool but is on toilet paper  Took old med that she had--one tab. Didn't help (yesterday) She doesn't know if it was antibiotic  Current Outpatient Medications on File Prior to Visit  Medication Sig Dispense Refill   albuterol (VENTOLIN HFA) 108 (90 Base) MCG/ACT inhaler Inhale 2 puffs into the lungs every 6 (six) hours as needed for wheezing or shortness of breath. 8 g 0   amLODipine (NORVASC) 2.5 MG tablet Take 2.5 mg by mouth at bedtime.     atorvastatin (LIPITOR) 80 MG tablet Take 1 tablet by mouth once daily 90 tablet 0   BIOTIN 5000 PO Take 5,000 mg by mouth daily.     carvedilol (COREG) 12.5 MG tablet Take 1 tablet (12.5 mg total) by mouth 2 (two) times daily with a meal. 180 tablet 0   cetirizine (ZYRTEC) 10 MG tablet Take 10 mg by mouth daily.     clotrimazole-betamethasone (LOTRISONE) cream Apply 1 application topically as needed (groin).      Continuous Glucose Sensor (FREESTYLE LIBRE 3 SENSOR) MISC Use to check glucose continuously 6 each 3   dapagliflozin propanediol (FARXIGA) 5 MG TABS tablet Take 5 mg by mouth daily.     famotidine (PEPCID) 20 MG tablet Take 1 tablet by mouth twice daily 180 tablet 1   gabapentin (NEURONTIN) 300 MG capsule Take 1 capsule (300 mg total) by mouth 3 (three) times daily. 270 capsule 3   glimepiride (AMARYL) 2 MG tablet Take 2 mg by mouth daily with breakfast.     hydrALAZINE (APRESOLINE) 50 MG tablet Take 1 tablet (50 mg total) by mouth 3 (three) times daily. 270 tablet 3   insulin degludec (TRESIBA) 100 UNIT/ML FlexTouch Pen Inject 26 Units into  the skin at bedtime.     isosorbide mononitrate (IMDUR) 60 MG 24 hr tablet Take 1 tablet (60 mg total) by mouth in the morning and at bedtime. 180 tablet 3   letrozole (FEMARA) 2.5 MG tablet Take 2.5 mg by mouth daily.     nystatin cream (MYCOSTATIN) Apply 1 application topically 2 (two) times daily as needed (irritation.).      pantoprazole (PROTONIX) 40 MG tablet Take 1 tablet by mouth once daily 90 tablet 1   Respiratory Therapy Supplies (NEBULIZER/TUBING/MOUTHPIECE) KIT 1 each by Does not apply route 3 (three) times daily as needed. 1 kit 0   Semaglutide, 2 MG/DOSE, (OZEMPIC, 2 MG/DOSE,) 8 MG/3ML SOPN Inject 2 mg into the skin once a week.     traMADol (ULTRAM) 50 MG tablet Take 1-2 tablets (50-100 mg total) by mouth every 6 (six) hours as needed for severe pain. Caution of sedation 60 tablet 0   [name] ([name]) 75 MG TABS Take 1 tablet (75 mg total) by mouth daily. 90 tablet 1   vitamin B-12 (CYANOCOBALAMIN) 1000 MCG tablet Take 1,000 mcg by mouth daily.      Vitamin D, Ergocalciferol, (DRISDOL) 1.25 MG (50000 UT) CAPS capsule Take 50,000 Units by mouth every 7 (seven) days.     Rivaroxaban (XARELTO) 15 MG TABS tablet Take  15 mg by mouth daily with supper.     No current facility-administered medications on file prior to visit.    Allergies  Allergen Reactions   Ace Inhibitors Swelling    REACTION: tongue swelling   Lisinopril Swelling    Swelling of tongue   Insulin Detemir Itching   Nsaids Other (See Comments)    Renal issues   Codeine Nausea Only   Hydrochlorothiazide Other (See Comments)    Dizziness/funny feeling   Tylenol [Acetaminophen] Other (See Comments)    Fatty liver - use with caution    Past Medical History:  Diagnosis Date   Arthritis    "hands" (01/13/2014)   Basal cell carcinoma 01/2014   "bridge of nose"   Cardiac LV ejection fraction >40%    "it was 43 last year" (01/13/2014)   Chronic lower back pain    CKD (chronic kidney disease), stage III (HCC)     acute on chronic stage III/notes 06/10/2018   Colon polyps    Expressive aphasia    "3 times in the last week" (01/13/2014)   GERD (gastroesophageal reflux disease)    Goiter past remote   treated with RI   Hyperlipidemia    Hyperpotassemia    Hypertension    Hypertonicity of bladder    Inflammatory and toxic neuropathy, unspecified    LBBB (left bundle branch block)    Migraine    "stopped many years ago" (01/13/2014)   Mini stroke 01/2014   Other primary cardiomyopathies    Overactive bladder    Presence of combination internal cardiac defibrillator (ICD) and pacemaker    Reflex sympathetic dystrophy, unspecified    Shingles    Shortness of breath    ambulation   Stroke (HCC)    Thyroid disease    Type II diabetes mellitus (HCC)    Ulcer    Urine incontinence    Vertigo    hx of    Past Surgical History:  Procedure Laterality Date   BACK SURGERY     BI-VENTRICULAR PACEMAKER INSERTION (CRT-P)  10/2014   [facility]   BREAST CYST EXCISION Left 1959   CARDIAC CATHETERIZATION  "several"   [place]   CATARACT EXTRACTION W/ INTRAOCULAR LENS IMPLANT Left ~ 2005   several eye injections   CATARACT EXTRACTION W/PHACO Right 05/04/2020   Procedure: CATARACT EXTRACTION PHACO AND INTRAOCULAR LENS PLACEMENT (IOC) RIGHT DIABETIC;  Surgeon: Galen Manila, MD;  Location: [facility] ORS;  Service: Ophthalmology;  Laterality: Right;  [place] 00:58.5 CDE 6.70 Fluid Pack Lote # [id number]   CHOLECYSTECTOMY N/A 06/13/2018   Procedure: LAPAROSCOPIC CHOLECYSTECTOMY;  Surgeon: Manus Rudd, MD;  Location: [facility] OR;  Service: General;  Laterality: N/A;   COLONOSCOPY  7/12   normal (hx of polyps in past)    CT SCAN  3/12   outside hosp- lung nodule and gallstones   ERCP N/A 06/12/2018   Procedure: ENDOSCOPIC RETROGRADE CHOLANGIOPANCREATOGRAPHY (ERCP);  Surgeon: Jeani Hawking, MD;  Location: [facility] ENDOSCOPY;  Service: Endoscopy;  Laterality: N/A;   FINGER SURGERY Left 1959 & 1976   "knot on index"   KIDNEY  DONATION Left 1989   LUMBAR LAMINECTOMY/DECOMPRESSION MICRODISCECTOMY  1999   LUMBAR LAMINECTOMY/DECOMPRESSION MICRODISCECTOMY  01/31/2012   Procedure: LUMBAR LAMINECTOMY/DECOMPRESSION MICRODISCECTOMY 2 LEVELS;  Surgeon: Tia Alert, MD;  Location: [facility] NEURO ORS;  Service: Neurosurgery;  Laterality: N/A;  Thoracic twelve-lumbar one, lumbar one-two laminectomy    POSTERIOR LAMINECTOMY / DECOMPRESSION CERVICAL SPINE  1999   REMOVAL OF STONES  06/12/2018  Procedure: REMOVAL OF STONES;  Surgeon: Jeani Hawking, MD;  Location: [facility] ENDOSCOPY;  Service: Endoscopy;;   SPHINCTEROTOMY  06/12/2018   Procedure: Dennison Mascot;  Surgeon: Jeani Hawking, MD;  Location: [facility] ENDOSCOPY;  Service: Endoscopy;;   TUBAL LIGATION  1976   UPPER GASTROINTESTINAL ENDOSCOPY      Family History  Problem Relation Age of Onset   Arthritis Mother    Cancer Mother        uterine and mouth   Hyperlipidemia Mother    Stroke Mother    Hypertension Mother    Alcohol abuse Father    Diabetes Father    Cancer Sister        breast   Diabetes Sister    Hyperlipidemia Sister    Heart disease Sister    Hypertension Sister    Diabetes Sister    Cancer Brother        lung cancer   Kidney disease Brother    Diabetes Brother    Hyperlipidemia Brother    Kidney failure Brother    Diabetes Daughter    Heart attack Daughter    Addison's disease Other     Social History   Socioeconomic History   Marital status: Married    Spouse name: Not on file   Number of children: 2   Years of education: Not on file   Highest education level: Not on file  Occupational History   Occupation: retired - Freight forwarder   Tobacco Use   Smoking status: Never   Smokeless tobacco: Never  Vaping Use   Vaping status: Never Used  Substance and Sexual Activity   Alcohol use: No    Alcohol/week: 0.0 standard drinks of alcohol   Drug use: No   Sexual activity: Yes  Other Topics Concern   Not on file  Social History Narrative    Married 2nd time   Daughter is Therapist, occupational    Social Determinants of Health   Financial Resource Strain: Low Risk  (05/31/2022)   Overall Financial Resource Strain (CARDIA)    Difficulty of Paying Living Expenses: Not hard at all  Food Insecurity: No Food Insecurity (05/31/2022)   Hunger Vital Sign    Worried About Running Out of Food in the Last Year: Never true    Ran Out of Food in the Last Year: Never true  Transportation Needs: No Transportation Needs (05/31/2022)   PRAPARE - Administrator, Civil Service (Medical): No    Lack of Transportation (Non-Medical): No  Physical Activity: Inactive (05/30/2021)   Exercise Vital Sign    Days of Exercise per Week: 0 days    Minutes of Exercise per Session: 0 min  Stress: No Stress Concern Present (05/31/2022)   [employer] of Occupational Health - Occupational Stress Questionnaire    Feeling of Stress : Not at all  Social Connections: Moderately Isolated (05/31/2022)   Social Connection and Isolation Panel [NHANES]    Frequency of Communication with Friends and Family: More than three times a week    Frequency of Social Gatherings with Friends and Family: Three times a week    Attends Religious Services: Never    Active Member of Clubs or Organizations: No    Attends Banker Meetings: Never    Marital Status: Married  Catering manager Violence: Not At Risk (05/31/2022)   Humiliation, Afraid, Rape, and Kick questionnaire    Fear of Current or Ex-Partner: No    Emotionally Abused: No    Physically  Abused: No    Sexually Abused: No   Review of Systems Chronic back pain---no change No N/V Not eating great since hospitalizations some months ago    Objective:   Physical Exam Constitutional:      Appearance: Normal appearance.  Abdominal:     Palpations: Abdomen is soft.  Genitourinary:    Comments: Superficial ulceration caudad by rectum Internal shows no mass and no stool or blood in  rectum Neurological:     Mental Status: She is alert.            Assessment & Plan:

## 2023-02-17 NOTE — Telephone Encounter (Signed)
Patient called in and scheduled an appointment later today.

## 2023-02-17 NOTE — Telephone Encounter (Signed)
Per Dr. Milinda Antis pt needs an appt with anyone available. Please schedule an appt. (I also sent her a mychart letting her know)

## 2023-02-17 NOTE — Assessment & Plan Note (Signed)
Has apparent source of bleeding external---either excoriated with wiping or might be a hemorrhoid that opened and skin came off Will try hydrocortisone cream 2.5% If ongoing bleed, will need to proceed with GI evaluation

## 2023-02-18 ENCOUNTER — Ambulatory Visit: Payer: Medicare HMO | Attending: Cardiovascular Disease | Admitting: Cardiovascular Disease

## 2023-02-18 ENCOUNTER — Encounter: Payer: Self-pay | Admitting: Cardiovascular Disease

## 2023-02-18 VITALS — BP 110/50 | HR 71 | Ht 61.0 in | Wt 146.5 lb

## 2023-02-18 DIAGNOSIS — E119 Type 2 diabetes mellitus without complications: Secondary | ICD-10-CM

## 2023-02-18 DIAGNOSIS — I428 Other cardiomyopathies: Secondary | ICD-10-CM | POA: Diagnosis not present

## 2023-02-18 DIAGNOSIS — E7800 Pure hypercholesterolemia, unspecified: Secondary | ICD-10-CM | POA: Diagnosis not present

## 2023-02-18 DIAGNOSIS — I5022 Chronic systolic (congestive) heart failure: Secondary | ICD-10-CM | POA: Diagnosis not present

## 2023-02-18 DIAGNOSIS — Z79899 Other long term (current) drug therapy: Secondary | ICD-10-CM | POA: Diagnosis not present

## 2023-02-18 DIAGNOSIS — I10 Essential (primary) hypertension: Secondary | ICD-10-CM | POA: Diagnosis not present

## 2023-02-18 DIAGNOSIS — R0602 Shortness of breath: Secondary | ICD-10-CM | POA: Diagnosis not present

## 2023-02-18 MED ORDER — ISOSORBIDE MONONITRATE ER 60 MG PO TB24: 60.0000 mg | ORAL_TABLET | Freq: Two times a day (BID) | ORAL | 3 refills | Status: DC

## 2023-02-18 MED ORDER — ATORVASTATIN CALCIUM 80 MG PO TABS: 80.0000 mg | ORAL_TABLET | Freq: Every day | ORAL | 3 refills | Status: DC

## 2023-02-18 MED ORDER — HYDRALAZINE HCL 50 MG PO TABS: 50.0000 mg | ORAL_TABLET | Freq: Three times a day (TID) | ORAL | 3 refills | Status: DC

## 2023-02-18 MED ORDER — CARVEDILOL 12.5 MG PO TABS: 12.5000 mg | ORAL_TABLET | Freq: Two times a day (BID) | ORAL | 3 refills | Status: DC

## 2023-02-18 NOTE — Patient Instructions (Addendum)
Medication Instructions:  Stop the aspirin  If you need a refill on your cardiac medications before your next appointment, please call your pharmacy.   Lab work: Lipid panel today  Testing/Procedures: No new testing needed  Follow-Up: At [facility], you and your health needs are our priority.  As part of our continuing mission to provide you with exceptional heart care, we have created designated Provider Care Teams.  These Care Teams include your primary Cardiologist (physician) and Advanced Practice Providers (APPs -  Physician Assistants and Nurse Practitioners) who all work together to provide you with the care you need, when you need it.  You will need a follow up appointment in 12 months  Providers on your designated Care Team:   Nicolasa Ducking, NP Eula Listen, PA-C Cadence Fransico Michael, [place]  COVID-19 Vaccine Information can be found at: [email or web address] For questions related to vaccine distribution or appointments, please email vaccine@ [email or web address] or call [phone].

## 2023-02-20 DIAGNOSIS — H6063 Unspecified chronic otitis externa, bilateral: Secondary | ICD-10-CM | POA: Diagnosis not present

## 2023-02-20 DIAGNOSIS — R490 Dysphonia: Secondary | ICD-10-CM | POA: Diagnosis not present

## 2023-02-20 DIAGNOSIS — H903 Sensorineural hearing loss, bilateral: Secondary | ICD-10-CM | POA: Diagnosis not present

## 2023-02-20 LAB — URINE CULTURE
MICRO NUMBER:: 15471320
SPECIMEN QUALITY:: ADEQUATE

## 2023-03-08 ENCOUNTER — Other Ambulatory Visit: Payer: Self-pay | Admitting: Family Medicine

## 2023-03-11 NOTE — Telephone Encounter (Signed)
Name of Medication: Tramadol Name of Pharmacy: [facility] [address] Last Fill or Written Date and Quantity: 12/24/22 #60 tab/ 0 refills  Last Office Visit and Type: acute with Dr. Alphonsus Sias 02/17/23 Next Office Visit and Type: CPE 04/02/23

## 2023-03-22 ENCOUNTER — Other Ambulatory Visit: Payer: Self-pay | Admitting: Family Medicine

## 2023-03-23 ENCOUNTER — Encounter: Payer: Self-pay | Admitting: Family Medicine

## 2023-03-24 ENCOUNTER — Encounter: Payer: Self-pay | Admitting: Family Medicine

## 2023-03-24 ENCOUNTER — Ambulatory Visit: Payer: Medicare HMO | Admitting: Family Medicine

## 2023-03-24 ENCOUNTER — Telehealth: Payer: Self-pay | Admitting: Family Medicine

## 2023-03-24 VITALS — BP 130/60 | HR 78 | Temp 98.5°F | Ht 60.0 in | Wt 147.4 lb

## 2023-03-24 DIAGNOSIS — R7989 Other specified abnormal findings of blood chemistry: Secondary | ICD-10-CM

## 2023-03-24 DIAGNOSIS — R051 Acute cough: Secondary | ICD-10-CM

## 2023-03-24 DIAGNOSIS — D649 Anemia, unspecified: Secondary | ICD-10-CM

## 2023-03-24 DIAGNOSIS — Z79899 Other long term (current) drug therapy: Secondary | ICD-10-CM | POA: Insufficient documentation

## 2023-03-24 DIAGNOSIS — J208 Acute bronchitis due to other specified organisms: Secondary | ICD-10-CM

## 2023-03-24 DIAGNOSIS — I10 Essential (primary) hypertension: Secondary | ICD-10-CM

## 2023-03-24 DIAGNOSIS — E1169 Type 2 diabetes mellitus with other specified complication: Secondary | ICD-10-CM

## 2023-03-24 LAB — POC COVID19 BINAXNOW: SARS Coronavirus 2 Ag: NEGATIVE

## 2023-03-24 LAB — POC INFLUENZA A&B (BINAX/QUICKVUE)
Influenza A, POC: NEGATIVE
Influenza B, POC: NEGATIVE

## 2023-03-24 MED ORDER — BENZONATATE 200 MG PO CAPS: 200.0000 mg | ORAL_CAPSULE | Freq: Three times a day (TID) | ORAL | 1 refills | Status: DC | PRN

## 2023-03-24 MED ORDER — DOXYCYCLINE HYCLATE 100 MG PO TABS: 100.0000 mg | ORAL_TABLET | Freq: Two times a day (BID) | ORAL | 0 refills | Status: DC

## 2023-03-24 NOTE — Telephone Encounter (Signed)
-----   Message from Mort Sawyers sent at 03/11/2023  6:32 AM EDT ----- Regarding: RE: Labs Wed. 03/26/23  ----- Message ----- From: Vincenza Hews Sent: 03/10/2023   2:02 PM EDT To: Mort Sawyers, FNP Subject: Labs Wed. 03/26/23                             Hello,  Patient is coming in for CPE labs on Wednesday 03/26/23. Can we get orders please.   Thanks

## 2023-03-24 NOTE — Telephone Encounter (Signed)
Lvm for patient tcb and schedule 

## 2023-03-24 NOTE — Progress Notes (Signed)
Morgan Hubbard T. Morgan Womac, MD, CAQ Sports Medicine [facility] [address] [place], [address]  Phone: [phone]  FAX: [phone]  Morgan Hubbard - 88 y.o. female  MRN [id number]  Date of Birth: July 03, 1934  Date: 03/24/2023  PCP: Judy Pimple, MD  Referral: Judy Pimple, MD  Chief Complaint  Patient presents with   Cough    Started Friday/Sat   Subjective:   Morgan Hubbard is a 88 y.o. very pleasant female patient with Body mass index is 28.78 kg/m. who presents with the following:  The patient is a very nice 88 year old, who I have known for many years.  Acute cough:  Nothing comes up, started around Friday or Saturday. Deep croupy cough.  Has been happening since then.  No fever, chills No ear pain or so throat No n/v Eating and drinking normally  Lab Results  Component Value Date   HGBA1C 7.5 12/19/2022      Review of Systems is noted in the HPI, as appropriate  Objective:   Pulse 78   Temp 98.5 F (36.9 C) (Temporal)   Ht 5' (1.524 m)   Wt 147 lb 6 oz (66.8 kg)   LMP 06/03/1992   SpO2 94%   BMI 28.78 kg/m    Gen: WDWN, NAD. Globally Non-toxic HEENT: Throat clear, w/o exudate, R TM clear, L TM - good landmarks, No fluid present. No rhinnorhea.  MMM Frontal sinuses: NT Max sinuses: NT NECK: Anterior cervical  LAD is absent CV: RRR, No M/G/R, cap refill <2 sec PULM: Breathing comfortably in no respiratory distress. no wheezing.  Right-sided rhonchorous breath sounds  Laboratory and Imaging Data: Results for orders placed or performed in visit on 03/24/23  POC COVID-19  Result Value Ref Range   SARS Coronavirus 2 Ag Negative Negative  POC Influenza A&B (Binax test)  Result Value Ref Range   Influenza A, POC Negative Negative   Influenza B, POC Negative Negative     Assessment and Plan:     ICD-10-CM   1. Acute bronchitis due to other specified organisms  J20.8     2. Acute cough  R05.1 POC  COVID-19    POC Influenza A&B (Binax test)     Viral bronchitis versus developing right-sided lower lobe pneumonia.  Given potential risk in this 88 year old, I am going to place her on antibiotics.  Medication Management during today's office visit: Meds ordered this encounter  Medications   doxycycline (VIBRA-TABS) 100 MG tablet    Sig: Take 1 tablet (100 mg total) by mouth 2 (two) times daily.    Dispense:  20 tablet    Refill:  0   benzonatate (TESSALON) 200 MG capsule    Sig: Take 1 capsule (200 mg total) by mouth 3 (three) times daily as needed for cough.    Dispense:  40 capsule    Refill:  1   Medications Discontinued During This Encounter  Medication Reason   cephALEXin (KEFLEX) 500 MG capsule Completed Course   famotidine (PEPCID) 20 MG tablet Dose change    Orders placed today for conditions managed today: Orders Placed This Encounter  Procedures   POC COVID-19   POC Influenza A&B (Binax test)    Disposition: No follow-ups on file.  Dragon [employer] One speech-to-text software was used for transcription in this dictation.  Possible transcriptional errors can occur using Animal nutritionist.   Signed,  Elpidio Galea. Dontre Laduca, MD   Outpatient Encounter Medications  as of 03/24/2023  Medication Sig   albuterol (VENTOLIN HFA) 108 (90 Base) MCG/ACT inhaler Inhale 2 puffs into the lungs every 6 (six) hours as needed for wheezing or shortness of breath.   amLODipine (NORVASC) 2.5 MG tablet Take 2.5 mg by mouth at bedtime.   atorvastatin (LIPITOR) 80 MG tablet Take 1 tablet (80 mg total) by mouth daily.   benzonatate (TESSALON) 200 MG capsule Take 1 capsule (200 mg total) by mouth 3 (three) times daily as needed for cough.   BIOTIN 5000 PO Take 5,000 mg by mouth daily.   carvedilol (COREG) 12.5 MG tablet Take 1 tablet (12.5 mg total) by mouth 2 (two) times daily with a meal.   cetirizine (ZYRTEC) 10 MG tablet Take 10 mg by mouth daily.   clotrimazole-betamethasone (LOTRISONE)  cream Apply 1 application topically as needed (groin).    Continuous Glucose Sensor (FREESTYLE LIBRE 3 SENSOR) MISC Use to check glucose continuously   dapagliflozin propanediol (FARXIGA) 5 MG TABS tablet Take 5 mg by mouth daily.   doxycycline (VIBRA-TABS) 100 MG tablet Take 1 tablet (100 mg total) by mouth 2 (two) times daily.   famotidine (PEPCID) 20 MG tablet Take 20 mg by mouth daily.   gabapentin (NEURONTIN) 300 MG capsule Take 1 capsule (300 mg total) by mouth 3 (three) times daily.   glimepiride (AMARYL) 2 MG tablet Take 2 mg by mouth daily with breakfast.   hydrALAZINE (APRESOLINE) 50 MG tablet Take 1 tablet (50 mg total) by mouth 3 (three) times daily.   hydrocortisone 2.5 % cream Apply topically 3 (three) times daily as needed.   insulin degludec (TRESIBA) 100 UNIT/ML FlexTouch Pen Inject 26 Units into the skin at bedtime.   isosorbide mononitrate (IMDUR) 60 MG 24 hr tablet Take 1 tablet (60 mg total) by mouth in the morning and at bedtime.   letrozole (FEMARA) 2.5 MG tablet Take 2.5 mg by mouth daily.   LORazepam (ATIVAN) 0.5 MG tablet Take 1 tablet by mouth every 8 (eight) hours as needed.   nystatin cream (MYCOSTATIN) Apply 1 application topically 2 (two) times daily as needed (irritation.).    pantoprazole (PROTONIX) 40 MG tablet Take 1 tablet by mouth once daily   Respiratory Therapy Supplies (NEBULIZER/TUBING/MOUTHPIECE) KIT 1 each by Does not apply route 3 (three) times daily as needed.   Rivaroxaban (XARELTO) 15 MG TABS tablet Take 15 mg by mouth daily with supper.   Semaglutide, 2 MG/DOSE, (OZEMPIC, 2 MG/DOSE,) 8 MG/3ML SOPN Inject 2 mg into the skin once a week.   traMADol (ULTRAM) 50 MG tablet Take 1-2 tablets (50-100 mg total) by mouth every 6 (six) hours as needed for severe pain.   [name] ([name]) 75 MG TABS Take 1 tablet (75 mg total) by mouth daily.   vitamin B-12 (CYANOCOBALAMIN) 1000 MCG tablet Take 1,000 mcg by mouth daily.    Vitamin D, Ergocalciferol, (DRISDOL)  1.25 MG (50000 UT) CAPS capsule Take 50,000 Units by mouth every 7 (seven) days.   [DISCONTINUED] cephALEXin (KEFLEX) 500 MG capsule Take 1 capsule (500 mg total) by mouth 4 (four) times daily.   [DISCONTINUED] famotidine (PEPCID) 20 MG tablet Take 1 tablet by mouth twice daily   No facility-administered encounter medications on file as of 03/24/2023.

## 2023-03-26 ENCOUNTER — Other Ambulatory Visit: Payer: Self-pay | Admitting: Family Medicine

## 2023-03-26 ENCOUNTER — Other Ambulatory Visit: Payer: Medicare HMO

## 2023-03-26 ENCOUNTER — Ambulatory Visit
Admission: RE | Admit: 2023-03-26 | Discharge: 2023-03-26 | Disposition: A | Discharge status: Home / Self Care | Payer: Medicare HMO | Source: Ambulatory Visit | Attending: Family Medicine | Admitting: Family Medicine

## 2023-03-26 ENCOUNTER — Telehealth: Payer: Self-pay | Admitting: Family Medicine

## 2023-03-26 DIAGNOSIS — R7989 Other specified abnormal findings of blood chemistry: Secondary | ICD-10-CM

## 2023-03-26 DIAGNOSIS — R051 Acute cough: Secondary | ICD-10-CM | POA: Diagnosis not present

## 2023-03-26 DIAGNOSIS — E785 Hyperlipidemia, unspecified: Secondary | ICD-10-CM

## 2023-03-26 DIAGNOSIS — Z79899 Other long term (current) drug therapy: Secondary | ICD-10-CM

## 2023-03-26 DIAGNOSIS — Z95810 Presence of automatic (implantable) cardiac defibrillator: Secondary | ICD-10-CM | POA: Diagnosis not present

## 2023-03-26 DIAGNOSIS — E1169 Type 2 diabetes mellitus with other specified complication: Secondary | ICD-10-CM | POA: Diagnosis not present

## 2023-03-26 DIAGNOSIS — D649 Anemia, unspecified: Secondary | ICD-10-CM | POA: Diagnosis not present

## 2023-03-26 DIAGNOSIS — I10 Essential (primary) hypertension: Secondary | ICD-10-CM | POA: Diagnosis not present

## 2023-03-26 DIAGNOSIS — R0989 Other specified symptoms and signs involving the circulatory and respiratory systems: Secondary | ICD-10-CM | POA: Diagnosis not present

## 2023-03-26 DIAGNOSIS — R059 Cough, unspecified: Secondary | ICD-10-CM | POA: Diagnosis not present

## 2023-03-26 DIAGNOSIS — R918 Other nonspecific abnormal finding of lung field: Secondary | ICD-10-CM | POA: Diagnosis not present

## 2023-03-26 LAB — COMPREHENSIVE METABOLIC PANEL
ALT: 23 U/L (ref 0–35)
AST: 24 U/L (ref 0–37)
Albumin: 3.4 g/dL — ABNORMAL LOW (ref 3.5–5.2)
Alkaline Phosphatase: 97 U/L (ref 39–117)
BUN: 24 mg/dL — ABNORMAL HIGH (ref 6–23)
CO2: 32 meq/L (ref 19–32)
Calcium: 9.3 mg/dL (ref 8.4–10.5)
Chloride: 105 meq/L (ref 96–112)
Creatinine, Ser: 1.73 mg/dL — ABNORMAL HIGH (ref 0.40–1.20)
GFR: 26.47 mL/min — ABNORMAL LOW (ref >60.00)
Glucose, Bld: 119 mg/dL — ABNORMAL HIGH (ref 70–99)
Potassium: 5 meq/L (ref 3.5–5.1)
Sodium: 142 meq/L (ref 135–145)
Total Bilirubin: 0.6 mg/dL (ref 0.2–1.2)
Total Protein: 5.8 g/dL — ABNORMAL LOW (ref 6.0–8.3)

## 2023-03-26 LAB — CBC WITH DIFFERENTIAL/PLATELET
Basophils Absolute: 0 10*3/uL (ref 0.0–0.1)
Basophils Relative: 0.4 % (ref 0.0–3.0)
Eosinophils Absolute: 0.5 10*3/uL (ref 0.0–0.7)
Eosinophils Relative: 6 % — ABNORMAL HIGH (ref 0.0–5.0)
HCT: 43.3 % (ref 36.0–46.0)
Hemoglobin: 13.8 g/dL (ref 12.0–15.0)
Lymphocytes Relative: 29 % (ref 12.0–46.0)
Lymphs Abs: 2.6 10*3/uL (ref 0.7–4.0)
MCHC: 31.9 g/dL (ref 30.0–36.0)
MCV: 91.9 fL (ref 78.0–100.0)
Monocytes Absolute: 0.6 10*3/uL (ref 0.1–1.0)
Monocytes Relative: 6.6 % (ref 3.0–12.0)
Neutro Abs: 5.2 10*3/uL (ref 1.4–7.7)
Neutrophils Relative %: 58 % (ref 43.0–77.0)
Platelets: 191 10*3/uL (ref 150.0–400.0)
RBC: 4.71 Mil/uL (ref 3.87–5.11)
RDW: 13.2 % (ref 11.5–15.5)
WBC: 8.9 10*3/uL (ref 4.0–10.5)

## 2023-03-26 LAB — TSH: TSH: 4.43 u[IU]/mL (ref 0.35–5.50)

## 2023-03-26 LAB — LIPID PANEL
Cholesterol: 113 mg/dL (ref 0–200)
HDL: 33 mg/dL — ABNORMAL LOW (ref >39.00)
LDL Cholesterol: 48 mg/dL (ref 0–99)
NonHDL: 79.86
Total CHOL/HDL Ratio: 3
Triglycerides: 161 mg/dL — ABNORMAL HIGH (ref 0.0–149.0)
VLDL: 32.2 mg/dL (ref 0.0–40.0)

## 2023-03-26 LAB — IRON: Iron: 71 ug/dL (ref 42–145)

## 2023-03-26 LAB — VITAMIN B12: Vitamin B-12: >1537 pg/mL — ABNORMAL HIGH (ref 211–911)

## 2023-03-26 LAB — T4, FREE: Free T4: 0.89 ng/dL (ref 0.60–1.60)

## 2023-03-26 NOTE — Telephone Encounter (Signed)
She is currently on treatment for bronchitis and pneumonia. She definitely has a lung infection.  No real reason to get one unless she is worsening, fevers, doing worse in general.

## 2023-03-26 NOTE — Addendum Note (Signed)
Addended by: Alvina Chou on: 03/26/2023 09:26 AM   Modules accepted: Orders

## 2023-03-26 NOTE — Telephone Encounter (Signed)
Pt came by office asking if Dr. Jeydi Lager could order a xray? Pt states she saw Dr. Tylesha Lager on 10/21 for a cough & she's still experiencing the cough. Pt is currently here for labs & asked if she could receive xray while she's in office.

## 2023-03-26 NOTE — Telephone Encounter (Signed)
Morgan Hubbard notified as instructed by telephone.  She states she feels her cough has gotten worse since Monday and she feels wiped out. Spoke with Dr. Hazley Lager.  Ok to order CXR per Dr. Zakirah Lager.  Patient advised that if she starts running a fever on the antibiotics or starts having worsening SOB she needs to follow up with Dr. Milinda Antis.

## 2023-04-01 DIAGNOSIS — E1129 Type 2 diabetes mellitus with other diabetic kidney complication: Secondary | ICD-10-CM | POA: Diagnosis not present

## 2023-04-01 DIAGNOSIS — E785 Hyperlipidemia, unspecified: Secondary | ICD-10-CM | POA: Diagnosis not present

## 2023-04-01 DIAGNOSIS — I10 Essential (primary) hypertension: Secondary | ICD-10-CM | POA: Diagnosis not present

## 2023-04-01 DIAGNOSIS — E1122 Type 2 diabetes mellitus with diabetic chronic kidney disease: Secondary | ICD-10-CM | POA: Diagnosis not present

## 2023-04-01 DIAGNOSIS — E1169 Type 2 diabetes mellitus with other specified complication: Secondary | ICD-10-CM | POA: Diagnosis not present

## 2023-04-01 DIAGNOSIS — N1832 Chronic kidney disease, stage 3b: Secondary | ICD-10-CM | POA: Diagnosis not present

## 2023-04-01 DIAGNOSIS — Z794 Long term (current) use of insulin: Secondary | ICD-10-CM | POA: Diagnosis not present

## 2023-04-01 DIAGNOSIS — R809 Proteinuria, unspecified: Secondary | ICD-10-CM | POA: Diagnosis not present

## 2023-04-02 ENCOUNTER — Ambulatory Visit (INDEPENDENT_AMBULATORY_CARE_PROVIDER_SITE_OTHER): Payer: Medicare HMO | Admitting: Family Medicine

## 2023-04-02 ENCOUNTER — Encounter: Payer: Self-pay | Admitting: Family Medicine

## 2023-04-02 VITALS — BP 146/62 | HR 70 | Temp 98.6°F | Ht 60.5 in | Wt 147.1 lb

## 2023-04-02 DIAGNOSIS — J209 Acute bronchitis, unspecified: Secondary | ICD-10-CM

## 2023-04-02 MED ORDER — PROMETHAZINE-DM 6.25-15 MG/5ML PO SYRP: 5.0000 mL | ORAL_SOLUTION | Freq: Every evening | ORAL | 0 refills | Status: DC | PRN

## 2023-04-02 MED ORDER — PREDNISONE 20 MG PO TABS: 20.0000 mg | ORAL_TABLET | Freq: Every day | ORAL | 0 refills | Status: DC

## 2023-04-02 MED ORDER — ALBUTEROL SULFATE HFA 108 (90 BASE) MCG/ACT IN AERS
2.0000 | INHALATION_SPRAY | Freq: Four times a day (QID) | RESPIRATORY_TRACT | 1 refills | Status: DC | PRN
Start: 2023-04-02 — End: 2023-12-29

## 2023-04-02 NOTE — Progress Notes (Signed)
Subjective:    Patient ID: Morgan Hubbard, female    DOB: 01-17-1935, 88 y.o.   MRN: [id number]  HPI  Wt Readings from Last 3 Encounters:  04/02/23 147 lb 2 oz (66.7 kg)  03/24/23 147 lb 6 oz (66.8 kg)  02/18/23 146 lb 8 oz (66.5 kg)   28.26 kg/m  Vitals:   04/02/23 1424  BP: (!) 146/62  Pulse: 70  Temp: 98.6 F (37 C)  SpO2: 93%    Pt is here for a sick visit   Was seen by Dr Antaniya Lager on 10/21 for uri and bronchitis Treated with doxycycline and tessalon pearles   Per pt top right lung sounded off   Did cxr-still pending -called to get this read stat   Deep croupy cough  No fever or chills  Pulse ox is 93% after ambulation today   Covid and flu tests were negative   Has ventolin mdi -not using  Neb machine    Cough is improved but not gone  Sounds junky but nothing is moving  No energy  Very little appetite (is on ozempic)   Not very short of breath  No wheezing  Some rattling   No nasal symptoms  Throat is ok   Right ear always bothers her / does not hear well out of it  ENT got some tissue out of the right ear and it helped   No fever  Not chilled / is cold natured  No aches     Over the counter  Tussin dm  Drinking fluids - ? Not enough   A1c recent was 7 Blood glucose is up and down  Yesterday her glyburide was d/c   DG Chest 2 View  Result Date: 04/02/2023 CLINICAL DATA:  Cough and congestion. EXAM: CHEST - 2 VIEW COMPARISON:  Radiographs 07/30/2022, 07/12/2022 and 11/29/2021. CT 09/26/2014. FINDINGS: Portions of the posterior chest are excluded from the lateral view. Left subclavian and ICD leads appear unchanged. The heart size and mediastinal contours are stable with mild cardiomegaly and aortic atherosclerosis. Interval mildly increased interstitial prominence with linear density in the region of the minor fissure which could reflect fissural thickening or adjacent subsegmental atelectasis. No confluent airspace disease, definite edema  or significant pleural effusion. There is no pneumothorax. The bones appear unchanged. There are degenerative changes in the spine associated with a convex left lumbar scoliosis. IMPRESSION: New linear opacity near the minor fissure which may reflect fissural thickening or subsegmental atelectasis. No other significant changes identified. No edema or confluent airspace disease. Electronically Signed   By: Carey Bullocks M.D.   On: 04/02/2023 14:35     Patient Active Problem List   Diagnosis Date Noted   Acute bronchitis 04/02/2023   Current use of proton pump inhibitor 03/24/2023   Rectal bleeding 02/17/2023   Senile purpura (HCC) 01/20/2023   Acute cystitis without hematuria 10/23/2022   Stage 3 chronic kidney disease, unspecified whether stage 3a or 3b CKD (HCC) 08/05/2022   Pain due to onychomycosis of toenails of both feet 04/04/2022   History of breast cancer 06/18/2020   Anemia 06/16/2020   Elevated TSH 06/16/2020   Occipital neuralgia 02/14/2020   Hair loss 11/09/2018   Leg weakness, bilateral 06/29/2018   Pedal edema 06/22/2018   AICD (automatic cardioverter/defibrillator) present 06/11/2018   GERD (gastroesophageal reflux disease) 06/10/2018   AKI (acute kidney injury) (HCC) 06/10/2018   Cholecystitis 06/10/2018   Chronic back pain 05/23/2017   Chronic systolic (congestive) heart  failure (HCC) 02/25/2017   Osteopenia 06/16/2016   Estrogen deficiency 04/24/2016   Stroke, small vessel (HCC) 01/18/2014   Family history of hemochromatosis 01/18/2014   Cerebral thrombosis with cerebral infarction (HCC) 01/14/2014   Expressive aphasia 01/13/2014   Nonischemic cardiomyopathy (HCC) 04/05/2013   Cerumen impaction 06/16/2012   Elevated transaminase level 05/18/2012   Spinal stenosis of lumbar region 12/10/2011   Mixed incontinence urge and stress 12/10/2011   Renal insufficiency 11/04/2011   Goiter 01/14/2011   Fatty liver 08/28/2010   Type II diabetes mellitus with renal  manifestations (HCC) 07/09/2010   Hyperlipidemia associated with type 2 diabetes mellitus (HCC) 07/09/2010   Hyperkalemia 07/09/2010   Reflex sympathetic dystrophy 07/09/2010   Neuropathy 07/09/2010   Essential hypertension 07/09/2010   CARDIOMYOPATHY 07/09/2010   OVERACTIVE BLADDER 07/09/2010   Past Medical History:  Diagnosis Date   Arthritis    "hands" (01/13/2014)   Basal cell carcinoma 01/2014   "bridge of nose"   Cardiac LV ejection fraction >40%    "it was 43 last year" (01/13/2014)   Chronic lower back pain    CKD (chronic kidney disease), stage III (HCC)    acute on chronic stage III/notes 06/10/2018   Colon polyps    Expressive aphasia    "3 times in the last week" (01/13/2014)   GERD (gastroesophageal reflux disease)    Goiter past remote   treated with RI   Hyperlipidemia    Hyperpotassemia    Hypertension    Hypertonicity of bladder    Inflammatory and toxic neuropathy, unspecified    LBBB (left bundle branch block)    Migraine    "stopped many years ago" (01/13/2014)   Mini stroke 01/2014   Other primary cardiomyopathies    Overactive bladder    Presence of combination internal cardiac defibrillator (ICD) and pacemaker    Reflex sympathetic dystrophy, unspecified    Shingles    Shortness of breath    ambulation   Stroke (HCC)    Thyroid disease    Type II diabetes mellitus (HCC)    Ulcer    Urine incontinence    Vertigo    hx of   Past Surgical History:  Procedure Laterality Date   BACK SURGERY     BI-VENTRICULAR PACEMAKER INSERTION (CRT-P)  10/2014   [facility]   BREAST CYST EXCISION Left 1959   CARDIAC CATHETERIZATION  "several"   [place]   CATARACT EXTRACTION W/ INTRAOCULAR LENS IMPLANT Left ~ 2005   several eye injections   CATARACT EXTRACTION W/PHACO Right 05/04/2020   Procedure: CATARACT EXTRACTION PHACO AND INTRAOCULAR LENS PLACEMENT (IOC) RIGHT DIABETIC;  Surgeon: Galen Manila, MD;  Location: [facility] ORS;  Service: Ophthalmology;  Laterality:  Right;  [place] 00:58.5 CDE 6.70 Fluid Pack [place] # [id number]   CHOLECYSTECTOMY N/A 06/13/2018   Procedure: LAPAROSCOPIC CHOLECYSTECTOMY;  Surgeon: Manus Rudd, MD;  Location: [facility] OR;  Service: General;  Laterality: N/A;   COLONOSCOPY  7/12   normal (hx of polyps in past)    CT SCAN  3/12   outside hosp- lung nodule and gallstones   ERCP N/A 06/12/2018   Procedure: ENDOSCOPIC RETROGRADE CHOLANGIOPANCREATOGRAPHY (ERCP);  Surgeon: Jeani Hawking, MD;  Location: [facility] ENDOSCOPY;  Service: Endoscopy;  Laterality: N/A;   FINGER SURGERY Left 1959 & 1976   "knot on index"   KIDNEY DONATION Left 1989   LUMBAR LAMINECTOMY/DECOMPRESSION MICRODISCECTOMY  1999   LUMBAR LAMINECTOMY/DECOMPRESSION MICRODISCECTOMY  01/31/2012   Procedure: LUMBAR LAMINECTOMY/DECOMPRESSION MICRODISCECTOMY 2 LEVELS;  Surgeon: Tia Alert,  MD;  Location: [facility] NEURO ORS;  Service: Neurosurgery;  Laterality: N/A;  Thoracic twelve-lumbar one, lumbar one-two laminectomy    POSTERIOR LAMINECTOMY / DECOMPRESSION CERVICAL SPINE  1999   REMOVAL OF STONES  06/12/2018   Procedure: REMOVAL OF STONES;  Surgeon: Jeani Hawking, MD;  Location: [facility] ENDOSCOPY;  Service: Endoscopy;;   SPHINCTEROTOMY  06/12/2018   Procedure: Dennison Mascot;  Surgeon: Jeani Hawking, MD;  Location: [facility] ENDOSCOPY;  Service: Endoscopy;;   TUBAL LIGATION  1976   UPPER GASTROINTESTINAL ENDOSCOPY     Social History   Tobacco Use   Smoking status: Never   Smokeless tobacco: Never  Vaping Use   Vaping status: Never Used  Substance Use Topics   Alcohol use: No    Alcohol/week: 0.0 standard drinks of alcohol   Drug use: No   Family History  Problem Relation Age of Onset   Arthritis Mother    Cancer Mother        uterine and mouth   Hyperlipidemia Mother    Stroke Mother    Hypertension Mother    Alcohol abuse Father    Diabetes Father    Cancer Sister        breast   Diabetes Sister    Hyperlipidemia Sister    Heart disease Sister    Hypertension Sister     Diabetes Sister    Cancer Brother        lung cancer   Kidney disease Brother    Diabetes Brother    Hyperlipidemia Brother    Kidney failure Brother    Diabetes Daughter    Heart attack Daughter    Addison's disease Other    Allergies  Allergen Reactions   Ace Inhibitors Swelling    REACTION: tongue swelling   Lisinopril Swelling    Swelling of tongue   Insulin Detemir Itching   Nsaids Other (See Comments)    Renal issues   Codeine Nausea Only   Hydrochlorothiazide Other (See Comments)    Dizziness/funny feeling   Tylenol [Acetaminophen] Other (See Comments)    Fatty liver - use with caution   Current Outpatient Medications on File Prior to Visit  Medication Sig Dispense Refill   amLODipine (NORVASC) 2.5 MG tablet Take 2.5 mg by mouth at bedtime.     atorvastatin (LIPITOR) 80 MG tablet Take 1 tablet (80 mg total) by mouth daily. 90 tablet 3   benzonatate (TESSALON) 200 MG capsule Take 1 capsule (200 mg total) by mouth 3 (three) times daily as needed for cough. 40 capsule 1   BIOTIN 5000 PO Take 5,000 mg by mouth daily.     carvedilol (COREG) 12.5 MG tablet Take 1 tablet (12.5 mg total) by mouth 2 (two) times daily with a meal. 180 tablet 3   cetirizine (ZYRTEC) 10 MG tablet Take 10 mg by mouth daily.     clotrimazole-betamethasone (LOTRISONE) cream Apply 1 application topically as needed (groin).      Continuous Glucose Sensor (FREESTYLE LIBRE 3 SENSOR) MISC Use to check glucose continuously 6 each 3   dapagliflozin propanediol (FARXIGA) 5 MG TABS tablet Take 5 mg by mouth daily.     doxycycline (VIBRA-TABS) 100 MG tablet Take 1 tablet (100 mg total) by mouth 2 (two) times daily. 20 tablet 0   famotidine (PEPCID) 20 MG tablet Take 1 tablet (20 mg total) by mouth daily. 90 tablet 0   gabapentin (NEURONTIN) 300 MG capsule Take 1 capsule (300 mg total) by mouth 3 (three) times daily. 270  capsule 3   hydrALAZINE (APRESOLINE) 50 MG tablet Take 1 tablet (50 mg total) by mouth 3  (three) times daily. 270 tablet 3   hydrocortisone 2.5 % cream Apply topically 3 (three) times daily as needed. 28 g 3   insulin degludec (TRESIBA) 100 UNIT/ML FlexTouch Pen Inject 26 Units into the skin at bedtime.     isosorbide mononitrate (IMDUR) 60 MG 24 hr tablet Take 1 tablet (60 mg total) by mouth in the morning and at bedtime. 180 tablet 3   letrozole (FEMARA) 2.5 MG tablet Take 2.5 mg by mouth daily.     LORazepam (ATIVAN) 0.5 MG tablet Take 1 tablet by mouth every 8 (eight) hours as needed.     nystatin cream (MYCOSTATIN) Apply 1 application topically 2 (two) times daily as needed (irritation.).      pantoprazole (PROTONIX) 40 MG tablet Take 1 tablet by mouth once daily 90 tablet 1   Respiratory Therapy Supplies (NEBULIZER/TUBING/MOUTHPIECE) KIT 1 each by Does not apply route 3 (three) times daily as needed. 1 kit 0   Rivaroxaban (XARELTO) 15 MG TABS tablet Take 15 mg by mouth daily with supper.     Semaglutide, 2 MG/DOSE, (OZEMPIC, 2 MG/DOSE,) 8 MG/3ML SOPN Inject 2 mg into the skin once a week.     traMADol (ULTRAM) 50 MG tablet Take 1-2 tablets (50-100 mg total) by mouth every 6 (six) hours as needed for severe pain. 60 tablet 0   [name] ([name]) 75 MG TABS Take 1 tablet (75 mg total) by mouth daily. 90 tablet 1   vitamin B-12 (CYANOCOBALAMIN) 1000 MCG tablet Take 1,000 mcg by mouth daily.      Vitamin D, Ergocalciferol, (DRISDOL) 1.25 MG (50000 UT) CAPS capsule Take 50,000 Units by mouth every 7 (seven) days.     No current facility-administered medications on file prior to visit.    Review of Systems     Objective:   Physical Exam Constitutional:      General: She is not in acute distress.    Appearance: Normal appearance. She is well-developed and normal weight. She is not ill-appearing, toxic-appearing or diaphoretic.     Comments: Frail appearing   HENT:     Head: Normocephalic and atraumatic.     Comments: Nares are injected and congested      Right Ear: Tympanic  membrane, ear canal and external ear normal.     Left Ear: Tympanic membrane, ear canal and external ear normal.     Nose: Rhinorrhea present. No congestion.     Comments: Mild rhinorrhea     Mouth/Throat:     Mouth: Mucous membranes are moist.     Pharynx: Oropharynx is clear. No oropharyngeal exudate or posterior oropharyngeal erythema.     Comments: Clear pnd  Eyes:     General:        Right eye: No discharge.        Left eye: No discharge.     Conjunctiva/sclera: Conjunctivae normal.     Pupils: Pupils are equal, round, and reactive to light.  Cardiovascular:     Rate and Rhythm: Normal rate.     Heart sounds: Normal heart sounds.  Pulmonary:     Effort: Pulmonary effort is normal. No respiratory distress.     Breath sounds: No stridor. Rhonchi present. No wheezing or rales.     Comments: Harsh bs  Some scattered rhonchi Wheeze only wit forced expiration No rales   No shortness of breath with speech Chest:  Chest wall: No tenderness.  Musculoskeletal:     Cervical back: Normal range of motion and neck supple.  Lymphadenopathy:     Cervical: No cervical adenopathy.  Skin:    General: Skin is warm and dry.     Capillary Refill: Capillary refill takes less than 2 seconds.     Findings: No rash.  Neurological:     Mental Status: She is alert.     Cranial Nerves: No cranial nerve deficit.  Psychiatric:        Mood and Affect: Mood normal.           Assessment & Plan:   Problem List Items Addressed This Visit       Respiratory   Acute bronchitis - Primary    Reviewed notes, plan and cxr report from Dr Blondina Lager from 03/24/23 Flu and covid tests were negative at that time Symptoms have not improved much with doxy and tessalon  Chest sounds congested but no phlegm is moving  Reassuring exam overall / rhonchi noted  No lobar pneumonia noted on csr (did have a linear opacity near minor fissure possible from thickening or atelectasis)  Prescription prednisone  20 mg daily 7 d (watching blood glucose) Continue tessalon and tussin dm for day time cough Prescription prometh dm for pm cough with great caution for sedation and falls  Also albuterol mdi refilled   Will watch closely  Update if not starting to improve in a week or if worsening  Call back and Er precautions noted in detail today    See AVS        Other Visit Diagnoses     Acute bronchitis with wheezing       Relevant Medications   albuterol (VENTOLIN HFA) 108 (90 Base) MCG/ACT inhaler

## 2023-04-02 NOTE — Patient Instructions (Addendum)
Take prednisone as directed for bronchitis  This will raise blood sugar while you are on it  Watch for wheezing and shortness of breath  Finish the doxycycline   Continue the tussin dm during the day (with fluids)  You can try the prescription promethazine dm for night time cough-use caution of sedation   Try your albuterol inhaler for cough and tight chest as needed   Update if not starting to improve in a week or if worsening   If any severe symptoms (like trouble breathing or fever)- go to the ER    You can re schedule your annual exam

## 2023-04-02 NOTE — Assessment & Plan Note (Signed)
Reviewed notes, plan and cxr report from Dr Hatice Lager from 03/24/23 Flu and covid tests were negative at that time Symptoms have not improved much with doxy and tessalon  Chest sounds congested but no phlegm is moving  Reassuring exam overall / rhonchi noted  No lobar pneumonia noted on csr (did have a linear opacity near minor fissure possible from thickening or atelectasis)  Prescription prednisone 20 mg daily 7 d (watching blood glucose) Continue tessalon and tussin dm for day time cough Prescription prometh dm for pm cough with great caution for sedation and falls  Also albuterol mdi refilled   Will watch closely  Update if not starting to improve in a week or if worsening  Call back and Er precautions noted in detail today    See AVS

## 2023-04-07 ENCOUNTER — Telehealth: Payer: Self-pay | Admitting: Pharmacist

## 2023-04-07 NOTE — Telephone Encounter (Signed)
Received voicemail from patient noting that her Xarelto is expensive. We had previously enrolled her in the [place] Xarelto With Me program. Routing note to Lillia Abed to outreach patient to review.   Catie Eppie Gibson, PharmD, BCACP, CPP Clinical Pharmacist [facility] [phone]

## 2023-04-09 ENCOUNTER — Other Ambulatory Visit: Payer: Self-pay | Admitting: Pharmacist

## 2023-04-09 NOTE — Progress Notes (Signed)
Called Mrs Baez to discuss cost of Xarelto.   We reviewed MAP as well as Xarelto with Me program for copay assistance. Patient reports that she has a 74-month supply of Xarelto at home currently (recent picked up a refill). With this, we agreed that Xarelto with Me program is likely not beneficial for this year.   Reviewed with patient the elimination of the donut hole for all Medicare plans in 2025, hopefully resolving this issue for the next calendar year.   Patient inquired about re-enrollment for AZ&Me and Novo for 2025.  Looks like Med assistance team assisted her with Novo enrollment in 2024 for [place].  Will collaborate with CPhT team to facilitate MAP re-enrollment  Loree Fee, PharmD Clinical Pharmacist [facility] [phone]

## 2023-04-17 ENCOUNTER — Other Ambulatory Visit: Payer: Self-pay | Admitting: Family Medicine

## 2023-04-17 DIAGNOSIS — N318 Other neuromuscular dysfunction of bladder: Secondary | ICD-10-CM

## 2023-04-19 ENCOUNTER — Other Ambulatory Visit: Payer: Self-pay | Admitting: Family Medicine

## 2023-04-19 DIAGNOSIS — N318 Other neuromuscular dysfunction of bladder: Secondary | ICD-10-CM

## 2023-04-22 NOTE — Progress Notes (Unsigned)
Subjective:    Patient ID: Morgan Hubbard, female    DOB: 1935/03/21, 88 y.o.   MRN: [id number]  HPI  Here for health maintenance exam and to review chronic medical problems  Also cough, dysuria and right 5th toe injury   Wt Readings from Last 3 Encounters:  04/23/23 147 lb (66.7 kg)  04/02/23 147 lb 2 oz (66.7 kg)  03/24/23 147 lb 6 oz (66.8 kg)   28.71 kg/m  Vitals:   04/23/23 1354  BP: 138/64  Pulse: 74  Temp: 97.7 F (36.5 C)  SpO2: 98%    Immunization History  Administered Date(s) Administered   Fluad Quad(high Dose 65+) 02/14/2020, 02/15/2021   Influenza,inj,Quad PF,6+ Mos 05/24/2015, 04/03/2016, 03/09/2018   Influenza-Unspecified 03/29/2013, 04/11/2014, 04/03/2016, 03/17/2017   PFIZER(Purple Top)SARS-COV-2 Vaccination 07/16/2019, 08/06/2019, 03/03/2020   Pneumococcal Conjugate-13 06/13/2014   Pneumococcal Polysaccharide-23 01/14/2011    Health Maintenance Due  Topic Date Due   DEXA SCAN  07/21/2020   FOOT EXAM  04/05/2023   Eye exam -planned for Monday with Dr Barnett Abu   Foot exam-note right 5th toe injury  Shingrix- declines   Flu vaccine - wants to get when she feels better   Afraid of RSV vaccine    Mammogram-had in July  Self breast exam-no lumps  Personal history of breast cancer Just stopped letrazole    Gyn healthno changes    Colon cancer screening -out aged   Bone health  Dexa - 07/2018 osteopenia  Took letrozole in past  Falls- none recent  Fractures-none recent   (did stub her toe right 5th) is sore  Supplements -vit D  Last vitamin D No results found for: "25OHVITD2", "25OHVITD3", "VD25OH"  Exercise  None - due to pain and illness Cannot walk due to back  No interest in any exercise yet  Not in PT    Mood    05/31/2022    3:04 PM 05/10/2022    4:07 PM 11/28/2021    3:03 PM 11/05/2021   10:32 AM 05/30/2021    1:21 PM  Depression screen PHQ 2/9  Decreased Interest 0 0 0 0 0  Down, Depressed, Hopeless 0 0 0 0 0  PHQ  - 2 Score 0 0 0 0 0   HTN bp is stable today  No cp or palpitations or headaches or edema  No side effects to medicines  BP Readings from Last 3 Encounters:  04/23/23 138/64  04/02/23 (!) 146/62  03/24/23 130/60    Amlodipine 2.5 mg daily  Coreg 12.5 g tid Imdur 60 mg bid Hydralazine 50 mg   Under cardiology care for this with nonischemic cardiomyopathy  Also taking xarelto   Past history of CVA  Lab Results  Component Value Date   NA 142 03/26/2023   K 5.0 03/26/2023   CO2 32 03/26/2023   GLUCOSE 119 (H) 03/26/2023   BUN 24 (H) 03/26/2023   CREATININE 1.73 (H) 03/26/2023   CALCIUM 9.3 03/26/2023   GFR 26.47 (L) 03/26/2023   EGFR 33.0 11/02/2022   GFRNONAA 28 (L) 11/30/2021  Sees nephrology for CKD GFR is down a bit   Sees renal specialist in 2 weeks   Lab Results  Component Value Date   WBC 8.9 03/26/2023   HGB 13.8 03/26/2023   HCT 43.3 03/26/2023   MCV 91.9 03/26/2023   PLT 191.0 03/26/2023     Fatty liver in the past Lab Results  Component Value Date   ALT 23 03/26/2023   AST  24 03/26/2023   ALKPHOS 97 03/26/2023   BILITOT 0.6 03/26/2023     GERD Protonix 40 mg daily   Lab Results  Component Value Date   VITAMINB12 >1537 (H) 03/26/2023     Hyperlipidemia Lab Results  Component Value Date   CHOL 113 03/26/2023   CHOL 100 02/18/2023   CHOL 99 06/12/2020   Lab Results  Component Value Date   HDL 33.00 (L) 03/26/2023   HDL 33 (L) 02/18/2023   HDL 33.20 (L) 06/12/2020   Lab Results  Component Value Date   LDLCALC 48 03/26/2023   LDLCALC 41 02/18/2023   LDLCALC 46 06/10/2019   Lab Results  Component Value Date   TRIG 161.0 (H) 03/26/2023   TRIG 149 02/18/2023   TRIG 209.0 (H) 06/12/2020   Lab Results  Component Value Date   CHOLHDL 3 03/26/2023   CHOLHDL 3.0 02/18/2023   CHOLHDL 3 06/12/2020   Lab Results  Component Value Date   LDLDIRECT 46.0 06/12/2020   LDLDIRECT 77.0 04/22/2016   LDLDIRECT 151.3 03/02/2013    Atorvastatin 80 mg daily   DM2 with neuropathy  Sees endocrinology   Thyroid Lab Results  Component Value Date   TSH 4.43 03/26/2023    Also uri symptoms  Started last Friday  No fever  No wheezing or shortness of breath  No sick contacts  No phlegm  Nasal congestion   Had neg covid and flu tests last time she was here   No over the counter meds    Better after last round of prednisone  Never 100%   Cxr today  DG Chest 2 View  Result Date: 04/23/2023 CLINICAL DATA:  Persistent cough, bronchitis EXAM: CHEST - 2 VIEW COMPARISON:  03/26/2023 FINDINGS: Frontal and lateral views of the chest demonstrates stable pacer/AICD. The cardiac silhouette is unremarkable. No acute airspace disease, effusion, or pneumothorax. The linear opacity within the right mid chest on prior study has resolved in the interim. No acute bony abnormalities. IMPRESSION: 1. No acute intrathoracic process. Electronically Signed   By: Sharlet Salina M.D.   On: 04/23/2023 17:50   DG Chest 2 View  Result Date: 04/02/2023 CLINICAL DATA:  Cough and congestion. EXAM: CHEST - 2 VIEW COMPARISON:  Radiographs 07/30/2022, 07/12/2022 and 11/29/2021. CT 09/26/2014. FINDINGS: Portions of the posterior chest are excluded from the lateral view. Left subclavian and ICD leads appear unchanged. The heart size and mediastinal contours are stable with mild cardiomegaly and aortic atherosclerosis. Interval mildly increased interstitial prominence with linear density in the region of the minor fissure which could reflect fissural thickening or adjacent subsegmental atelectasis. No confluent airspace disease, definite edema or significant pleural effusion. There is no pneumothorax. The bones appear unchanged. There are degenerative changes in the spine associated with a convex left lumbar scoliosis. IMPRESSION: New linear opacity near the minor fissure which may reflect fissural thickening or subsegmental atelectasis. No other  significant changes identified. No edema or confluent airspace disease. Electronically Signed   By: Carey Bullocks M.D.   On: 04/02/2023 14:35     Some dysuria   Patient Active Problem List   Diagnosis Date Noted   Injury of right toe, initial encounter 04/23/2023   Acute bronchitis 04/02/2023   Current use of proton pump inhibitor 03/24/2023   Rectal bleeding 02/17/2023   Senile purpura (HCC) 01/20/2023   Acute cystitis without hematuria 10/23/2022   Stage 3 chronic kidney disease, unspecified whether stage 3a or 3b CKD (HCC) 08/05/2022   Pain  due to onychomycosis of toenails of both feet 04/04/2022   History of breast cancer 06/18/2020   Anemia 06/16/2020   Elevated TSH 06/16/2020   Occipital neuralgia 02/14/2020   Hair loss 11/09/2018   Leg weakness, bilateral 06/29/2018   Dysuria 06/29/2018   Pedal edema 06/22/2018   AICD (automatic cardioverter/defibrillator) present 06/11/2018   GERD (gastroesophageal reflux disease) 06/10/2018   AKI (acute kidney injury) (HCC) 06/10/2018   Chronic back pain 05/23/2017   Chronic systolic (congestive) heart failure (HCC) 02/25/2017   Osteopenia 06/16/2016   Routine general medical examination at a health care facility 04/24/2016   Estrogen deficiency 04/24/2016   Cough 05/31/2014   Stroke, small vessel (HCC) 01/18/2014   Family history of hemochromatosis 01/18/2014   Cerebral thrombosis with cerebral infarction (HCC) 01/14/2014   Expressive aphasia 01/13/2014   Nonischemic cardiomyopathy (HCC) 04/05/2013   Cerumen impaction 06/16/2012   Elevated transaminase level 05/18/2012   Spinal stenosis of lumbar region 12/10/2011   Mixed incontinence urge and stress 12/10/2011   Renal insufficiency 11/04/2011   Goiter 01/14/2011   Fatty liver 08/28/2010   Type II diabetes mellitus with renal manifestations (HCC) 07/09/2010   Hyperlipidemia associated with type 2 diabetes mellitus (HCC) 07/09/2010   Hyperkalemia 07/09/2010   Reflex  sympathetic dystrophy 07/09/2010   Neuropathy 07/09/2010   Essential hypertension 07/09/2010   CARDIOMYOPATHY 07/09/2010   OVERACTIVE BLADDER 07/09/2010   Past Medical History:  Diagnosis Date   Arthritis    "hands" (01/13/2014)   Basal cell carcinoma 01/2014   "bridge of nose"   Cardiac LV ejection fraction >40%    "it was 43 last year" (01/13/2014)   Chronic lower back pain    CKD (chronic kidney disease), stage III (HCC)    acute on chronic stage III/notes 06/10/2018   Colon polyps    Expressive aphasia    "3 times in the last week" (01/13/2014)   GERD (gastroesophageal reflux disease)    Goiter past remote   treated with RI   Hyperlipidemia    Hyperpotassemia    Hypertension    Hypertonicity of bladder    Inflammatory and toxic neuropathy, unspecified    LBBB (left bundle branch block)    Migraine    "stopped many years ago" (01/13/2014)   Mini stroke 01/2014   Other primary cardiomyopathies    Overactive bladder    Presence of combination internal cardiac defibrillator (ICD) and pacemaker    Reflex sympathetic dystrophy, unspecified    Shingles    Shortness of breath    ambulation   Stroke (HCC)    Thyroid disease    Type II diabetes mellitus (HCC)    Ulcer    Urine incontinence    Vertigo    hx of   Past Surgical History:  Procedure Laterality Date   BACK SURGERY     BI-VENTRICULAR PACEMAKER INSERTION (CRT-P)  10/2014   [facility]   BREAST CYST EXCISION Left 1959   CARDIAC CATHETERIZATION  "several"   [place]   CATARACT EXTRACTION W/ INTRAOCULAR LENS IMPLANT Left ~ 2005   several eye injections   CATARACT EXTRACTION W/PHACO Right 05/04/2020   Procedure: CATARACT EXTRACTION PHACO AND INTRAOCULAR LENS PLACEMENT (IOC) RIGHT DIABETIC;  Surgeon: Galen Manila, MD;  Location: [facility] ORS;  Service: Ophthalmology;  Laterality: Right;  [place] 00:58.5 CDE 6.70 Fluid Pack [place] # [id number]   CHOLECYSTECTOMY N/A 06/13/2018   Procedure: LAPAROSCOPIC CHOLECYSTECTOMY;  Surgeon:  Manus Rudd, MD;  Location: [facility] OR;  Service: General;  Laterality: N/A;  COLONOSCOPY  7/12   normal (hx of polyps in past)    CT SCAN  3/12   outside hosp- lung nodule and gallstones   ERCP N/A 06/12/2018   Procedure: ENDOSCOPIC RETROGRADE CHOLANGIOPANCREATOGRAPHY (ERCP);  Surgeon: Jeani Hawking, MD;  Location[facility] ENDOSCOPY;  Service: Endoscopy;  Laterality: N/A;   FINGER SURGERY Left 1959 & 1976   "knot on index"   KIDNEY DONATION Left 1989   LUMBAR LAMINECTOMY/DECOMPRESSION MICRODISCECTOMY  1999   LUMBAR LAMINECTOMY/DECOMPRESSION MICRODISCECTOMY  01/31/2012   Procedure: LUMBAR LAMINECTOMY/DECOMPRESSION MICRODISCECTOMY 2 LEVELS;  Surgeon: Tia Alert, MD;  Location: [facility] NEURO ORS;  Service: Neurosurgery;  Laterality: N/A;  Thoracic twelve-lumbar one, lumbar one-two laminectomy    POSTERIOR LAMINECTOMY / DECOMPRESSION CERVICAL SPINE  1999   REMOVAL OF STONES  06/12/2018   Procedure: REMOVAL OF STONES;  Surgeon: Jeani Hawking, MD;  Location: [facility] ENDOSCOPY;  Service: Endoscopy;;   SPHINCTEROTOMY  06/12/2018   Procedure: Dennison Mascot;  Surgeon: Jeani Hawking, MD;  Location: [facility] ENDOSCOPY;  Service: Endoscopy;;   TUBAL LIGATION  1976   UPPER GASTROINTESTINAL ENDOSCOPY     Social History   Tobacco Use   Smoking status: Never   Smokeless tobacco: Never  Vaping Use   Vaping status: Never Used  Substance Use Topics   Alcohol use: No    Alcohol/week: 0.0 standard drinks of alcohol   Drug use: No   Family History  Problem Relation Age of Onset   Arthritis Mother    Cancer Mother        uterine and mouth   Hyperlipidemia Mother    Stroke Mother    Hypertension Mother    Alcohol abuse Father    Diabetes Father    Cancer Sister        breast   Diabetes Sister    Hyperlipidemia Sister    Heart disease Sister    Hypertension Sister    Diabetes Sister    Cancer Brother        lung cancer   Kidney disease Brother    Diabetes Brother    Hyperlipidemia Brother    Kidney failure  Brother    Diabetes Daughter    Heart attack Daughter    Addison's disease Other    Allergies  Allergen Reactions   Ace Inhibitors Swelling    REACTION: tongue swelling   Lisinopril Swelling    Swelling of tongue   Insulin Detemir Itching   Nsaids Other (See Comments)    Renal issues   Codeine Nausea Only   Hydrochlorothiazide Other (See Comments)    Dizziness/funny feeling   Tylenol [Acetaminophen] Other (See Comments)    Fatty liver - use with caution   Current Outpatient Medications on File Prior to Visit  Medication Sig Dispense Refill   albuterol (VENTOLIN HFA) 108 (90 Base) MCG/ACT inhaler Inhale 2 puffs into the lungs every 6 (six) hours as needed for wheezing or shortness of breath (cough). 8 g 1   amLODipine (NORVASC) 2.5 MG tablet Take 2.5 mg by mouth at bedtime.     atorvastatin (LIPITOR) 80 MG tablet Take 1 tablet (80 mg total) by mouth daily. 90 tablet 3   benzonatate (TESSALON) 200 MG capsule Take 1 capsule (200 mg total) by mouth 3 (three) times daily as needed for cough. 40 capsule 1   BIOTIN 5000 PO Take 5,000 mg by mouth daily.     carvedilol (COREG) 12.5 MG tablet Take 1 tablet (12.5 mg total) by mouth 2 (two) times daily with  a meal. 180 tablet 3   cetirizine (ZYRTEC) 10 MG tablet Take 10 mg by mouth daily.     clotrimazole-betamethasone (LOTRISONE) cream Apply 1 application topically as needed (groin).      Continuous Glucose Sensor (FREESTYLE LIBRE 3 SENSOR) MISC Use to check glucose continuously 6 each 3   dapagliflozin propanediol (FARXIGA) 5 MG TABS tablet Take 5 mg by mouth daily.     famotidine (PEPCID) 20 MG tablet Take 1 tablet (20 mg total) by mouth daily. 90 tablet 0   gabapentin (NEURONTIN) 300 MG capsule Take 1 capsule (300 mg total) by mouth 3 (three) times daily. 270 capsule 3   [name] 75 MG TABS Take 1 tablet by mouth once daily 90 tablet 0   hydrALAZINE (APRESOLINE) 50 MG tablet Take 1 tablet (50 mg total) by mouth 3 (three) times daily. 270  tablet 3   hydrocortisone 2.5 % cream Apply topically 3 (three) times daily as needed. 28 g 3   insulin degludec (TRESIBA) 100 UNIT/ML FlexTouch Pen Inject 26 Units into the skin at bedtime.     isosorbide mononitrate (IMDUR) 60 MG 24 hr tablet Take 1 tablet (60 mg total) by mouth in the morning and at bedtime. 180 tablet 3   LORazepam (ATIVAN) 0.5 MG tablet Take 1 tablet by mouth every 8 (eight) hours as needed.     nystatin cream (MYCOSTATIN) Apply 1 application topically 2 (two) times daily as needed (irritation.).      pantoprazole (PROTONIX) 40 MG tablet Take 1 tablet by mouth once daily 90 tablet 1   promethazine-dextromethorphan (PROMETHAZINE-DM) 6.25-15 MG/5ML syrup Take 5 mLs by mouth at bedtime as needed for cough. Caution of sedation 118 mL 0   Respiratory Therapy Supplies (NEBULIZER/TUBING/MOUTHPIECE) KIT 1 each by Does not apply route 3 (three) times daily as needed. 1 kit 0   Rivaroxaban (XARELTO) 15 MG TABS tablet Take 15 mg by mouth daily with supper.     Semaglutide, 2 MG/DOSE, (OZEMPIC, 2 MG/DOSE,) 8 MG/3ML SOPN Inject 2 mg into the skin once a week.     traMADol (ULTRAM) 50 MG tablet Take 1-2 tablets (50-100 mg total) by mouth every 6 (six) hours as needed for severe pain. 60 tablet 0   vitamin B-12 (CYANOCOBALAMIN) 1000 MCG tablet Take 500 mcg by mouth daily.     letrozole (FEMARA) 2.5 MG tablet Take 2.5 mg by mouth daily. (Patient not taking: Reported on 04/23/2023)     No current facility-administered medications on file prior to visit.    Review of Systems  Constitutional:  Positive for fatigue. Negative for activity change, appetite change, fever and unexpected weight change.  HENT:  Negative for congestion, ear pain, rhinorrhea, sinus pressure and sore throat.   Eyes:  Negative for pain, redness and visual disturbance.  Respiratory:  Positive for cough. Negative for shortness of breath, wheezing and stridor.   Cardiovascular:  Negative for chest pain and palpitations.   Gastrointestinal:  Negative for abdominal pain, blood in stool, constipation and diarrhea.  Endocrine: Negative for polydipsia and polyuria.  Genitourinary:  Positive for dysuria. Negative for frequency and urgency.  Musculoskeletal:  Positive for arthralgias, back pain and myalgias.       Stubbed toe   Skin:  Negative for pallor and rash.  Allergic/Immunologic: Negative for environmental allergies.  Neurological:  Negative for dizziness, syncope and headaches.  Hematological:  Negative for adenopathy. Does not bruise/bleed easily.  Psychiatric/Behavioral:  Negative for decreased concentration and dysphoric mood. The patient is  not nervous/anxious.        Objective:   Physical Exam Constitutional:      General: She is not in acute distress.    Appearance: Normal appearance. She is well-developed and normal weight. She is not ill-appearing or diaphoretic.  HENT:     Head: Normocephalic and atraumatic.     Right Ear: Tympanic membrane, ear canal and external ear normal.     Left Ear: Tympanic membrane, ear canal and external ear normal.     Nose: Nose normal. No congestion.     Mouth/Throat:     Mouth: Mucous membranes are moist.     Pharynx: Oropharynx is clear. No posterior oropharyngeal erythema.  Eyes:     General: No scleral icterus.    Extraocular Movements: Extraocular movements intact.     Conjunctiva/sclera: Conjunctivae normal.     Pupils: Pupils are equal, round, and reactive to light.  Neck:     Thyroid: No thyromegaly.     Vascular: No carotid bruit or JVD.  Cardiovascular:     Rate and Rhythm: Normal rate and regular rhythm.     Pulses: Normal pulses.     Heart sounds: Normal heart sounds.     No gallop.  Pulmonary:     Effort: Pulmonary effort is normal. No respiratory distress.     Breath sounds: Normal breath sounds. No stridor. No wheezing, rhonchi or rales.     Comments: Good air exch  Some upper airway sounds  Chest:     Chest wall: No tenderness.   Abdominal:     General: Bowel sounds are normal. There is no distension or abdominal bruit.     Palpations: Abdomen is soft. There is no mass.     Tenderness: There is no abdominal tenderness.     Hernia: No hernia is present.  Genitourinary:    Comments: Breast exam: No mass, nodules, thickening, tenderness, bulging, retraction, inflamation, nipple discharge or skin changes noted.  No axillary or clavicular LA.     Musculoskeletal:        General: No tenderness. Normal range of motion.     Cervical back: Normal range of motion and neck supple. No rigidity. No muscular tenderness.     Right lower leg: No edema.     Left lower leg: No edema.     Comments: No kyphosis   Right 5rh toe contusion   Lymphadenopathy:     Cervical: No cervical adenopathy.  Skin:    General: Skin is warm and dry.     Coloration: Skin is not pale.     Findings: No erythema or rash.     Comments: Solar lentigines diffusely Solar aging Sks   Neurological:     Mental Status: She is alert. Mental status is at baseline.     Cranial Nerves: No cranial nerve deficit.     Motor: No abnormal muscle tone.     Coordination: Coordination normal.     Gait: Gait normal.     Deep Tendon Reflexes: Reflexes are normal and symmetric. Reflexes normal.  Psychiatric:        Mood and Affect: Mood normal.        Cognition and Memory: Cognition and memory normal.           Assessment & Plan:   Problem List Items Addressed This Visit       Cardiovascular and Mediastinum   Essential hypertension    Bp is improved  F/u in a week to re  check No change in medicines   BP: 138/64   Amlodipine 2.5 mg daily  Coreg 12.5 g tid Imdur 60 mg bid Hydralazine 50 mg daily    Under cardiology care         Respiratory   Acute bronchitis    This improved and then cough returned  No longer wheezing 02 sat 98%  Reassuring exam No infiltrate on cxr  Suspect viral  Discussed symptoms care Update if not starting to  improve in a week or if worsening  Call back and Er precautions noted in detail today        Relevant Orders   DG Chest 2 View (Completed)   POC COVID-19 (Completed)     Endocrine   Type II diabetes mellitus with renal manifestations (HCC)   Hyperlipidemia associated with type 2 diabetes mellitus (HCC)    Disc goals for lipids and reasons to control them Rev last labs with pt Rev low sat fat diet in detail LDL 48 Plan to continue atorvastatin 80 mg daily  Also cardiology care         Musculoskeletal and Integument   Osteopenia    Dexa ordered  Last 2020 Discussed fall prevention, supplements and exercise for bone density  No recent falls  Toe injury- unsure if fracture          Genitourinary   Stage 3 chronic kidney disease, unspecified whether stage 3a or 3b CKD (HCC)     Other   Elevated TSH (Chronic)    Today TSH and free T4 is normal Lab Results  Component Value Date   TSH 4.43 03/26/2023         Routine general medical examination at a health care facility - Primary    Reviewed health habits including diet and exercise and skin cancer prevention Reviewed appropriate screening tests for age  Also reviewed health mt list, fam hx and immunization status , as well as social and family history   See HPI Labs reviewed and ordered Eye exam planned Monday Declines shigrix  Declines flu shot until she is over her cold Declines RSV vaccine due to fear of it  Mammogram / [facility] in July  Discussed fall prevention, supplements and exercise for bone density  Dexa ordered  PHQ 0      Injury of right toe, initial encounter    Bruised right 5th toe from recent trauma  Encouraged to keep clean with soap and water Elevate Buddy tape if needed  Cold compress if needed  Follow up if no improvement  Limited time in visit today- can xr in future if needed       Estrogen deficiency    Dexa ordered       Relevant Orders   DG Bone Density   Dysuria    Urinalysis  with trace wbc  Culture pending  Has had a few uti lately       Relevant Orders   POCT Urinalysis Dipstick (Automated) (Completed)   Urine Culture   Current use of proton pump inhibitor    Lab Results  Component Value Date   [id number] >4782 (H) 03/26/2023   Instructed to hold B12 2 wk then re start lower dose  Encouraged vit D intake        Cough    Worsened since weekend  Neg covid test -reassuring Exam is reassuring  Cxr-no infiltrate Suspect viral uri  Update if not starting to improve in a week or if worsening  Relevant Orders   DG Chest 2 View (Completed)   POC COVID-19 (Completed)   Other Visit Diagnoses     Abnormal urinalysis       Relevant Orders   Urine Culture

## 2023-04-23 ENCOUNTER — Ambulatory Visit (INDEPENDENT_AMBULATORY_CARE_PROVIDER_SITE_OTHER)
Admission: RE | Admit: 2023-04-23 | Discharge: 2023-04-23 | Disposition: A | Discharge status: Home / Self Care | Payer: Medicare HMO | Source: Ambulatory Visit | Attending: Family Medicine | Admitting: Family Medicine

## 2023-04-23 ENCOUNTER — Encounter: Payer: Self-pay | Admitting: Family Medicine

## 2023-04-23 ENCOUNTER — Ambulatory Visit: Payer: Medicare HMO | Admitting: Family Medicine

## 2023-04-23 VITALS — BP 138/64 | HR 74 | Temp 97.7°F | Ht 60.0 in | Wt 147.0 lb

## 2023-04-23 DIAGNOSIS — E785 Hyperlipidemia, unspecified: Secondary | ICD-10-CM | POA: Diagnosis not present

## 2023-04-23 DIAGNOSIS — R8290 Unspecified abnormal findings in urine: Secondary | ICD-10-CM

## 2023-04-23 DIAGNOSIS — R300 Dysuria: Secondary | ICD-10-CM | POA: Diagnosis not present

## 2023-04-23 DIAGNOSIS — R7989 Other specified abnormal findings of blood chemistry: Secondary | ICD-10-CM

## 2023-04-23 DIAGNOSIS — M85839 Other specified disorders of bone density and structure, unspecified forearm: Secondary | ICD-10-CM | POA: Diagnosis not present

## 2023-04-23 DIAGNOSIS — I10 Essential (primary) hypertension: Secondary | ICD-10-CM | POA: Diagnosis not present

## 2023-04-23 DIAGNOSIS — E2839 Other primary ovarian failure: Secondary | ICD-10-CM

## 2023-04-23 DIAGNOSIS — J209 Acute bronchitis, unspecified: Secondary | ICD-10-CM

## 2023-04-23 DIAGNOSIS — Z0000 Encounter for general adult medical examination without abnormal findings: Secondary | ICD-10-CM | POA: Diagnosis not present

## 2023-04-23 DIAGNOSIS — Z7984 Long term (current) use of oral hypoglycemic drugs: Secondary | ICD-10-CM

## 2023-04-23 DIAGNOSIS — S90121A Contusion of right lesser toe(s) without damage to nail, initial encounter: Secondary | ICD-10-CM

## 2023-04-23 DIAGNOSIS — N183 Chronic kidney disease, stage 3 unspecified: Secondary | ICD-10-CM | POA: Diagnosis not present

## 2023-04-23 DIAGNOSIS — R051 Acute cough: Secondary | ICD-10-CM

## 2023-04-23 DIAGNOSIS — R053 Chronic cough: Secondary | ICD-10-CM | POA: Diagnosis not present

## 2023-04-23 DIAGNOSIS — Z95810 Presence of automatic (implantable) cardiac defibrillator: Secondary | ICD-10-CM | POA: Diagnosis not present

## 2023-04-23 DIAGNOSIS — Z79899 Other long term (current) drug therapy: Secondary | ICD-10-CM

## 2023-04-23 DIAGNOSIS — E1129 Type 2 diabetes mellitus with other diabetic kidney complication: Secondary | ICD-10-CM | POA: Diagnosis not present

## 2023-04-23 DIAGNOSIS — Z794 Long term (current) use of insulin: Secondary | ICD-10-CM | POA: Diagnosis not present

## 2023-04-23 DIAGNOSIS — J40 Bronchitis, not specified as acute or chronic: Secondary | ICD-10-CM | POA: Diagnosis not present

## 2023-04-23 DIAGNOSIS — E1169 Type 2 diabetes mellitus with other specified complication: Secondary | ICD-10-CM | POA: Diagnosis not present

## 2023-04-23 DIAGNOSIS — S99921A Unspecified injury of right foot, initial encounter: Secondary | ICD-10-CM | POA: Insufficient documentation

## 2023-04-23 LAB — POC URINALSYSI DIPSTICK (AUTOMATED)
Bilirubin, UA: NEGATIVE
Blood, UA: NEGATIVE
Glucose, UA: POSITIVE — AB
Ketones, UA: NEGATIVE
Nitrite, UA: NEGATIVE
Protein, UA: POSITIVE — AB
Spec Grav, UA: 1.01 (ref 1.010–1.025)
Urobilinogen, UA: 0.2 U/dL
pH, UA: 6.5 (ref 5.0–8.0)

## 2023-04-23 LAB — POC COVID19 BINAXNOW: SARS Coronavirus 2 Ag: NEGATIVE

## 2023-04-23 NOTE — Assessment & Plan Note (Addendum)
This improved and then cough returned  No longer wheezing 02 sat 98%  Reassuring exam No infiltrate on cxr  Suspect viral  Discussed symptoms care Update if not starting to improve in a week or if worsening  Call back and Er precautions noted in detail today

## 2023-04-23 NOTE — Assessment & Plan Note (Signed)
Lab Results  Component Value Date   VITAMINB12 >1537 (H) 03/26/2023   Instructed to hold B12 2 wk then re start lower dose  Encouraged vit D intake

## 2023-04-23 NOTE — Assessment & Plan Note (Signed)
Dexa ordered

## 2023-04-23 NOTE — Assessment & Plan Note (Signed)
Bp is improved  F/u in a week to re check No change in medicines   BP: 138/64   Amlodipine 2.5 mg daily  Coreg 12.5 g tid Imdur 60 mg bid Hydralazine 50 mg daily    Under cardiology care

## 2023-04-23 NOTE — Assessment & Plan Note (Signed)
Today TSH and free T4 is normal Lab Results  Component Value Date   TSH 4.43 03/26/2023

## 2023-04-23 NOTE — Assessment & Plan Note (Signed)
Urinalysis with trace wbc  Culture pending  Has had a few uti lately

## 2023-04-23 NOTE — Patient Instructions (Addendum)
Get some vitamin D3 over the counter and take 2000 international units daily   Urinalysis today   Chest xray today   Hold your B12 for 2 weeks  Then go back to 500 mcg daily instead of 1000   If the left leg soreness persists or worsens come back  If the toe worsens come back      You have an order for:  []   2D Mammogram  []   3D Mammogram  [x]   Bone Density     Please call for appointment:   []   [facility]  [address] [place] [address]  [phone]  [x]   [facility])   [address]  [place], [place] [address]  [phone]  []   [facility]      [address] [place], [place]        [phone]         []   [facility]  [address] [place], [place]  [phone]  []  [facility]   [address]   [place], [place] [address]  [phone]  [facility]  [address] [place], [place] [address] [phone]    Make sure to wear two piece clothing  No lotions powders or deodorants the day of the appointment Make sure to bring picture ID and insurance card.  Bring list of medications you are currently taking including any supplements.   Schedule your screening mammogram through MyChart!   Select [facility] imaging sites can now be scheduled through MyChart.  Log into your MyChart account.  Go to 'Visit' (or 'Appointments' if  on mobile App) --> Schedule an  Appointment  Under 'Select a Reason for Visit' choose the Mammogram  Screening option.  Complete the pre-visit questions  and select the time and place that  best fits your schedule

## 2023-04-23 NOTE — Assessment & Plan Note (Signed)
Endocrinology care currently  Foot exam notes injured right 5th toe   Eye exam planned monday

## 2023-04-23 NOTE — Assessment & Plan Note (Addendum)
Bruised right 5th toe from recent trauma  Encouraged to keep clean with soap and water Elevate Buddy tape if needed  Cold compress if needed  Follow up if no improvement  Limited time in visit today- can xr in future if needed

## 2023-04-23 NOTE — Assessment & Plan Note (Signed)
Dexa ordered  Last 2020 Discussed fall prevention, supplements and exercise for bone density  No recent falls  Toe injury- unsure if fracture

## 2023-04-23 NOTE — Assessment & Plan Note (Signed)
Disc goals for lipids and reasons to control them Rev last labs with pt Rev low sat fat diet in detail LDL 48 Plan to continue atorvastatin 80 mg daily  Also cardiology care

## 2023-04-23 NOTE — Assessment & Plan Note (Signed)
GFR last 26.47 Has had some utis  Dysuria today- culture pending Protein and glucose in urine  For nephrology follow up soon

## 2023-04-23 NOTE — Assessment & Plan Note (Signed)
Worsened since weekend  Neg covid test -reassuring Exam is reassuring  Cxr-no infiltrate Suspect viral uri  Update if not starting to improve in a week or if worsening

## 2023-04-23 NOTE — Assessment & Plan Note (Addendum)
Reviewed health habits including diet and exercise and skin cancer prevention Reviewed appropriate screening tests for age  Also reviewed health mt list, fam hx and immunization status , as well as social and family history   See HPI Labs reviewed and ordered Eye exam planned Monday Declines shigrix  Declines flu shot until she is over her cold Declines RSV vaccine due to fear of it  Mammogram / [facility] in July  Discussed fall prevention, supplements and exercise for bone density  Dexa ordered  PHQ 0 Health Maintenance  Topic Date Due   DEXA scan (bone density measurement)  07/21/2020   Complete foot exam   04/05/2023   Eye exam for diabetics  05/23/2023*   Zoster (Shingles) Vaccine (1 of 2) 07/03/2023*   Flu Shot  09/01/2023*   COVID-19 Vaccine (4 - 2023-24 season) 04/17/2024*   Hemoglobin A1C  06/21/2023   Medicare Annual Wellness Visit  08/01/2023   Mammogram  12/16/2023   Pneumonia Vaccine  Completed   HPV Vaccine  Aged Out   DTaP/Tdap/Td vaccine  Discontinued  *Topic was postponed. The date shown is not the original due date.

## 2023-04-24 LAB — URINE CULTURE
MICRO NUMBER:: 15757001
SPECIMEN QUALITY:: ADEQUATE

## 2023-04-28 DIAGNOSIS — H34832 Tributary (branch) retinal vein occlusion, left eye, with macular edema: Secondary | ICD-10-CM | POA: Diagnosis not present

## 2023-04-28 DIAGNOSIS — M3501 Sicca syndrome with keratoconjunctivitis: Secondary | ICD-10-CM | POA: Diagnosis not present

## 2023-04-28 DIAGNOSIS — E11319 Type 2 diabetes mellitus with unspecified diabetic retinopathy without macular edema: Secondary | ICD-10-CM | POA: Diagnosis not present

## 2023-04-28 LAB — HM DIABETES EYE EXAM

## 2023-04-29 ENCOUNTER — Other Ambulatory Visit: Payer: Self-pay | Admitting: Family Medicine

## 2023-04-29 NOTE — Telephone Encounter (Signed)
CPE was on 04/23/23,  Last filled on 04/03/22 #270 caps/ 3 refills

## 2023-05-05 DIAGNOSIS — R809 Proteinuria, unspecified: Secondary | ICD-10-CM | POA: Diagnosis not present

## 2023-05-05 DIAGNOSIS — B379 Candidiasis, unspecified: Secondary | ICD-10-CM | POA: Diagnosis not present

## 2023-05-05 DIAGNOSIS — I129 Hypertensive chronic kidney disease with stage 1 through stage 4 chronic kidney disease, or unspecified chronic kidney disease: Secondary | ICD-10-CM | POA: Diagnosis not present

## 2023-05-05 DIAGNOSIS — E1122 Type 2 diabetes mellitus with diabetic chronic kidney disease: Secondary | ICD-10-CM | POA: Diagnosis not present

## 2023-05-05 DIAGNOSIS — K219 Gastro-esophageal reflux disease without esophagitis: Secondary | ICD-10-CM | POA: Diagnosis not present

## 2023-05-05 DIAGNOSIS — E875 Hyperkalemia: Secondary | ICD-10-CM | POA: Diagnosis not present

## 2023-05-05 DIAGNOSIS — N184 Chronic kidney disease, stage 4 (severe): Secondary | ICD-10-CM | POA: Diagnosis not present

## 2023-05-05 DIAGNOSIS — D649 Anemia, unspecified: Secondary | ICD-10-CM | POA: Diagnosis not present

## 2023-05-05 DIAGNOSIS — N3281 Overactive bladder: Secondary | ICD-10-CM | POA: Diagnosis not present

## 2023-05-07 LAB — LAB REPORT - SCANNED
Albumin, Urine POC: 259.3
Albumin/Creatinine Ratio, Urine, POC: 290
Creatinine, POC: 89.4 mg/dL

## 2023-05-14 ENCOUNTER — Telehealth: Payer: Self-pay

## 2023-05-14 ENCOUNTER — Encounter: Payer: Self-pay | Admitting: Pharmacist

## 2023-05-14 NOTE — Telephone Encounter (Signed)
PAP: Application for [name] has been submitted to PAP Companies: AZ&ME, via fax  PLEASE BE ADVISED I HAVE SUBMITTED PT AZ&ME APPLICATION FOR PT RENEWAL FOR 2025.  BUT PT WILL NEED RX SENT TO [facility].    PLEASE HAVE PROVIDER TO SEND 90 RX TO [facility]

## 2023-05-14 NOTE — Progress Notes (Signed)
Pharmacy Medication Assistance Program Note    05/14/2023  Patient ID: Morgan Hubbard, female   DOB: [birth year], 88 y.o.   MRN: [id number]     05/14/2023  Outreach Medication One  Manufacturer Medication One [employer] Drugs Ozempic  Dose of Ozempic 2MG   Type of Radiographer, therapeutic Assistance  Name of Prescriber TOWER MARNE  Date Application Received From Patient 05/14/2023  Application Items Received From Patient Application  Method Application Sent to Manufacturer Online       Signature    05/14/2023  Patient ID: Morgan Hubbard, female  DOB: 12/15/1934, 88 y.o.  MRN:  [id number]     05/14/2023  Outreach Medication Two  Type of Assistance Manufacturer Assistance  Date Application Sent to Patient 05/14/2023  Application Items Requested Application  Name of Prescriber [name]       Signature   05/14/2023  Patient ID: Morgan Hubbard, female  DOB: August 12, 1934, 88 y.o.  MRN:  [id number]     05/14/2023  Outreach Medication Three  Initial Outreach Date (Medication Three) 05/14/2023  Manufacturer Medication Three [employer]  [employer] Drugs Farxiga  Dose of Farxiga 10MG   Type of Sport and exercise psychologist  Name of Prescriber [name]       SIGNATURE

## 2023-05-14 NOTE — Telephone Encounter (Signed)
In IN box 

## 2023-05-14 NOTE — Telephone Encounter (Signed)
Form printed and placed in PCP's inbox.

## 2023-05-14 NOTE — Telephone Encounter (Signed)
Form given back to Morgan Hubbard

## 2023-05-14 NOTE — Progress Notes (Addendum)
Manufacturer Assistance Program (MAP) Application   Manufacturer: [employer] (AZ&Me) Medication(s): Jardiance 5 mg  Patient Portion of Application:  12/11: Mailed to patient's home from CPhT Medication Advocate team.  Provider Portion of Application:  12/11: Provider signed physical paper application prescription page.  Prescription(s): Included in MAP application. 12/11: Provider pages signed and faxed to pharmacy technician team fax: [phone]  Application Status:    Managed by [facility] Medication Advocate Team   *[facility] team, Please Addend/update this note as the "Next Steps" are completed at our office.*

## 2023-05-14 NOTE — Telephone Encounter (Signed)
PAP: Application for Ozempic Evaristo Bury has been submitted to PAP Companies: [employer], Animal nutritionist BE ADVISED SUBMITTED PT PAGES VIA ONLINE AND FAXED PROVIDER PAGES TO OFFICE FOR PROVIDER TO GET SIGNATURE.

## 2023-05-15 NOTE — Telephone Encounter (Signed)
Signed and in IN box 

## 2023-05-15 NOTE — Telephone Encounter (Signed)
Form faxed back to [facility], per fax cover sheet

## 2023-05-16 NOTE — Telephone Encounter (Signed)
PAP: Patient has been denied for pt assistance by PAP Companies: AZ&ME due to patient above income threshold for program DENIED DUE TO INCOME BEING OVER THE 300%FPL FOR 2 PPL IN THE HOUSEHOLD INCOME THRESHOLD FOR 2 PPL IS $61,320  LEFT PT VM TO CALL BACK

## 2023-05-16 NOTE — Telephone Encounter (Signed)
PAP: Application for [name] has been submitted to PAP Companies: AZ&ME, via fax     PLEASE BE ADVISED I HAVE SUBMITTED PROVIDER PAGES TO COMPANY

## 2023-05-16 NOTE — Telephone Encounter (Signed)
PAP: Application for Ozempic Morgan Hubbard has been submitted to PAP Companies: [employer], via fax   PLEASE BE ADVISE SUBMITTED PROVIDER PAGES TO COMPANY FOR PROCESSING

## 2023-05-20 NOTE — Telephone Encounter (Signed)
PAP: Patient assistance application for Ozempic and Evaristo Bury has been approved by PAP Companies: [employer] from 05/19/2023 to 07/04/2023. Medication should be delivered to PAP Delivery: Provider's office For further shipping updates, please contact [employer] at [phone] Pt ID is: [id number]  PLEASE BE ADVISED APPROVAL LETTER HAS BEEN SCANNED INTO MEDIA OF CHART

## 2023-05-22 DIAGNOSIS — I428 Other cardiomyopathies: Secondary | ICD-10-CM | POA: Diagnosis not present

## 2023-05-22 DIAGNOSIS — E785 Hyperlipidemia, unspecified: Secondary | ICD-10-CM | POA: Diagnosis not present

## 2023-05-22 DIAGNOSIS — Z95810 Presence of automatic (implantable) cardiac defibrillator: Secondary | ICD-10-CM | POA: Diagnosis not present

## 2023-05-22 DIAGNOSIS — Z4502 Encounter for adjustment and management of automatic implantable cardiac defibrillator: Secondary | ICD-10-CM | POA: Diagnosis not present

## 2023-05-22 DIAGNOSIS — I10 Essential (primary) hypertension: Secondary | ICD-10-CM | POA: Diagnosis not present

## 2023-06-05 ENCOUNTER — Other Ambulatory Visit: Payer: Self-pay | Admitting: Pharmacist

## 2023-06-05 NOTE — Progress Notes (Signed)
 Called patient to touch base regarding denial for AZ&Me 2025 re-enrollment.   As previously documented, she already spoke with [employer]Me who explained to her the reason for her denial being income >300% FPL.   Given approval last year, patient and I reviewed her combined income.  Patient lives in a household of 2 with her husband. They both receive SSI though her husband also receives income from a pension plan. She reports that their combined annual income is ~$73,000.   ________________________________________________________________________________  We discussed Medicare changes for 2025 and general medicare D structure.  Patient confirms she will plan to call [employer] to see what her medication deductible is this year.  Her brand-name medications (Xarelto) copay should go back to her usual copay after initial deductible is met.  Also suspect that Farxiga Cindi would be similar copay to Xarelto. Assisted her husband (also a patient of [facility]) with [employer] grant enrollment which will save them $200/month. May help with affordability of Farxiga  through insurance this year.

## 2023-06-06 DIAGNOSIS — E1169 Type 2 diabetes mellitus with other specified complication: Secondary | ICD-10-CM | POA: Diagnosis not present

## 2023-06-06 DIAGNOSIS — I428 Other cardiomyopathies: Secondary | ICD-10-CM | POA: Diagnosis not present

## 2023-06-06 DIAGNOSIS — I10 Essential (primary) hypertension: Secondary | ICD-10-CM | POA: Diagnosis not present

## 2023-06-06 DIAGNOSIS — Z4502 Encounter for adjustment and management of automatic implantable cardiac defibrillator: Secondary | ICD-10-CM | POA: Diagnosis not present

## 2023-06-06 DIAGNOSIS — Z95810 Presence of automatic (implantable) cardiac defibrillator: Secondary | ICD-10-CM | POA: Diagnosis not present

## 2023-06-06 DIAGNOSIS — I471 Supraventricular tachycardia, unspecified: Secondary | ICD-10-CM | POA: Diagnosis not present

## 2023-06-06 DIAGNOSIS — E785 Hyperlipidemia, unspecified: Secondary | ICD-10-CM | POA: Diagnosis not present

## 2023-06-06 NOTE — Progress Notes (Signed)
 Primary Electrophysiologist: Morgan Repress, MD  Primary Care Provider: Randeen Laine Caldron, MD [address] [place] [address]   Patient Identification: Morgan Hubbard is a 89 y.o. female with a history of   non ischemic cardiomyopathy diagnosed in 2011 with fluctuating EF.   She was started on guidelines basedmedical therapy at that time.  Nuclear stress testing in 06/30/14 EF 25% with in [facility] a class III symptoms.  She is status post CRT-D 10/17/2014 with follow-up EF 40%.  She is here for follow-up of her CRT-D.  Interval History: Morgan Hubbard is here for continued management of her CRT-D. she has not experienced a shock and denies dizziness or syncope. she denies fever, chills, drainage, edema, redness, or pain to device site.  She reports an issue with hemorrhoids and bleeding on xarelto but fortunately this resolved with ointment.  Her main struggle is peripheral neuropathy.  Is now on Ozempic for her diabetes over the last couple of months.   Pertinent details of the medical, social and family history were reviewed and are unchanged except as noted.  ROS: Review of Systems  Respiratory:  Positive for shortness of breath.   Genitourinary:  Positive for frequency.       Review of systems otherwise negative except as indicated below in the interval history.  Problem List:  Patient Active Problem List  Diagnosis  . Nonischemic cardiomyopathy, EF  25% by nuclear; improved with BiV ICD to 40%  . Hypertension  . Hyperlipidemia  . S/p nephrectomy  . Diabetes mellitus type 2, uncomplicated (CMS/HHS-HCC)  . Biventricular implantable cardioverter-defibrillator in situ  . Malignant neoplasm of lower-inner quadrant of right breast of female, estrogen receptor positive (CMS/HHS-HCC)  . Uncontrolled type 2 diabetes mellitus with microalbuminuria, without long-term current use of insulin   . Negative (normal) Genetic Testing  . Negative (normal) Genetic Testing  . Fitting or adjustment of  automatic implantable cardioverter-defibrillator  . Type 2 diabetes mellitus with hyperlipidemia  (CMS/HHS-HCC)  . Type 2 diabetes mellitus with stage 3a chronic kidney disease, without long-term current use of insulin  (CMS/HHS-HCC)  . Type 2 diabetes mellitus with proteinuria and stage 3 CKD  . Chronic bilateral low back pain without sciatica  . Mechanical complication of automatic implantable cardiac defibrillator  . CKD (chronic kidney disease) stage 3  . Adult failure to thrive  . Acute kidney injury (CMS-HCC)     Medications: Current Outpatient Medications  Medication Sig Dispense Refill  . amLODIPine  (NORVASC ) 2.5 MG tablet Take 2.5 mg by mouth once daily    . atorvastatin  (LIPITOR ) 80 MG tablet Take 1 tablet (80 mg total) by mouth once daily 30 tablet 0  . carvediloL  (COREG ) 12.5 MG tablet Take 12.5 mg by mouth 2 (two) times daily with meals    . cetirizine (ZYRTEC) 10 MG tablet Take 10 mg by mouth once daily    . cyanocobalamin  (VITAMIN B12) 1000 MCG tablet Take 1,000 mcg by mouth once daily       . dapagliflozin  propanediol (FARXIGA ) 5 mg Tab tablet Take 1 tablet (5 mg total) by mouth once daily 90 tablet 3  . famotidine  (PEPCID ) 20 MG tablet Take 1 tablet by mouth 2 (two) times daily    . gabapentin  (NEURONTIN ) 300 MG capsule Take 1 capsule (300 mg total) by mouth 3 (three) times daily 90 capsule 0  . [name]  75 mg Tab Take 1 tablet by mouth once daily    . hydrALAZINE  (APRESOLINE ) 50 MG tablet Take 1  tablet (50 mg total) by mouth 2 (two) times daily (Patient taking differently: Take 50 mg by mouth 3 (three) times daily) 60 tablet 0  . insulin  DEGLUDEC (TRESIBA FLEXTOUCH U-100) pen injector (concentration 100 units/mL) Inject 26 Units subcutaneously at bedtime (Patient taking differently: Inject 26 Units subcutaneously every morning) 15 mL 5  . isosorbide  mononitrate (IMDUR ) 60 MG ER tablet Take 1 tablet (60 mg total) by mouth once daily 90 tablet 0  . LORazepam  (ATIVAN ) 0.5 MG  tablet take 1 tablet by mouth every 8 hours as needed for anxiety 10 tablet 0  . nystatin (MYCOSTATIN) 100,000 unit/gram cream     . rivaroxaban (XARELTO) 15 mg tablet Take 1 tablet (15 mg total) by mouth daily with dinner for 30 days 30 tablet 11  . semaglutide (OZEMPIC) 2 mg/dose (8 mg/3 mL) pen injector Inject 0.75 mLs (2 mg total) subcutaneously once a week    . traMADoL  (ULTRAM ) 50 mg tablet Take 50 mg by mouth every 8 (eight) hours as needed    . apixaban (ELIQUIS) 2.5 mg tablet Take 1 tablet (2.5 mg total) by mouth every 12 (twelve) hours for 90 days (Patient not taking: Reported on 06/06/2023) 180 tablet 3  . aspirin  81 MG EC tablet Take 1 tablet (81 mg total) by mouth at bedtime (Patient not taking: Reported on 06/06/2023) 90 tablet 0  . blood-glucose sensor (FREESTYLE LIBRE 3 SENSOR) Devi Use 1 Device every 14 (fourteen) days (Patient not taking: Reported on 06/06/2023) 6 each 3  . ONETOUCH ULTRA TEST test strip USE 1 STRIP TO CHECK GLUCOSE TWICE DAILY (Patient not taking: Reported on 06/06/2023) 200 each 3  . pantoprazole  (PROTONIX ) 40 MG DR tablet Take 1 tablet (40 mg total) by mouth once daily (Patient not taking: Reported on 06/06/2023) 90 tablet 0   No current facility-administered medications for this visit.     Physical Exam:  Vital Signs: BP 133/74 (BP Location: Left upper arm, Patient Position: Sitting, BP Cuff Size: Adult)   Pulse 73   Ht 154.9 cm (5' 1)   Wt 64.7 kg (142 lb 9.6 oz)   SpO2 96%   BMI 26.94 kg/m  Body mass index is 26.94 kg/m. General appearance: He appears well, in no apparent distress.  Alert and oriented times three.  Lungs: clear to auscultation, no wheezes, rales or rhonchi, symmetric air entry  CV: normal rate, regular rhythm, normal S1, S2, no murmurs, rubs, clicks or gallops  Skin: The device is located in the left infra-clavicular fossa.  There is no erythema, erosion or discharge. Extremities: 0 - Absent; no clubbing or cyanosis   Procedure: Please  also see separate EP CIED procedure note for details of the device interrogation and programming.    ECG  A sensed biventricular paced rhythm 73 bpm.     Assessment andPlan: Diagnoses and all orders for this visit:  Biventricular implantable cardioverter-defibrillator in situ -     ECG 12-lead; Standing  Fitting or adjustment of automatic implantable cardioverter-defibrillator  Other orders -     Follow up in Cardiology  Stable battery at 2.8 years and stable lead trends with LV pacing at 99.6%.  Excellent heart rate histograms.  Single episode of SVT otherwise no major arrhythmias underlying rhythm is sinus rhythm.  She is doing very well from a cardiac perspective and no changes were made to medications or device programming today.  Follow up: q 91 days x 3 and RTC 1 year  I spent a total of  40 minutes in both face-to-face and non-face-to-face activities for this visit on the date of this encounter.  Future Appointments     Date/Time Provider Department Center Visit Type   07/07/2023 2:00 PM [facility] WEST LAB [facility] C LAB   07/07/2023 2:15 PM Solum, Therisa Setter, MD [facility] C RETURN VISIT   08/20/2023 11:00 AM (Arrive by 10:30 AM) Vicci Rosaline Glatter, NP [facility] 4th Floor [facility] CARD RETURN   01/23/2024 11:40 AM [facility] MAM 1 Mammography at [facility] MM SCREEN BREAST TOMO BILAT   01/23/2024 1:00 PM Jackquline Joen Helling, NP [facility] ONC SURVIVORSHIP NEW        Attestation Statement:   I personally performed the service, non-incident to. [facility])   Amber Stephane Ano, NP

## 2023-06-10 ENCOUNTER — Other Ambulatory Visit: Payer: Self-pay | Admitting: Family Medicine

## 2023-06-10 NOTE — Telephone Encounter (Signed)
 Refill request for  traMADol HCl 50 MG Oral Tablet   LOV - 04/23/23 Next OV - not scheduled Last refill - 03/11/23 #60/0

## 2023-06-16 ENCOUNTER — Other Ambulatory Visit: Payer: Self-pay | Admitting: Family Medicine

## 2023-06-21 ENCOUNTER — Other Ambulatory Visit: Payer: Self-pay | Admitting: Family Medicine

## 2023-06-23 NOTE — Telephone Encounter (Signed)
Name of Medication: Tramadol Name of Pharmacy: [facility] [address] Last Fill or Written Date and Quantity: 06/10/23 # 60 tabs/ 0 refill Last Office Visit and Type: CPE 04/23/23 Next Office Visit and Type: none scheduled

## 2023-07-04 ENCOUNTER — Emergency Department: Payer: Medicare HMO

## 2023-07-04 ENCOUNTER — Emergency Department
Admission: EM | Admit: 2023-07-04 | Discharge: 2023-07-04 | Disposition: A | Discharge status: Home / Self Care | Payer: Medicare HMO | Attending: Emergency Medicine | Admitting: Emergency Medicine

## 2023-07-04 ENCOUNTER — Other Ambulatory Visit: Payer: Self-pay

## 2023-07-04 DIAGNOSIS — E1122 Type 2 diabetes mellitus with diabetic chronic kidney disease: Secondary | ICD-10-CM | POA: Diagnosis not present

## 2023-07-04 DIAGNOSIS — I959 Hypotension, unspecified: Secondary | ICD-10-CM | POA: Diagnosis not present

## 2023-07-04 DIAGNOSIS — E860 Dehydration: Secondary | ICD-10-CM | POA: Insufficient documentation

## 2023-07-04 DIAGNOSIS — R55 Syncope and collapse: Secondary | ICD-10-CM | POA: Insufficient documentation

## 2023-07-04 DIAGNOSIS — I129 Hypertensive chronic kidney disease with stage 1 through stage 4 chronic kidney disease, or unspecified chronic kidney disease: Secondary | ICD-10-CM | POA: Insufficient documentation

## 2023-07-04 DIAGNOSIS — N189 Chronic kidney disease, unspecified: Secondary | ICD-10-CM | POA: Diagnosis not present

## 2023-07-04 DIAGNOSIS — R531 Weakness: Secondary | ICD-10-CM | POA: Diagnosis not present

## 2023-07-04 DIAGNOSIS — Z743 Need for continuous supervision: Secondary | ICD-10-CM | POA: Diagnosis not present

## 2023-07-04 DIAGNOSIS — I672 Cerebral atherosclerosis: Secondary | ICD-10-CM | POA: Diagnosis not present

## 2023-07-04 DIAGNOSIS — R519 Headache, unspecified: Secondary | ICD-10-CM | POA: Diagnosis not present

## 2023-07-04 LAB — COMPREHENSIVE METABOLIC PANEL
ALT: 20 U/L (ref 0–44)
AST: 27 U/L (ref 15–41)
Albumin: 3.2 g/dL — ABNORMAL LOW (ref 3.5–5.0)
Alkaline Phosphatase: 70 U/L (ref 38–126)
Anion gap: 9 (ref 5–15)
BUN: 26 mg/dL — ABNORMAL HIGH (ref 8–23)
CO2: 26 mmol/L (ref 22–32)
Calcium: 8.9 mg/dL (ref 8.9–10.3)
Chloride: 105 mmol/L (ref 98–111)
Creatinine, Ser: 1.5 mg/dL — ABNORMAL HIGH (ref 0.44–1.00)
GFR, Estimated: 34 mL/min — ABNORMAL LOW (ref >60)
Glucose, Bld: 135 mg/dL — ABNORMAL HIGH (ref 70–99)
Potassium: 4.3 mmol/L (ref 3.5–5.1)
Sodium: 140 mmol/L (ref 135–145)
Total Bilirubin: 0.9 mg/dL (ref 0.0–1.2)
Total Protein: 5.9 g/dL — ABNORMAL LOW (ref 6.5–8.1)

## 2023-07-04 LAB — CBC WITH DIFFERENTIAL/PLATELET
Abs Immature Granulocytes: 0.03 10*3/uL (ref 0.00–0.07)
Basophils Absolute: 0 10*3/uL (ref 0.0–0.1)
Basophils Relative: 0 %
Eosinophils Absolute: 0.3 10*3/uL (ref 0.0–0.5)
Eosinophils Relative: 4 %
HCT: 41 % (ref 36.0–46.0)
Hemoglobin: 13.3 g/dL (ref 12.0–15.0)
Immature Granulocytes: 0 %
Lymphocytes Relative: 33 %
Lymphs Abs: 2.3 10*3/uL (ref 0.7–4.0)
MCH: 29.7 pg (ref 26.0–34.0)
MCHC: 32.4 g/dL (ref 30.0–36.0)
MCV: 91.5 fL (ref 80.0–100.0)
Monocytes Absolute: 0.5 10*3/uL (ref 0.1–1.0)
Monocytes Relative: 7 %
Neutro Abs: 3.7 10*3/uL (ref 1.7–7.7)
Neutrophils Relative %: 56 %
Platelets: 142 10*3/uL — ABNORMAL LOW (ref 150–400)
RBC: 4.48 MIL/uL (ref 3.87–5.11)
RDW: 12.7 % (ref 11.5–15.5)
WBC: 6.8 10*3/uL (ref 4.0–10.5)
nRBC: 0 % (ref 0.0–0.2)

## 2023-07-04 LAB — CBG MONITORING, ED: Glucose-Capillary: 140 mg/dL — ABNORMAL HIGH (ref 70–99)

## 2023-07-04 MED ORDER — SODIUM CHLORIDE 0.9 % IV BOLUS
1000.0000 mL | Freq: Once | INTRAVENOUS | Status: AC
  Administered 2023-07-04: 1000 mL via INTRAVENOUS

## 2023-07-04 NOTE — ED Provider Triage Note (Signed)
Emergency Medicine Provider Triage Evaluation Note  Morgan Hubbard , a 89 y.o. female  was evaluated in triage.  Pt complains of near syncopal episode. Was at school with family and got very sleepy.  Felt like she could not speak well and family states that eye lid looked droopy.  Now feels much better.   Lasted about 20 minutes.  Hx of TIA's  Review of Systems  Positive: + hx of TIAs, sleepy.  + headache Negative: No fever, nausea, vomiting.   Physical Exam  BP (!) 101/58   Pulse (!) 59   Temp 98.5 F (36.9 C) (Oral)   Resp 18   Ht 5' (1.524 m)   Wt 66 kg   LMP 06/03/1992   SpO2 98%   BMI 28.42 kg/m  Gen:   Awake, no distress  speech normal, answers questions  Resp:  Normal effort  MSK:   Moves extremities without difficulty  Other:  Currently cranial nerves 2-12 grossly intact.  Speech normal.  Family report at baseline.    Medical Decision Making  Medically screening exam initiated at 12:42 PM.  Appropriate orders placed.  Morgan Hubbard was informed that the remainder of the evaluation will be completed by another provider, this initial triage assessment does not replace that evaluation, and the importance of remaining in the ED until their evaluation is complete.     Tommi Rumps, PA-C 07/04/23 1517

## 2023-07-04 NOTE — Discharge Instructions (Addendum)
Your lab tests and pacemaker report were all reassuring today.  You have not had any arrhythmia episodes recently.  Continue taking medications, increase your water intake, and follow-up with your doctor.

## 2023-07-04 NOTE — Telephone Encounter (Signed)
Received 3 boxes of Tresiba and 4 boxes of Ozempic from the Pt assistance program.   Called pt and no answer so left VM letting her know med is ready for pick up

## 2023-07-04 NOTE — ED Triage Notes (Signed)
First nurse note: Patient arrived by [employer] from school. Patient c/o weakness and headache that started while at school. A&O x4  Pacemaker present  EMS vitals: 75/46 b/p- after  500cc saline- 129/72 b/p 167 CBG 97% RA 97HR - paced rhythm

## 2023-07-04 NOTE — ED Triage Notes (Signed)
Refer to first nurse note. Pt presents to the ED via [facility] from grandson's school. Pt states that she was sitting waiting in a chair and she started to feel "icky". Pt states that she leaned down and closed her eyes because she does this when she doesn't feel good. Pt states that she just felt like she wanted to go to sleep. Family states that during this episode pt sounded like it was hard to get her words out. She also had bilateral drooping eyelids. States that she had a headache on the way here on the top of her head. No facial droop noted. Speech clear at time of triage. Provider present at time of triage

## 2023-07-04 NOTE — ED Provider Notes (Signed)
[facility] Provider Note    Event Date/Time   First MD Initiated Contact with Patient 07/04/23 1311     (approximate)   History   Chief Complaint: Near Syncope   HPI  Morgan Hubbard is a 89 y.o. female with a history of diabetes, hypertension, cardiomyopathy, CKD, TIA who comes to the ED due to an episode of dizziness.  Patient reports that she was in her usual state of health, except yesterday did not drink much fluids due to forgetfulness.  This is a frequent challenge for her.  This morning she had coffee and an energy bar for breakfast, and then was at a school function when she got lightheaded.  Felt like her vision was darkening, and she leaned over.  No lateralizing symptoms such as facial droop or weakness or paresthesia.  No speech difficulty.  EMS was called, noted initial blood pressure of 75/46, gave 500 mL saline, with normalization of her blood pressure.  Patient also states that she feels much more energetic after receiving the IV fluids.  Denies any pain whatsoever at any point.  No fever.  No headache or vision change.          Physical Exam   Triage Vital Signs: ED Triage Vitals  Encounter Vitals Group     BP 07/04/23 1235 (!) 101/58     Systolic BP Percentile --      Diastolic BP Percentile --      Pulse Rate 07/04/23 1235 (!) 59     Resp 07/04/23 1235 18     Temp 07/04/23 1235 98.5 F (36.9 C)     Temp Source 07/04/23 1235 Oral     SpO2 07/04/23 1235 98 %     Weight 07/04/23 1237 145 lb 8.1 oz (66 kg)     Height 07/04/23 1237 5' (1.524 m)     Head Circumference --      Peak Flow --      Pain Score 07/04/23 1236 0     Pain Loc --      Pain Education --      Exclude from Growth Chart --     Most recent vital signs: Vitals:   07/04/23 1235  BP: (!) 101/58  Pulse: (!) 59  Resp: 18  Temp: 98.5 F (36.9 C)  SpO2: 98%    General: Awake, no distress.  CV:  Good peripheral perfusion.  Regular rate and  rhythm Resp:  Normal effort.  Abd:  No distention.  Soft nontender Other:  Cranial nerves III through XII intact.  No motor drift.  No aphasia.  Normal speech and language.  Normal finger-to-nose and otherwise normal balance and coordination.  NIH stroke scale 0   ED Results / Procedures / Treatments   Labs (all labs ordered are listed, but only abnormal results are displayed) Labs Reviewed  COMPREHENSIVE METABOLIC PANEL - Abnormal; Notable for the following components:      Result Value   Glucose, Bld 135 (*)    BUN 26 (*)    Creatinine, Ser 1.50 (*)    Total Protein 5.9 (*)    Albumin 3.2 (*)    GFR, Estimated 34 (*)    All other components within normal limits  CBC WITH DIFFERENTIAL/PLATELET - Abnormal; Notable for the following components:   Platelets 142 (*)    All other components within normal limits  CBG MONITORING, ED - Abnormal; Notable for the following components:   Glucose-Capillary 140 (*)  All other components within normal limits     EKG Interpreted by me Atrial sensed ventricular paced rhythm, left axis, left bundle branch block, no acute ischemic changes.   RADIOLOGY CT head interpreted by me, appears normal.  Radiology report reviewed   PROCEDURES:  Procedures   MEDICATIONS ORDERED IN ED: Medications  sodium chloride 0.9 % bolus 1,000 mL (1,000 mLs Intravenous New Bag/Given 07/04/23 1421)     IMPRESSION / MDM / ASSESSMENT AND PLAN / ED COURSE  I reviewed the triage vital signs and the nursing notes.  DDx: Dehydration, vasovagal episode, paroxysmal arrhythmia, electrolyte derangement, AKI, anemia.  Doubt ACS PE dissection stroke   Patient's presentation is most consistent with acute presentation with potential threat to life or bodily function.  Patient presents with near syncope which appears to be due to dehydration, possibly prolonged exposure to a warm stuffy environment.  Doubt vascular pulmonary neurologic underlying cause.  No acute  pain.  Feels much better after IV fluids with normalization of her vital signs.  She is tolerating oral intake.  Pacemaker device was interrogated, report reviewed showing 0 arrhythmia in the past 4 weeks.  Can continue her medications, follow-up with her doctor.       FINAL CLINICAL IMPRESSION(S) / ED DIAGNOSES   Final diagnoses:  Near syncope  Dehydration     Rx / DC Orders   ED Discharge Orders     None        Note:  This document was prepared using Dragon voice recognition software and may include unintentional dictation errors.   Sharman Cheek, MD 07/04/23 [phone]

## 2023-07-07 DIAGNOSIS — E1169 Type 2 diabetes mellitus with other specified complication: Secondary | ICD-10-CM | POA: Diagnosis not present

## 2023-07-07 DIAGNOSIS — N1832 Chronic kidney disease, stage 3b: Secondary | ICD-10-CM | POA: Diagnosis not present

## 2023-07-07 DIAGNOSIS — Z794 Long term (current) use of insulin: Secondary | ICD-10-CM | POA: Diagnosis not present

## 2023-07-07 DIAGNOSIS — E1122 Type 2 diabetes mellitus with diabetic chronic kidney disease: Secondary | ICD-10-CM | POA: Diagnosis not present

## 2023-07-07 DIAGNOSIS — E1165 Type 2 diabetes mellitus with hyperglycemia: Secondary | ICD-10-CM | POA: Diagnosis not present

## 2023-07-07 DIAGNOSIS — E785 Hyperlipidemia, unspecified: Secondary | ICD-10-CM | POA: Diagnosis not present

## 2023-07-07 DIAGNOSIS — R809 Proteinuria, unspecified: Secondary | ICD-10-CM | POA: Diagnosis not present

## 2023-07-07 DIAGNOSIS — E1129 Type 2 diabetes mellitus with other diabetic kidney complication: Secondary | ICD-10-CM | POA: Diagnosis not present

## 2023-07-07 DIAGNOSIS — N184 Chronic kidney disease, stage 4 (severe): Secondary | ICD-10-CM | POA: Diagnosis not present

## 2023-07-08 ENCOUNTER — Telehealth: Payer: Self-pay

## 2023-07-08 NOTE — Transitions of Care (Post Inpatient/ED Visit) (Signed)
 07/08/2023  Name: Morgan Hubbard MRN: [id number] DOB: [birth year]  Today's TOC FU Call Status: Today's TOC FU Call Status:: Successful TOC FU Call Completed TOC FU Call Complete Date: 07/08/23 Patient's Name and Date of Birth confirmed.  Transition Care Management Follow-up Telephone Call Date of Discharge: 07/04/23 Discharge Facility: [facility]) Type of Discharge: Emergency Department Reason for ED Visit:  (dehydration) How have you been since you were released from the hospital?: Better Any questions or concerns?: No  Items Reviewed: Did you receive and understand the discharge instructions provided?: Yes Medications obtained,verified, and reconciled?: Yes (Medications Reviewed) Any new allergies since your discharge?: No Dietary orders reviewed?: NA Do you have support at home?: Yes People in Home: spouse  Medications Reviewed Today: Medications Reviewed Today     Reviewed by Shakevia Sarris, [place] LABOR, CMA (Certified Medical Assistant) on 07/08/23 at 1047  Med List Status: <None>   Medication Order Taking? Sig Documenting Provider Last Dose Status Informant  albuterol  (VENTOLIN  HFA) 108 (90 Base) MCG/ACT inhaler [id number] No Inhale 2 puffs into the lungs every 6 (six) hours as needed for wheezing or shortness of breath (cough). Tower, Laine LABOR, MD Taking Active   amLODipine  (NORVASC ) 2.5 MG tablet [id number] No Take 2.5 mg by mouth at bedtime. Norine Manuelita LABOR, MD Taking Active   atorvastatin  (LIPITOR ) 80 MG tablet [id number] No Take 1 tablet (80 mg total) by mouth daily. Gollan, Timothy J, MD Taking Active   benzonatate  (TESSALON ) 200 MG capsule [id number] No Take 1 capsule (200 mg total) by mouth 3 (three) times daily as needed for cough. Watt Mirza, MD Taking Active   BIOTIN 5000 PO [id number] No Take 5,000 mg by mouth daily. [provider] Taking Active   carvedilol  (COREG ) 12.5 MG tablet [id number] No Take 1 tablet (12.5 mg total) by mouth 2  (two) times daily with a meal. Gollan, Timothy J, MD Taking Active   cetirizine (ZYRTEC) 10 MG tablet [id number] No Take 10 mg by mouth daily. [provider] Taking Active Multiple Informants  clotrimazole-betamethasone (LOTRISONE) cream [id number] No Apply 1 application topically as needed (groin).  [provider] Taking Active Multiple Informants           Med Note WENONA, AMBER B   Wed Jun 10, 2018  5:40 PM)    Continuous Glucose Sensor (FREESTYLE LIBRE 3 SENSOR) [place] [id number] No Use to check glucose continuously Tower, Laine LABOR, MD Taking Active   dapagliflozin  propanediol (FARXIGA ) 5 MG TABS tablet [id number] No Take 5 mg by mouth daily. [provider] Taking Active   famotidine  (PEPCID ) 20 MG tablet [id number]  Take 1 tablet by mouth once daily Tower, Laine LABOR, MD  Active   gabapentin  (NEURONTIN ) 300 MG capsule [id number]  TAKE 1 CAPSULE BY MOUTH THREE TIMES DAILY Tower, Laine LABOR, MD  Active   [name]  75 MG TABS [id number] No Take 1 tablet by mouth once daily Tower, Laine LABOR, MD Taking Active   hydrALAZINE  (APRESOLINE ) 50 MG tablet [id number] No Take 1 tablet (50 mg total) by mouth 3 (three) times daily. Gollan, Timothy J, MD Taking Active   hydrocortisone  2.5 % cream [id number] No Apply topically 3 (three) times daily as needed. Jimmy Charlie FERNS, MD Taking Active   insulin  degludec (TRESIBA) 100 UNIT/ML FlexTouch Pen [id number] No Inject 26 Units into the skin at bedtime. [provider] Taking Active            Med Note JOSEPHINA, [place] N  Tue Nov 05, 2022  4:30 PM) [facility] [facility] PAP - Dr Damian  isosorbide  mononitrate (IMDUR ) 60 MG 24 hr tablet [id number] No Take 1 tablet (60 mg total) by mouth in the morning and at bedtime. Gollan, Timothy J, MD Taking Active   letrozole  (FEMARA ) 2.5 MG tablet [id number] No Take 2.5 mg by mouth daily.  Patient not taking: Reported on 04/23/2023   [provider] Not Taking Active Multiple Informants  LORazepam  (ATIVAN )  0.5 MG tablet [id number] No Take 1 tablet by mouth every 8 (eight) hours as needed. [provider] Taking Active   nystatin cream (MYCOSTATIN) [id number] No Apply 1 application topically 2 (two) times daily as needed (irritation.).  [provider] Taking Active Multiple Informants  pantoprazole  (PROTONIX ) 40 MG tablet [id number] No Take 1 tablet by mouth once daily Tower, Laine LABOR, MD Taking Active Multiple Informants  promethazine -dextromethorphan (PROMETHAZINE -DM) 6.25-15 MG/5ML syrup [id number] No Take 5 mLs by mouth at bedtime as needed for cough. Caution of sedation Tower, Laine LABOR, MD Taking Active   Respiratory Therapy Supplies (NEBULIZER/TUBING/MOUTHPIECE) KIT [id number] No 1 each by Does not apply route 3 (three) times daily as needed. Corwin Antu, FNP Taking Active   Rivaroxaban (XARELTO) 15 MG TABS tablet [id number] No Take 15 mg by mouth daily with supper. [provider] Taking Active   Semaglutide, 2 MG/DOSE, (OZEMPIC, 2 MG/DOSE,) 8 MG/3ML SOPN [id number] No Inject 2 mg into the skin once a week. Damian Therisa HERO, MD Taking Active            Med Note JOSEPHINA MORNA SAILOR   Tue Nov 05, 2022  4:37 PM) [facility] [facility] PAP - Dr Solum  traMADol  (ULTRAM ) 50 MG tablet [id number]  Take 1-2 tablets (50-100 mg total) by mouth every 6 (six) hours as needed for severe pain (pain score 7-10). Tower, Laine LABOR, MD  Active   vitamin B-12 (CYANOCOBALAMIN ) 1000 MCG tablet [id number] No Take 500 mcg by mouth daily. [provider] Taking Active Multiple Informants            Home Care and Equipment/Supplies: Were Home Health Services Ordered?: NA Any new equipment or medical supplies ordered?: NA  Functional Questionnaire: Do you need assistance with bathing/showering or dressing?: No Do you need assistance with meal preparation?: No Do you need assistance with eating?: No Do you have difficulty maintaining continence: No Do you need assistance with getting out of  bed/getting out of a chair/moving?: No Do you have difficulty managing or taking your medications?: No  Follow up appointments reviewed: PCP Follow-up appointment confirmed?: Yes Date of PCP follow-up appointment?: 07/09/23 Follow-up Provider: Laine Balls, MD Specialist Hospital Follow-up appointment confirmed?: NA Do you need transportation to your follow-up appointment?: No Do you understand care options if your condition(s) worsen?: Yes-patient verbalized understanding    Kewan Mcnease, CMA  [facility] AWV Team Direct Dial: [phone]

## 2023-07-09 ENCOUNTER — Ambulatory Visit: Payer: Medicare HMO | Admitting: Family Medicine

## 2023-07-09 ENCOUNTER — Encounter: Payer: Self-pay | Admitting: Family Medicine

## 2023-07-09 VITALS — BP 104/52 | HR 69 | Temp 97.6°F | Ht 60.0 in | Wt 147.4 lb

## 2023-07-09 DIAGNOSIS — E1129 Type 2 diabetes mellitus with other diabetic kidney complication: Secondary | ICD-10-CM

## 2023-07-09 DIAGNOSIS — I10 Essential (primary) hypertension: Secondary | ICD-10-CM | POA: Diagnosis not present

## 2023-07-09 DIAGNOSIS — R2689 Other abnormalities of gait and mobility: Secondary | ICD-10-CM | POA: Insufficient documentation

## 2023-07-09 DIAGNOSIS — R55 Syncope and collapse: Secondary | ICD-10-CM

## 2023-07-09 DIAGNOSIS — N183 Chronic kidney disease, stage 3 unspecified: Secondary | ICD-10-CM | POA: Diagnosis not present

## 2023-07-09 DIAGNOSIS — E1169 Type 2 diabetes mellitus with other specified complication: Secondary | ICD-10-CM

## 2023-07-09 DIAGNOSIS — E785 Hyperlipidemia, unspecified: Secondary | ICD-10-CM

## 2023-07-09 DIAGNOSIS — I5022 Chronic systolic (congestive) heart failure: Secondary | ICD-10-CM

## 2023-07-09 NOTE — Assessment & Plan Note (Signed)
 In setting of advanced age / past cva/ complex medical history and polypharmacy  Also frequent dehydration   Discussed fall precautions in detail  Would like to avoid sedating meds unless abs nec  Encouraged better hydration  PT may be option in future  Encouraged strongly to use walker at all times

## 2023-07-09 NOTE — Patient Instructions (Addendum)
 You need to keep up with more fluid intake  When you get dehydrated -then you will be more prone to fainting and feeling bad   I would like your daily fluids to approach 60 oz  As a rule - mostly water but other things are ok also  Work up to this gradually  If you note shortness of breath with more fluids -hold off and let us  and your cardiologist know   Take care of yourself   Let your endocrinologist know about the possible side effects from farxiga    To prevent falls/ always use a walker  Fluids may help balance a bit   There are lots of protein sources besides meat  The following are examples of protein in diet  Meat  Fish  Eggs  Dairy products  Soy products  Oat milk  Almond milk Nuts and nut butters  Dried beans

## 2023-07-09 NOTE — Assessment & Plan Note (Signed)
 Disc goals for lipids and reasons to control them Rev last labs with pt Rev low sat fat diet in detail LDL 48 Plan to continue atorvastatin 80 mg daily  Also cardiology care

## 2023-07-09 NOTE — Assessment & Plan Note (Signed)
 Bp remains low normal on current medications that are morso for cardiomyopathy/ chf   Did have presyncope when dehydrated / improved now   BP: (!) 104/52   Amlodipine  2.5 mg daily  Coreg  12.5 g tid Imdur  60 mg bid Hydralazine  50 mg daily   No diuretics   Under cardiology care  Reviewed signs and symptoms of hypotension to watch for

## 2023-07-09 NOTE — Progress Notes (Signed)
 Subjective:    Patient ID: Morgan Hubbard, female    DOB: Oct 15, 1934, 89 y.o.   MRN: [id number]  HPI  Wt Readings from Last 3 Encounters:  07/09/23 147 lb 6 oz (66.8 kg)  07/04/23 145 lb 8.1 oz (66 kg)  04/23/23 147 lb (66.7 kg)   28.78 kg/m  Vitals:   07/09/23 1418  BP: (!) 104/52  Pulse: 69  Temp: 97.6 F (36.4 C)  SpO2: 96%    Pt presents for follow up of ER visit 07/04/23 for near syncope  Also for  Poor balance ? Side effects of farxiga   Low normal b/p    She became faint at a school function a day after not drinking enough fluids  No cva symptoms or neuro changes  EMS called / noted initial blood pressure was 75/46 She was given 500 mL of saline and blood pressure normalized and she felt much better  No LOC or head trauma   Per hospital note a/p:  Patient presents with near syncope which appears to be due to dehydration, possibly prolonged exposure to a warm stuffy environment.  Doubt vascular pulmonary neurologic underlying cause.  No acute pain.  Feels much better after IV fluids with normalization of her vital signs.  She is tolerating oral intake.   Pacemaker device was interrogated, report reviewed showing 0 arrhythmia in the past 4 weeks.  Can continue her medications, follow-up with her doctor.      BP Readings from Last 3 Encounters:  07/09/23 (!) 104/52  07/04/23 110/60  04/23/23 138/64   Pulse Readings from Last 3 Encounters:  07/09/23 69  07/04/23 60  04/23/23 74   Admission on 07/04/2023, Discharged on 07/04/2023  Component Date Value Ref Range Status   Sodium 07/04/2023 140  135 - 145 mmol/L Final   Potassium 07/04/2023 4.3  3.5 - 5.1 mmol/L Final   Chloride 07/04/2023 105  98 - 111 mmol/L Final   CO2 07/04/2023 26  22 - 32 mmol/L Final   Glucose, Bld 07/04/2023 135 (H)  70 - 99 mg/dL Final   Glucose reference range applies only to samples taken after fasting for at least 8 hours.   BUN 07/04/2023 26 (H)  8 - 23 mg/dL Final    Creatinine, Ser 07/04/2023 1.50 (H)  0.44 - 1.00 mg/dL Final   Calcium  07/04/2023 8.9  8.9 - 10.3 mg/dL Final   Total Protein 98/68/7974 5.9 (L)  6.5 - 8.1 g/dL Final   Albumin 98/68/7974 3.2 (L)  3.5 - 5.0 g/dL Final   AST 98/68/7974 27  15 - 41 U/L Final   ALT 07/04/2023 20  0 - 44 U/L Final   Alkaline Phosphatase 07/04/2023 70  38 - 126 U/L Final   Total Bilirubin 07/04/2023 0.9  0.0 - 1.2 mg/dL Final   GFR, Estimated 07/04/2023 34 (L)  >60 mL/min Final   Comment: (NOTE) Calculated using the CKD-EPI Creatinine Equation (2021)    Anion gap 07/04/2023 9  5 - 15 Final   Performed at [facility], [address] [place], [place] [address]   WBC 07/04/2023 6.8  4.0 - 10.5 K/uL Final   RBC 07/04/2023 4.48  3.87 - 5.11 MIL/uL Final   Hemoglobin 07/04/2023 13.3  12.0 - 15.0 g/dL Final   HCT 98/68/7974 41.0  36.0 - 46.0 % Final   MCV 07/04/2023 91.5  80.0 - 100.0 fL Final   MCH 07/04/2023 29.7  26.0 - 34.0 pg Final   MCHC 07/04/2023  32.4  30.0 - 36.0 g/dL Final   RDW 98/68/7974 12.7  11.5 - 15.5 % Final   Platelets 07/04/2023 142 (L)  150 - 400 K/uL Final   nRBC 07/04/2023 0.0  0.0 - 0.2 % Final   Neutrophils Relative % 07/04/2023 56  % Final   Neutro Abs 07/04/2023 3.7  1.7 - 7.7 K/uL Final   Lymphocytes Relative 07/04/2023 33  % Final   Lymphs Abs 07/04/2023 2.3  0.7 - 4.0 K/uL Final   Monocytes Relative 07/04/2023 7  % Final   Monocytes Absolute 07/04/2023 0.5  0.1 - 1.0 K/uL Final   Eosinophils Relative 07/04/2023 4  % Final   Eosinophils Absolute 07/04/2023 0.3  0.0 - 0.5 K/uL Final   Basophils Relative 07/04/2023 0  % Final   Basophils Absolute 07/04/2023 0.0  0.0 - 0.1 K/uL Final   Immature Granulocytes 07/04/2023 0  % Final   Abs Immature Granulocytes 07/04/2023 0.03  0.00 - 0.07 K/uL Final   Performed at [facility], [address] [place], [place] [address]   Glucose-Capillary 07/04/2023 140 (H)  70 - 99 mg/dL Final   Glucose reference range applies  only to samples taken after fasting for at least 8 hours.    Lab Results  Component Value Date   TSH 4.43 03/26/2023     She sees nephrology for CKD stage 3   Pt does have HTN and sees cardiology for this and cardiomyopathy (also history of small vessel stroke) Takes Amlodipine  2.5 mg daily  Coreg  12.5 mg tid  Imdur  60 mg bid Hydralazine  50 mg daily    Xarelto for anti coag Atorvastatin  80 mg for lipids  Lab Results  Component Value Date   CHOL 113 03/26/2023   HDL 33.00 (L) 03/26/2023   LDLCALC 48 03/26/2023   LDLDIRECT 46.0 06/12/2020   TRIG 161.0 (H) 03/26/2023   CHOLHDL 3 03/26/2023   She drinks one cup of coffee per day  She does measure water- 16 oz per day [employer] diet pepsi   Patient Active Problem List   Diagnosis Date Noted   Poor balance 07/09/2023   Injury of right toe, initial encounter 04/23/2023   Current use of proton pump inhibitor 03/24/2023   Rectal bleeding 02/17/2023   Senile purpura (HCC) 01/20/2023   Stage 3 chronic kidney disease, unspecified whether stage 3a or 3b CKD (HCC) 08/05/2022   Pain due to onychomycosis of toenails of both feet 04/04/2022   Pre-syncope 11/29/2021   History of breast cancer 06/18/2020   Elevated TSH 06/16/2020   Occipital neuralgia 02/14/2020   Hair loss 11/09/2018   Leg weakness, bilateral 06/29/2018   Pedal edema 06/22/2018   AICD (automatic cardioverter/defibrillator) present 06/11/2018   GERD (gastroesophageal reflux disease) 06/10/2018   Chronic back pain 05/23/2017   Chronic systolic (congestive) heart failure (HCC) 02/25/2017   Osteopenia 06/16/2016   Routine general medical examination at a health care facility 04/24/2016   Estrogen deficiency 04/24/2016   Stroke, small vessel (HCC) 01/18/2014   Family history of hemochromatosis 01/18/2014   Nonischemic cardiomyopathy (HCC) 04/05/2013   Elevated transaminase level 05/18/2012   Spinal stenosis of lumbar region 12/10/2011   Mixed incontinence urge  and stress 12/10/2011   Renal insufficiency 11/04/2011   Goiter 01/14/2011   Fatty liver 08/28/2010   Type II diabetes mellitus with renal manifestations (HCC) 07/09/2010   Hyperlipidemia associated with type 2 diabetes mellitus (HCC) 07/09/2010   Reflex sympathetic dystrophy 07/09/2010   Neuropathy 07/09/2010  Essential hypertension 07/09/2010   CARDIOMYOPATHY 07/09/2010   OVERACTIVE BLADDER 07/09/2010   Past Medical History:  Diagnosis Date   Arthritis    hands (01/13/2014)   Basal cell carcinoma 01/2014   bridge of nose   Cardiac LV ejection fraction >40%    it was 43 last year (01/13/2014)   Chronic lower back pain    CKD (chronic kidney disease), stage III (HCC)    acute on chronic stage III/notes 06/10/2018   Colon polyps    Expressive aphasia    3 times in the last week (01/13/2014)   GERD (gastroesophageal reflux disease)    Goiter past remote   treated with RI   Hyperlipidemia    Hyperpotassemia    Hypertension    Hypertonicity of bladder    Inflammatory and toxic neuropathy, unspecified    LBBB (left bundle branch block)    Migraine    stopped many years ago (01/13/2014)   Mini stroke 01/2014   Other primary cardiomyopathies    Overactive bladder    Presence of combination internal cardiac defibrillator (ICD) and pacemaker    Reflex sympathetic dystrophy, unspecified    Shingles    Shortness of breath    ambulation   Stroke (HCC)    Thyroid  disease    Type II diabetes mellitus (HCC)    Ulcer    Urine incontinence    Vertigo    hx of   Past Surgical History:  Procedure Laterality Date   BACK SURGERY     BI-VENTRICULAR PACEMAKER INSERTION (CRT-P)  10/2014   [facility]   BREAST CYST EXCISION Left 1959   CARDIAC CATHETERIZATION  several   [place]   CATARACT EXTRACTION W/ INTRAOCULAR LENS IMPLANT Left ~ 2005   several eye injections   CATARACT EXTRACTION W/PHACO Right 05/04/2020   Procedure: CATARACT EXTRACTION PHACO AND INTRAOCULAR LENS PLACEMENT  (IOC) RIGHT DIABETIC;  Surgeon: Jaye Fallow, MD;  Location: [facility] ORS;  Service: Ophthalmology;  Laterality: Right;  US  00:58.5 CDE 6.70 Fluid Pack [place] # [id number]   CHOLECYSTECTOMY N/A 06/13/2018   Procedure: LAPAROSCOPIC CHOLECYSTECTOMY;  Surgeon: Belinda Cough, MD;  Location: [facility] OR;  Service: General;  Laterality: N/A;   COLONOSCOPY  7/12   normal (hx of polyps in past)    CT SCAN  3/12   outside hosp- lung nodule and gallstones   ERCP N/A 06/12/2018   Procedure: ENDOSCOPIC RETROGRADE CHOLANGIOPANCREATOGRAPHY (ERCP);  Surgeon: Rollin Dover, MD;  Location: [facility] ENDOSCOPY;  Service: Endoscopy;  Laterality: N/A;   FINGER SURGERY Left 1959 & 1976   knot on index   KIDNEY DONATION Left 1989   LUMBAR LAMINECTOMY/DECOMPRESSION MICRODISCECTOMY  1999   LUMBAR LAMINECTOMY/DECOMPRESSION MICRODISCECTOMY  01/31/2012   Procedure: LUMBAR LAMINECTOMY/DECOMPRESSION MICRODISCECTOMY 2 LEVELS;  Surgeon: Alm GORMAN Molt, MD;  Location: [facility] NEURO ORS;  Service: Neurosurgery;  Laterality: N/A;  Thoracic twelve-lumbar one, lumbar one-two laminectomy    POSTERIOR LAMINECTOMY / DECOMPRESSION CERVICAL SPINE  1999   REMOVAL OF STONES  06/12/2018   Procedure: REMOVAL OF STONES;  Surgeon: Rollin Dover, MD;  Location: [facility] ENDOSCOPY;  Service: Endoscopy;;   SPHINCTEROTOMY  06/12/2018   Procedure: ANNETT;  Surgeon: Rollin Dover, MD;  Location: [facility] ENDOSCOPY;  Service: Endoscopy;;   TUBAL LIGATION  1976   UPPER GASTROINTESTINAL ENDOSCOPY     Social History   Tobacco Use   Smoking status: Never   Smokeless tobacco: Never  Vaping Use   Vaping status: Never Used  Substance Use Topics   Alcohol use: No  Alcohol/week: 0.0 standard drinks of alcohol   Drug use: No   Family History  Problem Relation Age of Onset   Arthritis Mother    Cancer Mother        uterine and mouth   Hyperlipidemia Mother    Stroke Mother    Hypertension Mother    Alcohol abuse Father    Diabetes Father    Cancer Sister         breast   Diabetes Sister    Hyperlipidemia Sister    Heart disease Sister    Hypertension Sister    Diabetes Sister    Cancer Brother        lung cancer   Kidney disease Brother    Diabetes Brother    Hyperlipidemia Brother    Kidney failure Brother    Diabetes Daughter    Heart attack Daughter    Addison's disease Other    Allergies  Allergen Reactions   Ace Inhibitors Swelling    REACTION: tongue swelling   Lisinopril Swelling    Swelling of tongue   Insulin  Detemir Itching   Nsaids Other (See Comments)    Renal issues   Codeine Nausea Only   Hydrochlorothiazide Other (See Comments)    Dizziness/funny feeling   Tylenol  [Acetaminophen ] Other (See Comments)    Fatty liver - use with caution   Current Outpatient Medications on File Prior to Visit  Medication Sig Dispense Refill   albuterol  (VENTOLIN  HFA) 108 (90 Base) MCG/ACT inhaler Inhale 2 puffs into the lungs every 6 (six) hours as needed for wheezing or shortness of breath (cough). 8 g 1   amLODipine  (NORVASC ) 2.5 MG tablet Take 2.5 mg by mouth at bedtime.     atorvastatin  (LIPITOR ) 80 MG tablet Take 1 tablet (80 mg total) by mouth daily. 90 tablet 3   benzonatate  (TESSALON ) 200 MG capsule Take 1 capsule (200 mg total) by mouth 3 (three) times daily as needed for cough. 40 capsule 1   BIOTIN 5000 PO Take 5,000 mg by mouth daily.     carvedilol  (COREG ) 12.5 MG tablet Take 1 tablet (12.5 mg total) by mouth 2 (two) times daily with a meal. 180 tablet 3   cetirizine (ZYRTEC) 10 MG tablet Take 10 mg by mouth daily.     clotrimazole-betamethasone (LOTRISONE) cream Apply 1 application topically as needed (groin).      Continuous Glucose Sensor (FREESTYLE LIBRE 3 SENSOR) MISC Use to check glucose continuously 6 each 3   dapagliflozin  propanediol (FARXIGA ) 5 MG TABS tablet Take 5 mg by mouth daily.     famotidine  (PEPCID ) 20 MG tablet Take 1 tablet by mouth once daily 90 tablet 1   gabapentin  (NEURONTIN ) 300 MG capsule TAKE  1 CAPSULE BY MOUTH THREE TIMES DAILY 270 capsule 2   [name]  75 MG TABS Take 1 tablet by mouth once daily 90 tablet 0   hydrALAZINE  (APRESOLINE ) 50 MG tablet Take 1 tablet (50 mg total) by mouth 3 (three) times daily. 270 tablet 3   hydrocortisone  2.5 % cream Apply topically 3 (three) times daily as needed. 28 g 3   insulin  degludec (TRESIBA) 100 UNIT/ML FlexTouch Pen Inject 26 Units into the skin at bedtime.     isosorbide  mononitrate (IMDUR ) 60 MG 24 hr tablet Take 1 tablet (60 mg total) by mouth in the morning and at bedtime. 180 tablet 3   LORazepam  (ATIVAN ) 0.5 MG tablet Take 1 tablet by mouth every 8 (eight) hours as needed.  nystatin cream (MYCOSTATIN) Apply 1 application topically 2 (two) times daily as needed (irritation.).      pantoprazole  (PROTONIX ) 40 MG tablet Take 1 tablet by mouth once daily 90 tablet 1   Respiratory Therapy Supplies (NEBULIZER/TUBING/MOUTHPIECE) KIT 1 each by Does not apply route 3 (three) times daily as needed. 1 kit 0   Rivaroxaban (XARELTO) 15 MG TABS tablet Take 15 mg by mouth daily with supper.     Semaglutide, 2 MG/DOSE, (OZEMPIC, 2 MG/DOSE,) 8 MG/3ML SOPN Inject 2 mg into the skin once a week.     traMADol  (ULTRAM ) 50 MG tablet Take 1-2 tablets (50-100 mg total) by mouth every 6 (six) hours as needed for severe pain (pain score 7-10). 60 tablet 0   vitamin B-12 (CYANOCOBALAMIN ) 1000 MCG tablet Take 500 mcg by mouth daily.     No current facility-administered medications on file prior to visit.    Review of Systems  Constitutional:  Positive for fatigue. Negative for activity change, appetite change, fever and unexpected weight change.  HENT:  Negative for congestion, ear pain, rhinorrhea, sinus pressure and sore throat.   Eyes:  Negative for pain, redness and visual disturbance.  Respiratory:  Negative for cough, shortness of breath and wheezing.   Cardiovascular:  Negative for chest pain and palpitations.  Gastrointestinal:  Negative for abdominal  pain, blood in stool, constipation and diarrhea.  Endocrine: Negative for polydipsia and polyuria.  Genitourinary:  Negative for dysuria, frequency and urgency.  Musculoskeletal:  Negative for arthralgias, back pain and myalgias.  Skin:  Negative for pallor and rash.  Allergic/Immunologic: Negative for environmental allergies.  Neurological:  Positive for light-headedness. Negative for dizziness, tremors, seizures, syncope, facial asymmetry, speech difficulty, numbness and headaches.       Poor balance   All over weakness Not focal   Hematological:  Negative for adenopathy. Does not bruise/bleed easily.  Psychiatric/Behavioral:  Negative for decreased concentration and dysphoric mood. The patient is not nervous/anxious.        Objective:   Physical Exam Constitutional:      General: She is not in acute distress.    Appearance: Normal appearance. She is well-developed and normal weight. She is not ill-appearing or diaphoretic.     Comments: Frail appearing  [place] today  HENT:     Head: Normocephalic and atraumatic.     Mouth/Throat:     Mouth: Mucous membranes are moist.     Comments: Hoarse voice occasionally -improved with clearing throat  Eyes:     Conjunctiva/sclera: Conjunctivae normal.     Pupils: Pupils are equal, round, and reactive to light.  Neck:     Thyroid : No thyromegaly.     Vascular: No carotid bruit or JVD.  Cardiovascular:     Rate and Rhythm: Normal rate and regular rhythm.     Heart sounds: Murmur heard.     No gallop.  Pulmonary:     Effort: Pulmonary effort is normal. No respiratory distress.     Breath sounds: Normal breath sounds. No stridor. No wheezing, rhonchi or rales.     Comments: No crackles or rales  Abdominal:     General: There is no distension or abdominal bruit.     Palpations: Abdomen is soft.  Musculoskeletal:     Cervical back: Normal range of motion and neck supple.     Right lower leg: No edema.     Left lower leg: No edema.   Lymphadenopathy:     Cervical: No cervical adenopathy.  Skin:    General: Skin is warm and dry.     Coloration: Skin is not jaundiced or pale.     Findings: No bruising or rash.  Neurological:     Mental Status: She is alert.     Cranial Nerves: No cranial nerve deficit.     Coordination: Coordination normal.     Deep Tendon Reflexes: Reflexes are normal and symmetric. Reflexes normal.     Comments: Unsteady gait  Needs help of another person   Psychiatric:        Mood and Affect: Mood normal.           Assessment & Plan:   Problem List Items Addressed This Visit       Cardiovascular and Mediastinum   Pre-syncope - Primary   Causing ER visit on 1/31 Reviewed hospital records, lab results and studies in detail  Thought to be due to dehydration in setting of not drinking enough   Discussed potential causes of dehydration including Lack of thirst and fluid intake  Medication (especially farxiga )  Diabetes/ elevated glucose   Also causes of low blood pressure - cardiac medicines  Sedating /pain medicines (minimize if able)   Better exam and vitals today  Feels much better when hydrated  Made plan to gradually increase fluid intake  See AVS for ins  Instructed that if increase fluid causes shortness of breath or ankle swelling to hold and let us  know   Also discussed fall prevention    She will discuss all with her endocrinologist and cardiologist at next visit         Essential hypertension   Bp remains low normal on current medications that are morso for cardiomyopathy/ chf   Did have presyncope when dehydrated / improved now   BP: (!) 104/52   Amlodipine  2.5 mg daily  Coreg  12.5 g tid Imdur  60 mg bid Hydralazine  50 mg daily   No diuretics   Under cardiology care  Reviewed signs and symptoms of hypotension to watch for         Endocrine   Type II diabetes mellitus with renal manifestations (HCC)   Pt is currently struggling with episodic  dehydration and GU itching /yeast   Unsure if pros outweigh cons for farxiga   Pt plans to discuss with her endocrinologist        Hyperlipidemia associated with type 2 diabetes mellitus (HCC)   Disc goals for lipids and reasons to control them Rev last labs with pt Rev low sat fat diet in detail LDL 48 Plan to continue atorvastatin  80 mg daily  Also cardiology care         Genitourinary   Stage 3 chronic kidney disease, unspecified whether stage 3a or 3b CKD (HCC)   Discussed importance of fluid intake for renal health  Last GFR was 34 Cr 1.5  Continues nephrology care         Other   Poor balance   In setting of advanced age / past cva/ complex medical history and polypharmacy  Also frequent dehydration   Discussed fall precautions in detail  Would like to avoid sedating meds unless abs nec  Encouraged better hydration  PT may be option in future  Encouraged strongly to use walker at all times       Other Visit Diagnoses       Chronic systolic CHF (congestive heart failure) (HCC)   (Chronic)

## 2023-07-09 NOTE — Assessment & Plan Note (Signed)
Discussed importance of fluid intake for renal health  Last GFR was 34 Cr 1.5  Continues nephrology care

## 2023-07-09 NOTE — Assessment & Plan Note (Signed)
 Pt is currently struggling with episodic dehydration and GU itching /yeast   Unsure if pros outweigh cons for farxiga  Pt plans to discuss with her endocrinologist

## 2023-07-09 NOTE — Assessment & Plan Note (Signed)
 Causing ER visit on 1/31 Reviewed hospital records, lab results and studies in detail  Thought to be due to dehydration in setting of not drinking enough   Discussed potential causes of dehydration including Lack of thirst and fluid intake  Medication (especially farxiga )  Diabetes/ elevated glucose   Also causes of low blood pressure - cardiac medicines  Sedating /pain medicines (minimize if able)   Better exam and vitals today  Feels much better when hydrated  Made plan to gradually increase fluid intake  See AVS for ins  Instructed that if increase fluid causes shortness of breath or ankle swelling to hold and let us  know   Also discussed fall prevention    She will discuss all with her endocrinologist and cardiologist at next visit

## 2023-07-12 ENCOUNTER — Other Ambulatory Visit: Payer: Self-pay | Admitting: Family Medicine

## 2023-07-12 DIAGNOSIS — N318 Other neuromuscular dysfunction of bladder: Secondary | ICD-10-CM

## 2023-07-20 ENCOUNTER — Encounter: Payer: Self-pay | Admitting: Family Medicine

## 2023-07-21 NOTE — Telephone Encounter (Signed)
 I assume she meant affordable?   Will cc to Lillia Abed, I think she saw her before/ if so due for visit or need referral?  Thanks

## 2023-07-25 ENCOUNTER — Ambulatory Visit: Payer: Medicare HMO

## 2023-07-28 ENCOUNTER — Encounter: Payer: Self-pay | Admitting: Pharmacist

## 2023-07-28 NOTE — Progress Notes (Addendum)
 07/28/2023 Name: Morgan Hubbard MRN: [id number] DOB: 1935/05/04  Subjective  Chief Complaint  Patient presents with   Medication Access    Care Team: Primary Care Provider: Tower, Audrie Gallus, MD  Reason for visit: ?  Morgan Hubbard is a 89 y.o. female who presents today for a telephone visit with the pharmacist due to medication access concerns regarding their Brand-name medications.  Last Medication Assistance Visit: 06/05/23, 05/14/23, 04/09/23 Summary of recent related visits: 06/05/23: Her brand-name medications (Xarelto, Farxiga) copay should go back to her usual copay after initial deductible is met. Patient confirms she will plan to call [employer] to see what her medication deductible is this year.  Assisted her husband (also a patient of [facility]) with [employer] grant enrollment which will save them $200/month. May help with affordability of Farxiga through insurance this year.  05/14/23: [place] application submitted for Farxiga. Denied d/t income too high.  04/09/23: reviewed Xarelto PAP including 4% spend requirement.     Medication Access: ?  Prescription drug coverage: Payor: [employer] MEDICARE / Plan: [employer] MEDICARE HMO/PPO / Product Type: *No Product type* / .   Reports that all medications are not affordable.  [name] is  $143. (30 days) $578. (90 days)   [name] is $151.05 (30 days) $453.(90 days)   Xarelto is $150.63 (30 days) $453.  (90 days)  Ran out of the Xarelto and [name] yesterday. States she has cardiology visit scheduled tomorrow to discuss.   Current Patient Assistance: [employer] ([place], [place])  Patient lives in a household of 2 with her husband. They both receive SSI though her husband also receives income from a pension plan. She reports that their combined annual income is ~$73,000.   Medicare LIS Eligible: No  Couples Income Limit $2485/month; Exceeded   OAB: Notes bladder leaking while asleep. Pads are moistened when she awakes in the night.  Also notes trying to improve hydration. Recently started taking hydration packets (recommended by her friend). Purchased on [employer]. Takes these supplements occasionally. Nuun drink mix, Liquid IV packets.  CHF: Denies c/f swelling, orthopnea, or other fluid accumulation. Only started using electrolyte mixes a couple of days ago.   Assessment and Plan:   1. Medication Access Patient's Rx deductible this year is: $250 on Tier(s) 3,4,5 Patient's [address] [facility] Drug Formulary (Plan [phone]): [email or web address] $250 Rx Deductible  Tier 1 (preferred generic): $0 copay Tier 2 (generic): $10 copay Tier 3: 25% coinsurance (Xarelto, [name]) Tier 4: 26% coinsurance [place]) Patient is NOT eligible for Xarelto or Farxiga PAP due to income >300% [employer].  Brand alternatives would be the same cost (Jardiance, Eliquis). Not eligible for alternative PAP programs due to income) Patient is NOT eligible for copay cards due to [facility] insurance No grants currently open for AF/anticoagulants.   Approved for [employer] Cardiomyopathy Fund through 12/2023 which should reduce [name] cost to $0 Re-enroll after 12/25/2023 ; ID [id number] Called pharmacy and asked them to ensure they run Farxiga through [facility]. Copay deceased to $0.   2. Supplements Advised patient to use caution when it comes to hydration drink mixes. Liquid IV has >500 mg Na per drink, Nuun ~300 mg Na. Also some concern with supplementing regular electrolytes in the setting of renal dysfunction. She reports she has trouble drinking plain water though likes flavoring.  Consider flavor mix without high levels of sodium + made with natural sweeteners (eg Stevia) in the setting of CKD  Provided patient with direct callback number for any  questions/concerns.  Provided detailed update in MyChart message regarding medication copays.   No future  appointments.  Discussed w patient touching base toward end of the week s/p cardiology visit. Advised her to discuss all anticoag options with cardiology team.   Loree Fee, PharmD Clinical Pharmacist [facility] [phone]

## 2023-07-29 DIAGNOSIS — I10 Essential (primary) hypertension: Secondary | ICD-10-CM | POA: Diagnosis not present

## 2023-07-29 DIAGNOSIS — I428 Other cardiomyopathies: Secondary | ICD-10-CM | POA: Diagnosis not present

## 2023-08-11 ENCOUNTER — Other Ambulatory Visit (INDEPENDENT_AMBULATORY_CARE_PROVIDER_SITE_OTHER): Payer: Self-pay | Admitting: Pharmacist

## 2023-08-11 DIAGNOSIS — N318 Other neuromuscular dysfunction of bladder: Secondary | ICD-10-CM

## 2023-08-11 NOTE — Progress Notes (Signed)
 08/11/2023 Name: Morgan Hubbard MRN: [id number] DOB: Jul 07, 1934  Subjective  No chief complaint on file.   Reason for visit: ?  Morgan Hubbard is a 89 y.o. female  who presents for a medication access follow up visit.? Pertinent PMH also includes DM2, OAB, .  Care Team: Primary Care Provider: Tower, Audrie Gallus, MD Cardiology: Antonieta Iba, MD  Urology: Non-Epic provider Endo: Solum, Marlana Salvage, MD ([facility])  Medication Access/Adherence: Prescription drug coverage: Payor: AETNA MEDICARE / Plan: AETNA MEDICARE HMO/PPO / Product Type: *No Product type* / [name] is  $143. (30 days) $578. (90 days)   [name] is $151.05 (30 days) $453.(90 days)   Xarelto is $150.63 (30 days) $453.  (90 days)  History of Present Illness: ?  Confirms adherence to [name]. Feels her bladder symptoms remain frequent. Wears an absorbable pad. Notes possible improvement in degree of urgency from previous. Does not feel the the cost of this medications is necessarily worth the benefit.   Cannot recall previous hx of OAB medications. Does not recognize names when read out.   Reported Regimen: ?  [name] [name]  Medications Tried in the Past:  - Beta3 agonist:   mirbegron (transitioned to vibegron for less off-target BP side effects) - Anti-muscarinic:   fesoterodine (Toviaz)  solifenacin (Vesicare) 2013 (did not improve symptoms)  oxybutynin (2012-2013)  tolterodine (Detrol) worked well, transitioned to long acting    Detrol LA (12/2011) - symptoms worsened   Glaucoma: No Cognitive impairment: No     _______________________________________________  Objective    Review of Systems:? Limited in the setting of virtual visit  Constitutional:? No fever, chills or unintentional weight loss  Cardiovascular:? No chest pain or pressure, shortness of breath, dyspnea on exertion, orthopnea or LE edema  GI:? No nausea, vomiting, constipation, diarrhea, abdominal pain, dyspepsia, change in bowel habits   Endocrine:? No polyuria, polyphagia or blurred vision    Physical Examination:  Vitals:  Wt Readings from Last 3 Encounters:  07/09/23 147 lb 6 oz (66.8 kg)  07/04/23 145 lb 8.1 oz (66 kg)  04/23/23 147 lb (66.7 kg)   BP Readings from Last 3 Encounters:  07/09/23 (!) 104/52  07/04/23 110/60  04/23/23 138/64   Pulse Readings from Last 3 Encounters:  07/09/23 69  07/04/23 60  04/23/23 74     Labs:?  Lab Results  Component Value Date   HGBA1C 7.5 12/19/2022   HGBA1C 8.0 (H) 11/29/2021   HGBA1C endo 01/03/2019   GLUCOSE 135 (H) 07/04/2023   MICRALBCREAT 290 05/07/2023   MICRALBCREAT 781 11/02/2022   MICRALBCREAT 63.2 (H) 04/22/2016   CREATININE 1.50 (H) 07/04/2023   CREATININE 1.73 (H) 03/26/2023   CREATININE 1.67 (H) 01/01/2022   GFR 26.47 (L) 03/26/2023   GFR 27.85 (L) 01/01/2022   GFR 34.72 (L) 12/17/2021    Lab Results  Component Value Date   CHOL 113 03/26/2023   LDLCALC 48 03/26/2023   LDLCALC 41 02/18/2023   LDLCALC 46 06/10/2019   LDLDIRECT 46.0 06/12/2020   LDLDIRECT 77.0 04/22/2016   HDL 33.00 (L) 03/26/2023   TRIG 161.0 (H) 03/26/2023   TRIG 149 02/18/2023   TRIG 209.0 (H) 06/12/2020   ALT 20 07/04/2023   ALT 23 03/26/2023   AST 27 07/04/2023   AST 24 03/26/2023      Chemistry      Component Value Date/Time   NA 140 07/04/2023 1249   K 4.3 07/04/2023 1249   CL 105 07/04/2023 1249  CO2 26 07/04/2023 1249   BUN 26 (H) 07/04/2023 1249   CREATININE 1.50 (H) 07/04/2023 1249   CREATININE 1.63 (H) 12/07/2021 1337      Component Value Date/Time   CALCIUM 8.9 07/04/2023 1249   ALKPHOS 70 07/04/2023 1249   AST 27 07/04/2023 1249   ALT 20 07/04/2023 1249   BILITOT 0.9 07/04/2023 1249       The ASCVD Risk score (Arnett DK, et al., 2019) failed to calculate for the following reasons:   The 2019 ASCVD risk score is only valid for ages 54 to 19   Risk score cannot be calculated because patient has a medical history suggesting prior/existing  ASCVD  Assessment and Plan:    #OAB - Mixed Urge/Stress: While Beta 3 agonists tend to carry less side effects and are generally well tolerated, patient feels that cost is a barrier at this time. She is not confident that efficacy outweighs this cost. Aside from higher anti-cholinergic AE profile, we are also somewhat limited by patient's prior trial of most meds in this class seemingly without adequate symptom control.   Previous Medications: oxybutynin, tolterodine, tolterodine LA, solifenacin, fesoterodine, [name], [name].   Trospium is commonly covered by Medicare and has demonstrated a lower side effect profile with low CNS accumulation compared to older agents (i.e. oxybutynin). Beer's Criteria identifies highest risk patients as those with baseline cognitive dysfunction or dementia which is not a concern at this time. Renal function borderline 30, would opt for IR formulation once daily (night) to start given possible increase in systemic exposure/accumulation in renal impairment.  Consider trospium chloride 20 mg at bedtime in place of [name] Reviewed increased adverse effect profile compared to B3 agonist (eg, dry mouth, constipation, tachycardia, palpitations, dizziness/drowsiness) Assess symptom change and medication tolerance at four to six weeks.    #AF: CHADVASc = 8: elevated given pt hx: CHF, HTN, DM2, Age>75, Female sex, stroke/TIA1} Patient feels that one brand-name medication copay is feasible for the year. Discussed DOAC vs warfarin and patient preference remains with DOAC given intensive monitoring/interaction profile of warfarin. Discussed indication and rationale for anticoag in the setting of AF w elevated stroke risk based on CHADSVASC. Continue Xarelto     Pt to reach out if cost changes/becomes unfeasible    Future Appointments  Date Time Provider Department Center  04/26/2024  1:00 PM [facility] ANNUAL WELLNESS VISIT 1 [facility]    Loree Fee,  PharmD Clinical Pharmacist [facility] [phone]

## 2023-08-12 ENCOUNTER — Other Ambulatory Visit: Payer: Self-pay | Admitting: Pharmacist

## 2023-08-12 DIAGNOSIS — N3946 Mixed incontinence: Secondary | ICD-10-CM

## 2023-08-12 DIAGNOSIS — N318 Other neuromuscular dysfunction of bladder: Secondary | ICD-10-CM

## 2023-08-12 MED ORDER — TROSPIUM CHLORIDE 20 MG PO TABS: 20.0000 mg | ORAL_TABLET | Freq: Every day | ORAL | 2 refills | Status: DC

## 2023-08-14 ENCOUNTER — Encounter: Payer: Self-pay | Admitting: Pharmacist

## 2023-08-14 NOTE — Progress Notes (Signed)
 Phone Call: outgoing Reason for Call: Follow up on medication change  Summary of Call: No answer, left voicemail with callback number.  Called patient to ensure she was able to pick up her new medication at the pharmacy without issues. Medication: Trospium

## 2023-08-18 DIAGNOSIS — E1169 Type 2 diabetes mellitus with other specified complication: Secondary | ICD-10-CM | POA: Diagnosis not present

## 2023-08-18 DIAGNOSIS — E1129 Type 2 diabetes mellitus with other diabetic kidney complication: Secondary | ICD-10-CM | POA: Diagnosis not present

## 2023-08-18 DIAGNOSIS — Z794 Long term (current) use of insulin: Secondary | ICD-10-CM | POA: Diagnosis not present

## 2023-08-18 DIAGNOSIS — E1122 Type 2 diabetes mellitus with diabetic chronic kidney disease: Secondary | ICD-10-CM | POA: Diagnosis not present

## 2023-08-18 DIAGNOSIS — R809 Proteinuria, unspecified: Secondary | ICD-10-CM | POA: Diagnosis not present

## 2023-08-18 DIAGNOSIS — N184 Chronic kidney disease, stage 4 (severe): Secondary | ICD-10-CM | POA: Diagnosis not present

## 2023-08-18 DIAGNOSIS — E1165 Type 2 diabetes mellitus with hyperglycemia: Secondary | ICD-10-CM | POA: Diagnosis not present

## 2023-08-18 DIAGNOSIS — E785 Hyperlipidemia, unspecified: Secondary | ICD-10-CM | POA: Diagnosis not present

## 2023-08-21 DIAGNOSIS — E1169 Type 2 diabetes mellitus with other specified complication: Secondary | ICD-10-CM | POA: Diagnosis not present

## 2023-08-21 DIAGNOSIS — I10 Essential (primary) hypertension: Secondary | ICD-10-CM | POA: Diagnosis not present

## 2023-08-21 DIAGNOSIS — I428 Other cardiomyopathies: Secondary | ICD-10-CM | POA: Diagnosis not present

## 2023-08-21 DIAGNOSIS — Z4502 Encounter for adjustment and management of automatic implantable cardiac defibrillator: Secondary | ICD-10-CM | POA: Diagnosis not present

## 2023-08-21 DIAGNOSIS — E785 Hyperlipidemia, unspecified: Secondary | ICD-10-CM | POA: Diagnosis not present

## 2023-08-21 DIAGNOSIS — Z95810 Presence of automatic (implantable) cardiac defibrillator: Secondary | ICD-10-CM | POA: Diagnosis not present

## 2023-08-22 ENCOUNTER — Encounter: Payer: Self-pay | Admitting: Pharmacist

## 2023-08-22 NOTE — Progress Notes (Signed)
 Patient Assistance Program (PAP) Application   Manufacturer: [employer]    (Currently enrolled  -  dose change request) Medication(s):  Patient requested Candie Mile be canceled. Fiasp application not sent

## 2023-08-25 ENCOUNTER — Other Ambulatory Visit: Payer: Self-pay | Admitting: Pharmacist

## 2023-08-25 DIAGNOSIS — E1129 Type 2 diabetes mellitus with other diabetic kidney complication: Secondary | ICD-10-CM

## 2023-08-25 NOTE — Progress Notes (Unsigned)
 Form placed in PCP's inbox to sign before I fax

## 2023-08-25 NOTE — Progress Notes (Signed)
 Done and in IN box

## 2023-08-25 NOTE — Progress Notes (Signed)
 Brief Telephone Documentation Reason for Call: Patient left message regarding question for pharmacist (hypoglycemia on Fiasp)  Summary of Call: Called patient 08/25/23  Patient reports the following  FBG 177 - Took Fiasp 10 units and immediately ate breakfast (Raisin Bran cereal). Sugar dropped to 54 mg/dL. Took a while to get sugar back up despite treatments.  Felt the worst she has ever felt (nausea/vomiting/chills through the remainder of the day)  Patient denies having access to glucagon.   Follows with Endo (reports next follow up is in ~2 weeks). Encouraged continued communication with endo regarding her Fiasp dosing though she is adamant in holding the fiasp until follow up in 2 weeks. Has messaged Endo via MyChart.   Consider Adding Zegalogue (Novo's dasiglucagon autoinjector) to her Novo application for safety and also piece of mind as she is very apprehensive about ever using mealtime insulin again.   We discussed glucagon/dasiglucagon and she is interested in having a pen for emergency use if covered.  Will discuss with PCP.  If agreeable, will complete Novo Refill Form for Zegalogue autoinjector   Follow Up: Patient given direct line for further questions/concerns.  Loree Fee, PharmD Clinical Pharmacist [facility] [phone]

## 2023-08-26 NOTE — Progress Notes (Unsigned)
 New pt assistance form for(Zegalogue) printed and placed in PCP inbox for review. FYI to [place] also

## 2023-08-26 NOTE — Progress Notes (Signed)
 Done and in IN box  Thanks for the help

## 2023-08-26 NOTE — Progress Notes (Unsigned)
Form faxed and copy sent to scanning.

## 2023-08-26 NOTE — Progress Notes (Unsigned)
 Per Morgan Abed do not fax form pt doesn't want to proceed with injections. Form shred   FYI to [employer]

## 2023-08-27 DIAGNOSIS — H26491 Other secondary cataract, right eye: Secondary | ICD-10-CM | POA: Diagnosis not present

## 2023-08-27 DIAGNOSIS — H35371 Puckering of macula, right eye: Secondary | ICD-10-CM | POA: Diagnosis not present

## 2023-08-27 DIAGNOSIS — Z961 Presence of intraocular lens: Secondary | ICD-10-CM | POA: Diagnosis not present

## 2023-08-28 DIAGNOSIS — D225 Melanocytic nevi of trunk: Secondary | ICD-10-CM | POA: Diagnosis not present

## 2023-08-28 DIAGNOSIS — D2272 Melanocytic nevi of left lower limb, including hip: Secondary | ICD-10-CM | POA: Diagnosis not present

## 2023-08-28 DIAGNOSIS — L650 Telogen effluvium: Secondary | ICD-10-CM | POA: Diagnosis not present

## 2023-08-28 DIAGNOSIS — Z85828 Personal history of other malignant neoplasm of skin: Secondary | ICD-10-CM | POA: Diagnosis not present

## 2023-08-28 DIAGNOSIS — L538 Other specified erythematous conditions: Secondary | ICD-10-CM | POA: Diagnosis not present

## 2023-08-28 DIAGNOSIS — L820 Inflamed seborrheic keratosis: Secondary | ICD-10-CM | POA: Diagnosis not present

## 2023-08-28 DIAGNOSIS — D2261 Melanocytic nevi of right upper limb, including shoulder: Secondary | ICD-10-CM | POA: Diagnosis not present

## 2023-08-28 DIAGNOSIS — D2262 Melanocytic nevi of left upper limb, including shoulder: Secondary | ICD-10-CM | POA: Diagnosis not present

## 2023-09-01 ENCOUNTER — Other Ambulatory Visit: Payer: Self-pay | Admitting: Pharmacist

## 2023-09-01 DIAGNOSIS — H26491 Other secondary cataract, right eye: Secondary | ICD-10-CM | POA: Diagnosis not present

## 2023-09-01 NOTE — Progress Notes (Signed)
 Brief Telephone Documentation Reason for Call: Patient left message regarding question about medication side effects  Summary of Call: Patient reports some new side effects with her trospium medication  Is noting some anticholinergic effects even at lowest possible dose. Dry mouth, some blurring of the vision.  Feels she is urinating less frequently, however when she does urinate, finds her stream is weaker than she is used to.   We did again discuss the two broad classes of OAB treatments (antimuscarinic vs beta3 agonist) with strong preference for B3 agonist medications due to preferable adverse effect profile though cost is somewhat limiting). Is on lowest dose if immediate release trospium. Effects should not last into the next day though if she is having prolonged side effects, would opt for alternative.  Follow Up: Call was short due to patient having a medical appointment.  Advised her to hold the trospium for tonight and we will see how her urinary symptoms/anticholinergic side effects do tomorrow.  Patient given direct line for further questions/concerns.  Loree Fee, PharmD Clinical Pharmacist [facility] [phone]

## 2023-09-03 ENCOUNTER — Other Ambulatory Visit: Admitting: Pharmacist

## 2023-09-03 DIAGNOSIS — N318 Other neuromuscular dysfunction of bladder: Secondary | ICD-10-CM

## 2023-09-03 NOTE — Progress Notes (Signed)
 09/03/2023 Name: Morgan Hubbard MRN: [id number] DOB: July 03, 1934  Subjective  Chief Complaint  Patient presents with   Over Active Bladder   Reason for visit: ?  Morgan Hubbard is a 89 y.o. female  who presents for a follow up visit.? Pertinent PMH also includes DM2, OAB.  Care Team: Primary Care Provider: Tower, Audrie Gallus, MD Cardiology: Morgan Iba, MD  Urology: Non-Epic provider Endo: Solum, Marlana Salvage, MD ([facility])  Medication Access/Adherence: Prescription drug coverage: Payor: AETNA MEDICARE / Plan: AETNA MEDICARE HMO/PPO / Product Type: *No Product type* / .   History of Present Illness: ?  Since last visit, patient started lowest dose tolterodine without concerns until last week when she started to note some anticholinergic side effects (some blurring of the vision, dry mouth, and a weaker urine stream). Denies dizziness. Still urinating though less frequently. Normal color.   Confirms holding tolterodine over the past few days as instructed. Notes resolution of her side effects. Not having increased frequency yet. Does feel voiding has become easier.   Reported Regimen: ?  Tolterodone 20 mg hs  Medications Tried in the Past:  - Beta3 agonist:   mirbegron (transitioned to vibegron for less off-target BP side effects)  [name] (vibegron) is $143. (30 days) - Anti-muscarinic:   fesoterodine (Toviaz)  solifenacin (Vesicare) 2013 (did not improve symptoms)  oxybutynin (2012-2013)  tolterodine (Detrol) worked well, transitioned to long acting    Detrol LA (12/2011) - symptoms worsened   Glaucoma: No Cognitive impairment: No   Diabetes Reports she is still not using fiasp due to her hypoglycemia after her first injection.  Notes fasting sugars are always <130 mg/dL.  Sugars throughout the day are "pretty high". Reports highest is usually mid/u[[er 200s.     _______________________________________________  Objective    Review of Systems:? Limited in the setting of virtual  visit    Physical Examination:  Vitals:  Wt Readings from Last 3 Encounters:  07/09/23 147 lb 6 oz (66.8 kg)  07/04/23 145 lb 8.1 oz (66 kg)  04/23/23 147 lb (66.7 kg)   BP Readings from Last 3 Encounters:  07/09/23 (!) 104/52  07/04/23 110/60  04/23/23 138/64   Pulse Readings from Last 3 Encounters:  07/09/23 69  07/04/23 60  04/23/23 74     Labs:?  Lab Results  Component Value Date   HGBA1C 7.5 12/19/2022   HGBA1C 8.0 (H) 11/29/2021   HGBA1C endo 01/03/2019   GLUCOSE 135 (H) 07/04/2023   MICRALBCREAT 290 05/07/2023   MICRALBCREAT 781 11/02/2022   MICRALBCREAT 63.2 (H) 04/22/2016   CREATININE 1.50 (H) 07/04/2023   CREATININE 1.73 (H) 03/26/2023   CREATININE 1.67 (H) 01/01/2022   GFR 26.47 (L) 03/26/2023   GFR 27.85 (L) 01/01/2022   GFR 34.72 (L) 12/17/2021    Lab Results  Component Value Date   CHOL 113 03/26/2023   LDLCALC 48 03/26/2023   LDLCALC 41 02/18/2023   LDLCALC 46 06/10/2019   LDLDIRECT 46.0 06/12/2020   LDLDIRECT 77.0 04/22/2016   HDL 33.00 (L) 03/26/2023   TRIG 161.0 (H) 03/26/2023   TRIG 149 02/18/2023   TRIG 209.0 (H) 06/12/2020   ALT 20 07/04/2023   ALT 23 03/26/2023   AST 27 07/04/2023   AST 24 03/26/2023      Chemistry      Component Value Date/Time   NA 140 07/04/2023 1249   K 4.3 07/04/2023 1249   CL 105 07/04/2023 1249   CO2 26 07/04/2023 1249  BUN 26 (H) 07/04/2023 1249   CREATININE 1.50 (H) 07/04/2023 1249   CREATININE 1.63 (H) 12/07/2021 1337      Component Value Date/Time   CALCIUM 8.9 07/04/2023 1249   ALKPHOS 70 07/04/2023 1249   AST 27 07/04/2023 1249   ALT 20 07/04/2023 1249   BILITOT 0.9 07/04/2023 1249       The ASCVD Risk score (Arnett DK, et al., 2019) failed to calculate for the following reasons:   The 2019 ASCVD risk score is only valid for ages 24 to 47   Risk score cannot be calculated because patient has a medical history suggesting prior/existing ASCVD  Assessment and Plan:    #OAB - Mixed  Urge/Stress: Starting to see anticholinergic side effects even on lowest dose of IR tolterodine once daily (dose reduced for renal function). We discussed goals of treatment. Reviewed options of re-trying medications she has taken in the past though did not seem to benefit her significantly at the time. [name] remains expensive and patient was unsure if she was receiving benefit compared to baseline given she has been on OAB medication for >10 years.   Previous Medications: oxybutynin, tolterodine, tolterodine LA, solifenacin, fesoterodine, [name], [name], trospium IR once daily. With shared decision making, it was decided to continue to hold OAB medicines for the next few days/week. If frequency becomes burdensome, cost of [name] will be more justified/relative efficacy will be come more clear.   #Diabetes followed by endo. Recently prescribed Candie Mile though stopped taking completely after her first dose d/t hypoglycemia event. She reports fasting sugars are well controlled <130, though afternoon sugars are often in mid-200s mg/dL. Recommended patient reach out to endocrinologist to let them know she has not been using fiasp.  May consider Novolog at low starting dose per patient significant fear of hypoglycemia (adamant about stopping fiasp).  If Endo agreeable, will add Novolog to Novo PAP application.    Follow up: Phone check in with pharmacist 1-2 weeks  Patient given direct line if symptoms/concerns prior to this   Future Appointments  Date Time Provider Department Center  04/27/2024  9:30 AM [facility] ANNUAL WELLNESS VISIT 1 [facility]   Loree Fee, PharmD Clinical Pharmacist [facility] [phone]

## 2023-09-08 ENCOUNTER — Other Ambulatory Visit: Payer: Self-pay | Admitting: Cardiovascular Disease

## 2023-09-12 NOTE — Telephone Encounter (Signed)
Patient is returning RN's call. Please advise. 

## 2023-09-12 NOTE — Telephone Encounter (Signed)
 Called patient and left message for call back.

## 2023-09-24 DIAGNOSIS — H6123 Impacted cerumen, bilateral: Secondary | ICD-10-CM | POA: Diagnosis not present

## 2023-09-24 DIAGNOSIS — H61031 Chondritis of right external ear: Secondary | ICD-10-CM | POA: Diagnosis not present

## 2023-09-26 DIAGNOSIS — H04123 Dry eye syndrome of bilateral lacrimal glands: Secondary | ICD-10-CM | POA: Diagnosis not present

## 2023-09-26 DIAGNOSIS — H26491 Other secondary cataract, right eye: Secondary | ICD-10-CM | POA: Diagnosis not present

## 2023-10-07 ENCOUNTER — Telehealth: Payer: Self-pay

## 2023-10-07 NOTE — Telephone Encounter (Signed)
 Received 3 pen of Tresiba as well as 4 of Ozempic. Placed in fridge for patient pick up. Patient has been informed she can pick up during business hours.

## 2023-10-08 ENCOUNTER — Encounter: Payer: Self-pay | Admitting: Internal Medicine

## 2023-10-08 ENCOUNTER — Ambulatory Visit: Admitting: Nurse Practitioner

## 2023-10-08 ENCOUNTER — Ambulatory Visit: Admitting: Internal Medicine

## 2023-10-08 ENCOUNTER — Ambulatory Visit

## 2023-10-08 VITALS — BP 110/60 | HR 66 | Temp 98.5°F | Ht 60.0 in | Wt 146.0 lb

## 2023-10-08 DIAGNOSIS — L299 Pruritus, unspecified: Secondary | ICD-10-CM | POA: Diagnosis not present

## 2023-10-08 LAB — RENAL FUNCTION PANEL
Albumin: 3.7 g/dL (ref 3.5–5.2)
BUN: 22 mg/dL (ref 6–23)
CO2: 33 meq/L — ABNORMAL HIGH (ref 19–32)
Calcium: 9.2 mg/dL (ref 8.4–10.5)
Chloride: 102 meq/L (ref 96–112)
Creatinine, Ser: 1.73 mg/dL — ABNORMAL HIGH (ref 0.40–1.20)
GFR: 26.37 mL/min — ABNORMAL LOW (ref >60.00)
Glucose, Bld: 137 mg/dL — ABNORMAL HIGH (ref 70–99)
Phosphorus: 3.7 mg/dL (ref 2.3–4.6)
Potassium: 5.2 meq/L — ABNORMAL HIGH (ref 3.5–5.1)
Sodium: 139 meq/L (ref 135–145)

## 2023-10-08 LAB — HEPATIC FUNCTION PANEL
ALT: 21 U/L (ref 0–35)
AST: 29 U/L (ref 0–37)
Albumin: 3.7 g/dL (ref 3.5–5.2)
Alkaline Phosphatase: 79 U/L (ref 39–117)
Bilirubin, Direct: 0.1 mg/dL (ref 0.0–0.3)
Total Bilirubin: 0.5 mg/dL (ref 0.2–1.2)
Total Protein: 6.1 g/dL (ref 6.0–8.3)

## 2023-10-08 LAB — CBC
HCT: 41.7 % (ref 36.0–46.0)
Hemoglobin: 13.9 g/dL (ref 12.0–15.0)
MCHC: 33.3 g/dL (ref 30.0–36.0)
MCV: 89.5 fl (ref 78.0–100.0)
Platelets: 170 10*3/uL (ref 150.0–400.0)
RBC: 4.66 Mil/uL (ref 3.87–5.11)
RDW: 13.4 % (ref 11.5–15.5)
WBC: 6.1 10*3/uL (ref 4.0–10.5)

## 2023-10-08 MED ORDER — TRIAMCINOLONE ACETONIDE 0.1 % EX CREA: 1.0000 | TOPICAL_CREAM | Freq: Two times a day (BID) | CUTANEOUS | 1 refills | Status: AC | PRN

## 2023-10-08 NOTE — Progress Notes (Signed)
 Subjective:    Patient ID: Morgan Hubbard, female    DOB: 1934-09-22, 89 y.o.   MRN: [id number]  HPI Here due to itching  Having bad itching--"driving me crazy" Arms/legs, etc No rash--other than slight red area on inside distal right arm Hands really bad this morning  No new medication Did use a new cream in shower recently--but now using [place] (not before)  No illness No fever  Tried gabapentin  last night--didn't help Topical calamine also didn't help  Current Outpatient Medications on File Prior to Visit  Medication Sig Dispense Refill   albuterol  (VENTOLIN  HFA) 108 (90 Base) MCG/ACT inhaler Inhale 2 puffs into the lungs every 6 (six) hours as needed for wheezing or shortness of breath (cough). 8 g 1   amLODipine  (NORVASC ) 2.5 MG tablet Take 2.5 mg by mouth at bedtime.     atorvastatin  (LIPITOR) 80 MG tablet Take 1 tablet (80 mg total) by mouth daily. 90 tablet 3   benzonatate  (TESSALON ) 200 MG capsule Take 1 capsule (200 mg total) by mouth 3 (three) times daily as needed for cough. 40 capsule 1   BIOTIN 5000 PO Take 5,000 mg by mouth daily.     carvedilol  (COREG ) 12.5 MG tablet Take 1 tablet (12.5 mg total) by mouth 2 (two) times daily with a meal. 180 tablet 1   cetirizine (ZYRTEC) 10 MG tablet Take 10 mg by mouth daily.     clotrimazole-betamethasone (LOTRISONE) cream Apply 1 application topically as needed (groin).      Continuous Glucose Sensor (FREESTYLE LIBRE 3 SENSOR) MISC Use to check glucose continuously 6 each 3   dapagliflozin propanediol (FARXIGA) 5 MG TABS tablet Take 5 mg by mouth daily.     famotidine  (PEPCID ) 20 MG tablet Take 1 tablet by mouth once daily 90 tablet 1   gabapentin  (NEURONTIN ) 300 MG capsule TAKE 1 CAPSULE BY MOUTH THREE TIMES DAILY 270 capsule 2   hydrALAZINE  (APRESOLINE ) 50 MG tablet Take 1 tablet (50 mg total) by mouth 3 (three) times daily. 270 tablet 3   hydrocortisone  2.5 % cream Apply topically 3 (three) times daily as needed. 28 g 3    insulin  degludec (TRESIBA) 100 UNIT/ML FlexTouch Pen Inject 26 Units into the skin at bedtime.     isosorbide  mononitrate (IMDUR ) 60 MG 24 hr tablet Take 1 tablet (60 mg total) by mouth in the morning and at bedtime. 180 tablet 3   LORazepam  (ATIVAN ) 0.5 MG tablet Take 1 tablet by mouth every 8 (eight) hours as needed.     nystatin cream (MYCOSTATIN) Apply 1 application topically 2 (two) times daily as needed (irritation.).      pantoprazole  (PROTONIX ) 40 MG tablet Take 1 tablet by mouth once daily 90 tablet 1   Respiratory Therapy Supplies (NEBULIZER/TUBING/MOUTHPIECE) KIT 1 each by Does not apply route 3 (three) times daily as needed. 1 kit 0   Rivaroxaban (XARELTO) 15 MG TABS tablet Take 15 mg by mouth daily with supper.     Semaglutide, 2 MG/DOSE, (OZEMPIC, 2 MG/DOSE,) 8 MG/3ML SOPN Inject 2 mg into the skin once a week.     traMADol  (ULTRAM ) 50 MG tablet Take 1-2 tablets (50-100 mg total) by mouth every 6 (six) hours as needed for severe pain (pain score 7-10). 60 tablet 0   vitamin B-12 (CYANOCOBALAMIN ) 1000 MCG tablet Take 500 mcg by mouth daily.     No current facility-administered medications on file prior to visit.    Allergies  Allergen Reactions  Ace Inhibitors Swelling    REACTION: tongue swelling   Lisinopril Swelling    Swelling of tongue   Insulin  Detemir Itching   Nsaids Other (See Comments)    Renal issues   Codeine Nausea Only   Hydrochlorothiazide Other (See Comments)    Dizziness/funny feeling   Tylenol  [Acetaminophen ] Other (See Comments)    Fatty liver - use with caution    Past Medical History:  Diagnosis Date   Arthritis    "hands" (01/13/2014)   Basal cell carcinoma 01/2014   "bridge of nose"   Cardiac LV ejection fraction >40%    "it was 43 last year" (01/13/2014)   Chronic lower back pain    CKD (chronic kidney disease), stage III (HCC)    acute on chronic stage III/notes 06/10/2018   Colon polyps    Expressive aphasia    "3 times in the last week"  (01/13/2014)   GERD (gastroesophageal reflux disease)    Goiter past remote   treated with RI   Hyperlipidemia    Hyperpotassemia    Hypertension    Hypertonicity of bladder    Inflammatory and toxic neuropathy, unspecified    LBBB (left bundle branch block)    Migraine    "stopped many years ago" (01/13/2014)   Mini stroke 01/2014   Other primary cardiomyopathies    Overactive bladder    Presence of combination internal cardiac defibrillator (ICD) and pacemaker    Reflex sympathetic dystrophy, unspecified    Shingles    Shortness of breath    ambulation   Stroke (HCC)    Thyroid  disease    Type II diabetes mellitus (HCC)    Ulcer    Urine incontinence    Vertigo    hx of    Past Surgical History:  Procedure Laterality Date   BACK SURGERY     BI-VENTRICULAR PACEMAKER INSERTION (CRT-P)  10/2014   [facility]   BREAST CYST EXCISION Left 1959   CARDIAC CATHETERIZATION  "several"   [place]   CATARACT EXTRACTION W/ INTRAOCULAR LENS IMPLANT Left ~ 2005   several eye injections   CATARACT EXTRACTION W/PHACO Right 05/04/2020   Procedure: CATARACT EXTRACTION PHACO AND INTRAOCULAR LENS PLACEMENT (IOC) RIGHT DIABETIC;  Surgeon: Clair Crews, MD;  Location: [facility] ORS;  Service: Ophthalmology;  Laterality: Right;  US  00:58.5 CDE 6.70 Fluid Pack [place] # [id number]   CHOLECYSTECTOMY N/A 06/13/2018   Procedure: LAPAROSCOPIC CHOLECYSTECTOMY;  Surgeon: Dareen Ebbing, MD;  Location: [facility] OR;  Service: General;  Laterality: N/A;   COLONOSCOPY  7/12   normal (hx of polyps in past)    CT SCAN  3/12   outside hosp- lung nodule and gallstones   ERCP N/A 06/12/2018   Procedure: ENDOSCOPIC RETROGRADE CHOLANGIOPANCREATOGRAPHY (ERCP);  Surgeon: Alvis Jourdain, MD;  Location: [facility] ENDOSCOPY;  Service: Endoscopy;  Laterality: N/A;   FINGER SURGERY Left 1959 & 1976   "knot on index"   KIDNEY DONATION Left 1989   LUMBAR LAMINECTOMY/DECOMPRESSION MICRODISCECTOMY  1999   LUMBAR LAMINECTOMY/DECOMPRESSION  MICRODISCECTOMY  01/31/2012   Procedure: LUMBAR LAMINECTOMY/DECOMPRESSION MICRODISCECTOMY 2 LEVELS;  Surgeon: Isadora Mar, MD;  Location: [facility] NEURO ORS;  Service: Neurosurgery;  Laterality: N/A;  Thoracic twelve-lumbar one, lumbar one-two laminectomy    POSTERIOR LAMINECTOMY / DECOMPRESSION CERVICAL SPINE  1999   REMOVAL OF STONES  06/12/2018   Procedure: REMOVAL OF STONES;  Surgeon: Alvis Jourdain, MD;  Location: [facility] ENDOSCOPY;  Service: Endoscopy;;   SPHINCTEROTOMY  06/12/2018   Procedure: [address];  Surgeon: Alvis Jourdain,  MD;  Location: [facility] ENDOSCOPY;  Service: Endoscopy;;   TUBAL LIGATION  1976   UPPER GASTROINTESTINAL ENDOSCOPY      Family History  Problem Relation Age of Onset   Arthritis Mother    Cancer Mother        uterine and mouth   Hyperlipidemia Mother    Stroke Mother    Hypertension Mother    Alcohol abuse Father    Diabetes Father    Cancer Sister        breast   Diabetes Sister    Hyperlipidemia Sister    Heart disease Sister    Hypertension Sister    Diabetes Sister    Cancer Brother        lung cancer   Kidney disease Brother    Diabetes Brother    Hyperlipidemia Brother    Kidney failure Brother    Diabetes Daughter    Heart attack Daughter    Addison's disease Other     Social History   Socioeconomic History   Marital status: Married    Spouse name: Not on file   Number of children: 2   Years of education: Not on file   Highest education level: Not on file  Occupational History   Occupation: retired - Freight forwarder   Tobacco Use   Smoking status: Never   Smokeless tobacco: Never  Vaping Use   Vaping status: Never Used  Substance and Sexual Activity   Alcohol use: No    Alcohol/week: 0.0 standard drinks of alcohol   Drug use: No   Sexual activity: Yes  Other Topics Concern   Not on file  Social History Narrative   Married 2nd time   Daughter is Therapist, occupational    Social Drivers of Health   Financial Resource Strain: Low Risk   (05/31/2022)   Overall Financial Resource Strain (CARDIA)    Difficulty of Paying Living Expenses: Not hard at all  Food Insecurity: No Food Insecurity (05/31/2022)   Hunger Vital Sign    Worried About Running Out of Food in the Last Year: Never true    Ran Out of Food in the Last Year: Never true  Transportation Needs: No Transportation Needs (05/31/2022)   PRAPARE - Administrator, Civil Service (Medical): No    Lack of Transportation (Non-Medical): No  Physical Activity: Inactive (05/30/2021)   Exercise Vital Sign    Days of Exercise per Week: 0 days    Minutes of Exercise per Session: 0 min  Stress: No Stress Concern Present (05/31/2022)   [employer] of Occupational Health - Occupational Stress Questionnaire    Feeling of Stress : Not at all  Social Connections: Moderately Isolated (05/31/2022)   Social Connection and Isolation Panel [NHANES]    Frequency of Communication with Friends and Family: More than three times a week    Frequency of Social Gatherings with Friends and Family: Three times a week    Attends Religious Services: Never    Active Member of Clubs or Organizations: No    Attends Banker Meetings: Never    Marital Status: Married  Catering manager Violence: Not At Risk (05/31/2022)   Humiliation, Afraid, Rape, and Kick questionnaire    Fear of Current or Ex-Partner: No    Emotionally Abused: No    Physically Abused: No    Sexually Abused: No   Review of Systems Does take daily zyrtec for allergies Going camping at [facility]     Objective:  Physical Exam Skin:    Comments: Slight red area on inner right arm---almost looks urticarial but very localized No other lesions            Assessment & Plan:

## 2023-10-08 NOTE — Assessment & Plan Note (Signed)
 Unclear etiology Will check labs --especially kidney/liver Can continue calamine Will add triamcinolone  cream for prn On cetirizine already Use moisturizing soap

## 2023-10-17 DIAGNOSIS — Z961 Presence of intraocular lens: Secondary | ICD-10-CM | POA: Diagnosis not present

## 2023-10-17 DIAGNOSIS — H04123 Dry eye syndrome of bilateral lacrimal glands: Secondary | ICD-10-CM | POA: Diagnosis not present

## 2023-10-17 DIAGNOSIS — H26491 Other secondary cataract, right eye: Secondary | ICD-10-CM | POA: Diagnosis not present

## 2023-11-17 DIAGNOSIS — K219 Gastro-esophageal reflux disease without esophagitis: Secondary | ICD-10-CM | POA: Diagnosis not present

## 2023-11-17 DIAGNOSIS — R809 Proteinuria, unspecified: Secondary | ICD-10-CM | POA: Diagnosis not present

## 2023-11-17 DIAGNOSIS — E875 Hyperkalemia: Secondary | ICD-10-CM | POA: Diagnosis not present

## 2023-11-17 DIAGNOSIS — E1122 Type 2 diabetes mellitus with diabetic chronic kidney disease: Secondary | ICD-10-CM | POA: Diagnosis not present

## 2023-11-17 DIAGNOSIS — N184 Chronic kidney disease, stage 4 (severe): Secondary | ICD-10-CM | POA: Diagnosis not present

## 2023-11-17 DIAGNOSIS — D649 Anemia, unspecified: Secondary | ICD-10-CM | POA: Diagnosis not present

## 2023-11-17 DIAGNOSIS — N3281 Overactive bladder: Secondary | ICD-10-CM | POA: Diagnosis not present

## 2023-11-17 DIAGNOSIS — I129 Hypertensive chronic kidney disease with stage 1 through stage 4 chronic kidney disease, or unspecified chronic kidney disease: Secondary | ICD-10-CM | POA: Diagnosis not present

## 2023-11-20 DIAGNOSIS — R809 Proteinuria, unspecified: Secondary | ICD-10-CM | POA: Diagnosis not present

## 2023-11-20 DIAGNOSIS — E785 Hyperlipidemia, unspecified: Secondary | ICD-10-CM | POA: Diagnosis not present

## 2023-11-20 DIAGNOSIS — E1122 Type 2 diabetes mellitus with diabetic chronic kidney disease: Secondary | ICD-10-CM | POA: Diagnosis not present

## 2023-11-20 DIAGNOSIS — E1165 Type 2 diabetes mellitus with hyperglycemia: Secondary | ICD-10-CM | POA: Diagnosis not present

## 2023-11-20 DIAGNOSIS — E1129 Type 2 diabetes mellitus with other diabetic kidney complication: Secondary | ICD-10-CM | POA: Diagnosis not present

## 2023-11-20 DIAGNOSIS — N184 Chronic kidney disease, stage 4 (severe): Secondary | ICD-10-CM | POA: Diagnosis not present

## 2023-11-20 DIAGNOSIS — Z794 Long term (current) use of insulin: Secondary | ICD-10-CM | POA: Diagnosis not present

## 2023-11-20 DIAGNOSIS — I10 Essential (primary) hypertension: Secondary | ICD-10-CM | POA: Diagnosis not present

## 2023-11-20 DIAGNOSIS — E1169 Type 2 diabetes mellitus with other specified complication: Secondary | ICD-10-CM | POA: Diagnosis not present

## 2023-11-21 DIAGNOSIS — E1169 Type 2 diabetes mellitus with other specified complication: Secondary | ICD-10-CM | POA: Diagnosis not present

## 2023-11-21 DIAGNOSIS — I428 Other cardiomyopathies: Secondary | ICD-10-CM | POA: Diagnosis not present

## 2023-11-21 DIAGNOSIS — Z95810 Presence of automatic (implantable) cardiac defibrillator: Secondary | ICD-10-CM | POA: Diagnosis not present

## 2023-11-21 DIAGNOSIS — E785 Hyperlipidemia, unspecified: Secondary | ICD-10-CM | POA: Diagnosis not present

## 2023-11-21 DIAGNOSIS — I10 Essential (primary) hypertension: Secondary | ICD-10-CM | POA: Diagnosis not present

## 2023-11-21 DIAGNOSIS — Z4502 Encounter for adjustment and management of automatic implantable cardiac defibrillator: Secondary | ICD-10-CM | POA: Diagnosis not present

## 2023-12-16 ENCOUNTER — Ambulatory Visit
Admission: RE | Admit: 2023-12-16 | Discharge: 2023-12-16 | Disposition: A | Discharge status: Home / Self Care | Source: Ambulatory Visit | Attending: Family Medicine | Admitting: Family Medicine

## 2023-12-16 ENCOUNTER — Other Ambulatory Visit: Payer: Self-pay | Admitting: Pharmacist

## 2023-12-16 ENCOUNTER — Ambulatory Visit: Payer: Self-pay | Admitting: Family Medicine

## 2023-12-16 DIAGNOSIS — E2839 Other primary ovarian failure: Secondary | ICD-10-CM | POA: Insufficient documentation

## 2023-12-16 DIAGNOSIS — M810 Age-related osteoporosis without current pathological fracture: Secondary | ICD-10-CM

## 2023-12-16 NOTE — Progress Notes (Signed)
 Brief Telephone Documentation Reason for Call: Patient called requesting to speak with pharmacist  Summary of Call: Patient states they need a new Libre 3 reader device. Notes the device does turn on and shows menu options but there is nothing under View glucose.   During our troubleshooting, patient has realized that her sensor is no longer on her arm. Must have fallen off which is the reason the reader is having trouble connecting.   She does have replacement sensor at home. Will call if any further issues getting sensor connected.   Follow Up: Patient given direct line for further questions/concerns.  [name], PharmD Clinical Pharmacist [facility] [phone]

## 2023-12-29 ENCOUNTER — Encounter: Payer: Self-pay | Admitting: Internal Medicine

## 2023-12-29 ENCOUNTER — Ambulatory Visit: Payer: Self-pay

## 2023-12-29 ENCOUNTER — Ambulatory Visit (INDEPENDENT_AMBULATORY_CARE_PROVIDER_SITE_OTHER): Admitting: Internal Medicine

## 2023-12-29 VITALS — BP 118/68 | HR 63 | Temp 98.7°F | Ht 60.0 in | Wt 143.0 lb

## 2023-12-29 DIAGNOSIS — Z23 Encounter for immunization: Secondary | ICD-10-CM | POA: Diagnosis not present

## 2023-12-29 DIAGNOSIS — S81812A Laceration without foreign body, left lower leg, initial encounter: Secondary | ICD-10-CM | POA: Diagnosis not present

## 2023-12-29 MED ORDER — CEPHALEXIN 500 MG PO CAPS: 500.0000 mg | ORAL_CAPSULE | Freq: Three times a day (TID) | ORAL | 1 refills | Status: DC

## 2023-12-29 NOTE — Assessment & Plan Note (Addendum)
 With secondary infection Continue to clean with soap and water Neosporin okay Cephalexin  500 tid x 5 days Would change to doxy if not better in 2 days Update Td

## 2023-12-29 NOTE — Telephone Encounter (Signed)
 FYI Only or Action Required?: FYI only for provider.  Patient was last seen in primary care on 10/08/2023 by Jimmy Charlie FERNS, MD.  Called Nurse Triage reporting wound.  Symptoms began a week ago.  Interventions attempted: Nothing.  Symptoms are: gradually worsening.  Triage Disposition: See Physician Within 24 Hours  Patient/caregiver understands and will follow disposition?: Yes    Copied from CRM #[id number]. Topic: Clinical - Red Word Triage >> Dec 29, 2023 11:12 AM Morgan Hubbard wrote: Red Word that prompted transfer to Nurse Triage: Patient states she is diabetic and injured her leg over a week ago and it is not healing. Patient states it is swollen, very painful, and red. Reason for Disposition  [1] MODERATE pain (e.g., interferes with normal activities, limping) AND [2] high-risk adult (e.g., age > 60 years, osteoporosis, chronic steroid use)  Answer Assessment - Initial Assessment Questions 1. MECHANISM: How did the injury happen? (e.g., twisting injury, direct blow)      When opening car door, it caused a gash out of her leg   2. ONSET: When did the injury happen? (e.g., minutes, hours ago)      A week ago  3. LOCATION: Where is the injury located?      Left lower leg  4. APPEARANCE of INJURY: What does the injury look like?  (e.g., deformity of leg)     Patient states gash is still present and has dried blood on it. The leg has swollen around it with redness.  5. SEVERITY: Can you put weight on that leg? Can you walk?      Patient can still walk and put weight on this leg  6. SIZE: For cuts, bruises, or swelling, ask: How large is it? (e.g., inches or centimeters)      Maybe an inch  7. PAIN: Is there pain? If Yes, ask: How bad is the pain?   What does it keep you from doing? (Scale 0-10; or none, mild, moderate, severe)     Patient states she cannot lay on it but rates a 5/10  8. TETANUS: For any breaks in the skin, ask: When was your last tetanus  booster?     Unable to determine, patient states she has not had a recent booster  9. OTHER SYMPTOMS: Do you have any other symptoms?      No fever or weakness  Protocols used: Leg Injury-A-AH

## 2023-12-29 NOTE — Telephone Encounter (Signed)
 Thanks to Dr Jimmy for seeing her

## 2023-12-29 NOTE — Addendum Note (Signed)
 Addended by: KALLIE CLOTILDA SQUIBB on: 12/29/2023 03:27 PM   Modules accepted: Orders

## 2023-12-29 NOTE — Progress Notes (Signed)
 Subjective:    Patient ID: Morgan Hubbard, female    DOB: 1934-10-20, 89 y.o.   MRN: [id number]  HPI Here for a check on a left leg wound With husband Hit car door on lower leg 7/22 Bled a lot  Redness and some swelling Does throb some and is painful Wound up being deeper than she thought  Had kept it clean and using neosporin  Current Outpatient Medications on File Prior to Visit  Medication Sig Dispense Refill   amLODipine  (NORVASC ) 2.5 MG tablet Take 2.5 mg by mouth at bedtime.     atorvastatin  (LIPITOR) 80 MG tablet Take 1 tablet (80 mg total) by mouth daily. 90 tablet 3   BIOTIN 5000 PO Take 5,000 mg by mouth daily.     carvedilol  (COREG ) 12.5 MG tablet Take 1 tablet (12.5 mg total) by mouth 2 (two) times daily with a meal. 180 tablet 1   cetirizine (ZYRTEC) 10 MG tablet Take 10 mg by mouth daily.     clotrimazole-betamethasone (LOTRISONE) cream Apply 1 application topically as needed (groin).      Continuous Glucose Sensor (FREESTYLE LIBRE 3 SENSOR) MISC Use to check glucose continuously 6 each 3   dapagliflozin propanediol (FARXIGA) 5 MG TABS tablet Take 5 mg by mouth daily.     famotidine  (PEPCID ) 20 MG tablet Take 1 tablet by mouth once daily 90 tablet 1   gabapentin  (NEURONTIN ) 300 MG capsule TAKE 1 CAPSULE BY MOUTH THREE TIMES DAILY 270 capsule 2   hydrALAZINE  (APRESOLINE ) 50 MG tablet Take 1 tablet (50 mg total) by mouth 3 (three) times daily. 270 tablet 3   hydrocortisone  2.5 % cream Apply topically 3 (three) times daily as needed. 28 g 3   insulin  degludec (TRESIBA) 100 UNIT/ML FlexTouch Pen Inject 26 Units into the skin at bedtime.     isosorbide  mononitrate (IMDUR ) 60 MG 24 hr tablet Take 1 tablet (60 mg total) by mouth in the morning and at bedtime. 180 tablet 3   LORazepam  (ATIVAN ) 0.5 MG tablet Take 1 tablet by mouth every 8 (eight) hours as needed.     nystatin cream (MYCOSTATIN) Apply 1 application topically 2 (two) times daily as needed (irritation.).       pantoprazole  (PROTONIX ) 40 MG tablet Take 1 tablet by mouth once daily 90 tablet 1   Respiratory Therapy Supplies (NEBULIZER/TUBING/MOUTHPIECE) KIT 1 each by Does not apply route 3 (three) times daily as needed. 1 kit 0   Rivaroxaban (XARELTO) 15 MG TABS tablet Take 15 mg by mouth daily with supper.     Semaglutide, 2 MG/DOSE, (OZEMPIC, 2 MG/DOSE,) 8 MG/3ML SOPN Inject 2 mg into the skin once a week.     traMADol  (ULTRAM ) 50 MG tablet Take 1-2 tablets (50-100 mg total) by mouth every 6 (six) hours as needed for severe pain (pain score 7-10). 60 tablet 0   triamcinolone  cream (KENALOG ) 0.1 % Apply 1 Application topically 2 (two) times daily as needed. 45 g 1   vitamin B-12 (CYANOCOBALAMIN ) 1000 MCG tablet Take 500 mcg by mouth daily.     VITAMIN D  PO Take by mouth.     No current facility-administered medications on file prior to visit.    Allergies  Allergen Reactions   Ace Inhibitors Swelling    REACTION: tongue swelling   Lisinopril Swelling    Swelling of tongue   Insulin  Detemir Itching   Nsaids Other (See Comments)    Renal issues   Codeine Nausea Only  Hydrochlorothiazide Other (See Comments)    Dizziness/funny feeling   Tylenol  [Acetaminophen ] Other (See Comments)    Fatty liver - use with caution    Past Medical History:  Diagnosis Date   Arthritis    hands (01/13/2014)   Basal cell carcinoma 01/2014   bridge of nose   Cardiac LV ejection fraction >40%    it was 43 last year (01/13/2014)   Chronic lower back pain    CKD (chronic kidney disease), stage III (HCC)    acute on chronic stage III/notes 06/10/2018   Colon polyps    Expressive aphasia    3 times in the last week (01/13/2014)   GERD (gastroesophageal reflux disease)    Goiter past remote   treated with RI   Hyperlipidemia    Hyperpotassemia    Hypertension    Hypertonicity of bladder    Inflammatory and toxic neuropathy, unspecified    LBBB (left bundle branch block)    Migraine    stopped many  years ago (01/13/2014)   Mini stroke 01/2014   Other primary cardiomyopathies    Overactive bladder    Presence of combination internal cardiac defibrillator (ICD) and pacemaker    Reflex sympathetic dystrophy, unspecified    Shingles    Shortness of breath    ambulation   Stroke (HCC)    Thyroid  disease    Type II diabetes mellitus (HCC)    Ulcer    Urine incontinence    Vertigo    hx of    Past Surgical History:  Procedure Laterality Date   BACK SURGERY     BI-VENTRICULAR PACEMAKER INSERTION (CRT-P)  10/2014   [facility]   BREAST CYST EXCISION Left 1959   CARDIAC CATHETERIZATION  several   [place]   CATARACT EXTRACTION W/ INTRAOCULAR LENS IMPLANT Left ~ 2005   several eye injections   CATARACT EXTRACTION W/PHACO Right 05/04/2020   Procedure: CATARACT EXTRACTION PHACO AND INTRAOCULAR LENS PLACEMENT (IOC) RIGHT DIABETIC;  Surgeon: Jaye Fallow, MD;  Location: [facility] ORS;  Service: Ophthalmology;  Laterality: Right;  US  00:58.5 CDE 6.70 Fluid Pack [place] # [id number]   CHOLECYSTECTOMY N/A 06/13/2018   Procedure: LAPAROSCOPIC CHOLECYSTECTOMY;  Surgeon: Belinda Cough, MD;  Location: [facility] OR;  Service: General;  Laterality: N/A;   COLONOSCOPY  7/12   normal (hx of polyps in past)    CT SCAN  3/12   outside hosp- lung nodule and gallstones   ERCP N/A 06/12/2018   Procedure: ENDOSCOPIC RETROGRADE CHOLANGIOPANCREATOGRAPHY (ERCP);  Surgeon: Rollin Dover, MD;  Location: [facility] ENDOSCOPY;  Service: Endoscopy;  Laterality: N/A;   FINGER SURGERY Left 1959 & 1976   knot on index   KIDNEY DONATION Left 1989   LUMBAR LAMINECTOMY/DECOMPRESSION MICRODISCECTOMY  1999   LUMBAR LAMINECTOMY/DECOMPRESSION MICRODISCECTOMY  01/31/2012   Procedure: LUMBAR LAMINECTOMY/DECOMPRESSION MICRODISCECTOMY 2 LEVELS;  Surgeon: Alm GORMAN Molt, MD;  Location: [facility] NEURO ORS;  Service: Neurosurgery;  Laterality: N/A;  Thoracic twelve-lumbar one, lumbar one-two laminectomy    POSTERIOR LAMINECTOMY / DECOMPRESSION CERVICAL  SPINE  1999   REMOVAL OF STONES  06/12/2018   Procedure: REMOVAL OF STONES;  Surgeon: Rollin Dover, MD;  Location: [facility] ENDOSCOPY;  Service: Endoscopy;;   SPHINCTEROTOMY  06/12/2018   Procedure: [place];  Surgeon: Rollin Dover, MD;  Location: [facility] ENDOSCOPY;  Service: Endoscopy;;   TUBAL LIGATION  1976   UPPER GASTROINTESTINAL ENDOSCOPY      Family History  Problem Relation Age of Onset   Arthritis Mother    Cancer Mother  uterine and mouth   Hyperlipidemia Mother    Stroke Mother    Hypertension Mother    Alcohol abuse Father    Diabetes Father    Cancer Sister        breast   Diabetes Sister    Hyperlipidemia Sister    Heart disease Sister    Hypertension Sister    Diabetes Sister    Cancer Brother        lung cancer   Kidney disease Brother    Diabetes Brother    Hyperlipidemia Brother    Kidney failure Brother    Diabetes Daughter    Heart attack Daughter    Addison's disease Other     Social History   Socioeconomic History   Marital status: Married    Spouse name: Not on file   Number of children: 2   Years of education: Not on file   Highest education level: Not on file  Occupational History   Occupation: retired - Freight forwarder   Tobacco Use   Smoking status: Never   Smokeless tobacco: Never  Vaping Use   Vaping status: Never Used  Substance and Sexual Activity   Alcohol use: No    Alcohol/week: 0.0 standard drinks of alcohol   Drug use: No   Sexual activity: Yes  Other Topics Concern   Not on file  Social History Narrative   Married 2nd time   Daughter is Therapist, occupational    Social Drivers of Health   Financial Resource Strain: Low Risk  (05/31/2022)   Overall Financial Resource Strain (CARDIA)    Difficulty of Paying Living Expenses: Not hard at all  Food Insecurity: No Food Insecurity (05/31/2022)   Hunger Vital Sign    Worried About Running Out of Food in the Last Year: Never true    Ran Out of Food in the Last Year: Never true   Transportation Needs: No Transportation Needs (05/31/2022)   PRAPARE - Administrator, Civil Service (Medical): No    Lack of Transportation (Non-Medical): No  Physical Activity: Inactive (05/30/2021)   Exercise Vital Sign    Days of Exercise per Week: 0 days    Minutes of Exercise per Session: 0 min  Stress: No Stress Concern Present (05/31/2022)   [employer] of Occupational Health - Occupational Stress Questionnaire    Feeling of Stress : Not at all  Social Connections: Moderately Isolated (05/31/2022)   Social Connection and Isolation Panel    Frequency of Communication with Friends and Family: More than three times a week    Frequency of Social Gatherings with Friends and Family: Three times a week    Attends Religious Services: Never    Active Member of Clubs or Organizations: No    Attends Banker Meetings: Never    Marital Status: Married  Catering manager Violence: Not At Risk (05/31/2022)   Humiliation, Afraid, Rape, and Kick questionnaire    Fear of Current or Ex-Partner: No    Emotionally Abused: No    Physically Abused: No    Sexually Abused: No   Review of Systems No fever    Objective:   Physical Exam Skin:    Comments: Mostly vertical laceration on lateral left calf 3 cm or so Black eschar Redness, warmth and tenderness---only about 2 cm            Assessment & Plan:

## 2023-12-31 ENCOUNTER — Other Ambulatory Visit: Payer: Self-pay | Admitting: Family Medicine

## 2023-12-31 ENCOUNTER — Ambulatory Visit: Payer: Self-pay

## 2023-12-31 ENCOUNTER — Other Ambulatory Visit: Payer: Self-pay

## 2023-12-31 MED ORDER — DOXYCYCLINE HYCLATE 100 MG PO TABS: 100.0000 mg | ORAL_TABLET | Freq: Two times a day (BID) | ORAL | 0 refills | Status: DC

## 2023-12-31 NOTE — Telephone Encounter (Signed)
 FYI

## 2023-12-31 NOTE — Addendum Note (Signed)
 Addended by: KALLIE CLOTILDA SQUIBB on: 12/31/2023 04:03 PM   Modules accepted: Orders

## 2023-12-31 NOTE — Telephone Encounter (Signed)
 Spoke to pt. She wants to keep the appt tomorrow but wants to start the Doxy tonight. Rx sent to [facility].

## 2023-12-31 NOTE — Addendum Note (Signed)
 Addended by: KALLIE CLOTILDA SQUIBB on: 12/31/2023 04:00 PM   Modules accepted: Orders

## 2023-12-31 NOTE — Telephone Encounter (Signed)
 FYI Only or Action Required?: FYI only for provider.  Patient was last seen in primary care on 12/29/2023 by Jimmy Charlie FERNS, MD.  Called Nurse Triage reporting Wound Infection.  Symptoms began several days ago.  Interventions attempted: Cephalexin  500 tid x 5 days .  Symptoms are: unchanged.  Triage Disposition: See Physician Within 24 Hours  Patient/caregiver understands and will follow disposition?: Yes  **Patient would like a follow-up appointment so provider may see wound, as she reports it is not looking any better, appt. Scheduled for 7/31**       Copied from CRM #[id number]. Topic: Clinical - Red Word Triage >> Dec 31, 2023  1:58 PM Harlene ORN wrote: Red Word that prompted transfer to Nurse Triage: patient is a diabetic and has an infection in her left leg been a week and it's swollen and not healing Reason for Disposition  [1] Red area or streak AND [2] no fever  Answer Assessment - Initial Assessment Questions 1. LOCATION: Where is the wound located?       Left lower leg, calf area, opened car door and caused wound  2. WOUND APPEARANCE: What does the wound look like?      Reddened, tender, seems to be scabbing  3. SIZE: If redness is present, ask: What is the size of the red area? (Inches, centimeters, or compare to size of a coin)      1 inch   4. SPREAD: What's changed in the last day?  Do you see any red streaks coming from the wound?     Nothing, not healing  5. ONSET: When did it start to look infected?  X 1 weeks ago      6. MECHANISM: How did the wound start, what was the cause?     Car door caused wound  7. PAIN: Is there any pain? If Yes, ask: How bad is the pain?   (Scale 1-10; or mild, moderate, severe)     5/10  8. FEVER: Do you have a fever? If Yes, ask: What is your temperature, how was it measured, and when did it start?     No   9. OTHER SYMPTOMS: Do you have any other symptoms? (e.g., shaking chills, weakness,  rash elsewhere on body)  No   For home care she is applying Neosporin. Leaves open to air durning the day. She reports wound is not healing/looking any better. She is still taking the Cephalexin  500 tid x 5 days.  Protocols used: Wound Infection-A-AH

## 2023-12-31 NOTE — Telephone Encounter (Signed)
 Aware, thanks for seeing her

## 2024-01-01 ENCOUNTER — Ambulatory Visit (INDEPENDENT_AMBULATORY_CARE_PROVIDER_SITE_OTHER): Admitting: Internal Medicine

## 2024-01-01 ENCOUNTER — Encounter: Payer: Self-pay | Admitting: Internal Medicine

## 2024-01-01 VITALS — BP 122/74 | HR 66 | Temp 98.4°F | Ht 60.0 in | Wt 143.0 lb

## 2024-01-01 DIAGNOSIS — L03116 Cellulitis of left lower limb: Secondary | ICD-10-CM | POA: Insufficient documentation

## 2024-01-01 MED ORDER — LEVOFLOXACIN 250 MG PO TABS: 250.0000 mg | ORAL_TABLET | Freq: Every day | ORAL | 0 refills | Status: DC

## 2024-01-01 NOTE — Assessment & Plan Note (Signed)
 Poor response to cephalexin  No apparent abscess but recommended warm compresses just in case Will continue the doxycycline  Add levaquin  250mg  daily x 7 days (has tolerated cipro  in the past). Discussed tendon pain

## 2024-01-01 NOTE — Telephone Encounter (Signed)
 Name of Medication: Tramadol  50mg  Name of Pharmacy: [facility] [address], [place] - [address]  Last Fill or Written Date and Quantity: 06/23/23 60tab 0refills Last Office Visit and Type: 12/29/23 for laceration of lower extremity Next Office Visit and Type: 04/27/24 for annual  Last Controlled Substance Agreement Date: none Last UDS: none

## 2024-01-01 NOTE — Progress Notes (Addendum)
 Subjective:    Patient ID: Morgan Hubbard, female    DOB: 05-08-1935, 89 y.o.   MRN: [id number]  HPI Here due to ongoing inflammation at left leg injury site--with husband Did start the cephalexin  but felt it was not improving Now started doxycycline  and has taken 2 doses  Still throbs and is swollen No discharge No clear difference since starting the doxy  Current Outpatient Medications on File Prior to Visit  Medication Sig Dispense Refill   amLODipine  (NORVASC ) 2.5 MG tablet Take 2.5 mg by mouth at bedtime.     atorvastatin  (LIPITOR) 80 MG tablet Take 1 tablet (80 mg total) by mouth daily. 90 tablet 3   BIOTIN 5000 PO Take 5,000 mg by mouth daily.     carvedilol  (COREG ) 12.5 MG tablet Take 1 tablet (12.5 mg total) by mouth 2 (two) times daily with a meal. 180 tablet 1   cetirizine (ZYRTEC) 10 MG tablet Take 10 mg by mouth daily.     clotrimazole-betamethasone (LOTRISONE) cream Apply 1 application topically as needed (groin).      Continuous Glucose Sensor (FREESTYLE LIBRE 3 SENSOR) MISC Use to check glucose continuously 6 each 3   dapagliflozin propanediol (FARXIGA) 5 MG TABS tablet Take 5 mg by mouth daily.     doxycycline  (VIBRA -TABS) 100 MG tablet Take 1 tablet (100 mg total) by mouth 2 (two) times daily. 14 tablet 0   famotidine  (PEPCID ) 20 MG tablet Take 1 tablet by mouth once daily 90 tablet 0   gabapentin  (NEURONTIN ) 300 MG capsule TAKE 1 CAPSULE BY MOUTH THREE TIMES DAILY 270 capsule 2   hydrALAZINE  (APRESOLINE ) 50 MG tablet Take 1 tablet (50 mg total) by mouth 3 (three) times daily. 270 tablet 3   hydrocortisone  2.5 % cream Apply topically 3 (three) times daily as needed. 28 g 3   insulin  degludec (TRESIBA) 100 UNIT/ML FlexTouch Pen Inject 26 Units into the skin at bedtime.     isosorbide  mononitrate (IMDUR ) 60 MG 24 hr tablet Take 1 tablet (60 mg total) by mouth in the morning and at bedtime. 180 tablet 3   LORazepam  (ATIVAN ) 0.5 MG tablet Take 1 tablet by mouth every 8  (eight) hours as needed.     nystatin cream (MYCOSTATIN) Apply 1 application topically 2 (two) times daily as needed (irritation.).      pantoprazole  (PROTONIX ) 40 MG tablet Take 1 tablet by mouth once daily 90 tablet 1   Respiratory Therapy Supplies (NEBULIZER/TUBING/MOUTHPIECE) KIT 1 each by Does not apply route 3 (three) times daily as needed. 1 kit 0   Rivaroxaban (XARELTO) 15 MG TABS tablet Take 15 mg by mouth daily with supper.     Semaglutide, 2 MG/DOSE, (OZEMPIC, 2 MG/DOSE,) 8 MG/3ML SOPN Inject 2 mg into the skin once a week.     traMADol  (ULTRAM ) 50 MG tablet Take 1-2 tablets (50-100 mg total) by mouth every 6 (six) hours as needed for severe pain (pain score 7-10). 60 tablet 0   triamcinolone  cream (KENALOG ) 0.1 % Apply 1 Application topically 2 (two) times daily as needed. 45 g 1   vitamin B-12 (CYANOCOBALAMIN ) 1000 MCG tablet Take 500 mcg by mouth daily.     VITAMIN D  PO Take by mouth.     No current facility-administered medications on file prior to visit.    Allergies  Allergen Reactions   Ace Inhibitors Swelling    REACTION: tongue swelling   Lisinopril Swelling    Swelling of tongue   Insulin   Detemir Itching   Nsaids Other (See Comments)    Renal issues   Codeine Nausea Only   Hydrochlorothiazide Other (See Comments)    Dizziness/funny feeling   Tylenol  [Acetaminophen ] Other (See Comments)    Fatty liver - use with caution    Past Medical History:  Diagnosis Date   Arthritis    hands (01/13/2014)   Basal cell carcinoma 01/2014   bridge of nose   Cardiac LV ejection fraction >40%    it was 43 last year (01/13/2014)   Chronic lower back pain    CKD (chronic kidney disease), stage III (HCC)    acute on chronic stage III/notes 06/10/2018   Colon polyps    Expressive aphasia    3 times in the last week (01/13/2014)   GERD (gastroesophageal reflux disease)    Goiter past remote   treated with RI   Hyperlipidemia    Hyperpotassemia    Hypertension     Hypertonicity of bladder    Inflammatory and toxic neuropathy, unspecified    LBBB (left bundle branch block)    Migraine    stopped many years ago (01/13/2014)   Mini stroke 01/2014   Other primary cardiomyopathies    Overactive bladder    Presence of combination internal cardiac defibrillator (ICD) and pacemaker    Reflex sympathetic dystrophy, unspecified    Shingles    Shortness of breath    ambulation   Stroke (HCC)    Thyroid  disease    Type II diabetes mellitus (HCC)    Ulcer    Urine incontinence    Vertigo    hx of    Past Surgical History:  Procedure Laterality Date   BACK SURGERY     BI-VENTRICULAR PACEMAKER INSERTION (CRT-P)  10/2014   [facility]   BREAST CYST EXCISION Left 1959   CARDIAC CATHETERIZATION  several   [place]   CATARACT EXTRACTION W/ INTRAOCULAR LENS IMPLANT Left ~ 2005   several eye injections   CATARACT EXTRACTION W/PHACO Right 05/04/2020   Procedure: CATARACT EXTRACTION PHACO AND INTRAOCULAR LENS PLACEMENT (IOC) RIGHT DIABETIC;  Surgeon: Jaye Fallow, MD;  Location: [facility] ORS;  Service: Ophthalmology;  Laterality: Right;  US  00:58.5 CDE 6.70 Fluid Pack [place] # [id number]   CHOLECYSTECTOMY N/A 06/13/2018   Procedure: LAPAROSCOPIC CHOLECYSTECTOMY;  Surgeon: Belinda Cough, MD;  Location: [facility] OR;  Service: General;  Laterality: N/A;   COLONOSCOPY  7/12   normal (hx of polyps in past)    CT SCAN  3/12   outside hosp- lung nodule and gallstones   ERCP N/A 06/12/2018   Procedure: ENDOSCOPIC RETROGRADE CHOLANGIOPANCREATOGRAPHY (ERCP);  Surgeon: Rollin Dover, MD;  Location: [facility] ENDOSCOPY;  Service: Endoscopy;  Laterality: N/A;   FINGER SURGERY Left 1959 & 1976   knot on index   KIDNEY DONATION Left 1989   LUMBAR LAMINECTOMY/DECOMPRESSION MICRODISCECTOMY  1999   LUMBAR LAMINECTOMY/DECOMPRESSION MICRODISCECTOMY  01/31/2012   Procedure: LUMBAR LAMINECTOMY/DECOMPRESSION MICRODISCECTOMY 2 LEVELS;  Surgeon: Alm GORMAN Molt, MD;  Location: [facility] NEURO ORS;   Service: Neurosurgery;  Laterality: N/A;  Thoracic twelve-lumbar one, lumbar one-two laminectomy    POSTERIOR LAMINECTOMY / DECOMPRESSION CERVICAL SPINE  1999   REMOVAL OF STONES  06/12/2018   Procedure: REMOVAL OF STONES;  Surgeon: Rollin Dover, MD;  Location: [facility] ENDOSCOPY;  Service: Endoscopy;;   SPHINCTEROTOMY  06/12/2018   Procedure: ANNETT;  Surgeon: Rollin Dover, MD;  Location[facility] ENDOSCOPY;  Service: Endoscopy;;   TUBAL LIGATION  1976   UPPER GASTROINTESTINAL ENDOSCOPY  Family History  Problem Relation Age of Onset   Arthritis Mother    Cancer Mother        uterine and mouth   Hyperlipidemia Mother    Stroke Mother    Hypertension Mother    Alcohol abuse Father    Diabetes Father    Cancer Sister        breast   Diabetes Sister    Hyperlipidemia Sister    Heart disease Sister    Hypertension Sister    Diabetes Sister    Cancer Brother        lung cancer   Kidney disease Brother    Diabetes Brother    Hyperlipidemia Brother    Kidney failure Brother    Diabetes Daughter    Heart attack Daughter    Addison's disease Other     Social History   Socioeconomic History   Marital status: Married    Spouse name: Not on file   Number of children: 2   Years of education: Not on file   Highest education level: Not on file  Occupational History   Occupation: retired - Freight forwarder   Tobacco Use   Smoking status: Never   Smokeless tobacco: Never  Vaping Use   Vaping status: Never Used  Substance and Sexual Activity   Alcohol use: No    Alcohol/week: 0.0 standard drinks of alcohol   Drug use: No   Sexual activity: Yes  Other Topics Concern   Not on file  Social History Narrative   Married 2nd time   Daughter is Therapist, occupational    Social Drivers of Health   Financial Resource Strain: Low Risk  (05/31/2022)   Overall Financial Resource Strain (CARDIA)    Difficulty of Paying Living Expenses: Not hard at all  Food Insecurity: No Food Insecurity  (05/31/2022)   Hunger Vital Sign    Worried About Running Out of Food in the Last Year: Never true    Ran Out of Food in the Last Year: Never true  Transportation Needs: No Transportation Needs (05/31/2022)   PRAPARE - Administrator, Civil Service (Medical): No    Lack of Transportation (Non-Medical): No  Physical Activity: Inactive (05/30/2021)   Exercise Vital Sign    Days of Exercise per Week: 0 days    Minutes of Exercise per Session: 0 min  Stress: No Stress Concern Present (05/31/2022)   [employer] of Occupational Health - Occupational Stress Questionnaire    Feeling of Stress : Not at all  Social Connections: Moderately Isolated (05/31/2022)   Social Connection and Isolation Panel    Frequency of Communication with Friends and Family: More than three times a week    Frequency of Social Gatherings with Friends and Family: Three times a week    Attends Religious Services: Never    Active Member of Clubs or Organizations: No    Attends Banker Meetings: Never    Marital Status: Married  Catering manager Violence: Not At Risk (05/31/2022)   Humiliation, Afraid, Rape, and Kick questionnaire    Fear of Current or Ex-Partner: No    Emotionally Abused: No    Physically Abused: No    Sexually Abused: No   Review of Systems No fever No N/V    Objective:   Physical Exam Skin:    Comments: Vertical laceration is about the same --indented and black eschar Slight increase in redness around wound--and still tender No palpable abscess Wound overlies only  soft tissue--no bone nearby            Assessment & Plan:

## 2024-01-01 NOTE — Patient Instructions (Signed)
 Continue the doxycyline but also add the new antibiotic (levofloxacin  250mg  daily) Try warm compresses If not significantly improved by Monday--we will need to get consult by a wound specialist

## 2024-01-12 ENCOUNTER — Telehealth: Payer: Self-pay | Admitting: *Deleted

## 2024-01-12 DIAGNOSIS — L03116 Cellulitis of left lower limb: Secondary | ICD-10-CM

## 2024-01-12 DIAGNOSIS — S81812S Laceration without foreign body, left lower leg, sequela: Secondary | ICD-10-CM

## 2024-01-12 NOTE — Telephone Encounter (Signed)
 Copied from CRM [id number]. Topic: Referral - Request for Referral >> Jan 12, 2024  9:54 AM Mercedes MATSU wrote: Did the patient discuss referral with their provider in the last year? Yes (If No - schedule appointment) (If Yes - send message)  Appointment offered? Yes  Type of order/referral and detailed reason for visit: Patient called in stating that she would like a referral, she said she has a bad wound her and she would like a wound specialist referral. She states that her leg is still swollen and sore,  and she has seen pcp twice and its still not healing. Patient can be reached at [phone] once referral has been sent.  Preference of office, provider, location: whomever the doctor requests  If referral order, have you been seen by this specialty before? No (If Yes, this issue or another issue? When? Where?  Can we respond through MyChart? No

## 2024-01-12 NOTE — Telephone Encounter (Signed)
 Patient called back to check on order. Patient asks to please forward message to Dr. Jimmy.

## 2024-01-12 NOTE — Telephone Encounter (Signed)
 Spoke to pt. Advised her to make sure when she goes to the referral that her notes go to Dr Randeen because Dr Jimmy is retiring at the end of the month.

## 2024-01-14 ENCOUNTER — Ambulatory Visit: Payer: Self-pay

## 2024-01-14 NOTE — Telephone Encounter (Signed)
 Appt scheduled with Dr. Jimmy tomorrow. FIY to PCP and Dr. Letvak

## 2024-01-14 NOTE — Telephone Encounter (Signed)
 Aware  Thanks for seeing her

## 2024-01-14 NOTE — Telephone Encounter (Signed)
 FYI Only or Action Required?: FYI only for provider.  Patient was last seen in primary care on 01/01/2024 by Jimmy Charlie FERNS, MD.  Called Nurse Triage reporting Leg Swelling.  Symptoms began several weeks ago.  Interventions attempted: Nothing.  Symptoms are: gradually worsening.  Triage Disposition: See Physician Within 24 Hours  Patient/caregiver understands and will follow disposition?: Yes    Copied from CRM #[id number]. Topic: Clinical - Red Word Triage >> Jan 14, 2024  1:22 PM Morgan Hubbard wrote: Red Word that prompted transfer to Nurse Triage: Patient stated she has a Very sore & swollen leg. Would like to know if she would need more medication can't see wound doctor until next. Would like to know next step. Reason for Disposition  [1] MODERATE leg swelling (e.g., swelling extends up to knees) AND [2] new-onset or getting worse  Answer Assessment - Initial Assessment Questions 1. ONSET: When did the swelling start? (e.g., minutes, hours, days)     Left leg 2. LOCATION: What part of the leg is swollen?  Are both legs swollen or just one leg?     Left leg, three weeks ago had an injury 3. SEVERITY: How bad is the swelling? (e.g., localized; mild, moderate, severe)     Moderate; has referral to wound clinic and her first visit is next Thursday 4. REDNESS: Is there redness or signs of infection?     yes 5. PAIN: Is the swelling painful to touch? If Yes, ask: How painful is it?   (Scale 1-10; mild, moderate or severe)     mild 6. FEVER: Do you have a fever? If Yes, ask: What is it, how was it measured, and when did it start?      denies 7. CAUSE: What do you think is causing the leg swelling?     wound 8. MEDICAL HISTORY: Do you have a history of blood clots (e.g., DVT), cancer, heart failure, kidney disease, or liver failure?     DM, pacer and defib, one kidney  9. RECURRENT SYMPTOM: Have you had leg swelling before? If Yes, ask: When was the last  time? What happened that time?     Yes, three weeks ago 10. OTHER SYMPTOMS: Do you have any other symptoms? (e.g., chest pain, difficulty breathing)       no 11. PREGNANCY: Is there any chance you are pregnant? When was your last menstrual period?       no  Protocols used: Leg Swelling and Edema-A-AH

## 2024-01-15 ENCOUNTER — Telehealth: Payer: Self-pay | Admitting: *Deleted

## 2024-01-15 ENCOUNTER — Ambulatory Visit: Admitting: Internal Medicine

## 2024-01-15 ENCOUNTER — Encounter: Payer: Self-pay | Admitting: Internal Medicine

## 2024-01-15 VITALS — BP 112/70 | HR 67 | Temp 98.7°F | Ht 60.0 in | Wt 143.0 lb

## 2024-01-15 DIAGNOSIS — L710 Perioral dermatitis: Secondary | ICD-10-CM

## 2024-01-15 DIAGNOSIS — S81812D Laceration without foreign body, left lower leg, subsequent encounter: Secondary | ICD-10-CM | POA: Diagnosis not present

## 2024-01-15 DIAGNOSIS — L03116 Cellulitis of left lower limb: Secondary | ICD-10-CM

## 2024-01-15 NOTE — Telephone Encounter (Signed)
 Copied from CRM #[id number]. Topic: General - Billing Inquiry >> Jan 15, 2024 10:53 AM Pinkey ORN wrote: Reason for CRM: Billing Inquiry >> Jan 15, 2024 10:55 AM Pinkey ORN wrote: Patient is calling in regards to a billing error. States her previous appointment was coded as medical instead of pharmacy and is requesting a call back. Please contact patient at [phone]

## 2024-01-15 NOTE — Assessment & Plan Note (Signed)
 Discussed avoiding steroid or irritants Try multivitamin If worsens, can send Rx for metronidazole  cream

## 2024-01-15 NOTE — Telephone Encounter (Signed)
 Will route to Amy to f/u with pt

## 2024-01-15 NOTE — Progress Notes (Signed)
 Subjective:    Patient ID: Morgan Hubbard, female    DOB: 01-22-1935, 89 y.o.   MRN: [id number]  HPI Here due to ongoing problems around leg wound  Did get appt with wound clinic for next week She is still concerned due to ongoing swelling and soreness She thinks the levaquin  did help some No fever  No discharge from wound  Using topical neosporin--not changing daily due to pulling off eschar  Also has rash at corner of right mouth  Current Outpatient Medications on File Prior to Visit  Medication Sig Dispense Refill   amLODipine  (NORVASC ) 2.5 MG tablet Take 2.5 mg by mouth at bedtime.     atorvastatin  (LIPITOR) 80 MG tablet Take 1 tablet (80 mg total) by mouth daily. 90 tablet 3   BIOTIN 5000 PO Take 5,000 mg by mouth daily.     carvedilol  (COREG ) 12.5 MG tablet Take 1 tablet (12.5 mg total) by mouth 2 (two) times daily with a meal. 180 tablet 1   cetirizine (ZYRTEC) 10 MG tablet Take 10 mg by mouth daily.     clotrimazole-betamethasone (LOTRISONE) cream Apply 1 application topically as needed (groin).      Continuous Glucose Sensor (FREESTYLE LIBRE 3 SENSOR) MISC Use to check glucose continuously 6 each 3   dapagliflozin propanediol (FARXIGA) 5 MG TABS tablet Take 5 mg by mouth daily.     famotidine  (PEPCID ) 20 MG tablet Take 1 tablet by mouth once daily 90 tablet 0   gabapentin  (NEURONTIN ) 300 MG capsule TAKE 1 CAPSULE BY MOUTH THREE TIMES DAILY 270 capsule 2   hydrALAZINE  (APRESOLINE ) 50 MG tablet Take 1 tablet (50 mg total) by mouth 3 (three) times daily. 270 tablet 3   hydrocortisone  2.5 % cream Apply topically 3 (three) times daily as needed. 28 g 3   insulin  degludec (TRESIBA) 100 UNIT/ML FlexTouch Pen Inject 26 Units into the skin at bedtime.     isosorbide  mononitrate (IMDUR ) 60 MG 24 hr tablet Take 1 tablet (60 mg total) by mouth in the morning and at bedtime. 180 tablet 3   LORazepam  (ATIVAN ) 0.5 MG tablet Take 1 tablet by mouth every 8 (eight) hours as needed.      nystatin cream (MYCOSTATIN) Apply 1 application topically 2 (two) times daily as needed (irritation.).      pantoprazole  (PROTONIX ) 40 MG tablet Take 1 tablet by mouth once daily 90 tablet 1   Respiratory Therapy Supplies (NEBULIZER/TUBING/MOUTHPIECE) KIT 1 each by Does not apply route 3 (three) times daily as needed. 1 kit 0   Rivaroxaban (XARELTO) 15 MG TABS tablet Take 15 mg by mouth daily with supper.     Semaglutide, 2 MG/DOSE, (OZEMPIC, 2 MG/DOSE,) 8 MG/3ML SOPN Inject 2 mg into the skin once a week.     traMADol  (ULTRAM ) 50 MG tablet Take 1-2 tablets (50-100 mg total) by mouth every 6 (six) hours as needed for severe pain (pain score 7-10). 60 tablet 0   triamcinolone  cream (KENALOG ) 0.1 % Apply 1 Application topically 2 (two) times daily as needed. 45 g 1   vitamin B-12 (CYANOCOBALAMIN ) 1000 MCG tablet Take 500 mcg by mouth daily.     VITAMIN D  PO Take by mouth.     No current facility-administered medications on file prior to visit.    Allergies  Allergen Reactions   Ace Inhibitors Swelling    REACTION: tongue swelling   Lisinopril Swelling    Swelling of tongue   Insulin  Detemir Itching  Nsaids Other (See Comments)    Renal issues   Codeine Nausea Only   Hydrochlorothiazide Other (See Comments)    Dizziness/funny feeling   Tylenol  [Acetaminophen ] Other (See Comments)    Fatty liver - use with caution    Past Medical History:  Diagnosis Date   Arthritis    hands (01/13/2014)   Basal cell carcinoma 01/2014   bridge of nose   Cardiac LV ejection fraction >40%    it was 43 last year (01/13/2014)   Chronic lower back pain    CKD (chronic kidney disease), stage III (HCC)    acute on chronic stage III/notes 06/10/2018   Colon polyps    Expressive aphasia    3 times in the last week (01/13/2014)   GERD (gastroesophageal reflux disease)    Goiter past remote   treated with RI   Hyperlipidemia    Hyperpotassemia    Hypertension    Hypertonicity of bladder     Inflammatory and toxic neuropathy, unspecified    LBBB (left bundle branch block)    Migraine    stopped many years ago (01/13/2014)   Mini stroke 01/2014   Other primary cardiomyopathies    Overactive bladder    Presence of combination internal cardiac defibrillator (ICD) and pacemaker    Reflex sympathetic dystrophy, unspecified    Shingles    Shortness of breath    ambulation   Stroke (HCC)    Thyroid  disease    Type II diabetes mellitus (HCC)    Ulcer    Urine incontinence    Vertigo    hx of    Past Surgical History:  Procedure Laterality Date   BACK SURGERY     BI-VENTRICULAR PACEMAKER INSERTION (CRT-P)  10/2014   [facility]   BREAST CYST EXCISION Left 1959   CARDIAC CATHETERIZATION  several   [place]   CATARACT EXTRACTION W/ INTRAOCULAR LENS IMPLANT Left ~ 2005   several eye injections   CATARACT EXTRACTION W/PHACO Right 05/04/2020   Procedure: CATARACT EXTRACTION PHACO AND INTRAOCULAR LENS PLACEMENT (IOC) RIGHT DIABETIC;  Surgeon: Jaye Fallow, MD;  Location: [facility] ORS;  Service: Ophthalmology;  Laterality: Right;  US  00:58.5 CDE 6.70 Fluid Pack [place] # [id number]   CHOLECYSTECTOMY N/A 06/13/2018   Procedure: LAPAROSCOPIC CHOLECYSTECTOMY;  Surgeon: Belinda Cough, MD;  Location: [facility] OR;  Service: General;  Laterality: N/A;   COLONOSCOPY  7/12   normal (hx of polyps in past)    CT SCAN  3/12   outside hosp- lung nodule and gallstones   ERCP N/A 06/12/2018   Procedure: ENDOSCOPIC RETROGRADE CHOLANGIOPANCREATOGRAPHY (ERCP);  Surgeon: Rollin Dover, MD;  Location: [facility] ENDOSCOPY;  Service: Endoscopy;  Laterality: N/A;   FINGER SURGERY Left 1959 & 1976   knot on index   KIDNEY DONATION Left 1989   LUMBAR LAMINECTOMY/DECOMPRESSION MICRODISCECTOMY  1999   LUMBAR LAMINECTOMY/DECOMPRESSION MICRODISCECTOMY  01/31/2012   Procedure: LUMBAR LAMINECTOMY/DECOMPRESSION MICRODISCECTOMY 2 LEVELS;  Surgeon: Alm GORMAN Molt, MD;  Location: [facility] NEURO ORS;  Service: Neurosurgery;  Laterality:  N/A;  Thoracic twelve-lumbar one, lumbar one-two laminectomy    POSTERIOR LAMINECTOMY / DECOMPRESSION CERVICAL SPINE  1999   REMOVAL OF STONES  06/12/2018   Procedure: REMOVAL OF STONES;  Surgeon: Rollin Dover, MD;  Location: [facility] ENDOSCOPY;  Service: Endoscopy;;   SPHINCTEROTOMY  06/12/2018   Procedure: ANNETT;  Surgeon: Rollin Dover, MD;  Location: [facility] ENDOSCOPY;  Service: Endoscopy;;   TUBAL LIGATION  1976   UPPER GASTROINTESTINAL ENDOSCOPY      Family History  Problem Relation Age of Onset   Arthritis Mother    Cancer Mother        uterine and mouth   Hyperlipidemia Mother    Stroke Mother    Hypertension Mother    Alcohol abuse Father    Diabetes Father    Cancer Sister        breast   Diabetes Sister    Hyperlipidemia Sister    Heart disease Sister    Hypertension Sister    Diabetes Sister    Cancer Brother        lung cancer   Kidney disease Brother    Diabetes Brother    Hyperlipidemia Brother    Kidney failure Brother    Diabetes Daughter    Heart attack Daughter    Addison's disease Other     Social History   Socioeconomic History   Marital status: Married    Spouse name: Not on file   Number of children: 2   Years of education: Not on file   Highest education level: Not on file  Occupational History   Occupation: retired - Freight forwarder   Tobacco Use   Smoking status: Never   Smokeless tobacco: Never  Vaping Use   Vaping status: Never Used  Substance and Sexual Activity   Alcohol use: No    Alcohol/week: 0.0 standard drinks of alcohol   Drug use: No   Sexual activity: Yes  Other Topics Concern   Not on file  Social History Narrative   Married 2nd time   Daughter is Therapist, occupational    Social Drivers of Health   Financial Resource Strain: Low Risk  (05/31/2022)   Overall Financial Resource Strain (CARDIA)    Difficulty of Paying Living Expenses: Not hard at all  Food Insecurity: No Food Insecurity (05/31/2022)   Hunger Vital Sign     Worried About Running Out of Food in the Last Year: Never true    Ran Out of Food in the Last Year: Never true  Transportation Needs: No Transportation Needs (05/31/2022)   PRAPARE - Administrator, Civil Service (Medical): No    Lack of Transportation (Non-Medical): No  Physical Activity: Inactive (05/30/2021)   Exercise Vital Sign    Days of Exercise per Week: 0 days    Minutes of Exercise per Session: 0 min  Stress: No Stress Concern Present (05/31/2022)   [employer] of Occupational Health - Occupational Stress Questionnaire    Feeling of Stress : Not at all  Social Connections: Moderately Isolated (05/31/2022)   Social Connection and Isolation Panel    Frequency of Communication with Friends and Family: More than three times a week    Frequency of Social Gatherings with Friends and Family: Three times a week    Attends Religious Services: Never    Active Member of Clubs or Organizations: No    Attends Banker Meetings: Never    Marital Status: Married  Catering manager Violence: Not At Risk (05/31/2022)   Humiliation, Afraid, Rape, and Kick questionnaire    Fear of Current or Ex-Partner: No    Emotionally Abused: No    Physically Abused: No    Sexually Abused: No   Review of Systems     Objective:   Physical Exam Constitutional:      Appearance: Normal appearance.  HENT:     Mouth/Throat:     Comments: Slight perioral rash at corner of right mouth Skin:    Comments: Laceration  is markedly improved Wound is opposed now---clean eschar that separated taking off bandaid Still mild indurated area on top of wound which is mildly tender---but no fluctuance or inflammation  Neurological:     Mental Status: She is alert.            Assessment & Plan:

## 2024-01-15 NOTE — Assessment & Plan Note (Signed)
 Wound is actually markedly improved Superficial now and has some induration/tenderness at the top---but only superficial wound now Covered with neosporin/telfa May be able to cancel wound appointment

## 2024-01-20 ENCOUNTER — Other Ambulatory Visit: Payer: Self-pay | Admitting: Family Medicine

## 2024-01-22 ENCOUNTER — Ambulatory Visit: Admitting: Physician Assistant

## 2024-01-23 DIAGNOSIS — Z9221 Personal history of antineoplastic chemotherapy: Secondary | ICD-10-CM | POA: Diagnosis not present

## 2024-01-23 DIAGNOSIS — Z853 Personal history of malignant neoplasm of breast: Secondary | ICD-10-CM | POA: Diagnosis not present

## 2024-01-23 DIAGNOSIS — R42 Dizziness and giddiness: Secondary | ICD-10-CM | POA: Diagnosis not present

## 2024-01-23 DIAGNOSIS — I959 Hypotension, unspecified: Secondary | ICD-10-CM | POA: Diagnosis not present

## 2024-01-23 DIAGNOSIS — C50919 Malignant neoplasm of unspecified site of unspecified female breast: Secondary | ICD-10-CM | POA: Diagnosis not present

## 2024-01-23 DIAGNOSIS — Z1231 Encounter for screening mammogram for malignant neoplasm of breast: Secondary | ICD-10-CM | POA: Diagnosis not present

## 2024-01-23 DIAGNOSIS — Z9889 Other specified postprocedural states: Secondary | ICD-10-CM | POA: Diagnosis not present

## 2024-01-23 DIAGNOSIS — R92333 Mammographic heterogeneous density, bilateral breasts: Secondary | ICD-10-CM | POA: Diagnosis not present

## 2024-01-29 NOTE — Telephone Encounter (Signed)
 Called patient to remind them meds here for pick up. She will try to come by this week.

## 2024-01-30 ENCOUNTER — Ambulatory Visit: Payer: Self-pay

## 2024-01-30 NOTE — Telephone Encounter (Signed)
 This per her oncology follow up at [facility] 8/22: Concerning her feeling unsteady and squirrelly headed, I rechecked her b/p and it was 86/48. The medical assistant recheck as well with 86/50. I recommended she be evaluated in the ED. She at first agreed and then her husband said they declined as they thought she just needed to eat. She was given a granola bar. Our rapid response team 115 was called and recommended ED evaluation. Patient declined.   I do want her to hold her amlodipine  as it sounds like she has low blood pressure Hydrate I understand she declined ER but I stand by that recommendation if she still feels bad this weekend

## 2024-01-30 NOTE — Telephone Encounter (Signed)
 Pt notified of Dr. Graham comments. ER precautions/ recommendations given. Pt will hold the amlodipine  and keep f/u with PCP on Tuesday

## 2024-01-30 NOTE — Telephone Encounter (Signed)
 FYI Only or Action Required?: FYI only for provider.  Patient was last seen in primary care on 01/15/2024 by Morgan Charlie FERNS, MD.  Called Nurse Triage reporting Dizziness.  Symptoms began 2 weeks ago.  Interventions attempted: Other: was seen at [facility] and advised to follow up with her PCP.  Symptoms are: unchanged.  Triage Disposition: See PCP When Office is Open (Within 3 Days)  Patient/caregiver understands and will follow disposition?: Yes         Copied from CRM [id number]. Topic: Clinical - Red Word Triage >> Jan 30, 2024 12:39 PM Jayma L wrote: Red Word that prompted transfer to Nurse Triage: patient called in and stated she is light head and has loss of balance, been this way for 2 weeks         Reason for Disposition  [1] MODERATE dizziness (e.g., interferes with normal activities) AND [2] has been evaluated by doctor (or NP/PA) for this  Answer Assessment - Initial Assessment Questions Patient was seen last week at [facility] and was advised to follow up with PCP about her dizziness. She reports her symptoms have not been worsening.       1. DESCRIPTION: Describe your dizziness.     I feel off balance 2. LIGHTHEADED: Do you feel lightheaded? (e.g., somewhat faint, woozy, weak upon standing)     Yes 3. VERTIGO: Do you feel like either you or the room is spinning or tilting? (i.e., vertigo)     No 4. SEVERITY: How bad is it?  Do you feel like you are going to faint? Can you stand and walk?     Moderate, feels very unstable  5. ONSET:  When did the dizziness begin?     2 weeks ago  6. AGGRAVATING FACTORS: Does anything make it worse? (e.g., standing, change in head position)     Standing exacerbates symptoms  8. CAUSE: What do you think is causing the dizziness? (e.g., decreased fluids or food, diarrhea, emotional distress, heat exposure, new medicine, sudden standing, vomiting; unknown)     Unsure if due to dehydration but has been trying to  drink more water  9. RECURRENT SYMPTOM: Have you had dizziness before? If Yes, ask: When was the last time? What happened that time?     Yes, has had dizziness in the past with dehydration  10. OTHER SYMPTOMS: Do you have any other symptoms? (e.g., fever, chest pain, vomiting, diarrhea, bleeding)       No  Protocols used: Dizziness - Lightheadedness-A-AH

## 2024-02-03 ENCOUNTER — Ambulatory Visit (INDEPENDENT_AMBULATORY_CARE_PROVIDER_SITE_OTHER): Admitting: Family Medicine

## 2024-02-03 ENCOUNTER — Encounter: Payer: Self-pay | Admitting: Family Medicine

## 2024-02-03 VITALS — BP 118/50 | HR 84 | Temp 98.4°F | Ht 60.0 in | Wt 145.4 lb

## 2024-02-03 DIAGNOSIS — N183 Chronic kidney disease, stage 3 unspecified: Secondary | ICD-10-CM | POA: Diagnosis not present

## 2024-02-03 DIAGNOSIS — R42 Dizziness and giddiness: Secondary | ICD-10-CM

## 2024-02-03 DIAGNOSIS — L03116 Cellulitis of left lower limb: Secondary | ICD-10-CM

## 2024-02-03 DIAGNOSIS — I952 Hypotension due to drugs: Secondary | ICD-10-CM

## 2024-02-03 NOTE — Assessment & Plan Note (Signed)
 Strongly encouraged better hydration again  Discussed importance of fluid intake for renal health  Last GFR was 34 Cr 1.5  Continues nephrology care

## 2024-02-03 NOTE — Assessment & Plan Note (Signed)
 Likely due to hypotension  Holding amlodpine Will reach out to cardiology to see if coreg , imdur  or hydralazine  can be adjusted   Also discussed minimizing gabapentin  and tramadol  -? If more sensitive to these with age   Strongly encouraged better hydration - she struggled with this and discussed ways to trouble shoot Also not eating much -which can lead to muscle loss/frailty / balance issues  Encouraged to try and keep up a better meal schedule

## 2024-02-03 NOTE — Patient Instructions (Addendum)
 Try not to take tramadol  unless absolutely necessary  Try to stick to less gabapentin  if you can (twice daily instead of three times daily)    Try to eat regularly even if you are not hungry  Do really work on fluid intake to prevent dehydration   Your blood pressure is still low  This is no doubt adding to your light headedness   I will reach out to cardiology (Dr Perla) regarding your blood pressure medicines    If symptoms worsen in meantime let us  know

## 2024-02-03 NOTE — Progress Notes (Signed)
 Subjective:    Patient ID: Morgan Hubbard, female    DOB: 14-Oct-1934, 89 y.o.   MRN: [id number]  HPI  Wt Readings from Last 3 Encounters:  02/03/24 145 lb 6 oz (65.9 kg)  01/15/24 143 lb (64.9 kg)  01/01/24 143 lb (64.9 kg)   28.39 kg/m  Vitals:   02/03/24 1454 02/03/24 1524  BP: (!) 118/58 (!) 118/50  Pulse: 84   Temp: 98.4 F (36.9 C)   SpO2: 95%     Pt presents with c/o Dizziness-2 weeks Skin spot left leg    Fuzzy feeling  Unbalanced  Light headed  Not a spinning sensation  No nausea   No headache as a rule  No head injury or falls or trauma   Not drinking enough fluid  Is busy  Is out a lot  Some traveling    We instructed her to hold her amlodipine   Leg issue- injury after gash on her left leg (opened car door)  Is a lot better than it was but still sensitive/sore  6 weeks   HTN Under cardiology care for this in setting of cardiomyopathy, past small vessel CVA bp is stable today  No cp or palpitations or headaches or edema  No side effects to medicines  BP Readings from Last 3 Encounters:  02/03/24 (!) 118/50  01/15/24 112/70  01/01/24 122/74    Amlodipine  2.5 mg daily -holding that now Coreg  12.5 g tid   Imdur  60 mg bid Hydralazine  50 mg bid (instead of tid)   Pulse Readings from Last 3 Encounters:  02/03/24 84  01/15/24 67  01/01/24 66      Lab Results  Component Value Date   NA 139 10/08/2023   K 5.2 No hemolysis seen (H) 10/08/2023   CO2 33 (H) 10/08/2023   GLUCOSE 137 (H) 10/08/2023   BUN 22 10/08/2023   CREATININE 1.73 (H) 10/08/2023   CALCIUM  9.2 10/08/2023   GFR 26.37 (L) 10/08/2023   EGFR 33.0 11/02/2022   GFRNONAA 34 (L) 07/04/2023  Sees nephrology for CKD  Gabapentin  - bid now (tid when needed) for neuroapathy Tramadol  -not every single day (for back pain)     Lab Results  Component Value Date   WBC 6.1 10/08/2023   HGB 13.9 10/08/2023   HCT 41.7 10/08/2023   MCV 89.5 10/08/2023   PLT 170.0 10/08/2023    Lab Results  Component Value Date   ALT 21 10/08/2023   AST 29 10/08/2023   ALKPHOS 79 10/08/2023   BILITOT 0.5 10/08/2023   Lab Results  Component Value Date   TSH 4.43 03/26/2023      Patient Active Problem List   Diagnosis Date Noted   Perioral dermatitis 01/15/2024   Laceration of left leg 12/29/2023   Pruritus 10/08/2023   Poor balance 07/09/2023   Injury of right toe, initial encounter 04/23/2023   Current use of proton pump inhibitor 03/24/2023   Rectal bleeding 02/17/2023   Senile purpura (HCC) 01/20/2023   Stage 3 chronic kidney disease, unspecified whether stage 3a or 3b CKD (HCC) 08/05/2022   Pain due to onychomycosis of toenails of both feet 04/04/2022   Pre-syncope 11/29/2021   Hypotension 11/29/2021   History of breast cancer 06/18/2020   Elevated TSH 06/16/2020   Occipital neuralgia 02/14/2020   Light headed 02/02/2019   Hair loss 11/09/2018   Leg weakness, bilateral 06/29/2018   Pedal edema 06/22/2018   AICD (automatic cardioverter/defibrillator) present 06/11/2018   GERD (gastroesophageal reflux  disease) 06/10/2018   Chronic back pain 05/23/2017   Chronic systolic (congestive) heart failure (HCC) 02/25/2017   Osteoporosis 06/16/2016   Routine general medical examination at a health care facility 04/24/2016   Estrogen deficiency 04/24/2016   Stroke, small vessel (HCC) 01/18/2014   Family history of hemochromatosis 01/18/2014   Nonischemic cardiomyopathy (HCC) 04/05/2013   Elevated transaminase level 05/18/2012   Spinal stenosis of lumbar region 12/10/2011   Mixed incontinence urge and stress 12/10/2011   Goiter 01/14/2011   Fatty liver 08/28/2010   Type II diabetes mellitus with renal manifestations (HCC) 07/09/2010   Hyperlipidemia associated with type 2 diabetes mellitus (HCC) 07/09/2010   Reflex sympathetic dystrophy 07/09/2010   Neuropathy 07/09/2010   Essential hypertension 07/09/2010   CARDIOMYOPATHY 07/09/2010   OVERACTIVE BLADDER  07/09/2010   Past Medical History:  Diagnosis Date   Arthritis    hands (01/13/2014)   Basal cell carcinoma 01/2014   bridge of nose   Cardiac LV ejection fraction >40%    it was 43 last year (01/13/2014)   Chronic lower back pain    CKD (chronic kidney disease), stage III (HCC)    acute on chronic stage III/notes 06/10/2018   Colon polyps    Expressive aphasia    3 times in the last week (01/13/2014)   GERD (gastroesophageal reflux disease)    Goiter past remote   treated with RI   Hyperlipidemia    Hyperpotassemia    Hypertension    Hypertonicity of bladder    Inflammatory and toxic neuropathy, unspecified    LBBB (left bundle branch block)    Migraine    stopped many years ago (01/13/2014)   Mini stroke 01/2014   Other primary cardiomyopathies    Overactive bladder    Presence of combination internal cardiac defibrillator (ICD) and pacemaker    Reflex sympathetic dystrophy, unspecified    Shingles    Shortness of breath    ambulation   Stroke (HCC)    Thyroid  disease    Type II diabetes mellitus (HCC)    Ulcer    Urine incontinence    Vertigo    hx of   Past Surgical History:  Procedure Laterality Date   BACK SURGERY     BI-VENTRICULAR PACEMAKER INSERTION (CRT-P)  10/2014   [facility]   BREAST CYST EXCISION Left 1959   CARDIAC CATHETERIZATION  several   [place]   CATARACT EXTRACTION W/ INTRAOCULAR LENS IMPLANT Left ~ 2005   several eye injections   CATARACT EXTRACTION W/PHACO Right 05/04/2020   Procedure: CATARACT EXTRACTION PHACO AND INTRAOCULAR LENS PLACEMENT (IOC) RIGHT DIABETIC;  Surgeon: Jaye Fallow, MD;  Location: [facility] ORS;  Service: Ophthalmology;  Laterality: Right;  US  00:58.5 CDE 6.70 Fluid Pack [place] # [id number]   CHOLECYSTECTOMY N/A 06/13/2018   Procedure: LAPAROSCOPIC CHOLECYSTECTOMY;  Surgeon: Belinda Cough, MD;  Location: [facility] OR;  Service: General;  Laterality: N/A;   COLONOSCOPY  7/12   normal (hx of polyps in past)    CT SCAN  3/12    outside hosp- lung nodule and gallstones   ERCP N/A 06/12/2018   Procedure: ENDOSCOPIC RETROGRADE CHOLANGIOPANCREATOGRAPHY (ERCP);  Surgeon: Rollin Dover, MD;  Location: [facility] ENDOSCOPY;  Service: Endoscopy;  Laterality: N/A;   FINGER SURGERY Left 1959 & 1976   knot on index   KIDNEY DONATION Left 1989   LUMBAR LAMINECTOMY/DECOMPRESSION MICRODISCECTOMY  1999   LUMBAR LAMINECTOMY/DECOMPRESSION MICRODISCECTOMY  01/31/2012   Procedure: LUMBAR LAMINECTOMY/DECOMPRESSION MICRODISCECTOMY 2 LEVELS;  Surgeon: Alm GORMAN Molt, MD;  Location: [facility] NEURO ORS;  Service: Neurosurgery;  Laterality: N/A;  Thoracic twelve-lumbar one, lumbar one-two laminectomy    POSTERIOR LAMINECTOMY / DECOMPRESSION CERVICAL SPINE  1999   REMOVAL OF STONES  06/12/2018   Procedure: REMOVAL OF STONES;  Surgeon: Rollin Dover, MD;  Location: [facility] ENDOSCOPY;  Service: Endoscopy;;   SPHINCTEROTOMY  06/12/2018   Procedure: ANNETT;  Surgeon: Rollin Dover, MD;  Location: [facility] ENDOSCOPY;  Service: Endoscopy;;   TUBAL LIGATION  1976   UPPER GASTROINTESTINAL ENDOSCOPY     Social History   Tobacco Use   Smoking status: Never   Smokeless tobacco: Never  Vaping Use   Vaping status: Never Used  Substance Use Topics   Alcohol use: No    Alcohol/week: 0.0 standard drinks of alcohol   Drug use: No   Family History  Problem Relation Age of Onset   Arthritis Mother    Cancer Mother        uterine and mouth   Hyperlipidemia Mother    Stroke Mother    Hypertension Mother    Alcohol abuse Father    Diabetes Father    Cancer Sister        breast   Diabetes Sister    Hyperlipidemia Sister    Heart disease Sister    Hypertension Sister    Diabetes Sister    Cancer Brother        lung cancer   Kidney disease Brother    Diabetes Brother    Hyperlipidemia Brother    Kidney failure Brother    Diabetes Daughter    Heart attack Daughter    Addison's disease Other    Allergies  Allergen Reactions   Ace Inhibitors Swelling     REACTION: tongue swelling   Lisinopril Swelling    Swelling of tongue   Insulin  Detemir Itching   Nsaids Other (See Comments)    Renal issues   Codeine Nausea Only   Hydrochlorothiazide Other (See Comments)    Dizziness/funny feeling   Tylenol  [Acetaminophen ] Other (See Comments)    Fatty liver - use with caution   Current Outpatient Medications on File Prior to Visit  Medication Sig Dispense Refill   atorvastatin  (LIPITOR) 80 MG tablet Take 1 tablet (80 mg total) by mouth daily. 90 tablet 3   BIOTIN 5000 PO Take 5,000 mg by mouth daily.     carvedilol  (COREG ) 12.5 MG tablet Take 1 tablet (12.5 mg total) by mouth 2 (two) times daily with a meal. 180 tablet 1   cetirizine (ZYRTEC) 10 MG tablet Take 10 mg by mouth daily.     clotrimazole-betamethasone (LOTRISONE) cream Apply 1 application topically as needed (groin).      Continuous Glucose Sensor (FREESTYLE LIBRE 3 SENSOR) MISC USE TO CHECK GLUCOSE CONTINOUSLY. CHANGE EVERY 14 DAYS 6 each 0   dapagliflozin propanediol (FARXIGA) 5 MG TABS tablet Take 5 mg by mouth daily.     famotidine  (PEPCID ) 20 MG tablet Take 1 tablet by mouth once daily 90 tablet 0   gabapentin  (NEURONTIN ) 300 MG capsule TAKE 1 CAPSULE BY MOUTH THREE TIMES DAILY 270 capsule 2   hydrALAZINE  (APRESOLINE ) 50 MG tablet Take 1 tablet (50 mg total) by mouth 3 (three) times daily. (Patient taking differently: Take 50 mg by mouth in the morning and at bedtime.) 270 tablet 3   hydrocortisone  2.5 % cream Apply topically 3 (three) times daily as needed. 28 g 3   insulin  degludec (TRESIBA) 100 UNIT/ML FlexTouch Pen Inject 26 Units into  the skin at bedtime.     isosorbide  mononitrate (IMDUR ) 60 MG 24 hr tablet Take 1 tablet (60 mg total) by mouth in the morning and at bedtime. 180 tablet 3   LORazepam  (ATIVAN ) 0.5 MG tablet Take 1 tablet by mouth every 8 (eight) hours as needed.     nystatin cream (MYCOSTATIN) Apply 1 application topically 2 (two) times daily as needed  (irritation.).      pantoprazole  (PROTONIX ) 40 MG tablet Take 1 tablet by mouth once daily 90 tablet 1   Respiratory Therapy Supplies (NEBULIZER/TUBING/MOUTHPIECE) KIT 1 each by Does not apply route 3 (three) times daily as needed. 1 kit 0   Rivaroxaban (XARELTO) 15 MG TABS tablet Take 15 mg by mouth daily with supper.     Semaglutide, 2 MG/DOSE, (OZEMPIC, 2 MG/DOSE,) 8 MG/3ML SOPN Inject 2 mg into the skin once a week.     traMADol  (ULTRAM ) 50 MG tablet Take 1-2 tablets (50-100 mg total) by mouth every 6 (six) hours as needed for severe pain (pain score 7-10). 60 tablet 0   triamcinolone  cream (KENALOG ) 0.1 % Apply 1 Application topically 2 (two) times daily as needed. 45 g 1   vitamin B-12 (CYANOCOBALAMIN ) 1000 MCG tablet Take 500 mcg by mouth daily.     VITAMIN D  PO Take by mouth.     No current facility-administered medications on file prior to visit.    Review of Systems  Constitutional:  Positive for activity change, appetite change and fatigue. Negative for fever and unexpected weight change.       Poor appetite   HENT:  Negative for congestion, ear pain, rhinorrhea, sinus pressure and sore throat.   Eyes:  Negative for pain, redness and visual disturbance.  Respiratory:  Negative for cough, shortness of breath and wheezing.   Cardiovascular:  Negative for chest pain and palpitations.  Gastrointestinal:  Negative for abdominal pain, blood in stool, constipation and diarrhea.  Endocrine: Negative for polydipsia and polyuria.  Genitourinary:  Negative for dysuria, frequency and urgency.  Musculoskeletal:  Positive for arthralgias and back pain. Negative for myalgias.  Skin:  Negative for pallor and rash.  Allergic/Immunologic: Negative for environmental allergies.  Neurological:  Positive for light-headedness. Negative for dizziness, tremors, seizures, syncope, facial asymmetry, speech difficulty, weakness and headaches.  Hematological:  Negative for adenopathy. Does not bruise/bleed  easily.  Psychiatric/Behavioral:  Negative for decreased concentration and dysphoric mood. The patient is not nervous/anxious.        Objective:   Physical Exam Constitutional:      General: She is not in acute distress.    Appearance: Normal appearance. She is well-developed and normal weight. She is not ill-appearing or diaphoretic.  HENT:     Head: Normocephalic and atraumatic.     Mouth/Throat:     Mouth: Mucous membranes are moist.  Eyes:     Conjunctiva/sclera: Conjunctivae normal.     Pupils: Pupils are equal, round, and reactive to light.  Neck:     Thyroid : No thyromegaly.     Vascular: No carotid bruit or JVD.  Cardiovascular:     Rate and Rhythm: Normal rate and regular rhythm.     Heart sounds: Murmur heard.     No gallop.  Pulmonary:     Effort: Pulmonary effort is normal. No respiratory distress.     Breath sounds: Normal breath sounds. No wheezing or rales.  Abdominal:     General: There is no distension or abdominal bruit.  Palpations: Abdomen is soft.  Musculoskeletal:     Cervical back: Normal range of motion and neck supple.     Right lower leg: No edema.     Left lower leg: No edema.  Lymphadenopathy:     Cervical: No cervical adenopathy.  Skin:    General: Skin is warm and dry.     Coloration: Skin is not pale.     Findings: No rash.  Neurological:     Mental Status: She is alert.     Cranial Nerves: No cranial nerve deficit, dysarthria or facial asymmetry.     Motor: No atrophy, abnormal muscle tone, seizure activity or pronator drift.     Coordination: Coordination normal. Finger-Nose-Finger Test normal.     Deep Tendon Reflexes: Reflexes are normal and symmetric. Reflexes normal.     Comments: Pt reports light headedness initially on standing   Generally weak (not focal)   Needs one person assist and cane to safely walk   Rhomberg-not focal   Psychiatric:        Mood and Affect: Mood normal.           Assessment & Plan:    Problem List Items Addressed This Visit       Cardiovascular and Mediastinum   Hypotension   Suspect this is worsening her positional light headedness BP: (!) 118/50  Was lower at a recent oncology visit as well   We held her amlodpine 2.5 mg  She continues to take  Coreg  12.5 g tid   Imdur  60 mg bid Hydralazine  50 mg bid (instead of tid)   In setting of cardiomyopathy, will reach out to cardiology for guidance to see what may be safe to reduce   Also discussed gabapentin  ant tramadol  which can both affect blood pressure and cause dizziness Reassuring exam today        Genitourinary   Stage 3 chronic kidney disease, unspecified whether stage 3a or 3b CKD (HCC)   Strongly encouraged better hydration again  Discussed importance of fluid intake for renal health  Last GFR was 34 Cr 1.5  Continues nephrology care         Other   Light headed - Primary   Likely due to hypotension  Holding amlodpine Will reach out to cardiology to see if coreg , imdur  or hydralazine  can be adjusted   Also discussed minimizing gabapentin  and tramadol  -? If more sensitive to these with age   Strongly encouraged better hydration - she struggled with this and discussed ways to trouble shoot Also not eating much -which can lead to muscle loss/frailty / balance issues  Encouraged to try and keep up a better meal schedule      RESOLVED: Cellulitis of left leg   Wound site is healed and dry -no signs of infection  Reassuring

## 2024-02-03 NOTE — Assessment & Plan Note (Signed)
 Wound site is healed and dry -no signs of infection  Reassuring

## 2024-02-03 NOTE — Assessment & Plan Note (Signed)
 Suspect this is worsening her positional light headedness BP: (!) 118/50  Was lower at a recent oncology visit as well   We held her amlodpine 2.5 mg  She continues to take  Coreg  12.5 g tid   Imdur  60 mg bid Hydralazine  50 mg bid (instead of tid)   In setting of cardiomyopathy, will reach out to cardiology for guidance to see what may be safe to reduce   Also discussed gabapentin  ant tramadol  which can both affect blood pressure and cause dizziness Reassuring exam today

## 2024-02-04 ENCOUNTER — Telehealth: Payer: Self-pay | Admitting: Family Medicine

## 2024-02-04 NOTE — Telephone Encounter (Signed)
 I checked in with Dr Gollan about the low blood pressure   Per message  Perla, Evalene PARAS, MD  Magdalene Tardiff, Laine LABOR, MD Amlodipine  would be a good 1 to hold If pressure continues to run low and she is hydrating, hydralazine  could be cut in half next Thx TGollan     Please advised pt to cut hydralazine  in 1/2 and take 25 mg bid (instead of 50) If any problems with that let us  know Please change on med list  Let me know next week if you feel any better/less light headed

## 2024-02-05 NOTE — Telephone Encounter (Signed)
 Pt notified of Dr. Randeen and Dr. Tresia comments. Pt will hold/ reduce meds and updated us  next week   Med list updated

## 2024-02-16 ENCOUNTER — Other Ambulatory Visit: Payer: Self-pay | Admitting: Pharmacist

## 2024-02-16 NOTE — Progress Notes (Signed)
 Brief Telephone Documentation Reason for Call: Patient left message regarding question for pharmacist  Summary of Call: Reports she has misplaced her Libre meter. Has been unable to find it over the past several days and is hoping to get a replacement reader. She estimates her current reader is >87 year old.  Provided patient with [employer] customer support line. They will usually replace readers up to every 12 months. This would be shipped directly to patient's home. She verbalizes understanding.  Patient will call once she receives her her reader. Offered phone or office visit if she would like assistance getting it set up again.   Call [employer] Customer Care Team at [phone] Available 7 days a week from 8AM-8PM EST, excluding holidays Online: [email or web address]  [name], PharmD Clinical Pharmacist [facility] [phone]

## 2024-02-17 ENCOUNTER — Ambulatory Visit: Attending: Medical | Admitting: Medical

## 2024-02-17 ENCOUNTER — Encounter: Payer: Self-pay | Admitting: Cardiovascular Disease

## 2024-02-17 ENCOUNTER — Other Ambulatory Visit: Payer: Self-pay | Admitting: Medical

## 2024-02-17 ENCOUNTER — Encounter: Payer: Self-pay | Admitting: Medical

## 2024-02-17 VITALS — BP 91/52 | HR 98 | Ht 61.0 in | Wt 146.0 lb

## 2024-02-17 DIAGNOSIS — E875 Hyperkalemia: Secondary | ICD-10-CM | POA: Diagnosis not present

## 2024-02-17 DIAGNOSIS — N183 Chronic kidney disease, stage 3 unspecified: Secondary | ICD-10-CM | POA: Diagnosis not present

## 2024-02-17 DIAGNOSIS — I480 Paroxysmal atrial fibrillation: Secondary | ICD-10-CM

## 2024-02-17 DIAGNOSIS — Z79899 Other long term (current) drug therapy: Secondary | ICD-10-CM | POA: Diagnosis not present

## 2024-02-17 DIAGNOSIS — I428 Other cardiomyopathies: Secondary | ICD-10-CM | POA: Diagnosis not present

## 2024-02-17 DIAGNOSIS — E7800 Pure hypercholesterolemia, unspecified: Secondary | ICD-10-CM | POA: Diagnosis not present

## 2024-02-17 DIAGNOSIS — Z8673 Personal history of transient ischemic attack (TIA), and cerebral infarction without residual deficits: Secondary | ICD-10-CM

## 2024-02-17 DIAGNOSIS — I959 Hypotension, unspecified: Secondary | ICD-10-CM

## 2024-02-17 DIAGNOSIS — E119 Type 2 diabetes mellitus without complications: Secondary | ICD-10-CM

## 2024-02-17 DIAGNOSIS — I5022 Chronic systolic (congestive) heart failure: Secondary | ICD-10-CM

## 2024-02-17 MED ORDER — ISOSORBIDE MONONITRATE ER 30 MG PO TB24: 30.0000 mg | ORAL_TABLET | Freq: Two times a day (BID) | ORAL | 3 refills | Status: DC

## 2024-02-17 NOTE — Progress Notes (Signed)
 Cardiology Office Note   Date:  02/17/2024  ID:  KAARIN PARDY, DOB 1934/11/25, MRN [id number] PCP: Randeen Laine LABOR, MD  [facility] Providers Cardiologist:  None    History of Present Illness Morgan Hubbard is a 89 y.o. 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  Patient has follows with [facility] for EP/device management.  She sees Dr. Gollan once a year.   Echo in 2009 showed LVEF 30% Cath in 2012 showed no CAD.  Ejection fraction  15-20% in 2015 S/p CRT-D May 2016 Up to 35% in August 2016 Up to 40% in 2017 Up to 55% in 06/2017 40 to 45% in 06/2018 45 to 50% in June 2023  Today, the patient reports hypotension. She feels tired and has low blood pressure. She saw PCP who decreased hydralazine . She takes hydralazine  25mg  BID. She is taking Imdur  60mg  daily. She denies chest pain. She feels SOB from feeling tired. She did take her medications this morning. No lower leg edema.   Studies Reviewed EKG Interpretation Date/Time:  Tuesday February 17 2024 10:58:12 EDT Ventricular Rate:  98 PR Interval:  148 QRS Duration:  126 QT Interval:  466 QTC Calculation: 594 R Axis:   140  Text Interpretation: Atrial-sensed ventricular-paced rhythm with frequent Premature ventricular complexes When compared with ECG of 04-Jul-2023 12:55, Premature ventricular complexes are now Present Vent. rate has increased BY  36 BPM Confirmed by Franchester, Kweli Grassel ([id number]) on 02/17/2024 11:09:26 AM    Echo 2023  1. Abnormal septal motion . Left ventricular ejection fraction, by  estimation, is 45 to 50%. The left ventricle has mildly decreased  function. The left ventricle has no regional wall motion  abnormalities.  Left ventricular diastolic parameters are  consistent with Grade I diastolic dysfunction (impaired relaxation).  Elevated left ventricular end-diastolic pressure.   2. Pacer wires in RA/RV. Right ventricular systolic function is normal.  The right ventricular size is normal. There is mildly elevated pulmonary  artery systolic pressure.   3. The mitral valve is abnormal. Trivial mitral valve regurgitation. No  evidence of mitral stenosis. Moderate mitral annular calcification.   4. Tricuspid valve regurgitation is mild to moderate.   5. The aortic valve is tricuspid. There is mild calcification of the  aortic valve. Aortic valve regurgitation is trivial. Aortic valve  sclerosis is present, with no evidence of aortic valve stenosis.   6. The inferior vena cava is normal in size with greater than 50%  respiratory variability, suggesting right atrial pressure of 3 mmHg.   Echo 06/2018 Study Conclusions   - Left ventricle: The cavity size was normal. Systolic function was    mildly to moderately reduced. The estimated ejection fraction was    in the range of 40% to 45%. Diffuse hypokinesis. Features are    consistent with a pseudonormal left ventricular filling pattern,    with concomitant abnormal relaxation and increased filling    pressure (grade 2 diastolic dysfunction).  - Ventricular septum: Septal motion showed abnormal function,    dyssynergy, and paradox. These changes are consistent with    intraventricular conduction delay.  - Mitral valve: There was mild to moderate regurgitation directed    centrally.  - Right ventricle: Systolic  function was mildly reduced.  - Pulmonary arteries: Systolic pressure was moderately increased.    PA peak pressure: 54 mm Hg (S).  - Pericardium, extracardiac: A trivial pericardial effusion was    identified posterior to the heart. There was a left pleural    effusion.       Physical Exam VS:  BP (!) 91/52   Pulse 98   Ht 5'  1 (1.549 m)   Wt 146 lb (66.2 kg)   LMP 06/03/1992   SpO2 93%   BMI 27.59 kg/m        Wt Readings from Last 3 Encounters:  02/17/24 146 lb (66.2 kg)  02/03/24 145 lb 6 oz (65.9 kg)  01/15/24 143 lb (64.9 kg)    GEN: Well nourished, well developed in no acute distress NECK: No JVD; No carotid bruits CARDIAC: RRR, no murmurs, rubs, gallops RESPIRATORY:  Clear to auscultation without rales, wheezing or rhonchi  ABDOMEN: Soft, non-tender, non-distended EXTREMITIES:  No edema; No deformity   ASSESSMENT AND PLAN  Hypotension Patient reports low blood pressure for the last few weeks.  PCP decreased hydralazine  to 25 mg twice daily.  She is also taking Coreg  12.5 mg twice daily and Imdur  60 mg daily.  Blood pressure today is 91/52, she did have her medications this morning.  I will stop hydralazine  and decrease Imdur  to 30 mg daily.  I recommended checking blood pressure at home and good hydration.  I will check a CBC and a BMET.  Hypercholesterolemia LDL 48, TG 161, TC 113, HDL 33. Continue Lipitor  80mg  daily.   NICM HFimpEF S/p CRT-D Patient has a history of nonischemic cardiomyopathy with remote left heart cath showing normal coronaries.  Pump function improved on echo in 2019 to 50%.  Most recent echo in 2023 showed EF of 45 to 50%, grade 1 diastolic dysfunction, trivial MR, moderate mitral annular calcification, mild to moderate TR, mild calcification of the aortic valve, trivial AI. The patient is euvolemic on exam. I will update an echocardiogram.   CKD stage 3 Hyperkalemia Most recent BMP showed K of 5.2 and serum creatinine of 2.0.  I will repeat a BMP today.  DM2 Most recent A1C 9.4.  H/o stroke Paroxysmal Afib Continue Xarelto 15mg  daily. Continue Coreg  12.5mg  daily. She follows at [facility] for EP.       Dispo: follow-up in 1-2 months  Signed, Lilianne Delair VEAR Fishman, PA-C

## 2024-02-17 NOTE — Patient Instructions (Addendum)
 Medication Instructions:  Your physician recommends the following medication changes.  STOP TAKING: Hydralazine    DECREASE: Imdur  to 30 mg by mouth twice daily    *If you need a refill on your cardiac medications before your next appointment, please call your pharmacy*  Lab Work: Your provider would like for you to have following labs drawn today CBC, BMP.     Testing/Procedures: Your physician has requested that you have an echocardiogram. Echocardiography is a painless test that uses sound waves to create images of your heart. It provides your doctor with information about the size and shape of your heart and how well your heart's chambers and valves are working.   You may receive an ultrasound enhancing agent through an IV if needed to better visualize your heart during the echo. This procedure takes approximately one hour.  There are no restrictions for this procedure.  This will take place at [address] [facility]) [address], [place] [address]  Please note: We ask at that you not bring children with you during ultrasound (echo/ vascular) testing. Due to room size and safety concerns, children are not allowed in the ultrasound rooms during exams. Our front office staff cannot provide observation of children in our lobby area while testing is being conducted. An adult accompanying a patient to their appointment will only be allowed in the ultrasound room at the discretion of the ultrasound technician under special circumstances. We apologize for any inconvenience.   Follow-Up: At [facility], you and your health needs are our priority.  As part of our continuing mission to provide you with exceptional heart care, our providers are all part of one team.  This team includes your primary Cardiologist (physician) and Advanced Practice Providers or APPs (Physician Assistants and Nurse Practitioners) who all work together to provide you with the care you need, when  you need it.  Your next appointment:   1-2 month(s)  Provider:   Mikey Fishman, PA-C

## 2024-02-18 ENCOUNTER — Encounter: Payer: Self-pay | Admitting: Internal Medicine

## 2024-02-18 ENCOUNTER — Other Ambulatory Visit: Payer: Self-pay

## 2024-02-18 ENCOUNTER — Encounter: Payer: Self-pay | Admitting: Pharmacist

## 2024-02-18 ENCOUNTER — Inpatient Hospital Stay
Admission: EM | Admit: 2024-02-18 | Discharge: 2024-02-21 | DRG: 378 | Disposition: A | Discharge status: Home / Self Care | Attending: Internal Medicine | Admitting: Internal Medicine

## 2024-02-18 ENCOUNTER — Telehealth: Payer: Self-pay | Admitting: Family Medicine

## 2024-02-18 ENCOUNTER — Ambulatory Visit: Payer: Self-pay | Admitting: Medical

## 2024-02-18 ENCOUNTER — Telehealth: Payer: Self-pay | Admitting: *Deleted

## 2024-02-18 DIAGNOSIS — K219 Gastro-esophageal reflux disease without esophagitis: Secondary | ICD-10-CM | POA: Diagnosis not present

## 2024-02-18 DIAGNOSIS — E1169 Type 2 diabetes mellitus with other specified complication: Secondary | ICD-10-CM | POA: Diagnosis present

## 2024-02-18 DIAGNOSIS — K5520 Angiodysplasia of colon without hemorrhage: Secondary | ICD-10-CM

## 2024-02-18 DIAGNOSIS — K922 Gastrointestinal hemorrhage, unspecified: Secondary | ICD-10-CM | POA: Diagnosis not present

## 2024-02-18 DIAGNOSIS — Z8249 Family history of ischemic heart disease and other diseases of the circulatory system: Secondary | ICD-10-CM | POA: Diagnosis not present

## 2024-02-18 DIAGNOSIS — E1129 Type 2 diabetes mellitus with other diabetic kidney complication: Secondary | ICD-10-CM | POA: Diagnosis present

## 2024-02-18 DIAGNOSIS — Z83438 Family history of other disorder of lipoprotein metabolism and other lipidemia: Secondary | ICD-10-CM

## 2024-02-18 DIAGNOSIS — E1140 Type 2 diabetes mellitus with diabetic neuropathy, unspecified: Secondary | ICD-10-CM | POA: Diagnosis present

## 2024-02-18 DIAGNOSIS — Z823 Family history of stroke: Secondary | ICD-10-CM

## 2024-02-18 DIAGNOSIS — Z888 Allergy status to other drugs, medicaments and biological substances status: Secondary | ICD-10-CM

## 2024-02-18 DIAGNOSIS — Z7901 Long term (current) use of anticoagulants: Secondary | ICD-10-CM

## 2024-02-18 DIAGNOSIS — Z961 Presence of intraocular lens: Secondary | ICD-10-CM | POA: Diagnosis present

## 2024-02-18 DIAGNOSIS — Z833 Family history of diabetes mellitus: Secondary | ICD-10-CM | POA: Diagnosis not present

## 2024-02-18 DIAGNOSIS — K449 Diaphragmatic hernia without obstruction or gangrene: Secondary | ICD-10-CM | POA: Diagnosis not present

## 2024-02-18 DIAGNOSIS — Z8719 Personal history of other diseases of the digestive system: Secondary | ICD-10-CM | POA: Diagnosis present

## 2024-02-18 DIAGNOSIS — I10 Essential (primary) hypertension: Secondary | ICD-10-CM | POA: Diagnosis present

## 2024-02-18 DIAGNOSIS — K5730 Diverticulosis of large intestine without perforation or abscess without bleeding: Secondary | ICD-10-CM | POA: Diagnosis not present

## 2024-02-18 DIAGNOSIS — I129 Hypertensive chronic kidney disease with stage 1 through stage 4 chronic kidney disease, or unspecified chronic kidney disease: Secondary | ICD-10-CM | POA: Diagnosis not present

## 2024-02-18 DIAGNOSIS — E785 Hyperlipidemia, unspecified: Secondary | ICD-10-CM | POA: Diagnosis not present

## 2024-02-18 DIAGNOSIS — Z85828 Personal history of other malignant neoplasm of skin: Secondary | ICD-10-CM

## 2024-02-18 DIAGNOSIS — Z811 Family history of alcohol abuse and dependence: Secondary | ICD-10-CM

## 2024-02-18 DIAGNOSIS — Z794 Long term (current) use of insulin: Secondary | ICD-10-CM

## 2024-02-18 DIAGNOSIS — D631 Anemia in chronic kidney disease: Secondary | ICD-10-CM | POA: Diagnosis not present

## 2024-02-18 DIAGNOSIS — N1832 Chronic kidney disease, stage 3b: Secondary | ICD-10-CM | POA: Diagnosis not present

## 2024-02-18 DIAGNOSIS — K640 First degree hemorrhoids: Secondary | ICD-10-CM | POA: Diagnosis not present

## 2024-02-18 DIAGNOSIS — Z886 Allergy status to analgesic agent status: Secondary | ICD-10-CM

## 2024-02-18 DIAGNOSIS — K5521 Angiodysplasia of colon with hemorrhage: Principal | ICD-10-CM | POA: Diagnosis present

## 2024-02-18 DIAGNOSIS — I5022 Chronic systolic (congestive) heart failure: Secondary | ICD-10-CM | POA: Diagnosis not present

## 2024-02-18 DIAGNOSIS — R5383 Other fatigue: Secondary | ICD-10-CM | POA: Diagnosis present

## 2024-02-18 DIAGNOSIS — G893 Neoplasm related pain (acute) (chronic): Secondary | ICD-10-CM | POA: Diagnosis present

## 2024-02-18 DIAGNOSIS — I130 Hypertensive heart and chronic kidney disease with heart failure and stage 1 through stage 4 chronic kidney disease, or unspecified chronic kidney disease: Secondary | ICD-10-CM | POA: Diagnosis not present

## 2024-02-18 DIAGNOSIS — I4891 Unspecified atrial fibrillation: Secondary | ICD-10-CM | POA: Diagnosis not present

## 2024-02-18 DIAGNOSIS — D124 Benign neoplasm of descending colon: Secondary | ICD-10-CM | POA: Diagnosis not present

## 2024-02-18 DIAGNOSIS — E875 Hyperkalemia: Secondary | ICD-10-CM | POA: Diagnosis not present

## 2024-02-18 DIAGNOSIS — Z79899 Other long term (current) drug therapy: Secondary | ICD-10-CM | POA: Diagnosis not present

## 2024-02-18 DIAGNOSIS — K635 Polyp of colon: Secondary | ICD-10-CM | POA: Diagnosis present

## 2024-02-18 DIAGNOSIS — Z885 Allergy status to narcotic agent status: Secondary | ICD-10-CM | POA: Diagnosis not present

## 2024-02-18 DIAGNOSIS — E1122 Type 2 diabetes mellitus with diabetic chronic kidney disease: Secondary | ICD-10-CM | POA: Diagnosis not present

## 2024-02-18 DIAGNOSIS — D62 Acute posthemorrhagic anemia: Secondary | ICD-10-CM | POA: Diagnosis not present

## 2024-02-18 DIAGNOSIS — Z9842 Cataract extraction status, left eye: Secondary | ICD-10-CM

## 2024-02-18 DIAGNOSIS — Z7985 Long-term (current) use of injectable non-insulin antidiabetic drugs: Secondary | ICD-10-CM

## 2024-02-18 DIAGNOSIS — Z8261 Family history of arthritis: Secondary | ICD-10-CM

## 2024-02-18 DIAGNOSIS — D509 Iron deficiency anemia, unspecified: Secondary | ICD-10-CM | POA: Diagnosis not present

## 2024-02-18 DIAGNOSIS — G629 Polyneuropathy, unspecified: Secondary | ICD-10-CM

## 2024-02-18 DIAGNOSIS — I428 Other cardiomyopathies: Secondary | ICD-10-CM | POA: Diagnosis not present

## 2024-02-18 DIAGNOSIS — D649 Anemia, unspecified: Secondary | ICD-10-CM | POA: Diagnosis not present

## 2024-02-18 DIAGNOSIS — Z801 Family history of malignant neoplasm of trachea, bronchus and lung: Secondary | ICD-10-CM

## 2024-02-18 DIAGNOSIS — Z950 Presence of cardiac pacemaker: Secondary | ICD-10-CM

## 2024-02-18 DIAGNOSIS — R0609 Other forms of dyspnea: Secondary | ICD-10-CM | POA: Diagnosis present

## 2024-02-18 DIAGNOSIS — Z841 Family history of disorders of kidney and ureter: Secondary | ICD-10-CM

## 2024-02-18 DIAGNOSIS — N184 Chronic kidney disease, stage 4 (severe): Secondary | ICD-10-CM

## 2024-02-18 DIAGNOSIS — Z8673 Personal history of transient ischemic attack (TIA), and cerebral infarction without residual deficits: Secondary | ICD-10-CM

## 2024-02-18 DIAGNOSIS — Z860100 Personal history of colon polyps, unspecified: Secondary | ICD-10-CM

## 2024-02-18 LAB — CBC
HCT: 23.3 % — ABNORMAL LOW (ref 36.0–46.0)
Hematocrit: 24.5 % — ABNORMAL LOW (ref 34.0–46.6)
Hemoglobin: 7.2 g/dL — ABNORMAL LOW (ref 11.1–15.9)
Hemoglobin: 7.4 g/dL — ABNORMAL LOW (ref 12.0–15.0)
MCH: 27.9 pg (ref 26.6–33.0)
MCH: 28 pg (ref 26.0–34.0)
MCHC: 29.4 g/dL — ABNORMAL LOW (ref 31.5–35.7)
MCHC: 31.8 g/dL (ref 30.0–36.0)
MCV: 95 fL (ref 79–97)
MCV: 88.3 fL (ref 80.0–100.0)
Platelets: 195 x10E3/uL (ref 150–450)
Platelets: 221 K/uL (ref 150–400)
RBC: 2.58 x10E6/uL — CL (ref 3.77–5.28)
RBC: 2.64 MIL/uL — ABNORMAL LOW (ref 3.87–5.11)
RDW: 12.7 % (ref 11.7–15.4)
RDW: 13.3 % (ref 11.5–15.5)
WBC: 6.5 x10E3/uL (ref 3.4–10.8)
WBC: 6.2 K/uL (ref 4.0–10.5)
nRBC: 0 % (ref 0.0–0.2)

## 2024-02-18 LAB — COMPREHENSIVE METABOLIC PANEL WITH GFR
ALT: 13 U/L (ref 0–44)
AST: 18 U/L (ref 15–41)
Albumin: 3.1 g/dL — ABNORMAL LOW (ref 3.5–5.0)
Alkaline Phosphatase: 81 U/L (ref 38–126)
Anion gap: 7 (ref 5–15)
BUN: 21 mg/dL (ref 8–23)
CO2: 23 mmol/L (ref 22–32)
Calcium: 8.3 mg/dL — ABNORMAL LOW (ref 8.9–10.3)
Chloride: 105 mmol/L (ref 98–111)
Creatinine, Ser: 1.55 mg/dL — ABNORMAL HIGH (ref 0.44–1.00)
GFR, Estimated: 32 mL/min — ABNORMAL LOW (ref >60)
Glucose, Bld: 413 mg/dL — ABNORMAL HIGH (ref 70–99)
Potassium: 5.3 mmol/L — ABNORMAL HIGH (ref 3.5–5.1)
Sodium: 135 mmol/L (ref 135–145)
Total Bilirubin: 0.6 mg/dL (ref 0.0–1.2)
Total Protein: 5.8 g/dL — ABNORMAL LOW (ref 6.5–8.1)

## 2024-02-18 LAB — BASIC METABOLIC PANEL WITH GFR
BUN/Creatinine Ratio: 14 (ref 12–28)
BUN: 21 mg/dL (ref 8–27)
CO2: 20 mmol/L (ref 20–29)
Calcium: 8.1 mg/dL — ABNORMAL LOW (ref 8.7–10.3)
Chloride: 100 mmol/L (ref 96–106)
Creatinine, Ser: 1.49 mg/dL — ABNORMAL HIGH (ref 0.57–1.00)
Glucose: 497 mg/dL — ABNORMAL HIGH (ref 70–99)
Potassium: 5.2 mmol/L (ref 3.5–5.2)
Sodium: 133 mmol/L — ABNORMAL LOW (ref 134–144)
eGFR: 34 mL/min/1.73 — ABNORMAL LOW (ref >59)

## 2024-02-18 LAB — PROTIME-INR
INR: 1.7 — ABNORMAL HIGH (ref 0.8–1.2)
Prothrombin Time: 20.8 s — ABNORMAL HIGH (ref 11.4–15.2)

## 2024-02-18 LAB — APTT: aPTT: 33 s (ref 24–36)

## 2024-02-18 LAB — CBG MONITORING, ED: Glucose-Capillary: 313 mg/dL — ABNORMAL HIGH (ref 70–99)

## 2024-02-18 LAB — PREPARE RBC (CROSSMATCH)

## 2024-02-18 LAB — TROPONIN I (HIGH SENSITIVITY): Troponin I (High Sensitivity): 9 ng/L (ref <18)

## 2024-02-18 MED ORDER — INSULIN ASPART 100 UNIT/ML IJ SOLN
0.0000 [IU] | Freq: Every day | INTRAMUSCULAR | Status: DC
  Administered 2024-02-18: 4 [IU] via SUBCUTANEOUS
  Administered 2024-02-20: 5 [IU] via SUBCUTANEOUS
  Filled 2024-02-18 (×2): qty 1

## 2024-02-18 MED ORDER — INSULIN GLARGINE 100 UNIT/ML ~~LOC~~ SOLN
10.0000 [IU] | Freq: Every day | SUBCUTANEOUS | Status: AC
Start: 2024-02-18 — End: 2024-02-18
  Administered 2024-02-18: 10 [IU] via SUBCUTANEOUS
  Filled 2024-02-18: qty 0.1

## 2024-02-18 MED ORDER — ONDANSETRON HCL 4 MG PO TABS: 4.0000 mg | ORAL_TABLET | Freq: Four times a day (QID) | ORAL | Status: DC | PRN

## 2024-02-18 MED ORDER — ATORVASTATIN CALCIUM 80 MG PO TABS
80.0000 mg | ORAL_TABLET | Freq: Every day | ORAL | Status: DC
  Administered 2024-02-18 – 2024-02-20 (×3): 80 mg via ORAL
  Filled 2024-02-18: qty 4
  Filled 2024-02-18 (×2): qty 1

## 2024-02-18 MED ORDER — VITAMIN B-12 1000 MCG PO TABS
500.0000 ug | ORAL_TABLET | Freq: Every day | ORAL | Status: DC
  Administered 2024-02-19 – 2024-02-21 (×3): 500 ug via ORAL
  Filled 2024-02-18 (×3): qty 1

## 2024-02-18 MED ORDER — GABAPENTIN 300 MG PO CAPS
300.0000 mg | ORAL_CAPSULE | Freq: Three times a day (TID) | ORAL | Status: DC
  Administered 2024-02-18 – 2024-02-21 (×8): 300 mg via ORAL
  Filled 2024-02-18 (×8): qty 1

## 2024-02-18 MED ORDER — INSULIN GLARGINE 100 UNIT/ML ~~LOC~~ SOLN
20.0000 [IU] | Freq: Every day | SUBCUTANEOUS | Status: DC
  Filled 2024-02-18: qty 0.2

## 2024-02-18 MED ORDER — SENNOSIDES-DOCUSATE SODIUM 8.6-50 MG PO TABS: 1.0000 | ORAL_TABLET | Freq: Every evening | ORAL | Status: DC | PRN

## 2024-02-18 MED ORDER — SODIUM CHLORIDE 0.9% IV SOLUTION: Freq: Once | INTRAVENOUS | Status: AC

## 2024-02-18 MED ORDER — INSULIN ASPART 100 UNIT/ML IJ SOLN
0.0000 [IU] | Freq: Three times a day (TID) | INTRAMUSCULAR | Status: DC
  Administered 2024-02-19: 0 [IU] via SUBCUTANEOUS
  Administered 2024-02-19: 3 [IU] via SUBCUTANEOUS
  Administered 2024-02-20: 2 [IU] via SUBCUTANEOUS
  Administered 2024-02-20: 9 [IU] via SUBCUTANEOUS
  Administered 2024-02-21: 5 [IU] via SUBCUTANEOUS
  Administered 2024-02-21: 9 [IU] via SUBCUTANEOUS
  Filled 2024-02-18 (×5): qty 1

## 2024-02-18 MED ORDER — HYDRALAZINE HCL 20 MG/ML IJ SOLN: 5.0000 mg | Freq: Four times a day (QID) | INTRAMUSCULAR | Status: DC | PRN

## 2024-02-18 MED ORDER — PANTOPRAZOLE SODIUM 40 MG IV SOLR
40.0000 mg | Freq: Once | INTRAVENOUS | Status: AC
  Administered 2024-02-18: 40 mg via INTRAVENOUS
  Filled 2024-02-18: qty 10

## 2024-02-18 MED ORDER — ACETAMINOPHEN 650 MG RE SUPP: 650.0000 mg | Freq: Four times a day (QID) | RECTAL | Status: DC | PRN

## 2024-02-18 MED ORDER — ISOSORBIDE MONONITRATE ER 30 MG PO TB24: 30.0000 mg | ORAL_TABLET | Freq: Every day | ORAL | 3 refills | Status: AC

## 2024-02-18 MED ORDER — ISOSORBIDE MONONITRATE ER 30 MG PO TB24
30.0000 mg | ORAL_TABLET | Freq: Every day | ORAL | Status: DC
Start: 2024-02-19 — End: 2024-02-21
  Administered 2024-02-19 – 2024-02-21 (×3): 30 mg via ORAL
  Filled 2024-02-18 (×3): qty 1

## 2024-02-18 MED ORDER — CARVEDILOL 12.5 MG PO TABS
12.5000 mg | ORAL_TABLET | Freq: Two times a day (BID) | ORAL | Status: DC
  Administered 2024-02-19 – 2024-02-21 (×5): 12.5 mg via ORAL
  Filled 2024-02-18 (×2): qty 1
  Filled 2024-02-18: qty 2
  Filled 2024-02-18 (×2): qty 1

## 2024-02-18 MED ORDER — PANTOPRAZOLE SODIUM 40 MG IV SOLR
40.0000 mg | Freq: Two times a day (BID) | INTRAVENOUS | Status: DC
  Administered 2024-02-18 – 2024-02-21 (×6): 40 mg via INTRAVENOUS
  Filled 2024-02-18 (×6): qty 10

## 2024-02-18 MED ORDER — ONDANSETRON HCL 4 MG/2ML IJ SOLN: 4.0000 mg | Freq: Four times a day (QID) | INTRAMUSCULAR | Status: DC | PRN

## 2024-02-18 MED ORDER — ACETAMINOPHEN 325 MG PO TABS: 650.0000 mg | ORAL_TABLET | Freq: Four times a day (QID) | ORAL | Status: DC | PRN

## 2024-02-18 NOTE — Progress Notes (Signed)
 Morgan Hubbard talked to her and it sounds like she was agreeable to go to ED now  I will route this back to myself so I can monitor epic

## 2024-02-18 NOTE — Assessment & Plan Note (Signed)
 Home carvedilol  12.5 mg p.o. twice daily with meals, Imdur  30 mg daily resumed on admission Hydralazine  5 mg IV every 6 hours as needed for SBP greater 170, 5 days ordered

## 2024-02-18 NOTE — Assessment & Plan Note (Signed)
 PDMP reviewed Home gabapentin 300 mg 3 times daily resumed

## 2024-02-18 NOTE — Assessment & Plan Note (Signed)
 Secondary to upper GI bleed in setting of Xarelto use Hold Xarelto on admission EDP ordered 1 unit PRBC for transfusion Recheck CBC in a.m.

## 2024-02-18 NOTE — Telephone Encounter (Signed)
 Copied from CRM [id number]. Topic: General - Other >> Feb 18, 2024  2:22 PM Thersia BROCKS wrote: Reason for CRM: Patient called in regarding needing to speak with Dr.Towers nurse, patient stated it is important , would like a callback as soon as possible  [phone]

## 2024-02-18 NOTE — Assessment & Plan Note (Signed)
 Home Farxiga will not be resumed on admission, a.m. team to resume when benefits outweigh the risk

## 2024-02-18 NOTE — Hospital Course (Signed)
 [name] is a 89 year old female with history of atrial fibrillation on Xarelto, hypertension, hyperlipidemia, insulin -dependent diabetes mellitus, who presents emergency department for chief concerns of abnormal labs outpatient.  Vitals in the ED showed t of 98, respirations 16, heart rate 64, blood pressure 119/49, SpO2 95% on room air.  Serum sodium is 135, potassium 5.3, chloride 105, bicarb 23, BUN of 21, serum creatinine 1.255, eGFR 32, nonfasting blood glucose 413, WBC 6.2, hemoglobin 7.4, platelets of 221.  Anion gap was 7.  HS troponin is 9.  ED treatment: Protonix  40 mg IV one-time dose.  1 unit PRBC have been ordered for transfusion.

## 2024-02-18 NOTE — Telephone Encounter (Signed)
 Addressed through Lindsay's note

## 2024-02-18 NOTE — Telephone Encounter (Addendum)
 Received med from pt assistance program.   3 pens of Tresiba 100 u/mL,  Lot# [id number], exp Date: 03/02/26 4 pens of Ozempic 8 mg/3 ml, Lot# [id number], exp Date: 10/01/26   Called pt to tell her that her Rxs are ready for pick up and pt said she only wants to pick up the [place], pt said the ozempic is to strong and is stopping her from eating at all. Pt said she will discuss with her Endo at [facility] but she doesn't want us  to order it. I advise pt I will have the [place] ready for pick up and make a note not to order any more Ozempic.  Will route to [place] and her team so they are aware to d/c the Ozempic

## 2024-02-18 NOTE — Telephone Encounter (Signed)
 Pt made aware and medication list updated   Furth, Cadence H, PA-C to Me (Selected Message)     02/18/24  1:02 PM No, just take Imdur  30mg  daily. No nighttime dose

## 2024-02-18 NOTE — Assessment & Plan Note (Signed)
 At baseline

## 2024-02-18 NOTE — Assessment & Plan Note (Signed)
 Home atorvastatin  80 mg nightly resume

## 2024-02-18 NOTE — Progress Notes (Signed)
 Brief Telephone Documentation Reason for Call: Patient left message regarding question   Summary of Call: Patient left message for clinic requesting call back, also left message on pharmacist line.   Patient notes she was called by Cardiology team and advised to go to the ED per CBC resulted from yesterday's labs.  Patient apprehensive, wanted to run this by her other providers. Was unsure if necessary.   Forwarded/direct message to PCP. Reviewed cardiology recommendation with patient. Agreeable to skip Xarelto today (takes in the evening as instructed).   Emphasized crucial need for further evaluation in the ED as soon as possible/potential severity of situation. Patient is agreeable, states she is dressed and ready to leave. Her husband is driving her to the ED.   CBC    Component Value Date/Time   WBC 6.5 02/17/2024 1153   RBC 2.58 (LL) 02/17/2024 1153   HGB 7.2 (L) 02/17/2024 1153   HCT 24.5 (L) 02/17/2024 1153   PLT 195 02/17/2024 1153   MCV 95 02/17/2024 1153   MCH 27.9 02/17/2024 1153   MCHC 29.4 (L) 02/17/2024 1153   RDW 12.7 02/17/2024 1153    [name], PharmD Clinical Pharmacist [facility] [phone]

## 2024-02-18 NOTE — Assessment & Plan Note (Signed)
 Suspect secondary to Xarelto use in setting of atrial fibrillation Will hold Xarelto on admission Status post 1 unit PRBC ordered by EDP for transfusion Continue Protonix  40 mg IV twice daily Gastroenterology service has been consulted Peripheral IV: Ensure and maintain 2 peripheral IV (peripheral large-bore) Recheck CBC in the a.m. Admit to PCU, inpatient

## 2024-02-18 NOTE — ED Triage Notes (Addendum)
 Pt c/o dizziness and fatigue x2 months. Pt endorses exertional dyspnea. Pt takes a blood thinner and reports dark stools for several weeks. Pt reports softer than normal BP. Pt reports hgb yesterday was 7.1.

## 2024-02-18 NOTE — Assessment & Plan Note (Addendum)
 Insulin -dependent diabetes mellitus Patient is on insulin  degludec 26 units nightly which is held on admission due to patient being clear liquid diet and n.p.o. after midnight Long-acting insulin  equivalent, 10 units nightly, one-time dose ordered A.m. team to reinitiate home long-acting insulin  dosing when benefits outweigh the risk Insulin  SSI with at bedtime coverage ordered

## 2024-02-18 NOTE — Assessment & Plan Note (Signed)
 PPI twice daily

## 2024-02-18 NOTE — ED Provider Notes (Signed)
 [facility] Provider Note    Event Date/Time   First MD Initiated Contact with Patient 02/18/24 1658     (approximate)   History   Chief Complaint Abnormal Lab   HPI  Morgan Hubbard is a 89 y.o. female with past medical history of hypertension, hyperlipidemia, diabetes, CHF, CKD, and atrial fibrillation on Xarelto who presents to the ED for abnormal labs.  Patient reports that for the past month she has been dealing with generalized weakness and fatigue as well as dyspnea on exertion.  She has noticed dark stools intermittently during this time, but has not noticed any blood in her stool and has not had any vomiting.  She denies any associated abdominal pain and has not had any pain in her chest, fever, or cough.  She was seen yesterday by her cardiology team, had labs performed that showed hemoglobin of 7.2.  She was subsequently referred to the ED for further evaluation.  She denies any history of GI bleeding, states her last colonoscopy was many years ago.     Physical Exam   Triage Vital Signs: ED Triage Vitals  Encounter Vitals Group     BP 02/18/24 1633 (!) 143/84     Girls Systolic BP Percentile --      Girls Diastolic BP Percentile --      Boys Systolic BP Percentile --      Boys Diastolic BP Percentile --      Pulse Rate 02/18/24 1633 74     Resp 02/18/24 1633 16     Temp 02/18/24 1633 98.3 F (36.8 C)     Temp Source 02/18/24 1633 Oral     SpO2 02/18/24 1633 98 %     Weight 02/18/24 1634 146 lb (66.2 kg)     Height 02/18/24 1634 5' 1 (1.549 m)     Head Circumference --      Peak Flow --      Pain Score --      Pain Loc --      Pain Education --      Exclude from Growth Chart --     Most recent vital signs: Vitals:   02/18/24 1633 02/18/24 1700  BP: (!) 143/84 (!) 119/49  Pulse: 74 64  Resp: 16   Temp: 98.3 F (36.8 C)   SpO2: 98% 95%    Constitutional: Alert and oriented.  Pale appearing. Eyes: Conjunctivae are  normal. Head: Atraumatic. Nose: No congestion/rhinnorhea. Mouth/Throat: Mucous membranes are moist.  Cardiovascular: Normal rate, regular rhythm. Grossly normal heart sounds.  2+ radial pulses bilaterally. Respiratory: Normal respiratory effort.  No retractions. Lungs CTAB. Gastrointestinal: Soft and nontender. No distention. Musculoskeletal: No lower extremity tenderness nor edema.  Neurologic:  Normal speech and language. No gross focal neurologic deficits are appreciated.    ED Results / Procedures / Treatments   Labs (all labs ordered are listed, but only abnormal results are displayed) Labs Reviewed  CBC - Abnormal; Notable for the following components:      Result Value   RBC 2.64 (*)    Hemoglobin 7.4 (*)    HCT 23.3 (*)    All other components within normal limits  COMPREHENSIVE METABOLIC PANEL WITH GFR - Abnormal; Notable for the following components:   Potassium 5.3 (*)    Glucose, Bld 413 (*)    Creatinine, Ser 1.55 (*)    Calcium  8.3 (*)    Total Protein 5.8 (*)    Albumin 3.1 (*)  GFR, Estimated 32 (*)    All other components within normal limits  PROTIME-INR  APTT  TYPE AND SCREEN  TROPONIN I (HIGH SENSITIVITY)    PROCEDURES:  Critical Care performed: Yes  .Critical Care  Performed by: Willo Dunnings, MD Authorized by: Willo Dunnings, MD   Critical care provider statement:    Critical care time (minutes):  30   Critical care time was exclusive of:  Separately billable procedures and treating other patients and teaching time   Critical care was necessary to treat or prevent imminent or life-threatening deterioration of the following conditions: Anemia.   Critical care was time spent personally by me on the following activities:  Development of treatment plan with patient or surrogate, discussions with consultants, evaluation of patient's response to treatment, examination of patient, ordering and review of laboratory studies, ordering and review of  radiographic studies, ordering and performing treatments and interventions, pulse oximetry, re-evaluation of patient's condition and review of old charts   I assumed direction of critical care for this patient from another provider in my specialty: no     Care discussed with: admitting provider      MEDICATIONS ORDERED IN ED: Medications  pantoprazole  (PROTONIX ) injection 40 mg (has no administration in time range)     IMPRESSION / MDM / ASSESSMENT AND PLAN / ED COURSE  I reviewed the triage vital signs and the nursing notes.                              89 y.o. female with past medical history of hypertension, hyperlipidemia, diabetes, CKD, CHF, atrial fibrillation on Xarelto who presents to the ED complaining of generalized weakness and dyspnea on exertion for the past month with low hemoglobin noted on outpatient labs.  Patient's presentation is most consistent with acute presentation with potential threat to life or bodily function.  Differential diagnosis includes, but is not limited to, anemia, electrolyte abnormality, AKI, upper GI bleed, lower GI bleed, ACS.  Patient nontoxic-appearing and in no acute distress, vital signs are unremarkable.  Labs confirm low hemoglobin at 7.4, however this is stable compared to yesterday, when it was 7.2.  Due to significant symptoms related to anemia, will transfuse 1 unit PRBCs.  She denies alcohol consumption, NSAID use, or steroid use.  We will give dose of IV Protonix .  Additional labs with stable CKD and no acute electrolyte abnormality or leukocytosis, LFTs unremarkable.  Case discussed with hospitalist for admission.      FINAL CLINICAL IMPRESSION(S) / ED DIAGNOSES   Final diagnoses:  Symptomatic anemia  Gastrointestinal hemorrhage, unspecified gastrointestinal hemorrhage type     Rx / DC Orders   ED Discharge Orders     None        Note:  This document was prepared using Dragon voice recognition software and may include  unintentional dictation errors.   Willo Dunnings, MD 02/18/24 1739

## 2024-02-18 NOTE — H&P (Signed)
 History and Physical   Morgan Hubbard FMW:[id number] DOB: 1934-09-06 DOA: 02/18/2024  PCP: Randeen Laine LABOR, MD  Outpatient Specialists: Dr. Damian Glenn clinic endocrinology Patient coming from: Home  I have personally briefly reviewed patient's old medical records in [facility] EMR.  Chief Concern: Abnormal labs   HPI: [name] is a 89 year old female with history of atrial fibrillation on Xarelto, hypertension, hyperlipidemia, insulin -dependent diabetes mellitus, who presents emergency department for chief concerns of abnormal labs outpatient.  Vitals in the ED showed t of 98, respirations 16, heart rate 64, blood pressure 119/49, SpO2 95% on room air.  Serum sodium is 135, potassium 5.3, chloride 105, bicarb 23, BUN of 21, serum creatinine 1.255, eGFR 32, nonfasting blood glucose 413, WBC 6.2, hemoglobin 7.4, platelets of 221.  Anion gap was 7.  HS troponin is 9.  ED treatment: Protonix  40 mg IV one-time dose.  1 unit PRBC have been ordered for transfusion. ------------------------------------ At bedside, patient able to tell me her first and last name, age, location, current calendar year.  She reports that she has been having worsening shortness of breath for the last few months however it has been worsening over the last month with gradually worsening over the last week.  She reports that she first noticed black stool about 1 month ago.  She reports the black stool is not consistent and instead is intermittent.  She reports she noted more black stool this past week.  She denies changes to her medication.  She endorses compliance with Xarelto and states that she has been on it for many years.  She reports she last took her evening dose of Xarelto on 02/17/2024.  She denies chest pain, abdominal pain, dysuria, hematuria, diarrhea, nausea, vomiting, syncope.  Social history: Lives at home with her husband.  She denies tobacco, EtOH, recreational drug use.  She is retired and  previously worked as a Dipl[employer] Services operational officer.  ROS: Constitutional: no weight change, no fever ENT/Mouth: no sore throat, no rhinorrhea Eyes: no eye pain, no vision changes Cardiovascular: no chest pain, no dyspnea,  no edema, no palpitations Respiratory: no cough, no sputum, no wheezing Gastrointestinal: no nausea, no vomiting, no diarrhea, no constipation, + black stool Genitourinary: no urinary incontinence, no dysuria, no hematuria Musculoskeletal: no arthralgias, no myalgias Skin: no skin lesions, no pruritus, Neuro: + weakness, no loss of consciousness, no syncope Psych: no anxiety, no depression, + decrease appetite Heme/Lymph: no bruising, no bleeding  ED Course: Discussed with EDP, patient requiring hospitalization for chief concerns of upper GI bleed.  Assessment/Plan  Principal Problem:   Upper GI bleed Active Problems:   Hyperkalemia   Symptomatic anemia   Type II diabetes mellitus with renal manifestations (HCC)   Neuropathy   Hyperlipidemia associated with type 2 diabetes mellitus (HCC)   Essential hypertension   GERD (gastroesophageal reflux disease)   CKD stage 3b, GFR 30-44 ml/min (HCC)   Assessment and Plan:  * Upper GI bleed Suspect secondary to Xarelto use in setting of atrial fibrillation Will hold Xarelto on admission Status post 1 unit PRBC ordered by EDP for transfusion Continue Protonix  40 mg IV twice daily Gastroenterology service has been consulted Peripheral IV: Ensure and maintain 2 peripheral IV (peripheral large-bore) Recheck CBC in the a.m. Admit to PCU, inpatient  Symptomatic anemia Secondary to upper GI bleed in setting of Xarelto use Hold Xarelto on admission EDP ordered 1 unit PRBC for transfusion Recheck CBC in a.m.  Hyperkalemia Home Farxiga will not be resumed  on admission, a.m. team to resume when benefits outweigh the risk  Neuropathy PDMP reviewed Home gabapentin  300 mg 3 times daily resumed  Type II diabetes mellitus with renal  manifestations (HCC) Insulin -dependent diabetes mellitus Patient is on insulin  degludec 26 units nightly which is held on admission due to patient being clear liquid diet and n.p.o. after midnight Long-acting insulin  equivalent, 10 units nightly, one-time dose ordered A.m. team to reinitiate home long-acting insulin  dosing when benefits outweigh the risk Insulin  SSI with at bedtime coverage ordered  CKD stage 3b, GFR 30-44 ml/min (HCC) At baseline  GERD (gastroesophageal reflux disease) PPI twice daily  Essential hypertension Home carvedilol  12.5 mg p.o. twice daily with meals, Imdur  30 mg daily resumed on admission Hydralazine  5 mg IV every 6 hours as needed for SBP greater 170, 5 days ordered  Hyperlipidemia associated with type 2 diabetes mellitus (HCC) Home atorvastatin  80 mg nightly resume  Chart reviewed.   DVT prophylaxis: Pharmacologic DVT not initiated on admission.  AM team to initiate pharmacologic DVT when the benefits outweigh the risk.  Code Status: full code  Diet: Clear liquid diet; n.p.o. after midnight Family Communication: Updated spouse at bedside with patient's permission Disposition Plan: Pending clinical course, pending GI evaluation Consults called: Gastroenterology Admission status: PCU, inpatient  Past Medical History:  Diagnosis Date   Arthritis    hands (01/13/2014)   Basal cell carcinoma 01/2014   bridge of nose   Cardiac LV ejection fraction >40%    it was 43 last year (01/13/2014)   Chronic lower back pain    CKD (chronic kidney disease), stage III (HCC)    acute on chronic stage III/notes 06/10/2018   Colon polyps    Expressive aphasia    3 times in the last week (01/13/2014)   GERD (gastroesophageal reflux disease)    Goiter past remote   treated with RI   Hyperlipidemia    Hyperpotassemia    Hypertension    Hypertonicity of bladder    Inflammatory and toxic neuropathy, unspecified    LBBB (left bundle branch block)    Migraine     stopped many years ago (01/13/2014)   Mini stroke 01/2014   Other primary cardiomyopathies    Overactive bladder    Presence of combination internal cardiac defibrillator (ICD) and pacemaker    Reflex sympathetic dystrophy, unspecified    Shingles    Shortness of breath    ambulation   Stroke (HCC)    Thyroid  disease    Type II diabetes mellitus (HCC)    Ulcer    Urine incontinence    Vertigo    hx of   Past Surgical History:  Procedure Laterality Date   BACK SURGERY     BI-VENTRICULAR PACEMAKER INSERTION (CRT-P)  10/2014   [facility]   BREAST CYST EXCISION Left 1959   CARDIAC CATHETERIZATION  several   [place]   CATARACT EXTRACTION W/ INTRAOCULAR LENS IMPLANT Left ~ 2005   several eye injections   CATARACT EXTRACTION W/PHACO Right 05/04/2020   Procedure: CATARACT EXTRACTION PHACO AND INTRAOCULAR LENS PLACEMENT (IOC) RIGHT DIABETIC;  Surgeon: Jaye Fallow, MD;  Location: [facility] ORS;  Service: Ophthalmology;  Laterality: Right;  US  00:58.5 CDE 6.70 Fluid Pack [place] # [id number]   CHOLECYSTECTOMY N/A 06/13/2018   Procedure: LAPAROSCOPIC CHOLECYSTECTOMY;  Surgeon: Belinda Cough, MD;  Location: [facility] OR;  Service: General;  Laterality: N/A;   COLONOSCOPY  7/12   normal (hx of polyps in past)    CT SCAN  3/12  outside hosp- lung nodule and gallstones   ERCP N/A 06/12/2018   Procedure: ENDOSCOPIC RETROGRADE CHOLANGIOPANCREATOGRAPHY (ERCP);  Surgeon: Rollin Dover, MD;  Location: [facility] ENDOSCOPY;  Service: Endoscopy;  Laterality: N/A;   FINGER SURGERY Left 1959 & 1976   knot on index   KIDNEY DONATION Left 1989   LUMBAR LAMINECTOMY/DECOMPRESSION MICRODISCECTOMY  1999   LUMBAR LAMINECTOMY/DECOMPRESSION MICRODISCECTOMY  01/31/2012   Procedure: LUMBAR LAMINECTOMY/DECOMPRESSION MICRODISCECTOMY 2 LEVELS;  Surgeon: Alm GORMAN Molt, MD;  Location: [facility] NEURO ORS;  Service: Neurosurgery;  Laterality: N/A;  Thoracic twelve-lumbar one, lumbar one-two laminectomy    POSTERIOR LAMINECTOMY /  DECOMPRESSION CERVICAL SPINE  1999   REMOVAL OF STONES  06/12/2018   Procedure: REMOVAL OF STONES;  Surgeon: Rollin Dover, MD;  Location: [facility] ENDOSCOPY;  Service: Endoscopy;;   SPHINCTEROTOMY  06/12/2018   Procedure: ANNETT;  Surgeon: Rollin Dover, MD;  Location: [facility] ENDOSCOPY;  Service: Endoscopy;;   TUBAL LIGATION  1976   UPPER GASTROINTESTINAL ENDOSCOPY     Social History:  reports that she has never smoked. She has never used smokeless tobacco. She reports that she does not drink alcohol and does not use drugs.  Allergies  Allergen Reactions   Ace Inhibitors Swelling    REACTION: tongue swelling   Lisinopril Swelling    Swelling of tongue   Insulin  Detemir Itching   Nsaids Other (See Comments)    Renal issues   Codeine Nausea Only   Hydrochlorothiazide Other (See Comments)    Dizziness/funny feeling   Tylenol  [Acetaminophen ] Other (See Comments)    Fatty liver - use with caution   Family History  Problem Relation Age of Onset   Arthritis Mother    Cancer Mother        uterine and mouth   Hyperlipidemia Mother    Stroke Mother    Hypertension Mother    Alcohol abuse Father    Diabetes Father    Cancer Sister        breast   Diabetes Sister    Hyperlipidemia Sister    Heart disease Sister    Hypertension Sister    Diabetes Sister    Cancer Brother        lung cancer   Kidney disease Brother    Diabetes Brother    Hyperlipidemia Brother    Kidney failure Brother    Diabetes Daughter    Heart attack Daughter    Addison's disease Other    Family history: Family history reviewed and not pertinent.  Prior to Admission medications   Medication Sig Start Date End Date Taking? Authorizing Provider  atorvastatin  (LIPITOR ) 80 MG tablet Take 1 tablet (80 mg total) by mouth daily. 02/18/23   Gollan, Timothy J, MD  BIOTIN 5000 PO Take 5,000 mg by mouth daily.    [provider]  carvedilol  (COREG ) 12.5 MG tablet Take 1 tablet (12.5 mg total) by mouth 2  (two) times daily with a meal. 09/12/23   Gollan, Evalene PARAS, MD  cetirizine (ZYRTEC) 10 MG tablet Take 10 mg by mouth daily.    [provider]  clotrimazole-betamethasone (LOTRISONE) cream Apply 1 application topically as needed (groin).  06/19/15   [provider]  Continuous Glucose Sensor (FREESTYLE LIBRE 3 SENSOR) MISC USE TO CHECK GLUCOSE CONTINOUSLY. CHANGE EVERY 14 DAYS 01/20/24   Corwin Antu, FNP  dapagliflozin propanediol (FARXIGA) 5 MG TABS tablet Take 5 mg by mouth daily.    [provider]  famotidine  (PEPCID ) 20 MG tablet Take 1 tablet  by mouth once daily 01/01/24   Tower, Laine LABOR, MD  gabapentin  (NEURONTIN ) 300 MG capsule TAKE 1 CAPSULE BY MOUTH THREE TIMES DAILY 04/29/23   Tower, Laine LABOR, MD  hydrocortisone  2.5 % cream Apply topically 3 (three) times daily as needed. 02/17/23   Jimmy Charlie FERNS, MD  insulin  degludec (TRESIBA) 100 UNIT/ML FlexTouch Pen Inject 26 Units into the skin at bedtime. 05/20/22   [provider]  isosorbide  mononitrate (IMDUR ) 30 MG 24 hr tablet Take 1 tablet (30 mg total) by mouth daily. 02/18/24   Furth, Cadence H, PA-C  LORazepam  (ATIVAN ) 0.5 MG tablet Take 1 tablet by mouth every 8 (eight) hours as needed. 03/10/23   [provider]  nystatin cream (MYCOSTATIN) Apply 1 application topically 2 (two) times daily as needed (irritation.).  12/15/19   [provider]  pantoprazole  (PROTONIX ) 40 MG tablet Take 1 tablet by mouth once daily 09/19/21   Tower, Laine LABOR, MD  Respiratory Therapy Supplies (NEBULIZER/TUBING/MOUTHPIECE) KIT 1 each by Does not apply route 3 (three) times daily as needed. 07/30/22   Dugal, Tabitha, FNP  Rivaroxaban (XARELTO) 15 MG TABS tablet Take 15 mg by mouth daily with supper. 12/20/22   [provider]  Semaglutide, 2 MG/DOSE, (OZEMPIC, 2 MG/DOSE,) 8 MG/3ML SOPN Inject 2 mg into the skin once a week.    Damian Therisa HERO, MD  traMADol  (ULTRAM ) 50 MG tablet Take 1-2 tablets (50-100 mg  total) by mouth every 6 (six) hours as needed for severe pain (pain score 7-10). 01/01/24   Tower, Laine LABOR, MD  triamcinolone  cream (KENALOG ) 0.1 % Apply 1 Application topically 2 (two) times daily as needed. 10/08/23   Jimmy Charlie FERNS, MD  vitamin B-12 (CYANOCOBALAMIN ) 1000 MCG tablet Take 500 mcg by mouth daily.    [provider]  VITAMIN D  PO Take by mouth.    [provider]   Physical Exam: Vitals:   02/18/24 1633 02/18/24 1634 02/18/24 1700 02/18/24 1800  BP: (!) 143/84  (!) 119/49 (!) 125/54  Pulse: 74  64 64  Resp: 16     Temp: 98.3 F (36.8 C)     TempSrc: Oral     SpO2: 98%  95% 96%  Weight:  66.2 kg    Height:  5' 1 (1.549 m)     Constitutional: appears frail, pale, mildly anxious, weak Eyes: PERRL, lids and conjunctivae normal ENMT: Mucous membranes are moist. Posterior pharynx clear of any exudate or lesions. Age-appropriate dentition. Hearing appropriate Neck: normal, supple, no masses, no thyromegaly Respiratory: clear to auscultation bilaterally, no wheezing, no crackles. Normal respiratory effort. No accessory muscle use.  Cardiovascular: Regular rate and rhythm, no murmurs / rubs / gallops. No extremity edema. 2+ pedal pulses. No carotid bruits.  Abdomen: no tenderness, no masses palpated, no hepatosplenomegaly. Bowel sounds positive.  Musculoskeletal: no clubbing / cyanosis. No joint deformity upper and lower extremities. Good ROM, no contractures, no atrophy. Normal muscle tone.  Skin: no rashes, lesions, ulcers. No induration.  Pale skin Neurologic: Sensation intact. Strength 5/5 in all 4.  Psychiatric: Normal judgment and insight. Alert and oriented x 3. Normal mood.   EKG: independently reviewed, showing atrial sensed ventricular paced rhythm with rate of 98, QTc 594  Chest x-ray on Admission: Not indicated at this time Labs on Admission: I have personally reviewed following labs  CBC: Recent Labs  Lab 02/17/24 1153 02/18/24 1638  WBC  6.5 6.2  HGB 7.2* 7.4*  HCT 24.5* 23.3*  MCV 95 88.3  PLT 195 221   Basic Metabolic Panel: Recent Labs  Lab 02/17/24 1153 02/18/24 1638  NA 133* 135  K 5.2 5.3*  CL 100 105  CO2 20 23  GLUCOSE 497* 413*  BUN 21 21  CREATININE 1.49* 1.55*  CALCIUM  8.1* 8.3*   GFR: Estimated Creatinine Clearance: 22.3 mL/min (A) (by C-G formula based on SCr of 1.55 mg/dL (H)).  Liver Function Tests: Recent Labs  Lab 02/18/24 1638  AST 18  ALT 13  ALKPHOS 81  BILITOT 0.6  PROT 5.8*  ALBUMIN 3.1*   Coagulation Profile: Recent Labs  Lab 02/18/24 1720  INR 1.7*   Urine analysis:    Component Value Date/Time   COLORURINE YELLOW 11/29/2021 1800   APPEARANCEUR CLEAR 11/29/2021 1800   LABSPEC 1.013 11/29/2021 1800   PHURINE 6.0 11/29/2021 1800   GLUCOSEU >=500 (A) 11/29/2021 1800   HGBUR NEGATIVE 11/29/2021 1800   BILIRUBINUR Negative 04/23/2023 1520   KETONESUR NEGATIVE 11/29/2021 1800   PROTEINUR Positive (A) 04/23/2023 1520   PROTEINUR 100 (A) 11/29/2021 1800   UROBILINOGEN 0.2 04/23/2023 1520   UROBILINOGEN 0.2 01/13/2014 1102   NITRITE Negative 04/23/2023 1520   NITRITE NEGATIVE 11/29/2021 1800   LEUKOCYTESUR Small (1+) (A) 04/23/2023 1520   LEUKOCYTESUR NEGATIVE 11/29/2021 1800   CRITICAL CARE Performed by: Dr. Sherre  Total critical care time: 32 minutes  Critical care time was exclusive of separately billable procedures and treating other patients.  Critical care was necessary to treat or prevent imminent or life-threatening deterioration.  Critical care was time spent personally by me on the following activities: development of treatment plan with patient and spouse, as well discussions with consultants, evaluation of patient's response to treatment, examination of patient, obtaining history from patient or surrogate, ordering and performing treatments and interventions, ordering and review of laboratory studies, ordering and review of radiographic studies, pulse  oximetry and re-evaluation of patient's condition.  This document was prepared using Dragon Voice Recognition software and may include unintentional dictation errors.  Dr. Sherre [facility]  If 7PM-7AM, please contact overnight-coverage provider If 7AM-7PM, please contact day attending provider [email or web address]  02/18/2024, 6:53 PM

## 2024-02-19 ENCOUNTER — Encounter
Admission: EM | Disposition: A | Discharge status: Home / Self Care | Payer: Self-pay | Source: Home / Self Care | Attending: Internal Medicine

## 2024-02-19 DIAGNOSIS — N1832 Chronic kidney disease, stage 3b: Secondary | ICD-10-CM | POA: Diagnosis not present

## 2024-02-19 DIAGNOSIS — E1129 Type 2 diabetes mellitus with other diabetic kidney complication: Secondary | ICD-10-CM

## 2024-02-19 DIAGNOSIS — E875 Hyperkalemia: Secondary | ICD-10-CM

## 2024-02-19 DIAGNOSIS — K922 Gastrointestinal hemorrhage, unspecified: Secondary | ICD-10-CM | POA: Diagnosis present

## 2024-02-19 DIAGNOSIS — D509 Iron deficiency anemia, unspecified: Secondary | ICD-10-CM

## 2024-02-19 LAB — BPAM RBC
Blood Product Expiration Date: 202510182359
ISSUE DATE / TIME: 202509171840
Unit Type and Rh: 5100

## 2024-02-19 LAB — CBC
HCT: 26.7 % — ABNORMAL LOW (ref 36.0–46.0)
Hemoglobin: 8.5 g/dL — ABNORMAL LOW (ref 12.0–15.0)
MCH: 28.2 pg (ref 26.0–34.0)
MCHC: 31.8 g/dL (ref 30.0–36.0)
MCV: 88.7 fL (ref 80.0–100.0)
Platelets: 212 K/uL (ref 150–400)
RBC: 3.01 MIL/uL — ABNORMAL LOW (ref 3.87–5.11)
RDW: 13.8 % (ref 11.5–15.5)
WBC: 7.3 K/uL (ref 4.0–10.5)
nRBC: 0 % (ref 0.0–0.2)

## 2024-02-19 LAB — GLUCOSE, CAPILLARY
Glucose-Capillary: 249 mg/dL — ABNORMAL HIGH (ref 70–99)
Glucose-Capillary: 145 mg/dL — ABNORMAL HIGH (ref 70–99)

## 2024-02-19 LAB — HEMOGLOBIN A1C
Hgb A1c MFr Bld: 8.2 % — ABNORMAL HIGH (ref 4.8–5.6)
Mean Plasma Glucose: 188.64 mg/dL

## 2024-02-19 LAB — TYPE AND SCREEN
ABO/RH(D): O POS
Antibody Screen: NEGATIVE
Unit division: 0

## 2024-02-19 LAB — BASIC METABOLIC PANEL WITH GFR
Anion gap: 6 (ref 5–15)
BUN: 19 mg/dL (ref 8–23)
CO2: 25 mmol/L (ref 22–32)
Calcium: 8.3 mg/dL — ABNORMAL LOW (ref 8.9–10.3)
Chloride: 110 mmol/L (ref 98–111)
Creatinine, Ser: 1.47 mg/dL — ABNORMAL HIGH (ref 0.44–1.00)
GFR, Estimated: 34 mL/min — ABNORMAL LOW
Glucose, Bld: 111 mg/dL — ABNORMAL HIGH (ref 70–99)
Potassium: 3.8 mmol/L (ref 3.5–5.1)
Sodium: 141 mmol/L (ref 135–145)

## 2024-02-19 LAB — CBG MONITORING, ED
Glucose-Capillary: 111 mg/dL — ABNORMAL HIGH (ref 70–99)
Glucose-Capillary: 115 mg/dL — ABNORMAL HIGH (ref 70–99)

## 2024-02-19 SURGERY — EGD (ESOPHAGOGASTRODUODENOSCOPY)
Anesthesia: General

## 2024-02-19 MED ORDER — SODIUM CHLORIDE 0.9 % IV SOLN: INTRAVENOUS | Status: DC

## 2024-02-19 MED ORDER — PEG 3350-KCL-NA BICARB-NACL 420 G PO SOLR
4000.0000 mL | Freq: Once | ORAL | Status: AC
  Administered 2024-02-19: 4000 mL via ORAL
  Filled 2024-02-19: qty 4000

## 2024-02-19 NOTE — ED Notes (Signed)
Blood sugar was 115 

## 2024-02-19 NOTE — Consult Note (Signed)
 Morgan Copping, MD [facility]  [address] [place], [place] [address] Phone: [phone] Fax : [phone]  Consultation  Referring Provider:     Dr. Sherre Primary Care Physician:  Tower, Laine LABOR, MD Primary Gastroenterologist: Sampson         Reason for Consultation:     GI bleeding  Date of Admission:  02/18/2024 Date of Consultation:  02/19/2024         HPI:   Morgan Hubbard is a 89 y.o. female who reports her last colonoscopy being 15 years ago and has a history of atrial fibrillation on Xarelto with a history of hyperlipidemia hypertension and diabetes.  The patient was admitted to the hospital after presenting to the emergency department due to abnormal labs.  The patient was found to have hemoglobin of 7.4.  Her previous hemoglobin was 13.94 months ago and had been around that area for the previous year.  The patient had a hemoglobin this morning of 8.5. The patient reports that she had black stools about a month ago intermittently but has not had any further black stools and reports that her stools are now brown in color.  She denies any hematemesis nausea or vomiting.  She does report that she had some weight loss but she states that she has gained it back again.  The patient was symptomatic with some worsening shortness of breath for the last few months and reporting that it had been getting worse over the last week.  She also reports that she has been on the Xarelto for many years.  There was reported that the patient's last Xarelto was on 916.  Past Medical History:  Diagnosis Date  . Arthritis    hands (01/13/2014)  . Basal cell carcinoma 01/2014   bridge of nose  . Cardiac LV ejection fraction >40%    it was 43 last year (01/13/2014)  . Chronic lower back pain   . CKD (chronic kidney disease), stage III (HCC)    acute on chronic stage III/notes 06/10/2018  . Colon polyps   . Expressive aphasia    3 times in the last week (01/13/2014)  . GERD  (gastroesophageal reflux disease)   . Goiter past remote   treated with RI  . Hyperlipidemia   . Hyperpotassemia   . Hypertension   . Hypertonicity of bladder   . Inflammatory and toxic neuropathy, unspecified   . LBBB (left bundle branch block)   . Migraine    stopped many years ago (01/13/2014)  . Mini stroke 01/2014  . Other primary cardiomyopathies   . Overactive bladder   . Presence of combination internal cardiac defibrillator (ICD) and pacemaker   . Reflex sympathetic dystrophy, unspecified   . Shingles   . Shortness of breath    ambulation  . Stroke (HCC)   . Thyroid  disease   . Type II diabetes mellitus (HCC)   . Ulcer   . Urine incontinence   . Vertigo    hx of    Past Surgical History:  Procedure Laterality Date  . BACK SURGERY    . BI-VENTRICULAR PACEMAKER INSERTION (CRT-P)  10/2014   [facility]  . BREAST CYST EXCISION Left 1959  . CARDIAC CATHETERIZATION  several   [place]  . CATARACT EXTRACTION W/ INTRAOCULAR LENS IMPLANT Left ~ 2005   several eye injections  . CATARACT EXTRACTION W/PHACO Right 05/04/2020   Procedure: CATARACT EXTRACTION PHACO AND INTRAOCULAR LENS PLACEMENT (IOC) RIGHT DIABETIC;  Surgeon:  Jaye Fallow, MD;  Location: [facility] ORS;  Service: Ophthalmology;  Laterality: Right;  US  00:58.5 CDE 6.70 Fluid Pack Lote # [id number]  . CHOLECYSTECTOMY N/A 06/13/2018   Procedure: LAPAROSCOPIC CHOLECYSTECTOMY;  Surgeon: Belinda Cough, MD;  Location: [facility] OR;  Service: General;  Laterality: N/A;  . COLONOSCOPY  7/12   normal (hx of polyps in past)   . CT SCAN  3/12   outside hosp- lung nodule and gallstones  . ERCP N/A 06/12/2018   Procedure: ENDOSCOPIC RETROGRADE CHOLANGIOPANCREATOGRAPHY (ERCP);  Surgeon: Rollin Dover, MD;  Location: [facility] ENDOSCOPY;  Service: Endoscopy;  Laterality: N/A;  . FINGER SURGERY Left 1959 & 1976   knot on index  . KIDNEY DONATION Left 1989  . LUMBAR LAMINECTOMY/DECOMPRESSION MICRODISCECTOMY  1999  . LUMBAR  LAMINECTOMY/DECOMPRESSION MICRODISCECTOMY  01/31/2012   Procedure: LUMBAR LAMINECTOMY/DECOMPRESSION MICRODISCECTOMY 2 LEVELS;  Surgeon: Alm GORMAN Molt, MD;  Location: [facility] NEURO ORS;  Service: Neurosurgery;  Laterality: N/A;  Thoracic twelve-lumbar one, lumbar one-two laminectomy   . POSTERIOR LAMINECTOMY / DECOMPRESSION CERVICAL SPINE  1999  . REMOVAL OF STONES  06/12/2018   Procedure: REMOVAL OF STONES;  Surgeon: Rollin Dover, MD;  Location: [facility] ENDOSCOPY;  Service: Endoscopy;;  . ANNETT  06/12/2018   Procedure: ANNETT;  Surgeon: Rollin Dover, MD;  Location: [facility] ENDOSCOPY;  Service: Endoscopy;;  . TUBAL LIGATION  1976  . UPPER GASTROINTESTINAL ENDOSCOPY      Prior to Admission medications   Medication Sig Start Date End Date Taking? Authorizing Provider  atorvastatin  (LIPITOR ) 80 MG tablet Take 1 tablet (80 mg total) by mouth daily. 02/18/23  Yes Gollan, Timothy J, MD  BIOTIN 5000 PO Take 5,000 mg by mouth daily.   Yes [provider]  carvedilol  (COREG ) 12.5 MG tablet Take 1 tablet (12.5 mg total) by mouth 2 (two) times daily with a meal. 09/12/23  Yes Gollan, Timothy J, MD  cetirizine (ZYRTEC) 10 MG tablet Take 10 mg by mouth daily.   Yes [provider]  dapagliflozin propanediol (FARXIGA) 5 MG TABS tablet Take 5 mg by mouth daily.   Yes [provider]  famotidine  (PEPCID ) 20 MG tablet Take 1 tablet by mouth once daily 01/01/24  Yes Tower, Laine LABOR, MD  gabapentin  (NEURONTIN ) 300 MG capsule TAKE 1 CAPSULE BY MOUTH THREE TIMES DAILY 04/29/23  Yes Tower, Laine LABOR, MD  insulin  degludec (TRESIBA) 100 UNIT/ML FlexTouch Pen Inject 26 Units into the skin at bedtime. 05/20/22  Yes [provider]  isosorbide  mononitrate (IMDUR ) 30 MG 24 hr tablet Take 1 tablet (30 mg total) by mouth daily. 02/18/24  Yes Furth, Cadence H, PA-C  LORazepam  (ATIVAN ) 0.5 MG tablet Take 1 tablet by mouth every 8 (eight) hours as needed. 03/10/23  Yes [provider]   nystatin cream (MYCOSTATIN) Apply 1 application topically 2 (two) times daily as needed (irritation.).  12/15/19  Yes [provider]  Rivaroxaban (XARELTO) 15 MG TABS tablet Take 15 mg by mouth daily with supper. 12/20/22  Yes [provider]  traMADol  (ULTRAM ) 50 MG tablet Take 1-2 tablets (50-100 mg total) by mouth every 6 (six) hours as needed for severe pain (pain score 7-10). 01/01/24  Yes Tower, Laine LABOR, MD  triamcinolone  cream (KENALOG ) 0.1 % Apply 1 Application topically 2 (two) times daily as needed. 10/08/23  Yes Jimmy Charlie FERNS, MD  vitamin B-12 (CYANOCOBALAMIN ) 1000 MCG tablet Take 500 mcg by mouth daily.   Yes [provider]  VITAMIN D  PO Take by mouth.  Yes [provider]  clotrimazole-betamethasone (LOTRISONE) cream Apply 1 application topically as needed (groin).  Patient not taking: Reported on 02/18/2024 06/19/15   [provider]  Continuous Glucose Sensor (FREESTYLE LIBRE 3 SENSOR) MISC USE TO CHECK GLUCOSE CONTINOUSLY. CHANGE EVERY 14 DAYS 01/20/24   Corwin Antu, FNP  hydrocortisone  2.5 % cream Apply topically 3 (three) times daily as needed. Patient not taking: Reported on 02/18/2024 02/17/23   Jimmy Charlie FERNS, MD  pantoprazole  (PROTONIX ) 40 MG tablet Take 1 tablet by mouth once daily 09/19/21   Tower, Laine LABOR, MD  Respiratory Therapy Supplies (NEBULIZER/TUBING/MOUTHPIECE) KIT 1 each by Does not apply route 3 (three) times daily as needed. 07/30/22   Dugal, Tabitha, FNP  Semaglutide, 2 MG/DOSE, (OZEMPIC, 2 MG/DOSE,) 8 MG/3ML SOPN Inject 2 mg into the skin once a week. Patient not taking: Reported on 02/18/2024    Damian Therisa HERO, MD    Family History  Problem Relation Age of Onset  . Arthritis Mother   . Cancer Mother        uterine and mouth  . Hyperlipidemia Mother   . Stroke Mother   . Hypertension Mother   . Alcohol abuse Father   . Diabetes Father   . Cancer Sister        breast  . Diabetes Sister   . Hyperlipidemia  Sister   . Heart disease Sister   . Hypertension Sister   . Diabetes Sister   . Cancer Brother        lung cancer  . Kidney disease Brother   . Diabetes Brother   . Hyperlipidemia Brother   . Kidney failure Brother   . Diabetes Daughter   . Heart attack Daughter   . Addison's disease Other      Social History   Tobacco Use  . Smoking status: Never  . Smokeless tobacco: Never  Vaping Use  . Vaping status: Never Used  Substance Use Topics  . Alcohol use: No    Alcohol/week: 0.0 standard drinks of alcohol  . Drug use: No    Allergies as of 02/18/2024 - Review Complete 02/18/2024  Allergen Reaction Noted  . Ace inhibitors Swelling 07/09/2010  . Lisinopril Swelling 08/28/2010  . Insulin  detemir Itching 02/09/2016  . Nsaids Other (See Comments) 09/03/2012  . Codeine Nausea Only 07/09/2010  . Hydrochlorothiazide Other (See Comments) 10/30/2011  . Tylenol  [acetaminophen ] Other (See Comments) 09/03/2012    Review of Systems:    All systems reviewed and negative except where noted in HPI.   Physical Exam:  Vital signs in last 24 hours: Temp:  [98.1 F (36.7 C)-98.4 F (36.9 C)] 98.3 F (36.8 C) (09/18 0721) Pulse Rate:  [64-82] 67 (09/18 0900) Resp:  [15-27] 19 (09/18 0900) BP: (96-191)/(42-84) 149/59 (09/18 0900) SpO2:  [92 %-100 %] 100 % (09/18 0900) Weight:  [66.2 kg] 66.2 kg (09/17 1634)   General:   Pleasant, cooperative in NAD Head:  Normocephalic and atraumatic. Eyes:   No icterus.   Conjunctiva pink. PERRLA. Ears:  Normal auditory acuity. Neck:  Supple; no masses or thyroidomegaly Lungs: Respirations even and unlabored. Lungs clear to auscultation bilaterally.   No wheezes, crackles, or rhonchi.  Heart:  Regular rate and rhythm;  Without murmur, clicks, rubs or gallops Abdomen:  Soft, nondistended, nontender. Normal bowel sounds. No appreciable masses or hepatomegaly.  No rebound or guarding.  Rectal:  Not performed. Msk:  Symmetrical without gross  deformities.    Extremities:  Without edema, cyanosis or  clubbing. Neurologic:  Alert and oriented x3;  grossly normal neurologically. Skin:  Intact without significant lesions or rashes. Cervical Nodes:  No significant cervical adenopathy. Psych:  Alert and cooperative. Normal affect.  LAB RESULTS: Recent Labs    02/17/24 1153 02/18/24 1638 02/19/24 0439  WBC 6.5 6.2 7.3  HGB 7.2* 7.4* 8.5*  HCT 24.5* 23.3* 26.7*  PLT 195 221 212   BMET Recent Labs    02/17/24 1153 02/18/24 1638 02/19/24 0439  NA 133* 135 141  K 5.2 5.3* 3.8  CL 100 105 110  CO2 20 23 25   GLUCOSE 497* 413* 111*  BUN 21 21 19   CREATININE 1.49* 1.55* 1.47*  CALCIUM  8.1* 8.3* 8.3*   LFT Recent Labs    02/18/24 1638  PROT 5.8*  ALBUMIN 3.1*  AST 18  ALT 13  ALKPHOS 81  BILITOT 0.6   PT/INR Recent Labs    02/18/24 1720  LABPROT 20.8*  INR 1.7*    STUDIES: No results found.    Impression / Plan:   Assessment: Principal Problem:   Upper GI bleed Active Problems:   Type II diabetes mellitus with renal manifestations (HCC)   Hyperlipidemia associated with type 2 diabetes mellitus (HCC)   Hyperkalemia   Neuropathy   Essential hypertension   GERD (gastroesophageal reflux disease)   Symptomatic anemia   CKD stage 3b, GFR 30-44 ml/min (HCC)   Morgan Hubbard is a 89 y.o. y/o female with who comes in with symptomatic anemia and abnormal labs as an outpatient therefore came to the ED and was found to have a significant drop in her hemoglobin from her baseline.  The patient had black stools but that was approximately 1 month ago but has not had any further black stools.  Her last colonoscopy was 15 years ago.  The patient was recommended to undergo an upper endoscopy today and if negative a colonoscopy to follow the following day but the patient has refused the EGD and wants to do both procedures at the same time due to her fear of sedation.  Plan:  The patient will be set up for an EGD and  colonoscopy for tomorrow.  It appears that the patient has been off of Xarelto since the 16th.  The patient will be given a prep today.  Risks and benefits and alternatives have been explained to the patient and she agrees to proceed with the procedures.  Thank you for involving me in the care of this patient.      LOS: 1 day   Morgan Copping, MD, MD. NOLIA 02/19/2024, 9:54 AM,  Pager [phone] 7am-5pm  Check AMION for 5pm -7am coverage and on weekends   Note: This dictation was prepared with Dragon dictation along with smaller phrase technology. Any transcriptional errors that result from this process are unintentional.

## 2024-02-19 NOTE — Progress Notes (Signed)
 [facility]  - [place] at [facility]   PATIENT NAME: Morgan Hubbard    MR#:  [id number]  DATE OF BIRTH:  [birth year]  SUBJECTIVE:  husband at bedside. Patient came in with abnormal labs with hemoglobin of 7.2 and dark-colored stools along with generalized weakness patient got one unit blood transfusion overall feeling better. No vomiting.    VITALS:  Blood pressure (!) 133/46, pulse 64, temperature 97.9 F (36.6 C), temperature source Oral, resp. rate 15, height 5' 1 (1.549 m), weight 66.2 kg, last menstrual period 06/03/1992, SpO2 95%.  PHYSICAL EXAMINATION:   GENERAL:  89 y.o.-year-old patient with no acute distress.  LUNGS: Normal breath sounds bilaterally CARDIOVASCULAR: S1, S2 normal. No murmur   ABDOMEN: Soft, nontender, nondistended.  EXTREMITIES: No  edema b/l.    NEUROLOGIC: nonfocal  patient is alert and awake  LABORATORY PANEL:  CBC Recent Labs  Lab 02/19/24 0439  WBC 7.3  HGB 8.5*  HCT 26.7*  PLT 212    Chemistries  Recent Labs  Lab 02/18/24 1638 02/19/24 0439  NA 135 141  K 5.3* 3.8  CL 105 110  CO2 23 25  GLUCOSE 413* 111*  BUN 21 19  CREATININE 1.55* 1.47*  CALCIUM  8.3* 8.3*  AST 18  --   ALT 13  --   ALKPHOS 81  --   BILITOT 0.6  --     Assessment and Plan Morgan Hubbard is a 89 year old female with history of atrial fibrillation on Xarelto, hypertension, hyperlipidemia, insulin -dependent diabetes mellitus, who presents emergency department for chief concerns of abnormal labs outpatient.  Patient reports that for the past month she has been dealing with generalized weakness and fatigue as well as dyspnea on exertion. She has noticed dark stools intermittently during this time, but has not noticed any blood in her stool and has not had any vomiting.  She denies any history of GI bleeding, states her last colonoscopy was many years ago.   Her Hgb was 7.2 (13.4--several months ago)  GI bleed/Melena in the setting of chronic  anticoagulation Symptomatic anemia --Suspect secondary to Xarelto use in setting of atrial fibrillation --Will hold Xarelto on admission --came in with hgb 7.2--s/p 1 unit PRBC-- 8.5 --Continue Protonix  40 mg IV twice daily --Gastroenterology consult with Dr wohl--EGD/colonoscopy tomorrow   Hyperkalemia --resolved   Neuropathy  --cont home gabapentin     Type II diabetes mellitus with renal manifestations (HCC) Insulin -dependent diabetes mellitus --Patient is on insulin  degludec 26 units nightly which is held on admission due to patient being clear liquid diet and n.p.o. after midnight --Insulin  SSI with at bedtime coverage ordered   CKD stage 3b, GFR 30-44 ml/min (HCC) --At baseline   GERD (gastroesophageal reflux disease) --PPI twice daily   Essential hypertension --cont coreg  and Imdur    Hyperlipidemia associated with type 2 diabetes mellitus (HCC) --Home atorvastatin     Procedures: Family communication :Husband at bedside Consults :GI CODE STATUS: full DVT Prophylaxis :SCD (hold xarelto) Level of care: Progressive Status is: Inpatient Remains inpatient appropriate because: GI bleed w/u    TOTAL TIME TAKING CARE OF THIS PATIENT: 40 minutes.  >50% time spent on counselling and coordination of care  Note: This dictation was prepared with Dragon dictation along with smaller phrase technology. Any transcriptional errors that result from this process are unintentional.  Leita Blanch M.D    [facility]   CC: Primary care physician; Tower, Laine LABOR, MD

## 2024-02-19 NOTE — Plan of Care (Signed)
   Problem: Coping: Goal: Ability to adjust to condition or change in health will improve Outcome: Progressing   Problem: Fluid Volume: Goal: Ability to maintain a balanced intake and output will improve Outcome: Progressing

## 2024-02-19 NOTE — Inpatient Diabetes Management (Signed)
 Inpatient Diabetes Program Recommendations  AACE/ADA: New Consensus Statement on Inpatient Glycemic Control (2015)  Target Ranges:  Prepandial:   less than 140 mg/dL      Peak postprandial:   less than 180 mg/dL (1-2 hours)      Critically ill patients:  140 - 180 mg/dL    Latest Reference Range & Units 02/18/24 21:03 02/19/24 08:12  Glucose-Capillary 70 - 99 mg/dL 686 (H)  4 units Novolog   10 units Lantus  111 (H)  (H): Data is abnormally high     To ED with Upper GIB/ Symptomatic anemia/ Hyperkalemia  Home DM Meds: Tresiba 26 units at HS     Farxiga 5 mg daily      FSL3 CGM  Current Orders: Novolog  0-9 units TID ac/hs    CBG was 313 last night at bedtime Was given 4 units Novolog  + 10 units Lantus  No Lantus  ordered for today   MD- Note pt received 10 units Lantus  last PM X 1 dose CBG 111 this AM No Lantus  ordered for today  Please consider ordering Lantus  10 units at bedtime     --Will follow patient during hospitalization--  Adina Rudolpho Arrow RN, MSN, CDCES Diabetes Coordinator Inpatient Glycemic Control Team Team Pager: [phone] (8a-5p)

## 2024-02-20 ENCOUNTER — Encounter
Admission: EM | Disposition: A | Discharge status: Home / Self Care | Payer: Self-pay | Source: Home / Self Care | Attending: Internal Medicine

## 2024-02-20 ENCOUNTER — Inpatient Hospital Stay: Admitting: Anesthesiology

## 2024-02-20 ENCOUNTER — Encounter: Payer: Self-pay | Admitting: Internal Medicine

## 2024-02-20 ENCOUNTER — Other Ambulatory Visit: Payer: Self-pay

## 2024-02-20 DIAGNOSIS — D124 Benign neoplasm of descending colon: Secondary | ICD-10-CM

## 2024-02-20 DIAGNOSIS — E1169 Type 2 diabetes mellitus with other specified complication: Secondary | ICD-10-CM

## 2024-02-20 DIAGNOSIS — K449 Diaphragmatic hernia without obstruction or gangrene: Secondary | ICD-10-CM | POA: Diagnosis not present

## 2024-02-20 DIAGNOSIS — K640 First degree hemorrhoids: Secondary | ICD-10-CM | POA: Diagnosis not present

## 2024-02-20 DIAGNOSIS — D649 Anemia, unspecified: Secondary | ICD-10-CM

## 2024-02-20 DIAGNOSIS — K5520 Angiodysplasia of colon without hemorrhage: Secondary | ICD-10-CM

## 2024-02-20 DIAGNOSIS — K635 Polyp of colon: Secondary | ICD-10-CM | POA: Diagnosis not present

## 2024-02-20 DIAGNOSIS — K5730 Diverticulosis of large intestine without perforation or abscess without bleeding: Secondary | ICD-10-CM

## 2024-02-20 DIAGNOSIS — E785 Hyperlipidemia, unspecified: Secondary | ICD-10-CM

## 2024-02-20 DIAGNOSIS — D509 Iron deficiency anemia, unspecified: Secondary | ICD-10-CM | POA: Diagnosis not present

## 2024-02-20 DIAGNOSIS — K922 Gastrointestinal hemorrhage, unspecified: Secondary | ICD-10-CM | POA: Diagnosis not present

## 2024-02-20 HISTORY — PX: ESOPHAGOGASTRODUODENOSCOPY: SHX5428

## 2024-02-20 HISTORY — PX: COLONOSCOPY: SHX5424

## 2024-02-20 LAB — HEMOGLOBIN: Hemoglobin: 8.6 g/dL — ABNORMAL LOW (ref 12.0–15.0)

## 2024-02-20 LAB — GLUCOSE, CAPILLARY
Glucose-Capillary: 144 mg/dL — ABNORMAL HIGH (ref 70–99)
Glucose-Capillary: 143 mg/dL — ABNORMAL HIGH (ref 70–99)
Glucose-Capillary: 154 mg/dL — ABNORMAL HIGH (ref 70–99)
Glucose-Capillary: 377 mg/dL — ABNORMAL HIGH (ref 70–99)
Glucose-Capillary: 359 mg/dL — ABNORMAL HIGH (ref 70–99)

## 2024-02-20 SURGERY — EGD (ESOPHAGOGASTRODUODENOSCOPY)
Anesthesia: General

## 2024-02-20 MED ORDER — LIDOCAINE HCL (CARDIAC) PF 100 MG/5ML IV SOSY
PREFILLED_SYRINGE | INTRAVENOUS | Status: DC | PRN
  Administered 2024-02-20: 60 mg via INTRAVENOUS

## 2024-02-20 MED ORDER — PROPOFOL 500 MG/50ML IV EMUL
INTRAVENOUS | Status: DC | PRN
  Administered 2024-02-20: 50 ug/kg/min via INTRAVENOUS

## 2024-02-20 MED ORDER — PROPOFOL 1000 MG/100ML IV EMUL
INTRAVENOUS | Status: AC
  Filled 2024-02-20: qty 100

## 2024-02-20 MED ORDER — PROPOFOL 10 MG/ML IV BOLUS
INTRAVENOUS | Status: DC | PRN
  Administered 2024-02-20 (×3): 20 mg via INTRAVENOUS

## 2024-02-20 MED ORDER — LIDOCAINE HCL (PF) 2 % IJ SOLN
INTRAMUSCULAR | Status: AC
Start: 2024-02-20 — End: 2024-02-20
  Filled 2024-02-20: qty 5

## 2024-02-20 NOTE — TOC CM/SW Note (Signed)
 Transition of Care [facility]) - Inpatient Brief Assessment   Patient Details  Name: Morgan Hubbard MRN: [id number] Date of Birth: [birth year]  [facility] of [facility]) CM/SW Contact:    Lauraine JAYSON Carpen, LCSW Phone Number: 02/20/2024, 12:42 PM   Clinical Narrative: CSW reviewed chart. No TOC needs identified so far. CSW will continue to follow progress. Please place [facility] consult if any needs arise.  Transition of Care Asessment: Insurance and Status: Insurance coverage has been reviewed Patient has primary care physician: Yes Home environment has been reviewed: Home Prior level of function:: Not documented Prior/Current Home Services: No current home services Social Drivers of Health Review: SDOH reviewed no interventions necessary Readmission risk has been reviewed: Yes Transition of care needs: no transition of care needs at this time

## 2024-02-20 NOTE — Anesthesia Preprocedure Evaluation (Signed)
 Anesthesia Evaluation  Patient identified by MRN, date of birth, ID band Patient awake    Reviewed: Allergy & Precautions, NPO status , Patient's Chart, lab work & pertinent test results  Airway Mallampati: III  TM Distance: <3 FB Neck ROM: full    Dental  (+) Chipped   Pulmonary neg COPD   Pulmonary exam normal        Cardiovascular Exercise Tolerance: Good hypertension, + Peripheral Vascular Disease and +CHF  Normal cardiovascular exam+ dysrhythmias + Cardiac Defibrillator      Neuro/Psych  Headaches  Neuromuscular disease CVA  negative psych ROS   GI/Hepatic negative GI ROS, Neg liver ROS,,,  Endo/Other  diabetes, Type 2    Renal/GU Renal disease  negative genitourinary   Musculoskeletal   Abdominal   Peds  Hematology negative hematology ROS (+)   Anesthesia Other Findings Past Medical History: No date: Arthritis     Comment:  hands (01/13/2014) 01/2014: Basal cell carcinoma     Comment:  bridge of nose No date: Cardiac LV ejection fraction >40%     Comment:  it was 43 last year (01/13/2014) No date: Chronic lower back pain No date: CKD (chronic kidney disease), stage III (HCC)     Comment:  acute on chronic stage III/notes 06/10/2018 No date: Colon polyps No date: Expressive aphasia     Comment:  3 times in the last week (01/13/2014) No date: GERD (gastroesophageal reflux disease) past remote: Goiter     Comment:  treated with RI No date: Hyperlipidemia No date: Hyperpotassemia No date: Hypertension No date: Hypertonicity of bladder No date: Inflammatory and toxic neuropathy, unspecified No date: LBBB (left bundle branch block) No date: Migraine     Comment:  stopped many years ago (01/13/2014) 01/2014: Mini stroke No date: Other primary cardiomyopathies No date: Overactive bladder No date: Presence of combination internal cardiac defibrillator (ICD)  and pacemaker No date: Reflex sympathetic  dystrophy, unspecified No date: Shingles No date: Shortness of breath     Comment:  ambulation No date: Stroke (HCC) No date: Thyroid  disease No date: Type II diabetes mellitus (HCC) No date: Ulcer No date: Urine incontinence No date: Vertigo     Comment:  hx of  Past Surgical History: No date: BACK SURGERY 10/2014: BI-VENTRICULAR PACEMAKER INSERTION (CRT-P)     Comment:  [facility] 1959: BREAST CYST EXCISION; Left several: CARDIAC CATHETERIZATION     Comment:  [place] ~ 2005: CATARACT EXTRACTION W/ INTRAOCULAR LENS IMPLANT; Left     Comment:  several eye injections 05/04/2020: CATARACT EXTRACTION W/PHACO; Right     Comment:  Procedure: CATARACT EXTRACTION PHACO AND INTRAOCULAR               LENS PLACEMENT (IOC) RIGHT DIABETIC;  Surgeon: Jaye Fallow, MD;  Location: [facility] ORS;  Service:               Ophthalmology;  Laterality: Right;  US  00:58.5 CDE               6.70 Fluid Pack Lote # [id number] 06/13/2018: CHOLECYSTECTOMY; N/A     Comment:  Procedure: LAPAROSCOPIC CHOLECYSTECTOMY;  Surgeon:               Belinda Cough, MD;  Location: [facility] OR;  Service: General;               Laterality: N/A; 7/12: COLONOSCOPY     Comment:  normal (hx  of polyps in past)  3/12: CT SCAN     Comment:  outside hosp- lung nodule and gallstones 06/12/2018: ERCP; N/A     Comment:  Procedure: ENDOSCOPIC RETROGRADE               CHOLANGIOPANCREATOGRAPHY (ERCP);  Surgeon: Rollin Dover,              MD;  Location: [facility] ENDOSCOPY;  Service: Endoscopy;                Laterality: N/A; 1959 & 1976: FINGER SURGERY; Left     Comment:  knot on index 1989: KIDNEY DONATION; Left 1999: LUMBAR LAMINECTOMY/DECOMPRESSION MICRODISCECTOMY 01/31/2012: LUMBAR LAMINECTOMY/DECOMPRESSION MICRODISCECTOMY     Comment:  Procedure: LUMBAR LAMINECTOMY/DECOMPRESSION               MICRODISCECTOMY 2 LEVELS;  Surgeon: Alm GORMAN Molt, MD;                Location: [facility] NEURO ORS;  Service: Neurosurgery;                 Laterality: N/A;  Thoracic twelve-lumbar one, lumbar               one-two laminectomy 1999: POSTERIOR LAMINECTOMY / DECOMPRESSION CERVICAL SPINE 06/12/2018: REMOVAL OF STONES     Comment:  Procedure: REMOVAL OF STONES;  Surgeon: Rollin Dover,               MD;  Location: [facility] ENDOSCOPY;  Service: Endoscopy;; 06/12/2018: SPHINCTEROTOMY     Comment:  Procedure: SPHINCTEROTOMY;  Surgeon: Rollin Dover, MD;               Location: [facility] ENDOSCOPY;  Service: Endoscopy;; 1976: TUBAL LIGATION No date: UPPER GASTROINTESTINAL ENDOSCOPY  BMI    Body Mass Index: 27.59 kg/m      Reproductive/Obstetrics negative OB ROS                              Anesthesia Physical Anesthesia Plan  ASA: 3  Anesthesia Plan: General   Post-op Pain Management:    Induction: Intravenous  PONV Risk Score and Plan: Propofol  infusion and TIVA  Airway Management Planned: Natural Airway and Nasal Cannula  Additional Equipment:   Intra-op Plan:   Post-operative Plan:   Informed Consent: I have reviewed the patients History and Physical, chart, labs and discussed the procedure including the risks, benefits and alternatives for the proposed anesthesia with the patient or authorized representative who has indicated his/her understanding and acceptance.     Dental Advisory Given  Plan Discussed with: Anesthesiologist, CRNA and Surgeon  Anesthesia Plan Comments: (Patient consented for risks of anesthesia including but not limited to:  - adverse reactions to medications - risk of airway placement if required - damage to eyes, teeth, lips or other oral mucosa - nerve damage due to positioning  - sore throat or hoarseness - Damage to heart, brain, nerves, lungs, other parts of body or loss of life  Patient voiced understanding and assent.)        Anesthesia Quick Evaluation

## 2024-02-20 NOTE — Progress Notes (Signed)
 [facility]  - [place] [facility]   PATIENT NAME: Morgan Hubbard    MR#:  [id number]  DATE OF BIRTH:  Oct 16, 1934  SUBJECTIVE:  Son at bedside. Patient came in with abnormal labs with hemoglobin of 7.2 and dark-colored stools along with generalized weakness Post luminal evaluation  VITALS:  Blood pressure (!) 158/54, pulse 64, temperature 98.1 F (36.7 C), resp. rate 14, height 5' 1 (1.549 m), weight 66.2 kg, last menstrual period 06/03/1992, SpO2 96%.  PHYSICAL EXAMINATION:   GENERAL:  89 y.o.-year-old patient with no acute distress.  LUNGS: Normal breath sounds bilaterally CARDIOVASCULAR: S1, S2 normal. No murmur   ABDOMEN: Soft, nontender, nondistended.  EXTREMITIES: No  edema b/l.    NEUROLOGIC: nonfocal  patient is alert and awake  LABORATORY PANEL:  CBC Recent Labs  Lab 02/19/24 0439  WBC 7.3  HGB 8.5*  HCT 26.7*  PLT 212    Chemistries  Recent Labs  Lab 02/18/24 1638 02/19/24 0439  NA 135 141  K 5.3* 3.8  CL 105 110  CO2 23 25  GLUCOSE 413* 111*  BUN 21 19  CREATININE 1.55* 1.47*  CALCIUM  8.3* 8.3*  AST 18  --   ALT 13  --   ALKPHOS 81  --   BILITOT 0.6  --     Assessment and Plan Morgan Hubbard is a 89 year old female with history of atrial fibrillation on Xarelto, hypertension, hyperlipidemia, insulin -dependent diabetes mellitus, who presents emergency department for chief concerns of abnormal labs outpatient.  Patient reports that for the past month she has been dealing with generalized weakness and fatigue as well as dyspnea on exertion. She has noticed dark stools intermittently during this time, but has not noticed any blood in her stool and has not had any vomiting.  She denies any history of GI bleeding, states her last colonoscopy was many years ago.   Her Hgb was 7.2 (13.4--several months ago)  GI bleed/Melena in the setting of chronic anticoagulation Symptomatic anemia --Suspect secondary to Xarelto use in setting of atrial  fibrillation --Will hold Xarelto on admission --came in with hgb 7.2--s/p 1 unit PRBC-- 8.5 --Continue Protonix  40 mg IV twice daily --Gastroenterology consult with Dr wohl--EGD/colonoscopy  --EGD  - Small hiatal hernia.                        - Normal stomach.                        - Normal examined duodenum.                        - No specimens collected.  --Colonoscopy  - One 4 mm polyp in the descending colon, removed with                         a cold snare. Resected and retrieved.                        - A single non-bleeding colonic angiodysplastic lesion. Treated with argon plasma coagulation (APC).                        - Diverticulosis in the sigmoid colon.                        -  Non-bleeding internal hemorrhoids. --pt getting Capsule study today  Hyperkalemia --resolved   Neuropathy  --cont home gabapentin     Type II diabetes mellitus with renal manifestations (HCC) Insulin -dependent diabetes mellitus --Patient is on insulin  degludec 26 units nightly which is held on admission due to patient being clear liquid diet and n.p.o. after midnight --Insulin  SSI with at bedtime coverage ordered   CKD stage 3b, GFR 30-44 ml/min (HCC) --At baseline   GERD (gastroesophageal reflux disease) --PPI twice daily   Essential hypertension --cont coreg  and Imdur    Hyperlipidemia associated with type 2 diabetes mellitus (HCC) --Home atorvastatin     Procedures: EGD, colonoscopy, capsule study Family communication :son at bedside Consults :GI CODE STATUS: full DVT Prophylaxis :SCD (hold xarelto) Level of care: Telemetry Medical Status is: Inpatient Remains inpatient appropriate because: GI bleed w/u    TOTAL TIME TAKING CARE OF THIS PATIENT: 40 minutes.  >50% time spent on counselling and coordination of care  Note: This dictation was prepared with Dragon dictation along with smaller phrase technology. Any transcriptional errors that result from this process are  unintentional.  Leita Blanch M.D    [facility]   CC: Primary care physician; Tower, Laine LABOR, MD

## 2024-02-20 NOTE — Progress Notes (Signed)
 The patient had EGD and colonoscopy without any frank blood or source of bleeding seen.  The patient had a colonic polyp and 1 AVM.  The polyp was removed and the AVM was cauterized.  The patient will have a capsule endoscopy that will be read by Dr. Onita tomorrow if downloaded and ready at that time.

## 2024-02-20 NOTE — Plan of Care (Signed)
   Problem: Education: Goal: Ability to describe self-care measures that may prevent or decrease complications (Diabetes Survival Skills Education) will improve Outcome: Progressing Goal: Individualized Educational Video(s) Outcome: Progressing   Problem: Coping: Goal: Ability to adjust to condition or change in health will improve Outcome: Progressing   Problem: Fluid Volume: Goal: Ability to maintain a balanced intake and output will improve Outcome: Progressing   Problem: Health Behavior/Discharge Planning: Goal: Ability to identify and utilize available resources and services will improve Outcome: Progressing Goal: Ability to manage health-related needs will improve Outcome: Progressing   Problem: Metabolic: Goal: Ability to maintain appropriate glucose levels will improve Outcome: Progressing   Problem: Nutritional: Goal: Maintenance of adequate nutrition will improve Outcome: Progressing Goal: Progress toward achieving an optimal weight will improve Outcome: Progressing   Problem: Skin Integrity: Goal: Risk for impaired skin integrity will decrease Outcome: Progressing   Problem: Tissue Perfusion: Goal: Adequacy of tissue perfusion will improve Outcome: Progressing   Problem: Education: Goal: Knowledge of General Education information will improve Description: Including pain rating scale, medication(s)/side effects and non-pharmacologic comfort measures Outcome: Progressing   Problem: Health Behavior/Discharge Planning: Goal: Ability to manage health-related needs will improve Outcome: Progressing   Problem: Clinical Measurements: Goal: Ability to maintain clinical measurements within normal limits will improve Outcome: Progressing Goal: Will remain free from infection Outcome: Progressing Goal: Diagnostic test results will improve Outcome: Progressing Goal: Respiratory complications will improve Outcome: Progressing Goal: Cardiovascular complication will  be avoided Outcome: Progressing   Problem: Activity: Goal: Risk for activity intolerance will decrease Outcome: Progressing   Problem: Nutrition: Goal: Adequate nutrition will be maintained Outcome: Progressing   Problem: Coping: Goal: Level of anxiety will decrease Outcome: Progressing   Problem: Elimination: Goal: Will not experience complications related to bowel motility Outcome: Progressing Goal: Will not experience complications related to urinary retention Outcome: Progressing   Problem: Pain Managment: Goal: General experience of comfort will improve and/or be controlled Outcome: Progressing   Problem: Safety: Goal: Ability to remain free from injury will improve Outcome: Progressing   Problem: Skin Integrity: Goal: Risk for impaired skin integrity will decrease Outcome: Progressing   Problem: Education: Goal: Ability to identify signs and symptoms of gastrointestinal bleeding will improve Outcome: Progressing   Problem: Bowel/Gastric: Goal: Will show no signs and symptoms of gastrointestinal bleeding Outcome: Progressing   Problem: Fluid Volume: Goal: Will show no signs and symptoms of excessive bleeding Outcome: Progressing   Problem: Clinical Measurements: Goal: Complications related to the disease process, condition or treatment will be avoided or minimized Outcome: Progressing

## 2024-02-20 NOTE — Anesthesia Postprocedure Evaluation (Signed)
 Anesthesia Post Note  Patient: Morgan Hubbard  Procedure(s) Performed: EGD (ESOPHAGOGASTRODUODENOSCOPY) COLONOSCOPY  Patient location during evaluation: Endoscopy Anesthesia Type: General Level of consciousness: awake and alert Pain management: pain level controlled Vital Signs Assessment: post-procedure vital signs reviewed and stable Respiratory status: spontaneous breathing, nonlabored ventilation and respiratory function stable Cardiovascular status: blood pressure returned to baseline and stable Postop Assessment: no apparent nausea or vomiting Anesthetic complications: no   No notable events documented.   Last Vitals:  Vitals:   02/20/24 0814 02/20/24 0824  BP: (!) 139/47 (!) 138/50  Pulse: 67 67  Resp: (!) 21 (!) 24  Temp:  (!) 36.3 C  SpO2: 97% 96%    Last Pain:  Vitals:   02/20/24 0824  TempSrc: Temporal  PainSc: (P) 0-No pain                 Fairy POUR Kerigan Narvaez

## 2024-02-20 NOTE — Care Management Important Message (Signed)
 Important Message  Patient Details  Name: Morgan Hubbard MRN: [id number] Date of Birth: 1935/01/29   Important Message Given:  Yes - Medicare IM     [name] 02/20/2024, 4:47 PM

## 2024-02-20 NOTE — Plan of Care (Signed)
 Problem: Education: Goal: Ability to describe self-care measures that may prevent or decrease complications (Diabetes Survival Skills Education) will improve 02/20/2024 1831 by Dasie Sharlet RAMAN, RN Outcome: Progressing 02/20/2024 1828 by Dasie Sharlet RAMAN, RN Outcome: Progressing Goal: Individualized Educational Video(s) 02/20/2024 1831 by Dasie Sharlet RAMAN, RN Outcome: Progressing 02/20/2024 1828 by Dasie Sharlet RAMAN, RN Outcome: Progressing   Problem: Coping: Goal: Ability to adjust to condition or change in health will improve 02/20/2024 1831 by Dasie Sharlet RAMAN, RN Outcome: Progressing 02/20/2024 1828 by Dasie Sharlet RAMAN, RN Outcome: Progressing   Problem: Fluid Volume: Goal: Ability to maintain a balanced intake and output will improve 02/20/2024 1831 by Dasie Sharlet RAMAN, RN Outcome: Progressing 02/20/2024 1828 by Dasie Sharlet RAMAN, RN Outcome: Progressing   Problem: Health Behavior/Discharge Planning: Goal: Ability to identify and utilize available resources and services will improve 02/20/2024 1831 by Dasie Sharlet RAMAN, RN Outcome: Progressing 02/20/2024 1828 by Dasie Sharlet RAMAN, RN Outcome: Progressing Goal: Ability to manage health-related needs will improve 02/20/2024 1831 by Dasie Sharlet RAMAN, RN Outcome: Progressing 02/20/2024 1828 by Dasie Sharlet RAMAN, RN Outcome: Progressing   Problem: Metabolic: Goal: Ability to maintain appropriate glucose levels will improve 02/20/2024 1831 by Dasie Sharlet RAMAN, RN Outcome: Progressing 02/20/2024 1828 by Dasie Sharlet RAMAN, RN Outcome: Progressing   Problem: Nutritional: Goal: Maintenance of adequate nutrition will improve 02/20/2024 1831 by Dasie Sharlet RAMAN, RN Outcome: Progressing 02/20/2024 1828 by Dasie Sharlet RAMAN, RN Outcome: Progressing Goal: Progress toward achieving an optimal weight will improve 02/20/2024 1831 by Dasie Sharlet RAMAN, RN Outcome: Progressing 02/20/2024 1828 by Dasie Sharlet RAMAN, RN Outcome: Progressing   Problem: Skin  Integrity: Goal: Risk for impaired skin integrity will decrease 02/20/2024 1831 by Dasie Sharlet RAMAN, RN Outcome: Progressing 02/20/2024 1828 by Dasie Sharlet RAMAN, RN Outcome: Progressing   Problem: Tissue Perfusion: Goal: Adequacy of tissue perfusion will improve 02/20/2024 1831 by Dasie Sharlet RAMAN, RN Outcome: Progressing 02/20/2024 1828 by Dasie Sharlet RAMAN, RN Outcome: Progressing   Problem: Education: Goal: Knowledge of General Education information will improve Description: Including pain rating scale, medication(s)/side effects and non-pharmacologic comfort measures 02/20/2024 1831 by Dasie Sharlet RAMAN, RN Outcome: Progressing 02/20/2024 1828 by Dasie Sharlet RAMAN, RN Outcome: Progressing   Problem: Health Behavior/Discharge Planning: Goal: Ability to manage health-related needs will improve 02/20/2024 1831 by Dasie Sharlet RAMAN, RN Outcome: Progressing 02/20/2024 1828 by Dasie Sharlet RAMAN, RN Outcome: Progressing   Problem: Clinical Measurements: Goal: Ability to maintain clinical measurements within normal limits will improve 02/20/2024 1831 by Dasie Sharlet RAMAN, RN Outcome: Progressing 02/20/2024 1828 by Dasie Sharlet RAMAN, RN Outcome: Progressing Goal: Will remain free from infection 02/20/2024 1831 by Dasie Sharlet RAMAN, RN Outcome: Progressing 02/20/2024 1828 by Dasie Sharlet RAMAN, RN Outcome: Progressing Goal: Diagnostic test results will improve 02/20/2024 1831 by Dasie Sharlet RAMAN, RN Outcome: Progressing 02/20/2024 1828 by Dasie Sharlet RAMAN, RN Outcome: Progressing Goal: Respiratory complications will improve 02/20/2024 1831 by Dasie Sharlet RAMAN, RN Outcome: Progressing 02/20/2024 1828 by Dasie Sharlet RAMAN, RN Outcome: Progressing Goal: Cardiovascular complication will be avoided 02/20/2024 1831 by Dasie Sharlet RAMAN, RN Outcome: Progressing 02/20/2024 1828 by Dasie Sharlet RAMAN, RN Outcome: Progressing   Problem: Activity: Goal: Risk for activity intolerance will decrease 02/20/2024 1831 by Dasie Sharlet RAMAN, RN Outcome: Progressing 02/20/2024 1828 by Dasie Sharlet RAMAN, RN Outcome: Progressing   Problem: Nutrition: Goal: Adequate nutrition will be maintained 02/20/2024 1831 by Dasie Sharlet RAMAN, RN Outcome: Progressing 02/20/2024 1828 by Dasie Sharlet RAMAN, RN Outcome: Progressing  Problem: Coping: Goal: Level of anxiety will decrease 02/20/2024 1831 by Dasie Sharlet RAMAN, RN Outcome: Progressing 02/20/2024 1828 by Dasie Sharlet RAMAN, RN Outcome: Progressing   Problem: Elimination: Goal: Will not experience complications related to bowel motility 02/20/2024 1831 by Dasie Sharlet RAMAN, RN Outcome: Progressing 02/20/2024 1828 by Dasie Sharlet RAMAN, RN Outcome: Progressing Goal: Will not experience complications related to urinary retention 02/20/2024 1831 by Dasie Sharlet RAMAN, RN Outcome: Progressing 02/20/2024 1828 by Dasie Sharlet RAMAN, RN Outcome: Progressing   Problem: Pain Managment: Goal: General experience of comfort will improve and/or be controlled 02/20/2024 1831 by Dasie Sharlet RAMAN, RN Outcome: Progressing 02/20/2024 1828 by Dasie Sharlet RAMAN, RN Outcome: Progressing   Problem: Safety: Goal: Ability to remain free from injury will improve 02/20/2024 1831 by Dasie Sharlet RAMAN, RN Outcome: Progressing 02/20/2024 1828 by Dasie Sharlet RAMAN, RN Outcome: Progressing   Problem: Skin Integrity: Goal: Risk for impaired skin integrity will decrease 02/20/2024 1831 by Dasie Sharlet RAMAN, RN Outcome: Progressing 02/20/2024 1828 by Dasie Sharlet RAMAN, RN Outcome: Progressing   Problem: Education: Goal: Ability to identify signs and symptoms of gastrointestinal bleeding will improve 02/20/2024 1831 by Dasie Sharlet RAMAN, RN Outcome: Progressing 02/20/2024 1828 by Dasie Sharlet RAMAN, RN Outcome: Progressing   Problem: Bowel/Gastric: Goal: Will show no signs and symptoms of gastrointestinal bleeding 02/20/2024 1831 by Dasie Sharlet RAMAN, RN Outcome: Progressing 02/20/2024 1828 by Dasie Sharlet RAMAN, RN Outcome:  Progressing   Problem: Fluid Volume: Goal: Will show no signs and symptoms of excessive bleeding 02/20/2024 1831 by Dasie Sharlet RAMAN, RN Outcome: Progressing 02/20/2024 1828 by Dasie Sharlet RAMAN, RN Outcome: Progressing   Problem: Clinical Measurements: Goal: Complications related to the disease process, condition or treatment will be avoided or minimized 02/20/2024 1831 by Dasie Sharlet RAMAN, RN Outcome: Progressing 02/20/2024 1828 by Dasie Sharlet RAMAN, RN Outcome: Progressing

## 2024-02-20 NOTE — Transfer of Care (Signed)
 Immediate Anesthesia Transfer of Care Note  Patient: Morgan Hubbard  Procedure(s) Performed: EGD (ESOPHAGOGASTRODUODENOSCOPY) COLONOSCOPY  Patient Location: PACU  Anesthesia Type:General  Level of Consciousness: sedated  Airway & Oxygen Therapy: Patient Spontanous Breathing  Post-op Assessment: Report given to RN and Post -op Vital signs reviewed and stable  Post vital signs: Reviewed and stable  Last Vitals:  Vitals Value Taken Time  BP 126/43 02/20/24 08:04  Temp 36.3 C 02/20/24 08:04  Pulse 68 02/20/24 08:05  Resp 15 02/20/24 08:05  SpO2 93 % 02/20/24 08:05  Vitals shown include unfiled device data.  Last Pain:  Vitals:   02/20/24 0804  TempSrc: Temporal  PainSc: Asleep         Complications: No notable events documented.

## 2024-02-20 NOTE — Op Note (Signed)
 [facility] Patient Name: Misaki Sozio Procedure Date: 02/20/2024 7:20 AM MRN: [id number] Account #: [id number] Date of Birth: May 19, 1935 Admit Type: Inpatient Age: 89 Room: [facility] ENDO ROOM 4 Gender: Female Note Status: Finalized Instrument Name: Colon Scope [id number] Procedure:             Colonoscopy Indications:           Iron deficiency anemia Providers:             Rogelia Copping MD, MD Referring MD:          Laine LABOR. Tower (Referring MD) Medicines:             Propofol  per Anesthesia Complications:         No immediate complications. Procedure:             Pre-Anesthesia Assessment:                        - Prior to the procedure, a History and Physical was                         performed, and patient medications and allergies were                         reviewed. The patient's tolerance of previous                         anesthesia was also reviewed. The risks and benefits                         of the procedure and the sedation options and risks                         were discussed with the patient. All questions were                         answered, and informed consent was obtained. Prior                         Anticoagulants: The patient has taken no anticoagulant                         or antiplatelet agents except for aspirin . ASA Grade                         Assessment: II - A patient with mild systemic disease.                         After reviewing the risks and benefits, the patient                         was deemed in satisfactory condition to undergo the                         procedure.                        After obtaining informed consent, the colonoscope was  passed under direct vision. Throughout the procedure,                         the patient's blood pressure, pulse, and oxygen                         saturations were monitored continuously. The                         Colonoscope was  introduced through the anus and                         advanced to the the cecum, identified by appendiceal                         orifice and ileocecal valve. The colonoscopy was                         performed without difficulty. The patient tolerated                         the procedure well. The quality of the bowel                         preparation was excellent. Findings:      The perianal and digital rectal examinations were normal.      A 4 mm polyp was found in the descending colon. The polyp was sessile.       The polyp was removed with a cold snare. Resection and retrieval were       complete.      A single small angiodysplastic lesion without bleeding was found in the       ascending colon. Coagulation for hemostasis using argon plasma at 2       liters/minute and 20 watts was successful.      Many small-mouthed diverticula were found in the sigmoid colon.      Non-bleeding internal hemorrhoids were found during retroflexion. The       hemorrhoids were Grade I (internal hemorrhoids that do not prolapse). Impression:            - One 4 mm polyp in the descending colon, removed with                         a cold snare. Resected and retrieved.                        - A single non-bleeding colonic angiodysplastic                         lesion. Treated with argon plasma coagulation (APC).                        - Diverticulosis in the sigmoid colon.                        - Non-bleeding internal hemorrhoids. Recommendation:        - Return patient to hospital ward for ongoing care.                        -  Resume previous diet.                        - Continue present medications.                        - Await pathology results.                        - To visualize the small bowel, perform video capsule                         endoscopy today. Procedure Code(s):     --- Professional ---                        [phone], 59, Colonoscopy, flexible; with control of                          bleeding, any method                        [id number], Colonoscopy, flexible; with removal of                         tumor(s), polyp(s), or other lesion(s) by snare                         technique Diagnosis Code(s):     --- Professional ---                        D50.9, Iron deficiency anemia, unspecified                        K55.20, Angiodysplasia of colon without hemorrhage                        D12.4, Benign neoplasm of descending colon CPT copyright 2022 [employer]. All rights reserved. The codes documented in this report are preliminary and upon coder review may  be revised to meet current compliance requirements. Rogelia Copping MD, MD 02/20/2024 8:18:43 AM This report has been signed electronically. Number of Addenda: 0 Note Initiated On: 02/20/2024 7:20 AM Scope Withdrawal Time: 0 hours 10 minutes 20 seconds  Total Procedure Duration: 0 hours 14 minutes 48 seconds  Estimated Blood Loss:  Estimated blood loss: none.      [facility]

## 2024-02-20 NOTE — Op Note (Signed)
 [facility] Gastroenterology Patient Name: Morgan Hubbard Procedure Date: 02/20/2024 7:23 AM MRN: [id number] Account #: [id number] Date of Birth: Jun 22, 1934 Admit Type: Inpatient Age: 89 Room: [facility] ENDO ROOM 3 Gender: Female Note Status: Finalized Instrument Name: Barnie GI Scope [id number] Procedure:             Upper GI endoscopy Indications:           Iron deficiency anemia Providers:             Rogelia Copping MD, MD Referring MD:          Laine LABOR. Tower (Referring MD) Medicines:             Propofol  per Anesthesia Complications:         No immediate complications. Procedure:             Pre-Anesthesia Assessment:                        - Prior to the procedure, a History and Physical was                         performed, and patient medications and allergies were                         reviewed. The patient's tolerance of previous                         anesthesia was also reviewed. The risks and benefits                         of the procedure and the sedation options and risks                         were discussed with the patient. All questions were                         answered, and informed consent was obtained. Prior                         Anticoagulants: The patient has taken no anticoagulant                         or antiplatelet agents. ASA Grade Assessment: II - A                         patient with mild systemic disease. After reviewing                         the risks and benefits, the patient was deemed in                         satisfactory condition to undergo the procedure.                        After obtaining informed consent, the endoscope was                         passed under direct vision. Throughout the procedure,  the patient's blood pressure, pulse, and oxygen                         saturations were monitored continuously. The Endoscope                         was introduced through the mouth, and advanced  to the                         third part of duodenum. The upper GI endoscopy was                         accomplished without difficulty. The patient tolerated                         the procedure well. Findings:      A small hiatal hernia was present.      The entire examined stomach was normal.      The examined duodenum was normal. Impression:            - Small hiatal hernia.                        - Normal stomach.                        - Normal examined duodenum.                        - No specimens collected. Recommendation:        - Return patient to hospital ward for ongoing care.                        - Resume previous diet.                        - Continue present medications.                        - Perform a colonoscopy today. Procedure Code(s):     --- Professional ---                        [phone], Esophagogastroduodenoscopy, flexible,                         transoral; diagnostic, including collection of                         specimen(s) by brushing or washing, when performed                         (separate procedure) Diagnosis Code(s):     --- Professional ---                        D50.9, Iron deficiency anemia, unspecified CPT copyright 2022 American Medical Association. All rights reserved. The codes documented in this report are preliminary and upon coder review may  be revised to meet current compliance requirements. Rogelia Copping MD, MD 02/20/2024 7:45:09 AM This report has been signed electronically. Number of Addenda: 0 Note Initiated On: 02/20/2024 7:23 AM  Estimated Blood Loss:  Estimated blood loss: none.      [facility]

## 2024-02-21 DIAGNOSIS — K635 Polyp of colon: Secondary | ICD-10-CM | POA: Diagnosis not present

## 2024-02-21 DIAGNOSIS — K922 Gastrointestinal hemorrhage, unspecified: Secondary | ICD-10-CM | POA: Diagnosis not present

## 2024-02-21 DIAGNOSIS — D649 Anemia, unspecified: Secondary | ICD-10-CM | POA: Diagnosis not present

## 2024-02-21 DIAGNOSIS — K5520 Angiodysplasia of colon without hemorrhage: Secondary | ICD-10-CM | POA: Diagnosis not present

## 2024-02-21 LAB — HEMOGLOBIN AND HEMATOCRIT, BLOOD
HCT: 27.2 % — ABNORMAL LOW (ref 36.0–46.0)
Hemoglobin: 8.5 g/dL — ABNORMAL LOW (ref 12.0–15.0)

## 2024-02-21 LAB — GLUCOSE, CAPILLARY
Glucose-Capillary: 265 mg/dL — ABNORMAL HIGH (ref 70–99)
Glucose-Capillary: 394 mg/dL — ABNORMAL HIGH (ref 70–99)

## 2024-02-21 MED ORDER — DAPAGLIFLOZIN PROPANEDIOL 5 MG PO TABS
5.0000 mg | ORAL_TABLET | Freq: Every day | ORAL | Status: DC
  Administered 2024-02-21: 5 mg via ORAL
  Filled 2024-02-21: qty 1

## 2024-02-21 MED ORDER — HYDROCORTISONE 1 % EX CREA
TOPICAL_CREAM | Freq: Two times a day (BID) | CUTANEOUS | Status: DC
  Administered 2024-02-21: 1 via TOPICAL
  Filled 2024-02-21: qty 28

## 2024-02-21 MED ORDER — POLYSACCHARIDE IRON COMPLEX 150 MG PO CAPS: 150.0000 mg | ORAL_CAPSULE | Freq: Every day | ORAL | 2 refills | Status: DC

## 2024-02-21 MED ORDER — PANTOPRAZOLE SODIUM 40 MG PO TBEC: 40.0000 mg | DELAYED_RELEASE_TABLET | Freq: Two times a day (BID) | ORAL | 3 refills | Status: AC

## 2024-02-21 MED ORDER — INSULIN GLARGINE 100 UNIT/ML ~~LOC~~ SOLN
26.0000 [IU] | Freq: Every day | SUBCUTANEOUS | Status: DC
  Administered 2024-02-21: 26 [IU] via SUBCUTANEOUS
  Filled 2024-02-21 (×2): qty 0.26

## 2024-02-21 MED ORDER — POLYSACCHARIDE IRON COMPLEX 150 MG PO CAPS
150.0000 mg | ORAL_CAPSULE | Freq: Every day | ORAL | Status: DC
  Administered 2024-02-21: 150 mg via ORAL
  Filled 2024-02-21: qty 1

## 2024-02-21 NOTE — Progress Notes (Signed)
 Pt/daughter/spouse given dc/rx instructions, also advised to return to an ED as needed/worse. Pt/spouse/daughter voice understanding. Pt with her cane, all belongings and valuables. Pt wheeled out, [place] No distress noted with pt.

## 2024-02-21 NOTE — Plan of Care (Signed)
  Problem: Clinical Measurements: Goal: Ability to maintain clinical measurements within normal limits will improve Outcome: Progressing   Problem: Pain Managment: Goal: General experience of comfort will improve and/or be controlled Outcome: Progressing   Problem: Safety: Goal: Ability to remain free from injury will improve Outcome: Progressing   Problem: Skin Integrity: Goal: Risk for impaired skin integrity will decrease Outcome: Progressing

## 2024-02-21 NOTE — Discharge Summary (Signed)
 Physician Discharge Summary   Patient: Morgan Hubbard MRN: [id number] DOB: 06/27/1934  Admit date:     02/18/2024  Discharge date: 02/21/24  Discharge Physician: Leita Blanch   PCP: Randeen Laine LABOR, MD   Recommendations at discharge:   follow-up PCP in 1 to 2 week follow-up [facility] Dr. Onita as needed  Discharge Diagnoses: Principal Problem:   Upper GI bleed Active Problems:   Hyperkalemia   Symptomatic anemia   Type II diabetes mellitus with renal manifestations (HCC)   Neuropathy   Hyperlipidemia associated with type 2 diabetes mellitus (HCC)   Essential hypertension   GERD (gastroesophageal reflux disease)   CKD stage 3b, GFR 30-44 ml/min (HCC)   GI bleed   Polyp of descending colon   Angiodysplasia of colon  Morgan Hubbard is a 89 year old female with history of atrial fibrillation on Xarelto, hypertension, hyperlipidemia, insulin -dependent diabetes mellitus, who presents emergency department for chief concerns of abnormal labs outpatient.  Patient reports that for the past month she has been dealing with generalized weakness and fatigue as well as dyspnea on exertion. She has noticed dark stools intermittently during this time, but has not noticed any blood in her stool and has not had any vomiting.  She denies any history of GI bleeding, states her last colonoscopy was many years ago.    Her Hgb was 7.2 (13.4--several months ago)   GI bleed/Melena in the setting of chronic anticoagulation Symptomatic anemia --Suspect secondary to Xarelto use in setting of atrial fibrillation --Will hold Xarelto on admission --came in with hgb 7.2--s/p 1 unit PRBC-- 8.5 --Continue Protonix  40 mg IV twice daily --Gastroenterology consult with Dr wohl--EGD/colonoscopy  --EGD  - Small hiatal hernia.                        - Normal stomach.                        - Normal examined duodenum.                        - No specimens collected.  --Colonoscopy  - One 4 mm polyp in the descending  colon, removed with                         a cold snare. Resected and retrieved.                        - A single non-bleeding colonic angiodysplastic lesion. Treated with argon plasma coagulation (APC).                        - Diverticulosis in the sigmoid colon.                        - Non-bleeding internal hemorrhoids. --Capsule study--small SI ulcer--no acitve bleeding noted -- repeat hemoglobin remains at 8.6, 8.5. Okay to resume Xarelto per Dr. Onita. -- Start oral iron . Patient informed of tarry stools with it.   Hyperkalemia --resolved   Neuropathy --cont home gabapentin     Type II diabetes mellitus with renal manifestations (HCC) Insulin -dependent diabetes mellitus --Patient is on insulin  degludec 26 units nightly which is resumed at d/c --Insulin  SSI with at bedtime coverage ordered   CKD stage 3b, GFR 30-44 ml/min (HCC) --At baseline   GERD (gastroesophageal  reflux disease) --PPI twice daily   Essential hypertension --cont coreg  and Imdur    Hyperlipidemia associated with type 2 diabetes mellitus (HCC) --Home atorvastatin     Procedures: EGD, colonoscopy, capsule study Family communication :husband at bedside Consults :GI CODE STATUS: full DVT Prophylaxis :SCD--now xarelto resumed at d/c     Disposition: Home Diet recommendation:  Cardiac and Carb modified diet DISCHARGE MEDICATION: Allergies as of 02/21/2024       Reactions   Ace Inhibitors Swelling   REACTION: tongue swelling   Lisinopril Swelling   Swelling of tongue   Insulin  Detemir Itching   Nsaids Other (See Comments)   Renal issues   Codeine Nausea Only   Hydrochlorothiazide Other (See Comments)   Dizziness/funny feeling   Tylenol  [acetaminophen ] Other (See Comments)   Fatty liver - use with caution        Medication List     TAKE these medications    atorvastatin  80 MG tablet Commonly known as: LIPITOR  Take 1 tablet (80 mg total) by mouth daily.   BIOTIN 5000 PO Take 5,000  mcg by mouth daily.   carvedilol  12.5 MG tablet Commonly known as: COREG  Take 1 tablet (12.5 mg total) by mouth 2 (two) times daily with a meal.   cetirizine 10 MG tablet Commonly known as: ZYRTEC Take 10 mg by mouth daily.   clotrimazole-betamethasone cream Commonly known as: LOTRISONE Apply 1 application topically as needed (groin).   cyanocobalamin  1000 MCG tablet Commonly known as: VITAMIN B12 Take 500 mcg by mouth daily.   famotidine  20 MG tablet Commonly known as: PEPCID  Take 1 tablet by mouth once daily   Farxiga  5 MG Tabs tablet Generic drug: dapagliflozin  propanediol Take 5 mg by mouth daily.   FreeStyle Libre 3 Sensor Misc USE TO CHECK GLUCOSE CONTINOUSLY. CHANGE EVERY 14 DAYS   gabapentin  300 MG capsule Commonly known as: NEURONTIN  TAKE 1 CAPSULE BY MOUTH THREE TIMES DAILY   hydrocortisone  2.5 % cream Apply topically 3 (three) times daily as needed.   insulin  degludec 100 UNIT/ML FlexTouch Pen Commonly known as: TRESIBA Inject 26 Units into the skin at bedtime.   iron  polysaccharides 150 MG capsule Commonly known as: NIFEREX Take 1 capsule (150 mg total) by mouth daily.   isosorbide  mononitrate 30 MG 24 hr tablet Commonly known as: IMDUR  Take 1 tablet (30 mg total) by mouth daily.   LORazepam  0.5 MG tablet Commonly known as: ATIVAN  Take 1 tablet by mouth every 8 (eight) hours as needed.   Nebulizer/Tubing/Mouthpiece Kit 1 each by Does not apply route 3 (three) times daily as needed.   nystatin cream Commonly known as: MYCOSTATIN Apply 1 application topically 2 (two) times daily as needed (irritation.).   Ozempic (2 MG/DOSE) 8 MG/3ML Sopn Generic drug: Semaglutide (2 MG/DOSE) Inject 2 mg into the skin once a week.   pantoprazole  40 MG tablet Commonly known as: PROTONIX  Take 1 tablet (40 mg total) by mouth 2 (two) times daily before a meal. What changed: when to take this   Rivaroxaban 15 MG Tabs tablet Commonly known as: XARELTO Take 15  mg by mouth daily with supper.   traMADol  50 MG tablet Commonly known as: ULTRAM  Take 1-2 tablets (50-100 mg total) by mouth every 6 (six) hours as needed for severe pain (pain score 7-10).   triamcinolone  cream 0.1 % Commonly known as: KENALOG  Apply 1 Application topically 2 (two) times daily as needed.   VITAMIN D  PO Take by mouth.  Follow-up Information     Tower, Laine LABOR, MD. Schedule an appointment as soon as possible for a visit in 1 week(s).   Specialties: Family Medicine, Radiology Contact information: [address] [place] [address] [phone]         Onita Elspeth Sharper, DO. Call.   Specialty: Gastroenterology Why: If symptoms worsen, As needed Contact information: [address] [facility] [place] [address] [phone]                Discharge Exam: Fredricka Weights   02/18/24 1634  Weight: 66.2 kg  GENERAL:  89 y.o.-year-old patient with no acute distress.  LUNGS: Normal breath sounds bilaterally CARDIOVASCULAR: S1, S2 normal. No murmur   ABDOMEN: Soft, nontender, nondistended.  EXTREMITIES: No  edema b/l.    NEUROLOGIC: nonfocal  patient is alert and awake   Condition at discharge: fair  The results of significant diagnostics from this hospitalization (including imaging, microbiology, ancillary and laboratory) are listed below for reference.   Imaging Studies: No results found.  Microbiology: Results for orders placed or performed in visit on 04/23/23  Urine Culture     Status: None   Collection Time: 04/23/23  4:14 PM   Specimen: Urine  Result Value Ref Range Status   MICRO NUMBER: [id number]  Final   SPECIMEN QUALITY: Adequate  Final   Sample Source URINE  Final   STATUS: FINAL  Final   Result:   Final    Less than 10,000 CFU/mL of single Gram negative organism isolated. No further testing will be performed. If clinically indicated, recollection using a method to minimize contamination, with  prompt transfer to Urine Culture Transport Tube, is recommended.    Labs: CBC: Recent Labs  Lab 02/17/24 1153 02/18/24 1638 02/19/24 0439 02/20/24 1411 02/21/24 1038  WBC 6.5 6.2 7.3  --   --   HGB 7.2* 7.4* 8.5* 8.6* 8.5*  HCT 24.5* 23.3* 26.7*  --  27.2*  MCV 95 88.3 88.7  --   --   PLT 195 221 212  --   --    Basic Metabolic Panel: Recent Labs  Lab 02/17/24 1153 02/18/24 1638 02/19/24 0439  NA 133* 135 141  K 5.2 5.3* 3.8  CL 100 105 110  CO2 20 23 25   GLUCOSE 497* 413* 111*  BUN 21 21 19   CREATININE 1.49* 1.55* 1.47*  CALCIUM  8.1* 8.3* 8.3*   Liver Function Tests: Recent Labs  Lab 02/18/24 1638  AST 18  ALT 13  ALKPHOS 81  BILITOT 0.6  PROT 5.8*  ALBUMIN 3.1*   CBG: Recent Labs  Lab 02/20/24 1126 02/20/24 1604 02/20/24 2058 02/21/24 0740 02/21/24 1127  GLUCAP 154* 377* 359* 265* 394*    Discharge time spent: greater than 30 minutes.  Signed: Leita Blanch, MD [facility] 02/21/2024

## 2024-02-21 NOTE — Progress Notes (Addendum)
 Inpatient Follow-up/Progress Note   Patient ID: Morgan Hubbard is a 89 y.o. female.  Overnight Events / Subjective Findings S/p VCE. Demonstrated small, solitary non-bleeding ulcer in the small bowel (~40 minutes into the study). No further melena. VSS Pt w/o gi complaints.  Review of Systems  Constitutional:  Negative for activity change, appetite change, chills, diaphoresis, fatigue, fever and unexpected weight change.  HENT:  Negative for trouble swallowing and voice change.   Respiratory:  Negative for shortness of breath and wheezing.   Cardiovascular:  Negative for chest pain, palpitations and leg swelling.  Gastrointestinal:  Negative for abdominal distention, abdominal pain, anal bleeding, blood in stool, constipation, diarrhea, nausea, rectal pain and vomiting.  Musculoskeletal:  Negative for arthralgias and myalgias.  Skin:  Negative for color change and pallor.  Neurological:  Negative for dizziness, syncope and weakness.  Psychiatric/Behavioral:  Negative for confusion.   All other systems reviewed and are negative.    Medications  Current Facility-Administered Medications:    acetaminophen  (TYLENOL ) tablet 650 mg, 650 mg, Oral, Q6H PRN **OR** acetaminophen  (TYLENOL ) suppository 650 mg, 650 mg, Rectal, Q6H PRN, Cox, Amy N, DO   atorvastatin  (LIPITOR ) tablet 80 mg, 80 mg, Oral, QHS, Cox, Amy N, DO, 80 mg at 02/20/24 2149   carvedilol  (COREG ) tablet 12.5 mg, 12.5 mg, Oral, BID WC, Cox, Amy N, DO, 12.5 mg at 02/20/24 1638   cyanocobalamin  (VITAMIN B12) tablet 500 mcg, 500 mcg, Oral, Daily, Cox, Amy N, DO, 500 mcg at 02/20/24 1141   gabapentin  (NEURONTIN ) capsule 300 mg, 300 mg, Oral, TID, Cox, Amy N, DO, 300 mg at 02/20/24 2148   hydrALAZINE  (APRESOLINE ) injection 5 mg, 5 mg, Intravenous, Q6H PRN, Cox, Amy N, DO   hydrocortisone  cream 1 %, , Topical, BID, Mansy, Jan A, MD, 1 Application at 02/21/24 0315   insulin  aspart (novoLOG ) injection 0-5 Units, 0-5 Units,  Subcutaneous, QHS, Cox, Amy N, DO, 5 Units at 02/20/24 2149   insulin  aspart (novoLOG ) injection 0-9 Units, 0-9 Units, Subcutaneous, TID WC, Cox, Amy N, DO, 9 Units at 02/20/24 1638   isosorbide  mononitrate (IMDUR ) 24 hr tablet 30 mg, 30 mg, Oral, Daily, Cox, Amy N, DO, 30 mg at 02/20/24 1142   ondansetron  (ZOFRAN ) tablet 4 mg, 4 mg, Oral, Q6H PRN **OR** ondansetron  (ZOFRAN ) injection 4 mg, 4 mg, Intravenous, Q6H PRN, Cox, Amy N, DO   pantoprazole  (PROTONIX ) injection 40 mg, 40 mg, Intravenous, BID, Cox, Amy N, DO, 40 mg at 02/20/24 2148   senna-docusate (Senokot-S) tablet 1 tablet, 1 tablet, Oral, QHS PRN, Cox, Amy N, DO   acetaminophen  **OR** acetaminophen , hydrALAZINE , ondansetron  **OR** ondansetron  (ZOFRAN ) IV, senna-docusate   Objective    Vitals:   02/20/24 1638 02/20/24 1924 02/20/24 2239 02/21/24 0435  BP: (!) 147/53 (!) 124/58 (!) 151/50 (!) 139/48  Pulse: 63 62 66 70  Resp:  18 18 18   Temp:  97.8 F (36.6 C) 98.3 F (36.8 C) 97.7 F (36.5 C)  TempSrc:      SpO2:  96% 97% 94%  Weight:      Height:         Physical Exam Vitals and nursing note reviewed.  Constitutional:      General: She is not in acute distress.    Appearance: Normal appearance. She is not ill-appearing, toxic-appearing or diaphoretic.  HENT:     Head: Normocephalic and atraumatic.     Nose: Nose normal.     Mouth/Throat:     Mouth: Mucous  membranes are moist.     Pharynx: Oropharynx is clear.  Eyes:     General: No scleral icterus.    Extraocular Movements: Extraocular movements intact.  Cardiovascular:     Rate and Rhythm: Normal rate.     Heart sounds: Normal heart sounds. No murmur heard.    No friction rub. No gallop.  Pulmonary:     Effort: Pulmonary effort is normal. No respiratory distress.     Breath sounds: Normal breath sounds. No wheezing, rhonchi or rales.  Abdominal:     General: Bowel sounds are normal. There is no distension.     Palpations: Abdomen is soft.     Tenderness:  There is no abdominal tenderness. There is no guarding or rebound.  Musculoskeletal:     Cervical back: Neck supple.  Skin:    General: Skin is warm and dry.     Coloration: Skin is not jaundiced or pale.  Neurological:     General: No focal deficit present.     Mental Status: She is alert and oriented to person, place, and time. Mental status is at baseline.  Psychiatric:        Mood and Affect: Mood normal.        Behavior: Behavior normal.        Thought Content: Thought content normal.        Judgment: Judgment normal.      Laboratory Data Recent Labs  Lab 02/17/24 1153 02/18/24 1638 02/19/24 0439 02/20/24 1411  WBC 6.5 6.2 7.3  --   HGB 7.2* 7.4* 8.5* 8.6*  HCT 24.5* 23.3* 26.7*  --   PLT 195 221 212  --    Recent Labs  Lab 02/17/24 1153 02/18/24 1638 02/19/24 0439  NA 133* 135 141  K 5.2 5.3* 3.8  CL 100 105 110  CO2 20 23 25   BUN 21 21 19   CREATININE 1.49* 1.55* 1.47*  CALCIUM  8.1* 8.3* 8.3*  PROT  --  5.8*  --   BILITOT  --  0.6  --   ALKPHOS  --  81  --   ALT  --  13  --   AST  --  18  --   GLUCOSE 497* 413* 111*   Recent Labs  Lab 02/18/24 1720  INR 1.7*      Imaging Studies: No results found.  Assessment:   # Small bowel GI bleeding  # ABLA, symptomatic anemia - suspect 2/2 small bowel ulcer. Not actively bleeding at time of VCE. No intervention indicated at this time - egd and csy w/o bleeding  # Melena  # FLD # a fib on xarelto # CHF # NICM  Plan:   Continue bid ppi for 8 weeks Avoid aspirin  and NSAIDs (ibuprofen, Motrin, Advil, naproxen, Aleve, Naprosyn, Goody's powder, & BC powder). Xarelto to be restarted, cautioned with risks given ulcer is still present to bleed from. Should patient notice black stools or bloody stools again, recommend holding.  GI to sign off. Available as needed. Please do not hesitate to call regarding questions or concerns.  Management of other medical comorbidities as per primary team  I  personally performed the service.  Thank you for allowing us  to participate in this patient's care. Please don't hesitate to call if any questions or concerns arise.   Elspeth Ozell Jungling, DO [facility]  Portions of the record may have been created with voice recognition software. Occasional wrong-word or 'sound-a-like' substitutions may have occurred due to  the inherent limitations of voice recognition software.  Read the chart carefully and recognize, using context, where substitutions may have occurred.

## 2024-02-23 ENCOUNTER — Ambulatory Visit: Payer: Self-pay | Admitting: Gastroenterology

## 2024-02-23 ENCOUNTER — Telehealth: Payer: Self-pay

## 2024-02-23 LAB — SURGICAL PATHOLOGY

## 2024-02-23 NOTE — Telephone Encounter (Signed)
 This encounter was created in error - please disregard.

## 2024-02-23 NOTE — Patient Instructions (Signed)
 Visit Information  Thank you for taking time to visit with me today. Please don't hesitate to contact me if I can be of assistance to you.  Patient instructions: Take your medications as prescribed Call and schedule a hospital follow up appointment with your provider or request a virtual visit since you are out of time. Discuss follow up with the gastroenterologist with your primary care provider Notify provider for any new or ongoing GI symptoms Advised to have Nefirex iron  prescription transferred to the [facility] so that you can have it filled.  Notify provider for any new or ongoing or ongoing symptoms.    Patient verbalizes understanding of instructions and care plan provided today and agrees to view in MyChart. Active MyChart status and patient understanding of how to access instructions and care plan via MyChart confirmed with patient.     The patient has been provided with contact information for the care management team and has been advised to call with any health related questions or concerns.   Please call the care guide team at [phone] if you need to cancel or reschedule your appointment.   Please call the Suicide and Crisis Lifeline: 988 call the [place]  National Suicide Prevention Lifeline: 515-428-4514 or TTY: 810-322-8735 TTY 647-162-5270) to talk to a trained counselor call 1-800-273-TALK (toll free, 24 hour hotline) go to [facility] [address], [place] [phone]) if you are experiencing a Mental Health or Behavioral Health Crisis or need someone to talk to.  Arvin Seip RN, BSN, CCM [employer], Population Health Case Manager Phone: [phone]

## 2024-02-23 NOTE — Transitions of Care (Post Inpatient/ED Visit) (Signed)
 02/23/2024  Name: Morgan Hubbard MRN: [id number] DOB: 10/17/1934  Today's TOC FU Call Status: Today's TOC FU Call Status:: Successful TOC FU Call Completed TOC FU Call Complete Date: 02/23/24 Patient's Name and Date of Birth confirmed.  Transition Care Management Follow-up Telephone Call Date of Discharge: 02/21/24 Discharge Facility: [facility]) Type of Discharge: Inpatient Admission Primary Inpatient Discharge Diagnosis:: upper GI bleed How have you been since you were released from the hospital?: Better Any questions or concerns?: Yes Patient Questions/Concerns:: patient states she tried to get her iron  medication prior [place] beach on [place]. She states she was not able to.  Patient states she has her medications filled at [facility]. Patient Questions/Concerns Addressed: Other: (Advised to go [place] [facility] [place] and have them  transfer her iron  prescription. Advised patient that she can have the iron  medication filled where she is located. Advised to start taking the iron  medication once having prescription filled.)  Items Reviewed: Did you receive and understand the discharge instructions provided?: Yes Medications obtained,verified, and reconciled?: Yes (Medications Reviewed) Any new allergies since your discharge?: No Dietary orders reviewed?: Yes Type of Diet Ordered:: cardiac and carb modified diet Do you have support at home?: Yes People in Home [RPT]: spouse Name of Support/Comfort Primary Source: Morgan Hubbard  Medications Reviewed Today: Medications Reviewed Today     Reviewed by Falesha Schommer E, RN (Registered Nurse) on 02/23/24 at 1028  Med List Status: <None>   Medication Order Taking? Sig Documenting Provider Last Dose Status Informant  atorvastatin  (LIPITOR ) 80 MG tablet [id number] Yes Take 1 tablet (80 mg total) by mouth daily. Gollan, Timothy J, MD  Active Self, Spouse/Significant Other  BIOTIN 5000 PO [id number] Yes  Take 5,000 mcg by mouth daily. [provider]  Active Self, Spouse/Significant Other  carvedilol  (COREG ) 12.5 MG tablet [id number] Yes Take 1 tablet (12.5 mg total) by mouth 2 (two) times daily with a meal. Gollan, Timothy J, MD  Active Self, Spouse/Significant Other  cetirizine (ZYRTEC) 10 MG tablet [id number] Yes Take 10 mg by mouth daily. [provider]  Active Self, Spouse/Significant Other           Med Note [facility], ELIZABETH A   Wed Feb 18, 2024  6:34 PM) Twice a day  clotrimazole-betamethasone (LOTRISONE) cream [id number] Yes Apply 1 application topically as needed (groin).  [provider]  Active Self, Spouse/Significant Other           Med Note WENONA, AMBER B   Wed Jun 10, 2018  5:40 PM)    Continuous Glucose Sensor (FREESTYLE LIBRE 3 SENSOR) [place] [id number]  USE TO CHECK GLUCOSE CONTINOUSLY. CHANGE EVERY 14 DAYS Dugal, Tabitha, FNP  Active Self, Spouse/Significant Other  dapagliflozin  propanediol (FARXIGA ) 5 MG TABS tablet [id number] Yes Take 5 mg by mouth daily. [provider]  Active Self, Spouse/Significant Other  famotidine  (PEPCID ) 20 MG tablet [id number] Yes Take 1 tablet by mouth once daily Tower, Laine LABOR, MD  Active Self, Spouse/Significant Other  gabapentin  (NEURONTIN ) 300 MG capsule [id number] Yes TAKE 1 CAPSULE BY MOUTH THREE TIMES DAILY Tower, Laine LABOR, MD  Active Self, Spouse/Significant Other  hydrocortisone  2.5 % cream [id number] Yes Apply topically 3 (three) times daily as needed. Letvak, Richard I, MD  Active Self, Spouse/Significant Other  insulin  degludec (TRESIBA) 100 UNIT/ML FlexTouch Pen [id number] Yes Inject 26 Units into the skin at bedtime. [provider]  Active Self, Spouse/Significant Other  Med Note JOSEPHINA MORNA SAILOR   Tue Nov 05, 2022  4:30 PM) [facility] PAP - Dr Damian  iron  polysaccharides (NIFEREX) 150 MG capsule [id number]  Take 1 capsule (150 mg total) by mouth daily.  Patient not taking: Reported on  02/23/2024   Patel, Sona, MD  Active   isosorbide  mononitrate (IMDUR ) 30 MG 24 hr tablet [id number] Yes Take 1 tablet (30 mg total) by mouth daily. Furth, Cadence H, PA-C  Active Self, Spouse/Significant Other  LORazepam  (ATIVAN ) 0.5 MG tablet [id number] Yes Take 1 tablet by mouth every 8 (eight) hours as needed. [provider]  Active Self, Spouse/Significant Other  nystatin cream (MYCOSTATIN) [id number] Yes Apply 1 application topically 2 (two) times daily as needed (irritation.).  [provider]  Active Self, Spouse/Significant Other  pantoprazole  (PROTONIX ) 40 MG tablet [id number] Yes Take 1 tablet (40 mg total) by mouth 2 (two) times daily before a meal. Tobie Calix, MD  Active   Respiratory Therapy Supplies (NEBULIZER/TUBING/MOUTHPIECE) KIT [id number]  1 each by Does not apply route 3 (three) times daily as needed.  Patient not taking: Reported on 02/23/2024   Dugal, Tabitha, FNP  Active Self, Spouse/Significant Other  Rivaroxaban (XARELTO) 15 MG TABS tablet [id number]  Take 15 mg by mouth daily with supper. [provider]  Active Self, Spouse/Significant Other  Semaglutide, 2 MG/DOSE, (OZEMPIC, 2 MG/DOSE,) 8 MG/3ML SOPN [id number]  Inject 2 mg into the skin once a week.  Patient not taking: Reported on 02/23/2024   Solum, Anna M, MD  Active Self, Spouse/Significant Other           Med Note JOSEPHINA MORNA SAILOR   Tue Nov 05, 2022  4:37 PM) [facility] [facility] PAP - Dr Solum  traMADol  (ULTRAM ) 50 MG tablet [id number] Yes Take 1-2 tablets (50-100 mg total) by mouth every 6 (six) hours as needed for severe pain (pain score 7-10). Tower, Laine LABOR, MD  Active Self, Spouse/Significant Other  triamcinolone  cream (KENALOG ) 0.1 % [id number] Yes Apply 1 Application topically 2 (two) times daily as needed. Jimmy Charlie FERNS, MD  Active Self, Spouse/Significant Other  vitamin B-12 (CYANOCOBALAMIN ) 1000 MCG tablet [id number]  Take 500 mcg by mouth daily.  Patient not taking: Reported on 02/23/2024    [provider]  Active Self, Spouse/Significant Other  VITAMIN D  PO [id number]  Take by mouth.  Patient not taking: Reported on 02/23/2024   [provider]  Active Self, Spouse/Significant Other            Home Care and Equipment/Supplies: Were Home Health Services Ordered?: No Any new equipment or medical supplies ordered?: No  Functional Questionnaire: Do you need assistance with bathing/showering or dressing?: No Do you need assistance with meal preparation?: No Do you need assistance with eating?: No Do you have difficulty maintaining continence: No Do you need assistance with getting out of bed/getting out of a chair/moving?: No Do you have difficulty managing or taking your medications?: No  Follow up appointments reviewed: PCP Follow-up appointment confirmed?: No (patient states she will call and schedule due to being out of town for now.) Specialist Hospital Follow-up appointment confirmed?: No Reason Specialist Follow-Up Not Confirmed: [facility] Calling Clinician Notified Provider Practice of Needed Appointment (patient states she is out of town at [employer] and will follow up with her primary care provider about follow up with the gastroenterologist. Message sent to primary care provider regarding need for gastroenterology appointment) Do you need transportation to your follow-up appointment?: No Do you understand  care options if your condition(s) worsen?: Yes-patient verbalized understanding  SDOH Interventions Today    Flowsheet Row Most Recent Value  SDOH Interventions   Food Insecurity Interventions Intervention Not Indicated  Housing Interventions Intervention Not Indicated  Transportation Interventions Intervention Not Indicated  Utilities Interventions Intervention Not Indicated   Discussed and offered 30 day TOC program.  Patient declined.  The patient has been provided with contact information for the care management team and has been advised to  call with any health -related questions or concerns.  The patient verbalized understanding with current plan of care.  The patient is directed to their insurance card regarding availability of benefits coverage.    Arvin Seip RN, BSN, CCM [employer], Population Health Case Manager Phone: [phone]

## 2024-02-28 DIAGNOSIS — Z95810 Presence of automatic (implantable) cardiac defibrillator: Secondary | ICD-10-CM | POA: Diagnosis not present

## 2024-02-28 DIAGNOSIS — I10 Essential (primary) hypertension: Secondary | ICD-10-CM | POA: Diagnosis not present

## 2024-02-28 DIAGNOSIS — Z4502 Encounter for adjustment and management of automatic implantable cardiac defibrillator: Secondary | ICD-10-CM | POA: Diagnosis not present

## 2024-02-28 DIAGNOSIS — E785 Hyperlipidemia, unspecified: Secondary | ICD-10-CM | POA: Diagnosis not present

## 2024-02-28 DIAGNOSIS — I428 Other cardiomyopathies: Secondary | ICD-10-CM | POA: Diagnosis not present

## 2024-02-28 DIAGNOSIS — E1169 Type 2 diabetes mellitus with other specified complication: Secondary | ICD-10-CM | POA: Diagnosis not present

## 2024-03-01 ENCOUNTER — Ambulatory Visit (INDEPENDENT_AMBULATORY_CARE_PROVIDER_SITE_OTHER): Admitting: Family Medicine

## 2024-03-01 ENCOUNTER — Encounter: Payer: Self-pay | Admitting: Family Medicine

## 2024-03-01 VITALS — BP 122/64 | HR 70 | Temp 97.8°F | Ht 61.0 in | Wt 146.5 lb

## 2024-03-01 DIAGNOSIS — K922 Gastrointestinal hemorrhage, unspecified: Secondary | ICD-10-CM | POA: Diagnosis not present

## 2024-03-01 DIAGNOSIS — K635 Polyp of colon: Secondary | ICD-10-CM

## 2024-03-01 DIAGNOSIS — D649 Anemia, unspecified: Secondary | ICD-10-CM | POA: Diagnosis not present

## 2024-03-01 DIAGNOSIS — N1832 Chronic kidney disease, stage 3b: Secondary | ICD-10-CM | POA: Diagnosis not present

## 2024-03-01 DIAGNOSIS — K5520 Angiodysplasia of colon without hemorrhage: Secondary | ICD-10-CM | POA: Diagnosis not present

## 2024-03-01 LAB — BASIC METABOLIC PANEL WITH GFR
BUN: 19 mg/dL (ref 6–23)
CO2: 30 meq/L (ref 19–32)
Calcium: 8.6 mg/dL (ref 8.4–10.5)
Chloride: 105 meq/L (ref 96–112)
Creatinine, Ser: 1.58 mg/dL — ABNORMAL HIGH (ref 0.40–1.20)
GFR: 29.32 mL/min — ABNORMAL LOW (ref >60.00)
Glucose, Bld: 212 mg/dL — ABNORMAL HIGH (ref 70–99)
Potassium: 4.8 meq/L (ref 3.5–5.1)
Sodium: 141 meq/L (ref 135–145)

## 2024-03-01 LAB — CBC WITH DIFFERENTIAL/PLATELET
Basophils Absolute: 0 K/uL (ref 0.0–0.1)
Basophils Relative: 0.6 % (ref 0.0–3.0)
Eosinophils Absolute: 0.3 K/uL (ref 0.0–0.7)
Eosinophils Relative: 3.8 % (ref 0.0–5.0)
HCT: 31.6 % — ABNORMAL LOW (ref 36.0–46.0)
Hemoglobin: 10.1 g/dL — ABNORMAL LOW (ref 12.0–15.0)
Lymphocytes Relative: 26.8 % (ref 12.0–46.0)
Lymphs Abs: 1.8 K/uL (ref 0.7–4.0)
MCHC: 31.8 g/dL (ref 30.0–36.0)
MCV: 87.1 fl (ref 78.0–100.0)
Monocytes Absolute: 0.5 K/uL (ref 0.1–1.0)
Monocytes Relative: 7.3 % (ref 3.0–12.0)
Neutro Abs: 4.1 K/uL (ref 1.4–7.7)
Neutrophils Relative %: 61.5 % (ref 43.0–77.0)
Platelets: 199 K/uL (ref 150.0–400.0)
RBC: 3.63 Mil/uL — ABNORMAL LOW (ref 3.87–5.11)
RDW: 16.4 % — ABNORMAL HIGH (ref 11.5–15.5)
WBC: 6.6 K/uL (ref 4.0–10.5)

## 2024-03-01 LAB — IRON: Iron: 69 ug/dL (ref 42–145)

## 2024-03-01 NOTE — Assessment & Plan Note (Signed)
 Small  Noted on colonoscopy  Not source of anemia

## 2024-03-01 NOTE — Assessment & Plan Note (Signed)
 Angiodysplastic lesion/colon treated with APC Small SI ulcer   Reviewed hospital records, lab results and studies in detail  Now back on xarelto  Cbc today  Taking protonix  bid   For GI follow up if blood count does not stabilize

## 2024-03-01 NOTE — Assessment & Plan Note (Signed)
 This was stable during hosp for GI bleed   Lab today  Encouraged fluid intake along with iron 

## 2024-03-01 NOTE — Assessment & Plan Note (Signed)
 Reviewed hospital records, lab results and studies in detail  From GI bleed  Admit hb 7.2, went up to 8.5 after transfusion Now on oral iron   Still tired  GI bleeding presumed stopped  Back on xarelto   Cbc /iron  today  Continue niferex 150 mg daily   Watch for re occurrence of bleeding  May need transfusion or iron  inf if oral iron  is not tolerated or effective   Call back and Er precautions noted in detail today

## 2024-03-01 NOTE — Assessment & Plan Note (Signed)
 See a/p for small bowel ulcer Reviewed hospital records, lab results and studies in detail   Stable currently

## 2024-03-01 NOTE — Assessment & Plan Note (Signed)
 May be one source of her recent GI bleed Reviewed hospital records, lab results and studies in detail  This was treated with argon plasma coagulation   Currently taking iron   Will follow labs and symptoms  Back on xarelto for a fib   May refer to GI ([facility] Dr Onita) if bleeding or evidence of bleeding re occurs

## 2024-03-01 NOTE — Progress Notes (Signed)
 Subjective:    Patient ID: Morgan Hubbard, female    DOB: 1935/05/08, 89 y.o.   MRN: [id number]  HPI  Wt Readings from Last 3 Encounters:  03/01/24 146 lb 8 oz (66.5 kg)  02/18/24 146 lb (66.2 kg)  02/17/24 146 lb (66.2 kg)   27.68 kg/m  Vitals:   03/01/24 1132  BP: 122/64  Pulse: 70  Temp: 97.8 F (36.6 C)  SpO2: 95%     Pt presents for follow up  Anemia with hospitalization   Hosp 9/17 to 9/20 Presented with weakness/ dark stools  Found to have GI bleed in setting of anticoagulation with xarelto  Hb 7.2 at admit - after 1 u prbc 8.5  Given protonix  40 mg IV bid  Colonic angiodysplastic lesion treated with argon plasma coagulation  One small SI ulcer on capsule study   Held xarelto  Hb 8.6 -was ok to resume xarelto  Oral iron  ordered   Per discharge summary GI bleed/Melena in the setting of chronic anticoagulation Symptomatic anemia --Suspect secondary to Xarelto use in setting of atrial fibrillation --Will hold Xarelto on admission --came in with hgb 7.2--s/p 1 unit PRBC-- 8.5 --Continue Protonix  40 mg IV twice daily --Gastroenterology consult with Dr wohl--EGD/colonoscopy  --EGD  - Small hiatal hernia.                        - Normal stomach.                        - Normal examined duodenum.                        - No specimens collected.  --Colonoscopy  - One 4 mm polyp in the descending colon, removed with                         a cold snare. Resected and retrieved.                        - A single non-bleeding colonic angiodysplastic lesion. Treated with argon plasma coagulation (APC).                        - Diverticulosis in the sigmoid colon.                        - Non-bleeding internal hemorrhoids. --Capsule study--small SI ulcer--no acitve bleeding noted -- repeat hemoglobin remains at 8.6, 8.5. Okay to resume Xarelto per Dr. Onita. -- Start oral iron . Patient informed of tarry stools with it.  Recommended follow up here Also [facility] Dr  Onita   Was prescription niferex- plans ot pick up / was just covered   Protonix  40 mg bid   Is still very tired  Did just start the iron      Lab Results  Component Value Date   NA 141 02/19/2024   K 3.8 02/19/2024   CO2 25 02/19/2024   GLUCOSE 111 (H) 02/19/2024   BUN 19 02/19/2024   CREATININE 1.47 (H) 02/19/2024   CALCIUM  8.3 (L) 02/19/2024   GFR 26.37 (L) 10/08/2023   EGFR 34 (L) 02/17/2024   GFRNONAA 34 (L) 02/19/2024   Lab Results  Component Value Date   WBC 7.3 02/19/2024   HGB 8.5 (L) 02/21/2024   HCT 27.2 (  L) 02/21/2024   MCV 88.7 02/19/2024   PLT 212 02/19/2024   Lab Results  Component Value Date   IRON  71 03/26/2023   FERRITIN 27.2 11/05/2021        Patient Active Problem List   Diagnosis Date Noted   Polyp of descending colon 02/20/2024   Angiodysplasia of colon 02/20/2024   GI bleed 02/19/2024   Upper GI bleed 02/18/2024   CKD stage 3b, GFR 30-44 ml/min (HCC) 02/18/2024   Perioral dermatitis 01/15/2024   Laceration of left leg 12/29/2023   Pruritus 10/08/2023   Poor balance 07/09/2023   Injury of right toe, initial encounter 04/23/2023   Current use of proton pump inhibitor 03/24/2023   Rectal bleeding 02/17/2023   Senile purpura 01/20/2023   Pain due to onychomycosis of toenails of both feet 04/04/2022   Pre-syncope 11/29/2021   Hypotension 11/29/2021   History of breast cancer 06/18/2020   Symptomatic anemia 06/16/2020   Elevated TSH 06/16/2020   Occipital neuralgia 02/14/2020   Light headed 02/02/2019   Hair loss 11/09/2018   Leg weakness, bilateral 06/29/2018   Pedal edema 06/22/2018   AICD (automatic cardioverter/defibrillator) present 06/11/2018   GERD (gastroesophageal reflux disease) 06/10/2018   Chronic back pain 05/23/2017   Chronic systolic (congestive) heart failure (HCC) 02/25/2017   Osteoporosis 06/16/2016   Routine general medical examination at a health care facility 04/24/2016   Estrogen deficiency 04/24/2016    Stroke, small vessel (HCC) 01/18/2014   Family history of hemochromatosis 01/18/2014   Nonischemic cardiomyopathy (HCC) 04/05/2013   Elevated transaminase level 05/18/2012   Spinal stenosis of lumbar region 12/10/2011   Mixed incontinence urge and stress 12/10/2011   Goiter 01/14/2011   Fatty liver 08/28/2010   Type II diabetes mellitus with renal manifestations (HCC) 07/09/2010   Hyperlipidemia associated with type 2 diabetes mellitus (HCC) 07/09/2010   Hyperkalemia 07/09/2010   Reflex sympathetic dystrophy 07/09/2010   Neuropathy 07/09/2010   Essential hypertension 07/09/2010   CARDIOMYOPATHY 07/09/2010   OVERACTIVE BLADDER 07/09/2010   Past Medical History:  Diagnosis Date   Arthritis    hands (01/13/2014)   Basal cell carcinoma 01/2014   bridge of nose   Cardiac LV ejection fraction >40%    it was 43 last year (01/13/2014)   Chronic lower back pain    CKD (chronic kidney disease), stage III (HCC)    acute on chronic stage III/notes 06/10/2018   Colon polyps    Expressive aphasia    3 times in the last week (01/13/2014)   GERD (gastroesophageal reflux disease)    Goiter past remote   treated with RI   Hyperlipidemia    Hyperpotassemia    Hypertension    Hypertonicity of bladder    Inflammatory and toxic neuropathy, unspecified    LBBB (left bundle branch block)    Migraine    stopped many years ago (01/13/2014)   Mini stroke 01/2014   Other primary cardiomyopathies    Overactive bladder    Presence of combination internal cardiac defibrillator (ICD) and pacemaker    Reflex sympathetic dystrophy, unspecified    Shingles    Shortness of breath    ambulation   Stroke (HCC)    Thyroid  disease    Type II diabetes mellitus (HCC)    Ulcer    Urine incontinence    Vertigo    hx of   Past Surgical History:  Procedure Laterality Date   BACK SURGERY     BI-VENTRICULAR PACEMAKER INSERTION (CRT-P)  10/2014  [facility]   BREAST CYST EXCISION Left 1959   CARDIAC  CATHETERIZATION  several   [place]   CATARACT EXTRACTION W/ INTRAOCULAR LENS IMPLANT Left ~ 2005   several eye injections   CATARACT EXTRACTION W/PHACO Right 05/04/2020   Procedure: CATARACT EXTRACTION PHACO AND INTRAOCULAR LENS PLACEMENT (IOC) RIGHT DIABETIC;  Surgeon: Jaye Fallow, MD;  Location: [facility] ORS;  Service: Ophthalmology;  Laterality: Right;  US  00:58.5 CDE 6.70 Fluid Pack [place] # [id number]   CHOLECYSTECTOMY N/A 06/13/2018   Procedure: LAPAROSCOPIC CHOLECYSTECTOMY;  Surgeon: Belinda Cough, MD;  Location: [facility] OR;  Service: General;  Laterality: N/A;   COLONOSCOPY  7/12   normal (hx of polyps in past)    COLONOSCOPY N/A 02/20/2024   Procedure: COLONOSCOPY;  Surgeon: Jinny Carmine, MD;  Location: [facility] ENDOSCOPY;  Service: Endoscopy;  Laterality: N/A;   CT SCAN  3/12   outside hosp- lung nodule and gallstones   ERCP N/A 06/12/2018   Procedure: ENDOSCOPIC RETROGRADE CHOLANGIOPANCREATOGRAPHY (ERCP);  Surgeon: Rollin Dover, MD;  Location: [facility] ENDOSCOPY;  Service: Endoscopy;  Laterality: N/A;   ESOPHAGOGASTRODUODENOSCOPY N/A 02/20/2024   Procedure: EGD (ESOPHAGOGASTRODUODENOSCOPY);  Surgeon: Jinny Carmine, MD;  Location: [facility] ENDOSCOPY;  Service: Endoscopy;  Laterality: N/A;   FINGER SURGERY Left 1959 & 1976   knot on index   KIDNEY DONATION Left 1989   LUMBAR LAMINECTOMY/DECOMPRESSION MICRODISCECTOMY  1999   LUMBAR LAMINECTOMY/DECOMPRESSION MICRODISCECTOMY  01/31/2012   Procedure: LUMBAR LAMINECTOMY/DECOMPRESSION MICRODISCECTOMY 2 LEVELS;  Surgeon: Alm GORMAN Molt, MD;  Location: [facility] NEURO ORS;  Service: Neurosurgery;  Laterality: N/A;  Thoracic twelve-lumbar one, lumbar one-two laminectomy    POSTERIOR LAMINECTOMY / DECOMPRESSION CERVICAL SPINE  1999   REMOVAL OF STONES  06/12/2018   Procedure: REMOVAL OF STONES;  Surgeon: Rollin Dover, MD;  Location: [facility] ENDOSCOPY;  Service: Endoscopy;;   SPHINCTEROTOMY  06/12/2018   Procedure: ANNETT;  Surgeon: Rollin Dover, MD;  Location:  [facility] ENDOSCOPY;  Service: Endoscopy;;   TUBAL LIGATION  1976   UPPER GASTROINTESTINAL ENDOSCOPY     Social History   Tobacco Use   Smoking status: Never   Smokeless tobacco: Never  Vaping Use   Vaping status: Never Used  Substance Use Topics   Alcohol use: No    Alcohol/week: 0.0 standard drinks of alcohol   Drug use: No   Family History  Problem Relation Age of Onset   Arthritis Mother    Cancer Mother        uterine and mouth   Hyperlipidemia Mother    Stroke Mother    Hypertension Mother    Alcohol abuse Father    Diabetes Father    Cancer Sister        breast   Diabetes Sister    Hyperlipidemia Sister    Heart disease Sister    Hypertension Sister    Diabetes Sister    Cancer Brother        lung cancer   Kidney disease Brother    Diabetes Brother    Hyperlipidemia Brother    Kidney failure Brother    Diabetes Daughter    Heart attack Daughter    Addison's disease Other    Allergies  Allergen Reactions   Ace Inhibitors Swelling    REACTION: tongue swelling   Lisinopril Swelling    Swelling of tongue   Insulin  Detemir Itching   Nsaids Other (See Comments)    Renal issues   Codeine Nausea Only   Hydrochlorothiazide Other (See Comments)    Dizziness/funny feeling  Tylenol  [Acetaminophen ] Other (See Comments)    Fatty liver - use with caution   Current Outpatient Medications on File Prior to Visit  Medication Sig Dispense Refill   ascorbic acid (VITAMIN C) 500 MG tablet Take 500 mg by mouth daily.     atorvastatin  (LIPITOR ) 80 MG tablet Take 1 tablet (80 mg total) by mouth daily. 90 tablet 3   carvedilol  (COREG ) 12.5 MG tablet Take 1 tablet (12.5 mg total) by mouth 2 (two) times daily with a meal. 180 tablet 1   cetirizine (ZYRTEC) 10 MG tablet Take 10 mg by mouth daily.     Continuous Glucose Sensor (FREESTYLE LIBRE 3 SENSOR) MISC USE TO CHECK GLUCOSE CONTINOUSLY. CHANGE EVERY 14 DAYS 6 each 0   dapagliflozin  propanediol (FARXIGA ) 5 MG TABS tablet Take  5 mg by mouth daily.     famotidine  (PEPCID ) 20 MG tablet Take 1 tablet by mouth once daily 90 tablet 0   gabapentin  (NEURONTIN ) 300 MG capsule TAKE 1 CAPSULE BY MOUTH THREE TIMES DAILY 270 capsule 2   hydrocortisone  2.5 % cream Apply topically 3 (three) times daily as needed. 28 g 3   insulin  degludec (TRESIBA) 100 UNIT/ML FlexTouch Pen Inject 26 Units into the skin at bedtime.     iron  polysaccharides (NIFEREX) 150 MG capsule Take 1 capsule (150 mg total) by mouth daily. 30 capsule 2   isosorbide  mononitrate (IMDUR ) 30 MG 24 hr tablet Take 1 tablet (30 mg total) by mouth daily. 90 tablet 3   LORazepam  (ATIVAN ) 0.5 MG tablet Take 1 tablet by mouth every 8 (eight) hours as needed.     nystatin cream (MYCOSTATIN) Apply 1 application topically 2 (two) times daily as needed (irritation.).      pantoprazole  (PROTONIX ) 40 MG tablet Take 1 tablet (40 mg total) by mouth 2 (two) times daily before a meal. 90 tablet 3   Respiratory Therapy Supplies (NEBULIZER/TUBING/MOUTHPIECE) KIT 1 each by Does not apply route 3 (three) times daily as needed. 1 kit 0   Rivaroxaban (XARELTO) 15 MG TABS tablet Take 15 mg by mouth daily with supper.     traMADol  (ULTRAM ) 50 MG tablet Take 1-2 tablets (50-100 mg total) by mouth every 6 (six) hours as needed for severe pain (pain score 7-10). 60 tablet 0   triamcinolone  cream (KENALOG ) 0.1 % Apply 1 Application topically 2 (two) times daily as needed. 45 g 1   TURMERIC PO Take 1,000 mg by mouth daily.     vitamin B-12 (CYANOCOBALAMIN ) 1000 MCG tablet Take 500 mcg by mouth daily.     No current facility-administered medications on file prior to visit.    Review of Systems  Constitutional:  Positive for fatigue. Negative for activity change, appetite change, fever and unexpected weight change.  HENT:  Negative for congestion, ear pain, rhinorrhea, sinus pressure and sore throat.   Eyes:  Negative for pain, redness and visual disturbance.  Respiratory:  Negative for cough,  shortness of breath and wheezing.   Cardiovascular:  Negative for chest pain and palpitations.  Gastrointestinal:  Negative for abdominal distention, abdominal pain, anal bleeding, blood in stool, constipation, diarrhea, nausea, rectal pain and vomiting.  Endocrine: Negative for polydipsia and polyuria.  Genitourinary:  Negative for dysuria, frequency and urgency.  Musculoskeletal:  Negative for arthralgias, back pain and myalgias.  Skin:  Negative for pallor and rash.  Allergic/Immunologic: Negative for environmental allergies.  Neurological:  Positive for weakness. Negative for dizziness, syncope and headaches.  Generalized weakness  Hematological:  Negative for adenopathy. Does not bruise/bleed easily.  Psychiatric/Behavioral:  Negative for decreased concentration and dysphoric mood. The patient is not nervous/anxious.        Objective:   Physical Exam Constitutional:      General: She is not in acute distress.    Appearance: Normal appearance. She is well-developed and normal weight. She is not ill-appearing or diaphoretic.  HENT:     Head: Normocephalic and atraumatic.  Eyes:     Conjunctiva/sclera: Conjunctivae normal.     Pupils: Pupils are equal, round, and reactive to light.  Neck:     Thyroid : No thyromegaly.     Vascular: No carotid bruit or JVD.  Cardiovascular:     Rate and Rhythm: Normal rate and regular rhythm.     Heart sounds: Normal heart sounds.     No gallop.  Pulmonary:     Effort: Pulmonary effort is normal. No respiratory distress.     Breath sounds: Normal breath sounds. No wheezing or rales.  Abdominal:     General: Abdomen is protuberant. Bowel sounds are normal. There is no distension or abdominal bruit.     Palpations: Abdomen is soft. There is no fluid wave, hepatomegaly, splenomegaly, mass or pulsatile mass.     Tenderness: There is no abdominal tenderness.  Musculoskeletal:     Cervical back: Normal range of motion and neck supple.      Right lower leg: No edema.     Left lower leg: No edema.  Lymphadenopathy:     Cervical: No cervical adenopathy.  Skin:    General: Skin is warm and dry.     Coloration: Skin is not pale.     Findings: No rash.  Neurological:     Mental Status: She is alert.     Coordination: Coordination normal.     Deep Tendon Reflexes: Reflexes are normal and symmetric. Reflexes normal.  Psychiatric:        Mood and Affect: Mood normal.           Assessment & Plan:   Problem List Items Addressed This Visit       Cardiovascular and Mediastinum   Angiodysplasia of colon   May be one source of her recent GI bleed Reviewed hospital records, lab results and studies in detail  This was treated with argon plasma coagulation   Currently taking iron   Will follow labs and symptoms  Back on xarelto for a fib   May refer to GI ([facility] Dr Onita) if bleeding or evidence of bleeding re occurs         Digestive   Upper GI bleed   See a/p for small bowel ulcer Reviewed hospital records, lab results and studies in detail   Stable currently       Polyp of descending colon   Small  Noted on colonoscopy  Not source of anemia       GI bleed   Angiodysplastic lesion/colon treated with APC Small SI ulcer   Reviewed hospital records, lab results and studies in detail  Now back on xarelto  Cbc today  Taking protonix  bid   For GI follow up if blood count does not stabilize       Relevant Orders   CBC with Differential/Platelet   Iron    Ferritin     Genitourinary   CKD stage 3b, GFR 30-44 ml/min (HCC)   This was stable during hosp for GI bleed   Lab today  Encouraged fluid intake along with iron        Relevant Orders   Basic metabolic panel with GFR     Other   Symptomatic anemia - Primary   Reviewed hospital records, lab results and studies in detail  From GI bleed  Admit hb 7.2, went up to 8.5 after transfusion Now on oral iron   Still tired  GI bleeding presumed stopped   Back on xarelto   Cbc /iron  today  Continue niferex 150 mg daily   Watch for re occurrence of bleeding  May need transfusion or iron  inf if oral iron  is not tolerated or effective   Call back and Er precautions noted in detail today          Relevant Orders   CBC with Differential/Platelet   Iron    Ferritin   Basic metabolic panel with GFR

## 2024-03-01 NOTE — Patient Instructions (Addendum)
 Take care of yourself  Rest as needed  Hopefully your energy level will improve as the anemia resolved  Keep taking your iron    Labs today for  Blood count and iron  and kidney function   We will make a plan when labs come back    If your symptoms worsen on change please let us  know

## 2024-03-02 ENCOUNTER — Ambulatory Visit: Payer: Self-pay | Admitting: Family Medicine

## 2024-03-02 DIAGNOSIS — D649 Anemia, unspecified: Secondary | ICD-10-CM

## 2024-03-02 LAB — FERRITIN: Ferritin: 48.9 ng/mL (ref 10.0–291.0)

## 2024-03-08 ENCOUNTER — Encounter: Payer: Self-pay | Admitting: Pharmacist

## 2024-03-11 ENCOUNTER — Other Ambulatory Visit: Payer: Self-pay | Admitting: Cardiovascular Disease

## 2024-03-16 ENCOUNTER — Telehealth: Payer: Self-pay | Admitting: Medical

## 2024-03-16 DIAGNOSIS — E1122 Type 2 diabetes mellitus with diabetic chronic kidney disease: Secondary | ICD-10-CM | POA: Diagnosis not present

## 2024-03-16 DIAGNOSIS — E1169 Type 2 diabetes mellitus with other specified complication: Secondary | ICD-10-CM | POA: Diagnosis not present

## 2024-03-16 DIAGNOSIS — Z794 Long term (current) use of insulin: Secondary | ICD-10-CM | POA: Diagnosis not present

## 2024-03-16 DIAGNOSIS — E785 Hyperlipidemia, unspecified: Secondary | ICD-10-CM | POA: Diagnosis not present

## 2024-03-16 DIAGNOSIS — E1165 Type 2 diabetes mellitus with hyperglycemia: Secondary | ICD-10-CM | POA: Diagnosis not present

## 2024-03-16 DIAGNOSIS — I10 Essential (primary) hypertension: Secondary | ICD-10-CM | POA: Diagnosis not present

## 2024-03-16 DIAGNOSIS — E1129 Type 2 diabetes mellitus with other diabetic kidney complication: Secondary | ICD-10-CM | POA: Diagnosis not present

## 2024-03-16 DIAGNOSIS — R809 Proteinuria, unspecified: Secondary | ICD-10-CM | POA: Diagnosis not present

## 2024-03-16 DIAGNOSIS — N184 Chronic kidney disease, stage 4 (severe): Secondary | ICD-10-CM | POA: Diagnosis not present

## 2024-03-16 NOTE — Telephone Encounter (Signed)
 Patient had hospital admission in mid September for anemia requiring blood transfusions. Lately patient has been experiencing lower extremity swelling. She noticed during a camping trip a week or so ago that she had trouble putting her shoes on because they were so tight. Ask patient about elevation, she states she sits with recline a lot of the time while at home. Asked patient about salt and fluid intake, patient denies a lot of salt intake but has trouble maintaining adequate fluid intake due to overactive bladder.   Patient request appointment to be seen. Appt made for patient. No further questions at this time.

## 2024-03-16 NOTE — Telephone Encounter (Signed)
 Pt c/o swelling/edema: STAT if pt has developed SOB within 24 hours  If swelling, where is the swelling located? Feet   How much weight have you gained and in what time span? No   Have you gained 2 pounds in a day or 5 pounds in a week? No   Do you have a log of your daily weights (if so, list)? Has not logged   Are you currently taking a fluid pill? No   Are you currently SOB? No   Have you traveled recently in a car or plane for an extended period of time? No

## 2024-03-17 ENCOUNTER — Other Ambulatory Visit (INDEPENDENT_AMBULATORY_CARE_PROVIDER_SITE_OTHER)

## 2024-03-17 ENCOUNTER — Telehealth: Payer: Self-pay | Admitting: Cardiovascular Disease

## 2024-03-17 DIAGNOSIS — D649 Anemia, unspecified: Secondary | ICD-10-CM

## 2024-03-17 NOTE — Telephone Encounter (Signed)
 Pt's medication was sent to pt's pharmacy as requested. Confirmation received.

## 2024-03-17 NOTE — Telephone Encounter (Signed)
 Pt is calling to f/u on refill. Pt is out of medication. Please advise.

## 2024-03-18 ENCOUNTER — Ambulatory Visit: Payer: Self-pay | Admitting: Family Medicine

## 2024-03-18 DIAGNOSIS — D649 Anemia, unspecified: Secondary | ICD-10-CM

## 2024-03-18 LAB — CBC WITH DIFFERENTIAL/PLATELET
Basophils Absolute: 0 K/uL (ref 0.0–0.1)
Basophils Relative: 0.7 % (ref 0.0–3.0)
Eosinophils Absolute: 0.3 K/uL (ref 0.0–0.7)
Eosinophils Relative: 4.3 % (ref 0.0–5.0)
HCT: 33.7 % — ABNORMAL LOW (ref 36.0–46.0)
Hemoglobin: 10.8 g/dL — ABNORMAL LOW (ref 12.0–15.0)
Lymphocytes Relative: 26.1 % (ref 12.0–46.0)
Lymphs Abs: 1.6 K/uL (ref 0.7–4.0)
MCHC: 32.1 g/dL (ref 30.0–36.0)
MCV: 88.6 fl (ref 78.0–100.0)
Monocytes Absolute: 0.4 K/uL (ref 0.1–1.0)
Monocytes Relative: 6.7 % (ref 3.0–12.0)
Neutro Abs: 3.7 K/uL (ref 1.4–7.7)
Neutrophils Relative %: 62.2 % (ref 43.0–77.0)
Platelets: 165 K/uL (ref 150.0–400.0)
RBC: 3.8 Mil/uL — ABNORMAL LOW (ref 3.87–5.11)
RDW: 19.8 % — ABNORMAL HIGH (ref 11.5–15.5)
WBC: 6 K/uL (ref 4.0–10.5)

## 2024-03-18 LAB — FERRITIN: Ferritin: 22 ng/mL (ref 10.0–291.0)

## 2024-03-18 LAB — IRON: Iron: 226 ug/dL — ABNORMAL HIGH (ref 42–145)

## 2024-03-22 ENCOUNTER — Encounter: Payer: Self-pay | Admitting: Medical

## 2024-03-22 ENCOUNTER — Ambulatory Visit: Attending: Medical | Admitting: Medical

## 2024-03-22 VITALS — BP 140/60 | HR 60 | Ht 60.0 in | Wt 151.6 lb

## 2024-03-22 DIAGNOSIS — E1122 Type 2 diabetes mellitus with diabetic chronic kidney disease: Secondary | ICD-10-CM

## 2024-03-22 DIAGNOSIS — I10 Essential (primary) hypertension: Secondary | ICD-10-CM

## 2024-03-22 DIAGNOSIS — N1831 Chronic kidney disease, stage 3a: Secondary | ICD-10-CM | POA: Diagnosis not present

## 2024-03-22 DIAGNOSIS — N183 Chronic kidney disease, stage 3 unspecified: Secondary | ICD-10-CM | POA: Diagnosis not present

## 2024-03-22 DIAGNOSIS — D508 Other iron deficiency anemias: Secondary | ICD-10-CM | POA: Diagnosis not present

## 2024-03-22 DIAGNOSIS — I5020 Unspecified systolic (congestive) heart failure: Secondary | ICD-10-CM

## 2024-03-22 DIAGNOSIS — K922 Gastrointestinal hemorrhage, unspecified: Secondary | ICD-10-CM

## 2024-03-22 DIAGNOSIS — I428 Other cardiomyopathies: Secondary | ICD-10-CM

## 2024-03-22 MED ORDER — FUROSEMIDE 20 MG PO TABS: 20.0000 mg | ORAL_TABLET | Freq: Every day | ORAL | 1 refills | Status: DC

## 2024-03-22 NOTE — Patient Instructions (Signed)
 Medication Instructions:  Your physician recommends the following medication changes.  START TAKING: Lasix  20 mg by mouth daily   *If you need a refill on your cardiac medications before your next appointment, please call your pharmacy*  Lab Work: No labs ordered today    Testing/Procedures: No test ordered today   Follow-Up: At [facility], you and your health needs are our priority.  As part of our continuing mission to provide you with exceptional heart care, our providers are all part of one team.  This team includes your primary Cardiologist (physician) and Advanced Practice Providers or APPs (Physician Assistants and Nurse Practitioners) who all work together to provide you with the care you need, when you need it.  Your next appointment:   04/26/24 @ 9:40 am  Provider:   Timothy Gollan, MD

## 2024-03-22 NOTE — Progress Notes (Signed)
 Cardiology Office Note   Date:  03/22/2024  ID:  Machele, Deihl [birth year], MRN [id number] PCP: Randeen Laine LABOR, MD  [facility] Providers Cardiologist:  None    History of Present Illness Morgan Hubbard is a 89 y.o. 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   Patient has follows with [facility] for EP/device management.  She sees Dr. Gollan once a year.    Echo in 2009 showed LVEF 30% Cath in 2012 showed no CAD.  Ejection fraction  15-20% in 2015 S/p CRT-D May 2016 Up to 35% in August 2016 Up to 40% in 2017 Up to 55% in 06/2017 40 to 45% in 06/2018 45 to 50% in June 2023  The patient was last seen 02/17/24 reporting hypotension. PCP previously decreased hydralazine .  Blood pressure was 91/52 and hydralazine  was stopped and Imdur  was decreased.  Echocardiogram was ordered, but not completed. Labs showed Hgb 7.4 and she was told to go to the ER. Patient was subsequently admitted and underwent transfusion x 1 unit, EGD/colonoscopy s/p APC. Hgb improved to 8.5 and Xarelto was resumed.   Today, she feels she is still feeling weak and low energy. She reports lower leg edema that seems to be improving. She keeps them elevated. She does not take lasix . She feels breathing is the same. She is taking Xarelto daily. No bleeding issues noted.   Studies Reviewed EKG Interpretation Date/Time:  Monday March 22 2024 11:18:44 EDT Ventricular Rate:  60 PR Interval:  152 QRS Duration:  116 QT Interval:  468 QTC Calculation: 468 R Axis:   -54  Text Interpretation: AV dual-paced rhythm with occasional ventricular-paced complexes When compared with ECG of 19-Feb-2024 04:45, Vent. rate has  decreased BY   9 BPM Confirmed by Franchester, Sumiye Hirth ([id number]) on 03/22/2024 11:22:00 AM    Echo 2023  1. Abnormal septal motion . Left ventricular ejection fraction, by  estimation, is 45 to 50%. The left ventricle has mildly decreased  function. The left ventricle has no regional wall motion abnormalities.  Left ventricular diastolic parameters are  consistent with Grade I diastolic dysfunction (impaired relaxation).  Elevated left ventricular end-diastolic pressure.   2. Pacer wires in RA/RV. Right ventricular systolic function is normal.  The right ventricular size is normal. There is mildly elevated pulmonary  artery systolic pressure.   3. The mitral valve is abnormal. Trivial mitral valve regurgitation. No  evidence of mitral stenosis. Moderate mitral annular calcification.   4. Tricuspid valve regurgitation is mild to moderate.   5. The aortic valve is tricuspid. There is mild calcification of the  aortic valve. Aortic valve regurgitation is trivial. Aortic valve  sclerosis is present, with no evidence of aortic valve stenosis.   6. The inferior vena cava is normal in size with greater than 50%  respiratory variability, suggesting right atrial pressure of 3 mmHg.    Echo 06/2018 Study Conclusions   - Left ventricle: The cavity size was normal. Systolic function was    mildly to moderately reduced. The estimated ejection fraction was    in the range of 40% to 45%. Diffuse hypokinesis. Features are    consistent with a pseudonormal left ventricular filling pattern,    with concomitant  abnormal relaxation and increased filling    pressure (grade 2 diastolic dysfunction).  - Ventricular septum: Septal motion showed abnormal function,    dyssynergy, and paradox. These changes are consistent with    intraventricular conduction delay.  - Mitral valve: There was mild to moderate regurgitation directed    centrally.  - Right ventricle: Systolic function was mildly reduced.  - Pulmonary  arteries: Systolic pressure was moderately increased.    PA peak pressure: 54 mm Hg (S).  - Pericardium, extracardiac: A trivial pericardial effusion was    identified posterior to the heart. There was a left pleural    effusion.       Physical Exam VS:  BP (!) 140/60 (BP Location: Right Arm, Patient Position: Sitting, Cuff Size: Normal)   Pulse 60 Comment: 65 oximeter  Ht 5' (1.524 m)   Wt 151 lb 9.6 oz (68.8 kg)   LMP 06/03/1992   SpO2 92%   BMI 29.61 kg/m        Wt Readings from Last 3 Encounters:  03/22/24 151 lb 9.6 oz (68.8 kg)  03/01/24 146 lb 8 oz (66.5 kg)  02/18/24 146 lb (66.2 kg)    GEN: Well nourished, well developed in no acute distress NECK: No JVD; No carotid bruits CARDIAC: RRR, no murmurs, rubs, gallops RESPIRATORY:  Clear to auscultation without rales, wheezing or rhonchi  ABDOMEN: Soft, non-tender, non-distended EXTREMITIES:  No edema; No deformity   ASSESSMENT AND PLAN  Anemia/GIB Patient was admitted last month for Anemia/GIB with Hgb down to 7.4 given PRBCs. EGD/colonoscopy showed angiodysplastic lesion/colon treated with  APC. She is taking iron  pills, PPI, and is back on Xarelto. Most recent Hgb 10.8.  She denies bleeding issues. This is being monitor by PCP. She reports energy is still low and still feels SOB.   HTN BP is mildly elevated today. Continue Coreg  12.5mg BID and Imdur  30mg  daily.  Hypercholesterolemia Continue Lipitor  80mg  daily  NICM HFimpEF S/p CRT-D She reports lower leg edema over the weekend that is improving. She has trace lower leg edema on the left side. She eats low salt diet and elevates her feet. Echo scheduled for next month. Last echo showed LVEF 45-50%. She also reports SOB, which may be multifactorial. I will send in lasix  20mg  to use as needed. She takes Farxiga  5mg  daily. She has overactive bladder and does not want to take too much diuretic if it's not required.   CKD stage 3 Most recent Scr 1.58.   DM2 A1C 8.2.  continue follow-up by PCP.   H/o stroke Paroxysmal Afib She is followed by EP at [facility]. Continue Coreg  12.5mg BID and Xarelto 15mg  daily. Given GIB may be candidate for Watchman, will defer to EP At [facility].         Dispo: Follow-up in 1 month  Signed, Tiffony Kite VEAR Fishman, PA-C

## 2024-03-24 DIAGNOSIS — H6063 Unspecified chronic otitis externa, bilateral: Secondary | ICD-10-CM | POA: Diagnosis not present

## 2024-03-24 DIAGNOSIS — H6123 Impacted cerumen, bilateral: Secondary | ICD-10-CM | POA: Diagnosis not present

## 2024-03-31 ENCOUNTER — Other Ambulatory Visit (INDEPENDENT_AMBULATORY_CARE_PROVIDER_SITE_OTHER)

## 2024-03-31 DIAGNOSIS — D649 Anemia, unspecified: Secondary | ICD-10-CM

## 2024-04-01 ENCOUNTER — Ambulatory Visit: Payer: Self-pay | Admitting: Family Medicine

## 2024-04-01 DIAGNOSIS — D649 Anemia, unspecified: Secondary | ICD-10-CM

## 2024-04-01 LAB — CBC WITH DIFFERENTIAL/PLATELET
Basophils Absolute: 0 K/uL (ref 0.0–0.1)
Basophils Relative: 0.9 % (ref 0.0–3.0)
Eosinophils Absolute: 0.3 K/uL (ref 0.0–0.7)
Eosinophils Relative: 6 % — ABNORMAL HIGH (ref 0.0–5.0)
HCT: 37.7 % (ref 36.0–46.0)
Hemoglobin: 12 g/dL (ref 12.0–15.0)
Lymphocytes Relative: 33 % (ref 12.0–46.0)
Lymphs Abs: 1.9 K/uL (ref 0.7–4.0)
MCHC: 31.9 g/dL (ref 30.0–36.0)
MCV: 88.8 fl (ref 78.0–100.0)
Monocytes Absolute: 0.5 K/uL (ref 0.1–1.0)
Monocytes Relative: 9.3 % (ref 3.0–12.0)
Neutro Abs: 2.9 K/uL (ref 1.4–7.7)
Neutrophils Relative %: 50.8 % (ref 43.0–77.0)
Platelets: 219 K/uL (ref 150.0–400.0)
RBC: 4.25 Mil/uL (ref 3.87–5.11)
RDW: 18.6 % — ABNORMAL HIGH (ref 11.5–15.5)
WBC: 5.7 K/uL (ref 4.0–10.5)

## 2024-04-01 LAB — FERRITIN: Ferritin: 18.3 ng/mL (ref 10.0–291.0)

## 2024-04-01 LAB — IRON: Iron: 53 ug/dL (ref 42–145)

## 2024-04-03 ENCOUNTER — Other Ambulatory Visit: Payer: Self-pay | Admitting: Family Medicine

## 2024-04-05 ENCOUNTER — Ambulatory Visit: Attending: Medical

## 2024-04-05 DIAGNOSIS — I428 Other cardiomyopathies: Secondary | ICD-10-CM | POA: Diagnosis not present

## 2024-04-05 LAB — ECHOCARDIOGRAM COMPLETE
AR max vel: 1.5 cm2
AV Area VTI: 1.7 cm2
AV Area mean vel: 1.47 cm2
AV Mean grad: 6 mmHg
AV Peak grad: 11.2 mmHg
Ao pk vel: 1.67 m/s
Area-P 1/2: 3.21 cm2
S' Lateral: 2.9 cm

## 2024-04-06 NOTE — Telephone Encounter (Signed)
 Name of Medication: Tramadol  50mg  Name of Pharmacy: [facility] [address]  Last Fill or Written Date and Quantity: 01/01/24 #60 tab/ 0 refill Last Office Visit and Type: 03/01/24 Hospital f/u Next Office Visit and Type: n/a

## 2024-04-14 DIAGNOSIS — R2689 Other abnormalities of gait and mobility: Secondary | ICD-10-CM | POA: Diagnosis not present

## 2024-04-14 DIAGNOSIS — M6281 Muscle weakness (generalized): Secondary | ICD-10-CM | POA: Diagnosis not present

## 2024-04-14 DIAGNOSIS — H8193 Unspecified disorder of vestibular function, bilateral: Secondary | ICD-10-CM | POA: Diagnosis not present

## 2024-04-14 DIAGNOSIS — M461 Sacroiliitis, not elsewhere classified: Secondary | ICD-10-CM | POA: Diagnosis not present

## 2024-04-25 NOTE — Progress Notes (Unsigned)
 Cardiology Office Note  Date:  04/26/2024   ID:  Morgan Hubbard, Morgan Hubbard 31-May-1935, MRN [id number]  PCP:  Randeen Laine LABOR, MD   Chief Complaint  Patient presents with   1-2 month follow up     Discuss Echo results. Patient c/o shortness of breath at times.     HPI:  Morgan Hubbard is a very pleasant 89 year old woman with past medical history of  nonischemic cardiomyopathy,   Biventricular ICD placed May 2016  Cath 2012 with no CAD ejection fraction of 30% in 2009  left bundle branch block,  TIA/stroke August 2015.  had difficulty speaking,  Walking nephrectomy/donated kidney in 1989,  possible angioedema on lisinopril,  back surgery x3,  minimal carotid b/l in 2015, carotid bruit on the right poorly controlled diabetes   Ejection fraction 45 to 50% in June 2023 up from 40 to 45% in January 2020 Normal ejection fraction November 2025 She presents for routine followup  Of her cardiomyopathy, EF recovered to normal, paroxysmal atrial fibrillation seen by [facility] EP  Last seen in clinic by myself 9/24 Seen by one of our providers March 22, 2024 Felt weak, low energy Was not taking Lasix , lower extremity edema minimal, stable  In follow-up today reports she continues to feel weak No regular walking program, walks with a cane Presents today in a wheelchair Denies leg swelling, no PND orthopnea, no chest pain concerning for angina  Echo April 05, 2024 EF 55 to 60% Grade 1 diastolic dysfunction No significant valvular heart disease  Labs reviewed: HGB 12 up from 8.5  EP at [facility], last seen January 01, 2023 Told she has afib  on eliquis 2.5 twice daily, changed to xarelto 15 mg daily  Not on lasix , overactive bladder  Lab work reviewed Labs: CR 1.6  Other past medical history reviewed History of acute on chronic renal failure and for poorly controlled hypertension Admitted to the hospital at [facility] November 19, 2021 shortness of breath, chest pain, hypertension Poorly controlled  hypertension, systolic pressures greater than 200 Discharged on Imdur  60 hydralazine  50 twice daily losartan  HCT 100/12.5 daily Coreg  12.5 twice daily, Lasix  10 daily On second trip to the hospital losartan  HCTZ was held secondary to renal dysfunction  Admission to [facility]: Metformin  on hold, off ARB  Sleeps late in the am, reports that she wakes up at 11 and typically goes to bed between 11 PM and 1 AM  Currently taking carvedilol  12.5 twice daily, hydralazine  50 twice daily, Imdur  60 twice daily  Sedentary, no regular exercise , chronic pain, leg weakness  Ejection fraction of 15-20% in 2015,  Up to 35% in August 2016,  Up to 40% in 2017 Up to 55% in 06/2017 40 to 45% in 06/2018 45 to 50% in June 2023  MRI results reviewed and discussed in detail MRI brain/MRA head w/o contrast 03/08/19: 1. No acute intracranial process. Left frontal lobe encephalomalacia, likely due to prior ischemia.  2. Unchanged high grade narrowing of the left MCA distal M1 segment.  Lab Results  Component Value Date   CHOL 113 03/26/2023   HDL 33.00 (L) 03/26/2023   LDLCALC 48 03/26/2023   TRIG 161.0 (H) 03/26/2023     Other past medical history reviewed TIA/stroke August 2015.  had difficulty speaking,  Walking  Initially placed on aspirin , Plavix , been changed by neurology to aspirin  325 mg daily , Plavix  held  She continues to follow-up at [facility]   with cardiology and EP  Biventricular ICD  placed May 2016 for shortness of breath symptoms placed by  Dr. Robinette     History ofsevere back disease she has significant neuropathy in her legs.    Echocardiogram was done for her fatigue and shortness of breath.  ejection fraction of 25-30% which was significant drop from prior ejection fraction February 2013 (45%) done at [facility].  normal RVSP.   Prior cardiac catheterization 08/30/2010 in [place]  showed no significant coronary artery disease  PMH:   has a past medical history of Arthritis,  Basal cell carcinoma (01/2014), Cardiac LV ejection fraction >40%, Chronic lower back pain, CKD (chronic kidney disease), stage III (HCC), Colon polyps, Expressive aphasia, GERD (gastroesophageal reflux disease), Goiter (past remote), Hyperlipidemia, Hyperpotassemia, Hypertension, Hypertonicity of bladder, Inflammatory and toxic neuropathy, unspecified, LBBB (left bundle branch block), Migraine, Mini stroke (01/2014), Other primary cardiomyopathies, Overactive bladder, Presence of combination internal cardiac defibrillator (ICD) and pacemaker, Reflex sympathetic dystrophy, unspecified, Shingles, Shortness of breath, Stroke (HCC), Thyroid  disease, Type II diabetes mellitus (HCC), Ulcer, Urine incontinence, and Vertigo.  PSH:    Past Surgical History:  Procedure Laterality Date   BACK SURGERY     BI-VENTRICULAR PACEMAKER INSERTION (CRT-P)  10/2014   [facility]   BREAST CYST EXCISION Left 1959   CARDIAC CATHETERIZATION  several   [place]   CATARACT EXTRACTION W/ INTRAOCULAR LENS IMPLANT Left ~ 2005   several eye injections   CATARACT EXTRACTION W/PHACO Right 05/04/2020   Procedure: CATARACT EXTRACTION PHACO AND INTRAOCULAR LENS PLACEMENT (IOC) RIGHT DIABETIC;  Surgeon: Jaye Fallow, MD;  Location: [facility] ORS;  Service: Ophthalmology;  Laterality: Right;  US  00:58.5 CDE 6.70 Fluid Pack [place] # [id number]   CHOLECYSTECTOMY N/A 06/13/2018   Procedure: LAPAROSCOPIC CHOLECYSTECTOMY;  Surgeon: Belinda Cough, MD;  Location: [facility] OR;  Service: General;  Laterality: N/A;   COLONOSCOPY  7/12   normal (hx of polyps in past)    COLONOSCOPY N/A 02/20/2024   Procedure: COLONOSCOPY;  Surgeon: Jinny Carmine, MD;  Location: [facility] ENDOSCOPY;  Service: Endoscopy;  Laterality: N/A;   CT SCAN  3/12   outside hosp- lung nodule and gallstones   ERCP N/A 06/12/2018   Procedure: ENDOSCOPIC RETROGRADE CHOLANGIOPANCREATOGRAPHY (ERCP);  Surgeon: Rollin Dover, MD;  Location: [facility] ENDOSCOPY;  Service: Endoscopy;  Laterality: N/A;    ESOPHAGOGASTRODUODENOSCOPY N/A 02/20/2024   Procedure: EGD (ESOPHAGOGASTRODUODENOSCOPY);  Surgeon: Jinny Carmine, MD;  Location: [facility] ENDOSCOPY;  Service: Endoscopy;  Laterality: N/A;   FINGER SURGERY Left 1959 & 1976   knot on index   KIDNEY DONATION Left 1989   LUMBAR LAMINECTOMY/DECOMPRESSION MICRODISCECTOMY  1999   LUMBAR LAMINECTOMY/DECOMPRESSION MICRODISCECTOMY  01/31/2012   Procedure: LUMBAR LAMINECTOMY/DECOMPRESSION MICRODISCECTOMY 2 LEVELS;  Surgeon: Alm GORMAN Molt, MD;  Location: [facility] NEURO ORS;  Service: Neurosurgery;  Laterality: N/A;  Thoracic twelve-lumbar one, lumbar one-two laminectomy    POSTERIOR LAMINECTOMY / DECOMPRESSION CERVICAL SPINE  1999   REMOVAL OF STONES  06/12/2018   Procedure: REMOVAL OF STONES;  Surgeon: Rollin Dover, MD;  Location: [facility] ENDOSCOPY;  Service: Endoscopy;;   SPHINCTEROTOMY  06/12/2018   Procedure: ANNETT;  Surgeon: Rollin Dover, MD;  Location: [facility] ENDOSCOPY;  Service: Endoscopy;;   TUBAL LIGATION  1976   UPPER GASTROINTESTINAL ENDOSCOPY      Current Outpatient Medications  Medication Sig Dispense Refill   amLODipine  (NORVASC ) 2.5 MG tablet Take 2.5 mg by mouth at bedtime.     ascorbic acid (VITAMIN C) 500 MG tablet Take 500 mg by mouth daily.     atorvastatin  (LIPITOR ) 80 MG tablet  Take 1 tablet by mouth once daily 90 tablet 3   carvedilol  (COREG ) 12.5 MG tablet TAKE 1 TABLET BY MOUTH TWICE DAILY WITH A MEAL 180 tablet 3   cetirizine (ZYRTEC) 10 MG tablet Take 10 mg by mouth daily.     Continuous Glucose Sensor (FREESTYLE LIBRE 3 SENSOR) MISC USE TO CHECK GLUCOSE CONTINOUSLY. CHANGE EVERY 14 DAYS 6 each 0   dapagliflozin  propanediol (FARXIGA ) 5 MG TABS tablet Take 5 mg by mouth daily.     famotidine  (PEPCID ) 20 MG tablet Take 1 tablet by mouth once daily 90 tablet 0   gabapentin  (NEURONTIN ) 300 MG capsule TAKE 1 CAPSULE BY MOUTH THREE TIMES DAILY 270 capsule 2   hydrocortisone  2.5 % cream Apply topically 3 (three) times daily as needed. 28 g  3   insulin  degludec (TRESIBA) 100 UNIT/ML FlexTouch Pen Inject 26 Units into the skin at bedtime.     isosorbide  mononitrate (IMDUR ) 30 MG 24 hr tablet Take 1 tablet (30 mg total) by mouth daily. 90 tablet 3   LORazepam  (ATIVAN ) 0.5 MG tablet Take 1 tablet by mouth every 8 (eight) hours as needed.     nystatin cream (MYCOSTATIN) Apply 1 application topically 2 (two) times daily as needed (irritation.).      pantoprazole  (PROTONIX ) 40 MG tablet Take 1 tablet (40 mg total) by mouth 2 (two) times daily before a meal. 90 tablet 3   Rivaroxaban (XARELTO) 15 MG TABS tablet Take 15 mg by mouth daily with supper.     traMADol  (ULTRAM ) 50 MG tablet Take 1-2 tablets (50-100 mg total) by mouth every 6 (six) hours as needed for severe pain (pain score 7-10). 60 tablet 0   triamcinolone  cream (KENALOG ) 0.1 % Apply 1 Application topically 2 (two) times daily as needed. 45 g 1   TRULICITY 1.5 MG/0.5ML SOAJ Inject into the skin once a week.     TURMERIC PO Take 1,000 mg by mouth daily.     vitamin B-12 (CYANOCOBALAMIN ) 1000 MCG tablet Take 500 mcg by mouth daily.     furosemide  (LASIX ) 20 MG tablet Take 1 tablet (20 mg total) by mouth daily as needed.     iron  polysaccharides (NIFEREX) 150 MG capsule Take 1 capsule (150 mg total) by mouth daily. (Patient not taking: Reported on 04/26/2024) 30 capsule 2   Respiratory Therapy Supplies (NEBULIZER/TUBING/MOUTHPIECE) KIT 1 each by Does not apply route 3 (three) times daily as needed. (Patient not taking: Reported on 04/26/2024) 1 kit 0   No current facility-administered medications for this visit.     Allergies:   Ace inhibitors, Lisinopril, Insulin  detemir, Nsaids, Codeine, Hydrochlorothiazide, and Tylenol  [acetaminophen ]   Social History:  The patient  reports that she has never smoked. She has never used smokeless tobacco. She reports that she does not drink alcohol and does not use drugs.   Family History:   family history includes Addison's disease in an  other family member; Alcohol abuse in her father; Arthritis in her mother; Cancer in her brother, mother, and sister; Diabetes in her brother, daughter, father, sister, and sister; Heart attack in her daughter; Heart disease in her sister; Hyperlipidemia in her brother, mother, and sister; Hypertension in her mother and sister; Kidney disease in her brother; Kidney failure in her brother; Stroke in her mother.    Review of Systems: Review of Systems  HENT: Negative.    Respiratory: Negative.    Cardiovascular: Negative.   Gastrointestinal: Negative.   Musculoskeletal: Negative.  Gait instability  Neurological:  Positive for weakness.  Psychiatric/Behavioral: Negative.    All other systems reviewed and are negative.   PHYSICAL EXAM: VS:  BP (!) 112/53 (BP Location: Left Arm, Patient Position: Sitting, Cuff Size: Normal)   Pulse 71   Ht 5' (1.524 m)   Wt 145 lb 6 oz (65.9 kg)   LMP 06/03/1992   SpO2 95%   BMI 28.39 kg/m  , BMI Body mass index is 28.39 kg/m. Constitutional:  oriented to person, place, and time. No distress.  HENT:  Head: Normocephalic and atraumatic.  Eyes:  no discharge. No scleral icterus.  Neck: Normal range of motion. Neck supple. No JVD present.  Cardiovascular: Normal rate, regular rhythm, normal heart sounds and intact distal pulses. Exam reveals no gallop and no friction rub. No edema No murmur heard. Pulmonary/Chest: Effort normal and breath sounds normal. No stridor. No respiratory distress.  no wheezes.  no rales.  no tenderness.  Abdominal: Soft.  no distension.  no tenderness.  Musculoskeletal: Normal range of motion.  no  tenderness or deformity.  Neurological:  normal muscle tone. Coordination normal. No atrophy Skin: Skin is warm and dry. No rash noted. not diaphoretic.  Psychiatric:  normal mood and affect. behavior is normal. Thought content normal.   Recent Labs: 02/18/2024: ALT 13 03/01/2024: BUN 19; Creatinine, Ser 1.58; Potassium 4.8;  Sodium 141 03/31/2024: Hemoglobin 12.0; Platelets 219.0    Lipid Panel Lab Results  Component Value Date   CHOL 113 03/26/2023   HDL 33.00 (L) 03/26/2023   LDLCALC 48 03/26/2023   TRIG 161.0 (H) 03/26/2023     Wt Readings from Last 3 Encounters:  04/26/24 145 lb 6 oz (65.9 kg)  03/22/24 151 lb 9.6 oz (68.8 kg)  03/01/24 146 lb 8 oz (66.5 kg)    ASSESSMENT AND PLAN:  HYPERCHOLESTEROLEMIA Cholesterol at goal no changes made  Essential hypertension Blood pressure low, recommend she hydrate, monitor blood pressure at home, slow trend down on the weight  Nonischemic cardiomyopathy (HCC) Dating back 16 years, ejection fraction has now normalized No medication changes made  Cerebral thrombosis with cerebral infarction (HCC) Off aspirin  Plavix  on Xarelto 15 daily for atrial fibrillation by her report, evaluated by EP at [facility] Cholesterol at goal  Acute on chronic renal failure History of poorly controlled hypertension,  A1c 8.2 up from 7.5  Type 2 diabetes mellitus without complication, unspecified long term insulin  use status (HCC) We have encouraged continued exercise, careful diet management   Falls  deconditioning, unable to exercise Gait instability,  Uses a cane   No orders of the defined types were placed in this encounter.    Signed, Velinda Lunger, M.D., Ph.D. 04/26/2024  [facility] [place], [place] [phone]

## 2024-04-26 ENCOUNTER — Ambulatory Visit

## 2024-04-26 ENCOUNTER — Encounter: Payer: Self-pay | Admitting: Cardiovascular Disease

## 2024-04-26 ENCOUNTER — Ambulatory Visit: Attending: Cardiovascular Disease | Admitting: Cardiovascular Disease

## 2024-04-26 ENCOUNTER — Ambulatory Visit: Admitting: Medical

## 2024-04-26 VITALS — BP 112/53 | HR 71 | Ht 60.0 in | Wt 145.4 lb

## 2024-04-26 DIAGNOSIS — I5020 Unspecified systolic (congestive) heart failure: Secondary | ICD-10-CM

## 2024-04-26 DIAGNOSIS — E7800 Pure hypercholesterolemia, unspecified: Secondary | ICD-10-CM | POA: Diagnosis not present

## 2024-04-26 DIAGNOSIS — E1122 Type 2 diabetes mellitus with diabetic chronic kidney disease: Secondary | ICD-10-CM | POA: Diagnosis not present

## 2024-04-26 DIAGNOSIS — N183 Chronic kidney disease, stage 3 unspecified: Secondary | ICD-10-CM | POA: Diagnosis not present

## 2024-04-26 DIAGNOSIS — N1831 Chronic kidney disease, stage 3a: Secondary | ICD-10-CM

## 2024-04-26 DIAGNOSIS — I5022 Chronic systolic (congestive) heart failure: Secondary | ICD-10-CM

## 2024-04-26 DIAGNOSIS — I10 Essential (primary) hypertension: Secondary | ICD-10-CM

## 2024-04-26 DIAGNOSIS — I428 Other cardiomyopathies: Secondary | ICD-10-CM | POA: Diagnosis not present

## 2024-04-26 NOTE — Patient Instructions (Signed)

## 2024-04-27 ENCOUNTER — Ambulatory Visit

## 2024-04-27 VITALS — BP 102/60 | Ht 61.0 in | Wt 144.4 lb

## 2024-04-27 DIAGNOSIS — Z0000 Encounter for general adult medical examination without abnormal findings: Secondary | ICD-10-CM

## 2024-04-27 NOTE — Progress Notes (Signed)
 Chief Complaint  Patient presents with   Medicare Wellness     Subjective:   Morgan Hubbard is a 89 y.o. female who presents for a Medicare Annual Wellness Visit.  Allergies (verified) Ace inhibitors, Lisinopril, Insulin  detemir, Nsaids, Codeine, Hydrochlorothiazide, and Tylenol  [acetaminophen ]   History: Past Medical History:  Diagnosis Date   Arthritis    hands (01/13/2014)   Basal cell carcinoma 01/2014   bridge of nose   Cardiac LV ejection fraction >40%    it was 43 last year (01/13/2014)   Chronic lower back pain    CKD (chronic kidney disease), stage III (HCC)    acute on chronic stage III/notes 06/10/2018   Colon polyps    Expressive aphasia    3 times in the last week (01/13/2014)   GERD (gastroesophageal reflux disease)    Goiter past remote   treated with RI   Hyperlipidemia    Hyperpotassemia    Hypertension    Hypertonicity of bladder    Inflammatory and toxic neuropathy, unspecified    LBBB (left bundle branch block)    Migraine    stopped many years ago (01/13/2014)   Mini stroke 01/2014   Other primary cardiomyopathies    Overactive bladder    Presence of combination internal cardiac defibrillator (ICD) and pacemaker    Reflex sympathetic dystrophy, unspecified    Shingles    Shortness of breath    ambulation   Stroke (HCC)    Thyroid  disease    Type II diabetes mellitus (HCC)    Ulcer    Urine incontinence    Vertigo    hx of   Past Surgical History:  Procedure Laterality Date   BACK SURGERY     BI-VENTRICULAR PACEMAKER INSERTION (CRT-P)  10/2014   [facility]   BREAST CYST EXCISION Left 1959   CARDIAC CATHETERIZATION  several   [place]   CATARACT EXTRACTION W/ INTRAOCULAR LENS IMPLANT Left ~ 2005   several eye injections   CATARACT EXTRACTION W/PHACO Right 05/04/2020   Procedure: CATARACT EXTRACTION PHACO AND INTRAOCULAR LENS PLACEMENT (IOC) RIGHT DIABETIC;  Surgeon: Jaye Fallow, MD;  Location: [facility] ORS;  Service: Ophthalmology;   Laterality: Right;  US  00:58.5 CDE 6.70 Fluid Pack [place] # [id number]   CHOLECYSTECTOMY N/A 06/13/2018   Procedure: LAPAROSCOPIC CHOLECYSTECTOMY;  Surgeon: Belinda Cough, MD;  Location: [facility] OR;  Service: General;  Laterality: N/A;   COLONOSCOPY  7/12   normal (hx of polyps in past)    COLONOSCOPY N/A 02/20/2024   Procedure: COLONOSCOPY;  Surgeon: Jinny Carmine, MD;  Location: [facility] ENDOSCOPY;  Service: Endoscopy;  Laterality: N/A;   CT SCAN  3/12   outside hosp- lung nodule and gallstones   ERCP N/A 06/12/2018   Procedure: ENDOSCOPIC RETROGRADE CHOLANGIOPANCREATOGRAPHY (ERCP);  Surgeon: Rollin Dover, MD;  Location: [facility] ENDOSCOPY;  Service: Endoscopy;  Laterality: N/A;   ESOPHAGOGASTRODUODENOSCOPY N/A 02/20/2024   Procedure: EGD (ESOPHAGOGASTRODUODENOSCOPY);  Surgeon: Jinny Carmine, MD;  Location: [facility] ENDOSCOPY;  Service: Endoscopy;  Laterality: N/A;   FINGER SURGERY Left 1959 & 1976   knot on index   KIDNEY DONATION Left 1989   LUMBAR LAMINECTOMY/DECOMPRESSION MICRODISCECTOMY  1999   LUMBAR LAMINECTOMY/DECOMPRESSION MICRODISCECTOMY  01/31/2012   Procedure: LUMBAR LAMINECTOMY/DECOMPRESSION MICRODISCECTOMY 2 LEVELS;  Surgeon: Alm GORMAN Molt, MD;  Location: [facility] NEURO ORS;  Service: Neurosurgery;  Laterality: N/A;  Thoracic twelve-lumbar one, lumbar one-two laminectomy    POSTERIOR LAMINECTOMY / DECOMPRESSION CERVICAL SPINE  1999   REMOVAL OF STONES  06/12/2018   Procedure:  REMOVAL OF STONES;  Surgeon: Rollin Dover, MD;  Location: [facility] END[facility]COPY;  Service: Endoscopy;;   SPHINCTEROTOMY  06/12/2018   Procedure: ANNETT;  Surgeon: Rollin Dover, MD;  Location: [facility] ENDOSCOPY;  Service: Endoscopy;;   TUBAL LIGATION  1976   UPPER GASTROINTESTINAL ENDOSCOPY     Family History  Problem Relation Age of Onset   Arthritis Mother    Cancer Mother        uterine and mouth   Hyperlipidemia Mother    Stroke Mother    Hypertension Mother    Alcohol abuse Father    Diabetes Father    Cancer Sister         breast   Diabetes Sister    Hyperlipidemia Sister    Heart disease Sister    Hypertension Sister    Diabetes Sister    Cancer Brother        lung cancer   Kidney disease Brother    Diabetes Brother    Hyperlipidemia Brother    Kidney failure Brother    Diabetes Daughter    Heart attack Daughter    Addison's disease Other    Social History   Occupational History   Occupation: retired - freight forwarder   Tobacco Use   Smoking status: Never   Smokeless tobacco: Never  Vaping Use   Vaping status: Never Used  Substance and Sexual Activity   Alcohol use: No    Alcohol/week: 0.0 standard drinks of alcohol   Drug use: No   Sexual activity: Yes   Tobacco Counseling Counseling given: Not Answered  SDOH Screenings   Food Insecurity: No Food Insecurity (04/27/2024)  Housing: Low Risk  (04/27/2024)  Transportation Needs: No Transportation Needs (04/27/2024)  Utilities: Not At Risk (04/27/2024)  Alcohol Screen: Low Risk  (04/27/2024)  Depression (PHQ2-9): Low Risk  (04/27/2024)  Financial Resource Strain: Low Risk  (05/31/2022)  Physical Activity: Inactive (04/27/2024)  Social Connections: Moderately Isolated (04/27/2024)  Stress: No Stress Concern Present (04/27/2024)  Tobacco Use: Low Risk  (04/27/2024)  Health Literacy: Adequate Health Literacy (04/27/2024)   See flowsheets for full screening details  Depression Screen PHQ 2 & 9 Depression Scale- Over the past 2 weeks, how often have you been bothered by any of the following problems? Little interest or pleasure in doing things: 0 Feeling down, depressed, or hopeless (PHQ Adolescent also includes...irritable): 0 PHQ-2 Total Score: 0 Trouble falling or staying asleep, or sleeping too much: 0 Feeling tired or having little energy: 0 Poor appetite or overeating (PHQ Adolescent also includes...weight loss): 3 Feeling bad about yourself - or that you are a failure or have let yourself or your family down: 0 Trouble  concentrating on things, such as reading the newspaper or watching television (PHQ Adolescent also includes...like school work): 0 Moving or speaking so slowly that other people could have noticed. Or the opposite - being so fidgety or restless that you have been moving around a lot more than usual: 0 Thoughts that you would be better off dead, or of hurting yourself in some way: 0 PHQ-9 Total Score: 3 If you checked off any problems, how difficult have these problems made it for you to do your work, take care of things at home, or get along with other people?: Not difficult at all     Goals Addressed             This Visit's Progress    COMPLETED: Increase water intake       Starting 04/22/2016,  I will attempt to drink at least 8 oz of water with each meal daily.      COMPLETED: Patient Stated       06/10/2019, I will maintain and continue medications as prescribed.      COMPLETED: Patient Stated       Would like to be more active.     Patient Stated       More water, more strength and stability       Visit info / Clinical Intake: Medicare Wellness Visit Type:: Subsequent Annual Wellness Visit Persons participating in visit:: patient Medicare Wellness Visit Mode:: In-person (required for WTM) Information given by:: patient Interpreter Needed?: No Pre-visit prep was completed: yes AWV questionnaire completed by patient prior to visit?: no Living arrangements:: lives with spouse/significant other Patient's Overall Health Status Rating: (!) fair Typical amount of pain: some Does pain affect daily life?: (!) yes Are you currently prescribed opioids?: (!) yes  Dietary Habits and Nutritional Risks How many meals a day?: 2 (two smal meals) Eats fruit and vegetables daily?: yes Most meals are obtained by: preparing own meals; eating out In the last 2 weeks, have you had any of the following?: none Diabetic:: (!) yes Any non-healing wounds?: no How often do you check your BS?:  continuous glucose monitor Would you like to be referred to a Nutritionist or for Diabetic Management? : no  Functional Status Activities of Daily Living (to include ambulation/medication): Independent Ambulation: Independent with device- listed below Home Assistive Devices/Equipment: Cane Medication Administration: Independent Home Management: Independent Manage your own finances?: yes Primary transportation is: driving Concerns about vision?: no *vision screening is required for WTM* Concerns about hearing?: no  Fall Screening Falls in the past year?: 0 Number of falls in past year: 0 Was there an injury with Fall?: 0 Fall Risk Category Calculator: 0 Patient Fall Risk Level: Low Fall Risk  Fall Risk Patient at Risk for Falls Due to: No Fall Risks Fall risk Follow up: Education provided; Falls prevention discussed  Home and Transportation Safety: All rugs have non-skid backing?: N/A, no rugs All stairs or steps have railings?: yes Grab bars in the bathtub or shower?: yes Have non-skid surface in bathtub or shower?: yes Good home lighting?: yes Regular seat belt use?: yes Hospital stays in the last year:: (!) yes How many hospital stays:: 4 Reason: September 2025-anemic transfusion  Cognitive Assessment Difficulty concentrating, remembering, or making decisions? : no Will 6CIT or Mini Cog be Completed: yes What year is it?: 0 points What month is it?: 0 points Give patient an address phrase to remember (5 components): [address] [place]  About what time is it?: 0 points Count backwards from 20 to 1: 0 points Say the months of the year in reverse: 0 points Repeat the address phrase from earlier: 0 points 6 CIT Score: 0 points  Advance Directives (For Healthcare) Does Patient Have a Medical Advance Directive?: Yes Type of Advance Directive: Healthcare Power of [place]; Living will Copy of Healthcare Power of Attorney in Chart?: No - copy requested Copy  of Living Will in Chart?: No - copy requested Would patient like information on creating a medical advance directive?: No - Patient declined  Reviewed/Updated  Reviewed/Updated: Reviewed All (Medical, Surgical, Family, Medications, Allergies, Care Teams, Patient Goals)        Objective:    Today's Vitals   04/27/24 0942  BP: 102/60  Weight: 144 lb 6.4 oz (65.5 kg)  Height: 5' 1 (1.549 m)  Body mass index is 27.28 kg/m.  Current Medications (verified) Outpatient Encounter Medications as of 04/27/2024  Medication Sig   amLODipine  (NORVASC ) 2.5 MG tablet Take 2.5 mg by mouth at bedtime.   ascorbic acid (VITAMIN C) 500 MG tablet Take 500 mg by mouth daily.   atorvastatin  (LIPITOR ) 80 MG tablet Take 1 tablet by mouth once daily   carvedilol  (COREG ) 12.5 MG tablet TAKE 1 TABLET BY MOUTH TWICE DAILY WITH A MEAL   cetirizine (ZYRTEC) 10 MG tablet Take 10 mg by mouth daily.   Continuous Glucose Sensor (FREESTYLE LIBRE 3 SENSOR) MISC USE TO CHECK GLUCOSE CONTINOUSLY. CHANGE EVERY 14 DAYS   dapagliflozin  propanediol (FARXIGA ) 5 MG TABS tablet Take 5 mg by mouth daily.   famotidine  (PEPCID ) 20 MG tablet Take 1 tablet by mouth once daily   furosemide  (LASIX ) 20 MG tablet Take 1 tablet (20 mg total) by mouth daily as needed.   gabapentin  (NEURONTIN ) 300 MG capsule TAKE 1 CAPSULE BY MOUTH THREE TIMES DAILY   hydrocortisone  2.5 % cream Apply topically 3 (three) times daily as needed.   insulin  degludec (TRESIBA) 100 UNIT/ML FlexTouch Pen Inject 26 Units into the skin at bedtime.   isosorbide  mononitrate (IMDUR ) 30 MG 24 hr tablet Take 1 tablet (30 mg total) by mouth daily.   LORazepam  (ATIVAN ) 0.5 MG tablet Take 1 tablet by mouth every 8 (eight) hours as needed.   nystatin cream (MYCOSTATIN) Apply 1 application topically 2 (two) times daily as needed (irritation.).    pantoprazole  (PROTONIX ) 40 MG tablet Take 1 tablet (40 mg total) by mouth 2 (two) times daily before a meal.   Rivaroxaban  (XARELTO) 15 MG TABS tablet Take 15 mg by mouth daily with supper.   traMADol  (ULTRAM ) 50 MG tablet Take 1-2 tablets (50-100 mg total) by mouth every 6 (six) hours as needed for severe pain (pain score 7-10).   triamcinolone  cream (KENALOG ) 0.1 % Apply 1 Application topically 2 (two) times daily as needed.   TRULICITY 1.5 MG/0.5ML SOAJ Inject into the skin once a week.   TURMERIC PO Take 1,000 mg by mouth daily.   vitamin B-12 (CYANOCOBALAMIN ) 1000 MCG tablet Take 500 mcg by mouth daily.   iron  polysaccharides (NIFEREX) 150 MG capsule Take 1 capsule (150 mg total) by mouth daily. (Patient not taking: Reported on 04/27/2024)   Respiratory Therapy Supplies (NEBULIZER/TUBING/MOUTHPIECE) KIT 1 each by Does not apply route 3 (three) times daily as needed. (Patient not taking: Reported on 04/27/2024)   No facility-administered encounter medications on file as of 04/27/2024.   Hearing/Vision screen No results found. Immunizations and Health Maintenance Health Maintenance  Topic Date Due   FOOT EXAM  04/05/2023   Medicare Annual Wellness (AWV)  08/01/2023   Mammogram  12/16/2023   Influenza Vaccine  08/31/2024 (Originally 01/02/2024)   COVID-19 Vaccine (4 - 2025-26 season) 02/18/2026 (Originally 02/02/2024)   Zoster Vaccines- Shingrix (1 of 2) 05/04/2026 (Originally 12/09/1953)   HEMOGLOBIN A1C  08/18/2024   OPHTHALMOLOGY EXAM  08/26/2024   Bone Density Scan  12/15/2025   Pneumococcal Vaccine: 50+ Years  Completed   Meningococcal B Vaccine  Aged Out   DTaP/Tdap/Td  Discontinued        Assessment/Plan:  This is a routine wellness examination for Morgan Hubbard.  Patient Care Team: Tower, Laine LABOR, MD as PCP - General (Family Medicine) Chales Idol, MD as Attending Physician (Urology) Perla Evalene PARAS, MD as Consulting Physician (Cardiology) Austin Clem Bluford Ross, MD as Referring Physician (Cardiology) Chrystie,  Edmund CROME, MD as Consulting Physician (Dermatology) Ward, Rodgers, MD as Referring  Physician (Internal Medicine) Hosie Lares, MD as Referring Physician (Neurology) Kimmick, Truman Govern, MD as Referring Physician (Oncology) Damian Therisa HERO, MD as Physician Assistant (Internal Medicine) Watt Mirza, MD as Consulting Physician (Family Medicine) Edmundo Charlie FERNS., DDS as Consulting Physician (Dentistry) Jaye Fallow, MD as Referring Physician (Ophthalmology) Geronimo Manuelita SAUNDERS, [name] as Pharmacist (Pharmacist)  I have personally reviewed and noted the following in the patient's chart:   Medical and social history Use of alcohol, tobacco or illicit drugs  Current medications and supplements including opioid prescriptions. Functional ability and status Nutritional status Physical activity Advanced directives List of other physicians Hospitalizations, surgeries, and ER visits in previous 12 months Vitals Screenings to include cognitive, depression, and falls Referrals and appointments  No orders of the defined types were placed in this encounter.  In addition, I have reviewed and discussed with patient certain preventive protocols, quality metrics, and best practice recommendations. A written personalized care plan for preventive services as well as general preventive health recommendations were provided to patient.   Erminio CROME Saris, LPN   [id number]   No follow-ups on file.  After Visit Summary: (In Person-Printed) AVS printed and given to the patient  Nurse Notes: Pt has no concerns or questions. CPE made for one year

## 2024-04-27 NOTE — Patient Instructions (Signed)
 Morgan Hubbard,  Thank you for taking the time for your Medicare Wellness Visit. I appreciate your continued commitment to your health goals. Please review the care plan we discussed, and feel free to reach out if I can assist you further.  Please note that Annual Wellness Visits do not include a physical exam. Some assessments may be limited, especially if the visit was conducted virtually. If needed, we may recommend an in-person follow-up with your provider.  Ongoing Care Seeing your primary care provider every 3 to 6 months helps us  monitor your health and provide consistent, personalized care.   Referrals If a referral was made during today's visit and you haven't received any updates within two weeks, please contact the referred provider directly to check on the status.  Recommended Screenings:  Health Maintenance  Topic Date Due   Complete foot exam   04/05/2023   Medicare Annual Wellness Visit  08/01/2023   Breast Cancer Screening  12/16/2023   Flu Shot  08/31/2024*   COVID-19 Vaccine (4 - 2025-26 season) 02/18/2026*   Zoster (Shingles) Vaccine (1 of 2) 05/04/2026*   Hemoglobin A1C  08/18/2024   Eye exam for diabetics  08/26/2024   Osteoporosis screening with Bone Density Scan  12/15/2025   Pneumococcal Vaccine for age over 70  Completed   Meningitis B Vaccine  Aged Out   DTaP/Tdap/Td vaccine  Discontinued  *Topic was postponed. The date shown is not the original due date.       04/27/2024    9:46 AM  Advanced Directives  Does Patient Have a Medical Advance Directive? Yes  Type of Estate Agent of [place];Living will  Copy of Healthcare Power of Attorney in Chart? No - copy requested    Vision: Annual vision screenings are recommended for early detection of glaucoma, cataracts, and diabetic retinopathy. These exams can also reveal signs of chronic conditions such as diabetes and high blood pressure.  Dental: Annual dental screenings help detect early  signs of oral cancer, gum disease, and other conditions linked to overall health, including heart disease and diabetes.

## 2024-04-29 ENCOUNTER — Encounter (HOSPITAL_COMMUNITY): Payer: Self-pay

## 2024-04-29 ENCOUNTER — Emergency Department (HOSPITAL_COMMUNITY)
Admission: EM | Admit: 2024-04-29 | Discharge: 2024-04-30 | Disposition: A | Discharge status: Home / Self Care | Attending: Emergency Medicine | Admitting: Emergency Medicine

## 2024-04-29 ENCOUNTER — Other Ambulatory Visit: Payer: Self-pay

## 2024-04-29 ENCOUNTER — Emergency Department (HOSPITAL_COMMUNITY)

## 2024-04-29 DIAGNOSIS — R519 Headache, unspecified: Secondary | ICD-10-CM | POA: Diagnosis not present

## 2024-04-29 DIAGNOSIS — N183 Chronic kidney disease, stage 3 unspecified: Secondary | ICD-10-CM | POA: Insufficient documentation

## 2024-04-29 DIAGNOSIS — E860 Dehydration: Secondary | ICD-10-CM | POA: Diagnosis not present

## 2024-04-29 DIAGNOSIS — E1122 Type 2 diabetes mellitus with diabetic chronic kidney disease: Secondary | ICD-10-CM | POA: Diagnosis not present

## 2024-04-29 DIAGNOSIS — Z794 Long term (current) use of insulin: Secondary | ICD-10-CM | POA: Diagnosis not present

## 2024-04-29 DIAGNOSIS — Z8673 Personal history of transient ischemic attack (TIA), and cerebral infarction without residual deficits: Secondary | ICD-10-CM | POA: Insufficient documentation

## 2024-04-29 DIAGNOSIS — Z950 Presence of cardiac pacemaker: Secondary | ICD-10-CM | POA: Insufficient documentation

## 2024-04-29 DIAGNOSIS — G4489 Other headache syndrome: Secondary | ICD-10-CM | POA: Diagnosis not present

## 2024-04-29 DIAGNOSIS — N179 Acute kidney failure, unspecified: Secondary | ICD-10-CM | POA: Diagnosis not present

## 2024-04-29 DIAGNOSIS — I129 Hypertensive chronic kidney disease with stage 1 through stage 4 chronic kidney disease, or unspecified chronic kidney disease: Secondary | ICD-10-CM | POA: Insufficient documentation

## 2024-04-29 DIAGNOSIS — R001 Bradycardia, unspecified: Secondary | ICD-10-CM | POA: Diagnosis not present

## 2024-04-29 LAB — CBC WITH DIFFERENTIAL/PLATELET
Abs Immature Granulocytes: 0.05 K/uL (ref 0.00–0.07)
Basophils Absolute: 0 K/uL (ref 0.0–0.1)
Basophils Relative: 0 %
Eosinophils Absolute: 0.4 K/uL (ref 0.0–0.5)
Eosinophils Relative: 4 %
HCT: 37.7 % (ref 36.0–46.0)
Hemoglobin: 12 g/dL (ref 12.0–15.0)
Immature Granulocytes: 1 %
Lymphocytes Relative: 23 %
Lymphs Abs: 2.2 K/uL (ref 0.7–4.0)
MCH: 28.1 pg (ref 26.0–34.0)
MCHC: 31.8 g/dL (ref 30.0–36.0)
MCV: 88.3 fL (ref 80.0–100.0)
Monocytes Absolute: 0.7 K/uL (ref 0.1–1.0)
Monocytes Relative: 8 %
Neutro Abs: 6.2 K/uL (ref 1.7–7.7)
Neutrophils Relative %: 64 %
Platelets: 186 K/uL (ref 150–400)
RBC: 4.27 MIL/uL (ref 3.87–5.11)
RDW: 15.7 % — ABNORMAL HIGH (ref 11.5–15.5)
WBC: 9.5 K/uL (ref 4.0–10.5)
nRBC: 0 % (ref 0.0–0.2)

## 2024-04-29 LAB — COMPREHENSIVE METABOLIC PANEL WITH GFR
ALT: 13 U/L (ref 0–44)
AST: 26 U/L (ref 15–41)
Albumin: 2.7 g/dL — ABNORMAL LOW (ref 3.5–5.0)
Alkaline Phosphatase: 86 U/L (ref 38–126)
Anion gap: 12 (ref 5–15)
BUN: 50 mg/dL — ABNORMAL HIGH (ref 8–23)
CO2: 24 mmol/L (ref 22–32)
Calcium: 8.1 mg/dL — ABNORMAL LOW (ref 8.9–10.3)
Chloride: 96 mmol/L — ABNORMAL LOW (ref 98–111)
Creatinine, Ser: 2.54 mg/dL — ABNORMAL HIGH (ref 0.44–1.00)
GFR, Estimated: 18 mL/min — ABNORMAL LOW (ref >60)
Glucose, Bld: 269 mg/dL — ABNORMAL HIGH (ref 70–99)
Potassium: 4.6 mmol/L (ref 3.5–5.1)
Sodium: 132 mmol/L — ABNORMAL LOW (ref 135–145)
Total Bilirubin: 0.6 mg/dL (ref 0.0–1.2)
Total Protein: 5.8 g/dL — ABNORMAL LOW (ref 6.5–8.1)

## 2024-04-29 MED ORDER — LIDOCAINE HCL (PF) 1 % IJ SOLN
10.0000 mL | Freq: Once | INTRAMUSCULAR | Status: DC
Start: 2024-04-29 — End: 2024-04-30
  Filled 2024-04-29: qty 10

## 2024-04-29 MED ORDER — DEXAMETHASONE SOD PHOSPHATE PF 10 MG/ML IJ SOLN
10.0000 mg | Freq: Once | INTRAMUSCULAR | Status: AC
  Administered 2024-04-30: 10 mg via INTRAVENOUS

## 2024-04-29 MED ORDER — LACTATED RINGERS IV BOLUS
1000.0000 mL | Freq: Once | INTRAVENOUS | Status: AC
  Administered 2024-04-30: 1000 mL via INTRAVENOUS

## 2024-04-29 MED ORDER — PROCHLORPERAZINE EDISYLATE 10 MG/2ML IJ SOLN
10.0000 mg | Freq: Once | INTRAMUSCULAR | Status: AC
  Administered 2024-04-30: 10 mg via INTRAVENOUS
  Filled 2024-04-29: qty 2

## 2024-04-29 NOTE — ED Triage Notes (Signed)
 Pt BIBA for c/o occipital HA 10/10 X 26 hrs. Pt has hx of migraines, is on xarelto for hx of TIA. No neuro deficits noted.

## 2024-04-30 MED ORDER — LACTATED RINGERS IV BOLUS
1000.0000 mL | Freq: Once | INTRAVENOUS | Status: AC
  Administered 2024-04-30: 1000 mL via INTRAVENOUS

## 2024-04-30 MED ORDER — MAGNESIUM SULFATE 2 GM/50ML IV SOLN
2.0000 g | Freq: Once | INTRAVENOUS | Status: AC
Start: 2024-04-30 — End: 2024-04-30
  Administered 2024-04-30: 2 g via INTRAVENOUS
  Filled 2024-04-30: qty 50

## 2024-04-30 MED ORDER — VALPROATE SODIUM 100 MG/ML IV SOLN
500.0000 mg | Freq: Once | INTRAVENOUS | Status: DC
  Filled 2024-04-30: qty 5

## 2024-04-30 NOTE — ED Notes (Signed)
 Pt accidentally removed PIV in bathroom. RN attempt X 2 to place additional IV, unsuccessful. Will request other RN attempt

## 2024-04-30 NOTE — ED Provider Notes (Signed)
 Emergency Department Provider Note  TRIAGE NOTE: Pt BIBA for c/o occipital HA 10/10 X 26 hrs. Pt has hx of migraines, is on xarelto for hx of TIA. No neuro deficits noted.   HISTORY  Chief Complaint Headache   HPI Morgan Hubbard is a 89 y.o. female with  a history of migraines and a pacemaker, presenting with a severe headache that began last night around 9:00 PM, rated as 10/10 in intensity. The headache initially started at the back of her head and has since progressed to the top left side, with some residual pain in the original location. This headache is described as worse than her typical migraines, which usually occur in the frontal region. She denies any recent falls, trauma, photophobia, nausea, vomiting, or vision problems. There are no neurological deficits noted, and her gait remains normal despite not using her walker. She has a history of taking Tramadol  for pain, which did not alleviate the headache, and she also took a baby aspirin  and another unspecified pain medication stronger than Tramadol . She denies any recent illnesses, fever, or other systemic symptoms. The history was obtained from the patient and EMS and corroborated by family members present.  PMH Past Medical History:  Diagnosis Date   Arthritis    hands (01/13/2014)   Basal cell carcinoma 01/2014   bridge of nose   Cardiac LV ejection fraction >40%    it was 43 last year (01/13/2014)   Chronic lower back pain    CKD (chronic kidney disease), stage III (HCC)    acute on chronic stage III/notes 06/10/2018   Colon polyps    Expressive aphasia    3 times in the last week (01/13/2014)   GERD (gastroesophageal reflux disease)    Goiter past remote   treated with RI   Hyperlipidemia    Hyperpotassemia    Hypertension    Hypertonicity of bladder    Inflammatory and toxic neuropathy, unspecified    LBBB (left bundle branch block)    Migraine    stopped many years ago (01/13/2014)   Mini stroke 01/2014    Other primary cardiomyopathies    Overactive bladder    Presence of combination internal cardiac defibrillator (ICD) and pacemaker    Reflex sympathetic dystrophy, unspecified    Shingles    Shortness of breath    ambulation   Stroke (HCC)    Thyroid  disease    Type II diabetes mellitus (HCC)    Ulcer    Urine incontinence    Vertigo    hx of    Home Medications Prior to Admission medications   Medication Sig Start Date End Date Taking? Authorizing Provider  amLODipine  (NORVASC ) 2.5 MG tablet Take 2.5 mg by mouth at bedtime. 04/22/24   [provider]  ascorbic acid (VITAMIN C) 500 MG tablet Take 500 mg by mouth daily.    [provider]  atorvastatin  (LIPITOR ) 80 MG tablet Take 1 tablet by mouth once daily 03/17/24   Gollan, Timothy J, MD  carvedilol  (COREG ) 12.5 MG tablet TAKE 1 TABLET BY MOUTH TWICE DAILY WITH A MEAL 03/15/24   Furth, Cadence H, PA-C  cetirizine (ZYRTEC) 10 MG tablet Take 10 mg by mouth daily.    [provider]  Continuous Glucose Sensor (FREESTYLE LIBRE 3 SENSOR) MISC USE TO CHECK GLUCOSE CONTINOUSLY. CHANGE EVERY 14 DAYS 01/20/24   Corwin Antu, FNP  dapagliflozin  propanediol (FARXIGA ) 5 MG TABS tablet Take 5 mg by mouth daily.    [provider]  famotidine  (PEPCID ) 20 MG tablet Take 1 tablet by mouth once daily 01/01/24   Tower, Laine LABOR, MD  furosemide  (LASIX ) 20 MG tablet Take 1 tablet (20 mg total) by mouth daily as needed. 04/26/24   Gollan, Timothy J, MD  gabapentin  (NEURONTIN ) 300 MG capsule TAKE 1 CAPSULE BY MOUTH THREE TIMES DAILY 04/29/23   Tower, Laine LABOR, MD  hydrocortisone  2.5 % cream Apply topically 3 (three) times daily as needed. 02/17/23   Jimmy Charlie FERNS, MD  insulin  degludec (TRESIBA) 100 UNIT/ML FlexTouch Pen Inject 26 Units into the skin at bedtime. 05/20/22   [provider]  iron  polysaccharides (NIFEREX) 150 MG capsule Take 1 capsule (150 mg total) by mouth daily. Patient not taking:  Reported on 04/27/2024 02/21/24   Patel, Sona, MD  isosorbide  mononitrate (IMDUR ) 30 MG 24 hr tablet Take 1 tablet (30 mg total) by mouth daily. 02/18/24   Furth, Cadence H, PA-C  LORazepam  (ATIVAN ) 0.5 MG tablet Take 1 tablet by mouth every 8 (eight) hours as needed. 03/10/23   [provider]  nystatin cream (MYCOSTATIN) Apply 1 application topically 2 (two) times daily as needed (irritation.).  12/15/19   [provider]  pantoprazole  (PROTONIX ) 40 MG tablet Take 1 tablet (40 mg total) by mouth 2 (two) times daily before a meal. 02/21/24   Tobie Calix, MD  Respiratory Therapy Supplies (NEBULIZER/TUBING/MOUTHPIECE) KIT 1 each by Does not apply route 3 (three) times daily as needed. Patient not taking: Reported on 04/27/2024 07/30/22   Dugal, Tabitha, FNP  Rivaroxaban (XARELTO) 15 MG TABS tablet Take 15 mg by mouth daily with supper. 12/20/22   [provider]  traMADol  (ULTRAM ) 50 MG tablet Take 1-2 tablets (50-100 mg total) by mouth every 6 (six) hours as needed for severe pain (pain score 7-10). 04/06/24   Tower, Laine LABOR, MD  triamcinolone  cream (KENALOG ) 0.1 % Apply 1 Application topically 2 (two) times daily as needed. 10/08/23   Jimmy Charlie FERNS, MD  TRULICITY 1.5 MG/0.5ML SOAJ Inject into the skin once a week.    [provider]  TURMERIC PO Take 1,000 mg by mouth daily.    [provider]  vitamin B-12 (CYANOCOBALAMIN ) 1000 MCG tablet Take 500 mcg by mouth daily.    [provider]    Social History Social History   Tobacco Use   Smoking status: Never   Smokeless tobacco: Never  Vaping Use   Vaping status: Never Used  Substance Use Topics   Alcohol use: No    Alcohol/week: 0.0 standard drinks of alcohol   Drug use: No    Review of Systems: Documented in HPI ____________________________________________  PHYSICAL EXAM: VITAL SIGNS: Triage: Blood pressure (!) 161/61, pulse 85, temperature 97.8 F (36.6 C), temperature source Oral,  resp. rate 18, height 5' 1 (1.549 m), weight 64.4 kg, last menstrual period 06/03/1992, SpO2 98%.  Vitals:   04/29/24 2300 04/29/24 2310 04/29/24 2311  BP:  (!) 161/61   Pulse:  85   Resp:  18   Temp:  97.8 F (36.6 C)   TempSrc:  Oral   SpO2: 97% 98%   Weight:   64.4 kg  Height:   5' 1 (1.549 m)    Physical Exam Vitals and nursing note reviewed.  Constitutional:      Appearance: She is well-developed.  HENT:     Head: Normocephalic and atraumatic.  Cardiovascular:     Rate and Rhythm: Normal rate and regular rhythm.  Pulmonary:     Effort: No respiratory distress.     Breath sounds: No stridor.  Abdominal:     General: There is no distension.  Musculoskeletal:     Cervical back: Normal range of motion.  Neurological:     Mental Status: She is alert.     Cranial Nerves: No cranial nerve deficit, dysarthria or facial asymmetry.     Comments: Ambulated slowly, hunched over with assistance (usually uses walker). No ataxia, lightheadedness or obvious deficits.        ____________________________________________   LABS (all labs ordered are listed, but only abnormal results are displayed)  Labs Reviewed  CBC WITH DIFFERENTIAL/PLATELET - Abnormal; Notable for the following components:      Result Value   RDW 15.7 (*)    All other components within normal limits  COMPREHENSIVE METABOLIC PANEL WITH GFR - Abnormal; Notable for the following components:   Sodium 132 (*)    Chloride 96 (*)    Glucose, Bld 269 (*)    BUN 50 (*)    Creatinine, Ser 2.54 (*)    Calcium  8.1 (*)    Total Protein 5.8 (*)    Albumin 2.7 (*)    GFR, Estimated 18 (*)    All other components within normal limits   ____________________________________________  EKG   EKG Interpretation Date/Time:    Ventricular Rate:    PR Interval:    QRS Duration:    QT Interval:    QTC Calculation:   R Axis:      Text Interpretation:           ____________________________________________  RADIOLOGY  CT Head Wo Contrast Result Date: 04/29/2024 EXAM: CT HEAD WITHOUT CONTRAST 04/29/2024 11:41:21 PM TECHNIQUE: CT of the head was performed without the administration of intravenous contrast. Automated exposure control, iterative reconstruction, and/or weight based adjustment of the mA/kV was utilized to reduce the radiation dose to as low as reasonably achievable. COMPARISON: 07/04/2023 CLINICAL HISTORY: Increasing headaches. FINDINGS: BRAIN AND VENTRICLES: No acute hemorrhage. No acute infarct. Mildly atrophic changes are noted. Some areas of decreased attenuation are noted in the left frontal lobe adjacent to the lateral ventricle consistent with chronic white matter ischemic change. The overall appearance is similar to that seen on the prior exam. Heavy calcifications of the falx and tentorium are noted. No extra-axial collection. No mass effect or midline shift. ORBITS: No acute abnormality. SINUSES: No acute abnormality. SOFT TISSUES AND SKULL: No acute soft tissue abnormality. No skull fracture. IMPRESSION: 1. No acute intracranial abnormality. 2. Mild cerebral atrophy and chronic white matter ischemic change in the left frontal lobe adjacent to the lateral ventricle, unchanged from prior exam. Electronically signed by: Oneil Devonshire MD 04/29/2024 11:45 PM EST RP Workstation: [id number]   ____________________________________________  PROCEDURES  Procedure(s) performed:   Procedures ____________________________________________  INITIAL IMPRESSION / ASSESSMENT AND PLAN       Images ordered viewed and obtained by myself. Agree with Radiology interpretation. Details in ED course.  Labs ordered reviewed by myself as detailed in ED course.  Consultations obtained/considered detailed in ED course.    CRITICAL INTERVENTIONS:  N/a  Workup reassuring aside from AKI. Fluids given. Will need recheck in a week with PCP.   Headache  likely c/w occipital neuralgia but patient was hesitant about nerve block, so unclear. But her ct scan was good. Headache improved with standard headache cocktail meds and fluids. Stable for d/c in baseline neurologic status w/ PCP follow up for recheck of kidney  function.   FINAL IMPRESSION Final diagnoses:  Acute nonintractable headache, unspecified headache type  Dehydration  AKI (acute kidney injury)     Disposition A medical screening exam was performed and I feel the patient has had an appropriate workup for their chief complaint at this time and likelihood of emergent condition existing is low. They have been counseled on decision, DISCHARGE, follow up and which symptoms necessitate immediate return to the emergency department. They or their family verbally stated understanding and agreement with plan and discharged in stable condition.   ____________________________________________   NEW OUTPATIENT MEDICATIONS STARTED DURING THIS VISIT:  New Prescriptions   No medications on file    Note:  This note was prepared with assistance of Dragon voice recognition software. Occasional wrong-word or sound-a-like substitutions may have occurred due to the inherent limitations of voice recognition software.    Cimone Fahey, Selinda, MD 05/01/24 [phone]

## 2024-05-05 ENCOUNTER — Ambulatory Visit: Payer: Self-pay | Admitting: Family Medicine

## 2024-05-05 ENCOUNTER — Encounter: Payer: Self-pay | Admitting: Family Medicine

## 2024-05-05 ENCOUNTER — Ambulatory Visit: Admitting: Family Medicine

## 2024-05-05 VITALS — BP 139/60 | HR 76 | Temp 97.7°F | Ht 61.0 in | Wt 142.2 lb

## 2024-05-05 DIAGNOSIS — R8290 Unspecified abnormal findings in urine: Secondary | ICD-10-CM | POA: Diagnosis not present

## 2024-05-05 DIAGNOSIS — R300 Dysuria: Secondary | ICD-10-CM | POA: Diagnosis not present

## 2024-05-05 DIAGNOSIS — N179 Acute kidney failure, unspecified: Secondary | ICD-10-CM

## 2024-05-05 DIAGNOSIS — N3000 Acute cystitis without hematuria: Secondary | ICD-10-CM

## 2024-05-05 DIAGNOSIS — N184 Chronic kidney disease, stage 4 (severe): Secondary | ICD-10-CM

## 2024-05-05 DIAGNOSIS — M5481 Occipital neuralgia: Secondary | ICD-10-CM

## 2024-05-05 DIAGNOSIS — I10 Essential (primary) hypertension: Secondary | ICD-10-CM

## 2024-05-05 DIAGNOSIS — D649 Anemia, unspecified: Secondary | ICD-10-CM

## 2024-05-05 DIAGNOSIS — N3946 Mixed incontinence: Secondary | ICD-10-CM

## 2024-05-05 DIAGNOSIS — R29898 Other symptoms and signs involving the musculoskeletal system: Secondary | ICD-10-CM

## 2024-05-05 LAB — POC URINALSYSI DIPSTICK (AUTOMATED)
Bilirubin, UA: NEGATIVE
Blood, UA: 50 — AB
Glucose, UA: POSITIVE — AB
Ketones, UA: NEGATIVE
Nitrite, UA: POSITIVE — AB
Protein, UA: POSITIVE — AB
Spec Grav, UA: 1.01 (ref 1.010–1.025)
Urobilinogen, UA: 0.2 U/dL
pH, UA: 6 (ref 5.0–8.0)

## 2024-05-05 MED ORDER — CEPHALEXIN 500 MG PO CAPS: 500.0000 mg | ORAL_CAPSULE | Freq: Two times a day (BID) | ORAL | 0 refills | Status: DC

## 2024-05-05 NOTE — Patient Instructions (Addendum)
 The furosemide  (lasix ) is on your list for as needed instead of every day You can check in with Dr Gollan about that  Let's check a urinalysis today  Also labs   You can use ice on the headache area /ice pack   See the kidney doctor next week as planned

## 2024-05-05 NOTE — Progress Notes (Signed)
 Subjective:    Patient ID: Morgan Hubbard, female    DOB: [birth year], 89 y.o.   MRN: [id number]  HPI  Wt Readings from Last 3 Encounters:  05/05/24 142 lb 4 oz (64.5 kg)  04/29/24 142 lb (64.4 kg)  04/27/24 144 lb 6.4 oz (65.5 kg)   26.88 kg/m  Vitals:   05/05/24 1358 05/05/24 1427  BP: (!) 150/74 139/60  Pulse: 76   Temp: 97.7 F (36.5 C)   SpO2: 96%      Pt presents for follow up of  Anemia  Ckd -worse  ER visit on 11/27 for headache and AKI Frequent urination   Seen for severe occipital headache Thought to be occipital neuraltia  Given headache cocktail meds and fluids  CT was stable/reassuring  Bmet done and GFR down to 18 at that time   Is better than is was  Is left occiput  Gabapentin  300 mg tid    ? Last neuro    Was hospitalized this fall for acute GI bleed  From small bowel ulcer and angiodysplasia of colon  While on xarelto fo a fib   Oral iron     Lab Results  Component Value Date   WBC 9.5 04/29/2024   HGB 12.0 04/29/2024   HCT 37.7 04/29/2024   MCV 88.3 04/29/2024   PLT 186 04/29/2024   Lab Results  Component Value Date   IRON  53 03/31/2024   FERRITIN 18.3 03/31/2024   Is off her iron   Does not think she has any GI bleeding    Lab Results  Component Value Date   NA 132 (L) 04/29/2024   K 4.6 04/29/2024   CO2 24 04/29/2024   GLUCOSE 269 (H) 04/29/2024   BUN 50 (H) 04/29/2024   CREATININE 2.54 (H) 04/29/2024   CALCIUM  8.1 (L) 04/29/2024   GFR 29.32 (L) 03/01/2024   EGFR 34 (L) 02/17/2024   GFRNONAA 18 (L) 04/29/2024   Sees Dr Norine -has appointment 12/10  [facility]  Has overactive bladder with incontinence Stays wet all the time   Wants to check urinalysis  Some dysuria  Frequent urination  Feels wobbly  Results for orders placed or performed in visit on 05/05/24  POCT Urinalysis Dipstick (Automated)   Collection Time: 05/05/24  2:44 PM  Result Value Ref Range   Color, UA Light Yellow    Clarity, UA  Cloudy    Glucose, UA Positive (A) Negative   Bilirubin, UA Negative    Ketones, UA Negative    Spec Grav, UA 1.010 1.010 - 1.025   Blood, UA 50 Ery/uL (A)    pH, UA 6.0 5.0 - 8.0   Protein, UA Positive (A) Negative   Urobilinogen, UA 0.2 0.2 or 1.0 E.U./dL   Nitrite, UA Positive (A)    Leukocytes, UA Moderate (2+) (A) Negative      Patient Active Problem List   Diagnosis Date Noted   Polyp of descending colon 02/20/2024   Angiodysplasia of colon 02/20/2024   History of GI bleed 02/18/2024   CKD (chronic kidney disease) stage 4, GFR 15-29 ml/min (HCC) 02/18/2024   Perioral dermatitis 01/15/2024   Pruritus 10/08/2023   Poor balance 07/09/2023   Current use of proton pump inhibitor 03/24/2023   Rectal bleeding 02/17/2023   Senile purpura 01/20/2023   Pain due to onychomycosis of toenails of both feet 04/04/2022   Pre-syncope 11/29/2021   Hypotension 11/29/2021   History of breast cancer 06/18/2020   Symptomatic anemia 06/16/2020  Elevated TSH 06/16/2020   Occipital neuralgia 02/14/2020   Hair loss 11/09/2018   Leg weakness, bilateral 06/29/2018   Dysuria 06/29/2018   Pedal edema 06/22/2018   AICD (automatic cardioverter/defibrillator) present 06/11/2018   GERD (gastroesophageal reflux disease) 06/10/2018   AKI (acute kidney injury) 06/10/2018   Chronic back pain 05/23/2017   Chronic systolic (congestive) heart failure (HCC) 02/25/2017   Osteoporosis 06/16/2016   Routine general medical examination at a health care facility 04/24/2016   Estrogen deficiency 04/24/2016   UTI (urinary tract infection) 03/01/2014   Stroke, small vessel (HCC) 01/18/2014   Family history of hemochromatosis 01/18/2014   Nonischemic cardiomyopathy (HCC) 04/05/2013   Elevated transaminase level 05/18/2012   Spinal stenosis of lumbar region 12/10/2011   Mixed incontinence urge and stress 12/10/2011   Goiter 01/14/2011   Fatty liver 08/28/2010   Type II diabetes mellitus with renal  manifestations (HCC) 07/09/2010   Hyperlipidemia associated with type 2 diabetes mellitus (HCC) 07/09/2010   Hyperkalemia 07/09/2010   Reflex sympathetic dystrophy 07/09/2010   Neuropathy 07/09/2010   Essential hypertension 07/09/2010   CARDIOMYOPATHY 07/09/2010   OVERACTIVE BLADDER 07/09/2010   Past Medical History:  Diagnosis Date   Arthritis    hands (01/13/2014)   Basal cell carcinoma 01/2014   bridge of nose   Cardiac LV ejection fraction >40%    it was 43 last year (01/13/2014)   Chronic lower back pain    CKD (chronic kidney disease), stage III (HCC)    acute on chronic stage III/notes 06/10/2018   Colon polyps    Expressive aphasia    3 times in the last week (01/13/2014)   GERD (gastroesophageal reflux disease)    Goiter past remote   treated with RI   Hyperlipidemia    Hyperpotassemia    Hypertension    Hypertonicity of bladder    Inflammatory and toxic neuropathy, unspecified    LBBB (left bundle branch block)    Migraine    stopped many years ago (01/13/2014)   Mini stroke 01/2014   Other primary cardiomyopathies    Overactive bladder    Presence of combination internal cardiac defibrillator (ICD) and pacemaker    Reflex sympathetic dystrophy, unspecified    Shingles    Shortness of breath    ambulation   Stroke (HCC)    Thyroid  disease    Type II diabetes mellitus (HCC)    Ulcer    Urine incontinence    Vertigo    hx of   Past Surgical History:  Procedure Laterality Date   BACK SURGERY     BI-VENTRICULAR PACEMAKER INSERTION (CRT-P)  10/2014   [facility]   BREAST CYST EXCISION Left 1959   CARDIAC CATHETERIZATION  several   [place]   CATARACT EXTRACTION W/ INTRAOCULAR LENS IMPLANT Left ~ 2005   several eye injections   CATARACT EXTRACTION W/PHACO Right 05/04/2020   Procedure: CATARACT EXTRACTION PHACO AND INTRAOCULAR LENS PLACEMENT (IOC) RIGHT DIABETIC;  Surgeon: Jaye Fallow, MD;  Location: [facility] ORS;  Service: Ophthalmology;  Laterality:  Right;  US  00:58.5 CDE 6.70 Fluid Pack [place] # [id number]   CHOLECYSTECTOMY N/A 06/13/2018   Procedure: LAPAROSCOPIC CHOLECYSTECTOMY;  Surgeon: Belinda Cough, MD;  Location: [facility] OR;  Service: General;  Laterality: N/A;   COLONOSCOPY  7/12   normal (hx of polyps in past)    COLONOSCOPY N/A 02/20/2024   Procedure: COLONOSCOPY;  Surgeon: Jinny Carmine, MD;  Location: [facility] ENDOSCOPY;  Service: Endoscopy;  Laterality: N/A;   CT SCAN  3/12  outside hosp- lung nodule and gallstones   ERCP N/A 06/12/2018   Procedure: ENDOSCOPIC RETROGRADE CHOLANGIOPANCREATOGRAPHY (ERCP);  Surgeon: Rollin Dover, MD;  Location: [facility] ENDOSCOPY;  Service: Endoscopy;  Laterality: N/A;   ESOPHAGOGASTRODUODENOSCOPY N/A 02/20/2024   Procedure: EGD (ESOPHAGOGASTRODUODENOSCOPY);  Surgeon: Jinny Carmine, MD;  Location: [facility] ENDOSCOPY;  Service: Endoscopy;  Laterality: N/A;   FINGER SURGERY Left 1959 & 1976   knot on index   KIDNEY DONATION Left 1989   LUMBAR LAMINECTOMY/DECOMPRESSION MICRODISCECTOMY  1999   LUMBAR LAMINECTOMY/DECOMPRESSION MICRODISCECTOMY  01/31/2012   Procedure: LUMBAR LAMINECTOMY/DECOMPRESSION MICRODISCECTOMY 2 LEVELS;  Surgeon: Alm GORMAN Molt, MD;  Location: [facility] NEURO ORS;  Service: Neurosurgery;  Laterality: N/A;  Thoracic twelve-lumbar one, lumbar one-two laminectomy    POSTERIOR LAMINECTOMY / DECOMPRESSION CERVICAL SPINE  1999   REMOVAL OF STONES  06/12/2018   Procedure: REMOVAL OF STONES;  Surgeon: Rollin Dover, MD;  Location: [facility] ENDOSCOPY;  Service: Endoscopy;;   SPHINCTEROTOMY  06/12/2018   Procedure: ANNETT;  Surgeon: Rollin Dover, MD;  Location: [facility] ENDOSCOPY;  Service: Endoscopy;;   TUBAL LIGATION  1976   UPPER GASTROINTESTINAL ENDOSCOPY     Social History   Tobacco Use   Smoking status: Never   Smokeless tobacco: Never  Vaping Use   Vaping status: Never Used  Substance Use Topics   Alcohol use: No    Alcohol/week: 0.0 standard drinks of alcohol   Drug use: No   Family History   Problem Relation Age of Onset   Arthritis Mother    Cancer Mother        uterine and mouth   Hyperlipidemia Mother    Stroke Mother    Hypertension Mother    Alcohol abuse Father    Diabetes Father    Cancer Sister        breast   Diabetes Sister    Hyperlipidemia Sister    Heart disease Sister    Hypertension Sister    Diabetes Sister    Cancer Brother        lung cancer   Kidney disease Brother    Diabetes Brother    Hyperlipidemia Brother    Kidney failure Brother    Diabetes Daughter    Heart attack Daughter    Addison's disease Other    Allergies  Allergen Reactions   Ace Inhibitors Swelling    REACTION: tongue swelling   Lisinopril Swelling    Swelling of tongue   Insulin  Detemir Itching   Nsaids Other (See Comments)    Renal issues   Codeine Nausea Only   Hydrochlorothiazide Other (See Comments)    Dizziness/funny feeling   Tylenol  [Acetaminophen ] Other (See Comments)    Fatty liver - use with caution   Current Outpatient Medications on File Prior to Visit  Medication Sig Dispense Refill   amLODipine  (NORVASC ) 2.5 MG tablet Take 2.5 mg by mouth at bedtime.     ascorbic acid (VITAMIN C) 500 MG tablet Take 500 mg by mouth daily.     atorvastatin  (LIPITOR ) 80 MG tablet Take 1 tablet by mouth once daily 90 tablet 3   carvedilol  (COREG ) 12.5 MG tablet TAKE 1 TABLET BY MOUTH TWICE DAILY WITH A MEAL 180 tablet 3   cetirizine (ZYRTEC) 10 MG tablet Take 10 mg by mouth daily.     Continuous Glucose Sensor (FREESTYLE LIBRE 3 SENSOR) MISC USE TO CHECK GLUCOSE CONTINOUSLY. CHANGE EVERY 14 DAYS 6 each 0   dapagliflozin  propanediol (FARXIGA ) 5 MG TABS tablet Take 5 mg by mouth  daily.     famotidine  (PEPCID ) 20 MG tablet Take 1 tablet by mouth once daily 90 tablet 0   furosemide  (LASIX ) 20 MG tablet Take 1 tablet (20 mg total) by mouth daily as needed.     gabapentin  (NEURONTIN ) 300 MG capsule TAKE 1 CAPSULE BY MOUTH THREE TIMES DAILY 270 capsule 2   hydrocortisone  2.5 %  cream Apply topically 3 (three) times daily as needed. 28 g 3   insulin  degludec (TRESIBA) 100 UNIT/ML FlexTouch Pen Inject 26 Units into the skin at bedtime.     isosorbide  mononitrate (IMDUR ) 30 MG 24 hr tablet Take 1 tablet (30 mg total) by mouth daily. 90 tablet 3   LORazepam  (ATIVAN ) 0.5 MG tablet Take 1 tablet by mouth every 8 (eight) hours as needed.     nystatin cream (MYCOSTATIN) Apply 1 application topically 2 (two) times daily as needed (irritation.).      pantoprazole  (PROTONIX ) 40 MG tablet Take 1 tablet (40 mg total) by mouth 2 (two) times daily before a meal. 90 tablet 3   Respiratory Therapy Supplies (NEBULIZER/TUBING/MOUTHPIECE) KIT 1 each by Does not apply route 3 (three) times daily as needed. 1 kit 0   Rivaroxaban (XARELTO) 15 MG TABS tablet Take 15 mg by mouth daily with supper.     traMADol  (ULTRAM ) 50 MG tablet Take 1-2 tablets (50-100 mg total) by mouth every 6 (six) hours as needed for severe pain (pain score 7-10). 60 tablet 0   triamcinolone  cream (KENALOG ) 0.1 % Apply 1 Application topically 2 (two) times daily as needed. 45 g 1   TRULICITY 1.5 MG/0.5ML SOAJ Inject into the skin once a week.     TURMERIC PO Take 1,000 mg by mouth daily.     vitamin B-12 (CYANOCOBALAMIN ) 1000 MCG tablet Take 500 mcg by mouth daily.     No current facility-administered medications on file prior to visit.    Review of Systems  Constitutional:  Positive for fatigue. Negative for activity change, appetite change, fever and unexpected weight change.  HENT:  Negative for congestion, ear pain, rhinorrhea, sinus pressure and sore throat.   Eyes:  Negative for pain, redness and visual disturbance.  Respiratory:  Negative for cough, shortness of breath and wheezing.   Cardiovascular:  Negative for chest pain and palpitations.  Gastrointestinal:  Negative for abdominal pain, blood in stool, constipation and diarrhea.  Endocrine: Negative for polydipsia and polyuria.  Genitourinary:  Positive  for dysuria, frequency and urgency. Negative for flank pain, hematuria and pelvic pain.  Musculoskeletal:  Positive for arthralgias. Negative for back pain and myalgias.  Skin:  Negative for pallor and rash.  Allergic/Immunologic: Negative for environmental allergies.  Neurological:  Positive for headaches. Negative for dizziness, seizures, syncope, facial asymmetry, speech difficulty, light-headedness and numbness.       Poor balance   Hematological:  Negative for adenopathy. Does not bruise/bleed easily.  Psychiatric/Behavioral:  Negative for decreased concentration and dysphoric mood. The patient is not nervous/anxious.        Objective:   Physical Exam Constitutional:      General: She is not in acute distress.    Appearance: Normal appearance. She is well-developed and normal weight. She is not ill-appearing or diaphoretic.     Comments: Fatigued Frail appearing   HENT:     Head: Normocephalic and atraumatic.     Comments: Area of headache is left occiput  Non tender No facial tenderness    Right Ear: External ear normal.  Left Ear: External ear normal.     Mouth/Throat:     Mouth: Mucous membranes are moist.  Eyes:     General: No scleral icterus.       Right eye: No discharge.        Left eye: No discharge.     Conjunctiva/sclera: Conjunctivae normal.     Pupils: Pupils are equal, round, and reactive to light.  Neck:     Thyroid : No thyromegaly.     Vascular: No carotid bruit or JVD.  Cardiovascular:     Rate and Rhythm: Normal rate and regular rhythm.     Heart sounds: Normal heart sounds.     No gallop.  Pulmonary:     Effort: Pulmonary effort is normal. No respiratory distress.     Breath sounds: Normal breath sounds. No stridor. No wheezing, rhonchi or rales.  Abdominal:     General: There is no distension or abdominal bruit.     Palpations: Abdomen is soft.     Tenderness: There is no abdominal tenderness.  Musculoskeletal:     Cervical back: Normal  range of motion and neck supple. No tenderness.     Right lower leg: No edema.     Left lower leg: No edema.  Lymphadenopathy:     Cervical: No cervical adenopathy.  Skin:    General: Skin is warm and dry.     Coloration: Skin is not jaundiced or pale.     Findings: No rash.  Neurological:     Mental Status: She is alert.     Cranial Nerves: No cranial nerve deficit.     Motor: No weakness.     Coordination: Coordination normal.     Deep Tendon Reflexes: Reflexes are normal and symmetric. Reflexes normal.     Comments: Needs help for ambulation due to poor balance and generalized weakness   Psychiatric:        Mood and Affect: Mood normal.           Assessment & Plan:   Problem List Items Addressed This Visit       Cardiovascular and Mediastinum   Essential hypertension - Primary   bp in fair control at this time  BP Readings from Last 1 Encounters:  05/05/24 139/60   No changes needed Most recent labs reviewed  Disc lifstyle change with low sodium diet and exercise  Low gfr   Carvedilol  12.5 mg bid Isosorbide  30 mg daily  Lasix  20 mg daily (change to prn)  Amlodipione 2.5 mg daily  Blood pressure has been labile in the past       Relevant Orders   Basic metabolic panel with GFR   CBC with Differential/Platelet     Nervous and Auditory   Occipital neuralgia   Worsened recently  Seen in ER and given headache cocktail  Some susp of dehydration in setting of AKI Reviewed hospital records, lab results and studies in detail   Pain remains in left occiput but better than it was  CT was reassuring  Continues gabapentin  300 mg tid  If no further improvement consider neurology follow up (saw Dr Jama in [facility] in 2021)          Genitourinary   UTI (urinary tract infection)   Positive urinalysis  Aki  Culture pending   Keflex  500 mg bid to start pending culture       CKD (chronic kidney disease) stage 4, GFR 15-29 ml/min (HCC)   Recent GFR from ER  was  consistent with AKI  Urinalysis positive today-suspect uti / treating and pending culture       Relevant Orders   Basic metabolic panel with GFR   CBC with Differential/Platelet   AKI (acute kidney injury)   Recent GFR in ER was 18= down from 29.3  Urinalysis today positive for likely uti  Culture pending  Treating with keflex  Encouraged fluids  Holding lasix  unless needed Nephrology appointment is next week          Other   Symptomatic anemia   Cbc recently reassuring off iron   Pt wants to re check it today due to fatigue  Cbc with iron  studies ordered   No signs and symptoms of GI bleed at this time Continues xarelto       Relevant Orders   CBC with Differential/Platelet   Ferritin   Iron    Mixed incontinence urge and stress   Worsened possibly by uti  Encouraged hydration  Culture pending       Dysuria   Urinalysis positive  Suspect uti  Keflex  sent to start Pending culture Also aki       Relevant Orders   POCT Urinalysis Dipstick (Automated) (Completed)   Basic metabolic panel with GFR   Urine Culture   Other Visit Diagnoses       Abnormal urinalysis       Relevant Orders   Urine Culture

## 2024-05-05 NOTE — Assessment & Plan Note (Addendum)
 Worsened recently  Seen in ER and given headache cocktail  Some susp of dehydration in setting of AKI Reviewed hospital records, lab results and studies in detail   Pain remains in left occiput but better than it was  CT was reassuring  Continues gabapentin  300 mg tid  If no further improvement consider neurology follow up (saw Dr Jama in [facility] in 2021)

## 2024-05-05 NOTE — Assessment & Plan Note (Signed)
 Urinalysis positive  Suspect uti  Keflex  sent to start Pending culture Also aki

## 2024-05-05 NOTE — Assessment & Plan Note (Signed)
 Positive urinalysis  Aki  Culture pending   Keflex  500 mg bid to start pending culture

## 2024-05-05 NOTE — Assessment & Plan Note (Signed)
 Recent GFR from ER was consistent with AKI  Urinalysis positive today-suspect uti / treating and pending culture

## 2024-05-05 NOTE — Assessment & Plan Note (Signed)
 Recent GFR in ER was 18= down from 29.3  Urinalysis today positive for likely uti  Culture pending  Treating with keflex  Encouraged fluids  Holding lasix  unless needed Nephrology appointment is next week

## 2024-05-05 NOTE — Assessment & Plan Note (Signed)
 Worsened possibly by uti  Encouraged hydration  Culture pending

## 2024-05-05 NOTE — Assessment & Plan Note (Signed)
 bp in fair control at this time  BP Readings from Last 1 Encounters:  05/05/24 139/60   No changes needed Most recent labs reviewed  Disc lifstyle change with low sodium diet and exercise  Low gfr   Carvedilol  12.5 mg bid Isosorbide  30 mg daily  Lasix  20 mg daily (change to prn)  Amlodipione 2.5 mg daily  Blood pressure has been labile in the past

## 2024-05-05 NOTE — Assessment & Plan Note (Signed)
 Cbc recently reassuring off iron   Pt wants to re check it today due to fatigue  Cbc with iron  studies ordered   No signs and symptoms of GI bleed at this time Continues xarelto

## 2024-05-06 LAB — BASIC METABOLIC PANEL WITH GFR
BUN: 46 mg/dL — ABNORMAL HIGH (ref 6–23)
CO2: 29 meq/L (ref 19–32)
Calcium: 8.5 mg/dL (ref 8.4–10.5)
Chloride: 102 meq/L (ref 96–112)
Creatinine, Ser: 1.83 mg/dL — ABNORMAL HIGH (ref 0.40–1.20)
GFR: 24.55 mL/min — ABNORMAL LOW (ref >60.00)
Glucose, Bld: 454 mg/dL — ABNORMAL HIGH (ref 70–99)
Potassium: 4.6 meq/L (ref 3.5–5.1)
Sodium: 138 meq/L (ref 135–145)

## 2024-05-06 LAB — CBC WITH DIFFERENTIAL/PLATELET
Basophils Absolute: 0 K/uL (ref 0.0–0.1)
Basophils Relative: 0.2 % (ref 0.0–3.0)
Eosinophils Absolute: 0.3 K/uL (ref 0.0–0.7)
Eosinophils Relative: 2.7 % (ref 0.0–5.0)
HCT: 34.7 % — ABNORMAL LOW (ref 36.0–46.0)
Hemoglobin: 11.5 g/dL — ABNORMAL LOW (ref 12.0–15.0)
Lymphocytes Relative: 26.5 % (ref 12.0–46.0)
Lymphs Abs: 3 K/uL (ref 0.7–4.0)
MCHC: 33.2 g/dL (ref 30.0–36.0)
MCV: 85.6 fl (ref 78.0–100.0)
Monocytes Absolute: 0.6 K/uL (ref 0.1–1.0)
Monocytes Relative: 5.3 % (ref 3.0–12.0)
Neutro Abs: 7.3 K/uL (ref 1.4–7.7)
Neutrophils Relative %: 65.3 % (ref 43.0–77.0)
Platelets: 226 K/uL (ref 150.0–400.0)
RBC: 4.05 Mil/uL (ref 3.87–5.11)
RDW: 16.8 % — ABNORMAL HIGH (ref 11.5–15.5)
WBC: 11.2 K/uL — ABNORMAL HIGH (ref 4.0–10.5)

## 2024-05-06 LAB — IRON: Iron: 65 ug/dL (ref 42–145)

## 2024-05-06 LAB — FERRITIN: Ferritin: 26.8 ng/mL (ref 10.0–291.0)

## 2024-05-08 LAB — URINE CULTURE
MICRO NUMBER:: 17307396
SPECIMEN QUALITY:: ADEQUATE

## 2024-05-10 ENCOUNTER — Ambulatory Visit: Admitting: *Deleted

## 2024-05-10 ENCOUNTER — Telehealth: Payer: Self-pay | Admitting: *Deleted

## 2024-05-10 DIAGNOSIS — N3000 Acute cystitis without hematuria: Secondary | ICD-10-CM | POA: Diagnosis not present

## 2024-05-10 DIAGNOSIS — M5481 Occipital neuralgia: Secondary | ICD-10-CM

## 2024-05-10 MED ORDER — CEFTRIAXONE SODIUM 1 G IJ SOLR
1.0000 g | Freq: Once | INTRAMUSCULAR | Status: AC
  Administered 2024-05-10: 1 g via INTRAMUSCULAR

## 2024-05-10 MED ORDER — ONDANSETRON HCL 4 MG PO TABS: 4.0000 mg | ORAL_TABLET | Freq: Three times a day (TID) | ORAL | 0 refills | Status: AC | PRN

## 2024-05-10 NOTE — Telephone Encounter (Signed)
 Pt is coming in today at 10:30am for shot.  Pt said med has caused nausea in the past and wanted PCP to send in med since she is going to have to take this shot this week.  [facility] [address]  Pt did say the dysuria is worsening so she knows med wasn't working an wanted inj asap

## 2024-05-10 NOTE — Telephone Encounter (Signed)
 Needs rocepin 1 g today   I put in urgent referral to home care   Please reach out to pt

## 2024-05-10 NOTE — Telephone Encounter (Signed)
 Pt came in for a rocepin 1 g shot today per Dr. Randeen pt wanted me to ask Dr. Randeen a few questions  Pt asked how many injections/days she will need to get the shot.   Pt asked if PCP is going to send her something in for nausea. I clarified that rocepin caused nausea before and she said no but her great granddaughter had severe nausea with this shot and she wants something to take just to be on the safe side.  She also said the nerve pain in her neck/head is worsening and she knows PCP said she was going to check in with neurology but she needs something asap.   Pt also stated that she is going out of town tomorrow around 11am and wasn't sure if home health could come by to give her a shot by then tomorrow so scheduled another nurse visit tomorrow morning to get the shot here

## 2024-05-10 NOTE — Telephone Encounter (Signed)
 You can try going up on your gabapentin  to three times daily (use caution of sedation, balance, falls)   I put the referral in for neurology  Please let us  know if you don't hear in 1-2 weeks to set that up (mychart message or call or letter)

## 2024-05-10 NOTE — Telephone Encounter (Signed)
 Appt scheduled for today.

## 2024-05-10 NOTE — Telephone Encounter (Signed)
 Pt notified of Dr. Graham instructions will increase it to tid

## 2024-05-10 NOTE — Telephone Encounter (Signed)
 Per Dr. Randeen pt needs inj asap called pt to see if she could come in today for inj today but no answer. Left VM requesting pt to call the office back

## 2024-05-10 NOTE — Telephone Encounter (Signed)
 Optimally I want her to get 7 daily doses of rocephin  1 G  This should make her feel better  Thanks for scheduling shots today and tomrrow  Will she be out of town long?    Taking gabapentin  for her head Also tramadol    Looked at her neuro note from 2021  It was Dr Jama -[facility] in [facility]   Noted the gabapentin    Other options (carbamazepine, oxcarbazepine) were entertained but he really thought this would worsen balance /equilibrium problems   We may need to get her back for a visit   Going up on gabapentin  (also can cause dizziness so would be with caution-is the only option I see right now   Would she like a referral back to neuro to touch base?  I will send zofran  to pharmacy for nausea and hopefully will not need it at all  Use caution of sedation with this also

## 2024-05-10 NOTE — Telephone Encounter (Signed)
 Pt notified of Dr. Graham comments.   Pt is going to [place] just for the day and will be back tomorrow night, so she will be able to get her inj on Wednesday and not miss a day.  Pt is only taking 1 pill BID (not TID) of gabapentin  and is okay if PCP thinks she should increase it (aware of balance issues).  Pt does want to proceed with neurology, pt saw one in [place] a long time ago and doesn't want to see them again, pt did have neurologist in [place] but given the distance she is okay establishing with one here, Morgan Hubbard is fine but is in the middle so can go to [place] if needed. Pt advise PCP will place referral and if she hasn't received a call/letter/mychart within 2 weeks to let us  know.

## 2024-05-10 NOTE — Telephone Encounter (Signed)
 Copied from CRM [id number]. Topic: Appointments - Scheduling Inquiry for Clinic >> May 10, 2024  8:36 AM Jasmin G wrote: Reason for CRM: Pt requested a call back ASAP at [phone] to schedule an appt to get  an injectable ceftriaxone , please refer to recent Encounters for more info.

## 2024-05-10 NOTE — Progress Notes (Signed)
 Per orders of Dr. Laine Balls, injection of Ceftriaxone  1g given by Rayleen Forts M in Left quadriceps Patient tolerated injection well. Patient will make appointment for 1 day(s) for a repeat Cefriaxone 1g injection

## 2024-05-11 ENCOUNTER — Ambulatory Visit

## 2024-05-11 DIAGNOSIS — N3000 Acute cystitis without hematuria: Secondary | ICD-10-CM | POA: Diagnosis not present

## 2024-05-11 MED ORDER — CEFTRIAXONE SODIUM 1 G IJ SOLR
1.0000 g | Freq: Once | INTRAMUSCULAR | Status: AC
  Administered 2024-05-11: 1 g via INTRAMUSCULAR

## 2024-05-11 NOTE — Progress Notes (Signed)
 Per orders of Dr. Laine Balls, injection of Rocephin  1 gm IM(diluted with 2.1 ml of plain Lidocaine  1 %) given by Rielly Brunn in right upper anterior thigh. Patient tolerated injection well. Patient will make appointment for 1 day with home health for remainder of injections.

## 2024-05-12 ENCOUNTER — Ambulatory Visit

## 2024-05-12 ENCOUNTER — Other Ambulatory Visit: Payer: Self-pay | Admitting: Family Medicine

## 2024-05-12 DIAGNOSIS — K219 Gastro-esophageal reflux disease without esophagitis: Secondary | ICD-10-CM | POA: Diagnosis not present

## 2024-05-12 DIAGNOSIS — E875 Hyperkalemia: Secondary | ICD-10-CM | POA: Diagnosis not present

## 2024-05-12 DIAGNOSIS — N3000 Acute cystitis without hematuria: Secondary | ICD-10-CM

## 2024-05-12 DIAGNOSIS — N184 Chronic kidney disease, stage 4 (severe): Secondary | ICD-10-CM | POA: Diagnosis not present

## 2024-05-12 DIAGNOSIS — N3281 Overactive bladder: Secondary | ICD-10-CM | POA: Diagnosis not present

## 2024-05-12 DIAGNOSIS — R809 Proteinuria, unspecified: Secondary | ICD-10-CM | POA: Diagnosis not present

## 2024-05-12 DIAGNOSIS — I129 Hypertensive chronic kidney disease with stage 1 through stage 4 chronic kidney disease, or unspecified chronic kidney disease: Secondary | ICD-10-CM | POA: Diagnosis not present

## 2024-05-12 DIAGNOSIS — D649 Anemia, unspecified: Secondary | ICD-10-CM | POA: Diagnosis not present

## 2024-05-12 DIAGNOSIS — E1122 Type 2 diabetes mellitus with diabetic chronic kidney disease: Secondary | ICD-10-CM | POA: Diagnosis not present

## 2024-05-12 MED ORDER — CEFTRIAXONE SODIUM 1 G IJ SOLR
1.0000 g | Freq: Once | INTRAMUSCULAR | Status: AC
  Administered 2024-05-12: 1 g via INTRAMUSCULAR

## 2024-05-12 NOTE — Progress Notes (Signed)
 Per orders of Laine Balls, MD injection of Rocephin  1g given by Manuelita JAYSON Frost in L thigh. Patient tolerated injection well.

## 2024-05-13 ENCOUNTER — Ambulatory Visit (INDEPENDENT_AMBULATORY_CARE_PROVIDER_SITE_OTHER)

## 2024-05-13 ENCOUNTER — Telehealth: Payer: Self-pay

## 2024-05-13 DIAGNOSIS — N3000 Acute cystitis without hematuria: Secondary | ICD-10-CM | POA: Diagnosis not present

## 2024-05-13 MED ORDER — CEFTRIAXONE SODIUM 1 G IJ SOLR
1.0000 g | Freq: Once | INTRAMUSCULAR | Status: AC
  Administered 2024-05-13: 1 g via INTRAMUSCULAR

## 2024-05-13 NOTE — Telephone Encounter (Signed)
 Thanks for the heads up  Had no clue HH could not administer injections    Thanks for the help  If we cannot get them over the weekend then can we try for Monday and Tuesday?  How is she feeling

## 2024-05-13 NOTE — Telephone Encounter (Signed)
 The patient called in with the nurse from [facility] reporting that Home Health could not administer the Rocephin  injections. I spoke to Eunice who can be reached at [phone] who advised that they can only teach the patient's family members to administer the injection but not do the injection themselves. The patient reports that she could come in for the injection but questioned how she would get them on the weekend. Please reach out to the patient to advise.

## 2024-05-13 NOTE — Progress Notes (Signed)
 Per orders of Mallie Gaskins, NP in the absence of Laine Balls, MD injection of Rocephin  1g given by Harlene Arenas, CMA in R thigh. Patient tolerated injection well.

## 2024-05-13 NOTE — Telephone Encounter (Signed)
 Pt is coming in today and tomorrow to get the Rocephin  shots here, Joellen is working on a plan for the last 2 injections over the weekend

## 2024-05-14 ENCOUNTER — Ambulatory Visit: Admitting: *Deleted

## 2024-05-14 DIAGNOSIS — N3000 Acute cystitis without hematuria: Secondary | ICD-10-CM | POA: Diagnosis not present

## 2024-05-14 MED ORDER — CEFTRIAXONE SODIUM 1 G IJ SOLR
1.0000 g | Freq: Once | INTRAMUSCULAR | Status: AC
  Administered 2024-05-14: 1 g via INTRAMUSCULAR

## 2024-05-14 NOTE — Progress Notes (Signed)
 Per orders of Dr. Laine Balls, injection of Rocephin  1g given by Rayleen Forts M in Left quadriceps Patient tolerated injection well. Patient will make appointment for 1 day(s)

## 2024-05-15 ENCOUNTER — Ambulatory Visit

## 2024-05-15 DIAGNOSIS — N3000 Acute cystitis without hematuria: Secondary | ICD-10-CM | POA: Diagnosis not present

## 2024-05-16 ENCOUNTER — Ambulatory Visit

## 2024-05-16 DIAGNOSIS — N3000 Acute cystitis without hematuria: Secondary | ICD-10-CM | POA: Diagnosis not present

## 2024-05-17 ENCOUNTER — Ambulatory Visit: Admitting: Family Medicine

## 2024-05-17 DIAGNOSIS — R809 Proteinuria, unspecified: Secondary | ICD-10-CM | POA: Diagnosis not present

## 2024-05-17 DIAGNOSIS — I10 Essential (primary) hypertension: Secondary | ICD-10-CM | POA: Diagnosis not present

## 2024-05-17 DIAGNOSIS — E1169 Type 2 diabetes mellitus with other specified complication: Secondary | ICD-10-CM | POA: Diagnosis not present

## 2024-05-17 DIAGNOSIS — E785 Hyperlipidemia, unspecified: Secondary | ICD-10-CM | POA: Diagnosis not present

## 2024-05-17 DIAGNOSIS — Z1331 Encounter for screening for depression: Secondary | ICD-10-CM | POA: Diagnosis not present

## 2024-05-17 DIAGNOSIS — E1129 Type 2 diabetes mellitus with other diabetic kidney complication: Secondary | ICD-10-CM | POA: Diagnosis not present

## 2024-05-17 DIAGNOSIS — E1165 Type 2 diabetes mellitus with hyperglycemia: Secondary | ICD-10-CM | POA: Diagnosis not present

## 2024-05-17 DIAGNOSIS — N184 Chronic kidney disease, stage 4 (severe): Secondary | ICD-10-CM | POA: Diagnosis not present

## 2024-05-17 DIAGNOSIS — Z794 Long term (current) use of insulin: Secondary | ICD-10-CM | POA: Diagnosis not present

## 2024-05-17 DIAGNOSIS — E1122 Type 2 diabetes mellitus with diabetic chronic kidney disease: Secondary | ICD-10-CM | POA: Diagnosis not present

## 2024-05-17 NOTE — Telephone Encounter (Signed)
 Pt was able to get all shots, problem resolved

## 2024-05-18 ENCOUNTER — Encounter: Payer: Self-pay | Admitting: Family Medicine

## 2024-05-18 ENCOUNTER — Ambulatory Visit: Payer: Self-pay | Admitting: Family Medicine

## 2024-05-18 ENCOUNTER — Ambulatory Visit: Admitting: Family Medicine

## 2024-05-18 VITALS — BP 136/60 | HR 70 | Temp 98.7°F | Ht 61.0 in | Wt 149.5 lb

## 2024-05-18 DIAGNOSIS — L89159 Pressure ulcer of sacral region, unspecified stage: Secondary | ICD-10-CM | POA: Insufficient documentation

## 2024-05-18 DIAGNOSIS — M5481 Occipital neuralgia: Secondary | ICD-10-CM

## 2024-05-18 DIAGNOSIS — N184 Chronic kidney disease, stage 4 (severe): Secondary | ICD-10-CM

## 2024-05-18 DIAGNOSIS — N179 Acute kidney failure, unspecified: Secondary | ICD-10-CM

## 2024-05-18 DIAGNOSIS — I10 Essential (primary) hypertension: Secondary | ICD-10-CM

## 2024-05-18 DIAGNOSIS — N3000 Acute cystitis without hematuria: Secondary | ICD-10-CM | POA: Diagnosis not present

## 2024-05-18 DIAGNOSIS — L89156 Pressure-induced deep tissue damage of sacral region: Secondary | ICD-10-CM

## 2024-05-18 LAB — POC URINALSYSI DIPSTICK (AUTOMATED)
Bilirubin, UA: NEGATIVE
Blood, UA: NEGATIVE
Glucose, UA: POSITIVE — AB
Ketones, UA: NEGATIVE
Nitrite, UA: NEGATIVE
Protein, UA: POSITIVE — AB
Spec Grav, UA: 1.015 (ref 1.010–1.025)
Urobilinogen, UA: 0.2 U/dL
pH, UA: 6 (ref 5.0–8.0)

## 2024-05-18 MED ORDER — CEFTRIAXONE SODIUM 1 G IJ SOLR
1.0000 g | Freq: Once | INTRAMUSCULAR | Status: AC
  Administered 2024-05-15: 11:00:00 1 g via INTRAMUSCULAR

## 2024-05-18 MED ORDER — CEFTRIAXONE SODIUM 1 G IJ SOLR
1.0000 g | Freq: Once | INTRAMUSCULAR | Status: AC
  Administered 2024-05-16: 13:00:00 1 g via INTRAMUSCULAR

## 2024-05-18 NOTE — Assessment & Plan Note (Signed)
 Does feel significantly better after 7 d of IM ceftriaxone  Some dyruria returned  Urinalysis and culture pending   E coli last culture  Resistant to  Augmentin  Cefazolin  Trimeth/sulfa  Intermediate sens to macrobid and quinolones   Sensitive to ceftriaxone  Also IV meds- pip/tazo, gent   Also at risk for yeast vaginitis

## 2024-05-18 NOTE — Progress Notes (Signed)
 Subjective:    Patient ID: Morgan Hubbard, female    DOB: [birth year], 89 y.o.   MRN: [id number]  HPI  Wt Readings from Last 3 Encounters:  05/18/24 149 lb 8 oz (67.8 kg)  05/05/24 142 lb 4 oz (64.5 kg)  04/29/24 142 lb (64.4 kg)   28.25 kg/m  Vitals:   05/18/24 1353 05/18/24 1419  BP: (!) 150/60 136/60  Pulse: 70   Temp: 98.7 F (37.1 C)   SpO2: 96%      Pt presents for follow up of uti /urinary symptoms  Some pain on bottom  Occipital neuralgia     Last urine culture :  Escherichia coli Abnormal    Comment: Greater than 100,000 CFU/mL of Escherichia coli  Resulting Agency [facility]     Susceptibility   Escherichia coli    URINE CULTURE, REFLEX    AMOX/CLAVULANIC >=32 Resistant    AMPICILLIN /SULBACTAM 8 Sensitive    CEFAZOLIN  >=32 Resistant 1    CEFEPIME  <=0.12 Sensitive    CEFTAZIDIME 8 Intermediate    CEFTRIAXONE  <=0.25 Sensitive    CIPROFLOXACIN  0.5 Intermediate    GENTAMICIN <=1 Sensitive    IMIPENEM <=0.25 Sensitive    LEVOFLOXACIN  1 Intermediate    MEROPENEM <=0.25 Sensitive    NITROFURANTOIN 64 Intermediate    PIP/TAZO <=4 Sensitive    TRIMETH/SULFA >=320 Resistant 2      Resistant to  Augmentin  Cefazolin  Trimeth/sulfa  Intermediate sens to macrobid and quinolones   Sensitive to ceftriaxone  Also IV meds- pip/tazo, gent   We were able to treat for 7 d with IM ceftriaxone  - came to office/unfortunately home health could not admin this for unknown reason   Symptoms improved- including burning to urinate  Frequency /urgency - fair  Sick feeling is improved    Urinalysis today Results for orders placed or performed in visit on 05/18/24  POCT Urinalysis Dipstick (Automated)   Collection Time: 05/18/24  2:48 PM  Result Value Ref Range   Color, UA Yellow    Clarity, UA Clear    Glucose, UA Positive (A) Negative   Bilirubin, UA Negative    Ketones, UA Negative    Spec Grav, UA 1.015 1.010 - 1.025   Blood, UA Negative     pH, UA 6.0 5.0 - 8.0   Protein, UA Positive (A) Negative   Urobilinogen, UA 0.2 0.2 or 1.0 E.U./dL   Nitrite, UA Negative    Leukocytes, UA Small (1+) (A) Negative     Saw endo recently and DM is not well controlled   Has CKD Sees nephrology Lab Results  Component Value Date   NA 138 05/05/2024   K 4.6 05/05/2024   CO2 29 05/05/2024   GLUCOSE 454 (H) 05/05/2024   BUN 46 (H) 05/05/2024   CREATININE 1.83 (H) 05/05/2024   CALCIUM  8.5 05/05/2024   GFR 24.55 (L) 05/05/2024   EGFR 34 (L) 02/17/2024   GFRNONAA 18 (L) 04/29/2024   Saw nephrology last week   Lab Results  Component Value Date   WBC 11.2 (H) 05/05/2024   HGB 11.5 (L) 05/05/2024   HCT 34.7 (L) 05/05/2024   MCV 85.6 05/05/2024   PLT 226.0 05/05/2024   Lab Results  Component Value Date   IRON  65 05/05/2024   FERRITIN 26.8 05/05/2024      Soreness at end of spine Hurts to sit  Does not think it is hemorrhoids  Started one day last week    Has appointment with neurology  Not until march  Would like to change the appointment with Dr Maree  Did try taking gabapentin  more often -not making a difference     Patient Active Problem List   Diagnosis Date Noted   Sacral decubitus ulcer 05/18/2024   Polyp of descending colon 02/20/2024   Angiodysplasia of colon 02/20/2024   History of GI bleed 02/18/2024   CKD (chronic kidney disease) stage 4, GFR 15-29 ml/min (HCC) 02/18/2024   Perioral dermatitis 01/15/2024   Pruritus 10/08/2023   Poor balance 07/09/2023   Current use of proton pump inhibitor 03/24/2023   Rectal bleeding 02/17/2023   Senile purpura 01/20/2023   Pain due to onychomycosis of toenails of both feet 04/04/2022   Pre-syncope 11/29/2021   Hypotension 11/29/2021   History of breast cancer 06/18/2020   Symptomatic anemia 06/16/2020   Elevated TSH 06/16/2020   Occipital neuralgia 02/14/2020   Hair loss 11/09/2018   Leg weakness, bilateral 06/29/2018   Dysuria 06/29/2018   Pedal edema  06/22/2018   AICD (automatic cardioverter/defibrillator) present 06/11/2018   GERD (gastroesophageal reflux disease) 06/10/2018   AKI (acute kidney injury) 06/10/2018   Chronic back pain 05/23/2017   Chronic systolic (congestive) heart failure (HCC) 02/25/2017   Osteoporosis 06/16/2016   Routine general medical examination at a health care facility 04/24/2016   Estrogen deficiency 04/24/2016   UTI (urinary tract infection) 03/01/2014   Stroke, small vessel (HCC) 01/18/2014   Family history of hemochromatosis 01/18/2014   Nonischemic cardiomyopathy (HCC) 04/05/2013   Elevated transaminase level 05/18/2012   Spinal stenosis of lumbar region 12/10/2011   Mixed incontinence urge and stress 12/10/2011   Goiter 01/14/2011   Fatty liver 08/28/2010   Type II diabetes mellitus with renal manifestations (HCC) 07/09/2010   Hyperlipidemia associated with type 2 diabetes mellitus (HCC) 07/09/2010   Hyperkalemia 07/09/2010   Reflex sympathetic dystrophy 07/09/2010   Neuropathy 07/09/2010   Essential hypertension 07/09/2010   CARDIOMYOPATHY 07/09/2010   OVERACTIVE BLADDER 07/09/2010   Past Medical History:  Diagnosis Date   Arthritis    hands (01/13/2014)   Basal cell carcinoma 01/2014   bridge of nose   Cardiac LV ejection fraction >40%    it was 43 last year (01/13/2014)   Chronic lower back pain    CKD (chronic kidney disease), stage III (HCC)    acute on chronic stage III/notes 06/10/2018   Colon polyps    Expressive aphasia    3 times in the last week (01/13/2014)   GERD (gastroesophageal reflux disease)    Goiter past remote   treated with RI   Hyperlipidemia    Hyperpotassemia    Hypertension    Hypertonicity of bladder    Inflammatory and toxic neuropathy, unspecified    LBBB (left bundle branch block)    Migraine    stopped many years ago (01/13/2014)   Mini stroke 01/2014   Other primary cardiomyopathies    Overactive bladder    Presence of combination internal  cardiac defibrillator (ICD) and pacemaker    Reflex sympathetic dystrophy, unspecified    Shingles    Shortness of breath    ambulation   Stroke (HCC)    Thyroid  disease    Type II diabetes mellitus (HCC)    Ulcer    Urine incontinence    Vertigo    hx of   Past Surgical History:  Procedure Laterality Date   BACK SURGERY     BI-VENTRICULAR PACEMAKER INSERTION (CRT-P)  10/2014   [facility]   BREAST CYST  EXCISION Left 1959   CARDIAC CATHETERIZATION  several   [place]   CATARACT EXTRACTION W/ INTRAOCULAR LENS IMPLANT Left ~ 2005   several eye injections   CATARACT EXTRACTION W/PHACO Right 05/04/2020   Procedure: CATARACT EXTRACTION PHACO AND INTRAOCULAR LENS PLACEMENT (IOC) RIGHT DIABETIC;  Surgeon: Jaye Fallow, MD;  Location: [facility] ORS;  Service: Ophthalmology;  Laterality: Right;  US  00:58.5 CDE 6.70 Fluid Pack [place] # [id number]   CHOLECYSTECTOMY N/A 06/13/2018   Procedure: LAPAROSCOPIC CHOLECYSTECTOMY;  Surgeon: Belinda Cough, MD;  Location: [facility] OR;  Service: General;  Laterality: N/A;   COLONOSCOPY  7/12   normal (hx of polyps in past)    COLONOSCOPY N/A 02/20/2024   Procedure: COLONOSCOPY;  Surgeon: Jinny Carmine, MD;  Location: [facility] ENDOSCOPY;  Service: Endoscopy;  Laterality: N/A;   CT SCAN  3/12   outside hosp- lung nodule and gallstones   ERCP N/A 06/12/2018   Procedure: ENDOSCOPIC RETROGRADE CHOLANGIOPANCREATOGRAPHY (ERCP);  Surgeon: Rollin Dover, MD;  Location: [facility] ENDOSCOPY;  Service: Endoscopy;  Laterality: N/A;   ESOPHAGOGASTRODUODENOSCOPY N/A 02/20/2024   Procedure: EGD (ESOPHAGOGASTRODUODENOSCOPY);  Surgeon: Jinny Carmine, MD;  Location: [facility];  Service: Endoscopy;  Laterality: N/A;   FINGER SURGERY Left 1959 & 1976   knot on index   KIDNEY DONATION Left 1989   LUMBAR LAMINECTOMY/DECOMPRESSION MICRODISCECTOMY  1999   LUMBAR LAMINECTOMY/DECOMPRESSION MICRODISCECTOMY  01/31/2012   Procedure: LUMBAR LAMINECTOMY/DECOMPRESSION MICRODISCECTOMY 2 LEVELS;  Surgeon:  Alm GORMAN Molt, MD;  Location: [facility] NEURO ORS;  Service: Neurosurgery;  Laterality: N/A;  Thoracic twelve-lumbar one, lumbar one-two laminectomy    POSTERIOR LAMINECTOMY / DECOMPRESSION CERVICAL SPINE  1999   REMOVAL OF STONES  06/12/2018   Procedure: REMOVAL OF STONES;  Surgeon: Rollin Dover, MD;  Location: [facility] ENDOSCOPY;  Service: Endoscopy;;   SPHINCTEROTOMY  06/12/2018   Procedure: ANNETT;  Surgeon: Rollin Dover, MD;  Location: [facility] ENDOSCOPY;  Service: Endoscopy;;   TUBAL LIGATION  1976   UPPER GASTROINTESTINAL ENDOSCOPY     Social History[1] Family History  Problem Relation Age of Onset   Arthritis Mother    Cancer Mother        uterine and mouth   Hyperlipidemia Mother    Stroke Mother    Hypertension Mother    Alcohol abuse Father    Diabetes Father    Cancer Sister        breast   Diabetes Sister    Hyperlipidemia Sister    Heart disease Sister    Hypertension Sister    Diabetes Sister    Cancer Brother        lung cancer   Kidney disease Brother    Diabetes Brother    Hyperlipidemia Brother    Kidney failure Brother    Diabetes Daughter    Heart attack Daughter    Addison's disease Other    Allergies[2] Medications Ordered Prior to Encounter[2]  Review of Systems  Constitutional:  Negative for activity change, appetite change, fatigue, fever and unexpected weight change.       Fatigue /malaise are improved   HENT:  Negative for congestion, ear pain, rhinorrhea, sinus pressure and sore throat.   Eyes:  Negative for pain, redness and visual disturbance.  Respiratory:  Negative for cough, shortness of breath and wheezing.   Cardiovascular:  Negative for chest pain and palpitations.  Gastrointestinal:  Negative for abdominal pain, blood in stool, constipation and diarrhea.  Endocrine: Negative for polydipsia and polyuria.  Genitourinary:  Negative for dysuria, frequency and urgency.  Musculoskeletal:  Negative for arthralgias, back pain and myalgias.  Skin:   Negative for pallor and rash.       Pain/tenderness in skin over tail bone   Allergic/Immunologic: Negative for environmental allergies.  Neurological:  Negative for dizziness, syncope and headaches.  Hematological:  Negative for adenopathy. Does not bruise/bleed easily.  Psychiatric/Behavioral:  Negative for decreased concentration and dysphoric mood. The patient is not nervous/anxious.        Objective:   Physical Exam Constitutional:      General: She is not in acute distress.    Appearance: Normal appearance. She is well-developed and normal weight. She is not ill-appearing or diaphoretic.  HENT:     Head: Normocephalic and atraumatic.  Eyes:     Conjunctiva/sclera: Conjunctivae normal.     Pupils: Pupils are equal, round, and reactive to light.  Neck:     Thyroid : No thyromegaly.     Vascular: No carotid bruit or JVD.  Cardiovascular:     Rate and Rhythm: Normal rate and regular rhythm.     Heart sounds: Normal heart sounds.     No gallop.  Pulmonary:     Effort: Pulmonary effort is normal. No respiratory distress.     Breath sounds: Normal breath sounds. No wheezing or rales.  Abdominal:     General: There is no distension or abdominal bruit.     Palpations: Abdomen is soft.     Tenderness: There is no abdominal tenderness. There is no right CVA tenderness or left CVA tenderness.     Comments: No suprapubic tenderness or fullness     Musculoskeletal:     Cervical back: Normal range of motion and neck supple.     Right lower leg: No edema.     Left lower leg: No edema.  Lymphadenopathy:     Cervical: No cervical adenopathy.  Skin:    General: Skin is warm and dry.     Coloration: Skin is not pale.     Findings: No rash.     Comments: Area of erythema (non blanchable)  and very slight skin peeling over middle lower sacrum (1 cm)  Mild tenderness  No fluctuance or swelling    Neurological:     Mental Status: She is alert.     Cranial Nerves: No cranial nerve  deficit.     Motor: No weakness.     Coordination: Coordination normal.     Deep Tendon Reflexes: Reflexes are normal and symmetric. Reflexes normal.     Comments: No focal weakness Can walk unassisted with cane and get up from chair   Balance is better than last visit   Psychiatric:        Mood and Affect: Mood normal.           Assessment & Plan:   Problem List Items Addressed This Visit       Cardiovascular and Mediastinum   Essential hypertension - Primary   bp in fair control at this time  BP Readings from Last 1 Encounters:  05/18/24 136/60   No changes needed Most recent labs reviewed  Disc lifstyle change with low sodium diet and exercise  Low gfr   Carvedilol  12.5 mg bid Isosorbide  30 mg daily  Lasix  20 mg daily (change to prn)  Amlodipione 2.5 mg daily  Blood pressure has been labile in the past         Nervous and Auditory   Occipital neuralgia   Pt changed mind and wants to see  Dr Maree for neurology  I will re place the referral   Symptoms are stable Going up on gabapentin  did not help-will drop down to original dose         Genitourinary   UTI (urinary tract infection)   Does feel significantly better after 7 d of IM ceftriaxone  Some dyruria returned  Urinalysis and culture pending   E coli last culture  Resistant to  Augmentin  Cefazolin  Trimeth/sulfa  Intermediate sens to macrobid and quinolones   Sensitive to ceftriaxone  Also IV meds- pip/tazo, gent   Also at risk for yeast vaginitis       Relevant Orders   POCT Urinalysis Dipstick (Automated) (Completed)   Urine Culture   CKD (chronic kidney disease) stage 4, GFR 15-29 ml/min (HCC)   Has nephrology visit next week last GFR improved to 24.5  Did treat uti with ceftriaxone         AKI (acute kidney injury)   Did improve No doubt uti played a role /treated with rocephin   Had visit with nephrology last wk        Other   Sacral decubitus ulcer   Pre ulcer/mild on exam    Will try barrier cream and off loading area See AVS   Update if not starting to improve in a week or if worsening   Call back and Er precautions noted in detail today           [1]  Social History Tobacco Use   Smoking status: Never   Smokeless tobacco: Never  Vaping Use   Vaping status: Never Used  Substance Use Topics   Alcohol use: No    Alcohol/week: 0.0 standard drinks of alcohol   Drug use: No  [2]  Allergies Allergen Reactions   Ace Inhibitors Swelling    REACTION: tongue swelling   Lisinopril Swelling    Swelling of tongue   Insulin  Detemir Itching   Nsaids Other (See Comments)    Renal issues   Codeine Nausea Only   Hydrochlorothiazide Other (See Comments)    Dizziness/funny feeling   Tylenol  [Acetaminophen ] Other (See Comments)    Fatty liver - use with caution  [2]  Current Outpatient Medications on File Prior to Visit  Medication Sig Dispense Refill   amLODipine  (NORVASC ) 2.5 MG tablet Take 2.5 mg by mouth at bedtime.     ascorbic acid (VITAMIN C) 500 MG tablet Take 500 mg by mouth daily.     atorvastatin  (LIPITOR ) 80 MG tablet Take 1 tablet by mouth once daily 90 tablet 3   carvedilol  (COREG ) 12.5 MG tablet TAKE 1 TABLET BY MOUTH TWICE DAILY WITH A MEAL 180 tablet 3   cetirizine (ZYRTEC) 10 MG tablet Take 10 mg by mouth daily.     Continuous Glucose Sensor (FREESTYLE LIBRE 3 SENSOR) MISC USE TO CHECK GLUCOSE CONTINOUSLY. CHANGE EVERY 14 DAYS 6 each 0   dapagliflozin  propanediol (FARXIGA ) 5 MG TABS tablet Take 5 mg by mouth daily.     famotidine  (PEPCID ) 20 MG tablet Take 1 tablet by mouth once daily 90 tablet 0   furosemide  (LASIX ) 20 MG tablet Take 1 tablet (20 mg total) by mouth daily as needed.     gabapentin  (NEURONTIN ) 300 MG capsule TAKE 1 CAPSULE BY MOUTH THREE TIMES DAILY 270 capsule 2   hydrocortisone  2.5 % cream Apply topically 3 (three) times daily as needed. 28 g 3   insulin  degludec (TRESIBA) 100 UNIT/ML FlexTouch Pen Inject 26 Units  into the skin at  bedtime.     isosorbide  mononitrate (IMDUR ) 30 MG 24 hr tablet Take 1 tablet (30 mg total) by mouth daily. 90 tablet 3   LORazepam  (ATIVAN ) 0.5 MG tablet Take 1 tablet by mouth every 8 (eight) hours as needed.     nystatin cream (MYCOSTATIN) Apply 1 application topically 2 (two) times daily as needed (irritation.).      ondansetron  (ZOFRAN ) 4 MG tablet Take 1 tablet (4 mg total) by mouth every 8 (eight) hours as needed for nausea or vomiting. Caution of sedation 12 tablet 0   pantoprazole  (PROTONIX ) 40 MG tablet Take 1 tablet (40 mg total) by mouth 2 (two) times daily before a meal. 90 tablet 3   Respiratory Therapy Supplies (NEBULIZER/TUBING/MOUTHPIECE) KIT 1 each by Does not apply route 3 (three) times daily as needed. 1 kit 0   Rivaroxaban (XARELTO) 15 MG TABS tablet Take 15 mg by mouth daily with supper.     traMADol  (ULTRAM ) 50 MG tablet Take 1-2 tablets (50-100 mg total) by mouth every 6 (six) hours as needed for severe pain (pain score 7-10). 60 tablet 0   triamcinolone  cream (KENALOG ) 0.1 % Apply 1 Application topically 2 (two) times daily as needed. 45 g 1   TRULICITY 1.5 MG/0.5ML SOAJ Inject into the skin once a week.     TURMERIC PO Take 1,000 mg by mouth daily.     vitamin B-12 (CYANOCOBALAMIN ) 1000 MCG tablet Take 500 mcg by mouth daily.     No current facility-administered medications on file prior to visit.

## 2024-05-18 NOTE — Assessment & Plan Note (Signed)
 Pt changed mind and wants to see Dr Maree for neurology  I will re place the referral   Symptoms are stable Going up on gabapentin  did not help-will drop down to original dose

## 2024-05-18 NOTE — Assessment & Plan Note (Signed)
 Did improve No doubt uti played a role /treated with rocephin   Had visit with nephrology last wk

## 2024-05-18 NOTE — Assessment & Plan Note (Addendum)
 Has nephrology visit next week last GFR improved to 24.5  Did treat uti with ceftriaxone 

## 2024-05-18 NOTE — Assessment & Plan Note (Signed)
 Pre ulcer/mild on exam   Will try barrier cream and off loading area See AVS   Update if not starting to improve in a week or if worsening   Call back and Er precautions noted in detail today

## 2024-05-18 NOTE — Assessment & Plan Note (Signed)
 bp in fair control at this time  BP Readings from Last 1 Encounters:  05/18/24 136/60   No changes needed Most recent labs reviewed  Disc lifstyle change with low sodium diet and exercise  Low gfr   Carvedilol  12.5 mg bid Isosorbide  30 mg daily  Lasix  20 mg daily (change to prn)  Amlodipione 2.5 mg daily  Blood pressure has been labile in the past

## 2024-05-18 NOTE — Progress Notes (Signed)
 Per orders of Dr. Laine Balls, injection of Rocephin   given by Ruel Dimmick Y Wilsie Kern in Right vastus lateralis Patient tolerated injection well. Patient will make appointment for 1 day(s)

## 2024-05-18 NOTE — Progress Notes (Signed)
 Per orders of Dr. Laine Balls, injection of Rocephin   given by Danna CINDERELLA Hummer in Left vastus lateralis Patient tolerated injection well. Patient will make appointment to follow up with Dr. Balls if symptoms continue.

## 2024-05-18 NOTE — Patient Instructions (Addendum)
 You have an irritated area on your sacrum (tail bone area)  Keep it very clean with soap and water  Dry well  Apply a barrier cream over the counter like desitin or and D ointment   Also you may need to switch from pads (looks like your current pad is rubbing this spot raw) to an adult under garment for a while   Consider getting a donut shaped pillow to sit on that will off load the sacral area so you don't get an ulcer   Keep us  posted   Try and stay hydrated  Let's get a urine sample for urinalysis and culture   We will reach out with result and plan   I will work on a referral to Dr Maree with neurology  Please let us  know if you don't hear in 1-2 weeks to set that up (mychart message or call or letter)  If your head pain worsens let us  know in the meantime

## 2024-05-19 LAB — URINE CULTURE
MICRO NUMBER:: 17362152
Result:: NO GROWTH
SPECIMEN QUALITY:: ADEQUATE

## 2024-05-20 MED ORDER — TERCONAZOLE 0.8 % VA CREA: 1.0000 | TOPICAL_CREAM | Freq: Every day | VAGINAL | 0 refills | Status: DC

## 2024-05-20 NOTE — Addendum Note (Signed)
 Addended by: RAYLEEN GLENDORA HERO on: 05/20/2024 03:59 PM   Modules accepted: Orders

## 2024-06-01 ENCOUNTER — Other Ambulatory Visit: Payer: Self-pay | Admitting: Family Medicine

## 2024-06-07 ENCOUNTER — Ambulatory Visit: Admitting: Family Medicine

## 2024-06-07 ENCOUNTER — Ambulatory Visit: Payer: Self-pay | Admitting: Family Medicine

## 2024-06-07 ENCOUNTER — Encounter: Payer: Self-pay | Admitting: Family Medicine

## 2024-06-07 VITALS — BP 118/62 | HR 81 | Temp 99.2°F | Ht 61.0 in | Wt 150.0 lb

## 2024-06-07 DIAGNOSIS — I10 Essential (primary) hypertension: Secondary | ICD-10-CM | POA: Diagnosis not present

## 2024-06-07 DIAGNOSIS — Z8673 Personal history of transient ischemic attack (TIA), and cerebral infarction without residual deficits: Secondary | ICD-10-CM

## 2024-06-07 DIAGNOSIS — Z0000 Encounter for general adult medical examination without abnormal findings: Secondary | ICD-10-CM

## 2024-06-07 DIAGNOSIS — E1129 Type 2 diabetes mellitus with other diabetic kidney complication: Secondary | ICD-10-CM

## 2024-06-07 DIAGNOSIS — Z79899 Other long term (current) drug therapy: Secondary | ICD-10-CM

## 2024-06-07 DIAGNOSIS — M810 Age-related osteoporosis without current pathological fracture: Secondary | ICD-10-CM

## 2024-06-07 DIAGNOSIS — D649 Anemia, unspecified: Secondary | ICD-10-CM

## 2024-06-07 DIAGNOSIS — I4891 Unspecified atrial fibrillation: Secondary | ICD-10-CM

## 2024-06-07 DIAGNOSIS — E785 Hyperlipidemia, unspecified: Secondary | ICD-10-CM | POA: Diagnosis not present

## 2024-06-07 DIAGNOSIS — Z0001 Encounter for general adult medical examination with abnormal findings: Secondary | ICD-10-CM

## 2024-06-07 DIAGNOSIS — K219 Gastro-esophageal reflux disease without esophagitis: Secondary | ICD-10-CM

## 2024-06-07 DIAGNOSIS — J069 Acute upper respiratory infection, unspecified: Secondary | ICD-10-CM | POA: Diagnosis not present

## 2024-06-07 DIAGNOSIS — K760 Fatty (change of) liver, not elsewhere classified: Secondary | ICD-10-CM | POA: Diagnosis not present

## 2024-06-07 DIAGNOSIS — I5022 Chronic systolic (congestive) heart failure: Secondary | ICD-10-CM

## 2024-06-07 DIAGNOSIS — N184 Chronic kidney disease, stage 4 (severe): Secondary | ICD-10-CM

## 2024-06-07 DIAGNOSIS — E1169 Type 2 diabetes mellitus with other specified complication: Secondary | ICD-10-CM

## 2024-06-07 DIAGNOSIS — Z8719 Personal history of other diseases of the digestive system: Secondary | ICD-10-CM

## 2024-06-07 DIAGNOSIS — G629 Polyneuropathy, unspecified: Secondary | ICD-10-CM

## 2024-06-07 DIAGNOSIS — N898 Other specified noninflammatory disorders of vagina: Secondary | ICD-10-CM

## 2024-06-07 DIAGNOSIS — M5481 Occipital neuralgia: Secondary | ICD-10-CM | POA: Diagnosis not present

## 2024-06-07 LAB — CBC WITH DIFFERENTIAL/PLATELET
Basophils Absolute: 0 K/uL (ref 0.0–0.1)
Basophils Relative: 0.4 % (ref 0.0–3.0)
Eosinophils Absolute: 0.4 K/uL (ref 0.0–0.7)
Eosinophils Relative: 4.2 % (ref 0.0–5.0)
HCT: 31.8 % — ABNORMAL LOW (ref 36.0–46.0)
Hemoglobin: 10.4 g/dL — ABNORMAL LOW (ref 12.0–15.0)
Lymphocytes Relative: 24.4 % (ref 12.0–46.0)
Lymphs Abs: 2.1 K/uL (ref 0.7–4.0)
MCHC: 32.6 g/dL (ref 30.0–36.0)
MCV: 84.3 fl (ref 78.0–100.0)
Monocytes Absolute: 0.7 K/uL (ref 0.1–1.0)
Monocytes Relative: 8.6 % (ref 3.0–12.0)
Neutro Abs: 5.3 K/uL (ref 1.4–7.7)
Neutrophils Relative %: 62.4 % (ref 43.0–77.0)
Platelets: 182 K/uL (ref 150.0–400.0)
RBC: 3.78 Mil/uL — ABNORMAL LOW (ref 3.87–5.11)
RDW: 15.5 % (ref 11.5–15.5)
WBC: 8.6 K/uL (ref 4.0–10.5)

## 2024-06-07 LAB — VITAMIN D 25 HYDROXY (VIT D DEFICIENCY, FRACTURES): VITD: 21 ng/mL — ABNORMAL LOW (ref 30.00–100.00)

## 2024-06-07 LAB — TSH: TSH: 3.84 u[IU]/mL (ref 0.35–5.50)

## 2024-06-07 LAB — COMPREHENSIVE METABOLIC PANEL WITH GFR
ALT: 8 U/L (ref 3–35)
AST: 17 U/L (ref 5–37)
Albumin: 3 g/dL — ABNORMAL LOW (ref 3.5–5.2)
Alkaline Phosphatase: 83 U/L (ref 39–117)
BUN: 25 mg/dL — ABNORMAL HIGH (ref 6–23)
CO2: 31 meq/L (ref 19–32)
Calcium: 7.9 mg/dL — ABNORMAL LOW (ref 8.4–10.5)
Chloride: 103 meq/L (ref 96–112)
Creatinine, Ser: 1.85 mg/dL — ABNORMAL HIGH (ref 0.40–1.20)
GFR: 24.21 mL/min — ABNORMAL LOW
Glucose, Bld: 192 mg/dL — ABNORMAL HIGH (ref 70–99)
Potassium: 5 meq/L (ref 3.5–5.1)
Sodium: 140 meq/L (ref 135–145)
Total Bilirubin: 0.5 mg/dL (ref 0.2–1.2)
Total Protein: 5.5 g/dL — ABNORMAL LOW (ref 6.0–8.3)

## 2024-06-07 LAB — LIPID PANEL
Cholesterol: 84 mg/dL (ref 28–200)
HDL: 27.7 mg/dL — ABNORMAL LOW
LDL Cholesterol: 37 mg/dL (ref 10–99)
NonHDL: 56.01
Total CHOL/HDL Ratio: 3
Triglycerides: 97 mg/dL (ref 10.0–149.0)
VLDL: 19.4 mg/dL (ref 0.0–40.0)

## 2024-06-07 LAB — VITAMIN B12: Vitamin B-12: 1380 pg/mL — ABNORMAL HIGH (ref 211–911)

## 2024-06-07 MED ORDER — FLUCONAZOLE 150 MG PO TABS: 150.0000 mg | ORAL_TABLET | Freq: Once | ORAL | 0 refills | Status: AC

## 2024-06-07 NOTE — Assessment & Plan Note (Signed)
 Symptoms come and go  Has neuro appointment in march  Continues gabapentin 

## 2024-06-07 NOTE — Assessment & Plan Note (Signed)
 D and B12 levels today  Continues to require protonix  40 mg bid

## 2024-06-07 NOTE — Assessment & Plan Note (Signed)
 Getting over this  Started with fever/better now  May have been flu  Reassuring lung exam   Call back and Er precautions noted in detail today

## 2024-06-07 NOTE — Assessment & Plan Note (Addendum)
 Dexa 12/2023  No recent falls or fractures  Recovering from acute illness (uti, then uri)   Unsure what treatment options she has given renal fxn  Lab pending-renal and vit D  Discussed fall prevention, supplements and exercise for bone density

## 2024-06-07 NOTE — Assessment & Plan Note (Signed)
 Under endocrinology care Labile glucose  Insulin  dependent  Trulicity Tresiba Farxiga 

## 2024-06-07 NOTE — Assessment & Plan Note (Signed)
 Continues  Protonix  40 mg bid Pepcid  20 mg daily   Instructed to avoid nsaids including asa in light of this , past GI bleed and current anticoag with xarelto

## 2024-06-07 NOTE — Patient Instructions (Addendum)
 You are due for a mammogram  Please call for that appointment   Make sure you get at least 2000 international units of vitamin D3 over the counter daily  That can be alone or combined with calcium    If you have pain or fever -take tylenol , not aspirin  or ibuprofen or naproxen   Take the diflucan  pill orally (just once) for yeast infection  If the vaginal itching is not better in 5 days let us  know    Take care of yourself   Once you get back to  normal , I want to discuss treatment options for osteoporosis   If cold and cough symptoms do not continue to improve please follow up

## 2024-06-07 NOTE — Progress Notes (Signed)
 "  Subjective:    Patient ID: Morgan Hubbard, female    DOB: 1935/03/17, 89 y.o.   MRN: [id number]  HPI  Here for health maintenance exam and to review chronic medical problems   Wt Readings from Last 3 Encounters:  06/07/24 150 lb (68 kg)  05/18/24 149 lb 8 oz (67.8 kg)  05/05/24 142 lb 4 oz (64.5 kg)   28.34 kg/m  Vitals:   06/07/24 1110 06/07/24 1136  BP: (!) 142/58 118/62  Pulse: 81   Temp: 99.2 F (37.3 C)   SpO2: 94%     Immunization History  Administered Date(s) Administered   Fluad Quad(high Dose 65+) 02/14/2020, 02/15/2021   INFLUENZA, HIGH DOSE SEASONAL PF 04/05/2024   Influenza,inj,Quad PF,6+ Mos 05/24/2015, 04/03/2016, 03/09/2018   Influenza-Unspecified 03/29/2013, 04/11/2014, 04/03/2016, 03/17/2017   PFIZER(Purple Top)SARS-COV-2 Vaccination 07/16/2019, 08/06/2019, 03/03/2020   Pneumococcal Conjugate-13 06/13/2014   Pneumococcal Polysaccharide-23 01/14/2011   Td 12/29/2023    There are no preventive care reminders to display for this patient.  Doing well Better after treatment of uti   Had illness last week  Had fever / better now  Did vomit once Coughed a bit last night   Yeast vaginal infection after her antibiotic for uti  Terazol did not help     Shingrix - declines   Mammogram 12/2022  Self breast exam-no lumps   Gyn health- vaginal itching    Colon cancer screening -out aged History of small bowel avm bleed in past   Bone health  Dexa 12/2023  osteoporosis  Falls- none  Fractures-none  Supplements -? If taking D   Exercise  Getting over illness   Mood    06/07/2024   11:25 AM 04/27/2024    9:55 AM 02/23/2024   11:53 AM 02/03/2024    3:03 PM 12/29/2023    2:46 PM  Depression screen PHQ 2/9  Decreased Interest 0 0 0 0 0  Down, Depressed, Hopeless 0 0 0 0 0  PHQ - 2 Score 0 0 0 0 0  Altered sleeping 0   0   Tired, decreased energy 0   0   Change in appetite 0   3   Feeling bad or failure about yourself  0   0   Trouble  concentrating 0   0   Moving slowly or fidgety/restless 0   0   Suicidal thoughts 0   0   PHQ-9 Score 0   3    Difficult doing work/chores Not difficult at all   Not difficult at all      Data saved with a previous flowsheet row definition    HTN bp is stable today  No cp or palpitations or headaches or edema  No side effects to medicines  BP Readings from Last 3 Encounters:  06/07/24 118/62  05/18/24 136/60  05/05/24 139/60     Lab Results  Component Value Date   NA 138 05/05/2024   K 4.6 05/05/2024   CO2 29 05/05/2024   GLUCOSE 454 (H) 05/05/2024   BUN 46 (H) 05/05/2024   CREATININE 1.83 (H) 05/05/2024   CALCIUM  8.5 05/05/2024   GFR 24.55 (L) 05/05/2024   EGFR 34 (L) 02/17/2024   GFRNONAA 18 (L) 04/29/2024   Carvedilol  12.5 mg bid Isosorbide  30 mg daily  Lasix  20 mg daily (change to prn)  Amlodipione 2.5 mg daily   Has [facility]ology appointment soon at [facility]   Ckd Treated uti with ceftriaxone  nina not sensitive to other  agents   Trying to drink water     GERD No symptoms / but does have hoarse voice  Protonix  40 mg bid  Pepcid  20 mg daily  Lab Results  Component Value Date   VITAMINB12 >1537 (H) 03/26/2023   Takes 500 mcg B12 daily   DM2 Lab Results  Component Value Date   HGBA1C 8.2 (H) 02/19/2024   HGBA1C 7.5 12/19/2022   HGBA1C 8.0 (H) 11/29/2021   Sees endocrinology  Insulin  dependent   Neuropathy  Gabapentin  300 mg tid  Also occipital neuralgia    Hyperlipidemia Lab Results  Component Value Date   CHOL 113 03/26/2023   HDL 33.00 (L) 03/26/2023   LDLCALC 48 03/26/2023   LDLDIRECT 46.0 06/12/2020   TRIG 161.0 (H) 03/26/2023   CHOLHDL 3 03/26/2023   Atorvastatin  80 mg daily  Lab Results  Component Value Date   ALT 13 04/29/2024   AST 26 04/29/2024   ALKPHOS 86 04/29/2024   BILITOT 0.6 04/29/2024     Anemia Lab Results  Component Value Date   WBC 11.2 (H) 05/05/2024   HGB 11.5 (L) 05/05/2024   HCT 34.7 (L) 05/05/2024    MCV 85.6 05/05/2024   PLT 226.0 05/05/2024   Lab Results  Component Value Date   IRON  65 05/05/2024   FERRITIN 26.8 05/05/2024     Patient Active Problem List   Diagnosis Date Noted   Atrial fibrillation, unspecified type (HCC) 06/07/2024   Polyp of descending colon 02/20/2024   Angiodysplasia of colon 02/20/2024   History of GI bleed 02/18/2024   CKD (chronic kidney disease) stage 4, GFR 15-29 ml/min (HCC) 02/18/2024   Perioral dermatitis 01/15/2024   Poor balance 07/09/2023   Current use of proton pump inhibitor 03/24/2023   Senile purpura 01/20/2023   Vaginal itching 05/23/2022   Pain due to onychomycosis of toenails of both feet 04/04/2022   History of breast cancer 06/18/2020   Symptomatic anemia 06/16/2020   Occipital neuralgia 02/14/2020   Hair loss 11/09/2018   Leg weakness, bilateral 06/29/2018   Pedal edema 06/22/2018   AICD (automatic cardioverter/defibrillator) present 06/11/2018   GERD (gastroesophageal reflux disease) 06/10/2018   Chronic back pain 05/23/2017   Chronic systolic (congestive) heart failure (HCC) 02/25/2017   Osteoporosis 06/16/2016   Routine general medical examination at a health care facility 04/24/2016   Viral URI with cough 02/16/2016   H/O: stroke 01/18/2014   Family history of hemochromatosis 01/18/2014   Nonischemic cardiomyopathy (HCC) 04/05/2013   Spinal stenosis of lumbar region 12/10/2011   Mixed incontinence urge and stress 12/10/2011   Goiter 01/14/2011   Fatty liver 08/28/2010   Type II diabetes mellitus with renal manifestations (HCC) 07/09/2010   Hyperlipidemia associated with type 2 diabetes mellitus (HCC) 07/09/2010   Hyperkalemia 07/09/2010   Reflex sympathetic dystrophy 07/09/2010   Neuropathy 07/09/2010   Essential hypertension 07/09/2010   CARDIOMYOPATHY 07/09/2010   OVERACTIVE BLADDER 07/09/2010   Past Medical History:  Diagnosis Date   Arthritis    hands (01/13/2014)   Basal cell carcinoma 01/2014   bridge  of nose   Cardiac LV ejection fraction >40%    it was 43 last year (01/13/2014)   Chronic lower back pain    CKD (chronic kidney disease), stage III (HCC)    acute on chronic stage III/notes 06/10/2018   Colon polyps    Expressive aphasia    3 times in the last week (01/13/2014)   GERD (gastroesophageal reflux disease)    Goiter past  remote   treated with RI   Hyperlipidemia    Hyperpotassemia    Hypertension    Hypertonicity of bladder    Inflammatory and toxic neuropathy, unspecified    LBBB (left bundle branch block)    Migraine    stopped many years ago (01/13/2014)   Mini stroke 01/2014   Other primary cardiomyopathies    Overactive bladder    Presence of combination internal cardiac defibrillator (ICD) and pacemaker    Reflex sympathetic dystrophy, unspecified    Shingles    Shortness of breath    ambulation   Stroke (HCC)    Thyroid  disease    Type II diabetes mellitus (HCC)    Ulcer    Urine incontinence    Vertigo    hx of   Past Surgical History:  Procedure Laterality Date   BACK SURGERY     BI-VENTRICULAR PACEMAKER INSERTION (CRT-P)  10/2014   [facility]   BREAST CYST EXCISION Left 1959   CARDIAC CATHETERIZATION  several   [place]   CATARACT EXTRACTION W/ INTRAOCULAR LENS IMPLANT Left ~ 2005   several eye injections   CATARACT EXTRACTION W/PHACO Right 05/04/2020   Procedure: CATARACT EXTRACTION PHACO AND INTRAOCULAR LENS PLACEMENT (IOC) RIGHT DIABETIC;  Surgeon: Jaye Fallow, MD;  Location: [facility] ORS;  Service: Ophthalmology;  Laterality: Right;  US  00:58.5 CDE 6.70 Fluid Pack [place] # [id number]   CHOLECYSTECTOMY N/A 06/13/2018   Procedure: LAPAROSCOPIC CHOLECYSTECTOMY;  Surgeon: Belinda Cough, MD;  Location: [facility] OR;  Service: General;  Laterality: N/A;   COLONOSCOPY  7/12   normal (hx of polyps in past)    COLONOSCOPY N/A 02/20/2024   Procedure: COLONOSCOPY;  Surgeon: Jinny Carmine, MD;  Location: [facility] ENDOSCOPY;  Service: Endoscopy;  Laterality: N/A;    CT SCAN  3/12   outside hosp- lung nodule and gallstones   ERCP N/A 06/12/2018   Procedure: ENDOSCOPIC RETROGRADE CHOLANGIOPANCREATOGRAPHY (ERCP);  Surgeon: Rollin Dover, MD;  Location: [facility] ENDOSCOPY;  Service: Endoscopy;  Laterality: N/A;   ESOPHAGOGASTRODUODENOSCOPY N/A 02/20/2024   Procedure: EGD (ESOPHAGOGASTRODUODENOSCOPY);  Surgeon: Jinny Carmine, MD;  Location: [facility] ENDOSCOPY;  Service: Endoscopy;  Laterality: N/A;   FINGER SURGERY Left 1959 & 1976   knot on index   KIDNEY DONATION Left 1989   LUMBAR LAMINECTOMY/DECOMPRESSION MICRODISCECTOMY  1999   LUMBAR LAMINECTOMY/DECOMPRESSION MICRODISCECTOMY  01/31/2012   Procedure: LUMBAR LAMINECTOMY/DECOMPRESSION MICRODISCECTOMY 2 LEVELS;  Surgeon: Alm GORMAN Molt, MD;  Location: [facility] NEURO ORS;  Service: Neurosurgery;  Laterality: N/A;  Thoracic twelve-lumbar one, lumbar one-two laminectomy    POSTERIOR LAMINECTOMY / DECOMPRESSION CERVICAL SPINE  1999   REMOVAL OF STONES  06/12/2018   Procedure: REMOVAL OF STONES;  Surgeon: Rollin Dover, MD;  Location: [facility] ENDOSCOPY;  Service: Endoscopy;;   SPHINCTEROTOMY  06/12/2018   Procedure: ANNETT;  Surgeon: Rollin Dover, MD;  Location: [facility] ENDOSCOPY;  Service: Endoscopy;;   TUBAL LIGATION  1976   UPPER GASTROINTESTINAL ENDOSCOPY     Social History[1] Family History  Problem Relation Age of Onset   Arthritis Mother    Cancer Mother        uterine and mouth   Hyperlipidemia Mother    Stroke Mother    Hypertension Mother    Alcohol abuse Father    Diabetes Father    Cancer Sister        breast   Diabetes Sister    Hyperlipidemia Sister    Heart disease Sister    Hypertension Sister    Diabetes Sister    Cancer  Brother        lung cancer   Kidney disease Brother    Diabetes Brother    Hyperlipidemia Brother    Kidney failure Brother    Diabetes Daughter    Heart attack Daughter    Addison's disease Other    Allergies[2] Medications Ordered Prior to Encounter[3]  Review of  Systems     Objective:   Physical Exam        Assessment & Plan:   Problem List Items Addressed This Visit       Cardiovascular and Mediastinum   Essential hypertension   bp in fair control at this time  BP Readings from Last 1 Encounters:  06/07/24 118/62   No changes needed Most recent labs reviewed  Disc lifstyle change with low sodium diet and exercise  Low gfr   Carvedilol  12.5 mg bid Isosorbide  30 mg daily  Lasix  20 mg daily (change to prn)  Amlodipione 2.5 mg daily  Blood pressure has been labile in the past       Relevant Orders   TSH   Lipid panel   Comprehensive metabolic panel with GFR   CBC with Differential/Platelet   Chronic systolic (congestive) heart failure (HCC)   No clinical changes Under care of cardiology  Carvedilol  12.5 mg bid       Atrial fibrillation, unspecified type (HCC)   Under cardiology and EP care  With CHF On xarelto           Respiratory   Viral URI with cough   Getting over this  Started with fever/better now  May have been flu  Reassuring lung exam   Call back and Er precautions noted in detail today        Relevant Medications   fluconazole  (DIFLUCAN ) 150 MG tablet     Digestive   GERD (gastroesophageal reflux disease)   Continues  Protonix  40 mg bid Pepcid  20 mg daily   Instructed to avoid nsaids including asa in light of this , past GI bleed and current anticoag with xarelto      Fatty liver   Lab today       Relevant Orders   Comprehensive metabolic panel with GFR     Endocrine   Type II diabetes mellitus with renal manifestations (HCC)   Under endocrinology care Labile glucose  Insulin  dependent  Trulicity Tresiba Farxiga        Hyperlipidemia associated with type 2 diabetes mellitus (HCC)   Disc goals for lipids and reasons to control them Rev last labs with pt Rev low sat fat diet in detail Continues atorvastatin  80 mg daily   Lab today       Relevant Orders   Lipid panel      Nervous and Auditory   Occipital neuralgia   Symptoms come and go  Has neuro appointment in march  Continues gabapentin        Neuropathy   Continues gabapentin          Musculoskeletal and Integument   Osteoporosis   Dexa 12/2023  No recent falls or fractures  Recovering from acute illness (uti, then uri)   Unsure what treatment options she has given renal fxn  Lab pending-renal and vit D  Discussed fall prevention, supplements and exercise for bone density        Relevant Orders   VITAMIN D  25 Hydroxy (Vit-D Deficiency, Fractures)     Genitourinary   Vaginal itching   S/p antibiotic Suspect yeast Terazol does not  work  Education Officer, Environmental in diflucan  150 mg times one  Update if not starting to improve in a week or if worsening   Call back and Er precautions noted in detail today        CKD (chronic kidney disease) stage 4, GFR 15-29 ml/min (HCC)   Recent uti treated with ceftriaxone  and feels better  Strongly encouraged more fluid intake if not limited by cardiology Lab today  Utd nephrology visit /recent       Relevant Orders   Comprehensive metabolic panel with GFR     Other   Symptomatic anemia   Clinically improved Cbc today      Relevant Orders   CBC with Differential/Platelet   Routine general medical examination at a health care facility - Primary   Reviewed health habits including diet and exercise and skin cancer prevention Reviewed appropriate screening tests for age  Also reviewed health mt list, fam hx and immunization status , as well as social and family history   See HPI Labs reviewed and ordered Health Maintenance  Topic Date Due   Complete foot exam   04/05/2023   Breast Cancer Screening  12/16/2023   COVID-19 Vaccine (4 - 2025-26 season) 02/18/2026*   Zoster (Shingles) Vaccine (1 of 2) 05/04/2026*   Hemoglobin A1C  08/18/2024   Eye exam for diabetics  08/26/2024   Medicare Annual Wellness Visit  04/27/2025   Osteoporosis screening with  Bone Density Scan  12/15/2025   Pneumococcal Vaccine for age over 64  Completed   Flu Shot  Completed   Meningitis B Vaccine  Aged Out   DTaP/Tdap/Td vaccine  Discontinued  *Topic was postponed. The date shown is not the original due date.    Declines shingrix  Out aged colon screen  Discussed fall prevention, supplements and exercise for bone density   PHQ 0       History of GI bleed   Back on xarelto  Stressed importance of nsaid avoidance especially asa       H/O: stroke   Small vessel in past  No new symptoms       Current use of proton pump inhibitor   D and B12 levels today  Continues to require protonix  40 mg bid       Relevant Orders   Vitamin B12      [1]  Social History Tobacco Use   Smoking status: Never   Smokeless tobacco: Never  Vaping Use   Vaping status: Never Used  Substance Use Topics   Alcohol use: No    Alcohol/week: 0.0 standard drinks of alcohol   Drug use: No  [2]  Allergies Allergen Reactions   Ace Inhibitors Swelling    REACTION: tongue swelling   Lisinopril Swelling    Swelling of tongue   Insulin  Detemir Itching   Nsaids Other (See Comments)    Renal issues   Codeine Nausea Only   Hydrochlorothiazide Other (See Comments)    Dizziness/funny feeling   Tylenol  [Acetaminophen ] Other (See Comments)    Fatty liver - use with caution  [3]  Current Outpatient Medications on File Prior to Visit  Medication Sig Dispense Refill   amLODipine  (NORVASC ) 2.5 MG tablet Take 2.5 mg by mouth at bedtime.     ascorbic acid (VITAMIN C) 500 MG tablet Take 500 mg by mouth daily.     atorvastatin  (LIPITOR ) 80 MG tablet Take 1 tablet by mouth once daily 90 tablet 3   carvedilol  (COREG ) 12.5 MG tablet TAKE  1 TABLET BY MOUTH TWICE DAILY WITH A MEAL 180 tablet 3   cetirizine (ZYRTEC) 10 MG tablet Take 10 mg by mouth daily.     Continuous Glucose Sensor (FREESTYLE LIBRE 3 SENSOR) MISC USE TO CHECK GLUCOSE CONTINOUSLY. CHANGE EVERY 14 DAYS 6 each 0    dapagliflozin  propanediol (FARXIGA ) 5 MG TABS tablet Take 5 mg by mouth daily.     famotidine  (PEPCID ) 20 MG tablet Take 1 tablet by mouth once daily 90 tablet 0   gabapentin  (NEURONTIN ) 300 MG capsule TAKE 1 CAPSULE BY MOUTH THREE TIMES DAILY 270 capsule 0   hydrocortisone  2.5 % cream Apply topically 3 (three) times daily as needed. 28 g 3   insulin  degludec (TRESIBA) 100 UNIT/ML FlexTouch Pen Inject 26 Units into the skin at bedtime.     isosorbide  mononitrate (IMDUR ) 30 MG 24 hr tablet Take 1 tablet (30 mg total) by mouth daily. 90 tablet 3   LORazepam  (ATIVAN ) 0.5 MG tablet Take 1 tablet by mouth every 8 (eight) hours as needed.     nystatin cream (MYCOSTATIN) Apply 1 application topically 2 (two) times daily as needed (irritation.).      ondansetron  (ZOFRAN ) 4 MG tablet Take 1 tablet (4 mg total) by mouth every 8 (eight) hours as needed for nausea or vomiting. Caution of sedation 12 tablet 0   pantoprazole  (PROTONIX ) 40 MG tablet Take 1 tablet (40 mg total) by mouth 2 (two) times daily before a meal. 90 tablet 3   Respiratory Therapy Supplies (NEBULIZER/TUBING/MOUTHPIECE) KIT 1 each by Does not apply route 3 (three) times daily as needed. 1 kit 0   Rivaroxaban (XARELTO) 15 MG TABS tablet Take 15 mg by mouth daily with supper.     traMADol  (ULTRAM ) 50 MG tablet Take 1-2 tablets (50-100 mg total) by mouth every 6 (six) hours as needed for severe pain (pain score 7-10). 60 tablet 0   triamcinolone  cream (KENALOG ) 0.1 % Apply 1 Application topically 2 (two) times daily as needed. 45 g 1   TRULICITY 1.5 MG/0.5ML SOAJ Inject into the skin once a week.     vitamin B-12 (CYANOCOBALAMIN ) 1000 MCG tablet Take 500 mcg by mouth daily.     furosemide  (LASIX ) 20 MG tablet Take 1 tablet (20 mg total) by mouth daily as needed. (Patient not taking: Reported on 06/07/2024)     TURMERIC PO Take 1,000 mg by mouth daily. (Patient not taking: Reported on 06/07/2024)     No current facility-administered medications  on file prior to visit.   "

## 2024-06-07 NOTE — Assessment & Plan Note (Signed)
 Clinically improved Cbc today

## 2024-06-07 NOTE — Assessment & Plan Note (Signed)
 Small vessel in past  No new symptoms

## 2024-06-07 NOTE — Assessment & Plan Note (Signed)
 No clinical changes Under care of cardiology  Carvedilol  12.5 mg bid

## 2024-06-07 NOTE — Assessment & Plan Note (Signed)
Continues gabapentin.  

## 2024-06-07 NOTE — Assessment & Plan Note (Signed)
 bp in fair control at this time  BP Readings from Last 1 Encounters:  06/07/24 118/62   No changes needed Most recent labs reviewed  Disc lifstyle change with low sodium diet and exercise  Low gfr   Carvedilol  12.5 mg bid Isosorbide  30 mg daily  Lasix  20 mg daily (change to prn)  Amlodipione 2.5 mg daily  Blood pressure has been labile in the past

## 2024-06-07 NOTE — Assessment & Plan Note (Signed)
 Lab today.

## 2024-06-07 NOTE — Assessment & Plan Note (Signed)
 Disc goals for lipids and reasons to control them Rev last labs with pt Rev low sat fat diet in detail Continues atorvastatin  80 mg daily   Lab today

## 2024-06-07 NOTE — Assessment & Plan Note (Signed)
 Under cardiology and EP care  With CHF On xarelto

## 2024-06-07 NOTE — Assessment & Plan Note (Signed)
 Recent uti treated with ceftriaxone  and feels better  Strongly encouraged more fluid intake if not limited by cardiology Lab today  Utd nephrology visit [facility]

## 2024-06-07 NOTE — Assessment & Plan Note (Signed)
 Back on xarelto  Stressed importance of nsaid avoidance especially asa

## 2024-06-07 NOTE — Assessment & Plan Note (Signed)
 S/p antibiotic Suspect yeast Terazol does not work  Education Officer, Environmental in diflucan  150 mg times one  Update if not starting to improve in a week or if worsening   Call back and Er precautions noted in detail today

## 2024-06-07 NOTE — Assessment & Plan Note (Signed)
 Reviewed health habits including diet and exercise and skin cancer prevention Reviewed appropriate screening tests for age  Also reviewed health mt list, fam hx and immunization status , as well as social and family history   See HPI Labs reviewed and ordered Health Maintenance  Topic Date Due   Complete foot exam   04/05/2023   Breast Cancer Screening  12/16/2023   COVID-19 Vaccine (4 - 2025-26 season) 02/18/2026*   Zoster (Shingles) Vaccine (1 of 2) 05/04/2026*   Hemoglobin A1C  08/18/2024   Eye exam for diabetics  08/26/2024   Medicare Annual Wellness Visit  04/27/2025   Osteoporosis screening with Bone Density Scan  12/15/2025   Pneumococcal Vaccine for age over 51  Completed   Flu Shot  Completed   Meningitis B Vaccine  Aged Out   DTaP/Tdap/Td vaccine  Discontinued  *Topic was postponed. The date shown is not the original due date.    Declines shingrix  Out aged colon screen  Discussed fall prevention, supplements and exercise for bone density   PHQ 0

## 2024-06-22 NOTE — Telephone Encounter (Signed)
 Called patient reviewed all information and repeated back to me. Will call if any questions.  I have set up lab appointment.  She will let us  know if no call from pharmacy

## 2024-06-22 NOTE — Telephone Encounter (Signed)
 I put in pharmacy referral -please let her know    Lab orders are in  Please schedule lab appointment  Thanks

## 2024-06-23 ENCOUNTER — Other Ambulatory Visit: Payer: Self-pay | Admitting: Medical Genetics

## 2024-06-23 ENCOUNTER — Encounter: Payer: Self-pay | Admitting: Medical Genetics

## 2024-06-23 DIAGNOSIS — Z006 Encounter for examination for normal comparison and control in clinical research program: Secondary | ICD-10-CM

## 2024-06-23 NOTE — Research (Unsigned)
 Study: [facility]   Subject Name: Madgeline Rayo [facility]   Participant MARSHIA TROPEA [phone]) was identified as a potential candidate for the above listed study. The informed consent documents were provided to the participant mail.   The patient was asked the eligibility questions. Confirmed the participant met eligibility criteria.   A copy of the informed consent form was provided to the subject for review. The patient was provided the option of taking the informed consent documents to review and was encouraged to review at their convenience with their support network, including other care providers.    The patient was provided with the informed consent questionnaire to demonstrate understanding of the informed consent form. The patient was provided with the correct answers to the informed consent questionnaire.    All the participants questions and concerns regarding participation in the study and the informed consent were addressed to the patient's satisfaction.    The patient has agreed to participate in the above-listed research study and has voluntarily signed the informed consent form [[facility] Version 5, HRN Version 8]. The patient was provided with a copy of the signed informed consent form for their reference.   The informed consent was obtained prior to performance of any protocol-specific procedures for the subject. A copy of the signed informed consent will be scanned in the subject's medical record.       Noemi Bimler, Ph.D. [facility]   [facility] Clinical Research Coordinator Direct Dial: [phone]

## 2024-06-25 ENCOUNTER — Telehealth: Payer: Self-pay

## 2024-06-25 ENCOUNTER — Other Ambulatory Visit

## 2024-06-25 DIAGNOSIS — D649 Anemia, unspecified: Secondary | ICD-10-CM

## 2024-06-25 LAB — CBC WITH DIFFERENTIAL/PLATELET
Basophils Absolute: 0 K/uL (ref 0.0–0.1)
Basophils Relative: 0.5 % (ref 0.0–3.0)
Eosinophils Absolute: 0.6 K/uL (ref 0.0–0.7)
Eosinophils Relative: 6.7 % — ABNORMAL HIGH (ref 0.0–5.0)
HCT: 36.6 % (ref 36.0–46.0)
Hemoglobin: 11.9 g/dL — ABNORMAL LOW (ref 12.0–15.0)
Lymphocytes Relative: 29.9 % (ref 12.0–46.0)
Lymphs Abs: 2.5 K/uL (ref 0.7–4.0)
MCHC: 32.6 g/dL (ref 30.0–36.0)
MCV: 84.9 fl (ref 78.0–100.0)
Monocytes Absolute: 0.6 K/uL (ref 0.1–1.0)
Monocytes Relative: 7.5 % (ref 3.0–12.0)
Neutro Abs: 4.6 K/uL (ref 1.4–7.7)
Neutrophils Relative %: 55.4 % (ref 43.0–77.0)
Platelets: 210 K/uL (ref 150.0–400.0)
RBC: 4.31 Mil/uL (ref 3.87–5.11)
RDW: 15.9 % — ABNORMAL HIGH (ref 11.5–15.5)
WBC: 8.4 K/uL (ref 4.0–10.5)

## 2024-06-25 LAB — IRON: Iron: 81 ug/dL (ref 42–145)

## 2024-06-25 LAB — FERRITIN: Ferritin: 15 ng/mL (ref 10.0–291.0)

## 2024-06-25 NOTE — Progress Notes (Signed)
 Complex Care Management Note  Care Guide Note 06/25/2024 Name: Morgan Hubbard MRN: [id number] DOB: 19-Nov-1934  Morgan Hubbard is a 89 y.o. year old female who sees Tower, Laine LABOR, MD for primary care. I reached out to [employer] by phone today to offer complex care management services.  Morgan Hubbard was given information about Complex Care Management services today including:   The Complex Care Management services include support from the care team which includes your Nurse Care Manager, Clinical Social Worker, or Pharmacist.  The Complex Care Management team is here to help remove barriers to the health concerns and goals most important to you. Complex Care Management services are voluntary, and the patient may decline or stop services at any time by request to their care team member.   Complex Care Management Consent Status: Patient agreed to services and verbal consent obtained.   Follow up plan:  Telephone appointment with complex care management team member scheduled for:  07/07/24 at 12:30 p.m.   Encounter Outcome:  Patient Scheduled  Morgan Hubbard [facility]  [facility] VBCI Assistant Direct Dial: [phone]  Fax: [phone]

## 2024-06-27 ENCOUNTER — Ambulatory Visit: Payer: Self-pay | Admitting: Family Medicine

## 2024-06-27 DIAGNOSIS — D500 Iron deficiency anemia secondary to blood loss (chronic): Secondary | ICD-10-CM

## 2024-06-27 NOTE — Assessment & Plan Note (Signed)
 In setting of angioplastic colon lesion/ GI bleed in past   Hgb trended down Back on iron  now

## 2024-06-28 ENCOUNTER — Telehealth: Payer: Self-pay | Admitting: *Deleted

## 2024-06-28 NOTE — Telephone Encounter (Signed)
 Pt has viewed labs via mychart but per Dr. Randeen:  Re check labs in a month (non fasting)   Plan follow up with me if you can in march   **please schedule both appts 1 month labs/ f/u with PCP in March** Thanks

## 2024-06-30 ENCOUNTER — Other Ambulatory Visit: Payer: Self-pay | Admitting: Family Medicine

## 2024-06-30 DIAGNOSIS — N3946 Mixed incontinence: Secondary | ICD-10-CM

## 2024-06-30 DIAGNOSIS — N318 Other neuromuscular dysfunction of bladder: Secondary | ICD-10-CM

## 2024-07-02 ENCOUNTER — Other Ambulatory Visit: Payer: Self-pay | Admitting: Family Medicine

## 2024-07-02 DIAGNOSIS — N318 Other neuromuscular dysfunction of bladder: Secondary | ICD-10-CM

## 2024-07-02 DIAGNOSIS — N3946 Mixed incontinence: Secondary | ICD-10-CM

## 2024-07-02 NOTE — Telephone Encounter (Signed)
 Copied from CRM #[id number]. Topic: Clinical - Medication Question >> Jul 02, 2024  1:57 PM Morgan Hubbard wrote: Reason for CRM: Patient called in stating that she is waiting on 2 medications to get called in, famotidine  (PEPCID ) 20 MG tablet and trospium  chloride. Agent relayed to patient that the pharmacy did submit a request today, but patient is hoping these can get called in today before the snow storm.

## 2024-07-02 NOTE — Telephone Encounter (Signed)
 Will route to PCP for review, it looks like Manuelita (pharmacy) d/c Trospium  off of med list but see prev note, pt is requesting refill

## 2024-07-02 NOTE — Telephone Encounter (Signed)
 I declined the trospium  but let me know if she thinks that is wrong

## 2024-07-02 NOTE — Telephone Encounter (Signed)
 Left VM requesting pt to call the office back   **E2C2 IF PT CALLS BACK PLEASE ASK HER IF SHE'S TAKING SANCTURA /TROSPIUM , PER LINDSAY (PHARMACIST) MED WAS D/C DUE TO SIDE EFFECTS**

## 2024-07-02 NOTE — Telephone Encounter (Signed)
 Looks like the trospium  was d/c by Manuelita due to side effects, is that correct?

## 2024-07-06 MED ORDER — TROSPIUM CHLORIDE 20 MG PO TABS: 20.0000 mg | ORAL_TABLET | Freq: Every day | ORAL | 1 refills | Status: AC

## 2024-07-06 NOTE — Telephone Encounter (Signed)
 I sent it (generic sanctura )  Let us  know if any side effects or if not effective

## 2024-07-06 NOTE — Telephone Encounter (Signed)
 Copied from CRM #[id number]. Topic: Clinical - Prescription Issue >> Jul 05, 2024  4:27 PM Roselie BROCKS wrote: Reason for CRM: Patient returned call , patient  states she is still taking Trospium  and requests a return call regarding this.

## 2024-07-07 ENCOUNTER — Other Ambulatory Visit

## 2024-07-07 ENCOUNTER — Other Ambulatory Visit: Admitting: Pharmacist

## 2024-07-07 DIAGNOSIS — I428 Other cardiomyopathies: Secondary | ICD-10-CM

## 2024-07-07 NOTE — Progress Notes (Signed)
" ° °  07/07/2024 Name: Morgan Hubbard MRN: [id number] DOB: 1934/07/08  Subjective  Chief Complaint  Patient presents with   Cardiomyopathy   Medication Access   Care Team: Primary Care Provider: Tower, Laine LABOR, MD  Reason for visit: ?  Morgan Hubbard is a 89 y.o. female who presents today for a telephone visit with the pharmacist due to medication access concerns regarding their Farxiga  and Xarelto. ?   Medication Access: ?  Brand medications: Farxiga  5 mg Xarelto 15 mg - not taking kills my appetite Tresiba (Price capped at $35/month by Medicare)   Prescription drug coverage: YES AETNA - AETNA-[place] MAPD H5521/SIGNATURE  Summary of Benefit: [email or web address] Rx Deductible: [phone] Generics $0 Preferred brand = 24% coinsurance   Current Patient Assistance: Novo Nordisk Lelon, unclear if successfully re-enrolled for 2026)   Patient lives in a household of 2 with an estimated combined annual income of 337% [employer]. via [employer].  Medicare LIS Eligible: No, Income exceeded   Assessment and Plan:   1. Medication Access  Xarelto, Farxiga : Assistance available - [employer] Trulicity: (Stopped taking, has f/u with Endo soon and plans to discuss w them). Income too high to qualify for assistance Patient has elected for a very high deductible Medicare plan this year with a high co-insurance, meaning all brand medications will be unaffordable for the remainder of the year.  Discussed she has until end of March to elect for any other Medicare Advantage plan that has better prescription coverage.  Income is too high to [employer] for manufacturer assistance for [employer] - Cardiomyopathy   Medication(s): All cardiomyopathy medications (now including DOACs) Application Status:  Approved for re-enrollment    HealthWell ID:  [id number] Fund: Hypercholesterolemia - Medicare Access Assistance Type: Co-pay Start Date: 06/07/2024 End Date: 06/06/2025               Rx Card: Card No.  [id number] RX BIN:  [id number] PCN:  [id number] Diagnosis: I42.8 Group:  [id number]   Morgan Hubbard, PharmD, BCACP, CPP Clinical Pharmacist Practitioner [facility] Ph: [phone]  "

## 2024-07-07 NOTE — Patient Instructions (Addendum)
 [name],   It was a pleasure to speak with you today! As we discussed:?   You have been enrolled in the [employer] Cardiomyopathy (heart failure) [place]. This grant helps Medicare patients pay for their heart medications at the pharmacy.   This grant should help cover the cost of Farxiga  and Xarelto.   Your HealthWell ID: [id number]  [employer] Foundation: Website: [email or web address]/ Phone: [phone] M-F 9am-5pm   This program works similar to a biochemist, clinical card or insurance card. Your pharmacy will continue to bill your heart medicine through Medicare, then they will bill the HealthWell savings card to cover your copay, so you should not have to pay anything.   Your HealthWell savings card is electronic (not a physical card).  Please provide the following details to your pharmacist:   Your HealthWell Savings Card Numbers:            Rx Card: Card No.  [id number] RX BIN:  [id number] PCN:  [id number] Diagnosis: I42.8 Group:  [id number]  Start Date: 06/07/2024 End Date: 06/06/2025   Thank you!   [name], PharmD, BCACP, CPP Clinical Pharmacist Practitioner [facility] Ph: [phone]

## 2024-07-08 ENCOUNTER — Telehealth: Payer: Self-pay | Admitting: Family Medicine

## 2024-07-08 NOTE — Telephone Encounter (Signed)
 Called  pt and schedule appt for ov / labs

## 2024-07-08 NOTE — Telephone Encounter (Signed)
 error

## 2024-08-05 ENCOUNTER — Other Ambulatory Visit

## 2024-08-05 ENCOUNTER — Ambulatory Visit: Admitting: Family Medicine

## 2024-08-27 ENCOUNTER — Ambulatory Visit: Admitting: Neurology
